# Patient Record
Sex: Male | Born: 1970 | Race: White | Hispanic: No | Marital: Married | Smoking: Current some day smoker
Health system: Southern US, Community
[De-identification: ages and names within clinical notes are randomized; demographics above are authoritative.]

## PROBLEM LIST (undated history)

## (undated) DIAGNOSIS — G8929 Other chronic pain: Secondary | ICD-10-CM

## (undated) DIAGNOSIS — C649 Malignant neoplasm of unspecified kidney, except renal pelvis: Secondary | ICD-10-CM

## (undated) DIAGNOSIS — T8859XA Other complications of anesthesia, initial encounter: Secondary | ICD-10-CM

## (undated) DIAGNOSIS — I442 Atrioventricular block, complete: Secondary | ICD-10-CM

## (undated) DIAGNOSIS — I96 Gangrene, not elsewhere classified: Secondary | ICD-10-CM

## (undated) DIAGNOSIS — E119 Type 2 diabetes mellitus without complications: Secondary | ICD-10-CM

## (undated) DIAGNOSIS — N186 End stage renal disease: Secondary | ICD-10-CM

## (undated) DIAGNOSIS — I110 Hypertensive heart disease with heart failure: Secondary | ICD-10-CM

## (undated) DIAGNOSIS — Z992 Dependence on renal dialysis: Secondary | ICD-10-CM

## (undated) DIAGNOSIS — E785 Hyperlipidemia, unspecified: Secondary | ICD-10-CM

## (undated) DIAGNOSIS — T4145XA Adverse effect of unspecified anesthetic, initial encounter: Secondary | ICD-10-CM

## (undated) DIAGNOSIS — M549 Dorsalgia, unspecified: Secondary | ICD-10-CM

## (undated) DIAGNOSIS — I5032 Chronic diastolic (congestive) heart failure: Secondary | ICD-10-CM

## (undated) DIAGNOSIS — I4892 Unspecified atrial flutter: Secondary | ICD-10-CM

## (undated) DIAGNOSIS — I639 Cerebral infarction, unspecified: Secondary | ICD-10-CM

## (undated) DIAGNOSIS — D649 Anemia, unspecified: Secondary | ICD-10-CM

## (undated) DIAGNOSIS — F319 Bipolar disorder, unspecified: Secondary | ICD-10-CM

## (undated) DIAGNOSIS — I350 Nonrheumatic aortic (valve) stenosis: Secondary | ICD-10-CM

## (undated) DIAGNOSIS — E669 Obesity, unspecified: Secondary | ICD-10-CM

## (undated) DIAGNOSIS — R569 Unspecified convulsions: Secondary | ICD-10-CM

## (undated) HISTORY — PX: NM MYOVIEW LTD: HXRAD82

## (undated) HISTORY — DX: Nonrheumatic aortic (valve) stenosis: I35.0

## (undated) HISTORY — DX: Hypertensive heart disease with heart failure: I11.0

## (undated) HISTORY — DX: Hyperlipidemia, unspecified: E78.5

## (undated) HISTORY — PX: HERNIA REPAIR: SHX51

## (undated) HISTORY — PX: TONSILLECTOMY AND ADENOIDECTOMY: SUR1326

## (undated) HISTORY — PX: TRANSTHORACIC ECHOCARDIOGRAM: SHX275

## (undated) HISTORY — PX: TESTICLE TORSION REDUCTION: SHX795

## (undated) HISTORY — DX: Chronic diastolic (congestive) heart failure: I50.32

## (undated) HISTORY — DX: Anemia, unspecified: D64.9

---
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
## 1999-09-05 ENCOUNTER — Emergency Department (HOSPITAL_COMMUNITY): Admission: EM | Admit: 1999-09-05 | Discharge: 1999-09-05 | Payer: Self-pay | Admitting: *Deleted

## 2000-11-02 ENCOUNTER — Emergency Department (HOSPITAL_COMMUNITY): Admission: EM | Admit: 2000-11-02 | Discharge: 2000-11-02 | Payer: Self-pay | Admitting: Emergency Medicine

## 2003-09-05 ENCOUNTER — Encounter (INDEPENDENT_AMBULATORY_CARE_PROVIDER_SITE_OTHER): Payer: Self-pay | Admitting: *Deleted

## 2003-09-05 ENCOUNTER — Ambulatory Visit (HOSPITAL_COMMUNITY): Admission: RE | Admit: 2003-09-05 | Discharge: 2003-09-05 | Payer: Self-pay

## 2003-09-05 ENCOUNTER — Ambulatory Visit (HOSPITAL_BASED_OUTPATIENT_CLINIC_OR_DEPARTMENT_OTHER): Admission: RE | Admit: 2003-09-05 | Discharge: 2003-09-05 | Payer: Self-pay

## 2003-09-05 ENCOUNTER — Encounter (INDEPENDENT_AMBULATORY_CARE_PROVIDER_SITE_OTHER): Payer: Self-pay

## 2005-12-01 ENCOUNTER — Emergency Department (HOSPITAL_COMMUNITY): Admission: EM | Admit: 2005-12-01 | Discharge: 2005-12-01 | Payer: Self-pay | Admitting: Emergency Medicine

## 2006-01-10 ENCOUNTER — Emergency Department (HOSPITAL_COMMUNITY): Admission: EM | Admit: 2006-01-10 | Discharge: 2006-01-10 | Payer: Self-pay | Admitting: Emergency Medicine

## 2006-07-01 HISTORY — PX: NEPHRECTOMY: SHX65

## 2006-10-13 ENCOUNTER — Ambulatory Visit: Payer: Self-pay | Admitting: Licensed Clinical Social Worker

## 2006-12-21 ENCOUNTER — Emergency Department (HOSPITAL_COMMUNITY): Admission: EM | Admit: 2006-12-21 | Discharge: 2006-12-21 | Payer: Self-pay | Admitting: Emergency Medicine

## 2006-12-22 ENCOUNTER — Inpatient Hospital Stay (HOSPITAL_COMMUNITY): Admission: EM | Admit: 2006-12-22 | Discharge: 2006-12-24 | Payer: Self-pay | Admitting: Emergency Medicine

## 2006-12-22 ENCOUNTER — Encounter: Payer: Self-pay | Admitting: *Deleted

## 2007-03-26 ENCOUNTER — Encounter (INDEPENDENT_AMBULATORY_CARE_PROVIDER_SITE_OTHER): Payer: Self-pay | Admitting: Urology

## 2007-03-26 ENCOUNTER — Inpatient Hospital Stay (HOSPITAL_COMMUNITY): Admission: RE | Admit: 2007-03-26 | Discharge: 2007-03-28 | Payer: Self-pay | Admitting: Urology

## 2008-01-24 ENCOUNTER — Emergency Department (HOSPITAL_COMMUNITY): Admission: EM | Admit: 2008-01-24 | Discharge: 2008-01-25 | Payer: Self-pay | Admitting: Emergency Medicine

## 2008-07-01 DIAGNOSIS — I110 Hypertensive heart disease with heart failure: Secondary | ICD-10-CM

## 2008-07-01 DIAGNOSIS — I5032 Chronic diastolic (congestive) heart failure: Secondary | ICD-10-CM

## 2008-07-01 HISTORY — DX: Chronic diastolic (congestive) heart failure: I50.32

## 2008-07-01 HISTORY — DX: Hypertensive heart disease with heart failure: I11.0

## 2008-12-20 ENCOUNTER — Ambulatory Visit: Payer: Self-pay | Admitting: Cardiovascular Disease

## 2008-12-20 ENCOUNTER — Encounter (INDEPENDENT_AMBULATORY_CARE_PROVIDER_SITE_OTHER): Payer: Self-pay | Admitting: Emergency Medicine

## 2008-12-20 ENCOUNTER — Inpatient Hospital Stay (HOSPITAL_COMMUNITY): Admission: EM | Admit: 2008-12-20 | Discharge: 2008-12-23 | Payer: Self-pay | Admitting: Emergency Medicine

## 2009-01-24 ENCOUNTER — Emergency Department (HOSPITAL_COMMUNITY): Admission: EM | Admit: 2009-01-24 | Discharge: 2009-01-24 | Payer: Self-pay | Admitting: Family Medicine

## 2010-01-29 ENCOUNTER — Emergency Department (HOSPITAL_COMMUNITY): Admission: EM | Admit: 2010-01-29 | Discharge: 2010-01-29 | Payer: Self-pay | Admitting: Family Medicine

## 2010-09-14 LAB — POCT URINALYSIS DIPSTICK
Ketones, ur: NEGATIVE mg/dL
pH: 5.5 (ref 5.0–8.0)

## 2010-10-08 LAB — BASIC METABOLIC PANEL
BUN: 18 mg/dL (ref 6–23)
CO2: 31 mEq/L (ref 19–32)
Calcium: 8.4 mg/dL (ref 8.4–10.5)
Calcium: 9 mg/dL (ref 8.4–10.5)
Chloride: 100 mEq/L (ref 96–112)
Creatinine, Ser: 1.47 mg/dL (ref 0.4–1.5)
GFR calc Af Amer: >60 mL/min (ref >60)
GFR calc Af Amer: >60 mL/min (ref >60)
GFR calc non Af Amer: 53 mL/min — ABNORMAL LOW (ref >60)
GFR calc non Af Amer: 54 mL/min — ABNORMAL LOW (ref >60)
Glucose, Bld: 131 mg/dL — ABNORMAL HIGH (ref 70–99)
Glucose, Bld: 196 mg/dL — ABNORMAL HIGH (ref 70–99)
Potassium: 3.1 mEq/L — ABNORMAL LOW (ref 3.5–5.1)
Potassium: 3.7 mEq/L (ref 3.5–5.1)
Sodium: 136 mEq/L (ref 135–145)
Sodium: 139 mEq/L (ref 135–145)

## 2010-10-08 LAB — GLUCOSE, CAPILLARY
Glucose-Capillary: 126 mg/dL — ABNORMAL HIGH (ref 70–99)
Glucose-Capillary: 129 mg/dL — ABNORMAL HIGH (ref 70–99)
Glucose-Capillary: 132 mg/dL — ABNORMAL HIGH (ref 70–99)
Glucose-Capillary: 132 mg/dL — ABNORMAL HIGH (ref 70–99)
Glucose-Capillary: 134 mg/dL — ABNORMAL HIGH (ref 70–99)
Glucose-Capillary: 134 mg/dL — ABNORMAL HIGH (ref 70–99)
Glucose-Capillary: 137 mg/dL — ABNORMAL HIGH (ref 70–99)
Glucose-Capillary: 139 mg/dL — ABNORMAL HIGH (ref 70–99)
Glucose-Capillary: 139 mg/dL — ABNORMAL HIGH (ref 70–99)
Glucose-Capillary: 140 mg/dL — ABNORMAL HIGH (ref 70–99)
Glucose-Capillary: 145 mg/dL — ABNORMAL HIGH (ref 70–99)
Glucose-Capillary: 146 mg/dL — ABNORMAL HIGH (ref 70–99)
Glucose-Capillary: 149 mg/dL — ABNORMAL HIGH (ref 70–99)
Glucose-Capillary: 156 mg/dL — ABNORMAL HIGH (ref 70–99)
Glucose-Capillary: 157 mg/dL — ABNORMAL HIGH (ref 70–99)
Glucose-Capillary: 161 mg/dL — ABNORMAL HIGH (ref 70–99)
Glucose-Capillary: 172 mg/dL — ABNORMAL HIGH (ref 70–99)
Glucose-Capillary: 173 mg/dL — ABNORMAL HIGH (ref 70–99)
Glucose-Capillary: 184 mg/dL — ABNORMAL HIGH (ref 70–99)
Glucose-Capillary: 184 mg/dL — ABNORMAL HIGH (ref 70–99)
Glucose-Capillary: 205 mg/dL — ABNORMAL HIGH (ref 70–99)
Glucose-Capillary: 207 mg/dL — ABNORMAL HIGH (ref 70–99)
Glucose-Capillary: 244 mg/dL — ABNORMAL HIGH (ref 70–99)
Glucose-Capillary: 245 mg/dL — ABNORMAL HIGH (ref 70–99)
Glucose-Capillary: 264 mg/dL — ABNORMAL HIGH (ref 70–99)
Glucose-Capillary: 265 mg/dL — ABNORMAL HIGH (ref 70–99)
Glucose-Capillary: 275 mg/dL — ABNORMAL HIGH (ref 70–99)
Glucose-Capillary: 300 mg/dL — ABNORMAL HIGH (ref 70–99)
Glucose-Capillary: 301 mg/dL — ABNORMAL HIGH (ref 70–99)
Glucose-Capillary: 307 mg/dL — ABNORMAL HIGH (ref 70–99)
Glucose-Capillary: 308 mg/dL — ABNORMAL HIGH (ref 70–99)
Glucose-Capillary: 320 mg/dL — ABNORMAL HIGH (ref 70–99)
Glucose-Capillary: 340 mg/dL — ABNORMAL HIGH (ref 70–99)
Glucose-Capillary: 372 mg/dL — ABNORMAL HIGH (ref 70–99)
Glucose-Capillary: 398 mg/dL — ABNORMAL HIGH (ref 70–99)
Glucose-Capillary: 416 mg/dL — ABNORMAL HIGH (ref 70–99)
Glucose-Capillary: 462 mg/dL — ABNORMAL HIGH (ref 70–99)
Glucose-Capillary: 477 mg/dL — ABNORMAL HIGH (ref 70–99)

## 2010-10-08 LAB — CBC
HCT: 38.5 % — ABNORMAL LOW (ref 39.0–52.0)
HCT: 39.6 % (ref 39.0–52.0)
Hemoglobin: 13.1 g/dL (ref 13.0–17.0)
Hemoglobin: 13.4 g/dL (ref 13.0–17.0)
Hemoglobin: 13.5 g/dL (ref 13.0–17.0)
Hemoglobin: 14.5 g/dL (ref 13.0–17.0)
MCHC: 33.9 g/dL (ref 30.0–36.0)
MCHC: 33.9 g/dL (ref 30.0–36.0)
MCHC: 34.4 g/dL (ref 30.0–36.0)
MCV: 84.9 fL (ref 78.0–100.0)
MCV: 85 fL (ref 78.0–100.0)
MCV: 85.8 fL (ref 78.0–100.0)
Platelets: 174 10*3/uL (ref 150–400)
Platelets: 183 10*3/uL (ref 150–400)
RBC: 4.53 MIL/uL (ref 4.22–5.81)
RBC: 4.61 MIL/uL (ref 4.22–5.81)
RBC: 4.65 MIL/uL (ref 4.22–5.81)
RDW: 13.6 % (ref 11.5–15.5)
RDW: 14.1 % (ref 11.5–15.5)
RDW: 14.1 % (ref 11.5–15.5)
WBC: 8.2 10*3/uL (ref 4.0–10.5)
WBC: 9.8 10*3/uL (ref 4.0–10.5)

## 2010-10-08 LAB — COMPREHENSIVE METABOLIC PANEL
ALT: 31 U/L (ref 0–53)
ALT: 32 U/L (ref 0–53)
AST: 17 U/L (ref 0–37)
AST: 28 U/L (ref 0–37)
Albumin: 3.2 g/dL — ABNORMAL LOW (ref 3.5–5.2)
Alkaline Phosphatase: 80 U/L (ref 39–117)
Alkaline Phosphatase: 82 U/L (ref 39–117)
BUN: 18 mg/dL (ref 6–23)
CO2: 28 mEq/L (ref 19–32)
CO2: 33 mEq/L — ABNORMAL HIGH (ref 19–32)
Calcium: 8.6 mg/dL (ref 8.4–10.5)
Chloride: 98 mEq/L (ref 96–112)
Creatinine, Ser: 1.49 mg/dL (ref 0.4–1.5)
GFR calc Af Amer: >60 mL/min (ref >60)
GFR calc Af Amer: >60 mL/min (ref >60)
GFR calc non Af Amer: 53 mL/min — ABNORMAL LOW (ref >60)
Glucose, Bld: 242 mg/dL — ABNORMAL HIGH (ref 70–99)
Glucose, Bld: 445 mg/dL — ABNORMAL HIGH (ref 70–99)
Potassium: 3.2 mEq/L — ABNORMAL LOW (ref 3.5–5.1)
Potassium: 4.8 mEq/L (ref 3.5–5.1)
Sodium: 134 mEq/L — ABNORMAL LOW (ref 135–145)
Sodium: 139 mEq/L (ref 135–145)
Total Bilirubin: 1.1 mg/dL (ref 0.3–1.2)
Total Protein: 5.7 g/dL — ABNORMAL LOW (ref 6.0–8.3)
Total Protein: 6.1 g/dL (ref 6.0–8.3)

## 2010-10-08 LAB — POCT CARDIAC MARKERS
CKMB, poc: 2 ng/mL (ref 1.0–8.0)
CKMB, poc: 2.4 ng/mL (ref 1.0–8.0)
Myoglobin, poc: 75.2 ng/mL (ref 12–200)
Myoglobin, poc: 77.5 ng/mL (ref 12–200)
Troponin i, poc: <0.05 ng/mL (ref 0.00–0.09)
Troponin i, poc: <0.05 ng/mL (ref 0.00–0.09)

## 2010-10-08 LAB — CARDIAC PANEL(CRET KIN+CKTOT+MB+TROPI)
CK, MB: 2.2 ng/mL (ref 0.3–4.0)
CK, MB: 2.4 ng/mL (ref 0.3–4.0)
Relative Index: INVALID (ref 0.0–2.5)
Relative Index: INVALID (ref 0.0–2.5)
Total CK: 41 U/L (ref 7–232)
Total CK: 42 U/L (ref 7–232)
Troponin I: 0.08 ng/mL — ABNORMAL HIGH (ref 0.00–0.06)
Troponin I: 0.09 ng/mL — ABNORMAL HIGH (ref 0.00–0.06)

## 2010-10-08 LAB — POCT I-STAT, CHEM 8
BUN: 22 mg/dL (ref 6–23)
Calcium, Ion: 1.06 mmol/L — ABNORMAL LOW (ref 1.12–1.32)
Chloride: 98 mEq/L (ref 96–112)
Creatinine, Ser: 1.4 mg/dL (ref 0.4–1.5)
Glucose, Bld: 427 mg/dL — ABNORMAL HIGH (ref 70–99)
HCT: 42 % (ref 39.0–52.0)
Hemoglobin: 14.3 g/dL (ref 13.0–17.0)
Potassium: 3.4 mEq/L — ABNORMAL LOW (ref 3.5–5.1)
Sodium: 133 mEq/L — ABNORMAL LOW (ref 135–145)
TCO2: 26 mmol/L (ref 0–100)

## 2010-10-08 LAB — LIPID PANEL
Cholesterol: 250 mg/dL — ABNORMAL HIGH (ref 0–200)
HDL: 37 mg/dL — ABNORMAL LOW (ref >39)
LDL Cholesterol: 150 mg/dL — ABNORMAL HIGH (ref 0–99)
Total CHOL/HDL Ratio: 6.8 RATIO
Triglycerides: 315 mg/dL — ABNORMAL HIGH (ref <150)
VLDL: 63 mg/dL — ABNORMAL HIGH (ref 0–40)

## 2010-10-08 LAB — TROPONIN I: Troponin I: 0.11 ng/mL — ABNORMAL HIGH (ref 0.00–0.06)

## 2010-10-08 LAB — APTT: aPTT: 26 seconds (ref 24–37)

## 2010-10-08 LAB — CK TOTAL AND CKMB (NOT AT ARMC)
CK, MB: 3.2 ng/mL (ref 0.3–4.0)
Relative Index: INVALID (ref 0.0–2.5)
Total CK: 58 U/L (ref 7–232)

## 2010-10-08 LAB — DIFFERENTIAL
Basophils Absolute: 0.1 10*3/uL (ref 0.0–0.1)
Basophils Relative: 1 % (ref 0–1)
Lymphocytes Relative: 24 % (ref 12–46)

## 2010-10-08 LAB — PROTIME-INR
INR: 1 (ref 0.00–1.49)
Prothrombin Time: 12.8 seconds (ref 11.6–15.2)

## 2010-10-08 LAB — HEMOGLOBIN A1C: Hgb A1c MFr Bld: 11.5 % — ABNORMAL HIGH (ref 4.6–6.1)

## 2010-10-08 LAB — TSH: TSH: 2.983 u[IU]/mL (ref 0.350–4.500)
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
## 2010-11-13 NOTE — Discharge Summary (Signed)
NAME:  Hayden [name], Hayden [name] NO.:  [id number]   MEDICAL RECORD NO.:  [id number]          PATIENT TYPE:  INP   LOCATION:  4706                         FACILITY:  [facility]   PHYSICIAN:  Altha Harm, MDDATE OF BIRTH:  1970-10-30   DATE OF ADMISSION:  12/20/2008  DATE OF DISCHARGE:  12/22/2008                               DISCHARGE SUMMARY   DISCHARGE DISPOSITION:  Home.   FINAL DISCHARGE DIAGNOSES:  1. Malignant hypertension, improved.  2. Diabetes type 2, uncontrolled.  3. Hypertensive urgency.  4. Hyperlipidemia.  5. Obesity.  6. Anxiety.  7. Chronic systolic and diastolic congestive heart failure.   DISCHARGE MEDICATIONS:  Include the following:  1. Hydrochlorothiazide 25 mg p.o. daily.  2. Pravastatin 20 mg p.o. every night.  3. Lantus 34 units subcu every night.  4. Metoprolol 100 mg p.o. b.i.d.  5. Potassium chloride 20 mEq p.o. daily.  6. Amaryl 2 mg p.o. daily.  7. Lisinopril 10 mg p.o. daily.   PROCEDURES:  None.   DIAGNOSTIC STUDIES:  1. A two-view chest is created on admission which showed mild      cardiomegaly and mild diffuse interstitial pulmonary edema      consistent with CHF.  2. Echocardiogram which shows left ventricle with mild dilation, an      ejection fraction of 40%-45% with inferolateral hypokinesis and      wall thickness increased in a pattern of moderate LDH.   PROCEDURES:  None.   PERTINENT LABORATORY STUDIES:  Hemoglobin A1c elevated 11.5, total  cholesterol 250, LDL 150, VLDL 63, HDL 37.  At the time of discharge,  patient had a sodium of 139, potassium of 3.7, chloride 98, bicarb 31,  BUN 16, creatinine 1.5.   CHIEF COMPLAINT:  Shortness of breath.   HISTORY OF PRESENT ILLNESS:  Please refer to the H and P dictated by Dr.  Oralia Rud for details of the HPI.   HOSPITAL COURSE:  1. Diabetes type 2, uncontrolled.  The patient was admitted and placed      on an insulin drip.  Medications were titrated until the patient    was able to come off the insulin drip with  blood sugars ranging in      the 100s to 160s.  The patient was then started on Lantus and      NovoLog insulin.  Amaryl was also added for pre-meal control.  The      patient had been on metformin; however, due to his impaired renal      function, the metformin was discontinued.  The patient will need      further titration as an outpatient.  He is to see diabetic      education here in the hospital and instructions on how to      administer his insulin.  The patient has also been given a      prescription for Glucometer and test strips.  He has been      instructed to check his blood sugars in the morning.  2. Hypertensive urgency.  The patient came in with malignant  hypertension and hypertensive urgency with headache and chest pain.      He was ruled out with serial enzymes for resting ischemia and had      medications titrated.  The patient was put on labetalol,      hydrochlorothiazide, lisinopril here in the hospital and is being      discharged on the above-stated medications.  3. Chronic systolic and diastolic dysfunction.  A 2-D echocardiogram      showed the patient had an ejection fraction of 40%-47% and features      consistent with left ventricular hypertrophy.  The patient is put      on the above-stated medications and he will follow up with      cardiologist, Dr. Shana Chute, who he has chosen to be both his primary      care physician and his cardiologist.  4. Hyperlipidemia.  The patient was found to be hyperlipidemic and      started on pravastatin.  Baseline LFTs were drawn, which were      normal at this time.  Otherwise, the patient remains stable in the      hospital.   DIETARY RESTRICTIONS:  The patient should be on a diabetic, low-  cholesterol, heart-healthy diet.   PHYSICAL RESTRICTIONS:  Activity as tolerated.   FOLLOWUP:  The patient is to follow up with Dr. Shana Chute in 1 week.  Number has been provided,  [phone], and the patient has spoken with Dr.  Shana Chute this afternoon.      Altha Harm, MD  Electronically Signed     MAM/MEDQ  D:  12/23/2008  T:  12/23/2008  Job:  [id number]   cc:   Osvaldo Shipper. Spruill, M.D.
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
## 2010-11-13 NOTE — H&P (Signed)
NAME:  Hayden [name], PURK NO.:  [id number]   MEDICAL RECORD NO.:  [id number]          PATIENT TYPE:  INP   LOCATION:  1826                         FACILITY:  [facility]   PHYSICIAN:  Darryl D. Prime, MD    DATE OF BIRTH:  [birth year]   DATE OF ADMISSION:  12/20/2008  DATE OF DISCHARGE:                              HISTORY & PHYSICAL   The patient is full code.   PRIMARY CARE PHYSICIAN:  Erskine Speed, M.D.   CHIEF COMPLAINT:  Shortness of breath.   HISTORY OF PRESENT ILLNESS:  Mr. Daughety is a 40 year old male with a  history of long-standing hypertension at an early age.  He has been  extensively worked up by this by Dr. Chilton Si and was told that this  possibly genetic.  He has a history of diabetes for the last 2 years but  has not been checking his blood sugars.  His blood pressure is labile.  He comes in with complaints of persistent orthopnea, worse during the  night.  The symptoms were so bad tonight he had to come in.  He denies  any chest pain, recent fever, cough.  He denies any weight gain, denies  any lower extremity edema but the lower extremity edema is visible.  The  patient has taken nothing for this, he just decided to come in.   In the emergency room, he was found to have blood pressures in the range  of 228/137.  Lasix IV was given as well as nitroglycerin drip.  The  patient's blood sugars were in the range of 477 and Glucommander was  started.   PAST MEDICAL HISTORY:  As above.  He has a history of:  1. Hyperlipidemia.  2. Depression.  3. 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  He is      status post partial nephrectomy in September 2008 for that.  4. History of testicular torsion, status post bilateral orchidopexy at      age 12.  5. History of inguinal hernia repair.  6. History of left supraclavicular lymph node removal, found to be      benign.   ALLERGIES:  DILAUDID AND MORPHINE which cause delirium.   MEDICATIONS:  1. Metformin 500 mg twice a day.  2. Diovan/hydrochlorothiazide, unsure of dose.  3. Cardura, unsure of dose.  4. Xanax 0.25 mg twice a day p.r.n.   SOCIAL HISTORY:  The patient is married, has 2 children, including a son  and daughter.  He works in English as a second language teacher for [employer].  Has smoked at least 2 packs per day for about 9 years. No alcohol use.   FAMILY HISTORY:  Positive for multiple uncles with uncontrolled  hypertension on his mother's side.  One uncle on his mother's side at  age 17 had a myocardial infarction and one at age 57 had a myocardial  infarction.   REVIEW OF SYSTEMS:  A 14-point review of systems is negative unless  stated above.   PHYSICAL EXAMINATION:  VITAL SIGNS:  Patient is afebrile, blood pressure  228/137, pulse 114, respirations 26, saturations  are 94% on room air.  HEENT:  Normocephalic, atraumatic.  Pupils are equal, round and reactive  to light. Extraocular movements intact.  Oropharynx is dry.  NECK:  Supple, no lymphadenopathy or thyromegaly, jugular venous  distention is to the jaw.  LUNGS:  Show bilateral inspiratory crackles.  CARDIOVASCULAR:  Distant, fast heart rate.  ABDOMEN:  Soft, obese, nontender, nondistended with no  hepatosplenomegaly.  EXTREMITIES:  No clubbing, cyanosis.  He has 1+ bilateral lower  extremity edema.  NEUROLOGIC:  He is alert and oriented x4.  Cranial nerves II through XII  grossly intact.  Strength and sensation are grossly intact.  SKIN:  No rashes or ulcers.   LABORATORY DATA:  EKG shows sinus tachycardia at a ventricular rate of  109 beats per minute with heart rate increase compared to September  2008.  He did have LVH on that EKG as well and LVH on this EKG.  Chest x-  ray shows cardiomegaly which is significant with associated pulmonary  edema.  The heart silhouette looks worse as compared to September 2008  and there is new pulmonary edema.   His cardiac markers at 5:54 were unremarkable as well  as at 4:53.  B-  type natriuretic peptide was 327. CBC white count is 8.1 with a  hematocrit of 42.2, hemoglobin 14.5 and platelet count of 169,000, segs  are 68.  I-stat shows a CO2 of 26, glucose of 427, chloride 98, BUN 22,  creatinine 1.4.  Potassium 3.4, sodium 133.   ASSESSMENT AND PLAN:  This is a patient with a history of significant  hypertension who now presents with new onset heart failure. It could be  related to hypertensive heart disease with associated diastolic  dysfunction but he will have to be ruled out for acute coronary syndrome  and may have to see cardiology at some point for possible cardiac  catheterization.  At this time, he will be admitted to the CCU for  diuresis.  He will be on nitrites to decrease afterload and preload.  When he is optimally volemic, he can be started on beta blockade and ACE  inhibitors.  The patient's hyperglycemia will be controlled with  Glucommander.  He will need possible education concerning control and  for his diabetes mellitus as well.  GI and DVT prophylaxis will be  ordered.      Darryl D. Prime, MD  Electronically Signed     DDP/MEDQ  D:  12/20/2008  T:  12/20/2008  Job:  [id number]

## 2010-11-13 NOTE — Op Note (Signed)
NAME:  Hayden [name], Hayden [name] NO.:  [id number]   MEDICAL RECORD NO.:  [id number]          PATIENT TYPE:  INP   LOCATION:  1423                         FACILITY:  [facility]   PHYSICIAN:  Heloise Purpura, MD      DATE OF BIRTH:  Jan 17, 1971   DATE OF PROCEDURE:  03/26/2007  DATE OF DISCHARGE:                               OPERATIVE REPORT   PREOPERATIVE DIAGNOSIS:  1. Right renal mass.  2. Hypertension.   POSTOPERATIVE DIAGNOSES:  1. Right renal mass.  2. Hypertension.   PROCEDURES:  1. Right robotic assisted laparoscopic partial nephrectomy.  2. Laparoscopic adrenal biopsy.   SURGEON:  Heloise Purpura, M.D.   ASSISTANT:  Tarri Glenn, M.D.   ANESTHESIA:  General.   COMPLICATIONS:  None.   ESTIMATED BLOOD LOSS:  100 mL.   WARM ISCHEMIA TIME:  24 minutes.   SPECIMENS:  1. Frozen section analysis of the deep renal margin which was negative      intraoperatively.  2. Right renal mass for permanent analysis.  3. Adrenal biopsy for permanent analysis.   DISPOSITION OF SPECIMENS:  To pathology.   DRAINS:  1. A number 15 Blake perinephric drain.  2. A 16 French Foley catheter.   INDICATION:  Mr. Rockers is a 40 year old gentleman who was found to have  an incidentally detected right renal mass that was enhancing and  concerning for a possible renal malignancy.  He underwent a metastatic  evaluation which was negative and after discussing options, elected to  proceed with a minimally invasive partial nephrectomy.  In addition, the  patient has poorly controlled hypertension and despite undergoing a  negative biochemical evaluation by his primary care physician, Dr. Elmore Guise, it was requested by Dr. Chilton Si that he have adrenal biopsy to  further to help diagnose the etiology for his poorly controlled  hypertension.  The potential risks, complications and alternative  options were discussed with the patient and informed consent was  obtained.   DESCRIPTION OF  PROCEDURE:  Mr. Heckmann was taken to the operating room  and a general anesthetic was administered.  He was given preoperative  antibiotics and placed in the right modified flank position and prepped  and draped in the usual sterile fashion.  Care was taken to pad all  potential pressure points.  A preoperative time out was performed.  A  site was then selected lateral to the umbilicus on the right side for  placement of the camera port.  This was placed using a standard open  Hassan technique.  This allowed entry into the peritoneal cavity under  direct vision without difficulty.  A 12 mm port was then placed and a  pneumoperitoneum was established.  The abdomen was inspected and there  was no evidence of any intra-abdominal injuries or other abnormalities.  8 mm robotic ports were then placed in the right upper quadrant, right  mid lower quadrant, and right lateral lower quadrant.  All ports were  placed under direct vision without difficulty.  An additional 12 mm port  was placed between the camera port and the right upper  quadrant port for  laparoscopic assistance.  The surgical cart was then docked.   With the aid of the cautery scissors, the white line of T[place]t was  incised along the ascending colon allowing the colon to be mobilized  medially.  The duodenum was similarly mobilized medially utilizing a  Kocher maneuver.  This exposed the inferior vena cava.  Along the  inferior aspect of the kidney, the ureter was identified and was lifted  off the psoas muscle.  Dissection proceeded superiorly along the  inferior vena cava until the main renal vein was identified.  The renal  vein was isolated and dissected free.  Using blunt and sharp dissection  as necessary, the main renal artery, which was located just posterior to  the renal vein, with was isolated.  12.5 grams of intravenous mannitol  was administered.  Attention was then turned to the lower pole of the  kidney where a 2.1 cm  mass was noted on the patient's preoperative  imaging.  The perinephric fat overlying the lower pole of the kidney was  cleared off and the renal mass was identified.  There was noted to be  adherent fat surrounding the area of the renal mass with the fat being  adherent to the renal capsule.   Once the mass was exposed and isolated, the renal artery was clamped  with a laparoscopic bulldog clamp.  The mass was then excised with sharp  cold scissor dissection and a deep parenchymal margin was excised and  sent for frozen section analysis and turned out to be negative for  malignancy.  4-0 Vicryl sutures were then used to ligate small  intrarenal vessels.  The Argon beam coagulator was used to coagulate the  renal parenchymal surface on the edges as well as deeply throughout the  tumor resection.  A Surgicel bolster was then placed over FloSeal into  the renal defect and it was secured with compressive 2-0 Vicryl sutures  which were locked in place with lap ties.  The laparoscopic bulldog  clamp was then removed and an additional 12.5 g of IV mannitol was  given.  Hemostasis appeared excellent.  Total warm ischemia time was 24  minutes.  The overlying perinephric fat was reapproximated and attention  then turned to the upper pole of the kidney.   The adrenal gland was identified just above the superior aspect of the  right kidney.  It was isolated and a small portion of the adrenal gland  was excised with scissors.  It was sent for permanent pathologic  analysis.  The raw surface of the adrenal gland was then controlled  hemostatically with FloSeal and Surgicel which did result in excellent  hemostasis.  The pneumoperitoneum was let down and both the renal defect  as well as the adrenal gland continued to demonstrate excellent  hemostasis.   A #15 Blake drain was then brought through the right lower quadrant  lateral port and positioned in the perinephric space.  It was secured to  the  skin with a nylon suture.  The surgical cart was then undocked.  The  renal mass had been placed into an Endopouch retrieval bag and was  removed through the [place] port site.  0 Vicryl sutures were placed  through the rectus fascia at both 12 mm port sites for closure of these  particular port sites.  The 8 mm ports were removed under direct vision.  The pneumoperitoneum was let down and all port sites were injected with  0.25%  Marcaine.  The skin was reapproximated with staples.  Sterile dressings  were applied.  The patient appeared to tolerate the procedure well  without complications.  He was able to be extubated and transferred to  the recovery unit in satisfactory condition.      Heloise Purpura, MD  Electronically Signed     LB/MEDQ  D:  03/26/2007  T:  03/26/2007  Job:  [id number]

## 2010-11-16 NOTE — Discharge Summary (Signed)
NAME:  Hayden [name], Hayden [name] NO.:  [id number]   MEDICAL RECORD NO.:  [id number]          PATIENT TYPE:  INP   LOCATION:  1423                         FACILITY:  [facility]   PHYSICIAN:  Heloise Purpura, MD      DATE OF BIRTH:  [birth year]   DATE OF ADMISSION:  03/26/2007  DATE OF DISCHARGE:  03/28/2007                               DISCHARGE SUMMARY   ADMISSION DIAGNOSES:  1. Right renal mass.  2. Hypertension.   DISCHARGE DIAGNOSES:  1. Renal cell carcinoma.  2. Hypertension.   HISTORY AND PHYSICAL:  For full details, please see admission history  and physical.  Briefly, [name] is a 40 year old gentleman who was  found to have an incidentally-detected 2.1 cm enhancing right renal mass  found on imaging performed after a recent accident.  He underwent a  metastatic evaluation which was negative and after discussing options,  elected to proceed with a minimally-invasive partial nephrectomy.   HOSPITAL COURSE:  On March 26, 2007, the patient was taken to the  operating room and underwent a right robotic-assisted laparoscopic  partial nephrectomy.  He tolerated this procedure well and without  complications.  At the request of his primary care physician, Dr. Elmore Guise, an adrenal biopsy was also performed for reasons related to his  uncontrolled hypertension.  On postoperative day #1, the patient was  able to begin ambulating, which he did without difficulty.  He remained  hemodynamically stable and his hematocrit was checked on postoperative  day #1 and found to be consistently stable at 41.0.  His serum  creatinine was 1.21.  His diet was gradually advanced on postoperative  day #1, when he did have return of bowel function.  His pain control was  initially managed with intravenous pain medication and subsequently  transitioned to oral pain medication.  He maintained very little output  from his perinephric drain and it was able to be removed on  postoperative day  #2.  The patient otherwise met all discharge criteria  and was discharged home on postoperative day #2.   DISPOSITION:  Home.   DISCHARGE MEDICATIONS:  Mr. Pender was instructed to resume his regular  home medications except any aspirin, nonsteroidal anti-inflammatory  drugs, or herbal supplements.  He was given a prescription to take  Percocet as needed for pain and to use Colace as a stool softener.   DISCHARGE INSTRUCTIONS:  The patient was instructed to be ambulatory but  specifically told to refrain from any heavy lifting, strenuous activity,  or driving.  He was told to resume a regular diabetic diet and was  instructed on the signs and symptoms of wound infection and told to call  should he have any problems.   FOLLOW-UP:  Mr. 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  He  understands that he will require cancer surveillance.  He will follow up  in the next 2 weeks for staple removal and to discuss his surveillance  schedule.      Heloise Purpura, MD  Electronically Signed  LB/MEDQ  D:  03/31/2007  T:  03/31/2007  Job:  [id number]

## 2010-11-16 NOTE — Op Note (Signed)
NAME:  Hayden [name], Hayden [name] NO.:  [id number]   MEDICAL RECORD NO.:  [id number]                   PATIENT TYPE:  AMB   LOCATION:  DSC                                  FACILITY:  [facility]   PHYSICIAN:  Lorre Munroe., M.D.            DATE OF BIRTH:  1971-01-29   DATE OF PROCEDURE:  09/05/2003  DATE OF DISCHARGE:                                 OPERATIVE REPORT   PREOPERATIVE DIAGNOSIS:  Left supraclavicular lymphadenopathy.   POSTOPERATIVE DIAGNOSIS:  Left supraclavicular lymphadenopathy.   OPERATION:  Excision of left supraclavicular lymph node.   SURGEON:  Lebron Conners, M.D.   ANESTHESIA:  General.   DESCRIPTION OF PROCEDURE:  After the patient was monitored and anesthetized  and had routine preparation and draping of the left side of the neck, I made  a 3 cm incision paralleling the clavicle right over the palpable lymph node.  I dissected down through the subcutaneous tissues and incised the platysma  and got good hemostasis.  I noted a nerve running through the midportion of  the incision and left it intact.  I dissected posterior to it and directly  down to the palpably enlarged lymph node lying in the very deep space in  that area.  I grasped the node and excised it and got good hemostasis with  the cautery.  I closed the deep tissues with 4-0 Vicryl and closed the skin  with intracuticular 4-0 Vicryl and Steri-Strips.  The patient was stable  through the procedure.                                               Lorre Munroe., M.D.    Jodi Marble  D:  09/05/2003  T:  09/06/2003  Job:  [id number]

## 2011-04-11 LAB — BASIC METABOLIC PANEL
BUN: 11
BUN: 15
Calcium: 8.8
Calcium: 9.3
Creatinine, Ser: 1.12
GFR calc Af Amer: >60
GFR calc non Af Amer: 56 — ABNORMAL LOW
GFR calc non Af Amer: >60
GFR calc non Af Amer: >60
Glucose, Bld: 144 — ABNORMAL HIGH
Potassium: 4
Sodium: 137
Sodium: 138

## 2011-04-11 LAB — CBC
Platelets: 237
RBC: 5.19
WBC: 9.3

## 2011-04-11 LAB — URINALYSIS, ROUTINE W REFLEX MICROSCOPIC
Nitrite: NEGATIVE
Specific Gravity, Urine: 1.018
pH: 6.5

## 2011-04-11 LAB — TYPE AND SCREEN
ABO/RH(D): A NEG
Antibody Screen: NEGATIVE

## 2011-04-11 LAB — CREATININE, URINE, RANDOM: Creatinine, Urine: <10

## 2011-04-11 LAB — ABO/RH: ABO/RH(D): A NEG

## 2011-04-11 LAB — HEMOGLOBIN AND HEMATOCRIT, BLOOD: Hemoglobin: 14.3

## 2011-04-17 LAB — COMPREHENSIVE METABOLIC PANEL
Albumin: 3.6
BUN: 13
Calcium: 9.1
Creatinine, Ser: 1.23
Potassium: 3.8
Total Protein: 6.6

## 2011-04-17 LAB — URINALYSIS, ROUTINE W REFLEX MICROSCOPIC
Bilirubin Urine: NEGATIVE
Specific Gravity, Urine: >1.046 — ABNORMAL HIGH
pH: 5.5

## 2011-04-17 LAB — BASIC METABOLIC PANEL
CO2: 31
Calcium: 8.7
Calcium: 8.7
Creatinine, Ser: 1.21
Creatinine, Ser: 1.21
GFR calc Af Amer: >60
GFR calc Af Amer: >60
GFR calc non Af Amer: >60
GFR calc non Af Amer: >60
Glucose, Bld: 130 — ABNORMAL HIGH
Sodium: 141
Sodium: 142

## 2011-04-17 LAB — CBC
HCT: 44
Hemoglobin: 13.4
Hemoglobin: 14.4
MCHC: 33.4
MCHC: 33.4
MCHC: 33.6
MCV: 83
Platelets: 248
RBC: 4.76
RDW: 13.1
RDW: 14.3 — ABNORMAL HIGH
WBC: 8.5

## 2011-04-17 LAB — SAMPLE TO BLOOD BANK

## 2011-04-17 LAB — HEMOGLOBIN A1C: Hgb A1c MFr Bld: 6.6 — ABNORMAL HIGH

## 2011-04-17 LAB — DIFFERENTIAL
Lymphocytes Relative: 20
Lymphs Abs: 2.2
Monocytes Absolute: 0.9 — ABNORMAL HIGH
Monocytes Relative: 8
Neutro Abs: 7.7

## 2011-04-17 LAB — URINE MICROSCOPIC-ADD ON

## 2012-02-10 ENCOUNTER — Encounter (HOSPITAL_BASED_OUTPATIENT_CLINIC_OR_DEPARTMENT_OTHER): Payer: Self-pay

## 2012-02-10 ENCOUNTER — Emergency Department (HOSPITAL_BASED_OUTPATIENT_CLINIC_OR_DEPARTMENT_OTHER): Payer: 59

## 2012-02-10 ENCOUNTER — Emergency Department (HOSPITAL_BASED_OUTPATIENT_CLINIC_OR_DEPARTMENT_OTHER)
Admission: EM | Admit: 2012-02-10 | Discharge: 2012-02-10 | Disposition: A | Discharge status: Home / Self Care | Payer: 59 | Attending: Emergency Medicine | Admitting: Emergency Medicine

## 2012-02-10 DIAGNOSIS — Z794 Long term (current) use of insulin: Secondary | ICD-10-CM | POA: Insufficient documentation

## 2012-02-10 DIAGNOSIS — Y998 Other external cause status: Secondary | ICD-10-CM | POA: Insufficient documentation

## 2012-02-10 DIAGNOSIS — F17200 Nicotine dependence, unspecified, uncomplicated: Secondary | ICD-10-CM | POA: Insufficient documentation

## 2012-02-10 DIAGNOSIS — IMO0002 Reserved for concepts with insufficient information to code with codable children: Secondary | ICD-10-CM | POA: Insufficient documentation

## 2012-02-10 DIAGNOSIS — I10 Essential (primary) hypertension: Secondary | ICD-10-CM | POA: Insufficient documentation

## 2012-02-10 DIAGNOSIS — I509 Heart failure, unspecified: Secondary | ICD-10-CM | POA: Insufficient documentation

## 2012-02-10 DIAGNOSIS — E119 Type 2 diabetes mellitus without complications: Secondary | ICD-10-CM | POA: Insufficient documentation

## 2012-02-10 DIAGNOSIS — X500XXA Overexertion from strenuous movement or load, initial encounter: Secondary | ICD-10-CM | POA: Insufficient documentation

## 2012-02-10 DIAGNOSIS — Z79899 Other long term (current) drug therapy: Secondary | ICD-10-CM | POA: Insufficient documentation

## 2012-02-10 DIAGNOSIS — Y9389 Activity, other specified: Secondary | ICD-10-CM | POA: Insufficient documentation

## 2012-02-10 DIAGNOSIS — S46911A Strain of unspecified muscle, fascia and tendon at shoulder and upper arm level, right arm, initial encounter: Secondary | ICD-10-CM

## 2012-02-10 MED ORDER — HYDROCODONE-ACETAMINOPHEN 5-325 MG PO TABS: 2.0000 | ORAL_TABLET | ORAL | Status: AC | PRN

## 2012-02-10 NOTE — ED Notes (Signed)
Injured right shoulder 2 days ago lifting 53yr old daughter

## 2012-02-10 NOTE — ED Provider Notes (Signed)
History     CSN: [id number]  Arrival date & time 02/10/12  1134   First MD Initiated Contact with Patient 02/10/12 1207      Chief Complaint  Patient presents with  . Shoulder Injury    (Consider location/radiation/quality/duration/timing/severity/associated sxs/prior treatment) Patient is a 41 y.o. male presenting with shoulder injury. The history is provided by the patient. No language interpreter was used.  Shoulder Injury This is a new problem. The current episode started today. The problem occurs constantly. The problem has been gradually worsening. Associated symptoms include joint swelling and myalgias. Nothing aggravates the symptoms. He has tried nothing for the symptoms. The treatment provided moderate relief.  Pt complains of pain to right shoulder after lifting his 37 year old daughter 2 days ago.  Past Medical History  Diagnosis Date  . History of kidney cancer   . Hypertension   . Diabetes mellitus   . CHF (congestive heart failure)   . Renal disorder     Past Surgical History  Procedure Date  . Nephrectomy   . Testicle torsion reduction     No family history on file.  History  Substance Use Topics  . Smoking status: Current Everyday Smoker  . Smokeless tobacco: Not on file  . Alcohol Use: Yes      Review of Systems  Musculoskeletal: Positive for myalgias and joint swelling.  All other systems reviewed and are negative.    Allergies  Dilaudid and Morphine and related  Home Medications   Current Outpatient Rx  Name Route Sig Dispense Refill  . CLONIDINE HCL PO Oral Take by mouth.    Marland Kitchen LASIX PO Oral Take by mouth.    Marland Kitchen HYDROCHLOROTHIAZIDE PO Oral Take by mouth.    . INSULIN GLARGINE 100 UNIT/ML [place] SOLN Subcutaneous Inject into the skin at bedtime.    Marland Kitchen METFORMIN HCL PO Oral Take by mouth.    Marland Kitchen POTASSIUM CHLORIDE PO Oral Take by mouth.    Marland Kitchen UNKNOWN TO PATIENT  Other BP meds-unsure of name    . VITAMIN D (CHOLECALCIFEROL) PO Oral Take by  mouth.      BP 194/95  Pulse 83  Temp 98.1 F (36.7 C) (Oral)  Resp 16  Ht 5\' 10"  (1.778 m)  Wt 255 lb (115.667 kg)  BMI 36.59 kg/m2  SpO2 95%  Physical Exam  Nursing note and vitals reviewed. Constitutional: He is oriented to person, place, and time. He appears well-developed and well-nourished.  HENT:  Head: Atraumatic.  Neck: Normal range of motion.  Musculoskeletal: He exhibits tenderness.       Tender right shoulder,  Pain with movement,  nv and ns intact  Neurological: He is alert and oriented to person, place, and time. He has normal reflexes.  Skin: Skin is warm.  Psychiatric: He has a normal mood and affect.    ED Course  Procedures (including critical care time)  Labs Reviewed - No data to display No results found.   1. Strain of shoulder, right          MDM  I suspect rotator cuff injury.   Pt advised to see Dr. Despina Hick for recheck this week.  Ice to area of swelling, Hydrocodone        Lonia Skinner [place], [place] 02/10/12 1252  Lonia Skinner [place], [place] 02/10/12 1255

## 2012-02-10 NOTE — Discharge Instructions (Signed)
Rotator Cuff Injury  The rotator cuff is the collective set of muscles and tendons that make up the stabilizing unit of your shoulder. This unit holds in the ball of the humerus (upper arm bone) in the socket of the scapula (shoulder blade). Injuries to this stabilizing unit most commonly come from sports or activities that cause the arm to be moved repeatedly over the head. Examples of this include throwing, weight lifting, swimming, racquet sports, or an injury such as falling on your arm. Chronic (longstanding) irritation of this unit can cause inflammation (soreness), bursitis, and eventual damage to the tendons to the point of rupture (tear). An acute (sudden) injury of the rotator cuff can result in a partial or complete tear. You may need surgery with complete tears. Small or partial rotator cuff tears may be treated conservatively with temporary immobilization, exercises and rest. Physical therapy may be needed.  HOME CARE INSTRUCTIONS    Apply ice to the injury for 15 to 20 minutes 3 to 4 times per day for the first 2 days. Put the ice in a plastic bag and place a towel between the bag of ice and your skin.   If you have a shoulder immobilizer (sling and straps), do not remove it for as long as directed by your caregiver or until you see a caregiver for a follow-up examination. If you need to remove it, move your arm as little as possible.   You may want to sleep on several pillows or in a recliner at night to lessen swelling and pain.   Only take over-the-counter or prescription medicines for pain, discomfort, or fever as directed by your caregiver.   Do simple hand squeezing exercises with a soft rubber ball to decrease hand swelling.  SEEK MEDICAL CARE IF:    Pain in your shoulder increases or new pain or numbness develops in your arm, hand, or fingers.   Your hand or fingers are colder than your other hand.  SEEK IMMEDIATE MEDICAL CARE IF:    Your arm, hand, or fingers are numb or  tingling.   Your arm, hand, or fingers are increasingly swollen and painful, or turn white or blue.  Document Released: 06/14/2000 Document Revised: 06/06/2011 Document Reviewed: 06/07/2008  [employer] Patient Information 2012 [employer].

## 2012-02-13 NOTE — ED Provider Notes (Signed)
Medical screening examination/treatment/procedure(s) were performed by non-physician practitioner and as supervising physician I was immediately available for consultation/collaboration.   Cyndra Numbers, MD 02/13/12 [phone]

## 2012-08-29 DIAGNOSIS — D649 Anemia, unspecified: Secondary | ICD-10-CM

## 2012-08-29 HISTORY — DX: Anemia, unspecified: D64.9

## 2012-09-20 DIAGNOSIS — Z794 Long term (current) use of insulin: Secondary | ICD-10-CM

## 2012-09-20 DIAGNOSIS — I509 Heart failure, unspecified: Secondary | ICD-10-CM | POA: Diagnosis present

## 2012-09-20 DIAGNOSIS — F17200 Nicotine dependence, unspecified, uncomplicated: Secondary | ICD-10-CM | POA: Diagnosis present

## 2012-09-20 DIAGNOSIS — Z885 Allergy status to narcotic agent status: Secondary | ICD-10-CM

## 2012-09-20 DIAGNOSIS — Z79899 Other long term (current) drug therapy: Secondary | ICD-10-CM

## 2012-09-20 DIAGNOSIS — Z905 Acquired absence of kidney: Secondary | ICD-10-CM

## 2012-09-20 DIAGNOSIS — E119 Type 2 diabetes mellitus without complications: Secondary | ICD-10-CM | POA: Diagnosis present

## 2012-09-20 DIAGNOSIS — N179 Acute kidney failure, unspecified: Secondary | ICD-10-CM | POA: Diagnosis present

## 2012-09-20 DIAGNOSIS — Z85528 Personal history of other malignant neoplasm of kidney: Secondary | ICD-10-CM

## 2012-09-20 DIAGNOSIS — I10 Essential (primary) hypertension: Principal | ICD-10-CM | POA: Diagnosis present

## 2012-09-20 NOTE — ED Notes (Signed)
Pt states he feels like he is having really bad indigestion. Pt state that he started having CP 2 hours ago. Pt states that the pain is center chest when it is strong he feels a cramping in his left arm. Pt states that it causes SOB, Nausea. Pt states BP always elevated.

## 2012-09-21 ENCOUNTER — Other Ambulatory Visit: Payer: Self-pay

## 2012-09-21 ENCOUNTER — Encounter (HOSPITAL_COMMUNITY): Payer: Self-pay | Admitting: Emergency Medicine

## 2012-09-21 ENCOUNTER — Emergency Department (HOSPITAL_COMMUNITY)
Admit: 2012-09-21 | Discharge: 2012-09-21 | Disposition: A | Discharge status: Home / Self Care | Payer: 59 | Attending: Emergency Medicine | Admitting: Emergency Medicine

## 2012-09-21 ENCOUNTER — Inpatient Hospital Stay (HOSPITAL_COMMUNITY)
Admission: EM | Admit: 2012-09-21 | Discharge: 2012-09-21 | DRG: 305 | Discharge status: Against Medical Advice | Payer: 59 | Attending: Internal Medicine | Admitting: Internal Medicine

## 2012-09-21 ENCOUNTER — Inpatient Hospital Stay (HOSPITAL_COMMUNITY)
Admission: EM | Admit: 2012-09-21 | Discharge: 2012-09-26 | DRG: 292 | Disposition: A | Discharge status: Home / Self Care | Payer: 59 | Attending: Internal Medicine | Admitting: Internal Medicine

## 2012-09-21 DIAGNOSIS — I161 Hypertensive emergency: Secondary | ICD-10-CM

## 2012-09-21 DIAGNOSIS — F419 Anxiety disorder, unspecified: Secondary | ICD-10-CM

## 2012-09-21 DIAGNOSIS — I130 Hypertensive heart and chronic kidney disease with heart failure and stage 1 through stage 4 chronic kidney disease, or unspecified chronic kidney disease: Principal | ICD-10-CM | POA: Diagnosis present

## 2012-09-21 DIAGNOSIS — I160 Hypertensive urgency: Secondary | ICD-10-CM | POA: Diagnosis present

## 2012-09-21 DIAGNOSIS — G4733 Obstructive sleep apnea (adult) (pediatric): Secondary | ICD-10-CM | POA: Diagnosis present

## 2012-09-21 DIAGNOSIS — N179 Acute kidney failure, unspecified: Secondary | ICD-10-CM

## 2012-09-21 DIAGNOSIS — I249 Acute ischemic heart disease, unspecified: Secondary | ICD-10-CM | POA: Diagnosis present

## 2012-09-21 DIAGNOSIS — Z905 Acquired absence of kidney: Secondary | ICD-10-CM

## 2012-09-21 DIAGNOSIS — Z6841 Body Mass Index (BMI) 40.0 and over, adult: Secondary | ICD-10-CM

## 2012-09-21 DIAGNOSIS — Z85528 Personal history of other malignant neoplasm of kidney: Secondary | ICD-10-CM

## 2012-09-21 DIAGNOSIS — R079 Chest pain, unspecified: Secondary | ICD-10-CM

## 2012-09-21 DIAGNOSIS — F17200 Nicotine dependence, unspecified, uncomplicated: Secondary | ICD-10-CM | POA: Diagnosis present

## 2012-09-21 DIAGNOSIS — R1013 Epigastric pain: Secondary | ICD-10-CM

## 2012-09-21 DIAGNOSIS — E1165 Type 2 diabetes mellitus with hyperglycemia: Secondary | ICD-10-CM | POA: Diagnosis present

## 2012-09-21 DIAGNOSIS — E119 Type 2 diabetes mellitus without complications: Secondary | ICD-10-CM | POA: Diagnosis present

## 2012-09-21 DIAGNOSIS — IMO0002 Reserved for concepts with insufficient information to code with codable children: Secondary | ICD-10-CM | POA: Diagnosis present

## 2012-09-21 DIAGNOSIS — N189 Chronic kidney disease, unspecified: Secondary | ICD-10-CM | POA: Diagnosis present

## 2012-09-21 DIAGNOSIS — E669 Obesity, unspecified: Secondary | ICD-10-CM | POA: Diagnosis present

## 2012-09-21 DIAGNOSIS — I10 Essential (primary) hypertension: Secondary | ICD-10-CM

## 2012-09-21 DIAGNOSIS — I200 Unstable angina: Secondary | ICD-10-CM | POA: Diagnosis present

## 2012-09-21 DIAGNOSIS — R748 Abnormal levels of other serum enzymes: Secondary | ICD-10-CM

## 2012-09-21 DIAGNOSIS — D649 Anemia, unspecified: Secondary | ICD-10-CM | POA: Diagnosis present

## 2012-09-21 DIAGNOSIS — I5022 Chronic systolic (congestive) heart failure: Secondary | ICD-10-CM | POA: Diagnosis present

## 2012-09-21 DIAGNOSIS — N184 Chronic kidney disease, stage 4 (severe): Secondary | ICD-10-CM | POA: Diagnosis present

## 2012-09-21 DIAGNOSIS — I517 Cardiomegaly: Secondary | ICD-10-CM | POA: Diagnosis present

## 2012-09-21 DIAGNOSIS — Z9119 Patient's noncompliance with other medical treatment and regimen: Secondary | ICD-10-CM

## 2012-09-21 DIAGNOSIS — Z91199 Patient's noncompliance with other medical treatment and regimen due to unspecified reason: Secondary | ICD-10-CM

## 2012-09-21 DIAGNOSIS — I509 Heart failure, unspecified: Secondary | ICD-10-CM | POA: Diagnosis present

## 2012-09-21 DIAGNOSIS — Z885 Allergy status to narcotic agent status: Secondary | ICD-10-CM

## 2012-09-21 DIAGNOSIS — G4730 Sleep apnea, unspecified: Secondary | ICD-10-CM | POA: Diagnosis present

## 2012-09-21 DIAGNOSIS — Z794 Long term (current) use of insulin: Secondary | ICD-10-CM

## 2012-09-21 HISTORY — DX: Obesity, unspecified: E66.9

## 2012-09-21 LAB — POCT I-STAT TROPONIN I: Troponin i, poc: 0.07 ng/mL (ref 0.00–0.08)

## 2012-09-21 LAB — CBC
Hemoglobin: 13.5 g/dL (ref 13.0–17.0)
MCH: 28.7 pg (ref 26.0–34.0)
MCHC: 35.4 g/dL (ref 30.0–36.0)
Platelets: 390 10*3/uL (ref 150–400)
RBC: 4.7 MIL/uL (ref 4.22–5.81)

## 2012-09-21 LAB — COMPREHENSIVE METABOLIC PANEL
ALT: 17 U/L (ref 0–53)
BUN: 38 mg/dL — ABNORMAL HIGH (ref 6–23)
CO2: 23 mEq/L (ref 19–32)
Calcium: 9.3 mg/dL (ref 8.4–10.5)
GFR calc Af Amer: 21 mL/min — ABNORMAL LOW (ref >90)
GFR calc non Af Amer: 18 mL/min — ABNORMAL LOW (ref >90)
Glucose, Bld: 366 mg/dL — ABNORMAL HIGH (ref 70–99)
Sodium: 134 mEq/L — ABNORMAL LOW (ref 135–145)
Total Protein: 7.5 g/dL (ref 6.0–8.3)

## 2012-09-21 LAB — BASIC METABOLIC PANEL
Calcium: 9.1 mg/dL (ref 8.4–10.5)
GFR calc non Af Amer: 17 mL/min — ABNORMAL LOW (ref >90)
Glucose, Bld: 191 mg/dL — ABNORMAL HIGH (ref 70–99)
Potassium: 3.4 mEq/L — ABNORMAL LOW (ref 3.5–5.1)
Sodium: 139 mEq/L (ref 135–145)

## 2012-09-21 LAB — CBC WITH DIFFERENTIAL/PLATELET
Eosinophils Absolute: 0.6 10*3/uL (ref 0.0–0.7)
Eosinophils Relative: 4 % (ref 0–5)
HCT: 39 % (ref 39.0–52.0)
Hemoglobin: 13.9 g/dL (ref 13.0–17.0)
Lymphocytes Relative: 24 % (ref 12–46)
Lymphs Abs: 3.8 10*3/uL (ref 0.7–4.0)
MCH: 28.1 pg (ref 26.0–34.0)
MCV: 78.9 fL (ref 78.0–100.0)
Monocytes Absolute: 1 10*3/uL (ref 0.1–1.0)
Monocytes Relative: 6 % (ref 3–12)
RBC: 4.94 MIL/uL (ref 4.22–5.81)
WBC: 16.1 10*3/uL — ABNORMAL HIGH (ref 4.0–10.5)

## 2012-09-21 MED ORDER — NITROGLYCERIN IN D5W 200-5 MCG/ML-% IV SOLN
2.0000 ug/min | Freq: Once | INTRAVENOUS | Status: AC
  Administered 2012-09-21: 5 ug/min via INTRAVENOUS
  Filled 2012-09-21: qty 250

## 2012-09-21 MED ORDER — ACETAMINOPHEN 650 MG RE SUPP: 650.0000 mg | Freq: Four times a day (QID) | RECTAL | Status: DC | PRN

## 2012-09-21 MED ORDER — ONDANSETRON HCL 4 MG/2ML IJ SOLN: 4.0000 mg | Freq: Three times a day (TID) | INTRAMUSCULAR | Status: DC | PRN

## 2012-09-21 MED ORDER — PANTOPRAZOLE SODIUM 40 MG IV SOLR
40.0000 mg | Freq: Once | INTRAVENOUS | Status: AC
  Administered 2012-09-21: 40 mg via INTRAVENOUS
  Filled 2012-09-21: qty 40

## 2012-09-21 MED ORDER — SODIUM CHLORIDE 0.9 % IV SOLN: INTRAVENOUS | Status: DC

## 2012-09-21 MED ORDER — METOPROLOL TARTRATE 100 MG PO TABS
100.0000 mg | ORAL_TABLET | Freq: Two times a day (BID) | ORAL | Status: DC
  Filled 2012-09-21 (×2): qty 4

## 2012-09-21 MED ORDER — CLONIDINE HCL 0.2 MG PO TABS
0.2000 mg | ORAL_TABLET | Freq: Once | ORAL | Status: AC
  Administered 2012-09-21: 0.2 mg via ORAL
  Filled 2012-09-21: qty 1

## 2012-09-21 MED ORDER — AMLODIPINE BESYLATE 10 MG PO TABS
10.0000 mg | ORAL_TABLET | Freq: Every day | ORAL | Status: DC
  Filled 2012-09-21: qty 1

## 2012-09-21 MED ORDER — SODIUM CHLORIDE 0.9 % IV BOLUS (SEPSIS)
1000.0000 mL | Freq: Once | INTRAVENOUS | Status: AC
  Administered 2012-09-21: 1000 mL via INTRAVENOUS

## 2012-09-21 MED ORDER — CLONIDINE HCL 0.2 MG PO TABS
0.2000 mg | ORAL_TABLET | Freq: Two times a day (BID) | ORAL | Status: DC
  Filled 2012-09-21 (×2): qty 1

## 2012-09-21 MED ORDER — INSULIN ASPART 100 UNIT/ML ~~LOC~~ SOLN
10.0000 [IU] | Freq: Once | SUBCUTANEOUS | Status: AC
  Administered 2012-09-21: 10 [IU] via SUBCUTANEOUS
  Filled 2012-09-21: qty 1

## 2012-09-21 MED ORDER — NAPROXEN 375 MG PO TABS: 375.0000 mg | ORAL_TABLET | Freq: Two times a day (BID) | ORAL | Status: DC | PRN

## 2012-09-21 MED ORDER — SIMVASTATIN 10 MG PO TABS
10.0000 mg | ORAL_TABLET | Freq: Every day | ORAL | Status: DC
  Filled 2012-09-21: qty 1

## 2012-09-21 MED ORDER — INSULIN GLARGINE 100 UNIT/ML ~~LOC~~ SOLN
10.0000 [IU] | Freq: Every day | SUBCUTANEOUS | Status: DC
  Filled 2012-09-21: qty 0.1

## 2012-09-21 MED ORDER — SODIUM CHLORIDE 0.9 % IJ SOLN: 3.0000 mL | Freq: Two times a day (BID) | INTRAMUSCULAR | Status: DC

## 2012-09-21 MED ORDER — GI COCKTAIL ~~LOC~~
30.0000 mL | Freq: Once | ORAL | Status: AC
  Administered 2012-09-21: 30 mL via ORAL
  Filled 2012-09-21: qty 30

## 2012-09-21 MED ORDER — INSULIN ASPART 100 UNIT/ML ~~LOC~~ SOLN: 0.0000 [IU] | Freq: Three times a day (TID) | SUBCUTANEOUS | Status: DC

## 2012-09-21 MED ORDER — HYDRALAZINE HCL 20 MG/ML IJ SOLN: 10.0000 mg | Freq: Four times a day (QID) | INTRAMUSCULAR | Status: DC | PRN

## 2012-09-21 MED ORDER — HEPARIN (PORCINE) IN NACL 100-0.45 UNIT/ML-% IJ SOLN
1850.0000 [IU]/h | INTRAMUSCULAR | Status: DC
  Administered 2012-09-21: 1450 [IU]/h via INTRAVENOUS
  Administered 2012-09-22: 1850 [IU]/h via INTRAVENOUS
  Filled 2012-09-21 (×3): qty 250

## 2012-09-21 MED ORDER — ACETAMINOPHEN 325 MG PO TABS: 650.0000 mg | ORAL_TABLET | Freq: Four times a day (QID) | ORAL | Status: DC | PRN

## 2012-09-21 MED ORDER — HEPARIN SODIUM (PORCINE) 5000 UNIT/ML IJ SOLN
5000.0000 [IU] | Freq: Three times a day (TID) | INTRAMUSCULAR | Status: DC
  Filled 2012-09-21 (×3): qty 1

## 2012-09-21 MED ORDER — HEPARIN BOLUS VIA INFUSION
4000.0000 [IU] | Freq: Once | INTRAVENOUS | Status: AC
  Administered 2012-09-21: 4000 [IU] via INTRAVENOUS

## 2012-09-21 MED ORDER — LORAZEPAM 2 MG/ML IJ SOLN
2.0000 mg | Freq: Once | INTRAMUSCULAR | Status: AC
  Administered 2012-09-21: 2 mg via INTRAVENOUS
  Filled 2012-09-21: qty 1

## 2012-09-21 NOTE — Progress Notes (Signed)
Patient received from ED at 0520. Alert, oriented x4, appropriate judgement. RN called to room patient stated he wanted to leave against medical advice. Risk explained. Patient and patient's wife verbalized understanding. Lenny Pastel notified. AMA form signed and witnessed by Asencion Gowda RN. Patient's belongings collected and left unescorted from the room.   Rochele Pages, RN

## 2012-09-21 NOTE — ED Notes (Addendum)
Patient comes in complaining of headache and hypertension. States he was admitted last night for kidney failure and hypertension, and ended up leaving AMA. States he is not feeling any better. States he is also having chest tightness and some shortness of breath.

## 2012-09-21 NOTE — Progress Notes (Signed)
Informed of patients decision to leave AMA, spoke with patient in hallway as he was leaving, pleaded with him to stay and noted that several of his lab findings were VERY concerning especially as relates to his kidneys (creatinine of 3.8), as well as the fact that off the nitro gtt his blood pressure would likely rise in a matter of minutes back to his hypertensive crisis level!  The patient insisted on leaving AMA, and said he would come back to the ED if he felt worse.

## 2012-09-21 NOTE — Consult Note (Signed)
ANTICOAGULATION CONSULT NOTE - Initial Consult  Pharmacy Consult for Heparin Indication: chest pain/ACS  Allergies  Allergen Reactions  . Dilaudid (Hydromorphone Hcl) Other (See Comments)    Abnormal behavior  . Morphine And Related Itching    Patient Measurements: Height: 5\' 11"  (180.3 cm) Weight: 298 lb (135.172 kg) IBW/kg (Calculated) : 75.3 Heparin Dosing Weight: ~106kg  Vital Signs: Temp: 97.6 F (36.4 C) (03/24 2114) Temp src: Oral (03/24 2114) BP: 231/110 mmHg (03/24 2114) Pulse Rate: 96 (03/24 2114)  Labs:  Recent Labs  09/21/12 09/21/12 2120  HGB 13.9 13.5  HCT 39.0 38.1*  PLT 393 390  CREATININE 3.84* 4.03*    Estimated Creatinine Clearance: 34.2 ml/min (by C-G formula based on Cr of 4.03).   Medical History: Past Medical History  Diagnosis Date  . History of kidney cancer   . Hypertension   . Diabetes mellitus   . CHF (congestive heart failure)   . Renal disorder     Medications:  No anticoagulants pta  Assessment: 41yom to begin heparin for CP with positive troponin. Acute renal insufficiency noted. CBC wnl.  Goal of Therapy:  Heparin level 0.3-0.7 units/ml Monitor platelets by anticoagulation protocol: Yes   Plan:  1) Heparin bolus 4000 units x 1 2) Heparin gtt @ 1450 units/hr 3) 6 hour heparin level 4) Daily heparin level and CBC  Fredrik Rigger 09/21/2012,10:41 PM

## 2012-09-21 NOTE — Progress Notes (Signed)
Utilization review completed.  

## 2012-09-21 NOTE — ED Provider Notes (Signed)
History     CSN: [id number]  Arrival date & time 09/20/12  2351   None     Chief Complaint  Patient presents with  . Chest Pain    (Consider location/radiation/quality/duration/timing/severity/associated sxs/prior treatment) HPI IZAYA NETHERTON is a 42 y.o. male the past medical history significant for hypertension, diabetes, and congestive heart failure for which he was admitted to the ICU for presents with chest pain.  He points to his epigastrium, and says it's a burning sensation beneath "my solar plexus", it is worse after eating associated with left arm cramping, currently a 2/10, with no other alleviating or exacerbating factors. He denies any shortness of breath, fevers or chills, recent illnesses, vomiting or diarrhea. Patient says he has severe anxiety as well.  He's never had a cardiac catheterization or stress test, mother has a history of heart problems, these uncertain of what they are.   Past Medical History  Diagnosis Date  . History of kidney cancer   . Hypertension   . Diabetes mellitus   . CHF (congestive heart failure)   . Renal disorder     Past Surgical History  Procedure Laterality Date  . Nephrectomy    . Testicle torsion reduction      No family history on file.  History  Substance Use Topics  . Smoking status: Current Every Day Smoker  . Smokeless tobacco: Not on file  . Alcohol Use: Yes      Review of Systems At least 10pt or greater review of systems completed and are negative except where specified in the HPI.  Allergies  Dilaudid and Morphine and related  Home Medications   Current Outpatient Rx  Name  Route  Sig  Dispense  Refill  . CLONIDINE HCL PO   Oral   Take by mouth.         . Furosemide (LASIX PO)   Oral   Take by mouth.         Marland Kitchen HYDROCHLOROTHIAZIDE PO   Oral   Take by mouth.         . insulin glargine (LANTUS) 100 UNIT/ML injection   Subcutaneous   Inject into the skin at bedtime.         Marland Kitchen  METFORMIN HCL PO   Oral   Take by mouth.         Marland Kitchen POTASSIUM CHLORIDE PO   Oral   Take by mouth.         Marland Kitchen UNKNOWN TO PATIENT      Other BP meds-unsure of name         . VITAMIN D, CHOLECALCIFEROL, PO   Oral   Take by mouth.           BP 206/101  Pulse 102  Temp(Src) 97.9 F (36.6 C) (Oral)  Resp 14  SpO2 96%  Physical Exam  Nursing notes reviewed.  Electronic medical record reviewed. VITAL SIGNS:   Filed Vitals:   09/21/12 0322 09/21/12 0412 09/21/12 0441 09/21/12 0520  BP: 198/121 187/111 173/98 168/84  Pulse: 96 91 90 80  Temp:    97.8 F (36.6 C)  TempSrc:    Oral  Resp: 14 14 14 22   SpO2: 99% 98% 97% 92%   CONSTITUTIONAL: Awake, oriented x4, appears non-toxic, very anxious HENT: Atraumatic, normocephalic, oral mucosa pink and moist, airway patent. Nares patent without drainage. External ears normal. EYES: Conjunctiva clear, EOMI, PERRLA NECK: Trachea midline, non-tender, supple CARDIOVASCULAR: Tachycardia, Normal rhythm, No murmurs, rubs, gallops  PULMONARY/CHEST: Clear to auscultation, no rhonchi, wheezes, or rales. Symmetrical breath sounds. Non-tender. ABDOMINAL: Non-distended, obese, soft, non-tender - no rebound or guarding.  BS normal. NEUROLOGIC: Non-focal, moving all four extremities, no gross sensory or motor deficits. EXTREMITIES: No clubbing, cyanosis, or edema SKIN: Warm, Dry, No erythema, No rash   ED Course  Procedures (including critical care time)  Date: 09/22/2012  Rate: 128  Rhythm: normal sinus rhythm  QRS Axis: Left  Intervals: normal  ST/T Wave abnormalities: normal  Conduction Disutrbances: Left anterior fascicular block  Narrative Interpretation: Left ventricular hypertrophy, question septal infarct. Left anterior fascicular block. Question biatrial abnormalities - large, notched P wave lead II   Labs Reviewed  CBC WITH DIFFERENTIAL - Abnormal; Notable for the following:    WBC 16.1 (*)    Neutro Abs 10.6 (*)     Basophils Absolute 0.2 (*)    All other components within normal limits  COMPREHENSIVE METABOLIC PANEL - Abnormal; Notable for the following:    Sodium 134 (*)    Glucose, Bld 366 (*)    BUN 38 (*)    Creatinine, Ser 3.84 (*)    Albumin 3.4 (*)    Alkaline Phosphatase 123 (*)    GFR calc non Af Amer 18 (*)    GFR calc Af Amer 21 (*)    All other components within normal limits  GLUCOSE, CAPILLARY - Abnormal; Notable for the following:    Glucose-Capillary 329 (*)    All other components within normal limits  GLUCOSE, CAPILLARY - Abnormal; Notable for the following:    Glucose-Capillary 218 (*)    All other components within normal limits  POCT I-STAT TROPONIN I   Dg Chest 2 View  09/21/2012  *RADIOLOGY REPORT*  Clinical Data: Chest pain radiating down the left arm.  CHEST - 2 VIEW  Comparison: 12/20/2008.  Findings:  Cardiopericardial silhouette within normal limits. Mediastinal contours normal. Trachea midline.  No airspace disease or effusion.  IMPRESSION: No active cardiopulmonary disease.   Original Report Authenticated By: Andreas Newport, M.D.      1. Hypertensive emergency   2. Acute kidney failure   3. Epigastric pain   4. Anxiety   5. Acute kidney injury   6. Chest pain   7. DM2 (diabetes mellitus, type 2)   8. Essential hypertension, malignant       MDM  SEDALE JENIFER is a 42 y.o. male presenting with chest pain it initially sounds like acid reflux versus peptic ulcer disease. Patient's blood pressure is noted to be very high, he says he gets whitecoat hypertension and so was treated with Ativan to control his anxiety.  Review of labs, I think is high blood pressure reflects hypertensive emergency as his creatinine has tripled since the last time we had seen him.  My concern is that this epigastric pain is an anginal equivalent even though it has resolved at this point - he is also getting therapy for his high blood pressure in the way of nitroglycerin. Patient's EKG  is abnormal, troponin is within normal limits. Discussed admission with the patient, patient was initially okay with being admitted even know he said "I have things to do." I told him that his labs are very concerning for kidney damage secondary to his hypertension, but we need to get better control of his blood pressure.  Patient's blood pressure under control with IV nitroglycerin. Patient complaining about headache he is given some pain medicine to control that. Discussed with Dr. Julian Reil  for admission.   After being admitted to the hospital, patient was still in the emergency department - and left the hospital AGAINST MEDICAL ADVICE. I did not see the patient, he did talk to Dr. Julian Reil who attempted to stop the patient, and re\re emphasized his potential a serious condition. Patient said he would return for any worsening symptoms.          Jones Skene, MD 09/22/12 1323

## 2012-09-21 NOTE — H&P (Signed)
[name] History and Physical  Hayden [name]:[id number] DOB: [birth year] DOA: 09/21/2012  Referring physician: ED PCP: Pola Corn, MD  Specialists: None  Chief Complaint: Chest and L arm pain  HPI: [name] is a 42 y.o. male who presents with c/o chest pain, located in his center chest and epigastric area, 2/10 in severity, associated with left arm cramping.  Nothing seems to make it worse, associated with very elevated blood pressure in ED 249/137 chest pain resolved after GI cocktail and L arm cramping resolved after his BP was controlled with nitro gtt.  In the ED patient found to essentially be in hypertensive urgency, blood pressure was able to be controlled with nitro gtt.  His chest and arm pain completely resolved although the nitro gtt has given him a headache.  Hospitalist asked to admit.  Review of Systems: 12 systems reviewed and otherwise negative.  Past Medical History  Diagnosis Date  . History of kidney cancer   . Hypertension   . Diabetes mellitus   . CHF (congestive heart failure)   . Renal disorder    Past Surgical History  Procedure Laterality Date  . Nephrectomy    . Testicle torsion reduction     Social History:  reports that he has been smoking.  He does not have any smokeless tobacco history on file. He reports that  drinks alcohol. He reports that he does not use illicit drugs.   Allergies  Allergen Reactions  . Dilaudid (Hydromorphone Hcl) Other (See Comments)    Abnormal behavior  . Morphine And Related Itching    No family history on file.   Prior to Admission medications   Medication Sig Start Date End Date Taking? Authorizing Provider  amLODipine (NORVASC) 10 MG tablet Take 10 mg by mouth daily.   Yes Historical Provider, MD  cloNIDine (CATAPRES) 0.2 MG tablet Take 0.2 mg by mouth 2 (two) times daily.   Yes Historical Provider, MD  furosemide (LASIX) 40 MG tablet Take 40 mg by mouth daily.   Yes Historical Provider,  MD  insulin glargine (LANTUS) 100 UNIT/ML injection Inject 45 Units into the skin daily.    Yes Historical Provider, MD  losartan (COZAAR) 100 MG tablet Take 100 mg by mouth daily.   Yes Historical Provider, MD  metFORMIN (GLUCOPHAGE) 500 MG tablet Take 500 mg by mouth 2 (two) times daily with a meal.   Yes Historical Provider, MD  metoprolol (LOPRESSOR) 100 MG tablet Take 100 mg by mouth 2 (two) times daily.   Yes Historical Provider, MD  pravastatin (PRAVACHOL) 20 MG tablet Take 20 mg by mouth daily.   Yes Historical Provider, MD  VITAMIN D, CHOLECALCIFEROL, PO Take by mouth.   Yes Historical Provider, MD   Physical Exam: Filed Vitals:   09/21/12 0322 09/21/12 0412 09/21/12 0441 09/21/12 0520  BP: 198/121 187/111 173/98 168/84  Pulse: 96 91 90 80  Temp:    97.8 F (36.6 C)  TempSrc:    Oral  Resp: 14 14 14 22   SpO2: 99% 98% 97% 92%    General:  NAD, resting comfortably in bed Eyes: PEERLA EOMI ENT: mucous membranes moist Neck: supple w/o JVD Cardiovascular: RRR w/o MRG Respiratory: CTA B Abdomen: soft, nt, nd, bs+ Skin: no rash nor lesion Musculoskeletal: MAE, full ROM all 4 extremities Psychiatric: normal tone and affect Neurologic: AAOx3, grossly non-focal  Labs on Admission:  Basic Metabolic Panel:  Recent Labs Lab 09/21/12  NA 134*  K 3.6  CL 97  CO2 23  GLUCOSE 366*  BUN 38*  CREATININE 3.84*  CALCIUM 9.3   Liver Function Tests:  Recent Labs Lab 09/21/12  AST 17  ALT 17  ALKPHOS 123*  BILITOT 0.3  PROT 7.5  ALBUMIN 3.4*   No results found for this basename: LIPASE, AMYLASE,  in the last 168 hours No results found for this basename: AMMONIA,  in the last 168 hours CBC:  Recent Labs Lab 09/21/12  WBC 16.1*  NEUTROABS 10.6*  HGB 13.9  HCT 39.0  MCV 78.9  PLT 393   Cardiac Enzymes: No results found for this basename: CKTOTAL, CKMB, CKMBINDEX, TROPONINI,  in the last 168 hours  BNP (last 3 results) No results found for this basename: PROBNP,   in the last 8760 hours CBG:  Recent Labs Lab 09/21/12 0329 09/21/12 0449  GLUCAP 329* 218*    Radiological Exams on Admission: Dg Chest 2 View  09/21/2012  *RADIOLOGY REPORT*  Clinical Data: Chest pain radiating down the left arm.  CHEST - 2 VIEW  Comparison: 12/20/2008.  Findings:  Cardiopericardial silhouette within normal limits. Mediastinal contours normal. Trachea midline.  No airspace disease or effusion.  IMPRESSION: No active cardiopulmonary disease.   Original Report Authenticated By: Andreas Newport, M.D.     EKG: Independently reviewed.  Assessment/Plan Principal Problem:   Hypertensive urgency, malignant Active Problems:   Essential hypertension, malignant   Acute kidney injury   DM2 (diabetes mellitus, type 2)   Chest pain   1. Hypertensive urgency, malignant - treating with nitro gtt, lowering SBP to around 180 for now since that's usually what it runs at home, have already overshot this goal with BP 168/78.  Continue home meds except lisinopril and lasix (see AKI below).  Does likely have OSA as well but states he has refused to get tested for this in the past. 2. AKI - secondary to RAS vs malignant nephrosclerosis vs diabetic / hypertensive nephropathy, ordering B renal ultrasound and B renal artery ultrasound to make sure there is no RAS, for now will hold lisinopril and lasix as nephrotoxic meds, likely needs nephrology consult from this regard, creatinine 3.8 with baseline 1.5.  This could alternatively represent a severe progression of CKD and new baseline, since his last baseline was back in 2010.  Progression of baseline from diabetic / hypertensive nephropathy would be consistent with CKD stage 4 and also need a nephrology consult. 3. DM2 - putting patient on lantus 10 units and med dose SSI (normally takes lantus 45 and metformin at home). 4. Chest pain - secondary to #1, checking troponin's and keeping on tele monitor but CP has completely resolved at this point  in time.  Certainly this patient is at risk for ACS in the future as well.    Code Status: Full Code (must indicate code status--if unknown or must be presumed, indicate so) Family Communication: Spoke with wife at bedside (indicate person spoken with, if applicable, with phone number if by telephone) Disposition Plan: Admit to inpatient stepdown (indicate anticipated LOS)  Time spent: 70 min  GARDNER, JARED M. [name] Pager [phone]  If 7PM-7AM, please contact night-coverage [email or web address] Password [facility] 09/21/2012, 5:37 AM

## 2012-09-21 NOTE — H&P (Signed)
History and Physical  DEAUNTE DENTE ZOX:[id number] DOB: February 16, 1971 DOA: 09/21/2012  Referring physician: ER PCP: Pola Corn, MD   Chief Complaint: Chest and left arm pain  HPI: Hayden [name] is a 42 y.o. male who presented yesterday with c/o chest pain, located in his center chest and epigastric area, 2/10 in severity, associated with left arm cramping. Nothing seems to make it worse, associated with very elevated blood pressure in ED 249/137 chest pain resolved after GI cocktail and L arm cramping resolved after his BP was controlled with nitro gtt. In the ED patient found to essentially be in hypertensive urgency, blood pressure was able to be controlled with nitro gtt. His chest and arm pain completely resolved although the nitro gtt has given him a headache. Patient was admitted by the hospitalist team but left AGAINST MEDICAL ADVICE.  Patient went home and basically came back with exactly similar complaints. Patient told the ER physician that he wanted to get admitted this time. His wife accompanied him who forced him to come back to the ER. Patient's blood pressure was elevated and his troponins have bumped to 0.77. He was started on a heparin drip and a nitro drip in the ER. Hospitalists team was called to admit the patient.  Review of Systems:  14 point review of system is negative except for as noted above  Past Medical History  Diagnosis Date  . History of kidney cancer   . Hypertension   . Diabetes mellitus   . CHF (congestive heart failure)   . Renal disorder     Past Surgical History  Procedure Laterality Date  . Nephrectomy    . Testicle torsion reduction    . Hernia repair      Social History:  reports that he has been smoking.  He does not have any smokeless tobacco history on file. He reports that  drinks alcohol. He reports that he uses illicit drugs (Marijuana).  Allergies  Allergen Reactions  . Dilaudid (Hydromorphone Hcl) Other (See Comments)     Abnormal behavior  . Morphine And Related Itching    No family history on file.   Prior to Admission medications   Medication Sig Start Date End Date Taking? Authorizing Provider  amLODipine (NORVASC) 10 MG tablet Take 10 mg by mouth daily.   Yes Historical Provider, MD  cloNIDine (CATAPRES) 0.2 MG tablet Take 0.2 mg by mouth 2 (two) times daily.   Yes Historical Provider, MD  escitalopram (LEXAPRO) 20 MG tablet Take 20 mg by mouth daily.   Yes Historical Provider, MD  furosemide (LASIX) 40 MG tablet Take 40 mg by mouth daily.   Yes Historical Provider, MD  insulin glargine (LANTUS) 100 UNIT/ML injection Inject 45 Units into the skin daily.    Yes Historical Provider, MD  losartan (COZAAR) 100 MG tablet Take 100 mg by mouth daily.   Yes Historical Provider, MD  metFORMIN (GLUCOPHAGE) 500 MG tablet Take 500 mg by mouth 2 (two) times daily with a meal.   Yes Historical Provider, MD  metoprolol (LOPRESSOR) 100 MG tablet Take 100 mg by mouth 2 (two) times daily.   Yes Historical Provider, MD  pravastatin (PRAVACHOL) 20 MG tablet Take 20 mg by mouth daily.   Yes Historical Provider, MD  VITAMIN D, CHOLECALCIFEROL, PO Take 1 capsule by mouth daily.    Yes Historical Provider, MD   Physical Exam: Filed Vitals:   09/21/12 2330 09/21/12 2345 09/22/12 0000 09/22/12 0015  BP: 145/74 155/76 162/77  147/80  Pulse: 82 82 82 80  Temp:      TempSrc:      Resp: 20 12 15 21   Height:      Weight:      SpO2: 96% 97% 97% 96%   General: NAD, resting comfortably in bed  Eyes: PEERLA EOMI  ENT: mucous membranes moist  Neck: supple w/o JVD  Cardiovascular: RRR w/o MRG  Respiratory: CTA B  Abdomen: soft, nt, nd, bs+  Skin: no rash nor lesion  Musculoskeletal: MAE, full ROM all 4 extremities  Psychiatric: normal tone and affect  Neurologic: AAOx3, grossly non-focal  Wt Readings from Last 3 Encounters:  09/21/12 298 lb (135.172 kg)  02/10/12 255 lb (115.667 kg)    Labs on Admission:  Basic  Metabolic Panel:  Recent Labs Lab 09/21/12 09/21/12 2120  NA 134* 139  K 3.6 3.4*  CL 97 102  CO2 23 25  GLUCOSE 366* 191*  BUN 38* 35*  CREATININE 3.84* 4.03*  CALCIUM 9.3 9.1    Liver Function Tests:  Recent Labs Lab 09/21/12  AST 17  ALT 17  ALKPHOS 123*  BILITOT 0.3  PROT 7.5  ALBUMIN 3.4*   CBC:  Recent Labs Lab 09/21/12 09/21/12 2120  WBC 16.1* 14.4*  NEUTROABS 10.6*  --   HGB 13.9 13.5  HCT 39.0 38.1*  MCV 78.9 81.1  PLT 393 390    Troponin (Point of Care Test)  Recent Labs  09/21/12 2148  TROPIPOC 0.77*    CBG:  Recent Labs Lab 09/21/12 0329 09/21/12 0449  GLUCAP 329* 218*     Radiological Exams on Admission: Dg Chest 2 View  09/21/2012  *RADIOLOGY REPORT*  Clinical Data: Chest pain radiating down the left arm.  CHEST - 2 VIEW  Comparison: 12/20/2008.  Findings:  Cardiopericardial silhouette within normal limits. Mediastinal contours normal. Trachea midline.  No airspace disease or effusion.  IMPRESSION: No active cardiopulmonary disease.   Original Report Authenticated By: Andreas Newport, M.D.     EKG: Independently reviewed. 91 beats per minute, left axis deviation, sinus rhythm, deep Q waves and mild ST elevation noted on V1 and V2 these are unchanged as compared to EKG done yesterday.   Principal Problem:   Acute coronary syndrome Active Problems:   Hypertensive urgency, malignant   Essential hypertension, malignant   Acute kidney injury   DM2 (diabetes mellitus, type 2)   Chest pain   Assessment/Plan  Patient is a 42 year old man with past medical history most significant for hypertension, chronic kidney disease, type 2 diabetes and history of renal cancer who comes in with chief complaints of chest pain and found to be on acute on chronic kidney failure. Patient's blood pressure was extremely high Which makes him hypertensive urgency. Patient is currently also having elevated troponin levels which probably are secondary to his  chronic kidney disease in the setting of hypertensive urgency.  # Acute coronary syndrome: Start the patient on heparin drip and nitro drip. Optimal medical therapy with aspirin, statin, beta blockers. If troponins raise further I will obtain a cardiology consult.  -Will check cardiac enzymes x3 -Lead EKG in the morning  # Hypertensive urgency, malignant - treating with nitro gtt, lowering SBP to around 180 for now since that's usually what it runs at home. Continue home meds except lisinopril and lasix (see AKI below). Does likely have OSA as well but states he has refused to get tested for this in the past.  # AKI - secondary to  RAS vs malignant nephrosclerosis vs diabetic / hypertensive nephropathy, ordering renal ultrasound to rule out obstruction. F`or now will hold lisinopril and lasix as nephrotoxic meds, likely needs nephrology consult from this regard, creatinine 3.8 with baseline 1.5. This could alternatively represent a severe progression of CKD and new baseline, since his last baseline was back in 2010. Progression of baseline from diabetic / hypertensive nephropathy would be consistent with CKD stage 4 and also need a nephrology consult.  #DM2 - putting patient on lantus 10 units and med dose SSI (normally takes lantus 45 and metformin at home). Check HBa1c   Code Status: Full code Family Communication: Updated at bedside Disposition Plan/Anticipated LOS: Guarded  Time spent: 75 minutes  Hayden Mage, MD  [name] Team 5  If 7PM-7AM, please contact night-coverage at [email or web address], password [facility] 09/22/2012, 12:28 AM

## 2012-09-21 NOTE — ED Notes (Signed)
CBG checked 329 

## 2012-09-21 NOTE — ED Notes (Signed)
Notified Triage RN Mariea Stable and Dr. Rubin Payor of elevated i stat troponin results.  Patient will be brought to Room A7

## 2012-09-21 NOTE — ED Provider Notes (Signed)
History     CSN: [id number]  Arrival date & time 09/21/12  2106   First MD Initiated Contact with Patient 09/21/12 2202      Chief Complaint  Patient presents with  . Hypertension    (Consider location/radiation/quality/duration/timing/severity/associated sxs/prior treatment) Patient is a 42 y.o. male presenting with hypertension. The history is provided by the patient.  Hypertension Associated symptoms include chest pain and headaches. Pertinent negatives include no abdominal pain and no shortness of breath.   patient was admitted to hospital yesterday for hypertensive emergency. He left AMA after a dispute with the staff. He now returns because his wife is making him. He states he has had a return of the chest pressure also. No trouble breathing. He describes it as a dull pressure. He also states he has a headache that he gets when his blood pressure goes up.  Past Medical History  Diagnosis Date  . History of kidney cancer   . Hypertension   . Diabetes mellitus   . CHF (congestive heart failure)   . Renal disorder     Past Surgical History  Procedure Laterality Date  . Nephrectomy    . Testicle torsion reduction    . Hernia repair      No family history on file.  History  Substance Use Topics  . Smoking status: Current Every Day Smoker  . Smokeless tobacco: Not on file  . Alcohol Use: Yes     Comment: "6 pack occ."      Review of Systems  Constitutional: Negative for activity change and appetite change.  HENT: Negative for neck stiffness.   Eyes: Negative for pain.  Respiratory: Positive for chest tightness. Negative for shortness of breath.   Cardiovascular: Positive for chest pain. Negative for leg swelling.  Gastrointestinal: Negative for nausea, vomiting, abdominal pain and diarrhea.  Genitourinary: Negative for flank pain.  Musculoskeletal: Negative for back pain.  Skin: Negative for rash.  Neurological: Positive for headaches. Negative for weakness  and numbness.  Psychiatric/Behavioral: Negative for behavioral problems.    Allergies  Dilaudid and Morphine and related  Home Medications   Current Outpatient Rx  Name  Route  Sig  Dispense  Refill  . amLODipine (NORVASC) 10 MG tablet   Oral   Take 10 mg by mouth daily.         . cloNIDine (CATAPRES) 0.2 MG tablet   Oral   Take 0.2 mg by mouth 2 (two) times daily.         Marland Kitchen escitalopram (LEXAPRO) 20 MG tablet   Oral   Take 20 mg by mouth daily.         . furosemide (LASIX) 40 MG tablet   Oral   Take 40 mg by mouth daily.         . insulin glargine (LANTUS) 100 UNIT/ML injection   Subcutaneous   Inject 45 Units into the skin daily.          Marland Kitchen losartan (COZAAR) 100 MG tablet   Oral   Take 100 mg by mouth daily.         . metFORMIN (GLUCOPHAGE) 500 MG tablet   Oral   Take 500 mg by mouth 2 (two) times daily with a meal.         . metoprolol (LOPRESSOR) 100 MG tablet   Oral   Take 100 mg by mouth 2 (two) times daily.         . pravastatin (PRAVACHOL) 20 MG tablet  Oral   Take 20 mg by mouth daily.         Marland Kitchen VITAMIN D, CHOLECALCIFEROL, PO   Oral   Take 1 capsule by mouth daily.            BP 145/74  Pulse 82  Temp(Src) 97.6 F (36.4 C) (Oral)  Resp 20  Ht 5\' 11"  (1.803 m)  Wt 298 lb (135.172 kg)  BMI 41.58 kg/m2  SpO2 96%  Physical Exam  Nursing note and vitals reviewed. Constitutional: He is oriented to person, place, and time. He appears well-developed and well-nourished.  Patient is obese.  HENT:  Head: Normocephalic and atraumatic.  Eyes: EOM are normal. Pupils are equal, round, and reactive to light.  Neck: Normal range of motion. Neck supple.  Cardiovascular: Normal rate, regular rhythm and normal heart sounds.   No murmur heard. Pulmonary/Chest: Effort normal and breath sounds normal.  Abdominal: Soft. Bowel sounds are normal. He exhibits no distension and no mass. There is no tenderness. There is no rebound and no  guarding.  Musculoskeletal: Normal range of motion. He exhibits no edema.  Neurological: He is alert and oriented to person, place, and time. No cranial nerve deficit.  Skin: Skin is warm and dry.  Psychiatric: He has a normal mood and affect.    ED Course  Procedures (including critical care time)  Labs Reviewed  CBC - Abnormal; Notable for the following:    WBC 14.4 (*)    HCT 38.1 (*)    All other components within normal limits  BASIC METABOLIC PANEL - Abnormal; Notable for the following:    Potassium 3.4 (*)    Glucose, Bld 191 (*)    BUN 35 (*)    Creatinine, Ser 4.03 (*)    GFR calc non Af Amer 17 (*)    GFR calc Af Amer 20 (*)    All other components within normal limits  POCT I-STAT TROPONIN I - Abnormal; Notable for the following:    Troponin i, poc 0.77 (*)    All other components within normal limits  PRO B NATRIURETIC PEPTIDE  HEPARIN LEVEL (UNFRACTIONATED)  CBC   Dg Chest 2 View  09/21/2012  *RADIOLOGY REPORT*  Clinical Data: Chest pain radiating down the left arm.  CHEST - 2 VIEW  Comparison: 12/20/2008.  Findings:  Cardiopericardial silhouette within normal limits. Mediastinal contours normal. Trachea midline.  No airspace disease or effusion.  IMPRESSION: No active cardiopulmonary disease.   Original Report Authenticated By: Andreas Newport, M.D.      1. Hypertensive urgency, malignant   2. Acute kidney injury     Date: 09/21/2012  Rate: 91  Rhythm: normal sinus rhythm  QRS Axis: normal  Intervals: normal  ST/T Wave abnormalities: nonspecific ST/T changes  Conduction Disutrbances:LVH  Narrative Interpretation: Nonspecific ST and T wave changes.  Old EKG Reviewed: changes noted  CRITICAL CARE Performed by: Billee Cashing.   Total critical care time: 30  Critical care time was exclusive of separately billable procedures and treating other patients.  Critical care was necessary to treat or prevent imminent or life-threatening  deterioration.  Critical care was time spent personally by me on the following activities: development of treatment plan with patient and/or surrogate as well as nursing, discussions with consultants, evaluation of patient's response to treatment, examination of patient, obtaining history from patient or surrogate, ordering and performing treatments and interventions, ordering and review of laboratory studies, ordering and review of radiographic studies, pulse oximetry and re-evaluation  of patient's condition.   MDM  Patient presents with chest pain and hypertensive her emergency. Recently seen for same, but now with elevated troponin. Patient's been started on nitroglycerin drip and given IV hydralazine. His blood pressures improved. He was seen for the same yesterday and left AMA. Patient was started back on the drips will be admitted to step down unit after discussion with critical care. He'll be admitted to internal medicine.        Juliet Rude. Rubin Payor, MD 09/21/12 2351

## 2012-09-22 ENCOUNTER — Other Ambulatory Visit (HOSPITAL_COMMUNITY): Payer: 59

## 2012-09-22 ENCOUNTER — Encounter (HOSPITAL_COMMUNITY): Payer: Self-pay | Admitting: *Deleted

## 2012-09-22 ENCOUNTER — Inpatient Hospital Stay (HOSPITAL_COMMUNITY): Payer: 59

## 2012-09-22 DIAGNOSIS — I517 Cardiomegaly: Secondary | ICD-10-CM | POA: Diagnosis present

## 2012-09-22 DIAGNOSIS — I249 Acute ischemic heart disease, unspecified: Secondary | ICD-10-CM | POA: Diagnosis present

## 2012-09-22 DIAGNOSIS — Z905 Acquired absence of kidney: Secondary | ICD-10-CM

## 2012-09-22 DIAGNOSIS — I10 Essential (primary) hypertension: Secondary | ICD-10-CM

## 2012-09-22 DIAGNOSIS — I200 Unstable angina: Secondary | ICD-10-CM

## 2012-09-22 DIAGNOSIS — I5022 Chronic systolic (congestive) heart failure: Secondary | ICD-10-CM | POA: Diagnosis present

## 2012-09-22 LAB — BASIC METABOLIC PANEL
Chloride: 102 mEq/L (ref 96–112)
GFR calc Af Amer: 19 mL/min — ABNORMAL LOW (ref >90)
GFR calc non Af Amer: 17 mL/min — ABNORMAL LOW (ref >90)
Potassium: 3.5 mEq/L (ref 3.5–5.1)
Sodium: 138 mEq/L (ref 135–145)

## 2012-09-22 LAB — GLUCOSE, CAPILLARY
Glucose-Capillary: 214 mg/dL — ABNORMAL HIGH (ref 70–99)
Glucose-Capillary: 215 mg/dL — ABNORMAL HIGH (ref 70–99)
Glucose-Capillary: 168 mg/dL — ABNORMAL HIGH (ref 70–99)

## 2012-09-22 LAB — URINE MICROSCOPIC-ADD ON

## 2012-09-22 LAB — URINALYSIS, ROUTINE W REFLEX MICROSCOPIC
Leukocytes, UA: NEGATIVE
Nitrite: NEGATIVE
Protein, ur: >300 mg/dL — AB
Urobilinogen, UA: 0.2 mg/dL (ref 0.0–1.0)

## 2012-09-22 LAB — CBC
Hemoglobin: 11.3 g/dL — ABNORMAL LOW (ref 13.0–17.0)
MCHC: 34.2 g/dL (ref 30.0–36.0)
RDW: 13.8 % (ref 11.5–15.5)
WBC: 10.6 10*3/uL — ABNORMAL HIGH (ref 4.0–10.5)

## 2012-09-22 LAB — HEMOGLOBIN A1C
Hgb A1c MFr Bld: 9.9 % — ABNORMAL HIGH (ref <5.7)
Mean Plasma Glucose: 237 mg/dL — ABNORMAL HIGH (ref <117)

## 2012-09-22 LAB — TROPONIN I: Troponin I: 1.33 ng/mL (ref <0.30)

## 2012-09-22 LAB — PRO B NATRIURETIC PEPTIDE: Pro B Natriuretic peptide (BNP): 7377 pg/mL — ABNORMAL HIGH (ref 0–125)

## 2012-09-22 LAB — RAPID URINE DRUG SCREEN, HOSP PERFORMED: Opiates: POSITIVE — AB

## 2012-09-22 LAB — HEPARIN LEVEL (UNFRACTIONATED): Heparin Unfractionated: 0.28 IU/mL — ABNORMAL LOW (ref 0.30–0.70)

## 2012-09-22 MED ORDER — HYDRALAZINE HCL 50 MG PO TABS
100.0000 mg | ORAL_TABLET | Freq: Three times a day (TID) | ORAL | Status: DC
  Administered 2012-09-22 – 2012-09-23 (×2): 100 mg via ORAL
  Filled 2012-09-22 (×5): qty 2

## 2012-09-22 MED ORDER — SODIUM CHLORIDE 0.9 % IJ SOLN: 3.0000 mL | INTRAMUSCULAR | Status: DC | PRN

## 2012-09-22 MED ORDER — HYDRALAZINE HCL 50 MG PO TABS
50.0000 mg | ORAL_TABLET | Freq: Three times a day (TID) | ORAL | Status: DC
Start: 2012-09-22 — End: 2012-09-22
  Administered 2012-09-22: 50 mg via ORAL
  Filled 2012-09-22 (×3): qty 1

## 2012-09-22 MED ORDER — ASPIRIN EC 81 MG PO TBEC
81.0000 mg | DELAYED_RELEASE_TABLET | Freq: Every day | ORAL | Status: DC
  Administered 2012-09-22 – 2012-09-26 (×5): 81 mg via ORAL
  Filled 2012-09-22 (×5): qty 1

## 2012-09-22 MED ORDER — ONDANSETRON HCL 4 MG/2ML IJ SOLN: 4.0000 mg | Freq: Four times a day (QID) | INTRAMUSCULAR | Status: DC | PRN

## 2012-09-22 MED ORDER — INSULIN GLARGINE 100 UNIT/ML ~~LOC~~ SOLN
10.0000 [IU] | Freq: Every day | SUBCUTANEOUS | Status: DC
  Administered 2012-09-22: 10 [IU] via SUBCUTANEOUS
  Filled 2012-09-22: qty 0.1

## 2012-09-22 MED ORDER — HEPARIN (PORCINE) IN NACL 100-0.45 UNIT/ML-% IJ SOLN
2050.0000 [IU]/h | INTRAMUSCULAR | Status: DC
  Filled 2012-09-22 (×2): qty 250

## 2012-09-22 MED ORDER — INSULIN GLARGINE 100 UNIT/ML ~~LOC~~ SOLN: 20.0000 [IU] | Freq: Every day | SUBCUTANEOUS | Status: DC

## 2012-09-22 MED ORDER — ALPRAZOLAM 0.5 MG PO TABS
0.5000 mg | ORAL_TABLET | Freq: Once | ORAL | Status: DC
  Filled 2012-09-22: qty 1

## 2012-09-22 MED ORDER — AMLODIPINE BESYLATE 10 MG PO TABS
10.0000 mg | ORAL_TABLET | Freq: Every day | ORAL | Status: DC
  Administered 2012-09-22 – 2012-09-26 (×5): 10 mg via ORAL
  Filled 2012-09-22 (×5): qty 1

## 2012-09-22 MED ORDER — BD GETTING STARTED TAKE HOME KIT: 3/10ML X 30G SYRINGES
1.0000 | Freq: Once | Status: DC
  Filled 2012-09-22: qty 1

## 2012-09-22 MED ORDER — FUROSEMIDE 40 MG PO TABS
40.0000 mg | ORAL_TABLET | Freq: Two times a day (BID) | ORAL | Status: DC
  Administered 2012-09-22 – 2012-09-24 (×4): 40 mg via ORAL
  Filled 2012-09-22 (×6): qty 1

## 2012-09-22 MED ORDER — ONDANSETRON HCL 4 MG PO TABS: 4.0000 mg | ORAL_TABLET | Freq: Four times a day (QID) | ORAL | Status: DC | PRN

## 2012-09-22 MED ORDER — INSULIN ASPART 100 UNIT/ML ~~LOC~~ SOLN
0.0000 [IU] | Freq: Three times a day (TID) | SUBCUTANEOUS | Status: DC
  Administered 2012-09-22 (×2): 3 [IU] via SUBCUTANEOUS

## 2012-09-22 MED ORDER — CLONIDINE HCL 0.2 MG PO TABS
0.2000 mg | ORAL_TABLET | Freq: Two times a day (BID) | ORAL | Status: DC
  Administered 2012-09-22: 0.2 mg via ORAL
  Filled 2012-09-22 (×2): qty 1

## 2012-09-22 MED ORDER — SODIUM CHLORIDE 0.9 % IV SOLN: 250.0000 mL | INTRAVENOUS | Status: DC | PRN

## 2012-09-22 MED ORDER — LABETALOL HCL 200 MG PO TABS
200.0000 mg | ORAL_TABLET | Freq: Two times a day (BID) | ORAL | Status: DC
  Administered 2012-09-22 – 2012-09-24 (×6): 200 mg via ORAL
  Filled 2012-09-22 (×8): qty 1

## 2012-09-22 MED ORDER — NITROGLYCERIN IN D5W 200-5 MCG/ML-% IV SOLN
2.0000 ug/min | INTRAVENOUS | Status: DC
  Administered 2012-09-22: 20 ug/min via INTRAVENOUS

## 2012-09-22 MED ORDER — SODIUM CHLORIDE 0.9 % IJ SOLN
3.0000 mL | Freq: Two times a day (BID) | INTRAMUSCULAR | Status: DC
  Administered 2012-09-22 – 2012-09-23 (×2): 3 mL via INTRAVENOUS

## 2012-09-22 MED ORDER — ESCITALOPRAM OXALATE 20 MG PO TABS
20.0000 mg | ORAL_TABLET | Freq: Every day | ORAL | Status: DC
  Administered 2012-09-22 – 2012-09-26 (×5): 20 mg via ORAL
  Filled 2012-09-22 (×5): qty 1

## 2012-09-22 MED ORDER — INSULIN ASPART 100 UNIT/ML ~~LOC~~ SOLN
0.0000 [IU] | Freq: Every day | SUBCUTANEOUS | Status: DC
  Administered 2012-09-22: 2 [IU] via SUBCUTANEOUS

## 2012-09-22 MED ORDER — SODIUM CHLORIDE 0.9 % IJ SOLN: 3.0000 mL | Freq: Two times a day (BID) | INTRAMUSCULAR | Status: DC

## 2012-09-22 MED ORDER — INSULIN ASPART 100 UNIT/ML ~~LOC~~ SOLN
0.0000 [IU] | Freq: Three times a day (TID) | SUBCUTANEOUS | Status: DC
  Administered 2012-09-22: 3 [IU] via SUBCUTANEOUS
  Administered 2012-09-23: 8 [IU] via SUBCUTANEOUS
  Administered 2012-09-23 – 2012-09-24 (×3): 5 [IU] via SUBCUTANEOUS
  Administered 2012-09-24: 1 [IU] via SUBCUTANEOUS
  Administered 2012-09-24: 3 [IU] via SUBCUTANEOUS
  Administered 2012-09-25: 5 [IU] via SUBCUTANEOUS
  Administered 2012-09-25 – 2012-09-26 (×3): 3 [IU] via SUBCUTANEOUS

## 2012-09-22 MED ORDER — HEPARIN BOLUS VIA INFUSION
3000.0000 [IU] | Freq: Once | INTRAVENOUS | Status: AC
  Administered 2012-09-22: 3000 [IU] via INTRAVENOUS
  Filled 2012-09-22: qty 3000

## 2012-09-22 MED ORDER — METOPROLOL TARTRATE 100 MG PO TABS
100.0000 mg | ORAL_TABLET | Freq: Two times a day (BID) | ORAL | Status: DC
  Administered 2012-09-22 (×2): 100 mg via ORAL
  Filled 2012-09-22 (×3): qty 1

## 2012-09-22 MED ORDER — INSULIN GLARGINE 100 UNIT/ML ~~LOC~~ SOLN
30.0000 [IU] | Freq: Every day | SUBCUTANEOUS | Status: DC
  Administered 2012-09-23: 30 [IU] via SUBCUTANEOUS
  Filled 2012-09-22: qty 0.3

## 2012-09-22 MED ORDER — CLONIDINE HCL 0.3 MG PO TABS
0.3000 mg | ORAL_TABLET | Freq: Two times a day (BID) | ORAL | Status: DC
  Administered 2012-09-22 – 2012-09-23 (×2): 0.3 mg via ORAL
  Filled 2012-09-22 (×3): qty 1

## 2012-09-22 MED ORDER — BD GETTING STARTED TAKE HOME KIT: 3/10ML X 30G SYRINGES
1.0000 | Freq: Once | Status: AC
  Administered 2012-09-22: 1
  Filled 2012-09-22: qty 1

## 2012-09-22 MED ORDER — HYDROCODONE-ACETAMINOPHEN 5-325 MG PO TABS
1.0000 | ORAL_TABLET | ORAL | Status: DC | PRN
  Administered 2012-09-22 – 2012-09-23 (×6): 2 via ORAL
  Filled 2012-09-22 (×4): qty 2
  Filled 2012-09-22: qty 1
  Filled 2012-09-22 (×2): qty 2

## 2012-09-22 MED ORDER — HEPARIN SODIUM (PORCINE) 5000 UNIT/ML IJ SOLN
5000.0000 [IU] | Freq: Three times a day (TID) | INTRAMUSCULAR | Status: DC
  Administered 2012-09-22 – 2012-09-26 (×11): 5000 [IU] via SUBCUTANEOUS
  Filled 2012-09-22 (×14): qty 1

## 2012-09-22 MED ORDER — SIMVASTATIN 10 MG PO TABS
10.0000 mg | ORAL_TABLET | Freq: Every day | ORAL | Status: DC
  Administered 2012-09-22 – 2012-09-25 (×4): 10 mg via ORAL
  Filled 2012-09-22 (×5): qty 1

## 2012-09-22 NOTE — Progress Notes (Signed)
[name] Progress Note [place] TEAM 1 - Stepdown/ICU TEAM   Keshon Markovitz Michael YNW:[id number] DOB: 05-05-1971 DOA: 09/21/2012 PCP: Pola Corn, MD  Brief narrative: Hayden [name] is a 42 y.o. male who presented to the ER on 3/24 with c/o chest pain, located in his center chest and epigastric area, 2/10 in severity, associated with left arm cramping  associated with very elevated blood pressure- (249/137) chest pain resolved after GI cocktail and L arm cramping resolved after his BP was controlled with nitro gtt. Patient was admitted by the hospitalist team but left AGAINST MEDICAL ADVICE.  Patient went home and basically came back with exactly similar complaints. Patient told the ER physician that he wanted to get admitted this time. His wife accompanied him who forced him to come back to the ER. Patient's blood pressure was elevated and his troponins have bumped to 0.77. He was started on a heparin drip and a nitro drip in the ER. Hospitalists team was called to admit the patient.   Assessment/Plan: Principal Problem:   Hypertensive urgency, malignant -He has difficult to control blood pressure and is on multiple medications but still has not been able to be weaned off of the nitroglycerin infusion -Will increase his hydralazine and his clonidine in attempts to wean off of nitroglycerin infusion later today  Active Problems:   Acute kidney injury -Baseline creatinine uncertain -Continue to follow - appreciate nephrology consult    DM2 (diabetes mellitus, type 2) -Patient takes 45 units of Lantus at home -As he will likely be on a more stricter diet here than he is at home, I have decreased his dosage to 30 for now and we'll continue to monitor and adjust as    Chest pain/Acute coronary syndrome -Likely secondary to malignant hypertension and no further workup recommended at this time by cardiology -Will DC heparin infusion as troponins are now trending down    History  of partial Right nephrectomy for renal mass (2008)    Left ventricular hypertrophy (moderate)/ Chronic systolic congestive heart failure/EF 40% PER echo 2010 -Patient advised of correlation between uncontrolled blood pressure and heart failure    Code Status: Full  Family Communication: Wife  Disposition Plan: Continue to follow in step down unit while weaning off of nitroglycerin Consultants:  cardiology  Procedures: None  Antibiotics: None  DVT prophylaxis: Currently on heparin infusion We'll be transitioning to subcutaneous heparin  HPI/Subjective: Patient is alert and no longer has any chest pain or arm pain. He does complain of a headache. He is been strongly advised about the necessity of managing his blood pressure and diabetes and the need to lose weight to help further control these issues. He has been educated in regards to his chronic heart failure and chronic kidney disease due to uncontrolled blood pressure and blood sugars.   Objective: Blood pressure 178/112, pulse 73, temperature 97.3 F (36.3 C), temperature source Oral, resp. rate 18, height 5\' 11"  (1.803 m), weight 134.9 kg (297 lb 6.4 oz), SpO2 92.00%.  Intake/Output Summary (Last 24 hours) at 09/22/12 1856 Last data filed at 09/22/12 1700  Gross per 24 hour  Intake 881.06 ml  Output      0 ml  Net 881.06 ml     Exam: General: No acute respiratory distress Lungs: Clear to auscultation bilaterally without wheezes or crackles Cardiovascular: Regular rate and rhythm without murmur gallop or rub normal S1 and S2 Abdomen: Nontender, nondistended, soft, bowel sounds positive, no rebound, no ascites, no appreciable  mass Extremities: No significant cyanosis, clubbing, or edema bilateral lower extremities  Data Reviewed: Basic Metabolic Panel:  Recent Labs Lab 09/21/12 09/21/12 2120 09/22/12 0430  NA 134* 139 138  K 3.6 3.4* 3.5  CL 97 102 102  CO2 23 25 23   GLUCOSE 366* 191* 203*  BUN 38* 35* 37*   CREATININE 3.84* 4.03* 4.16*  CALCIUM 9.3 9.1 8.9   Liver Function Tests:  Recent Labs Lab 09/21/12  AST 17  ALT 17  ALKPHOS 123*  BILITOT 0.3  PROT 7.5  ALBUMIN 3.4*   No results found for this basename: LIPASE, AMYLASE,  in the last 168 hours No results found for this basename: AMMONIA,  in the last 168 hours CBC:  Recent Labs Lab 09/21/12 09/21/12 2120 09/22/12 0430  WBC 16.1* 14.4* 10.6*  NEUTROABS 10.6*  --   --   HGB 13.9 13.5 11.3*  HCT 39.0 38.1* 33.0*  MCV 78.9 81.1 81.3  PLT 393 390 343   Cardiac Enzymes:  Recent Labs Lab 09/22/12 0430 09/22/12 1001 09/22/12 1634  TROPONINI 1.33* 1.49* 0.85*   BNP (last 3 results)  Recent Labs  09/21/12 2120  PROBNP 7377.0*   CBG:  Recent Labs Lab 09/21/12 0329 09/21/12 0449 09/22/12 0706 09/22/12 1222  GLUCAP 329* 218* 214* 215*    Recent Results (from the past 240 hour(s))  MRSA PCR SCREENING     Status: None   Collection Time    09/22/12  3:21 AM      Result Value Range Status   MRSA by PCR NEGATIVE  NEGATIVE Final   Comment:            The GeneXpert MRSA Assay (FDA     approved for NASAL specimens     only), is one component of a     comprehensive MRSA colonization     surveillance program. It is not     intended to diagnose MRSA     infection nor to guide or     monitor treatment for     MRSA infections.     Studies:  Recent x-ray studies have been reviewed in detail by the Attending Physician  Scheduled Meds:  Scheduled Meds: . ALPRAZolam  0.5 mg Oral Once  . amLODipine  10 mg Oral Daily  . aspirin EC  81 mg Oral Daily  . cloNIDine  0.3 mg Oral BID  . escitalopram  20 mg Oral Daily  . furosemide  40 mg Oral BID  . hydrALAZINE  100 mg Oral Q8H  . insulin aspart  0-15 Units Subcutaneous TID WC  . insulin aspart  0-5 Units Subcutaneous QHS  . [START ON 09/23/2012] insulin glargine  20 Units Subcutaneous Daily  . labetalol  200 mg Oral BID  . simvastatin  10 mg Oral q1800  .  sodium chloride  3 mL Intravenous Q12H  . sodium chloride  3 mL Intravenous Q12H   Continuous Infusions: . heparin 2,050 Units/hr (09/22/12 1600)  . nitroGLYCERIN 20 mcg/min (09/22/12 0800)    Time spent on care of this patient: 35 minutes   [facility]  [name] Office  [phone] Pager - Text Page per Loretha Stapler as per below:  On-Call/Text Page:      [email or web address]      password [email or web address]  If 7PM-7AM, please contact night-coverage [email or web address] Password [facility] 09/22/2012, 6:56 PM   LOS: 1 day

## 2012-09-22 NOTE — Progress Notes (Signed)
Inpatient Diabetes Program Recommendations  AACE/ADA: New Consensus Statement on Inpatient Glycemic Control (2013)  Target Ranges:  Prepandial:   less than 140 mg/dL      Peak postprandial:   less than 180 mg/dL (1-2 hours)      Critically ill patients:  140 - 180 mg/dL   Diabetes Coordinator consult "diabetes with poor control, will require insulin".  Patient takes Lantus insulin at home.  Spoke with RN and she states that patient is knowledgeable about self administering insulin.  RN will re-consult if patient has questions/concerns.   Lantus dose has been increased to 20 units to start tomorrow.  Home dose stated in med reconciliation is 45 units. Will follow. Thank you  Piedad Climes BSN, RN,CDE Inpatient Diabetes Coordinator [phone] (team pager)

## 2012-09-22 NOTE — Progress Notes (Signed)
Pt troponin came back at 1.33 this morning NP Schorr paged no new orders at this time. Will continue to monitor.

## 2012-09-22 NOTE — Consult Note (Signed)
CARDIOLOGY CONSULT NOTE   Patient ID: Hayden [name] MRN: [id number] DOB/AGE: [birth year] 40 y.o.  Admit date: 09/21/2012  Primary Physician   Pola Corn, MD Primary Cardiologist   New Reason for Consultation   Chest pain, elevated enzymes  Hayden [name] J Ansley is a 42 y.o. male smoker with no history of CAD but with a history of acute systolic and diastolic CHF in 4401, HTN, DM, renal cancer, and chronic renal disease. He presented to the ED 09/20/12 pm with 10/10 burning chest pain associated with L arm cramping, lightheadedness, and BP of 249/137.  Malignant HTN was managed with nitro gtt and his BP was 168/78 with chest pain down to 3/10. Troponin was slightly elevated at 0.07 and there were nonspecific EKG abnormalities. The patient was admitted and left AMA shortly after admission because of a dispute with the staff. He returned to the ED last night 09/21/12 with HTN, chest pain, and HA. Troponin was increased at 0.77 and he was started back on nitro drip and IV hydralazine. Admitted to internal medicine. Cardiology was asked to consult.  Currently he reports a mild headache and a "terrible mood". He denies chest pain, L arm pain, palpitations, lightheadedness, dizziness, N/V, diaphoresis, or SOB.  He occasionally has exertional dyspnea and lower extremity edema. He has a sedentary lifestyle with 20 pack year history and no intention of stopping his tobacco use. 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   Past Medical History  Diagnosis Date  . History of kidney cancer   . Hypertension   . Diabetes mellitus   . CHF (congestive heart failure) 2010    Acute systolic and diastolic CHF  . Renal disorder   . Obesity     Past Surgical History  Procedure Laterality Date  . Nephrectomy    . Testicle torsion reduction    . Hernia repair     Allergies  Allergen Reactions  . Dilaudid (Hydromorphone Hcl) Other  (See Comments)    Abnormal behavior  . Morphine And Related Itching    I have reviewed the patient's current medications . ALPRAZolam  0.5 mg Oral Once  . amLODipine  10 mg Oral Daily  . aspirin EC  81 mg Oral Daily  . cloNIDine  0.2 mg Oral BID  . escitalopram  20 mg Oral Daily  . insulin aspart  0-9 Units Subcutaneous TID WC  . insulin glargine  10 Units Subcutaneous Daily  . metoprolol  100 mg Oral BID  . simvastatin  10 mg Oral q1800  . sodium chloride  3 mL Intravenous Q12H  . sodium chloride  3 mL Intravenous Q12H   . heparin 1,850 Units/hr (09/22/12 0800)  . nitroGLYCERIN 20 mcg/min (09/22/12 0800)   sodium chloride, HYDROcodone-acetaminophen, ondansetron (ZOFRAN) IV, ondansetron, sodium chloride  Prior to Admission medications   Medication Sig Start Date End Date Taking? Authorizing Provider  amLODipine (NORVASC) 10 MG tablet Take 10 mg by mouth daily.   Yes Historical Provider, MD  cloNIDine (CATAPRES) 0.2 MG tablet Take 0.2 mg by mouth 2 (two) times daily.   Yes Historical Provider, MD  escitalopram (LEXAPRO) 20 MG tablet Take 20 mg by mouth daily.   Yes Historical Provider, MD  furosemide (LASIX) 40 MG tablet Take 40 mg by mouth daily.   Yes Historical Provider, MD  insulin glargine (LANTUS) 100 UNIT/ML injection Inject 45 Units into the skin daily.  Yes Historical Provider, MD  losartan (COZAAR) 100 MG tablet Take 100 mg by mouth daily.   Yes Historical Provider, MD  metFORMIN (GLUCOPHAGE) 500 MG tablet Take 500 mg by mouth 2 (two) times daily with a meal.   Yes Historical Provider, MD  metoprolol (LOPRESSOR) 100 MG tablet Take 100 mg by mouth 2 (two) times daily.   Yes Historical Provider, MD  pravastatin (PRAVACHOL) 20 MG tablet Take 20 mg by mouth daily.   Yes Historical Provider, MD  VITAMIN D, CHOLECALCIFEROL, PO Take 1 capsule by mouth daily.    Yes Historical Provider, MD     History   Social History  . Marital Status: Married    Spouse Name: N/A     Number of Children: N/A  . Years of Education: N/A   Occupational History  . Hayden [name]    Social History Main Topics  . Smoking status: Current Every Day Smoker  . Smokeless tobacco: Not on file  . Alcohol Use: Yes     Comment: "6 pack occ."  . Drug Use: Yes    Special: Marijuana  . Sexually Active: Not on file   Other Topics Concern  . Not on file   Social History Narrative   Positive for multiple uncles with uncontrolled hypertension on his mother's side.  One uncle on his mother's side at age 38 had a myocardial infarction and one at age 62 had a myocardial  infarction.    ROS: No bleeding, no visual changes. Full 14 point review of systems complete and found to be negative unless listed above.  Physical Exam: Blood pressure 178/112, pulse 73, temperature 97.8 F (36.6 C), temperature source Oral, resp. rate 18, height 5\' 11"  (1.803 m), weight 297 lb 6.4 oz (134.9 kg), SpO2 92.00%.  General: Well developed, well nourished, obese male in no acute distress Head: Eyes PERRLA, No xanthomas. Normocephalic and atraumatic, oropharynx without edema or exudate.  Lungs: CTA bilaterally. Heart: HRRR S1 S2, no rub/gallop, no murmur. pulses are 2+ all 4 extrem.   Neck: No carotid bruits. No lymphadenopathy. JVD not elevated. Abdomen: Bowel sounds present, abdomen soft and non-tender without masses or hernias noted. Msk:  No spine or cva tenderness. No weakness, no joint deformities or effusions. Extremities: No clubbing or cyanosis. 1+ bilateral LE edema up to the proximal tibia.  Neuro: Alert and oriented X 3. No focal deficits noted. Psych:  Flat affect, answers questions appropriately. Patient states that he is in a "terrible" mood.  Skin: No rashes or lesions noted.  Labs:   Lab Results  Component Value Date   WBC 10.6* 09/22/2012   HGB 11.3* 09/22/2012   HCT 33.0* 09/22/2012   MCV 81.3 09/22/2012   PLT 343 09/22/2012    Recent Labs Lab 09/21/12  09/22/12 0430  NA 134*  <  > 138  K 3.6  < > 3.5  CL 97  < > 102  CO2 23  < > 23  BUN 38*  < > 37*  CREATININE 3.84*  < > 4.16*  CALCIUM 9.3  < > 8.9  PROT 7.5  --   --   BILITOT 0.3  --   --   ALKPHOS 123*  --   --   ALT 17  --   --   AST 17  --   --   GLUCOSE 366*  < > 203*  < > = values in this interval not displayed. No results found for this basename: mg  Recent Labs  09/22/12 0430  TROPONINI 1.33*    Recent Labs  09/21/12 0004 09/21/12 2148  TROPIPOC 0.07 0.77*   Pro B Natriuretic peptide (BNP)  Date/Time Value Range Status  09/21/2012  9:20 PM 7377.0* 0 - 125 pg/mL Final  12/20/2008  4:32 AM 327.0* 0.0 - 100.0 pg/mL Final   TSH  Date/Time Value Range Status  12/20/2008  6:19 AM 2.983 Test methodology is 3rd generation TSH* 0.350 - 4.500 uIU/mL Final   Echo: 12/20/2008 Conclusions 1. Left ventricle: Left ventrical poorly visualized. Mild dilatation ? EF around 40-45% with ? of inferolateral hypokinesis Evony Rezek thickness was increased in a pattern of moderate LVH. 2. Atrial septum: No defect or patent foramen ovale was identified.  ECG:  22-Sep-2012 06:59:29 [facility] ROUTINE RECORD Normal sinus rhythm Left axis deviation Left ventricular hypertrophy with repolarization abnormality Cannot rule out Septal infarct , age undetermined Vent. rate 67 BPM PR interval 174 ms QRS duration 110 ms QT/QTc 466/492 ms P-R-T axes 37 -43 155  Radiology:   Dg Chest 2 View 09/21/2012  *RADIOLOGY REPORT*  Clinical Data: Chest pain radiating down the left arm.  CHEST - 2 VIEW  Comparison: 12/20/2008.  Findings:  Cardiopericardial silhouette within normal limits. Mediastinal contours normal. Trachea midline.  No airspace disease or effusion.  IMPRESSION: No active cardiopulmonary disease.   Original Report Authenticated By: Andreas Newport, M.D.     ASSESSMENT AND PLAN:   The patient was seen today by Dr Daleen Squibb, the patient evaluated and the data reviewed.  Principal Problem:   Acute  coronary syndrome - He is currently without chest pain. He is on heparin/IV NTG. He is on an ASA, BB and statin. His renal function precludes ACE/ARB. His EF was deceased in 12-07-08 with possible WMA, recheck ordered but will cancel as it will not change treatment. Family is concerned about hemochromatosis (father) so will check ferritin. Further evaluation and treatment per MD. His cardiac enzyme increase (possibly elevated BNP as well) is likely due to a combination of malignant HTN, renal insufficiency and heart strain from those issues.  Otherwise, per primary MD. Active Problems:   Hypertensive urgency, malignant - His BP was decreased about 50 mmHg yesterday, but is still not controlled. Ok to bring down BP further, will add PO hydralazine and increase clonidine.    Essential hypertension, malignant   Acute kidney injury   DM2 (diabetes mellitus, type 2)   Chest pain   History of partial Right nephrectomy for renal mass 12/08/06)   Left ventricular hypertrophy (moderate) per ECHO 12/07/2008   Chronic systolic congestive heart failure/EF 40% PER echo 12-07-2008   Elevation of cardiac enzymes  Signed: Tandy Gaw 09/22/2012 10:37 AM  Theodore Demark, PA-C 09/22/2012, 10:37 AM Co-Sign MD I have taken a history, reviewed medications, allergies, PMH, SH, FH, and reviewed ROS and examined the patient.  I agree with the assessment and plan. Had a Hayden discussion with patient, sister, as well as his wife. A major problem is noncompliance and refusal to take better care of himself. I strongly recommended that he take all of his medications, quit smoking, and keep doctor appointments. His elevated enzymes are secondary to myocardial strain from his hypertensive crisis, acute on chronic combined congestive heart failure, and acute on chronic kidney disease or injury.  I would not obtain an echocardiogram because it will not change our treatment plan. Until he shows some accountability for his health   I would not offer any  further invasive studies. He is getting close to dialysis.I made this clear to the patient and his family. Also stated that he probably has coronary disease and less he takes his meds to stop smoking he is at high risk of having an acute coronary syndrome as well as the possibility of a stroke.  His wife expressed frustration outside the room with a struggle for her and his family to get him to take care of himself. After Hayden discussion, he did have a clear understanding of his clinical situation seemed to be more willing to engage in doing what we asked him to do medically.    Iverna Hammac C. Daleen Squibb, MD, [facility] Pager:  [phone]

## 2012-09-22 NOTE — Consult Note (Signed)
[facility] - CONSULT NOTE Resident Note    Please see below for attending addendum to resident note.   Date: 09/22/2012                  Patient Name:  Hayden [name]  MRN: [id number]  DOB: 1971/01/06  Age / Sex: 42 y.o., male         PCP: Pola Corn                 Referring Physician: Lars Mage, MD                 Reason for Consult: Worsening Cr            History of Present Illness: Patient is a 42 y.o. obese Caucasian male with a PMHx significant for renal cancer and R partial renal nephrectomy in 2008, HTN, DM, and a history of acute systolic and diastolic CHF (in 1478).  He was admitted to [facility] on 09/21/2012 for evaluation of chest pain. The patient presented to the ED on 09/20/2012 with 10/10 burning chest pain and associated L arm cramping. The patient also experiences lightheadedness and had a BP of 249/137. Malignant HTN emergency was managed with nitro gtt and his BP was down to 168/78. At that time, the patient had resolution of chest pain and L arm cramping. His troponin was slightly elevated (0.07) and there were nonspecific EKG abnormalities. The patient was admitted but left AMA shortly after his admission. He returned the following evening (09/21/2012) with similar symptoms of HTN, chest pain and headache/lightheadedness. At this visit, his troponin increased to 0.77 and he was subsequently started on nitro drop and IV hydralazine.   The patient now reports just a mild headache after nitro gtt. He denies chest pain, L arm pain, palpitations, lightheadedness, dizziness, N/V, diaphoresis, or SOB. He also denies flank pain, or any urinary symptoms. He occastionally has exertional dyspnea and lower extremity edema. The patient does not have an active lifestyle and has a 20 pack year smoking history and reports occasional marijuana use.   He reports being followed by Dr. Shana Chute but only sees him every 6 mos mostly due to the patients preference- he had a  creatinine of 1.5 back in 2010 but denies having any lab test done in between.  Upon admit to hosp, crt was 3.8, is now 4.1- no uop has been recorded but pt states that he has had.  He was on losartan PTA but that has been held.  He does not have what I would call uremic symptoms.  Medications: Outpatient medications: Prescriptions prior to admission  Medication Sig Dispense Refill  . amLODipine (NORVASC) 10 MG tablet Take 10 mg by mouth daily.      . cloNIDine (CATAPRES) 0.2 MG tablet Take 0.2 mg by mouth 2 (two) times daily.      Marland Kitchen escitalopram (LEXAPRO) 20 MG tablet Take 20 mg by mouth daily.      . furosemide (LASIX) 40 MG tablet Take 40 mg by mouth daily.      . insulin glargine (LANTUS) 100 UNIT/ML injection Inject 45 Units into the skin daily.       Marland Kitchen losartan (COZAAR) 100 MG tablet Take 100 mg by mouth daily.      . metFORMIN (GLUCOPHAGE) 500 MG tablet Take 500 mg by mouth 2 (two) times daily with a meal.      . metoprolol (LOPRESSOR) 100 MG tablet Take 100 mg by mouth 2 (  two) times daily.      . pravastatin (PRAVACHOL) 20 MG tablet Take 20 mg by mouth daily.      Marland Kitchen VITAMIN D, CHOLECALCIFEROL, PO Take 1 capsule by mouth daily.         Current medications: Current Facility-Administered Medications  Medication Dose Route Frequency Provider Last Rate Last Dose  . 0.9 %  sodium chloride infusion  250 mL Intravenous PRN Lars Mage, MD      . ALPRAZolam Prudy Feeler) tablet 0.5 mg  0.5 mg Oral Once Leanne Chang, NP      . amLODipine (NORVASC) tablet 10 mg  10 mg Oral Daily Lars Mage, MD   10 mg at 09/22/12 1013  . aspirin EC tablet 81 mg  81 mg Oral Daily Lars Mage, MD   81 mg at 09/22/12 0422  . cloNIDine (CATAPRES) tablet 0.3 mg  0.3 mg Oral BID Gaylord Shih, MD      . escitalopram (LEXAPRO) tablet 20 mg  20 mg Oral Daily Lars Mage, MD   20 mg at 09/22/12 1013  . heparin ADULT infusion 100 units/mL (25000 units/250 mL)  1,850 Units/hr Intravenous Continuous Lars Mage, MD 18.5  mL/hr at 09/22/12 1038 1,850 Units/hr at 09/22/12 1038  . hydrALAZINE (APRESOLINE) tablet 50 mg  50 mg Oral Q8H Gaylord Shih, MD      . HYDROcodone-acetaminophen (NORCO/VICODIN) 5-325 MG per tablet 1-2 tablet  1-2 tablet Oral Q4H PRN Lars Mage, MD   2 tablet at 09/22/12 0753  . insulin aspart (novoLOG) injection 0-9 Units  0-9 Units Subcutaneous TID WC Lars Mage, MD   3 Units at 09/22/12 0754  . insulin glargine (LANTUS) injection 10 Units  10 Units Subcutaneous Daily Lars Mage, MD   10 Units at 09/22/12 1013  . metoprolol (LOPRESSOR) tablet 100 mg  100 mg Oral BID Lars Mage, MD   100 mg at 09/22/12 1013  . nitroGLYCERIN 0.2 mg/mL in dextrose 5 % infusion  2-200 mcg/min Intravenous Titrated Lars Mage, MD 6 mL/hr at 09/22/12 0800 20 mcg/min at 09/22/12 0800  . ondansetron (ZOFRAN) tablet 4 mg  4 mg Oral Q6H PRN Lars Mage, MD       Or  . ondansetron (ZOFRAN) injection 4 mg  4 mg Intravenous Q6H PRN Lars Mage, MD      . simvastatin (ZOCOR) tablet 10 mg  10 mg Oral q1800 Lars Mage, MD      . sodium chloride 0.9 % injection 3 mL  3 mL Intravenous Q12H Ankit Garg, MD      . sodium chloride 0.9 % injection 3 mL  3 mL Intravenous Q12H Lars Mage, MD   3 mL at 09/22/12 0314  . sodium chloride 0.9 % injection 3 mL  3 mL Intravenous PRN Lars Mage, MD          Allergies: Allergies  Allergen Reactions  . Dilaudid (Hydromorphone Hcl) Other (See Comments)    Abnormal behavior  . Morphine And Related Itching      Past Medical History: Past Medical History  Diagnosis Date  . History of kidney cancer   . Hypertension   . Diabetes mellitus   . CHF (congestive heart failure) 2010    Acute systolic and diastolic CHF  . Renal disorder   . Obesity      Past Surgical History: Past Surgical History  Procedure Laterality Date  . Nephrectomy    . Testicle torsion reduction    . Hernia repair  Family History: History reviewed. No pertinent family history.   Social History:   reports that he has been smoking.  He does not have any smokeless tobacco history on file. He reports that  drinks alcohol. He reports that he uses illicit drugs (Marijuana).   Review of Systems: As per HPI  Vital Signs: Blood pressure 178/112, pulse 73, temperature 97.8 F (36.6 C), temperature source Oral, resp. rate 18, height 5\' 11"  (1.803 m), weight 134.9 kg (297 lb 6.4 oz), SpO2 92.00%.  Weight trends: Filed Weights   09/21/12 2237 09/22/12 0245  Weight: 135.172 kg (298 lb) 134.9 kg (297 lb 6.4 oz)    Physical Exam: General: Vital signs reviewed and noted. Well-developed, well-nourished, in no acute distress; alert, appropriate and cooperative throughout examination.  Head: Normocephalic, atraumatic.  Eyes: PERRL, EOMI, No signs of anemia or jaundince.  Nose: Mucous membranes moist, not inflammed, nonerythematous.  Throat: Oropharynx nonerythematous, no exudate appreciated.   Neck: No deformities, masses, or tenderness noted.Supple, No carotid Bruits, no JVD.  Lungs:  Normal respiratory effort. Clear to auscultation BL without crackles or wheezes.  Heart: RRR. S1 and S2 normal without gallop, murmur, or rubs.  Abdomen:  BS normoactive. Soft, Nondistended, non-tender.  No masses or organomegaly.  Extremities: 1+ pretibial edema.  Neurologic: A&O X3, CN II - XII are grossly intact. Motor strength is 5/5 in the all 4 extremities, Sensations intact to light touch, Cerebellar signs negative.  Skin: No visible rashes, scars.    Lab results: Basic Metabolic Panel:  Recent Labs Lab 09/21/12 09/21/12 2120 09/22/12 0430  NA 134* 139 138  K 3.6 3.4* 3.5  CL 97 102 102  CO2 23 25 23   GLUCOSE 366* 191* 203*  BUN 38* 35* 37*  CREATININE 3.84* 4.03* 4.16*  CALCIUM 9.3 9.1 8.9    Liver Function Tests:  Recent Labs Lab 09/21/12  AST 17  ALT 17  ALKPHOS 123*  BILITOT 0.3  PROT 7.5  ALBUMIN 3.4*   No results found for this basename: LIPASE, AMYLASE,  in the last 168  hours No results found for this basename: AMMONIA,  in the last 168 hours  CBC:  Recent Labs Lab 09/21/12 09/21/12 2120 09/22/12 0430  WBC 16.1* 14.4* 10.6*  NEUTROABS 10.6*  --   --   HGB 13.9 13.5 11.3*  HCT 39.0 38.1* 33.0*  MCV 78.9 81.1 81.3  PLT 393 390 343    Cardiac Enzymes:  Recent Labs Lab 09/22/12 0430  TROPONINI 1.33*    BNP: No components found with this basename: POCBNP,   CBG:  Recent Labs Lab 09/21/12 0329 09/21/12 0449 09/22/12 0706  GLUCAP 329* 218* 214*    Microbiology: Results for orders placed during the hospital encounter of 09/21/12  MRSA PCR SCREENING     Status: None   Collection Time    09/22/12  3:21 AM      Result Value Range Status   MRSA by PCR NEGATIVE  NEGATIVE Final   Comment:            The GeneXpert MRSA Assay (FDA     approved for NASAL specimens     only), is one component of a     comprehensive MRSA colonization     surveillance program. It is not     intended to diagnose MRSA     infection nor to guide or     monitor treatment for     MRSA infections.    Coagulation Studies: No results  found for this basename: LABPROT, INR,  in the last 72 hours  Urinalysis: No results found for this basename: COLORURINE, APPERANCEUR, LABSPEC, PHURINE, GLUCOSEU, HGBUR, BILIRUBINUR, KETONESUR, PROTEINUR, UROBILINOGEN, NITRITE, LEUKOCYTESUR,  in the last 72 hours    Imaging: Dg Chest 2 View  09/21/2012  *RADIOLOGY REPORT*  Clinical Data: Chest pain radiating down the left arm.  CHEST - 2 VIEW  Comparison: 12/20/2008.  Findings:  Cardiopericardial silhouette within normal limits. Mediastinal contours normal. Trachea midline.  No airspace disease or effusion.  IMPRESSION: No active cardiopulmonary disease.   Original Report Authenticated By: Andreas Newport, M.D.       Assessment & Plan: Pt is a 42 y.o. yo male with a PMHX of renal cancer, HTN, DM, and a history of acute systolic and diastolic CHF (in 1610), was admitted to [facility]  on 09/21/2012 with chest pain, L arm cramping, and headache found to be in hypertensive emergency with a BP of [id number] and started on nitro gtt. [place] came [place] to [address]. Renal service was consulted because Pt has a baseline Cr of 1.5 which was in 2010 and now has a Cr of 4.16.  1. AKI vs. CKD - We do not know what true recent renal function baseline is. 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 Diabetic/hypertensive nephropathy.I agree with  Renal US ordered to r/o obstruction, will check U/A as well.   Lisinopril on hold appropriately. Will continue to follow UOP and renal fxn. I am hopeful that some of the increased creatinine is due to hypertensive urgency.  Also, it is not unusual in the setting of hypertensive urgency that correction of BP can give some rise to creatinine.  We will need to see where this settles out.  Pt is aware that he has advanced renal dysfunction, he is just hoping that if he does the right thing from here on out, he may get some function back.   2. HTN/Vol - Idea is to get BP lower but not too low.  Will restart lasix 40mg  po BID as pt is still very much hypertensive and has some evidence of fluid over load in the lower extremities. Continue other meds as is, likely would not restart ACE for quite some time, if ever  3. Anemia- will check iron stores and treat as needed, no ESA needed right now  4. Bones- will check phos and PTH and treat as needed   DVT PPX - UF Heparin   Patient history and plan of care reviewed with attending, Dr. Kathrene Bongo.   Winnifred Friar, Student-PA  09/22/2012, 11:20 AM  Patient seen and examined, agree with above note with above modifications. 42 year old WM with several years of DM and HTN not under good control.  We do not know recent renal function baseline.  Suspect most of this is CKD due to above.  Agree with trying to get BP lower but not too low and we will follow UOP and kidney function.  I will screen for anemia and bone dz  related to CKD and treat as needed.  He will need follow up with me as OP Annie Sable, MD 09/22/2012

## 2012-09-22 NOTE — Consult Note (Signed)
ANTICOAGULATION CONSULT NOTE Pharmacy Consult for Heparin Indication: chest pain/ACS  Allergies  Allergen Reactions  . Dilaudid (Hydromorphone Hcl) Other (See Comments)    Abnormal behavior  . Morphine And Related Itching    Patient Measurements: Height: 5\' 11"  (180.3 cm) Weight: 297 lb 6.4 oz (134.9 kg) IBW/kg (Calculated) : 75.3 Heparin Dosing Weight: ~106kg  Vital Signs: Temp: 98.3 F (36.8 C) (03/25 0422) Temp src: Oral (03/25 0422) BP: 165/104 mmHg (03/25 0530) Pulse Rate: 72 (03/25 0530)  Labs:  Recent Labs  09/21/12 09/21/12 2120 09/22/12 0430  HGB 13.9 13.5 11.3*  HCT 39.0 38.1* 33.0*  PLT 393 390 343  HEPARINUNFRC  --   --  <0.10*  CREATININE 3.84* 4.03* 4.16*  TROPONINI  --   --  1.33*    Estimated Creatinine Clearance: 33.1 ml/min (by C-G formula based on Cr of 4.16).  Assessment: 42 yo male with chest pain for heparin.  Goal of Therapy:  Heparin level 0.3-0.7 units/ml Monitor platelets by anticoagulation protocol: Yes   Plan:  Heparin 3000 units IV bolus, then increase heparin 1850 units/hr Check heparin level in 6 hours.   Eddie Candle 09/22/2012,6:07 AM

## 2012-09-22 NOTE — Consult Note (Signed)
ANTICOAGULATION CONSULT NOTE Pharmacy Consult for Heparin Indication: chest pain/ACS  Allergies  Allergen Reactions  . Dilaudid (Hydromorphone Hcl) Other (See Comments)    Abnormal behavior  . Morphine And Related Itching    Patient Measurements: Height: 5\' 11"  (180.3 cm) Weight: 297 lb 6.4 oz (134.9 kg) IBW/kg (Calculated) : 75.3 Heparin Dosing Weight: ~106kg  Vital Signs: Temp: 97.3 F (36.3 C) (03/25 1528) Temp src: Oral (03/25 1528) BP: 178/112 mmHg (03/25 0756) Pulse Rate: 73 (03/25 0756)  Labs:  Recent Labs  09/21/12 09/21/12 2120 09/22/12 0430 09/22/12 1001 09/22/12 1316  HGB 13.9 13.5 11.3*  --   --   HCT 39.0 38.1* 33.0*  --   --   PLT 393 390 343  --   --   HEPARINUNFRC  --   --  <0.10*  --  0.28*  CREATININE 3.84* 4.03* 4.16*  --   --   TROPONINI  --   --  1.33* 1.49*  --     Estimated Creatinine Clearance: 33.1 ml/min (by C-G formula based on Cr of 4.16).  Assessment: 42 yo male with chest pain for heparin. Initial HL below goal at 0.28. No bleeding issues noted. CBC slightly down this morning.  Goal of Therapy:  Heparin level 0.3-0.7 units/ml Monitor platelets by anticoagulation protocol: Yes   Plan:  Increase heparin 2050 units/hr Check heparin level in 8 hours.   Sheppard Coil PharmD., BCPS Clinical Pharmacist Pager [phone] 09/22/2012 3:45 PM

## 2012-09-23 DIAGNOSIS — N179 Acute kidney failure, unspecified: Secondary | ICD-10-CM

## 2012-09-23 DIAGNOSIS — E119 Type 2 diabetes mellitus without complications: Secondary | ICD-10-CM

## 2012-09-23 LAB — IRON AND TIBC: Saturation Ratios: 12 % — ABNORMAL LOW (ref 20–55)

## 2012-09-23 LAB — RENAL FUNCTION PANEL
GFR calc Af Amer: 16 mL/min — ABNORMAL LOW (ref >90)
Glucose, Bld: 221 mg/dL — ABNORMAL HIGH (ref 70–99)
Phosphorus: 7.1 mg/dL — ABNORMAL HIGH (ref 2.3–4.6)
Potassium: 4 mEq/L (ref 3.5–5.1)
Sodium: 133 mEq/L — ABNORMAL LOW (ref 135–145)

## 2012-09-23 LAB — GLUCOSE, CAPILLARY
Glucose-Capillary: 231 mg/dL — ABNORMAL HIGH (ref 70–99)
Glucose-Capillary: 193 mg/dL — ABNORMAL HIGH (ref 70–99)

## 2012-09-23 MED ORDER — NA FERRIC GLUC CPLX IN SUCROSE 12.5 MG/ML IV SOLN
125.0000 mg | Freq: Every day | INTRAVENOUS | Status: AC
  Administered 2012-09-23 – 2012-09-24 (×2): 125 mg via INTRAVENOUS
  Filled 2012-09-23 (×3): qty 10

## 2012-09-23 MED ORDER — ALPRAZOLAM 0.5 MG PO TABS
0.5000 mg | ORAL_TABLET | Freq: Three times a day (TID) | ORAL | Status: DC | PRN
  Administered 2012-09-23 – 2012-09-25 (×4): 0.5 mg via ORAL
  Filled 2012-09-23 (×3): qty 1

## 2012-09-23 MED ORDER — ISOSORBIDE DINITRATE 10 MG PO TABS
10.0000 mg | ORAL_TABLET | Freq: Two times a day (BID) | ORAL | Status: DC
  Administered 2012-09-23 – 2012-09-26 (×7): 10 mg via ORAL
  Filled 2012-09-23 (×9): qty 1

## 2012-09-23 MED ORDER — INSULIN GLARGINE 100 UNIT/ML ~~LOC~~ SOLN
45.0000 [IU] | Freq: Every day | SUBCUTANEOUS | Status: DC
  Administered 2012-09-24 – 2012-09-26 (×3): 45 [IU] via SUBCUTANEOUS
  Filled 2012-09-23 (×3): qty 0.45

## 2012-09-23 MED ORDER — OXYCODONE HCL 5 MG PO TABS: 5.0000 mg | ORAL_TABLET | ORAL | Status: DC | PRN

## 2012-09-23 MED ORDER — ISOSORBIDE MONONITRATE ER 60 MG PO TB24
60.0000 mg | ORAL_TABLET | Freq: Every day | ORAL | Status: DC
  Administered 2012-09-23: 60 mg via ORAL
  Filled 2012-09-23: qty 1

## 2012-09-23 MED ORDER — HYDRALAZINE HCL 50 MG PO TABS
100.0000 mg | ORAL_TABLET | Freq: Two times a day (BID) | ORAL | Status: DC
  Administered 2012-09-23 – 2012-09-26 (×6): 100 mg via ORAL
  Filled 2012-09-23 (×7): qty 2

## 2012-09-23 MED ORDER — ACETAMINOPHEN 325 MG PO TABS: 650.0000 mg | ORAL_TABLET | Freq: Four times a day (QID) | ORAL | Status: DC | PRN

## 2012-09-23 MED ORDER — CALCIUM CARBONATE ANTACID 500 MG PO CHEW
400.0000 mg | CHEWABLE_TABLET | Freq: Three times a day (TID) | ORAL | Status: DC
  Administered 2012-09-23 – 2012-09-25 (×8): 400 mg via ORAL
  Filled 2012-09-23 (×14): qty 2

## 2012-09-23 NOTE — Progress Notes (Signed)
SUBJECTIVE: He feels better today. No chest pain. BP is improved but still not controlled. STill on NTG drip at 5 mcg.    Filed Vitals:   09/23/12 0300 09/23/12 0400 09/23/12 0555 09/23/12 0600  BP: 145/78 154/87 171/85 164/71  Pulse: 74 70  78  Temp:      TempSrc:      Resp: 15 18  26   Height:      Weight:      SpO2: 94% 96%  93%    Intake/Output Summary (Last 24 hours) at 09/23/12 0744 Last data filed at 09/23/12 0600  Gross per 24 hour  Intake    367 ml  Output   1275 ml  Net   -908 ml    LABS: Basic Metabolic Panel:  Recent Labs  21/30/86 0430 09/23/12 0415  NA 138 133*  K 3.5 4.0  CL 102 98  CO2 23 26  GLUCOSE 203* 221*  BUN 37* 44*  CREATININE 4.16* 4.85*  CALCIUM 8.9 8.8  PHOS  --  7.1*   Liver Function Tests:  Recent Labs  09/21/12 09/23/12 0415  AST 17  --   ALT 17  --   ALKPHOS 123*  --   BILITOT 0.3  --   PROT 7.5  --   ALBUMIN 3.4* 2.8*   No results found for this basename: LIPASE, AMYLASE,  in the last 72 hours CBC:  Recent Labs  09/21/12 09/21/12 2120 09/22/12 0430  WBC 16.1* 14.4* 10.6*  NEUTROABS 10.6*  --   --   HGB 13.9 13.5 11.3*  HCT 39.0 38.1* 33.0*  MCV 78.9 81.1 81.3  PLT 393 390 343   Cardiac Enzymes:  Recent Labs  09/22/12 0430 09/22/12 1001 09/22/12 1634  TROPONINI 1.33* 1.49* 0.85*   BNP: No components found with this basename: POCBNP,  D-Dimer: No results found for this basename: DDIMER,  in the last 72 hours Hemoglobin A1C:  Recent Labs  09/22/12 0430  HGBA1C 9.9*   Fasting Lipid Panel: No results found for this basename: CHOL, HDL, LDLCALC, TRIG, CHOLHDL, LDLDIRECT,  in the last 72 hours Thyroid Function Tests:  Recent Labs  09/22/12 0430  TSH 3.203   Anemia Panel: No results found for this basename: VITAMINB12, FOLATE, FERRITIN, TIBC, IRON, RETICCTPCT,  in the last 72 hours   PHYSICAL EXAM General: Well developed, well nourished, in no acute distress HEENT:  Normocephalic and  atramatic Neck:  No JVD.  Lungs: Clear bilaterally to auscultation and percussion. Heart: HRRR . Normal S1 and S2 without gallops or murmurs.  Abdomen: Bowel sounds are positive, abdomen soft and non-tender  Msk:  Back normal, normal gait. Normal strength and tone for age. Extremities: No clubbing, cyanosis or edema.   Neuro: Alert and oriented X 3. Psych:  Good affect, responds appropriately  TELEMETRY: Reviewed telemetry pt in NSR.  ASSESSMENT AND PLAN:    1)  Hypertensive urgency, malignant: BP is still not optimal. Refractory hypertension is likely due to advanced CKD and possible underlying sleep apnea.  Recommend increasing the dose of Labetalol gradually. Continue other medications. Consider an outpatient sleep study.  2) Type 2 NSTEMI due to supply demand ischemia: continue medical therapy. Agree with Dr. Vern Claude recommendations.     3) Chronic systolic congestive heart failure/EF 40% PER echo 2010: NO ACE I or ARB due to advanced CKD. Consider adding Imdur to Hydralazine as an alternative.      Lorine Bears, MD, [facility] 09/23/2012 7:44 AM

## 2012-09-23 NOTE — Plan of Care (Signed)
Problem: Food- and Nutrition-Related Knowledge Deficit (NB-1.1) Goal: Nutrition education Formal process to instruct or train a patient/client in a skill or to impart knowledge to help patients/clients voluntarily manage or modify food choices and eating behavior to maintain or improve health. Outcome: Completed/Met Date Met:  09/23/12  RD consulted for nutrition education regarding diabetes and heart failure.  Patient also with renal failure.  Receiving phosphorous binder as well.    Lab Results  Component Value Date    HGBA1C 9.9* 09/22/2012   Phosphorus  Date Value Range Status  09/23/2012 7.1* 2.3 - 4.6 mg/dL Final    RD provided "Carbohydrate Counting for People with Diabetes" handout from the Academy of Nutrition and Dietetics as well as a handout for low sodium. Discussed different food groups and their effects on blood sugar, emphasizing carbohydrate-containing foods. Provided list of carbohydrates and recommended serving sizes of common foods. Reviewed foods that are high in sodium that should be limited or avoided.  Also discussed foods that are high in phosphorous to limit.  Teach back method used.  [place], [place]  Many of the foods that need to be limited are a part of his everyday diet.  Dietary changes may be difficult for patient, but he is willing to try.  Discussed options for meals and snacks. Expect fair compliance.  Body mass index is 41.5 kg/(m^2). Pt meets criteria for morbid obesity based on current BMI.  Current diet order is CHO-modified medium, patient is consuming approximately 100% of meals at this time. Labs and medications reviewed. No further nutrition interventions warranted at this time. RD contact information provided. If additional nutrition issues arise, please re-consult RD.  Joaquin Courts, RD, LDN, CNSC Pager# [phone] After Hours Pager# [phone]

## 2012-09-23 NOTE — Progress Notes (Signed)
Received report from nurse on 3300, patient has arrived to unit and is stable; will continue to monitor patient. Lorretta Harp RN

## 2012-09-23 NOTE — Progress Notes (Signed)
[facility] - PROGRESS NOTE Resident Note   Please see below for attending addendum to resident note.  Subjective:   Mr. Mcglocklin feels much better today. No chest pain, still mild headache. Pt has been walking the halls periodically. BP has improved but still is not controlled. He is still on the NTG drip at . PT is diuresing well.   Objective:    Vital Signs:   Temp:  [97.3 F (36.3 C)-97.8 F (36.6 C)] 97.8 F (36.6 C) (03/26 0700) Pulse Rate:  [65-91] 78 (03/26 0600) Resp:  [15-26] 26 (03/26 0600) BP: (120-171)/(54-102) 164/71 mmHg (03/26 0600) SpO2:  [91 %-97 %] 93 % (03/26 0600) Last BM Date: 09/21/12  24-hour weight change: Weight change:   Weight trends: Filed Weights   09/21/12 2237 09/22/12 0245  Weight: 135.172 kg (298 lb) 134.9 kg (297 lb 6.4 oz)    Intake/Output:  03/25 0701 - 03/26 0700 In: 367 [I.V.:367] Out: 1275 [Urine:1275]  Physical Exam: General: Vital signs reviewed and noted. Well-developed, well-nourished, in no acute distress; alert, appropriate and cooperative throughout examination.  Lungs:  Normal respiratory effort. Clear to auscultation BL without crackles or wheezes.  Heart: RRR. S1 and S2 normal without gallop, murmur, or rubs.  Abdomen:  BS normoactive. Soft, Nondistended, non-tender.  No masses or organomegaly.  Extremities: No pretibial edema.     Labs: Basic Metabolic Panel:  Recent Labs Lab 09/21/12 2120 09/22/12 0430 09/23/12 0415  NA 139 138 133*  K 3.4* 3.5 4.0  CL 102 102 98  CO2 25 23 26   GLUCOSE 191* 203* 221*  BUN 35* 37* 44*  CREATININE 4.03* 4.16* 4.85*  CALCIUM 9.1 8.9 8.8  PHOS  --   --  7.1*    Liver Function Tests:  Recent Labs Lab 09/21/12 09/23/12 0415  AST 17  --   ALT 17  --   ALKPHOS 123*  --   BILITOT 0.3  --   PROT 7.5  --   ALBUMIN 3.4* 2.8*   No results found for this basename: LIPASE, AMYLASE,  in the last 168 hours No results found for this basename: AMMONIA,  in  the last 168 hours  CBC:  Recent Labs Lab 09/21/12 09/21/12 2120 09/22/12 0430  WBC 16.1* 14.4* 10.6*  NEUTROABS 10.6*  --   --   HGB 13.9 13.5 11.3*  HCT 39.0 38.1* 33.0*  MCV 78.9 81.1 81.3  PLT 393 390 343    Cardiac Enzymes:  Recent Labs Lab 09/22/12 0430 09/22/12 1001 09/22/12 1634  TROPONINI 1.33* 1.49* 0.85*    BNP: No components found with this basename: POCBNP,   CBG:  Recent Labs Lab 09/22/12 0706 09/22/12 1222 09/22/12 1731 09/22/12 2239 09/23/12 0724  GLUCAP 214* 215* 168* 247* 231*    Microbiology: Results for orders placed during the hospital encounter of 09/21/12  MRSA PCR SCREENING     Status: None   Collection Time    09/22/12  3:21 AM      Result Value Range Status   MRSA by PCR NEGATIVE  NEGATIVE Final   Comment:            The GeneXpert MRSA Assay (FDA     approved for NASAL specimens     only), is one component of a     comprehensive MRSA colonization     surveillance program. It is not     intended to diagnose MRSA     infection nor to guide or  monitor treatment for     MRSA infections.    Coagulation Studies: No results found for this basename: LABPROT, INR,  in the last 72 hours  Urinalysis:    Component Value Date/Time   COLORURINE YELLOW 09/22/2012 1805   APPEARANCEUR HAZY* 09/22/2012 1805   LABSPEC 1.022 09/22/2012 1805   PHURINE 5.0 09/22/2012 1805   GLUCOSEU 250* 09/22/2012 1805   HGBUR SMALL* 09/22/2012 1805   BILIRUBINUR NEGATIVE 09/22/2012 1805   KETONESUR NEGATIVE 09/22/2012 1805   PROTEINUR >300* 09/22/2012 1805   UROBILINOGEN 0.2 09/22/2012 1805   NITRITE NEGATIVE 09/22/2012 1805   LEUKOCYTESUR NEGATIVE 09/22/2012 1805     Imaging: US Renal  09/22/2012  *RADIOLOGY REPORT*  Clinical Data: Acute renal injury.  RENAL/URINARY TRACT ULTRASOUND COMPLETE  Comparison:  MRI 12/22/2006.  Findings:  Right Kidney:  10.4 cm in length.  Mild renal cortical thinning but normal echogenicity.  A 1.4 x 1.2 x 1.6 cm lower pole  cyst is noted.  No hydronephrosis.  Left Kidney:  11.5 cm in length.  Diffuse increased echogenicity but normal renal cortical thickness.  No hydronephrosis, renal calculus or mass.  Bladder:  Normal.  Additional findings:  Fatty infiltration of the liver is noted.  IMPRESSION:  1.  Mild increased echogenicity of the left kidney.  Mild right renal cortical thinning. 2.  No hydronephrosis.   Original Report Authenticated By: Rudie Meyer, M.D.       Medications:    Infusions: . nitroGLYCERIN 5 mcg/min (09/23/12 0600)    Scheduled Medications: . ALPRAZolam  0.5 mg Oral Once  . amLODipine  10 mg Oral Daily  . aspirin EC  81 mg Oral Daily  . cloNIDine  0.3 mg Oral BID  . escitalopram  20 mg Oral Daily  . furosemide  40 mg Oral BID  . heparin subcutaneous  5,000 Units Subcutaneous Q8H  . hydrALAZINE  100 mg Oral Q8H  . insulin aspart  0-15 Units Subcutaneous TID WC  . insulin aspart  0-5 Units Subcutaneous QHS  . insulin glargine  30 Units Subcutaneous Daily  . labetalol  200 mg Oral BID  . simvastatin  10 mg Oral q1800  . sodium chloride  3 mL Intravenous Q12H    PRN Medications: sodium chloride, HYDROcodone-acetaminophen, ondansetron (ZOFRAN) IV, ondansetron, sodium chloride   Assessment/ Plan:    Pt is a 42 y.o. yo male with a PMHx of renal cancer, poorly controlled HTN, DM, and history of acute systolic and diastolic CHF in 0981. 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 BP came down to 168/78. Renal service is consulted for Cr of 4.16 (from 1.5 in 2010).   1) AKI vs CKD: True recent baseline Cr is unknown to [place].  Renal US showed mild increased echogenicity of L kidney, mild R renal cortical thinning and no evidence of hydronephrosis. Cr today is 4.85 (up from 4.16 yesterday) and BUN is 44 (up from 37 yesterday). UA was unrevealing- just showed protein  which is not surprising and expected with diabetic nephropathy. UOP and renal fxn will continue to be followed.   2) HTN/vol - I actually think that BP is close to ideal at this point, we want it 140-170 so that it is not taken down too much acutely, we can continue to fine tune as OP.  I also want to make regimen as simple  as possible to improve compliance- consider changing hydralazine to BID.  I agree with getting IV ntg off however  3) Anemia - tsat 12, ferr 258, will replete iron since hgb now 11.3, ESA not needed  4) Bones - Phos level 7.1, pt will need phosphate binder, TUMS 1000 with meals. Awaiting PTH level   Length of Stay: 2 days  Patient history and plan of care reviewed with attending, Dr. Annie Sable.   Winnifred Friar, Student-PA  09/23/2012, 8:54 AM  Patient seen and examined, agree with above note with above modifications. Not surprising that creatinine bumped up given hemodynamic changes.  I agree with stopping ntg drip and moving him to floor room, keeping his BP higher than we normally would and await stability of renal function.  Given his high phos will start tums, iron stores slightly low, will replete. Obviously cannot be discharged until creatinine levels out   Annie Sable, MD 09/23/2012

## 2012-09-23 NOTE — Progress Notes (Signed)
Report called pt transferring to [id number] via w/c with belongings and wife. Hayden [name]

## 2012-09-23 NOTE — Progress Notes (Signed)
[name] Progress Note [place] TEAM 1 - Stepdown/ICU TEAM   Hayden [name] LKG:[id number] DOB: [birth year] DOA: 09/21/2012 PCP: Pola Corn, MD  Brief narrative: 42 y.o. male who presented to the ER on 3/24 with c/o chest pain in his center chest and epigastric area, 2/10 in severity, associated with left arm cramping  associated with very elevated blood pressure (249/137).  Chest pain resolved after GI cocktail and L arm cramping resolved after his BP was controlled with nitro gtt. Patient was admitted by the hospitalist team but left AGAINST MEDICAL ADVICE.  Patient went home and came back with exactly similar complaints. Patient told the ER physician that he wanted to get admitted this time. His wife accompanied him who forced him to come back to the ER. Patient's blood pressure was elevated and his troponins bumped to 0.77. He was started on a heparin drip and a nitro drip in the ER. Hospitalists team was called to admit the patient.   Assessment/Plan:  Hypertensive urgency, malignant -appears that difficulty w/ BP control was simply due to noncompliance -clonidine is a very poor choice in a pt w/ intermittent compliance due to a propensity for rebound HTN when doses are missed - will therefore d/c clonidine  -add imdur to hydralazine in setting of inability to use ACE/ARB (due to kidney disease) in this diabetic who likely also has heart disease  Acute kidney injury -Baseline creatinine uncertain -Nephrology following - crt continues to climb  Uncontrolled DM2  -Patient prescribed 45 units of Lantus at home -CBG remains poorly controlled - adjust tx plan and follow   Chest pain/Acute coronary syndrome -Likely secondary to malignant hypertension -no further workup recommended at this time by Cardiology  History of partial Right nephrectomy for renal mass (2008)  Left ventricular hypertrophy (moderate)/ Chronic systolic congestive heart failure/EF 40% PER echo  2010 -Patient advised of correlation between uncontrolled blood pressure and heart failure   Code Status: Full  Family Communication: spoke w/ pt and wife at bedside  Disposition Plan: transfer to tele bed  Consultants: Cardiology Nephrology   Procedures: None  Antibiotics: None  DVT prophylaxis: SQ heparin  HPI/Subjective: The patient is sitting comfortably in a bedside chair.  He denies chest pain fevers chills nausea or vomiting at the present time.  I have had a very frank discussion with him concerning the absolute need for stroke compliance with medications and close medical followup for management of all of his chronic progressive medical conditions.  Objective: Blood pressure 158/84, pulse 71, temperature 98.5 F (36.9 C), temperature source Oral, resp. rate 14, height 5\' 11"  (1.803 m), weight 134.9 kg (297 lb 6.4 oz), SpO2 91.00%.  Intake/Output Summary (Last 24 hours) at 09/23/12 1446 Last data filed at 09/23/12 1214  Gross per 24 hour  Intake   1148 ml  Output   2075 ml  Net   -927 ml    Exam: General: No acute respiratory distress Lungs: Clear to auscultation bilaterally without wheezes or crackles Cardiovascular: Regular rate and rhythm without murmur gallop or rub normal S1 and S2 Abdomen: Obese, nontender, nondistended, soft, bowel sounds positive, no rebound, no ascites, no appreciable mass Extremities: No significant cyanosis/clubbing, 1+ bilateral lower extremity edema to the knees  Data Reviewed: Basic Metabolic Panel:6  Recent Labs Lab 09/21/12 09/21/12 2120 09/22/12 0430 09/23/12 0415  NA 134* 139 138 133*  K 3.6 3.4* 3.5 4.0  CL 97 102 102 98  CO2 23 25 23 26   GLUCOSE 366* 191*  203* 221*  BUN 38* 35* 37* 44*  CREATININE 3.84* 4.03* 4.16* 4.85*  CALCIUM 9.3 9.1 8.9 8.8  PHOS  --   --   --  7.1*   Liver Function Tests:  Recent Labs Lab 09/21/12 09/23/12 0415  AST 17  --   ALT 17  --   ALKPHOS 123*  --   BILITOT 0.3  --   PROT  7.5  --   ALBUMIN 3.4* 2.8*   CBC:  Recent Labs Lab 09/21/12 09/21/12 2120 09/22/12 0430  WBC 16.1* 14.4* 10.6*  NEUTROABS 10.6*  --   --   HGB 13.9 13.5 11.3*  HCT 39.0 38.1* 33.0*  MCV 78.9 81.1 81.3  PLT 393 390 343   Cardiac Enzymes:  Recent Labs Lab 09/22/12 0430 09/22/12 1001 09/22/12 1634  TROPONINI 1.33* 1.49* 0.85*   BNP (last 3 results)  Recent Labs  09/21/12 2120  PROBNP 7377.0*   CBG:  Recent Labs Lab 09/22/12 0706 09/22/12 1222 09/22/12 1731 09/22/12 2239 09/23/12 0724  GLUCAP 214* 215* 168* 247* 231*    Recent Results (from the past 240 hour(s))  MRSA PCR SCREENING     Status: None   Collection Time    09/22/12  3:21 AM      Result Value Range Status   MRSA by PCR NEGATIVE  NEGATIVE Final   Comment:            The GeneXpert MRSA Assay (FDA     approved for NASAL specimens     only), is one component of a     comprehensive MRSA colonization     surveillance program. It is not     intended to diagnose MRSA     infection nor to guide or     monitor treatment for     MRSA infections.     Studies:  Recent x-ray studies have been reviewed in detail by the Attending Physician  Scheduled Meds:  Scheduled Meds: . amLODipine  10 mg Oral Daily  . aspirin EC  81 mg Oral Daily  . calcium carbonate  400 mg of elemental calcium Oral TID WC  . escitalopram  20 mg Oral Daily  . ferric gluconate (FERRLECIT/NULECIT) IV  125 mg Intravenous Daily  . furosemide  40 mg Oral BID  . heparin subcutaneous  5,000 Units Subcutaneous Q8H  . hydrALAZINE  100 mg Oral BID  . insulin aspart  0-15 Units Subcutaneous TID WC  . insulin aspart  0-5 Units Subcutaneous QHS  . [START ON 09/24/2012] insulin glargine  45 Units Subcutaneous Daily  . isosorbide dinitrate  10 mg Oral BID  . labetalol  200 mg Oral BID  . simvastatin  10 mg Oral q1800  . sodium chloride  3 mL Intravenous Q12H   Continuous Infusions:    Time spent on care of this patient: 35  minutes   [facility] T  [name] Office  [phone] Pager - Text Page per Loretha Stapler as per below:  On-Call/Text Page:      [email or web address]      password [email or web address]  If 7PM-7AM, please contact night-coverage [email or web address] Password [facility] 09/23/2012, 2:46 PM   LOS: 2 days

## 2012-09-24 DIAGNOSIS — I5022 Chronic systolic (congestive) heart failure: Secondary | ICD-10-CM

## 2012-09-24 DIAGNOSIS — R079 Chest pain, unspecified: Secondary | ICD-10-CM

## 2012-09-24 DIAGNOSIS — I509 Heart failure, unspecified: Secondary | ICD-10-CM

## 2012-09-24 LAB — GLUCOSE, CAPILLARY: Glucose-Capillary: 246 mg/dL — ABNORMAL HIGH (ref 70–99)

## 2012-09-24 LAB — RENAL FUNCTION PANEL
Calcium: 8.8 mg/dL (ref 8.4–10.5)
GFR calc Af Amer: 17 mL/min — ABNORMAL LOW (ref >90)
GFR calc non Af Amer: 15 mL/min — ABNORMAL LOW (ref >90)
Phosphorus: 5.5 mg/dL — ABNORMAL HIGH (ref 2.3–4.6)
Sodium: 134 mEq/L — ABNORMAL LOW (ref 135–145)

## 2012-09-24 LAB — PARATHYROID HORMONE, INTACT (NO CA): PTH: 204 pg/mL — ABNORMAL HIGH (ref 14.0–72.0)

## 2012-09-24 NOTE — Progress Notes (Signed)
SUBJECTIVE:  No chest pain.  No SOB.   PHYSICAL EXAM Filed Vitals:   09/23/12 2119 09/23/12 2129 09/24/12 0537 09/24/12 1011  BP: 143/60 143/60 168/78 162/83  Pulse:  81 93 90  Temp:  97.8 F (36.6 C) 98.2 F (36.8 C) 98.6 F (37 C)  TempSrc:  Oral Oral Oral  Resp:  18 18 20   Height:      Weight:   301 lb 5.9 oz (136.7 kg)   SpO2:  95% 95% 95%   General:  No distress Lungs:  Clear Heart:  RRR, no murmur Abdomen: Positive bowel sounds, no rebound no guarding Extremities:  Mild leg edema.   LABS: Lab Results  Component Value Date   CKTOTAL 41 12/21/2008   CKMB 2.2 12/21/2008   TROPONINI 0.85* 09/22/2012   Results for orders placed during the hospital encounter of 09/21/12 (from the past 24 hour(s))  GLUCOSE, CAPILLARY     Status: Abnormal   Collection Time    09/23/12 12:07 PM      Result Value Range   Glucose-Capillary 267 (*) 70 - 99 mg/dL   Comment 1 Documented in Chart     Comment 2 Notify RN    GLUCOSE, CAPILLARY     Status: Abnormal   Collection Time    09/23/12  5:09 PM      Result Value Range   Glucose-Capillary 214 (*) 70 - 99 mg/dL   Comment 1 Documented in Chart     Comment 2 Notify RN    GLUCOSE, CAPILLARY     Status: Abnormal   Collection Time    09/23/12  8:53 PM      Result Value Range   Glucose-Capillary 193 (*) 70 - 99 mg/dL   Comment 1 Notify RN    RENAL FUNCTION PANEL     Status: Abnormal   Collection Time    09/24/12  5:02 AM      Result Value Range   Sodium 134 (*) 135 - 145 mEq/L   Potassium 4.3  3.5 - 5.1 mEq/L   Chloride 99  96 - 112 mEq/L   CO2 23  19 - 32 mEq/L   Glucose, Bld 195 (*) 70 - 99 mg/dL   BUN 43 (*) 6 - 23 mg/dL   Creatinine, Ser 4.09 (*) 0.50 - 1.35 mg/dL   Calcium 8.8  8.4 - 81.1 mg/dL   Phosphorus 5.5 (*) 2.3 - 4.6 mg/dL   Albumin 2.9 (*) 3.5 - 5.2 g/dL   GFR calc non Af Amer 15 (*) >90 mL/min   GFR calc Af Amer 17 (*) >90 mL/min  GLUCOSE, CAPILLARY     Status: Abnormal   Collection Time    09/24/12  6:15 AM        Result Value Range   Glucose-Capillary 190 (*) 70 - 99 mg/dL    Intake/Output Summary (Last 24 hours) at 09/24/12 1045 Last data filed at 09/24/12 0802  Gross per 24 hour  Intake   1683 ml  Output   4025 ml  Net  -2342 ml    ASSESSMENT AND PLAN:   Hypertensive urgency:    BP is better controlled on current therapy.  I have discussed this with him.  We discussed salt and fluid restriction.   Chronic systolic congestive heart failure/EF 40% PER echo 2010:  We discussed conservative measures for residual extremity edema.   Elevation of cardiac enzymes:  No further work up.  Demand ischemia.    Rollene Rotunda  09/24/2012 10:45 AM

## 2012-09-24 NOTE — Progress Notes (Signed)
Patient BP elevated 168/78.  Patient resting comfortable in no acute distress. NP Schorr notified. RN will continue to monitor. Louretta Parma, RN

## 2012-09-24 NOTE — Progress Notes (Signed)
[facility] - PROGRESS NOTE Resident Note   Please see below for attending addendum to resident note.  Subjective:   Pt feels "great" today. Denies chest pain, headache. BP controlled in a better range now. Pt has good UOP. Cr is down today at 4.63 (from 4.85) yesterday. Wt is down 1 lb from yesterday. I actually think he is autodiuresing, only on a small dose of lasix with 4 liters UOP  Objective:    Vital Signs:   Temp:  [97.4 F (36.3 C)-98.6 F (37 C)] 98.6 F (37 C) (03/27 1011) Pulse Rate:  [71-93] 90 (03/27 1011) Resp:  [13-22] 20 (03/27 1011) BP: (123-168)/(54-83) 162/83 mmHg (03/27 1011) SpO2:  [91 %-95 %] 95 % (03/27 1011) Weight:  [136.7 kg (301 lb 5.9 oz)-137.2 kg (302 lb 7.5 oz)] 136.7 kg (301 lb 5.9 oz) (03/27 0537) Last BM Date: 09/22/12  24-hour weight change: Weight change:   Weight trends: Filed Weights   09/22/12 0245 09/23/12 1724 09/24/12 0537  Weight: 134.9 kg (297 lb 6.4 oz) 137.2 kg (302 lb 7.5 oz) 136.7 kg (301 lb 5.9 oz)    Intake/Output:  03/26 0701 - 03/27 0700 In: 1717.5 [P.O.:1560; I.V.:47.5; IV Piggyback:110] Out: 4025 [Urine:4025]  Physical Exam: General: Vital signs reviewed and noted. Well-developed, well-nourished, in no acute distress; alert, appropriate and cooperative throughout examination.  Lungs:  Normal respiratory effort. Clear to auscultation BL without crackles or wheezes.  Heart: RRR. S1 and S2 normal without gallop, murmur, or rubs.  Abdomen:  BS normoactive. Soft, Nondistended, non-tender.  No masses or organomegaly.  Extremities: 1+ pretibial edema.     Labs: Basic Metabolic Panel:  Recent Labs Lab 09/22/12 0430 09/23/12 0415 09/24/12 0502  NA 138 133* 134*  K 3.5 4.0 4.3  CL 102 98 99  CO2 23 26 23   GLUCOSE 203* 221* 195*  BUN 37* 44* 43*  CREATININE 4.16* 4.85* 4.63*  CALCIUM 8.9 8.8 8.8  PHOS  --  7.1* 5.5*    Liver Function Tests:  Recent Labs Lab 09/21/12 09/23/12 0415  09/24/12 0502  AST 17  --   --   ALT 17  --   --   ALKPHOS 123*  --   --   BILITOT 0.3  --   --   PROT 7.5  --   --   ALBUMIN 3.4* 2.8* 2.9*   No results found for this basename: LIPASE, AMYLASE,  in the last 168 hours No results found for this basename: AMMONIA,  in the last 168 hours  CBC:  Recent Labs Lab 09/21/12 09/21/12 2120 09/22/12 0430  WBC 16.1* 14.4* 10.6*  NEUTROABS 10.6*  --   --   HGB 13.9 13.5 11.3*  HCT 39.0 38.1* 33.0*  MCV 78.9 81.1 81.3  PLT 393 390 343    Cardiac Enzymes:  Recent Labs Lab 09/22/12 0430 09/22/12 1001 09/22/12 1634  TROPONINI 1.33* 1.49* 0.85*    BNP: No components found with this basename: POCBNP,   CBG:  Recent Labs Lab 09/23/12 0724 09/23/12 1207 09/23/12 1709 09/23/12 2053 09/24/12 0615  GLUCAP 231* 267* 214* 193* 190*    Microbiology: Results for orders placed during the hospital encounter of 09/21/12  MRSA PCR SCREENING     Status: None   Collection Time    09/22/12  3:21 AM      Result Value Range Status   MRSA by PCR NEGATIVE  NEGATIVE Final   Comment:  The GeneXpert MRSA Assay (FDA     approved for NASAL specimens     only), is one component of a     comprehensive MRSA colonization     surveillance program. It is not     intended to diagnose MRSA     infection nor to guide or     monitor treatment for     MRSA infections.    Coagulation Studies: No results found for this basename: LABPROT, INR,  in the last 72 hours  Urinalysis:    Component Value Date/Time   COLORURINE YELLOW 09/22/2012 1805   APPEARANCEUR HAZY* 09/22/2012 1805   LABSPEC 1.022 09/22/2012 1805   PHURINE 5.0 09/22/2012 1805   GLUCOSEU 250* 09/22/2012 1805   HGBUR SMALL* 09/22/2012 1805   BILIRUBINUR NEGATIVE 09/22/2012 1805   KETONESUR NEGATIVE 09/22/2012 1805   PROTEINUR >300* 09/22/2012 1805   UROBILINOGEN 0.2 09/22/2012 1805   NITRITE NEGATIVE 09/22/2012 1805   LEUKOCYTESUR NEGATIVE 09/22/2012 1805     Imaging: US  Renal  09/22/2012  *RADIOLOGY REPORT*  Clinical Data: Acute renal injury.  RENAL/URINARY TRACT ULTRASOUND COMPLETE  Comparison:  MRI 12/22/2006.  Findings:  Right Kidney:  10.4 cm in length.  Mild renal cortical thinning but normal echogenicity.  A 1.4 x 1.2 x 1.6 cm lower pole cyst is noted.  No hydronephrosis.  Left Kidney:  11.5 cm in length.  Diffuse increased echogenicity but normal renal cortical thickness.  No hydronephrosis, renal calculus or mass.  Bladder:  Normal.  Additional findings:  Fatty infiltration of the liver is noted.  IMPRESSION:  1.  Mild increased echogenicity of the left kidney.  Mild right renal cortical thinning. 2.  No hydronephrosis.   Original Report Authenticated By: Rudie Meyer, M.D.       Medications:    Infusions:    Scheduled Medications: . amLODipine  10 mg Oral Daily  . aspirin EC  81 mg Oral Daily  . calcium carbonate  400 mg of elemental calcium Oral TID WC  . escitalopram  20 mg Oral Daily  . ferric gluconate (FERRLECIT/NULECIT) IV  125 mg Intravenous Daily  . heparin subcutaneous  5,000 Units Subcutaneous Q8H  . hydrALAZINE  100 mg Oral BID  . insulin aspart  0-15 Units Subcutaneous TID WC  . insulin aspart  0-5 Units Subcutaneous QHS  . insulin glargine  45 Units Subcutaneous Daily  . isosorbide dinitrate  10 mg Oral BID  . labetalol  200 mg Oral BID  . simvastatin  10 mg Oral q1800    PRN Medications: acetaminophen, ALPRAZolam, ondansetron (ZOFRAN) IV, ondansetron, oxyCODONE   Assessment/ Plan:    Pt is a 42 y.o. 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   1) AKI vs. CKD - Creatinine finally trending down a little bit. Will hold lasix as patient is likely autodiuresing following insult to kidneys from malignant  HTN; plenty of UOP and BUN and Cr are coming back down 4.63. Anticipate hopefully further improvement in the setting of better BP  2) HTN/vol - Pt will need control of HTN meds as OP. As of now, BP is ideal, we want it in the 140-170 range so that it is not taken down too much acutely. I  hope to make his regimen as simple as possible to improve compliance. clonidine d/cd by primary which I agree with and hopefully he will not have a significant rebound HTN. I also have held lasix just because he is autodiuresing and I dont want him to get too dry.   Recommend keeping him here for one more day due to discontinuation of these meds in order to make sure he does not become acutely hypertensive.   3) Anemia - Pt has been given Nulicet for anemia. No need for ESA at this time.   4) Bones - awaiting PTH level- can follow as OP  Dispo- Pt has an appointment to see me on April 21st at 3:00pm as OP to follow his renal function and his BP.  He also was asking for recs on a new PCP, I offered [facility].      Length of Stay: 3 days  Patient history and plan of care reviewed with attending, Dr. Annie Sable.   Winnifred Friar, Student-PA  09/24/2012, 11:12 AM  Patient seen and examined, agree with above note with above modifications. Renal function appears to have peaked from the insult of malignant HTN and he is autodiuresing post injury.  Tryin to keep his OP BP regimen as simple as possible to improve compliance.  I would rec to maybe keep him here until AM to make sure he does not have sigificant rebound HTN.  IF BP and renal function stable tomorrow I am ok with discharge and we will inform patient of his follow up appt with me.   Annie Sable, MD 09/24/2012

## 2012-09-24 NOTE — Progress Notes (Signed)
[name]   Azlan Hanway Dodds ZOX:[id number] DOB: June 05, 1971 DOA: 09/21/2012 PCP: Pola Corn, MD  Brief narrative: 42 y.o. male who presented to the ER on 3/24 with c/o chest pain in his center chest and epigastric area, 2/10 in severity, associated with left arm cramping  associated with very elevated blood pressure (249/137).  Chest pain resolved after GI cocktail and L arm cramping resolved after his BP was controlled with nitro gtt. Patient was admitted by the hospitalist team but left AGAINST MEDICAL ADVICE.  Patient went home and came back with exactly similar complaints. Patient told the ER physician that he wanted to get admitted this time. His wife accompanied him who forced him to come back to the ER. Patient's blood pressure was elevated and his troponins bumped to 0.77. He was started on a heparin drip and a nitro drip in the ER. Hospitalists team was called to admit the patient.   Assessment/Plan:  Hypertensive urgency, malignant -appears that difficulty w/ BP control was simply due to noncompliance -clonidine is a very poor choice in a pt w/ intermittent compliance due to a propensity for rebound HTN when doses are missed - will therefore d/c clonidine  -add imdur to hydralazine in setting of inability to use ACE/ARB (due to kidney disease) in this diabetic who likely also has heart disease  Acute kidney injury -Baseline creatinine uncertain -Nephrology following  -cr starting to climb down.  Uncontrolled DM2  -Patient prescribed 45 units of Lantus at home -CBG remains poorly controlled - adjust tx plan and follow   Chest pain/Acute coronary syndrome -Likely secondary to malignant hypertension -no further workup recommended at this time by Cardiology -presumed demand ischemia from hypertensive urgency.  History of partial Right nephrectomy for renal mass (2008)  Left ventricular hypertrophy (moderate)/ Chronic systolic congestive heart failure/EF 40% PER echo  2010 -Patient advised of correlation between uncontrolled blood pressure and heart failure   Code Status: Full  Family Communication: spoke w/ pt and wife at bedside  Disposition Plan: likely DC home in am if BP and Cr improving. Consultants: Cardiology Nephrology   Procedures: None  Antibiotics: None  DVT prophylaxis: SQ heparin  HPI/Subjective: The patient is sitting comfortably in a bedside chair.  He denies chest pain fevers chills nausea or vomiting at the present time.  I have had a very frank discussion with him concerning the absolute need for stroke compliance with medications and close medical followup for management of all of his chronic progressive medical conditions.  Objective: Blood pressure 171/79, pulse 89, temperature 98.2 F (36.8 C), temperature source Oral, resp. rate 20, height 5\' 11"  (1.803 m), weight 136.7 kg (301 lb 5.9 oz), SpO2 95.00%.  Intake/Output Summary (Last 24 hours) at 09/24/12 1558 Last data filed at 09/24/12 1436  Gross per 24 hour  Intake    960 ml  Output   3875 ml  Net  -2915 ml    Exam: General: No acute respiratory distress Lungs: Clear to auscultation bilaterally without wheezes or crackles Cardiovascular: Regular rate and rhythm without murmur gallop or rub normal S1 and S2 Abdomen: Obese, nontender, nondistended, soft, bowel sounds positive, no rebound, no ascites, no appreciable mass Extremities: No significant cyanosis/clubbing, 1+ bilateral lower extremity edema to the knees  Data Reviewed: Basic Metabolic Panel:6  Recent Labs Lab 09/21/12 09/21/12 2120 09/22/12 0430 09/23/12 0415 09/24/12 0502  NA 134* 139 138 133* 134*  K 3.6 3.4* 3.5 4.0 4.3  CL 97 102 102 98 99  CO2  23 25 23 26 23   GLUCOSE 366* 191* 203* 221* 195*  BUN 38* 35* 37* 44* 43*  CREATININE 3.84* 4.03* 4.16* 4.85* 4.63*  CALCIUM 9.3 9.1 8.9 8.8 8.8  PHOS  --   --   --  7.1* 5.5*   Liver Function Tests:  Recent Labs Lab 09/21/12 09/23/12 0415  09/24/12 0502  AST 17  --   --   ALT 17  --   --   ALKPHOS 123*  --   --   BILITOT 0.3  --   --   PROT 7.5  --   --   ALBUMIN 3.4* 2.8* 2.9*   CBC:  Recent Labs Lab 09/21/12 09/21/12 2120 09/22/12 0430  WBC 16.1* 14.4* 10.6*  NEUTROABS 10.6*  --   --   HGB 13.9 13.5 11.3*  HCT 39.0 38.1* 33.0*  MCV 78.9 81.1 81.3  PLT 393 390 343   Cardiac Enzymes:  Recent Labs Lab 09/22/12 0430 09/22/12 1001 09/22/12 1634  TROPONINI 1.33* 1.49* 0.85*   BNP (last 3 results)  Recent Labs  09/21/12 2120  PROBNP 7377.0*   CBG:  Recent Labs Lab 09/23/12 1207 09/23/12 1709 09/23/12 2053 09/24/12 0615 09/24/12 1100  GLUCAP 267* 214* 193* 190* 246*    Recent Results (from the past 240 hour(s))  MRSA PCR SCREENING     Status: None   Collection Time    09/22/12  3:21 AM      Result Value Range Status   MRSA by PCR NEGATIVE  NEGATIVE Final   Comment:            The GeneXpert MRSA Assay (FDA     approved for NASAL specimens     only), is one component of a     comprehensive MRSA colonization     surveillance program. It is not     intended to diagnose MRSA     infection nor to guide or     monitor treatment for     MRSA infections.      Scheduled Meds:  Scheduled Meds: . amLODipine  10 mg Oral Daily  . aspirin EC  81 mg Oral Daily  . calcium carbonate  400 mg of elemental calcium Oral TID WC  . escitalopram  20 mg Oral Daily  . heparin subcutaneous  5,000 Units Subcutaneous Q8H  . hydrALAZINE  100 mg Oral BID  . insulin aspart  0-15 Units Subcutaneous TID WC  . insulin aspart  0-5 Units Subcutaneous QHS  . insulin glargine  45 Units Subcutaneous Daily  . isosorbide dinitrate  10 mg Oral BID  . labetalol  200 mg Oral BID  . simvastatin  10 mg Oral q1800   Continuous Infusions:    Time spent on care of this patient: 35 minutes   Chaya Jan Pager: [phone] [name] Office  [phone] Pager - Text Page per Loretha Stapler as per  below:  On-Call/Text Page:      [email or web address]      password [email or web address]  If 7PM-7AM, please contact night-coverage [email or web address] Password [facility] 09/24/2012, 3:58 PM   LOS: 3 days

## 2012-09-24 NOTE — Progress Notes (Signed)
Inpatient Diabetes Program Recommendations  AACE/ADA: New Consensus Statement on Inpatient Glycemic Control (2013)  Target Ranges:  Prepandial:   less than 140 mg/dL      Peak postprandial:   less than 180 mg/dL (1-2 hours)      Critically ill patients:  140 - 180 mg/dL   Reason for Visit: Hyperglycemia Results for ORA, MCNATT (MRN [id number]) as of 09/24/2012 16:03  Ref. Range 09/23/2012 17:09 09/23/2012 20:53 09/24/2012 06:15 09/24/2012 11:00  Glucose-Capillary Latest Range: 70-99 mg/dL 409 (H) 811 (H) 914 (H) 246 (H)     Inpatient Diabetes Program Recommendations Insulin - Meal Coverage: Add meal coverage insulin - Novolog 4 units tidwc if pt eats >50% meal  Note: Will continue to follow.  Thank you. Ailene Ards, RD, LDN, CDE Inpatient Diabetes Coordinator [phone]

## 2012-09-25 DIAGNOSIS — R748 Abnormal levels of other serum enzymes: Secondary | ICD-10-CM

## 2012-09-25 LAB — BASIC METABOLIC PANEL
BUN: 38 mg/dL — ABNORMAL HIGH (ref 6–23)
CO2: 26 mEq/L (ref 19–32)
GFR calc non Af Amer: 15 mL/min — ABNORMAL LOW (ref >90)
Glucose, Bld: 178 mg/dL — ABNORMAL HIGH (ref 70–99)
Potassium: 3.8 mEq/L (ref 3.5–5.1)

## 2012-09-25 LAB — GLUCOSE, CAPILLARY
Glucose-Capillary: 205 mg/dL — ABNORMAL HIGH (ref 70–99)
Glucose-Capillary: 163 mg/dL — ABNORMAL HIGH (ref 70–99)
Glucose-Capillary: 176 mg/dL — ABNORMAL HIGH (ref 70–99)

## 2012-09-25 MED ORDER — FUROSEMIDE 40 MG PO TABS
40.0000 mg | ORAL_TABLET | Freq: Two times a day (BID) | ORAL | Status: DC
  Administered 2012-09-25 – 2012-09-26 (×3): 40 mg via ORAL
  Filled 2012-09-25 (×5): qty 1

## 2012-09-25 MED ORDER — LABETALOL HCL 200 MG PO TABS
400.0000 mg | ORAL_TABLET | Freq: Two times a day (BID) | ORAL | Status: DC
  Administered 2012-09-25 – 2012-09-26 (×3): 400 mg via ORAL
  Filled 2012-09-25 (×4): qty 2

## 2012-09-25 MED ORDER — HYDRALAZINE HCL 20 MG/ML IJ SOLN
10.0000 mg | Freq: Once | INTRAMUSCULAR | Status: AC
  Administered 2012-09-25: 10 mg via INTRAVENOUS
  Filled 2012-09-25: qty 1

## 2012-09-25 NOTE — Progress Notes (Signed)
[name]   Ramez Arrona Noteboom AVW:[id number] DOB: 09-13-1970 DOA: 09/21/2012 PCP: Pola Corn, MD  Brief narrative: 42 y.o. male who presented to the ER on 3/24 with c/o chest pain in his center chest and epigastric area, 2/10 in severity, associated with left arm cramping  associated with very elevated blood pressure (249/137).  Chest pain resolved after GI cocktail and L arm cramping resolved after his BP was controlled with nitro gtt. Patient was admitted by the hospitalist team but left AGAINST MEDICAL ADVICE.  Patient went home and came back with exactly similar complaints. Patient told the ER physician that he wanted to get admitted this time. His wife accompanied him who forced him to come back to the ER. Patient's blood pressure was elevated and his troponins bumped to 0.77. He was started on a heparin drip and a nitro drip in the ER. Hospitalists team was called to admit the patient.   Assessment/Plan:  Hypertensive urgency, malignant -appears that difficulty w/ BP control was simply due to noncompliance -clonidine is a very poor choice in a pt w/ intermittent compliance due to a propensity for rebound HTN when doses are missed - will therefore d/c clonidine  -add imdur to hydralazine in setting of inability to use ACE/ARB (due to kidney disease) in this diabetic who likely also has heart disease -Given increasing BP overnight, renal has increased his labetalol and added lasix.  Acute kidney injury -Baseline creatinine uncertain -Nephrology following  -cr starting to climb down. 4.63-->4.46  Uncontrolled DM2  -Patient prescribed 45 units of Lantus at home -Improved control.  Chest pain/Acute coronary syndrome -Likely secondary to malignant hypertension -no further workup recommended at this time by Cardiology -presumed demand ischemia from hypertensive urgency.  History of partial Right nephrectomy for renal mass (2008)  Left ventricular hypertrophy (moderate)/  Chronic systolic congestive heart failure/EF 40% PER echo 2010 -Patient advised of correlation between uncontrolled blood pressure and heart failure   Code Status: Full  Family Communication: spoke w/ pt and wife at bedside  Disposition Plan: likely DC home in am if BP and Cr improving. Consultants: Cardiology Nephrology   Procedures: None  Antibiotics: None  DVT prophylaxis: SQ heparin  HPI/Subjective: The patient is sitting comfortably in a bedside chair.  He denies chest pain fevers chills nausea or vomiting at the present time.  I have had a very frank discussion with him concerning the absolute need for stroke compliance with medications and close medical followup for management of all of his chronic progressive medical conditions.  Objective: Blood pressure 133/57, pulse 87, temperature 98.8 F (37.1 C), temperature source Oral, resp. rate 20, height 5\' 11"  (1.803 m), weight 134.4 kg (296 lb 4.8 oz), SpO2 90.00%.  Intake/Output Summary (Last 24 hours) at 09/25/12 1513 Last data filed at 09/25/12 1400  Gross per 24 hour  Intake    720 ml  Output    425 ml  Net    295 ml    Exam: General: No acute respiratory distress Lungs: Clear to auscultation bilaterally without wheezes or crackles Cardiovascular: Regular rate and rhythm without murmur gallop or rub normal S1 and S2 Abdomen: Obese, nontender, nondistended, soft, bowel sounds positive, no rebound, no ascites, no appreciable mass Extremities: No significant cyanosis/clubbing, 1+ bilateral lower extremity edema to the knees  Data Reviewed: Basic Metabolic Panel:6  Recent Labs Lab 09/21/12 2120 09/22/12 0430 09/23/12 0415 09/24/12 0502 09/25/12 0600  NA 139 138 133* 134* 139  K 3.4* 3.5 4.0 4.3 3.8  CL 102 102 98 99 101  CO2 25 23 26 23 26   GLUCOSE 191* 203* 221* 195* 178*  BUN 35* 37* 44* 43* 38*  CREATININE 4.03* 4.16* 4.85* 4.63* 4.46*  CALCIUM 9.1 8.9 8.8 8.8 9.5  PHOS  --   --  7.1* 5.5*  --     Liver Function Tests:  Recent Labs Lab 09/21/12 09/23/12 0415 09/24/12 0502  AST 17  --   --   ALT 17  --   --   ALKPHOS 123*  --   --   BILITOT 0.3  --   --   PROT 7.5  --   --   ALBUMIN 3.4* 2.8* 2.9*   CBC:  Recent Labs Lab 09/21/12 09/21/12 2120 09/22/12 0430  WBC 16.1* 14.4* 10.6*  NEUTROABS 10.6*  --   --   HGB 13.9 13.5 11.3*  HCT 39.0 38.1* 33.0*  MCV 78.9 81.1 81.3  PLT 393 390 343   Cardiac Enzymes:  Recent Labs Lab 09/22/12 0430 09/22/12 1001 09/22/12 1634  TROPONINI 1.33* 1.49* 0.85*   BNP (last 3 results)  Recent Labs  09/21/12 2120  PROBNP 7377.0*   CBG:  Recent Labs Lab 09/24/12 1100 09/24/12 1608 09/24/12 2100 09/25/12 0643 09/25/12 1110  GLUCAP 246* 122* 161* 173* 205*    Recent Results (from the past 240 hour(s))  MRSA PCR SCREENING     Status: None   Collection Time    09/22/12  3:21 AM      Result Value Range Status   MRSA by PCR NEGATIVE  NEGATIVE Final   Comment:            The GeneXpert MRSA Assay (FDA     approved for NASAL specimens     only), is one component of a     comprehensive MRSA colonization     surveillance program. It is not     intended to diagnose MRSA     infection nor to guide or     monitor treatment for     MRSA infections.      Scheduled Meds:  Scheduled Meds: . amLODipine  10 mg Oral Daily  . aspirin EC  81 mg Oral Daily  . calcium carbonate  400 mg of elemental calcium Oral TID WC  . escitalopram  20 mg Oral Daily  . furosemide  40 mg Oral BID  . heparin subcutaneous  5,000 Units Subcutaneous Q8H  . hydrALAZINE  100 mg Oral BID  . insulin aspart  0-15 Units Subcutaneous TID WC  . insulin aspart  0-5 Units Subcutaneous QHS  . insulin glargine  45 Units Subcutaneous Daily  . isosorbide dinitrate  10 mg Oral BID  . labetalol  400 mg Oral BID  . simvastatin  10 mg Oral q1800   Continuous Infusions:    Time spent on care of this patient: 35 minutes   Chaya Jan Pager: [phone] [name] Office  [phone] Pager - Text Page per Loretha Stapler as per below:  On-Call/Text Page:      [email or web address]      password [email or web address]  If 7PM-7AM, please contact night-coverage [email or web address] Password [email or web address] 09/25/2012, 3:13 PM   LOS: 4 days

## 2012-09-25 NOTE — Progress Notes (Signed)
[facility] - PROGRESS NOTE Resident Note   Please see below for attending addendum to resident note.  Subjective:   Pt feels okay today. Denies chest pain, has a mild headache and lightheadedness. BP spiked last night max 196/100. Could be due rebound HTN from d/c of Clonidine yesterday. Renal function continues to improve Cr 4.46 from 4.63 yesterday and BUN 38 from 43 yesterday, wt down 6 lbs. PT still has good UOP. Still has significant edema BL, R>L.  Objective:    Vital Signs:   Temp:  [98.1 F (36.7 C)-98.6 F (37 C)] 98.1 F (36.7 C) (03/28 0519) Pulse Rate:  [88-99] 91 (03/28 0836) Resp:  [18-20] 20 (03/28 0519) BP: (162-196)/(76-100) 185/91 mmHg (03/28 0836) SpO2:  [95 %-98 %] 96 % (03/28 0836) Weight:  [134.4 kg (296 lb 4.8 oz)] 134.4 kg (296 lb 4.8 oz) (03/28 0519) Last BM Date: 09/24/12  24-hour weight change: Weight change: -2.8 kg (-6 lb 2.8 oz)  Weight trends: Filed Weights   09/23/12 1724 09/24/12 0537 09/25/12 0519  Weight: 137.2 kg (302 lb 7.5 oz) 136.7 kg (301 lb 5.9 oz) 134.4 kg (296 lb 4.8 oz)    Intake/Output:  03/27 0701 - 03/28 0700 In: 720 [P.O.:720] Out: 1875 [Urine:1875]  Physical Exam: General: Vital signs reviewed and noted. Well-developed, well-nourished, in no acute distress; alert, appropriate and cooperative throughout examination.  Lungs:  Normal respiratory effort. Clear to auscultation BL without crackles or wheezes.  Heart: RRR. S1 and S2 normal without gallop, murmur, or rubs.  Abdomen:  BS normoactive. Soft, Nondistended, non-tender.  No masses or organomegaly.  Extremities: 2+  pretibial edema, ceasing at the level of the knee.      Labs: Basic Metabolic Panel:  Recent Labs Lab 09/22/12 0430 09/23/12 0415 09/24/12 0502 09/25/12 0600  NA 138 133* 134* 139  K 3.5 4.0 4.3 3.8  CL 102 98 99 101  CO2 23 26 23 26   GLUCOSE 203* 221* 195* 178*  BUN 37* 44* 43* 38*  CREATININE 4.16* 4.85* 4.63* 4.46*  CALCIUM  8.9 8.8 8.8 9.5  PHOS  --  7.1* 5.5*  --     Liver Function Tests:  Recent Labs Lab 09/21/12 09/23/12 0415 09/24/12 0502  AST 17  --   --   ALT 17  --   --   ALKPHOS 123*  --   --   BILITOT 0.3  --   --   PROT 7.5  --   --   ALBUMIN 3.4* 2.8* 2.9*   No results found for this basename: LIPASE, AMYLASE,  in the last 168 hours No results found for this basename: AMMONIA,  in the last 168 hours  CBC:  Recent Labs Lab 09/21/12 09/21/12 2120 09/22/12 0430  WBC 16.1* 14.4* 10.6*  NEUTROABS 10.6*  --   --   HGB 13.9 13.5 11.3*  HCT 39.0 38.1* 33.0*  MCV 78.9 81.1 81.3  PLT 393 390 343    Cardiac Enzymes:  Recent Labs Lab 09/22/12 0430 09/22/12 1001 09/22/12 1634  TROPONINI 1.33* 1.49* 0.85*    BNP: No components found with this basename: POCBNP,   CBG:  Recent Labs Lab 09/24/12 0615 09/24/12 1100 09/24/12 1608 09/24/12 2100 09/25/12 0643  GLUCAP 190* 246* 122* 161* 173*    Microbiology: Results for orders placed during the hospital encounter of 09/21/12  MRSA PCR SCREENING     Status: None   Collection Time    09/22/12  3:21 AM  Result Value Range Status   MRSA by PCR NEGATIVE  NEGATIVE Final   Comment:            The GeneXpert MRSA Assay (FDA     approved for NASAL specimens     only), is one component of a     comprehensive MRSA colonization     surveillance program. It is not     intended to diagnose MRSA     infection nor to guide or     monitor treatment for     MRSA infections.    Coagulation Studies: No results found for this basename: LABPROT, INR,  in the last 72 hours  Urinalysis:    Component Value Date/Time   COLORURINE YELLOW 09/22/2012 1805   APPEARANCEUR HAZY* 09/22/2012 1805   LABSPEC 1.022 09/22/2012 1805   PHURINE 5.0 09/22/2012 1805   GLUCOSEU 250* 09/22/2012 1805   HGBUR SMALL* 09/22/2012 1805   BILIRUBINUR NEGATIVE 09/22/2012 1805   KETONESUR NEGATIVE 09/22/2012 1805   PROTEINUR >300* 09/22/2012 1805   UROBILINOGEN  0.2 09/22/2012 1805   NITRITE NEGATIVE 09/22/2012 1805   LEUKOCYTESUR NEGATIVE 09/22/2012 1805     Imaging: No results found.    Medications:    Infusions:    Scheduled Medications: . amLODipine  10 mg Oral Daily  . aspirin EC  81 mg Oral Daily  . calcium carbonate  400 mg of elemental calcium Oral TID WC  . escitalopram  20 mg Oral Daily  . heparin subcutaneous  5,000 Units Subcutaneous Q8H  . hydrALAZINE  100 mg Oral BID  . insulin aspart  0-15 Units Subcutaneous TID WC  . insulin aspart  0-5 Units Subcutaneous QHS  . insulin glargine  45 Units Subcutaneous Daily  . isosorbide dinitrate  10 mg Oral BID  . labetalol  200 mg Oral BID  . simvastatin  10 mg Oral q1800    PRN Medications: acetaminophen, ALPRAZolam, ondansetron (ZOFRAN) IV, ondansetron, oxyCODONE   Assessment/ Plan:    Pt is a 42 y.o. yo male with a PMHx of renal cancer, poorly controlled HTN, DM, and history of acute systolic and diastolic CHF in 4098 who was admitted on 09/21/2012 for chest pain found to have malignant HTN with BP of 249/137 Renal service is consulted for Cr of 4.16 (from 1.5 in 2010).   1) AKI vs CKD - Cr continuing to trend down. UOP less than the previous day, may need to start home dose of lasix again to remove excess fluid accumulation. Anticipate further recovery in the setting of BP. Will resume lasix  2) HTN/vol - PT still needs control of HTN meds as OP despite peak in BP overnight. Hopeful that rebound HTN following dc of clonidine will resolve soon. In the interim, was given dose of hydralazine. May need to give home dose of lasix for HTN and edema. May need to keep him one more day due to spike in BP overnight. Resume lasix and increase labetalol slightly   3) Anemia - Nulicet for anemia. No need for ESA at this time.  4) Bones - PTH level 204. Looks good for now, can follow as OP.   Dispo- Pt has an appointment to see me on April 21st at 3:00pm as OP to follow his renal function  and his BP. He also was asking for recs on a new PCP, I offered [facility].   Length of Stay: 4 days  Patient history and plan of care reviewed with attending, Dr. Caryl Asp  Amberlea Spagnuolo.   Winnifred Friar, Student-PA  09/25/2012, 9:25 AM  Patient seen and examined, agree with above note with above modifications.  UOP good and creatinine cont to trend for the better.  Regarding BP, will add back lasix that should be his home reg and increase labetalol, hopefully BP will drift down next 24 hours to allow for discharge from hosp tomorrow.   Annie Sable, MD 09/25/2012

## 2012-09-25 NOTE — Progress Notes (Signed)
   SUBJECTIVE:  No chest pain.  No SOB.  PHYSICAL EXAM Filed Vitals:   09/25/12 0836 09/25/12 1050 09/25/12 1055 09/25/12 1400  BP: 185/91 199/81 191/88 133/57  Pulse: 91 93  87  Temp:    98.8 F (37.1 C)  TempSrc:    Oral  Resp:    20  Height:      Weight:      SpO2: 96% 98%  90%   General:  No distress Lungs:  Clear Heart:  RRR, no murmur Abdomen: Positive bowel sounds, no rebound no guarding Extremities:  Mild leg edema.   LABS:  Results for orders placed during the hospital encounter of 09/21/12 (from the past 24 hour(s))  GLUCOSE, CAPILLARY     Status: Abnormal   Collection Time    09/24/12  4:08 PM      Result Value Range   Glucose-Capillary 122 (*) 70 - 99 mg/dL   Comment 1 Notify RN    GLUCOSE, CAPILLARY     Status: Abnormal   Collection Time    09/24/12  9:00 PM      Result Value Range   Glucose-Capillary 161 (*) 70 - 99 mg/dL  BASIC METABOLIC PANEL     Status: Abnormal   Collection Time    09/25/12  6:00 AM      Result Value Range   Sodium 139  135 - 145 mEq/L   Potassium 3.8  3.5 - 5.1 mEq/L   Chloride 101  96 - 112 mEq/L   CO2 26  19 - 32 mEq/L   Glucose, Bld 178 (*) 70 - 99 mg/dL   BUN 38 (*) 6 - 23 mg/dL   Creatinine, Ser 1.61 (*) 0.50 - 1.35 mg/dL   Calcium 9.5  8.4 - 09.6 mg/dL   GFR calc non Af Amer 15 (*) >90 mL/min   GFR calc Af Amer 18 (*) >90 mL/min  GLUCOSE, CAPILLARY     Status: Abnormal   Collection Time    09/25/12  6:43 AM      Result Value Range   Glucose-Capillary 173 (*) 70 - 99 mg/dL  GLUCOSE, CAPILLARY     Status: Abnormal   Collection Time    09/25/12 11:10 AM      Result Value Range   Glucose-Capillary 205 (*) 70 - 99 mg/dL   Comment 1 Notify RN      Intake/Output Summary (Last 24 hours) at 09/25/12 1535 Last data filed at 09/25/12 1400  Gross per 24 hour  Intake    720 ml  Output    425 ml  Net    295 ml    ASSESSMENT AND PLAN:  Hypertensive urgency:    BP is better controlled on current therapy.  Lasix has  been resumed and beta blocker adjusted.  We have talked about salt and fluid restriction.    Chronic systolic congestive heart failure/EF 40% PER echo 2010:  We discussed conservative measures for residual extremity edema.   Elevation of cardiac enzymes:  No further work up.  Demand ischemia.  We will arrange to see him in follow up in about six weeks.  He needs close follow up with nephrology.      Rollene Rotunda 09/25/2012 3:35 PM

## 2012-09-26 LAB — CBC
MCH: 27.9 pg (ref 26.0–34.0)
MCHC: 34.2 g/dL (ref 30.0–36.0)
Platelets: 387 10*3/uL (ref 150–400)

## 2012-09-26 LAB — RENAL FUNCTION PANEL
Albumin: 2.8 g/dL — ABNORMAL LOW (ref 3.5–5.2)
Calcium: 9.3 mg/dL (ref 8.4–10.5)
Creatinine, Ser: 4.44 mg/dL — ABNORMAL HIGH (ref 0.50–1.35)
GFR calc non Af Amer: 15 mL/min — ABNORMAL LOW (ref >90)
Phosphorus: 5.2 mg/dL — ABNORMAL HIGH (ref 2.3–4.6)

## 2012-09-26 MED ORDER — HYDRALAZINE HCL 100 MG PO TABS: 100.0000 mg | ORAL_TABLET | Freq: Two times a day (BID) | ORAL | Status: DC

## 2012-09-26 MED ORDER — FUROSEMIDE 40 MG PO TABS: 40.0000 mg | ORAL_TABLET | Freq: Two times a day (BID) | ORAL | Status: DC

## 2012-09-26 MED ORDER — ASPIRIN 81 MG PO TBEC: 81.0000 mg | DELAYED_RELEASE_TABLET | Freq: Every day | ORAL | Status: DC

## 2012-09-26 MED ORDER — ISOSORBIDE DINITRATE 10 MG PO TABS: 10.0000 mg | ORAL_TABLET | Freq: Two times a day (BID) | ORAL | Status: DC

## 2012-09-26 MED ORDER — CALCIUM CARBONATE ANTACID 500 MG PO CHEW: 2.0000 | CHEWABLE_TABLET | Freq: Three times a day (TID) | ORAL | Status: DC

## 2012-09-26 MED ORDER — LABETALOL HCL 200 MG PO TABS: 400.0000 mg | ORAL_TABLET | Freq: Two times a day (BID) | ORAL | Status: DC

## 2012-09-26 NOTE — Progress Notes (Signed)
[facility] - PROGRESS NOTE Resident Note   Please see below for attending addendum to resident note.  Subjective:   No new complaints.   BP still variable. Kidney function stable with good UOP Objective:    Vital Signs:   Temp:  [97.5 F (36.4 C)-98.8 F (37.1 C)] 97.5 F (36.4 C) (03/29 0455) Pulse Rate:  [87-93] 90 (03/29 0455) Resp:  [18-20] 18 (03/29 0455) BP: (133-199)/(57-100) 188/100 mmHg (03/29 0942) SpO2:  [90 %-98 %] 94 % (03/29 0455) Weight:  [134.401 kg (296 lb 4.8 oz)] 134.401 kg (296 lb 4.8 oz) (03/29 0455) Last BM Date: 08/28/12  24-hour weight change: Weight change: 0.001 kg (0 oz)  Weight trends: Filed Weights   09/24/12 0537 09/25/12 0519 09/26/12 0455  Weight: 136.7 kg (301 lb 5.9 oz) 134.4 kg (296 lb 4.8 oz) 134.401 kg (296 lb 4.8 oz)    Intake/Output:  03/28 0701 - 03/29 0700 In: 820 [P.O.:820] Out: 1603 [Urine:1603]  Physical Exam: General: Vital signs reviewed and noted. Well-developed, well-nourished, in no acute distress; alert, appropriate and cooperative throughout examination.  Lungs:  Normal respiratory effort. Clear to auscultation BL without crackles or wheezes.  Heart: RRR. S1 and S2 normal without gallop, murmur, or rubs.  Abdomen:  BS normoactive. Soft, Nondistended, non-tender.  No masses or organomegaly.  Extremities: 2+  pretibial edema, ceasing at the level of the knee. improved     Labs: Basic Metabolic Panel:  Recent Labs Lab 09/23/12 0415 09/24/12 0502 09/25/12 0600 09/26/12 0500  NA 133* 134* 139 137  K 4.0 4.3 3.8 3.8  CL 98 99 101 99  CO2 26 23 26 23   GLUCOSE 221* 195* 178* 166*  BUN 44* 43* 38* 38*  CREATININE 4.85* 4.63* 4.46* 4.44*  CALCIUM 8.8 8.8 9.5 9.3  PHOS 7.1* 5.5*  --  5.2*    Liver Function Tests:  Recent Labs Lab 09/21/12 09/23/12 0415 09/24/12 0502 09/26/12 0500  AST 17  --   --   --   ALT 17  --   --   --   ALKPHOS 123*  --   --   --   BILITOT 0.3  --   --   --   PROT  7.5  --   --   --   ALBUMIN 3.4* 2.8* 2.9* 2.8*   No results found for this basename: LIPASE, AMYLASE,  in the last 168 hours No results found for this basename: AMMONIA,  in the last 168 hours  CBC:  Recent Labs Lab 09/21/12 09/21/12 2120 09/22/12 0430 09/26/12 0500  WBC 16.1* 14.4* 10.6* 12.5*  NEUTROABS 10.6*  --   --   --   HGB 13.9 13.5 11.3* 11.0*  HCT 39.0 38.1* 33.0* 32.2*  MCV 78.9 81.1 81.3 81.7  PLT 393 390 343 387    Cardiac Enzymes:  Recent Labs Lab 09/22/12 0430 09/22/12 1001 09/22/12 1634  TROPONINI 1.33* 1.49* 0.85*    BNP: No components found with this basename: POCBNP,   CBG:  Recent Labs Lab 09/25/12 0643 09/25/12 1110 09/25/12 1635 09/25/12 2128 09/26/12 0636  GLUCAP 173* 205* 163* 176* 187*    Microbiology: Results for orders placed during the hospital encounter of 09/21/12  MRSA PCR SCREENING     Status: None   Collection Time    09/22/12  3:21 AM      Result Value Range Status   MRSA by PCR NEGATIVE  NEGATIVE Final   Comment:  The GeneXpert MRSA Assay (FDA     approved for NASAL specimens     only), is one component of a     comprehensive MRSA colonization     surveillance program. It is not     intended to diagnose MRSA     infection nor to guide or     monitor treatment for     MRSA infections.    Coagulation Studies: No results found for this basename: LABPROT, INR,  in the last 72 hours  Urinalysis:    Component Value Date/Time   COLORURINE YELLOW 09/22/2012 1805   APPEARANCEUR HAZY* 09/22/2012 1805   LABSPEC 1.022 09/22/2012 1805   PHURINE 5.0 09/22/2012 1805   GLUCOSEU 250* 09/22/2012 1805   HGBUR SMALL* 09/22/2012 1805   BILIRUBINUR NEGATIVE 09/22/2012 1805   KETONESUR NEGATIVE 09/22/2012 1805   PROTEINUR >300* 09/22/2012 1805   UROBILINOGEN 0.2 09/22/2012 1805   NITRITE NEGATIVE 09/22/2012 1805   LEUKOCYTESUR NEGATIVE 09/22/2012 1805     Imaging: No results found.    Medications:    Infusions:     Scheduled Medications: . amLODipine  10 mg Oral Daily  . aspirin EC  81 mg Oral Daily  . calcium carbonate  400 mg of elemental calcium Oral TID WC  . escitalopram  20 mg Oral Daily  . furosemide  40 mg Oral BID  . heparin subcutaneous  5,000 Units Subcutaneous Q8H  . hydrALAZINE  100 mg Oral BID  . insulin aspart  0-15 Units Subcutaneous TID WC  . insulin aspart  0-5 Units Subcutaneous QHS  . insulin glargine  45 Units Subcutaneous Daily  . isosorbide dinitrate  10 mg Oral BID  . labetalol  400 mg Oral BID  . simvastatin  10 mg Oral q1800    PRN Medications: acetaminophen, ALPRAZolam, ondansetron (ZOFRAN) IV, ondansetron, oxyCODONE   Assessment/ Plan:    Pt is a 42 y.o. yo male with a PMHx of renal cancer, poorly controlled HTN, DM, as well as advanced CKD was admitted on 09/21/2012 for  malignant HTN with BP of 249/137  1) AKI vs CKD - Cr stable,  UOP fine. Dont really know what renal baseline is going to be, need more distance from this acute process.   2) HTN/vol - Variable but I think good enough to transition to OP management.  I have given pt prescription for BP cuff so he can monitor BP at home and report it to [place] next week  I would send out on current BP regimen amlod 10/lasix 40 BID/hydralazine 100 BID/labetalol 400 BID  3) Anemia - s/p  Nulicet for anemia. No need for ESA at this time.  4) Bones - PTH level 204. Looks good for now, can follow as OP.   Dispo- Pt has an appointment to see me on April 21st at 3:00pm as OP to follow his renal function and his BP. He also was asking for recs on a new PCP, I offered [facility].  As above I am OK with discharge today, I will follow up on home BP readings and make adjustments as needed and he will have labs soon as well.    Length of Stay: 5 days     Cecille Aver, MD  09/26/2012, 10:19 AM

## 2012-09-26 NOTE — Discharge Summary (Signed)
Physician Discharge Summary  Patient ID: Hayden [name] MRN: [id number] DOB/AGE: [birth year] 41 y.o.  Admit date: 09/21/2012 Discharge date: 09/26/2012  Primary Care Physician:  Pola Corn, MD   Discharge Diagnoses:    Principal Problem:   Hypertensive urgency, malignant Active Problems:   Essential hypertension, malignant   Acute kidney injury   DM2 (diabetes mellitus, type 2)   Chest pain   Acute coronary syndrome   History of partial Right nephrectomy for renal mass (2008)   Left ventricular hypertrophy (moderate) per ECHO 2010   Chronic systolic congestive heart failure/EF 40% PER echo 2010   Elevation of cardiac enzymes      Medication List    STOP taking these medications       cloNIDine 0.2 MG tablet  Commonly known as:  CATAPRES     losartan 100 MG tablet  Commonly known as:  COZAAR     metFORMIN 500 MG tablet  Commonly known as:  GLUCOPHAGE     metoprolol 100 MG tablet  Commonly known as:  LOPRESSOR      TAKE these medications       amLODipine 10 MG tablet  Commonly known as:  NORVASC  Take 10 mg by mouth daily.     aspirin 81 MG EC tablet  Take 1 tablet (81 mg total) by mouth daily.     calcium carbonate 500 MG chewable tablet  Commonly known as:  TUMS - dosed in mg elemental calcium  Chew 2 tablets (400 mg of elemental calcium total) by mouth 3 (three) times daily with meals.     escitalopram 20 MG tablet  Commonly known as:  LEXAPRO  Take 20 mg by mouth daily.     furosemide 40 MG tablet  Commonly known as:  LASIX  Take 1 tablet (40 mg total) by mouth 2 (two) times daily.     hydrALAZINE 100 MG tablet  Commonly known as:  APRESOLINE  Take 1 tablet (100 mg total) by mouth 2 (two) times daily.     insulin glargine 100 UNIT/ML injection  Commonly known as:  LANTUS  Inject 45 Units into the skin daily.     isosorbide dinitrate 10 MG tablet  Commonly known as:  ISORDIL  Take 1 tablet (10 mg total) by mouth 2 (two) times daily.      labetalol 200 MG tablet  Commonly known as:  NORMODYNE  Take 2 tablets (400 mg total) by mouth 2 (two) times daily.     pravastatin 20 MG tablet  Commonly known as:  PRAVACHOL  Take 20 mg by mouth daily.     VITAMIN D (CHOLECALCIFEROL) PO  Take 1 capsule by mouth daily.         Disposition and Follow-up:  Will be discharged home today in stable and improved condition. Has an appointment with Dr. Kathrene Bongo on 4/21 at 3 pm.  Consults:  Renal, Dr. Kathrene Bongo         Cardiology, Dr. Antoine Poche   Significant Diagnostic Studies:  No results found.  Brief H and P: For complete details please refer to admission H and P, but in brief patient is a 42 y.o. male who presented yesterday with c/o chest pain, located in his center chest and epigastric area, 2/10 in severity, associated with left arm cramping. Nothing seems to make it worse, associated with very elevated blood pressure in ED 249/137 chest pain resolved after GI cocktail and L arm cramping resolved after his BP was controlled with nitro  gtt. In the ED patient found to essentially be in hypertensive urgency, blood pressure was able to be controlled with nitro gtt. His chest and arm pain completely resolved although the nitro gtt has given him a headache. Patient was admitted by the hospitalist team but left AGAINST MEDICAL ADVICE.  Patient went home and basically came back with exactly similar complaints. Patient told the ER physician that he wanted to get admitted this time. His wife accompanied him who forced him to come back to the ER. Patient's blood pressure was elevated and his troponins have bumped to 0.77. He was started on a heparin drip and a nitro drip in the ER. Hospitalists team was called to admit the patient.     Hospital Course:  Principal Problem:   Hypertensive urgency, malignant Active Problems:   Essential hypertension, malignant   Acute kidney injury   DM2 (diabetes mellitus, type 2)   Chest pain    Acute coronary syndrome   History of partial Right nephrectomy for renal mass (2008)   Left ventricular hypertrophy (moderate) per ECHO 2010   Chronic systolic congestive heart failure/EF 40% PER echo 2010   Elevation of cardiac enzymes    Malignant HTN with HTN Urgency -Improved. -Has been taken off clonidine due to concerns for rebound HTN given his prior non-compliance. -Will be discharged on: norvasc 10, lasix 40 BID, hydralazine 100 BID, labetalol 400 BID. -BP in the range of 160s upon DC.  ARF -Presumed related to damage from profound HTN. -Unclear if he will recover any function. -Cr is 4.44 on DC.  CP/ACS -Related to malignant HTN. -No further work up is recommended by cards at this point.   Time spent on Discharge: Greater than 30 minutes.  SignedChaya Jan [name] Pager: [phone] 09/26/2012, 4:07 PM

## 2012-11-16 ENCOUNTER — Encounter: Payer: Self-pay | Admitting: Cardiology

## 2012-12-09 ENCOUNTER — Other Ambulatory Visit (HOSPITAL_COMMUNITY): Payer: Self-pay

## 2012-12-10 ENCOUNTER — Encounter (HOSPITAL_COMMUNITY)
Admission: RE | Admit: 2012-12-10 | Discharge: 2012-12-10 | Disposition: A | Discharge status: Home / Self Care | Payer: 59 | Source: Ambulatory Visit | Attending: Nephrology | Admitting: Nephrology

## 2012-12-10 DIAGNOSIS — D649 Anemia, unspecified: Secondary | ICD-10-CM | POA: Insufficient documentation

## 2012-12-10 MED ORDER — SODIUM CHLORIDE 0.9 % IV SOLN: INTRAVENOUS | Status: DC

## 2012-12-10 MED ORDER — SODIUM CHLORIDE 0.9 % IV SOLN
1020.0000 mg | Freq: Once | INTRAVENOUS | Status: AC
  Administered 2012-12-10: 1020 mg via INTRAVENOUS
  Filled 2012-12-10: qty 34

## 2012-12-23 ENCOUNTER — Other Ambulatory Visit: Payer: Self-pay | Admitting: Urology

## 2012-12-23 DIAGNOSIS — C641 Malignant neoplasm of right kidney, except renal pelvis: Secondary | ICD-10-CM

## 2012-12-30 ENCOUNTER — Ambulatory Visit
Admission: RE | Admit: 2012-12-30 | Discharge: 2012-12-30 | Disposition: A | Discharge status: Home / Self Care | Payer: 59 | Source: Ambulatory Visit | Attending: Urology | Admitting: Urology

## 2012-12-30 DIAGNOSIS — C641 Malignant neoplasm of right kidney, except renal pelvis: Secondary | ICD-10-CM

## 2012-12-31 ENCOUNTER — Encounter: Payer: Self-pay | Admitting: Vascular Surgery

## 2012-12-31 ENCOUNTER — Other Ambulatory Visit: Payer: 59

## 2012-12-31 ENCOUNTER — Other Ambulatory Visit: Payer: Self-pay

## 2012-12-31 DIAGNOSIS — Z01810 Encounter for preprocedural cardiovascular examination: Secondary | ICD-10-CM

## 2012-12-31 DIAGNOSIS — N186 End stage renal disease: Secondary | ICD-10-CM

## 2013-01-20 ENCOUNTER — Encounter: Payer: Self-pay | Admitting: Vascular Surgery

## 2013-01-21 ENCOUNTER — Encounter: Payer: Self-pay | Admitting: Vascular Surgery

## 2013-01-21 ENCOUNTER — Encounter (INDEPENDENT_AMBULATORY_CARE_PROVIDER_SITE_OTHER): Payer: 59 | Admitting: *Deleted

## 2013-01-21 ENCOUNTER — Ambulatory Visit (INDEPENDENT_AMBULATORY_CARE_PROVIDER_SITE_OTHER): Payer: 59 | Admitting: Vascular Surgery

## 2013-01-21 ENCOUNTER — Other Ambulatory Visit: Payer: Self-pay

## 2013-01-21 VITALS — BP 149/84 | HR 78 | Resp 16 | Ht 70.0 in | Wt 277.0 lb

## 2013-01-21 DIAGNOSIS — Z01810 Encounter for preprocedural cardiovascular examination: Secondary | ICD-10-CM

## 2013-01-21 DIAGNOSIS — N186 End stage renal disease: Secondary | ICD-10-CM

## 2013-01-21 NOTE — Progress Notes (Signed)
[facility] [place] HISTORY AND PHYSICAL  Referring physician: Kelley Goldsborough History of Present Illness:  Patient is a 42 y.o. year old male who presents for evaluation for placement of a hemodialysis access.  He is right-handed. He is currently not on dialysis. His renal failure is thought to be multifactorial. Other medical problems include hypertension, diabetes, congestive heart failure, obesity, hyperlipidemia. These are all currently controlled. He denies dyspnea on exertion skin itching but does have some occasional nausea  Past Medical History  Diagnosis Date  . History of kidney cancer   . Hypertension   . Diabetes mellitus   . CHF (congestive heart failure) 2010    Acute systolic and diastolic CHF  . Renal disorder   . Obesity   . Hyperlipidemia   . Anemia March 2014  . Cancer     Kidney    Past Surgical History  Procedure Laterality Date  . Nephrectomy Right 2008    partial  . Testicle torsion reduction    . Hernia repair       Social History History  Substance Use Topics  . Smoking status: Current Every Day Smoker  . Smokeless tobacco: Never Used  . Alcohol Use: Yes     Comment: "6 pack occ."    Family History Family History  Problem Relation Age of Onset  . Heart disease Mother     Heart Disease before age 60  . Deep vein thrombosis Father   . Heart attack Father     Allergies  Allergies  Allergen Reactions  . Dilaudid (Hydromorphone Hcl) Other (See Comments)    Abnormal behavior  . Morphine And Related Itching     Current Outpatient Prescriptions  Medication Sig Dispense Refill  . amLODipine (NORVASC) 10 MG tablet Take 10 mg by mouth daily.      . aspirin EC 81 MG EC tablet Take 1 tablet (81 mg total) by mouth daily.      . buPROPion (WELLBUTRIN SR) 150 MG 12 hr tablet Take 150 mg by mouth 2 (two) times daily.      . calcitRIOL (ROCALTROL) 0.25 MCG capsule Take 0.25 mcg by mouth daily.      . calcium carbonate  (TUMS - DOSED IN MG ELEMENTAL CALCIUM) 500 MG chewable tablet Chew 2 tablets (400 mg of elemental calcium total) by mouth 3 (three) times daily with meals.      . cloNIDine (CATAPRES - DOSED IN MG/24 HR) 0.2 mg/24hr patch Place 1 patch onto the skin once a week.      . escitalopram (LEXAPRO) 20 MG tablet Take 20 mg by mouth daily.      . furosemide (LASIX) 40 MG tablet Take 1 tablet (40 mg total) by mouth 2 (two) times daily.  60 tablet  1  . furosemide (LASIX) 80 MG tablet Take 80 mg by mouth.      . hydrALAZINE (APRESOLINE) 100 MG tablet Take 1 tablet (100 mg total) by mouth 2 (two) times daily.  60 tablet  1  . insulin glargine (LANTUS) 100 UNIT/ML injection Inject 45 Units into the skin daily.       . isosorbide dinitrate (ISORDIL) 10 MG tablet Take 1 tablet (10 mg total) by mouth 2 (two) times daily.  60 tablet  1  . labetalol (NORMODYNE) 200 MG tablet Take 2 tablets (400 mg total) by mouth 2 (two) times daily.  120 tablet  1  . pravastatin (PRAVACHOL) 20 MG tablet Take 20 mg by mouth   daily.      . VITAMIN D, CHOLECALCIFEROL, PO Take 1 capsule by mouth daily.        No current facility-administered medications for this visit.    ROS:   General:  No weight loss, Fever, chills  HEENT: No recent headaches, no nasal bleeding, no visual changes, no sore throat  Neurologic: No dizziness, blackouts, seizures. No recent symptoms of stroke or mini- stroke. No recent episodes of slurred speech, or temporary blindness.  Cardiac: No recent episodes of chest pain/pressure, no shortness of breath at rest.  No shortness of breath with exertion.  Denies history of atrial fibrillation or irregular heartbeat  Vascular: No history of rest pain in feet.  No history of claudication.  No history of non-healing ulcer, No history of DVT   Pulmonary: No home oxygen, no productive cough, no hemoptysis,  No asthma or wheezing  Musculoskeletal:  [ ] Arthritis, [ ] Low back pain,  [ ] Joint  pain  Hematologic:No history of hypercoagulable state.  No history of easy bleeding.  No history of anemia  Gastrointestinal: No hematochezia or melena,  No gastroesophageal reflux, no trouble swallowing  Urinary: [x ] chronic Kidney disease, [ ] on HD - [ ] MWF or [ ] TTHS, [ ] Burning with urination, [ ] Frequent urination, [ ] Difficulty urinating;   Skin: No rashes  Psychological: No history of anxiety,  No history of depression   Physical Examination  Filed Vitals:   01/21/13 1518  BP: 149/84  Pulse: 78  Resp: 16  Height: 5' 10" (1.778 m)  Weight: 277 lb (125.646 kg)  SpO2: 95%    Body mass index is 39.75 kg/(m^2).  General:  Alert and oriented, no acute distress HEENT: Normal Neck: No bruit or JVD Pulmonary: Clear to auscultation bilaterally Cardiac: Regular Rate and Rhythm without murmur Abdomen: Soft, non-tender, non-distended, no mass, obese Skin: No rash Extremity Pulses:  2+ radial, brachial  pulses bilaterally, and placement of a tourniquet in the left upper extremity the cephalic vein is palpable easily from the wrist to the mid forearm Musculoskeletal: No deformity or edema  Neurologic: Upper and lower extremity motor 5/5 and symmetric  DATA: Patient had a vein mapping ultrasound the upper extremity is performed today. Left cephalic vein was 4-6 mm in diameter right was free and a half to 7 mm in diameter basilic vein was 4-6 mm in diameter bilaterally   ASSESSMENT: Needs hemodialysis access   PLAN:  Left radiocephalic AV fistula, by my partner Dr. Dickson on 01/26/2013. Risks benefits possible complications procedure details including non-maturation of fistula ischemic steal bleeding infection were explained the patient today. He understands and agrees to proceed.  Summerlynn Glauser, MD [facility] [place] Office: [phone] Pager: [phone]  

## 2013-01-22 ENCOUNTER — Encounter (HOSPITAL_COMMUNITY): Payer: Self-pay | Admitting: Pharmacy Technician

## 2013-01-22 NOTE — Pre-Procedure Instructions (Signed)
Hayden [name]  01/22/2013   Your procedure is scheduled on: Tuesday, July 29th    Report to [facility] at  5:30 AM.  Call this number if you have problems the morning of surgery: [phone]   Remember:   Do not eat food or drink liquids after midnight Monday.   Take these medicines the morning of surgery with A SIP OF WATER:  Norvasc, Isosorbide, Labetalol   Do not wear jewelry.  Do not wear lotions, powders, or colognes. You may NOT wear deodorant.             Men may shave face and neck.  Do not bring valuables to the hospital.  [facility] is not responsible for any belongings or valuables.  Contacts, dentures or bridgework may not be worn into surgery.   Leave suitcase in the car. After surgery it may be brought to your room.  For patients admitted to the hospital, checkout time is 11:00 AM the day of discharge.   Patients discharged the day of surgery will not be allowed to drive home.   Name and phone number of your driver:    Special Instructions: Shower using CHG 2 nights before surgery and the night before surgery.  If you shower the day of surgery use CHG.  Use special wash - you have one bottle of CHG for all showers.  You should use approximately 1/3 of the bottle for each shower.   Please read over the following fact sheets that you were given: Pain Booklet and Surgical Site Infection Prevention

## 2013-01-25 ENCOUNTER — Encounter (HOSPITAL_COMMUNITY): Payer: Self-pay

## 2013-01-25 ENCOUNTER — Encounter (HOSPITAL_COMMUNITY)
Admission: RE | Admit: 2013-01-25 | Discharge: 2013-01-25 | Disposition: A | Discharge status: Home / Self Care | Payer: 59 | Source: Ambulatory Visit | Attending: Vascular Surgery | Admitting: Vascular Surgery

## 2013-01-25 MED ORDER — DEXTROSE 5 % IV SOLN
1.5000 g | INTRAVENOUS | Status: AC
  Administered 2013-01-26: 1.5 g via INTRAVENOUS
  Filled 2013-01-25 (×2): qty 1.5

## 2013-01-25 NOTE — Progress Notes (Addendum)
This patient has tested for an increase risk for Obstructive Sleep Apnea per the Stop Calvert Tool done in Pre-Admission.  Anything greater the 4 is at increased risk.  Thanks   D. Marylen Ponto  Have sent a requested for any cardiac tests and last office visit note from Dr. Shana Chute. At Pre-Admit testing, i rechecked his blood pressure--(r) 207/104    (l) 203/107   Pt DID NOT take any blood pressure meds this am.......(he takes Norvasc, Labetalol, clonidine patch)

## 2013-01-25 NOTE — Progress Notes (Signed)
Spoke with patient's wife about surgery time change and told her that pt needed to be here at 6:30am instead of 5:30 am.  She voiced understanding,

## 2013-01-26 ENCOUNTER — Ambulatory Visit (HOSPITAL_COMMUNITY): Payer: 59 | Admitting: Anesthesiology

## 2013-01-26 ENCOUNTER — Encounter (HOSPITAL_COMMUNITY): Payer: Self-pay | Admitting: *Deleted

## 2013-01-26 ENCOUNTER — Encounter (HOSPITAL_COMMUNITY): Payer: Self-pay | Admitting: Anesthesiology

## 2013-01-26 ENCOUNTER — Encounter (HOSPITAL_COMMUNITY)
Admission: RE | Disposition: A | Discharge status: Home / Self Care | Payer: Self-pay | Source: Ambulatory Visit | Attending: Vascular Surgery

## 2013-01-26 ENCOUNTER — Ambulatory Visit (HOSPITAL_COMMUNITY)
Admission: RE | Admit: 2013-01-26 | Discharge: 2013-01-26 | Disposition: A | Discharge status: Home / Self Care | Payer: 59 | Source: Ambulatory Visit | Attending: Vascular Surgery | Admitting: Vascular Surgery

## 2013-01-26 DIAGNOSIS — Z885 Allergy status to narcotic agent status: Secondary | ICD-10-CM | POA: Insufficient documentation

## 2013-01-26 DIAGNOSIS — Z8249 Family history of ischemic heart disease and other diseases of the circulatory system: Secondary | ICD-10-CM | POA: Insufficient documentation

## 2013-01-26 DIAGNOSIS — E119 Type 2 diabetes mellitus without complications: Secondary | ICD-10-CM | POA: Insufficient documentation

## 2013-01-26 DIAGNOSIS — N186 End stage renal disease: Secondary | ICD-10-CM | POA: Insufficient documentation

## 2013-01-26 DIAGNOSIS — Z7982 Long term (current) use of aspirin: Secondary | ICD-10-CM | POA: Insufficient documentation

## 2013-01-26 DIAGNOSIS — D649 Anemia, unspecified: Secondary | ICD-10-CM | POA: Insufficient documentation

## 2013-01-26 DIAGNOSIS — Z905 Acquired absence of kidney: Secondary | ICD-10-CM | POA: Insufficient documentation

## 2013-01-26 DIAGNOSIS — Z6839 Body mass index (BMI) 39.0-39.9, adult: Secondary | ICD-10-CM | POA: Insufficient documentation

## 2013-01-26 DIAGNOSIS — I509 Heart failure, unspecified: Secondary | ICD-10-CM | POA: Insufficient documentation

## 2013-01-26 DIAGNOSIS — Z79899 Other long term (current) drug therapy: Secondary | ICD-10-CM | POA: Insufficient documentation

## 2013-01-26 DIAGNOSIS — E785 Hyperlipidemia, unspecified: Secondary | ICD-10-CM | POA: Insufficient documentation

## 2013-01-26 DIAGNOSIS — I120 Hypertensive chronic kidney disease with stage 5 chronic kidney disease or end stage renal disease: Secondary | ICD-10-CM | POA: Insufficient documentation

## 2013-01-26 DIAGNOSIS — Z794 Long term (current) use of insulin: Secondary | ICD-10-CM | POA: Insufficient documentation

## 2013-01-26 DIAGNOSIS — Z85528 Personal history of other malignant neoplasm of kidney: Secondary | ICD-10-CM | POA: Insufficient documentation

## 2013-01-26 HISTORY — PX: AV FISTULA PLACEMENT: SHX1204

## 2013-01-26 LAB — POCT I-STAT 4, (NA,K, GLUC, HGB,HCT)
Glucose, Bld: 157 mg/dL — ABNORMAL HIGH (ref 70–99)
HCT: 40 % (ref 39.0–52.0)
Hemoglobin: 13.6 g/dL (ref 13.0–17.0)
Potassium: 3.5 mEq/L (ref 3.5–5.1)
Sodium: 141 mEq/L (ref 135–145)

## 2013-01-26 SURGERY — ARTERIOVENOUS (AV) FISTULA CREATION
Anesthesia: Monitor Anesthesia Care | Site: Arm Lower | Laterality: Left | Wound class: Clean

## 2013-01-26 MED ORDER — OXYCODONE-ACETAMINOPHEN 5-325 MG PO TABS: 1.0000 | ORAL_TABLET | ORAL | Status: DC | PRN

## 2013-01-26 MED ORDER — OXYCODONE-ACETAMINOPHEN 5-325 MG PO TABS
ORAL_TABLET | ORAL | Status: AC
  Filled 2013-01-26: qty 2

## 2013-01-26 MED ORDER — SODIUM CHLORIDE 0.9 % IR SOLN
Status: DC | PRN
  Administered 2013-01-26: 09:00:00

## 2013-01-26 MED ORDER — HEPARIN SODIUM (PORCINE) 1000 UNIT/ML IJ SOLN
INTRAMUSCULAR | Status: DC | PRN
  Administered 2013-01-26: 8000 [IU] via INTRAVENOUS

## 2013-01-26 MED ORDER — ONDANSETRON HCL 4 MG/2ML IJ SOLN
INTRAMUSCULAR | Status: DC | PRN
  Administered 2013-01-26: 4 mg via INTRAVENOUS

## 2013-01-26 MED ORDER — LIDOCAINE-EPINEPHRINE (PF) 1 %-1:200000 IJ SOLN
INTRAMUSCULAR | Status: AC
  Filled 2013-01-26: qty 10

## 2013-01-26 MED ORDER — PROPOFOL INFUSION 10 MG/ML OPTIME
INTRAVENOUS | Status: DC | PRN
  Administered 2013-01-26: 160 ug/kg/min via INTRAVENOUS

## 2013-01-26 MED ORDER — THROMBIN 20000 UNITS EX SOLR
CUTANEOUS | Status: AC
  Filled 2013-01-26: qty 20000

## 2013-01-26 MED ORDER — FENTANYL CITRATE 0.05 MG/ML IJ SOLN
INTRAMUSCULAR | Status: DC | PRN
  Administered 2013-01-26: 100 ug via INTRAVENOUS

## 2013-01-26 MED ORDER — OXYCODONE-ACETAMINOPHEN 5-325 MG PO TABS
2.0000 | ORAL_TABLET | Freq: Once | ORAL | Status: AC
  Administered 2013-01-26: 2 via ORAL

## 2013-01-26 MED ORDER — SODIUM CHLORIDE 0.9 % IV SOLN
INTRAVENOUS | Status: DC
  Administered 2013-01-26: 07:00:00 via INTRAVENOUS

## 2013-01-26 MED ORDER — 0.9 % SODIUM CHLORIDE (POUR BTL) OPTIME
TOPICAL | Status: DC | PRN
  Administered 2013-01-26: 1000 mL

## 2013-01-26 MED ORDER — PROTAMINE SULFATE 10 MG/ML IV SOLN
INTRAVENOUS | Status: DC | PRN
  Administered 2013-01-26: 20 mg via INTRAVENOUS
  Administered 2013-01-26: 10 mg via INTRAVENOUS
  Administered 2013-01-26: 20 mg via INTRAVENOUS
  Administered 2013-01-26: 10 mg via INTRAVENOUS

## 2013-01-26 MED ORDER — LIDOCAINE-EPINEPHRINE (PF) 1 %-1:200000 IJ SOLN
INTRAMUSCULAR | Status: DC | PRN
  Administered 2013-01-26: 30 mL

## 2013-01-26 MED ORDER — MIDAZOLAM HCL 5 MG/5ML IJ SOLN
INTRAMUSCULAR | Status: DC | PRN
  Administered 2013-01-26: 2 mg via INTRAVENOUS

## 2013-01-26 SURGICAL SUPPLY — 38 items
ADH SKN CLS APL DERMABOND .7 (GAUZE/BANDAGES/DRESSINGS) ×1
ARMBAND PINK RESTRICT EXTREMIT (MISCELLANEOUS) ×4 IMPLANT
CANISTER SUCTION 2500CC (MISCELLANEOUS) ×2 IMPLANT
CLIP TI MEDIUM 6 (CLIP) ×2 IMPLANT
CLIP TI WIDE RED SMALL 6 (CLIP) ×4 IMPLANT
CLOTH BEACON ORANGE TIMEOUT ST (SAFETY) ×2 IMPLANT
COVER PROBE W GEL 5X96 (DRAPES) IMPLANT
COVER SURGICAL LIGHT HANDLE (MISCELLANEOUS) ×2 IMPLANT
DECANTER SPIKE VIAL GLASS SM (MISCELLANEOUS) ×2 IMPLANT
DERMABOND ADVANCED (GAUZE/BANDAGES/DRESSINGS) ×1
DERMABOND ADVANCED .7 DNX12 (GAUZE/BANDAGES/DRESSINGS) ×1 IMPLANT
DRAIN PENROSE 1/2X12 LTX STRL (WOUND CARE) IMPLANT
ELECT REM PT RETURN 9FT ADLT (ELECTROSURGICAL) ×2
ELECTRODE REM PT RTRN 9FT ADLT (ELECTROSURGICAL) ×1 IMPLANT
GLOVE BIO SURGEON STRL SZ7.5 (GLOVE) ×4 IMPLANT
GLOVE BIOGEL PI IND STRL 6.5 (GLOVE) IMPLANT
GLOVE BIOGEL PI IND STRL 8 (GLOVE) ×1 IMPLANT
GLOVE BIOGEL PI INDICATOR 6.5 (GLOVE) ×2
GLOVE BIOGEL PI INDICATOR 8 (GLOVE) ×3
GLOVE ECLIPSE 6.5 STRL STRAW (GLOVE) ×1 IMPLANT
GOWN STRL NON-REIN LRG LVL3 (GOWN DISPOSABLE) ×3 IMPLANT
KIT BASIN OR (CUSTOM PROCEDURE TRAY) ×2 IMPLANT
KIT ROOM TURNOVER OR (KITS) ×2 IMPLANT
NDL HYPO 25GX1X1/2 BEV (NEEDLE) ×1 IMPLANT
NEEDLE HYPO 25GX1X1/2 BEV (NEEDLE) ×2 IMPLANT
NS IRRIG 1000ML POUR BTL (IV SOLUTION) ×2 IMPLANT
PACK CV ACCESS (CUSTOM PROCEDURE TRAY) ×2 IMPLANT
PAD ARMBOARD 7.5X6 YLW CONV (MISCELLANEOUS) ×4 IMPLANT
SPONGE GAUZE 4X4 12PLY (GAUZE/BANDAGES/DRESSINGS) ×2 IMPLANT
SPONGE SURGIFOAM ABS GEL 100 (HEMOSTASIS) IMPLANT
SUT PROLENE 6 0 BV (SUTURE) ×2 IMPLANT
SUT VIC AB 3-0 SH 27 (SUTURE) ×4
SUT VIC AB 3-0 SH 27X BRD (SUTURE) ×1 IMPLANT
SUT VICRYL 4-0 PS2 18IN ABS (SUTURE) ×3 IMPLANT
TOWEL OR 17X24 6PK STRL BLUE (TOWEL DISPOSABLE) ×2 IMPLANT
TOWEL OR 17X26 10 PK STRL BLUE (TOWEL DISPOSABLE) ×2 IMPLANT
UNDERPAD 30X30 INCONTINENT (UNDERPADS AND DIAPERS) ×2 IMPLANT
WATER STERILE IRR 1000ML POUR (IV SOLUTION) ×2 IMPLANT

## 2013-01-26 NOTE — Preoperative (Signed)
Beta Blockers   Reason not to administer Beta Blockers:Not Applicable 

## 2013-01-26 NOTE — Anesthesia Procedure Notes (Signed)
Procedure Name: MAC Date/Time: 01/26/2013 8:50 AM Performed by: Jerilee Hoh Pre-anesthesia Checklist: Patient identified, Emergency Drugs available, Suction available and Patient being monitored Patient Re-evaluated:Patient Re-evaluated prior to inductionOxygen Delivery Method: Simple face mask Intubation Type: IV induction Placement Confirmation: positive ETCO2 and breath sounds checked- equal and bilateral

## 2013-01-26 NOTE — Interval H&P Note (Signed)
History and Physical Interval Note:  01/26/2013 8:16 AM  Hayden [name]  has presented today for surgery, with the diagnosis of End Stage Renal Disease  The various methods of treatment have been discussed with the patient and family. After consideration of risks, benefits and other options for treatment, the patient has consented to  Procedure(s): ARTERIOVENOUS (AV) FISTULA CREATION - LEFT RADIAL CEPHALIC AVF (Left) as a surgical intervention .  The patient's history has been reviewed, patient examined, no change in status, stable for surgery.  I have reviewed the patient's chart and labs.  Questions were answered to the patient's satisfaction.     Izell Labat S

## 2013-01-26 NOTE — H&P (View-Only) (Signed)
[facility] [place] HISTORY AND PHYSICAL  Referring physician: Ballard Russell History of Present Illness:  Patient is a 42 y.o. year old male who presents for evaluation for placement of a hemodialysis access.  He is right-handed. He is currently not on dialysis. His renal failure is thought to be multifactorial. Other medical problems include hypertension, diabetes, congestive heart failure, obesity, hyperlipidemia. These are all currently controlled. He denies dyspnea on exertion skin itching but does have some occasional nausea  Past Medical History  Diagnosis Date  . History of kidney cancer   . Hypertension   . Diabetes mellitus   . CHF (congestive heart failure) 2010    Acute systolic and diastolic CHF  . Renal disorder   . Obesity   . Hyperlipidemia   . Anemia March 2014  . Cancer     Kidney    Past Surgical History  Procedure Laterality Date  . Nephrectomy Right 2008    partial  . Testicle torsion reduction    . Hernia repair       Social History History  Substance Use Topics  . Smoking status: Current Every Day Smoker  . Smokeless tobacco: Never Used  . Alcohol Use: Yes     Comment: "6 pack occ."    Family History Family History  Problem Relation Age of Onset  . Heart disease Mother     Heart Disease before age 72  . Deep vein thrombosis Father   . Heart attack Father     Allergies  Allergies  Allergen Reactions  . Dilaudid (Hydromorphone Hcl) Other (See Comments)    Abnormal behavior  . Morphine And Related Itching     Current Outpatient Prescriptions  Medication Sig Dispense Refill  . amLODipine (NORVASC) 10 MG tablet Take 10 mg by mouth daily.      Marland Kitchen aspirin EC 81 MG EC tablet Take 1 tablet (81 mg total) by mouth daily.      Marland Kitchen buPROPion (WELLBUTRIN SR) 150 MG 12 hr tablet Take 150 mg by mouth 2 (two) times daily.      . calcitRIOL (ROCALTROL) 0.25 MCG capsule Take 0.25 mcg by mouth daily.      . calcium carbonate  (TUMS - DOSED IN MG ELEMENTAL CALCIUM) 500 MG chewable tablet Chew 2 tablets (400 mg of elemental calcium total) by mouth 3 (three) times daily with meals.      . cloNIDine (CATAPRES - DOSED IN MG/24 HR) 0.2 mg/24hr patch Place 1 patch onto the skin once a week.      . escitalopram (LEXAPRO) 20 MG tablet Take 20 mg by mouth daily.      . furosemide (LASIX) 40 MG tablet Take 1 tablet (40 mg total) by mouth 2 (two) times daily.  60 tablet  1  . furosemide (LASIX) 80 MG tablet Take 80 mg by mouth.      . hydrALAZINE (APRESOLINE) 100 MG tablet Take 1 tablet (100 mg total) by mouth 2 (two) times daily.  60 tablet  1  . insulin glargine (LANTUS) 100 UNIT/ML injection Inject 45 Units into the skin daily.       . isosorbide dinitrate (ISORDIL) 10 MG tablet Take 1 tablet (10 mg total) by mouth 2 (two) times daily.  60 tablet  1  . labetalol (NORMODYNE) 200 MG tablet Take 2 tablets (400 mg total) by mouth 2 (two) times daily.  120 tablet  1  . pravastatin (PRAVACHOL) 20 MG tablet Take 20 mg by mouth  daily.      Marland Kitchen VITAMIN D, CHOLECALCIFEROL, PO Take 1 capsule by mouth daily.        No current facility-administered medications for this visit.    ROS:   General:  No weight loss, Fever, chills  HEENT: No recent headaches, no nasal bleeding, no visual changes, no sore throat  Neurologic: No dizziness, blackouts, seizures. No recent symptoms of stroke or mini- stroke. No recent episodes of slurred speech, or temporary blindness.  Cardiac: No recent episodes of chest pain/pressure, no shortness of breath at rest.  No shortness of breath with exertion.  Denies history of atrial fibrillation or irregular heartbeat  Vascular: No history of rest pain in feet.  No history of claudication.  No history of non-healing ulcer, No history of DVT   Pulmonary: No home oxygen, no productive cough, no hemoptysis,  No asthma or wheezing  Musculoskeletal:  [ ]  Arthritis, [ ]  Low back pain,  [ ]  Joint  pain  Hematologic:No history of hypercoagulable state.  No history of easy bleeding.  No history of anemia  Gastrointestinal: No hematochezia or melena,  No gastroesophageal reflux, no trouble swallowing  Urinary: [x ] chronic Kidney disease, [ ]  on HD - [ ]  MWF or [ ]  TTHS, [ ]  Burning with urination, [ ]  Frequent urination, [ ]  Difficulty urinating;   Skin: No rashes  Psychological: No history of anxiety,  No history of depression   Physical Examination  Filed Vitals:   01/21/13 1518  BP: 149/84  Pulse: 78  Resp: 16  Height: 5\' 10"  (1.778 m)  Weight: 277 lb (125.646 kg)  SpO2: 95%    Body mass index is 39.75 kg/(m^2).  General:  Alert and oriented, no acute distress HEENT: Normal Neck: No bruit or JVD Pulmonary: Clear to auscultation bilaterally Cardiac: Regular Rate and Rhythm without murmur Abdomen: Soft, non-tender, non-distended, no mass, obese Skin: No rash Extremity Pulses:  2+ radial, brachial  pulses bilaterally, and placement of a tourniquet in the left upper extremity the cephalic vein is palpable easily from the wrist to the mid forearm Musculoskeletal: No deformity or edema  Neurologic: Upper and lower extremity motor 5/5 and symmetric  DATA: Patient had a vein mapping ultrasound the upper extremity is performed today. Left cephalic vein was 4-6 mm in diameter right was free and a half to 7 mm in diameter basilic vein was 4-6 mm in diameter bilaterally   ASSESSMENT: Needs hemodialysis access   PLAN:  Left radiocephalic AV fistula, by my partner Dr. Edilia Bo on 01/26/2013. Risks benefits possible complications procedure details including non-maturation of fistula ischemic steal bleeding infection were explained the patient today. He understands and agrees to proceed.  Fabienne Bruns, MD [facility] [place] Office: [phone] Pager: [phone]

## 2013-01-26 NOTE — Transfer of Care (Signed)
Immediate Anesthesia Transfer of Care Note  Patient: Hayden [name]  Procedure(s) Performed: Procedure(s): ARTERIOVENOUS (AV) FISTULA CREATION - LEFT RADIAL CEPHALIC AVF (Left)  Patient Location: PACU  Anesthesia Type:MAC  Level of Consciousness: awake, alert , oriented and patient cooperative  Airway & Oxygen Therapy: Patient Spontanous Breathing and Patient connected to face mask oxygen  Post-op Assessment: Report given to PACU RN, Post -op Vital signs reviewed and stable and Patient moving all extremities  Post vital signs: Reviewed and stable  Complications: No apparent anesthesia complications

## 2013-01-26 NOTE — Anesthesia Preprocedure Evaluation (Signed)
Anesthesia Evaluation  Patient identified by MRN, date of birth, ID band Patient awake    History of Anesthesia Complications (+) PONV  Airway Mallampati: II  Neck ROM: Full    Dental   Pulmonary          Cardiovascular hypertension, +CHF Rate:Normal     Neuro/Psych    GI/Hepatic   Endo/Other  diabetesMorbid obesity  Renal/GU ESRF and DialysisRenal disease     Musculoskeletal   Abdominal (+) + obese,   Peds  Hematology   Anesthesia Other Findings   Reproductive/Obstetrics                           Anesthesia Physical Anesthesia Plan  ASA: III  Anesthesia Plan: MAC   Post-op Pain Management:    Induction: Intravenous  Airway Management Planned: Natural Airway  Additional Equipment:   Intra-op Plan:   Post-operative Plan:   Informed Consent: I have reviewed the patients History and Physical, chart, labs and discussed the procedure including the risks, benefits and alternatives for the proposed anesthesia with the patient or authorized representative who has indicated his/her understanding and acceptance.   Dental advisory given  Plan Discussed with: CRNA and Surgeon  Anesthesia Plan Comments:         Anesthesia Quick Evaluation

## 2013-01-26 NOTE — Anesthesia Postprocedure Evaluation (Signed)
  Anesthesia Post-op Note  Patient: Hayden [name]  Procedure(s) Performed: Procedure(s): ARTERIOVENOUS (AV) FISTULA CREATION - LEFT RADIAL CEPHALIC AVF (Left)  Patient Location: PACU  Anesthesia Type:MAC  Level of Consciousness: awake  Airway and Oxygen Therapy: Patient Spontanous Breathing  Post-op Pain: mild  Post-op Assessment: Post-op Vital signs reviewed  Post-op Vital Signs: stable  Complications: No apparent anesthesia complications

## 2013-01-26 NOTE — Op Note (Signed)
NAME: Hayden [name]   MRN: [id number] DOB: Jul 29, 1970    DATE OF OPERATION: 01/26/2013  PREOP DIAGNOSIS: chronic kidney disease  POSTOP DIAGNOSIS: same  PROCEDURE: left radial cephalic AV fistula  SURGEON: Di Kindle. Edilia Bo, MD, FACS  ASSIST: Trudie Buckler RNFA  ANESTHESIA: local with sedation   EBL: minimal  INDICATIONS: [name] is a 42 y.o. male who is not yet on dialysis. He presents for elective AV fistula.  FINDINGS: 2.5 mm artery. 3-1/2 mm vein.  TECHNIQUE: The patient was taken to the operating room and sedated by anesthesia. The left upper extremity was prepped and draped in the usual sterile fashion. After the skin was anesthetized with 1% lidocaine, an incision was made at the left wrist where the cephalic vein was dissected free and ligated distally. Branches were divided between clips and 3-0 silk ties. The vein irrigated out with heparinized saline and was approximately 3.5 mm vein. The radial artery was dissected free beneath the fascia. Somewhat small but had a good pulse. The patient was heparinized. The vein was spatulated. Radial artery was clamped proximally and distally and a longitudinal arteriotomy was made. The vein was sewn end-to-side to the artery using continuous 6-0 Prolene suture. At the completion was an excellent thrill in the fistula. Hemostasis was obtained in the wound and the heparin was partially reversed with protamine. The wounds closed the deep layer 3-0 Vicryl the skin closed with 4-0 Vicryl. Dermabond was applied. Patient tolerated procedure well and was transferred to the recovery room in stable condition. All needle and sponge counts were correct.  Waverly Ferrari, MD, FACS [facility]  DATE OF DICTATION:   01/26/2013

## 2013-01-27 ENCOUNTER — Telehealth: Payer: Self-pay | Admitting: Vascular Surgery

## 2013-01-27 NOTE — Telephone Encounter (Addendum)
Message copied by Fredrich Birks on Wed Jan 27, 2013  9:08 AM ------      Message from: Melene Plan      Created: Tue Jan 26, 2013 12:04 PM      Regarding: FW: charge and f/u                   ----- Message -----         From: Chuck Hint, MD         Sent: 01/26/2013  10:38 AM           To: Reuel Derby, Melene Plan, RN, #      Subject: charge and f/u                                           PROCEDURE: left radial cephalic AV fistula            SURGEON: Di Kindle. Edilia Bo, MD, FACS            ASSIST: Trudie Buckler RNFA            He will need a follow up visit in 6 weeks to check on the maturation of this fistula. Thank you. CD ------   Left message on pts home #, dpm

## 2013-01-29 ENCOUNTER — Encounter (HOSPITAL_COMMUNITY): Payer: Self-pay | Admitting: Vascular Surgery

## 2013-02-15 ENCOUNTER — Encounter: Payer: Self-pay | Admitting: Nephrology

## 2013-02-16 ENCOUNTER — Encounter: Payer: Self-pay | Admitting: Nephrology

## 2013-02-26 ENCOUNTER — Other Ambulatory Visit: Payer: Self-pay | Admitting: *Deleted

## 2013-02-26 DIAGNOSIS — N186 End stage renal disease: Secondary | ICD-10-CM

## 2013-02-26 DIAGNOSIS — Z4931 Encounter for adequacy testing for hemodialysis: Secondary | ICD-10-CM

## 2013-03-09 ENCOUNTER — Encounter: Payer: Self-pay | Admitting: Vascular Surgery

## 2013-03-10 ENCOUNTER — Encounter: Payer: 59 | Admitting: Vascular Surgery

## 2013-05-06 ENCOUNTER — Other Ambulatory Visit: Payer: Self-pay

## 2013-10-12 ENCOUNTER — Encounter: Payer: 59 | Admitting: Nurse Practitioner

## 2013-10-12 ENCOUNTER — Ambulatory Visit (INDEPENDENT_AMBULATORY_CARE_PROVIDER_SITE_OTHER): Payer: 59 | Admitting: Cardiology

## 2013-10-12 ENCOUNTER — Encounter: Payer: Self-pay | Admitting: Cardiology

## 2013-10-12 VITALS — BP 147/78 | HR 89 | Ht 70.0 in | Wt 244.0 lb

## 2013-10-12 DIAGNOSIS — I509 Heart failure, unspecified: Secondary | ICD-10-CM

## 2013-10-12 DIAGNOSIS — I5022 Chronic systolic (congestive) heart failure: Secondary | ICD-10-CM

## 2013-10-12 DIAGNOSIS — Z139 Encounter for screening, unspecified: Secondary | ICD-10-CM

## 2013-10-12 NOTE — Patient Instructions (Signed)
Your physician has requested that you have an echocardiogram. Echocardiography is a painless test that uses sound waves to create images of your heart. It provides your doctor with information about the size and shape of your heart and how well your heart's chambers and valves are working. This procedure takes approximately one hour. There are no restrictions for this procedure.  Your physician recommends that you continue on your current medications as directed. Please refer to the Current Medication list given to you today.  Your follow up will be based on your results.

## 2013-10-12 NOTE — Progress Notes (Signed)
Clinical Summary [name] is a 43 y.o.male seen today as a new patient.   1. Systolic dysfunction - previous admit 08/2012 with HTN emergency and chest pain. Chest pain resolved with GI cocktail. Mild trop of 0.77 thought to be demand ischemia. Echo 2010 reportedly LVEF 40%.  - denies any recent chest pain, no SOB, no DOE.    2. ESRD - compliant with HD MWF - he is currently undergoing evaluation for possible kidney transplant     Past Medical History  Diagnosis Date  . History of kidney cancer   . Hypertension   . Diabetes mellitus   . CHF (congestive heart failure) 1610    Acute systolic and diastolic CHF  . Renal disorder   . Obesity   . Hyperlipidemia   . Anemia March 2014  . Cancer     Kidney  . PONV (postoperative nausea and vomiting)   . Depression     & rage --  was in counseling....great now     Allergies  Allergen Reactions  . Dilaudid [Hydromorphone Hcl] Other (See Comments)    Abnormal behavior  . Morphine And Related Itching     Current Outpatient Prescriptions  Medication Sig Dispense Refill  . amLODipine (NORVASC) 10 MG tablet Take 10 mg by mouth daily.      Marland Kitchen aspirin EC 81 MG tablet Take 81 mg by mouth daily.      Marland Kitchen buPROPion (WELLBUTRIN SR) 150 MG 12 hr tablet Take 150 mg by mouth 2 (two) times daily.      . calcitRIOL (ROCALTROL) 0.25 MCG capsule Take 0.25 mcg by mouth daily.      . calcium carbonate (TUMS - DOSED IN MG ELEMENTAL CALCIUM) 500 MG chewable tablet Chew 2 tablets by mouth 3 (three) times daily.      . cloNIDine (CATAPRES - DOSED IN MG/24 HR) 0.2 mg/24hr patch Place 1 patch onto the skin once a week.      . escitalopram (LEXAPRO) 20 MG tablet Take 20 mg by mouth daily.      . furosemide (LASIX) 80 MG tablet Take 80 mg by mouth 2 (two) times daily.       . hydrALAZINE (APRESOLINE) 100 MG tablet Take 100 mg by mouth 2 (two) times daily.      . insulin glargine (LANTUS) 100 UNIT/ML injection Inject 22 Units into the skin daily.       . isosorbide dinitrate (ISORDIL) 10 MG tablet Take 10 mg by mouth 2 (two) times daily.      Marland Kitchen labetalol (NORMODYNE) 300 MG tablet Take 300 mg by mouth 2 (two) times daily.      Marland Kitchen oxyCODONE-acetaminophen (ROXICET) 5-325 MG per tablet Take 1-2 tablets by mouth every 4 (four) hours as needed for pain.  20 tablet  0  . pravastatin (PRAVACHOL) 20 MG tablet Take 20 mg by mouth daily.       No current facility-administered medications for this visit.     Past Surgical History  Procedure Laterality Date  . Nephrectomy Right 2008    partial  . Testicle torsion reduction    . Hernia repair    . Tonsillectomy and adenoidectomy    . Av fistula placement Left 01/26/2013    Procedure: ARTERIOVENOUS (AV) FISTULA CREATION - LEFT RADIAL CEPHALIC AVF;  Surgeon: Angelia Mould, MD;  Location: [facility];  Service: Vascular;  Laterality: Left;     Allergies  Allergen Reactions  . Dilaudid [Hydromorphone Hcl] Other (  See Comments)    Abnormal behavior  . Morphine And Related Itching      Family History  Problem Relation Age of Onset  . Heart disease Mother     Heart Disease before age 36  . Deep vein thrombosis Father   . Heart attack Father      Social History Mr. Griggs reports that he has been smoking.  He has never used smokeless tobacco. Mr. Edgin reports that he drinks alcohol.   Review of Systems CONSTITUTIONAL: No weight loss, fever, chills, weakness or fatigue.  HEENT: Eyes: No visual loss, blurred vision, double vision or yellow sclerae.No hearing loss, sneezing, congestion, runny nose or sore throat.  SKIN: No rash or itching.  CARDIOVASCULAR: per HPI RESPIRATORY: No shortness of breath, cough or sputum.  GASTROINTESTINAL: No anorexia, nausea, vomiting or diarrhea. No abdominal pain or blood.  GENITOURINARY: No burning on urination, no polyuria NEUROLOGICAL: No headache, dizziness, syncope, paralysis, ataxia, numbness or tingling in the extremities. No change in bowel  or bladder control.  MUSCULOSKELETAL: No muscle, back pain, joint pain or stiffness.  LYMPHATICS: No enlarged nodes. No history of splenectomy.  PSYCHIATRIC: No history of depression or anxiety.  ENDOCRINOLOGIC: No reports of sweating, cold or heat intolerance. No polyuria or polydipsia.  Marland Kitchen   Physical Examination p 89 bp 147/78 Wt 244 lbs BMI 35 Gen: resting comfortably, no acute distress HEENT: no scleral icterus, pupils equal round and reactive, no palptable cervical adenopathy,  CV Resp: Clear to auscultation bilaterally GI: abdomen is soft, non-tender, non-distended, normal bowel sounds, no hepatosplenomegaly MSK: extremities are warm, no edema.  Skin: warm, no rash Neuro:  no focal deficits Psych: appropriate affect    Assessment and Plan  1. Systolic dysfunction - reported LVEF 40% from echo in 2010 per notes, I cannot find the actual report - denies any current symptoms - will repeat echo, if dysfunction still exists will need to alter his medication regimen  2. Preoperative evaluation - patient being considered for possible kidney transplant. He needs evaluation for cardiac ischemia prior. The modality will depend on the results of his echo, if persistent or worsening LV function will likely need cath.    F/u and further testing pending results of echo      Arnoldo Lenis, M.D., F.A.C.C.

## 2013-10-21 ENCOUNTER — Other Ambulatory Visit: Payer: Self-pay

## 2013-10-21 ENCOUNTER — Other Ambulatory Visit (INDEPENDENT_AMBULATORY_CARE_PROVIDER_SITE_OTHER): Payer: 59

## 2013-10-21 DIAGNOSIS — I509 Heart failure, unspecified: Secondary | ICD-10-CM

## 2013-10-21 DIAGNOSIS — I059 Rheumatic mitral valve disease, unspecified: Secondary | ICD-10-CM

## 2013-10-21 DIAGNOSIS — I5022 Chronic systolic (congestive) heart failure: Secondary | ICD-10-CM

## 2013-10-27 ENCOUNTER — Encounter: Payer: Self-pay | Admitting: Cardiology

## 2013-10-27 ENCOUNTER — Telehealth: Payer: Self-pay | Admitting: Cardiology

## 2013-10-27 DIAGNOSIS — R079 Chest pain, unspecified: Secondary | ICD-10-CM

## 2013-10-27 NOTE — Telephone Encounter (Signed)
Message copied by Tiajuana Amass on Wed Oct 27, 2013  4:16 PM ------      Message from: [employer]      Created: Tue Oct 26, 2013  4:54 PM       Let patient know that echo overall changes consistent with his kidney disease, but the pumping function has normalized. He needs a Lexiscan to further evaluate for ischemia. Please order Lexiscan indication is for chest pain.            Carlyle Dolly MD ------

## 2013-10-27 NOTE — Telephone Encounter (Signed)
Pt informed of echo results. Pt agreed to test. Pt will be in office one day this week to pick up instruction sheet.

## 2013-10-28 ENCOUNTER — Encounter: Payer: Self-pay | Admitting: Cardiology

## 2013-10-28 ENCOUNTER — Telehealth: Payer: Self-pay | Admitting: Cardiology

## 2013-10-28 NOTE — Telephone Encounter (Signed)
Lexiscan scheduled for 12-30-19 checking percert

## 2013-10-28 NOTE — Telephone Encounter (Signed)
Approved and in epic °

## 2013-10-29 ENCOUNTER — Telehealth: Payer: Self-pay | Admitting: Cardiology

## 2013-10-29 NOTE — Telephone Encounter (Signed)
Called and lm for pt to return call.

## 2013-10-29 NOTE — Telephone Encounter (Signed)
Pt came and picked up instruction letter for his [place].

## 2013-11-04 ENCOUNTER — Encounter (HOSPITAL_COMMUNITY)
Admission: RE | Admit: 2013-11-04 | Discharge: 2013-11-04 | Disposition: A | Discharge status: Home / Self Care | Payer: 59 | Source: Ambulatory Visit | Attending: Cardiology | Admitting: Cardiology

## 2013-11-04 ENCOUNTER — Encounter (HOSPITAL_COMMUNITY): Payer: Self-pay

## 2013-11-04 DIAGNOSIS — R079 Chest pain, unspecified: Secondary | ICD-10-CM

## 2013-11-04 DIAGNOSIS — I5022 Chronic systolic (congestive) heart failure: Secondary | ICD-10-CM

## 2013-11-04 MED ORDER — SODIUM CHLORIDE 0.9 % IJ SOLN
INTRAMUSCULAR | Status: AC
  Administered 2013-11-04: 10 mL via INTRAVENOUS
  Filled 2013-11-04: qty 10

## 2013-11-04 MED ORDER — TECHNETIUM TC 99M SESTAMIBI - CARDIOLITE
30.0000 | Freq: Once | INTRAVENOUS | Status: AC | PRN
  Administered 2013-11-04: 30 via INTRAVENOUS

## 2013-11-04 MED ORDER — TECHNETIUM TC 99M SESTAMIBI GENERIC - CARDIOLITE
10.0000 | Freq: Once | INTRAVENOUS | Status: AC | PRN
  Administered 2013-11-04: 10 via INTRAVENOUS

## 2013-11-04 MED ORDER — REGADENOSON 0.4 MG/5ML IV SOLN
INTRAVENOUS | Status: AC
  Administered 2013-11-04: 0.4 mg via INTRAVENOUS
  Filled 2013-11-04: qty 5

## 2013-11-04 NOTE — Progress Notes (Signed)
Stress Lab Nurses Notes - Hayden [name] 11/04/2013 Reason for doing test: CHF & Pre-op evaluation Type of test: Wille Glaser Nurse performing test: Gerrit Halls, RN Nuclear Medicine Tech: Redmond Baseman Echo Tech: Not Applicable MD performing test: Myles Gip Family MD: Sp[place] Test explained and consent signed: yes IV started: 22g jelco, Saline lock flushed, No redness or edema and Saline lock started in radiology Symptoms: nausea Treatment/Intervention: None Reason test stopped: protocol completed After recovery IV was: Discontinued via X-ray tech and No redness or edema Patient to return to [place]. Med at : 10:50 Patient discharged: Home Patient's Condition upon discharge was: stable Comments: During test BP 119/85 & HR 108.  Recovery BP 146/71 & HR 88.  Symptoms resolved in recovery.  Hayden [name]

## 2013-11-08 ENCOUNTER — Telehealth: Payer: Self-pay | Admitting: *Deleted

## 2013-11-08 ENCOUNTER — Encounter: Payer: Self-pay | Admitting: *Deleted

## 2013-11-08 NOTE — Telephone Encounter (Signed)
Message copied by Laurine Blazer on Mon Nov 08, 2013  3:57 PM ------      Message from: Hayden [name] F      Created: Fri Nov 05, 2013 12:49 PM       Please let patient know that his stress test findings were somewhat mixed. There is suggestion of a possible old blockage on the bottom of his heart. I'd like him to come see me in 1-2 weeks to discuss results and the possible need for further testing.            Hayden Dolly MD ------

## 2013-11-08 NOTE — Telephone Encounter (Signed)
Notes Recorded by Laurine Blazer, LPN on 8/58/8502 at 7:74 PM Left message to return call with wife.

## 2013-11-10 NOTE — Telephone Encounter (Signed)
Notes Recorded by Laurine Blazer, LPN on 08/11/1550 at 08:02 AM Left message to return call.

## 2013-11-17 ENCOUNTER — Encounter: Payer: 59 | Admitting: Cardiology

## 2013-11-17 NOTE — Progress Notes (Signed)
Encounter opened in error

## 2013-11-17 NOTE — Telephone Encounter (Signed)
Notes Recorded by Laurine Blazer, LPN on 08/12/9415 at 40:81 AM Patient notified and verbalized understanding. OV scheduled for this afternoon.

## 2013-11-19 NOTE — Telephone Encounter (Signed)
Notes Recorded by Laurine Blazer, LPN on 4/62/7035 at 00:93 AM Patient was unable to make it to the appointment on the afternoon of 11/17/2013. Okay per Dr. Harl Bowie to schedule with Jory Sims, NP in [place] office within the next 2 weeks as he will be unavailable. OV scheduled for Tuesday, 11/30/2013 at 2:30 with KL in [place]. Patient aware.

## 2013-11-30 ENCOUNTER — Ambulatory Visit (INDEPENDENT_AMBULATORY_CARE_PROVIDER_SITE_OTHER): Payer: 59 | Admitting: Adult Health

## 2013-11-30 ENCOUNTER — Encounter: Payer: Self-pay | Admitting: Adult Health

## 2013-11-30 VITALS — BP 180/92 | HR 92 | Ht 70.0 in | Wt 238.0 lb

## 2013-11-30 DIAGNOSIS — I517 Cardiomegaly: Secondary | ICD-10-CM

## 2013-11-30 DIAGNOSIS — I200 Unstable angina: Secondary | ICD-10-CM

## 2013-11-30 DIAGNOSIS — I160 Hypertensive urgency: Secondary | ICD-10-CM

## 2013-11-30 DIAGNOSIS — I10 Essential (primary) hypertension: Secondary | ICD-10-CM

## 2013-11-30 DIAGNOSIS — I249 Acute ischemic heart disease, unspecified: Secondary | ICD-10-CM

## 2013-11-30 NOTE — Assessment & Plan Note (Signed)
Stress test revealed IMPRESSION: Intermediate risk Lexiscan Cardiolite. There were no diagnostic ST segment changes. Perfusion imaging suggests the possibility of scar with peri-infarct ischemia in the mid to apical anterior wall of mild degree, also in the basal lateral wall of mild degree. There are no definitive wall motion abnormalities in these regions, mild diffuse hypokinesis with LVEF 46%. LV volumes are increased. This study does not exclude the presence of underlying ischemic heart disease.  He does not want to undergo cardiac cath unless absolutely necessary and wants to wait until seen by [facility] transplant team to determine if this is necessary to proceed with cath or not, Its is likely that he will need cath at some point. He will follow up with [place] in 3 months unless he needs to come sooner.

## 2013-11-30 NOTE — Assessment & Plan Note (Signed)
Review of recent echo demonstrates Normal LV chamber size with severe LVH and LVEF 50-55%, cannot exclude mildinferior hypokiesis in some views. There has been improvement in LVEF since prior study 2010. Consider hypertensive or hypertrophic cardiomyopathy. Other findings as noted above.  He is given a copy of this to take with him to [facility] for their records. He has not taken his antihypertensive medications as directed today. I have counseled him on this and need to take them without skipping doses

## 2013-11-30 NOTE — Progress Notes (Deleted)
Name: Hayden [name]    DOB: 05/10/1971  Age: 43 y.o.  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 Hypertension    VS Filed Vitals:   11/30/13 1431  BP: 180/92  Pulse: 92  Height: 5\' 10"  (1.778 m)  Weight: 238 lb (107.956 kg)  SpO2: 96%    Weights Current Weight  11/30/13 238 lb (107.956 kg)  10/12/13 244 lb (110.678 kg)  01/25/13 271 lb 9.6 oz (123.197 kg)    Blood Pressure  BP Readings from Last 3 Encounters:  11/30/13 180/92  10/12/13 147/78  01/26/13 122/55     Admit date:  (Not on file) Last encounter with RMR:  Visit date not found   Allergy Dilaudid and Morphine and related  Current Outpatient Prescriptions  Medication Sig Dispense Refill  . amLODipine (NORVASC) 10 MG tablet Take 10 mg by mouth daily.      Marland Kitchen aspirin EC 81 MG tablet Take 81 mg by mouth daily.      Marland Kitchen buPROPion (WELLBUTRIN SR) 150 MG 12 hr tablet Take one tab by mouth every morning & two tabs every evening      . calcitRIOL (ROCALTROL) 0.25 MCG capsule Take 0.25 mcg by mouth daily.      . calcium carbonate (TUMS - DOSED IN MG ELEMENTAL CALCIUM) 500 MG chewable tablet Chew 2 tablets by mouth 3 (three) times daily.      . cinacalcet (SENSIPAR) 30 MG tablet Take 30 mg by mouth daily.      . cloNIDine (CATAPRES - DOSED IN MG/24 HR) 0.2 mg/24hr patch Place 1 patch onto the skin once a week.      . escitalopram (LEXAPRO) 20 MG tablet Take 20 mg by mouth daily.      . hydrALAZINE (APRESOLINE) 100 MG tablet Take 100 mg by mouth 2 (two) times daily.      . isosorbide dinitrate (ISORDIL) 10 MG tablet Take 10 mg by mouth daily.       Marland Kitchen labetalol (NORMODYNE) 300 MG tablet Take 300 mg by mouth 2 (two) times daily.      Marland Kitchen oxyCODONE-acetaminophen (ROXICET) 5-325 MG per tablet Take 1-2 tablets by mouth every 4 (four) hours as needed for pain.  20 tablet  0   . pravastatin (PRAVACHOL) 20 MG tablet Take 20 mg by mouth daily.       No current facility-administered medications for this visit.    Discontinued Meds:   There are no discontinued medications.  Patient Active Problem List   Diagnosis Date Noted  . End stage renal disease 01/21/2013  . Acute coronary syndrome 09/22/2012  . History of partial Right nephrectomy for renal mass (2008) 09/22/2012  . Left ventricular hypertrophy (moderate) per ECHO 2010 09/22/2012  . Chronic systolic congestive heart failure/EF 40% PER echo 2010 09/22/2012  . Elevation of cardiac enzymes 09/22/2012  . 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 Essential hypertension, malignant 09/21/2012  . Acute kidney injury 09/21/2012  . DM2 (diabetes mellitus, type 2) 09/21/2012  . Chest pain 09/21/2012    LABS    Component Value Date/Time   NA 141 01/26/2013 0653   NA 137 09/26/2012 0500   NA 139 09/25/2012 0600   K 3.5 01/26/2013 0653   K 3.8 09/26/2012 0500  K 3.8 09/25/2012 0600   CL 99 09/26/2012 0500   CL 101 09/25/2012 0600   CL 99 09/24/2012 0502   CO2 23 09/26/2012 0500   CO2 26 09/25/2012 0600   CO2 23 09/24/2012 0502   GLUCOSE 157* 01/26/2013 0653   GLUCOSE 166* 09/26/2012 0500   GLUCOSE 178* 09/25/2012 0600   BUN 38* 09/26/2012 0500   BUN 38* 09/25/2012 0600   BUN 43* 09/24/2012 0502   CREATININE 4.44* 09/26/2012 0500   CREATININE 4.46* 09/25/2012 0600   CREATININE 4.63* 09/24/2012 0502   CALCIUM 9.3 09/26/2012 0500   CALCIUM 9.5 09/25/2012 0600   CALCIUM 8.8 09/24/2012 0502   GFRNONAA 15* 09/26/2012 0500   GFRNONAA 15* 09/25/2012 0600   GFRNONAA 15* 09/24/2012 0502   GFRAA 18* 09/26/2012 0500   GFRAA 18* 09/25/2012 0600   GFRAA 17* 09/24/2012 0502   CMP     Component Value Date/Time   NA 141 01/26/2013 0653   K 3.5 01/26/2013 0653   CL 99 09/26/2012 0500   CO2 23 09/26/2012 0500   GLUCOSE 157* 01/26/2013 0653   BUN 38* 09/26/2012 0500   CREATININE 4.44* 09/26/2012 0500   CALCIUM 9.3 09/26/2012 0500    PROT 7.5 09/21/2012 0000   ALBUMIN 2.8* 09/26/2012 0500   AST 17 09/21/2012 0000   ALT 17 09/21/2012 0000   ALKPHOS 123* 09/21/2012 0000   BILITOT 0.3 09/21/2012 0000   GFRNONAA 15* 09/26/2012 0500   GFRAA 18* 09/26/2012 0500       Component Value Date/Time   WBC 12.5* 09/26/2012 0500   WBC 10.6* 09/22/2012 0430   WBC 14.4* 09/21/2012 2120   HGB 13.6 01/26/2013 0653   HGB 11.0* 09/26/2012 0500   HGB 11.3* 09/22/2012 0430   HCT 40.0 01/26/2013 0653   HCT 32.2* 09/26/2012 0500   HCT 33.0* 09/22/2012 0430   MCV 81.7 09/26/2012 0500   MCV 81.3 09/22/2012 0430   MCV 81.1 09/21/2012 2120    Lipid Panel     Component Value Date/Time   CHOL  Value: 250        ATP III CLASSIFICATION:  <200     mg/dL   Desirable  200-239  mg/dL   Borderline High  >=240    mg/dL   High       * 12/20/2008 0820   TRIG 315* 12/20/2008 0820   HDL 37* 12/20/2008 0820   CHOLHDL 6.8 12/20/2008 0820   VLDL 63* 12/20/2008 0820   LDLCALC  Value: 150        Total Cholesterol/HDL:CHD Risk Coronary Heart Disease Risk Table                     Men   Women  1/2 Average Risk   3.4   3.3  Average Risk       5.0   4.4  2 X Average Risk   9.6   7.1  3 X Average Risk  23.4   11.0        Use the calculated Patient Ratio above and the CHD Risk Table to determine the patient's CHD Risk.        ATP III CLASSIFICATION (LDL):  <100     mg/dL   Optimal  100-129  mg/dL   Near or Above                    Optimal  130-159  mg/dL   Borderline  160-189  mg/dL  High  >190     mg/dL   Very High* 12/20/2008 0820    ABG    Component Value Date/Time   TCO2 26 12/20/2008 0439     Lab Results  Component Value Date   TSH 3.203 09/22/2012   BNP (last 3 results) No results found for this basename: PROBNP,  in the last 8760 hours Cardiac Panel (last 3 results) No results found for this basename: CKTOTAL, CKMB, TROPONINI, RELINDX,  in the last 72 hours  Iron/TIBC/Ferritin    Component Value Date/Time   IRON 32* 09/23/2012 0415   TIBC 262 09/23/2012 0415    FERRITIN 258 09/23/2012 0415     EKG Orders placed in visit on 10/12/13  . EKG 12-LEAD     Prior Assessment and Plan Problem List as of 11/30/2013     Cardiovascular and Mediastinum   Essential hypertension, malignant   Hypertensive urgency, malignant   Acute coronary syndrome   Left ventricular hypertrophy (moderate) per ECHO 6073   Chronic systolic congestive heart failure/EF 40% PER echo 2010     Endocrine   DM2 (diabetes mellitus, type 2)     Genitourinary   Acute kidney injury   End stage renal disease     Other   Chest pain   History of partial Right nephrectomy for renal mass (2008)   Elevation of cardiac enzymes       Imaging: Nm Myocar Single W/spect W/wall Motion And Ef  11/04/2013   CLINICAL DATA:  42 year old male with end-stage renal disease on hemodialysis, hypertension, type 2 diabetes mellitus, history of cardiomyopathy, now referred for the assessment of ischemia.  EXAM: MYOCARDIAL IMAGING WITH SPECT (REST AND PHARMACOLOGIC-STRESS)  GATED LEFT VENTRICULAR WALL MOTION STUDY  LEFT VENTRICULAR EJECTION FRACTION  TECHNIQUE: Standard myocardial SPECT imaging was performed after resting intravenous injection of 10 mCi Tc-23m sestamibi. Subsequently, intravenous infusion of Lexiscan was performed under the supervision of the Cardiology staff. At peak effect of the drug, 30 mCi Tc-53m sestamibi was injected intravenously and standard myocardial SPECT imaging was performed. Quantitative gated imaging was also performed to evaluate left ventricular wall motion, and estimate left ventricular ejection fraction.  FINDINGS: Baseline tracing shows sinus rhythm with LVH, leftward axis. Lexiscan bolus was given in standard fashion. Heart rate increased from 75 beats per min up to 109 beats per min, and blood pressure increased from 129/73 up to 173/75. Patient experienced nausea and shortness of breath during infusion, no chest pain. There were no diagnostic ST segment abnormalities,  and no arrhythmias were noted.  Analysis of the overall perfusion data finds adequate radiotracer uptake within the myocardium.  Tomographic views were obtained using the short axis, vertical long axis, and horizontal long axis planes. There is a small, moderate intensity, mid to apical anterior defect is partially reversible. There is a second defect that is small, mild intensity, involving the basal lateral wall that is partially reversible. Suggestive of scar with peri-infarct ischemia and both distributions.  Gated imaging reveals an EDV of 196, ESV of 105, TID ratio of 0.95, and LVEF of 46% with mild diffuse hypokinesis.  IMPRESSION: Intermediate risk Lexiscan Cardiolite. There were no diagnostic ST segment changes. Perfusion imaging suggests the possibility of scar with peri-infarct ischemia in the mid to apical anterior wall of mild degree, also in the basal lateral wall of mild degree. There are no definitive wall motion abnormalities in these regions, mild diffuse hypokinesis with LVEF 46%. LV volumes are increased. This study does not exclude  the presence of underlying ischemic heart disease.   Electronically Signed   By: Rozann Lesches M.D.   On: 11/04/2013 12:03

## 2013-11-30 NOTE — Patient Instructions (Signed)
Your physician recommends that you schedule a follow-up appointment in: 3 months with Kathryn Lawrence NP   Your physician recommends that you continue on your current medications as directed. Please refer to the Current Medication list given to you today.     Thank you for choosing [facility] !         

## 2013-11-30 NOTE — Progress Notes (Signed)
HPI:  [name] is a 43 year old patient of Dr. Harl Bowie, normally seen in [place] office, we are following for ongoing assessment and management of hypertension, systolic dysfunction, mixed CHF by history, with other history to include diabetes, end-stage renal disease, and anemia. The patient was last seen by Dr. Harl Bowie on April of 2015. He was due to be seen in May that was apparently rescheduled.   The patient has had a recent stress Myoview in April of 2015 revealing an intermediate risk Lexiscan Cardiolite with no diagnostic ST segmental changes. There were no definitive wall motion abnormalities, LVEF of 46%. Echocardiogram completed 10/21/2013 revealed normal LV chamber size with severe LVH , LVEF of 50-55%, cannot exclude mild inferior hypokinesis in some views.   There has been improvement in LVEF since prior study 2010. Severe hypertensive or hypertrophic cardiomyopathy. Patient is being considered for a kidney transplant with testing completed prior to further evaluation for same.   He comes today having cancelled appt with [facility] in order to get results of stress test and echo. He is to reschedule with them. He has not taken his BP medications yet today.    Allergies  Allergen Reactions  . Dilaudid [Hydromorphone Hcl] Other (See Comments)    Abnormal behavior  . Morphine And Related Itching    Current Outpatient Prescriptions  Medication Sig Dispense Refill  . amLODipine (NORVASC) 10 MG tablet Take 10 mg by mouth daily.      Marland Kitchen aspirin EC 81 MG tablet Take 81 mg by mouth daily.      Marland Kitchen buPROPion (WELLBUTRIN SR) 150 MG 12 hr tablet Take one tab by mouth every morning & two tabs every evening      . calcitRIOL (ROCALTROL) 0.25 MCG capsule Take 0.25 mcg by mouth daily.      . calcium carbonate (TUMS - DOSED IN MG ELEMENTAL CALCIUM) 500 MG chewable tablet Chew 2 tablets by mouth 3 (three) times daily.      . cinacalcet (SENSIPAR) 30 MG tablet Take 30 mg by mouth daily.      . cloNIDine  (CATAPRES - DOSED IN MG/24 HR) 0.2 mg/24hr patch Place 1 patch onto the skin once a week.      . escitalopram (LEXAPRO) 20 MG tablet Take 20 mg by mouth daily.      . hydrALAZINE (APRESOLINE) 100 MG tablet Take 100 mg by mouth 2 (two) times daily.      . isosorbide dinitrate (ISORDIL) 10 MG tablet Take 10 mg by mouth daily.       Marland Kitchen labetalol (NORMODYNE) 300 MG tablet Take 300 mg by mouth 2 (two) times daily.      Marland Kitchen oxyCODONE-acetaminophen (ROXICET) 5-325 MG per tablet Take 1-2 tablets by mouth every 4 (four) hours as needed for pain.  20 tablet  0  . pravastatin (PRAVACHOL) 20 MG tablet Take 20 mg by mouth daily.       No current facility-administered medications for this visit.    Past Medical History  Diagnosis Date  . History of kidney cancer   . Hypertension   . Diabetes mellitus   . CHF (congestive heart failure) 4132    Acute systolic and diastolic CHF  . Renal disorder   . Obesity   . Hyperlipidemia   . Anemia March 2014  . Cancer     Kidney  . PONV (postoperative nausea and vomiting)   . Depression     & rage --  was in counseling....great now  Past Surgical History  Procedure Laterality Date  . Nephrectomy Right 2008    partial  . Testicle torsion reduction    . Hernia repair    . Tonsillectomy and adenoidectomy    . Av fistula placement Left 01/26/2013    Procedure: ARTERIOVENOUS (AV) FISTULA CREATION - LEFT RADIAL CEPHALIC AVF;  Surgeon: Angelia Mould, MD;  Location: [facility] OR;  Service: Vascular;  Laterality: Left;    ROS: Review of systems complete and found to be negative unless listed above  PHYSICAL EXAM BP 180/92  Pulse 92  Ht 5\' 10"  (1.778 m)  Wt 238 lb (107.956 kg)  BMI 34.15 kg/m2  SpO2 96% General: Well developed, well nourished, in no acute distress Head: Eyes PERRLA, No xanthomas.   Normal cephalic and atramatic  Lungs: Clear bilaterally to auscultation and percussion. Heart: HRRR S1 S2, with soft S4. .  Pulses are 2+ & equal.             No carotid bruit. No JVD.  No abdominal bruits. No femoral bruits. Abdomen: Bowel sounds are positive, abdomen soft and non-tender without masses or                  Hernia's noted. Msk:  Back normal, normal gait. Normal strength and tone for age. Extremities: No clubbing, cyanosis or edema.  DP +1 Neuro: Alert and oriented X 3. Psych:  Good affect, responds appropriately   ASSESSMENT AND PLAN

## 2013-11-30 NOTE — Assessment & Plan Note (Signed)
BP is elevated today. Has not taken his BP meds yet. I have advised him to take his medications as soon as he gets home.,

## 2014-05-16 ENCOUNTER — Emergency Department (HOSPITAL_COMMUNITY): Payer: 59

## 2014-05-16 ENCOUNTER — Encounter (HOSPITAL_COMMUNITY): Payer: Self-pay | Admitting: *Deleted

## 2014-05-16 ENCOUNTER — Emergency Department (HOSPITAL_COMMUNITY)
Admission: EM | Admit: 2014-05-16 | Discharge: 2014-05-17 | Disposition: A | Discharge status: Home / Self Care | Payer: 59 | Attending: Emergency Medicine | Admitting: Emergency Medicine

## 2014-05-16 DIAGNOSIS — Z79899 Other long term (current) drug therapy: Secondary | ICD-10-CM | POA: Diagnosis not present

## 2014-05-16 DIAGNOSIS — Z7982 Long term (current) use of aspirin: Secondary | ICD-10-CM | POA: Insufficient documentation

## 2014-05-16 DIAGNOSIS — Z87891 Personal history of nicotine dependence: Secondary | ICD-10-CM | POA: Insufficient documentation

## 2014-05-16 DIAGNOSIS — R0600 Dyspnea, unspecified: Secondary | ICD-10-CM | POA: Insufficient documentation

## 2014-05-16 DIAGNOSIS — E785 Hyperlipidemia, unspecified: Secondary | ICD-10-CM | POA: Diagnosis not present

## 2014-05-16 DIAGNOSIS — Z85528 Personal history of other malignant neoplasm of kidney: Secondary | ICD-10-CM | POA: Diagnosis not present

## 2014-05-16 DIAGNOSIS — F329 Major depressive disorder, single episode, unspecified: Secondary | ICD-10-CM | POA: Diagnosis not present

## 2014-05-16 DIAGNOSIS — I509 Heart failure, unspecified: Secondary | ICD-10-CM | POA: Diagnosis not present

## 2014-05-16 DIAGNOSIS — Z862 Personal history of diseases of the blood and blood-forming organs and certain disorders involving the immune mechanism: Secondary | ICD-10-CM | POA: Insufficient documentation

## 2014-05-16 DIAGNOSIS — N186 End stage renal disease: Secondary | ICD-10-CM | POA: Diagnosis not present

## 2014-05-16 DIAGNOSIS — R0602 Shortness of breath: Secondary | ICD-10-CM | POA: Diagnosis present

## 2014-05-16 DIAGNOSIS — E669 Obesity, unspecified: Secondary | ICD-10-CM | POA: Diagnosis not present

## 2014-05-16 DIAGNOSIS — E119 Type 2 diabetes mellitus without complications: Secondary | ICD-10-CM | POA: Diagnosis not present

## 2014-05-16 DIAGNOSIS — R109 Unspecified abdominal pain: Secondary | ICD-10-CM

## 2014-05-16 DIAGNOSIS — I120 Hypertensive chronic kidney disease with stage 5 chronic kidney disease or end stage renal disease: Secondary | ICD-10-CM | POA: Diagnosis not present

## 2014-05-16 DIAGNOSIS — M545 Low back pain: Secondary | ICD-10-CM | POA: Insufficient documentation

## 2014-05-16 LAB — CBC WITH DIFFERENTIAL/PLATELET
BASOS ABS: 0.1 10*3/uL (ref 0.0–0.1)
Basophils Relative: 0 % (ref 0–1)
EOS PCT: 1 % (ref 0–5)
Eosinophils Absolute: 0.1 10*3/uL (ref 0.0–0.7)
HCT: 33.3 % — ABNORMAL LOW (ref 39.0–52.0)
Hemoglobin: 11.3 g/dL — ABNORMAL LOW (ref 13.0–17.0)
LYMPHS PCT: 7 % — AB (ref 12–46)
Lymphs Abs: 1 10*3/uL (ref 0.7–4.0)
MCH: 28.5 pg (ref 26.0–34.0)
MCHC: 33.9 g/dL (ref 30.0–36.0)
MCV: 83.9 fL (ref 78.0–100.0)
Monocytes Absolute: 1 10*3/uL (ref 0.1–1.0)
Monocytes Relative: 7 % (ref 3–12)
NEUTROS ABS: 12.8 10*3/uL — AB (ref 1.7–7.7)
Neutrophils Relative %: 85 % — ABNORMAL HIGH (ref 43–77)
PLATELETS: 342 10*3/uL (ref 150–400)
RBC: 3.97 MIL/uL — ABNORMAL LOW (ref 4.22–5.81)
RDW: 14.6 % (ref 11.5–15.5)
WBC: 15 10*3/uL — AB (ref 4.0–10.5)

## 2014-05-16 LAB — COMPREHENSIVE METABOLIC PANEL
ALK PHOS: 92 U/L (ref 39–117)
ALT: 14 U/L (ref 0–53)
AST: 13 U/L (ref 0–37)
Albumin: 3.7 g/dL (ref 3.5–5.2)
Anion gap: 16 — ABNORMAL HIGH (ref 5–15)
BUN: 27 mg/dL — ABNORMAL HIGH (ref 6–23)
CALCIUM: 8.9 mg/dL (ref 8.4–10.5)
CHLORIDE: 90 meq/L — AB (ref 96–112)
CO2: 29 meq/L (ref 19–32)
Creatinine, Ser: 4.32 mg/dL — ABNORMAL HIGH (ref 0.50–1.35)
GFR calc Af Amer: 18 mL/min — ABNORMAL LOW (ref >90)
GFR calc non Af Amer: 16 mL/min — ABNORMAL LOW (ref >90)
Glucose, Bld: 235 mg/dL — ABNORMAL HIGH (ref 70–99)
POTASSIUM: 3.6 meq/L — AB (ref 3.7–5.3)
SODIUM: 135 meq/L — AB (ref 137–147)
Total Bilirubin: 1.6 mg/dL — ABNORMAL HIGH (ref 0.3–1.2)
Total Protein: 8 g/dL (ref 6.0–8.3)

## 2014-05-16 LAB — I-STAT CG4 LACTIC ACID, ED: Lactic Acid, Venous: 1.43 mmol/L (ref 0.5–2.2)

## 2014-05-16 LAB — PRO B NATRIURETIC PEPTIDE: PRO B NATRI PEPTIDE: 16925 pg/mL — AB (ref 0–125)

## 2014-05-16 LAB — I-STAT TROPONIN, ED: Troponin i, poc: 0.04 ng/mL (ref 0.00–0.08)

## 2014-05-16 NOTE — ED Provider Notes (Signed)
CSN: [id number]     Arrival date & time 05/16/14  1738 History   First MD Initiated Contact with Patient 05/16/14 1957     Chief Complaint  Patient presents with  . Shortness of Breath     (Consider location/radiation/quality/duration/timing/severity/associated sxs/prior Treatment) HPI Comments: 43 year old male with history of diabetes, hypertension, acute kidney injury, dialysis, LVH, congestive heart failure 40% ejection fraction 2010 presentswith bilateral flank pain and dyspnea. Patient has been significantly over his dry weight recently and had dialysis today removing significant amount of fluids. Patient felt he actually was feeling worse after dialysis and his had shortness of breath for the past 3 days worse at night. Patient was told he had a yellow appearance, denies any significant liver history and no focal right upper quadrant discomfort. Patient denies any kidney stone history, has had a cough recently without fevers or chills. Patient feels contraction/ache bilateral mid back.  Patient is a 43 y.o. male presenting with shortness of breath. The history is provided by the patient.  Shortness of Breath Associated symptoms: cough   Associated symptoms: no abdominal pain, no chest pain, no fever, no headaches, no neck pain, no rash and no vomiting     Past Medical History  Diagnosis Date  . History of kidney cancer   . Hypertension   . Diabetes mellitus   . CHF (congestive heart failure) 8841    Acute systolic and diastolic CHF  . Renal disorder   . Obesity   . Hyperlipidemia   . Anemia March 2014  . Cancer     Kidney  . PONV (postoperative nausea and vomiting)   . Depression     & rage --  was in counseling....great now   Past Surgical History  Procedure Laterality Date  . Nephrectomy Right 2008    partial  . Testicle torsion reduction    . Hernia repair    . Tonsillectomy and adenoidectomy    . Av fistula placement Left 01/26/2013    Procedure: ARTERIOVENOUS  (AV) FISTULA CREATION - LEFT RADIAL CEPHALIC AVF;  Surgeon: Angelia Mould, MD;  Location: [facility] OR;  Service: Vascular;  Laterality: Left;   Family History  Problem Relation Age of Onset  . Heart disease Mother     Heart Disease before age 55  . Deep vein thrombosis Father   . Heart attack Father    History  Substance Use Topics  . Smoking status: Former Smoker -- 1.50 packs/day for 10 years    Quit date: 08/01/2013  . Smokeless tobacco: Never Used  . Alcohol Use: Yes     Comment: "6 pack occ."-- lasts him six months    Review of Systems  Constitutional: Positive for fatigue. Negative for fever and chills.  HENT: Negative for congestion.   Eyes: Negative for visual disturbance.  Respiratory: Positive for cough and shortness of breath.   Cardiovascular: Negative for chest pain.  Gastrointestinal: Negative for vomiting and abdominal pain.  Genitourinary: Positive for flank pain.  Musculoskeletal: Positive for back pain. Negative for neck pain and neck stiffness.  Skin: Negative for rash.  Neurological: Negative for light-headedness and headaches.      Allergies  Dilaudid and Morphine and related  Home Medications   Prior to Admission medications   Medication Sig Start Date End Date Taking? Authorizing Provider  ALPRAZolam Duanne Moron) 0.5 MG tablet Take 0.5 mg by mouth daily. 03/18/14  Yes Historical Provider, MD  amLODipine (NORVASC) 10 MG tablet Take 10 mg by mouth daily.  Yes Historical Provider, MD  aspirin EC 81 MG tablet Take 81 mg by mouth daily.   Yes Historical Provider, MD  buPROPion (WELLBUTRIN SR) 150 MG 12 hr tablet Take one tab by mouth every morning & two tabs every evening   Yes Historical Provider, MD  cinacalcet (SENSIPAR) 30 MG tablet Take 30 mg by mouth daily.   Yes Historical Provider, MD  cloNIDine (CATAPRES - DOSED IN MG/24 HR) 0.2 mg/24hr patch Place 1 patch onto the skin once a week.   Yes Historical Provider, MD  escitalopram (LEXAPRO) 20 MG  tablet Take 20 mg by mouth daily.   Yes Historical Provider, MD  hydrALAZINE (APRESOLINE) 100 MG tablet Take 100 mg by mouth 2 (two) times daily.   Yes Historical Provider, MD  isosorbide dinitrate (ISORDIL) 10 MG tablet Take 10 mg by mouth daily.    Yes Historical Provider, MD  labetalol (NORMODYNE) 100 MG tablet Take 100 mg by mouth 2 (two) times daily.   Yes Historical Provider, MD  omeprazole (PRILOSEC) 20 MG capsule Take 20 mg by mouth daily. 04/18/14  Yes Historical Provider, MD  pravastatin (PRAVACHOL) 20 MG tablet Take 20 mg by mouth daily.   Yes Historical Provider, MD  labetalol (NORMODYNE) 300 MG tablet Take 300 mg by mouth 2 (two) times daily.    Historical Provider, MD  oxyCODONE-acetaminophen (ROXICET) 5-325 MG per tablet Take 1-2 tablets by mouth every 4 (four) hours as needed for pain. Patient not taking: Reported on 05/16/2014 01/26/13   Angelia Mould, MD   BP 144/72 mmHg  Pulse 86  Temp(Src) 97.9 F (36.6 C)  Resp 20  Ht 5\' 10"  (1.778 m)  Wt 240 lb (108.863 kg)  BMI 34.44 kg/m2  SpO2 94% Physical Exam  Constitutional: He is oriented to person, place, and time. He appears well-developed and well-nourished.  HENT:  Head: Normocephalic and atraumatic.  Eyes: Conjunctivae are normal. Right eye exhibits no discharge. Left eye exhibits no discharge.  Neck: Normal range of motion. Neck supple. No tracheal deviation present.  Cardiovascular: Normal rate and regular rhythm.   Pulmonary/Chest: Effort normal and breath sounds normal.  Abdominal: Soft. He exhibits no distension. There is no tenderness. There is no guarding.  Musculoskeletal: He exhibits tenderness. He exhibits no edema.  Patient has paraspinal tenderness upper lumbar lower thoracic no midline tenderness, no step-off, no sign of infection.  Neurological: He is alert and oriented to person, place, and time.  Patient has equal strength 5+ bilateral with flexion extension at major joints and lower extremities.   Skin: Skin is warm. No rash noted.  Psychiatric: He has a normal mood and affect.  Nursing note and vitals reviewed.   ED Course  Procedures (including critical care time)   EMERGENCY DEPARTMENT [place] CARDIAC EXAM "Study: Limited Ultrasound of the heart and pericardium"   INDICATIONS:Dyspnea Multiple views of the heart and pericardium were obtained in real-time with a multi-frequency probe.  PERFORMED QA:STMHDQ  IMAGES ARCHIVED?: Yes  FINDINGS: No pericardial effusion and Tamponade physiology absent  LIMITATIONS:  Body habitus  VIEWS USED: Subcostal 4 chamber, Parasternal long axis, Parasternal short axis and Apical 4 chamber   INTERPRETATION: Cardiac activity present and Cardiac tamponade absent   Labs Review Labs Reviewed  CBC WITH DIFFERENTIAL - Abnormal; Notable for the following:    WBC 15.0 (*)    RBC 3.97 (*)    Hemoglobin 11.3 (*)    HCT 33.3 (*)    Neutrophils Relative % 85 (*)  Neutro Abs 12.8 (*)    Lymphocytes Relative 7 (*)    All other components within normal limits  COMPREHENSIVE METABOLIC PANEL - Abnormal; Notable for the following:    Sodium 135 (*)    Potassium 3.6 (*)    Chloride 90 (*)    Glucose, Bld 235 (*)    BUN 27 (*)    Creatinine, Ser 4.32 (*)    Total Bilirubin 1.6 (*)    GFR calc non Af Amer 16 (*)    GFR calc Af Amer 18 (*)    Anion gap 16 (*)    All other components within normal limits  PRO B NATRIURETIC PEPTIDE - Abnormal; Notable for the following:    Pro B Natriuretic peptide (BNP) 16925.0 (*)    All other components within normal limits  TROPONIN I  URINALYSIS, ROUTINE W REFLEX MICROSCOPIC  I-STAT CG4 LACTIC ACID, ED  Randolm Idol, ED    Imaging Review Dg Chest 2 View  05/16/2014   CLINICAL DATA:  Shortness of breath for 3 days. History of hypertension, diabetes, CHF, anemia, kidney cancer. Previous smoker.  EXAM: CHEST  2 VIEW  COMPARISON:  09/21/2012  FINDINGS: The heart size and mediastinal contours are within  normal limits. Both lungs are clear. The visualized skeletal structures are unremarkable.  IMPRESSION: No active cardiopulmonary disease.   Electronically Signed   By: Lucienne Capers M.D.   On: 05/16/2014 21:26   Ct Renal Stone Study  05/16/2014   CLINICAL DATA:  Bilateral flank pain for several weeks  EXAM: CT ABDOMEN AND PELVIS WITHOUT CONTRAST  TECHNIQUE: Multidetector CT imaging of the abdomen and pelvis was performed following the standard protocol without IV contrast.  COMPARISON:  12/30/2012.  FINDINGS: The lung bases are free of acute infiltrate or sizable effusion. The liver, spleen, right adrenal gland and pancreas are all normal in their CT appearance. The gallbladder is partially distended. No definitive cholelithiasis is seen. The left adrenal gland demonstrates a myelolipoma stable from prior MRI.  Kidneys are well visualized bilaterally without renal calculi or obstructive changes. A lower pole cyst is noted in the right kidney stable from the previous MR. Fecal material is noted throughout the colon. The appendix is within normal limits. No significant diverticular change is noted.  The bladder is partially distended. No pelvic mass lesion or sidewall adenopathy is noted. Mild aortoiliac calcifications are seen. The osseous structures show no acute abnormality.  IMPRESSION: Right renal cyst stable from the previous exam.  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  No acute abnormality is noted.   Electronically Signed   By: Inez Catalina M.D.   On: 05/16/2014 22:09     EKG Interpretation   Date/Time:  Monday May 16 2014 18:56:54 EST Ventricular Rate:  91 PR Interval:  163 QRS Duration: 107 QT Interval:  429 QTC Calculation: 528 R Axis:   -46 Text Interpretation:  Sinus rhythm Left anterior fascicular block Abnormal  R-wave progression, late transition LVH with secondary repolarization  abnormality Prolonged QT interval Baseline wander in lead(s) V1 Confirmed  by  Iaan Oregel  MD, Jaylen Claude ([id number]) on 05/16/2014 8:00:31 PM      MDM   Final diagnoses:  Dyspnea  Bilateral flank pain   Patient was significant renal disease and dialysis history presents with worsening flank pain and shortness of breath after dialysis. Discussed differential including pulmonary edema, pneumonia, atypical CAD, kidney stone, other. Chest x-ray reviewed no acute finding. CT scan ordered to look for  cause of flank pain, clinically mostly musculoskeletal in exam. Pt unable to give UA sample.  Patient improved on reassessment. With renal disease and dialysis and mild shortness of breath bedside ultrasound done no pericardial effusion. Chest x-ray reviewed no acute findings. CT stone study no acute findings. Discussed close outpatient follow. Clinically mild pulmonary congestion  Results and differential diagnosis were discussed with the patient/parent/guardian. Close follow up outpatient was discussed, comfortable with the plan.   Medications - No data to display  Filed Vitals:   05/16/14 2000 05/16/14 2030 05/16/14 2100 05/16/14 2331  BP: 137/66 144/72 133/65 144/72  Pulse: 91 92 90 86  Temp:      Resp: 20 20 22 20   Height:      Weight:      SpO2: 95% 95% 95% 94%    Final diagnoses:  Dyspnea  Bilateral flank pain    .       Mariea Clonts, MD 05/16/14 [phone]

## 2014-05-16 NOTE — Discharge Instructions (Signed)
If you were given medicines take as directed.  If you are on coumadin or contraceptives realize their levels and effectiveness is altered by many different medicines.  If you have any reaction (rash, tongues swelling, other) to the medicines stop taking and see a physician.   Please follow up as directed and return to the ER or see a physician for new or worsening symptoms.  Thank you. Filed Vitals:   05/16/14 2000 05/16/14 2030 05/16/14 2100 05/16/14 2331  BP: 137/66 144/72 133/65 144/72  Pulse: 91 92 90 86  Temp:      Resp: 20 20 22 20   Height:      Weight:      SpO2: 95% 95% 95% 94%

## 2014-05-16 NOTE — ED Notes (Addendum)
Pt reports having dialysis today and being 7.8 lbs over his dry weight and c/o pain in bilateral flanks for 2-3 weeks. Pt reports they told him at dialysis that he was yellow.  Pt c/o worsening back pain with "contractions" around kidneys.  Pt also reports SOB x3 days mostly at night. Pt denies any blood in urine.

## 2014-05-16 NOTE — ED Notes (Addendum)
Pt in c/o lower back pain and bilateral flanks, also increased shortness of breath- pt was 7.6kg over his dry weight this morning before dialysis which is 2-3kg more than normal, he was told he was 1.5kg over today after treatment, also told at dialysis today that he was yellow today, sent here for further evaluation

## 2014-05-16 NOTE — ED Notes (Signed)
Pt is aware urine is needed for testing. Pt stated he had dialysis this morning so he does not have to urinate at this time.

## 2014-05-16 NOTE — ED Notes (Signed)
Spoke to Dr. Reather Converse. Pt ok to drink fluids to create a urine sample.  Pt given fluids.

## 2014-12-26 ENCOUNTER — Other Ambulatory Visit: Payer: Self-pay

## 2015-04-06 ENCOUNTER — Ambulatory Visit
Admission: RE | Admit: 2015-04-06 | Discharge: 2015-04-06 | Disposition: A | Discharge status: Home / Self Care | Payer: BLUE CROSS/BLUE SHIELD | Source: Ambulatory Visit | Attending: Nephrology | Admitting: Nephrology

## 2015-04-06 ENCOUNTER — Ambulatory Visit: Payer: Self-pay

## 2015-04-06 ENCOUNTER — Other Ambulatory Visit: Payer: Self-pay | Admitting: Nephrology

## 2015-04-06 DIAGNOSIS — S50859A Superficial foreign body of unspecified forearm, initial encounter: Secondary | ICD-10-CM

## 2015-06-19 ENCOUNTER — Emergency Department (HOSPITAL_COMMUNITY): Payer: BLUE CROSS/BLUE SHIELD

## 2015-06-19 ENCOUNTER — Inpatient Hospital Stay (HOSPITAL_COMMUNITY)
Admission: EM | Admit: 2015-06-19 | Discharge: 2015-06-21 | DRG: 064 | Disposition: A | Discharge status: Home / Self Care | Payer: BLUE CROSS/BLUE SHIELD | Attending: Internal Medicine | Admitting: Internal Medicine

## 2015-06-19 ENCOUNTER — Encounter (HOSPITAL_COMMUNITY): Payer: Self-pay | Admitting: Cardiology

## 2015-06-19 DIAGNOSIS — Z87891 Personal history of nicotine dependence: Secondary | ICD-10-CM | POA: Diagnosis not present

## 2015-06-19 DIAGNOSIS — I120 Hypertensive chronic kidney disease with stage 5 chronic kidney disease or end stage renal disease: Secondary | ICD-10-CM | POA: Diagnosis present

## 2015-06-19 DIAGNOSIS — F419 Anxiety disorder, unspecified: Secondary | ICD-10-CM | POA: Diagnosis present

## 2015-06-19 DIAGNOSIS — F32A Depression, unspecified: Secondary | ICD-10-CM | POA: Diagnosis present

## 2015-06-19 DIAGNOSIS — Z885 Allergy status to narcotic agent status: Secondary | ICD-10-CM

## 2015-06-19 DIAGNOSIS — N2581 Secondary hyperparathyroidism of renal origin: Secondary | ICD-10-CM | POA: Diagnosis present

## 2015-06-19 DIAGNOSIS — F329 Major depressive disorder, single episode, unspecified: Secondary | ICD-10-CM | POA: Diagnosis present

## 2015-06-19 DIAGNOSIS — I639 Cerebral infarction, unspecified: Secondary | ICD-10-CM | POA: Diagnosis present

## 2015-06-19 DIAGNOSIS — Z6834 Body mass index (BMI) 34.0-34.9, adult: Secondary | ICD-10-CM | POA: Diagnosis not present

## 2015-06-19 DIAGNOSIS — Z905 Acquired absence of kidney: Secondary | ICD-10-CM | POA: Diagnosis not present

## 2015-06-19 DIAGNOSIS — D72829 Elevated white blood cell count, unspecified: Secondary | ICD-10-CM | POA: Diagnosis present

## 2015-06-19 DIAGNOSIS — Z992 Dependence on renal dialysis: Secondary | ICD-10-CM

## 2015-06-19 DIAGNOSIS — E1122 Type 2 diabetes mellitus with diabetic chronic kidney disease: Secondary | ICD-10-CM | POA: Diagnosis present

## 2015-06-19 DIAGNOSIS — I739 Peripheral vascular disease, unspecified: Secondary | ICD-10-CM | POA: Diagnosis present

## 2015-06-19 DIAGNOSIS — I10 Essential (primary) hypertension: Secondary | ICD-10-CM

## 2015-06-19 DIAGNOSIS — E669 Obesity, unspecified: Secondary | ICD-10-CM | POA: Diagnosis present

## 2015-06-19 DIAGNOSIS — M50221 Other cervical disc displacement at C4-C5 level: Secondary | ICD-10-CM | POA: Diagnosis present

## 2015-06-19 DIAGNOSIS — Z7982 Long term (current) use of aspirin: Secondary | ICD-10-CM

## 2015-06-19 DIAGNOSIS — Z79899 Other long term (current) drug therapy: Secondary | ICD-10-CM

## 2015-06-19 DIAGNOSIS — D631 Anemia in chronic kidney disease: Secondary | ICD-10-CM | POA: Diagnosis present

## 2015-06-19 DIAGNOSIS — R202 Paresthesia of skin: Secondary | ICD-10-CM

## 2015-06-19 DIAGNOSIS — K219 Gastro-esophageal reflux disease without esophagitis: Secondary | ICD-10-CM | POA: Diagnosis present

## 2015-06-19 DIAGNOSIS — E118 Type 2 diabetes mellitus with unspecified complications: Secondary | ICD-10-CM

## 2015-06-19 DIAGNOSIS — N186 End stage renal disease: Secondary | ICD-10-CM | POA: Diagnosis present

## 2015-06-19 DIAGNOSIS — E785 Hyperlipidemia, unspecified: Secondary | ICD-10-CM | POA: Diagnosis present

## 2015-06-19 DIAGNOSIS — Z9119 Patient's noncompliance with other medical treatment and regimen: Secondary | ICD-10-CM | POA: Diagnosis not present

## 2015-06-19 DIAGNOSIS — Z85528 Personal history of other malignant neoplasm of kidney: Secondary | ICD-10-CM | POA: Diagnosis not present

## 2015-06-19 DIAGNOSIS — M50223 Other cervical disc displacement at C6-C7 level: Secondary | ICD-10-CM | POA: Diagnosis present

## 2015-06-19 DIAGNOSIS — F418 Other specified anxiety disorders: Secondary | ICD-10-CM

## 2015-06-19 DIAGNOSIS — Z8249 Family history of ischemic heart disease and other diseases of the circulatory system: Secondary | ICD-10-CM

## 2015-06-19 DIAGNOSIS — I6381 Other cerebral infarction due to occlusion or stenosis of small artery: Secondary | ICD-10-CM | POA: Diagnosis present

## 2015-06-19 DIAGNOSIS — E119 Type 2 diabetes mellitus without complications: Secondary | ICD-10-CM

## 2015-06-19 DIAGNOSIS — I6789 Other cerebrovascular disease: Secondary | ICD-10-CM | POA: Diagnosis not present

## 2015-06-19 DIAGNOSIS — R200 Anesthesia of skin: Secondary | ICD-10-CM

## 2015-06-19 LAB — URINE MICROSCOPIC-ADD ON
BACTERIA UA: NONE SEEN
RBC / HPF: NONE SEEN RBC/hpf (ref 0–5)

## 2015-06-19 LAB — CBC
HEMATOCRIT: 31.3 % — AB (ref 39.0–52.0)
Hemoglobin: 9.7 g/dL — ABNORMAL LOW (ref 13.0–17.0)
MCH: 27.3 pg (ref 26.0–34.0)
MCHC: 31 g/dL (ref 30.0–36.0)
MCV: 88.2 fL (ref 78.0–100.0)
Platelets: 368 10*3/uL (ref 150–400)
RBC: 3.55 MIL/uL — AB (ref 4.22–5.81)
RDW: 14.6 % (ref 11.5–15.5)
WBC: 12.7 10*3/uL — ABNORMAL HIGH (ref 4.0–10.5)

## 2015-06-19 LAB — BASIC METABOLIC PANEL
ANION GAP: 13 (ref 5–15)
BUN: 62 mg/dL — AB (ref 6–20)
CO2: 25 mmol/L (ref 22–32)
Calcium: 8.6 mg/dL — ABNORMAL LOW (ref 8.9–10.3)
Chloride: 103 mmol/L (ref 101–111)
Creatinine, Ser: 10.2 mg/dL — ABNORMAL HIGH (ref 0.61–1.24)
GFR calc Af Amer: 6 mL/min — ABNORMAL LOW (ref >60)
GFR, EST NON AFRICAN AMERICAN: 5 mL/min — AB (ref >60)
Glucose, Bld: 148 mg/dL — ABNORMAL HIGH (ref 65–99)
Potassium: 4.2 mmol/L (ref 3.5–5.1)
SODIUM: 141 mmol/L (ref 135–145)

## 2015-06-19 LAB — URINALYSIS, ROUTINE W REFLEX MICROSCOPIC
Bilirubin Urine: NEGATIVE
Glucose, UA: 100 mg/dL — AB
Hgb urine dipstick: NEGATIVE
Ketones, ur: NEGATIVE mg/dL
LEUKOCYTES UA: NEGATIVE
Nitrite: NEGATIVE
PH: 6.5 (ref 5.0–8.0)
Specific Gravity, Urine: 1.012 (ref 1.005–1.030)

## 2015-06-19 LAB — I-STAT TROPONIN, ED: Troponin i, poc: 0.03 ng/mL (ref 0.00–0.08)

## 2015-06-19 LAB — CBG MONITORING, ED: Glucose-Capillary: 143 mg/dL — ABNORMAL HIGH (ref 65–99)

## 2015-06-19 MED ORDER — LORAZEPAM 2 MG/ML IJ SOLN
1.0000 mg | Freq: Once | INTRAMUSCULAR | Status: AC | PRN
  Administered 2015-06-19: 1 mg via INTRAVENOUS
  Filled 2015-06-19: qty 1

## 2015-06-19 MED ORDER — SENNOSIDES-DOCUSATE SODIUM 8.6-50 MG PO TABS: 1.0000 | ORAL_TABLET | Freq: Every evening | ORAL | Status: DC | PRN

## 2015-06-19 MED ORDER — LABETALOL HCL 100 MG PO TABS
50.0000 mg | ORAL_TABLET | Freq: Two times a day (BID) | ORAL | Status: DC
  Administered 2015-06-19 – 2015-06-20 (×2): 50 mg via ORAL
  Filled 2015-06-19 (×2): qty 1

## 2015-06-19 MED ORDER — AMLODIPINE BESYLATE 10 MG PO TABS
10.0000 mg | ORAL_TABLET | Freq: Every day | ORAL | Status: DC
  Administered 2015-06-20: 10 mg via ORAL
  Filled 2015-06-19: qty 1

## 2015-06-19 MED ORDER — ALPRAZOLAM 0.5 MG PO TABS
1.0000 mg | ORAL_TABLET | Freq: Every evening | ORAL | Status: DC | PRN
  Administered 2015-06-19 – 2015-06-20 (×2): 1 mg via ORAL
  Filled 2015-06-19 (×2): qty 2

## 2015-06-19 MED ORDER — BUPROPION HCL ER (SR) 150 MG PO TB12
150.0000 mg | ORAL_TABLET | Freq: Every day | ORAL | Status: DC
  Administered 2015-06-20 – 2015-06-21 (×2): 150 mg via ORAL
  Filled 2015-06-19 (×2): qty 1

## 2015-06-19 MED ORDER — HYDRALAZINE HCL 50 MG PO TABS
100.0000 mg | ORAL_TABLET | Freq: Two times a day (BID) | ORAL | Status: DC
  Administered 2015-06-20: 100 mg via ORAL
  Filled 2015-06-19: qty 2

## 2015-06-19 MED ORDER — ASPIRIN EC 81 MG PO TBEC
81.0000 mg | DELAYED_RELEASE_TABLET | Freq: Every day | ORAL | Status: DC
  Administered 2015-06-20 – 2015-06-21 (×2): 81 mg via ORAL
  Filled 2015-06-19 (×2): qty 1

## 2015-06-19 MED ORDER — STROKE: EARLY STAGES OF RECOVERY BOOK
Freq: Once | Status: AC
  Administered 2015-06-20: 03:00:00
  Filled 2015-06-19: qty 1

## 2015-06-19 MED ORDER — HEPARIN SODIUM (PORCINE) 5000 UNIT/ML IJ SOLN
5000.0000 [IU] | Freq: Three times a day (TID) | INTRAMUSCULAR | Status: DC
  Administered 2015-06-19 – 2015-06-21 (×4): 5000 [IU] via SUBCUTANEOUS
  Filled 2015-06-19 (×6): qty 1

## 2015-06-19 MED ORDER — VITAMIN D 1000 UNITS PO TABS
1000.0000 [IU] | ORAL_TABLET | Freq: Every day | ORAL | Status: DC
  Administered 2015-06-20: 1000 [IU] via ORAL
  Filled 2015-06-19: qty 1

## 2015-06-19 MED ORDER — CINACALCET HCL 30 MG PO TABS
30.0000 mg | ORAL_TABLET | Freq: Every day | ORAL | Status: DC
  Administered 2015-06-20 – 2015-06-21 (×2): 30 mg via ORAL
  Filled 2015-06-19 (×3): qty 1

## 2015-06-19 MED ORDER — BUPROPION HCL ER (SR) 150 MG PO TB12
300.0000 mg | ORAL_TABLET | Freq: Every day | ORAL | Status: DC
  Administered 2015-06-19 – 2015-06-20 (×2): 300 mg via ORAL
  Filled 2015-06-19 (×3): qty 2

## 2015-06-19 MED ORDER — BUPROPION HCL ER (SR) 150 MG PO TB12: 150.0000 mg | ORAL_TABLET | Freq: Two times a day (BID) | ORAL | Status: DC

## 2015-06-19 MED ORDER — ESCITALOPRAM OXALATE 10 MG PO TABS
20.0000 mg | ORAL_TABLET | Freq: Every day | ORAL | Status: DC
  Administered 2015-06-20 – 2015-06-21 (×2): 20 mg via ORAL
  Filled 2015-06-19 (×2): qty 2

## 2015-06-19 MED ORDER — PANTOPRAZOLE SODIUM 40 MG PO TBEC
80.0000 mg | DELAYED_RELEASE_TABLET | Freq: Every day | ORAL | Status: DC
  Administered 2015-06-20 – 2015-06-21 (×2): 80 mg via ORAL
  Filled 2015-06-19 (×2): qty 2

## 2015-06-19 MED ORDER — FUROSEMIDE 80 MG PO TABS
80.0000 mg | ORAL_TABLET | Freq: Two times a day (BID) | ORAL | Status: DC
  Administered 2015-06-20: 80 mg via ORAL
  Filled 2015-06-19: qty 1

## 2015-06-19 MED ORDER — PRAVASTATIN SODIUM 20 MG PO TABS
20.0000 mg | ORAL_TABLET | Freq: Every day | ORAL | Status: DC
  Administered 2015-06-20 – 2015-06-21 (×2): 20 mg via ORAL
  Filled 2015-06-19 (×2): qty 1

## 2015-06-19 MED ORDER — CLONIDINE HCL 0.2 MG/24HR TD PTWK: 0.2000 mg | MEDICATED_PATCH | TRANSDERMAL | Status: DC

## 2015-06-19 NOTE — ED Provider Notes (Signed)
CSN: [id number]     Arrival date & time 06/19/15  1422 History   First MD Initiated Contact with Patient 06/19/15 1451     Chief Complaint  Patient presents with  . Numbness     (Consider location/radiation/quality/duration/timing/severity/associated sxs/prior Treatment) HPI Patient reports that 2 AM on Saturday night he noticed numbness that encompasses the whole right side of his body. He reports that it's a sensation as if things have half weight fallen asleep. He denies he has had weakness. He denies that he's been unable to move his extremities, walk or pick things up. His companion however notes that today when he had a cough in his right hand he did end up dropping it. He denies headache in association with this. He does describe this sensation is also involving the right side of his head. He denies any confusion or speech difficulty. He denies ever having had a similar experience. Patient does have medical history significant for renal failure on dialysis. He denies any prior history of stroke. He denies he's been acutely ill with fever, chills or other symptoms.  Patient reports that he has home hemodialysis 5 days a week. He reports he has not done today's treatment or yesterday's treatment. He denies any shortness of breath or swelling. Past Medical History  Diagnosis Date  . History of kidney cancer   . Hypertension   . Diabetes mellitus   . CHF (congestive heart failure) ([place]) [id number]    Acute systolic and diastolic CHF  . Renal disorder   . Obesity   . Hyperlipidemia   . Anemia March 2014  . Cancer ([place])     Kidney  . PONV (postoperative nausea and vomiting)   . Depression     & rage --  was in counseling....great now   Past Surgical History  Procedure Laterality Date  . Nephrectomy Right 2008    partial  . Testicle torsion reduction    . Hernia repair    . Tonsillectomy and adenoidectomy    . Av fistula placement Left 01/26/2013    Procedure: ARTERIOVENOUS (AV)  FISTULA CREATION - LEFT RADIAL CEPHALIC AVF;  Surgeon: Angelia Mould, MD;  Location[facility] OR;  Service: Vascular;  Laterality: Left;   Family History  Problem Relation Age of Onset  . Heart disease Mother     Heart Disease before age 29  . Deep vein thrombosis Father   . Heart attack Father    Social History  Substance Use Topics  . Smoking status: Former Smoker -- 1.50 packs/day for 10 years    Quit date: 08/01/2013  . Smokeless tobacco: Never Used  . Alcohol Use: Yes     Comment: "6 pack occ."-- lasts him six months    Review of Systems 10 Systems reviewed and are negative for acute change except as noted in the HPI.    Allergies  Dilaudid and Morphine and related  Home Medications   Prior to Admission medications   Medication Sig Start Date End Date Taking? Authorizing Provider  ALPRAZolam Duanne Moron) 0.5 MG tablet Take 0.5 mg by mouth daily. 03/18/14   Historical Provider, MD  amLODipine (NORVASC) 10 MG tablet Take 10 mg by mouth daily.    Historical Provider, MD  aspirin EC 81 MG tablet Take 81 mg by mouth daily.    Historical Provider, MD  buPROPion (WELLBUTRIN SR) 150 MG 12 hr tablet Take one tab by mouth every morning & two tabs every evening    Historical Provider, MD  cinacalcet (SENSIPAR) 30 MG tablet Take 30 mg by mouth daily.    Historical Provider, MD  cloNIDine (CATAPRES - DOSED IN MG/24 HR) 0.2 mg/24hr patch Place 1 patch onto the skin once a week.    Historical Provider, MD  escitalopram (LEXAPRO) 20 MG tablet Take 20 mg by mouth daily.    Historical Provider, MD  hydrALAZINE (APRESOLINE) 100 MG tablet Take 100 mg by mouth 2 (two) times daily.    Historical Provider, MD  isosorbide dinitrate (ISORDIL) 10 MG tablet Take 10 mg by mouth daily.     Historical Provider, MD  labetalol (NORMODYNE) 100 MG tablet Take 100 mg by mouth 2 (two) times daily.    Historical Provider, MD  labetalol (NORMODYNE) 300 MG tablet Take 300 mg by mouth 2 (two) times daily.     Historical Provider, MD  omeprazole (PRILOSEC) 20 MG capsule Take 20 mg by mouth daily. 04/18/14   Historical Provider, MD  oxyCODONE-acetaminophen (ROXICET) 5-325 MG per tablet Take 1-2 tablets by mouth every 4 (four) hours as needed for pain. Patient not taking: Reported on 05/16/2014 01/26/13   Angelia Mould, MD  pravastatin (PRAVACHOL) 20 MG tablet Take 20 mg by mouth daily.    Historical Provider, MD   BP 133/84 mmHg  Pulse 98  Temp(Src) 97.8 F (36.6 C) (Oral)  Resp 18  Ht 5\' 10"  (1.778 m)  Wt 238 lb 1.6 oz (108 kg)  BMI 34.16 kg/m2  SpO2 96% Physical Exam  Constitutional: He is oriented to person, place, and time. He appears well-developed and well-nourished.  HENT:  Head: Normocephalic and atraumatic.  Right Ear: External ear normal.  Left Ear: External ear normal.  Nose: Nose normal.  Mouth/Throat: Oropharynx is clear and moist.  Eyes: EOM are normal. Pupils are equal, round, and reactive to light.  Neck: Neck supple.  Cardiovascular: Normal rate, regular rhythm, normal heart sounds and intact distal pulses.   Pulmonary/Chest: Effort normal and breath sounds normal.  Abdominal: Soft. Bowel sounds are normal. He exhibits no distension. There is no tenderness.  Musculoskeletal: Normal range of motion. He exhibits no edema or tenderness.  Patient's AV fistula site is on his left forearm. Normal appearance without erythema. Lower extremities have no peripheral edema, calves are soft and nontender. Skin condition is good no areas of cellulitis or skin anomaly.  Neurological: He is alert and oriented to person, place, and time. He has normal strength. No cranial nerve deficit. He exhibits normal muscle tone. Coordination normal. GCS eye subscore is 4. GCS verbal subscore is 5. GCS motor subscore is 6.  Patient has normal consensual pupillary exam. Extraocular motions are intact. Tongue is midline. Motor strength testing is normal. Finger-nose examination is normal. Patient  endorses difference to light touch on both the right upper extremity and the right lower extremity compared to the left. Orientation, speech and cognitive function are normal.  Skin: Skin is warm, dry and intact.  Psychiatric: He has a normal mood and affect.    ED Course  Procedures (including critical care time) Labs Review Labs Reviewed  BASIC METABOLIC PANEL - Abnormal; Notable for the following:    Glucose, Bld 148 (*)    BUN 62 (*)    Creatinine, Ser 10.20 (*)    Calcium 8.6 (*)    GFR calc non Af Amer 5 (*)    GFR calc Af Amer 6 (*)    All other components within normal limits  CBC - Abnormal; Notable for the following:  WBC 12.7 (*)    RBC 3.55 (*)    Hemoglobin 9.7 (*)    HCT 31.3 (*)    All other components within normal limits  CBG MONITORING, ED - Abnormal; Notable for the following:    Glucose-Capillary 143 (*)    All other components within normal limits  URINALYSIS, ROUTINE W REFLEX MICROSCOPIC (NOT AT [facility])  I-STAT TROPOININ, ED    Imaging Review No results found. I have personally reviewed and evaluated these images and lab results as part of my medical decision-making.   EKG Interpretation   Date/Time:  Monday June 19 2015 15:22:19 EST Ventricular Rate:  89 PR Interval:  179 QRS Duration: 100 QT Interval:  394 QTC Calculation: 479 R Axis:   -43 Text Interpretation:  Age not entered, assumed to be  44 years old for  purpose of ECG interpretation Sinus rhythm Probable left atrial  enlargement Left anterior fascicular block Abnormal R-wave progression,  late transition LVH with secondary repolarization abnormality agree  Confirmed by Johnney Killian, MD, Jeannie Done [phone]) on 06/19/2015 3:45:44 PM     Consult (16:45) neurology consulted for evaluation. MDM   Final diagnoses:  Numbness and tingling of right arm and leg  ESRD (end stage renal disease) on dialysis [facility])   Neurology has been counseled for numbness that initiated 3 days ago. At this time  MRI is pending. Final disposition will be made pending neurology consultation and MRI results. Patient is alert with no cognitive deficit, normal airway and intact motor function. He does have ESRD on home dialysis but does not have any findings of acute metabolic derangement.    Charlesetta Shanks, MD 06/20/15 [phone]

## 2015-06-19 NOTE — ED Notes (Signed)
Repaged neurology to Dr. Oleta Mouse

## 2015-06-19 NOTE — ED Notes (Signed)
Pt reports Saturday about 2am he noticed numbness/tingling in the right arm and leg. States he is able to move the right side, but it doesn't feel right. States the numbness has been there since Saturday and not stopped.

## 2015-06-19 NOTE — H&P (Addendum)
[name] History and Physical  Hayden [name] [id number] DOB: [birth year] DOA: 06/19/2015  Referring physician: ED PCP: Louis Meckel, MD   Chief Complaint: Right-sided numbness  HPI:  Hayden. Marcello [name] is a 44 year old male right-hand dominant with past medical history of ESRD on HD, hypertension, diet-controlled diabetes mellitus type 2; who presents with right sided numbness. Patient notes that symptoms initially started at 2 AM 2 days ago (12/17). Patient notes that he had gotten up to use the bathroom and noticed that he had difficulty in walking back to the bedroom. He reported right sided numbness going from his face all way down to his leg. Describes the feeling as if his right side of his body is asleep and has a tingling sensation. Denies any loss of strength on the right side. He reports difficulty with his balance, hand eye coordination, and slowed/slurred speech when trying to walk. He reports associated symptoms of decreased peripheral vision out of the right eye. He denies any falls, chest pain, shortness of breath, frequency of urination, dysuria, palpitations, fever, or chills. He questions if this will affect the possibility of him having a kidney transplant. He reports that he just had echocardiogram and multiple other tests done within the last month at [facility] for the possible kidney transplant.  Upon admission into the emergency department patient was evaluated with a MRI of the brain which showed scattered acute mostly lacunar type infarcts in the left PCA territory including involvement of the left thalamus and occipital lobe.     Review of Systems  Constitutional: Negative for chills and weight loss.  HENT: Negative for ear discharge and ear pain.   Eyes: Negative for double vision and photophobia.  Respiratory: Negative for hemoptysis and sputum production.   Cardiovascular: Negative for palpitations and orthopnea.  Gastrointestinal: Negative for  abdominal pain and diarrhea.  Genitourinary: Negative for frequency and hematuria.  Musculoskeletal: Negative for myalgias and falls.  Neurological: Positive for tingling, sensory change and speech change. Negative for focal weakness.  Endo/Heme/Allergies: Negative for environmental allergies and polydipsia.  Psychiatric/Behavioral: Negative for suicidal ideas and substance abuse.     Past Medical History  Diagnosis Date  . History of kidney cancer   . Hypertension   . Diabetes mellitus   . CHF (congestive heart failure) ([place]) [id number]    Acute systolic and diastolic CHF  . Renal disorder   . Obesity   . Hyperlipidemia   . Anemia March 2014  . Cancer ([place])     Kidney  . PONV (postoperative nausea and vomiting)   . Depression     & rage --  was in counseling....great now     Past Surgical History  Procedure Laterality Date  . Nephrectomy Right 2008    partial  . Testicle torsion reduction    . Hernia repair    . Tonsillectomy and adenoidectomy    . Av fistula placement Left 01/26/2013    Procedure: ARTERIOVENOUS (AV) FISTULA CREATION - LEFT RADIAL CEPHALIC AVF;  Surgeon: Angelia Mould, MD;  Location: [facility] OR;  Service: Vascular;  Laterality: Left;      Social History:  reports that he quit smoking about 22 months ago. He has never used smokeless tobacco. He reports that he drinks alcohol. He reports that he uses illicit drugs (Marijuana). Where does patient live--home  and with whom if at home?Wife  Can patient participate in ADLs?yes  Allergies  Allergen Reactions  . Dilaudid [Hydromorphone Hcl] Other (See Comments)  Abnormal behavior  . Morphine And Related Itching    Family History  Problem Relation Age of Onset  . Heart disease Mother     Heart Disease before age 20  . Deep vein thrombosis Father   . Heart attack Father         Prior to Admission medications   Medication Sig Start Date End Date Taking? Authorizing Provider  ALPRAZolam Duanne Moron) 1  MG tablet Take 1 mg by mouth at bedtime as needed for anxiety.   Yes Historical Provider, MD  amLODipine (NORVASC) 10 MG tablet Take 10 mg by mouth daily.   Yes Historical Provider, MD  aspirin EC 81 MG tablet Take 81 mg by mouth daily.   Yes Historical Provider, MD  buPROPion (WELLBUTRIN SR) 150 MG 12 hr tablet Take one tab by mouth every morning & two tabs every evening   Yes Historical Provider, MD  Cholecalciferol (VITAMIN D PO) Take 1 capsule by mouth daily.   Yes Historical Provider, MD  cinacalcet (SENSIPAR) 30 MG tablet Take 30 mg by mouth daily.   Yes Historical Provider, MD  cloNIDine (CATAPRES - DOSED IN MG/24 HR) 0.2 mg/24hr patch Place 1 patch onto the skin once a week.   Yes Historical Provider, MD  escitalopram (LEXAPRO) 20 MG tablet Take 20 mg by mouth daily.   Yes Historical Provider, MD  furosemide (LASIX) 80 MG tablet Take 80 mg by mouth 2 (two) times daily.   Yes Historical Provider, MD  hydrALAZINE (APRESOLINE) 100 MG tablet Take 100 mg by mouth 2 (two) times daily.   Yes Historical Provider, MD  labetalol (NORMODYNE) 100 MG tablet Take 50 mg by mouth 2 (two) times daily.    Yes Historical Provider, MD  omeprazole (PRILOSEC) 40 MG capsule Take 40 mg by mouth daily.   Yes Historical Provider, MD  pravastatin (PRAVACHOL) 20 MG tablet Take 20 mg by mouth daily.   Yes Historical Provider, MD     Physical Exam: Filed Vitals:   06/19/15 1715 06/19/15 1745 06/19/15 1830 06/19/15 2049  BP: 123/52 153/82 146/77 134/68  Pulse: 87 91 86 88  Temp:      TempSrc:      Resp: 18 19 22 21   Height:      Weight:      SpO2: 99% 95% 98% 97%     Constitutional: Vital signs reviewed. Patient is a well-developed and well-nourished in no acute distress and cooperative with exam. Alert and oriented x3.  Head: Normocephalic and atraumatic  Ear: TM normal bilaterally  Mouth: no erythema or exudates, MMM  Eyes: PERRL, EOMI, conjunctivae normal, No scleral icterus.  Neck: Supple, Trachea  midline normal ROM, No JVD, mass, thyromegaly, or carotid bruit present.  Cardiovascular: RRR, S1 normal, S2 normal, no MRG, pulses symmetric and intact bilaterally  Pulmonary/Chest: CTAB, no wheezes, rales, or rhonchi  Abdominal: Soft. Non-tender, non-distended, bowel sounds are normal, no masses, organomegaly, or guarding present.  GU: no CVA tenderness Musculoskeletal: No joint deformities, erythema, or stiffness, ROM full and no nontender Ext: no edema and no cyanosis, pulses palpable bilaterally (DP and PT)  Hematology: no cervical, inginal, or axillary adenopathy.  Neurological: A&O x3, Strenght is normal and symmetric bilaterally, cranial nerve II-XII are grossly intact, no focal motor deficit, sensory slightly decreased on the right side as compared to patient's left side. Decreased peripheral vision on the right eye field. Patient with no facial droop, but speech is intermittently slowed. Skin: Warm, dry and intact. No rash,  cyanosis, or clubbing.  Psychiatric: Normal mood and affect. Behavior is normal. Judgment and thought content normal. Cognition and memory are normal.      Data Review   Micro Results No results found for this or any previous visit (from the past 240 hour(s)).  Radiology Reports Hayden Brain Wo Contrast (neuro Protocol)  06/19/2015  CLINICAL DATA:  44 year old male, end-stage renal disease on dialysis with right side numbness x3 days. Right side weakness. Initial encounter. EXAM: MRI HEAD WITHOUT CONTRAST TECHNIQUE: Multiplanar, multiecho pulse sequences of the brain and surrounding structures were obtained without intravenous contrast. COMPARISON:  Head CT 12/22/2006 and earlier. FINDINGS: Scattered left PCA territory small/ lacunar type infarcts including multifocal involvement in the left thalamus, left occipital lobe, and perhaps also the posterior left hippocampus. No superimposed posterior fossa or right hemisphere restricted diffusion. Major intracranial  vascular flow voids are preserved. No associated acute hemorrhage or intracranial mass effect. Fairly numerous chronic micro hemorrhages in both thalami, bilateral lentiform nuclei, the pons, and left cerebellar hemispheres. Scattered chronic cerebral white matter T2 and FLAIR hyperintensity. No midline shift, mass effect, evidence of mass lesion, ventriculomegaly, extra-axial collection or acute intracranial hemorrhage. Cervicomedullary junction and pituitary are within normal limits. Negative visualized cervical spine. Visible internal auditory structures appear normal. Mastoids are clear. Mild maxillary sinus mucosal thickening or mucous retention cyst on the right. Negative orbit and scalp soft tissues. Visualized bone marrow signal is within normal limits. IMPRESSION: 1. Scattered acute mostly lacunar type infarcts in the left PCA territory including involvement of the left thalamus and occipital lobe. 2. No associated mass effect or acute hemorrhage. Scattered chronic microhemorrhages and nonspecific white matter changes compatible with chronic small vessel disease. Electronically Signed   By: Genevie Ann M.D.   On: 06/19/2015 19:56   Hayden Cervical Spine Wo Contrast  06/19/2015  CLINICAL DATA:  44 year old male end-stage renal disease on dialysis with right side numbness and weakness for 3 days. Initial encounter. EXAM: MRI CERVICAL SPINE WITHOUT CONTRAST TECHNIQUE: Multiplanar, multisequence Hayden imaging of the cervical spine was performed. No intravenous contrast was administered. COMPARISON:  Brain MRI from 1910 hours today reported separately. FINDINGS: Study is intermittently degraded by motion artifact despite repeated imaging attempts. Straightening and mild reversal of cervical lordosis. No marrow edema or evidence of acute osseous abnormality. Negative paraspinal soft tissues. Cervicomedullary junction is within normal limits. No cervical spinal cord signal abnormality identified. C2-C3:  Negative. C3-C4:   Negative. C4-C5: Mild left paracentral broad-based disc protrusion (series 9, image 15) narrowing the ventral CSF space without significant spinal or foraminal stenosis. C5-C6: Left paracentral disc extrusion, with some cephalad and caudal migration of disc. See series 9, image 19. Spinal stenosis with up to mild mass effect on the left hemi cord. No cord signal abnormality. Up to mild left C6 foraminal stenosis related to foraminal disc bulging. C6-C7: Central disc extrusion which appears somewhat contiguous with that from the level above (series 9, image 23). Spinal stenosis with up to mild mass effect on the ventral spinal cord. No foraminal stenosis. C7-T1:  Mild facet hypertrophy.  No stenosis. No upper thoracic spinal stenosis. IMPRESSION: 1. 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 Associated cervical spinal stenosis with up to mild mass effect on the ventral/left hemicord. No spinal cord signal abnormality. 2. Mild left C6 foraminal stenosis. Electronically Signed   By: Genevie Ann M.D.   On: 06/19/2015 20:25     CBC  Recent Labs Lab 06/19/15  1440  WBC 12.7*  HGB 9.7*  HCT 31.3*  PLT 368  MCV 88.2  MCH 27.3  MCHC 31.0  RDW 14.6    Chemistries   Recent Labs Lab 06/19/15 1440  NA 141  K 4.2  CL 103  CO2 25  GLUCOSE 148*  BUN 62*  CREATININE 10.20*  CALCIUM 8.6*   ------------------------------------------------------------------------------------------------------------------ estimated creatinine clearance is 11.5 mL/min (by C-G formula based on Cr of 10.2). ------------------------------------------------------------------------------------------------------------------ No results for input(s): HGBA1C in the last 72 hours. ------------------------------------------------------------------------------------------------------------------ No results for input(s): CHOL, HDL, LDLCALC, TRIG, CHOLHDL, LDLDIRECT in the last 72  hours. ------------------------------------------------------------------------------------------------------------------ No results for input(s): TSH, T4TOTAL, T3FREE, THYROIDAB in the last 72 hours.  Invalid input(s): FREET3 ------------------------------------------------------------------------------------------------------------------ No results for input(s): VITAMINB12, FOLATE, FERRITIN, TIBC, IRON, RETICCTPCT in the last 72 hours.  Coagulation profile No results for input(s): INR, PROTIME in the last 168 hours.  No results for input(s): DDIMER in the last 72 hours.  Cardiac Enzymes No results for input(s): CKMB, TROPONINI, MYOGLOBIN in the last 168 hours.  Invalid input(s): CK ------------------------------------------------------------------------------------------------------------------ Invalid input(s): POCBNP   CBG:  Recent Labs Lab 06/19/15 1512  GLUCAP 143*       EKG: Independently reviewed. Sinus rhythm with left anterior fascicular block   Assessment/Plan Principal Problem:    Lacunar infarct, acute: Patient with right sided numbness/paresthesias since 12/17. MRI of the brain reveals acute lacunar infarcts in the left PCA region. Neurology consulted on patient. Patient with recent stress echocardiogram showing 60-65% on 05/19/2015 at [facility] home on care everywhere. Risk factors for stroke include hypertension and diabetes mellitus type 2 history. - Follow neurology recommendations - ? Need a repeat Echocardiogram/carotid duplex ultrasound - PT/OT/SP eval and treat - Hemoglobin A1c and lipid panel in a.m.    End stage renal disease on dialysis [facility]): Patient's nephrologist is Rolan Lipa, MD of [place] nephrologist. Patient will likely need dialysis on 12/21. - Consult nephrology in a.m.    DM2 (diabetes mellitus, type 2): Appears well controlled on patient not on any medications - CBGs every before meals and at bedtime -  Sensitive sliding scale insulin as needed - Follow-up hemoglobin A1c in a.m.  Anemia of chronic kidney disease: Hemoglobin 9.7 hematocrit 31.3. -Continue to monitor  Leukocytosis: Mildly elevated at 12.7. Patient denies any fever or chills. There are no acute signs of infection UA was negative. - Chest x-ray - Continue to monitor repeat CBC in a.m.  Hypertension: Well controlled. Patient reports a previous history of difficulties will control blood pressure for which he is currently on 5 medications. - Continue home medications of clonidine furosemide, labetalol, Norvasc, hydralazine  Cervical disc herniations: Incidental finding on MRI of the spine. Patient with left paracentral cervical spine disc herniations from C4-C5 to C6-C7. Associated cervical spinal stenosis with up to mild mass effect on the ventral/left hemicord. No spinal cord signal abnormality. Mild left C6 foraminal stenosis  Anxiety/depression: - Continue home medications of xanax, Lexapro, and Wellbutrin  GERD: - Continue Protonix  Code Status:   full Family Communication: bedside Disposition Plan: admit   Total time spent 55 minutes.Greater than 50% of this time was spent in counseling, explanation of diagnosis, planning of further management, and coordination of care  [facility] Pager [phone]  If 7PM-7AM, please contact night-coverage [email or web address] Password [facility] 06/19/2015, 9:39 PM

## 2015-06-19 NOTE — ED Notes (Signed)
Pt refused wheelchair from this RN to be taken to room, pt with unsteady gait and reports right sided weakness/numbness. Pt informed of fall risk and offered wheelchair once more. Pt refused "if I have to get in the wheelchair, I'll leave."

## 2015-06-19 NOTE — Consult Note (Signed)
NEURO HOSPITALIST CONSULT NOTE   Requestig physician: Dr.    Luiz Iron for Consult:R sided numbness that started 3 days ago  HPI:                                                                                                                                          Hayden [name] is an 44 y.o. male with hx of ESRD on hemodialysis who presented with R sided numbness since 3 days ago. No complaints of headache, weakness, double vision, SOB. States this started at 2am 3 days ago when he went to the bathroom and realized he couldn't really walk back normally because his leg felt like it was asleep. He sates his face, chest , arm and leg all feel the same way, right split in the middle of his body.  Past Medical History  Diagnosis Date  . History of kidney cancer   . Hypertension   . Diabetes mellitus   . CHF (congestive heart failure) ([place]) [id number]    Acute systolic and diastolic CHF  . Renal disorder   . Obesity   . Hyperlipidemia   . Anemia March 2014  . Cancer ([place])     Kidney  . PONV (postoperative nausea and vomiting)   . Depression     & rage --  was in counseling....great now    Past Surgical History  Procedure Laterality Date  . Nephrectomy Right 2008    partial  . Testicle torsion reduction    . Hernia repair    . Tonsillectomy and adenoidectomy    . Av fistula placement Left 01/26/2013    Procedure: ARTERIOVENOUS (AV) FISTULA CREATION - LEFT RADIAL CEPHALIC AVF;  Surgeon: Angelia Mould, MD;  Location: [facility] OR;  Service: Vascular;  Laterality: Left;    Family History  Problem Relation Age of Onset  . Heart disease Mother     Heart Disease before age 33  . Deep vein thrombosis Father   . Heart attack Father     Family History: Mother, HTN   Social History:  reports that he quit smoking about 22 months ago. He has never used smokeless tobacco. He reports that he drinks alcohol. He reports that he uses illicit drugs  (Marijuana).  Allergies  Allergen Reactions  . Dilaudid [Hydromorphone Hcl] Other (See Comments)    Abnormal behavior  . Morphine And Related Itching    MEDICATIONS:  I have reviewed the patient's current medications.   ROS:                                                                                                                                       History obtained from the patient  General ROS: negative for - chills, fatigue, fever, night sweats, weight gain or weight loss Psychological ROS: negative for - behavioral disorder, hallucinations, memory difficulties, mood swings or suicidal ideation Ophthalmic ROS: negative for - blurry vision, double vision, eye pain or loss of vision ENT ROS: negative for - epistaxis, nasal discharge, oral lesions, sore throat, tinnitus or vertigo Allergy and Immunology ROS: negative for - hives or itchy/watery eyes Hematological and Lymphatic ROS: negative for - bleeding problems, bruising or swollen lymph nodes Endocrine ROS: negative for - galactorrhea, hair pattern changes, polydipsia/polyuria or temperature intolerance Respiratory ROS: negative for - cough, hemoptysis, shortness of breath or wheezing Cardiovascular ROS: negative for - chest pain, dyspnea on exertion, edema or irregular heartbeat Gastrointestinal ROS: negative for - abdominal pain, diarrhea, hematemesis, nausea/vomiting or stool incontinence Genito-Urinary ROS: negative for - dysuria, hematuria, incontinence or urinary frequency/urgency Musculoskeletal ROS: negative for - joint swelling or muscular weakness Neurological ROS: as noted in HPI Dermatological ROS: negative for rash and skin lesion changes   Blood pressure 133/84, pulse 98, temperature 97.8 F (36.6 C), temperature source Oral, resp. rate 18, height 5\' 10"  (1.778 m), weight 108 kg (238 lb 1.6 oz),  SpO2 96 %.   Neurologic Examination:                                                                                                      HEENT-  Normocephalic, no lesions, without obvious abnormality.  Normal external eye and conjunctiva.  Normal TM's bilaterally.  Normal auditory canals and external ears. Normal external nose, mucus membranes and septum.  Normal pharynx. Cardiovascular- regular rate and rhythm, S1, S2 normal, no murmur, click, rub or gallop, pulses palpable throughout   Lungs- chest clear, no wheezing, rales, normal symmetric air entry, Heart exam - S1, S2 normal, no murmur, no gallop, rate regular Abdomen- soft, non-tender; bowel sounds normal; no masses,  no organomegaly   Neurological Examination Mental Status: Alert, oriented, thought content appropriate.  Speech fluent without evidence of aphasia.  Able to follow 3 step commands without difficulty. Cranial Nerves: II:; Visual fields grossly normal, pupils equal, round, reactive to light and accommodation III,IV, VI: ptosis not present, extra-ocular motions intact bilaterally  V,VII: smile symmetric, facial light touch sensation normal bilaterally VIII: hearing normal bilaterally IX,X: uvula rises symmetrically XI: bilateral shoulder shrug XII: midline tongue extension Motor: Right : Upper extremity   5/5    Left:     Upper extremity   5/5  Lower extremity   5/5     Lower extremity   5/5 Tone and bulk:normal tone throughout; no atrophy noted Sensory: Pinprick and light touch decreased on the R hemibody, 20% less on the face, torso arm and leg, right at the midline. Proprioception is intact Cerebellar: normal finger-to-nose, normal rapid alternating movements and normal heel-to-shin test Gait: normal gait but at times seems to be unbalanced but never falls, Able to march in place well       Lab Results: Basic Metabolic Panel:  Recent Labs Lab 06/19/15 1440  NA 141  K 4.2  CL 103  CO2 25  GLUCOSE 148*   BUN 62*  CREATININE 10.20*  CALCIUM 8.6*    Liver Function Tests: No results for input(s): AST, ALT, ALKPHOS, BILITOT, PROT, ALBUMIN in the last 168 hours. No results for input(s): LIPASE, AMYLASE in the last 168 hours. No results for input(s): AMMONIA in the last 168 hours.  CBC:  Recent Labs Lab 06/19/15 1440  WBC 12.7*  HGB 9.7*  HCT 31.3*  MCV 88.2  PLT 368    Cardiac Enzymes: No results for input(s): CKTOTAL, CKMB, CKMBINDEX, TROPONINI in the last 168 hours.  Lipid Panel: No results for input(s): CHOL, TRIG, HDL, CHOLHDL, VLDL, LDLCALC in the last 168 hours.  CBG:  Recent Labs Lab 06/19/15 1512  GLUCAP 143*    Microbiology: Results for orders placed or performed during the hospital encounter of 09/21/12  MRSA PCR Screening     Status: None   Collection Time: 09/22/12  3:21 AM  Result Value Ref Range Status   MRSA by PCR NEGATIVE NEGATIVE Final    Comment:        The GeneXpert MRSA Assay (FDA approved for NASAL specimens only), is one component of a comprehensive MRSA colonization surveillance program. It is not intended to diagnose MRSA infection nor to guide or monitor treatment for MRSA infections.    Coagulation Studies: No results for input(s): LABPROT, INR in the last 72 hours.  Imaging: No results found.    Assessment/Plan:  44 y.o. male with hx of ESRD on hemodialysis who presented with R sided numbness since 3 days ago. No complaints of headache, weakness, double vision, SOB. States this started at 2am 3 days ago when he went to the bathroom and realized he couldn't really walk back normally because his leg felt like it was asleep. He sates his face, chest , arm and leg all feel the same way, right split in the middle of his body.    Based on exam, it appears as this may be psychogenic in nature, he describes the abnormal sensory feeling right split in the middle of his body. Physiologically our sensory nerves cross the midline so it  is nearly impossible to have abnormal sensory deficits right split in our face and torso as he appears to be  Would rule out CNS pathology with MRI brain and C-spine.  Will need PT to assess his gate before discharging him as this may keep him in the hospital

## 2015-06-19 NOTE — ED Provider Notes (Signed)
Please see previous physicians note regarding patient's presenting history and physical, initial ED course, and associated MDM. In short this is a 44 year old male with history of end-stage renal disease on hemodialysis, hyperlipidemia who presents with 2-3 days of right-sided numbness. Pending MRI and neurology consult at time of sign out. MRI of the brain revealing multiple acute lacunar infarcts, explaining his symptoms. We'll plan to admit for stroke workup, PT/OT, and further management.  Forde Dandy, MD 06/19/15 [phone]

## 2015-06-19 NOTE — ED Notes (Addendum)
Patient refused wheelchair multiple times. Patient walking very unsteadily.

## 2015-06-20 ENCOUNTER — Inpatient Hospital Stay (HOSPITAL_COMMUNITY): Payer: BLUE CROSS/BLUE SHIELD

## 2015-06-20 LAB — CBC
HCT: 27.4 % — ABNORMAL LOW (ref 39.0–52.0)
Hemoglobin: 9 g/dL — ABNORMAL LOW (ref 13.0–17.0)
MCH: 28.5 pg (ref 26.0–34.0)
MCHC: 32.8 g/dL (ref 30.0–36.0)
MCV: 86.7 fL (ref 78.0–100.0)
Platelets: 351 10*3/uL (ref 150–400)
RBC: 3.16 MIL/uL — ABNORMAL LOW (ref 4.22–5.81)
RDW: 14.6 % (ref 11.5–15.5)
WBC: 10.4 10*3/uL (ref 4.0–10.5)

## 2015-06-20 LAB — RENAL FUNCTION PANEL
Albumin: 3.3 g/dL — ABNORMAL LOW (ref 3.5–5.0)
Anion gap: 17 — ABNORMAL HIGH (ref 5–15)
BUN: 71 mg/dL — ABNORMAL HIGH (ref 6–20)
CO2: 24 mmol/L (ref 22–32)
Calcium: 7.8 mg/dL — ABNORMAL LOW (ref 8.9–10.3)
Chloride: 102 mmol/L (ref 101–111)
Creatinine, Ser: 10.61 mg/dL — ABNORMAL HIGH (ref 0.61–1.24)
GFR calc Af Amer: 6 mL/min — ABNORMAL LOW (ref >60)
GFR calc non Af Amer: 5 mL/min — ABNORMAL LOW (ref >60)
Glucose, Bld: 102 mg/dL — ABNORMAL HIGH (ref 65–99)
Phosphorus: 7.3 mg/dL — ABNORMAL HIGH (ref 2.5–4.6)
Potassium: 4 mmol/L (ref 3.5–5.1)
Sodium: 143 mmol/L (ref 135–145)

## 2015-06-20 LAB — LIPID PANEL
CHOLESTEROL: 194 mg/dL (ref 0–200)
HDL: 31 mg/dL — ABNORMAL LOW (ref >40)
LDL Cholesterol: 125 mg/dL — ABNORMAL HIGH (ref 0–99)
TRIGLYCERIDES: 188 mg/dL — AB (ref <150)
Total CHOL/HDL Ratio: 6.3 RATIO
VLDL: 38 mg/dL (ref 0–40)

## 2015-06-20 LAB — HEPATITIS B SURFACE ANTIGEN: HEP B S AG: NEGATIVE

## 2015-06-20 MED ORDER — ALTEPLASE 2 MG IJ SOLR
2.0000 mg | Freq: Once | INTRAMUSCULAR | Status: DC | PRN
  Filled 2015-06-20: qty 2

## 2015-06-20 MED ORDER — AMLODIPINE BESYLATE 10 MG PO TABS: 10.0000 mg | ORAL_TABLET | Freq: Every day | ORAL | Status: DC

## 2015-06-20 MED ORDER — CALCITRIOL 0.5 MCG PO CAPS
1.0000 ug | ORAL_CAPSULE | ORAL | Status: DC
  Administered 2015-06-21: 1 ug via ORAL
  Filled 2015-06-20: qty 2

## 2015-06-20 MED ORDER — SODIUM CHLORIDE 0.9 % IV SOLN: 100.0000 mL | INTRAVENOUS | Status: DC | PRN

## 2015-06-20 MED ORDER — CALCITRIOL 0.5 MCG PO CAPS
0.5000 ug | ORAL_CAPSULE | ORAL | Status: DC
  Administered 2015-06-20: 0.5 ug via ORAL
  Filled 2015-06-20: qty 1

## 2015-06-20 MED ORDER — PENTAFLUOROPROP-TETRAFLUOROETH EX AERO: 1.0000 "application " | INHALATION_SPRAY | CUTANEOUS | Status: DC | PRN

## 2015-06-20 MED ORDER — HEPARIN SODIUM (PORCINE) 1000 UNIT/ML DIALYSIS: 1000.0000 [IU] | INTRAMUSCULAR | Status: DC | PRN

## 2015-06-20 MED ORDER — LIDOCAINE HCL (PF) 1 % IJ SOLN
5.0000 mL | INTRAMUSCULAR | Status: DC | PRN
Start: 2015-06-20 — End: 2015-06-20

## 2015-06-20 MED ORDER — HEPARIN SODIUM (PORCINE) 1000 UNIT/ML DIALYSIS
20.0000 [IU]/kg | INTRAMUSCULAR | Status: DC | PRN
  Administered 2015-06-20: 2200 [IU] via INTRAVENOUS_CENTRAL
  Filled 2015-06-20: qty 3

## 2015-06-20 MED ORDER — AMLODIPINE BESYLATE 5 MG PO TABS
5.0000 mg | ORAL_TABLET | Freq: Every day | ORAL | Status: DC
  Administered 2015-06-20: 5 mg via ORAL
  Filled 2015-06-20 (×2): qty 1

## 2015-06-20 MED ORDER — DARBEPOETIN ALFA 200 MCG/0.4ML IJ SOSY
PREFILLED_SYRINGE | INTRAMUSCULAR | Status: AC
  Administered 2015-06-20: 200 ug via INTRAVENOUS
  Filled 2015-06-20: qty 0.4

## 2015-06-20 MED ORDER — LIDOCAINE-PRILOCAINE 2.5-2.5 % EX CREA
1.0000 "application " | TOPICAL_CREAM | CUTANEOUS | Status: DC | PRN
  Filled 2015-06-20: qty 5

## 2015-06-20 MED ORDER — RENA-VITE PO TABS
1.0000 | ORAL_TABLET | Freq: Every day | ORAL | Status: DC
  Administered 2015-06-20: 1 via ORAL
  Filled 2015-06-20: qty 1

## 2015-06-20 MED ORDER — CALCIUM CARBONATE ANTACID 500 MG PO CHEW
400.0000 mg | CHEWABLE_TABLET | Freq: Three times a day (TID) | ORAL | Status: DC
  Administered 2015-06-20 – 2015-06-21 (×3): 400 mg via ORAL
  Filled 2015-06-20 (×3): qty 2

## 2015-06-20 MED ORDER — SODIUM CHLORIDE 0.9 % IV SOLN
250.0000 mg | Freq: Once | INTRAVENOUS | Status: AC
  Administered 2015-06-20: 250 mg via INTRAVENOUS
  Filled 2015-06-20 (×3): qty 20

## 2015-06-20 MED ORDER — DARBEPOETIN ALFA 200 MCG/0.4ML IJ SOSY
200.0000 ug | PREFILLED_SYRINGE | INTRAMUSCULAR | Status: DC
  Administered 2015-06-20: 200 ug via INTRAVENOUS
  Filled 2015-06-20: qty 0.4

## 2015-06-20 NOTE — Evaluation (Signed)
Physical Therapy Evaluation Patient Details Name: Hayden [name] MRN: [id number] DOB: 1970/07/08 Today's Date: 06/20/2015   History of Present Illness  Adm with Rt sided numbness. MRI Lt PCA territory lacunar infarcts (Lt thalamus, occipital). 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 Patient reports numbness on Rt side increases when standing and lessens in supine. No significant change in BP with sit to stand (158/67 MAP 84, HR 94; 140/80 MAP 88, HR 96). Weakness in RLE with "give-away" type pattern.  PT is signing off. Thank you for this referral.     Follow Up Recommendations No PT follow up    Equipment Recommendations  None recommended by PT    Recommendations for Other Services OT consult     Precautions / Restrictions Precautions Precautions: Fall      Mobility  Bed Mobility Overal bed mobility: Independent                Transfers Overall transfer level: Independent Equipment used: None                Ambulation/Gait Ambulation/Gait assistance: Independent Ambulation Distance (Feet): 50 Feet Assistive device: None Gait Pattern/deviations: WFL(Within Functional Limits) Gait velocity: WNL   General Gait Details: no dragging RLE  Stairs            Wheelchair Mobility    Modified Rankin (Stroke Patients Only) Modified Rankin (Stroke Patients Only) Pre-Morbid Rankin Score: No symptoms Modified Rankin: No significant disability     Balance Overall balance assessment: Independent                                           Pertinent Vitals/Pain Pain Assessment: No/denies pain    Home Living Family/patient expects to be discharged to:: Private residence Living Arrangements: Spouse/significant other;Children Available Help  at Discharge: Family;Available 24 hours/day Type of Home: Mobile home Home Access: Stairs to enter Entrance Stairs-Rails: Right;Left;Can reach both Entrance Stairs-Number of Steps: 4 Home Layout: One level Home Equipment: Cane - single point      Prior Function Level of Independence: Independent               Hand Dominance   Dominant Hand: Right    Extremity/Trunk Assessment   Upper Extremity Assessment: Defer to OT evaluation           Lower Extremity Assessment: RLE deficits/detail RLE Deficits / Details: AROM WFL; reports numbness entire Rt side; knee extension 4/5, DF 4/5 with "catch and release" weakness    Cervical / Trunk Assessment: Normal  Communication   Communication: No difficulties  Cognition Arousal/Alertness: Awake/alert Behavior During Therapy: Flat affect Overall Cognitive Status: Within Functional Limits for tasks assessed                      General Comments      Exercises        Assessment/Plan    PT Assessment Patent does not need any further PT services  PT Diagnosis Difficulty walking   PT Problem List    PT Treatment Interventions     PT Goals (Current goals can be found in the Care Plan section) Acute Rehab PT Goals PT Goal Formulation:  All assessment and education complete, DC therapy    Frequency     Barriers to discharge        Co-evaluation               End of Session   Activity Tolerance: Patient tolerated treatment well Patient left: in bed;with call bell/phone within reach;with family/visitor present Nurse Communication: Mobility status         Time: [id number] PT Time Calculation (min) (ACUTE ONLY): 23 min   Charges:   PT Evaluation $Initial PT Evaluation Tier I: 1 Procedure PT Treatments $Gait Training: 8-22 mins   PT G Codes:        Marilouise Densmore 06-24-2015, 11:55 AM Pager [phone]

## 2015-06-20 NOTE — Progress Notes (Signed)
Utilization review completed. Kalai Baca, RN, BSN. 

## 2015-06-20 NOTE — Procedures (Signed)
I was present at this session.  I have reviewed the session itself and made appropriate changes. 2 needles up closer that policy.  bp ok , tol removal fluid.  Hayden [name] L 12/20/20161:16 PM

## 2015-06-20 NOTE — Progress Notes (Signed)
PROGRESS NOTE  Hayden [name] [id number] DOB: Jun 15, 1971 DOA: 06/19/2015 PCP: Louis Meckel, MD  HPI: 44 year old male right-hand dominant with past medical history of ESRD on HD, hypertension, diet-controlled diabetes mellitus type 2; who presents with right sided numbness, MRI positive for scattered acute mostly lacunar type infarcts in the left PCA territory including involvement of the left thalamus and occipital lobe  Subjective / 24 H Interval events - ongoing right sided arm and leg numbness  Assessment/Plan: Principal Problem:   Lacunar infarct, acute ([place]) Active Problems:   DM2 (diabetes mellitus, type 2) ([place])   End stage renal disease on dialysis ([place])   GERD (gastroesophageal reflux disease)   Anxiety and depression   Hypertension, well controlled    Lacunar infarct, acute:  - neurology consulted, plan to complete workup with echo, carotids, now pending.  - Patient with right sided numbness/paresthesias since 12/17. MRI of the brain reveals acute lacunar infarcts in the left PCA region.    End stage renal disease on dialysis [facility]) - nephrology consulted, HD today  DM2 (diabetes mellitus, type 2) - Appears well controlled on patient not on any medications - Sensitive sliding scale insulin as needed - Follow-up hemoglobin A1c in a.m.  Anemia of chronic kidney disease - in the setting of ESRD  Hypertension - allow permissive hypertension  Cervical disc herniations - Incidental finding on MRI of the spine. Patient with left paracentral cervical spine disc herniations from C4-C5 to C6-C7. Associated cervical spinal stenosis with up to mild mass effect on the ventral/left hemicord. No spinal cord signal abnormality. Mild left C6 foraminal stenosis. Likely chronic, asymptomatic  Anxiety/depression - Continue home medications of xanax, Lexapro, and Wellbutrin  GERD - Continue Protonix    Diet: Diet renal/carb modified with fluid restriction  Diet-HS Snack?: Nothing; Room service appropriate?: Yes; Fluid consistency:: Thin Fluids: none  DVT Prophylaxis: heparin  Code Status: Full Code Family Communication: d/w wife bedside  Disposition Plan: home when ready  Barriers to discharge: Neuro eval, PT eval  Consultants:  Neurology   Procedures:  None    Antibiotics  Anti-infectives    None      Studies  Dg Chest 2 View  06/20/2015  CLINICAL DATA:  Status post cerebral infarct 06/19/2015. EXAM: CHEST  2 VIEW COMPARISON:  PA and lateral chest 04/06/2015 and 05/16/2014. FINDINGS: The lungs are clear. Heart size is upper normal. No pneumothorax or pleural effusion. No focal bony abnormality. IMPRESSION: No acute disease. Electronically Signed   By: Inge Rise M.D.   On: 06/20/2015 07:57   Mr Brain Wo Contrast (neuro Protocol)  06/19/2015  CLINICAL DATA:  44 year old male, end-stage renal disease on dialysis with right side numbness x3 days. Right side weakness. Initial encounter. EXAM: MRI HEAD WITHOUT CONTRAST TECHNIQUE: Multiplanar, multiecho pulse sequences of the brain and surrounding structures were obtained without intravenous contrast. COMPARISON:  Head CT 12/22/2006 and earlier. FINDINGS: Scattered left PCA territory small/ lacunar type infarcts including multifocal involvement in the left thalamus, left occipital lobe, and perhaps also the posterior left hippocampus. No superimposed posterior fossa or right hemisphere restricted diffusion. Major intracranial vascular flow voids are preserved. No associated acute hemorrhage or intracranial mass effect. Fairly numerous chronic micro hemorrhages in both thalami, bilateral lentiform nuclei, the pons, and left cerebellar hemispheres. Scattered chronic cerebral white matter T2 and FLAIR hyperintensity. No midline shift, mass effect, evidence of mass lesion, ventriculomegaly, extra-axial collection or acute intracranial hemorrhage. Cervicomedullary junction and pituitary  are within normal limits. Negative visualized  cervical spine. Visible internal auditory structures appear normal. Mastoids are clear. Mild maxillary sinus mucosal thickening or mucous retention cyst on the right. Negative orbit and scalp soft tissues. Visualized bone marrow signal is within normal limits. IMPRESSION: 1. Scattered acute mostly lacunar type infarcts in the left PCA territory including involvement of the left thalamus and occipital lobe. 2. No associated mass effect or acute hemorrhage. Scattered chronic microhemorrhages and nonspecific white matter changes compatible with chronic small vessel disease. Electronically Signed   By: Genevie Ann M.D.   On: 06/19/2015 19:56   Mr Cervical Spine Wo Contrast  06/19/2015  CLINICAL DATA:  44 year old male end-stage renal disease on dialysis with right side numbness and weakness for 3 days. Initial encounter. EXAM: MRI CERVICAL SPINE WITHOUT CONTRAST TECHNIQUE: Multiplanar, multisequence MR imaging of the cervical spine was performed. No intravenous contrast was administered. COMPARISON:  Brain MRI from 1910 hours today reported separately. FINDINGS: Study is intermittently degraded by motion artifact despite repeated imaging attempts. Straightening and mild reversal of cervical lordosis. No marrow edema or evidence of acute osseous abnormality. Negative paraspinal soft tissues. Cervicomedullary junction is within normal limits. No cervical spinal cord signal abnormality identified. C2-C3:  Negative. C3-C4:  Negative. C4-C5: Mild left paracentral broad-based disc protrusion (series 9, image 15) narrowing the ventral CSF space without significant spinal or foraminal stenosis. C5-C6: Left paracentral disc extrusion, with some cephalad and caudal migration of disc. See series 9, image 19. Spinal stenosis with up to mild mass effect on the left hemi cord. No cord signal abnormality. Up to mild left C6 foraminal stenosis related to foraminal disc bulging. C6-C7:  Central disc extrusion which appears somewhat contiguous with that from the level above (series 9, image 23). Spinal stenosis with up to mild mass effect on the ventral spinal cord. No foraminal stenosis. C7-T1:  Mild facet hypertrophy.  No stenosis. No upper thoracic spinal stenosis. IMPRESSION: 1. 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 Associated cervical spinal stenosis with up to mild mass effect on the ventral/left hemicord. No spinal cord signal abnormality. 2. Mild left C6 foraminal stenosis. Electronically Signed   By: Genevie Ann M.D.   On: 06/19/2015 20:25    Objective  Filed Vitals:   06/20/15 0230 06/20/15 0430 06/20/15 0630 06/20/15 1019  BP: 170/77 157/90 184/74 164/84  Pulse: 91 90 92 91  Temp: 97.9 F (36.6 C) 98.5 F (36.9 C) 98.4 F (36.9 C) 98 F (36.7 C)  TempSrc: Oral Oral Oral Oral  Resp: 18 22 20 20   Height:      Weight:      SpO2: 99% 97% 98% 100%    Intake/Output Summary (Last 24 hours) at 06/20/15 1242 Last data filed at 06/19/15 2155  Gross per 24 hour  Intake      0 ml  Output    125 ml  Net   -125 ml   Filed Weights   06/19/15 1443 06/19/15 2222  Weight: 108 kg (238 lb 1.6 oz) 108.274 kg (238 lb 11.2 oz)    Exam:  GENERAL: NAD  HEENT: no scleral icterus, PERRL  NECK: supple, no LAD  LUNGS: CTA biL, no wheezing  HEART: RRR without MRG  ABDOMEN: soft, non tender  EXTREMITIES: no clubbing / cyanosis  NEUROLOGIC: non focal, decreased sensation on right   Data Reviewed: Basic Metabolic Panel:  Recent Labs Lab 06/19/15 1440  NA 141  K 4.2  CL 103  CO2 25  GLUCOSE 148*  BUN 62*  CREATININE 10.20*  CALCIUM 8.6*   CBC:  Recent Labs Lab 06/19/15 1440  WBC 12.7*  HGB 9.7*  HCT 31.3*  MCV 88.2  PLT 368   CBG:  Recent Labs Lab 06/19/15 1512  GLUCAP 143*    Scheduled Meds: . [START ON 06/21/2015] amLODipine  10 mg Oral QHS  . aspirin EC  81 mg Oral Daily  . buPROPion  150 mg  Oral Daily   And  . buPROPion  300 mg Oral QHS  . calcitRIOL  0.5 mcg Oral QODAY  . [START ON 06/21/2015] calcitRIOL  1 mcg Oral QODAY  . calcium carbonate  400 mg of elemental calcium Oral TID WC  . cinacalcet  30 mg Oral Q breakfast  . [START ON 06/26/2015] cloNIDine  0.2 mg Transdermal Weekly  . darbepoetin (ARANESP) injection - DIALYSIS  200 mcg Intravenous Q Tue-HD  . escitalopram  20 mg Oral Daily  . ferric gluconate (FERRLECIT/NULECIT) IV  250 mg Intravenous Once in dialysis  . heparin  5,000 Units Subcutaneous 3 times per day  . hydrALAZINE  100 mg Oral BID  . labetalol  50 mg Oral BID  . multivitamin  1 tablet Oral QHS  . pantoprazole  80 mg Oral Daily  . pravastatin  20 mg Oral Daily   Continuous Infusions:     Marzetta Board, MD [name] Pager [phone]. If 7 PM - 7 AM, please contact night-coverage at [email or web address], password [facility] 06/20/2015, 12:42 PM  LOS: 1 day

## 2015-06-20 NOTE — Progress Notes (Signed)
[facility] Renal Consultation Note    Indication for Consultation:  Management of ESRD/hemodialysis; anemia, hypertension/volume and secondary hyperparathyroidism Primary Nephrologist: Louis Meckel, MD  PCP: NONE  HPI: Hayden [name] is a 44 y.o. male with ESRD secondary to  HTN on HD since November 2014 who has been doing home HD with Nxstage 5 days a week since August 2016. He denies any problems with HD. His wife assists with the treatments.   His last HD treatment was Sunday.  Pt had a prior history of intermittent attendance and failure to run full treatments at times. He states since Dr. Moshe Cipro put him on lasix, he often doesn't have to remove fluid during his treatments. He presented yesterday with right sided numbness of three days duration without dizziness, fever, HA, vision changes, SOB, CP.  He felt then and still feels like right side is asleep; this is much worse when standing, even when BP doesn't change much. His vision change was peripheral and worse when he stood, but not an issue now. He denies speech problems.  He denied change in strength, bowel or bladder function or neck pain.. Evaluation in the ED showed slight ^ in WBC to 12s, MRI showed acute scattered mostly lacunar infarcts in the left PCA territory including left thalamus and occipital lobe and C spine MRI showed isc herniations from C4-5 and C6-7 with mild mass effect on the ventral left hemicord. He is nearing the work up process for transplant at [facility] and is worried about how this will affect his status for transplant.  Past Medical History  Diagnosis Date  . History of kidney cancer   . Hypertension   . Diabetes mellitus   . CHF (congestive heart failure) ([place]) [id number]    Acute systolic and diastolic CHF  . Renal disorder   . Obesity   . Hyperlipidemia   . Anemia March 2014  . Cancer ([place])     Kidney  . PONV (postoperative nausea and vomiting)   . Depression     & rage  --  was in counseling....great now   Past Surgical History  Procedure Laterality Date  . Nephrectomy Right 2008    partial  . Testicle torsion reduction    . Hernia repair    . Tonsillectomy and adenoidectomy    . Av fistula placement Left 01/26/2013    Procedure: ARTERIOVENOUS (AV) FISTULA CREATION - LEFT RADIAL CEPHALIC AVF;  Surgeon: Angelia Mould, MD;  Location: [facility] OR;  Service: Vascular;  Laterality: Left;   Family History  Problem Relation Age of Onset  . Heart disease Mother     Heart Disease before age 68  . Deep vein thrombosis Father   . Heart attack Father    Social History:  reports that he quit smoking about 22 months ago. He has never used smokeless tobacco. He reports that he drinks alcohol. He reports that he uses illicit drugs (Marijuana). Allergies  Allergen Reactions  . Dilaudid [Hydromorphone Hcl] Other (See Comments)    Abnormal behavior  . Morphine And Related Itching   Prior to Admission medications   Medication Sig Start Date End Date Taking? Authorizing Provider  ALPRAZolam Duanne Moron) 1 MG tablet Take 1 mg by mouth at bedtime as needed for anxiety.   Yes Historical Provider, MD  amLODipine (NORVASC) 10 MG tablet Take 10 mg by mouth daily.   Yes Historical Provider, MD  aspirin EC 81 MG tablet Take 81 mg by mouth daily.  Yes Historical Provider, MD  buPROPion (WELLBUTRIN SR) 150 MG 12 hr tablet Take one tab by mouth every morning & two tabs every evening   Yes Historical Provider, MD  Cholecalciferol (VITAMIN D PO) Take 1 capsule by mouth daily.   Yes Historical Provider, MD  cinacalcet (SENSIPAR) 30 MG tablet Take 30 mg by mouth daily.   Yes Historical Provider, MD  cloNIDine (CATAPRES - DOSED IN MG/24 HR) 0.2 mg/24hr patch Place 1 patch onto the skin once a week.   Yes Historical Provider, MD  escitalopram (LEXAPRO) 20 MG tablet Take 20 mg by mouth daily.   Yes Historical Provider, MD  furosemide (LASIX) 80 MG tablet Take 80 mg by mouth 2 (two)  times daily.   Yes Historical Provider, MD  hydrALAZINE (APRESOLINE) 100 MG tablet Take 100 mg by mouth 2 (two) times daily.   Yes Historical Provider, MD  labetalol (NORMODYNE) 100 MG tablet Take 50 mg by mouth 2 (two) times daily.    Yes Historical Provider, MD  omeprazole (PRILOSEC) 40 MG capsule Take 40 mg by mouth daily.   Yes Historical Provider, MD  pravastatin (PRAVACHOL) 20 MG tablet Take 20 mg by mouth daily.   Yes Historical Provider, MD   Current Facility-Administered Medications  Medication Dose Route Frequency Provider Last Rate Last Dose  . ALPRAZolam Duanne Moron) tablet 1 mg  1 mg Oral QHS PRN Norval Morton, MD   1 mg at 06/19/15 2306  . amLODipine (NORVASC) tablet 10 mg  10 mg Oral Daily Norval Morton, MD   10 mg at 06/20/15 1029  . aspirin EC tablet 81 mg  81 mg Oral Daily Rondell Charmayne Sheer, MD   81 mg at 06/20/15 1028  . buPROPion [facility] SR) 12 hr tablet 150 mg  150 mg Oral Daily Jake Church Masters, RPH   150 mg at 06/20/15 1030   And  . buPROPion [facility] SR) 12 hr tablet 300 mg  300 mg Oral QHS Jake Church Masters, RPH   300 mg at 06/19/15 2305  . cholecalciferol (VITAMIN D) tablet 1,000 Units  1,000 Units Oral Daily Norval Morton, MD   1,000 Units at 06/20/15 1028  . cinacalcet (SENSIPAR) tablet 30 mg  30 mg Oral Q breakfast Norval Morton, MD      . Derrill Memo ON 06/26/2015] cloNIDine (CATAPRES - Dosed in mg/24 hr) patch 0.2 mg  0.2 mg Transdermal Weekly Rondell A Tamala Julian, MD      . escitalopram (LEXAPRO) tablet 20 mg  20 mg Oral Daily Rondell A Tamala Julian, MD   20 mg at 06/20/15 1027  . furosemide (LASIX) tablet 80 mg  80 mg Oral BID Norval Morton, MD   80 mg at 06/20/15 1028  . heparin injection 5,000 Units  5,000 Units Subcutaneous 3 times per day Norval Morton, MD   5,000 Units at 06/20/15 0539  . hydrALAZINE (APRESOLINE) tablet 100 mg  100 mg Oral BID Norval Morton, MD   100 mg at 06/20/15 1029  . labetalol (NORMODYNE) tablet 50 mg  50 mg Oral BID Norval Morton, MD    50 mg at 06/20/15 1031  . pantoprazole (PROTONIX) EC tablet 80 mg  80 mg Oral Daily Norval Morton, MD   80 mg at 06/20/15 1030  . pravastatin (PRAVACHOL) tablet 20 mg  20 mg Oral Daily Norval Morton, MD   20 mg at 06/20/15 1028  . senna-docusate (Senokot-S) tablet 1 tablet  1 tablet Oral  QHS PRN Norval Morton, MD       Labs: Basic Metabolic Panel:  Recent Labs Lab 06/19/15 1440  NA 141  K 4.2  CL 103  CO2 25  GLUCOSE 148*  BUN 62*  CREATININE 10.20*  CALCIUM 8.6*  CBC:  Recent Labs Lab 06/19/15 1440  WBC 12.7*  HGB 9.7*  HCT 31.3*  MCV 88.2  PLT 368  CBG:  Recent Labs Lab 06/19/15 1512  GLUCAP 143*   Studies/Results: Dg Chest 2 View  06/20/2015  CLINICAL DATA:  Status post cerebral infarct 06/19/2015. EXAM: CHEST  2 VIEW COMPARISON:  PA and lateral chest 04/06/2015 and 05/16/2014. FINDINGS: The lungs are clear. Heart size is upper normal. No pneumothorax or pleural effusion. No focal bony abnormality. IMPRESSION: No acute disease. Electronically Signed   By: Inge Rise M.D.   On: 06/20/2015 07:57   Mr Brain Wo Contrast (neuro Protocol)  06/19/2015  CLINICAL DATA:  44 year old male, end-stage renal disease on dialysis with right side numbness x3 days. Right side weakness. Initial encounter. EXAM: MRI HEAD WITHOUT CONTRAST TECHNIQUE: Multiplanar, multiecho pulse sequences of the brain and surrounding structures were obtained without intravenous contrast. COMPARISON:  Head CT 12/22/2006 and earlier. FINDINGS: Scattered left PCA territory small/ lacunar type infarcts including multifocal involvement in the left thalamus, left occipital lobe, and perhaps also the posterior left hippocampus. No superimposed posterior fossa or right hemisphere restricted diffusion. Major intracranial vascular flow voids are preserved. No associated acute hemorrhage or intracranial mass effect. Fairly numerous chronic micro hemorrhages in both thalami, bilateral lentiform nuclei, the  pons, and left cerebellar hemispheres. Scattered chronic cerebral white matter T2 and FLAIR hyperintensity. No midline shift, mass effect, evidence of mass lesion, ventriculomegaly, extra-axial collection or acute intracranial hemorrhage. Cervicomedullary junction and pituitary are within normal limits. Negative visualized cervical spine. Visible internal auditory structures appear normal. Mastoids are clear. Mild maxillary sinus mucosal thickening or mucous retention cyst on the right. Negative orbit and scalp soft tissues. Visualized bone marrow signal is within normal limits. IMPRESSION: 1. Scattered acute mostly lacunar type infarcts in the left PCA territory including involvement of the left thalamus and occipital lobe. 2. No associated mass effect or acute hemorrhage. Scattered chronic microhemorrhages and nonspecific white matter changes compatible with chronic small vessel disease. Electronically Signed   By: Genevie Ann M.D.   On: 06/19/2015 19:56   Mr Cervical Spine Wo Contrast  06/19/2015  CLINICAL DATA:  44 year old male end-stage renal disease on dialysis with right side numbness and weakness for 3 days. Initial encounter. EXAM: MRI CERVICAL SPINE WITHOUT CONTRAST TECHNIQUE: Multiplanar, multisequence MR imaging of the cervical spine was performed. No intravenous contrast was administered. COMPARISON:  Brain MRI from 1910 hours today reported separately. FINDINGS: Study is intermittently degraded by motion artifact despite repeated imaging attempts. Straightening and mild reversal of cervical lordosis. No marrow edema or evidence of acute osseous abnormality. Negative paraspinal soft tissues. Cervicomedullary junction is within normal limits. No cervical spinal cord signal abnormality identified. C2-C3:  Negative. C3-C4:  Negative. C4-C5: Mild left paracentral broad-based disc protrusion (series 9, image 15) narrowing the ventral CSF space without significant spinal or foraminal stenosis. C5-C6: Left  paracentral disc extrusion, with some cephalad and caudal migration of disc. See series 9, image 19. Spinal stenosis with up to mild mass effect on the left hemi cord. No cord signal abnormality. Up to mild left C6 foraminal stenosis related to foraminal disc bulging. C6-C7: Central disc extrusion which appears somewhat contiguous with  that from the level above (series 9, image 23). Spinal stenosis with up to mild mass effect on the ventral spinal cord. No foraminal stenosis. C7-T1:  Mild facet hypertrophy.  No stenosis. No upper thoracic spinal stenosis. IMPRESSION: 1. 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 Associated cervical spinal stenosis with up to mild mass effect on the ventral/left hemicord. No spinal cord signal abnormality. 2. Mild left C6 foraminal stenosis. Electronically Signed   By: Genevie Ann M.D.   On: 06/19/2015 20:25    ROS: As per HPI otherwise negative.  Physical Exam: Filed Vitals:   06/20/15 0230 06/20/15 0430 06/20/15 0630 06/20/15 1019  BP: 170/77 157/90 184/74 164/84  Pulse: 91 90 92 91  Temp: 97.9 F (36.6 C) 98.5 F (36.9 C) 98.4 F (36.9 C) 98 F (36.7 C)  TempSrc: Oral Oral Oral Oral  Resp: 18 22 20 20   Height:      Weight:      SpO2: 99% 97% 98% 100%     General:   NAD - orthostatic BP done  158/67 to 140/80 HR stable 90s - he had worsening right sided numbness and weakness but not dizziness while standing Head: Normocephalic, atraumatic, sclera non-icteric, mucus membranes are moist, fundi with art narrowing and AV nicking.   Neck: Supple. JVD not elevated.PCL Lungs: Clear bilaterally to auscultation without wheezes, rales, or rhonchi. Breathing is unlabored. Heart: RRR with S1 S2. No murmurs, rubs, or gallops appreciated. Abdomen: Soft, non-tender, non-distended with normoactive bowel sounds.  Lower extremities: 1+edema or ischemic changes, no open wounds  Neuro: Alert and oriented X 3. Moves all extremities  spontaneously. Psych:  Responds to questions appropriately with a normal affect. Dialysis Access: left lower AVF + bruit/thrill  Dialysis Orders: Home HD NxStage 5 x a week MTWedTF CAR 170 1 K 40 lactate - volume per tmt 50 L Flow Fraction 77% BFR 500 EDW 109 left lower AVF heparin 2000  Prior incenter HD orders 180 EDW 105.5 510/800 2 K 2 Ca 4 hours Recent labs:  Hgb 10.1 stable 18% sat ferritin 1211 Oct Ca 11.7 K 4.8 CO@22  Ca 8.3 P 7.3 iPTH 151 Last Mircera 200 11/14 and 300 venofer on 11/14- was supposed to get 200 Mircera 12/19 and 200 venofer 200 12.19 Calcitriol 0.75 (he takes 1 0.5 alt with 2 qod)  Assessment/Plan: 1. Acute scattered lacunar infarcts - per MRI; MRI C spine showed central to left paracentral Cspine disc herniations from C4-5 and C6-7 with mild mass effect on the ventral left hemicord.- per Neuro/PT signed off Needs to stop smoking period 2.  ESRD -  Home Hemo - convert to 3 x per week HD while here 4.25 per HD per attending K 4.2 12/19. Last home HD was Sunday. Plan HD today. Need to have hometraining update his med list at discharge. 3.  Hypertension/volume  - avoid BP drop CXR neg on admission; takes lasix 80 bid in addition to multiple BP meds; he has fair amount of urine output and tells me he rarely has to set the machine to pull fluid. BP has been historically an issue for him. Needs lower vol ,lower meds, ?stop Lasix as poor risk/benefit ratio.   4.  Anemia  - Hgb 9.7 - due for redose ESA and venofer yesterday but missed appointment - will give meds today with HD 5.  Metabolic bone disease -  Ca 8.6 - check renal panel pre HD- has calcitriol 0.75 q d -  takes 1 cap alt with 2 as outpt because he can't cut the 0.5 capsules in half; continue sensipar/resume tums 6.  Nutrition - renal diet + vitamin 7.  DM/ hyperlipidemia -on statin, diet controlled DMnot clear has any benefit 8.  Depression - current meds  Myriam Jacobson, PA-C [facility]  [phone] 06/20/2015, 11:10 AM I have seen and examined this patient and agree with the plan of care seen, eval, examined,discussed.  .  Garron Eline L 06/20/2015, 12:58 PM

## 2015-06-20 NOTE — Progress Notes (Addendum)
STROKE TEAM PROGRESS NOTE   HISTORY Hayden [name] is an 44 y.o. male with hx of ESRD on hemodialysis who presented with R sided numbness since 3 days ago. No complaints of headache, weakness, double vision, SOB. States this started at 2am 3 days (06/16/2015) ago when he went to the bathroom and realized he couldn't really walk back normally because his leg felt like it was asleep. He sates his face, chest , arm and leg all feel the same way, right split in the middle of his body. Patient was not administered TPA secondary to delay in arrival. He was admitted for further evaluation and treatment.   SUBJECTIVE (INTERVAL HISTORY)  Patient is accompanied by his wife. He states his sensory symptoms have persisted. He denies any prior history of stroke or TIA.  OBJECTIVE Temp:  [97.2 F (36.2 C)-98.5 F (36.9 C)] 97.2 F (36.2 C) (12/20 1309) Pulse Rate:  [84-98] 87 (12/20 1330) Cardiac Rhythm:  [-] Normal sinus rhythm;Heart block (12/20 0700) Resp:  [18-24] 19 (12/20 1330) BP: (123-184)/(52-90) 162/84 mmHg (12/20 1330) SpO2:  [95 %-100 %] 100 % (12/20 1309) Weight:  [108 kg (238 lb 1.6 oz)-108.3 kg (238 lb 12.1 oz)] 108.3 kg (238 lb 12.1 oz) (12/20 1309)  CBC:  Recent Labs Lab 06/19/15 1440  WBC 12.7*  HGB 9.7*  HCT 31.3*  MCV 88.2  PLT 123XX123    Basic Metabolic Panel:  Recent Labs Lab 06/19/15 1440  NA 141  K 4.2  CL 103  CO2 25  GLUCOSE 148*  BUN 62*  CREATININE 10.20*  CALCIUM 8.6*    Lipid Panel:    Component Value Date/Time   CHOL 194 06/20/2015 0810   TRIG 188* 06/20/2015 0810   HDL 31* 06/20/2015 0810   CHOLHDL 6.3 06/20/2015 0810   VLDL 38 06/20/2015 0810   LDLCALC 125* 06/20/2015 0810   HgbA1c:  Lab Results  Component Value Date   HGBA1C 9.9* 09/22/2012   Urine Drug Screen:    Component Value Date/Time   LABOPIA POSITIVE* 09/22/2012 1805   COCAINSCRNUR NONE DETECTED 09/22/2012 1805   LABBENZ NONE DETECTED 09/22/2012 1805   AMPHETMU NONE DETECTED  09/22/2012 1805   THCU NONE DETECTED 09/22/2012 1805   LABBARB NONE DETECTED 09/22/2012 1805      IMAGING  Dg Chest 2 View 06/20/2015   No acute disease.   Mr Brain Wo Contrast (neuro Protocol) 06/19/2015   1. Scattered acute mostly lacunar type infarcts in the left PCA territory including involvement of the left thalamus and occipital lobe. 2. No associated mass effect or acute hemorrhage. Scattered chronic microhemorrhages and nonspecific white matter changes compatible with chronic small vessel disease.   Mr Cervical Spine Wo Contrast 06/19/2015  1. 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 Associated cervical spinal stenosis with up to mild mass effect on the ventral/left hemicord. No spinal cord signal abnormality. 2. Mild left C6 foraminal stenosis.    PHYSICAL EXAM Obese young male not in distress. Has dialysis AV fistula left forearm. . Afebrile. Head is nontraumatic. Neck is supple without bruit.    Cardiac exam no murmur or gallop. Lungs are clear to auscultation. Distal pulses are well felt. Neurological Exam :  Awake alert oriented 3 with normal speech and language function. No aphasia or apraxia dysarthria. Pupils are equal reactive. Fundi were not visualized. Vision acuity and fields seem adequate. Face is symmetric. Tongue is midline. Motor system exam revealed no upper or lower extremity  drift. Symmetric and equal strength in all 4 extremities. Right hemibody sensory loss with diminished touch pinprick as well as vibration sensation with splitting of the midline as well as forehead. Coordination is accurate. Gait was not tested. ASSESSMENT/PLAN Hayden [name] is a 44 y.o. male with history of ESRD on HD presenting with R sided numbness x 3 days. He did not receive IV t-PA due to delay in arrival.   Stroke:  Scattered L thalamic and occipital lobe lacunar infarcts secondary to small vessel disease source  Resultant  exacerbation  of neuro symptoms with midline spliting  MRI  scattered L thalamic and occipital lobe lacunar infarcts   MRA  Not ordered   TCD ordered  Carotid Doppler  pending   2D Echo  ordered  LDL 125  HgbA1c pending  Heparin 5000 units sq tid for VTE prophylaxis  Diet renal/carb modified with fluid restriction Diet-HS Snack?: Nothing; Room service appropriate?: Yes; Fluid consistency:: Thin  aspirin 81 mg daily prior to admission, now on aspirin 81 mg daily. Continue aspirin 81 mg at discharge (no indication to change antiplatelet)  Patient counseled to be compliant with his antithrombotic medications  Ongoing aggressive stroke risk factor management  Therapy recommendations:  No PT  Disposition:  Return home  Hypertension  Stable  Hyperlipidemia  Home meds:  PRAVACHOL 20, resumed in hospital  LDL 1256, goal < 70  Continue statin at discharge  Diabetes  HgbA1c pending , goal < 7.0  Other Stroke Risk Factors  Cigarette smoker, advised to stop smoking  THC use, UDS positive this admission  ETOH use  Obesity, Body mass index is 34.26 kg/(m^2).   Other Active Problems  ESRD on HD  Hx kidney cancer  Hx CHF, systolic and diastolic  Hospital day # 1 I have personally examined this patient, reviewed notes, independently viewed imaging studies, participated in medical decision making and plan of care. I have made any additions or clarifications directly to the above note. Agree with note above. He presented with 3 day history of right-sided numbness secondary to left thalamic lacunar infarct likely from small vessel disease. His sensory exam does have some nonorganic features. He remains at risk for neurological worsening, recurrent stroke, TIA needs ongoing stroke evaluation and aggressive risk factor modification. Recommend aspirin for stroke prevention MRI cervical spine shows degenerative spine disease with disc bulging and spinal stenosis but clinical exam does not  have myelopathic features. Antony Contras, MD Medical Director [facility] Pager: [phone] 06/20/2015 8:09 PM    To contact Stroke Continuity provider, please refer to [email or web address] After hours, contact General Neurology

## 2015-06-20 NOTE — Progress Notes (Signed)
SLP Cancellation Note  Patient Details Name: Hayden [name] MRN: [id number] DOB: October 13, 1970   Cancelled treatment:        Pt in HD. Will return for assessment                                                                                                 Teddy Pena, Katherene Ponto 06/20/2015, 1:38 PM

## 2015-06-21 ENCOUNTER — Ambulatory Visit (HOSPITAL_COMMUNITY): Payer: BLUE CROSS/BLUE SHIELD

## 2015-06-21 DIAGNOSIS — I639 Cerebral infarction, unspecified: Secondary | ICD-10-CM

## 2015-06-21 DIAGNOSIS — I6789 Other cerebrovascular disease: Secondary | ICD-10-CM

## 2015-06-21 LAB — CBC
HEMATOCRIT: 30 % — AB (ref 39.0–52.0)
Hemoglobin: 9.6 g/dL — ABNORMAL LOW (ref 13.0–17.0)
MCH: 28 pg (ref 26.0–34.0)
MCHC: 32 g/dL (ref 30.0–36.0)
MCV: 87.5 fL (ref 78.0–100.0)
Platelets: 364 10*3/uL (ref 150–400)
RBC: 3.43 MIL/uL — ABNORMAL LOW (ref 4.22–5.81)
RDW: 14.6 % (ref 11.5–15.5)
WBC: 10.2 10*3/uL (ref 4.0–10.5)

## 2015-06-21 LAB — RENAL FUNCTION PANEL
ALBUMIN: 3.6 g/dL (ref 3.5–5.0)
Anion gap: 12 (ref 5–15)
BUN: 43 mg/dL — AB (ref 6–20)
CO2: 29 mmol/L (ref 22–32)
Calcium: 8 mg/dL — ABNORMAL LOW (ref 8.9–10.3)
Chloride: 97 mmol/L — ABNORMAL LOW (ref 101–111)
Creatinine, Ser: 8.36 mg/dL — ABNORMAL HIGH (ref 0.61–1.24)
GFR calc Af Amer: 8 mL/min — ABNORMAL LOW (ref >60)
GFR calc non Af Amer: 7 mL/min — ABNORMAL LOW (ref >60)
GLUCOSE: 82 mg/dL (ref 65–99)
PHOSPHORUS: 7 mg/dL — AB (ref 2.5–4.6)
POTASSIUM: 3.8 mmol/L (ref 3.5–5.1)
Sodium: 138 mmol/L (ref 135–145)

## 2015-06-21 LAB — HEMOGLOBIN A1C
HEMOGLOBIN A1C: 5.4 % (ref 4.8–5.6)
MEAN PLASMA GLUCOSE: 108 mg/dL

## 2015-06-21 NOTE — Progress Notes (Signed)
Pt discharged home with spouse. To perform home dialysis tomorrow. Has enough supplies. IV removed and discharge instructions given. Pt politely refuses staff to escort to car. Strongly encouraged pt to stop smoking cigarettes and taking illicit drugs. Verbalizes understanding. Ambulated off unit at 1745. Hayden [name]

## 2015-06-21 NOTE — Progress Notes (Signed)
STROKE TEAM PROGRESS NOTE   HISTORY Hayden [name] is an 44 y.o. male with hx of ESRD on hemodialysis who presented with R sided numbness since 3 days ago. No complaints of headache, weakness, double vision, SOB. States this started at 2am 3 days (06/16/2015) ago when he went to the bathroom and realized he couldn't really walk back normally because his leg felt like it was asleep. He sates his face, chest , arm and leg all feel the same way, right split in the middle of his body. Patient was not administered TPA secondary to delay in arrival. He was admitted for further evaluation and treatment.   SUBJECTIVE (INTERVAL HISTORY)  Patient is accompanied by his wife. He states his sensory symptoms have improved.    OBJECTIVE Temp:  [97.2 F (36.2 C)-98.6 F (37 C)] 98.3 F (36.8 C) (12/21 1354) Pulse Rate:  [85-92] 88 (12/21 1354) Cardiac Rhythm:  [-] Normal sinus rhythm (12/21 0722) Resp:  [15-26] 20 (12/21 1354) BP: (122-173)/(67-85) 173/81 mmHg (12/21 1354) SpO2:  [95 %-100 %] 95 % (12/21 1354) Weight:  [233 lb 14.5 oz (106.1 kg)] 233 lb 14.5 oz (106.1 kg) (12/20 1600)  CBC:   Recent Labs Lab 06/20/15 1315 06/21/15 0628  WBC 10.4 10.2  HGB 9.0* 9.6*  HCT 27.4* 30.0*  MCV 86.7 87.5  PLT 351 [id number]    Basic Metabolic Panel:   Recent Labs Lab 06/20/15 1315 06/21/15 0628  NA 143 138  K 4.0 3.8  CL 102 97*  CO2 24 29  GLUCOSE 102* 82  BUN 71* 43*  CREATININE 10.61* 8.36*  CALCIUM 7.8* 8.0*  PHOS 7.3* 7.0*    Lipid Panel:     Component Value Date/Time   CHOL 194 06/20/2015 0810   TRIG 188* 06/20/2015 0810   HDL 31* 06/20/2015 0810   CHOLHDL 6.3 06/20/2015 0810   VLDL 38 06/20/2015 0810   LDLCALC 125* 06/20/2015 0810   HgbA1c:  Lab Results  Component Value Date   HGBA1C 5.4 06/20/2015   Urine Drug Screen:     Component Value Date/Time   LABOPIA POSITIVE* 09/22/2012 1805   COCAINSCRNUR NONE DETECTED 09/22/2012 1805   LABBENZ NONE DETECTED 09/22/2012 1805    AMPHETMU NONE DETECTED 09/22/2012 1805   THCU NONE DETECTED 09/22/2012 1805   LABBARB NONE DETECTED 09/22/2012 1805      IMAGING  Dg Chest 2 View 06/20/2015   No acute disease.   Mr Brain Wo Contrast (neuro Protocol) 06/19/2015   1. Scattered acute mostly lacunar type infarcts in the left PCA territory including involvement of the left thalamus and occipital lobe. 2. No associated mass effect or acute hemorrhage. Scattered chronic microhemorrhages and nonspecific white matter changes compatible with chronic small vessel disease.   Mr Cervical Spine Wo Contrast 06/19/2015  1. 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 Associated cervical spinal stenosis with up to mild mass effect on the ventral/left hemicord. No spinal cord signal abnormality. 2. Mild left C6 foraminal stenosis.    PHYSICAL EXAM Obese young male not in distress. Has dialysis AV fistula left forearm. . Afebrile. Head is nontraumatic. Neck is supple without bruit.    Cardiac exam no murmur or gallop. Lungs are clear to auscultation. Distal pulses are well felt. Neurological Exam :  Awake alert oriented 3 with normal speech and language function. No aphasia or apraxia dysarthria. Pupils are equal reactive. Fundi were not visualized. Vision acuity and fields seem adequate. Face is symmetric.  Tongue is midline. Motor system exam revealed no upper or lower extremity drift. Symmetric and equal strength in all 4 extremities. Right hemibody sensory loss with diminished touch pinprick as well as vibration sensation with splitting of the midline as well as forehead. Coordination is accurate. Gait was not tested. ASSESSMENT/PLAN [name] is a 44 y.o. male with history of ESRD on HD presenting with R sided numbness x 3 days. He did not receive IV t-PA due to delay in arrival.   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 Vertebral arteries are patent with antegrade flow.  2D Echo  ordered  LDL 125  HgbA1c 5.4  Heparin 5000 units sq tid for VTE prophylaxis Diet renal/carb modified with fluid restriction Diet-HS Snack?: Nothing; Room service appropriate?: Yes; Fluid consistency:: Thin  aspirin 81 mg daily prior to admission, now on aspirin 81 mg daily. Continue aspirin 81 mg at discharge (no indication to change antiplatelet)  Patient counseled to be compliant with his antithrombotic medications  Ongoing aggressive stroke risk factor management  Therapy recommendations:  No PT  Disposition:  Return home  Hypertension  Stable  Hyperlipidemia  Home meds:  PRAVACHOL 20, resumed in hospital  LDL 1256, goal < 70  Continue statin at discharge  Diabetes  HgbA1c pending , goal < 7.0  Other Stroke Risk Factors  Cigarette smoker, advised to stop smoking  THC use, UDS positive this admission  ETOH use  Obesity, Body mass index is 33.56 kg/(m^2).   Other Active Problems  ESRD on HD  Hx kidney cancer  Hx CHF, systolic and diastolic  Hospital day # 2 I have personally examined this patient, reviewed notes, independently viewed imaging studies, participated in medical decision making and plan of care. I have made any additions or clarifications directly to the above note.  Marland Kitchen He presented with 3 day history of right-sided numbness secondary to left thalamic lacunar infarct likely from small vessel disease. His sensory exam does have some nonorganic features but is improving..   Recommend aspirin for stroke prevention   Antony Contras, MD Medical Director [facility] Pager: [phone] 06/21/2015 3:05 PM    To contact Stroke  Continuity provider, please refer to [email or web address] After hours, contact General Neurology

## 2015-06-21 NOTE — Progress Notes (Signed)
Occupational Therapy Evaluation/Discharge Patient Details Name: Hayden [name] MRN: [id number] DOB: [birth year] Today's Date: 06/21/2015    History of Present Illness Adm with Rt sided numbness. MRI Lt PCA territory lacunar infarcts (Lt thalamus, occipital). MRI cervical spine with C4-7 spinal stenosis due to disc herniations with mild effect on ventral hemicord PMHx- DM, ESRD on HD, HTN   Clinical Impression   Pt evaluated by Occupational Therapy with no further acute OT needs identified. Pt reports numbness on R side has diminished and now only occurs in R leg with it increasing in severity when standing. Pt has difficulty tracking objects to R side and nystagmus noted in both eyes - pt reports no functional deficits resulting from this. Advised pt to follow up with optometrist on d/c. OT signing off. Thank you for this referral.    Follow Up Recommendations  No OT follow up    Equipment Recommendations  None recommended by OT    Recommendations for Other Services       Precautions / Restrictions Restrictions Weight Bearing Restrictions: No      Mobility Bed Mobility Overal bed mobility: Independent                Transfers Overall transfer level: Independent Equipment used: None                  Balance Overall balance assessment: Independent                                          ADL Overall ADL's : Independent                                       General ADL Comments: Completed toilet and shower transfers and dressing tasks independently with no LOB      Vision Vision Assessment?: Yes Eye Alignment: Within Functional Limits Ocular Range of Motion: Within Functional Limits Alignment/Gaze Preference: Within Defined Limits Tracking/Visual Pursuits: Decreased smoothness of horizontal tracking;Decreased smoothness of vertical tracking Saccades: Additional eye shifts occurred during testing;Additional head turns  occurred during testing Convergence: Within functional limits Visual Fields: Other (comment) (Slight R peripheral field loss (~20 degrees) ) Additional Comments: When tracking quick moving object to R side, both eyes jump (R eye lateral, L eye medial) and are unable to track object past midline. Nystagmus noted in both eyes when tracking to R side.    Perception     Praxis      Pertinent Vitals/Pain Pain Assessment: 0-10 Pain Score: 1  Pain Location: R leg Pain Descriptors / Indicators: Numbness;Tingling Pain Intervention(s): Limited activity within patient's tolerance;Monitored during session;Repositioned     Hand Dominance Right   Extremity/Trunk Assessment Upper Extremity Assessment Upper Extremity Assessment: RUE deficits/detail RUE Deficits / Details: slight weakness generally 4+/5, ROM WFL, sensation numbness/tingling has mostly resolved  RUE Sensation: decreased light touch   Lower Extremity Assessment Lower Extremity Assessment: Defer to PT evaluation   Cervical / Trunk Assessment Cervical / Trunk Assessment: Normal   Communication Communication Communication: No difficulties   Cognition Arousal/Alertness: Awake/alert Behavior During Therapy: Flat affect Overall Cognitive Status: Within Functional Limits for tasks assessed                     General Comments       Exercises  Shoulder Instructions      Home Living Family/patient expects to be discharged to:: Private residence Living Arrangements: Spouse/significant other;Children Available Help at Discharge: Family;Available 24 hours/day Type of Home: Mobile home Home Access: Stairs to enter Entrance Stairs-Number of Steps: 4 Entrance Stairs-Rails: Right;Left;Can reach both Home Layout: One level     Bathroom Shower/Tub: Walk-in shower;Door   Bathroom Toilet: Standard Bathroom Accessibility: Yes   Home Equipment: Cane - single point;Grab bars - tub/shower;Hand held shower head           Prior Functioning/Environment Level of Independence: Independent        Comments: Driving not currently working    OT Diagnosis: Paresis (R side)   OT Problem List: Impaired sensation;Decreased strength;Decreased safety awareness   OT Treatment/Interventions:      OT Goals(Current goals can be found in the care plan section) Acute Rehab OT Goals Patient Stated Goal: to get back on kidney transplant list after this stroke OT Goal Formulation: With patient Time For Goal Achievement: 07/05/15 Potential to Achieve Goals: Good  OT Frequency:     Barriers to D/C:            Co-evaluation              End of Session Nurse Communication: Mobility status  Activity Tolerance: Patient tolerated treatment well Patient left: in bed;with call bell/phone within reach;with nursing/sitter in room;with family/visitor present   Time: 1355-1414 OT Time Calculation (min): 19 min Charges:  OT General Charges $OT Visit: 1 Procedure OT Evaluation $Initial OT Evaluation Tier I: 1 Procedure OT Treatments $Self Care/Home Management : 8-22 mins G-Codes:    Redmond Baseman, OTR/L July 11, 2015, 2:29 PM Pager: [phone]

## 2015-06-21 NOTE — Progress Notes (Signed)
Subjective: Interval History: has no complaint .  Objective: Vital signs in last 24 hours: Temp:  [97.2 F (36.2 C)-98.6 F (37 C)] 97.9 F (36.6 C) (12/21 0509) Pulse Rate:  [84-94] 88 (12/21 0509) Resp:  [15-29] 20 (12/21 0509) BP: (122-164)/(67-95) 164/67 mmHg (12/21 0509) SpO2:  [97 %-100 %] 100 % (12/21 0509) Weight:  [106.1 kg (233 lb 14.5 oz)-108.3 kg (238 lb 12.1 oz)] 106.1 kg (233 lb 14.5 oz) (12/20 1600) Weight change: 0.3 kg (10.6 oz)  Intake/Output from previous day: 12/20 0701 - 12/21 0700 In: -  Out: 1205  Intake/Output this shift:    General appearance: alert, cooperative, moderately obese and pale Resp: diminished breath sounds bilaterally Cardio: S1, S2 normal and systolic murmur: holosystolic 2/6, blowing at apex GI: obese, pos bs, liver down 6 cm Extremities: edema 1+ and AVF LFA  Lab Results:  Recent Labs  06/20/15 1315 06/21/15 0628  WBC 10.4 10.2  HGB 9.0* 9.6*  HCT 27.4* 30.0*  PLT 351 364   BMET:  Recent Labs  06/20/15 1315 06/21/15 0628  NA 143 138  K 4.0 3.8  CL 102 97*  CO2 24 29  GLUCOSE 102* 82  BUN 71* 43*  CREATININE 10.61* 8.36*  CALCIUM 7.8* 8.0*   No results for input(s): PTH in the last 72 hours. Iron Studies: No results for input(s): IRON, TIBC, TRANSFERRIN, FERRITIN in the last 72 hours.  Studies/Results: Dg Chest 2 View  06/20/2015  CLINICAL DATA:  Status post cerebral infarct 06/19/2015. EXAM: CHEST  2 VIEW COMPARISON:  PA and lateral chest 04/06/2015 and 05/16/2014. FINDINGS: The lungs are clear. Heart size is upper normal. No pneumothorax or pleural effusion. No focal bony abnormality. IMPRESSION: No acute disease. Electronically Signed   By: Inge Rise M.D.   On: 06/20/2015 07:57   Mr Brain Wo Contrast (neuro Protocol)  06/19/2015  CLINICAL DATA:  44 year old male, end-stage renal disease on dialysis with right side numbness x3 days. Right side weakness. Initial encounter. EXAM: MRI HEAD WITHOUT CONTRAST  TECHNIQUE: Multiplanar, multiecho pulse sequences of the brain and surrounding structures were obtained without intravenous contrast. COMPARISON:  Head CT 12/22/2006 and earlier. FINDINGS: Scattered left PCA territory small/ lacunar type infarcts including multifocal involvement in the left thalamus, left occipital lobe, and perhaps also the posterior left hippocampus. No superimposed posterior fossa or right hemisphere restricted diffusion. Major intracranial vascular flow voids are preserved. No associated acute hemorrhage or intracranial mass effect. Fairly numerous chronic micro hemorrhages in both thalami, bilateral lentiform nuclei, the pons, and left cerebellar hemispheres. Scattered chronic cerebral white matter T2 and FLAIR hyperintensity. No midline shift, mass effect, evidence of mass lesion, ventriculomegaly, extra-axial collection or acute intracranial hemorrhage. Cervicomedullary junction and pituitary are within normal limits. Negative visualized cervical spine. Visible internal auditory structures appear normal. Mastoids are clear. Mild maxillary sinus mucosal thickening or mucous retention cyst on the right. Negative orbit and scalp soft tissues. Visualized bone marrow signal is within normal limits. IMPRESSION: 1. Scattered acute mostly lacunar type infarcts in the left PCA territory including involvement of the left thalamus and occipital lobe. 2. No associated mass effect or acute hemorrhage. Scattered chronic microhemorrhages and nonspecific white matter changes compatible with chronic small vessel disease. Electronically Signed   By: Genevie Ann M.D.   On: 06/19/2015 19:56   Mr Cervical Spine Wo Contrast  06/19/2015  CLINICAL DATA:  44 year old male end-stage renal disease on dialysis with right side numbness and weakness for 3 days. Initial encounter.  EXAM: MRI CERVICAL SPINE WITHOUT CONTRAST TECHNIQUE: Multiplanar, multisequence MR imaging of the cervical spine was performed. No intravenous  contrast was administered. COMPARISON:  Brain MRI from 1910 hours today reported separately. FINDINGS: Study is intermittently degraded by motion artifact despite repeated imaging attempts. Straightening and mild reversal of cervical lordosis. No marrow edema or evidence of acute osseous abnormality. Negative paraspinal soft tissues. Cervicomedullary junction is within normal limits. No cervical spinal cord signal abnormality identified. C2-C3:  Negative. C3-C4:  Negative. C4-C5: Mild left paracentral broad-based disc protrusion (series 9, image 15) narrowing the ventral CSF space without significant spinal or foraminal stenosis. C5-C6: Left paracentral disc extrusion, with some cephalad and caudal migration of disc. See series 9, image 19. Spinal stenosis with up to mild mass effect on the left hemi cord. No cord signal abnormality. Up to mild left C6 foraminal stenosis related to foraminal disc bulging. C6-C7: Central disc extrusion which appears somewhat contiguous with that from the level above (series 9, image 23). Spinal stenosis with up to mild mass effect on the ventral spinal cord. No foraminal stenosis. C7-T1:  Mild facet hypertrophy.  No stenosis. No upper thoracic spinal stenosis. IMPRESSION: 1. 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 Associated cervical spinal stenosis with up to mild mass effect on the ventral/left hemicord. No spinal cord signal abnormality. 2. Mild left C6 foraminal stenosis. Electronically Signed   By: Genevie Ann M.D.   On: 06/19/2015 20:25    I have reviewed the patient's current medications.  Assessment/Plan: 1 ESRD  Vol xs.  Not doing full tx. Contrib to CVAs and HTN 2 Anemia stable 3 BP ^ with xs vol 4 HPTH 5 Obesiity 6 Smoking 7 Noncompliance P HD in am ,discuss with HT.     LOS: 2 days   Shemaiah Round L 06/21/2015,8:38 AM

## 2015-06-21 NOTE — Progress Notes (Signed)
Pt telemetry on standby. Allowed to take shower using chair and wife to assist. Pt verbalizes risk for fall/injury. Declines staff assistance. Wendee Copp

## 2015-06-21 NOTE — Discharge Instructions (Signed)
Follow with Primary MD Louis Meckel, MD   Get CBC, CMP, 2 view Chest X ray checked  by Primary MD next visit.    Activity: As tolerated with Full fall precautions use walker/cane & assistance as needed   Disposition Home    Diet: Heart Healthy, renal modified  , with feeding assistance and aspiration precautions.  For Heart failure patients - Check your Weight same time everyday, if you gain over 2 pounds, or you develop in leg swelling, experience more shortness of breath or chest pain, call your Primary MD immediately. Follow Cardiac Low Salt Diet and 1.5 lit/day fluid restriction.   On your next visit with your primary care physician please Get Medicines reviewed and adjusted.   Please request your Prim.MD to go over all Hospital Tests and Procedure/Radiological results at the follow up, please get all Hospital records sent to your Prim MD by signing hospital release before you go home.   If you experience worsening of your admission symptoms, develop shortness of breath, life threatening emergency, suicidal or homicidal thoughts you must seek medical attention immediately by calling 911 or calling your MD immediately  if symptoms less severe.  You Must read complete instructions/literature along with all the possible adverse reactions/side effects for all the Medicines you take and that have been prescribed to you. Take any new Medicines after you have completely understood and accpet all the possible adverse reactions/side effects.   Do not drive, operating heavy machinery, perform activities at heights, swimming or participation in water activities or provide baby sitting services if your were admitted for syncope or siezures until you have seen by Primary MD or a Neurologist and advised to do so again.  Do not drive when taking Pain medications.    Do not take more than prescribed Pain, Sleep and Anxiety Medications  Special Instructions: If you have smoked or chewed  Tobacco  in the last 2 yrs please stop smoking, stop any regular Alcohol  and or any Recreational drug use.  Wear Seat belts while driving.   Please note  You were cared for by a hospitalist during your hospital stay. If you have any questions about your discharge medications or the care you received while you were in the hospital after you are discharged, you can call the unit and asked to speak with the hospitalist on call if the hospitalist that took care of you is not available. Once you are discharged, your primary care physician will handle any further medical issues. Please note that NO REFILLS for any discharge medications will be authorized once you are discharged, as it is imperative that you return to your primary care physician (or establish a relationship with a primary care physician if you do not have one) for your aftercare needs so that they can reassess your need for medications and monitor your lab values.

## 2015-06-21 NOTE — Progress Notes (Signed)
Echocardiogram 2D Echocardiogram has been performed.  Tresa Res 06/21/2015, 2:14 PM

## 2015-06-21 NOTE — Discharge Summary (Signed)
Hayden [name], is a 44 y.o. male  DOB 05/25/1971  MRN [id number].  Admission date:  06/19/2015  Admitting Physician  Norval Morton, MD  Discharge Date:  06/21/2015   Primary MD  Louis Meckel, MD  Recommendations for primary care physician for things to follow:  - Check CBC, BMP during next visit. - Continue counseling about tobacco use cessation   Admission Diagnosis  ESRD (end stage renal disease) on dialysis ([place]) [N18.6, Z99.2] Lacunar infarct, acute ([place]) [I63.9] Numbness and tingling of right arm and leg [R20.2] Numbness on right side [R20.0]   Discharge Diagnosis  ESRD (end stage renal disease) on dialysis ([place]) [N18.6, Z99.2] Lacunar infarct, acute ([place]) [I63.9] Numbness and tingling of right arm and leg [R20.2] Numbness on right side [R20.0]    Principal Problem:   Lacunar infarct, acute ([place]) Active Problems:   DM2 (diabetes mellitus, type 2) ([place])   End stage renal disease on dialysis ([place])   GERD (gastroesophageal reflux disease)   Anxiety and depression   Hypertension, well controlled      Past Medical History  Diagnosis Date  . History of kidney cancer   . Hypertension   . Diabetes mellitus   . CHF (congestive heart failure) ([place]) [id number]    Acute systolic and diastolic CHF  . Renal disorder   . Obesity   . Hyperlipidemia   . Anemia March 2014  . Cancer ([place])     Kidney  . PONV (postoperative nausea and vomiting)   . Depression     & rage --  was in counseling....great now    Past Surgical History  Procedure Laterality Date  . Nephrectomy Right 2008    partial  . Testicle torsion reduction    . Hernia repair    . Tonsillectomy and adenoidectomy    . Av fistula placement Left 01/26/2013    Procedure: ARTERIOVENOUS (AV) FISTULA CREATION - LEFT RADIAL CEPHALIC AVF;  Surgeon: Angelia Mould, MD;  Location: [facility];  Service: Vascular;  Laterality:  Left;       History of present illness and  Hospital Course:     Kindly see H&P for history of present illness and admission details, please review complete Labs, Consult reports and Test reports for all details in brief  HPI  from the history and physical done on the day of admission 06/19/2015 Hayden [name] is a 44 year old male right-hand dominant with past medical history of ESRD on HD, hypertension, diet-controlled diabetes mellitus type 2; who presents with right sided numbness. Patient notes that symptoms initially started at 2 AM 2 days ago (12/17). Patient notes that he had gotten up to use the bathroom and noticed that he had difficulty in walking back to the bedroom. He reported right sided numbness going from his face all way down to his leg. Describes the feeling as if his right side of his body is asleep and has a tingling sensation. Denies any loss of strength on the right side. He reports difficulty with his balance,  hand eye coordination, and slowed/slurred speech when trying to walk. He reports associated symptoms of decreased peripheral vision out of the right eye. He denies any falls, chest pain, shortness of breath, frequency of urination, dysuria, palpitations, fever, or chills. He questions if this will affect the possibility of him having a kidney transplant. He reports that he just had echocardiogram and multiple other tests done within the last month at [facility] for the possible kidney transplant.  Upon admission into the emergency department patient was evaluated with a MRI of the brain which showed scattered acute mostly lacunar type infarcts in the left PCA territory including involvement of the left thalamus and occipital lobe.   Hospital Course  44 year old male right-hand dominant with past medical history of ESRD on HD, hypertension, diet-controlled diabetes mellitus type 2; who presents with right sided numbness, MRI positive for scattered acute mostly lacunar type  infarcts in the left PCA territory including involvement of the left thalamus and occipital lobe   Lacunar infarct, acute:  - Scattered L thalamic and occipital lobe lacunar infarcts secondary to small vessel disease source - MRI brain with evidence of scattered left thalamic and occipital lobe lacunar infarcts, carotid Doppler with no significant external carotid artery stenosis. - 2-D echo with EF 5560%, no evidence of embolic source, grade 2 diastolic dysfunction. - LDL is 125, patient is on statin - Hemoglobin A1c is 5.4. - Patient to continue with aspirin 81 mg on discharge as per neuro recommendation   End stage renal disease on dialysis [facility]) - nephrology consulted, patient is on home hemodialysis  DM2 (diabetes mellitus, type 2) - Appears well controlled on patient not on any medications - Hemoglobin A1c is 5.4  Anemia of chronic kidney disease - in the setting of ESRD  Hypertension - Resume home medication on discharge  Cervical disc herniations - Incidental finding on MRI of the spine. Patient with left paracentral cervical spine disc herniations from C4-C5 to C6-C7. Associated cervical spinal stenosis with up to mild mass effect on the ventral/left hemicord. No spinal cord signal abnormality. Mild left C6 foraminal stenosis. Likely chronic, asymptomatic  Anxiety/depression - Continue home medications of xanax, Lexapro, and Wellbutrin  GERD - Continue Protonix  Hyperlipidemia - Continue with statin    Discharge Condition: Stable   Follow UP  Follow-up Information    Follow up with Louis Meckel, MD.   Specialty:  Nephrology   Contact information:   [place] [address] [phone]         Discharge Instructions  and  Discharge Medications     Discharge Instructions    Discharge instructions    Complete by:  As directed   ollow with Primary MD Corliss Parish A, MD   Get CBC, CMP, 2 view Chest X ray checked  by Primary  MD next visit.    Activity: As tolerated with Full fall precautions use walker/cane & assistance as needed   Disposition Home    Diet: Heart Healthy, renal modified  , with feeding assistance and aspiration precautions.  For Heart failure patients - Check your Weight same time everyday, if you gain over 2 pounds, or you develop in leg swelling, experience more shortness of breath or chest pain, call your Primary MD immediately. Follow Cardiac Low Salt Diet and 1.5 lit/day fluid restriction.   On your next visit with your primary care physician please Get Medicines reviewed and adjusted.   Please request your Prim.MD to go over all Hospital Tests and  Procedure/Radiological results at the follow up, please get all Hospital records sent to your Prim MD by signing hospital release before you go home.   If you experience worsening of your admission symptoms, develop shortness of breath, life threatening emergency, suicidal or homicidal thoughts you must seek medical attention immediately by calling 911 or calling your MD immediately  if symptoms less severe.  You Must read complete instructions/literature along with all the possible adverse reactions/side effects for all the Medicines you take and that have been prescribed to you. Take any new Medicines after you have completely understood and accpet all the possible adverse reactions/side effects.   Do not drive, operating heavy machinery, perform activities at heights, swimming or participation in water activities or provide baby sitting services if your were admitted for syncope or siezures until you have seen by Primary MD or a Neurologist and advised to do so again.  Do not drive when taking Pain medications.    Do not take more than prescribed Pain, Sleep and Anxiety Medications  Special Instructions: If you have smoked or chewed Tobacco  in the last 2 yrs please stop smoking, stop any regular Alcohol  and or any Recreational drug  use.  Wear Seat belts while driving.   Please note  You were cared for by a hospitalist during your hospital stay. If you have any questions about your discharge medications or the care you received while you were in the hospital after you are discharged, you can call the unit and asked to speak with the hospitalist on call if the hospitalist that took care of you is not available. Once you are discharged, your primary care physician will handle any further medical issues. Please note that NO REFILLS for any discharge medications will be authorized once you are discharged, as it is imperative that you return to your primary care physician (or establish a relationship with a primary care physician if you do not have one) for your aftercare needs so that they can reassess your need for medications and monitor your lab values.     Increase activity slowly    Complete by:  As directed             Medication List    TAKE these medications        ALPRAZolam 1 MG tablet  Commonly known as:  XANAX  Take 1 mg by mouth at bedtime as needed for anxiety.     amLODipine 10 MG tablet  Commonly known as:  NORVASC  Take 10 mg by mouth daily.     aspirin EC 81 MG tablet  Take 81 mg by mouth daily.     buPROPion 150 MG 12 hr tablet  Commonly known as:  WELLBUTRIN SR  Take one tab by mouth every morning & two tabs every evening     cinacalcet 30 MG tablet  Commonly known as:  SENSIPAR  Take 30 mg by mouth daily.     cloNIDine 0.2 mg/24hr patch  Commonly known as:  CATAPRES - Dosed in mg/24 hr  Place 1 patch onto the skin once a week.     escitalopram 20 MG tablet  Commonly known as:  LEXAPRO  Take 20 mg by mouth daily.     furosemide 80 MG tablet  Commonly known as:  LASIX  Take 80 mg by mouth 2 (two) times daily.     hydrALAZINE 100 MG tablet  Commonly known as:  APRESOLINE  Take 100 mg by mouth  2 (two) times daily.     labetalol 100 MG tablet  Commonly known as:  NORMODYNE    Take 50 mg by mouth 2 (two) times daily.     omeprazole 40 MG capsule  Commonly known as:  PRILOSEC  Take 40 mg by mouth daily.     pravastatin 20 MG tablet  Commonly known as:  PRAVACHOL  Take 20 mg by mouth daily.     VITAMIN D PO  Take 1 capsule by mouth daily.          Diet and Activity recommendation: See Discharge Instructions above   Consults obtained  Renal Neurology    Major procedures and Radiology Reports - PLEASE review detailed and final reports for all details, in brief -      Dg Chest 2 View  06/20/2015  CLINICAL DATA:  Status post cerebral infarct 06/19/2015. EXAM: CHEST  2 VIEW COMPARISON:  PA and lateral chest 04/06/2015 and 05/16/2014. FINDINGS: The lungs are clear. Heart size is upper normal. No pneumothorax or pleural effusion. No focal bony abnormality. IMPRESSION: No acute disease. Electronically Signed   By: Inge Rise M.D.   On: 06/20/2015 07:57   Mr Brain Wo Contrast (neuro Protocol)  06/19/2015  CLINICAL DATA:  44 year old male, end-stage renal disease on dialysis with right side numbness x3 days. Right side weakness. Initial encounter. EXAM: MRI HEAD WITHOUT CONTRAST TECHNIQUE: Multiplanar, multiecho pulse sequences of the brain and surrounding structures were obtained without intravenous contrast. COMPARISON:  Head CT 12/22/2006 and earlier. FINDINGS: Scattered left PCA territory small/ lacunar type infarcts including multifocal involvement in the left thalamus, left occipital lobe, and perhaps also the posterior left hippocampus. No superimposed posterior fossa or right hemisphere restricted diffusion. Major intracranial vascular flow voids are preserved. No associated acute hemorrhage or intracranial mass effect. Fairly numerous chronic micro hemorrhages in both thalami, bilateral lentiform nuclei, the pons, and left cerebellar hemispheres. Scattered chronic cerebral white matter T2 and FLAIR hyperintensity. No midline shift, mass effect,  evidence of mass lesion, ventriculomegaly, extra-axial collection or acute intracranial hemorrhage. Cervicomedullary junction and pituitary are within normal limits. Negative visualized cervical spine. Visible internal auditory structures appear normal. Mastoids are clear. Mild maxillary sinus mucosal thickening or mucous retention cyst on the right. Negative orbit and scalp soft tissues. Visualized bone marrow signal is within normal limits. IMPRESSION: 1. Scattered acute mostly lacunar type infarcts in the left PCA territory including involvement of the left thalamus and occipital lobe. 2. No associated mass effect or acute hemorrhage. Scattered chronic microhemorrhages and nonspecific white matter changes compatible with chronic small vessel disease. Electronically Signed   By: Genevie Ann M.D.   On: 06/19/2015 19:56   Mr Cervical Spine Wo Contrast  06/19/2015  CLINICAL DATA:  44 year old male end-stage renal disease on dialysis with right side numbness and weakness for 3 days. Initial encounter. EXAM: MRI CERVICAL SPINE WITHOUT CONTRAST TECHNIQUE: Multiplanar, multisequence MR imaging of the cervical spine was performed. No intravenous contrast was administered. COMPARISON:  Brain MRI from 1910 hours today reported separately. FINDINGS: Study is intermittently degraded by motion artifact despite repeated imaging attempts. Straightening and mild reversal of cervical lordosis. No marrow edema or evidence of acute osseous abnormality. Negative paraspinal soft tissues. Cervicomedullary junction is within normal limits. No cervical spinal cord signal abnormality identified. C2-C3:  Negative. C3-C4:  Negative. C4-C5: Mild left paracentral broad-based disc protrusion (series 9, image 15) narrowing the ventral CSF space without significant spinal or foraminal stenosis. C5-C6: Left paracentral disc  extrusion, with some cephalad and caudal migration of disc. See series 9, image 19. Spinal stenosis with up to mild mass  effect on the left hemi cord. No cord signal abnormality. Up to mild left C6 foraminal stenosis related to foraminal disc bulging. C6-C7: Central disc extrusion which appears somewhat contiguous with that from the level above (series 9, image 23). Spinal stenosis with up to mild mass effect on the ventral spinal cord. No foraminal stenosis. C7-T1:  Mild facet hypertrophy.  No stenosis. No upper thoracic spinal stenosis. IMPRESSION: 1. 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 Associated cervical spinal stenosis with up to mild mass effect on the ventral/left hemicord. No spinal cord signal abnormality. 2. Mild left C6 foraminal stenosis. Electronically Signed   By: Genevie Ann M.D.   On: 06/19/2015 20:25    Micro Results    No results found for this or any previous visit (from the past 240 hour(s)).     Today   Subjective:   Hayden [name] today has no headache,no chest or abdominal pain, feels much better wants to go home today.   Objective:   Blood pressure 173/81, pulse 88, temperature 98.3 F (36.8 C), temperature source Oral, resp. rate 20, height 5\' 10"  (1.778 m), weight 106.1 kg (233 lb 14.5 oz), SpO2 95 %.  No intake or output data in the 24 hours ending 06/21/15 1707  Exam Awake Alert, Oriented x 3, No new F.N deficits, Normal affect [place].AT,PERRAL Supple Neck,No JVD, No cervical lymphadenopathy appriciated.  Symmetrical Chest wall movement, Good air movement bilaterally, CTAB RRR,No Gallops,Rubs or new Murmurs, No Parasternal Heave +ve B.Sounds, Abd Soft, Non tender, No organomegaly appriciated, No rebound -guarding or rigidity. No Cyanosis, Clubbing or edema, No new Rash or bruise  Data Review   CBC w Diff:  Lab Results  Component Value Date   WBC 10.2 06/21/2015   HGB 9.6* 06/21/2015   HCT 30.0* 06/21/2015   PLT 364 06/21/2015   LYMPHOPCT 7* 05/16/2014   MONOPCT 7 05/16/2014   EOSPCT 1 05/16/2014   BASOPCT 0 05/16/2014     CMP:  Lab Results  Component Value Date   NA 138 06/21/2015   K 3.8 06/21/2015   CL 97* 06/21/2015   CO2 29 06/21/2015   BUN 43* 06/21/2015   CREATININE 8.36* 06/21/2015   PROT 8.0 05/16/2014   ALBUMIN 3.6 06/21/2015   BILITOT 1.6* 05/16/2014   ALKPHOS 92 05/16/2014   AST 13 05/16/2014   ALT 14 05/16/2014  .   Total Time in preparing paper work, data evaluation and todays exam - 35 minutes  Lekeshia Kram M.D on 06/21/2015 at 5:07 PM  [name]   Office  [phone]

## 2015-06-21 NOTE — Progress Notes (Signed)
*  PRELIMINARY RESULTS* Vascular Ultrasound Carotid Duplex (Doppler) has been completed.   Findings suggest 1-39% internal carotid artery stenosis bilaterally. Vertebral arteries are patent with antegrade flow.  Transcranial doppler has been completed.  06/21/2015 10:33 AM Maudry Mayhew, RVT, RDCS, RDMS

## 2015-10-20 ENCOUNTER — Ambulatory Visit: Payer: Medicare Other | Admitting: Podiatry

## 2015-11-04 ENCOUNTER — Inpatient Hospital Stay (HOSPITAL_COMMUNITY): Payer: 59

## 2015-11-04 ENCOUNTER — Inpatient Hospital Stay (HOSPITAL_COMMUNITY)
Admission: AD | Admit: 2015-11-04 | Discharge: 2015-11-09 | DRG: 417 | Disposition: A | Discharge status: Home / Self Care | Payer: 59 | Source: Other Acute Inpatient Hospital | Attending: Internal Medicine | Admitting: Internal Medicine

## 2015-11-04 ENCOUNTER — Encounter (HOSPITAL_COMMUNITY): Payer: Self-pay | Admitting: *Deleted

## 2015-11-04 DIAGNOSIS — D62 Acute posthemorrhagic anemia: Secondary | ICD-10-CM | POA: Diagnosis not present

## 2015-11-04 DIAGNOSIS — I5042 Chronic combined systolic (congestive) and diastolic (congestive) heart failure: Secondary | ICD-10-CM | POA: Diagnosis not present

## 2015-11-04 DIAGNOSIS — E785 Hyperlipidemia, unspecified: Secondary | ICD-10-CM | POA: Diagnosis present

## 2015-11-04 DIAGNOSIS — D638 Anemia in other chronic diseases classified elsewhere: Secondary | ICD-10-CM | POA: Diagnosis present

## 2015-11-04 DIAGNOSIS — Z79899 Other long term (current) drug therapy: Secondary | ICD-10-CM | POA: Diagnosis not present

## 2015-11-04 DIAGNOSIS — Z85528 Personal history of other malignant neoplasm of kidney: Secondary | ICD-10-CM | POA: Diagnosis not present

## 2015-11-04 DIAGNOSIS — Z87891 Personal history of nicotine dependence: Secondary | ICD-10-CM | POA: Diagnosis not present

## 2015-11-04 DIAGNOSIS — N186 End stage renal disease: Secondary | ICD-10-CM | POA: Diagnosis present

## 2015-11-04 DIAGNOSIS — F329 Major depressive disorder, single episode, unspecified: Secondary | ICD-10-CM | POA: Diagnosis present

## 2015-11-04 DIAGNOSIS — I6381 Other cerebral infarction due to occlusion or stenosis of small artery: Secondary | ICD-10-CM | POA: Diagnosis present

## 2015-11-04 DIAGNOSIS — K8000 Calculus of gallbladder with acute cholecystitis without obstruction: Secondary | ICD-10-CM | POA: Diagnosis present

## 2015-11-04 DIAGNOSIS — Z8249 Family history of ischemic heart disease and other diseases of the circulatory system: Secondary | ICD-10-CM | POA: Diagnosis not present

## 2015-11-04 DIAGNOSIS — R109 Unspecified abdominal pain: Secondary | ICD-10-CM | POA: Diagnosis present

## 2015-11-04 DIAGNOSIS — K529 Noninfective gastroenteritis and colitis, unspecified: Secondary | ICD-10-CM | POA: Diagnosis present

## 2015-11-04 DIAGNOSIS — Z6833 Body mass index (BMI) 33.0-33.9, adult: Secondary | ICD-10-CM

## 2015-11-04 DIAGNOSIS — Z7982 Long term (current) use of aspirin: Secondary | ICD-10-CM

## 2015-11-04 DIAGNOSIS — N179 Acute kidney failure, unspecified: Secondary | ICD-10-CM | POA: Diagnosis present

## 2015-11-04 DIAGNOSIS — N2581 Secondary hyperparathyroidism of renal origin: Secondary | ICD-10-CM | POA: Diagnosis present

## 2015-11-04 DIAGNOSIS — Z992 Dependence on renal dialysis: Secondary | ICD-10-CM

## 2015-11-04 DIAGNOSIS — K810 Acute cholecystitis: Secondary | ICD-10-CM | POA: Diagnosis not present

## 2015-11-04 DIAGNOSIS — K2980 Duodenitis without bleeding: Secondary | ICD-10-CM | POA: Diagnosis not present

## 2015-11-04 DIAGNOSIS — Z8673 Personal history of transient ischemic attack (TIA), and cerebral infarction without residual deficits: Secondary | ICD-10-CM | POA: Diagnosis not present

## 2015-11-04 DIAGNOSIS — F17210 Nicotine dependence, cigarettes, uncomplicated: Secondary | ICD-10-CM | POA: Diagnosis not present

## 2015-11-04 DIAGNOSIS — E1100 Type 2 diabetes mellitus with hyperosmolarity without nonketotic hyperglycemic-hyperosmolar coma (NKHHC): Secondary | ICD-10-CM | POA: Diagnosis not present

## 2015-11-04 DIAGNOSIS — I10 Essential (primary) hypertension: Secondary | ICD-10-CM | POA: Diagnosis present

## 2015-11-04 DIAGNOSIS — K219 Gastro-esophageal reflux disease without esophagitis: Secondary | ICD-10-CM | POA: Diagnosis present

## 2015-11-04 DIAGNOSIS — Z905 Acquired absence of kidney: Secondary | ICD-10-CM

## 2015-11-04 DIAGNOSIS — J189 Pneumonia, unspecified organism: Secondary | ICD-10-CM | POA: Diagnosis present

## 2015-11-04 DIAGNOSIS — I132 Hypertensive heart and chronic kidney disease with heart failure and with stage 5 chronic kidney disease, or end stage renal disease: Secondary | ICD-10-CM | POA: Diagnosis not present

## 2015-11-04 DIAGNOSIS — E669 Obesity, unspecified: Secondary | ICD-10-CM | POA: Diagnosis present

## 2015-11-04 DIAGNOSIS — R1031 Right lower quadrant pain: Secondary | ICD-10-CM | POA: Diagnosis not present

## 2015-11-04 DIAGNOSIS — E1122 Type 2 diabetes mellitus with diabetic chronic kidney disease: Secondary | ICD-10-CM | POA: Diagnosis present

## 2015-11-04 DIAGNOSIS — E119 Type 2 diabetes mellitus without complications: Secondary | ICD-10-CM

## 2015-11-04 DIAGNOSIS — D649 Anemia, unspecified: Secondary | ICD-10-CM | POA: Diagnosis not present

## 2015-11-04 HISTORY — DX: Cerebral infarction, unspecified: I63.9

## 2015-11-04 LAB — COMPREHENSIVE METABOLIC PANEL
ALK PHOS: 158 U/L — AB (ref 38–126)
ALT: 24 U/L (ref 17–63)
AST: 36 U/L (ref 15–41)
Albumin: 2.5 g/dL — ABNORMAL LOW (ref 3.5–5.0)
Anion gap: 20 — ABNORMAL HIGH (ref 5–15)
BILIRUBIN TOTAL: 1.2 mg/dL (ref 0.3–1.2)
BUN: 66 mg/dL — AB (ref 6–20)
CHLORIDE: 94 mmol/L — AB (ref 101–111)
CO2: 21 mmol/L — ABNORMAL LOW (ref 22–32)
Calcium: 7.7 mg/dL — ABNORMAL LOW (ref 8.9–10.3)
Creatinine, Ser: 9.01 mg/dL — ABNORMAL HIGH (ref 0.61–1.24)
GFR, EST AFRICAN AMERICAN: 7 mL/min — AB (ref >60)
GFR, EST NON AFRICAN AMERICAN: 6 mL/min — AB (ref >60)
Glucose, Bld: 127 mg/dL — ABNORMAL HIGH (ref 65–99)
Potassium: 3.9 mmol/L (ref 3.5–5.1)
Sodium: 135 mmol/L (ref 135–145)
Total Protein: 6.2 g/dL — ABNORMAL LOW (ref 6.5–8.1)

## 2015-11-04 LAB — PROTIME-INR
INR: 1.36 (ref 0.00–1.49)
Prothrombin Time: 16.9 seconds — ABNORMAL HIGH (ref 11.6–15.2)

## 2015-11-04 LAB — HIV ANTIBODY (ROUTINE TESTING W REFLEX): HIV SCREEN 4TH GENERATION: NONREACTIVE

## 2015-11-04 LAB — URINALYSIS, ROUTINE W REFLEX MICROSCOPIC
Bilirubin Urine: NEGATIVE
GLUCOSE, UA: 100 mg/dL — AB
HGB URINE DIPSTICK: NEGATIVE
KETONES UR: NEGATIVE mg/dL
Leukocytes, UA: NEGATIVE
Nitrite: NEGATIVE
Specific Gravity, Urine: 1.019 (ref 1.005–1.030)
pH: 5.5 (ref 5.0–8.0)

## 2015-11-04 LAB — CBC WITH DIFFERENTIAL/PLATELET
BASOS ABS: 0 10*3/uL (ref 0.0–0.1)
BASOS PCT: 0 %
EOS ABS: 0 10*3/uL (ref 0.0–0.7)
EOS PCT: 0 %
HCT: 29.5 % — ABNORMAL LOW (ref 39.0–52.0)
Hemoglobin: 9.5 g/dL — ABNORMAL LOW (ref 13.0–17.0)
Lymphocytes Relative: 3 %
Lymphs Abs: 0.6 10*3/uL — ABNORMAL LOW (ref 0.7–4.0)
MCH: 25.7 pg — ABNORMAL LOW (ref 26.0–34.0)
MCHC: 32.2 g/dL (ref 30.0–36.0)
MCV: 79.7 fL (ref 78.0–100.0)
MONO ABS: 0.7 10*3/uL (ref 0.1–1.0)
Monocytes Relative: 4 %
NEUTROS ABS: 18 10*3/uL — AB (ref 1.7–7.7)
Neutrophils Relative %: 93 %
PLATELETS: 405 10*3/uL — AB (ref 150–400)
RBC: 3.7 MIL/uL — ABNORMAL LOW (ref 4.22–5.81)
RDW: 17.5 % — AB (ref 11.5–15.5)
WBC: 19.4 10*3/uL — ABNORMAL HIGH (ref 4.0–10.5)

## 2015-11-04 LAB — URINE MICROSCOPIC-ADD ON: RBC / HPF: NONE SEEN RBC/hpf (ref 0–5)

## 2015-11-04 LAB — MAGNESIUM: MAGNESIUM: 2.2 mg/dL (ref 1.7–2.4)

## 2015-11-04 LAB — TSH
TSH: 1.304 u[IU]/mL (ref 0.350–4.500)
TSH: 1.162 u[IU]/mL (ref 0.350–4.500)

## 2015-11-04 LAB — BRAIN NATRIURETIC PEPTIDE: B NATRIURETIC PEPTIDE 5: 192 pg/mL — AB (ref 0.0–100.0)

## 2015-11-04 LAB — PHOSPHORUS: Phosphorus: 7.4 mg/dL — ABNORMAL HIGH (ref 2.5–4.6)

## 2015-11-04 LAB — VANCOMYCIN, RANDOM: VANCOMYCIN RM: 23 ug/mL

## 2015-11-04 LAB — APTT: APTT: 37 s (ref 24–37)

## 2015-11-04 MED ORDER — ESCITALOPRAM OXALATE 20 MG PO TABS
20.0000 mg | ORAL_TABLET | Freq: Every day | ORAL | Status: DC
  Administered 2015-11-04 – 2015-11-09 (×5): 20 mg via ORAL
  Filled 2015-11-04 (×6): qty 1

## 2015-11-04 MED ORDER — SODIUM CHLORIDE 0.9% FLUSH
3.0000 mL | Freq: Two times a day (BID) | INTRAVENOUS | Status: DC
  Administered 2015-11-04: 3 mL via INTRAVENOUS

## 2015-11-04 MED ORDER — HYDROCODONE-ACETAMINOPHEN 5-325 MG PO TABS
1.0000 | ORAL_TABLET | Freq: Four times a day (QID) | ORAL | Status: DC | PRN
  Administered 2015-11-04 – 2015-11-06 (×6): 1 via ORAL
  Filled 2015-11-04 (×8): qty 1

## 2015-11-04 MED ORDER — SODIUM CHLORIDE 0.9 % IV SOLN: 250.0000 mL | INTRAVENOUS | Status: DC | PRN

## 2015-11-04 MED ORDER — CALCITRIOL 0.5 MCG PO CAPS
0.5000 ug | ORAL_CAPSULE | Freq: Every day | ORAL | Status: DC
  Administered 2015-11-04 – 2015-11-09 (×5): 0.5 ug via ORAL
  Filled 2015-11-04 (×6): qty 1

## 2015-11-04 MED ORDER — MEPERIDINE HCL 25 MG/ML IJ SOLN: 12.5000 mg | Freq: Once | INTRAMUSCULAR | Status: DC | PRN

## 2015-11-04 MED ORDER — TRAMADOL HCL 50 MG PO TABS
50.0000 mg | ORAL_TABLET | Freq: Four times a day (QID) | ORAL | Status: DC | PRN
Start: 2015-11-04 — End: 2015-11-06
  Administered 2015-11-04 – 2015-11-05 (×3): 50 mg via ORAL
  Filled 2015-11-04 (×3): qty 1

## 2015-11-04 MED ORDER — PRAVASTATIN SODIUM 20 MG PO TABS
20.0000 mg | ORAL_TABLET | Freq: Every day | ORAL | Status: DC
  Administered 2015-11-05 – 2015-11-08 (×3): 20 mg via ORAL
  Filled 2015-11-04 (×3): qty 1

## 2015-11-04 MED ORDER — MEPERIDINE HCL 25 MG/ML IJ SOLN
25.0000 mg | Freq: Once | INTRAMUSCULAR | Status: AC
  Administered 2015-11-04: 25 mg via INTRAVENOUS
  Filled 2015-11-04: qty 1

## 2015-11-04 MED ORDER — DEXTROSE 5 % IV SOLN
500.0000 mg | INTRAVENOUS | Status: DC
  Filled 2015-11-04: qty 500

## 2015-11-04 MED ORDER — SUCRALFATE 1 GM/10ML PO SUSP
1.0000 g | Freq: Three times a day (TID) | ORAL | Status: DC
  Administered 2015-11-04 – 2015-11-05 (×8): 1 g via ORAL
  Filled 2015-11-04 (×8): qty 10

## 2015-11-04 MED ORDER — AMLODIPINE BESYLATE 10 MG PO TABS
10.0000 mg | ORAL_TABLET | Freq: Every day | ORAL | Status: DC
  Administered 2015-11-04 – 2015-11-09 (×5): 10 mg via ORAL
  Filled 2015-11-04: qty 1
  Filled 2015-11-04: qty 2
  Filled 2015-11-04 (×4): qty 1

## 2015-11-04 MED ORDER — FENTANYL CITRATE (PF) 100 MCG/2ML IJ SOLN
50.0000 ug | Freq: Once | INTRAMUSCULAR | Status: AC
  Administered 2015-11-04: 50 ug via INTRAVENOUS
  Filled 2015-11-04: qty 2

## 2015-11-04 MED ORDER — NEPRO/CARBSTEADY PO LIQD
237.0000 mL | Freq: Two times a day (BID) | ORAL | Status: DC
  Administered 2015-11-04 – 2015-11-05 (×3): 237 mL via ORAL

## 2015-11-04 MED ORDER — HEPARIN SODIUM (PORCINE) 5000 UNIT/ML IJ SOLN
5000.0000 [IU] | Freq: Two times a day (BID) | INTRAMUSCULAR | Status: DC
  Administered 2015-11-04 – 2015-11-08 (×8): 5000 [IU] via SUBCUTANEOUS
  Filled 2015-11-04 (×9): qty 1

## 2015-11-04 MED ORDER — ALPRAZOLAM 0.5 MG PO TABS
1.0000 mg | ORAL_TABLET | Freq: Every evening | ORAL | Status: DC | PRN
  Filled 2015-11-04: qty 2

## 2015-11-04 MED ORDER — OXYCODONE HCL 5 MG PO TABS
10.0000 mg | ORAL_TABLET | Freq: Once | ORAL | Status: AC
  Administered 2015-11-04: 10 mg via ORAL
  Filled 2015-11-04: qty 2

## 2015-11-04 MED ORDER — ASPIRIN EC 81 MG PO TBEC
81.0000 mg | DELAYED_RELEASE_TABLET | Freq: Every day | ORAL | Status: DC
  Administered 2015-11-04 – 2015-11-09 (×5): 81 mg via ORAL
  Filled 2015-11-04 (×6): qty 1

## 2015-11-04 MED ORDER — DEXTROSE 5 % IV SOLN
1.0000 g | INTRAVENOUS | Status: DC
  Filled 2015-11-04: qty 10

## 2015-11-04 MED ORDER — LABETALOL HCL 300 MG PO TABS
150.0000 mg | ORAL_TABLET | Freq: Two times a day (BID) | ORAL | Status: DC
  Administered 2015-11-04 – 2015-11-09 (×11): 150 mg via ORAL
  Filled 2015-11-04 (×11): qty 1

## 2015-11-04 MED ORDER — MEPERIDINE HCL 25 MG/ML IJ SOLN: 12.5000 mg | Freq: Four times a day (QID) | INTRAMUSCULAR | Status: DC | PRN

## 2015-11-04 MED ORDER — SODIUM CHLORIDE 0.9% FLUSH: 3.0000 mL | INTRAVENOUS | Status: DC | PRN

## 2015-11-04 MED ORDER — GI COCKTAIL ~~LOC~~: 30.0000 mL | Freq: Once | ORAL | Status: DC

## 2015-11-04 MED ORDER — GI COCKTAIL ~~LOC~~
30.0000 mL | Freq: Three times a day (TID) | ORAL | Status: DC | PRN
  Administered 2015-11-04: 30 mL via ORAL
  Filled 2015-11-04: qty 30

## 2015-11-04 MED ORDER — SODIUM CHLORIDE 0.9% FLUSH
3.0000 mL | Freq: Two times a day (BID) | INTRAVENOUS | Status: DC
  Administered 2015-11-06 – 2015-11-08 (×5): 3 mL via INTRAVENOUS

## 2015-11-04 MED ORDER — PIPERACILLIN-TAZOBACTAM 3.375 G IVPB 30 MIN: 3.3750 g | Freq: Three times a day (TID) | INTRAVENOUS | Status: DC

## 2015-11-04 MED ORDER — PIPERACILLIN-TAZOBACTAM IN DEX 2-0.25 GM/50ML IV SOLN
2.2500 g | Freq: Three times a day (TID) | INTRAVENOUS | Status: DC
  Administered 2015-11-04 – 2015-11-09 (×14): 2.25 g via INTRAVENOUS
  Filled 2015-11-04 (×18): qty 50

## 2015-11-04 MED ORDER — PANTOPRAZOLE SODIUM 40 MG IV SOLR
40.0000 mg | INTRAVENOUS | Status: DC
  Administered 2015-11-04 – 2015-11-05 (×2): 40 mg via INTRAVENOUS
  Filled 2015-11-04 (×2): qty 40

## 2015-11-04 MED ORDER — HYDRALAZINE HCL 50 MG PO TABS
100.0000 mg | ORAL_TABLET | Freq: Two times a day (BID) | ORAL | Status: DC
  Administered 2015-11-04 – 2015-11-09 (×10): 100 mg via ORAL
  Filled 2015-11-04 (×11): qty 2

## 2015-11-04 MED ORDER — BUPROPION HCL ER (SR) 150 MG PO TB12
150.0000 mg | ORAL_TABLET | Freq: Two times a day (BID) | ORAL | Status: DC
  Administered 2015-11-04 – 2015-11-09 (×10): 150 mg via ORAL
  Filled 2015-11-04 (×11): qty 1

## 2015-11-04 MED ORDER — CINACALCET HCL 30 MG PO TABS
60.0000 mg | ORAL_TABLET | Freq: Every day | ORAL | Status: DC
  Administered 2015-11-04 – 2015-11-08 (×4): 60 mg via ORAL
  Filled 2015-11-04 (×5): qty 2

## 2015-11-04 NOTE — Progress Notes (Signed)
Patient will not go to the HD today per Dr. Janne Napoleon, gave all his medications.

## 2015-11-04 NOTE — H&P (Addendum)
[name] History and Physical  DEONDRAE [name] [id number] DOB: [birth year] DOA: 11/04/2015  Referring physician: trx from outside hospital PCP: Louis Meckel, MD   Chief Complaint: pneumonia  HPI: Hayden [name] is a 45 y.o. male with end-stage renal disease on home hemodialysis,  Hypertension, lacunar infarcts in 06/2015, answered spell outside hospital ER [place], [place] [facility] ER) with community-acquired pneumonia. Of note, he stated he had some viral flulike illness, Monday through Wednesday with nonproductive cough, low-grade temperatures 101-102. Started feeling better until Wednesday afternoon started having severe abdominal pain with mild nausea, described as a burning sensation. He had a hard time getting comfortable and finally went to the ER last night around 2:00 am where they had diagnosed him with  Community acquired pneumonia in the right lower lobe. Of note, he said he completed a full session of hemodialysis last night. He currently denies any shortness of breath or chest pains but does have severe abdominal discomfort described as overall intensity.  Outside ER dosed him w/ Rocephin, azithromycin, Zosyn as well as vancomycin prior to transfer.  Outside vs: 132/76  t 97.2  Hr 93  rr 16 92% ra   Review of Systems:  Constitutional: No weight loss, night sweats, + Fevers, chills, fatigue [name].  HEENT: No headaches, Difficulty swallowing,Tooth/dental problems,Sore throat,  No sneezing, itching, ear ache, nasal congestion, post nasal drip,  CV:  No chest pain, Orthopnea, PND, swelling in lower extremities, anasarca, dizziness, palpitations  GI: No  nausea, vomiting, diarrhea, change in bowel habits, loss of appetite . + heartburn, indigestion, diffuse abdominal pain, "raw" Resp: No shortness of breath with exertion or at rest. No excess mucus, no productive cough, No non-productive cough, No coughing up of blood.No change in color of mucus.No wheezing.No chest  wall deformity  Skin: no rash or lesions.  GU: no dysuria, change in color of urine, no urgency or frequency. No flank pain.  Musculoskeletal: No joint pain or swelling. No decreased range of motion. No back pain.   Myalgias M- W, better, but now hard time getting comfortable due to abd discomfort Psych: No change in mood or affect. No depression or anxiety. No memory loss.   Past Medical History  Diagnosis Date  . History of kidney cancer   . Hypertension   . Diabetes mellitus   . CHF (congestive heart failure) ([place]) [id number]    Acute systolic and diastolic CHF  . Renal disorder   . Obesity   . Hyperlipidemia   . Anemia March 2014  . Cancer ([name])     Kidney  . PONV (postoperative nausea and vomiting)   . Depression     & rage --  was in counseling....great now   Past Surgical History  Procedure Laterality Date  . Nephrectomy Right 2008    partial  . Testicle torsion reduction    . Hernia repair    . Tonsillectomy and adenoidectomy    . Av fistula placement Left 01/26/2013    Procedure: ARTERIOVENOUS (AV) FISTULA CREATION - LEFT RADIAL CEPHALIC AVF;  Surgeon: Angelia Mould, MD;  Location: [facility] OR;  Service: Vascular;  Laterality: Left;   Social History:  reports that he quit smoking about 2 years ago. He has never used smokeless tobacco. He reports that he drinks alcohol. He reports that he uses illicit drugs (Marijuana).  Allergies  Allergen Reactions  . Dilaudid [Hydromorphone Hcl] Other (See Comments)    Abnormal behavior  . Morphine And Related Itching    Family  History  Problem Relation Age of Onset  . Heart disease Mother     Heart Disease before age 37  . Deep vein thrombosis Father   . Heart attack Father     Prior to Admission medications   Medication Sig Start Date End Date Taking? Authorizing Provider  amLODipine (NORVASC) 10 MG tablet Take 10 mg by mouth daily.   Yes Historical Provider, MD  aspirin EC 81 MG tablet Take 81 mg by mouth daily.   Yes  Historical Provider, MD  buPROPion (WELLBUTRIN SR) 150 MG 12 hr tablet Take one tab by mouth every morning & two tabs every evening   Yes Historical Provider, MD  calcitRIOL (ROCALTROL) 0.5 MCG capsule Take 0.5 mcg by mouth daily.   Yes Historical Provider, MD  cinacalcet (SENSIPAR) 30 MG tablet Take 60 mg by mouth daily.    Yes Historical Provider, MD  cloNIDine (CATAPRES - DOSED IN MG/24 HR) 0.2 mg/24hr patch Place 1 patch onto the skin once a week.   Yes Historical Provider, MD  escitalopram (LEXAPRO) 20 MG tablet Take 20 mg by mouth daily.   Yes Historical Provider, MD  furosemide (LASIX) 80 MG tablet Take 80 mg by mouth 2 (two) times daily.   Yes Historical Provider, MD  hydrALAZINE (APRESOLINE) 100 MG tablet Take 100 mg by mouth 2 (two) times daily.   Yes Historical Provider, MD  labetalol (NORMODYNE) 100 MG tablet Take 150 mg by mouth 2 (two) times daily.    Yes Historical Provider, MD  omeprazole (PRILOSEC) 40 MG capsule Take 40 mg by mouth daily.   Yes Historical Provider, MD  pravastatin (PRAVACHOL) 20 MG tablet Take 20 mg by mouth daily.   Yes Historical Provider, MD  VANCOMYCIN HCL IV Inject 1,000 mg into the vein. 1gm started  at 0320 and stopped at [place] per Deb (RN) at [facility]   Yes Historical Provider, MD  ALPRAZolam Duanne Moron) 1 MG tablet Take 1 mg by mouth at bedtime as needed for anxiety.    Historical Provider, MD  Cholecalciferol (VITAMIN D PO) Take 1 capsule by mouth daily.    Historical Provider, MD   Physical Exam: There were no vitals filed for this visit.  Wt Readings from Last 3 Encounters:  06/20/15 106.1 kg (233 lb 14.5 oz)  05/16/14 108.863 kg (240 lb)  11/30/13 107.956 kg (238 lb)    General:  Appears calm and comfortable, pleasant, NAD, AAOx3, 2L [place], mild discomfort from abd pain Eyes: PERRL, normal lids, irises & conjunctiva ENT: grossly normal hearing, dry mm Neck: no LAD, masses or thyromegaly Cardiovascular: RRR, no m/r/g. No LE edema. Telemetry:  SR, no arrhythmias  Respiratory: diminished  Diffusely,  no w/r/r. Normal respiratory effort. Abdomen: +hypoactive bs, soft, diffuse ttp, no palpable masses appreciated, no g/r. Skin: no rash or induration seen on limited exam Musculoskeletal: grossly normal tone BUE/BLE Psychiatric: grossly normal mood and affect, speech fluent and appropriate Neurologic: grossly non-focal.          Labs on Admission:  Basic Metabolic Panel: No results for input(s): NA, K, CL, CO2, GLUCOSE, BUN, CREATININE, CALCIUM, MG, PHOS in the last 168 hours. Liver Function Tests: No results for input(s): AST, ALT, ALKPHOS, BILITOT, PROT, ALBUMIN in the last 168 hours. No results for input(s): LIPASE, AMYLASE in the last 168 hours. No results for input(s): AMMONIA in the last 168 hours. CBC: No results for input(s): WBC, NEUTROABS, HGB, HCT, MCV, PLT in the last 168 hours. Cardiac Enzymes: No  results for input(s): CKTOTAL, CKMB, CKMBINDEX, TROPONINI in the last 168 hours.  BNP (last 3 results) No results for input(s): BNP in the last 8760 hours.  ProBNP (last 3 results) No results for input(s): PROBNP in the last 8760 hours.  CBG: No results for input(s): GLUCAP in the last 168 hours.  Radiological Exams on Admission: No results found.  EKG: outside er 139am 11/04/15  nsr 90bpm, nad, lvh.  Outside labs/rads cxr 11/04/15  Posterior rll pna/consolidation  Lactic acid 0.8 Cbc wbc 18.7; h/h 10.9/33.6  plts 407 Chem na 136, k 3.6 , cr 8.26  Echo 06/21/15 LV EF: 55% - 60%  ------------------------------------------------------------------- Indications: CVA 436.  ------------------------------------------------------------------- History: PMH: Congestive heart failure. Risk factors: Hypertension. Diabetes mellitus. Obese.  ------------------------------------------------------------------- Study Conclusions  - Left ventricle: The cavity size was normal. There was moderate   concentric hypertrophy. Systolic function was normal. The  estimated ejection fraction was in the range of 55% to 60%. Wall  motion was normal; there were no regional wall motion  abnormalities. Features are consistent with a pseudonormal left  ventricular filling pattern, with concomitant abnormal relaxation  and increased filling pressure (grade 2 diastolic dysfunction). - Left atrium: The atrium was mildly dilated.  Assessment/Plan Active Problems:   Pneumonia involving right lung   Acute kidney injury ([place])   History of partial Right nephrectomy for renal mass (2008)   End stage renal disease on dialysis [facility])   Lacunar infarct, acute (HCC)   HTN (hypertension)   Gastroenteritis    1. Pna, posterior RLL, concern for HCAP; patient's past medical history concerns for healthcare acquired pneumonia is high. We will admit to telemetry. Patient was pancultured in outside ER. We will check labs now. We'll continue empiric  Zosyn and vancomycin renally dosed while here.  Check CXR 2v.  2. Gastroenteritis, suspect viral: chk kub, We'll start him on Protonix IV as well as Carafate suspension 4 times a day Will also start GI cocktail when necessary and he can tolerate Vicodin when necessary as well. Sensation states he's taking Vicodin before without issues.  3.  Acute kidney injury on end-stage renal disease on hemodialysis Monday through Friday with left upper extremity fistula. We'll check labs now also consulted nephrology on call, Dr. Barry Dienes, and he will assistance, appreciated his assistance.  4.  History of lacunar infarcts and December 2016.   5.  hypertension, controlled at this time. Watch for hypotension. Given his acute medical illnesses, will cautiously start home meds.  Consults: d/w Dr Jonnie Finner, renal,  appreciate assistance, will not see this weekend, will do HD Monday if still here.  Code Status: Full DVT Prophylaxis: hep [place] 5k bid Family Communication: patient and  son at bedside. Disposition Plan: at least 2 days  Time spent: 79mins  Maren Reamer MD., MBA/MHA [name] Pager [phone]   inpt labs reviewed:  Wbc 19.4  H/h 9.5/29  plts 405 Na 135, k 3.9, cr 9.01 lfts unremarkable  CLINICAL DATA: Two day history of abdominal pain  EXAM: ABDOMEN - 1 VIEW  COMPARISON: CT abdomen and pelvis May 16, 2014  FINDINGS: There is no appreciable bowel dilatation or air-fluid level suggesting obstruction. No free air is seen on this supine examination. There are vascular calcifications in the pelvis.  IMPRESSION: No demonstrable bowel obstruction or free air.   Electronically Signed  By: Lowella Grip III M.D.  On: 11/04/2015 10:13   - EXAM: CHEST 2 VIEW  COMPARISON: 06/20/2015  FINDINGS: Focal airspace disease in  the right lower lobe which is new from prior. No cavitation or effusion. Generous heart size without definitive cardiomegaly. Stable aortic and hilar contours.  IMPRESSION: Right lower lobe pneumonia. Followup PA and lateral chest X-ray is recommended in 3-4 weeks following trial of antibiotic therapy to ensure resolution.

## 2015-11-04 NOTE — Progress Notes (Signed)
Admission note:  Arrival Method: Patient arrived via [facility]link on a stretcher accompanied by the staff. Mental Orientation:  Alert and oriented x 4. Telemetry: 6E-20, NSR, CCMD notified. Assessment: See doc flow sheets. Skin: No open areas or any bruises noted but tattoos noted on the back, bilateral arms.  Assessed by two nurses (Zenab). IV: Right AC, clean, dry and intact, saline locked. Pain: 6/10, given Norco. Tubes: N/A Safety Measures: Low bed, call bell and phone within reach.  Fall Prevention Safety Plan: reviewed the plan, understood and acknowledged. Admission Screening: In progress. 6700 Orientation: Patient has been oriented to the unit, staff and to the room.

## 2015-11-04 NOTE — Progress Notes (Signed)
Patient c/o abdominal pain,states vicodin and demerol given earlier did not work. Text paged MD on call.Will continue to monitor. [facility], Wonda Cheng, Therapist, sports

## 2015-11-04 NOTE — Progress Notes (Signed)
[name] History and Physical  Hayden [name] [id number] DOB: 1970-09-06 DOA: 11/04/2015  Referring physician: transfer PCP: Louis Meckel, MD (nephro)  Chief Complaint: pneumonia  HPI: Hayden [name] is a 45 y.o. male with significant past medical history of end-stage renal disease on home hemodialysis Monday through Friday via his left upper extremity fistula, as well as history of prior right nephrectomy for renal cancer, hypertension and TIA about 6 months ago presents to outside ER [facility], [place]) secondary to acute severe abdominal pains.  Patient was normotensive in outside ER and found to have right lobe  pneumonia and transferred to our facility for further evaluation. Of note, he complained initially of flu-like illness on Monday with minimal cough, diffuse malagias, as well as fevers ranging 101-102 on Monday through Wednesday. His cough resolved. She was feeling better.  However, Wednesday night he started having severe abdominal pains which worsened and unable to get comfortable and finally went to the ER last night at 2am.  Patient states he's had decreased by mouth intake and tried to force his emesis yesterday with very minimal output of note, he did complete a full session of dialysis last night before going to the ER.  Pt denies any sick contacts.    Patient received a dose of Rocephin, azithromycin, zosyn, vancomycin prior to transfer to our facility.   Pt's son here w/ him in room.  Outside ER vs: 132/76  t 97.2  Hr 93  rr16  92% RA   Review of Systems:  Constitutional: No weight loss, night sweats,  chills, fatigue.  +Fevers M- W  101-102, resolved HEENT: No headaches, Difficulty swallowing,Tooth/dental problems,Sore throat,  No sneezing, itching, ear ache, nasal congestion, post nasal drip,  CV:  No chest pain, Orthopnea, PND, swelling in lower extremities, anasarca, dizziness, palpitations  GI:  Neg  nausea, vomiting, diarrhea,  change in bowel habits, loss of appetite ; +  indigestion, abdominal pain diffusely, "burning/raw feeling" Resp: No shortness of breath with exertion or at rest. No excess mucus, no productive cough, No non-productive cough, No coughing up of blood.No change in color of mucus.No wheezing.No chest wall deformity  Skin: no rash or lesions.  GU: no dysuria, change in color of urine, no urgency or frequency. No flank pain.  Musculoskeletal: No joint pain or swelling. No decreased range of motion. No back pain.   +diffuse myalgias M- W - better, Now unable to get comfortable due to abd discomfort Psych: No change in mood or affect. No depression or anxiety. No memory loss.   Past Medical History  Diagnosis Date  . History of kidney cancer   . Hypertension   . Diabetes mellitus   . CHF (congestive heart failure) ([place]) [id number]    Acute systolic and diastolic CHF  . Renal disorder   . Obesity   . Hyperlipidemia   . Anemia March 2014  . Cancer ([place])     Kidney  . PONV (postoperative nausea and vomiting)   . Depression     & rage --  was in counseling....great now   Past Surgical History  Procedure Laterality Date  . Nephrectomy Right 2008    partial  . Testicle torsion reduction    . Hernia repair    . Tonsillectomy and adenoidectomy    . Av fistula placement Left 01/26/2013    Procedure: ARTERIOVENOUS (AV) FISTULA CREATION - LEFT RADIAL CEPHALIC AVF;  Surgeon: Angelia Mould, MD;  Location: [place];  Service:  Vascular;  Laterality: Left;   Social History:  reports that he quit smoking about 2 years ago. He has never used smokeless tobacco. He reports that he drinks alcohol. He reports that he uses illicit drugs (Marijuana).  Allergies  Allergen Reactions  . Dilaudid [Hydromorphone Hcl] Other (See Comments)    Abnormal behavior  . Morphine And Related Itching    Family History  Problem Relation Age of Onset  . Heart disease Mother     Heart Disease before age 89  . Deep vein  thrombosis Father   . Heart attack Father     Prior to Admission medications   Medication Sig Start Date End Date Taking? Authorizing Provider  ALPRAZolam Duanne Moron) 1 MG tablet Take 1 mg by mouth at bedtime as needed for anxiety.    Historical Provider, MD  amLODipine (NORVASC) 10 MG tablet Take 10 mg by mouth daily.    Historical Provider, MD  aspirin EC 81 MG tablet Take 81 mg by mouth daily.    Historical Provider, MD  buPROPion (WELLBUTRIN SR) 150 MG 12 hr tablet Take one tab by mouth every morning & two tabs every evening    Historical Provider, MD  Cholecalciferol (VITAMIN D PO) Take 1 capsule by mouth daily.    Historical Provider, MD  cinacalcet (SENSIPAR) 30 MG tablet Take 30 mg by mouth daily.    Historical Provider, MD  cloNIDine (CATAPRES - DOSED IN MG/24 HR) 0.2 mg/24hr patch Place 1 patch onto the skin once a week.    Historical Provider, MD  escitalopram (LEXAPRO) 20 MG tablet Take 20 mg by mouth daily.    Historical Provider, MD  furosemide (LASIX) 80 MG tablet Take 80 mg by mouth 2 (two) times daily.    Historical Provider, MD  hydrALAZINE (APRESOLINE) 100 MG tablet Take 100 mg by mouth 2 (two) times daily.    Historical Provider, MD  labetalol (NORMODYNE) 100 MG tablet Take 50 mg by mouth 2 (two) times daily.     Historical Provider, MD  omeprazole (PRILOSEC) 40 MG capsule Take 40 mg by mouth daily.    Historical Provider, MD  pravastatin (PRAVACHOL) 20 MG tablet Take 20 mg by mouth daily.    Historical Provider, MD   Physical Exam: There were no vitals filed for this visit.  Wt Readings from Last 3 Encounters:  06/20/15 106.1 kg (233 lb 14.5 oz)  05/16/14 108.863 kg (240 lb)  11/30/13 107.956 kg (238 lb)    General:  Appears calm and comfortable, pleasant, NAD, AAOx3, + 2 l [place], +abd discomfort Eyes: PERRL, normal lids, irises & conjunctiva ENT: grossly normal hearing, dry mm. Neck: no LAD, masses or thyromegaly Cardiovascular: RRR, no m/r/g. No LE edema. Telemetry:  SR, no arrhythmias  Respiratory: diminished diffusely, but no no w/r/r. Normal respiratory effort. Abdomen:  +hypoactive BS, diffusely ttp, no g/r, no palpable masses Skin: no rash or induration seen on limited exam Musculoskeletal: grossly normal tone BUE/BLE Psychiatric: grossly normal mood and affect, speech fluent and appropriate Neurologic: grossly non-focal.          Labs on Admission:  Basic Metabolic Panel: No results for input(s): NA, K, CL, CO2, GLUCOSE, BUN, CREATININE, CALCIUM, MG, PHOS in the last 168 hours. Liver Function Tests: No results for input(s): AST, ALT, ALKPHOS, BILITOT, PROT, ALBUMIN in the last 168 hours. No results for input(s): LIPASE, AMYLASE in the last 168 hours. No results for input(s): AMMONIA in the last 168 hours. CBC: No results for input(s):  WBC, NEUTROABS, HGB, HCT, MCV, PLT in the last 168 hours. Cardiac Enzymes: No results for input(s): CKTOTAL, CKMB, CKMBINDEX, TROPONINI in the last 168 hours.  BNP (last 3 results) No results for input(s): BNP in the last 8760 hours.  ProBNP (last 3 results) No results for input(s): PROBNP in the last 8760 hours.  CBG: No results for input(s): GLUCAP in the last 168 hours.  Radiological Exams on Admission: No results found.  EKG: Independently reviewed by me.  Outside labs/rads 11/04/15 cxr 2 v: posterior RLL consolidation/pneumonia  Cbc: wbc 18.7   H/h  10.9/33.6 plt 407  cmp  Na 136, na 3.6 Cr 8.26  Echo 06/21/15 LV EF: 55% - 60%  ------------------------------------------------------------------- Indications: CVA 436.  ------------------------------------------------------------------- History: PMH: Congestive heart failure. Risk factors: Hypertension. Diabetes mellitus. Obese.  ------------------------------------------------------------------- Study Conclusions  - Left ventricle: The cavity size was normal. There was moderate  concentric hypertrophy. Systolic  function was normal. The  estimated ejection fraction was in the range of 55% to 60%. Wall  motion was normal; there were no regional wall motion  abnormalities. Features are consistent with a pseudonormal left  ventricular filling pattern, with concomitant abnormal relaxation  and increased filling pressure (grade 2 diastolic dysfunction). - Left atrium: The atrium was mildly dilated.   Assessment/Plan Active Problems:   Pneumonia involving right lung   Acute kidney injury ([place])   History of partial Right nephrectomy for renal mass (2008)   End stage renal disease (HCC)   End stage renal disease on dialysis (HCC)   HTN (hypertension)   1. CAP, RLL, concern for HCAP: Admitted to inpatient service pancultured on outside ER. We'll follow-up with results. Concern for healthcare associated pneumonia given his past medical history.  Patient received Rocephin, azithromycin, Zosyn is value as well as vancomycin and outside ER. We will continue Zosyn and vancomycin renally dosed while here.  Will check labs now.  Will order cxr 2v. 2. aki on ESRD, Home HD M- F; elevated creatinine. Outside ER. We'll check labs now.  Also consult nephrology for concerns for possible dialysis for this weekend.   3. htn - appears controlled. Will review his home meds. Watch for hypotension. given his an infectious process. 4. abd pain, likely gastroenteritis - started him on a Protonix drip, also Carafate suspension 4 times a day,  also started him on a GI cocktail when necessary.  If tolerates Vicodin when necessary for pain. Patient states he is able to take Vicodin.  Will chk kub. 5. Hx of prior R nephrectomy, partial per Pt.   6.  Pending [facility] at [facility]. 7. 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   Consults Place: Renal, Dr Jonnie Finner - d/w via phone, formal consult, will see.  Appreciate assistance.    Code Status:  FULL DVT Prophylaxis: hep [place] 5k bid Family Communication: son/pt at bedside Disposition Plan: at least 2 days  Time spent: 64mins  Maren Reamer MD., MBA/MHA [name] Pager [phone]

## 2015-11-04 NOTE — Progress Notes (Addendum)
Pharmacy Antibiotic Note  Hayden [name] is a 45 y.o. male admitted on 11/04/2015 with pneumonia.  Pharmacy has been consulted for vancomycin dosing. Pharmacy also consulted to adjust doses of antibiotics. I will adjust Zosyn for his ESRD.  Per [place] [facility] from where pt was transferred from, last vanc dose was this morning 5/6 at 0330. VR checked this AM and was within range at 23. Pt does not need any more vanc right now.  Will redose when we figure out when he is going to HD.  Plan: - No more vanc doses until HD - Change Zosyn to 2.25 gm IV q8h for HD dose - F/u cultures     Temp (24hrs), Avg:98.2 F (36.8 C), Min:98.2 F (36.8 C), Max:98.2 F (36.8 C)   Recent Labs Lab 11/04/15 0837 11/04/15 0856  WBC 19.4*  --   CREATININE 9.01*  --   VANCORANDOM  --  23    CrCl cannot be calculated (Unknown ideal weight.).    Allergies  Allergen Reactions  . Dilaudid [Hydromorphone Hcl] Other (See Comments)    Abnormal behavior  . Morphine And Related Itching    Antimicrobials this admission: Zosyn 5/6 >> Vanc 5/6 >>  Dose adjustments this admission: 5/6 VR 23  Microbiology results: 5/6 BCx: sent 5/6 MRSA PCR: negative  Thank you for allowing pharmacy to be a part of this patient's care.  Wanna Gully L. Nicole Kindred, PharmD PGY2 Infectious Diseases Pharmacy Resident Pager: [phone] 11/04/2015 12:21 PM

## 2015-11-05 ENCOUNTER — Inpatient Hospital Stay (HOSPITAL_COMMUNITY): Payer: 59

## 2015-11-05 DIAGNOSIS — I10 Essential (primary) hypertension: Secondary | ICD-10-CM

## 2015-11-05 DIAGNOSIS — R1031 Right lower quadrant pain: Secondary | ICD-10-CM | POA: Insufficient documentation

## 2015-11-05 LAB — BASIC METABOLIC PANEL
ANION GAP: 23 — AB (ref 5–15)
BUN: 86 mg/dL — ABNORMAL HIGH (ref 6–20)
CALCIUM: 7.4 mg/dL — AB (ref 8.9–10.3)
CO2: 21 mmol/L — ABNORMAL LOW (ref 22–32)
CREATININE: 10.95 mg/dL — AB (ref 0.61–1.24)
Chloride: 90 mmol/L — ABNORMAL LOW (ref 101–111)
GFR, EST AFRICAN AMERICAN: 6 mL/min — AB (ref >60)
GFR, EST NON AFRICAN AMERICAN: 5 mL/min — AB (ref >60)
GLUCOSE: 113 mg/dL — AB (ref 65–99)
Potassium: 4.2 mmol/L (ref 3.5–5.1)
Sodium: 134 mmol/L — ABNORMAL LOW (ref 135–145)

## 2015-11-05 LAB — CBC
HCT: 29 % — ABNORMAL LOW (ref 39.0–52.0)
HEMOGLOBIN: 9.6 g/dL — AB (ref 13.0–17.0)
MCH: 26.4 pg (ref 26.0–34.0)
MCHC: 33.1 g/dL (ref 30.0–36.0)
MCV: 79.9 fL (ref 78.0–100.0)
PLATELETS: 468 10*3/uL — AB (ref 150–400)
RBC: 3.63 MIL/uL — ABNORMAL LOW (ref 4.22–5.81)
RDW: 17.6 % — ABNORMAL HIGH (ref 11.5–15.5)
WBC: 20.1 10*3/uL — ABNORMAL HIGH (ref 4.0–10.5)

## 2015-11-05 LAB — STREP PNEUMONIAE URINARY ANTIGEN: Strep Pneumo Urinary Antigen: NEGATIVE

## 2015-11-05 MED ORDER — DEXTROSE 5 % IV SOLN
500.0000 mg | INTRAVENOUS | Status: DC
  Administered 2015-11-05: 500 mg via INTRAVENOUS
  Filled 2015-11-05 (×3): qty 500

## 2015-11-05 MED ORDER — PANTOPRAZOLE SODIUM 40 MG PO TBEC
40.0000 mg | DELAYED_RELEASE_TABLET | Freq: Every day | ORAL | Status: DC
  Administered 2015-11-07 – 2015-11-09 (×3): 40 mg via ORAL
  Filled 2015-11-05 (×4): qty 1

## 2015-11-05 MED ORDER — BARIUM SULFATE 2.1 % PO SUSP
ORAL | Status: AC
  Filled 2015-11-05: qty 25

## 2015-11-05 NOTE — Evaluation (Signed)
Physical Therapy Evaluation Patient Details Name: Hayden [name] MRN: [id number] DOB: March 27, 1971 Today's Date: 11/05/2015   History of Present Illness  Patient is a 45 yo male admitted 11/04/15 with abdominal pain and recent viral illness.  Patient with RLL pna.     PMH:  ESRD on HD M, W, F, hypertension, CVA (lacunar infarct) in 2016  Clinical Impression  Patient presents with problems listed below.  Will benefit from acute PT to maximize functional independence prior to discharge home with family.  Do not anticipate any f/u PT needs at d/c.  Encouraged patient to be OOB and upright as much as possible.    Follow Up Recommendations No PT follow up;Supervision for mobility/OOB    Equipment Recommendations  None recommended by PT    Recommendations for Other Services       Precautions / Restrictions Precautions Precautions: None Restrictions Weight Bearing Restrictions: No      Mobility  Bed Mobility Overal bed mobility: Needs Assistance Bed Mobility: Supine to Sit;Sit to Supine     Supine to sit: Min assist Sit to supine: Min guard   General bed mobility comments: Assist to raise trunk to sitting position.  Transfers Overall transfer level: Needs assistance Equipment used: None Transfers: Sit to/from Stand Sit to Stand: Supervision         General transfer comment: Supervision for safety.  Slightly unsteady in stance.  Ambulation/Gait Ambulation/Gait assistance: Min guard Ambulation Distance (Feet): 90 Feet Assistive device: None Gait Pattern/deviations: Step-through pattern;Decreased step length - right;Decreased step length - left;Decreased stride length Gait velocity: decreased Gait velocity interpretation: Below normal speed for age/gender General Gait Details: Patient with slow, guarded gait.  Slightly unsteady.  Assist for safety.  Stairs            Wheelchair Mobility    Modified Rankin (Stroke Patients Only)       Balance Overall balance  assessment: Needs assistance         Standing balance support: No upper extremity supported Standing balance-Leahy Scale: Good                               Pertinent Vitals/Pain Pain Assessment: 0-10 Pain Score: 8  Pain Location: Lower abdomen Pain Descriptors / Indicators: Aching;Sore Pain Intervention(s): Monitored during session;Repositioned;Patient requesting pain meds-RN notified    Home Living Family/patient expects to be discharged to:: Private residence Living Arrangements: Spouse/significant other;Children Available Help at Discharge: Family;Available 24 hours/day Type of Home: Mobile home Home Access: Stairs to enter Entrance Stairs-Rails: Right;Left;Can reach both Entrance Stairs-Number of Steps: 4 Home Layout: One level Home Equipment: Cane - single point;Grab bars - tub/shower;Hand held shower head      Prior Function Level of Independence: Independent         Comments: Driving not currently working.  Wife assists patient with home HD.     Hand Dominance   Dominant Hand: Right    Extremity/Trunk Assessment   Upper Extremity Assessment: Generalized weakness (shakiness noted with movement)           Lower Extremity Assessment: Generalized weakness (shakiness noted with movement)         Communication   Communication: No difficulties  Cognition Arousal/Alertness: Awake/alert Behavior During Therapy: WFL for tasks assessed/performed Overall Cognitive Status: Within Functional Limits for tasks assessed                      General Comments  Exercises        Assessment/Plan    PT Assessment Patient needs continued PT services  PT Diagnosis Difficulty walking;Generalized weakness;Acute pain   PT Problem List Decreased strength;Decreased activity tolerance;Decreased balance;Decreased mobility;Cardiopulmonary status limiting activity;Obesity;Pain  PT Treatment Interventions DME instruction;Gait  training;Functional mobility training;Therapeutic activities;Therapeutic exercise;Balance training;Patient/family education   PT Goals (Current goals can be found in the Care Plan section) Acute Rehab PT Goals Patient Stated Goal: To get stronger PT Goal Formulation: With patient Time For Goal Achievement: 11/12/15 Potential to Achieve Goals: Good    Frequency Min 3X/week   Barriers to discharge        Co-evaluation               End of Session Equipment Utilized During Treatment: Gait belt;Oxygen Activity Tolerance: Patient limited by fatigue;Patient limited by pain Patient left: in bed;with call bell/phone within reach;with family/visitor present Nurse Communication: Mobility status;Patient requests pain meds         Time: 1220-1236 PT Time Calculation (min) (ACUTE ONLY): 16 min   Charges:   PT Evaluation $PT Eval Moderate Complexity: 1 Procedure     PT G Codes:        Despina Pole 2015/12/05, 1:18 PM Carita Pian. Sanjuana Kava, [place] Pager [phone]

## 2015-11-05 NOTE — Progress Notes (Signed)
Patient ID: Hayden [name], male   DOB: May 26, 1971, 45 y.o.   MRN: [id number]                                                                PROGRESS NOTE                                                                                                                                                                                                             Patient Demographics:    Hayden [name], is a 45 y.o. male, DOB - [birth year], [id number]  Admit date - 11/04/2015   Admitting Physician Maren Reamer, MD  Outpatient Primary MD for the patient is Louis Meckel, MD  LOS - 1  Outpatient Specialists:nephrology [place]  No chief complaint on file.      Brief Narrative  45 yo male with ESRD on HD M, W, F, hypertension, CVA (lacunar infarct) in 2016, apparently had some viral type illness and Monday -Wdnesday, and then developed cough and fever and then developed some lower abdominal discomfort and mild nausea and presented to ED for evaluation.  Pt was diagnosed with Pneumonia of the RLL.     Subjective:    Hayden [name]  Continues to complain  Bilateral lower abd pain.  Denies fever, chills, cp, palp, sob, n/v, diarrhea, brbpr, black stool.  No cough this am.  Pt feeling better other than this nagging abdominal pain.   CAP:  Pt is currently on vanco, zosyn.  No cough this am. Afebrile RLQ pain: see above      Assessment  & Plan :    Principal Problem:   Pneumonia involving right lung Active Problems:   Acute kidney injury ([place])   History of partial Right nephrectomy for renal mass (2008)   End stage renal disease on dialysis ([place])   Lacunar infarct, acute ([facility])   HTN (hypertension)   Gastroenteritis   1. Abdominal pain Check CT scan r/o appendicitis , diverticulitis  2. Pneumonia Cont vanco, zosy, add zithromax to cover atypicals  3.  ESRD on HD, M, W, F Last HD Friday,  Dr. Melvia Heaps consulted but will not see this weekend,  Will do HD on Monday  4.  Hypertension Cont current medications.      Code Status : FULL CODE Disposition  Plan  : home  Consults  :  Nephrology  Procedures  : CT scan pending  DVT Prophylaxis  :  Heparin [place] [facility]  Lab Results  Component Value Date   PLT 468* 11/05/2015    Antibiotics  :  On vanco, zosyn  Anti-infectives    Start     Dose/Rate Route Frequency Ordered Stop   11/04/15 1000  piperacillin-tazobactam (ZOSYN) IVPB 2.25 g     2.25 g 100 mL/hr over 30 Minutes Intravenous Every 8 hours 11/04/15 0902     11/04/15 0900  cefTRIAXone (ROCEPHIN) 1 g in dextrose 5 % 50 mL IVPB  Status:  Discontinued    Comments:  From time of last dose outside hospital   1 g 100 mL/hr over 30 Minutes Intravenous Every 24 hours 11/04/15 0811 11/04/15 0844   11/04/15 0900  azithromycin (ZITHROMAX) 500 mg in dextrose 5 % 250 mL IVPB  Status:  Discontinued    Comments:  From time of last dose outside hospital   500 mg 250 mL/hr over 60 Minutes Intravenous Every 24 hours 11/04/15 0811 11/04/15 0844   11/04/15 0845  piperacillin-tazobactam (ZOSYN) IVPB 3.375 g  Status:  Discontinued    Comments:  From time received ouside er/ 11/04/15  At 315am   3.375 g 100 mL/hr over 30 Minutes Intravenous Every 8 hours 11/04/15 0844 11/04/15 0902        Objective:   Filed Vitals:   11/05/15 0443 11/05/15 0445 11/05/15 0556 11/05/15 0903  BP: 149/68  151/71 135/68  Pulse: 89  89 86  Temp: 97.6 F (36.4 C)  98.1 F (36.7 C) 97.8 F (36.6 C)  TempSrc: Oral  Oral Oral  Resp: 20  20   SpO2: 90% 94% 95% 93%    Wt Readings from Last 3 Encounters:  06/20/15 106.1 kg (233 lb 14.5 oz)  05/16/14 108.863 kg (240 lb)  11/30/13 107.956 kg (238 lb)     Intake/Output Summary (Last 24 hours) at 11/05/15 1129 Last data filed at 11/05/15 0842  Gross per 24 hour  Intake    370 ml  Output    100 ml  Net    270 ml     Physical Exam  Awake Alert, Oriented X 3, No new F.N deficits, Normal affect Heent: anicteric Supple Neck,No JVD,  No cervical lymphadenopathy appriciated.  Symmetrical Chest wall movement, Good air movement bilaterally, slight crackle right lower lung Heart: RRR,No Gallops,Rubs or new Murmurs, No Parasternal Heave Abd Soft, slight tenderness, left and right lower abdomen, no guarding  No organomegaly appriciated, No rebound - guarding or rigidity. No Cyanosis, Clubbing or edema, negative cullens sign, left avf.     Data Review:    CBC  Recent Labs Lab 11/04/15 0837 11/05/15 0457  WBC 19.4* 20.1*  HGB 9.5* 9.6*  HCT 29.5* 29.0*  PLT 405* 468*  MCV 79.7 79.9  MCH 25.7* 26.4  MCHC 32.2 33.1  RDW 17.5* 17.6*  LYMPHSABS 0.6*  --   MONOABS 0.7  --   EOSABS 0.0  --   BASOSABS 0.0  --     Chemistries   Recent Labs Lab 11/04/15 0837 11/05/15 0457  NA 135 134*  K 3.9 4.2  CL 94* 90*  CO2 21* 21*  GLUCOSE 127* 113*  BUN 66* 86*  CREATININE 9.01* 10.95*  CALCIUM 7.7* 7.4*  MG 2.2  --   AST 36  --   ALT 24  --   ALKPHOS 158*  --  BILITOT 1.2  --    ------------------------------------------------------------------------------------------------------------------ No results for input(s): CHOL, HDL, LDLCALC, TRIG, CHOLHDL, LDLDIRECT in the last 72 hours.  Lab Results  Component Value Date   HGBA1C 5.4 06/20/2015   ------------------------------------------------------------------------------------------------------------------  Recent Labs  11/04/15 0837  TSH 1.304  1.162   ------------------------------------------------------------------------------------------------------------------ No results for input(s): VITAMINB12, FOLATE, FERRITIN, TIBC, IRON, RETICCTPCT in the last 72 hours.  Coagulation profile  Recent Labs Lab 11/04/15 0837  INR 1.36    No results for input(s): DDIMER in the last 72 hours.  Cardiac Enzymes No results for input(s): CKMB, TROPONINI, MYOGLOBIN in the last 168 hours.  Invalid input(s):  CK ------------------------------------------------------------------------------------------------------------------    Component Value Date/Time   BNP 192.0* 11/04/2015 0837    Inpatient Medications  Scheduled Meds: . amLODipine  10 mg Oral Daily  . aspirin EC  81 mg Oral Daily  . buPROPion  150 mg Oral BID  . calcitRIOL  0.5 mcg Oral Daily  . cinacalcet  60 mg Oral Q breakfast  . escitalopram  20 mg Oral Daily  . feeding supplement (NEPRO CARB STEADY)  237 mL Oral BID BM  . heparin  5,000 Units Subcutaneous BID  . hydrALAZINE  100 mg Oral BID  . labetalol  150 mg Oral BID  . pantoprazole (PROTONIX) IV  40 mg Intravenous Q24H  . piperacillin-tazobactam (ZOSYN)  IV  2.25 g Intravenous Q8H  . pravastatin  20 mg Oral q1800  . sodium chloride flush  3 mL Intravenous Q12H  . sodium chloride flush  3 mL Intravenous Q12H  . sucralfate  1 g Oral TID WC & HS   Continuous Infusions:  PRN Meds:.sodium chloride, ALPRAZolam, gi cocktail, HYDROcodone-acetaminophen, meperidine (DEMEROL) injection, sodium chloride flush, traMADol  Micro Results No results found for this or any previous visit (from the past 240 hour(s)).  Radiology Reports Dg Chest 2 View  11/04/2015  CLINICAL DATA:  Pneumonia. EXAM: CHEST  2 VIEW COMPARISON:  06/20/2015 FINDINGS: Focal airspace disease in the right lower lobe which is new from prior. No cavitation or effusion. Generous heart size without definitive cardiomegaly. Stable aortic and hilar contours. IMPRESSION: Right lower lobe pneumonia. Followup PA and lateral chest X-ray is recommended in 3-4 weeks following trial of antibiotic therapy to ensure resolution. Electronically Signed   By: Monte Fantasia M.D.   On: 11/04/2015 10:14   Dg Abd 1 View  11/04/2015  CLINICAL DATA:  Two day history of abdominal pain EXAM: ABDOMEN - 1 VIEW COMPARISON:  CT abdomen and pelvis May 16, 2014 FINDINGS: There is no appreciable bowel dilatation or air-fluid level suggesting  obstruction. No free air is seen on this supine examination. There are vascular calcifications in the pelvis. IMPRESSION: No demonstrable bowel obstruction or free air. Electronically Signed   By: Lowella Grip III M.D.   On: 11/04/2015 10:13    Time Spent in minutes  30   Jani Gravel M.D on 11/05/2015 at 11:29 AM  Between 7am to 7pm - Pager - [phone] After 7pm go to [email or web address] - password [facility]  [name] -  Office  [phone]

## 2015-11-06 ENCOUNTER — Inpatient Hospital Stay (HOSPITAL_COMMUNITY): Payer: 59

## 2015-11-06 ENCOUNTER — Inpatient Hospital Stay (HOSPITAL_COMMUNITY): Payer: 59 | Admitting: Anesthesiology

## 2015-11-06 ENCOUNTER — Encounter (HOSPITAL_COMMUNITY)
Admission: AD | Disposition: A | Discharge status: Home / Self Care | Payer: Self-pay | Source: Other Acute Inpatient Hospital | Attending: Internal Medicine

## 2015-11-06 ENCOUNTER — Encounter (HOSPITAL_COMMUNITY): Payer: Self-pay | Admitting: General Surgery

## 2015-11-06 DIAGNOSIS — E1100 Type 2 diabetes mellitus with hyperosmolarity without nonketotic hyperglycemic-hyperosmolar coma (NKHHC): Secondary | ICD-10-CM

## 2015-11-06 DIAGNOSIS — Z905 Acquired absence of kidney: Secondary | ICD-10-CM

## 2015-11-06 DIAGNOSIS — N179 Acute kidney failure, unspecified: Secondary | ICD-10-CM

## 2015-11-06 HISTORY — PX: CHOLECYSTECTOMY: SHX55

## 2015-11-06 LAB — GLUCOSE, CAPILLARY
GLUCOSE-CAPILLARY: 116 mg/dL — AB (ref 65–99)
Glucose-Capillary: 91 mg/dL (ref 65–99)

## 2015-11-06 SURGERY — LAPAROSCOPIC CHOLECYSTECTOMY
Anesthesia: General | Site: Abdomen

## 2015-11-06 MED ORDER — LIDOCAINE HCL (CARDIAC) 20 MG/ML IV SOLN
INTRAVENOUS | Status: DC | PRN
  Administered 2015-11-06: 60 mg via INTRAVENOUS

## 2015-11-06 MED ORDER — MIDAZOLAM HCL 5 MG/5ML IJ SOLN
INTRAMUSCULAR | Status: DC | PRN
  Administered 2015-11-06: 1 mg via INTRAVENOUS

## 2015-11-06 MED ORDER — SUGAMMADEX SODIUM 500 MG/5ML IV SOLN
INTRAVENOUS | Status: DC | PRN
  Administered 2015-11-06: 400 mg via INTRAVENOUS

## 2015-11-06 MED ORDER — ONDANSETRON HCL 4 MG/2ML IJ SOLN
INTRAMUSCULAR | Status: AC
  Filled 2015-11-06: qty 2

## 2015-11-06 MED ORDER — PROPOFOL 10 MG/ML IV BOLUS
INTRAVENOUS | Status: AC
Start: 2015-11-06 — End: 2015-11-06
  Filled 2015-11-06: qty 20

## 2015-11-06 MED ORDER — FENTANYL CITRATE (PF) 100 MCG/2ML IJ SOLN
25.0000 ug | INTRAMUSCULAR | Status: DC | PRN
  Administered 2015-11-06: 50 ug via INTRAVENOUS

## 2015-11-06 MED ORDER — MIDAZOLAM HCL 2 MG/2ML IJ SOLN
INTRAMUSCULAR | Status: AC
Start: 2015-11-06 — End: 2015-11-06
  Filled 2015-11-06: qty 2

## 2015-11-06 MED ORDER — ONDANSETRON HCL 4 MG/2ML IJ SOLN
INTRAMUSCULAR | Status: DC | PRN
  Administered 2015-11-06: 4 mg via INTRAVENOUS

## 2015-11-06 MED ORDER — ALBUMIN HUMAN 5 % IV SOLN
INTRAVENOUS | Status: DC | PRN
  Administered 2015-11-06: 16:00:00 via INTRAVENOUS

## 2015-11-06 MED ORDER — BUPIVACAINE HCL (PF) 0.25 % IJ SOLN
INTRAMUSCULAR | Status: AC
  Filled 2015-11-06: qty 30

## 2015-11-06 MED ORDER — FENTANYL CITRATE (PF) 100 MCG/2ML IJ SOLN
INTRAMUSCULAR | Status: AC
  Filled 2015-11-06: qty 2

## 2015-11-06 MED ORDER — DARBEPOETIN ALFA 200 MCG/0.4ML IJ SOSY
200.0000 ug | PREFILLED_SYRINGE | INTRAMUSCULAR | Status: DC
  Filled 2015-11-06: qty 0.4

## 2015-11-06 MED ORDER — LIDOCAINE 2% (20 MG/ML) 5 ML SYRINGE
INTRAMUSCULAR | Status: AC
  Filled 2015-11-06: qty 10

## 2015-11-06 MED ORDER — FENTANYL CITRATE (PF) 100 MCG/2ML IJ SOLN
INTRAMUSCULAR | Status: DC | PRN
  Administered 2015-11-06: 50 ug via INTRAVENOUS
  Administered 2015-11-06 (×2): 100 ug via INTRAVENOUS

## 2015-11-06 MED ORDER — FENTANYL CITRATE (PF) 250 MCG/5ML IJ SOLN
INTRAMUSCULAR | Status: AC
  Filled 2015-11-06: qty 5

## 2015-11-06 MED ORDER — HEMOSTATIC AGENTS (NO CHARGE) OPTIME
TOPICAL | Status: DC | PRN
  Administered 2015-11-06: 1 via TOPICAL

## 2015-11-06 MED ORDER — ROCURONIUM BROMIDE 100 MG/10ML IV SOLN
INTRAVENOUS | Status: DC | PRN
  Administered 2015-11-06: 30 mg via INTRAVENOUS

## 2015-11-06 MED ORDER — GLYCOPYRROLATE 0.2 MG/ML IV SOSY
PREFILLED_SYRINGE | INTRAVENOUS | Status: AC
  Filled 2015-11-06: qty 3

## 2015-11-06 MED ORDER — VANCOMYCIN HCL IN DEXTROSE 1-5 GM/200ML-% IV SOLN
1000.0000 mg | INTRAVENOUS | Status: DC
  Filled 2015-11-06: qty 200

## 2015-11-06 MED ORDER — HYDRALAZINE HCL 20 MG/ML IJ SOLN: 10.0000 mg | Freq: Four times a day (QID) | INTRAMUSCULAR | Status: DC | PRN

## 2015-11-06 MED ORDER — SUCCINYLCHOLINE CHLORIDE 20 MG/ML IJ SOLN
INTRAMUSCULAR | Status: DC | PRN
  Administered 2015-11-06: 100 mg via INTRAVENOUS

## 2015-11-06 MED ORDER — NEOSTIGMINE METHYLSULFATE 5 MG/5ML IV SOSY
PREFILLED_SYRINGE | INTRAVENOUS | Status: AC
  Filled 2015-11-06: qty 5

## 2015-11-06 MED ORDER — METOPROLOL TARTRATE 5 MG/5ML IV SOLN: 5.0000 mg | INTRAVENOUS | Status: DC | PRN

## 2015-11-06 MED ORDER — 0.9 % SODIUM CHLORIDE (POUR BTL) OPTIME
TOPICAL | Status: DC | PRN
  Administered 2015-11-06: 1000 mL

## 2015-11-06 MED ORDER — FENTANYL CITRATE (PF) 100 MCG/2ML IJ SOLN
25.0000 ug | INTRAMUSCULAR | Status: DC | PRN
  Administered 2015-11-06 – 2015-11-08 (×6): 25 ug via INTRAVENOUS
  Filled 2015-11-06 (×6): qty 2

## 2015-11-06 MED ORDER — OXYCODONE HCL 5 MG PO TABS
5.0000 mg | ORAL_TABLET | ORAL | Status: DC | PRN
  Administered 2015-11-08: 10 mg via ORAL
  Filled 2015-11-06: qty 2

## 2015-11-06 MED ORDER — ROCURONIUM BROMIDE 50 MG/5ML IV SOLN
INTRAVENOUS | Status: AC
  Filled 2015-11-06: qty 1

## 2015-11-06 MED ORDER — PROPOFOL 10 MG/ML IV BOLUS
INTRAVENOUS | Status: DC | PRN
  Administered 2015-11-06: 150 mg via INTRAVENOUS

## 2015-11-06 MED ORDER — FENTANYL CITRATE (PF) 100 MCG/2ML IJ SOLN
25.0000 ug | INTRAMUSCULAR | Status: DC | PRN
  Administered 2015-11-06: 50 ug via INTRAVENOUS
  Filled 2015-11-06: qty 2

## 2015-11-06 MED ORDER — SUGAMMADEX SODIUM 500 MG/5ML IV SOLN
INTRAVENOUS | Status: AC
  Filled 2015-11-06: qty 5

## 2015-11-06 MED ORDER — ONDANSETRON HCL 4 MG/2ML IJ SOLN: 4.0000 mg | Freq: Four times a day (QID) | INTRAMUSCULAR | Status: DC | PRN

## 2015-11-06 MED ORDER — SODIUM CHLORIDE 0.9 % IV SOLN
125.0000 mg | INTRAVENOUS | Status: DC
  Administered 2015-11-07: 125 mg via INTRAVENOUS
  Filled 2015-11-06 (×3): qty 10

## 2015-11-06 MED ORDER — ONDANSETRON 4 MG PO TBDP: 4.0000 mg | ORAL_TABLET | Freq: Four times a day (QID) | ORAL | Status: DC | PRN

## 2015-11-06 MED ORDER — BUPIVACAINE HCL 0.25 % IJ SOLN
INTRAMUSCULAR | Status: DC | PRN
  Administered 2015-11-06: 8 mL

## 2015-11-06 MED ORDER — SODIUM CHLORIDE 0.9 % IV SOLN
INTRAVENOUS | Status: DC
  Administered 2015-11-06 (×2): via INTRAVENOUS

## 2015-11-06 MED ORDER — PHENYLEPHRINE HCL 10 MG/ML IJ SOLN
10.0000 mg | INTRAVENOUS | Status: DC | PRN
  Administered 2015-11-06: 25 ug/min via INTRAVENOUS

## 2015-11-06 SURGICAL SUPPLY — 56 items
APL SKNCLS STERI-STRIP NONHPOA (GAUZE/BANDAGES/DRESSINGS) ×2
APPLIER CLIP 5 13 M/L LIGAMAX5 (MISCELLANEOUS)
APR CLP MED LRG 5 ANG JAW (MISCELLANEOUS)
BAG SPEC RTRVL LRG 6X4 10 (ENDOMECHANICALS)
BENZOIN TINCTURE PRP APPL 2/3 (GAUZE/BANDAGES/DRESSINGS) ×4 IMPLANT
CANISTER SUCTION 2500CC (MISCELLANEOUS) ×4 IMPLANT
CHLORAPREP W/TINT 26ML (MISCELLANEOUS) ×4 IMPLANT
CLIP APPLIE 5 13 M/L LIGAMAX5 (MISCELLANEOUS) IMPLANT
CLIP LIGATING HEMO O LOK GREEN (MISCELLANEOUS) ×4 IMPLANT
CLOSURE STERI-STRIP 1/2X4 (GAUZE/BANDAGES/DRESSINGS) ×1
CLOSURE WOUND 1/2 X4 (GAUZE/BANDAGES/DRESSINGS) ×1
CLSR STERI-STRIP ANTIMIC 1/2X4 (GAUZE/BANDAGES/DRESSINGS) ×2 IMPLANT
COVER MAYO STAND STRL (DRAPES) ×4 IMPLANT
COVER SURGICAL LIGHT HANDLE (MISCELLANEOUS) ×4 IMPLANT
COVER TRANSDUCER ULTRASND (DRAPES) ×3 IMPLANT
DEVICE TROCAR PUNCTURE CLOSURE (ENDOMECHANICALS) ×4 IMPLANT
DRAIN CHANNEL 19F RND (DRAIN) ×3 IMPLANT
DRAPE C-ARM 42X72 X-RAY (DRAPES) ×4 IMPLANT
ELECT REM PT RETURN 9FT ADLT (ELECTROSURGICAL) ×4
ELECTRODE REM PT RTRN 9FT ADLT (ELECTROSURGICAL) ×2 IMPLANT
EVACUATOR SILICONE 100CC (DRAIN) ×3 IMPLANT
GAUZE SPONGE 2X2 8PLY STRL LF (GAUZE/BANDAGES/DRESSINGS) ×2 IMPLANT
GLOVE BIO SURGEON STRL SZ 6.5 (GLOVE) ×2 IMPLANT
GLOVE BIO SURGEON STRL SZ7.5 (GLOVE) ×7 IMPLANT
GLOVE BIO SURGEONS STRL SZ 6.5 (GLOVE) ×1
GLOVE BIOGEL PI IND STRL 7.0 (GLOVE) ×1 IMPLANT
GLOVE BIOGEL PI IND STRL 7.5 (GLOVE) ×1 IMPLANT
GLOVE BIOGEL PI INDICATOR 7.0 (GLOVE) ×2
GLOVE BIOGEL PI INDICATOR 7.5 (GLOVE) ×2
GOWN STRL REUS W/ TWL LRG LVL3 (GOWN DISPOSABLE) ×4 IMPLANT
GOWN STRL REUS W/ TWL XL LVL3 (GOWN DISPOSABLE) ×2 IMPLANT
GOWN STRL REUS W/TWL LRG LVL3 (GOWN DISPOSABLE) ×8
GOWN STRL REUS W/TWL XL LVL3 (GOWN DISPOSABLE) ×4
IV CATH 14GX2 1/4 (CATHETERS) ×4 IMPLANT
KIT BASIN OR (CUSTOM PROCEDURE TRAY) ×4 IMPLANT
KIT ROOM TURNOVER OR (KITS) ×4 IMPLANT
NDL INSUFFLATION 14GA 120MM (NEEDLE) ×1 IMPLANT
NEEDLE INSUFFLATION 14GA 120MM (NEEDLE) ×4 IMPLANT
NS IRRIG 1000ML POUR BTL (IV SOLUTION) ×4 IMPLANT
PAD ARMBOARD 7.5X6 YLW CONV (MISCELLANEOUS) ×8 IMPLANT
POUCH SPECIMEN RETRIEVAL 10MM (ENDOMECHANICALS) IMPLANT
SCISSORS LAP 5X35 DISP (ENDOMECHANICALS) ×4 IMPLANT
SET CHOLANGIOGRAPHY FRANKLIN (SET/KITS/TRAYS/PACK) ×4 IMPLANT
SET IRRIG TUBING LAPAROSCOPIC (IRRIGATION / IRRIGATOR) ×7 IMPLANT
SLEEVE ENDOPATH XCEL 5M (ENDOMECHANICALS) ×4 IMPLANT
SPECIMEN JAR SMALL (MISCELLANEOUS) ×4 IMPLANT
SPONGE GAUZE 2X2 STER 10/PKG (GAUZE/BANDAGES/DRESSINGS) ×2
STRIP CLOSURE SKIN 1/2X4 (GAUZE/BANDAGES/DRESSINGS) ×3 IMPLANT
SUT ETHILON 3 0 FSL (SUTURE) ×3 IMPLANT
SUT MNCRL AB 3-0 PS2 18 (SUTURE) ×7 IMPLANT
TOWEL OR 17X24 6PK STRL BLUE (TOWEL DISPOSABLE) ×4 IMPLANT
TOWEL OR 17X26 10 PK STRL BLUE (TOWEL DISPOSABLE) ×4 IMPLANT
TRAY LAPAROSCOPIC MC (CUSTOM PROCEDURE TRAY) ×4 IMPLANT
TROCAR XCEL NON-BLD 11X100MML (ENDOMECHANICALS) ×4 IMPLANT
TROCAR XCEL NON-BLD 5MMX100MML (ENDOMECHANICALS) ×4 IMPLANT
TUBING INSUFFLATION (TUBING) ×4 IMPLANT

## 2015-11-06 NOTE — Op Note (Signed)
11/06/2015  5:02 PM  PATIENT:  Hayden [name]  45 y.o. male  PRE-OPERATIVE DIAGNOSIS:  Acute cholecystitis  POST-OPERATIVE DIAGNOSIS:  Acute gangrenous cholecystitis  PROCEDURE:  Procedure(s): LAPAROSCOPIC CHOLECYSTECTOMY  SURGEON:  Surgeon(s) and Role:    * Ralene Ok, MD - Primary  ASSISTANTS: Dyann Kief, MD   ANESTHESIA:   local and general  EBL:   5cc  BLOOD ADMINISTERED:none  DRAINS: (19Fr) Jackson-Pratt drain(s) with closed bulb suction in the gallbladder fossa   LOCAL MEDICATIONS USED:  BUPIVICAINE   SPECIMEN:  Source of Specimen:  gallbladder  DISPOSITION OF SPECIMEN:  PATHOLOGY  COUNTS:  YES  TOURNIQUET:  * No tourniquets in log *  DICTATION: .Dragon Dictation The patient was taken to the operating and placed in the supine position with bilateral SCDs in place. The patient was prepped and draped in the usual sterile fashion. A time out was called and all facts were verified. A pneumoperitoneum was obtained via A Veress needle technique to a pressure of 13mm of mercury.  A 67mm trochar was then placed in the right upper quadrant under visualization, and there were no injuries to any abdominal organs. There was a large amount of bile within the abdomen.  A 11 mm port was then placed in the umbilical region after infiltrating with local anesthesia under direct visualization. A second and third epigastric port and right lower quadrant port placement under direct visualization, respectively. The gallbladder appeared necrotic and perforation of the gallbladder could be seen.   The gallbladder was identified and retracted, the peritoneum was then bluntly dissected from the gallbladder and this dissection was carried down to Calot's triangle. The gallbladder was identified and stripped away circumferentially and seen going into the gallbladder 360, the critical angle was obtained.  2 clips were placed proximally one distally and the cystic duct transected. The cystic  artery was identified and 2 clips placed proximally and one distally and transected. We then proceeded to remove the gallbladder off the hepatic fossa with Bovie cautery. A retrieval bag was then placed in the abdomen and gallbladder placed in the bag. The hepatic fossa was then reexamined and hemostasis was achieved with Bovie cautery.  Surgicel snow was placed in the gallbladder fossa.  A 57fr drain was placed in the gallbladder fossa and brought out via the right subcostal port site. The subhepatic fossa and perihepatic fossa was then irrigated until the effluent was clear.  The gallbladder and bag were removed from the abdominal cavity. The 11 mm trocar fascia was reapproximated with the Endo Close #1 Vicryl x1. The pneumoperitoneum was evacuated and all trochars removed under direct visulalization. The skin was then closed with 4-0 Monocryl and the skin dressed with Steri-Strips, gauze, and tape. The patient was awaken from general anesthesia and taken to the recovery room in stable condition.   PLAN OF CARE: Admit to inpatient   PATIENT DISPOSITION:  PACU - hemodynamically stable.   Delay start of Pharmacological VTE agent (>24hrs) due to surgical blood loss or risk of bleeding: yes

## 2015-11-06 NOTE — Consult Note (Signed)
[facility] Renal Consultation Note  Indication for Consultation:  Management of ESRD/hemodialysis; anemia, hypertension/volume and secondary hyperparathyroidism  HPI: Hayden [name] is a 45 y.o. male with ESRD on Home hd ( mon>fri, Dr. Lupita Dawn) admitted with Pna Post RLL and Abdominal discomfort/ now Abdominal pain progressing ,reports pain after eating , fevers to 102 and CT Abdomen /pelvis showing distended gallbaldder,thickening of duodenum, RLL infiltrate. Abdominal US shows gallbladder thickening, distention and edema. Surgery  has evaluated and plans to take to OR for lap chole today for Acute calculous cholecystitis. No acute HD needs now , k and volume ok.      Past Medical History  Diagnosis Date  . History of kidney cancer   . Hypertension   . CHF (congestive heart failure) ([place]) 1610    Acute systolic and diastolic CHF  . Renal disorder   . Obesity   . Hyperlipidemia   . Anemia March 2014  . Cancer ([place])     Kidney  . PONV (postoperative nausea and vomiting)   . Depression     & rage --  was in counseling....great now  . Diabetes mellitus     NO DM SINCE LOST 130LBS    Past Surgical History  Procedure Laterality Date  . Nephrectomy Right 2008    partial  . Testicle torsion reduction    . Hernia repair    . Tonsillectomy and adenoidectomy    . Av fistula placement Left 01/26/2013    Procedure: ARTERIOVENOUS (AV) FISTULA CREATION - LEFT RADIAL CEPHALIC AVF;  Surgeon: Angelia Mould, MD;  Location: [facility] OR;  Service: Vascular;  Laterality: Left;      Family History  Problem Relation Age of Onset  . Heart disease Mother     Heart Disease before age 1  . Deep vein thrombosis Father   . Heart attack Father   . Other Other       reports that he has been smoking Cigarettes.  He has a 2.5 pack-year smoking history. He has never used smokeless tobacco. He reports that he uses illicit drugs (Marijuana). He reports that he does not  drink alcohol.   Allergies  Allergen Reactions  . Dilaudid [Hydromorphone Hcl] Other (See Comments)    Abnormal behavior  . Morphine And Related Itching    Prior to Admission medications   Medication Sig Start Date End Date Taking? Authorizing Provider  amLODipine (NORVASC) 10 MG tablet Take 10 mg by mouth daily.   Yes Historical Provider, MD  aspirin EC 81 MG tablet Take 81 mg by mouth daily.   Yes Historical Provider, MD  buPROPion (WELLBUTRIN SR) 150 MG 12 hr tablet Take one tab by mouth every morning & two tabs every evening   Yes Historical Provider, MD  calcitRIOL (ROCALTROL) 0.5 MCG capsule Take 0.5 mcg by mouth daily.   Yes Historical Provider, MD  cinacalcet (SENSIPAR) 30 MG tablet Take 60 mg by mouth daily.    Yes Historical Provider, MD  cloNIDine (CATAPRES - DOSED IN MG/24 HR) 0.2 mg/24hr patch Place 1 patch onto the skin once a week.   Yes Historical Provider, MD  escitalopram (LEXAPRO) 20 MG tablet Take 20 mg by mouth daily.   Yes Historical Provider, MD  furosemide (LASIX) 80 MG tablet Take 80 mg by mouth 2 (two) times daily.   Yes Historical Provider, MD  hydrALAZINE (APRESOLINE) 100 MG tablet Take 100 mg by mouth 2 (two) times daily.   Yes Historical Provider, MD  labetalol (NORMODYNE) 100 MG tablet Take 150 mg by mouth 2 (two) times daily.    Yes Historical Provider, MD  omeprazole (PRILOSEC) 40 MG capsule Take 40 mg by mouth daily.   Yes Historical Provider, MD  pravastatin (PRAVACHOL) 20 MG tablet Take 20 mg by mouth daily.   Yes Historical Provider, MD  VANCOMYCIN HCL IV Inject 1,000 mg into the vein. 1gm started  at 0320 and stopped at [place] per Deb (RN) at [facility]   Yes Historical Provider, MD  ALPRAZolam Duanne Moron) 1 MG tablet Take 1 mg by mouth at bedtime as needed for anxiety.    Historical Provider, MD  Cholecalciferol (VITAMIN D PO) Take 1 capsule by mouth daily.    Historical Provider, MD     Anti-infectives    Start     Dose/Rate Route Frequency Ordered  Stop   11/06/15 1800  vancomycin (VANCOCIN) IVPB 1000 mg/200 mL premix     1,000 mg 200 mL/hr over 60 Minutes Intravenous Every [name]-F (Hemodialysis) 11/06/15 0825     11/05/15 1400  azithromycin (ZITHROMAX) 500 mg in dextrose 5 % 250 mL IVPB     500 mg 250 mL/hr over 60 Minutes Intravenous Every 24 hours 11/05/15 1153     11/04/15 1000  piperacillin-tazobactam (ZOSYN) IVPB 2.25 g     2.25 g 100 mL/hr over 30 Minutes Intravenous Every 8 hours 11/04/15 0902     11/04/15 0900  cefTRIAXone (ROCEPHIN) 1 g in dextrose 5 % 50 mL IVPB  Status:  Discontinued    Comments:  From time of last dose outside hospital   1 g 100 mL/hr over 30 Minutes Intravenous Every 24 hours 11/04/15 0811 11/04/15 0844   11/04/15 0900  azithromycin (ZITHROMAX) 500 mg in dextrose 5 % 250 mL IVPB  Status:  Discontinued    Comments:  From time of last dose outside hospital   500 mg 250 mL/hr over 60 Minutes Intravenous Every 24 hours 11/04/15 0811 11/04/15 0844   11/04/15 0845  piperacillin-tazobactam (ZOSYN) IVPB 3.375 g  Status:  Discontinued    Comments:  From time received ouside er/ 11/04/15  At 315am   3.375 g 100 mL/hr over 30 Minutes Intravenous Every 8 hours 11/04/15 0844 11/04/15 0902      Results for orders placed or performed during the hospital encounter of 11/04/15 (from the past 48 hour(s))  Vancomycin, random     Status: None   Collection Time: 11/04/15  8:56 AM  Result Value Ref Range   Vancomycin Rm 23 ug/mL    Comment:        Random Vancomycin therapeutic range is dependent on dosage and time of specimen collection. A peak range is 20.0-40.0 ug/mL A trough range is 5.0-15.0 ug/mL          Culture, blood (routine x 2) Call MD if unable to obtain prior to antibiotics being given     Status: None (Preliminary result)   Collection Time: 11/04/15  9:00 AM  Result Value Ref Range   Specimen Description BLOOD RIGHT HAND    Special Requests IN PEDIATRIC BOTTLE 1CC    Culture NO GROWTH 1 DAY     Report Status PENDING   Urinalysis, Routine w reflex microscopic (not at [facility])     Status: Abnormal   Collection Time: 11/04/15  7:30 PM  Result Value Ref Range   Color, Urine AMBER (A) YELLOW    Comment: BIOCHEMICALS MAY BE AFFECTED BY COLOR   APPearance TURBID (A) CLEAR  Specific Gravity, Urine 1.019 1.005 - 1.030   pH 5.5 5.0 - 8.0   Glucose, UA 100 (A) NEGATIVE mg/dL   Hgb urine dipstick NEGATIVE NEGATIVE   Bilirubin Urine NEGATIVE NEGATIVE   Ketones, ur NEGATIVE NEGATIVE mg/dL   Protein, ur >300 (A) NEGATIVE mg/dL   Nitrite NEGATIVE NEGATIVE   Leukocytes, UA NEGATIVE NEGATIVE  Urine microscopic-add on     Status: Abnormal   Collection Time: 11/04/15  7:30 PM  Result Value Ref Range   Squamous Epithelial / LPF 6-30 (A) NONE SEEN   WBC, UA 0-5 0 - 5 WBC/hpf   RBC / HPF NONE SEEN 0 - 5 RBC/hpf   Bacteria, UA MANY (A) NONE SEEN   Casts GRANULAR CAST (A) NEGATIVE  Basic metabolic panel     Status: Abnormal   Collection Time: 11/05/15  4:57 AM  Result Value Ref Range   Sodium 134 (L) 135 - 145 mmol/L   Potassium 4.2 3.5 - 5.1 mmol/L   Chloride 90 (L) 101 - 111 mmol/L   CO2 21 (L) 22 - 32 mmol/L   Glucose, Bld 113 (H) 65 - 99 mg/dL   BUN 86 (H) 6 - 20 mg/dL   Creatinine, Ser 10.95 (H) 0.61 - 1.24 mg/dL   Calcium 7.4 (L) 8.9 - 10.3 mg/dL   GFR calc non Af Amer 5 (L) >60 mL/min   GFR calc Af Amer 6 (L) >60 mL/min    Comment: (NOTE) The eGFR has been calculated using the CKD EPI equation. This calculation has not been validated in all clinical situations. eGFR's persistently <60 mL/min signify possible Chronic Kidney Disease.    Anion gap 23 (H) 5 - 15    Comment: RESULT CHECKED  CBC     Status: Abnormal   Collection Time: 11/05/15  4:57 AM  Result Value Ref Range   WBC 20.1 (H) 4.0 - 10.5 K/uL   RBC 3.63 (L) 4.22 - 5.81 MIL/uL   Hemoglobin 9.6 (L) 13.0 - 17.0 g/dL   HCT 29.0 (L) 39.0 - 52.0 %   MCV 79.9 78.0 - 100.0 fL   MCH 26.4 26.0 - 34.0 pg   MCHC 33.1 30.0 -  36.0 g/dL   RDW 17.6 (H) 11.5 - 15.5 %   Platelets 468 (H) 150 - 400 K/uL    ROS:  See hpi and denies shortness of breath / chest pains/ Dizzines or problems using his AVF . Physical Exam: Filed Vitals:   11/05/15 2256 11/06/15 0442  BP: 132/64 131/61  Pulse: 81 84  Temp: 97.6 F (36.4 C) 97.5 F (36.4 C)  Resp: 18 16     General: alert Adult WM NAD  But Uncomfortable co abdominal pain  HEENT: Halfway MMM, nopnicteric Neck: supple , no jvd Heart: RRR no mur, rub or gal. Lungs: CTA  Bilat/ nonlabored breathing Abdomen: obese, BS =nl , tender all quads but More Right quad.  Extremities: no pedal edema Skin: warm dry /no pedal ulcers Neuro: alert Ox3, moves all extrem.  Dialysis Access: pos bruit L FA AVF  Dialysis Orders: Center: HOME HD Mon>Fris  Followed by Dr. Moshe Cipro 3 hrs  1.0 k  edw 108 KG    .     Heparin 1000. Access LFA AVF       meds= Mircera 200 next  Due 11/15/15    Venofer  336m on 11/15/15  Other op labs= TFS 9% =501/17,  Ferritin 712/ phos =6.9  Ca 6.7  pth 117  Assessment/Plan  1. ESRD -   Normal Home HD  Is Mon>fri 3hrs  Will plan for Mon, wed , fri Hd  4 hrs while in [place] , K 4.2 and vol ok now , for OR today , hold HD until am  Use no heparin . Fu labs 2. Acute calculous cholecystitis- per [facility] =to OR for lap chole today. 3. PNA  Post. RLL= on Iv antib = zosyn and Vanco per admit 4. Hypertension/volume  - bp stable on BP meds / no excess volume ,below edw   5. Anemia  -  Of ESRD  - ESA in hosp / Iron load with Low Transfer sat below 10% 6. Metabolic bone disease -    Uses po vit d / sensipar/ no binder listed / fu phos / ca  7. 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

## 2015-11-06 NOTE — Transfer of Care (Signed)
Immediate Anesthesia Transfer of Care Note  Patient: Hayden [name]  Procedure(s) Performed: Procedure(s): LAPAROSCOPIC CHOLECYSTECTOMY  Patient Location: PACU  Anesthesia Type:General  Level of Consciousness: awake and alert   Airway & Oxygen Therapy: Patient Spontanous Breathing and Patient connected to face mask oxygen  Post-op Assessment: Report given to RN, Post -op Vital signs reviewed and stable and Patient moving all extremities X 4  Post vital signs: Reviewed and stable  Last Vitals:  Filed Vitals:   11/06/15 0855 11/06/15 1400  BP: 146/61 161/73  Pulse: 84 88  Temp: 36.4 C 36.7 C  Resp: 18 16    Last Pain:  Filed Vitals:   11/06/15 1728  PainSc: Asleep      Patients Stated Pain Goal: 2 ([id number])  Complications: No apparent anesthesia complications

## 2015-11-06 NOTE — Anesthesia Preprocedure Evaluation (Addendum)
Anesthesia Evaluation  Patient identified by MRN, date of birth, ID band  Reviewed: Allergy & Precautions, H&P , NPO status , Patient's Chart, lab work & pertinent test results  History of Anesthesia Complications (+) PONV  Airway Mallampati: II  TM Distance: >3 FB Neck ROM: Full    Dental no notable dental hx. (+) Teeth Intact, Dental Advisory Given   Pulmonary Current Smoker,    Pulmonary exam normal breath sounds clear to auscultation       Cardiovascular hypertension, Pt. on medications +CHF   Rhythm:Regular Rate:Normal     Neuro/Psych Depression CVA    GI/Hepatic Neg liver ROS, GERD  Medicated and Controlled,  Endo/Other  diabetes  Renal/GU ESRF and DialysisRenal disease  negative genitourinary   Musculoskeletal   Abdominal   Peds  Hematology negative hematology ROS (+) anemia ,   Anesthesia Other Findings   Reproductive/Obstetrics negative OB ROS                            Anesthesia Physical Anesthesia Plan  ASA: III  Anesthesia Plan: General   Post-op Pain Management:    Induction: Intravenous  Airway Management Planned: Oral ETT  Additional Equipment:   Intra-op Plan:   Post-operative Plan: Extubation in OR  Informed Consent: I have reviewed the patients History and Physical, chart, labs and discussed the procedure including the risks, benefits and alternatives for the proposed anesthesia with the patient or authorized representative who has indicated his/her understanding and acceptance.   Dental advisory given  Plan Discussed with: CRNA  Anesthesia Plan Comments:         Anesthesia Quick Evaluation

## 2015-11-06 NOTE — Progress Notes (Signed)
PROGRESS NOTE                                                                                                                                                                                                             Patient Demographics:    Hayden [name], is a 45 y.o. male, DOB - July 25, 1970, TV:[id number]  Admit date - 11/04/2015   Admitting Physician Maren Reamer, MD  Outpatient Primary MD for the patient is Louis Meckel, MD  LOS - 2  Outpatient Specialists:  [facility] [facility]  No chief complaint on file.      Brief Narrative   Hayden [name] is a 45 y.o. male with end-stage renal disease on home hemodialysis, Hypertension, lacunar infarcts in 06/2015, answered spell outside hospital ER [place], [place] [facility] ER) with community-acquired pneumonia. Of note, he stated he had some viral flulike illness, Monday through Wednesday with nonproductive cough, low-grade temperatures 101-102. Started feeling better until Wednesday afternoon started having severe abdominal pain with mild nausea, described as a burning sensation.   He was admitted with running diagnosis of pneumonia further workup showed acute cholecystitis, Gen. surgery was consulted along with renal for dialysis.   Subjective:    Hayden [name] today has, No headache, No chest pain, RLQ abdominal pain - No Nausea, No new weakness tingling or numbness, No Cough - SOB.     Assessment  & Plan :      1.Right upper quadrant abdominal Pain due to acute cholecystitis. Nothing by mouth, IV antibiotics, general surgery consulted due for surgery later today.  2. ESRD. On Monday, Wednesday and Friday schedule. Renal informed.  3. Possibility of pneumonia. He really does not have any cough or shortness of breath. Continue on empiric so sent. Stop other antibiotics and monitor.  4. Essential hypertension. Home medications continued would as needed IV  Lopressor and hydralazine.  5. HX of stroke in the past. No acute issue. Supportive care.    Code Status : Full  Family Communication  : None  Disposition Plan  : Home 2-3 days  Barriers For Discharge : Surgery for cholecystitis  Consults  :  Surgery  Procedures  :   CT Scan abdomen and pelvis and right upper quadrant ultrasound suggestive of acute cholecystitis.  Laparoscopic cholecystectomy was only on 11/06/2015  DVT Prophylaxis  :  Heparin   Lab Results  Component Value Date   PLT 468* 11/05/2015    Antibiotics  :     Anti-infectives    Start     Dose/Rate Route Frequency Ordered Stop   11/06/15 1800  vancomycin (VANCOCIN) IVPB 1000 mg/200 mL premix     1,000 mg 200 mL/hr over 60 Minutes Intravenous Every [name]-F (Hemodialysis) 11/06/15 0825     11/05/15 1400  azithromycin (ZITHROMAX) 500 mg in dextrose 5 % 250 mL IVPB     500 mg 250 mL/hr over 60 Minutes Intravenous Every 24 hours 11/05/15 1153     11/04/15 1000  piperacillin-tazobactam (ZOSYN) IVPB 2.25 g     2.25 g 100 mL/hr over 30 Minutes Intravenous Every 8 hours 11/04/15 0902     11/04/15 0900  cefTRIAXone (ROCEPHIN) 1 g in dextrose 5 % 50 mL IVPB  Status:  Discontinued    Comments:  From time of last dose outside hospital   1 g 100 mL/hr over 30 Minutes Intravenous Every 24 hours 11/04/15 0811 11/04/15 0844   11/04/15 0900  azithromycin (ZITHROMAX) 500 mg in dextrose 5 % 250 mL IVPB  Status:  Discontinued    Comments:  From time of last dose outside hospital   500 mg 250 mL/hr over 60 Minutes Intravenous Every 24 hours 11/04/15 0811 11/04/15 0844   11/04/15 0845  piperacillin-tazobactam (ZOSYN) IVPB 3.375 g  Status:  Discontinued    Comments:  From time received ouside er/ 11/04/15  At 315am   3.375 g 100 mL/hr over 30 Minutes Intravenous Every 8 hours 11/04/15 0844 11/04/15 0902        Objective:   Filed Vitals:   11/05/15 1743 11/05/15 2256 11/06/15 0442 11/06/15 0855  BP: 102/55 132/64 131/61  146/61  Pulse: 78 81 84 84  Temp: 97.4 F (36.3 C) 97.6 F (36.4 C) 97.5 F (36.4 C) 97.5 F (36.4 C)  TempSrc: Oral Oral Oral Oral  Resp:  18 16 18   Weight:   106.8 kg (235 lb 7.2 oz)   SpO2: 89% 93% 91% 93%    Wt Readings from Last 3 Encounters:  11/06/15 106.8 kg (235 lb 7.2 oz)  06/20/15 106.1 kg (233 lb 14.5 oz)  05/16/14 108.863 kg (240 lb)     Intake/Output Summary (Last 24 hours) at 11/06/15 1214 Last data filed at 11/06/15 [id number]  Gross per 24 hour  Intake    150 ml  Output      0 ml  Net    150 ml     Physical Exam  Awake Alert, Oriented X 3, No new F.N deficits, Normal affect [place].AT,PERRAL Supple Neck,No JVD, No cervical lymphadenopathy appriciated.  Symmetrical Chest wall movement, Good air movement bilaterally, CTAB RRR,No Gallops,Rubs or new Murmurs, No Parasternal Heave +ve B.Sounds, Abd Soft but +ve RUQ tendernes, No organomegaly appriciated, No rebound - guarding or rigidity. No Cyanosis, Clubbing or edema, No new Rash or bruise       Data Review:    CBC  Recent Labs Lab 11/04/15 0837 11/05/15 0457  WBC 19.4* 20.1*  HGB 9.5* 9.6*  HCT 29.5* 29.0*  PLT 405* 468*  MCV 79.7 79.9  MCH 25.7* 26.4  MCHC 32.2 33.1  RDW 17.5* 17.6*  LYMPHSABS 0.6*  --   MONOABS 0.7  --   EOSABS 0.0  --   BASOSABS 0.0  --     Chemistries   Recent Labs Lab 11/04/15 0837 11/05/15 0457  NA 135 134*  K 3.9 4.2  CL 94* 90*  CO2 21* 21*  GLUCOSE 127* 113*  BUN 66* 86*  CREATININE 9.01* 10.95*  CALCIUM 7.7* 7.4*  MG 2.2  --   AST 36  --   ALT 24  --   ALKPHOS 158*  --   BILITOT 1.2  --    ------------------------------------------------------------------------------------------------------------------ No results for input(s): CHOL, HDL, LDLCALC, TRIG, CHOLHDL, LDLDIRECT in the last 72 hours.  Lab Results  Component Value Date   HGBA1C 5.4 06/20/2015    ------------------------------------------------------------------------------------------------------------------  Recent Labs  11/04/15 0837  TSH 1.304  1.162   ------------------------------------------------------------------------------------------------------------------ No results for input(s): VITAMINB12, FOLATE, FERRITIN, TIBC, IRON, RETICCTPCT in the last 72 hours.  Coagulation profile  Recent Labs Lab 11/04/15 0837  INR 1.36    No results for input(s): DDIMER in the last 72 hours.  Cardiac Enzymes No results for input(s): CKMB, TROPONINI, MYOGLOBIN in the last 168 hours.  Invalid input(s): CK ------------------------------------------------------------------------------------------------------------------    Component Value Date/Time   BNP 192.0* 11/04/2015 0837    Inpatient Medications  Scheduled Meds: . amLODipine  10 mg Oral Daily  . aspirin EC  81 mg Oral Daily  . azithromycin  500 mg Intravenous Q24H  . buPROPion  150 mg Oral BID  . calcitRIOL  0.5 mcg Oral Daily  . cinacalcet  60 mg Oral Q breakfast  . [START ON 11/08/2015] darbepoetin (ARANESP) injection - DIALYSIS  200 mcg Intravenous Q Wed-HD  . escitalopram  20 mg Oral Daily  . feeding supplement (NEPRO CARB STEADY)  237 mL Oral BID BM  . ferric gluconate (FERRLECIT/NULECIT) IV  125 mg Intravenous Q M,W,F-HD  . heparin  5,000 Units Subcutaneous BID  . hydrALAZINE  100 mg Oral BID  . labetalol  150 mg Oral BID  . pantoprazole  40 mg Oral Daily  . piperacillin-tazobactam (ZOSYN)  IV  2.25 g Intravenous Q8H  . pravastatin  20 mg Oral q1800  . sodium chloride flush  3 mL Intravenous Q12H  . vancomycin  1,000 mg Intravenous Q M,W,F-HD   Continuous Infusions:  PRN Meds:.ALPRAZolam, fentaNYL (SUBLIMAZE) injection, gi cocktail, HYDROcodone-acetaminophen, traMADol  Micro Results Recent Results (from the past 240 hour(s))  Culture, blood (routine x 2) Call MD if unable to obtain prior to  antibiotics being given     Status: None (Preliminary result)   Collection Time: 11/04/15  8:50 AM  Result Value Ref Range Status   Specimen Description BLOOD RIGHT HAND  Final   Special Requests IN PEDIATRIC BOTTLE 1CC  Final   Culture NO GROWTH 1 DAY  Final   Report Status PENDING  Incomplete  Culture, blood (routine x 2) Call MD if unable to obtain prior to antibiotics being given     Status: None (Preliminary result)   Collection Time: 11/04/15  9:00 AM  Result Value Ref Range Status   Specimen Description BLOOD RIGHT HAND  Final   Special Requests IN PEDIATRIC BOTTLE [place]  Final   Culture NO GROWTH 1 DAY  Final   Report Status PENDING  Incomplete    Radiology Reports Ct Abdomen Pelvis Wo Contrast  11/06/2015  CLINICAL DATA:  Right lower quadrant pain.  Fever EXAM: CT ABDOMEN AND PELVIS WITHOUT CONTRAST TECHNIQUE: Multidetector CT imaging of the abdomen and pelvis was performed following the standard protocol without IV contrast. COMPARISON:  CT 05/16/2014 FINDINGS: Lower chest: Right lower lobe infiltrate with air bronchograms. Probable pneumonia. Negative for pleural effusion. Left lung base clear. Hepatobiliary: Unenhanced images  of the liver are negative. Markedly distended gallbladder with increased density in the gallbladder lumen suggesting sludge or hemorrhage or cholecystitis. There is mild edema surrounding the gallbladder neck consistent with acute inflammation. Bile ducts nondilated. Pancreas: Negative Spleen: Negative Adrenals/Urinary Tract: Negative for renal obstruction. 3 mm nonobstructing stone right lower pole. Fatty lesion left adrenal gland is unchanged may represent lipoma or myelo lipoma. Urinary bladder normal. Stomach/Bowel: Oral contrast is present in the stomach and small bowel extending into the colon and rectum. Small bowel mildly distended with air-fluid levels. This may represent ileus or low-grade obstruction. Normal appendix. There is prominent thickening of the  duodenum in the second and third portions. There is stranding in the adjacent fat around the duodenum. This could represent inflammatory changes from peptic ulcer disease or duodenitis. Carcinoma cannot be excluded. Duodenal thickening is near the distended gallbladder however these may be two separate processes. Vascular/Lymphatic: Atherosclerotic calcification in the aorta and iliac arteries. No aneurysm. No lymphadenopathy. Reproductive: Negative prostate.  No bladder wall thickening. Other: No free-fluid Musculoskeletal: Disc degeneration and spurring L4-5 and L5-S1. No fracture or bony mass lesion. IMPRESSION: Markedly distended gallbladder with diffusely increased density in the gallbladder. This could represent acute cholecystitis versus poor functioning of the gallbladder filled with sludge. Thickening of the second and third portions of the duodenum with surrounding edema in the adjacent fat. This may represent peptic ulcer disease or duodenitis. Carcinoma not excluded. Small bowel is mildly distended with air-fluid levels suggesting ileus or partial small obstruction. Oral contrast extends into the rectum Right lower lobe infiltrate, probable pneumonia. Normal appendix Electronically Signed   By: Franchot Gallo M.D.   On: 11/06/2015 08:12   Dg Chest 2 View  11/04/2015  CLINICAL DATA:  Pneumonia. EXAM: CHEST  2 VIEW COMPARISON:  06/20/2015 FINDINGS: Focal airspace disease in the right lower lobe which is new from prior. No cavitation or effusion. Generous heart size without definitive cardiomegaly. Stable aortic and hilar contours. IMPRESSION: Right lower lobe pneumonia. Followup PA and lateral chest X-ray is recommended in 3-4 weeks following trial of antibiotic therapy to ensure resolution. Electronically Signed   By: Monte Fantasia M.D.   On: 11/04/2015 10:14   Dg Abd 1 View  11/04/2015  CLINICAL DATA:  Two day history of abdominal pain EXAM: ABDOMEN - 1 VIEW COMPARISON:  CT abdomen and pelvis  May 16, 2014 FINDINGS: There is no appreciable bowel dilatation or air-fluid level suggesting obstruction. No free air is seen on this supine examination. There are vascular calcifications in the pelvis. IMPRESSION: No demonstrable bowel obstruction or free air. Electronically Signed   By: Lowella Grip III M.D.   On: 11/04/2015 10:13   US Abdomen Limited Ruq  11/06/2015  CLINICAL DATA:  Right upper quadrant pain for 4 days EXAM: US ABDOMEN LIMITED - RIGHT UPPER QUADRANT COMPARISON:  CT abdomen and pelvis Nov 05, 2015 FINDINGS: Gallbladder: Gallbladder appears distended with wall thickening and wall edema. There is sludge in the gallbladder with small intermingled gallstones. The patient is focally tender over the gallbladder. There is equivocal pericholecystic fluid. Common bile duct: Diameter: 4 mm. There is no intrahepatic or extrahepatic biliary duct dilatation. Liver: No focal lesion identified. Within normal limits in parenchymal echogenicity. IMPRESSION: Findings as described above consistent with a degree of acute cholecystitis. These results will be called to the ordering clinician or representative by the Radiologist Assistant, and communication documented in the PACS or zVision Dashboard. Electronically Signed   By: Lowella Grip  III M.D.   On: 11/06/2015 10:10    Time Spent in minutes  30   SINGH,PRASHANT K M.D on 11/06/2015 at 12:14 PM  Between 7am to 7pm - Pager - [phone]  After 7pm go to [email or web address] - password [facility]  [name] -  Office  [phone]

## 2015-11-06 NOTE — Consult Note (Signed)
Reason for Consult: cholecystitis  Referring Physician: Dr. Lala Lund   HPI: Hayden [name] is a 45 year old male with a history of ESRD on HD, HTN, CHF, stroke, admitted with pneumonia and abdominal pain.  The patient reports developing muscle aches and malaise on Monday which resolved by Wednesday, but had persistent abdominal pain to his lower abdomen.  Associated with nausea, 1 episode of emesis.  He endorses to epigastric abdominal pain after meals which would resolve in the past. Endorses to fevers of 102. No sob, chest pains or palpitations, or a productive cough.  CT of abdomen and pelvis showed a distended gallbladder, thickening of duodenum, RLL infiltrate. Abdominal US shows gallbladder thickening, distention and edema.  We have therefore been asked to evaluate.   Past Medical History  Diagnosis Date  . History of kidney cancer   . Hypertension   . CHF (congestive heart failure) ([place]) 4944    Acute systolic and diastolic CHF  . Renal disorder   . Obesity   . Hyperlipidemia   . Anemia March 2014  . Cancer ([place])     Kidney  . PONV (postoperative nausea and vomiting)   . Depression     & rage --  was in counseling....great now  . Diabetes mellitus     NO DM SINCE LOST 130LBS  . Stroke [facility])     Past Surgical History  Procedure Laterality Date  . Nephrectomy Right 2008    partial  . Testicle torsion reduction    . Hernia repair    . Tonsillectomy and adenoidectomy    . Av fistula placement Left 01/26/2013    Procedure: ARTERIOVENOUS (AV) FISTULA CREATION - LEFT RADIAL CEPHALIC AVF;  Surgeon: Angelia Mould, MD;  Location: [facility] OR;  Service: Vascular;  Laterality: Left;    Family History  Problem Relation Age of Onset  . Heart disease Mother     Heart Disease before age 39  . Deep vein thrombosis Father   . Heart attack Father   . Other Other     Social History:  reports that he has been smoking Cigarettes.  He has a 2.5 pack-year smoking history. He has  never used smokeless tobacco. He reports that he uses illicit drugs (Marijuana). He reports that he does not drink alcohol.  Allergies:  Allergies  Allergen Reactions  . Dilaudid [Hydromorphone Hcl] Other (See Comments)    Abnormal behavior  . Morphine And Related Itching    Medications:  Scheduled Meds: . amLODipine  10 mg Oral Daily  . aspirin EC  81 mg Oral Daily  . azithromycin  500 mg Intravenous Q24H  . buPROPion  150 mg Oral BID  . calcitRIOL  0.5 mcg Oral Daily  . cinacalcet  60 mg Oral Q breakfast  . escitalopram  20 mg Oral Daily  . feeding supplement (NEPRO CARB STEADY)  237 mL Oral BID BM  . heparin  5,000 Units Subcutaneous BID  . hydrALAZINE  100 mg Oral BID  . labetalol  150 mg Oral BID  . pantoprazole  40 mg Oral Daily  . piperacillin-tazobactam (ZOSYN)  IV  2.25 g Intravenous Q8H  . pravastatin  20 mg Oral q1800  . sodium chloride flush  3 mL Intravenous Q12H  . vancomycin  1,000 mg Intravenous Q M,W,F-HD   Continuous Infusions:  PRN Meds:.ALPRAZolam, fentaNYL (SUBLIMAZE) injection, gi cocktail, HYDROcodone-acetaminophen, traMADol   Results for orders placed or performed during the hospital encounter of 11/04/15 (from the past 48  hour(s))  Urinalysis, Routine w reflex microscopic (not at [facility])     Status: Abnormal   Collection Time: 11/04/15  7:30 PM  Result Value Ref Range   Color, Urine AMBER (A) YELLOW    Comment: BIOCHEMICALS MAY BE AFFECTED BY COLOR   APPearance TURBID (A) CLEAR   Specific Gravity, Urine 1.019 1.005 - 1.030   pH 5.5 5.0 - 8.0   Glucose, UA 100 (A) NEGATIVE mg/dL   Hgb urine dipstick NEGATIVE NEGATIVE   Bilirubin Urine NEGATIVE NEGATIVE   Ketones, ur NEGATIVE NEGATIVE mg/dL   Protein, ur >300 (A) NEGATIVE mg/dL   Nitrite NEGATIVE NEGATIVE   Leukocytes, UA NEGATIVE NEGATIVE  Urine microscopic-add on     Status: Abnormal   Collection Time: 11/04/15  7:30 PM  Result Value Ref Range   Squamous Epithelial / LPF 6-30 (A) NONE SEEN    WBC, UA 0-5 0 - 5 WBC/hpf   RBC / HPF NONE SEEN 0 - 5 RBC/hpf   Bacteria, UA MANY (A) NONE SEEN   Casts GRANULAR CAST (A) NEGATIVE  Basic metabolic panel     Status: Abnormal   Collection Time: 11/05/15  4:57 AM  Result Value Ref Range   Sodium 134 (L) 135 - 145 mmol/L   Potassium 4.2 3.5 - 5.1 mmol/L   Chloride 90 (L) 101 - 111 mmol/L   CO2 21 (L) 22 - 32 mmol/L   Glucose, Bld 113 (H) 65 - 99 mg/dL   BUN 86 (H) 6 - 20 mg/dL   Creatinine, Ser 10.95 (H) 0.61 - 1.24 mg/dL   Calcium 7.4 (L) 8.9 - 10.3 mg/dL   GFR calc non Af Amer 5 (L) >60 mL/min   GFR calc Af Amer 6 (L) >60 mL/min    Comment: (NOTE) The eGFR has been calculated using the CKD EPI equation. This calculation has not been validated in all clinical situations. eGFR's persistently <60 mL/min signify possible Chronic Kidney Disease.    Anion gap 23 (H) 5 - 15    Comment: RESULT CHECKED  CBC     Status: Abnormal   Collection Time: 11/05/15  4:57 AM  Result Value Ref Range   WBC 20.1 (H) 4.0 - 10.5 K/uL   RBC 3.63 (L) 4.22 - 5.81 MIL/uL   Hemoglobin 9.6 (L) 13.0 - 17.0 g/dL   HCT 29.0 (L) 39.0 - 52.0 %   MCV 79.9 78.0 - 100.0 fL   MCH 26.4 26.0 - 34.0 pg   MCHC 33.1 30.0 - 36.0 g/dL   RDW 17.6 (H) 11.5 - 15.5 %   Platelets 468 (H) 150 - 400 K/uL    Ct Abdomen Pelvis Wo Contrast  11/06/2015  CLINICAL DATA:  Right lower quadrant pain.  Fever EXAM: CT ABDOMEN AND PELVIS WITHOUT CONTRAST TECHNIQUE: Multidetector CT imaging of the abdomen and pelvis was performed following the standard protocol without IV contrast. COMPARISON:  CT 05/16/2014 FINDINGS: Lower chest: Right lower lobe infiltrate with air bronchograms. Probable pneumonia. Negative for pleural effusion. Left lung base clear. Hepatobiliary: Unenhanced images of the liver are negative. Markedly distended gallbladder with increased density in the gallbladder lumen suggesting sludge or hemorrhage or cholecystitis. There is mild edema surrounding the gallbladder neck  consistent with acute inflammation. Bile ducts nondilated. Pancreas: Negative Spleen: Negative Adrenals/Urinary Tract: Negative for renal obstruction. 3 mm nonobstructing stone right lower pole. Fatty lesion left adrenal gland is unchanged may represent lipoma or myelo lipoma. Urinary bladder normal. Stomach/Bowel: Oral contrast is present in the stomach  and small bowel extending into the colon and rectum. Small bowel mildly distended with air-fluid levels. This may represent ileus or low-grade obstruction. Normal appendix. There is prominent thickening of the duodenum in the second and third portions. There is stranding in the adjacent fat around the duodenum. This could represent inflammatory changes from peptic ulcer disease or duodenitis. Carcinoma cannot be excluded. Duodenal thickening is near the distended gallbladder however these may be two separate processes. Vascular/Lymphatic: Atherosclerotic calcification in the aorta and iliac arteries. No aneurysm. No lymphadenopathy. Reproductive: Negative prostate.  No bladder wall thickening. Other: No free-fluid Musculoskeletal: Disc degeneration and spurring L4-5 and L5-S1. No fracture or bony mass lesion. IMPRESSION: Markedly distended gallbladder with diffusely increased density in the gallbladder. This could represent acute cholecystitis versus poor functioning of the gallbladder filled with sludge. Thickening of the second and third portions of the duodenum with surrounding edema in the adjacent fat. This may represent peptic ulcer disease or duodenitis. Carcinoma not excluded. Small bowel is mildly distended with air-fluid levels suggesting ileus or partial small obstruction. Oral contrast extends into the rectum Right lower lobe infiltrate, probable pneumonia. Normal appendix Electronically Signed   By: Franchot Gallo M.D.   On: 11/06/2015 08:12   US Abdomen Limited Ruq  11/06/2015  CLINICAL DATA:  Right upper quadrant pain for 4 days EXAM: US ABDOMEN  LIMITED - RIGHT UPPER QUADRANT COMPARISON:  CT abdomen and pelvis Nov 05, 2015 FINDINGS: Gallbladder: Gallbladder appears distended with wall thickening and wall edema. There is sludge in the gallbladder with small intermingled gallstones. The patient is focally tender over the gallbladder. There is equivocal pericholecystic fluid. Common bile duct: Diameter: 4 mm. There is no intrahepatic or extrahepatic biliary duct dilatation. Liver: No focal lesion identified. Within normal limits in parenchymal echogenicity. IMPRESSION: Findings as described above consistent with a degree of acute cholecystitis. These results will be called to the ordering clinician or representative by the Radiologist Assistant, and communication documented in the PACS or zVision Dashboard. Electronically Signed   By: Lowella Grip III M.D.   On: 11/06/2015 10:10    Review of Systems  Constitutional: Positive for fever, chills, malaise/fatigue and diaphoresis.  Respiratory: Negative for cough, hemoptysis, sputum production and shortness of breath.   Cardiovascular: Negative for chest pain, palpitations, orthopnea, claudication, leg swelling and PND.  Gastrointestinal: Positive for nausea and abdominal pain. Negative for heartburn, vomiting, diarrhea, constipation, blood in stool and melena.  Genitourinary: Negative for dysuria.  Neurological: Negative for dizziness, tingling, tremors, sensory change, speech change, focal weakness, seizures and loss of consciousness.  Endo/Heme/Allergies: Negative for environmental allergies and polydipsia. Does not bruise/bleed easily.  Psychiatric/Behavioral: Negative for depression, suicidal ideas, hallucinations, memory loss and substance abuse. The patient is not nervous/anxious and does not have insomnia.    Blood pressure 146/61, pulse 84, temperature 97.5 F (36.4 C), temperature source Oral, resp. rate 18, weight 106.8 kg (235 lb 7.2 oz), SpO2 93 %. Physical Exam  Constitutional: He  is oriented to person, place, and time. He appears well-developed and well-nourished. No distress.  Cardiovascular: Normal rate, regular rhythm, normal heart sounds and intact distal pulses.  Exam reveals no gallop and no friction rub.   No murmur heard. Respiratory: Effort normal and breath sounds normal. No respiratory distress. He has no wheezes. He has no rales. He exhibits no tenderness.  GI: Soft. Bowel sounds are normal. He exhibits no mass. There is no rebound and no guarding.  Mild ttp ruq, moderate ttp rlq.  Musculoskeletal: Normal range of motion. He exhibits no edema or tenderness.  Neurological: He is alert and oriented to person, place, and time.  Skin: Skin is warm and dry. No rash noted. He is not diaphoretic. No erythema. No pallor.  Left arm fistula  Psychiatric: He has a normal mood and affect. His behavior is normal. Judgment and thought content normal.    Assessment/Plan: Acute calculous cholecystitis-to OR for lap chole today.   Duodenitis-not sure if this is secondary to gallbladder, will monitor. ID-Vanc and zosyn for PNA, adequate coverage for gb FEN-NPO, add IV pain meds MMP-per primary team  [place], [place] ANP-BC  11/06/2015, 10:37 AM

## 2015-11-06 NOTE — Evaluation (Signed)
Occupational Therapy Evaluation Patient Details Name: Hayden [name] MRN: [id number] DOB: [birth year] Today's Date: 11/06/2015    History of Present Illness Patient is a 45 yo male admitted 11/04/15 with abdominal pain and recent viral illness.  Patient with RLL pna.     PMH:  ESRD on HD M, W, F, hypertension, CVA (lacunar infarct) in 2016   Clinical Impression   Pt admitted with the above diagnosis and overall is at baseline with adls. Pt was in great pain this am and not wanting to get up and move.  Pt stated he goes to bathroom himself and has no problems bathing and dressing himself. Pt is not interested in further OT at this time.  Will talk to PT when they mobilize to confirm.  Will sign off at this time.     Follow Up Recommendations  No OT follow up;Supervision - Intermittent    Equipment Recommendations  None recommended by OT    Recommendations for Other Services       Precautions / Restrictions Precautions Precautions: None Restrictions Weight Bearing Restrictions: No      Mobility Bed Mobility                  Transfers   Equipment used: None                  Balance                                            ADL Overall ADL's : At baseline                                       General ADL Comments: Pt reports his adls are at baseline.  Pt has been walking to and from the bathroom [place] and dressing and bathing with set up.  Pt in a lot of pain this am and not able to get up and move due to the pain. Pt states he is fine with his self care and is not in need of OT services.     Vision     Perception     Praxis      Pertinent Vitals/Pain Pain Assessment: 0-10 Pain Score: 8  Pain Location: lower abdomen Pain Descriptors / Indicators: Aching Pain Intervention(s): Premedicated before session;Limited activity within patient's tolerance;Repositioned     Hand Dominance Right   Extremity/Trunk  Assessment Upper Extremity Assessment Upper Extremity Assessment: Overall WFL for tasks assessed   Lower Extremity Assessment Lower Extremity Assessment: Defer to PT evaluation   Cervical / Trunk Assessment Cervical / Trunk Assessment: Normal   Communication Communication Communication: No difficulties   Cognition Arousal/Alertness: Awake/alert Behavior During Therapy: WFL for tasks assessed/performed Overall Cognitive Status: Within Functional Limits for tasks assessed                     General Comments       Exercises       Shoulder Instructions      Home Living Family/patient expects to be discharged to:: Private residence Living Arrangements: Spouse/significant other;Children Available Help at Discharge: Family;Available 24 hours/day Type of Home: Mobile home Home Access: Stairs to enter Entrance Stairs-Number of Steps: 4 Entrance Stairs-Rails: Right;Left;Can reach both Home Layout: One level  Bathroom Shower/Tub: Gaffer;Door   [employer] Toilet: Standard Bathroom Accessibility: Yes   Home Equipment: Cane - single point;Grab bars - tub/shower;Hand held shower head   Additional Comments: sits in bottom of tub      Prior Functioning/Environment Level of Independence: Independent        Comments: Driving not currently working.  Wife assists patient with home HD.    OT Diagnosis:     OT Problem List:     OT Treatment/Interventions:      OT Goals(Current goals can be found in the care plan section) Acute Rehab OT Goals Patient Stated Goal: To get stronger OT Goal Formulation: All assessment and education complete, DC therapy  OT Frequency:     Barriers to D/C:            Co-evaluation              End of Session Nurse Communication: Mobility status  Activity Tolerance: Patient limited by pain Patient left: in bed;with call bell/phone within reach   Time: 1042-1050 OT Time Calculation (min): 8 min Charges:  OT  General Charges $OT Visit: 1 Procedure OT Evaluation $OT Eval Low Complexity: 1 Procedure G-Codes:    Glenford Peers 2015-11-29, 10:56 AM  [phone]

## 2015-11-06 NOTE — Anesthesia Procedure Notes (Signed)
Procedure Name: Intubation Date/Time: 11/06/2015 3:55 PM Performed by: Rebekah Chesterfield L Pre-anesthesia Checklist: Patient identified, Emergency Drugs available, Suction available and Patient being monitored Patient Re-evaluated:Patient Re-evaluated prior to inductionOxygen Delivery Method: Circle System Utilized Preoxygenation: Pre-oxygenation with 100% oxygen Intubation Type: IV induction Ventilation: Mask ventilation with difficulty and Oral airway inserted - appropriate to patient size Laryngoscope Size: Mac and 4 Tube type: Oral Tube size: 8.0 mm Number of attempts: 1 Airway Equipment and Method: Stylet Placement Confirmation: ETT inserted through vocal cords under direct vision,  positive ETCO2 and breath sounds checked- equal and bilateral Secured at: 21 cm Tube secured with: Tape Dental Injury: Teeth and Oropharynx as per pre-operative assessment

## 2015-11-06 NOTE — Progress Notes (Signed)
Initial Nutrition Assessment  DOCUMENTATION CODES:   Obesity unspecified  INTERVENTION:  -Nepro BID. Each supplement provides 425 kcals and 19 grams of protein. -Continue to monitor for nutritional needs   NUTRITION DIAGNOSIS:   Increased nutrient needs related to chronic illness as evidenced by estimated needs.  GOAL:   Patient will meet greater than or equal to 90% of their needs  MONITOR:   PO intake, Supplement acceptance, Labs, Weight trends, Skin, I & O's  REASON FOR ASSESSMENT:   Malnutrition Screening Tool    ASSESSMENT:   45 y.o. male with end-stage renal disease on home hemodialysis, Hypertension, lacunar infarcts in 06/2015, answered spell outside hospital ER [place], [place] [facility] ER) with community-acquired pneumonia. Of note, he stated he had some viral flulike illness, Monday through Wednesday with nonproductive cough, low-grade temperatures 101-102. Started feeling better until Wednesday afternoon started having severe abdominal pain with mild nausea, described as a burning sensation.   Pt not in room at time of visit. Per chart, pt has not experienced any weight loss in the past 6 months. Pt NPO at this time, no intake recorded. Unable to speak with pt, will attempt to obtain more information at f/u.   Unable to perform NFPE at this time. Will attempt at f/u.   Labs reviewed; Na 134, Cl 90, BUN 86, creat 10.95, Ca 7.4, GFR 5, CBGs 91.  Meds reviewed.  Diet Order:  Diet NPO time specified  Skin:  Reviewed, no issues  Last BM:  5/6  Height:   Ht Readings from Last 1 Encounters:  06/19/15 5\' 10"  (1.778 m)    Weight:   Wt Readings from Last 1 Encounters:  11/06/15 235 lb 7.2 oz (106.8 kg)    Ideal Body Weight:  75.5 kg  BMI:  Body mass index is 33.78 kg/(m^2).  Estimated Nutritional Needs:   Kcal:  2400-2500 (33-35 kcals/kg IBW)  Protein:  121 (1.2 g/ABW)  Fluid:  2L  EDUCATION NEEDS:   No education needs identified at this  time  Geoffery Lyons, [place] Dietetic Intern Pager [phone]

## 2015-11-06 NOTE — Progress Notes (Signed)
PT Cancellation Note  Patient Details Name: Hayden [name] MRN: [id number] DOB: 1970-12-09   Cancelled Treatment:    Reason Eval/Treat Not Completed: Pain limiting ability to participate   Politely declining PT at this time;   Will follow up later today as time allows, noted for a procedure later,  Otherwise, will follow up for PT tomorrow;   Thank you,  Roney Marion, PT  Acute Rehabilitation Services Pager [phone] Office [phone]    Roney Marion [facility] 11/06/2015, 12:03 PM

## 2015-11-07 ENCOUNTER — Encounter (HOSPITAL_COMMUNITY): Payer: Self-pay | Admitting: General Surgery

## 2015-11-07 LAB — CBC
HCT: 24.7 % — ABNORMAL LOW (ref 39.0–52.0)
Hemoglobin: 8 g/dL — ABNORMAL LOW (ref 13.0–17.0)
MCH: 24.7 pg — ABNORMAL LOW (ref 26.0–34.0)
MCHC: 32.4 g/dL (ref 30.0–36.0)
MCV: 76.2 fL — AB (ref 78.0–100.0)
PLATELETS: 474 10*3/uL — AB (ref 150–400)
RBC: 3.24 MIL/uL — AB (ref 4.22–5.81)
RDW: 17.7 % — AB (ref 11.5–15.5)
WBC: 16.8 10*3/uL — AB (ref 4.0–10.5)

## 2015-11-07 LAB — COMPREHENSIVE METABOLIC PANEL
ALBUMIN: 1.9 g/dL — AB (ref 3.5–5.0)
ALT: 36 U/L (ref 17–63)
AST: 52 U/L — AB (ref 15–41)
Alkaline Phosphatase: 235 U/L — ABNORMAL HIGH (ref 38–126)
Anion gap: 28 — ABNORMAL HIGH (ref 5–15)
BUN: 155 mg/dL — AB (ref 6–20)
CHLORIDE: 90 mmol/L — AB (ref 101–111)
CO2: 16 mmol/L — AB (ref 22–32)
CREATININE: 15.32 mg/dL — AB (ref 0.61–1.24)
Calcium: 6.5 mg/dL — ABNORMAL LOW (ref 8.9–10.3)
GFR calc Af Amer: 4 mL/min — ABNORMAL LOW (ref >60)
GFR, EST NON AFRICAN AMERICAN: 3 mL/min — AB (ref >60)
Glucose, Bld: 88 mg/dL (ref 65–99)
POTASSIUM: 4.1 mmol/L (ref 3.5–5.1)
SODIUM: 134 mmol/L — AB (ref 135–145)
Total Bilirubin: 1.1 mg/dL (ref 0.3–1.2)
Total Protein: 6.1 g/dL — ABNORMAL LOW (ref 6.5–8.1)

## 2015-11-07 MED ORDER — VANCOMYCIN HCL IN DEXTROSE 1-5 GM/200ML-% IV SOLN
1000.0000 mg | Freq: Once | INTRAVENOUS | Status: AC
  Administered 2015-11-07: 1000 mg via INTRAVENOUS

## 2015-11-07 MED ORDER — VANCOMYCIN HCL IN DEXTROSE 1-5 GM/200ML-% IV SOLN
INTRAVENOUS | Status: AC
  Filled 2015-11-07: qty 200

## 2015-11-07 MED ORDER — VANCOMYCIN HCL IN DEXTROSE 1-5 GM/200ML-% IV SOLN
1000.0000 mg | INTRAVENOUS | Status: DC
Start: 2015-11-08 — End: 2015-11-07

## 2015-11-07 NOTE — Progress Notes (Signed)
Nutrition Follow-up  DOCUMENTATION CODES:   Obesity unspecified  INTERVENTION:  -D/c Nepro.  NUTRITION DIAGNOSIS:   Increased nutrient needs related to chronic illness as evidenced by estimated needs.  ongoing  GOAL:   Patient will meet greater than or equal to 90% of their needs  met  MONITOR:   PO intake, Supplement acceptance, Labs, Weight trends, Skin, I & O's  ASSESSMENT:   45 y.o. male with end-stage renal disease on home hemodialysis, Hypertension, lacunar infarcts in 06/2015, answered spell outside hospital ER [place], [place] [facility] ER) with community-acquired pneumonia. Of note, he stated he had some viral flulike illness, Monday through Wednesday with nonproductive cough, low-grade temperatures 101-102. Started feeling better until Wednesday afternoon started having severe abdominal pain with mild nausea, described as a burning sensation.   Pt seen for f/u. S/p cholecystectomy.  Pt reports UBW of 215-220 lbs for over a year. Pt diet recall reveals eating 2-3 times per day. Pt experienced N/V 2 days PT PTA. Pt reports poor appetite for a couple months but no weight loss. Pt reports drinking several Nepro but requests to discontinue it. Pt is eating food brought to him from outside the hospital. At bedside, there were 2 takeout trays with some french fries remaining. Pt estimates he is eating 70-80% of meals.   NFPE: no muscle or fat depletion, no edema.  Labs reviewed; Na 134, Cl 90, BUN 155, creat 15.32, Ca 6.5, GFR 3. Meds reviewed.  Diet Order:  Diet renal with fluid restriction Fluid restriction:: 2000 mL Fluid; Room service appropriate?: Yes; Fluid consistency:: Thin  Skin:  Reviewed, no issues  Last BM:  5/6  Height:   Ht Readings from Last 1 Encounters:  06/19/15 5' 10"  (1.778 m)    Weight:   Wt Readings from Last 1 Encounters:  11/07/15 224 lb 13.9 oz (102 kg)    Ideal Body Weight:  75.5 kg  BMI:  Body mass index is 32.27  kg/(m^2).  Estimated Nutritional Needs:   Kcal:  2400-2500 (33-35 kcals/kg IBW)  Protein:  121 (1.2 g/ABW)  Fluid:  2L  EDUCATION NEEDS:   No education needs identified at this time  Hayden [name], [place] Dietetic Intern Pager [phone]

## 2015-11-07 NOTE — Progress Notes (Signed)
Pharmacy Antibiotic Note  Hayden [name] is a 45 y.o. male admitted on 11/04/2015 with pneumonia.  Pharmacy has been consulted for vancomycin dosing. Pharmacy also consulted to adjust doses of antibiotics. Zosyn adjusted for his ESRD/HD.  Per [place] hospital from where pt was transferred from, last vanc dose was 5/6 at 0330. VR checked 5/6 and was within range at 23. OR for lap chole 5/8 -held HD until this morning. In HD now and will get dose now. Will schedule vancomycin to be given post-HD MWF starting tomorrow  Plan: - Giving vancomycin 1 gram IV  X 1 in HD today - Start vancomycin 1 gram IV post-HD on MWF - Continue Zosyn to 2.25 gm IV q8h for HD dose - Monitor clinical progress, c/s, renal function, abx plan/LOT, Vanc levels as indicated  Weight: 224 lb 13.9 oz (102 kg)  Temp (24hrs), Avg:97.8 F (36.6 C), Min:97.4 F (36.3 C), Max:98.1 F (36.7 C)   Recent Labs Lab 11/04/15 0837 11/04/15 0856 11/05/15 0457 11/07/15 0843  WBC 19.4*  --  20.1* 16.8*  CREATININE 9.01*  --  10.95* 15.32*  VANCORANDOM  --  23  --   --     Estimated Creatinine Clearance: 7.4 mL/min (by C-G formula based on Cr of 15.32).    Allergies  Allergen Reactions  . Dilaudid [Hydromorphone Hcl] Other (See Comments)    Abnormal behavior, "verbal and physically abusive", "psychosis"  . Morphine And Related Itching    Syncopal episode    Antimicrobials this admission: Zosyn 5/6 >> Vanc 5/6 >>  Dose adjustments this admission: 5/6 VR 23  Microbiology results: 5/6 BCx: ng x 2 d 5/6 MRSA PCR: negative   Thank you for allowing [place] to participate in this patients care. Jens Som, PharmD Pager: [phone]  11/07/2015 11:12 AM

## 2015-11-07 NOTE — Progress Notes (Signed)
PROGRESS NOTE                                                                                                                                                                                                             Patient Demographics:    Hayden [name], is a 45 y.o. male, DOB - [birth year], [id number]  Admit date - 11/04/2015   Admitting Physician Maren Reamer, MD  Outpatient Primary MD for the patient is Louis Meckel, MD  LOS - 3  Outpatient Specialists:  [facility]  No chief complaint on file.      Brief Narrative   Hayden [name] is a 45 y.o. male with end-stage renal disease on home hemodialysis, Hypertension, lacunar infarcts in 06/2015, answered spell outside hospital [facility] [place], [facility] ER) with community-acquired pneumonia. Of note, he stated he had some viral flulike illness, Monday through Wednesday with nonproductive cough, low-grade temperatures 101-102. Started feeling better until Wednesday afternoon started having severe abdominal pain with mild nausea, described as a burning sensation.   He was admitted with running diagnosis of pneumonia further workup On 11/06/2015 showed acute cholecystitis, I assumed his care on day 2 on 11/06/2015, Gen. surgery was consulted along with renal for dialysis. He underwent laparoscopic cholecystectomy on 11/06/2015.   Subjective:    [name] today has, No headache, No chest pain, Improved RUQ abdominal pain - No Nausea, No new weakness tingling or numbness, No Cough - SOB.     Assessment  & Plan :     1.Right upper quadrant abdominal Pain due to acute cholecystitis. Status post laparoscopic cholecystectomy by general surgery Dr. Lynden Ang is on 11/06/2015, his JP drain, diet per general surgery, continue IV Zosyn, stop or other antibiotics I do not think he has pneumonia which was a suspicion upon admission. Supportive care, discharge in  1-2 days once cleared by surgery.  2. ESRD. On Monday, Wednesday and Friday schedule. Renal on board.  3. Possibility of pneumonia. He really does not have any cough or shortness of breath. Continue Zosyn for #1 above. All other antibiotics.  4. Essential hypertension. Home medications continued would as needed IV Lopressor and hydralazine.  5. HX of stroke in the past. No acute issue. Supportive care. Continue aspirin and statin for secondary prevention.  6. Dyslipidemia. On statin  8. Anemia of chronic  disease along with perioperative blood loss treated anemia. Hemoglobin around 8 and stable, supportive care and monitor.   Code Status : Full  Family Communication  : None  Disposition Plan  : Home 2-3 days  Barriers For Discharge : Surgery for cholecystitis  Consults  :  Surgery  Procedures  :   CT Scan abdomen and pelvis and right upper quadrant ultrasound suggestive of acute cholecystitis.  Laparoscopic cholecystectomy on 11/06/2015  DVT Prophylaxis  :   Heparin   Lab Results  Component Value Date   PLT 474* 11/07/2015    Antibiotics  :     Anti-infectives    Start     Dose/Rate Route Frequency Ordered Stop   11/08/15 1200  vancomycin (VANCOCIN) IVPB 1000 mg/200 mL premix  Status:  Discontinued     1,000 mg 200 mL/hr over 60 Minutes Intravenous Every [name]-F (Hemodialysis) 11/07/15 1120 11/07/15 1126   11/07/15 1115  vancomycin (VANCOCIN) IVPB 1000 mg/200 mL premix     1,000 mg 200 mL/hr over 60 Minutes Intravenous  Once 11/07/15 1100     11/07/15 1105  vancomycin (VANCOCIN) 1-5 GM/200ML-% IVPB    Comments:  Cherylann Banas   : cabinet override      11/07/15 1105 11/07/15 2314   11/06/15 1800  vancomycin (VANCOCIN) IVPB 1000 mg/200 mL premix  Status:  Discontinued     1,000 mg 200 mL/hr over 60 Minutes Intravenous Every [name]-F (Hemodialysis) 11/06/15 0825 11/06/15 1238   11/05/15 1400  azithromycin (ZITHROMAX) 500 mg in dextrose 5 % 250 mL IVPB  Status:   Discontinued     500 mg 250 mL/hr over 60 Minutes Intravenous Every 24 hours 11/05/15 1153 11/07/15 1126   11/04/15 1000  piperacillin-tazobactam (ZOSYN) IVPB 2.25 g     2.25 g 100 mL/hr over 30 Minutes Intravenous Every 8 hours 11/04/15 0902     11/04/15 0900  cefTRIAXone (ROCEPHIN) 1 g in dextrose 5 % 50 mL IVPB  Status:  Discontinued    Comments:  From time of last dose outside hospital   1 g 100 mL/hr over 30 Minutes Intravenous Every 24 hours 11/04/15 0811 11/04/15 0844   11/04/15 0900  azithromycin (ZITHROMAX) 500 mg in dextrose 5 % 250 mL IVPB  Status:  Discontinued    Comments:  From time of last dose outside hospital   500 mg 250 mL/hr over 60 Minutes Intravenous Every 24 hours 11/04/15 0811 11/04/15 0844   11/04/15 0845  piperacillin-tazobactam (ZOSYN) IVPB 3.375 g  Status:  Discontinued    Comments:  From time received ouside er/ 11/04/15  At 315am   3.375 g 100 mL/hr over 30 Minutes Intravenous Every 8 hours 11/04/15 0844 11/04/15 0902        Objective:   Filed Vitals:   11/07/15 0900 11/07/15 0930 11/07/15 1000 11/07/15 1100  BP: 134/70 130/70 130/74 150/83  Pulse: 84 85 85 88  Temp:      TempSrc:      Resp:      Weight:      SpO2:        Wt Readings from Last 3 Encounters:  11/07/15 102 kg (224 lb 13.9 oz)  06/20/15 106.1 kg (233 lb 14.5 oz)  05/16/14 108.863 kg (240 lb)     Intake/Output Summary (Last 24 hours) at 11/07/15 1127 Last data filed at 11/07/15 1034  Gross per 24 hour  Intake    770 ml  Output    100 ml  Net  670 ml     Physical Exam  Awake Alert, Oriented X 3, No new F.N deficits, Normal affect [place].AT,PERRAL Supple Neck,No JVD, No cervical lymphadenopathy appriciated.  Symmetrical Chest wall movement, Good air movement bilaterally, CTAB RRR,No Gallops,Rubs or new Murmurs, No Parasternal Heave +ve B.Sounds, Abd Soft but +ve Mild RUQ tendernes, Right upper quadrant JP drain in place, No organomegaly appriciated, No rebound - guarding or  rigidity. No Cyanosis, Clubbing or edema, No new Rash or bruise       Data Review:    CBC  Recent Labs Lab 11/04/15 0837 11/05/15 0457 11/07/15 0843  WBC 19.4* 20.1* 16.8*  HGB 9.5* 9.6* 8.0*  HCT 29.5* 29.0* 24.7*  PLT 405* 468* 474*  MCV 79.7 79.9 76.2*  MCH 25.7* 26.4 24.7*  MCHC 32.2 33.1 32.4  RDW 17.5* 17.6* 17.7*  LYMPHSABS 0.6*  --   --   MONOABS 0.7  --   --   EOSABS 0.0  --   --   BASOSABS 0.0  --   --     Chemistries   Recent Labs Lab 11/04/15 0837 11/05/15 0457 11/07/15 0843  NA 135 134* 134*  K 3.9 4.2 4.1  CL 94* 90* 90*  CO2 21* 21* 16*  GLUCOSE 127* 113* 88  BUN 66* 86* 155*  CREATININE 9.01* 10.95* 15.32*  CALCIUM 7.7* 7.4* 6.5*  MG 2.2  --   --   AST 36  --  52*  ALT 24  --  36  ALKPHOS 158*  --  235*  BILITOT 1.2  --  1.1   ------------------------------------------------------------------------------------------------------------------ No results for input(s): CHOL, HDL, LDLCALC, TRIG, CHOLHDL, LDLDIRECT in the last 72 hours.  Lab Results  Component Value Date   HGBA1C 5.4 06/20/2015   ------------------------------------------------------------------------------------------------------------------ No results for input(s): TSH, T4TOTAL, T3FREE, THYROIDAB in the last 72 hours.  Invalid input(s): FREET3 ------------------------------------------------------------------------------------------------------------------ No results for input(s): VITAMINB12, FOLATE, FERRITIN, TIBC, IRON, RETICCTPCT in the last 72 hours.  Coagulation profile  Recent Labs Lab 11/04/15 0837  INR 1.36    No results for input(s): DDIMER in the last 72 hours.  Cardiac Enzymes No results for input(s): CKMB, TROPONINI, MYOGLOBIN in the last 168 hours.  Invalid input(s): CK ------------------------------------------------------------------------------------------------------------------    Component Value Date/Time   BNP 192.0* 11/04/2015 0837     Inpatient Medications  Scheduled Meds: . amLODipine  10 mg Oral Daily  . aspirin EC  81 mg Oral Daily  . buPROPion  150 mg Oral BID  . calcitRIOL  0.5 mcg Oral Daily  . cinacalcet  60 mg Oral Q breakfast  . [START ON 11/08/2015] darbepoetin (ARANESP) injection - DIALYSIS  200 mcg Intravenous Q Wed-HD  . escitalopram  20 mg Oral Daily  . feeding supplement (NEPRO CARB STEADY)  237 mL Oral BID BM  . ferric gluconate (FERRLECIT/NULECIT) IV  125 mg Intravenous Q M,W,F-HD  . heparin  5,000 Units Subcutaneous BID  . hydrALAZINE  100 mg Oral BID  . labetalol  150 mg Oral BID  . pantoprazole  40 mg Oral Daily  . piperacillin-tazobactam (ZOSYN)  IV  2.25 g Intravenous Q8H  . pravastatin  20 mg Oral q1800  . sodium chloride flush  3 mL Intravenous Q12H  . vancomycin      . vancomycin  1,000 mg Intravenous Once   Continuous Infusions: . sodium chloride 10 mL/hr at 11/06/15 1405   PRN Meds:.ALPRAZolam, fentaNYL (SUBLIMAZE) injection, gi cocktail, hydrALAZINE, metoprolol, ondansetron **OR** ondansetron (ZOFRAN) IV, oxyCODONE  Micro Results Recent Results (from the past 240 hour(s))  Culture, blood (routine x 2) Call MD if unable to obtain prior to antibiotics being given     Status: None (Preliminary result)   Collection Time: 11/04/15  8:50 AM  Result Value Ref Range Status   Specimen Description BLOOD RIGHT HAND  Final   Special Requests IN PEDIATRIC BOTTLE [place]  Final   Culture NO GROWTH 2 DAYS  Final   Report Status PENDING  Incomplete  Culture, blood (routine x 2) Call MD if unable to obtain prior to antibiotics being given     Status: None (Preliminary result)   Collection Time: 11/04/15  9:00 AM  Result Value Ref Range Status   Specimen Description BLOOD RIGHT HAND  Final   Special Requests IN PEDIATRIC BOTTLE [place]  Final   Culture NO GROWTH 2 DAYS  Final   Report Status PENDING  Incomplete    Radiology Reports Ct Abdomen Pelvis Wo Contrast  11/06/2015  CLINICAL DATA:   Right lower quadrant pain.  Fever EXAM: CT ABDOMEN AND PELVIS WITHOUT CONTRAST TECHNIQUE: Multidetector CT imaging of the abdomen and pelvis was performed following the standard protocol without IV contrast. COMPARISON:  CT 05/16/2014 FINDINGS: Lower chest: Right lower lobe infiltrate with air bronchograms. Probable pneumonia. Negative for pleural effusion. Left lung base clear. Hepatobiliary: Unenhanced images of the liver are negative. Markedly distended gallbladder with increased density in the gallbladder lumen suggesting sludge or hemorrhage or cholecystitis. There is mild edema surrounding the gallbladder neck consistent with acute inflammation. Bile ducts nondilated. Pancreas: Negative Spleen: Negative Adrenals/Urinary Tract: Negative for renal obstruction. 3 mm nonobstructing stone right lower pole. Fatty lesion left adrenal gland is unchanged may represent lipoma or myelo lipoma. Urinary bladder normal. Stomach/Bowel: Oral contrast is present in the stomach and small bowel extending into the colon and rectum. Small bowel mildly distended with air-fluid levels. This may represent ileus or low-grade obstruction. Normal appendix. There is prominent thickening of the duodenum in the second and third portions. There is stranding in the adjacent fat around the duodenum. This could represent inflammatory changes from peptic ulcer disease or duodenitis. Carcinoma cannot be excluded. Duodenal thickening is near the distended gallbladder however these may be two separate processes. Vascular/Lymphatic: Atherosclerotic calcification in the aorta and iliac arteries. No aneurysm. No lymphadenopathy. Reproductive: Negative prostate.  No bladder wall thickening. Other: No free-fluid Musculoskeletal: Disc degeneration and spurring L4-5 and L5-S1. No fracture or bony mass lesion. IMPRESSION: Markedly distended gallbladder with diffusely increased density in the gallbladder. This could represent acute cholecystitis versus  poor functioning of the gallbladder filled with sludge. Thickening of the second and third portions of the duodenum with surrounding edema in the adjacent fat. This may represent peptic ulcer disease or duodenitis. Carcinoma not excluded. Small bowel is mildly distended with air-fluid levels suggesting ileus or partial small obstruction. Oral contrast extends into the rectum Right lower lobe infiltrate, probable pneumonia. Normal appendix Electronically Signed   By: Franchot Gallo M.D.   On: 11/06/2015 08:12   Dg Chest 2 View  11/04/2015  CLINICAL DATA:  Pneumonia. EXAM: CHEST  2 VIEW COMPARISON:  06/20/2015 FINDINGS: Focal airspace disease in the right lower lobe which is new from prior. No cavitation or effusion. Generous heart size without definitive cardiomegaly. Stable aortic and hilar contours. IMPRESSION: Right lower lobe pneumonia. Followup PA and lateral chest X-ray is recommended in 3-4 weeks following trial of antibiotic therapy to ensure resolution. Electronically Signed   By: Angelica Chessman  Watts M.D.   On: 11/04/2015 10:14   Dg Abd 1 View  11/04/2015  CLINICAL DATA:  Two day history of abdominal pain EXAM: ABDOMEN - 1 VIEW COMPARISON:  CT abdomen and pelvis May 16, 2014 FINDINGS: There is no appreciable bowel dilatation or air-fluid level suggesting obstruction. No free air is seen on this supine examination. There are vascular calcifications in the pelvis. IMPRESSION: No demonstrable bowel obstruction or free air. Electronically Signed   By: Lowella Grip III M.D.   On: 11/04/2015 10:13   US Abdomen Limited Ruq  11/06/2015  CLINICAL DATA:  Right upper quadrant pain for 4 days EXAM: US ABDOMEN LIMITED - RIGHT UPPER QUADRANT COMPARISON:  CT abdomen and pelvis Nov 05, 2015 FINDINGS: Gallbladder: Gallbladder appears distended with wall thickening and wall edema. There is sludge in the gallbladder with small intermingled gallstones. The patient is focally tender over the gallbladder. There is  equivocal pericholecystic fluid. Common bile duct: Diameter: 4 mm. There is no intrahepatic or extrahepatic biliary duct dilatation. Liver: No focal lesion identified. Within normal limits in parenchymal echogenicity. IMPRESSION: Findings as described above consistent with a degree of acute cholecystitis. These results will be called to the ordering clinician or representative by the Radiologist Assistant, and communication documented in the PACS or zVision Dashboard. Electronically Signed   By: Lowella Grip III M.D.   On: 11/06/2015 10:10    Time Spent in minutes  30   Brandelyn Henne K M.D on 11/07/2015 at 11:27 AM  Between 7am to 7pm - Pager - [phone]  After 7pm go to [email or web address] - password [facility]  [name] -  Office  [phone]

## 2015-11-07 NOTE — Progress Notes (Signed)
Patient has Clonidine patch applied to right arm from home. On call NP notified. NP advised for RN to remove Clonidine patch. Care order implemented.  Ermalinda Memos, RN

## 2015-11-07 NOTE — Progress Notes (Signed)
Patient ID: Hayden [name], male   DOB: 1971/03/04, 45 y.o.   MRN: [id number]     [facility]      [address] [place], [place] [address]    Phone: [phone] FAX: [phone]     Subjective: No n/v. Feels great. No pain. Drain 48m serosanguinous output.   Objective:  Vital signs:  Filed Vitals:   11/07/15 0830 11/07/15 0900 11/07/15 0930 11/07/15 1000  BP: 138/72 134/70 130/70 130/74  Pulse: 83 84 85 85  Temp:      TempSrc:      Resp:      Weight:      SpO2:        Last BM Date: 11/04/15  Intake/Output   Yesterday:  05/08 0701 - 05/09 0700 In: 770 [P.O.:120; I.V.:350; IV Piggyback:300] Out: 100 [Drains:50; Blood:50] This shift: I/O last 3 completed shifts: In: 9196[P.O.:120; I.V.:350; IV Piggyback:450] Out: 100 [Drains:50; Blood:50]   Physical Exam: General: Pt awake/alert/oriented x4 in no acute distress  Abdomen: Soft.  Nondistended.   Mildly tender at incisions only.  Incisions are c/d/i.  jp drain with serosanguinous outupt. No evidence of peritonitis.  No incarcerated hernias.    Problem List:   Principal Problem:   Pneumonia involving right lung Active Problems:   [facility]   History of partial Right nephrectomy for renal mass (2008)   End stage renal disease on dialysis ([facility]   L[place]ar infarct, [place] ([facility]   HTN (hypertension)   Gastroenteritis   Right lower quadrant pain    Results:   Labs: Results for orders placed or performed during the hospital encounter of 11/04/15 (from the past 48 hour(s))  Glucose, capillary     Status: None   Collection Time: 11/06/15  2:25 PM  Result Value Ref Range   Glucose-Capillary 91 65 - 99 mg/dL  Glucose, capillary     Status: Abnormal   Collection Time: 11/06/15  5:25 PM  Result Value Ref Range   Glucose-Capillary 116 (H) 65 - 99 mg/dL  CBC     Status: Abnormal   Collection Time: 11/07/15  8:43 AM  Result Value Ref Range   WBC 16.8  (H) 4.0 - 10.5 K/uL   RBC 3.24 (L) 4.22 - 5.81 MIL/uL   Hemoglobin 8.0 (L) 13.0 - 17.0 g/dL   HCT 24.7 (L) 39.0 - 52.0 %   MCV 76.2 (L) 78.0 - 100.0 fL   MCH 24.7 (L) 26.0 - 34.0 pg   MCHC 32.4 30.0 - 36.0 g/dL   RDW 17.7 (H) 11.5 - 15.5 %   Platelets 474 (H) 150 - 400 K/uL  Comprehensive metabolic panel     Status: Abnormal   Collection Time: 11/07/15  8:43 AM  Result Value Ref Range   Sodium 134 (L) 135 - 145 mmol/L   Potassium 4.1 3.5 - 5.1 mmol/L   Chloride 90 (L) 101 - 111 mmol/L   CO2 16 (L) 22 - 32 mmol/L   Glucose, Bld 88 65 - 99 mg/dL   BUN 155 (H) 6 - 20 mg/dL   Creatinine, Ser 15.32 (H) 0.61 - 1.24 mg/dL   Calcium 6.5 (L) 8.9 - 10.3 mg/dL   Total Protein 6.1 (L) 6.5 - 8.1 g/dL   Albumin 1.9 (L) 3.5 - 5.0 g/dL   AST 52 (H) 15 - 41 U/L   ALT 36 17 - 63 U/L   Alkaline Phosphatase 235 (H) 38 - 126 U/L  Total Bilirubin 1.1 0.3 - 1.2 mg/dL   GFR calc non Af Amer 3 (L) >60 mL/min   GFR calc Af Amer 4 (L) >60 mL/min    Comment: (NOTE) The eGFR has been calculated using the CKD EPI equation. This calculation has not been validated in all clinical situations. eGFR's persistently <60 mL/min signify possible Chronic Kidney Disease.    Anion gap 28 (H) 5 - 15    Imaging / Studies: Ct Abdomen Pelvis Wo Contrast  11/06/2015  CLINICAL DATA:  Right lower quadrant pain.  Fever EXAM: CT ABDOMEN AND PELVIS WITHOUT CONTRAST TECHNIQUE: Multidetector CT imaging of the abdomen and pelvis was performed following the standard protocol without IV contrast. COMPARISON:  CT 05/16/2014 FINDINGS: Lower chest: Right lower lobe infiltrate with air bronchograms. Probable pneumonia. Negative for pleural effusion. Left lung base clear. Hepatobiliary: Unenhanced images of the liver are negative. Markedly distended gallbladder with increased density in the gallbladder lumen suggesting sludge or hemorrhage or cholecystitis. There is mild edema surrounding the gallbladder neck consistent with acute  inflammation. Bile ducts nondilated. Pancreas: Negative Spleen: Negative Adrenals/Urinary Tract: Negative for renal obstruction. 3 mm nonobstructing stone right lower pole. Fatty lesion left adrenal gland is unchanged may represent lipoma or myelo lipoma. Urinary bladder normal. Stomach/Bowel: Oral contrast is present in the stomach and small bowel extending into the colon and rectum. Small bowel mildly distended with air-fluid levels. This may represent ileus or low-grade obstruction. Normal appendix. There is prominent thickening of the duodenum in the second and third portions. There is stranding in the adjacent fat around the duodenum. This could represent inflammatory changes from peptic ulcer disease or duodenitis. Carcinoma cannot be excluded. Duodenal thickening is near the distended gallbladder however these may be two separate processes. Vascular/Lymphatic: Atherosclerotic calcification in the aorta and iliac arteries. No aneurysm. No lymphadenopathy. Reproductive: Negative prostate.  No bladder wall thickening. Other: No free-fluid Musculoskeletal: Disc degeneration and spurring L4-5 and L5-S1. No fracture or bony mass lesion. IMPRESSION: Markedly distended gallbladder with diffusely increased density in the gallbladder. This could represent acute cholecystitis versus poor functioning of the gallbladder filled with sludge. Thickening of the second and third portions of the duodenum with surrounding edema in the adjacent fat. This may represent peptic ulcer disease or duodenitis. Carcinoma not excluded. Small bowel is mildly distended with air-fluid levels suggesting ileus or partial small obstruction. Oral contrast extends into the rectum Right lower lobe infiltrate, probable pneumonia. Normal appendix Electronically Signed   By: Franchot Gallo M.D.   On: 11/06/2015 08:12   US Abdomen Limited Ruq  11/06/2015  CLINICAL DATA:  Right upper quadrant pain for 4 days EXAM: US ABDOMEN LIMITED - RIGHT UPPER  QUADRANT COMPARISON:  CT abdomen and pelvis Nov 05, 2015 FINDINGS: Gallbladder: Gallbladder appears distended with wall thickening and wall edema. There is sludge in the gallbladder with small intermingled gallstones. The patient is focally tender over the gallbladder. There is equivocal pericholecystic fluid. Common bile duct: Diameter: 4 mm. There is no intrahepatic or extrahepatic biliary duct dilatation. Liver: No focal lesion identified. Within normal limits in parenchymal echogenicity. IMPRESSION: Findings as described above consistent with a degree of acute cholecystitis. These results will be called to the ordering clinician or representative by the Radiologist Assistant, and communication documented in the PACS or zVision Dashboard. Electronically Signed   By: Lowella Grip III M.D.   On: 11/06/2015 10:10    Medications / Allergies:  Scheduled Meds: . amLODipine  10 mg Oral Daily  .  aspirin EC  81 mg Oral Daily  . azithromycin  500 mg Intravenous Q24H  . buPROPion  150 mg Oral BID  . calcitRIOL  0.5 mcg Oral Daily  . cinacalcet  60 mg Oral Q breakfast  . [START ON 11/08/2015] darbepoetin (ARANESP) injection - DIALYSIS  200 mcg Intravenous Q Wed-HD  . escitalopram  20 mg Oral Daily  . feeding supplement (NEPRO CARB STEADY)  237 mL Oral BID BM  . ferric gluconate (FERRLECIT/NULECIT) IV  125 mg Intravenous Q M,W,F-HD  . heparin  5,000 Units Subcutaneous BID  . hydrALAZINE  100 mg Oral BID  . labetalol  150 mg Oral BID  . pantoprazole  40 mg Oral Daily  . piperacillin-tazobactam (ZOSYN)  IV  2.25 g Intravenous Q8H  . pravastatin  20 mg Oral q1800  . sodium chloride flush  3 mL Intravenous Q12H   Continuous Infusions: . sodium chloride 10 mL/hr at 11/06/15 1405   PRN Meds:.ALPRAZolam, fentaNYL (SUBLIMAZE) injection, gi cocktail, hydrALAZINE, metoprolol, ondansetron **OR** ondansetron (ZOFRAN) IV, oxyCODONE  Antibiotics: Anti-infectives    Start     Dose/Rate Route Frequency  Ordered Stop   11/06/15 1800  vancomycin (VANCOCIN) IVPB 1000 mg/200 mL premix  Status:  Discontinued     1,000 mg 200 mL/hr over 60 Minutes Intravenous Every [name]-F (Hemodialysis) 11/06/15 0825 11/06/15 1238   11/05/15 1400  azithromycin (ZITHROMAX) 500 mg in dextrose 5 % 250 mL IVPB     500 mg 250 mL/hr over 60 Minutes Intravenous Every 24 hours 11/05/15 1153     11/04/15 1000  piperacillin-tazobactam (ZOSYN) IVPB 2.25 g     2.25 g 100 mL/hr over 30 Minutes Intravenous Every 8 hours 11/04/15 0902     11/04/15 0900  cefTRIAXone (ROCEPHIN) 1 g in dextrose 5 % 50 mL IVPB  Status:  Discontinued    Comments:  From time of last dose outside hospital   1 g 100 mL/hr over 30 Minutes Intravenous Every 24 hours 11/04/15 0811 11/04/15 0844   11/04/15 0900  azithromycin (ZITHROMAX) 500 mg in dextrose 5 % 250 mL IVPB  Status:  Discontinued    Comments:  From time of last dose outside hospital   500 mg 250 mL/hr over 60 Minutes Intravenous Every 24 hours 11/04/15 0811 11/04/15 0844   11/04/15 0845  piperacillin-tazobactam (ZOSYN) IVPB 3.375 g  Status:  Discontinued    Comments:  From time received ouside er/ 11/04/15  At 315am   3.375 g 100 mL/hr over 30 Minutes Intravenous Every 8 hours 11/04/15 0844 11/04/15 0902        Assessment/Plan Acute gangrenous cholecystitis POD#1 laparoscopic cholecystectomy--Dr. Rosendo Gros  -advance diet, mobilize, continue drain(serosanguinous) and monitor for a bile leak [email or web address] for cholecystitis, PNA tx per primary team Dispo-suspect he'll be ready for DC surgical 24-48h  Erby Pian, [facility] Pager [phone](7A-4:30P) For consults and floor pages call [phone](7A-4:30P)  11/07/2015 10:25 AM

## 2015-11-07 NOTE — Progress Notes (Signed)
PT Cancellation Note  Patient Details Name: Hayden [name] MRN: [id number] DOB: Nov 20, 1970   Cancelled Treatment:    Reason Eval/Treat Not Completed: Patient at procedure or test/unavailable   Currently in HD;  Will follow up later today as time allows;  Otherwise, will follow up for PT tomorrow;   Thank you,  Roney Marion, [place] Pager [phone] Office [phone]     Roney Marion [facility] 11/07/2015, 9:19 AM

## 2015-11-07 NOTE — Procedures (Signed)
I have seen and examined this patient and agree with the plan of care. Seen on dialysis with acute cholecystitis and cholecystectomy performed 5/8 .    Ca 7.4  Phos 7.4  Alb 2.5  Hb 8  Give Aranesp  166mcg and  IV iron  Wednesday  IV zosyn and IV azithromycin     Kayah Hecker W 11/07/2015, 9:14 AM

## 2015-11-07 NOTE — Anesthesia Postprocedure Evaluation (Signed)
Anesthesia Post Note  Patient: Hayden [name]  Procedure(s) Performed: Procedure(s): LAPAROSCOPIC CHOLECYSTECTOMY  Patient location during evaluation: PACU Anesthesia Type: General Level of consciousness: awake and alert Pain management: pain level controlled Vital Signs Assessment: post-procedure vital signs reviewed and stable Respiratory status: spontaneous breathing, nonlabored ventilation and respiratory function stable Cardiovascular status: blood pressure returned to baseline and stable Postop Assessment: no signs of nausea or vomiting Anesthetic complications: no    Last Vitals:  Filed Vitals:   11/06/15 2052 11/07/15 0551  BP: 134/57 125/59  Pulse: 87 86  Temp: 36.5 C 36.7 C  Resp: 17 18    Last Pain:  Filed Vitals:   11/07/15 0552  PainSc: Asleep                 Tereso Unangst,W. EDMOND

## 2015-11-07 NOTE — Progress Notes (Signed)
Physical Therapy Treatment Patient Details Name: Hayden [name] MRN: [id number] DOB: 12-26-1970 Today's Date: 11/07/2015    History of Present Illness Patient is a 45 yo male admitted 11/04/15 with abdominal pain and recent viral illness.  Patient with RLL pna.   Now s/p lap cholecystectomy on 11/06/15  PMH:  ESRD on HD M, W, F, hypertension, CVA (lacunar infarct) in 2016    PT Comments    Reports much less abdominal discomfort today, and very much wants to walk and do more;   activity tolerance today, however, quite limited by dizziness in standing; it lessened somewhat in sitting, but was still present; RN notified;   Serial BPs were as follows:     11/07/15 1610 11/07/15 1615 11/07/15 1618  Vital Signs  Pulse Rate --  93 88  BP (!) 124/59 mmHg (Pt states this is low for him) (!) 104/50 mmHg (Very dizzy standing, so had to sit while BP was being taken) (!) 151/60 mmHg  Patient Position (if appropriate) Sitting Standing Lying     Follow Up Recommendations  No PT follow up;Supervision for mobility/OOB     Equipment Recommendations  None recommended by PT    Recommendations for Other Services       Precautions / Restrictions Precautions Precautions: None;Other (comment) Precaution Comments: Orthostatic on 5/9    Mobility  Bed Mobility Overal bed mobility: Needs Assistance Bed Mobility: Sit to Supine       Sit to supine: Min guard   General bed mobility comments: able to lift LEs onto bed without assist  Transfers Overall transfer level: Needs assistance Equipment used: None Transfers: Sit to/from Stand Sit to Stand: Supervision         General transfer comment: Supervision for safety.  Slightly unsteady in stance.  Ambulation/Gait Ambulation/Gait assistance: Min guard Ambulation Distance (Feet): 10 Feet Assistive device: None Gait Pattern/deviations: Step-through pattern     General Gait Details: Walked a bit inthe room, and Hayden [name] became less  steady, and reproted dizziness; Sat down to bed and took BP   Stairs            Wheelchair Mobility    Modified Rankin (Stroke Patients Only)       Balance                                    Cognition Arousal/Alertness: Awake/alert Behavior During Therapy: WFL for tasks assessed/performed Overall Cognitive Status: Within Functional Limits for tasks assessed                      Exercises      General Comments        Pertinent Vitals/Pain Pain Assessment: No/denies pain    Home Living                      Prior Function            PT Goals (current goals can now be found in the care plan section) Acute Rehab PT Goals Patient Stated Goal: To get stronger PT Goal Formulation: With patient Time For Goal Achievement: 11/12/15 Potential to Achieve Goals: Good Progress towards PT goals: Not progressing toward goals - comment (limited by orhtostatic hypotension)    Frequency  Min 3X/week    PT Plan Current plan remains appropriate    Co-evaluation  End of Session   Activity Tolerance: Other (comment) (limited by orthostatic hypotension) Patient left: in bed;with call bell/phone within reach;with family/visitor present     Time: [id number] PT Time Calculation (min) (ACUTE ONLY): 20 min  Charges:  $Therapeutic Activity: 8-22 mins                    G Codes:      Quin Hoop 11/07/2015, 4:33 PM  Roney Marion, [place] Pager [phone] Office [phone]

## 2015-11-08 DIAGNOSIS — Z992 Dependence on renal dialysis: Secondary | ICD-10-CM

## 2015-11-08 DIAGNOSIS — D649 Anemia, unspecified: Secondary | ICD-10-CM

## 2015-11-08 DIAGNOSIS — N186 End stage renal disease: Secondary | ICD-10-CM

## 2015-11-08 LAB — COMPREHENSIVE METABOLIC PANEL
ALT: 42 U/L (ref 17–63)
ANION GAP: 19 — AB (ref 5–15)
AST: 55 U/L — ABNORMAL HIGH (ref 15–41)
Albumin: 1.9 g/dL — ABNORMAL LOW (ref 3.5–5.0)
Alkaline Phosphatase: 254 U/L — ABNORMAL HIGH (ref 38–126)
BUN: 72 mg/dL — ABNORMAL HIGH (ref 6–20)
CALCIUM: 6.8 mg/dL — AB (ref 8.9–10.3)
CO2: 24 mmol/L (ref 22–32)
Chloride: 94 mmol/L — ABNORMAL LOW (ref 101–111)
Creatinine, Ser: 8.72 mg/dL — ABNORMAL HIGH (ref 0.61–1.24)
GFR calc non Af Amer: 7 mL/min — ABNORMAL LOW (ref >60)
GFR, EST AFRICAN AMERICAN: 8 mL/min — AB (ref >60)
Glucose, Bld: 96 mg/dL (ref 65–99)
Potassium: 3.6 mmol/L (ref 3.5–5.1)
SODIUM: 137 mmol/L (ref 135–145)
Total Bilirubin: 1.1 mg/dL (ref 0.3–1.2)
Total Protein: 5.4 g/dL — ABNORMAL LOW (ref 6.5–8.1)

## 2015-11-08 LAB — CBC
HCT: 22.9 % — ABNORMAL LOW (ref 39.0–52.0)
HEMOGLOBIN: 7.5 g/dL — AB (ref 13.0–17.0)
MCH: 25.6 pg — AB (ref 26.0–34.0)
MCHC: 32.8 g/dL (ref 30.0–36.0)
MCV: 78.2 fL (ref 78.0–100.0)
PLATELETS: 444 10*3/uL — AB (ref 150–400)
RBC: 2.93 MIL/uL — AB (ref 4.22–5.81)
RDW: 17.8 % — AB (ref 11.5–15.5)
WBC: 12.7 10*3/uL — AB (ref 4.0–10.5)

## 2015-11-08 LAB — PREPARE RBC (CROSSMATCH)

## 2015-11-08 LAB — ABO/RH: ABO/RH(D): A NEG

## 2015-11-08 MED ORDER — DARBEPOETIN ALFA 200 MCG/0.4ML IJ SOSY
200.0000 ug | PREFILLED_SYRINGE | INTRAMUSCULAR | Status: DC
  Administered 2015-11-08: 200 ug via SUBCUTANEOUS
  Filled 2015-11-08: qty 0.4

## 2015-11-08 MED ORDER — SODIUM CHLORIDE 0.9 % IV SOLN
Freq: Once | INTRAVENOUS | Status: AC
  Administered 2015-11-08: 14:00:00 via INTRAVENOUS

## 2015-11-08 NOTE — Progress Notes (Signed)
Subjective:  Feels stronger , tolerated HD yest ,had mild infiltration of AVF, tolerating solid brk sp lap chole  Objective Vital signs in last 24 hours: Filed Vitals:   11/07/15 2032 11/08/15 0427 11/08/15 0801 11/08/15 1016  BP: 134/63 119/64 148/65 120/88  Pulse: 87 91 86 88  Temp: 98.9 F (37.2 C) 98.9 F (37.2 C) 98.4 F (36.9 C) 99.4 F (37.4 C)  TempSrc:  Oral Oral Oral  Resp: 20 18 18 18   Weight: 101.8 kg (224 lb 6.9 oz)     SpO2: 90% 94% 94% 96%   Weight change:   Physical Exam: General: alert Adult WM NAD  abdominal pain  Heart: RRR no mur, rub or gal. Lungs: CTA Bilat/ nonlabored breathing Abdomen: obese, BS =nl .Nondistend. / JP Drain serosanguinous fluid/SP lap chole incisions sites c/d/i  Extremities: no pedal edema  Dialysis Access: pos bruit L FA AVF  Dialysis Orders: Center: HOME HD Mon>Fris Followed by Dr. Moshe Cipro 3 hrs 1.0 k edw 108 KG .  Heparin 1000. Access LFA AVF  meds= Mircera 200 next Due 11/15/15 Venofer 300mg  on 11/15/15  Other op labs= TFS 9% =501/17, Ferritin 712/ phos =6.9 Ca 6.7 pth 117  Problem/Plan: 1. ESRD - Normal Home HD Is Mon>fri 3hrs/ HD yest. K 3.6 this am/  Next HD tomorrow at home with noted plans for dc  2. Acute calculous cholecystitis- Status post laparoscopic cholecystectomy  FU with CCS per their instruc . Ok for dc 3. Hypertension/volume - bp stable on BP meds / wt 101.8 yest post hd 7 kg below edw sec npo and decr appetite/no excess volume on exam   4. Anemia - Of ESRDand post o  Hg 7.7 toda for 1 unit PRBCS then dc  - ESA in hosp give today  / Iron load with Low Transfer sat below 10%/ only received once yesterday on HD /to go to home training as op and receive FE. 5. Metabolic bone disease - Uses po vit d / sensipar/ no binder listed / fu phos 7.4 on admit / ca corec today 8.5  Continue binders as op / can dc sensipar for now pth 117 and ca lowish, dw pt . 6. HO Lacunar Infarcts Dec  2016- stable    Hayden Haber, PA-C [facility] [phone] 11/08/2015,11:37 AM  LOS: 4 days   Labs: Basic Metabolic Panel:  Recent Labs Lab 11/04/15 0837 11/05/15 0457 11/07/15 0843 11/08/15 0443  NA 135 134* 134* 137  K 3.9 4.2 4.1 3.6  CL 94* 90* 90* 94*  CO2 21* 21* 16* 24  GLUCOSE 127* 113* 88 96  BUN 66* 86* 155* 72*  CREATININE 9.01* 10.95* 15.32* 8.72*  CALCIUM 7.7* 7.4* 6.5* 6.8*  PHOS 7.4*  --   --   --    Liver Function Tests:  Recent Labs Lab 11/04/15 0837 11/07/15 0843 11/08/15 0443  AST 36 52* 55*  ALT 24 36 42  ALKPHOS 158* 235* 254*  BILITOT 1.2 1.1 1.1  PROT 6.2* 6.1* 5.4*  ALBUMIN 2.5* 1.9* 1.9*   No results for input(s): LIPASE, AMYLASE in the last 168 hours. No results for input(s): AMMONIA in the last 168 hours. CBC:  Recent Labs Lab 11/04/15 0837 11/05/15 0457 11/07/15 0843 11/08/15 0443  WBC 19.4* 20.1* 16.8* 12.7*  NEUTROABS 18.0*  --   --   --   HGB 9.5* 9.6* 8.0* 7.5*  HCT 29.5* 29.0* 24.7* 22.9*  MCV 79.7 79.9 76.2* 78.2  PLT 405*  468* 474* 444*   Cardiac Enzymes: No results for input(s): CKTOTAL, CKMB, CKMBINDEX, TROPONINI in the last 168 hours. CBG:  Recent Labs Lab 11/06/15 1425 11/06/15 1725  GLUCAP 91 116*    Studies/Results: No results found. Medications: . sodium chloride 10 mL/hr at 11/06/15 1405   . sodium chloride   Intravenous Once  . amLODipine  10 mg Oral Daily  . aspirin EC  81 mg Oral Daily  . buPROPion  150 mg Oral BID  . calcitRIOL  0.5 mcg Oral Daily  . cinacalcet  60 mg Oral Q breakfast  . darbepoetin (ARANESP) injection - DIALYSIS  200 mcg Intravenous Q Wed-HD  . escitalopram  20 mg Oral Daily  . ferric gluconate (FERRLECIT/NULECIT) IV  125 mg Intravenous Q M,W,F-HD  . heparin  5,000 Units Subcutaneous BID  . hydrALAZINE  100 mg Oral BID  . labetalol  150 mg Oral BID  . pantoprazole  40 mg Oral Daily  . piperacillin-tazobactam (ZOSYN)  IV  2.25 g Intravenous Q8H  .  pravastatin  20 mg Oral q1800  . sodium chloride flush  3 mL Intravenous Q12H

## 2015-11-08 NOTE — Progress Notes (Signed)
PROGRESS NOTE                                                                                                                                                                                                             Patient Demographics:    Hayden [name], is a 45 y.o. male, DOB - August 24, 1970, [id number]  Admit date - 11/04/2015   Admitting Physician Maren Reamer, MD  Outpatient Primary MD for the patient is Louis Meckel, MD  LOS - 4  Outpatient Specialists:  [place] [facility]  No chief complaint on file.      Brief Narrative   Hayden [name] is a 45 y.o. male with end-stage renal disease on home hemodialysis, Hypertension, lacunar infarcts in 06/2015, answered spell outside hospital ER [place], [place] [facility] ER) with community-acquired pneumonia. Of note, he stated he had some viral flulike illness, Monday through Wednesday with nonproductive cough, low-grade temperatures 101-102. Started feeling better until Wednesday afternoon started having severe abdominal pain with mild nausea, described as a burning sensation.   He was admitted with running diagnosis of pneumonia further workup On 11/06/2015 showed acute cholecystitis, I assumed his care on day 2 on 11/06/2015, Gen. surgery was consulted along with renal for dialysis. He underwent laparoscopic cholecystectomy on 11/06/2015.   Subjective:    Hayden [name] today has, No headache, No chest pain, Improved RUQ abdominal pain - No Nausea, No new weakness tingling or numbness, No Cough - SOB.     Assessment  & Plan :     1.Right upper quadrant abdominal Pain due to acute cholecystitis. Status post laparoscopic cholecystectomy by general surgery Dr. Lynden Ang is on 11/06/2015, his JP drain, diet per general surgery, continue IV Zosyn, stop or other antibiotics I do not think he has pneumonia which was a suspicion upon admission. Supportive care, Since her  discharge in a.m.  2. ESRD. On Monday, Wednesday and Friday schedule during hospital stay, he is usually on home hemodialysis. Renal on board.  3. Possibility of pneumonia. He really does not have any cough or shortness of breath. Continue Zosyn for #1 above. All other antibiotics.  4. Essential hypertension. Home medications continued would as needed IV Lopressor and hydralazine.  5. HX of stroke in the past. No acute issue. Supportive care. Continue aspirin and statin for secondary prevention.  6. Dyslipidemia. On  statin  8. Anemia of chronic disease along with perioperative blood loss treated anemia. Hemoglobin 7.5, will transfuse 1 unit PRBC today  Code Status : Full  Family Communication  : None  Disposition Plan  : Home in 24 hours, if hemoglobin remained stable in a.m. after transfusion  Consults  :  Surgery  Procedures  :   CT Scan abdomen and pelvis and right upper quadrant ultrasound suggestive of acute cholecystitis.  Laparoscopic cholecystectomy on 11/06/2015  DVT Prophylaxis  :   Heparin   Lab Results  Component Value Date   PLT 444* 11/08/2015    Antibiotics  :     Anti-infectives    Start     Dose/Rate Route Frequency Ordered Stop   11/08/15 1200  vancomycin (VANCOCIN) IVPB 1000 mg/200 mL premix  Status:  Discontinued     1,000 mg 200 mL/hr over 60 Minutes Intravenous Every [name]-F (Hemodialysis) 11/07/15 1120 11/07/15 1126   11/07/15 1115  vancomycin (VANCOCIN) IVPB 1000 mg/200 mL premix     1,000 mg 200 mL/hr over 60 Minutes Intravenous  Once 11/07/15 1100 11/07/15 1206   11/07/15 1105  vancomycin (VANCOCIN) 1-5 GM/200ML-% IVPB    Comments:  Cherylann Banas   : cabinet override      11/07/15 1105 11/07/15 2314   11/06/15 1800  vancomycin (VANCOCIN) IVPB 1000 mg/200 mL premix  Status:  Discontinued     1,000 mg 200 mL/hr over 60 Minutes Intravenous Every [name]-F (Hemodialysis) 11/06/15 0825 11/06/15 1238   11/05/15 1400  azithromycin (ZITHROMAX) 500 mg in  dextrose 5 % 250 mL IVPB  Status:  Discontinued     500 mg 250 mL/hr over 60 Minutes Intravenous Every 24 hours 11/05/15 1153 11/07/15 1126   11/04/15 1000  piperacillin-tazobactam (ZOSYN) IVPB 2.25 g     2.25 g 100 mL/hr over 30 Minutes Intravenous Every 8 hours 11/04/15 0902     11/04/15 0900  cefTRIAXone (ROCEPHIN) 1 g in dextrose 5 % 50 mL IVPB  Status:  Discontinued    Comments:  From time of last dose outside hospital   1 g 100 mL/hr over 30 Minutes Intravenous Every 24 hours 11/04/15 0811 11/04/15 0844   11/04/15 0900  azithromycin (ZITHROMAX) 500 mg in dextrose 5 % 250 mL IVPB  Status:  Discontinued    Comments:  From time of last dose outside hospital   500 mg 250 mL/hr over 60 Minutes Intravenous Every 24 hours 11/04/15 0811 11/04/15 0844   11/04/15 0845  piperacillin-tazobactam (ZOSYN) IVPB 3.375 g  Status:  Discontinued    Comments:  From time received ouside er/ 11/04/15  At 315am   3.375 g 100 mL/hr over 30 Minutes Intravenous Every 8 hours 11/04/15 0844 11/04/15 0902        Objective:   Filed Vitals:   11/08/15 0801 11/08/15 1016 11/08/15 1418 11/08/15 1446  BP: 148/65 120/88 138/62 133/58  Pulse: 86 88 85 84  Temp: 98.4 F (36.9 C) 99.4 F (37.4 C) 98.1 F (36.7 C) 98.4 F (36.9 C)  TempSrc: Oral Oral Oral Oral  Resp: 18 18 18 19   Weight:      SpO2: 94% 96% 93% 92%    Wt Readings from Last 3 Encounters:  11/07/15 101.8 kg (224 lb 6.9 oz)  06/20/15 106.1 kg (233 lb 14.5 oz)  05/16/14 108.863 kg (240 lb)     Intake/Output Summary (Last 24 hours) at 11/08/15 1452 Last data filed at 11/08/15 1320  Gross per 24 hour  Intake    530 ml  Output      0 ml  Net    530 ml     Physical Exam  Awake Alert, Oriented X 3, No new F.N deficits, Normal affect [place].AT,PERRAL Supple Neck,No JVD, No cervical lymphadenopathy appriciated.  Symmetrical Chest wall movement, Good air movement bilaterally, CTAB RRR,No Gallops,Rubs or new Murmurs, No Parasternal Heave +ve  B.Sounds, Abd Soft but +ve Mild RUQ tendernes, Right upper quadrant JP drain in place, No organomegaly appriciated, No rebound - guarding or rigidity. No Cyanosis, Clubbing or edema, No new Rash or bruise       Data Review:    CBC  Recent Labs Lab 11/04/15 0837 11/05/15 0457 11/07/15 0843 11/08/15 0443  WBC 19.4* 20.1* 16.8* 12.7*  HGB 9.5* 9.6* 8.0* 7.5*  HCT 29.5* 29.0* 24.7* 22.9*  PLT 405* 468* 474* 444*  MCV 79.7 79.9 76.2* 78.2  MCH 25.7* 26.4 24.7* 25.6*  MCHC 32.2 33.1 32.4 32.8  RDW 17.5* 17.6* 17.7* 17.8*  LYMPHSABS 0.6*  --   --   --   MONOABS 0.7  --   --   --   EOSABS 0.0  --   --   --   BASOSABS 0.0  --   --   --     Chemistries   Recent Labs Lab 11/04/15 0837 11/05/15 0457 11/07/15 0843 11/08/15 0443  NA 135 134* 134* 137  K 3.9 4.2 4.1 3.6  CL 94* 90* 90* 94*  CO2 21* 21* 16* 24  GLUCOSE 127* 113* 88 96  BUN 66* 86* 155* 72*  CREATININE 9.01* 10.95* 15.32* 8.72*  CALCIUM 7.7* 7.4* 6.5* 6.8*  MG 2.2  --   --   --   AST 36  --  52* 55*  ALT 24  --  36 42  ALKPHOS 158*  --  235* 254*  BILITOT 1.2  --  1.1 1.1   ------------------------------------------------------------------------------------------------------------------ No results for input(s): CHOL, HDL, LDLCALC, TRIG, CHOLHDL, LDLDIRECT in the last 72 hours.  Lab Results  Component Value Date   HGBA1C 5.4 06/20/2015   ------------------------------------------------------------------------------------------------------------------ No results for input(s): TSH, T4TOTAL, T3FREE, THYROIDAB in the last 72 hours.  Invalid input(s): FREET3 ------------------------------------------------------------------------------------------------------------------ No results for input(s): VITAMINB12, FOLATE, FERRITIN, TIBC, IRON, RETICCTPCT in the last 72 hours.  Coagulation profile  Recent Labs Lab 11/04/15 0837  INR 1.36    No results for input(s): DDIMER in the last 72 hours.  Cardiac  Enzymes No results for input(s): CKMB, TROPONINI, MYOGLOBIN in the last 168 hours.  Invalid input(s): CK ------------------------------------------------------------------------------------------------------------------    Component Value Date/Time   BNP 192.0* 11/04/2015 0837    Inpatient Medications  Scheduled Meds: . amLODipine  10 mg Oral Daily  . aspirin EC  81 mg Oral Daily  . buPROPion  150 mg Oral BID  . calcitRIOL  0.5 mcg Oral Daily  . darbepoetin (ARANESP) injection - DIALYSIS  200 mcg Subcutaneous Q Wed-HD  . escitalopram  20 mg Oral Daily  . ferric gluconate (FERRLECIT/NULECIT) IV  125 mg Intravenous Q M,W,F-HD  . heparin  5,000 Units Subcutaneous BID  . hydrALAZINE  100 mg Oral BID  . labetalol  150 mg Oral BID  . pantoprazole  40 mg Oral Daily  . piperacillin-tazobactam (ZOSYN)  IV  2.25 g Intravenous Q8H  . pravastatin  20 mg Oral q1800  . sodium chloride flush  3 mL Intravenous Q12H   Continuous Infusions: . sodium chloride 10 mL/hr at 11/06/15  1405   PRN Meds:.ALPRAZolam, fentaNYL (SUBLIMAZE) injection, gi cocktail, hydrALAZINE, metoprolol, ondansetron **OR** ondansetron (ZOFRAN) IV, oxyCODONE  Micro Results Recent Results (from the past 240 hour(s))  Culture, blood (routine x 2) Call MD if unable to obtain prior to antibiotics being given     Status: None (Preliminary result)   Collection Time: 11/04/15  8:50 AM  Result Value Ref Range Status   Specimen Description BLOOD RIGHT HAND  Final   Special Requests IN PEDIATRIC BOTTLE 1CC  Final   Culture NO GROWTH 4 DAYS  Final   Report Status PENDING  Incomplete  Culture, blood (routine x 2) Call MD if unable to obtain prior to antibiotics being given     Status: None (Preliminary result)   Collection Time: 11/04/15  9:00 AM  Result Value Ref Range Status   Specimen Description BLOOD RIGHT HAND  Final   Special Requests IN PEDIATRIC BOTTLE 1CC  Final   Culture NO GROWTH 4 DAYS  Final   Report Status  PENDING  Incomplete    Radiology Reports Ct Abdomen Pelvis Wo Contrast  11/06/2015  CLINICAL DATA:  Right lower quadrant pain.  Fever EXAM: CT ABDOMEN AND PELVIS WITHOUT CONTRAST TECHNIQUE: Multidetector CT imaging of the abdomen and pelvis was performed following the standard protocol without IV contrast. COMPARISON:  CT 05/16/2014 FINDINGS: Lower chest: Right lower lobe infiltrate with air bronchograms. Probable pneumonia. Negative for pleural effusion. Left lung base clear. Hepatobiliary: Unenhanced images of the liver are negative. Markedly distended gallbladder with increased density in the gallbladder lumen suggesting sludge or hemorrhage or cholecystitis. There is mild edema surrounding the gallbladder neck consistent with acute inflammation. Bile ducts nondilated. Pancreas: Negative Spleen: Negative Adrenals/Urinary Tract: Negative for renal obstruction. 3 mm nonobstructing stone right lower pole. Fatty lesion left adrenal gland is unchanged may represent lipoma or myelo lipoma. Urinary bladder normal. Stomach/Bowel: Oral contrast is present in the stomach and small bowel extending into the colon and rectum. Small bowel mildly distended with air-fluid levels. This may represent ileus or low-grade obstruction. Normal appendix. There is prominent thickening of the duodenum in the second and third portions. There is stranding in the adjacent fat around the duodenum. This could represent inflammatory changes from peptic ulcer disease or duodenitis. Carcinoma cannot be excluded. Duodenal thickening is near the distended gallbladder however these may be two separate processes. Vascular/Lymphatic: Atherosclerotic calcification in the aorta and iliac arteries. No aneurysm. No lymphadenopathy. Reproductive: Negative prostate.  No bladder wall thickening. Other: No free-fluid Musculoskeletal: Disc degeneration and spurring L4-5 and L5-S1. No fracture or bony mass lesion. IMPRESSION: Markedly distended gallbladder  with diffusely increased density in the gallbladder. This could represent acute cholecystitis versus poor functioning of the gallbladder filled with sludge. Thickening of the second and third portions of the duodenum with surrounding edema in the adjacent fat. This may represent peptic ulcer disease or duodenitis. Carcinoma not excluded. Small bowel is mildly distended with air-fluid levels suggesting ileus or partial small obstruction. Oral contrast extends into the rectum Right lower lobe infiltrate, probable pneumonia. Normal appendix Electronically Signed   By: Franchot Gallo M.D.   On: 11/06/2015 08:12   Dg Chest 2 View  11/04/2015  CLINICAL DATA:  Pneumonia. EXAM: CHEST  2 VIEW COMPARISON:  06/20/2015 FINDINGS: Focal airspace disease in the right lower lobe which is new from prior. No cavitation or effusion. Generous heart size without definitive cardiomegaly. Stable aortic and hilar contours. IMPRESSION: Right lower lobe pneumonia. Followup PA and lateral chest X-ray  is recommended in 3-4 weeks following trial of antibiotic therapy to ensure resolution. Electronically Signed   By: Monte Fantasia M.D.   On: 11/04/2015 10:14   Dg Abd 1 View  11/04/2015  CLINICAL DATA:  Two day history of abdominal pain EXAM: ABDOMEN - 1 VIEW COMPARISON:  CT abdomen and pelvis May 16, 2014 FINDINGS: There is no appreciable bowel dilatation or air-fluid level suggesting obstruction. No free air is seen on this supine examination. There are vascular calcifications in the pelvis. IMPRESSION: No demonstrable bowel obstruction or free air. Electronically Signed   By: Lowella Grip III M.D.   On: 11/04/2015 10:13   US Abdomen Limited Ruq  11/06/2015  CLINICAL DATA:  Right upper quadrant pain for 4 days EXAM: US ABDOMEN LIMITED - RIGHT UPPER QUADRANT COMPARISON:  CT abdomen and pelvis Nov 05, 2015 FINDINGS: Gallbladder: Gallbladder appears distended with wall thickening and wall edema. There is sludge in the gallbladder  with small intermingled gallstones. The patient is focally tender over the gallbladder. There is equivocal pericholecystic fluid. Common bile duct: Diameter: 4 mm. There is no intrahepatic or extrahepatic biliary duct dilatation. Liver: No focal lesion identified. Within normal limits in parenchymal echogenicity. IMPRESSION: Findings as described above consistent with a degree of acute cholecystitis. These results will be called to the ordering clinician or representative by the Radiologist Assistant, and communication documented in the PACS or zVision Dashboard. Electronically Signed   By: Lowella Grip III M.D.   On: 11/06/2015 10:10     Erving Sassano M.D on 11/08/2015 at 2:52 PM  Between 7am to 7pm - Pager - [phone]  After 7pm go to [email or web address] - password [facility]  [name] -  Office  [phone]

## 2015-11-08 NOTE — Progress Notes (Signed)
Patient ID: Hayden [name], male   DOB: [birth year], 45 y.o.   MRN: [id number]     [facility]      [address] [place], [place] [address]    Phone: [phone] FAX: [phone]     Subjective: Tolerating POs. Passing flatus. Ambulating. hgb down some. 8m serosang output from drain.  Objective:  Vital signs:  Filed Vitals:   11/07/15 2032 11/08/15 0427 11/08/15 0801 11/08/15 1016  BP: 134/63 119/64 148/65 120/88  Pulse: 87 91 86 88  Temp: 98.9 F (37.2 C) 98.9 F (37.2 C) 98.4 F (36.9 C) 99.4 F (37.4 C)  TempSrc:  Oral Oral Oral  Resp: 20 18 18 18   Weight: 101.8 kg (224 lb 6.9 oz)     SpO2: 90% 94% 94% 96%    Last BM Date: 11/08/15  Intake/Output   Yesterday:  05/09 0701 - 05/10 0700 In: 480 [P.O.:480] Out: 50 [Drains:50] This shift:  Total I/O In: 240 [P.O.:240] Out: -   Physical Exam: General: Pt awake/alert/oriented x4 in no acute distress  Abdomen: Soft. Nondistended. Mildly tender at incisions only. Incisions are c/d/i. jp drain with serosanguinous outupt. No evidence of peritonitis. No incarcerated hernias.   Problem List:   Principal Problem:   Pneumonia involving right lung Active Problems:   [place]   History of partial Right nephrectomy for renal mass (2008)   End stage renal disease on dialysis ([facility]   Lacunar infarct, acute ([facility]   HTN (hypertension)   Gastroenteritis   Right lower quadrant pain    Results:   Labs: Results for orders placed or performed during the hospital encounter of 11/04/15 (from the past 48 hour(s))  Glucose, capillary     Status: None   Collection Time: 11/06/15  2:25 PM  Result Value Ref Range   Glucose-Capillary 91 65 - 99 mg/dL  Glucose, capillary     Status: Abnormal   Collection Time: 11/06/15  5:25 PM  Result Value Ref Range   Glucose-Capillary 116 (H) 65 - 99 mg/dL  CBC     Status: Abnormal   Collection Time: 11/07/15   8:43 AM  Result Value Ref Range   WBC 16.8 (H) 4.0 - 10.5 K/uL   RBC 3.24 (L) 4.22 - 5.81 MIL/uL   Hemoglobin 8.0 (L) 13.0 - 17.0 g/dL   HCT 24.7 (L) 39.0 - 52.0 %   MCV 76.2 (L) 78.0 - 100.0 fL   MCH 24.7 (L) 26.0 - 34.0 pg   MCHC 32.4 30.0 - 36.0 g/dL   RDW 17.7 (H) 11.5 - 15.5 %   Platelets 474 (H) 150 - 400 K/uL  Comprehensive metabolic panel     Status: Abnormal   Collection Time: 11/07/15  8:43 AM  Result Value Ref Range   Sodium 134 (L) 135 - 145 mmol/L   Potassium 4.1 3.5 - 5.1 mmol/L   Chloride 90 (L) 101 - 111 mmol/L   CO2 16 (L) 22 - 32 mmol/L   Glucose, Bld 88 65 - 99 mg/dL   BUN 155 (H) 6 - 20 mg/dL   Creatinine, Ser 15.32 (H) 0.61 - 1.24 mg/dL   Calcium 6.5 (L) 8.9 - 10.3 mg/dL   Total Protein 6.1 (L) 6.5 - 8.1 g/dL   Albumin 1.9 (L) 3.5 - 5.0 g/dL   AST 52 (H) 15 - 41 U/L   ALT 36 17 - 63 U/L   Alkaline Phosphatase 235 (H) 38 - 126  U/L   Total Bilirubin 1.1 0.3 - 1.2 mg/dL   GFR calc non Af Amer 3 (L) >60 mL/min   GFR calc Af Amer 4 (L) >60 mL/min    Comment: (NOTE) The eGFR has been calculated using the CKD EPI equation. This calculation has not been validated in all clinical situations. eGFR's persistently <60 mL/min signify possible Chronic Kidney Disease.    Anion gap 28 (H) 5 - 15  Comprehensive metabolic panel     Status: Abnormal   Collection Time: 11/08/15  4:43 AM  Result Value Ref Range   Sodium 137 135 - 145 mmol/L   Potassium 3.6 3.5 - 5.1 mmol/L   Chloride 94 (L) 101 - 111 mmol/L   CO2 24 22 - 32 mmol/L   Glucose, Bld 96 65 - 99 mg/dL   BUN 72 (H) 6 - 20 mg/dL   Creatinine, Ser 8.72 (H) 0.61 - 1.24 mg/dL    Comment: DELTA CHECK NOTED   Calcium 6.8 (L) 8.9 - 10.3 mg/dL   Total Protein 5.4 (L) 6.5 - 8.1 g/dL   Albumin 1.9 (L) 3.5 - 5.0 g/dL   AST 55 (H) 15 - 41 U/L   ALT 42 17 - 63 U/L   Alkaline Phosphatase 254 (H) 38 - 126 U/L   Total Bilirubin 1.1 0.3 - 1.2 mg/dL   GFR calc non Af Amer 7 (L) >60 mL/min   GFR calc Af Amer 8 (L) >60  mL/min    Comment: (NOTE) The eGFR has been calculated using the CKD EPI equation. This calculation has not been validated in all clinical situations. eGFR's persistently <60 mL/min signify possible Chronic Kidney Disease.    Anion gap 19 (H) 5 - 15  CBC     Status: Abnormal   Collection Time: 11/08/15  4:43 AM  Result Value Ref Range   WBC 12.7 (H) 4.0 - 10.5 K/uL   RBC 2.93 (L) 4.22 - 5.81 MIL/uL   Hemoglobin 7.5 (L) 13.0 - 17.0 g/dL   HCT 22.9 (L) 39.0 - 52.0 %   MCV 78.2 78.0 - 100.0 fL   MCH 25.6 (L) 26.0 - 34.0 pg   MCHC 32.8 30.0 - 36.0 g/dL   RDW 17.8 (H) 11.5 - 15.5 %   Platelets 444 (H) 150 - 400 K/uL    Imaging / Studies: No results found.  Medications / Allergies:  Scheduled Meds: . sodium chloride   Intravenous Once  . amLODipine  10 mg Oral Daily  . aspirin EC  81 mg Oral Daily  . buPROPion  150 mg Oral BID  . calcitRIOL  0.5 mcg Oral Daily  . cinacalcet  60 mg Oral Q breakfast  . darbepoetin (ARANESP) injection - DIALYSIS  200 mcg Intravenous Q Wed-HD  . escitalopram  20 mg Oral Daily  . ferric gluconate (FERRLECIT/NULECIT) IV  125 mg Intravenous Q M,W,F-HD  . heparin  5,000 Units Subcutaneous BID  . hydrALAZINE  100 mg Oral BID  . labetalol  150 mg Oral BID  . pantoprazole  40 mg Oral Daily  . piperacillin-tazobactam (ZOSYN)  IV  2.25 g Intravenous Q8H  . pravastatin  20 mg Oral q1800  . sodium chloride flush  3 mL Intravenous Q12H   Continuous Infusions: . sodium chloride 10 mL/hr at 11/06/15 1405   PRN Meds:.ALPRAZolam, fentaNYL (SUBLIMAZE) injection, gi cocktail, hydrALAZINE, metoprolol, ondansetron **OR** ondansetron (ZOFRAN) IV, oxyCODONE  Antibiotics: Anti-infectives    Start     Dose/Rate Route Frequency Ordered Stop  11/08/15 1200  vancomycin (VANCOCIN) IVPB 1000 mg/200 mL premix  Status:  Discontinued     1,000 mg 200 mL/hr over 60 Minutes Intravenous Every [name]-F (Hemodialysis) 11/07/15 1120 11/07/15 1126   11/07/15 1115  vancomycin  (VANCOCIN) IVPB 1000 mg/200 mL premix     1,000 mg 200 mL/hr over 60 Minutes Intravenous  Once 11/07/15 1100 11/07/15 1206   11/07/15 1105  vancomycin (VANCOCIN) 1-5 GM/200ML-% IVPB    Comments:  Cherylann Banas   : cabinet override      11/07/15 1105 11/07/15 2314   11/06/15 1800  vancomycin (VANCOCIN) IVPB 1000 mg/200 mL premix  Status:  Discontinued     1,000 mg 200 mL/hr over 60 Minutes Intravenous Every [name]-F (Hemodialysis) 11/06/15 0825 11/06/15 1238   11/05/15 1400  azithromycin (ZITHROMAX) 500 mg in dextrose 5 % 250 mL IVPB  Status:  Discontinued     500 mg 250 mL/hr over 60 Minutes Intravenous Every 24 hours 11/05/15 1153 11/07/15 1126   11/04/15 1000  piperacillin-tazobactam (ZOSYN) IVPB 2.25 g     2.25 g 100 mL/hr over 30 Minutes Intravenous Every 8 hours 11/04/15 0902     11/04/15 0900  cefTRIAXone (ROCEPHIN) 1 g in dextrose 5 % 50 mL IVPB  Status:  Discontinued    Comments:  From time of last dose outside hospital   1 g 100 mL/hr over 30 Minutes Intravenous Every 24 hours 11/04/15 0811 11/04/15 0844   11/04/15 0900  azithromycin (ZITHROMAX) 500 mg in dextrose 5 % 250 mL IVPB  Status:  Discontinued    Comments:  From time of last dose outside hospital   500 mg 250 mL/hr over 60 Minutes Intravenous Every 24 hours 11/04/15 0811 11/04/15 0844   11/04/15 0845  piperacillin-tazobactam (ZOSYN) IVPB 3.375 g  Status:  Discontinued    Comments:  From time received ouside er/ 11/04/15  At 315am   3.375 g 100 mL/hr over 30 Minutes Intravenous Every 8 hours 11/04/15 0844 11/04/15 0902       Assessment/Plan Acute gangrenous cholecystitis POD#2 laparoscopic cholecystectomy--Dr. Rosendo Gros  -continue teach, teach patient how to empty. Will arrange follow up in 1 week.  ID-total of 7 days of antibiotics, augmentin okay.  Dispo-stable for DC from a surgical standpoint.   Erby Pian, [facility] Pager [phone]) For consults and floor pages call  [phone])  11/08/2015  10:58 AM

## 2015-11-08 NOTE — Progress Notes (Signed)
PT Cancellation Note  Patient Details Name: Hayden [name] MRN: [id number] DOB: May 27, 1971   Cancelled Treatment:    Reason Eval/Treat Not Completed: Other (comment)   Pt politely declined PT earlier when I attempted session, states he is feeling lousy;   Receiving blood transfusion;   Will follow up later today as time allows;  Otherwise, will follow up for PT tomorrow;   Thank you,  Roney Marion, [place] Pager [phone] Office [phone]     Roney Marion [facility] 11/08/2015, 3:58 PM

## 2015-11-08 NOTE — Discharge Instructions (Signed)

## 2015-11-08 NOTE — Care Management Important Message (Signed)
Important Message  Patient Details  Name: KEMONI RUDELL MRN: [id number] Date of Birth: 1970-10-19   Medicare Important Message Given:  Yes    Lenae Wherley P Karsyn Rochin 11/08/2015, 1:12 PM

## 2015-11-09 DIAGNOSIS — K810 Acute cholecystitis: Secondary | ICD-10-CM

## 2015-11-09 LAB — CBC
HCT: 23.2 % — ABNORMAL LOW (ref 39.0–52.0)
HEMOGLOBIN: 7.5 g/dL — AB (ref 13.0–17.0)
MCH: 25.4 pg — AB (ref 26.0–34.0)
MCHC: 32.3 g/dL (ref 30.0–36.0)
MCV: 78.6 fL (ref 78.0–100.0)
Platelets: 466 10*3/uL — ABNORMAL HIGH (ref 150–400)
RBC: 2.95 MIL/uL — ABNORMAL LOW (ref 4.22–5.81)
RDW: 17.7 % — AB (ref 11.5–15.5)
WBC: 11.7 10*3/uL — ABNORMAL HIGH (ref 4.0–10.5)

## 2015-11-09 LAB — BASIC METABOLIC PANEL
ANION GAP: 23 — AB (ref 5–15)
BUN: 94 mg/dL — AB (ref 6–20)
CHLORIDE: 94 mmol/L — AB (ref 101–111)
CO2: 21 mmol/L — AB (ref 22–32)
Calcium: 6.6 mg/dL — ABNORMAL LOW (ref 8.9–10.3)
Creatinine, Ser: 10.86 mg/dL — ABNORMAL HIGH (ref 0.61–1.24)
GFR calc Af Amer: 6 mL/min — ABNORMAL LOW (ref >60)
GFR, EST NON AFRICAN AMERICAN: 5 mL/min — AB (ref >60)
GLUCOSE: 87 mg/dL (ref 65–99)
Potassium: 3.4 mmol/L — ABNORMAL LOW (ref 3.5–5.1)
Sodium: 138 mmol/L (ref 135–145)

## 2015-11-09 LAB — CULTURE, BLOOD (ROUTINE X 2)
CULTURE: NO GROWTH
Culture: NO GROWTH

## 2015-11-09 LAB — PREPARE RBC (CROSSMATCH)

## 2015-11-09 MED ORDER — AMOXICILLIN-POT CLAVULANATE 500-125 MG PO TABS: 1.0000 | ORAL_TABLET | Freq: Every day | ORAL | Status: DC

## 2015-11-09 MED ORDER — SODIUM CHLORIDE 0.9 % IV SOLN: Freq: Once | INTRAVENOUS | Status: DC

## 2015-11-09 MED ORDER — LOPERAMIDE HCL 2 MG PO CAPS
2.0000 mg | ORAL_CAPSULE | Freq: Once | ORAL | Status: AC
Start: 2015-11-09 — End: 2015-11-09
  Administered 2015-11-09: 2 mg via ORAL
  Filled 2015-11-09: qty 1

## 2015-11-09 MED ORDER — SACCHAROMYCES BOULARDII 250 MG PO CAPS: 250.0000 mg | ORAL_CAPSULE | Freq: Two times a day (BID) | ORAL | Status: DC

## 2015-11-09 MED ORDER — OXYCODONE HCL 5 MG PO TABS: 5.0000 mg | ORAL_TABLET | Freq: Four times a day (QID) | ORAL | Status: DC | PRN

## 2015-11-09 NOTE — Progress Notes (Signed)
PT Cancellation Note  Pt not available for tx, he is at hemodialysis. Will follow.   Blondell Reveal Kistler PT 11/09/2015  [phone]

## 2015-11-09 NOTE — Progress Notes (Signed)
Pt has been in HD since 0700. Pt remains in HD. Unable to assess patient at this time.

## 2015-11-09 NOTE — Discharge Summary (Signed)
Hayden [name], is a 45 y.o. male  DOB May 14, 1971  MRN [id number].  Admission date:  11/04/2015  Admitting Physician  Maren Reamer, MD  Discharge Date:  11/09/2015   Primary MD  Louis Meckel, MD  Recommendations for primary care physician for things to follow:  - Please check CBC, BMP during next visit - Patient to follow with surgery as an outpatient   Admission Diagnosis  DIALYSIS RENAL Gallstones   Discharge Diagnosis  DIALYSIS RENAL Gallstones   Principal Problem:   Pneumonia involving right lung Active Problems:   Acute kidney injury ([place])   History of partial Right nephrectomy for renal mass (2008)   End stage renal disease on dialysis ([place])   Lacunar infarct, acute (H[facility])   HTN (hypertension)   Gastroenteritis   Right lower quadrant pain      Past Medical History  Diagnosis Date  . History of kidney cancer   . Hypertension   . CHF (congestive heart failure) ([place]) [id number]    Acute systolic and diastolic CHF  . Renal disorder   . Obesity   . Hyperlipidemia   . Anemia March 2014  . Cancer ([place])     Kidney  . PONV (postoperative nausea and vomiting)   . Depression     & rage --  was in counseling....great now  . Diabetes mellitus     NO DM SINCE LOST 130LBS  . Stroke [facility])     Past Surgical History  Procedure Laterality Date  . Nephrectomy Right 2008    partial  . Testicle torsion reduction    . Hernia repair    . Tonsillectomy and adenoidectomy    . Av fistula placement Left 01/26/2013    Procedure: ARTERIOVENOUS (AV) FISTULA CREATION - LEFT RADIAL CEPHALIC AVF;  Surgeon: Angelia Mould, MD;  Location: [place];  Service: Vascular;  Laterality: Left;  . Cholecystectomy  11/06/2015    Procedure: LAPAROSCOPIC CHOLECYSTECTOMY;  Surgeon: Ralene Ok, MD;  Location: [place];  Service: General;;       History of present illness and  Hospital Course:      Kindly see H&P for history of present illness and admission details, please review complete Labs, Consult reports and Test reports for all details in brief  HPI  from the history and physical done on the day of admission 11/04/2015  HPI: Hayden [name] is a 45 y.o. male with end-stage renal disease on home hemodialysis, Hypertension, lacunar infarcts in 06/2015, answered spell outside hospital ER [place], [facility] ER) with community-acquired pneumonia. Of note, he stated he had some viral flulike illness, Monday through Wednesday with nonproductive cough, low-grade temperatures 101-102. Started feeling better until Wednesday afternoon started having severe abdominal pain with mild nausea, described as a burning sensation. He had a hard time getting comfortable and finally went to the ER last night around 2:00 am where they had diagnosed him with Community acquired pneumonia in the right lower lobe. Of note, he said he completed a full session of  hemodialysis last night. He currently denies any shortness of breath or chest pains but does have severe abdominal discomfort described as overall intensity.  Outside ER dosed him w/ Rocephin, azithromycin, Zosyn as well as vancomycin prior to transfer.  Outside vs: 132/76 t 97.2 Hr 93 rr 16 92% ra   Hospital Course   Brief Narrative Hayden [name] is a 45 y.o. male with end-stage renal disease on home hemodialysis, Hypertension, lacunar infarcts in 06/2015, answered spell outside hospital ER [place], [facility] ER) with community-acquired pneumonia. Of note, he stated he had some viral flulike illness, Monday through Wednesday with nonproductive cough, low-grade temperatures 101-102. Started feeling better until Wednesday afternoon started having severe abdominal pain with mild nausea, described as a burning sensation.   He was admitted with running diagnosis of pneumonia further workup On 11/06/2015 showed acute cholecystitis, I assumed  his care on day 2 on 11/06/2015, Gen. surgery was consulted along with renal for dialysis. He underwent laparoscopic cholecystectomy on 11/06/2015.   1.Right upper quadrant abdominal Pain due to acute cholecystitis -  Status post laparoscopic cholecystectomy by general surgery Dr. Dyann Kief  on 11/06/2015, treated with total of 5 days of IV vancomycin during hospital stay, to finish another 3 days as an outpatient with Augmentin , tolerating soft diet , pain is significantly controlled, will be discharged with JP drain, with outpatient follow-up with surgery .  2. ESRD. On Monday, Wednesday and Friday schedule during hospital stay, he is usually on home hemodialysis.  3. Possibility of pneumonia. He really does not have any cough or shortness of breath. Treated with IV Zosyn   4. Essential hypertension. Home medications continued on discharge  5. HX of stroke in the past. No acute issue. Supportive care. Continue aspirin and statin for secondary prevention.  6. Dyslipidemia. On statin  8. Anemia of chronic disease along with perioperative blood loss treated anemia. Patient transfused 1 unit PRBC on 5/10, and 1 unit on 5/11.  Discharge Condition:  Stable   Follow UP  Follow-up Information    Follow up with Reyes Ivan, MD On 11/14/2015.   Specialty:  General Surgery   Why:  arrive by 3:15PM for a 3:40PM post op check and drain check   Contact information:   [address] [place] [address] [phone]       Follow up with GOLDSBOROUGH,KELLIE A, MD. Schedule an appointment as soon as possible for a visit in 1 week.   Specialty:  Nephrology   Contact information:   [place] [address] [phone]         Discharge Instructions  and  Discharge Medications    Discharge Instructions    Discharge instructions    Complete by:  As directed   Follow with Primary MD Corliss Parish A, MD in 7 days   Get CBC, CMP,  checked  by Primary MD  next visit.    Activity: As tolerated with Full fall precautions use walker/cane & assistance as needed   Disposition Home    Diet: Renal, heart healthy , with feeding assistance and aspiration precautions.  For Heart failure patients - Check your Weight same time everyday, if you gain over 2 pounds, or you develop in leg swelling, experience more shortness of breath or chest pain, call your Primary MD immediately. Follow Cardiac Low Salt Diet and 1.5 lit/day fluid restriction.   On your next visit with your primary care physician please Get Medicines reviewed and adjusted.  Please request your Prim.MD to go over all Hospital Tests and Procedure/Radiological results at the follow up, please get all Hospital records sent to your Prim MD by signing hospital release before you go home.   If you experience worsening of your admission symptoms, develop shortness of breath, life threatening emergency, suicidal or homicidal thoughts you must seek medical attention immediately by calling 911 or calling your MD immediately  if symptoms less severe.  You Must read complete instructions/literature along with all the possible adverse reactions/side effects for all the Medicines you take and that have been prescribed to you. Take any new Medicines after you have completely understood and accpet all the possible adverse reactions/side effects.   Do not drive, operating heavy machinery, perform activities at heights, swimming or participation in water activities or provide baby sitting services if your were admitted for syncope or siezures until you have seen by Primary MD or a Neurologist and advised to do so again.  Do not drive when taking Pain medications.    Do not take more than prescribed Pain, Sleep and Anxiety Medications  Special Instructions: If you have smoked or chewed Tobacco  in the last 2 yrs please stop smoking, stop any regular Alcohol  and or any Recreational drug use.  Wear  Seat belts while driving.   Please note  You were cared for by a hospitalist during your hospital stay. If you have any questions about your discharge medications or the care you received while you were in the hospital after you are discharged, you can call the unit and asked to speak with the hospitalist on call if the hospitalist that took care of you is not available. Once you are discharged, your primary care physician will handle any further medical issues. Please note that NO REFILLS for any discharge medications will be authorized once you are discharged, as it is imperative that you return to your primary care physician (or establish a relationship with a primary care physician if you do not have one) for your aftercare needs so that they can reassess your need for medications and monitor your lab values.            Medication List    STOP taking these medications        VANCOMYCIN HCL IV      TAKE these medications        ALPRAZolam 1 MG tablet  Commonly known as:  XANAX  Take 1 mg by mouth at bedtime as needed for anxiety.     amLODipine 10 MG tablet  Commonly known as:  NORVASC  Take 10 mg by mouth daily.     amoxicillin-clavulanate 500-125 MG tablet  Commonly known as:  AUGMENTIN  Take 1 tablet (500 mg total) by mouth daily.     aspirin EC 81 MG tablet  Take 81 mg by mouth daily.     buPROPion 150 MG 12 hr tablet  Commonly known as:  WELLBUTRIN SR  Take one tab by mouth every morning & two tabs every evening     calcitRIOL 0.5 MCG capsule  Commonly known as:  ROCALTROL  Take 0.5 mcg by mouth daily.     cinacalcet 30 MG tablet  Commonly known as:  SENSIPAR  Take 60 mg by mouth daily.     cloNIDine 0.2 mg/24hr patch  Commonly known as:  CATAPRES - Dosed in mg/24 hr  Place 1 patch onto the skin once a week.     escitalopram 20  MG tablet  Commonly known as:  LEXAPRO  Take 20 mg by mouth daily.     furosemide 80 MG tablet  Commonly known as:  LASIX    Take 80 mg by mouth 2 (two) times daily.     hydrALAZINE 100 MG tablet  Commonly known as:  APRESOLINE  Take 100 mg by mouth 2 (two) times daily.     labetalol 100 MG tablet  Commonly known as:  NORMODYNE  Take 150 mg by mouth 2 (two) times daily.     omeprazole 40 MG capsule  Commonly known as:  PRILOSEC  Take 40 mg by mouth daily.     oxyCODONE 5 MG immediate release tablet  Commonly known as:  Oxy IR/ROXICODONE  Take 1 tablet (5 mg total) by mouth every 6 (six) hours as needed for severe pain.     pravastatin 20 MG tablet  Commonly known as:  PRAVACHOL  Take 20 mg by mouth daily.     saccharomyces boulardii 250 MG capsule  Commonly known as:  FLORASTOR  Take 1 capsule (250 mg total) by mouth 2 (two) times daily.     VITAMIN D PO  Take 1 capsule by mouth daily.          Diet and Activity recommendation: See Discharge Instructions above   Consults obtained -  Renal Surgery  Major procedures and Radiology Reports - PLEASE review detailed and final reports for all details, in brief -   Laparoscopic cholecystectomy on 11/06/2015   Ct Abdomen Pelvis Wo Contrast  11/06/2015  CLINICAL DATA:  Right lower quadrant pain.  Fever EXAM: CT ABDOMEN AND PELVIS WITHOUT CONTRAST TECHNIQUE: Multidetector CT imaging of the abdomen and pelvis was performed following the standard protocol without IV contrast. COMPARISON:  CT 05/16/2014 FINDINGS: Lower chest: Right lower lobe infiltrate with air bronchograms. Probable pneumonia. Negative for pleural effusion. Left lung base clear. Hepatobiliary: Unenhanced images of the liver are negative. Markedly distended gallbladder with increased density in the gallbladder lumen suggesting sludge or hemorrhage or cholecystitis. There is mild edema surrounding the gallbladder neck consistent with acute inflammation. Bile ducts nondilated. Pancreas: Negative Spleen: Negative Adrenals/Urinary Tract: Negative for renal obstruction. 3 mm nonobstructing  stone right lower pole. Fatty lesion left adrenal gland is unchanged may represent lipoma or myelo lipoma. Urinary bladder normal. Stomach/Bowel: Oral contrast is present in the stomach and small bowel extending into the colon and rectum. Small bowel mildly distended with air-fluid levels. This may represent ileus or low-grade obstruction. Normal appendix. There is prominent thickening of the duodenum in the second and third portions. There is stranding in the adjacent fat around the duodenum. This could represent inflammatory changes from peptic ulcer disease or duodenitis. Carcinoma cannot be excluded. Duodenal thickening is near the distended gallbladder however these may be two separate processes. Vascular/Lymphatic: Atherosclerotic calcification in the aorta and iliac arteries. No aneurysm. No lymphadenopathy. Reproductive: Negative prostate.  No bladder wall thickening. Other: No free-fluid Musculoskeletal: Disc degeneration and spurring L4-5 and L5-S1. No fracture or bony mass lesion. IMPRESSION: Markedly distended gallbladder with diffusely increased density in the gallbladder. This could represent acute cholecystitis versus poor functioning of the gallbladder filled with sludge. Thickening of the second and third portions of the duodenum with surrounding edema in the adjacent fat. This may represent peptic ulcer disease or duodenitis. Carcinoma not excluded. Small bowel is mildly distended with air-fluid levels suggesting ileus or partial small obstruction. Oral contrast extends into the rectum Right lower lobe infiltrate, probable  pneumonia. Normal appendix Electronically Signed   By: Franchot Gallo M.D.   On: 11/06/2015 08:12   Dg Chest 2 View  11/04/2015  CLINICAL DATA:  Pneumonia. EXAM: CHEST  2 VIEW COMPARISON:  06/20/2015 FINDINGS: Focal airspace disease in the right lower lobe which is new from prior. No cavitation or effusion. Generous heart size without definitive cardiomegaly. Stable aortic and  hilar contours. IMPRESSION: Right lower lobe pneumonia. Followup PA and lateral chest X-ray is recommended in 3-4 weeks following trial of antibiotic therapy to ensure resolution. Electronically Signed   By: Monte Fantasia M.D.   On: 11/04/2015 10:14   Dg Abd 1 View  11/04/2015  CLINICAL DATA:  Two day history of abdominal pain EXAM: ABDOMEN - 1 VIEW COMPARISON:  CT abdomen and pelvis May 16, 2014 FINDINGS: There is no appreciable bowel dilatation or air-fluid level suggesting obstruction. No free air is seen on this supine examination. There are vascular calcifications in the pelvis. IMPRESSION: No demonstrable bowel obstruction or free air. Electronically Signed   By: Lowella Grip III M.D.   On: 11/04/2015 10:13   US Abdomen Limited Ruq  11/06/2015  CLINICAL DATA:  Right upper quadrant pain for 4 days EXAM: US ABDOMEN LIMITED - RIGHT UPPER QUADRANT COMPARISON:  CT abdomen and pelvis Nov 05, 2015 FINDINGS: Gallbladder: Gallbladder appears distended with wall thickening and wall edema. There is sludge in the gallbladder with small intermingled gallstones. The patient is focally tender over the gallbladder. There is equivocal pericholecystic fluid. Common bile duct: Diameter: 4 mm. There is no intrahepatic or extrahepatic biliary duct dilatation. Liver: No focal lesion identified. Within normal limits in parenchymal echogenicity. IMPRESSION: Findings as described above consistent with a degree of acute cholecystitis. These results will be called to the ordering clinician or representative by the Radiologist Assistant, and communication documented in the PACS or zVision Dashboard. Electronically Signed   By: Lowella Grip III M.D.   On: 11/06/2015 10:10    Micro Results   Recent Results (from the past 240 hour(s))  Culture, blood (routine x 2) Call MD if unable to obtain prior to antibiotics being given     Status: None (Preliminary result)   Collection Time: 11/04/15  8:50 AM  Result Value  Ref Range Status   Specimen Description BLOOD RIGHT HAND  Final   Special Requests IN PEDIATRIC BOTTLE 1CC  Final   Culture NO GROWTH 4 DAYS  Final   Report Status PENDING  Incomplete  Culture, blood (routine x 2) Call MD if unable to obtain prior to antibiotics being given     Status: None (Preliminary result)   Collection Time: 11/04/15  9:00 AM  Result Value Ref Range Status   Specimen Description BLOOD RIGHT HAND  Final   Special Requests IN PEDIATRIC BOTTLE 1CC  Final   Culture NO GROWTH 4 DAYS  Final   Report Status PENDING  Incomplete       Today   Subjective:   Hayden [name] today has no headache,no chest or abdominal pain,no new weakness tingling or numbness, feels much better wants to go home today.   Objective:   Blood pressure 184/81, pulse 90, temperature 98.3 F (36.8 C), temperature source Oral, resp. rate 20, weight 104.3 kg (229 lb 15 oz), SpO2 95 %.   Intake/Output Summary (Last 24 hours) at 11/09/15 1143 Last data filed at 11/09/15 1000  Gross per 24 hour  Intake    965 ml  Output    820 ml  Net    145 ml    Exam Awake Alert, Oriented X 3, No new F.N deficits, Normal affect [place].AT,PERRAL Supple Neck,No JVD, No cervical lymphadenopathy appriciated.  Symmetrical Chest wall movement, Good air movement bilaterally, CTAB RRR,No Gallops,Rubs or new Murmurs, No Parasternal Heave +ve B.Sounds, Abd Soft , Right upper quadrant JP drain in place, No organomegaly appriciated, No rebound - guarding or rigidity. No Cyanosis, Clubbing or edema, No new Rash or bruise  Data Review   CBC w Diff: Lab Results  Component Value Date   WBC 11.7* 11/09/2015   HGB 7.5* 11/09/2015   HCT 23.2* 11/09/2015   PLT 466* 11/09/2015   LYMPHOPCT 3 11/04/2015   MONOPCT 4 11/04/2015   EOSPCT 0 11/04/2015   BASOPCT 0 11/04/2015    CMP: Lab Results  Component Value Date   NA 138 11/09/2015   K 3.4* 11/09/2015   CL 94* 11/09/2015   CO2 21* 11/09/2015   BUN 94*  11/09/2015   CREATININE 10.86* 11/09/2015   PROT 5.4* 11/08/2015   ALBUMIN 1.9* 11/08/2015   BILITOT 1.1 11/08/2015   ALKPHOS 254* 11/08/2015   AST 55* 11/08/2015   ALT 42 11/08/2015  .   Total Time in preparing paper work, data evaluation and todays exam - 35 minutes  Hayden [name] M.D on 11/09/2015 at 11:43 AM  [name]   Office  [phone]

## 2015-11-09 NOTE — Procedures (Signed)
I have seen and examined this patient and agree with the plan of care     Seen on dialysis  Hb 7.5   Ca 6.6 Alb 1.9  Phos 7.4    Hayden [name] W 11/09/2015, 8:28 AM

## 2015-11-09 NOTE — Progress Notes (Signed)
Order for Imodium 2mg  has been placed. RN will implement and continue to monitor patient.   Ermalinda Memos, RN

## 2015-11-09 NOTE — Progress Notes (Addendum)
Pt and Wife educated on care of JP drain at home and instructed on how to empty, care for, measure output and when to notify surgeon.

## 2015-11-09 NOTE — Progress Notes (Signed)
Patient had 3 loose stools in the past 24 hours. On call NP notified, RN awaiting orders. RN will continue to monitor patient.   Ermalinda Memos, RN

## 2015-11-10 LAB — TYPE AND SCREEN
ABO/RH(D): A NEG
Antibody Screen: NEGATIVE
UNIT DIVISION: 0
UNIT DIVISION: 0

## 2015-11-15 ENCOUNTER — Emergency Department (HOSPITAL_COMMUNITY): Payer: 59

## 2015-11-15 ENCOUNTER — Inpatient Hospital Stay (HOSPITAL_COMMUNITY)
Admission: EM | Admit: 2015-11-15 | Discharge: 2015-11-20 | DRG: 919 | Disposition: A | Discharge status: Home / Self Care | Payer: 59 | Attending: Internal Medicine | Admitting: Internal Medicine

## 2015-11-15 ENCOUNTER — Encounter (HOSPITAL_COMMUNITY): Payer: Self-pay | Admitting: *Deleted

## 2015-11-15 DIAGNOSIS — S3690XD Unspecified injury of unspecified intra-abdominal organ, subsequent encounter: Secondary | ICD-10-CM | POA: Diagnosis not present

## 2015-11-15 DIAGNOSIS — Z8249 Family history of ischemic heart disease and other diseases of the circulatory system: Secondary | ICD-10-CM

## 2015-11-15 DIAGNOSIS — K219 Gastro-esophageal reflux disease without esophagitis: Secondary | ICD-10-CM | POA: Diagnosis present

## 2015-11-15 DIAGNOSIS — K91870 Postprocedural hematoma of a digestive system organ or structure following a digestive system procedure: Secondary | ICD-10-CM | POA: Diagnosis present

## 2015-11-15 DIAGNOSIS — Z6831 Body mass index (BMI) 31.0-31.9, adult: Secondary | ICD-10-CM | POA: Diagnosis not present

## 2015-11-15 DIAGNOSIS — N186 End stage renal disease: Secondary | ICD-10-CM | POA: Diagnosis present

## 2015-11-15 DIAGNOSIS — Z7982 Long term (current) use of aspirin: Secondary | ICD-10-CM | POA: Diagnosis not present

## 2015-11-15 DIAGNOSIS — Z85528 Personal history of other malignant neoplasm of kidney: Secondary | ICD-10-CM | POA: Diagnosis not present

## 2015-11-15 DIAGNOSIS — Z992 Dependence on renal dialysis: Secondary | ICD-10-CM | POA: Diagnosis not present

## 2015-11-15 DIAGNOSIS — R531 Weakness: Secondary | ICD-10-CM | POA: Diagnosis present

## 2015-11-15 DIAGNOSIS — I10 Essential (primary) hypertension: Secondary | ICD-10-CM | POA: Diagnosis present

## 2015-11-15 DIAGNOSIS — S301XXA Contusion of abdominal wall, initial encounter: Secondary | ICD-10-CM

## 2015-11-15 DIAGNOSIS — I248 Other forms of acute ischemic heart disease: Secondary | ICD-10-CM | POA: Diagnosis present

## 2015-11-15 DIAGNOSIS — Y95 Nosocomial condition: Secondary | ICD-10-CM | POA: Diagnosis present

## 2015-11-15 DIAGNOSIS — E785 Hyperlipidemia, unspecified: Secondary | ICD-10-CM | POA: Diagnosis present

## 2015-11-15 DIAGNOSIS — D7589 Other specified diseases of blood and blood-forming organs: Secondary | ICD-10-CM | POA: Diagnosis present

## 2015-11-15 DIAGNOSIS — F17210 Nicotine dependence, cigarettes, uncomplicated: Secondary | ICD-10-CM | POA: Diagnosis present

## 2015-11-15 DIAGNOSIS — I1311 Hypertensive heart and chronic kidney disease without heart failure, with stage 5 chronic kidney disease, or end stage renal disease: Secondary | ICD-10-CM | POA: Diagnosis present

## 2015-11-15 DIAGNOSIS — I5022 Chronic systolic (congestive) heart failure: Secondary | ICD-10-CM | POA: Diagnosis present

## 2015-11-15 DIAGNOSIS — J189 Pneumonia, unspecified organism: Secondary | ICD-10-CM | POA: Diagnosis present

## 2015-11-15 DIAGNOSIS — S3690XA Unspecified injury of unspecified intra-abdominal organ, initial encounter: Secondary | ICD-10-CM

## 2015-11-15 DIAGNOSIS — Z8673 Personal history of transient ischemic attack (TIA), and cerebral infarction without residual deficits: Secondary | ICD-10-CM | POA: Diagnosis not present

## 2015-11-15 DIAGNOSIS — R911 Solitary pulmonary nodule: Secondary | ICD-10-CM

## 2015-11-15 DIAGNOSIS — D62 Acute posthemorrhagic anemia: Secondary | ICD-10-CM | POA: Diagnosis present

## 2015-11-15 DIAGNOSIS — D649 Anemia, unspecified: Secondary | ICD-10-CM | POA: Diagnosis not present

## 2015-11-15 DIAGNOSIS — Y838 Other surgical procedures as the cause of abnormal reaction of the patient, or of later complication, without mention of misadventure at the time of the procedure: Secondary | ICD-10-CM | POA: Diagnosis present

## 2015-11-15 DIAGNOSIS — Z905 Acquired absence of kidney: Secondary | ICD-10-CM | POA: Diagnosis not present

## 2015-11-15 DIAGNOSIS — Z79899 Other long term (current) drug therapy: Secondary | ICD-10-CM

## 2015-11-15 DIAGNOSIS — I639 Cerebral infarction, unspecified: Secondary | ICD-10-CM | POA: Diagnosis present

## 2015-11-15 DIAGNOSIS — E669 Obesity, unspecified: Secondary | ICD-10-CM | POA: Diagnosis present

## 2015-11-15 LAB — CBC WITH DIFFERENTIAL/PLATELET
Basophils Absolute: 0 10*3/uL (ref 0.0–0.1)
Basophils Relative: 0 %
EOS ABS: 0.5 10*3/uL (ref 0.0–0.7)
EOS PCT: 2 %
HCT: 20.6 % — ABNORMAL LOW (ref 39.0–52.0)
Hemoglobin: 6.9 g/dL — CL (ref 13.0–17.0)
Lymphocytes Relative: 3 %
Lymphs Abs: 0.8 10*3/uL (ref 0.7–4.0)
MCH: 25.8 pg — ABNORMAL LOW (ref 26.0–34.0)
MCHC: 32 g/dL (ref 30.0–36.0)
MCV: 80.5 fL (ref 78.0–100.0)
MONO ABS: 1.6 10*3/uL — AB (ref 0.1–1.0)
Monocytes Relative: 6 %
NEUTROS PCT: 89 %
Neutro Abs: 24.3 10*3/uL — ABNORMAL HIGH (ref 1.7–7.7)
PLATELETS: 575 10*3/uL — AB (ref 150–400)
RBC: 2.56 MIL/uL — AB (ref 4.22–5.81)
RDW: 17.8 % — AB (ref 11.5–15.5)
WBC: 27.2 10*3/uL — AB (ref 4.0–10.5)

## 2015-11-15 LAB — MRSA PCR SCREENING: MRSA BY PCR: NEGATIVE

## 2015-11-15 LAB — COMPREHENSIVE METABOLIC PANEL
ALT: 38 U/L (ref 17–63)
AST: 34 U/L (ref 15–41)
Albumin: 2.1 g/dL — ABNORMAL LOW (ref 3.5–5.0)
Alkaline Phosphatase: 220 U/L — ABNORMAL HIGH (ref 38–126)
Anion gap: 18 — ABNORMAL HIGH (ref 5–15)
BUN: 55 mg/dL — ABNORMAL HIGH (ref 6–20)
CO2: 24 mmol/L (ref 22–32)
Calcium: 7.8 mg/dL — ABNORMAL LOW (ref 8.9–10.3)
Chloride: 96 mmol/L — ABNORMAL LOW (ref 101–111)
Creatinine, Ser: 8.79 mg/dL — ABNORMAL HIGH (ref 0.61–1.24)
GFR, EST AFRICAN AMERICAN: 8 mL/min — AB (ref >60)
GFR, EST NON AFRICAN AMERICAN: 7 mL/min — AB (ref >60)
Glucose, Bld: 96 mg/dL (ref 65–99)
POTASSIUM: 3.2 mmol/L — AB (ref 3.5–5.1)
SODIUM: 138 mmol/L (ref 135–145)
Total Bilirubin: 0.8 mg/dL (ref 0.3–1.2)
Total Protein: 5.3 g/dL — ABNORMAL LOW (ref 6.5–8.1)

## 2015-11-15 LAB — IRON AND TIBC
IRON: 7 ug/dL — AB (ref 45–182)
Saturation Ratios: 4 % — ABNORMAL LOW (ref 17.9–39.5)
TIBC: 164 ug/dL — AB (ref 250–450)
UIBC: 157 ug/dL

## 2015-11-15 LAB — PREPARE RBC (CROSSMATCH)

## 2015-11-15 LAB — TROPONIN I
TROPONIN I: 0.05 ng/mL — AB (ref <0.031)
TROPONIN I: 0.06 ng/mL — AB (ref <0.031)

## 2015-11-15 LAB — LIPASE, BLOOD: LIPASE: 85 U/L — AB (ref 11–51)

## 2015-11-15 LAB — RETICULOCYTES
RBC.: 2.63 MIL/uL — ABNORMAL LOW (ref 4.22–5.81)
RETIC COUNT ABSOLUTE: 63.1 10*3/uL (ref 19.0–186.0)
RETIC CT PCT: 2.4 % (ref 0.4–3.1)

## 2015-11-15 LAB — FOLATE: Folate: 3.4 ng/mL — ABNORMAL LOW (ref >5.9)

## 2015-11-15 LAB — VITAMIN B12: VITAMIN B 12: 712 pg/mL (ref 180–914)

## 2015-11-15 LAB — MAGNESIUM: MAGNESIUM: 2.2 mg/dL (ref 1.7–2.4)

## 2015-11-15 LAB — FERRITIN: FERRITIN: 1444 ng/mL — AB (ref 24–336)

## 2015-11-15 MED ORDER — PRAVASTATIN SODIUM 20 MG PO TABS
20.0000 mg | ORAL_TABLET | Freq: Every day | ORAL | Status: DC
  Administered 2015-11-16 – 2015-11-19 (×4): 20 mg via ORAL
  Filled 2015-11-15 (×4): qty 1

## 2015-11-15 MED ORDER — FUROSEMIDE 10 MG/ML IJ SOLN
60.0000 mg | Freq: Once | INTRAMUSCULAR | Status: AC
  Administered 2015-11-15: 60 mg via INTRAVENOUS
  Filled 2015-11-15: qty 6

## 2015-11-15 MED ORDER — ALPRAZOLAM 0.5 MG PO TABS
1.0000 mg | ORAL_TABLET | Freq: Every evening | ORAL | Status: DC | PRN
  Administered 2015-11-15: 1 mg via ORAL
  Filled 2015-11-15: qty 2

## 2015-11-15 MED ORDER — ACETAMINOPHEN 325 MG PO TABS: 650.0000 mg | ORAL_TABLET | Freq: Four times a day (QID) | ORAL | Status: DC | PRN

## 2015-11-15 MED ORDER — VANCOMYCIN HCL IN DEXTROSE 1-5 GM/200ML-% IV SOLN: 1000.0000 mg | Freq: Once | INTRAVENOUS | Status: DC

## 2015-11-15 MED ORDER — FUROSEMIDE 10 MG/ML IJ SOLN: 40.0000 mg | Freq: Once | INTRAMUSCULAR | Status: DC

## 2015-11-15 MED ORDER — PIPERACILLIN-TAZOBACTAM 3.375 G IVPB 30 MIN
3.3750 g | Freq: Once | INTRAVENOUS | Status: AC
  Administered 2015-11-15: 3.375 g via INTRAVENOUS
  Filled 2015-11-15: qty 50

## 2015-11-15 MED ORDER — VANCOMYCIN HCL 10 G IV SOLR
2000.0000 mg | Freq: Once | INTRAVENOUS | Status: AC
  Administered 2015-11-15: 2000 mg via INTRAVENOUS
  Filled 2015-11-15: qty 2000

## 2015-11-15 MED ORDER — SODIUM CHLORIDE 0.9% FLUSH
3.0000 mL | Freq: Two times a day (BID) | INTRAVENOUS | Status: DC
  Administered 2015-11-16 – 2015-11-20 (×9): 3 mL via INTRAVENOUS

## 2015-11-15 MED ORDER — SODIUM CHLORIDE 0.9 % IV SOLN: INTRAVENOUS | Status: DC

## 2015-11-15 MED ORDER — SACCHAROMYCES BOULARDII 250 MG PO CAPS
250.0000 mg | ORAL_CAPSULE | Freq: Two times a day (BID) | ORAL | Status: DC
  Administered 2015-11-15 – 2015-11-20 (×11): 250 mg via ORAL
  Filled 2015-11-15 (×10): qty 1

## 2015-11-15 MED ORDER — PANTOPRAZOLE SODIUM 40 MG PO TBEC
40.0000 mg | DELAYED_RELEASE_TABLET | Freq: Every day | ORAL | Status: DC
  Administered 2015-11-16 – 2015-11-20 (×5): 40 mg via ORAL
  Filled 2015-11-15 (×5): qty 1

## 2015-11-15 MED ORDER — ONDANSETRON HCL 4 MG PO TABS: 4.0000 mg | ORAL_TABLET | Freq: Four times a day (QID) | ORAL | Status: DC | PRN

## 2015-11-15 MED ORDER — ONDANSETRON HCL 4 MG/2ML IJ SOLN: 4.0000 mg | Freq: Four times a day (QID) | INTRAMUSCULAR | Status: DC | PRN

## 2015-11-15 MED ORDER — PIPERACILLIN-TAZOBACTAM IN DEX 2-0.25 GM/50ML IV SOLN
2.2500 g | Freq: Three times a day (TID) | INTRAVENOUS | Status: DC
  Administered 2015-11-16 – 2015-11-18 (×9): 2.25 g via INTRAVENOUS
  Filled 2015-11-15 (×13): qty 50

## 2015-11-15 MED ORDER — ACETAMINOPHEN 650 MG RE SUPP: 650.0000 mg | Freq: Four times a day (QID) | RECTAL | Status: DC | PRN

## 2015-11-15 MED ORDER — SODIUM CHLORIDE 0.9 % IV SOLN
Freq: Once | INTRAVENOUS | Status: AC
  Administered 2015-11-15: 14:00:00 via INTRAVENOUS

## 2015-11-15 MED ORDER — SODIUM CHLORIDE 0.9 % IV SOLN: Freq: Once | INTRAVENOUS | Status: DC

## 2015-11-15 MED ORDER — DIATRIZOATE MEGLUMINE & SODIUM 66-10 % PO SOLN
ORAL | Status: AC
  Filled 2015-11-15: qty 30

## 2015-11-15 MED ORDER — DIATRIZOATE MEGLUMINE & SODIUM 66-10 % PO SOLN
30.0000 mL | Freq: Once | ORAL | Status: DC
  Filled 2015-11-15: qty 30

## 2015-11-15 MED ORDER — LEVALBUTEROL HCL 0.63 MG/3ML IN NEBU: 0.6300 mg | INHALATION_SOLUTION | Freq: Four times a day (QID) | RESPIRATORY_TRACT | Status: DC | PRN

## 2015-11-15 NOTE — ED Provider Notes (Signed)
Report called to dr Allyson Sabal for admission Pt admitted with worsening anemia and also HCAP   Ripley Fraise, MD 11/15/15 1800

## 2015-11-15 NOTE — ED Notes (Signed)
Dr. Venora Maples notified of critical hgb.

## 2015-11-15 NOTE — ED Notes (Signed)
Patient transported to X-ray 

## 2015-11-15 NOTE — Progress Notes (Signed)
Pharmacy Antibiotic Note  Hayden [name] is a 45 y.o. male admitted on 11/15/2015 with pneumonia.  Pharmacy has been consulted for vancomycin and zosyn dosing. Pt does at home dialysis Monday through Friday with his last session this morning for 3h and 3min.  Pt received vancomycin 2g and zosyn 3.375g IV once in the ED.  Plan: Vancomycin 1g IV qHD Zosyn 2.25g IV q8h Monitor culture data, HD plans and clinical course Vancomycin preHD target level 20-25 mcg/mL VT at SS prn     Temp (24hrs), Avg:98.4 F (36.9 C), Min:98.3 F (36.8 C), Max:98.5 F (36.9 C)   Recent Labs Lab 11/09/15 0348 11/15/15 1202  WBC 11.7* 27.2*  CREATININE 10.86* 8.79*    Estimated Creatinine Clearance: 13.1 mL/min (by C-G formula based on Cr of 8.79).    Allergies  Allergen Reactions  . Dilaudid [Hydromorphone Hcl] Other (See Comments)    Abnormal behavior, "verbal and physically abusive", "psychosis"  . Morphine And Related Itching    Syncopal episode    Antimicrobials this admission: Vanc 5/17 >>  Zosyn 5/17 >>   Dose adjustments this admission: n/a  Microbiology results:  BCx:   UCx:    Sputum:    MRSA PCR:   Andrey Cota. Diona Foley, PharmD, [place] Clinical Pharmacist Pager [phone] 11/15/2015 6:12 PM

## 2015-11-15 NOTE — ED Notes (Signed)
Pt states he is experiencing visual hallucinations and having conversations with people that aren't there. Dr. Venora Maples informed.

## 2015-11-15 NOTE — ED Notes (Signed)
Returned from CT.

## 2015-11-15 NOTE — ED Notes (Signed)
Pt arrives with c/o weakness since his surgery on 11/07/15. Pt states Dr. Rosendo Gros sent them to the ED after follow up labs "came back all messed up". Pt is unaware what was abnormal about lab work. Pt states he "just hasn't been right" since his surgery and is barely eating, but drinking fluids well. Pt seems fatigued upon assessment.

## 2015-11-15 NOTE — ED Notes (Signed)
First unit of blood Unit [id number]  Ended at 17:00

## 2015-11-15 NOTE — H&P (Addendum)
[name] History and Physical  COE SERIGHT [id number] DOB: 12-Nov-1970 DOA: 11/15/2015    PCP: Louis Meckel, MD   Chief Complaint: flank pain  HPI:  Hayden [name] is a 45 y.o. male with end-stage renal disease on home hemodialysis, Hypertension, lacunar infarcts in 06/2015,  admitted between 5/6 and 5/11 for community-acquired pneumonia as well as acute cholecystitis, status post laparoscopic cholecystectomy by Dr. Rosendo Gros on 11/06/15, discharged home with a JP drain presents to the ER today because of flank pain increased weakness, acute blood loss anemia, saw Dr. Rosendo Gros in the office yesterday who wanted him to continue the JP drain as it was still putting out frank serosanguineous discharge, CT abdomen pelvis was obtained due to significantly elevated white count of 27.2 and the patient was found have 12 cm hematoma in the gallbladder fossa, right lower lobe pneumonia consistent with healthcare associated pneumonia..   General surgery consultation was obtained, patient was ordered 2 units of packed red blood cells for hemoglobin of 6.9, patient also on home hemodialysis Monday through Friday and nephrology consulted.     Review of Systems: negative for the following  Constitutional: Denies fever, chills, diaphoresis, appetite change and and positive for fatigue.  HEENT: Denies photophobia, eye pain, redness, hearing loss, ear pain, congestion, sore throat, rhinorrhea, sneezing, mouth sores, trouble swallowing, neck pain, neck stiffness and tinnitus.  Respiratory: Denies SOB, DOE, cough, chest tightness, and wheezing.  Cardiovascular: Denies chest pain, palpitations and leg swelling.  Gastrointestinal: Positive for nausea, vomiting, abdominal pain, diarrhea, constipation, blood in stool and abdominal distention.  Genitourinary: Denies dysuria, urgency, frequency, hematuria, flank pain and difficulty urinating.  Musculoskeletal: Denies myalgias, back pain, joint  swelling, arthralgias and gait problem.  Skin: Denies pallor, rash and wound.  Neurological: Positive for dizziness, seizures, syncope, weakness, light-headedness, numbness and headaches.  Hematological: Denies adenopathy. Easy bruising, personal or family bleeding history  Psychiatric/Behavioral: Denies suicidal ideation, mood changes, confusion, nervousness, sleep disturbance and agitation on positive for muscle      Past Medical History  Diagnosis Date  . History of kidney cancer   . Hypertension   . CHF (congestive heart failure) ([place]) [id number]    Acute systolic and diastolic CHF  . Renal disorder   . Obesity   . Hyperlipidemia   . Anemia March 2014  . Cancer ([place])     Kidney  . PONV (postoperative nausea and vomiting)   . Depression     & rage --  was in counseling....great now  . Diabetes mellitus     NO DM SINCE LOST 130LBS  . Stroke [facility])      Past Surgical History  Procedure Laterality Date  . Nephrectomy Right 2008    partial  . Testicle torsion reduction    . Hernia repair    . Tonsillectomy and adenoidectomy    . Av fistula placement Left 01/26/2013    Procedure: ARTERIOVENOUS (AV) FISTULA CREATION - LEFT RADIAL CEPHALIC AVF;  Surgeon: Angelia Mould, MD;  Location: [place];  Service: Vascular;  Laterality: Left;  . Cholecystectomy  11/06/2015    Procedure: LAPAROSCOPIC CHOLECYSTECTOMY;  Surgeon: Ralene Ok, MD;  Location: [place];  Service: General;;      Social History:  reports that he has been smoking Cigarettes.  He has a 2.5 pack-year smoking history. He has never used smokeless tobacco. He reports that he uses illicit drugs (Marijuana). He reports that he does not drink alcohol.    Allergies  Allergen Reactions  .  Dilaudid [Hydromorphone Hcl] Other (See Comments)    Abnormal behavior, "verbal and physically abusive", "psychosis"  . Morphine And Related Itching    Syncopal episode    Family History  Problem Relation Age of Onset  . Heart  disease Mother     Heart Disease before age 81  . Deep vein thrombosis Father   . Heart attack Father   . Other Other         Prior to Admission medications   Medication Sig Start Date End Date Taking? Authorizing Provider  ALPRAZolam Duanne Moron) 1 MG tablet Take 1 mg by mouth at bedtime as needed for anxiety.   Yes Historical Provider, MD  amLODipine (NORVASC) 10 MG tablet Take 10 mg by mouth daily.   Yes Historical Provider, MD  aspirin EC 81 MG tablet Take 81 mg by mouth daily.   Yes Historical Provider, MD  buPROPion (WELLBUTRIN SR) 150 MG 12 hr tablet Take one tab by mouth every morning & two tabs every evening   Yes Historical Provider, MD  calcitRIOL (ROCALTROL) 0.5 MCG capsule Take 0.5 mcg by mouth daily.   Yes Historical Provider, MD  Cholecalciferol (VITAMIN D PO) Take 1 capsule by mouth daily.   Yes Historical Provider, MD  cinacalcet (SENSIPAR) 30 MG tablet Take 60 mg by mouth daily.    Yes Historical Provider, MD  cloNIDine (CATAPRES - DOSED IN MG/24 HR) 0.2 mg/24hr patch Place 1 patch onto the skin once a week.   Yes Historical Provider, MD  escitalopram (LEXAPRO) 20 MG tablet Take 20 mg by mouth daily.   Yes Historical Provider, MD  furosemide (LASIX) 80 MG tablet Take 80 mg by mouth 2 (two) times daily.   Yes Historical Provider, MD  hydrALAZINE (APRESOLINE) 100 MG tablet Take 100 mg by mouth 2 (two) times daily.   Yes Historical Provider, MD  labetalol (NORMODYNE) 100 MG tablet Take 150 mg by mouth 2 (two) times daily.    Yes Historical Provider, MD  omeprazole (PRILOSEC) 40 MG capsule Take 40 mg by mouth daily.   Yes Historical Provider, MD  oxyCODONE (OXY IR/ROXICODONE) 5 MG immediate release tablet Take 1 tablet (5 mg total) by mouth every 6 (six) hours as needed for severe pain. 11/09/15  Yes Silver Huguenin Elgergawy, MD  pravastatin (PRAVACHOL) 20 MG tablet Take 20 mg by mouth daily.   Yes Historical Provider, MD  saccharomyces boulardii (FLORASTOR) 250 MG capsule Take 1 capsule  (250 mg total) by mouth 2 (two) times daily. 11/09/15  Yes Albertine Patricia, MD  amoxicillin-clavulanate (AUGMENTIN) 500-125 MG tablet Take 1 tablet (500 mg total) by mouth daily. Patient not taking: Reported on 11/15/2015 11/09/15   Albertine Patricia, MD     Physical Exam: Filed Vitals:   11/15/15 1545 11/15/15 1600 11/15/15 1615 11/15/15 1630  BP: 144/68 139/70 148/69 140/69  Pulse: 86 87 89 87  Temp:      TempSrc:      Resp: 21 24 23 19   SpO2: 93% 93% 92% 92%      Constitutional: NAD, calm, comfortable Filed Vitals:   11/15/15 1545 11/15/15 1600 11/15/15 1615 11/15/15 1630  BP: 144/68 139/70 148/69 140/69  Pulse: 86 87 89 87  Temp:      TempSrc:      Resp: 21 24 23 19   SpO2: 93% 93% 92% 92%   Eyes: PERRL, lids and conjunctivae normal ENMT: Mucous membranes are moist. Posterior pharynx clear of any exudate or lesions.Normal dentition.  Neck:  normal, supple, no masses, no thyromegaly Respiratory: clear to auscultation bilaterally, no wheezing, no crackles. Normal respiratory effort. No accessory muscle use.  Cardiovascular: Regular rate and rhythm, no murmurs / rubs / gallops. No extremity edema. 2+ pedal pulses. No carotid bruits.  Abdomen: no tenderness, no masses palpated. No hepatosplenomegaly. Bowel sounds positive.  Musculoskeletal: no clubbing / cyanosis. No joint deformity upper and lower extremities. Good ROM, no contractures. Normal muscle tone.  Skin: no rashes, lesions, ulcers. No induration Neurologic: CN 2-12 grossly intact. Sensation intact, DTR normal. Strength 5/5 in all 4.  Psychiatric: Normal judgment and insight. Alert and oriented x 3. Normal mood.     Labs on Admission: I have personally reviewed following labs and imaging studies  CBC:  Recent Labs Lab 11/09/15 0348 11/15/15 1202  WBC 11.7* 27.2*  NEUTROABS  --  24.3*  HGB 7.5* 6.9*  HCT 23.2* 20.6*  MCV 78.6 80.5  PLT 466* 575*    Basic Metabolic Panel:  Recent Labs Lab  11/09/15 0348 11/15/15 1202  NA 138 138  K 3.4* 3.2*  CL 94* 96*  CO2 21* 24  GLUCOSE 87 96  BUN 94* 55*  CREATININE 10.86* 8.79*  CALCIUM 6.6* 7.8*    GFR: Estimated Creatinine Clearance: 13.1 mL/min (by C-G formula based on Cr of 8.79).  Liver Function Tests:  Recent Labs Lab 11/15/15 1202  AST 34  ALT 38  ALKPHOS 220*  BILITOT 0.8  PROT 5.3*  ALBUMIN 2.1*    Recent Labs Lab 11/15/15 1202  LIPASE 85*   No results for input(s): AMMONIA in the last 168 hours.  Coagulation Profile: No results for input(s): INR, PROTIME in the last 168 hours. No results for input(s): DDIMER in the last 72 hours.  Cardiac Enzymes:  Recent Labs Lab 11/15/15 1202  TROPONINI 0.05*    BNP (last 3 results) No results for input(s): PROBNP in the last 8760 hours.  HbA1C: No results for input(s): HGBA1C in the last 72 hours. Lab Results  Component Value Date   HGBA1C 5.4 06/20/2015   HGBA1C 9.9* 09/22/2012   HGBA1C * 12/22/2008    11.5 (NOTE) The ADA recommends the following therapeutic goal for glycemic control related to Hgb A1c measurement: Goal of therapy: <6.5 Hgb A1c  Reference: American Diabetes Association: Clinical Practice Recommendations 2010, Diabetes Care, 2010, 33: (Suppl  1).     CBG: No results for input(s): GLUCAP in the last 168 hours.  Lipid Profile: No results for input(s): CHOL, HDL, LDLCALC, TRIG, CHOLHDL, LDLDIRECT in the last 72 hours.  Thyroid Function Tests: No results for input(s): TSH, T4TOTAL, FREET4, T3FREE, THYROIDAB in the last 72 hours.  Anemia Panel:  Recent Labs  11/15/15 1342  VITAMINB12 712  FOLATE 3.4*  FERRITIN 1444*  TIBC 164*  IRON 7*  RETICCTPCT 2.4    Urine analysis:    Component Value Date/Time   COLORURINE AMBER* 11/04/2015 1930   APPEARANCEUR TURBID* 11/04/2015 1930   LABSPEC 1.019 11/04/2015 1930   PHURINE 5.5 11/04/2015 1930   GLUCOSEU 100* 11/04/2015 1930   HGBUR NEGATIVE 11/04/2015 1930   BILIRUBINUR  NEGATIVE 11/04/2015 1930   KETONESUR NEGATIVE 11/04/2015 1930   PROTEINUR >300* 11/04/2015 1930   UROBILINOGEN 0.2 09/22/2012 1805   NITRITE NEGATIVE 11/04/2015 1930   LEUKOCYTESUR NEGATIVE 11/04/2015 1930    Sepsis Labs:   )No results found for this or any previous visit (from the past 240 hour(s)).       Radiological Exams on Admission: Ct Abdomen  Pelvis Wo Contrast  11/15/2015  CLINICAL DATA:  Anemia and elevated white blood cell count following cholecystectomy on 11/06/2015. Generalized weakness. History of end-stage renal disease and right nephrectomy for cancer. EXAM: CT CHEST, ABDOMEN AND PELVIS WITHOUT CONTRAST TECHNIQUE: Multidetector CT imaging of the chest, abdomen and pelvis was performed following the standard protocol without IV contrast. COMPARISON:  CT abdomen and pelvis 11/05/2015. Chest radiographs 11/15/2015 and 11/04/2015. FINDINGS: CT CHEST No enlarged axillary, mediastinal, or hilar lymph nodes are identified. There is advanced aortic and coronary artery atherosclerosis. The heart is enlarged. There may be a trace right pleural effusion. Moderate volume right lower lobe consolidation is present with associated air bronchograms. Minimal dependent atelectasis is noted in the left lower lobe. Major airways are patent. No suspicious lytic or blastic osseous lesion is identified. Thoracic spondylosis is noted. CT ABDOMEN AND PELVIS Sequelae of interval cholecystectomy are identified. There is a hematoma extending from the region of the gallbladder fossa inferiorly along the right hepatic lobe and into the pericolic gutter. This hematoma measures approximately 12 x 6 x 8 cm. A surgical drain courses through the hematoma. There is mild mass effect on the inferior right hepatic lobe. Small volume blood products are also noted more anteriorly and laterally in the right lower quadrant as well as in the left upper quadrant. The spleen, right adrenal gland, and pancreas are unremarkable.  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 There is a nonobstructing calculus in the interpolar right kidney. No left hydronephrosis. Wall thickening involving the duodenum appears improved from the prior CT. Oral contrast is present in multiple loops of proximal small bowel, and there is a similar appearance of mild proximal small bowel dilatation up to 3.5 cm in diameter. Gas is present in more distal small bowel and colon. No evidence of appendicitis. Bladder and prostate are unremarkable. Diffuse atherosclerotic calcification is noted involving the abdominal aorta and its major branch vessels. No enlarged lymph nodes are identified. Disc bulging with annular calcification at L4-5 and L5-S1. IMPRESSION: 1. Interval cholecystectomy with 12 cm hematoma in the gallbladder fossa and about the inferior right hepatic lobe. The surgical drain courses through this hematoma. 2. Persistent mild small bowel dilatation, query ileus. 3. Right lower lobe consolidation consistent with pneumonia. These results will be called to the ordering clinician or representative by the Radiologist Assistant, and communication documented in the PACS or zVision Dashboard. Electronically Signed   By: Logan Bores M.D.   On: 11/15/2015 17:22   Dg Chest 2 View  11/15/2015  CLINICAL DATA:  Followup right lower lobe pneumonia EXAM: CHEST  2 VIEW COMPARISON:  11/04/2015 FINDINGS: Cardiac shadow is stable. The lungs are well aerated bilaterally. The previously seen right lower lobe pneumonia has nearly completely resolved. There is however a an underlying nodular appearing density which measures approximately 2.5 cm in greatest dimension. Some deformity of the minor fissure is noted in this is suspicious for an underlying mass lesion. CT of the chest is recommended for further evaluation. No acute bony abnormality is noted. No other focal abnormality is seen. IMPRESSION: Air complete resolution of previously seen right lower lobe  infiltrate. There is a a residual somewhat nodular appearing density identified. This may simply represent residual infiltrate although the possibility of an underlying lesion could not be totally excluded. CT of the chest with contrast is recommended for further evaluation. Electronically Signed   By: Inez Catalina M.D.   On: 11/15/2015 12:42   Ct Chest Wo Contrast  11/15/2015  CLINICAL DATA:  Anemia and elevated white blood cell count following cholecystectomy on 11/06/2015. Generalized weakness. History of end-stage renal disease and right nephrectomy for cancer. EXAM: CT CHEST, ABDOMEN AND PELVIS WITHOUT CONTRAST TECHNIQUE: Multidetector CT imaging of the chest, abdomen and pelvis was performed following the standard protocol without IV contrast. COMPARISON:  CT abdomen and pelvis 11/05/2015. Chest radiographs 11/15/2015 and 11/04/2015. FINDINGS: CT CHEST No enlarged axillary, mediastinal, or hilar lymph nodes are identified. There is advanced aortic and coronary artery atherosclerosis. The heart is enlarged. There may be a trace right pleural effusion. Moderate volume right lower lobe consolidation is present with associated air bronchograms. Minimal dependent atelectasis is noted in the left lower lobe. Major airways are patent. No suspicious lytic or blastic osseous lesion is identified. Thoracic spondylosis is noted. CT ABDOMEN AND PELVIS Sequelae of interval cholecystectomy are identified. There is a hematoma extending from the region of the gallbladder fossa inferiorly along the right hepatic lobe and into the pericolic gutter. This hematoma measures approximately 12 x 6 x 8 cm. A surgical drain courses through the hematoma. There is mild mass effect on the inferior right hepatic lobe. Small volume blood products are also noted more anteriorly and laterally in the right lower quadrant as well as in the left upper quadrant. The spleen, right adrenal gland, and pancreas are unremarkable. A 2.5 cm left  adrenal myelolipoma is unchanged. There is a nonobstructing calculus in the interpolar right kidney. No left hydronephrosis. Wall thickening involving the duodenum appears improved from the prior CT. Oral contrast is present in multiple loops of proximal small bowel, and there is a similar appearance of mild proximal small bowel dilatation up to 3.5 cm in diameter. Gas is present in more distal small bowel and colon. No evidence of appendicitis. Bladder and prostate are unremarkable. Diffuse atherosclerotic calcification is noted involving the abdominal aorta and its major branch vessels. No enlarged lymph nodes are identified. Disc bulging with annular calcification at L4-5 and L5-S1. IMPRESSION: 1. Interval cholecystectomy with 12 cm hematoma in the gallbladder fossa and about the inferior right hepatic lobe. The surgical drain courses through this hematoma. 2. Persistent mild small bowel dilatation, query ileus. 3. Right lower lobe consolidation consistent with pneumonia. These results will be called to the ordering clinician or representative by the Radiologist Assistant, and communication documented in the PACS or zVision Dashboard. Electronically Signed   By: Logan Bores M.D.   On: 11/15/2015 17:22   Ct Abdomen Pelvis Wo Contrast  11/15/2015  CLINICAL DATA:  Anemia and elevated white blood cell count following cholecystectomy on 11/06/2015. Generalized weakness. History of end-stage renal disease and right nephrectomy for cancer. EXAM: CT CHEST, ABDOMEN AND PELVIS WITHOUT CONTRAST TECHNIQUE: Multidetector CT imaging of the chest, abdomen and pelvis was performed following the standard protocol without IV contrast. COMPARISON:  CT abdomen and pelvis 11/05/2015. Chest radiographs 11/15/2015 and 11/04/2015. FINDINGS: CT CHEST No enlarged axillary, mediastinal, or hilar lymph nodes are identified. There is advanced aortic and coronary artery atherosclerosis. The heart is enlarged. There may be a trace right  pleural effusion. Moderate volume right lower lobe consolidation is present with associated air bronchograms. Minimal dependent atelectasis is noted in the left lower lobe. Major airways are patent. No suspicious lytic or blastic osseous lesion is identified. Thoracic spondylosis is noted. CT ABDOMEN AND PELVIS Sequelae of interval cholecystectomy are identified. There is a hematoma extending from the region of the gallbladder fossa inferiorly along the right hepatic lobe and  into the pericolic gutter. This hematoma measures approximately 12 x 6 x 8 cm. A surgical drain courses through the hematoma. There is mild mass effect on the inferior right hepatic lobe. Small volume blood products are also noted more anteriorly and laterally in the right lower quadrant as well as in the left upper quadrant. The spleen, right adrenal gland, and pancreas are unremarkable. 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 There is a nonobstructing calculus in the interpolar right kidney. No left hydronephrosis. Wall thickening involving the duodenum appears improved from the prior CT. Oral contrast is present in multiple loops of proximal small bowel, and there is a similar appearance of mild proximal small bowel dilatation up to 3.5 cm in diameter. Gas is present in more distal small bowel and colon. No evidence of appendicitis. Bladder and prostate are unremarkable. Diffuse atherosclerotic calcification is noted involving the abdominal aorta and its major branch vessels. No enlarged lymph nodes are identified. Disc bulging with annular calcification at L4-5 and L5-S1. IMPRESSION: 1. Interval cholecystectomy with 12 cm hematoma in the gallbladder fossa and about the inferior right hepatic lobe. The surgical drain courses through this hematoma. 2. Persistent mild small bowel dilatation, query ileus. 3. Right lower lobe consolidation consistent with pneumonia. These results will be called to the ordering clinician or representative  by the Radiologist Assistant, and communication documented in the PACS or zVision Dashboard. Electronically Signed   By: Logan Bores M.D.   On: 11/15/2015 17:22   Ct Abdomen Pelvis Wo Contrast  11/06/2015  CLINICAL DATA:  Right lower quadrant pain.  Fever EXAM: CT ABDOMEN AND PELVIS WITHOUT CONTRAST TECHNIQUE: Multidetector CT imaging of the abdomen and pelvis was performed following the standard protocol without IV contrast. COMPARISON:  CT 05/16/2014 FINDINGS: Lower chest: Right lower lobe infiltrate with air bronchograms. Probable pneumonia. Negative for pleural effusion. Left lung base clear. Hepatobiliary: Unenhanced images of the liver are negative. Markedly distended gallbladder with increased density in the gallbladder lumen suggesting sludge or hemorrhage or cholecystitis. There is mild edema surrounding the gallbladder neck consistent with acute inflammation. Bile ducts nondilated. Pancreas: Negative Spleen: Negative Adrenals/Urinary Tract: Negative for renal obstruction. 3 mm nonobstructing stone right lower pole. Fatty lesion left adrenal gland is unchanged may represent lipoma or myelo lipoma. Urinary bladder normal. Stomach/Bowel: Oral contrast is present in the stomach and small bowel extending into the colon and rectum. Small bowel mildly distended with air-fluid levels. This may represent ileus or low-grade obstruction. Normal appendix. There is prominent thickening of the duodenum in the second and third portions. There is stranding in the adjacent fat around the duodenum. This could represent inflammatory changes from peptic ulcer disease or duodenitis. Carcinoma cannot be excluded. Duodenal thickening is near the distended gallbladder however these may be two separate processes. Vascular/Lymphatic: Atherosclerotic calcification in the aorta and iliac arteries. No aneurysm. No lymphadenopathy. Reproductive: Negative prostate.  No bladder wall thickening. Other: No free-fluid Musculoskeletal:  Disc degeneration and spurring L4-5 and L5-S1. No fracture or bony mass lesion. IMPRESSION: Markedly distended gallbladder with diffusely increased density in the gallbladder. This could represent acute cholecystitis versus poor functioning of the gallbladder filled with sludge. Thickening of the second and third portions of the duodenum with surrounding edema in the adjacent fat. This may represent peptic ulcer disease or duodenitis. Carcinoma not excluded. Small bowel is mildly distended with air-fluid levels suggesting ileus or partial small obstruction. Oral contrast extends into the rectum Right lower lobe infiltrate, probable pneumonia. Normal appendix  Electronically Signed   By: Franchot Gallo M.D.   On: 11/06/2015 08:12   Dg Chest 2 View  11/15/2015  CLINICAL DATA:  Followup right lower lobe pneumonia EXAM: CHEST  2 VIEW COMPARISON:  11/04/2015 FINDINGS: Cardiac shadow is stable. The lungs are well aerated bilaterally. The previously seen right lower lobe pneumonia has nearly completely resolved. There is however a an underlying nodular appearing density which measures approximately 2.5 cm in greatest dimension. Some deformity of the minor fissure is noted in this is suspicious for an underlying mass lesion. CT of the chest is recommended for further evaluation. No acute bony abnormality is noted. No other focal abnormality is seen. IMPRESSION: Air complete resolution of previously seen right lower lobe infiltrate. There is a a residual somewhat nodular appearing density identified. This may simply represent residual infiltrate although the possibility of an underlying lesion could not be totally excluded. CT of the chest with contrast is recommended for further evaluation. Electronically Signed   By: Inez Catalina M.D.   On: 11/15/2015 12:42   Dg Chest 2 View  11/04/2015  CLINICAL DATA:  Pneumonia. EXAM: CHEST  2 VIEW COMPARISON:  06/20/2015 FINDINGS: Focal airspace disease in the right lower lobe which  is new from prior. No cavitation or effusion. Generous heart size without definitive cardiomegaly. Stable aortic and hilar contours. IMPRESSION: Right lower lobe pneumonia. Followup PA and lateral chest X-ray is recommended in 3-4 weeks following trial of antibiotic therapy to ensure resolution. Electronically Signed   By: Monte Fantasia M.D.   On: 11/04/2015 10:14   Dg Abd 1 View  11/04/2015  CLINICAL DATA:  Two day history of abdominal pain EXAM: ABDOMEN - 1 VIEW COMPARISON:  CT abdomen and pelvis May 16, 2014 FINDINGS: There is no appreciable bowel dilatation or air-fluid level suggesting obstruction. No free air is seen on this supine examination. There are vascular calcifications in the pelvis. IMPRESSION: No demonstrable bowel obstruction or free air. Electronically Signed   By: Lowella Grip III M.D.   On: 11/04/2015 10:13   Ct Chest Wo Contrast  11/15/2015  CLINICAL DATA:  Anemia and elevated white blood cell count following cholecystectomy on 11/06/2015. Generalized weakness. History of end-stage renal disease and right nephrectomy for cancer. EXAM: CT CHEST, ABDOMEN AND PELVIS WITHOUT CONTRAST TECHNIQUE: Multidetector CT imaging of the chest, abdomen and pelvis was performed following the standard protocol without IV contrast. COMPARISON:  CT abdomen and pelvis 11/05/2015. Chest radiographs 11/15/2015 and 11/04/2015. FINDINGS: CT CHEST No enlarged axillary, mediastinal, or hilar lymph nodes are identified. There is advanced aortic and coronary artery atherosclerosis. The heart is enlarged. There may be a trace right pleural effusion. Moderate volume right lower lobe consolidation is present with associated air bronchograms. Minimal dependent atelectasis is noted in the left lower lobe. Major airways are patent. No suspicious lytic or blastic osseous lesion is identified. Thoracic spondylosis is noted. CT ABDOMEN AND PELVIS Sequelae of interval cholecystectomy are identified. There is a  hematoma extending from the region of the gallbladder fossa inferiorly along the right hepatic lobe and into the pericolic gutter. This hematoma measures approximately 12 x 6 x 8 cm. A surgical drain courses through the hematoma. There is mild mass effect on the inferior right hepatic lobe. Small volume blood products are also noted more anteriorly and laterally in the right lower quadrant as well as in the left upper quadrant. The spleen, right adrenal gland, and pancreas are unremarkable. 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 There is a nonobstructing calculus in the interpolar right kidney. No left hydronephrosis. Wall thickening involving the duodenum appears improved from the prior CT. Oral contrast is present in multiple loops of proximal small bowel, and there is a similar appearance of mild proximal small bowel dilatation up to 3.5 cm in diameter. Gas is present in more distal small bowel and colon. No evidence of appendicitis. Bladder and prostate are unremarkable. Diffuse atherosclerotic calcification is noted involving the abdominal aorta and its major branch vessels. No enlarged lymph nodes are identified. Disc bulging with annular calcification at L4-5 and L5-S1. IMPRESSION: 1. Interval cholecystectomy with 12 cm hematoma in the gallbladder fossa and about the inferior right hepatic lobe. The surgical drain courses through this hematoma. 2. Persistent mild small bowel dilatation, query ileus. 3. Right lower lobe consolidation consistent with pneumonia. These results will be called to the ordering clinician or representative by the Radiologist Assistant, and communication documented in the PACS or zVision Dashboard. Electronically Signed   By: Logan Bores M.D.   On: 11/15/2015 17:22   US Abdomen Limited Ruq  11/06/2015  CLINICAL DATA:  Right upper quadrant pain for 4 days EXAM: US ABDOMEN LIMITED - RIGHT UPPER QUADRANT COMPARISON:  CT abdomen and pelvis Nov 05, 2015 FINDINGS: Gallbladder:  Gallbladder appears distended with wall thickening and wall edema. There is sludge in the gallbladder with small intermingled gallstones. The patient is focally tender over the gallbladder. There is equivocal pericholecystic fluid. Common bile duct: Diameter: 4 mm. There is no intrahepatic or extrahepatic biliary duct dilatation. Liver: No focal lesion identified. Within normal limits in parenchymal echogenicity. IMPRESSION: Findings as described above consistent with a degree of acute cholecystitis. These results will be called to the ordering clinician or representative by the Radiologist Assistant, and communication documented in the PACS or zVision Dashboard. Electronically Signed   By: Lowella Grip III M.D.   On: 11/06/2015 10:10      EKG: Independently reviewed.    Assessment/Plan Principal Problem:   Abdominal hematoma vs right upper quadrant abscess  Status post laparoscopic cholecystectomy by general surgery Dr. Dyann Kief on 11/06/2015, treated with total of 5 days of IV vancomycin during hospital stay,discharged home on  3  more days as an outpatient with Augmentin Now also has right lower lobe pneumonia Patient has a JP drain in place General surgery has been consulted Patient may need interventional radiology for further evaluation of his right upper quadrant hematoma to rule out abscesses in the setting of a white count of 27.2      Chronic systolic congestive heart failure/EF 40% PER echo 2010Stable    End stage renal disease (HCC)-Nephrology notified for initiating hemodialysis orders, discussed with Dr Regis Bill     CVA (cerebral infarction)-Currently on aspirin and statin    GERD (gastroesophageal reflux disease)-Continue PPI    Hypertension, well controlled     HCAP (healthcare-associated pneumonia)-Started on vancomycin and Zosyn    Acute blood loss anemia-Patient will be transfused 1 unit tonight and 1 unit tomorrow morning with hemodialysis,   Refused HD  tonight , says he still makes urine, doen not want to dialyse , made renal aware that patient on his second unit, will give lasix 40 mg iv tonight        DVT prophylaxis: scd's  CODE STATUS-full code     Family Communication: discussed with patient's .  By the bedside   Disposition Plan:  Anticipate discharge in 2-3 days pending further workup and clinical progress  Consults called: Nephrology, general surgery  Admission status: Inpatient  Total time spent 55 minutes.Greater than 50% of this time was spent in counseling, explanation of diagnosis, planning of further management, and coordination of care  [facility] MD [name] Pager [phone]  If 7PM-7AM, please contact night-coverage [email or web address] Password [facility]  11/15/2015, 6:07 PM

## 2015-11-15 NOTE — ED Provider Notes (Addendum)
CSN: [id number]     Arrival date & time 11/15/15  1123 History   First MD Initiated Contact with Patient 11/15/15 1134     Chief Complaint  Patient presents with  . Abnormal Lab  . Post-op Problem      The history is provided by the patient and medical records.   Patient is 12 days postoperative from a cholecystectomy after the patient was noted to have gangrenous cholecystitis.  He presents with a Jackson-Pratt drain in his right upper quadrant.  He presents to the emergency department today because of increasing generalized weakness.  He states he has no energy and was seen yesterday in the general surgery office was told that his labs were abnormal and it was recommended that he come to the ER for evaluation.  He reports since the surgery has abdominal pain has been improving.  He still has mild discomfort at this time.  He states his pain has not worsened.  He continues to have a small amount of output from the Jackson-Pratt drain.  There is a small amount of blood in there.  He is not on anticoagulants.  He denies fevers and chills.  Reports poor appetite but has been keeping fluids down.  Denies nausea vomiting.  He reports he has some ongoing diarrhea but reports easy to control at this time.  Denies blood in the stool.  Denies lower abdominal pain.  No chest pain or shortness breath.  No productive cough.  Symptoms are moderate in severity.  Denies orthopnea.  No active chest pain   Past Medical History  Diagnosis Date  . History of kidney cancer   . Hypertension   . CHF (congestive heart failure) ([place]) [id number]    Acute systolic and diastolic CHF  . Renal disorder   . Obesity   . Hyperlipidemia   . Anemia March 2014  . Cancer ([place])     Kidney  . PONV (postoperative nausea and vomiting)   . Depression     & rage --  was in counseling....great now  . Diabetes mellitus     NO DM SINCE LOST 130LBS  . Stroke [facility])    Past Surgical History  Procedure Laterality Date  . Nephrectomy  Right 2008    partial  . Testicle torsion reduction    . Hernia repair    . Tonsillectomy and adenoidectomy    . Av fistula placement Left 01/26/2013    Procedure: ARTERIOVENOUS (AV) FISTULA CREATION - LEFT RADIAL CEPHALIC AVF;  Surgeon: Angelia Mould, MD;  Location: [place];  Service: Vascular;  Laterality: Left;  . Cholecystectomy  11/06/2015    Procedure: LAPAROSCOPIC CHOLECYSTECTOMY;  Surgeon: Ralene Ok, MD;  Location: [facility] OR;  Service: General;;   Family History  Problem Relation Age of Onset  . Heart disease Mother     Heart Disease before age 2  . Deep vein thrombosis Father   . Heart attack Father   . Other Other    Social History  Substance Use Topics  . Smoking status: Current Every Day Smoker -- 0.25 packs/day for 10 years    Types: Cigarettes  . Smokeless tobacco: Never Used  . Alcohol Use: No     Comment: "6 pack occ."-- lasts him six months-STOPPED 2 YRS AGO    Review of Systems  All other systems reviewed and are negative.     Allergies  Dilaudid and Morphine and related  Home Medications   Prior to Admission medications   Medication  Sig Start Date End Date Taking? Authorizing Provider  ALPRAZolam Duanne Moron) 1 MG tablet Take 1 mg by mouth at bedtime as needed for anxiety.    Historical Provider, MD  amLODipine (NORVASC) 10 MG tablet Take 10 mg by mouth daily.    Historical Provider, MD  amoxicillin-clavulanate (AUGMENTIN) 500-125 MG tablet Take 1 tablet (500 mg total) by mouth daily. 11/09/15   Albertine Patricia, MD  aspirin EC 81 MG tablet Take 81 mg by mouth daily.    Historical Provider, MD  buPROPion (WELLBUTRIN SR) 150 MG 12 hr tablet Take one tab by mouth every morning & two tabs every evening    Historical Provider, MD  calcitRIOL (ROCALTROL) 0.5 MCG capsule Take 0.5 mcg by mouth daily.    Historical Provider, MD  Cholecalciferol (VITAMIN D PO) Take 1 capsule by mouth daily.    Historical Provider, MD  cinacalcet (SENSIPAR) 30 MG tablet Take  60 mg by mouth daily.     Historical Provider, MD  cloNIDine (CATAPRES - DOSED IN MG/24 HR) 0.2 mg/24hr patch Place 1 patch onto the skin once a week.    Historical Provider, MD  escitalopram (LEXAPRO) 20 MG tablet Take 20 mg by mouth daily.    Historical Provider, MD  furosemide (LASIX) 80 MG tablet Take 80 mg by mouth 2 (two) times daily.    Historical Provider, MD  hydrALAZINE (APRESOLINE) 100 MG tablet Take 100 mg by mouth 2 (two) times daily.    Historical Provider, MD  labetalol (NORMODYNE) 100 MG tablet Take 150 mg by mouth 2 (two) times daily.     Historical Provider, MD  omeprazole (PRILOSEC) 40 MG capsule Take 40 mg by mouth daily.    Historical Provider, MD  oxyCODONE (OXY IR/ROXICODONE) 5 MG immediate release tablet Take 1 tablet (5 mg total) by mouth every 6 (six) hours as needed for severe pain. 11/09/15   Silver Huguenin Elgergawy, MD  pravastatin (PRAVACHOL) 20 MG tablet Take 20 mg by mouth daily.    Historical Provider, MD  saccharomyces boulardii (FLORASTOR) 250 MG capsule Take 1 capsule (250 mg total) by mouth 2 (two) times daily. 11/09/15   Silver Huguenin Elgergawy, MD   BP 119/67 mmHg  Pulse 90  Temp(Src) 98.3 F (36.8 C) (Oral)  Resp 20  SpO2 95% Physical Exam  Constitutional: He is oriented to person, place, and time. He appears well-developed and well-nourished.  HENT:  Head: Normocephalic and atraumatic.  Eyes: EOM are normal.  Neck: Normal range of motion.  Cardiovascular: Normal rate, regular rhythm, normal heart sounds and intact distal pulses.   Pulmonary/Chest: Effort normal and breath sounds normal. No respiratory distress.  Abdominal: Soft. He exhibits no distension.  Jackson-Pratt drain in right upper quadrant without surrounding signs of infection.  Mild upper abdominal tenderness without guarding or rebound.  Laparoscopic surgical incisions without secondary signs of infection.  Small amount of blood noted in the Jackson-Pratt drain  Musculoskeletal: Normal range of  motion.  Neurological: He is alert and oriented to person, place, and time.  Skin: Skin is warm and dry.  Psychiatric: He has a normal mood and affect. Judgment normal.  Nursing note and vitals reviewed.   ED Course  Procedures (including critical care time)   ++++++++++++++++++++++++++++++++++++++++++++++++++++++++++++++++++++   CRITICAL CARE Performed by: Hoy Morn Total critical care time: 32 minutes Critical care time was exclusive of separately billable procedures and treating other patients. Critical care was necessary to treat or prevent imminent or life-threatening deterioration. Critical care was  time spent personally by me on the following activities: development of treatment plan with patient and/or surrogate as well as nursing, discussions with consultants, evaluation of patient's response to treatment, examination of patient, obtaining history from patient or surrogate, ordering and performing treatments and interventions, ordering and review of laboratory studies, ordering and review of radiographic studies, pulse oximetry and re-evaluation of patient's condition.   +++++++++++++++++++++++++++++++++++++++++++++++++++++++++++++++++++    Labs Review Labs Reviewed  CBC WITH DIFFERENTIAL/PLATELET - Abnormal; Notable for the following:    WBC 27.2 (*)    RBC 2.56 (*)    Hemoglobin 6.9 (*)    HCT 20.6 (*)    MCH 25.8 (*)    RDW 17.8 (*)    Platelets 575 (*)    Neutro Abs 24.3 (*)    Monocytes Absolute 1.6 (*)    All other components within normal limits  COMPREHENSIVE METABOLIC PANEL - Abnormal; Notable for the following:    Potassium 3.2 (*)    Chloride 96 (*)    BUN 55 (*)    Creatinine, Ser 8.79 (*)    Calcium 7.8 (*)    Total Protein 5.3 (*)    Albumin 2.1 (*)    Alkaline Phosphatase 220 (*)    GFR calc non Af Amer 7 (*)    GFR calc Af Amer 8 (*)    Anion gap 18 (*)    All other components within normal limits  LIPASE, BLOOD - Abnormal; Notable for  the following:    Lipase 85 (*)    All other components within normal limits  TROPONIN I - Abnormal; Notable for the following:    Troponin I 0.05 (*)    All other components within normal limits  FOLATE - Abnormal; Notable for the following:    Folate 3.4 (*)    All other components within normal limits  IRON AND TIBC - Abnormal; Notable for the following:    Iron 7 (*)    TIBC 164 (*)    Saturation Ratios 4 (*)    All other components within normal limits  FERRITIN - Abnormal; Notable for the following:    Ferritin 1444 (*)    All other components within normal limits  RETICULOCYTES - Abnormal; Notable for the following:    RBC. 2.63 (*)    All other components within normal limits  VITAMIN B12  TYPE AND SCREEN  PREPARE RBC (CROSSMATCH)      HEMOGLOBIN  Date Value Ref Range Status  11/15/2015 6.9* 13.0 - 17.0 g/dL Final    Comment:    REPEATED TO VERIFY CRITICAL RESULT CALLED TO, READ BACK BY AND VERIFIED WITH: NICOLE STEPHENS,RN AT 1226 11/15/15 BY K BARR   11/09/2015 7.5* 13.0 - 17.0 g/dL Final  11/08/2015 7.5* 13.0 - 17.0 g/dL Final  11/07/2015 8.0* 13.0 - 17.0 g/dL Final       Imaging Review Ct Abdomen Pelvis Wo Contrast  11/15/2015  CLINICAL DATA:  Anemia and elevated white blood cell count following cholecystectomy on 11/06/2015. Generalized weakness. History of end-stage renal disease and right nephrectomy for cancer. EXAM: CT CHEST, ABDOMEN AND PELVIS WITHOUT CONTRAST TECHNIQUE: Multidetector CT imaging of the chest, abdomen and pelvis was performed following the standard protocol without IV contrast. COMPARISON:  CT abdomen and pelvis 11/05/2015. Chest radiographs 11/15/2015 and 11/04/2015. FINDINGS: CT CHEST No enlarged axillary, mediastinal, or hilar lymph nodes are identified. There is advanced aortic and coronary artery atherosclerosis. The heart is enlarged. There may be a trace right pleural effusion.  Moderate volume right lower lobe consolidation is  present with associated air bronchograms. Minimal dependent atelectasis is noted in the left lower lobe. Major airways are patent. No suspicious lytic or blastic osseous lesion is identified. Thoracic spondylosis is noted. CT ABDOMEN AND PELVIS Sequelae of interval cholecystectomy are identified. There is a hematoma extending from the region of the gallbladder fossa inferiorly along the right hepatic lobe and into the pericolic gutter. This hematoma measures approximately 12 x 6 x 8 cm. A surgical drain courses through the hematoma. There is mild mass effect on the inferior right hepatic lobe. Small volume blood products are also noted more anteriorly and laterally in the right lower quadrant as well as in the left upper quadrant. The spleen, right adrenal gland, and pancreas are unremarkable. 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 There is a nonobstructing calculus in the interpolar right kidney. No left hydronephrosis. Wall thickening involving the duodenum appears improved from the prior CT. Oral contrast is present in multiple loops of proximal small bowel, and there is a similar appearance of mild proximal small bowel dilatation up to 3.5 cm in diameter. Gas is present in more distal small bowel and colon. No evidence of appendicitis. Bladder and prostate are unremarkable. Diffuse atherosclerotic calcification is noted involving the abdominal aorta and its major branch vessels. No enlarged lymph nodes are identified. Disc bulging with annular calcification at L4-5 and L5-S1. IMPRESSION: 1. Interval cholecystectomy with 12 cm hematoma in the gallbladder fossa and about the inferior right hepatic lobe. The surgical drain courses through this hematoma. 2. Persistent mild small bowel dilatation, query ileus. 3. Right lower lobe consolidation consistent with pneumonia. These results will be called to the ordering clinician or representative by the Radiologist Assistant, and communication documented in the  PACS or zVision Dashboard. Electronically Signed   By: Logan Bores M.D.   On: 11/15/2015 17:22   Dg Chest 2 View  11/15/2015  CLINICAL DATA:  Followup right lower lobe pneumonia EXAM: CHEST  2 VIEW COMPARISON:  11/04/2015 FINDINGS: Cardiac shadow is stable. The lungs are well aerated bilaterally. The previously seen right lower lobe pneumonia has nearly completely resolved. There is however a an underlying nodular appearing density which measures approximately 2.5 cm in greatest dimension. Some deformity of the minor fissure is noted in this is suspicious for an underlying mass lesion. CT of the chest is recommended for further evaluation. No acute bony abnormality is noted. No other focal abnormality is seen. IMPRESSION: Air complete resolution of previously seen right lower lobe infiltrate. There is a a residual somewhat nodular appearing density identified. This may simply represent residual infiltrate although the possibility of an underlying lesion could not be totally excluded. CT of the chest with contrast is recommended for further evaluation. Electronically Signed   By: Inez Catalina M.D.   On: 11/15/2015 12:42   Ct Chest Wo Contrast  11/15/2015  CLINICAL DATA:  Anemia and elevated white blood cell count following cholecystectomy on 11/06/2015. Generalized weakness. History of end-stage renal disease and right nephrectomy for cancer. EXAM: CT CHEST, ABDOMEN AND PELVIS WITHOUT CONTRAST TECHNIQUE: Multidetector CT imaging of the chest, abdomen and pelvis was performed following the standard protocol without IV contrast. COMPARISON:  CT abdomen and pelvis 11/05/2015. Chest radiographs 11/15/2015 and 11/04/2015. FINDINGS: CT CHEST No enlarged axillary, mediastinal, or hilar lymph nodes are identified. There is advanced aortic and coronary artery atherosclerosis. The heart is enlarged. There may be a trace right pleural effusion. Moderate volume right  lower lobe consolidation is present with associated air  bronchograms. Minimal dependent atelectasis is noted in the left lower lobe. Major airways are patent. No suspicious lytic or blastic osseous lesion is identified. Thoracic spondylosis is noted. CT ABDOMEN AND PELVIS Sequelae of interval cholecystectomy are identified. There is a hematoma extending from the region of the gallbladder fossa inferiorly along the right hepatic lobe and into the pericolic gutter. This hematoma measures approximately 12 x 6 x 8 cm. A surgical drain courses through the hematoma. There is mild mass effect on the inferior right hepatic lobe. Small volume blood products are also noted more anteriorly and laterally in the right lower quadrant as well as in the left upper quadrant. The spleen, right adrenal gland, and pancreas are unremarkable. 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 There is a nonobstructing calculus in the interpolar right kidney. No left hydronephrosis. Wall thickening involving the duodenum appears improved from the prior CT. Oral contrast is present in multiple loops of proximal small bowel, and there is a similar appearance of mild proximal small bowel dilatation up to 3.5 cm in diameter. Gas is present in more distal small bowel and colon. No evidence of appendicitis. Bladder and prostate are unremarkable. Diffuse atherosclerotic calcification is noted involving the abdominal aorta and its major branch vessels. No enlarged lymph nodes are identified. Disc bulging with annular calcification at L4-5 and L5-S1. IMPRESSION: 1. Interval cholecystectomy with 12 cm hematoma in the gallbladder fossa and about the inferior right hepatic lobe. The surgical drain courses through this hematoma. 2. Persistent mild small bowel dilatation, query ileus. 3. Right lower lobe consolidation consistent with pneumonia. These results will be called to the ordering clinician or representative by the Radiologist Assistant, and communication documented in the PACS or zVision Dashboard.  Electronically Signed   By: Logan Bores M.D.   On: 11/15/2015 17:22   I have personally reviewed and evaluated these images and lab results as part of my medical decision-making.   EKG Interpretation   Date/Time:  Wednesday Nov 15 2015 12:45:35 EDT Ventricular Rate:  88 PR Interval:  172 QRS Duration: 112 QT Interval:  406 QTC Calculation: 491 R Axis:   -29 Text Interpretation:  Sinus rhythm Abnormal R-wave progression, late  transition LVH with IVCD and secondary repol abnrm Anterior ST elevation,  probably due to LVH Borderline prolonged QT interval Baseline wander in  lead(s) V2 No significant change was found Confirmed by Tobi [name]  MD, Irena Gaydos  ([id number]) on 11/15/2015 12:56:48 PM      MDM   Final diagnoses:  Weakness  Anemia, unspecified anemia type    Patient is symptomatically anemia.  Blood transfusion now.  General surgery consultation given recent operative repair.  His hemoglobin is slowly drifted down over the past 5-6 days which I believe accounts for his symptoms.  General surgery is seen and evaluated the patient and recommends hospitalist admission given his multiple comorbidities and anemia.  They agree with blood transfusion at this time.  Given his elevated white blood cell count they do want to obtain a CT scan through the abdomen and pelvis  5:33 PM Pt with RLL PNA. vanc and zosyn to be added for HCAP coverage. Hospitalist admission      Jola Schmidt, MD 11/15/15 [place], MD 11/20/15 [phone]

## 2015-11-15 NOTE — Consult Note (Signed)
[facility] Consult Note     Subjective: 45 y/o white male with CKD requiring home HD, HTN, h/o CHF, HLD, h/o kidney cancer s/p right partial nephrectomy presents to [facility] s/p lap chole for gangrenous cholecystitis on 11/06/2015 with fatigue, weakness, and acute blood loss on chronic anemia.  He saw Dr. Rosendo Gros in the office yesterday and he wanted him to continue his JP drain which is still putting out sanguinous drainage.  He c/o some flank and left sided abdominal pain.  No N/V, tolerating some food well, but has anorexia.  He says he still makes urine and that has been normal.  Having BM's and flatus.  Objective: Vital signs in last 24 hours: Temp:  [98.3 F (36.8 C)] 98.3 F (36.8 C) (05/17 1139) Pulse Rate:  [85-90] 86 (05/17 1400) Resp:  [20-25] 20 (05/17 1400) BP: (119-143)/(54-67) 130/62 mmHg (05/17 1400) SpO2:  [92 %-96 %] 93 % (05/17 1400)    Intake/Output from previous day:   Intake/Output this shift:    PE: Gen:  Alert, NAD, pleasant Card:  RRR, no M/G/R heard Pulm:  CTA, no W/R/R, good effort Abd: Soft, distended, tender on right flank/right lateral abdomen, ecchymosis starting to form on right flank, right lateral abdomen with a bulge (likely a hematoma), +BS, no HSM, incisions C/D/I, drain with minimal dark red sanguinous drainage, bandage was completely saturated with dried blood Ext:  No erythema, edema, or tenderness, no calf tenderness  Lab Results:   Recent Labs  11/15/15 1202  WBC 27.2*  HGB 6.9*  HCT 20.6*  PLT 575*   BMET  Recent Labs  11/15/15 1202  NA 138  K 3.2*  CL 96*  CO2 24  GLUCOSE 96  BUN 55*  CREATININE 8.79*  CALCIUM 7.8*   PT/INR No results for input(s): LABPROT, INR in the last 72 hours. CMP     Component Value Date/Time   NA 138 11/15/2015 1202   K 3.2* 11/15/2015 1202   CL 96* 11/15/2015 1202   CO2 24 11/15/2015 1202   GLUCOSE 96 11/15/2015 1202   BUN 55* 11/15/2015 1202   CREATININE 8.79* 11/15/2015  1202   CALCIUM 7.8* 11/15/2015 1202   PROT 5.3* 11/15/2015 1202   ALBUMIN 2.1* 11/15/2015 1202   AST 34 11/15/2015 1202   ALT 38 11/15/2015 1202   ALKPHOS 220* 11/15/2015 1202   BILITOT 0.8 11/15/2015 1202   GFRNONAA 7* 11/15/2015 1202   GFRAA 8* 11/15/2015 1202   Lipase     Component Value Date/Time   LIPASE 85* 11/15/2015 1202       Studies/Results: Dg Chest 2 View  11/15/2015  CLINICAL DATA:  Followup right lower lobe pneumonia EXAM: CHEST  2 VIEW COMPARISON:  11/04/2015 FINDINGS: Cardiac shadow is stable. The lungs are well aerated bilaterally. The previously seen right lower lobe pneumonia has nearly completely resolved. There is however a an underlying nodular appearing density which measures approximately 2.5 cm in greatest dimension. Some deformity of the minor fissure is noted in this is suspicious for an underlying mass lesion. CT of the chest is recommended for further evaluation. No acute bony abnormality is noted. No other focal abnormality is seen. IMPRESSION: Air complete resolution of previously seen right lower lobe infiltrate. There is a a residual somewhat nodular appearing density identified. This may simply represent residual infiltrate although the possibility of an underlying lesion could not be totally excluded. CT of the chest with contrast is recommended for further evaluation. Electronically Signed  By: Inez Catalina M.D.   On: 11/15/2015 12:42    Anti-infectives: Anti-infectives    None       Assessment/Plan Fatigue/weakness/anorexia Symptomatic ABL Anemia on chronic disease anemia - Hgb 6.9 flank hematoma? Pending 2 units pRBC's Leukocytosis - 27.2, don't think this is due to abdominal etiology, but will get CT to rule it out S/p lap chole for gangrenous cholecystitis 11/06/15 -Was just seen by Dr. Rosendo Gros yesterday in the office for a post-op check -Admit to medicine, does not appear to need a re-operation or surgical intervention -CT abdomen to rule  out intra-abdominal hematoma, but more likely soft tissue flank hematoma from laparoscopic port sites -Abdomen is pretty benign, only tenderness at right flank, afebrile, LFT's improving.  If only a hematoma this will resolve on its own -Abdominal binder and ice packs to right abd/flank Pneumonia last admission - 2.5cm nodule which is suspicious for residual infiltrate vs mass, CT is reasonable since he's going down for CT abdomen ESRD on home HD - Cr. 8.79 FEN - resume renal diet after CT scan  Disp - unknown      Nat Christen 11/15/2015, 2:06 PM Pager: [phone]  (7am - 4:30pm M-F; 7am - 11:30am Sa/Su)

## 2015-11-15 NOTE — ED Notes (Signed)
Pt to CT at this time.

## 2015-11-16 LAB — COMPREHENSIVE METABOLIC PANEL
ALBUMIN: 2 g/dL — AB (ref 3.5–5.0)
ALK PHOS: 236 U/L — AB (ref 38–126)
ALT: 43 U/L (ref 17–63)
ANION GAP: 18 — AB (ref 5–15)
AST: 36 U/L (ref 15–41)
BUN: 61 mg/dL — ABNORMAL HIGH (ref 6–20)
CALCIUM: 7.3 mg/dL — AB (ref 8.9–10.3)
CO2: 23 mmol/L (ref 22–32)
Chloride: 98 mmol/L — ABNORMAL LOW (ref 101–111)
Creatinine, Ser: 9.47 mg/dL — ABNORMAL HIGH (ref 0.61–1.24)
GFR calc non Af Amer: 6 mL/min — ABNORMAL LOW (ref >60)
GFR, EST AFRICAN AMERICAN: 7 mL/min — AB (ref >60)
GLUCOSE: 79 mg/dL (ref 65–99)
POTASSIUM: 3.3 mmol/L — AB (ref 3.5–5.1)
SODIUM: 139 mmol/L (ref 135–145)
TOTAL PROTEIN: 5.6 g/dL — AB (ref 6.5–8.1)
Total Bilirubin: 0.9 mg/dL (ref 0.3–1.2)

## 2015-11-16 LAB — TROPONIN I: TROPONIN I: 0.1 ng/mL — AB (ref <0.031)

## 2015-11-16 LAB — CBC
HEMATOCRIT: 25.6 % — AB (ref 39.0–52.0)
HEMATOCRIT: 25.1 % — AB (ref 39.0–52.0)
HEMOGLOBIN: 8.3 g/dL — AB (ref 13.0–17.0)
HEMOGLOBIN: 8.2 g/dL — AB (ref 13.0–17.0)
MCH: 26.1 pg (ref 26.0–34.0)
MCH: 26.7 pg (ref 26.0–34.0)
MCHC: 32.4 g/dL (ref 30.0–36.0)
MCHC: 32.7 g/dL (ref 30.0–36.0)
MCV: 80.5 fL (ref 78.0–100.0)
MCV: 81.8 fL (ref 78.0–100.0)
Platelets: 637 10*3/uL — ABNORMAL HIGH (ref 150–400)
Platelets: 603 10*3/uL — ABNORMAL HIGH (ref 150–400)
RBC: 3.18 MIL/uL — AB (ref 4.22–5.81)
RBC: 3.07 MIL/uL — AB (ref 4.22–5.81)
RDW: 16.8 % — ABNORMAL HIGH (ref 11.5–15.5)
RDW: 17 % — ABNORMAL HIGH (ref 11.5–15.5)
WBC: 17.5 10*3/uL — ABNORMAL HIGH (ref 4.0–10.5)
WBC: 16.4 10*3/uL — ABNORMAL HIGH (ref 4.0–10.5)

## 2015-11-16 LAB — RENAL FUNCTION PANEL
ALBUMIN: 2.1 g/dL — AB (ref 3.5–5.0)
ANION GAP: 19 — AB (ref 5–15)
BUN: 65 mg/dL — ABNORMAL HIGH (ref 6–20)
CO2: 23 mmol/L (ref 22–32)
Calcium: 7.4 mg/dL — ABNORMAL LOW (ref 8.9–10.3)
Chloride: 98 mmol/L — ABNORMAL LOW (ref 101–111)
Creatinine, Ser: 9.65 mg/dL — ABNORMAL HIGH (ref 0.61–1.24)
GFR calc non Af Amer: 6 mL/min — ABNORMAL LOW (ref >60)
GFR, EST AFRICAN AMERICAN: 7 mL/min — AB (ref >60)
GLUCOSE: 90 mg/dL (ref 65–99)
PHOSPHORUS: 10 mg/dL — AB (ref 2.5–4.6)
POTASSIUM: 3.6 mmol/L (ref 3.5–5.1)
Sodium: 140 mmol/L (ref 135–145)

## 2015-11-16 MED ORDER — ALPRAZOLAM 0.5 MG PO TABS
1.0000 mg | ORAL_TABLET | Freq: Two times a day (BID) | ORAL | Status: DC | PRN
  Administered 2015-11-16 – 2015-11-20 (×5): 1 mg via ORAL
  Filled 2015-11-16 (×5): qty 2

## 2015-11-16 MED ORDER — FUROSEMIDE 10 MG/ML IJ SOLN
40.0000 mg | Freq: Two times a day (BID) | INTRAMUSCULAR | Status: DC
  Administered 2015-11-16 – 2015-11-18 (×4): 40 mg via INTRAVENOUS
  Filled 2015-11-16 (×4): qty 4

## 2015-11-16 MED ORDER — ALTEPLASE 2 MG IJ SOLR: 2.0000 mg | Freq: Once | INTRAMUSCULAR | Status: DC | PRN

## 2015-11-16 MED ORDER — SODIUM CHLORIDE 0.9 % IV SOLN: 100.0000 mL | INTRAVENOUS | Status: DC | PRN

## 2015-11-16 MED ORDER — HYDRALAZINE HCL 20 MG/ML IJ SOLN
10.0000 mg | Freq: Four times a day (QID) | INTRAMUSCULAR | Status: DC | PRN
  Administered 2015-11-16 – 2015-11-17 (×3): 10 mg via INTRAVENOUS
  Filled 2015-11-16 (×3): qty 1

## 2015-11-16 MED ORDER — LIDOCAINE-PRILOCAINE 2.5-2.5 % EX CREA: 1.0000 "application " | TOPICAL_CREAM | CUTANEOUS | Status: DC | PRN

## 2015-11-16 MED ORDER — HEPARIN SODIUM (PORCINE) 1000 UNIT/ML DIALYSIS: 1000.0000 [IU] | INTRAMUSCULAR | Status: DC | PRN

## 2015-11-16 MED ORDER — PENTAFLUOROPROP-TETRAFLUOROETH EX AERO: 1.0000 "application " | INHALATION_SPRAY | CUTANEOUS | Status: DC | PRN

## 2015-11-16 MED ORDER — LIDOCAINE HCL (PF) 1 % IJ SOLN: 5.0000 mL | INTRAMUSCULAR | Status: DC | PRN

## 2015-11-16 NOTE — Progress Notes (Signed)
Subjective: 45 yo M s/p lap chole readmitted with post operative intra-abdominal hematoma.   CT yesterday in ER noted to have RLL pneumonia and post op hematoma. NAE overnight. Received 2u PRBCs yesterday.  States he isn't scheduled for HD until Friday.   Denies any abdominal pain or shortness of breath.   Objective: Vital signs in last 24 hours: Temp:  [98.2 F (36.8 C)-98.8 F (37.1 C)] 98.3 F (36.8 C) (05/18 0400) Pulse Rate:  [85-102] 98 (05/18 0400) Resp:  [17-30] 30 (05/18 0400) BP: (119-176)/(54-84) 176/78 mmHg (05/18 0400) SpO2:  [78 %-96 %] 94 % (05/18 0400) Weight:  [101.7 kg (224 lb 3.3 oz)] 101.7 kg (224 lb 3.3 oz) (05/17 2038)    Intake/Output from previous day: 05/17 0701 - 05/18 0700 In: [id number] [I.V.:1000; Blood:335; IV Piggyback:50] Out: 550 [Urine:550] Intake/Output this shift:    General appearance: alert, cooperative, appears stated age, no distress and mildly obese GI: Soft, nondistended.  Non tender.  Incisions healing well with steri strips in place.  no evidence of infection.  JP in RUQ with old blood and clot in tubing and bulb.  Lab Results:   Recent Labs  11/15/15 1202 11/16/15 0239  WBC 27.2* 17.5*  HGB 6.9* 8.3*  HCT 20.6* 25.6*  PLT 575* 637*   BMET  Recent Labs  11/15/15 1202 11/16/15 0239  NA 138 139  K 3.2* 3.3*  CL 96* 98*  CO2 24 23  GLUCOSE 96 79  BUN 55* 61*  CREATININE 8.79* 9.47*  CALCIUM 7.8* 7.3*   PT/INR No results for input(s): LABPROT, INR in the last 72 hours. ABG No results for input(s): PHART, HCO3 in the last 72 hours.  Invalid input(s): PCO2, PO2  Studies/Results: Ct Abdomen Pelvis Wo Contrast  11/15/2015  CLINICAL DATA:  Anemia and elevated white blood cell count following cholecystectomy on 11/06/2015. Generalized weakness. History of end-stage renal disease and right nephrectomy for cancer. EXAM: CT CHEST, ABDOMEN AND PELVIS WITHOUT CONTRAST TECHNIQUE: Multidetector CT imaging of the chest, abdomen  and pelvis was performed following the standard protocol without IV contrast. COMPARISON:  CT abdomen and pelvis 11/05/2015. Chest radiographs 11/15/2015 and 11/04/2015. FINDINGS: CT CHEST No enlarged axillary, mediastinal, or hilar lymph nodes are identified. There is advanced aortic and coronary artery atherosclerosis. The heart is enlarged. There may be a trace right pleural effusion. Moderate volume right lower lobe consolidation is present with associated air bronchograms. Minimal dependent atelectasis is noted in the left lower lobe. Major airways are patent. No suspicious lytic or blastic osseous lesion is identified. Thoracic spondylosis is noted. CT ABDOMEN AND PELVIS Sequelae of interval cholecystectomy are identified. There is a hematoma extending from the region of the gallbladder fossa inferiorly along the right hepatic lobe and into the pericolic gutter. This hematoma measures approximately 12 x 6 x 8 cm. A surgical drain courses through the hematoma. There is mild mass effect on the inferior right hepatic lobe. Small volume blood products are also noted more anteriorly and laterally in the right lower quadrant as well as in the left upper quadrant. The spleen, right adrenal gland, and pancreas are unremarkable. 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 There is a nonobstructing calculus in the interpolar right kidney. No left hydronephrosis. Wall thickening involving the duodenum appears improved from the prior CT. Oral contrast is present in multiple loops of proximal small bowel, and there is a similar appearance of mild proximal small bowel dilatation up to 3.5 cm in diameter. Gas is  present in more distal small bowel and colon. No evidence of appendicitis. Bladder and prostate are unremarkable. Diffuse atherosclerotic calcification is noted involving the abdominal aorta and its major branch vessels. No enlarged lymph nodes are identified. Disc bulging with annular calcification at L4-5 and  L5-S1. IMPRESSION: 1. Interval cholecystectomy with 12 cm hematoma in the gallbladder fossa and about the inferior right hepatic lobe. The surgical drain courses through this hematoma. 2. Persistent mild small bowel dilatation, query ileus. 3. Right lower lobe consolidation consistent with pneumonia. These results will be called to the ordering clinician or representative by the Radiologist Assistant, and communication documented in the PACS or zVision Dashboard. Electronically Signed   By: Logan Bores M.D.   On: 11/15/2015 17:22   Dg Chest 2 View  11/15/2015  CLINICAL DATA:  Followup right lower lobe pneumonia EXAM: CHEST  2 VIEW COMPARISON:  11/04/2015 FINDINGS: Cardiac shadow is stable. The lungs are well aerated bilaterally. The previously seen right lower lobe pneumonia has nearly completely resolved. There is however a an underlying nodular appearing density which measures approximately 2.5 cm in greatest dimension. Some deformity of the minor fissure is noted in this is suspicious for an underlying mass lesion. CT of the chest is recommended for further evaluation. No acute bony abnormality is noted. No other focal abnormality is seen. IMPRESSION: Air complete resolution of previously seen right lower lobe infiltrate. There is a a residual somewhat nodular appearing density identified. This may simply represent residual infiltrate although the possibility of an underlying lesion could not be totally excluded. CT of the chest with contrast is recommended for further evaluation. Electronically Signed   By: Inez Catalina M.D.   On: 11/15/2015 12:42   Ct Chest Wo Contrast  11/15/2015  CLINICAL DATA:  Anemia and elevated white blood cell count following cholecystectomy on 11/06/2015. Generalized weakness. History of end-stage renal disease and right nephrectomy for cancer. EXAM: CT CHEST, ABDOMEN AND PELVIS WITHOUT CONTRAST TECHNIQUE: Multidetector CT imaging of the chest, abdomen and pelvis was performed  following the standard protocol without IV contrast. COMPARISON:  CT abdomen and pelvis 11/05/2015. Chest radiographs 11/15/2015 and 11/04/2015. FINDINGS: CT CHEST No enlarged axillary, mediastinal, or hilar lymph nodes are identified. There is advanced aortic and coronary artery atherosclerosis. The heart is enlarged. There may be a trace right pleural effusion. Moderate volume right lower lobe consolidation is present with associated air bronchograms. Minimal dependent atelectasis is noted in the left lower lobe. Major airways are patent. No suspicious lytic or blastic osseous lesion is identified. Thoracic spondylosis is noted. CT ABDOMEN AND PELVIS Sequelae of interval cholecystectomy are identified. There is a hematoma extending from the region of the gallbladder fossa inferiorly along the right hepatic lobe and into the pericolic gutter. This hematoma measures approximately 12 x 6 x 8 cm. A surgical drain courses through the hematoma. There is mild mass effect on the inferior right hepatic lobe. Small volume blood products are also noted more anteriorly and laterally in the right lower quadrant as well as in the left upper quadrant. The spleen, right adrenal gland, and pancreas are unremarkable. 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 There is a nonobstructing calculus in the interpolar right kidney. No left hydronephrosis. Wall thickening involving the duodenum appears improved from the prior CT. Oral contrast is present in multiple loops of proximal small bowel, and there is a similar appearance of mild proximal small bowel dilatation up to 3.5 cm in diameter. Gas is present in more  distal small bowel and colon. No evidence of appendicitis. Bladder and prostate are unremarkable. Diffuse atherosclerotic calcification is noted involving the abdominal aorta and its major branch vessels. No enlarged lymph nodes are identified. Disc bulging with annular calcification at L4-5 and L5-S1. IMPRESSION: 1.  Interval cholecystectomy with 12 cm hematoma in the gallbladder fossa and about the inferior right hepatic lobe. The surgical drain courses through this hematoma. 2. Persistent mild small bowel dilatation, query ileus. 3. Right lower lobe consolidation consistent with pneumonia. These results will be called to the ordering clinician or representative by the Radiologist Assistant, and communication documented in the PACS or zVision Dashboard. Electronically Signed   By: Logan Bores M.D.   On: 11/15/2015 17:22    Anti-infectives: Anti-infectives    Start     Dose/Rate Route Frequency Ordered Stop   11/16/15 0300  piperacillin-tazobactam (ZOSYN) IVPB 2.25 g     2.25 g 100 mL/hr over 30 Minutes Intravenous Every 8 hours 11/15/15 1943     11/15/15 1830  vancomycin (VANCOCIN) 2,000 mg in sodium chloride 0.9 % 500 mL IVPB     2,000 mg 250 mL/hr over 120 Minutes Intravenous  Once 11/15/15 1817 11/15/15 2120   11/15/15 1745  vancomycin (VANCOCIN) IVPB 1000 mg/200 mL premix  Status:  Discontinued     1,000 mg 200 mL/hr over 60 Minutes Intravenous  Once 11/15/15 1731 11/15/15 1817   11/15/15 1745  piperacillin-tazobactam (ZOSYN) IVPB 3.375 g     3.375 g 100 mL/hr over 30 Minutes Intravenous  Once 11/15/15 1731 11/15/15 1949      Assessment/Plan: s/p * No surgery found * 45 yo M with post op intra-abdominal hematoma following lap chole  -Received 2 u transfusion yesterday with appropriate response.  Would continue to monitor with plan for CBC in morning. However, I suspect that blood loss has stopped.  No indication at this time for operative intervention as drain appears on imaging to be in hematoma and WBC improving. -Continue with Renal diet. -Continue with iv Abx for pna. -Nephrology to determine HD schedule.  LOS: 1 day    Reino Kent 11/16/2015

## 2015-11-16 NOTE — Progress Notes (Signed)
Elevated BP = 189/83. Dr. Allyson Sabal notified. Order received.

## 2015-11-16 NOTE — Progress Notes (Signed)
[facility] PROGRESS NOTE  Hayden [name] [id number] DOB: 04-05-1971 DOA: 11/15/2015   PCP: Louis Meckel, MD     Assessment/Plan: Principal Problem:   Abdominal hematoma Active Problems:   Chronic systolic congestive heart failure/EF 40% PER echo 2010   End stage renal disease ([place])   CVA (cerebral infarction)   GERD (gastroesophageal reflux disease)   Hypertension, well controlled   HTN (hypertension)   HCAP (healthcare-associated pneumonia)   Acute blood loss anemia   Hayden [name] is a 45 y.o. male with end-stage renal disease on home hemodialysis, Hypertension, lacunar infarcts in 06/2015, admitted between 5/6 and 5/11 for community-acquired pneumonia as well as acute cholecystitis, status post laparoscopic cholecystectomy by Dr. Rosendo Gros on 11/06/15, discharged home with a JP drain presents to the ER today because of flank pain increased weakness, acute blood loss anemia, saw Dr. Rosendo Gros in the office yesterday who wanted him to continue the JP drain as it was still putting out frank serosanguineous discharge, CT abdomen pelvis was obtained due to significantly elevated white count of 27.2 and the patient was found have 12 cm hematoma in the gallbladder fossa, right lower lobe pneumonia consistent with healthcare associated pneumonia..  General surgery consultation was obtained, patient was ordered 2 units of packed red blood cells for hemoglobin of 6.9, patient also on home hemodialysis Monday through Friday and nephrology consulted   Assessment and plan Abdominal hematoma vs right upper quadrant abscess  Status post laparoscopic cholecystectomy by general surgery Dr. Dyann Kief on 11/06/2015, treated with total of 5 days of IV vancomycin during hospital stay,discharged home on 3 more days as an outpatient with Augmentin Now also has right lower lobe pneumonia, white count has improved significantly from 27.2> 16.4 Patient has a JP drain in  place General surgery following Patient may need interventional radiology for further evaluation of his right upper quadrant hematoma to rule out abscesses  , defer to general surgery    Chronic systolic congestive heart failure without exacerbation /EF 40% PER echo 2010 Stable, Lasix is being given as the patient is refusing hemodialysis   End stage renal disease (HCC)-Nephrology notified for initiating hemodialysis orders, discussed with Dr Regis Bill , patient does not want to be dialyzed  in the absence of his regular nephrologist, however the patient  was seen by other nephrologists during his most recent admission   CVA (cerebral infarction)-Currently on aspirin and statin   GERD (gastroesophageal reflux disease)-Continue PPI   Hypertension, well controlled   HCAP (healthcare-associated pneumonia)-Started on vancomycin and Zosyn   Acute blood loss anemia-patient transfused with 2 units of packed red blood cells yesterday, hemoglobin improved to 8.2     DVT prophylaxsis SCDs  Code Status:  Full code     Family Communication: Discussed in detail with the patient, all imaging results, lab results explained to the patient   Disposition Plan:  Continue stepdown      Consultants:  Surgery  Nephrology    Procedures:  None  Antibiotics: Anti-infectives    Start     Dose/Rate Route Frequency Ordered Stop   11/16/15 0300  piperacillin-tazobactam (ZOSYN) IVPB 2.25 g     2.25 g 100 mL/hr over 30 Minutes Intravenous Every 8 hours 11/15/15 1943     11/15/15 1830  vancomycin (VANCOCIN) 2,000 mg in sodium chloride 0.9 % 500 mL IVPB     2,000 mg 250 mL/hr over 120 Minutes Intravenous  Once 11/15/15 1817 11/15/15 2120   11/15/15 1745  vancomycin (VANCOCIN) IVPB 1000 mg/200 mL premix  Status:  Discontinued     1,000 mg 200 mL/hr over 60 Minutes Intravenous  Once 11/15/15 1731 11/15/15 1817   11/15/15 1745  piperacillin-tazobactam (ZOSYN) IVPB 3.375 g     3.375  g 100 mL/hr over 30 Minutes Intravenous  Once 11/15/15 1731 11/15/15 1949         HPI/Subjective: Significantly better than yesterday, denies any shortness of breath or chest pain, denies any right upper quadrant pain  Objective: Filed Vitals:   11/16/15 0200 11/16/15 0300 11/16/15 0400 11/16/15 0741  BP:   176/78 164/75  Pulse: 100 99 98 99  Temp:   98.3 F (36.8 C) 98.5 F (36.9 C)  TempSrc:   Oral Oral  Resp: 22 22 30 21   Height:      Weight:      SpO2: 91% 93% 94% 98%    Intake/Output Summary (Last 24 hours) at 11/16/15 0845 Last data filed at 11/16/15 0700  Gross per 24 hour  Intake   1385 ml  Output    550 ml  Net    835 ml    Exam:  Examination:  General exam: Appears calm and comfortable  Respiratory system: Clear to auscultation. Respiratory effort normal. Cardiovascular system: S1 & S2 heard, RRR. No JVD, murmurs, rubs, gallops or clicks. No pedal edema. Gastrointestinal system: Abdomen is nondistended, soft and nontender. No organomegaly or masses felt. Normal bowel sounds heard. Central nervous system: Alert and oriented. No focal neurological deficits. Extremities: Symmetric 5 x 5 power. Skin: No rashes, lesions or ulcers Psychiatry: Judgement and insight appear normal. Mood & affect appropriate.     Data Reviewed: I have personally reviewed following labs and imaging studies  Micro Results Recent Results (from the past 240 hour(s))  MRSA PCR Screening     Status: None   Collection Time: 11/15/15  8:27 PM  Result Value Ref Range Status   MRSA by PCR NEGATIVE NEGATIVE Final    Comment:        The GeneXpert MRSA Assay (FDA approved for NASAL specimens only), is one component of a comprehensive MRSA colonization surveillance program. It is not intended to diagnose MRSA infection nor to guide or monitor treatment for MRSA infections.     Radiology Reports Ct Abdomen Pelvis Wo Contrast  11/15/2015  CLINICAL DATA:  Anemia and elevated  white blood cell count following cholecystectomy on 11/06/2015. Generalized weakness. History of end-stage renal disease and right nephrectomy for cancer. EXAM: CT CHEST, ABDOMEN AND PELVIS WITHOUT CONTRAST TECHNIQUE: Multidetector CT imaging of the chest, abdomen and pelvis was performed following the standard protocol without IV contrast. COMPARISON:  CT abdomen and pelvis 11/05/2015. Chest radiographs 11/15/2015 and 11/04/2015. FINDINGS: CT CHEST No enlarged axillary, mediastinal, or hilar lymph nodes are identified. There is advanced aortic and coronary artery atherosclerosis. The heart is enlarged. There may be a trace right pleural effusion. Moderate volume right lower lobe consolidation is present with associated air bronchograms. Minimal dependent atelectasis is noted in the left lower lobe. Major airways are patent. No suspicious lytic or blastic osseous lesion is identified. Thoracic spondylosis is noted. CT ABDOMEN AND PELVIS Sequelae of interval cholecystectomy are identified. There is a hematoma extending from the region of the gallbladder fossa inferiorly along the right hepatic lobe and into the pericolic gutter. This hematoma measures approximately 12 x 6 x 8 cm. A surgical drain courses through the hematoma. There is mild mass effect on the inferior right hepatic  lobe. Small volume blood products are also noted more anteriorly and laterally in the right lower quadrant as well as in the left upper quadrant. The spleen, right adrenal gland, and pancreas are unremarkable. 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 There is a nonobstructing calculus in the interpolar right kidney. No left hydronephrosis. Wall thickening involving the duodenum appears improved from the prior CT. Oral contrast is present in multiple loops of proximal small bowel, and there is a similar appearance of mild proximal small bowel dilatation up to 3.5 cm in diameter. Gas is present in more distal small bowel and colon. No  evidence of appendicitis. Bladder and prostate are unremarkable. Diffuse atherosclerotic calcification is noted involving the abdominal aorta and its major branch vessels. No enlarged lymph nodes are identified. Disc bulging with annular calcification at L4-5 and L5-S1. IMPRESSION: 1. Interval cholecystectomy with 12 cm hematoma in the gallbladder fossa and about the inferior right hepatic lobe. The surgical drain courses through this hematoma. 2. Persistent mild small bowel dilatation, query ileus. 3. Right lower lobe consolidation consistent with pneumonia. These results will be called to the ordering clinician or representative by the Radiologist Assistant, and communication documented in the PACS or zVision Dashboard. Electronically Signed   By: Logan Bores M.D.   On: 11/15/2015 17:22   Ct Abdomen Pelvis Wo Contrast  11/06/2015  CLINICAL DATA:  Right lower quadrant pain.  Fever EXAM: CT ABDOMEN AND PELVIS WITHOUT CONTRAST TECHNIQUE: Multidetector CT imaging of the abdomen and pelvis was performed following the standard protocol without IV contrast. COMPARISON:  CT 05/16/2014 FINDINGS: Lower chest: Right lower lobe infiltrate with air bronchograms. Probable pneumonia. Negative for pleural effusion. Left lung base clear. Hepatobiliary: Unenhanced images of the liver are negative. Markedly distended gallbladder with increased density in the gallbladder lumen suggesting sludge or hemorrhage or cholecystitis. There is mild edema surrounding the gallbladder neck consistent with acute inflammation. Bile ducts nondilated. Pancreas: Negative Spleen: Negative Adrenals/Urinary Tract: Negative for renal obstruction. 3 mm nonobstructing stone right lower pole. Fatty lesion left adrenal gland is unchanged may represent lipoma or myelo lipoma. Urinary bladder normal. Stomach/Bowel: Oral contrast is present in the stomach and small bowel extending into the colon and rectum. Small bowel mildly distended with air-fluid levels.  This may represent ileus or low-grade obstruction. Normal appendix. There is prominent thickening of the duodenum in the second and third portions. There is stranding in the adjacent fat around the duodenum. This could represent inflammatory changes from peptic ulcer disease or duodenitis. Carcinoma cannot be excluded. Duodenal thickening is near the distended gallbladder however these may be two separate processes. Vascular/Lymphatic: Atherosclerotic calcification in the aorta and iliac arteries. No aneurysm. No lymphadenopathy. Reproductive: Negative prostate.  No bladder wall thickening. Other: No free-fluid Musculoskeletal: Disc degeneration and spurring L4-5 and L5-S1. No fracture or bony mass lesion. IMPRESSION: Markedly distended gallbladder with diffusely increased density in the gallbladder. This could represent acute cholecystitis versus poor functioning of the gallbladder filled with sludge. Thickening of the second and third portions of the duodenum with surrounding edema in the adjacent fat. This may represent peptic ulcer disease or duodenitis. Carcinoma not excluded. Small bowel is mildly distended with air-fluid levels suggesting ileus or partial small obstruction. Oral contrast extends into the rectum Right lower lobe infiltrate, probable pneumonia. Normal appendix Electronically Signed   By: Franchot Gallo M.D.   On: 11/06/2015 08:12   Dg Chest 2 View  11/15/2015  CLINICAL DATA:  Followup right lower lobe pneumonia  EXAM: CHEST  2 VIEW COMPARISON:  11/04/2015 FINDINGS: Cardiac shadow is stable. The lungs are well aerated bilaterally. The previously seen right lower lobe pneumonia has nearly completely resolved. There is however a an underlying nodular appearing density which measures approximately 2.5 cm in greatest dimension. Some deformity of the minor fissure is noted in this is suspicious for an underlying mass lesion. CT of the chest is recommended for further evaluation. No acute bony  abnormality is noted. No other focal abnormality is seen. IMPRESSION: Air complete resolution of previously seen right lower lobe infiltrate. There is a a residual somewhat nodular appearing density identified. This may simply represent residual infiltrate although the possibility of an underlying lesion could not be totally excluded. CT of the chest with contrast is recommended for further evaluation. Electronically Signed   By: Inez Catalina M.D.   On: 11/15/2015 12:42   Dg Chest 2 View  11/04/2015  CLINICAL DATA:  Pneumonia. EXAM: CHEST  2 VIEW COMPARISON:  06/20/2015 FINDINGS: Focal airspace disease in the right lower lobe which is new from prior. No cavitation or effusion. Generous heart size without definitive cardiomegaly. Stable aortic and hilar contours. IMPRESSION: Right lower lobe pneumonia. Followup PA and lateral chest X-ray is recommended in 3-4 weeks following trial of antibiotic therapy to ensure resolution. Electronically Signed   By: Monte Fantasia M.D.   On: 11/04/2015 10:14   Dg Abd 1 View  11/04/2015  CLINICAL DATA:  Two day history of abdominal pain EXAM: ABDOMEN - 1 VIEW COMPARISON:  CT abdomen and pelvis May 16, 2014 FINDINGS: There is no appreciable bowel dilatation or air-fluid level suggesting obstruction. No free air is seen on this supine examination. There are vascular calcifications in the pelvis. IMPRESSION: No demonstrable bowel obstruction or free air. Electronically Signed   By: Lowella Grip III M.D.   On: 11/04/2015 10:13   Ct Chest Wo Contrast  11/15/2015  CLINICAL DATA:  Anemia and elevated white blood cell count following cholecystectomy on 11/06/2015. Generalized weakness. History of end-stage renal disease and right nephrectomy for cancer. EXAM: CT CHEST, ABDOMEN AND PELVIS WITHOUT CONTRAST TECHNIQUE: Multidetector CT imaging of the chest, abdomen and pelvis was performed following the standard protocol without IV contrast. COMPARISON:  CT abdomen and pelvis  11/05/2015. Chest radiographs 11/15/2015 and 11/04/2015. FINDINGS: CT CHEST No enlarged axillary, mediastinal, or hilar lymph nodes are identified. There is advanced aortic and coronary artery atherosclerosis. The heart is enlarged. There may be a trace right pleural effusion. Moderate volume right lower lobe consolidation is present with associated air bronchograms. Minimal dependent atelectasis is noted in the left lower lobe. Major airways are patent. No suspicious lytic or blastic osseous lesion is identified. Thoracic spondylosis is noted. CT ABDOMEN AND PELVIS Sequelae of interval cholecystectomy are identified. There is a hematoma extending from the region of the gallbladder fossa inferiorly along the right hepatic lobe and into the pericolic gutter. This hematoma measures approximately 12 x 6 x 8 cm. A surgical drain courses through the hematoma. There is mild mass effect on the inferior right hepatic lobe. Small volume blood products are also noted more anteriorly and laterally in the right lower quadrant as well as in the left upper quadrant. The spleen, right adrenal gland, and pancreas are unremarkable. 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 There is a nonobstructing calculus in the interpolar right kidney. No left hydronephrosis. Wall thickening involving the duodenum appears improved from the prior CT. Oral contrast is present in multiple  loops of proximal small bowel, and there is a similar appearance of mild proximal small bowel dilatation up to 3.5 cm in diameter. Gas is present in more distal small bowel and colon. No evidence of appendicitis. Bladder and prostate are unremarkable. Diffuse atherosclerotic calcification is noted involving the abdominal aorta and its major branch vessels. No enlarged lymph nodes are identified. Disc bulging with annular calcification at L4-5 and L5-S1. IMPRESSION: 1. Interval cholecystectomy with 12 cm hematoma in the gallbladder fossa and about the inferior  right hepatic lobe. The surgical drain courses through this hematoma. 2. Persistent mild small bowel dilatation, query ileus. 3. Right lower lobe consolidation consistent with pneumonia. These results will be called to the ordering clinician or representative by the Radiologist Assistant, and communication documented in the PACS or zVision Dashboard. Electronically Signed   By: Logan Bores M.D.   On: 11/15/2015 17:22   US Abdomen Limited Ruq  11/06/2015  CLINICAL DATA:  Right upper quadrant pain for 4 days EXAM: US ABDOMEN LIMITED - RIGHT UPPER QUADRANT COMPARISON:  CT abdomen and pelvis Nov 05, 2015 FINDINGS: Gallbladder: Gallbladder appears distended with wall thickening and wall edema. There is sludge in the gallbladder with small intermingled gallstones. The patient is focally tender over the gallbladder. There is equivocal pericholecystic fluid. Common bile duct: Diameter: 4 mm. There is no intrahepatic or extrahepatic biliary duct dilatation. Liver: No focal lesion identified. Within normal limits in parenchymal echogenicity. IMPRESSION: Findings as described above consistent with a degree of acute cholecystitis. These results will be called to the ordering clinician or representative by the Radiologist Assistant, and communication documented in the PACS or zVision Dashboard. Electronically Signed   By: Lowella Grip III M.D.   On: 11/06/2015 10:10     CBC  Recent Labs Lab 11/15/15 1202 11/16/15 0239 11/16/15 0801  WBC 27.2* 17.5* 16.4*  HGB 6.9* 8.3* 8.2*  HCT 20.6* 25.6* 25.1*  PLT 575* 637* 603*  MCV 80.5 80.5 81.8  MCH 25.8* 26.1 26.7  MCHC 32.0 32.4 32.7  RDW 17.8* 16.8* 17.0*  LYMPHSABS 0.8  --   --   MONOABS 1.6*  --   --   EOSABS 0.5  --   --   BASOSABS 0.0  --   --     Chemistries   Recent Labs Lab 11/15/15 1202 11/15/15 1820 11/16/15 0239 11/16/15 0800  NA 138  --  139 140  K 3.2*  --  3.3* 3.6  CL 96*  --  98* 98*  CO2 24  --  23 23  GLUCOSE 96  --  79 90   BUN 55*  --  61* 65*  CREATININE 8.79*  --  9.47* 9.65*  CALCIUM 7.8*  --  7.3* 7.4*  MG  --  2.2  --   --   AST 34  --  36  --   ALT 38  --  43  --   ALKPHOS 220*  --  236*  --   BILITOT 0.8  --  0.9  --    ------------------------------------------------------------------------------------------------------------------ estimated creatinine clearance is 11.8 mL/min (by C-G formula based on Cr of 9.65). ------------------------------------------------------------------------------------------------------------------ No results for input(s): HGBA1C in the last 72 hours. ------------------------------------------------------------------------------------------------------------------ No results for input(s): CHOL, HDL, LDLCALC, TRIG, CHOLHDL, LDLDIRECT in the last 72 hours. ------------------------------------------------------------------------------------------------------------------ No results for input(s): TSH, T4TOTAL, T3FREE, THYROIDAB in the last 72 hours.  Invalid input(s): FREET3 ------------------------------------------------------------------------------------------------------------------  Recent Labs  11/15/15 1342  VITAMINB12 712  FOLATE 3.4*  FERRITIN 1444*  TIBC 164*  IRON 7*  RETICCTPCT 2.4    Coagulation profile No results for input(s): INR, PROTIME in the last 168 hours.  No results for input(s): DDIMER in the last 72 hours.  Cardiac Enzymes  Recent Labs Lab 11/15/15 1202 11/15/15 1820 11/16/15 0239  TROPONINI 0.05* 0.06* 0.10*   ------------------------------------------------------------------------------------------------------------------ Invalid input(s): POCBNP   CBG: No results for input(s): GLUCAP in the last 168 hours.     Studies: Ct Abdomen Pelvis Wo Contrast  11/15/2015  CLINICAL DATA:  Anemia and elevated white blood cell count following cholecystectomy on 11/06/2015. Generalized weakness. History of end-stage renal disease  and right nephrectomy for cancer. EXAM: CT CHEST, ABDOMEN AND PELVIS WITHOUT CONTRAST TECHNIQUE: Multidetector CT imaging of the chest, abdomen and pelvis was performed following the standard protocol without IV contrast. COMPARISON:  CT abdomen and pelvis 11/05/2015. Chest radiographs 11/15/2015 and 11/04/2015. FINDINGS: CT CHEST No enlarged axillary, mediastinal, or hilar lymph nodes are identified. There is advanced aortic and coronary artery atherosclerosis. The heart is enlarged. There may be a trace right pleural effusion. Moderate volume right lower lobe consolidation is present with associated air bronchograms. Minimal dependent atelectasis is noted in the left lower lobe. Major airways are patent. No suspicious lytic or blastic osseous lesion is identified. Thoracic spondylosis is noted. CT ABDOMEN AND PELVIS Sequelae of interval cholecystectomy are identified. There is a hematoma extending from the region of the gallbladder fossa inferiorly along the right hepatic lobe and into the pericolic gutter. This hematoma measures approximately 12 x 6 x 8 cm. A surgical drain courses through the hematoma. There is mild mass effect on the inferior right hepatic lobe. Small volume blood products are also noted more anteriorly and laterally in the right lower quadrant as well as in the left upper quadrant. The spleen, right adrenal gland, and pancreas are unremarkable. 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 There is a nonobstructing calculus in the interpolar right kidney. No left hydronephrosis. Wall thickening involving the duodenum appears improved from the prior CT. Oral contrast is present in multiple loops of proximal small bowel, and there is a similar appearance of mild proximal small bowel dilatation up to 3.5 cm in diameter. Gas is present in more distal small bowel and colon. No evidence of appendicitis. Bladder and prostate are unremarkable. Diffuse atherosclerotic calcification is noted involving  the abdominal aorta and its major branch vessels. No enlarged lymph nodes are identified. Disc bulging with annular calcification at L4-5 and L5-S1. IMPRESSION: 1. Interval cholecystectomy with 12 cm hematoma in the gallbladder fossa and about the inferior right hepatic lobe. The surgical drain courses through this hematoma. 2. Persistent mild small bowel dilatation, query ileus. 3. Right lower lobe consolidation consistent with pneumonia. These results will be called to the ordering clinician or representative by the Radiologist Assistant, and communication documented in the PACS or zVision Dashboard. Electronically Signed   By: Logan Bores M.D.   On: 11/15/2015 17:22   Dg Chest 2 View  11/15/2015  CLINICAL DATA:  Followup right lower lobe pneumonia EXAM: CHEST  2 VIEW COMPARISON:  11/04/2015 FINDINGS: Cardiac shadow is stable. The lungs are well aerated bilaterally. The previously seen right lower lobe pneumonia has nearly completely resolved. There is however a an underlying nodular appearing density which measures approximately 2.5 cm in greatest dimension. Some deformity of the minor fissure is noted in this is suspicious for an underlying mass lesion. CT of the chest is recommended for further evaluation.  No acute bony abnormality is noted. No other focal abnormality is seen. IMPRESSION: Air complete resolution of previously seen right lower lobe infiltrate. There is a a residual somewhat nodular appearing density identified. This may simply represent residual infiltrate although the possibility of an underlying lesion could not be totally excluded. CT of the chest with contrast is recommended for further evaluation. Electronically Signed   By: Inez Catalina M.D.   On: 11/15/2015 12:42   Ct Chest Wo Contrast  11/15/2015  CLINICAL DATA:  Anemia and elevated white blood cell count following cholecystectomy on 11/06/2015. Generalized weakness. History of end-stage renal disease and right nephrectomy for  cancer. EXAM: CT CHEST, ABDOMEN AND PELVIS WITHOUT CONTRAST TECHNIQUE: Multidetector CT imaging of the chest, abdomen and pelvis was performed following the standard protocol without IV contrast. COMPARISON:  CT abdomen and pelvis 11/05/2015. Chest radiographs 11/15/2015 and 11/04/2015. FINDINGS: CT CHEST No enlarged axillary, mediastinal, or hilar lymph nodes are identified. There is advanced aortic and coronary artery atherosclerosis. The heart is enlarged. There may be a trace right pleural effusion. Moderate volume right lower lobe consolidation is present with associated air bronchograms. Minimal dependent atelectasis is noted in the left lower lobe. Major airways are patent. No suspicious lytic or blastic osseous lesion is identified. Thoracic spondylosis is noted. CT ABDOMEN AND PELVIS Sequelae of interval cholecystectomy are identified. There is a hematoma extending from the region of the gallbladder fossa inferiorly along the right hepatic lobe and into the pericolic gutter. This hematoma measures approximately 12 x 6 x 8 cm. A surgical drain courses through the hematoma. There is mild mass effect on the inferior right hepatic lobe. Small volume blood products are also noted more anteriorly and laterally in the right lower quadrant as well as in the left upper quadrant. The spleen, right adrenal gland, and pancreas are unremarkable. 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 There is a nonobstructing calculus in the interpolar right kidney. No left hydronephrosis. Wall thickening involving the duodenum appears improved from the prior CT. Oral contrast is present in multiple loops of proximal small bowel, and there is a similar appearance of mild proximal small bowel dilatation up to 3.5 cm in diameter. Gas is present in more distal small bowel and colon. No evidence of appendicitis. Bladder and prostate are unremarkable. Diffuse atherosclerotic calcification is noted involving the abdominal aorta and  its major branch vessels. No enlarged lymph nodes are identified. Disc bulging with annular calcification at L4-5 and L5-S1. IMPRESSION: 1. Interval cholecystectomy with 12 cm hematoma in the gallbladder fossa and about the inferior right hepatic lobe. The surgical drain courses through this hematoma. 2. Persistent mild small bowel dilatation, query ileus. 3. Right lower lobe consolidation consistent with pneumonia. These results will be called to the ordering clinician or representative by the Radiologist Assistant, and communication documented in the PACS or zVision Dashboard. Electronically Signed   By: Logan Bores M.D.   On: 11/15/2015 17:22      Lab Results  Component Value Date   HGBA1C 5.4 06/20/2015   HGBA1C 9.9* 09/22/2012   HGBA1C * 12/22/2008    11.5 (NOTE) The ADA recommends the following therapeutic goal for glycemic control related to Hgb A1c measurement: Goal of therapy: <6.5 Hgb A1c  Reference: American Diabetes Association: Clinical Practice Recommendations 2010, Diabetes Care, 2010, 33: (Suppl  1).   Lab Results  Component Value Date   LDLCALC 125* 06/20/2015   CREATININE 9.65* 11/16/2015       Scheduled  Meds: . sodium chloride   Intravenous Once  . diatrizoate meglumine-sodium  30 mL Oral Once  . pantoprazole  40 mg Oral Daily  . piperacillin-tazobactam (ZOSYN)  IV  2.25 g Intravenous Q8H  . pravastatin  20 mg Oral q1800  . saccharomyces boulardii  250 mg Oral BID  . sodium chloride flush  3 mL Intravenous Q12H   Continuous Infusions:    LOS: 1 day    Time spent: >30 MINS    [facility]  [name] Pager [phone]. If 7PM-7AM, please contact night-coverage at [email or web address], password [facility] 11/16/2015, 8:45 AM  LOS: 1 day

## 2015-11-17 LAB — COMPREHENSIVE METABOLIC PANEL
ALBUMIN: 2.1 g/dL — AB (ref 3.5–5.0)
ALK PHOS: 291 U/L — AB (ref 38–126)
ALT: 46 U/L (ref 17–63)
AST: 34 U/L (ref 15–41)
Anion gap: 19 — ABNORMAL HIGH (ref 5–15)
BILIRUBIN TOTAL: 1.2 mg/dL (ref 0.3–1.2)
BUN: 75 mg/dL — AB (ref 6–20)
CALCIUM: 7.6 mg/dL — AB (ref 8.9–10.3)
CO2: 24 mmol/L (ref 22–32)
CREATININE: 11.12 mg/dL — AB (ref 0.61–1.24)
Chloride: 98 mmol/L — ABNORMAL LOW (ref 101–111)
GFR calc Af Amer: 6 mL/min — ABNORMAL LOW (ref >60)
GFR calc non Af Amer: 5 mL/min — ABNORMAL LOW (ref >60)
GLUCOSE: 88 mg/dL (ref 65–99)
POTASSIUM: 3.4 mmol/L — AB (ref 3.5–5.1)
Sodium: 141 mmol/L (ref 135–145)
TOTAL PROTEIN: 6.2 g/dL — AB (ref 6.5–8.1)

## 2015-11-17 LAB — URINE CULTURE

## 2015-11-17 LAB — CBC
HEMATOCRIT: 26.7 % — AB (ref 39.0–52.0)
Hemoglobin: 8.4 g/dL — ABNORMAL LOW (ref 13.0–17.0)
MCH: 25.1 pg — ABNORMAL LOW (ref 26.0–34.0)
MCHC: 31.5 g/dL (ref 30.0–36.0)
MCV: 79.7 fL (ref 78.0–100.0)
Platelets: 743 10*3/uL — ABNORMAL HIGH (ref 150–400)
RBC: 3.35 MIL/uL — ABNORMAL LOW (ref 4.22–5.81)
RDW: 17 % — ABNORMAL HIGH (ref 11.5–15.5)
WBC: 17.3 10*3/uL — AB (ref 4.0–10.5)

## 2015-11-17 MED ORDER — LABETALOL HCL 300 MG PO TABS
150.0000 mg | ORAL_TABLET | Freq: Two times a day (BID) | ORAL | Status: DC
  Administered 2015-11-17 – 2015-11-20 (×7): 150 mg via ORAL
  Filled 2015-11-17 (×2): qty 1
  Filled 2015-11-17: qty 0.5
  Filled 2015-11-17: qty 1
  Filled 2015-11-17: qty 0.5
  Filled 2015-11-17 (×3): qty 1

## 2015-11-17 MED ORDER — CLONIDINE HCL 0.2 MG/24HR TD PTWK
0.2000 mg | MEDICATED_PATCH | TRANSDERMAL | Status: DC
  Administered 2015-11-17: 0.2 mg via TRANSDERMAL
  Filled 2015-11-17: qty 1

## 2015-11-17 MED ORDER — AMLODIPINE BESYLATE 10 MG PO TABS
10.0000 mg | ORAL_TABLET | Freq: Every day | ORAL | Status: DC
  Administered 2015-11-17 – 2015-11-19 (×3): 10 mg via ORAL
  Filled 2015-11-17 (×3): qty 1

## 2015-11-17 MED ORDER — VANCOMYCIN HCL IN DEXTROSE 1-5 GM/200ML-% IV SOLN
INTRAVENOUS | Status: AC
  Administered 2015-11-17: 1000 mg via INTRAVENOUS
  Filled 2015-11-17: qty 200

## 2015-11-17 MED ORDER — DARBEPOETIN ALFA 200 MCG/0.4ML IJ SOSY
200.0000 ug | PREFILLED_SYRINGE | INTRAMUSCULAR | Status: DC
  Filled 2015-11-17: qty 0.4

## 2015-11-17 MED ORDER — CALCIUM ACETATE (PHOS BINDER) 667 MG PO CAPS
1334.0000 mg | ORAL_CAPSULE | Freq: Three times a day (TID) | ORAL | Status: DC
  Administered 2015-11-17 – 2015-11-20 (×9): 1334 mg via ORAL
  Filled 2015-11-17 (×9): qty 2

## 2015-11-17 MED ORDER — VANCOMYCIN HCL IN DEXTROSE 1-5 GM/200ML-% IV SOLN
1000.0000 mg | INTRAVENOUS | Status: DC
  Administered 2015-11-17: 1000 mg via INTRAVENOUS

## 2015-11-17 MED ORDER — HYDRALAZINE HCL 20 MG/ML IJ SOLN
INTRAMUSCULAR | Status: AC
  Filled 2015-11-17: qty 1

## 2015-11-17 NOTE — Consult Note (Deleted)
Renal Service Consult Note [facility]  Hayden [name] 11/17/2015 [place] D Requesting Physician:  Dr. Allyson Sabal  Reason for Consult:  ESRD pt with fatigue, anemia HPI: The patient is a 45 y.o. 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  Feeling fatigued.  Recently underwent lap chole surgery.  Asked to see for ESRD.   Patient on HD about 3 yrs, home HD 1 year.  Lives in [place], [place] with his wife.  Worked as a Engineer, building services.  Wife works for [employer] making pepto-bismol.  No tob/ etoh.  2 children who live along with them, ages 44 and 66.  HTN > 20 yrs, used to be diabetic but lost 125 lbs and is off medication.  Does home HD 5 days per week for 2.5 - 3 hr.  Carves walking canes as a hobby.   ROS  denies CP  no joint pain   no HA  no blurry vision  no rash  no diarrhea  no nausea/ vomiting  no dysuria  no difficulty voiding  no change in urine color    Past Medical History  Past Medical History  Diagnosis Date  . History of kidney cancer   . Hypertension   . CHF (congestive heart failure) ([place]) [id number]    Acute systolic and diastolic CHF  . Renal disorder   . Obesity   . Hyperlipidemia   . Anemia March 2014  . Cancer ([place])     Kidney  . PONV (postoperative nausea and vomiting)   . Depression     & rage --  was in counseling....great now  . Diabetes mellitus     NO DM SINCE LOST 130LBS  . Stroke [facility])    Past Surgical History  Past Surgical History  Procedure Laterality Date  . Nephrectomy Right 2008    partial  . Testicle torsion reduction    . Hernia repair    . Tonsillectomy and adenoidectomy    . Av fistula placement Left 01/26/2013    Procedure: ARTERIOVENOUS (AV) FISTULA CREATION - LEFT RADIAL CEPHALIC AVF;  Surgeon: Angelia Mould, MD;  Location: [place];  Service: Vascular;  Laterality: Left;  . Cholecystectomy  11/06/2015    Procedure: LAPAROSCOPIC CHOLECYSTECTOMY;   Surgeon: Ralene Ok, MD;  Location: [facility];  Service: General;;   Family History  Family History  Problem Relation Age of Onset  . Heart disease Mother     Heart Disease before age 57  . Deep vein thrombosis Father   . Heart attack Father   . Other Other    Social History  reports that he has been smoking Cigarettes.  He has a 2.5 pack-year smoking history. He has never used smokeless tobacco. He reports that he uses illicit drugs (Marijuana). He reports that he does not drink alcohol. Allergies  Allergies  Allergen Reactions  . Dilaudid [Hydromorphone Hcl] Other (See Comments)    Abnormal behavior, "verbal and physically abusive", "psychosis"  . Morphine And Related Itching    Syncopal episode   Home medications Prior to Admission medications   Medication Sig Start Date End Date Taking? Authorizing Provider  ALPRAZolam Duanne Moron) 1 MG tablet Take 1 mg by mouth at bedtime as needed for anxiety.   Yes Historical Provider, MD  amLODipine (NORVASC) 10 MG tablet Take 10 mg by mouth daily.   Yes Historical Provider, MD  aspirin EC 81 MG tablet Take  81 mg by mouth daily.   Yes Historical Provider, MD  buPROPion (WELLBUTRIN SR) 150 MG 12 hr tablet Take one tab by mouth every morning & two tabs every evening   Yes Historical Provider, MD  calcitRIOL (ROCALTROL) 0.5 MCG capsule Take 0.5 mcg by mouth daily.   Yes Historical Provider, MD  Cholecalciferol (VITAMIN D PO) Take 1 capsule by mouth daily.   Yes Historical Provider, MD  cinacalcet (SENSIPAR) 30 MG tablet Take 60 mg by mouth daily.    Yes Historical Provider, MD  cloNIDine (CATAPRES - DOSED IN MG/24 HR) 0.2 mg/24hr patch Place 1 patch onto the skin once a week.   Yes Historical Provider, MD  escitalopram (LEXAPRO) 20 MG tablet Take 20 mg by mouth daily.   Yes Historical Provider, MD  furosemide (LASIX) 80 MG tablet Take 80 mg by mouth 2 (two) times daily.   Yes Historical Provider, MD  hydrALAZINE (APRESOLINE) 100 MG tablet Take 100  mg by mouth 2 (two) times daily.   Yes Historical Provider, MD  labetalol (NORMODYNE) 100 MG tablet Take 150 mg by mouth 2 (two) times daily.    Yes Historical Provider, MD  omeprazole (PRILOSEC) 40 MG capsule Take 40 mg by mouth daily.   Yes Historical Provider, MD  oxyCODONE (OXY IR/ROXICODONE) 5 MG immediate release tablet Take 1 tablet (5 mg total) by mouth every 6 (six) hours as needed for severe pain. 11/09/15  Yes Silver Huguenin Elgergawy, MD  pravastatin (PRAVACHOL) 20 MG tablet Take 20 mg by mouth daily.   Yes Historical Provider, MD  saccharomyces boulardii (FLORASTOR) 250 MG capsule Take 1 capsule (250 mg total) by mouth 2 (two) times daily. 11/09/15  Yes Albertine Patricia, MD  amoxicillin-clavulanate (AUGMENTIN) 500-125 MG tablet Take 1 tablet (500 mg total) by mouth daily. Patient not taking: Reported on 11/15/2015 11/09/15   Albertine Patricia, MD   Liver Function Tests  Recent Labs Lab 11/15/15 1202 11/16/15 0239 11/16/15 0800 11/17/15 0323  AST 34 36  --  34  ALT 38 43  --  46  ALKPHOS 220* 236*  --  291*  BILITOT 0.8 0.9  --  1.2  PROT 5.3* 5.6*  --  6.2*  ALBUMIN 2.1* 2.0* 2.1* 2.1*    Recent Labs Lab 11/15/15 1202  LIPASE 85*   CBC  Recent Labs Lab 11/15/15 1202 11/16/15 0239 11/16/15 0801 11/17/15 0323  WBC 27.2* 17.5* 16.4* 17.3*  NEUTROABS 24.3*  --   --   --   HGB 6.9* 8.3* 8.2* 8.4*  HCT 20.6* 25.6* 25.1* 26.7*  MCV 80.5 80.5 81.8 79.7  PLT 575* 637* 603* Q000111Q*   Basic Metabolic Panel  Recent Labs Lab 11/15/15 1202 11/16/15 0239 11/16/15 0800 11/17/15 0323  NA 138 139 140 141  K 3.2* 3.3* 3.6 3.4*  CL 96* 98* 98* 98*  CO2 24 23 23 24   GLUCOSE 96 79 90 88  BUN 55* 61* 65* 75*  CREATININE 8.79* 9.47* 9.65* 11.12*  CALCIUM 7.8* 7.3* 7.4* 7.6*  PHOS  --   --  10.0*  --     Filed Vitals:   11/17/15 0649 11/17/15 0700 11/17/15 0730 11/17/15 0800  BP: 165/85 169/86 182/92 173/89  Pulse: 100 103 101 99  Temp:      TempSrc:      Resp:       Height:      Weight:      SpO2:       Exam Alert  no distress No rash, cyanosis or gangrene Sclera anicteric, throat clear  No jvd or bruits Chest clear bilat RRR no MRG Abd soft ntnd no mass or ascites +bs GU normal male MS no joint effusions or deformity Ext no LE edema / no wounds or ulcers Neuro is alert, Ox 3 , nf  Home HD: 2.5-3 hrs 5 days per week, get orders   Assessment: 1. Anemia , sp recent lap chole, hematoma in GB fossa by CT 2. RLL PNA - by CT on IV abx 3. ESRD HD tomorrow 4. HTN norvasc/ labet/ hydral 5. Hx CVA   Plan - HD tomorrow  Kelly Splinter MD [facility] pager [phone]    cell [phone] 11/17/2015, 8:38 AM

## 2015-11-17 NOTE — Progress Notes (Signed)
Patient ID: Hayden [name], male   DOB: 31-Jan-1971, 45 y.o.   MRN: [id number]   LOS: 2 days   Subjective: Just finished dialysis, feeling about the same. Is not eating great because he gets a little nauseated after he eats for a while.   Objective: Vital signs in last 24 hours: Temp:  [98.2 F (36.8 C)-98.9 F (37.2 C)] 98.9 F (37.2 C) (05/19 1022) Pulse Rate:  [93-110] 96 (05/19 1022) Resp:  [20-25] 20 (05/19 1022) BP: (148-189)/(74-98) 172/80 mmHg (05/19 1022) SpO2:  [91 %-100 %] 97 % (05/19 1022) Weight:  [99 kg (218 lb 4.1 oz)-100.6 kg (221 lb 12.5 oz)] 99 kg (218 lb 4.1 oz) (05/19 1022)    JP: 62ml/24h, dark bloody fluid   Laboratory  CBC  Recent Labs  11/16/15 0801 11/17/15 0323  WBC 16.4* 17.3*  HGB 8.2* 8.4*  HCT 25.1* 26.7*  PLT 603* 743*   BMET  Recent Labs  11/16/15 0800 11/17/15 0323  NA 140 141  K 3.6 3.4*  CL 98* 98*  CO2 23 24  GLUCOSE 90 88  BUN 65* 75*  CREATININE 9.65* 11.12*  CALCIUM 7.4* 7.6*    Physical Exam General appearance: alert and no distress Resp: clear to auscultation bilaterally Cardio: regular rate and rhythm GI: Soft, +BS   Assessment/Plan: 45 yo M with post op intra-abdominal hematoma following lap chole  HGB remains stable, continue drain. Unsure if nausea is secondary to some gastric or duodenal irritation from the hematoma or a mild ileus. It is likely self-limiting and should get better with time. Ok to discharge from surgical standpoint with drain in place. -Continue with Renal diet. -Continue with iv Abx for pna. -Nephrology to determine HD schedule.   Lisette Abu, PA-C Pager: [phone] 11/17/2015

## 2015-11-17 NOTE — Consult Note (Signed)
Renal Service Consult Note [facility]  Hayden [name] 11/17/2015 [place] D Requesting Physician:  Dr. Allyson Sabal  Reason for Consult:  ESRD pt with fatigue, anemia HPI: The patient is a 45 y.o. 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  Feeling fatigued.  Recently underwent lap chole surgery.  Asked to see for ESRD.   Patient on HD about 3 yrs, home HD 1 year.  Lives in [place], [place] with his wife.  Worked as a Engineer, building services.  Wife works for [employer] making pepto-bismol.  No tob/ etoh.  2 children who live along with them, ages 105 and 51.  HTN > 20 yrs, used to be diabetic but lost 125 lbs and is off medication.  Does home HD 5 days per week for 2.5 - 3 hr.  Carves walking canes as a hobby.   ROS  denies CP  no joint pain   no HA  no blurry vision  no rash  no diarrhea  no nausea/ vomiting  no dysuria  no difficulty voiding  no change in urine color    Past Medical History  Past Medical History  Diagnosis Date  . History of kidney cancer   . Hypertension   . CHF (congestive heart failure) ([place]) [id number]    Acute systolic and diastolic CHF  . Renal disorder   . Obesity   . Hyperlipidemia   . Anemia March 2014  . Cancer ([place])     Kidney  . PONV (postoperative nausea and vomiting)   . Depression     & rage --  was in counseling....great now  . Diabetes mellitus     NO DM SINCE LOST 130LBS  . Stroke [facility])    Past Surgical History  Past Surgical History  Procedure Laterality Date  . Nephrectomy Right 2008    partial  . Testicle torsion reduction    . Hernia repair    . Tonsillectomy and adenoidectomy    . Av fistula placement Left 01/26/2013    Procedure: ARTERIOVENOUS (AV) FISTULA CREATION - LEFT RADIAL CEPHALIC AVF;  Surgeon: Angelia Mould, MD;  Location: [place];  Service: Vascular;  Laterality: Left;  . Cholecystectomy  11/06/2015    Procedure: LAPAROSCOPIC CHOLECYSTECTOMY;   Surgeon: Ralene Ok, MD;  Location: [facility] OR;  Service: General;;   Family History  Family History  Problem Relation Age of Onset  . Heart disease Mother     Heart Disease before age 27  . Deep vein thrombosis Father   . Heart attack Father   . Other Other    Social History  reports that he has been smoking Cigarettes.  He has a 2.5 pack-year smoking history. He has never used smokeless tobacco. He reports that he uses illicit drugs (Marijuana). He reports that he does not drink alcohol. Allergies  Allergies  Allergen Reactions  . Dilaudid [Hydromorphone Hcl] Other (See Comments)    Abnormal behavior, "verbal and physically abusive", "psychosis"  . Morphine And Related Itching    Syncopal episode   Home medications Prior to Admission medications   Medication Sig Start Date End Date Taking? Authorizing Provider  ALPRAZolam Duanne Moron) 1 MG tablet Take 1 mg by mouth at bedtime as needed for anxiety.   Yes Historical Provider, MD  amLODipine (NORVASC) 10 MG tablet Take 10 mg by mouth daily.   Yes Historical Provider, MD  aspirin EC 81 MG tablet Take  81 mg by mouth daily.   Yes Historical Provider, MD  buPROPion (WELLBUTRIN SR) 150 MG 12 hr tablet Take one tab by mouth every morning & two tabs every evening   Yes Historical Provider, MD  calcitRIOL (ROCALTROL) 0.5 MCG capsule Take 0.5 mcg by mouth daily.   Yes Historical Provider, MD  Cholecalciferol (VITAMIN D PO) Take 1 capsule by mouth daily.   Yes Historical Provider, MD  cinacalcet (SENSIPAR) 30 MG tablet Take 60 mg by mouth daily.    Yes Historical Provider, MD  cloNIDine (CATAPRES - DOSED IN MG/24 HR) 0.2 mg/24hr patch Place 1 patch onto the skin once a week.   Yes Historical Provider, MD  escitalopram (LEXAPRO) 20 MG tablet Take 20 mg by mouth daily.   Yes Historical Provider, MD  furosemide (LASIX) 80 MG tablet Take 80 mg by mouth 2 (two) times daily.   Yes Historical Provider, MD  hydrALAZINE (APRESOLINE) 100 MG tablet Take 100  mg by mouth 2 (two) times daily.   Yes Historical Provider, MD  labetalol (NORMODYNE) 100 MG tablet Take 150 mg by mouth 2 (two) times daily.    Yes Historical Provider, MD  omeprazole (PRILOSEC) 40 MG capsule Take 40 mg by mouth daily.   Yes Historical Provider, MD  oxyCODONE (OXY IR/ROXICODONE) 5 MG immediate release tablet Take 1 tablet (5 mg total) by mouth every 6 (six) hours as needed for severe pain. 11/09/15  Yes Silver Huguenin Elgergawy, MD  pravastatin (PRAVACHOL) 20 MG tablet Take 20 mg by mouth daily.   Yes Historical Provider, MD  saccharomyces boulardii (FLORASTOR) 250 MG capsule Take 1 capsule (250 mg total) by mouth 2 (two) times daily. 11/09/15  Yes Albertine Patricia, MD  amoxicillin-clavulanate (AUGMENTIN) 500-125 MG tablet Take 1 tablet (500 mg total) by mouth daily. Patient not taking: Reported on 11/15/2015 11/09/15   Albertine Patricia, MD   Liver Function Tests  Recent Labs Lab 11/15/15 1202 11/16/15 0239 11/16/15 0800 11/17/15 0323  AST 34 36  --  34  ALT 38 43  --  46  ALKPHOS 220* 236*  --  291*  BILITOT 0.8 0.9  --  1.2  PROT 5.3* 5.6*  --  6.2*  ALBUMIN 2.1* 2.0* 2.1* 2.1*    Recent Labs Lab 11/15/15 1202  LIPASE 85*   CBC  Recent Labs Lab 11/15/15 1202 11/16/15 0239 11/16/15 0801 11/17/15 0323  WBC 27.2* 17.5* 16.4* 17.3*  NEUTROABS 24.3*  --   --   --   HGB 6.9* 8.3* 8.2* 8.4*  HCT 20.6* 25.6* 25.1* 26.7*  MCV 80.5 80.5 81.8 79.7  PLT 575* 637* 603* Q000111Q*   Basic Metabolic Panel  Recent Labs Lab 11/15/15 1202 11/16/15 0239 11/16/15 0800 11/17/15 0323  NA 138 139 140 141  K 3.2* 3.3* 3.6 3.4*  CL 96* 98* 98* 98*  CO2 24 23 23 24   GLUCOSE 96 79 90 88  BUN 55* 61* 65* 75*  CREATININE 8.79* 9.47* 9.65* 11.12*  CALCIUM 7.8* 7.3* 7.4* 7.6*  PHOS  --   --  10.0*  --     Filed Vitals:   11/17/15 0730 11/17/15 0800 11/17/15 0830 11/17/15 0839  BP: 182/92 173/89 148/91 163/81  Pulse: 101 99 99 100  Temp:      TempSrc:      Resp:       Height:      Weight:      SpO2:       Exam Alert  no distress No rash, cyanosis or gangrene Sclera anicteric, throat clear  No jvd or bruits Chest clear bilat RRR no MRG Abd soft ntnd no mass or ascites +bs GU normal male MS no joint effusions or deformity Ext no LE edema / no wounds or ulcers Neuro is alert, Ox 3 , nf  Home HD: 2.5-3 hrs 5 days per week, get orders   Assessment: 1. Anemia , sp recent lap chole, hematoma in GB fossa by CT 2. RLL PNA - by CT on IV abx 3. ESRD HD tomorrow 4. HTN norvasc/ labet/ hydral 5. Hx CVA   Plan - HD tomorrow  Kelly Splinter MD [facility] pager [phone]    cell [phone] 11/16/2015, 2:35 PM

## 2015-11-17 NOTE — Progress Notes (Signed)
[facility] Progress Note  Interval History: [name] is a 45 Y/O patient with ESRD on home hemodialysis followed by Dr. Moshe Cipro. He was admitted 11/04/2015 with community-acquired PNA and acute cholecystitis.  He had laparoscopic choleycystectomy per Dr. Rosendo Gros 11/06/15 and [place] home on 11/09/2015 with JP drain. He returned to [facility] ED 11/15/2015 with increased flank pain and weakness. WBC was 27.2 HGB 6.9. CT of abdomen revealed 12 cm hematoma in gallbladder fossa, RLL pneumonia. Surgery was consulted, patient has been started on Vanc & zosyn per primary. Patient received two units PRBCs for acute blood loss anemia.    Subjective: "I just don't have an appetite, my stomach feels tighter and I'm below my dry wt. Nothing is coming out of that drain either....". Seen on hemodialysis. Patient denies SOB/fevers/chills, C/O nausea-no vomiting.   Objective Filed Vitals:   11/17/15 0730 11/17/15 0800 11/17/15 0830 11/17/15 0839  BP: 182/92 173/89 148/91 163/81  Pulse: 101 99 99 100  Temp:      TempSrc:      Resp:      Height:      Weight:      SpO2:       Physical Exam General: Well nourished, cooperative Heart: S1,S2, RRR No M/R/G Lungs: Bilateral breath sounds slightly decreased RLL no WOB.  Abdomen: distended with active BS. JP drain RUQ with minimal serous drainage.  Extremities: No LE edema Dialysis Access: LFA AVF cannulated at present    Additional Objective Labs: Basic Metabolic Panel:  Recent Labs Lab 11/16/15 0239 11/16/15 0800 11/17/15 0323  NA 139 140 141  K 3.3* 3.6 3.4*  CL 98* 98* 98*  CO2 23 23 24   GLUCOSE 79 90 88  BUN 61* 65* 75*  CREATININE 9.47* 9.65* 11.12*  CALCIUM 7.3* 7.4* 7.6*  PHOS  --  10.0*  --    Liver Function Tests:  Recent Labs Lab 11/15/15 1202 11/16/15 0239 11/16/15 0800 11/17/15 0323  AST 34 36  --  34  ALT 38 43  --  46  ALKPHOS 220* 236*  --  291*  BILITOT 0.8 0.9  --  1.2  PROT 5.3* 5.6*  --  6.2*   ALBUMIN 2.1* 2.0* 2.1* 2.1*    Recent Labs Lab 11/15/15 1202  LIPASE 85*   CBC:  Recent Labs Lab 11/15/15 1202 11/16/15 0239 11/16/15 0801 11/17/15 0323  WBC 27.2* 17.5* 16.4* 17.3*  NEUTROABS 24.3*  --   --   --   HGB 6.9* 8.3* 8.2* 8.4*  HCT 20.6* 25.6* 25.1* 26.7*  MCV 80.5 80.5 81.8 79.7  PLT 575* 637* 603* 743*   Blood Culture    Component Value Date/Time   SDES URINE, CLEAN CATCH 11/15/2015 2155   SPECREQUEST NONE 11/15/2015 2155   CULT NO GROWTH < 24 HOURS 11/15/2015 2155   REPTSTATUS PENDING 11/15/2015 2155    Cardiac Enzymes:  Recent Labs Lab 11/15/15 1202 11/15/15 1820 11/16/15 0239  TROPONINI 0.05* 0.06* 0.10*   CBG: No results for input(s): GLUCAP in the last 168 hours. Iron Studies:  Recent Labs  11/15/15 1342  IRON 7*  TIBC 164*  FERRITIN 1444*   @lablastinr3 @ Studies/Results: Ct Abdomen Pelvis Wo Contrast  11/15/2015  CLINICAL DATA:  Anemia and elevated white blood cell count following cholecystectomy on 11/06/2015. Generalized weakness. History of end-stage renal disease and right nephrectomy for cancer. EXAM: CT CHEST, ABDOMEN AND PELVIS WITHOUT CONTRAST TECHNIQUE: Multidetector CT imaging of the chest, abdomen and pelvis was performed following the  standard protocol without IV contrast. COMPARISON:  CT abdomen and pelvis 11/05/2015. Chest radiographs 11/15/2015 and 11/04/2015. FINDINGS: CT CHEST No enlarged axillary, mediastinal, or hilar lymph nodes are identified. There is advanced aortic and coronary artery atherosclerosis. The heart is enlarged. There may be a trace right pleural effusion. Moderate volume right lower lobe consolidation is present with associated air bronchograms. Minimal dependent atelectasis is noted in the left lower lobe. Major airways are patent. No suspicious lytic or blastic osseous lesion is identified. Thoracic spondylosis is noted. CT ABDOMEN AND PELVIS Sequelae of interval cholecystectomy are identified. There is  a hematoma extending from the region of the gallbladder fossa inferiorly along the right hepatic lobe and into the pericolic gutter. This hematoma measures approximately 12 x 6 x 8 cm. A surgical drain courses through the hematoma. There is mild mass effect on the inferior right hepatic lobe. Small volume blood products are also noted more anteriorly and laterally in the right lower quadrant as well as in the left upper quadrant. The spleen, right adrenal gland, and pancreas are unremarkable. 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 There is a nonobstructing calculus in the interpolar right kidney. No left hydronephrosis. Wall thickening involving the duodenum appears improved from the prior CT. Oral contrast is present in multiple loops of proximal small bowel, and there is a similar appearance of mild proximal small bowel dilatation up to 3.5 cm in diameter. Gas is present in more distal small bowel and colon. No evidence of appendicitis. Bladder and prostate are unremarkable. Diffuse atherosclerotic calcification is noted involving the abdominal aorta and its major branch vessels. No enlarged lymph nodes are identified. Disc bulging with annular calcification at L4-5 and L5-S1. IMPRESSION: 1. Interval cholecystectomy with 12 cm hematoma in the gallbladder fossa and about the inferior right hepatic lobe. The surgical drain courses through this hematoma. 2. Persistent mild small bowel dilatation, query ileus. 3. Right lower lobe consolidation consistent with pneumonia. These results will be called to the ordering clinician or representative by the Radiologist Assistant, and communication documented in the PACS or zVision Dashboard. Electronically Signed   By: Logan Bores M.D.   On: 11/15/2015 17:22   Dg Chest 2 View  11/15/2015  CLINICAL DATA:  Followup right lower lobe pneumonia EXAM: CHEST  2 VIEW COMPARISON:  11/04/2015 FINDINGS: Cardiac shadow is stable. The lungs are well aerated bilaterally. The  previously seen right lower lobe pneumonia has nearly completely resolved. There is however a an underlying nodular appearing density which measures approximately 2.5 cm in greatest dimension. Some deformity of the minor fissure is noted in this is suspicious for an underlying mass lesion. CT of the chest is recommended for further evaluation. No acute bony abnormality is noted. No other focal abnormality is seen. IMPRESSION: Air complete resolution of previously seen right lower lobe infiltrate. There is a a residual somewhat nodular appearing density identified. This may simply represent residual infiltrate although the possibility of an underlying lesion could not be totally excluded. CT of the chest with contrast is recommended for further evaluation. Electronically Signed   By: Inez Catalina M.D.   On: 11/15/2015 12:42   Ct Chest Wo Contrast  11/15/2015  CLINICAL DATA:  Anemia and elevated white blood cell count following cholecystectomy on 11/06/2015. Generalized weakness. History of end-stage renal disease and right nephrectomy for cancer. EXAM: CT CHEST, ABDOMEN AND PELVIS WITHOUT CONTRAST TECHNIQUE: Multidetector CT imaging of the chest, abdomen and pelvis was performed following the standard protocol without  IV contrast. COMPARISON:  CT abdomen and pelvis 11/05/2015. Chest radiographs 11/15/2015 and 11/04/2015. FINDINGS: CT CHEST No enlarged axillary, mediastinal, or hilar lymph nodes are identified. There is advanced aortic and coronary artery atherosclerosis. The heart is enlarged. There may be a trace right pleural effusion. Moderate volume right lower lobe consolidation is present with associated air bronchograms. Minimal dependent atelectasis is noted in the left lower lobe. Major airways are patent. No suspicious lytic or blastic osseous lesion is identified. Thoracic spondylosis is noted. CT ABDOMEN AND PELVIS Sequelae of interval cholecystectomy are identified. There is a hematoma extending from  the region of the gallbladder fossa inferiorly along the right hepatic lobe and into the pericolic gutter. This hematoma measures approximately 12 x 6 x 8 cm. A surgical drain courses through the hematoma. There is mild mass effect on the inferior right hepatic lobe. Small volume blood products are also noted more anteriorly and laterally in the right lower quadrant as well as in the left upper quadrant. The spleen, right adrenal gland, and pancreas are unremarkable. 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 There is a nonobstructing calculus in the interpolar right kidney. No left hydronephrosis. Wall thickening involving the duodenum appears improved from the prior CT. Oral contrast is present in multiple loops of proximal small bowel, and there is a similar appearance of mild proximal small bowel dilatation up to 3.5 cm in diameter. Gas is present in more distal small bowel and colon. No evidence of appendicitis. Bladder and prostate are unremarkable. Diffuse atherosclerotic calcification is noted involving the abdominal aorta and its major branch vessels. No enlarged lymph nodes are identified. Disc bulging with annular calcification at L4-5 and L5-S1. IMPRESSION: 1. Interval cholecystectomy with 12 cm hematoma in the gallbladder fossa and about the inferior right hepatic lobe. The surgical drain courses through this hematoma. 2. Persistent mild small bowel dilatation, query ileus. 3. Right lower lobe consolidation consistent with pneumonia. These results will be called to the ordering clinician or representative by the Radiologist Assistant, and communication documented in the PACS or zVision Dashboard. Electronically Signed   By: Logan Bores M.D.   On: 11/15/2015 17:22   Medications:   . sodium chloride   Intravenous Once  . diatrizoate meglumine-sodium  30 mL Oral Once  . furosemide  40 mg Intravenous BID  . pantoprazole  40 mg Oral Daily  . piperacillin-tazobactam (ZOSYN)  IV  2.25 g  Intravenous Q8H  . pravastatin  20 mg Oral q1800  . saccharomyces boulardii  250 mg Oral BID  . sodium chloride flush  3 mL Intravenous Q12H   HD orders: Home Hemodialysis NxStage with PureFlow CAR 170, Therapy Fluid 1.0 K 40 Lactate, Volume per Tx 50 (liters), Flow Fraction 77 %, BFR 500, EDW 108 Heparin: 2000 units per treatment    Assessment/Plan: 1. Anemia: sp prbc's, esa 2. Fevers/ ^^WBC - improving. +RLL HCAP, less likely infected hematoma: per primary. Vanc/zosyn. 2. ESRD - home HD.  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 HTN - resume labet/ cat patch/ norvasc. BP's up 6. Nutrition - Albumin 2.1 Renal diet. Add prostat/renal vits 7. S/P CVA: per primary-cont ASA/Plavix. 8. Vol under dry wt , no vol excess  Rita H. Brown NP-C 11/17/2015, 8:45 AM  [facility] [phone]  Pt seen, examined and agree w A/P as above.  Kelly Splinter MD [employer] pager [phone]    cell [phone] 11/17/2015, 10:31 AM

## 2015-11-17 NOTE — Progress Notes (Addendum)
[facility] PROGRESS NOTE  Hayden [name] [id number] DOB: 18-Mar-1971 DOA: 11/15/2015   PCP: Louis Meckel, MD     Assessment/Plan: Principal Problem:   Abdominal hematoma Active Problems:   Chronic systolic congestive heart failure/EF 40% PER echo 2010   End stage renal disease ([place])   CVA (cerebral infarction)   GERD (gastroesophageal reflux disease)   Hypertension, well controlled   HTN (hypertension)   HCAP (healthcare-associated pneumonia)   Acute blood loss anemia   Hayden [name] is a 45 y.o. male with end-stage renal disease on home hemodialysis, Hypertension, lacunar infarcts in 06/2015, admitted between 5/6 and 5/11 for community-acquired pneumonia as well as acute cholecystitis, status post laparoscopic cholecystectomy by Dr. Rosendo Gros on 11/06/15, discharged home with a JP drain presents to the ER today because of flank pain increased weakness, acute blood loss anemia, saw Dr. Rosendo Gros in the office yesterday who wanted him to continue the JP drain as it was still putting out frank serosanguineous discharge, CT abdomen pelvis was obtained due to significantly elevated white count of 27.2 and the patient was found have 12 cm hematoma in the gallbladder fossa, right lower lobe pneumonia consistent with healthcare associated pneumonia..  General surgery consultation was obtained, patient was ordered 2 units of packed red blood cells for hemoglobin of 6.9, patient also on home hemodialysis Monday through Friday and nephrology consulted   Assessment and plan  right upper quadrant hematoma  Vs  abscess  Status post laparoscopic cholecystectomy by general surgery Dr. Dyann Kief on 11/06/2015, treated with total of 5 days of IV vancomycin during hospital stay,discharged home on 3 more days of Augmentin Presented with  right lower lobe pneumonia, white count has improved significantly from 27.2> 17.3, on vancomycin and Zosyn, day #3, blood culture no growth  so far, due to recent intervention and surgery would continue vancomycin for now Patient has a JP drain in place which has stopped draining  General surgery following Patient may need interventional radiology/repeat imaging  for further evaluation of his right upper quadrant hematoma to rule out abscesses  , defer to general surgery    Chronic systolic congestive heart failure without exacerbation /EF 40% PER echo 2010 Stable,     End stage renal disease (HCC)-Nephrology notified for initiating hemodialysis orders, discussed with Dr Regis Bill , patient does not want to be dialyzed  in the absence of his regular nephrologist, however the patient  was seen by other nephrologists during his most recent admission. Now getting dialyzed Monday Wednesday Friday   CVA (cerebral infarction)-Currently on aspirin and statin   GERD (gastroesophageal reflux disease)-Continue PPI   Hypertension, well controlled   HCAP (healthcare-associated pneumonia)-Started on vancomycin and Zosyn   Acute blood loss anemia-patient transfused with 2 units of packed red blood cells yesterday, hemoglobin improved to 8.4 follow CBC     DVT prophylaxsis SCDs  Code Status:  Full code     Family Communication: Discussed in detail with the patient, all imaging results, lab results explained to the patient   Disposition Plan: Transfer to telemetry, further disposition per surgery      Consultants:  Surgery  Nephrology    Procedures:  None  Antibiotics: Zosyn 5/17 Vancomycin 5/17        HPI/Subjective: Still has some right upper quadrant discomfort, denies any shortness of breath, chills, does have some nausea     Objective: Filed Vitals:   11/17/15 0900 11/17/15 0930 11/17/15 1000 11/17/15 1022  BP: 164/90  171/98 170/89 172/80  Pulse: 94 93 95 96  Temp:    98.9 F (37.2 C)  TempSrc:    Oral  Resp:    20  Height:      Weight:    99 kg (218 lb 4.1 oz)  SpO2:    97%     Intake/Output Summary (Last 24 hours) at 11/17/15 1100 Last data filed at 11/17/15 1022  Gross per 24 hour  Intake    340 ml  Output   1991 ml  Net  -1651 ml    Exam:  Examination:  General exam: Appears calm and comfortable  Respiratory system: Clear to auscultation. Respiratory effort normal. Cardiovascular system: S1 & S2 heard, RRR. No JVD, murmurs, rubs, gallops or clicks. No pedal edema. Gastrointestinal system: Abdomen is nondistended, soft and nontender. No organomegaly or masses felt. Normal bowel sounds heard. Central nervous system: Alert and oriented. No focal neurological deficits. Extremities: Symmetric 5 x 5 power. Skin: No rashes, lesions or ulcers Psychiatry: Judgement and insight appear normal. Mood & affect appropriate.     Data Reviewed: I have personally reviewed following labs and imaging studies  Micro Results Recent Results (from the past 240 hour(s))  Culture, blood (Routine X 2) w Reflex to ID Panel     Status: None (Preliminary result)   Collection Time: 11/15/15  6:10 PM  Result Value Ref Range Status   Specimen Description BLOOD RIGHT ANTECUBITAL  Final   Special Requests BOTTLES DRAWN AEROBIC AND ANAEROBIC 10CC  Final   Culture NO GROWTH < 24 HOURS  Final   Report Status PENDING  Incomplete  Culture, blood (Routine X 2) w Reflex to ID Panel     Status: None (Preliminary result)   Collection Time: 11/15/15  6:20 PM  Result Value Ref Range Status   Specimen Description BLOOD RIGHT ARM  Final   Special Requests BOTTLES DRAWN AEROBIC AND ANAEROBIC 10CC  Final   Culture NO GROWTH < 24 HOURS  Final   Report Status PENDING  Incomplete  MRSA PCR Screening     Status: None   Collection Time: 11/15/15  8:27 PM  Result Value Ref Range Status   MRSA by PCR NEGATIVE NEGATIVE Final    Comment:        The GeneXpert MRSA Assay (FDA approved for NASAL specimens only), is one component of a comprehensive MRSA colonization surveillance program. It is  not intended to diagnose MRSA infection nor to guide or monitor treatment for MRSA infections.   Urine culture     Status: Abnormal   Collection Time: 11/15/15  9:55 PM  Result Value Ref Range Status   Specimen Description URINE, CLEAN CATCH  Final   Special Requests NONE  Final   Culture 3,000 COLONIES/mL INSIGNIFICANT GROWTH (A)  Final   Report Status 11/17/2015 FINAL  Final    Radiology Reports Ct Abdomen Pelvis Wo Contrast  11/15/2015  CLINICAL DATA:  Anemia and elevated white blood cell count following cholecystectomy on 11/06/2015. Generalized weakness. History of end-stage renal disease and right nephrectomy for cancer. EXAM: CT CHEST, ABDOMEN AND PELVIS WITHOUT CONTRAST TECHNIQUE: Multidetector CT imaging of the chest, abdomen and pelvis was performed following the standard protocol without IV contrast. COMPARISON:  CT abdomen and pelvis 11/05/2015. Chest radiographs 11/15/2015 and 11/04/2015. FINDINGS: CT CHEST No enlarged axillary, mediastinal, or hilar lymph nodes are identified. There is advanced aortic and coronary artery atherosclerosis. The heart is enlarged. There may be a trace right pleural effusion. Moderate  volume right lower lobe consolidation is present with associated air bronchograms. Minimal dependent atelectasis is noted in the left lower lobe. Major airways are patent. No suspicious lytic or blastic osseous lesion is identified. Thoracic spondylosis is noted. CT ABDOMEN AND PELVIS Sequelae of interval cholecystectomy are identified. There is a hematoma extending from the region of the gallbladder fossa inferiorly along the right hepatic lobe and into the pericolic gutter. This hematoma measures approximately 12 x 6 x 8 cm. A surgical drain courses through the hematoma. There is mild mass effect on the inferior right hepatic lobe. Small volume blood products are also noted more anteriorly and laterally in the right lower quadrant as well as in the left upper quadrant. The  spleen, right adrenal gland, and pancreas are unremarkable. 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 There is a nonobstructing calculus in the interpolar right kidney. No left hydronephrosis. Wall thickening involving the duodenum appears improved from the prior CT. Oral contrast is present in multiple loops of proximal small bowel, and there is a similar appearance of mild proximal small bowel dilatation up to 3.5 cm in diameter. Gas is present in more distal small bowel and colon. No evidence of appendicitis. Bladder and prostate are unremarkable. Diffuse atherosclerotic calcification is noted involving the abdominal aorta and its major branch vessels. No enlarged lymph nodes are identified. Disc bulging with annular calcification at L4-5 and L5-S1. IMPRESSION: 1. Interval cholecystectomy with 12 cm hematoma in the gallbladder fossa and about the inferior right hepatic lobe. The surgical drain courses through this hematoma. 2. Persistent mild small bowel dilatation, query ileus. 3. Right lower lobe consolidation consistent with pneumonia. These results will be called to the ordering clinician or representative by the Radiologist Assistant, and communication documented in the PACS or zVision Dashboard. Electronically Signed   By: Logan Bores M.D.   On: 11/15/2015 17:22   Ct Abdomen Pelvis Wo Contrast  11/06/2015  CLINICAL DATA:  Right lower quadrant pain.  Fever EXAM: CT ABDOMEN AND PELVIS WITHOUT CONTRAST TECHNIQUE: Multidetector CT imaging of the abdomen and pelvis was performed following the standard protocol without IV contrast. COMPARISON:  CT 05/16/2014 FINDINGS: Lower chest: Right lower lobe infiltrate with air bronchograms. Probable pneumonia. Negative for pleural effusion. Left lung base clear. Hepatobiliary: Unenhanced images of the liver are negative. Markedly distended gallbladder with increased density in the gallbladder lumen suggesting sludge or hemorrhage or cholecystitis. There is mild  edema surrounding the gallbladder neck consistent with acute inflammation. Bile ducts nondilated. Pancreas: Negative Spleen: Negative Adrenals/Urinary Tract: Negative for renal obstruction. 3 mm nonobstructing stone right lower pole. Fatty lesion left adrenal gland is unchanged may represent lipoma or myelo lipoma. Urinary bladder normal. Stomach/Bowel: Oral contrast is present in the stomach and small bowel extending into the colon and rectum. Small bowel mildly distended with air-fluid levels. This may represent ileus or low-grade obstruction. Normal appendix. There is prominent thickening of the duodenum in the second and third portions. There is stranding in the adjacent fat around the duodenum. This could represent inflammatory changes from peptic ulcer disease or duodenitis. Carcinoma cannot be excluded. Duodenal thickening is near the distended gallbladder however these may be two separate processes. Vascular/Lymphatic: Atherosclerotic calcification in the aorta and iliac arteries. No aneurysm. No lymphadenopathy. Reproductive: Negative prostate.  No bladder wall thickening. Other: No free-fluid Musculoskeletal: Disc degeneration and spurring L4-5 and L5-S1. No fracture or bony mass lesion. IMPRESSION: Markedly distended gallbladder with diffusely increased density in the gallbladder. This could represent  acute cholecystitis versus poor functioning of the gallbladder filled with sludge. Thickening of the second and third portions of the duodenum with surrounding edema in the adjacent fat. This may represent peptic ulcer disease or duodenitis. Carcinoma not excluded. Small bowel is mildly distended with air-fluid levels suggesting ileus or partial small obstruction. Oral contrast extends into the rectum Right lower lobe infiltrate, probable pneumonia. Normal appendix Electronically Signed   By: Franchot Gallo M.D.   On: 11/06/2015 08:12   Dg Chest 2 View  11/15/2015  CLINICAL DATA:  Followup right lower  lobe pneumonia EXAM: CHEST  2 VIEW COMPARISON:  11/04/2015 FINDINGS: Cardiac shadow is stable. The lungs are well aerated bilaterally. The previously seen right lower lobe pneumonia has nearly completely resolved. There is however a an underlying nodular appearing density which measures approximately 2.5 cm in greatest dimension. Some deformity of the minor fissure is noted in this is suspicious for an underlying mass lesion. CT of the chest is recommended for further evaluation. No acute bony abnormality is noted. No other focal abnormality is seen. IMPRESSION: Air complete resolution of previously seen right lower lobe infiltrate. There is a a residual somewhat nodular appearing density identified. This may simply represent residual infiltrate although the possibility of an underlying lesion could not be totally excluded. CT of the chest with contrast is recommended for further evaluation. Electronically Signed   By: Inez Catalina M.D.   On: 11/15/2015 12:42   Dg Chest 2 View  11/04/2015  CLINICAL DATA:  Pneumonia. EXAM: CHEST  2 VIEW COMPARISON:  06/20/2015 FINDINGS: Focal airspace disease in the right lower lobe which is new from prior. No cavitation or effusion. Generous heart size without definitive cardiomegaly. Stable aortic and hilar contours. IMPRESSION: Right lower lobe pneumonia. Followup PA and lateral chest X-ray is recommended in 3-4 weeks following trial of antibiotic therapy to ensure resolution. Electronically Signed   By: Monte Fantasia M.D.   On: 11/04/2015 10:14   Dg Abd 1 View  11/04/2015  CLINICAL DATA:  Two day history of abdominal pain EXAM: ABDOMEN - 1 VIEW COMPARISON:  CT abdomen and pelvis May 16, 2014 FINDINGS: There is no appreciable bowel dilatation or air-fluid level suggesting obstruction. No free air is seen on this supine examination. There are vascular calcifications in the pelvis. IMPRESSION: No demonstrable bowel obstruction or free air. Electronically Signed   By:  Lowella Grip III M.D.   On: 11/04/2015 10:13   Ct Chest Wo Contrast  11/15/2015  CLINICAL DATA:  Anemia and elevated white blood cell count following cholecystectomy on 11/06/2015. Generalized weakness. History of end-stage renal disease and right nephrectomy for cancer. EXAM: CT CHEST, ABDOMEN AND PELVIS WITHOUT CONTRAST TECHNIQUE: Multidetector CT imaging of the chest, abdomen and pelvis was performed following the standard protocol without IV contrast. COMPARISON:  CT abdomen and pelvis 11/05/2015. Chest radiographs 11/15/2015 and 11/04/2015. FINDINGS: CT CHEST No enlarged axillary, mediastinal, or hilar lymph nodes are identified. There is advanced aortic and coronary artery atherosclerosis. The heart is enlarged. There may be a trace right pleural effusion. Moderate volume right lower lobe consolidation is present with associated air bronchograms. Minimal dependent atelectasis is noted in the left lower lobe. Major airways are patent. No suspicious lytic or blastic osseous lesion is identified. Thoracic spondylosis is noted. CT ABDOMEN AND PELVIS Sequelae of interval cholecystectomy are identified. There is a hematoma extending from the region of the gallbladder fossa inferiorly along the right hepatic lobe and into the pericolic gutter. This  hematoma measures approximately 12 x 6 x 8 cm. A surgical drain courses through the hematoma. There is mild mass effect on the inferior right hepatic lobe. Small volume blood products are also noted more anteriorly and laterally in the right lower quadrant as well as in the left upper quadrant. The spleen, right adrenal gland, and pancreas are unremarkable. 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 There is a nonobstructing calculus in the interpolar right kidney. No left hydronephrosis. Wall thickening involving the duodenum appears improved from the prior CT. Oral contrast is present in multiple loops of proximal small bowel, and there is a similar  appearance of mild proximal small bowel dilatation up to 3.5 cm in diameter. Gas is present in more distal small bowel and colon. No evidence of appendicitis. Bladder and prostate are unremarkable. Diffuse atherosclerotic calcification is noted involving the abdominal aorta and its major branch vessels. No enlarged lymph nodes are identified. Disc bulging with annular calcification at L4-5 and L5-S1. IMPRESSION: 1. Interval cholecystectomy with 12 cm hematoma in the gallbladder fossa and about the inferior right hepatic lobe. The surgical drain courses through this hematoma. 2. Persistent mild small bowel dilatation, query ileus. 3. Right lower lobe consolidation consistent with pneumonia. These results will be called to the ordering clinician or representative by the Radiologist Assistant, and communication documented in the PACS or zVision Dashboard. Electronically Signed   By: Logan Bores M.D.   On: 11/15/2015 17:22   US Abdomen Limited Ruq  11/06/2015  CLINICAL DATA:  Right upper quadrant pain for 4 days EXAM: US ABDOMEN LIMITED - RIGHT UPPER QUADRANT COMPARISON:  CT abdomen and pelvis Nov 05, 2015 FINDINGS: Gallbladder: Gallbladder appears distended with wall thickening and wall edema. There is sludge in the gallbladder with small intermingled gallstones. The patient is focally tender over the gallbladder. There is equivocal pericholecystic fluid. Common bile duct: Diameter: 4 mm. There is no intrahepatic or extrahepatic biliary duct dilatation. Liver: No focal lesion identified. Within normal limits in parenchymal echogenicity. IMPRESSION: Findings as described above consistent with a degree of acute cholecystitis. These results will be called to the ordering clinician or representative by the Radiologist Assistant, and communication documented in the PACS or zVision Dashboard. Electronically Signed   By: Lowella Grip III M.D.   On: 11/06/2015 10:10     CBC  Recent Labs Lab 11/15/15 1202  11/16/15 0239 11/16/15 0801 11/17/15 0323  WBC 27.2* 17.5* 16.4* 17.3*  HGB 6.9* 8.3* 8.2* 8.4*  HCT 20.6* 25.6* 25.1* 26.7*  PLT 575* 637* 603* 743*  MCV 80.5 80.5 81.8 79.7  MCH 25.8* 26.1 26.7 25.1*  MCHC 32.0 32.4 32.7 31.5  RDW 17.8* 16.8* 17.0* 17.0*  LYMPHSABS 0.8  --   --   --   MONOABS 1.6*  --   --   --   EOSABS 0.5  --   --   --   BASOSABS 0.0  --   --   --     Chemistries   Recent Labs Lab 11/15/15 1202 11/15/15 1820 11/16/15 0239 11/16/15 0800 11/17/15 0323  NA 138  --  139 140 141  K 3.2*  --  3.3* 3.6 3.4*  CL 96*  --  98* 98* 98*  CO2 24  --  23 23 24   GLUCOSE 96  --  79 90 88  BUN 55*  --  61* 65* 75*  CREATININE 8.79*  --  9.47* 9.65* 11.12*  CALCIUM 7.8*  --  7.3*  7.4* 7.6*  MG  --  2.2  --   --   --   AST 34  --  36  --  34  ALT 38  --  43  --  46  ALKPHOS 220*  --  236*  --  291*  BILITOT 0.8  --  0.9  --  1.2   ------------------------------------------------------------------------------------------------------------------ estimated creatinine clearance is 10.1 mL/min (by C-G formula based on Cr of 11.12). ------------------------------------------------------------------------------------------------------------------ No results for input(s): HGBA1C in the last 72 hours. ------------------------------------------------------------------------------------------------------------------ No results for input(s): CHOL, HDL, LDLCALC, TRIG, CHOLHDL, LDLDIRECT in the last 72 hours. ------------------------------------------------------------------------------------------------------------------ No results for input(s): TSH, T4TOTAL, T3FREE, THYROIDAB in the last 72 hours.  Invalid input(s): FREET3 ------------------------------------------------------------------------------------------------------------------  Recent Labs  11/15/15 1342  VITAMINB12 712  FOLATE 3.4*  FERRITIN 1444*  TIBC 164*  IRON 7*  RETICCTPCT 2.4    Coagulation  profile No results for input(s): INR, PROTIME in the last 168 hours.  No results for input(s): DDIMER in the last 72 hours.  Cardiac Enzymes  Recent Labs Lab 11/15/15 1202 11/15/15 1820 11/16/15 0239  TROPONINI 0.05* 0.06* 0.10*   ------------------------------------------------------------------------------------------------------------------ Invalid input(s): POCBNP   CBG: No results for input(s): GLUCAP in the last 168 hours.     Studies: Ct Abdomen Pelvis Wo Contrast  11/15/2015  CLINICAL DATA:  Anemia and elevated white blood cell count following cholecystectomy on 11/06/2015. Generalized weakness. History of end-stage renal disease and right nephrectomy for cancer. EXAM: CT CHEST, ABDOMEN AND PELVIS WITHOUT CONTRAST TECHNIQUE: Multidetector CT imaging of the chest, abdomen and pelvis was performed following the standard protocol without IV contrast. COMPARISON:  CT abdomen and pelvis 11/05/2015. Chest radiographs 11/15/2015 and 11/04/2015. FINDINGS: CT CHEST No enlarged axillary, mediastinal, or hilar lymph nodes are identified. There is advanced aortic and coronary artery atherosclerosis. The heart is enlarged. There may be a trace right pleural effusion. Moderate volume right lower lobe consolidation is present with associated air bronchograms. Minimal dependent atelectasis is noted in the left lower lobe. Major airways are patent. No suspicious lytic or blastic osseous lesion is identified. Thoracic spondylosis is noted. CT ABDOMEN AND PELVIS Sequelae of interval cholecystectomy are identified. There is a hematoma extending from the region of the gallbladder fossa inferiorly along the right hepatic lobe and into the pericolic gutter. This hematoma measures approximately 12 x 6 x 8 cm. A surgical drain courses through the hematoma. There is mild mass effect on the inferior right hepatic lobe. Small volume blood products are also noted more anteriorly and laterally in the right lower  quadrant as well as in the left upper quadrant. The spleen, right adrenal gland, and pancreas are unremarkable. 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 There is a nonobstructing calculus in the interpolar right kidney. No left hydronephrosis. Wall thickening involving the duodenum appears improved from the prior CT. Oral contrast is present in multiple loops of proximal small bowel, and there is a similar appearance of mild proximal small bowel dilatation up to 3.5 cm in diameter. Gas is present in more distal small bowel and colon. No evidence of appendicitis. Bladder and prostate are unremarkable. Diffuse atherosclerotic calcification is noted involving the abdominal aorta and its major branch vessels. No enlarged lymph nodes are identified. Disc bulging with annular calcification at L4-5 and L5-S1. IMPRESSION: 1. Interval cholecystectomy with 12 cm hematoma in the gallbladder fossa and about the inferior right hepatic lobe. The surgical drain courses through this hematoma. 2. Persistent mild small bowel dilatation, query  ileus. 3. Right lower lobe consolidation consistent with pneumonia. These results will be called to the ordering clinician or representative by the Radiologist Assistant, and communication documented in the PACS or zVision Dashboard. Electronically Signed   By: Sebastian Ache M.D.   On: 11/15/2015 17:22   Dg Chest 2 View  11/15/2015  CLINICAL DATA:  Followup right lower lobe pneumonia EXAM: CHEST  2 VIEW COMPARISON:  11/04/2015 FINDINGS: Cardiac shadow is stable. The lungs are well aerated bilaterally. The previously seen right lower lobe pneumonia has nearly completely resolved. There is however a an underlying nodular appearing density which measures approximately 2.5 cm in greatest dimension. Some deformity of the minor fissure is noted in this is suspicious for an underlying mass lesion. CT of the chest is recommended for further evaluation. No acute bony abnormality is noted. No  other focal abnormality is seen. IMPRESSION: Air complete resolution of previously seen right lower lobe infiltrate. There is a a residual somewhat nodular appearing density identified. This may simply represent residual infiltrate although the possibility of an underlying lesion could not be totally excluded. CT of the chest with contrast is recommended for further evaluation. Electronically Signed   By: Alcide Clever M.D.   On: 11/15/2015 12:42   Ct Chest Wo Contrast  11/15/2015  CLINICAL DATA:  Anemia and elevated white blood cell count following cholecystectomy on 11/06/2015. Generalized weakness. History of end-stage renal disease and right nephrectomy for cancer. EXAM: CT CHEST, ABDOMEN AND PELVIS WITHOUT CONTRAST TECHNIQUE: Multidetector CT imaging of the chest, abdomen and pelvis was performed following the standard protocol without IV contrast. COMPARISON:  CT abdomen and pelvis 11/05/2015. Chest radiographs 11/15/2015 and 11/04/2015. FINDINGS: CT CHEST No enlarged axillary, mediastinal, or hilar lymph nodes are identified. There is advanced aortic and coronary artery atherosclerosis. The heart is enlarged. There may be a trace right pleural effusion. Moderate volume right lower lobe consolidation is present with associated air bronchograms. Minimal dependent atelectasis is noted in the left lower lobe. Major airways are patent. No suspicious lytic or blastic osseous lesion is identified. Thoracic spondylosis is noted. CT ABDOMEN AND PELVIS Sequelae of interval cholecystectomy are identified. There is a hematoma extending from the region of the gallbladder fossa inferiorly along the right hepatic lobe and into the pericolic gutter. This hematoma measures approximately 12 x 6 x 8 cm. A surgical drain courses through the hematoma. There is mild mass effect on the inferior right hepatic lobe. Small volume blood products are also noted more anteriorly and laterally in the right lower quadrant as well as in  the left upper quadrant. The spleen, right adrenal gland, and pancreas are unremarkable. 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 There is a nonobstructing calculus in the interpolar right kidney. No left hydronephrosis. Wall thickening involving the duodenum appears improved from the prior CT. Oral contrast is present in multiple loops of proximal small bowel, and there is a similar appearance of mild proximal small bowel dilatation up to 3.5 cm in diameter. Gas is present in more distal small bowel and colon. No evidence of appendicitis. Bladder and prostate are unremarkable. Diffuse atherosclerotic calcification is noted involving the abdominal aorta and its major branch vessels. No enlarged lymph nodes are identified. Disc bulging with annular calcification at L4-5 and L5-S1. IMPRESSION: 1. Interval cholecystectomy with 12 cm hematoma in the gallbladder fossa and about the inferior right hepatic lobe. The surgical drain courses through this hematoma. 2. Persistent mild small bowel dilatation, query ileus. 3. Right  lower lobe consolidation consistent with pneumonia. These results will be called to the ordering clinician or representative by the Radiologist Assistant, and communication documented in the PACS or zVision Dashboard. Electronically Signed   By: Logan Bores M.D.   On: 11/15/2015 17:22      Lab Results  Component Value Date   HGBA1C 5.4 06/20/2015   HGBA1C 9.9* 09/22/2012   HGBA1C * 12/22/2008    11.5 (NOTE) The ADA recommends the following therapeutic goal for glycemic control related to Hgb A1c measurement: Goal of therapy: <6.5 Hgb A1c  Reference: American Diabetes Association: Clinical Practice Recommendations 2010, Diabetes Care, 2010, 33: (Suppl  1).   Lab Results  Component Value Date   LDLCALC 125* 06/20/2015   CREATININE 11.12* 11/17/2015       Scheduled Meds: . sodium chloride   Intravenous Once  . amLODipine  10 mg Oral QHS  . calcium acetate  1,334 mg Oral  TID WC  . cloNIDine  0.2 mg Transdermal Weekly  . [START ON 11/20/2015] darbepoetin (ARANESP) injection - DIALYSIS  200 mcg Intravenous Q Mon-HD  . diatrizoate meglumine-sodium  30 mL Oral Once  . furosemide  40 mg Intravenous BID  . labetalol  150 mg Oral BID  . pantoprazole  40 mg Oral Daily  . piperacillin-tazobactam (ZOSYN)  IV  2.25 g Intravenous Q8H  . pravastatin  20 mg Oral q1800  . saccharomyces boulardii  250 mg Oral BID  . sodium chloride flush  3 mL Intravenous Q12H  . vancomycin  1,000 mg Intravenous Q M,W,F-HD   Continuous Infusions:    LOS: 2 days    Time spent: >30 MINS    [facility]  [name] Pager [phone]. If 7PM-7AM, please contact night-coverage at [email or web address], password [facility] 11/17/2015, 11:00 AM  LOS: 2 days

## 2015-11-17 NOTE — Progress Notes (Signed)
  Pt admitted to the unit. Pt is stable, alert and oriented per baseline. Oriented to room, staff, and call bell. Educated to call for any assistance. Bed in lowest position, call bell within reach- will continue to monitor. 

## 2015-11-17 NOTE — Progress Notes (Signed)
Pharmacy Antibiotic Note  Hayden [name] is a 45 y.o. male admitted on 11/15/2015 with pneumonia.  Pharmacy has been consulted for vancomycin and zosyn dosing. Pt does at home dialysis Monday through Friday with his last session this morning for 3h and 87min. WBC improved but still elevated, afebrile. CXR w/ RLL PNA.   Plan: Vancomycin 1g IV qHD Zosyn 2.25g IV q8h Monitor culture data, HD plans and clinical course Vancomycin preHD target level 20-25 mcg/mL VT at SS prn  Height: 5\' 10"  (177.8 cm) Weight: 218 lb 4.1 oz (99 kg) IBW/kg (Calculated) : 73  Temp (24hrs), Avg:98.5 F (36.9 C), Min:98.2 F (36.8 C), Max:98.9 F (37.2 C)   Recent Labs Lab 11/15/15 1202 11/16/15 0239 11/16/15 0800 11/16/15 0801 11/17/15 0323  WBC 27.2* 17.5*  --  16.4* 17.3*  CREATININE 8.79* 9.47* 9.65*  --  11.12*    Estimated Creatinine Clearance: 10.1 mL/min (by C-G formula based on Cr of 11.12).    Allergies  Allergen Reactions  . Dilaudid [Hydromorphone Hcl] Other (See Comments)    Abnormal behavior, "verbal and physically abusive", "psychosis"  . Morphine And Related Itching    Syncopal episode    Antimicrobials this admission: Vanc 5/17 >>  Zosyn 5/17 >>   Dose adjustments this admission: n/a  Microbiology results:  BCx: NGTD  UCx:  Insignificant growth  MRSA PCR: negative  Joya San, PharmD Clinical Pharmacy Resident Pager # [phone] 11/17/2015 11:09 AM

## 2015-11-18 DIAGNOSIS — K219 Gastro-esophageal reflux disease without esophagitis: Secondary | ICD-10-CM

## 2015-11-18 DIAGNOSIS — S3690XD Unspecified injury of unspecified intra-abdominal organ, subsequent encounter: Secondary | ICD-10-CM

## 2015-11-18 DIAGNOSIS — I10 Essential (primary) hypertension: Secondary | ICD-10-CM

## 2015-11-18 DIAGNOSIS — J189 Pneumonia, unspecified organism: Secondary | ICD-10-CM

## 2015-11-18 LAB — COMPREHENSIVE METABOLIC PANEL
ALT: 48 U/L (ref 17–63)
AST: 37 U/L (ref 15–41)
Albumin: 1.9 g/dL — ABNORMAL LOW (ref 3.5–5.0)
Alkaline Phosphatase: 252 U/L — ABNORMAL HIGH (ref 38–126)
Anion gap: 18 — ABNORMAL HIGH (ref 5–15)
BUN: 46 mg/dL — AB (ref 6–20)
CHLORIDE: 98 mmol/L — AB (ref 101–111)
CO2: 26 mmol/L (ref 22–32)
Calcium: 8 mg/dL — ABNORMAL LOW (ref 8.9–10.3)
Creatinine, Ser: 7.25 mg/dL — ABNORMAL HIGH (ref 0.61–1.24)
GFR calc Af Amer: 10 mL/min — ABNORMAL LOW (ref >60)
GFR calc non Af Amer: 8 mL/min — ABNORMAL LOW (ref >60)
GLUCOSE: 109 mg/dL — AB (ref 65–99)
POTASSIUM: 3.8 mmol/L (ref 3.5–5.1)
SODIUM: 142 mmol/L (ref 135–145)
Total Bilirubin: 0.6 mg/dL (ref 0.3–1.2)
Total Protein: 5.8 g/dL — ABNORMAL LOW (ref 6.5–8.1)

## 2015-11-18 LAB — CBC
HCT: 25.3 % — ABNORMAL LOW (ref 39.0–52.0)
Hemoglobin: 7.9 g/dL — ABNORMAL LOW (ref 13.0–17.0)
MCH: 25.6 pg — ABNORMAL LOW (ref 26.0–34.0)
MCHC: 31.2 g/dL (ref 30.0–36.0)
MCV: 81.9 fL (ref 78.0–100.0)
Platelets: 733 K/uL — ABNORMAL HIGH (ref 150–400)
RBC: 3.09 MIL/uL — ABNORMAL LOW (ref 4.22–5.81)
RDW: 17.1 % — ABNORMAL HIGH (ref 11.5–15.5)
WBC: 10.3 K/uL (ref 4.0–10.5)

## 2015-11-18 MED ORDER — FUROSEMIDE 80 MG PO TABS
80.0000 mg | ORAL_TABLET | Freq: Two times a day (BID) | ORAL | Status: DC
  Administered 2015-11-18 – 2015-11-20 (×4): 80 mg via ORAL
  Filled 2015-11-18 (×4): qty 1

## 2015-11-18 MED ORDER — BUPROPION HCL ER (SR) 150 MG PO TB12
300.0000 mg | ORAL_TABLET | Freq: Every day | ORAL | Status: DC
  Administered 2015-11-18 – 2015-11-19 (×2): 300 mg via ORAL
  Filled 2015-11-18 (×3): qty 2

## 2015-11-18 MED ORDER — BUPROPION HCL ER (SR) 150 MG PO TB12
150.0000 mg | ORAL_TABLET | Freq: Every day | ORAL | Status: DC
  Administered 2015-11-19 – 2015-11-20 (×2): 150 mg via ORAL
  Filled 2015-11-18 (×2): qty 1

## 2015-11-18 MED ORDER — CINACALCET HCL 30 MG PO TABS
60.0000 mg | ORAL_TABLET | Freq: Every day | ORAL | Status: DC
  Administered 2015-11-18 – 2015-11-20 (×3): 60 mg via ORAL
  Filled 2015-11-18 (×3): qty 2

## 2015-11-18 MED ORDER — ESCITALOPRAM OXALATE 20 MG PO TABS
20.0000 mg | ORAL_TABLET | Freq: Every day | ORAL | Status: DC
  Administered 2015-11-18 – 2015-11-20 (×3): 20 mg via ORAL
  Filled 2015-11-18 (×3): qty 1

## 2015-11-18 MED ORDER — CALCITRIOL 0.5 MCG PO CAPS
0.5000 ug | ORAL_CAPSULE | Freq: Every day | ORAL | Status: DC
  Administered 2015-11-18 – 2015-11-20 (×3): 0.5 ug via ORAL
  Filled 2015-11-18 (×3): qty 1

## 2015-11-18 NOTE — Progress Notes (Signed)
[name] Progress Note   Subjective: Chills and fevers gradually getting better.  No output from drain again today.  Says he feels "much better".    Objective Filed Vitals:   11/17/15 1724 11/17/15 2028 11/18/15 0437 11/18/15 0948  BP: 132/78 154/70 151/65 163/66  Pulse: 93 90 91 87  Temp: 98.7 F (37.1 C) 99.1 F (37.3 C) 98.2 F (36.8 C) 97.7 F (36.5 C)  TempSrc: Oral Oral Oral Oral  Resp: 18 17 16 16   Height:      Weight:  99.4 kg (219 lb 2.2 oz)    SpO2: 98% 96% 95% 98%   Physical Exam General: Well nourished, cooperative Heart: S1,S2, RRR No M/R/G LUngs: dec'd BS R base, o/w clear Abdomen: distended with active BS. JP drain RUQ with minimal serous drainage.  Extremities: No LE edema Dialysis Access: LFA AVF cannulated at present  HD orders: Home Hemodialysis NxStage with PureFlow CAR 170, Therapy Fluid 1.0 K 40 Lactate, Volume per Tx 50 (liters), Flow Fraction 77 %, BFR 500, EDW 108 Heparin: 2000 units per treatment    Assessment: 1. Anemia: sp prbc's, esa 2. HCAP - vanc/ zosyn, improving daily, wbc down 2. ESRD - home HD.  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 HTN - resume labet/ cat patch/ norvasc. BP's up 6. Nutrition - Albumin 2.1 Renal diet. Add prostat/renal vits 7. S/P CVA: per primary-cont ASA/Plavix. 8. Vol under dry wt , no vol excess - keep even w HD  P - HD Monday  Kelly Splinter MD [facility] pager [phone]    cell [phone] 11/18/2015, 11:25 AM   Additional Objective Labs: Basic Metabolic Panel:  Recent Labs Lab 11/16/15 0800 11/17/15 0323 11/18/15 0540  NA 140 141 142  K 3.6 3.4* 3.8  CL 98* 98* 98*  CO2 23 24 26   GLUCOSE 90 88 109*  BUN 65* 75* 46*  CREATININE 9.65* 11.12* 7.25*  CALCIUM 7.4* 7.6* 8.0*  PHOS 10.0*  --   --    Liver Function Tests:  Recent Labs Lab 11/16/15 0239 11/16/15 0800 11/17/15 0323 11/18/15 0540  AST 36  --  34 37  ALT 43  --  46 48  ALKPHOS 236*  --  291*  252*  BILITOT 0.9  --  1.2 0.6  PROT 5.6*  --  6.2* 5.8*  ALBUMIN 2.0* 2.1* 2.1* 1.9*    Recent Labs Lab 11/15/15 1202  LIPASE 85*   CBC:  Recent Labs Lab 11/15/15 1202 11/16/15 0239 11/16/15 0801 11/17/15 0323 11/18/15 0540  WBC 27.2* 17.5* 16.4* 17.3* 10.3  NEUTROABS 24.3*  --   --   --   --   HGB 6.9* 8.3* 8.2* 8.4* 7.9*  HCT 20.6* 25.6* 25.1* 26.7* 25.3*  MCV 80.5 80.5 81.8 79.7 81.9  PLT 575* 637* 603* 743* 733*   Blood Culture    Component Value Date/Time   SDES URINE, CLEAN CATCH 11/15/2015 2155   SPECREQUEST NONE 11/15/2015 2155   CULT 3,000 COLONIES/mL INSIGNIFICANT GROWTH* 11/15/2015 2155   REPTSTATUS 11/17/2015 FINAL 11/15/2015 2155    Cardiac Enzymes:  Recent Labs Lab 11/15/15 1202 11/15/15 1820 11/16/15 0239  TROPONINI 0.05* 0.06* 0.10*   CBG: No results for input(s): GLUCAP in the last 168 hours. Iron Studies:   Recent Labs  11/15/15 1342  IRON 7*  TIBC 164*  FERRITIN 1444*   @[email or web address] @ Studies/Results: No results found. Medications:   . sodium chloride   Intravenous Once  . amLODipine  10 mg Oral QHS  . calcium acetate  1,334 mg Oral TID WC  . cloNIDine  0.2 mg Transdermal Weekly  . [START ON 11/20/2015] darbepoetin (ARANESP) injection - DIALYSIS  200 mcg Intravenous Q Mon-HD  . diatrizoate meglumine-sodium  30 mL Oral Once  . furosemide  40 mg Intravenous BID  . labetalol  150 mg Oral BID  . pantoprazole  40 mg Oral Daily  . piperacillin-tazobactam (ZOSYN)  IV  2.25 g Intravenous Q8H  . pravastatin  20 mg Oral q1800  . saccharomyces boulardii  250 mg Oral BID  . sodium chloride flush  3 mL Intravenous Q12H  . vancomycin  1,000 mg Intravenous Q M,W,F-HD

## 2015-11-18 NOTE — Progress Notes (Signed)
PROGRESS NOTE  Hayden [name]  [id number] DOB: [birth year]  DOA: 11/15/2015 PCP: Louis Meckel, MD   Brief Narrative:  45 year old male with a PMH of ESRD on HD, HTN, CVA, HLD, anemia, recent treatment for CAP hospitalized 11/04/15-11/09/15 for acute cholecystitis and underwent laparoscopic cholecystectomy by Dr. Rosendo Gros on 11/06/15, completed 5 days of inpatient IV Zosyn & vancomycin and was to finish 3 days of outpatient Augmentin, discharged with JP drain and outpatient surgical follow-up, now presented to the ED because of flank pain. CT abdomen showed pulse centimeter hematoma in the gallbladder fossa and right lower lobe pneumonia consistent with HCAP. Transfused 2 units PRBC for hemoglobin 6.9. General surgery and nephrology consulting. Improving.   Assessment & Plan:   Principal Problem:   Abdominal hematoma Active Problems:   Chronic systolic congestive heart failure/EF 40% PER echo 2010   End stage renal disease (HCC)   CVA (cerebral infarction)   GERD (gastroesophageal reflux disease)   Hypertension, well controlled   HTN (hypertension)   HCAP (healthcare-associated pneumonia)   Acute blood loss anemia   RUQ hematoma, S/P upper scope cholecystectomy 11/06/15. - hospitalized 11/04/15-11/09/15 for acute cholecystitis and underwent laparoscopic cholecystectomy by Dr. Rosendo Gros on 11/05/68, completed 5 days of inpatient IV Zosyn & vancomycin and was to finish 3 days of outpatient Augmentin, discharged with JP drain and outpatient surgical follow-up, now presented to the ED because of flank pain.  - CT abdomen showed pulse centimeter hematoma in the gallbladder fossa and right lower lobe pneumonia consistent with HCAP. - Remains on IV vancomycin and Zosyn. Afebrile. Leukocytosis has resolved. Blood cultures 2: Negative to date. Urine culture: Insignificant growth. - JP drain is not draining. - As per surgical follow-up: Continued pain, may go home with drain and follow-up with Dr.  Rosendo Gros next week and stable for DC from surgical standpoint. - Hemoglobin stable.  Healthcare associated pneumonia, right lower lobe - Patient has been on some form of antibiotics since 5/6 (IV Zosyn 5/6 > 5/10, azithromycin 1 on 5/7, vancomycin 1 on 5/9, PO Augmentin 3 days at discharge, IV Zosyn 5/17 >and IV vancomycin 5/17 >) - Consider discontinuing all antibiotics versus transitioning to antibiotics for additional 2-3 days and stopping. - Recommend repeating chest x-ray in 3-4 weeks to ensure resolution of pneumonia findings.  Acute blood loss anemia - Status post transfusion 2 units PRBC. - Hemoglobin relatively stable. Follow CBCs.  ESRD on home HD - Nephrology following and on MWF HD here. Discussed with nephrology. Also on IV Lasix-discussed with nephrology regarding stopping.  Essential hypertension - Mildly uncontrolled. Continue labetalol, Catapres and amlodipine.  Status post CVA - Patient was on aspirin PTA-currently held. Discussed with surgical team regarding resumption.  Chronic systolic CHF - LVEF [id number] per echo 2010. Volume management across dialysis. Compensated.  GERD - PPI.   DVT prophylaxis: SCDs Code Status: Full Family Communication: Discussed with patient's son and parents-in-laws at bedside on 5/20 Disposition Plan: DC home possibly in the next 1-2 days.   Consultants:   General surgery  Nephrology  Procedures:   RUQ JP drain-present prior to admission  HD  Antimicrobials:   IV vancomycin  IV Zosyn    Subjective: Feels much better. Denies pain, dyspnea or cough. Had BM on 5/19. Tolerating diet well.  Objective:  Filed Vitals:   11/17/15 1724 11/17/15 2028 11/18/15 0437 11/18/15 0948  BP: 132/78 154/70 151/65 163/66  Pulse: 93 90 91 87  Temp: 98.7 F (37.1 C) 99.1 F (37.3 C) 98.2 F (  36.8 C) 97.7 F (36.5 C)  TempSrc: Oral Oral Oral Oral  Resp: 18 17 16 16   Height:      Weight:  99.4 kg (219 lb 2.2 oz)    SpO2: 98%  96% 95% 98%    Intake/Output Summary (Last 24 hours) at 11/18/15 1626 Last data filed at 11/18/15 1300  Gross per 24 hour  Intake    720 ml  Output      0 ml  Net    720 ml   Filed Weights   11/17/15 0640 11/17/15 1022 11/17/15 2028  Weight: 100.6 kg (221 lb 12.5 oz) 99 kg (218 lb 4.1 oz) 99.4 kg (219 lb 2.2 oz)    Examination:  General exam: Pleasant young male lying comfortably propped up in bed. Respiratory system: Clear to auscultation. Respiratory effort normal. Cardiovascular system: S1 & S2 heard, RRR. No JVD, murmurs, rubs, gallops or clicks. No pedal edema. Telemetry: SR Gastrointestinal system: Abdomen is nondistended, soft and nontender. No organomegaly or masses felt. Normal bowel sounds heard. JP drain + (no drainage) Central nervous system: Alert and oriented. No focal neurological deficits. Extremities: Symmetric 5 x 5 power. Skin: No rashes, lesions or ulcers Psychiatry: Judgement and insight appear normal. Mood & affect appropriate.     Data Reviewed: I have personally reviewed following labs and imaging studies  CBC:  Recent Labs Lab 11/15/15 1202 11/16/15 0239 11/16/15 0801 11/17/15 0323 11/18/15 0540  WBC 27.2* 17.5* 16.4* 17.3* 10.3  NEUTROABS 24.3*  --   --   --   --   HGB 6.9* 8.3* 8.2* 8.4* 7.9*  HCT 20.6* 25.6* 25.1* 26.7* 25.3*  MCV 80.5 80.5 81.8 79.7 81.9  PLT 575* 637* 603* 743* [id number]   Basic Metabolic Panel:  Recent Labs Lab 11/15/15 1202 11/15/15 1820 11/16/15 0239 11/16/15 0800 11/17/15 0323 11/18/15 0540  NA 138  --  139 140 141 142  K 3.2*  --  3.3* 3.6 3.4* 3.8  CL 96*  --  98* 98* 98* 98*  CO2 24  --  23 23 24 26   GLUCOSE 96  --  79 90 88 109*  BUN 55*  --  61* 65* 75* 46*  CREATININE 8.79*  --  9.47* 9.65* 11.12* 7.25*  CALCIUM 7.8*  --  7.3* 7.4* 7.6* 8.0*  MG  --  2.2  --   --   --   --   PHOS  --   --   --  10.0*  --   --    GFR: Estimated Creatinine Clearance: 15.5 mL/min (by C-G formula based on Cr of  7.25). Liver Function Tests:  Recent Labs Lab 11/15/15 1202 11/16/15 0239 11/16/15 0800 11/17/15 0323 11/18/15 0540  AST 34 36  --  34 37  ALT 38 43  --  46 48  ALKPHOS 220* 236*  --  291* 252*  BILITOT 0.8 0.9  --  1.2 0.6  PROT 5.3* 5.6*  --  6.2* 5.8*  ALBUMIN 2.1* 2.0* 2.1* 2.1* 1.9*    Recent Labs Lab 11/15/15 1202  LIPASE 85*   No results for input(s): AMMONIA in the last 168 hours. Coagulation Profile: No results for input(s): INR, PROTIME in the last 168 hours. Cardiac Enzymes:  Recent Labs Lab 11/15/15 1202 11/15/15 1820 11/16/15 0239  TROPONINI 0.05* 0.06* 0.10*   BNP (last 3 results) No results for input(s): PROBNP in the last 8760 hours. HbA1C: No results for input(s): HGBA1C in the last 72 hours.  CBG: No results for input(s): GLUCAP in the last 168 hours. Lipid Profile: No results for input(s): CHOL, HDL, LDLCALC, TRIG, CHOLHDL, LDLDIRECT in the last 72 hours. Thyroid Function Tests: No results for input(s): TSH, T4TOTAL, FREET4, T3FREE, THYROIDAB in the last 72 hours. Anemia Panel: No results for input(s): VITAMINB12, FOLATE, FERRITIN, TIBC, IRON, RETICCTPCT in the last 72 hours.  Sepsis Labs: No results for input(s): PROCALCITON, LATICACIDVEN in the last 168 hours.  Recent Results (from the past 240 hour(s))  Culture, blood (Routine X 2) w Reflex to ID Panel     Status: None (Preliminary result)   Collection Time: 11/15/15  6:10 PM  Result Value Ref Range Status   Specimen Description BLOOD RIGHT ANTECUBITAL  Final   Special Requests BOTTLES DRAWN AEROBIC AND ANAEROBIC 10CC  Final   Culture NO GROWTH 3 DAYS  Final   Report Status PENDING  Incomplete  Culture, blood (Routine X 2) w Reflex to ID Panel     Status: None (Preliminary result)   Collection Time: 11/15/15  6:20 PM  Result Value Ref Range Status   Specimen Description BLOOD RIGHT ARM  Final   Special Requests BOTTLES DRAWN AEROBIC AND ANAEROBIC 10CC  Final   Culture NO GROWTH 3  DAYS  Final   Report Status PENDING  Incomplete  MRSA PCR Screening     Status: None   Collection Time: 11/15/15  8:27 PM  Result Value Ref Range Status   MRSA by PCR NEGATIVE NEGATIVE Final    Comment:        The GeneXpert MRSA Assay (FDA approved for NASAL specimens only), is one component of a comprehensive MRSA colonization surveillance program. It is not intended to diagnose MRSA infection nor to guide or monitor treatment for MRSA infections.   Urine culture     Status: Abnormal   Collection Time: 11/15/15  9:55 PM  Result Value Ref Range Status   Specimen Description URINE, CLEAN CATCH  Final   Special Requests NONE  Final   Culture 3,000 COLONIES/mL INSIGNIFICANT GROWTH (A)  Final   Report Status 11/17/2015 FINAL  Final         Radiology Studies: No results found.      Scheduled Meds: . sodium chloride   Intravenous Once  . amLODipine  10 mg Oral QHS  . calcium acetate  1,334 mg Oral TID WC  . cloNIDine  0.2 mg Transdermal Weekly  . [START ON 11/20/2015] darbepoetin (ARANESP) injection - DIALYSIS  200 mcg Intravenous Q Mon-HD  . diatrizoate meglumine-sodium  30 mL Oral Once  . furosemide  40 mg Intravenous BID  . labetalol  150 mg Oral BID  . pantoprazole  40 mg Oral Daily  . piperacillin-tazobactam (ZOSYN)  IV  2.25 g Intravenous Q8H  . pravastatin  20 mg Oral q1800  . saccharomyces boulardii  250 mg Oral BID  . sodium chloride flush  3 mL Intravenous Q12H  . vancomycin  1,000 mg Intravenous Q M,W,F-HD   Continuous Infusions:    LOS: 3 days    Time spent: 40 minutes.    [facility], MD [name] Pager [phone]  If 7PM-7AM, please contact night-coverage [email or web address] Password [facility] 11/18/2015, 4:26 PM

## 2015-11-18 NOTE — Progress Notes (Signed)
  Subjective: No complaints. Hg stable  Objective: Vital signs in last 24 hours: Temp:  [98.2 F (36.8 C)-99.1 F (37.3 C)] 98.2 F (36.8 C) (05/20 0437) Pulse Rate:  [90-100] 91 (05/20 0437) Resp:  [16-20] 16 (05/20 0437) BP: (132-173)/(65-98) 151/65 mmHg (05/20 0437) SpO2:  [93 %-98 %] 95 % (05/20 0437) Weight:  [99 kg (218 lb 4.1 oz)-99.4 kg (219 lb 2.2 oz)] 99.4 kg (219 lb 2.2 oz) (05/19 2028) Last BM Date: 11/17/15  Intake/Output from previous day: 05/19 0701 - 05/20 0700 In: 710 [P.O.:360; I.V.:100; IV Piggyback:250] Out: 1501  Intake/Output this shift:    Resp: clear to auscultation bilaterally Cardio: regular rate and rhythm GI: soft, minimal tenderness.   Lab Results:   Recent Labs  11/17/15 0323 11/18/15 0540  WBC 17.3* 10.3  HGB 8.4* 7.9*  HCT 26.7* 25.3*  PLT 743* 733*   BMET  Recent Labs  11/17/15 0323 11/18/15 0540  NA 141 142  K 3.4* 3.8  CL 98* 98*  CO2 24 26  GLUCOSE 88 109*  BUN 75* 46*  CREATININE 11.12* 7.25*  CALCIUM 7.6* 8.0*   PT/INR No results for input(s): LABPROT, INR in the last 72 hours. ABG No results for input(s): PHART, HCO3 in the last 72 hours.  Invalid input(s): PCO2, PO2  Studies/Results: No results found.  Anti-infectives: Anti-infectives    Start     Dose/Rate Route Frequency Ordered Stop   11/17/15 1200  vancomycin (VANCOCIN) IVPB 1000 mg/200 mL premix     1,000 mg 200 mL/hr over 60 Minutes Intravenous Every [name]-F (Hemodialysis) 11/17/15 0945     11/16/15 0300  piperacillin-tazobactam (ZOSYN) IVPB 2.25 g     2.25 g 100 mL/hr over 30 Minutes Intravenous Every 8 hours 11/15/15 1943     11/15/15 1830  vancomycin (VANCOCIN) 2,000 mg in sodium chloride 0.9 % 500 mL IVPB     2,000 mg 250 mL/hr over 120 Minutes Intravenous  Once 11/15/15 1817 11/15/15 2120   11/15/15 1745  vancomycin (VANCOCIN) IVPB 1000 mg/200 mL premix  Status:  Discontinued     1,000 mg 200 mL/hr over 60 Minutes Intravenous  Once 11/15/15  1731 11/15/15 1817   11/15/15 1745  piperacillin-tazobactam (ZOSYN) IVPB 3.375 g     3.375 g 100 mL/hr over 30 Minutes Intravenous  Once 11/15/15 1731 11/15/15 1949      Assessment/Plan: s/p * No surgery found * Advance diet  Continue drain. Pt may go home with drain and follow up with Dr. Rosendo Gros next week Hg stable Ok for d/c from surgical standpoint  LOS: 3 days    TOTH III,PAUL S 11/18/2015

## 2015-11-19 LAB — TYPE AND SCREEN
ABO/RH(D): A NEG
ANTIBODY SCREEN: NEGATIVE
UNIT DIVISION: 0
UNIT DIVISION: 0
UNIT DIVISION: 0

## 2015-11-19 LAB — CBC
HEMATOCRIT: 25.3 % — AB (ref 39.0–52.0)
Hemoglobin: 7.8 g/dL — ABNORMAL LOW (ref 13.0–17.0)
MCH: 25.4 pg — AB (ref 26.0–34.0)
MCHC: 30.8 g/dL (ref 30.0–36.0)
MCV: 82.4 fL (ref 78.0–100.0)
PLATELETS: 815 10*3/uL — AB (ref 150–400)
RBC: 3.07 MIL/uL — ABNORMAL LOW (ref 4.22–5.81)
RDW: 17.3 % — AB (ref 11.5–15.5)
WBC: 11.2 10*3/uL — AB (ref 4.0–10.5)

## 2015-11-19 MED ORDER — ASPIRIN EC 81 MG PO TBEC
81.0000 mg | DELAYED_RELEASE_TABLET | Freq: Every day | ORAL | Status: DC
  Administered 2015-11-19 – 2015-11-20 (×2): 81 mg via ORAL
  Filled 2015-11-19 (×2): qty 1

## 2015-11-19 NOTE — Progress Notes (Addendum)
PROGRESS NOTE  Hayden [name]  [id number] DOB: 07-16-1970  DOA: 11/15/2015 PCP: Louis Meckel, MD   Brief Narrative:  45 year old male with a PMH of ESRD on HD, HTN, CVA, HLD, anemia, recent treatment for CAP hospitalized 11/04/15-11/09/15 for acute cholecystitis and underwent laparoscopic cholecystectomy by Dr. Rosendo Gros on 11/06/15, completed 5 days of inpatient IV Zosyn & vancomycin and was to finish 3 days of outpatient Augmentin, discharged with JP drain and outpatient surgical follow-up, now presented to the ED because of flank pain. CT abdomen showed pulse centimeter hematoma in the gallbladder fossa and right lower lobe pneumonia consistent with HCAP. Transfused 2 units PRBC for hemoglobin 6.9. General surgery and nephrology consulting. Improving.   Assessment & Plan:   Principal Problem:   Abdominal hematoma Active Problems:   Chronic systolic congestive heart failure/EF 40% PER echo 2010   End stage renal disease (HCC)   CVA (cerebral infarction)   GERD (gastroesophageal reflux disease)   Hypertension, well controlled   HTN (hypertension)   HCAP (healthcare-associated pneumonia)   Acute blood loss anemia   RUQ hematoma, S/P laparoscopic cholecystectomy 11/06/15. - hospitalized 11/04/15-11/09/15 for acute cholecystitis and underwent laparoscopic cholecystectomy by Dr. Rosendo Gros on 11/05/68, completed 5 days of inpatient IV Zosyn & vancomycin and was to finish 3 days of outpatient Augmentin, discharged with JP drain and outpatient surgical follow-up, now presented to the ED because of flank pain.  - CT abdomen showed pulse centimeter hematoma in the gallbladder fossa and right lower lobe pneumonia consistent with HCAP. - Afebrile. Leukocytosis has resolved. Blood cultures 2: Negative to date. Urine culture: Insignificant growth. - JP drain is not draining. - As per surgical follow-up: Continued pain, may go home with drain and follow-up with Dr. Rosendo Gros next week and stable for DC  from surgical standpoint. - Hemoglobin stable. - Discussed with [facility] MD on call 5/21: No indication for antibiotics. May resume aspirin 81 MG daily.  Healthcare associated pneumonia, right lower lobe - Patient has been on some form of antibiotics since 5/6 (IV Zosyn 5/6 > 5/10, azithromycin 1 on 5/7, vancomycin 1 on 5/9, PO Augmentin 3 days at discharge, IV Zosyn 5/17 >and IV vancomycin 5/17 >) - Blood cultures 2: Negative. MRSA PCR: Negative. Urine culture: Insignificant growth. - Discussed in detail with infectious M.D. on call on 5/21: Seems to have completed adequate course of antibiotics. Afebrile. Leukocytosis almost resolved and mostly asymptomatic. Recommended discontinuing all antibiotics. - Recommend repeating chest x-ray in 3-4 weeks to ensure resolution of pneumonia findings.  Acute blood loss anemia - Status post transfusion 2 units PRBC. - Hemoglobin relatively stable. Follow CBCs.  Thrombocytosis -Possibly reactive. Periodically follow CBCs as outpatient.   ESRD on home HD - Nephrology following and on MWF HD here. Management per nephrology.  Essential hypertension - Mildly uncontrolled. Continue labetalol, Catapres and amlodipine.  Status post CVA - Patient was on aspirin PTA-was initially held. Discussed with surgical team and resumed on [id number]  Chronic systolic CHF - LVEF [id number] per echo 2010. Volume management across dialysis. Compensated. Patient on Lasix  GERD - PPI.   DVT prophylaxis: SCDs Code Status: Full Family Communication: Discussed with patient's spouse at bedside 5/21 Disposition Plan: DC home possibly 5/22 post dialysis.   Consultants:   General surgery  Nephrology  Procedures:   RUQ JP drain-present prior to admission  HD  Antimicrobials:   IV vancomycin-discontinued 5/21  IV Zosyn -discontinued 5/21   Subjective: Feels "sick"-achy all over doesn't feel as good as yesterday. No  fever, nausea, vomiting, cough, dyspnea, diarrhea.  Mild RUQ discomfort at site of drain.  Objective:  Filed Vitals:   11/18/15 1708 11/18/15 2005 11/19/15 0423 11/19/15 1003  BP: 158/75 171/75 152/73 159/72  Pulse: 95 83 85 84  Temp: 98 F (36.7 C) 98 F (36.7 C) 97.7 F (36.5 C) 97.9 F (36.6 C)  TempSrc: Oral   Oral  Resp: 18 20 22 20   Height:      Weight:  101.1 kg (222 lb 14.2 oz)    SpO2: 94% 95% 94% 95%    Intake/Output Summary (Last 24 hours) at 11/19/15 1445 Last data filed at 11/19/15 0900  Gross per 24 hour  Intake    560 ml  Output      0 ml  Net    560 ml   Filed Weights   11/17/15 1022 11/17/15 2028 11/18/15 2005  Weight: 99 kg (218 lb 4.1 oz) 99.4 kg (219 lb 2.2 oz) 101.1 kg (222 lb 14.2 oz)    Examination:  General exam: Pleasant young male lying comfortably propped up in bed.Does not look septic or toxic. Respiratory system: Clear to auscultation. Respiratory effort normal. Cardiovascular system: S1 & S2 heard, RRR. No JVD, murmurs, rubs, gallops or clicks. No pedal edema. Gastrointestinal system: Abdomen is nondistended, soft and nontender. No organomegaly or masses felt. Normal bowel sounds heard. JP drain + (no drainage) Central nervous system: Alert and oriented. No focal neurological deficits. Extremities: Symmetric 5 x 5 power. Skin: No rashes, lesions or ulcers Psychiatry: Judgement and insight appear normal. Mood & affect appropriate.     Data Reviewed: I have personally reviewed following labs and imaging studies  CBC:  Recent Labs Lab 11/15/15 1202 11/16/15 0239 11/16/15 0801 11/17/15 0323 11/18/15 0540 11/19/15 0540  WBC 27.2* 17.5* 16.4* 17.3* 10.3 11.2*  NEUTROABS 24.3*  --   --   --   --   --   HGB 6.9* 8.3* 8.2* 8.4* 7.9* 7.8*  HCT 20.6* 25.6* 25.1* 26.7* 25.3* 25.3*  MCV 80.5 80.5 81.8 79.7 81.9 82.4  PLT 575* 637* 603* 743* 733* [id number]   Basic Metabolic Panel:  Recent Labs Lab 11/15/15 1202 11/15/15 1820 11/16/15 0239 11/16/15 0800 11/17/15 0323 11/18/15 0540  NA  138  --  139 140 141 142  K 3.2*  --  3.3* 3.6 3.4* 3.8  CL 96*  --  98* 98* 98* 98*  CO2 24  --  23 23 24 26   GLUCOSE 96  --  79 90 88 109*  BUN 55*  --  61* 65* 75* 46*  CREATININE 8.79*  --  9.47* 9.65* 11.12* 7.25*  CALCIUM 7.8*  --  7.3* 7.4* 7.6* 8.0*  MG  --  2.2  --   --   --   --   PHOS  --   --   --  10.0*  --   --    GFR: Estimated Creatinine Clearance: 15.6 mL/min (by C-G formula based on Cr of 7.25). Liver Function Tests:  Recent Labs Lab 11/15/15 1202 11/16/15 0239 11/16/15 0800 11/17/15 0323 11/18/15 0540  AST 34 36  --  34 37  ALT 38 43  --  46 48  ALKPHOS 220* 236*  --  291* 252*  BILITOT 0.8 0.9  --  1.2 0.6  PROT 5.3* 5.6*  --  6.2* 5.8*  ALBUMIN 2.1* 2.0* 2.1* 2.1* 1.9*    Recent Labs Lab 11/15/15 1202  LIPASE 85*   No results  for input(s): AMMONIA in the last 168 hours. Coagulation Profile: No results for input(s): INR, PROTIME in the last 168 hours. Cardiac Enzymes:  Recent Labs Lab 11/15/15 1202 11/15/15 1820 11/16/15 0239  TROPONINI 0.05* 0.06* 0.10*   BNP (last 3 results) No results for input(s): PROBNP in the last 8760 hours. HbA1C: No results for input(s): HGBA1C in the last 72 hours. CBG: No results for input(s): GLUCAP in the last 168 hours. Lipid Profile: No results for input(s): CHOL, HDL, LDLCALC, TRIG, CHOLHDL, LDLDIRECT in the last 72 hours. Thyroid Function Tests: No results for input(s): TSH, T4TOTAL, FREET4, T3FREE, THYROIDAB in the last 72 hours. Anemia Panel: No results for input(s): VITAMINB12, FOLATE, FERRITIN, TIBC, IRON, RETICCTPCT in the last 72 hours.  Sepsis Labs: No results for input(s): PROCALCITON, LATICACIDVEN in the last 168 hours.  Recent Results (from the past 240 hour(s))  Culture, blood (Routine X 2) w Reflex to ID Panel     Status: None (Preliminary result)   Collection Time: 11/15/15  6:10 PM  Result Value Ref Range Status   Specimen Description BLOOD RIGHT ANTECUBITAL  Final   Special  Requests BOTTLES DRAWN AEROBIC AND ANAEROBIC 10CC  Final   Culture NO GROWTH 4 DAYS  Final   Report Status PENDING  Incomplete  Culture, blood (Routine X 2) w Reflex to ID Panel     Status: None (Preliminary result)   Collection Time: 11/15/15  6:20 PM  Result Value Ref Range Status   Specimen Description BLOOD RIGHT ARM  Final   Special Requests BOTTLES DRAWN AEROBIC AND ANAEROBIC 10CC  Final   Culture NO GROWTH 4 DAYS  Final   Report Status PENDING  Incomplete  MRSA PCR Screening     Status: None   Collection Time: 11/15/15  8:27 PM  Result Value Ref Range Status   MRSA by PCR NEGATIVE NEGATIVE Final    Comment:        The GeneXpert MRSA Assay (FDA approved for NASAL specimens only), is one component of a comprehensive MRSA colonization surveillance program. It is not intended to diagnose MRSA infection nor to guide or monitor treatment for MRSA infections.   Urine culture     Status: Abnormal   Collection Time: 11/15/15  9:55 PM  Result Value Ref Range Status   Specimen Description URINE, CLEAN CATCH  Final   Special Requests NONE  Final   Culture 3,000 COLONIES/mL INSIGNIFICANT GROWTH (A)  Final   Report Status 11/17/2015 FINAL  Final         Radiology Studies: No results found.      Scheduled Meds: . amLODipine  10 mg Oral QHS  . aspirin EC  81 mg Oral Daily  . buPROPion  150 mg Oral Daily  . buPROPion  300 mg Oral QHS  . calcitRIOL  0.5 mcg Oral Daily  . calcium acetate  1,334 mg Oral TID WC  . cinacalcet  60 mg Oral Q breakfast  . cloNIDine  0.2 mg Transdermal Weekly  . [START ON 11/20/2015] darbepoetin (ARANESP) injection - DIALYSIS  200 mcg Intravenous Q Mon-HD  . diatrizoate meglumine-sodium  30 mL Oral Once  . escitalopram  20 mg Oral Daily  . furosemide  80 mg Oral BID  . labetalol  150 mg Oral BID  . pantoprazole  40 mg Oral Daily  . pravastatin  20 mg Oral q1800  . saccharomyces boulardii  250 mg Oral BID  . sodium chloride flush  3 mL  Intravenous Q12H  Continuous Infusions:    LOS: 4 days    Time spent: 20 minutes.    [facility], MD [name] Pager [phone]  If 7PM-7AM, please contact night-coverage [email or web address] Password [email or web address] 11/19/2015, 2:45 PM

## 2015-11-19 NOTE — Progress Notes (Signed)
[facility] Progress Note   Subjective: no c/o's  Objective Filed Vitals:   11/18/15 1708 11/18/15 2005 11/19/15 0423 11/19/15 1003  BP: 158/75 171/75 152/73 159/72  Pulse: 95 83 85 84  Temp: 98 F (36.7 C) 98 F (36.7 C) 97.7 F (36.5 C) 97.9 F (36.6 C)  TempSrc: Oral   Oral  Resp: 18 20 22 20   Height:      Weight:  101.1 kg (222 lb 14.2 oz)    SpO2: 94% 95% 94% 95%   Physical Exam General: Well nourished, cooperative, obese Heart: S1,S2, RRR No M/R/G LUngs: dec'd BS R base, o/w clear Abdomen: ntnd, +BS, drain RUQ minimal output Extremities: No LE edema Dialysis Access: LFA AVF +bruit  Home HD: NxStage EDW 108 , dialyzes 5 x/ week, 2.5-3 hr each, heparin 2000    Assessment: 1. Anemia: sp prbc's, esa 2. RLL HCAP - IV abx, improving, wbc down 3. ESRD - home HD.  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 HTN - resume labet/ cat patch/ norvasc. BP's up 6. Nutrition - Albumin 2.1 Renal diet. Add prostat/renal vits 7. S/P CVA: per primary-cont ASA/Plavix. 8. Vol under dry wt , no vol excess - keep even w HD 9. RUQ hematoma - by CT, postop after lap chole 5/8, drain in place 10. Dispo - prob dc Monday after HD  P - HD Monday first shift  [place] pager [phone]    cell [phone] 11/18/2015, 11:25 AM   Additional Objective Labs: Basic Metabolic Panel:  Recent Labs Lab 11/16/15 0800 11/17/15 0323 11/18/15 0540  NA 140 141 142  K 3.6 3.4* 3.8  CL 98* 98* 98*  CO2 23 24 26   GLUCOSE 90 88 109*  BUN 65* 75* 46*  CREATININE 9.65* 11.12* 7.25*  CALCIUM 7.4* 7.6* 8.0*  PHOS 10.0*  --   --    Liver Function Tests:  Recent Labs Lab 11/16/15 0239 11/16/15 0800 11/17/15 0323 11/18/15 0540  AST 36  --  34 37  ALT 43  --  46 48  ALKPHOS 236*  --  291* 252*  BILITOT 0.9  --  1.2 0.6  PROT 5.6*  --  6.2* 5.8*  ALBUMIN 2.0* 2.1* 2.1* 1.9*    Recent Labs Lab 11/15/15 1202  LIPASE 85*   CBC:  Recent  Labs Lab 11/15/15 1202 11/16/15 0239 11/16/15 0801 11/17/15 0323 11/18/15 0540 11/19/15 0540  WBC 27.2* 17.5* 16.4* 17.3* 10.3 11.2*  NEUTROABS 24.3*  --   --   --   --   --   HGB 6.9* 8.3* 8.2* 8.4* 7.9* 7.8*  HCT 20.6* 25.6* 25.1* 26.7* 25.3* 25.3*  MCV 80.5 80.5 81.8 79.7 81.9 82.4  PLT 575* 637* 603* 743* 733* 815*   Blood Culture    Component Value Date/Time   SDES URINE, CLEAN CATCH 11/15/2015 2155   SPECREQUEST NONE 11/15/2015 2155   CULT 3,000 COLONIES/mL INSIGNIFICANT GROWTH* 11/15/2015 2155   REPTSTATUS 11/17/2015 FINAL 11/15/2015 2155    Cardiac Enzymes:  Recent Labs Lab 11/15/15 1202 11/15/15 1820 11/16/15 0239  TROPONINI 0.05* 0.06* 0.10*   CBG: No results for input(s): GLUCAP in the last 168 hours. Iron Studies:  No results for input(s): IRON, TIBC, TRANSFERRIN, FERRITIN in the last 72 hours. @lablastinr3 @ Studies/Results: No results found. Medications:   . amLODipine  10 mg Oral QHS  . buPROPion  150 mg Oral Daily  . buPROPion  300 mg Oral QHS  .  calcitRIOL  0.5 mcg Oral Daily  . calcium acetate  1,334 mg Oral TID WC  . cinacalcet  60 mg Oral Q breakfast  . cloNIDine  0.2 mg Transdermal Weekly  . [START ON 11/20/2015] darbepoetin (ARANESP) injection - DIALYSIS  200 mcg Intravenous Q Mon-HD  . diatrizoate meglumine-sodium  30 mL Oral Once  . escitalopram  20 mg Oral Daily  . furosemide  80 mg Oral BID  . labetalol  150 mg Oral BID  . pantoprazole  40 mg Oral Daily  . piperacillin-tazobactam (ZOSYN)  IV  2.25 g Intravenous Q8H  . pravastatin  20 mg Oral q1800  . saccharomyces boulardii  250 mg Oral BID  . sodium chloride flush  3 mL Intravenous Q12H  . vancomycin  1,000 mg Intravenous Q M,W,F-HD

## 2015-11-20 DIAGNOSIS — N186 End stage renal disease: Secondary | ICD-10-CM

## 2015-11-20 LAB — CBC
HEMATOCRIT: 25.3 % — AB (ref 39.0–52.0)
Hemoglobin: 7.9 g/dL — ABNORMAL LOW (ref 13.0–17.0)
MCH: 26.2 pg (ref 26.0–34.0)
MCHC: 31.2 g/dL (ref 30.0–36.0)
MCV: 84.1 fL (ref 78.0–100.0)
PLATELETS: 754 10*3/uL — AB (ref 150–400)
RBC: 3.01 MIL/uL — AB (ref 4.22–5.81)
RDW: 17 % — AB (ref 11.5–15.5)
WBC: 11.5 10*3/uL — AB (ref 4.0–10.5)

## 2015-11-20 LAB — CULTURE, BLOOD (ROUTINE X 2)
CULTURE: NO GROWTH
Culture: NO GROWTH

## 2015-11-20 MED ORDER — CALCIUM ACETATE (PHOS BINDER) 667 MG PO CAPS: 1334.0000 mg | ORAL_CAPSULE | Freq: Three times a day (TID) | ORAL | Status: DC

## 2015-11-20 NOTE — Progress Notes (Signed)
  Subjective: Feels much better No abdominal pain other than discomfort at drain site  Objective: Vital signs in last 24 hours: Temp:  [97.9 F (36.6 C)-98.2 F (36.8 C)] 98.2 F (36.8 C) (05/22 0504) Pulse Rate:  [84-90] 90 (05/22 0504) Resp:  [20-23] 23 (05/22 0504) BP: (159-172)/(72-78) 172/78 mmHg (05/22 0504) SpO2:  [93 %-95 %] 93 % (05/22 0504) Last BM Date: 11/18/15  Intake/Output from previous day: 05/21 0701 - 05/22 0700 In: 243 [P.O.:240; I.V.:3] Out: 500 [Urine:500] Intake/Output this shift:    Abdomen soft, clot at drain exit site  Lab Results:   Recent Labs  11/19/15 0540 11/20/15 0357  WBC 11.2* 11.5*  HGB 7.8* 7.9*  HCT 25.3* 25.3*  PLT 815* 754*   BMET  Recent Labs  11/18/15 0540  NA 142  K 3.8  CL 98*  CO2 26  GLUCOSE 109*  BUN 46*  CREATININE 7.25*  CALCIUM 8.0*   PT/INR No results for input(s): LABPROT, INR in the last 72 hours. ABG No results for input(s): PHART, HCO3 in the last 72 hours.  Invalid input(s): PCO2, PO2  Studies/Results: No results found.  Anti-infectives: Anti-infectives    Start     Dose/Rate Route Frequency Ordered Stop   11/17/15 1200  vancomycin (VANCOCIN) IVPB 1000 mg/200 mL premix  Status:  Discontinued     1,000 mg 200 mL/hr over 60 Minutes Intravenous Every [name]-F (Hemodialysis) 11/17/15 0945 11/19/15 1445   11/16/15 0300  piperacillin-tazobactam (ZOSYN) IVPB 2.25 g  Status:  Discontinued     2.25 g 100 mL/hr over 30 Minutes Intravenous Every 8 hours 11/15/15 1943 11/19/15 1445   11/15/15 1830  vancomycin (VANCOCIN) 2,000 mg in sodium chloride 0.9 % 500 mL IVPB     2,000 mg 250 mL/hr over 120 Minutes Intravenous  Once 11/15/15 1817 11/15/15 2120   11/15/15 1745  vancomycin (VANCOCIN) IVPB 1000 mg/200 mL premix  Status:  Discontinued     1,000 mg 200 mL/hr over 60 Minutes Intravenous  Once 11/15/15 1731 11/15/15 1817   11/15/15 1745  piperacillin-tazobactam (ZOSYN) IVPB 3.375 g     3.375 g 100  mL/hr over 30 Minutes Intravenous  Once 11/15/15 1731 11/15/15 1949      Assessment/Plan:  RUQ hematoma s/p lap chole  From a surgical standpoint, he can go home with the drain.  Will follow up with Dr. Rosendo Gros as outpt to remove the drain  LOS: 5 days    Hayden [name] A 11/20/2015

## 2015-11-20 NOTE — Progress Notes (Signed)
[facility] Progress Note  Assessment/Plan: 1. Anemia:  hgb 7.9 stable s/p 1 unit PRBC- will have outpt HD unit monitor closely after d/c 2. RLL HCAP - off abx, improving, wbc down 3. 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 HTN - resume labet/ cat patch/ norvasc. BP's up; UF 1.5 Friday- below prior EDW continue to challenge at home- yes agree 6. Nutrition - Albumin 2.1 Renal diet. Add prostat/renal vits 7. S/P CVA: per primary-cont ASA/Plavix. 8. Vol under dry wt ,- likely has lost weight with recent events and edw - we discussed - need to challenge volume a little due to ^ BP 9. RUQ hematoma - by CT, postop after lap chole 5/8, drain in place- ok to go home with drain per surgery 10. Dispo - plan dc today  Myriam Jacobson, PA-C [place] 11/20/2015,9:49 AM  LOS: 5 days    Patient seen and examined, agree with above note with above modifications. Pt seen before discharge- feels much better surgical wise- is going to do his treatment at home tonight- I am going to see him on June 5th as OP Corliss Parish, MD 11/20/2015     Subjective:   Ready to go home  Objective Filed Vitals:   11/19/15 1003 11/19/15 2003 11/20/15 0504 11/20/15 0900  BP: 159/72 172/77 172/78 148/68  Pulse: 84 87 90 91  Temp: 97.9 F (36.6 C) 98.2 F (36.8 C) 98.2 F (36.8 C) 97.9 F (36.6 C)  TempSrc: Oral   Oral  Resp: 20 23 23 20   Height:      Weight:      SpO2: 95% 94% 93% 96%   Physical Exam General:NAD Heart: RRR Lungs: crackles right base Abdomen: soft - dark blood in drain Extremities: no LE edema Dialysis Access: left lower AVF + bruit  Dialysis Orders: NxStage EDW 108 , dialyzes 5 x/ week, 2.5-3 hr each, heparin 2000  Additional Objective Labs: Basic Metabolic Panel:  Recent Labs Lab 11/16/15 0800 11/17/15 0323 11/18/15 0540  NA 140  141 142  K 3.6 3.4* 3.8  CL 98* 98* 98*  CO2 23 24 26   GLUCOSE 90 88 109*  BUN 65* 75* 46*  CREATININE 9.65* 11.12* 7.25*  CALCIUM 7.4* 7.6* 8.0*  PHOS 10.0*  --   --    Liver Function Tests:  Recent Labs Lab 11/16/15 0239 11/16/15 0800 11/17/15 0323 11/18/15 0540  AST 36  --  34 37  ALT 43  --  46 48  ALKPHOS 236*  --  291* 252*  BILITOT 0.9  --  1.2 0.6  PROT 5.6*  --  6.2* 5.8*  ALBUMIN 2.0* 2.1* 2.1* 1.9*    Recent Labs Lab 11/15/15 1202  LIPASE 85*   CBC:  Recent Labs Lab 11/15/15 1202  11/16/15 0801 11/17/15 0323 11/18/15 0540 11/19/15 0540 11/20/15 0357  WBC 27.2*  < > 16.4* 17.3* 10.3 11.2* 11.5*  NEUTROABS 24.3*  --   --   --   --   --   --   HGB 6.9*  < > 8.2* 8.4* 7.9* 7.8* 7.9*  HCT 20.6*  < > 25.1* 26.7* 25.3* 25.3* 25.3*  MCV 80.5  < > 81.8 79.7 81.9 82.4 84.1  PLT 575*  < > 603* 743* 733* 815* 754*  < > = values in this interval not displayed. Blood Culture  Component Value Date/Time   SDES URINE, CLEAN CATCH 11/15/2015 2155   SPECREQUEST NONE 11/15/2015 2155   CULT 3,000 COLONIES/mL INSIGNIFICANT GROWTH* 11/15/2015 2155   REPTSTATUS 11/17/2015 FINAL 11/15/2015 2155    Cardiac Enzymes:  Recent Labs Lab 11/15/15 1202 11/15/15 1820 11/16/15 0239  TROPONINI 0.05* 0.06* 0.10*  Medications:   . amLODipine  10 mg Oral QHS  . aspirin EC  81 mg Oral Daily  . buPROPion  150 mg Oral Daily  . buPROPion  300 mg Oral QHS  . calcitRIOL  0.5 mcg Oral Daily  . calcium acetate  1,334 mg Oral TID WC  . cinacalcet  60 mg Oral Q breakfast  . cloNIDine  0.2 mg Transdermal Weekly  . darbepoetin (ARANESP) injection - DIALYSIS  200 mcg Intravenous Q Mon-HD  . diatrizoate meglumine-sodium  30 mL Oral Once  . escitalopram  20 mg Oral Daily  . furosemide  80 mg Oral BID  . labetalol  150 mg Oral BID  . pantoprazole  40 mg Oral Daily  . pravastatin  20 mg Oral q1800  . saccharomyces boulardii  250 mg Oral BID  . sodium chloride flush  3 mL  Intravenous Q12H

## 2015-11-20 NOTE — Discharge Summary (Signed)
Physician Discharge Summary  Hayden [name]  [id number]  DOB: 1971/02/15  DOA: 11/15/2015  PCP: Louis Meckel, MD  Admit date: 11/15/2015 Discharge date: 11/20/2015  Time spent: Greater than 30 minutes  Recommendations for Outpatient Follow-up:  1. Dr. Ralene Ok, General Surgery in 5 days (this week for possible drain removal). 2. Dr. Corliss Parish, PCP/Nephrology in week. Nephrology service will arrange follow up with Labs. Please follow final blood culture results that were sent from the hospital. 3. Recommend follow-up chest x-ray in 3-4 weeks to ensure resolution of pneumonia findings.  Discharge Diagnoses:  Principal Problem:   Abdominal hematoma Active Problems:   Chronic systolic congestive heart failure/EF 40% PER echo 2010   End stage renal disease (HCC)   CVA (cerebral infarction)   GERD (gastroesophageal reflux disease)   Hypertension, well controlled   HTN (hypertension)   HCAP (healthcare-associated pneumonia)   Acute blood loss anemia   Discharge Condition: Improved & Stable  Diet recommendation: Heart Healthy diet.  Filed Weights   11/17/15 1022 11/17/15 2028 11/18/15 2005  Weight: 99 kg (218 lb 4.1 oz) 99.4 kg (219 lb 2.2 oz) 101.1 kg (222 lb 14.2 oz)    History of present illness:  45 year old male with a PMH of ESRD on HD, HTN, CVA, HLD, anemia, recent treatment for CAP hospitalized 11/04/15-11/09/15 for acute cholecystitis and underwent laparoscopic cholecystectomy by Dr. Rosendo Gros on 11/06/15, completed 5 days of inpatient IV Zosyn & vancomycin and was to finish 3 days of outpatient Augmentin, discharged with JP drain and outpatient surgical follow-up, now presented to the ED because of flank pain. CT abdomen showed pulse centimeter hematoma in the gallbladder fossa and right lower lobe pneumonia consistent with HCAP. Transfused 2 units PRBC for hemoglobin 6.9. General surgery and nephrology Consulted. Improved.  Hospital Course:   RUQ  hematoma, S/P laparoscopic cholecystectomy 11/06/15. - hospitalized 11/04/15-11/09/15 for acute cholecystitis and underwent laparoscopic cholecystectomy by Dr. Rosendo Gros on 11/05/68, completed 5 days of inpatient IV Zosyn & vancomycin and completed outpatient 3 days of outpatient Augmentin, discharged with JP drain and outpatient surgical follow-up, now presented to the ED because of flank pain.  - CT abdomen showed 12 centimeter hematoma in the gallbladder fossa and right lower lobe pneumonia consistent with HCAP. - Afebrile. Leukocytosis has almost resolved. Blood cultures 2: Negative to date. Urine culture: Insignificant growth. - JP drain is not draining. - Hemoglobin stable. - Discussed with [facility] MD on call 5/21: No indication for antibiotics. May resume aspirin 81 MG daily. - Discussed with Dr. Ninfa Linden, general surgery-cleared for discharge home and may follow-up with Dr. Rosendo Gros later this week for JP drain removal.  Healthcare associated pneumonia, right lower lobe - Patient has been on some form of antibiotics since 5/6 (IV Zosyn 5/6 > 5/10, azithromycin 1 on 5/7, vancomycin 1 on 5/9, PO Augmentin 3 days at discharge, IV Zosyn 5/17 >and IV vancomycin 5/17 >) - Blood cultures 2: Negative. MRSA PCR: Negative. Urine culture: Insignificant growth. - Discussed in detail with infectious M.D. on call on 5/21: Seems to have completed adequate course of antibiotics. Afebrile. Leukocytosis almost resolved and asymptomatic. Recommended discontinuing all antibiotics. - Recommend repeating chest x-ray in 3-4 weeks to ensure resolution of pneumonia findings.  Acute blood loss anemia - Status post transfusion 2 units PRBC. - Hemoglobin stable in the 7.8-7.9 range for the last 3 days. Nephrology will arrange outpatient follow-up with repeat labs.  Thrombocytosis -Possibly reactive. Periodically follow CBCs as outpatient.   ESRD on home HD -  Nephrology following and on MWF HD here. Nephrology follow-up  appreciated. Discussed with them and they have cleared him for discharge home and recommend continuing PhosLo at discharge. Patient will resume his HD today. - Mildly elevated troponin. No reported chest pain or acute EKG changes. Probably from ESRD and demand ischemia related to acute illness.  Essential hypertension - Mildly uncontrolled. Continue labetalol, Catapres and amlodipine. As discussed with nephrology, resume oral hydralazine that had been on hold in the hospital.  Status post CVA - Patient was on aspirin PTA-was initially held. Discussed with surgical team and resumed on 5/21. Patient confirms that he was not on Plavix prior to admission.  Chronic systolic CHF - LVEF [id number] per echo 2010. Volume management across dialysis. Compensated. Continue Lasix. As per nephrology, still makes urine.  GERD - PPI.   Consultants:   General surgery  Nephrology  Procedures:   RUQ JP drain-present prior to admission  HD   Discharge Exam:  Complaints: Feels much better today. Mild discomfort at JP drain site but otherwise denies complaints. No cough, chest pain, dyspnea, fever or chills, nausea, vomiting or diarrhea. Tolerating diet. Ambulating comfortably in the halls. Anxious to go home. Patient was inadvertently not put on schedule for HD today-he and spouse wish to go home and resume home HD.  Filed Vitals:   11/19/15 1003 11/19/15 2003 11/20/15 0504 11/20/15 0900  BP: 159/72 172/77 172/78 148/68  Pulse: 84 87 90 91  Temp: 97.9 F (36.6 C) 98.2 F (36.8 C) 98.2 F (36.8 C) 97.9 F (36.6 C)  TempSrc: Oral   Oral  Resp: 20 23 23 20   Height:      Weight:      SpO2: 95% 94% 93% 96%    General exam: Pleasant young male sitting up comfortably in chair this morning. Respiratory system: Clear to auscultation. Respiratory effort normal. Cardiovascular system: S1 & S2 heard, RRR. No JVD, murmurs, rubs, gallops or clicks. No pedal edema. Gastrointestinal system: Abdomen is  nondistended, soft and nontender. No organomegaly or masses felt. Normal bowel sounds heard. JP drain + (no drainage) Central nervous system: Alert and oriented. No focal neurological deficits. Extremities: Symmetric 5 x 5 power. Skin: No rashes, lesions or ulcers Psychiatry: Judgement and insight appear normal. Mood & affect appropriate.   Discharge Instructions      Discharge Instructions    Call MD for:  difficulty breathing, headache or visual disturbances    Complete by:  As directed      Call MD for:  extreme fatigue    Complete by:  As directed      Call MD for:  persistant dizziness or light-headedness    Complete by:  As directed      Call MD for:  persistant nausea and vomiting    Complete by:  As directed      Call MD for:  redness, tenderness, or signs of infection (pain, swelling, redness, odor or green/yellow discharge around incision site)    Complete by:  As directed      Call MD for:  severe uncontrolled pain    Complete by:  As directed      Call MD for:  temperature >100.4    Complete by:  As directed      Diet - low sodium heart healthy    Complete by:  As directed      Increase activity slowly    Complete by:  As directed  Medication List    TAKE these medications        ALPRAZolam 1 MG tablet  Commonly known as:  XANAX  Take 1 mg by mouth at bedtime as needed for anxiety.     amLODipine 10 MG tablet  Commonly known as:  NORVASC  Take 10 mg by mouth daily.     aspirin EC 81 MG tablet  Take 81 mg by mouth daily.     buPROPion 150 MG 12 hr tablet  Commonly known as:  WELLBUTRIN SR  Take one tab by mouth every morning & two tabs every evening     calcitRIOL 0.5 MCG capsule  Commonly known as:  ROCALTROL  Take 0.5 mcg by mouth daily.     calcium acetate 667 MG capsule  Commonly known as:  PHOSLO  Take 2 capsules (1,334 mg total) by mouth 3 (three) times daily with meals.     cinacalcet 30 MG tablet  Commonly known as:  SENSIPAR   Take 60 mg by mouth daily.     cloNIDine 0.2 mg/24hr patch  Commonly known as:  CATAPRES - Dosed in mg/24 hr  Place 1 patch onto the skin once a week.     escitalopram 20 MG tablet  Commonly known as:  LEXAPRO  Take 20 mg by mouth daily.     furosemide 80 MG tablet  Commonly known as:  LASIX  Take 80 mg by mouth 2 (two) times daily.     hydrALAZINE 100 MG tablet  Commonly known as:  APRESOLINE  Take 100 mg by mouth 2 (two) times daily.     labetalol 100 MG tablet  Commonly known as:  NORMODYNE  Take 150 mg by mouth 2 (two) times daily.     omeprazole 40 MG capsule  Commonly known as:  PRILOSEC  Take 40 mg by mouth daily.     oxyCODONE 5 MG immediate release tablet  Commonly known as:  Oxy IR/ROXICODONE  Take 1 tablet (5 mg total) by mouth every 6 (six) hours as needed for severe pain.     pravastatin 20 MG tablet  Commonly known as:  PRAVACHOL  Take 20 mg by mouth daily.     saccharomyces boulardii 250 MG capsule  Commonly known as:  FLORASTOR  Take 1 capsule (250 mg total) by mouth 2 (two) times daily.     VITAMIN D PO  Take 1 capsule by mouth daily.       Follow-up Information    Follow up with Reyes Ivan, MD. Schedule an appointment as soon as possible for a visit in 5 days.   Specialty:  General Surgery   Why:  this week for possible drain removal.   Contact information:   [place] [address] [phone]       Follow up with GOLDSBOROUGH,KELLIE A, MD. Schedule an appointment as soon as possible for a visit in 1 week.   Specialty:  Nephrology   Why:  Continue prior home hemodialysis beginning today.   Contact information:   [place] [address] [phone]       Get Medicines reviewed and adjusted: Please take all your medications with you for your next visit with your Primary MD  Please request your Primary MD to go over all hospital tests and procedure/radiological results at the follow up.  Please ask your Primary MD to get all Hospital records sent to his/her office.  If you experience worsening of  your admission symptoms, develop shortness of breath, life threatening emergency, suicidal or homicidal thoughts you must seek medical attention immediately by calling 911 or calling your MD immediately if symptoms less severe.  You must read complete instructions/literature along with all the possible adverse reactions/side effects for all the Medicines you take and that have been prescribed to you. Take any new Medicines after you have completely understood and accept all the possible adverse reactions/side effects.   Do not drive when taking pain medications.   Do not take more than prescribed Pain, Sleep and Anxiety Medications  Special Instructions: If you have smoked or chewed Tobacco in the last 2 yrs please stop smoking, stop any regular Alcohol and or any Recreational drug use.  Wear Seat belts while driving.  Please note  You were cared for by a hospitalist during your hospital stay. Once you are discharged, your primary care physician will handle any further medical issues. Please note that NO REFILLS for any discharge medications will be authorized once you are discharged, as it is imperative that you return to your primary care physician (or establish a relationship with a primary care physician if you do not have one) for your aftercare needs so that they can reassess your need for medications and monitor your lab values.    The results of significant diagnostics from this hospitalization (including imaging, microbiology, ancillary and laboratory) are listed below for reference.    Significant Diagnostic Studies: Ct Abdomen Pelvis Wo Contrast  11/15/2015  CLINICAL DATA:  Anemia and elevated white blood cell count following cholecystectomy on 11/06/2015. Generalized weakness. History of end-stage renal disease and right nephrectomy for cancer. EXAM: CT CHEST, ABDOMEN  AND PELVIS WITHOUT CONTRAST TECHNIQUE: Multidetector CT imaging of the chest, abdomen and pelvis was performed following the standard protocol without IV contrast. COMPARISON:  CT abdomen and pelvis 11/05/2015. Chest radiographs 11/15/2015 and 11/04/2015. FINDINGS: CT CHEST No enlarged axillary, mediastinal, or hilar lymph nodes are identified. There is advanced aortic and coronary artery atherosclerosis. The heart is enlarged. There may be a trace right pleural effusion. Moderate volume right lower lobe consolidation is present with associated air bronchograms. Minimal dependent atelectasis is noted in the left lower lobe. Major airways are patent. No suspicious lytic or blastic osseous lesion is identified. Thoracic spondylosis is noted. CT ABDOMEN AND PELVIS Sequelae of interval cholecystectomy are identified. There is a hematoma extending from the region of the gallbladder fossa inferiorly along the right hepatic lobe and into the pericolic gutter. This hematoma measures approximately 12 x 6 x 8 cm. A surgical drain courses through the hematoma. There is mild mass effect on the inferior right hepatic lobe. Small volume blood products are also noted more anteriorly and laterally in the right lower quadrant as well as in the left upper quadrant. The spleen, right adrenal gland, and pancreas are unremarkable. 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 There is a nonobstructing calculus in the interpolar right kidney. No left hydronephrosis. Wall thickening involving the duodenum appears improved from the prior CT. Oral contrast is present in multiple loops of proximal small bowel, and there is a similar appearance of mild proximal small bowel dilatation up to 3.5 cm in diameter. Gas is present in more distal small bowel and colon. No evidence of appendicitis. Bladder and prostate are unremarkable. Diffuse atherosclerotic calcification is noted involving the abdominal aorta and its major branch vessels. No  enlarged lymph nodes are identified. Disc bulging with annular calcification at L4-5 and L5-S1.  IMPRESSION: 1. Interval cholecystectomy with 12 cm hematoma in the gallbladder fossa and about the inferior right hepatic lobe. The surgical drain courses through this hematoma. 2. Persistent mild small bowel dilatation, query ileus. 3. Right lower lobe consolidation consistent with pneumonia. These results will be called to the ordering clinician or representative by the Radiologist Assistant, and communication documented in the PACS or zVision Dashboard. Electronically Signed   By: Logan Bores M.D.   On: 11/15/2015 17:22    Dg Chest 2 View  11/15/2015  CLINICAL DATA:  Followup right lower lobe pneumonia EXAM: CHEST  2 VIEW COMPARISON:  11/04/2015 FINDINGS: Cardiac shadow is stable. The lungs are well aerated bilaterally. The previously seen right lower lobe pneumonia has nearly completely resolved. There is however a an underlying nodular appearing density which measures approximately 2.5 cm in greatest dimension. Some deformity of the minor fissure is noted in this is suspicious for an underlying mass lesion. CT of the chest is recommended for further evaluation. No acute bony abnormality is noted. No other focal abnormality is seen. IMPRESSION: Air complete resolution of previously seen right lower lobe infiltrate. There is a a residual somewhat nodular appearing density identified. This may simply represent residual infiltrate although the possibility of an underlying lesion could not be totally excluded. CT of the chest with contrast is recommended for further evaluation. Electronically Signed   By: Inez Catalina M.D.   On: 11/15/2015 12:42   Ct Chest Wo Contrast  11/15/2015  CLINICAL DATA:  Anemia and elevated white blood cell count following cholecystectomy on 11/06/2015. Generalized weakness. History of end-stage renal disease and right nephrectomy for cancer. EXAM: CT CHEST, ABDOMEN AND PELVIS WITHOUT  CONTRAST TECHNIQUE: Multidetector CT imaging of the chest, abdomen and pelvis was performed following the standard protocol without IV contrast. COMPARISON:  CT abdomen and pelvis 11/05/2015. Chest radiographs 11/15/2015 and 11/04/2015. FINDINGS: CT CHEST No enlarged axillary, mediastinal, or hilar lymph nodes are identified. There is advanced aortic and coronary artery atherosclerosis. The heart is enlarged. There may be a trace right pleural effusion. Moderate volume right lower lobe consolidation is present with associated air bronchograms. Minimal dependent atelectasis is noted in the left lower lobe. Major airways are patent. No suspicious lytic or blastic osseous lesion is identified. Thoracic spondylosis is noted. CT ABDOMEN AND PELVIS Sequelae of interval cholecystectomy are identified. There is a hematoma extending from the region of the gallbladder fossa inferiorly along the right hepatic lobe and into the pericolic gutter. This hematoma measures approximately 12 x 6 x 8 cm. A surgical drain courses through the hematoma. There is mild mass effect on the inferior right hepatic lobe. Small volume blood products are also noted more anteriorly and laterally in the right lower quadrant as well as in the left upper quadrant. The spleen, right adrenal gland, and pancreas are unremarkable. 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 There is a nonobstructing calculus in the interpolar right kidney. No left hydronephrosis. Wall thickening involving the duodenum appears improved from the prior CT. Oral contrast is present in multiple loops of proximal small bowel, and there is a similar appearance of mild proximal small bowel dilatation up to 3.5 cm in diameter. Gas is present in more distal small bowel and colon. No evidence of appendicitis. Bladder and prostate are unremarkable. Diffuse atherosclerotic calcification is noted involving the abdominal aorta and its major branch vessels. No enlarged lymph nodes  are identified. Disc bulging with annular calcification at L4-5 and L5-S1. IMPRESSION: 1.  Interval cholecystectomy with 12 cm hematoma in the gallbladder fossa and about the inferior right hepatic lobe. The surgical drain courses through this hematoma. 2. Persistent mild small bowel dilatation, query ileus. 3. Right lower lobe consolidation consistent with pneumonia. These results will be called to the ordering clinician or representative by the Radiologist Assistant, and communication documented in the PACS or zVision Dashboard. Electronically Signed   By: Logan Bores M.D.   On: 11/15/2015 17:22    Microbiology: Recent Results (from the past 240 hour(s))  Culture, blood (Routine X 2) w Reflex to ID Panel     Status: None (Preliminary result)   Collection Time: 11/15/15  6:10 PM  Result Value Ref Range Status   Specimen Description BLOOD RIGHT ANTECUBITAL  Final   Special Requests BOTTLES DRAWN AEROBIC AND ANAEROBIC 10CC  Final   Culture NO GROWTH 4 DAYS  Final   Report Status PENDING  Incomplete  Culture, blood (Routine X 2) w Reflex to ID Panel     Status: None (Preliminary result)   Collection Time: 11/15/15  6:20 PM  Result Value Ref Range Status   Specimen Description BLOOD RIGHT ARM  Final   Special Requests BOTTLES DRAWN AEROBIC AND ANAEROBIC 10CC  Final   Culture NO GROWTH 4 DAYS  Final   Report Status PENDING  Incomplete  MRSA PCR Screening     Status: None   Collection Time: 11/15/15  8:27 PM  Result Value Ref Range Status   MRSA by PCR NEGATIVE NEGATIVE Final    Comment:        The GeneXpert MRSA Assay (FDA approved for NASAL specimens only), is one component of a comprehensive MRSA colonization surveillance program. It is not intended to diagnose MRSA infection nor to guide or monitor treatment for MRSA infections.   Urine culture     Status: Abnormal   Collection Time: 11/15/15  9:55 PM  Result Value Ref Range Status   Specimen Description URINE, CLEAN CATCH  Final    Special Requests NONE  Final   Culture 3,000 COLONIES/mL INSIGNIFICANT GROWTH (A)  Final   Report Status 11/17/2015 FINAL  Final     Labs: Basic Metabolic Panel:  Recent Labs Lab 11/15/15 1202 11/15/15 1820 11/16/15 0239 11/16/15 0800 11/17/15 0323 11/18/15 0540  NA 138  --  139 140 141 142  K 3.2*  --  3.3* 3.6 3.4* 3.8  CL 96*  --  98* 98* 98* 98*  CO2 24  --  23 23 24 26   GLUCOSE 96  --  79 90 88 109*  BUN 55*  --  61* 65* 75* 46*  CREATININE 8.79*  --  9.47* 9.65* 11.12* 7.25*  CALCIUM 7.8*  --  7.3* 7.4* 7.6* 8.0*  MG  --  2.2  --   --   --   --   PHOS  --   --   --  10.0*  --   --    Liver Function Tests:  Recent Labs Lab 11/15/15 1202 11/16/15 0239 11/16/15 0800 11/17/15 0323 11/18/15 0540  AST 34 36  --  34 37  ALT 38 43  --  46 48  ALKPHOS 220* 236*  --  291* 252*  BILITOT 0.8 0.9  --  1.2 0.6  PROT 5.3* 5.6*  --  6.2* 5.8*  ALBUMIN 2.1* 2.0* 2.1* 2.1* 1.9*    Recent Labs Lab 11/15/15 1202  LIPASE 85*   No results for input(s): AMMONIA in the last  168 hours. CBC:  Recent Labs Lab 11/15/15 1202  11/16/15 0801 11/17/15 0323 11/18/15 0540 11/19/15 0540 11/20/15 0357  WBC 27.2*  < > 16.4* 17.3* 10.3 11.2* 11.5*  NEUTROABS 24.3*  --   --   --   --   --   --   HGB 6.9*  < > 8.2* 8.4* 7.9* 7.8* 7.9*  HCT 20.6*  < > 25.1* 26.7* 25.3* 25.3* 25.3*  MCV 80.5  < > 81.8 79.7 81.9 82.4 84.1  PLT 575*  < > 603* 743* 733* 815* 754*  < > = values in this interval not displayed. Cardiac Enzymes:  Recent Labs Lab 11/15/15 1202 11/15/15 1820 11/16/15 0239  TROPONINI 0.05* 0.06* 0.10*   BNP: BNP (last 3 results)  Recent Labs  11/04/15 0837  BNP 192.0*    ProBNP (last 3 results) No results for input(s): PROBNP in the last 8760 hours.  CBG: No results for input(s): GLUCAP in the last 168 hours.     Signed:  Vernell Leep, MD, FACP, FHM. [name] Pager [phone]  If 7PM-7AM, please contact  night-coverage [email or web address] Password [facility] 11/20/2015, 11:17 AM

## 2015-11-20 NOTE — Progress Notes (Signed)
Patient discharged to home with family. PIV removed per protocol. Discharge instructions reviewed. Follow up appts already scheduled by patient's spouse. Patient's spouse to provide transportation home.  Joellen Jersey, RN.

## 2015-11-20 NOTE — Discharge Instructions (Signed)
Ok to shower  Hematoma A hematoma is a collection of blood under the skin, in an organ, in a body space, in a joint space, or in other tissue. The blood can clot to form a lump that you can see and feel. The lump is often firm and may sometimes become sore and tender. Most hematomas get better in a few days to weeks. However, some hematomas may be serious and require medical care. Hematomas can range in size from very small to very large. CAUSES  A hematoma can be caused by a blunt or penetrating injury. It can also be caused by spontaneous leakage from a blood vessel under the skin. Spontaneous leakage from a blood vessel is more likely to occur in older people, especially those taking blood thinners. Sometimes, a hematoma can develop after certain medical procedures. SIGNS AND SYMPTOMS   A firm lump on the body.  Possible pain and tenderness in the area.  Bruising.Blue, dark blue, purple-red, or yellowish skin may appear at the site of the hematoma if the hematoma is close to the surface of the skin. For hematomas in deeper tissues or body spaces, the signs and symptoms may be subtle. For example, an intra-abdominal hematoma may cause abdominal pain, weakness, fainting, and shortness of breath. An intracranial hematoma may cause a headache or symptoms such as weakness, trouble speaking, or a change in consciousness. DIAGNOSIS  A hematoma can usually be diagnosed based on your medical history and a physical exam. Imaging tests may be needed if your health care provider suspects a hematoma in deeper tissues or body spaces, such as the abdomen, head, or chest. These tests may include ultrasonography or a CT scan.  TREATMENT  Hematomas usually go away on their own over time. Rarely does the blood need to be drained out of the body. Large hematomas or those that may affect vital organs will sometimes need surgical drainage or monitoring. HOME CARE INSTRUCTIONS   Apply ice to the injured area:    Put ice in a plastic bag.   Place a towel between your skin and the bag.   Leave the ice on for 20 minutes, 2-3 times a day for the first 1 to 2 days.   After the first 2 days, switch to using warm compresses on the hematoma.   Elevate the injured area to help decrease pain and swelling. Wrapping the area with an elastic bandage may also be helpful. Compression helps to reduce swelling and promotes shrinking of the hematoma. Make sure the bandage is not wrapped too tight.   If your hematoma is on a lower extremity and is painful, crutches may be helpful for a couple days.   Only take over-the-counter or prescription medicines as directed by your health care provider. SEEK IMMEDIATE MEDICAL CARE IF:   You have increasing pain, or your pain is not controlled with medicine.   You have a fever.   You have worsening swelling or discoloration.   Your skin over the hematoma breaks or starts bleeding.   Your hematoma is in your chest or abdomen and you have weakness, shortness of breath, or a change in consciousness.  Your hematoma is on your scalp (caused by a fall or injury) and you have a worsening headache or a change in alertness or consciousness. MAKE SURE YOU:   Understand these instructions.  Will watch your condition.  Will get help right away if you are not doing well or get worse.   This information is  not intended to replace advice given to you by your health care provider. Make sure you discuss any questions you have with your health care provider.   Document Released: 01/30/2004 Document Revised: 02/17/2013 Document Reviewed: 11/25/2012 Elsevier Interactive Patient Education 2016 [place]  Community-Acquired Pneumonia, Adult Pneumonia is an infection of the lungs. There are different types of pneumonia. One type can develop while a person is in a hospital. A different type, called community-acquired pneumonia, develops in people who are not, or have  not recently been, in the hospital or other health care facility.  CAUSES Pneumonia may be caused by bacteria, viruses, or funguses. Community-acquired pneumonia is often caused by Streptococcus pneumonia bacteria. These bacteria are often passed from one person to another by breathing in droplets from the cough or sneeze of an infected person. RISK FACTORS The condition is more likely to develop in:  People who havechronic diseases, such as chronic obstructive pulmonary disease (COPD), asthma, congestive heart failure, cystic fibrosis, diabetes, or kidney disease.  People who haveearly-stage or late-stage HIV.  People who havesickle cell disease.  People who havehad their spleen removed (splenectomy).  People who havepoor Human resources officer.  People who havemedical conditions that increase the risk of breathing in (aspirating) secretions their own mouth and nose.   People who havea weakened immune system (immunocompromised).  People who smoke.  People whotravel to areas where pneumonia-causing germs commonly exist.  People whoare around animal habitats or animals that have pneumonia-causing germs, including birds, bats, rabbits, cats, and farm animals. SYMPTOMS Symptoms of this condition include:  Adry cough.  A wet (productive) cough.  Fever.  Sweating.  Chest pain, especially when breathing deeply or coughing.  Rapid breathing or difficulty breathing.  Shortness of breath.  Shaking chills.  Fatigue.  Muscle aches. DIAGNOSIS Your health care provider will take a medical history and perform a physical exam. You may also have other tests, including:  Imaging studies of your chest, including X-rays.  Tests to check your blood oxygen level and other blood gases.  Other tests on blood, mucus (sputum), fluid around your lungs (pleural fluid), and urine. If your pneumonia is severe, other tests may be done to identify the specific cause of your  illness. TREATMENT The type of treatment that you receive depends on many factors, such as the cause of your pneumonia, the medicines you take, and other medical conditions that you have. For most adults, treatment and recovery from pneumonia may occur at home. In some cases, treatment must happen in a hospital. Treatment may include:  Antibiotic medicines, if the pneumonia was caused by bacteria.  Antiviral medicines, if the pneumonia was caused by a virus.  Medicines that are given by mouth or through an IV tube.  Oxygen.  Respiratory therapy. Although rare, treating severe pneumonia may include:  Mechanical ventilation. This is done if you are not breathing well on your own and you cannot maintain a safe blood oxygen level.  Thoracentesis. This procedureremoves fluid around one lung or both lungs to help you breathe better. HOME CARE INSTRUCTIONS  Take over-the-counter and prescription medicines only as told by your health care provider.  Only takecough medicine if you are losing sleep. Understand that cough medicine can prevent your body's natural ability to remove mucus from your lungs.  If you were prescribed an antibiotic medicine, take it as told by your health care provider. Do not stop taking the antibiotic even if you start to feel better.  Sleep in a semi-upright position  at night. Try sleeping in a reclining chair, or place a few pillows under your head.  Do not use tobacco products, including cigarettes, chewing tobacco, and e-cigarettes. If you need help quitting, ask your health care provider.  Drink enough water to keep your urine clear or pale yellow. This will help to thin out mucus secretions in your lungs. PREVENTION There are ways that you can decrease your risk of developing community-acquired pneumonia. Consider getting a pneumococcal vaccine if:  You are older than 44 years of age.  You are older than 44 years of age and are undergoing cancer treatment,  have chronic lung disease, or have other medical conditions that affect your immune system. Ask your health care provider if this applies to you. There are different types and schedules of pneumococcal vaccines. Ask your health care provider which vaccination option is best for you. You may also prevent community-acquired pneumonia if you take these actions:  Get an influenza vaccine every year. Ask your health care provider which type of influenza vaccine is best for you.  Go to the dentist on a regular basis.  Wash your hands often. Use hand sanitizer if soap and water are not available. SEEK MEDICAL CARE IF:  You have a fever.  You are losing sleep because you cannot control your cough with cough medicine. SEEK IMMEDIATE MEDICAL CARE IF:  You have worsening shortness of breath.  You have increased chest pain.  Your sickness becomes worse, especially if you are an older adult or have a weakened immune system.  You cough up blood.   This information is not intended to replace advice given to you by your health care provider. Make sure you discuss any questions you have with your health care provider.   Document Released: 06/17/2005 Document Revised: 03/08/2015 Document Reviewed: 10/12/2014 Elsevier Interactive Patient Education [employer].

## 2015-12-29 ENCOUNTER — Ambulatory Visit
Admission: RE | Admit: 2015-12-29 | Discharge: 2015-12-29 | Disposition: A | Discharge status: Home / Self Care | Payer: Medicare Other | Source: Ambulatory Visit | Attending: Nephrology | Admitting: Nephrology

## 2015-12-29 ENCOUNTER — Other Ambulatory Visit: Payer: Self-pay | Admitting: Nephrology

## 2015-12-29 DIAGNOSIS — Z09 Encounter for follow-up examination after completed treatment for conditions other than malignant neoplasm: Secondary | ICD-10-CM

## 2016-02-21 ENCOUNTER — Other Ambulatory Visit: Payer: Self-pay | Admitting: Nephrology

## 2016-02-21 DIAGNOSIS — N200 Calculus of kidney: Secondary | ICD-10-CM

## 2016-02-26 ENCOUNTER — Other Ambulatory Visit: Payer: Medicare Other

## 2016-07-12 ENCOUNTER — Ambulatory Visit (INDEPENDENT_AMBULATORY_CARE_PROVIDER_SITE_OTHER): Payer: Medicare Other | Admitting: Podiatry

## 2016-07-12 ENCOUNTER — Ambulatory Visit (INDEPENDENT_AMBULATORY_CARE_PROVIDER_SITE_OTHER): Payer: Medicare Other

## 2016-07-12 ENCOUNTER — Encounter: Payer: Self-pay | Admitting: Podiatry

## 2016-07-12 VITALS — BP 218/89 | HR 91 | Resp 16 | Ht 70.0 in | Wt 220.0 lb

## 2016-07-12 DIAGNOSIS — M79671 Pain in right foot: Secondary | ICD-10-CM | POA: Diagnosis not present

## 2016-07-12 DIAGNOSIS — I999 Unspecified disorder of circulatory system: Secondary | ICD-10-CM

## 2016-07-12 DIAGNOSIS — M21621 Bunionette of right foot: Secondary | ICD-10-CM | POA: Diagnosis not present

## 2016-07-12 NOTE — Progress Notes (Signed)
   Subjective:    Patient ID: Hayden [name], male    DOB: 1970-07-18, 46 y.o.   MRN: [id number]  HPI Chief Complaint  Patient presents with  . Foot Pain    Right foot; toes & plantar forefoot; pt stated, "Has numbenss in toes and ball of foot; numbness radiates down to bottom of foot"      Review of Systems  Constitutional: Positive for activity change and unexpected weight change.  Cardiovascular: Positive for leg swelling.  Gastrointestinal: Positive for abdominal distention, diarrhea and nausea.  Endocrine: Positive for cold intolerance.  Musculoskeletal: Positive for back pain.  All other systems reviewed and are negative.      Objective:   Physical Exam        Assessment & Plan:

## 2016-07-15 NOTE — Progress Notes (Signed)
Subjective:     Patient ID: Hayden [name], male   DOB: [birth year], 46 y.o.   MRN: [id number]  HPI patient states I'm getting pain in the outside of my right foot with redness that makes it hard for me to wear shoe gear comfortably. Patient states that it started recently and she's not noted drainage   Review of Systems  All other systems reviewed and are negative.      Objective:   Physical Exam  Constitutional: He is oriented to person, place, and time.  Cardiovascular: Intact distal pulses.   Musculoskeletal: Normal range of motion.  Neurological: He is oriented to person, place, and time.  Skin: Skin is warm.  Nursing note and vitals reviewed.  neurovascular status intact muscle strength adequate range of motion within normal limits with patient noted to have inflammation and irritation on the outside of the fifth metatarsal right with moderate enlargement of the bone structure. Patient's found have good digital perfusion and is well oriented 3     Assessment:     Small Taylor's bunion deformity right with inflammation fluid with possibility for low-grade vascular issues    Plan:     H&P condition reviewed and discussed if any redness drainage or proximal inflammation should occur to let us know immediately. I've recommended soaks padding around the area and dispensed surgical shoe to keep pressure off the side of the foot and hopefully this will resolve. Discussed is very high blood pressure and he is seen a physician for it on a regular basis  X-ray report indicates that there is structural enlargement around the fifth metatarsal head right with no indications of soft tissue

## 2016-10-23 ENCOUNTER — Inpatient Hospital Stay (HOSPITAL_COMMUNITY)
Admission: EM | Admit: 2016-10-23 | Discharge: 2016-11-20 | DRG: 239 | Disposition: A | Discharge status: Rehabilitation Facility | Payer: Medicare Other | Attending: Internal Medicine | Admitting: Internal Medicine

## 2016-10-23 ENCOUNTER — Emergency Department (HOSPITAL_COMMUNITY): Payer: Medicare Other

## 2016-10-23 ENCOUNTER — Encounter (HOSPITAL_COMMUNITY): Payer: Self-pay

## 2016-10-23 DIAGNOSIS — K219 Gastro-esophageal reflux disease without esophagitis: Secondary | ICD-10-CM | POA: Diagnosis present

## 2016-10-23 DIAGNOSIS — D72829 Elevated white blood cell count, unspecified: Secondary | ICD-10-CM | POA: Diagnosis not present

## 2016-10-23 DIAGNOSIS — Z720 Tobacco use: Secondary | ICD-10-CM

## 2016-10-23 DIAGNOSIS — I160 Hypertensive urgency: Secondary | ICD-10-CM | POA: Diagnosis present

## 2016-10-23 DIAGNOSIS — R410 Disorientation, unspecified: Secondary | ICD-10-CM

## 2016-10-23 DIAGNOSIS — Z85528 Personal history of other malignant neoplasm of kidney: Secondary | ICD-10-CM

## 2016-10-23 DIAGNOSIS — E119 Type 2 diabetes mellitus without complications: Secondary | ICD-10-CM

## 2016-10-23 DIAGNOSIS — N186 End stage renal disease: Secondary | ICD-10-CM

## 2016-10-23 DIAGNOSIS — L8990 Pressure ulcer of unspecified site, unspecified stage: Secondary | ICD-10-CM | POA: Insufficient documentation

## 2016-10-23 DIAGNOSIS — Z79899 Other long term (current) drug therapy: Secondary | ICD-10-CM

## 2016-10-23 DIAGNOSIS — G9341 Metabolic encephalopathy: Secondary | ICD-10-CM | POA: Diagnosis present

## 2016-10-23 DIAGNOSIS — R9431 Abnormal electrocardiogram [ECG] [EKG]: Secondary | ICD-10-CM

## 2016-10-23 DIAGNOSIS — F32A Depression, unspecified: Secondary | ICD-10-CM | POA: Diagnosis present

## 2016-10-23 DIAGNOSIS — E46 Unspecified protein-calorie malnutrition: Secondary | ICD-10-CM | POA: Diagnosis present

## 2016-10-23 DIAGNOSIS — Z992 Dependence on renal dialysis: Secondary | ICD-10-CM | POA: Diagnosis not present

## 2016-10-23 DIAGNOSIS — E1122 Type 2 diabetes mellitus with diabetic chronic kidney disease: Secondary | ICD-10-CM | POA: Diagnosis present

## 2016-10-23 DIAGNOSIS — L03031 Cellulitis of right toe: Secondary | ICD-10-CM

## 2016-10-23 DIAGNOSIS — E785 Hyperlipidemia, unspecified: Secondary | ICD-10-CM | POA: Diagnosis present

## 2016-10-23 DIAGNOSIS — E1152 Type 2 diabetes mellitus with diabetic peripheral angiopathy with gangrene: Principal | ICD-10-CM | POA: Diagnosis present

## 2016-10-23 DIAGNOSIS — I96 Gangrene, not elsewhere classified: Secondary | ICD-10-CM

## 2016-10-23 DIAGNOSIS — Z781 Physical restraint status: Secondary | ICD-10-CM

## 2016-10-23 DIAGNOSIS — L03115 Cellulitis of right lower limb: Secondary | ICD-10-CM | POA: Diagnosis not present

## 2016-10-23 DIAGNOSIS — D631 Anemia in chronic kidney disease: Secondary | ICD-10-CM | POA: Diagnosis present

## 2016-10-23 DIAGNOSIS — E875 Hyperkalemia: Secondary | ICD-10-CM | POA: Diagnosis present

## 2016-10-23 DIAGNOSIS — I272 Pulmonary hypertension, unspecified: Secondary | ICD-10-CM | POA: Diagnosis present

## 2016-10-23 DIAGNOSIS — K5900 Constipation, unspecified: Secondary | ICD-10-CM | POA: Diagnosis not present

## 2016-10-23 DIAGNOSIS — Z8673 Personal history of transient ischemic attack (TIA), and cerebral infarction without residual deficits: Secondary | ICD-10-CM

## 2016-10-23 DIAGNOSIS — F329 Major depressive disorder, single episode, unspecified: Secondary | ICD-10-CM | POA: Diagnosis present

## 2016-10-23 DIAGNOSIS — G9340 Encephalopathy, unspecified: Secondary | ICD-10-CM | POA: Diagnosis not present

## 2016-10-23 DIAGNOSIS — E669 Obesity, unspecified: Secondary | ICD-10-CM | POA: Diagnosis present

## 2016-10-23 DIAGNOSIS — G8918 Other acute postprocedural pain: Secondary | ICD-10-CM

## 2016-10-23 DIAGNOSIS — R451 Restlessness and agitation: Secondary | ICD-10-CM | POA: Diagnosis not present

## 2016-10-23 DIAGNOSIS — R079 Chest pain, unspecified: Secondary | ICD-10-CM

## 2016-10-23 DIAGNOSIS — I10 Essential (primary) hypertension: Secondary | ICD-10-CM

## 2016-10-23 DIAGNOSIS — F419 Anxiety disorder, unspecified: Secondary | ICD-10-CM | POA: Diagnosis not present

## 2016-10-23 DIAGNOSIS — Z95828 Presence of other vascular implants and grafts: Secondary | ICD-10-CM

## 2016-10-23 DIAGNOSIS — N2581 Secondary hyperparathyroidism of renal origin: Secondary | ICD-10-CM | POA: Diagnosis present

## 2016-10-23 DIAGNOSIS — I7092 Chronic total occlusion of artery of the extremities: Secondary | ICD-10-CM | POA: Diagnosis present

## 2016-10-23 DIAGNOSIS — D638 Anemia in other chronic diseases classified elsewhere: Secondary | ICD-10-CM

## 2016-10-23 DIAGNOSIS — I132 Hypertensive heart and chronic kidney disease with heart failure and with stage 5 chronic kidney disease, or end stage renal disease: Secondary | ICD-10-CM | POA: Diagnosis present

## 2016-10-23 DIAGNOSIS — E1165 Type 2 diabetes mellitus with hyperglycemia: Secondary | ICD-10-CM | POA: Diagnosis present

## 2016-10-23 DIAGNOSIS — Z4659 Encounter for fitting and adjustment of other gastrointestinal appliance and device: Secondary | ICD-10-CM

## 2016-10-23 DIAGNOSIS — G253 Myoclonus: Secondary | ICD-10-CM | POA: Diagnosis present

## 2016-10-23 DIAGNOSIS — J9601 Acute respiratory failure with hypoxia: Secondary | ICD-10-CM

## 2016-10-23 DIAGNOSIS — F17210 Nicotine dependence, cigarettes, uncomplicated: Secondary | ICD-10-CM | POA: Diagnosis present

## 2016-10-23 DIAGNOSIS — L97519 Non-pressure chronic ulcer of other part of right foot with unspecified severity: Secondary | ICD-10-CM | POA: Diagnosis present

## 2016-10-23 DIAGNOSIS — Z7982 Long term (current) use of aspirin: Secondary | ICD-10-CM

## 2016-10-23 DIAGNOSIS — R569 Unspecified convulsions: Secondary | ICD-10-CM | POA: Diagnosis not present

## 2016-10-23 DIAGNOSIS — E876 Hypokalemia: Secondary | ICD-10-CM | POA: Diagnosis not present

## 2016-10-23 DIAGNOSIS — I43 Cardiomyopathy in diseases classified elsewhere: Secondary | ICD-10-CM | POA: Diagnosis present

## 2016-10-23 DIAGNOSIS — Z6827 Body mass index (BMI) 27.0-27.9, adult: Secondary | ICD-10-CM

## 2016-10-23 DIAGNOSIS — F23 Brief psychotic disorder: Secondary | ICD-10-CM | POA: Diagnosis not present

## 2016-10-23 DIAGNOSIS — R748 Abnormal levels of other serum enzymes: Secondary | ICD-10-CM | POA: Diagnosis present

## 2016-10-23 DIAGNOSIS — E11621 Type 2 diabetes mellitus with foot ulcer: Secondary | ICD-10-CM | POA: Diagnosis present

## 2016-10-23 DIAGNOSIS — I5042 Chronic combined systolic (congestive) and diastolic (congestive) heart failure: Secondary | ICD-10-CM | POA: Diagnosis present

## 2016-10-23 LAB — COMPREHENSIVE METABOLIC PANEL
ALBUMIN: 2.6 g/dL — AB (ref 3.5–5.0)
ALK PHOS: 264 U/L — AB (ref 38–126)
ALT: 49 U/L (ref 17–63)
AST: 28 U/L (ref 15–41)
Anion gap: 18 — ABNORMAL HIGH (ref 5–15)
BILIRUBIN TOTAL: 1.2 mg/dL (ref 0.3–1.2)
BUN: 102 mg/dL — AB (ref 6–20)
CO2: 21 mmol/L — ABNORMAL LOW (ref 22–32)
CREATININE: 13.79 mg/dL — AB (ref 0.61–1.24)
Calcium: 7.7 mg/dL — ABNORMAL LOW (ref 8.9–10.3)
Chloride: 98 mmol/L — ABNORMAL LOW (ref 101–111)
GFR calc Af Amer: 4 mL/min — ABNORMAL LOW (ref >60)
GFR, EST NON AFRICAN AMERICAN: 4 mL/min — AB (ref >60)
Glucose, Bld: 106 mg/dL — ABNORMAL HIGH (ref 65–99)
Potassium: 5.4 mmol/L — ABNORMAL HIGH (ref 3.5–5.1)
Sodium: 137 mmol/L (ref 135–145)
TOTAL PROTEIN: 6.4 g/dL — AB (ref 6.5–8.1)

## 2016-10-23 LAB — CBC WITH DIFFERENTIAL/PLATELET
BASOS ABS: 0.2 10*3/uL — AB (ref 0.0–0.1)
BASOS PCT: 1 %
Eosinophils Absolute: 0.5 10*3/uL (ref 0.0–0.7)
Eosinophils Relative: 4 %
HEMATOCRIT: 35.3 % — AB (ref 39.0–52.0)
HEMOGLOBIN: 11.3 g/dL — AB (ref 13.0–17.0)
LYMPHS PCT: 5 %
Lymphs Abs: 0.8 10*3/uL (ref 0.7–4.0)
MCH: 24.8 pg — ABNORMAL LOW (ref 26.0–34.0)
MCHC: 32 g/dL (ref 30.0–36.0)
MCV: 77.4 fL — AB (ref 78.0–100.0)
MONO ABS: 0.9 10*3/uL (ref 0.1–1.0)
Monocytes Relative: 6 %
NEUTROS ABS: 12.3 10*3/uL — AB (ref 1.7–7.7)
NEUTROS PCT: 84 %
Platelets: 427 10*3/uL — ABNORMAL HIGH (ref 150–400)
RBC: 4.56 MIL/uL (ref 4.22–5.81)
RDW: 18.7 % — ABNORMAL HIGH (ref 11.5–15.5)
WBC: 14.6 10*3/uL — AB (ref 4.0–10.5)

## 2016-10-23 LAB — I-STAT CG4 LACTIC ACID, ED
LACTIC ACID, VENOUS: 0.73 mmol/L (ref 0.5–1.9)
LACTIC ACID, VENOUS: 0.45 mmol/L — AB (ref 0.5–1.9)

## 2016-10-23 MED ORDER — HYDRALAZINE HCL 20 MG/ML IJ SOLN
10.0000 mg | INTRAMUSCULAR | Status: DC | PRN
  Administered 2016-10-23 – 2016-11-15 (×4): 10 mg via INTRAVENOUS
  Filled 2016-10-23 (×4): qty 1

## 2016-10-23 MED ORDER — HEPARIN 1000 UNIT/ML FOR PERITONEAL DIALYSIS
500.0000 [IU] | INTRAMUSCULAR | Status: DC | PRN
Start: 2016-10-23 — End: 2016-10-28
  Filled 2016-10-23: qty 0.5

## 2016-10-23 MED ORDER — CLONIDINE HCL 0.2 MG/24HR TD PTWK
0.2000 mg | MEDICATED_PATCH | TRANSDERMAL | Status: DC
  Administered 2016-10-24 – 2016-10-30 (×2): 0.2 mg via TRANSDERMAL
  Filled 2016-10-23 (×2): qty 1

## 2016-10-23 MED ORDER — HYDRALAZINE HCL 25 MG PO TABS
100.0000 mg | ORAL_TABLET | Freq: Two times a day (BID) | ORAL | Status: DC
  Administered 2016-10-23 – 2016-11-04 (×25): 100 mg via ORAL
  Filled 2016-10-23 (×24): qty 4

## 2016-10-23 MED ORDER — ALBUTEROL SULFATE (2.5 MG/3ML) 0.083% IN NEBU: 2.5000 mg | INHALATION_SOLUTION | RESPIRATORY_TRACT | Status: DC | PRN

## 2016-10-23 MED ORDER — ONDANSETRON HCL 4 MG PO TABS: 4.0000 mg | ORAL_TABLET | Freq: Four times a day (QID) | ORAL | Status: DC | PRN

## 2016-10-23 MED ORDER — CALCIUM ACETATE (PHOS BINDER) 667 MG PO CAPS
1334.0000 mg | ORAL_CAPSULE | Freq: Three times a day (TID) | ORAL | Status: DC
  Administered 2016-10-24 – 2016-11-04 (×25): 1334 mg via ORAL
  Filled 2016-10-23 (×30): qty 2

## 2016-10-23 MED ORDER — ONDANSETRON HCL 4 MG/2ML IJ SOLN
4.0000 mg | Freq: Four times a day (QID) | INTRAMUSCULAR | Status: DC | PRN
  Administered 2016-10-30: 4 mg via INTRAVENOUS
  Filled 2016-10-23: qty 2

## 2016-10-23 MED ORDER — ACETAMINOPHEN 650 MG RE SUPP: 650.0000 mg | Freq: Four times a day (QID) | RECTAL | Status: DC | PRN

## 2016-10-23 MED ORDER — SACCHAROMYCES BOULARDII 250 MG PO CAPS
250.0000 mg | ORAL_CAPSULE | Freq: Two times a day (BID) | ORAL | Status: DC
  Administered 2016-10-24 – 2016-11-04 (×23): 250 mg via ORAL
  Filled 2016-10-23 (×24): qty 1

## 2016-10-23 MED ORDER — HEPARIN SODIUM (PORCINE) 5000 UNIT/ML IJ SOLN
5000.0000 [IU] | Freq: Three times a day (TID) | INTRAMUSCULAR | Status: DC
  Administered 2016-10-23 – 2016-10-26 (×7): 5000 [IU] via SUBCUTANEOUS
  Filled 2016-10-23 (×11): qty 1

## 2016-10-23 MED ORDER — LABETALOL HCL 300 MG PO TABS
150.0000 mg | ORAL_TABLET | Freq: Two times a day (BID) | ORAL | Status: DC
  Administered 2016-10-24 – 2016-10-25 (×4): 150 mg via ORAL
  Filled 2016-10-23 (×4): qty 1

## 2016-10-23 MED ORDER — ASPIRIN EC 81 MG PO TBEC
81.0000 mg | DELAYED_RELEASE_TABLET | Freq: Every day | ORAL | Status: DC
  Administered 2016-10-24 – 2016-11-04 (×12): 81 mg via ORAL
  Filled 2016-10-23 (×12): qty 1

## 2016-10-23 MED ORDER — OXYCODONE HCL 5 MG PO TABS
5.0000 mg | ORAL_TABLET | ORAL | Status: DC | PRN
  Administered 2016-10-23: 5 mg via ORAL
  Filled 2016-10-23: qty 1

## 2016-10-23 MED ORDER — FENTANYL CITRATE (PF) 100 MCG/2ML IJ SOLN
50.0000 ug | Freq: Once | INTRAMUSCULAR | Status: AC
  Administered 2016-10-23: 50 ug via INTRAVENOUS
  Filled 2016-10-23: qty 2

## 2016-10-23 MED ORDER — DELFLEX-LC/2.5% DEXTROSE 394 MOSM/L IP SOLN
Freq: Every day | INTRAPERITONEAL | Status: DC
  Administered 2016-10-24: 15000 L via INTRAPERITONEAL

## 2016-10-23 MED ORDER — SODIUM CHLORIDE 0.9 % IV SOLN
2000.0000 mg | Freq: Once | INTRAVENOUS | Status: AC
  Administered 2016-10-23: 2000 mg via INTRAVENOUS
  Filled 2016-10-23: qty 2000

## 2016-10-23 MED ORDER — CALCITRIOL 0.25 MCG PO CAPS
0.5000 ug | ORAL_CAPSULE | Freq: Every day | ORAL | Status: DC
  Administered 2016-10-24 – 2016-11-04 (×12): 0.5 ug via ORAL
  Filled 2016-10-23 (×12): qty 1

## 2016-10-23 MED ORDER — SACCHAROMYCES BOULARDII 250 MG PO CAPS
250.0000 mg | ORAL_CAPSULE | Freq: Two times a day (BID) | ORAL | Status: DC
  Filled 2016-10-23: qty 1

## 2016-10-23 MED ORDER — GENTAMICIN SULFATE 0.1 % EX CREA
1.0000 "application " | TOPICAL_CREAM | Freq: Every day | CUTANEOUS | Status: DC
  Administered 2016-10-27: 1 via TOPICAL
  Filled 2016-10-23 (×2): qty 15

## 2016-10-23 MED ORDER — ALPRAZOLAM 0.5 MG PO TABS
1.0000 mg | ORAL_TABLET | Freq: Every evening | ORAL | Status: DC | PRN
Start: 2016-10-23 — End: 2016-10-25
  Administered 2016-10-23: 1 mg via ORAL
  Filled 2016-10-23: qty 2

## 2016-10-23 MED ORDER — PIPERACILLIN-TAZOBACTAM 3.375 G IVPB
3.3750 g | Freq: Two times a day (BID) | INTRAVENOUS | Status: DC
  Administered 2016-10-24 – 2016-10-30 (×15): 3.375 g via INTRAVENOUS
  Filled 2016-10-23 (×15): qty 50

## 2016-10-23 MED ORDER — CINACALCET HCL 30 MG PO TABS
60.0000 mg | ORAL_TABLET | Freq: Every day | ORAL | Status: DC
  Administered 2016-10-24 – 2016-11-02 (×9): 60 mg via ORAL
  Filled 2016-10-23 (×12): qty 2

## 2016-10-23 MED ORDER — HYDRALAZINE HCL 25 MG PO TABS
100.0000 mg | ORAL_TABLET | Freq: Once | ORAL | Status: AC
Start: 2016-10-23 — End: 2016-10-23
  Administered 2016-10-23: 100 mg via ORAL
  Filled 2016-10-23: qty 4

## 2016-10-23 MED ORDER — PIPERACILLIN-TAZOBACTAM 3.375 G IVPB 30 MIN
3.3750 g | Freq: Once | INTRAVENOUS | Status: AC
  Administered 2016-10-23: 3.375 g via INTRAVENOUS
  Filled 2016-10-23: qty 50

## 2016-10-23 MED ORDER — ACETAMINOPHEN 325 MG PO TABS
650.0000 mg | ORAL_TABLET | Freq: Four times a day (QID) | ORAL | Status: DC | PRN
  Administered 2016-10-30: 650 mg via ORAL
  Filled 2016-10-23 (×2): qty 2

## 2016-10-23 MED ORDER — LABETALOL HCL 300 MG PO TABS
150.0000 mg | ORAL_TABLET | Freq: Once | ORAL | Status: AC
  Administered 2016-10-23: 150 mg via ORAL
  Filled 2016-10-23: qty 0.5

## 2016-10-23 NOTE — Consult Note (Signed)
Reason for Consult: ESRD Referring Physician:  Dr. Fuller Plan  Chief Complaint: Foot pain  Assessment/Plan: 1 Cellulitis - w/ dry gangrene vs non healing ulceration on the 1st digit in the right foot. Good DP and PT; may need debridement as well. May be worthwhile to obtain lower extremity duplex studies to confirm adequate arterial flow. Pt being treated with Vancomycin 2gm load and redose when random levels are under 20 by Pharmacy. 2 ESRD: Home therapies with PD started ~3 months ago w/ Dr. Moshe Cipro. 3L fill 5 exchanges at night with 5th fill manually drained 1-1/2 hours later. No evidence of peritonitis and we will have the dialysis nurse start CCPD tonight. 2.5% (occasionally usally 4.25%). Still has a beautiful left Cimino with a great thrill. 3 Hypertension: May restart home anti-hypertensives and should improve with UF tonight. 4. Anemia of ESRD: Stable 5. Metabolic Bone Disease: Will check a phos with therapy to follow. 69. H/O CVA x2   Dialyzes at Home for 3 mths PD Primary Nephrologist Dr. Moshe Cipro Access PD + lt Cimino  HPI: Hayden [name] is a 46 y.o. male with a h/o CVA x2 when he presented with left sided numbness and weakness now with only changes to personality as residual effects. He's a ESRD pt followed by Dr. Moshe Cipro switched to PD 3 mths ago; he is CCPD with 5 exchanges w/ usually 2.5% 3L fills and the 5th fill he manually drains 1-1/2 hours later. He is presenting with a nonhealing wound on his right foot GT with erythema and black discolorations as well. Initially it was healing but has since stagnated with increase in pain as well which is why he came to the ED. He failed outpt Dalvance and Clindamycin. He denies any fever, chills, nausea, diarrhea, abdominal pain. He also denies any dizziness, syncopal episodes, CP or dysnpea.    Past Medical History:  Diagnosis Date  . Anemia March 2014  . Cancer ([place])    Kidney  . CHF (congestive heart failure)  ([place]) 9449   Acute systolic and diastolic CHF  . Depression    & rage --  was in counseling....great now  . Diabetes mellitus    NO DM SINCE LOST 130LBS  . History of kidney cancer   . Hyperlipidemia   . Hypertension   . Obesity   . PONV (postoperative nausea and vomiting)   . Renal disorder   . Stroke [facility])     Medications: I have reviewed the patient's current medications.   Allergies:  Allergies  Allergen Reactions  . Dilaudid [Hydromorphone Hcl] Other (See Comments)    Abnormal behavior, "verbal and physically abusive", "psychosis"  . Morphine And Related Itching    Syncopal episode    Past Surgical History:  Procedure Laterality Date  . AV FISTULA PLACEMENT Left 01/26/2013   Procedure: ARTERIOVENOUS (AV) FISTULA CREATION - LEFT RADIAL CEPHALIC AVF;  Surgeon: Angelia Mould, MD;  Location: [facility];  Service: Vascular;  Laterality: Left;  . CHOLECYSTECTOMY  11/06/2015   Procedure: LAPAROSCOPIC CHOLECYSTECTOMY;  Surgeon: Ralene Ok, MD;  Location: [facility];  Service: General;;  . HERNIA REPAIR    . NEPHRECTOMY Right 2008   partial  . TESTICLE TORSION REDUCTION    . TONSILLECTOMY AND ADENOIDECTOMY      Family History  Problem Relation Age of Onset  . Heart disease Mother     Heart Disease before age 45  . Deep vein thrombosis Father   . Heart attack Father   . Other  Other     Social History:  reports that he has been smoking Cigarettes.  He has a 2.50 pack-year smoking history. He has never used smokeless tobacco. He reports that he uses drugs, including Marijuana. He reports that he does not drink alcohol.  ROS Pertinent items are noted in HPI.  Blood pressure (!) 208/109, pulse 98, temperature 98.9 F (37.2 C), temperature source Oral, resp. rate 18, height 5' 10"  (1.778 m), weight 97 kg (213 lb 13.5 oz), SpO2 91 %. General appearance: alert, cooperative and appears stated age Head: Normocephalic, without obvious abnormality, atraumatic Eyes:  negative Throat: lips, mucosa, and tongue normal; teeth and gums normal Neck: no adenopathy, no carotid bruit, no JVD, supple, symmetrical, trachea midline and thyroid not enlarged, symmetric, no tenderness/mass/nodules Back: symmetric, no curvature. ROM normal. No CVA tenderness. Resp: clear to auscultation bilaterally Chest wall: no tenderness Cardio: HSB LSB GI: soft, non-tender; bowel sounds normal; no masses,  no organomegaly Extremities: edema trace, right GT black discoloration + erythema at the base and medial aspect Pulses: Right Pulses: FEM: present 2+, POP: present 2+, DP: present 2+, PT: present 2+ Skin: See ext for the right GT on foot Lymph nodes: Cervical, supraclavicular, and axillary nodes normal. Neurologic: Grossly normal Incision/Wound:  Results for orders placed or performed during the hospital encounter of 10/23/16 (from the past 48 hour(s))  CBC with Differential     Status: Abnormal   Collection Time: 10/23/16  5:20 PM  Result Value Ref Range   WBC 14.6 (H) 4.0 - 10.5 K/uL   RBC 4.56 4.22 - 5.81 MIL/uL   Hemoglobin 11.3 (L) 13.0 - 17.0 g/dL   HCT 35.3 (L) 39.0 - 52.0 %   MCV 77.4 (L) 78.0 - 100.0 fL   MCH 24.8 (L) 26.0 - 34.0 pg   MCHC 32.0 30.0 - 36.0 g/dL   RDW 18.7 (H) 11.5 - 15.5 %   Platelets 427 (H) 150 - 400 K/uL   Neutrophils Relative % 84 %   Neutro Abs 12.3 (H) 1.7 - 7.7 K/uL   Lymphocytes Relative 5 %   Lymphs Abs 0.8 0.7 - 4.0 K/uL   Monocytes Relative 6 %   Monocytes Absolute 0.9 0.1 - 1.0 K/uL   Eosinophils Relative 4 %   Eosinophils Absolute 0.5 0.0 - 0.7 K/uL   Basophils Relative 1 %   Basophils Absolute 0.2 (H) 0.0 - 0.1 K/uL  Comprehensive metabolic panel     Status: Abnormal   Collection Time: 10/23/16  5:20 PM  Result Value Ref Range   Sodium 137 135 - 145 mmol/L   Potassium 5.4 (H) 3.5 - 5.1 mmol/L   Chloride 98 (L) 101 - 111 mmol/L   CO2 21 (L) 22 - 32 mmol/L   Glucose, Bld 106 (H) 65 - 99 mg/dL   BUN 102 (H) 6 - 20 mg/dL    Creatinine, Ser 13.79 (H) 0.61 - 1.24 mg/dL   Calcium 7.7 (L) 8.9 - 10.3 mg/dL   Total Protein 6.4 (L) 6.5 - 8.1 g/dL   Albumin 2.6 (L) 3.5 - 5.0 g/dL   AST 28 15 - 41 U/L   ALT 49 17 - 63 U/L   Alkaline Phosphatase 264 (H) 38 - 126 U/L   Total Bilirubin 1.2 0.3 - 1.2 mg/dL   GFR calc non Af Amer 4 (L) >60 mL/min   GFR calc Af Amer 4 (L) >60 mL/min    Comment: (NOTE) The eGFR has been calculated using the CKD EPI equation.  This calculation has not been validated in all clinical situations. eGFR's persistently <60 mL/min signify possible Chronic Kidney Disease.    Anion gap 18 (H) 5 - 15  I-Stat CG4 Lactic Acid, ED     Status: None   Collection Time: 10/23/16  5:26 PM  Result Value Ref Range   Lactic Acid, Venous 0.73 0.5 - 1.9 mmol/L    Dg Foot Complete Right  Result Date: 10/23/2016 CLINICAL DATA:  Recent foot injury 2 weeks ago with persistent pain and discoloration EXAM: RIGHT FOOT COMPLETE - 3+ VIEW COMPARISON:  None. FINDINGS: No acute fracture or dislocation is noted. Diffuse vascular calcifications are seen. IMPRESSION: No acute abnormality noted. Electronically Signed   By: Inez Catalina M.D.   On: 10/23/2016 17:50     Dwana Melena, MD 10/23/2016, 8:22 PM

## 2016-10-23 NOTE — ED Notes (Signed)
PA at bedside.

## 2016-10-23 NOTE — ED Notes (Signed)
Admitting Provider at bedside. 

## 2016-10-23 NOTE — ED Notes (Signed)
Call placed to main pharmacy for medication verification of clonidine due to pt BP

## 2016-10-23 NOTE — ED Triage Notes (Signed)
PT has wound to right great toe, top of foot, and bottom of foot. Wound bed on right great toe appears black and shiny. PT states the would had been there for about 2 weeks after injuring it on a car seat. Pt denies drainage from wound or fevers at home

## 2016-10-23 NOTE — ED Notes (Signed)
Spoke with PA about pain meds for patient

## 2016-10-23 NOTE — ED Provider Notes (Signed)
[place] DEPT Provider Note   CSN: [id number] Arrival date & time: 10/23/16  1658     History   Chief Complaint Chief Complaint  Patient presents with  . Foot Injury    HPI Hayden [name] is a 46 y.o. male with history of CKD on peritoneal dialysis who presents with injury to right toe that occurred 2 weeks ago. Patient states he got his toe caught under a car seat. There is minimal bleeding at first. He states the wound looked like a blood blister the second day. Patient was evaluated [facility] department on 4/11, who recommended admission at the time, but patient left AGAINST MEDICAL ADVICE and followed up with his PCP (nephrologist). Patient was treated with Dalvance, a dose of vancomycin (in the [facility] ED), as well as clindamycin outpatient. There has been improvement of redness, which was initially moving of the patient's leg, however the patient continues to have redness and pain at rest and worsening on ambulation. Patient states he has had trouble walking on the foot over the past few days. Patient denies any fevers, chest pain, shortness of breath, abdominal pain, nausea, vomiting. Patient has appointment set up with the wound clinic for next week.  HPI  Past Medical History:  Diagnosis Date  . Anemia March 2014  . Cancer ([place])    Kidney  . CHF (congestive heart failure) ([place]) 0938   Acute systolic and diastolic CHF  . Depression    & rage --  was in counseling....great now  . Diabetes mellitus    NO DM SINCE LOST 130LBS  . History of kidney cancer   . Hyperlipidemia   . Hypertension   . Obesity   . PONV (postoperative nausea and vomiting)   . Renal disorder   . Stroke [facility])     Patient Active Problem List   Diagnosis Date Noted  . Abdominal hematoma 11/15/2015  . HCAP (healthcare-associated pneumonia) 11/15/2015  . Acute blood loss anemia 11/15/2015  . Absolute anemia   . Right lower quadrant pain   . Pneumonia involving right  lung 11/04/2015  . HTN (hypertension) 11/04/2015  . Noninfectious gastroenteritis and colitis 11/04/2015  . Gastroenteritis 11/04/2015  . CVA (cerebral infarction) 06/19/2015  . End stage renal disease on dialysis ([place]) 06/19/2015  . Lacunar infarct, acute ([place]) 06/19/2015  . GERD (gastroesophageal reflux disease) 06/19/2015  . Anxiety and depression 06/19/2015  . Hypertension, well controlled 06/19/2015  . End stage renal disease ([place]) 01/21/2013  . Acute coronary syndrome ([place]) 09/22/2012  . History of partial Right nephrectomy for renal mass (2008) 09/22/2012  . Left ventricular hypertrophy (moderate) per ECHO 2010 09/22/2012  . Chronic systolic congestive heart failure/EF 40% PER echo 2010 09/22/2012  . 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 Acute kidney injury ([place]) 09/21/2012  . Chest pain 09/21/2012    Past Surgical History:  Procedure Laterality Date  . AV FISTULA PLACEMENT Left 01/26/2013   Procedure: ARTERIOVENOUS (AV) FISTULA CREATION - LEFT RADIAL CEPHALIC AVF;  Surgeon: Angelia Mould, MD;  Location: [place];  Service: Vascular;  Laterality: Left;  . CHOLECYSTECTOMY  11/06/2015   Procedure: LAPAROSCOPIC CHOLECYSTECTOMY;  Surgeon: Ralene Ok, MD;  Location: [place];  Service: General;;  . HERNIA REPAIR    . NEPHRECTOMY Right 2008   partial  . TESTICLE TORSION REDUCTION    . TONSILLECTOMY AND ADENOIDECTOMY         Home Medications    Prior to Admission medications   Medication Sig  Start Date End Date Taking? Authorizing Provider  ALPRAZolam Duanne Moron) 1 MG tablet Take 1 mg by mouth at bedtime as needed for anxiety.    Historical Provider, MD  amLODipine (NORVASC) 10 MG tablet Take 10 mg by mouth daily.    Historical Provider, MD  aspirin EC 81 MG tablet Take 81 mg by mouth daily.    Historical Provider, MD  buPROPion (WELLBUTRIN SR) 150 MG 12 hr tablet Take one tab by mouth every morning & two tabs every evening    Historical Provider, MD  calcitRIOL  (ROCALTROL) 0.5 MCG capsule Take 0.5 mcg by mouth daily.    Historical Provider, MD  calcium acetate (PHOSLO) 667 MG capsule Take 2 capsules (1,334 mg total) by mouth 3 (three) times daily with meals. 11/20/15   Modena Jansky, MD  Cholecalciferol (VITAMIN D PO) Take 1 capsule by mouth daily.    Historical Provider, MD  cinacalcet (SENSIPAR) 30 MG tablet Take 60 mg by mouth daily.     Historical Provider, MD  cloNIDine (CATAPRES - DOSED IN MG/24 HR) 0.2 mg/24hr patch Place 1 patch onto the skin once a week.    Historical Provider, MD  escitalopram (LEXAPRO) 20 MG tablet Take 20 mg by mouth daily.    Historical Provider, MD  furosemide (LASIX) 80 MG tablet Take 80 mg by mouth 2 (two) times daily.    Historical Provider, MD  hydrALAZINE (APRESOLINE) 100 MG tablet Take 100 mg by mouth 2 (two) times daily.    Historical Provider, MD  labetalol (NORMODYNE) 100 MG tablet Take 150 mg by mouth 2 (two) times daily.     Historical Provider, MD  omeprazole (PRILOSEC) 40 MG capsule Take 40 mg by mouth daily.    Historical Provider, MD  PARoxetine (PAXIL) 20 MG tablet Take 20 mg by mouth.    Historical Provider, MD  pravastatin (PRAVACHOL) 20 MG tablet Take 20 mg by mouth daily.    Historical Provider, MD  saccharomyces boulardii (FLORASTOR) 250 MG capsule Take 1 capsule (250 mg total) by mouth 2 (two) times daily. 11/09/15   Albertine Patricia, MD    Family History Family History  Problem Relation Age of Onset  . Heart disease Mother     Heart Disease before age 46  . Deep vein thrombosis Father   . Heart attack Father   . Other Other     Social History Social History  Substance Use Topics  . Smoking status: Current Every Day Smoker    Packs/day: 0.25    Years: 10.00    Types: Cigarettes  . Smokeless tobacco: Never Used  . Alcohol use No     Comment: "6 pack occ."-- lasts him six months-STOPPED 2 YRS AGO     Allergies   Dilaudid [hydromorphone hcl] and Morphine and related   Review of  Systems Review of Systems  Constitutional: Negative for chills and fever.  HENT: Negative for facial swelling and sore throat.   Respiratory: Negative for shortness of breath.   Cardiovascular: Negative for chest pain.  Gastrointestinal: Negative for abdominal pain, nausea and vomiting.  Genitourinary: Negative for dysuria.  Musculoskeletal: Negative for back pain.  Skin: Positive for color change and wound. Negative for rash.  Neurological: Negative for headaches.  Psychiatric/Behavioral: The patient is not nervous/anxious.      Physical Exam Updated Vital Signs BP (!) 232/116   Pulse 94   Temp 98.9 F (37.2 C) (Oral)   Resp 18   Ht 5\' 10"  (1.778  m)   Wt 97 kg   SpO2 91%   BMI 30.68 kg/m   Physical Exam  Constitutional: He appears well-developed and well-nourished. No distress.  HENT:  Head: Normocephalic and atraumatic.  Mouth/Throat: Oropharynx is clear and moist. No oropharyngeal exudate.  Eyes: Conjunctivae are normal. Pupils are equal, round, and reactive to light. Right eye exhibits no discharge. Left eye exhibits no discharge. No scleral icterus.  Neck: Normal range of motion. Neck supple. No thyromegaly present.  Cardiovascular: Normal rate, regular rhythm, normal heart sounds and intact distal pulses.  Exam reveals no gallop and no friction rub.   No murmur heard. Pulmonary/Chest: Effort normal and breath sounds normal. No stridor. No respiratory distress. He has no wheezes. He has no rales.  Abdominal: Soft. Bowel sounds are normal. He exhibits no distension. There is no tenderness. There is no rebound and no guarding.  Musculoskeletal: He exhibits no edema.  Tenderness to R great toe and plantar aspect of R foot  Lymphadenopathy:    He has no cervical adenopathy.  Neurological: He is alert. Coordination normal.  Skin: Skin is warm and dry. No rash noted. He is not diaphoretic. No pallor.  R foot: Wound with surrounding erythema, warmth, and tenderness to great  toe and plantar aspect (see image)  Psychiatric: He has a normal mood and affect.  Nursing note and vitals reviewed.        ED Treatments / Results  Labs (all labs ordered are listed, but only abnormal results are displayed) Labs Reviewed  CBC WITH DIFFERENTIAL/PLATELET - Abnormal; Notable for the following:       Result Value   WBC 14.6 (*)    Hemoglobin 11.3 (*)    HCT 35.3 (*)    MCV 77.4 (*)    MCH 24.8 (*)    RDW 18.7 (*)    Platelets 427 (*)    Neutro Abs 12.3 (*)    Basophils Absolute 0.2 (*)    All other components within normal limits  COMPREHENSIVE METABOLIC PANEL - Abnormal; Notable for the following:    Potassium 5.4 (*)    Chloride 98 (*)    CO2 21 (*)    Glucose, Bld 106 (*)    BUN 102 (*)    Creatinine, Ser 13.79 (*)    Calcium 7.7 (*)    Total Protein 6.4 (*)    Albumin 2.6 (*)    Alkaline Phosphatase 264 (*)    GFR calc non Af Amer 4 (*)    GFR calc Af Amer 4 (*)    Anion gap 18 (*)    All other components within normal limits  CULTURE, BLOOD (ROUTINE X 2)  CULTURE, BLOOD (ROUTINE X 2)  I-STAT CG4 LACTIC ACID, ED  I-STAT CG4 LACTIC ACID, ED    EKG  EKG Interpretation None       Radiology Dg Foot Complete Right  Result Date: 10/23/2016 CLINICAL DATA:  Recent foot injury 2 weeks ago with persistent pain and discoloration EXAM: RIGHT FOOT COMPLETE - 3+ VIEW COMPARISON:  None. FINDINGS: No acute fracture or dislocation is noted. Diffuse vascular calcifications are seen. IMPRESSION: No acute abnormality noted. Electronically Signed   By: Inez Catalina M.D.   On: 10/23/2016 17:50    Procedures Procedures (including critical care time)  Medications Ordered in ED Medications  labetalol (NORMODYNE) tablet 150 mg (not administered)  vancomycin (VANCOCIN) 2,000 mg in sodium chloride 0.9 % 500 mL IVPB (2,000 mg Intravenous New Bag/Given 10/23/16 1931)  piperacillin-tazobactam (ZOSYN) IVPB 3.375 g (not administered)  hydrALAZINE (APRESOLINE)  injection 10 mg (10 mg Intravenous Given 10/23/16 1936)  piperacillin-tazobactam (ZOSYN) IVPB 3.375 g (0 g Intravenous Stopped 10/23/16 1928)  hydrALAZINE (APRESOLINE) tablet 100 mg (100 mg Oral Given 10/23/16 1857)  fentaNYL (SUBLIMAZE) injection 50 mcg (50 mcg Intravenous Given 10/23/16 1931)     Initial Impression / Assessment and Plan / ED Course  I have reviewed the triage vital signs and the nursing notes.  Pertinent labs & imaging results that were available during my care of the patient were reviewed by me and considered in my medical decision making (see chart for details).     Patient with cellulitis to right foot. CBC shows WBC 14.6, hemoglobin 11.3. CMP is within normal limits for the patient. Patient has not had a PD yet today. X-ray of right foot is normal. Initial lactate 0.73. Blood cultures sent. Antibiotics initiated in the ED (vancomycin and Zosyn). Patient to be admitted for cellulitis with failed outpatient management. I consulted [facility], Dr. Tamala Julian, who will admit the patient for further evaluation and treatment. I consulted Dr. Augustin Coupe, nephrologist, who will arrange peritoneal dialysis. I discussed patient case with Dr. Laneta Simmers who guided the patient's management and agrees with plan.   Final Clinical Impressions(s) / ED Diagnoses   Final diagnoses:  Cellulitis of right lower extremity    New Prescriptions New Prescriptions   No medications on file     Caryl Ada 10/23/16 1958    Leo Grosser, MD 10/24/16 1249

## 2016-10-23 NOTE — Progress Notes (Signed)
Pharmacy Antibiotic Note Hayden [name] is a 46 y.o. male admitted on 10/23/2016 with right foot wound/cellulitis that failed to respond to out dose of Dalvance 1500mg  IV on 10/10/16 and PO clindamycin. History of ESRD on PD at home. Pharmacy consulted to dose Zosyn and vancomycin.    Plan: 1. Zosyn 3.375 grams IV every 12 hours (infused over 4 hours) 2. Vancomycin 2000 mg x 1; will obtain level in 3-4 days and redose when vancomycin level < 20   Height: 5\' 10"  (177.8 cm) Weight: 213 lb 13.5 oz (97 kg) IBW/kg (Calculated) : 73  Temp (24hrs), Avg:98.9 F (37.2 C), Min:98.9 F (37.2 C), Max:98.9 F (37.2 C)   Recent Labs Lab 10/23/16 1720 10/23/16 1726  WBC 14.6*  --   CREATININE 13.79*  --   LATICACIDVEN  --  0.73    Estimated Creatinine Clearance: 8 mL/min (A) (by C-G formula based on SCr of 13.79 mg/dL (H)).    Allergies  Allergen Reactions  . Dilaudid [Hydromorphone Hcl] Other (See Comments)    Abnormal behavior, "verbal and physically abusive", "psychosis"  . Morphine And Related Itching    Syncopal episode    Antimicrobials this admission: 4/25 Zosyn >>  4/25 vancomycin >>    Microbiology results: 4825 BCx: px  Thank you for allowing pharmacy to be a part of this patient's care.  Vincenza Hews, PharmD, BCPS 10/23/2016, 7:29 PM

## 2016-10-23 NOTE — H&P (Addendum)
History and Physical    Hayden [name] GDJ:[id number] DOB: 18-Dec-1970 DOA: 10/23/2016  Referring MD/NP/PA: Mal Amabile, PA-C PCP: Louis Meckel, MD  Patient coming from: home  Chief Complaint: Right foot redness and pain  HPI: Hayden [name] is a 46 y.o. 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 The initial injury happened over one month ago when his foot caught in the track of the back seat of his sisters mini [name]. He initially tried to keep the area clean and washed. However, develop redness, swelling and pain. Approximately 2 weeks ago he was seen at the emergency department and was given 1 dose of vancomycin, but ended up leaving AMA. Patient also appears to have gotten 1 dose of Dalvavancin during dialysis. The following day he was seen by his PCP who prescribed the patient clindamycin. He has been putting triple antibiotic ointment on the foot and taking clindamycin as prescribed for the last 12-13 days. Despite these efforts patient reports continued pain that he describes feels like a constant toothache. Rates pain a 6-7/10 at baseline and worsens with any weightbearing on the affected extremity. Denies any significant drainage from the wound. He has not had any fever, chills nausea, vomiting, diarrhea, constipation, chest pain, shortness of breath cough, or worsening swelling. Symptoms have been keeping him up at night. Patient has been being set up to be seen by wound care, but that appointment is not until sometime next week. He reports that he did not take any of his blood pressure medications today and normally does peritoneal dialysis at night.  ED Course: Admission into the emergency department patient was seen to be afebrile, blood pressures up to 240/110. Labs revealed WBC 14.6, but reassuring lactic acid at 0.73. X-rays of the foot show no acute abnormalities.  Patient was started on empiric antibiotics of vancomycin and Zosyn while in the ED. Nephrology was called as patient needs peritoneal dialysis and his missed  home blood pressure medications were given.  Review of Systems: As per HPI otherwise 10 point review of systems negative.   Past Medical History:  Diagnosis Date  . Anemia March 2014  . Cancer ([place])    Kidney  . CHF (congestive heart failure) ([place]) 6222   Acute systolic and diastolic CHF  . Depression    & rage --  was in counseling....great now  . Diabetes mellitus    NO DM SINCE LOST 130LBS  . History of kidney cancer   . Hyperlipidemia   . Hypertension   . Obesity   . PONV (postoperative nausea and vomiting)   . Renal disorder   . Stroke [facility])     Past Surgical History:  Procedure Laterality Date  . AV FISTULA PLACEMENT Left 01/26/2013   Procedure: ARTERIOVENOUS (AV) FISTULA CREATION - LEFT RADIAL CEPHALIC AVF;  Surgeon: Angelia Mould, MD;  Location: [place];  Service: Vascular;  Laterality: Left;  . CHOLECYSTECTOMY  11/06/2015   Procedure: LAPAROSCOPIC CHOLECYSTECTOMY;  Surgeon: Ralene Ok, MD;  Location: [place];  Service: General;;  . HERNIA REPAIR    . NEPHRECTOMY Right 2008   partial  . TESTICLE TORSION REDUCTION    . TONSILLECTOMY AND ADENOIDECTOMY       reports that he has been smoking Cigarettes.  He has a 2.50 pack-year smoking history. He has never used smokeless tobacco. He reports that he uses drugs, including Marijuana. He reports that he  does not drink alcohol.  Allergies  Allergen Reactions  . Dilaudid [Hydromorphone Hcl] Other (See Comments)    Abnormal behavior, "verbal and physically abusive", "psychosis"  . Morphine And Related Itching    Syncopal episode    Family History  Problem Relation Age of Onset  . Heart disease Mother     Heart Disease before age 39  . Deep vein thrombosis Father   . Heart attack Father   . Other Other     Prior to Admission medications   Medication  Sig Start Date End Date Taking? Authorizing Provider  ALPRAZolam Duanne Moron) 1 MG tablet Take 1 mg by mouth at bedtime as needed for anxiety.    Historical Provider, MD  amLODipine (NORVASC) 10 MG tablet Take 10 mg by mouth daily.    Historical Provider, MD  aspirin EC 81 MG tablet Take 81 mg by mouth daily.    Historical Provider, MD  buPROPion (WELLBUTRIN SR) 150 MG 12 hr tablet Take one tab by mouth every morning & two tabs every evening    Historical Provider, MD  calcitRIOL (ROCALTROL) 0.5 MCG capsule Take 0.5 mcg by mouth daily.    Historical Provider, MD  calcium acetate (PHOSLO) 667 MG capsule Take 2 capsules (1,334 mg total) by mouth 3 (three) times daily with meals. 11/20/15   Modena Jansky, MD  Cholecalciferol (VITAMIN D PO) Take 1 capsule by mouth daily.    Historical Provider, MD  cinacalcet (SENSIPAR) 30 MG tablet Take 60 mg by mouth daily.     Historical Provider, MD  cloNIDine (CATAPRES - DOSED IN MG/24 HR) 0.2 mg/24hr patch Place 1 patch onto the skin once a week.    Historical Provider, MD  escitalopram (LEXAPRO) 20 MG tablet Take 20 mg by mouth daily.    Historical Provider, MD  furosemide (LASIX) 80 MG tablet Take 80 mg by mouth 2 (two) times daily.    Historical Provider, MD  hydrALAZINE (APRESOLINE) 100 MG tablet Take 100 mg by mouth 2 (two) times daily.    Historical Provider, MD  labetalol (NORMODYNE) 100 MG tablet Take 150 mg by mouth 2 (two) times daily.     Historical Provider, MD  omeprazole (PRILOSEC) 40 MG capsule Take 40 mg by mouth daily.    Historical Provider, MD  PARoxetine (PAXIL) 20 MG tablet Take 20 mg by mouth.    Historical Provider, MD  pravastatin (PRAVACHOL) 20 MG tablet Take 20 mg by mouth daily.    Historical Provider, MD  saccharomyces boulardii (FLORASTOR) 250 MG capsule Take 1 capsule (250 mg total) by mouth 2 (two) times daily. 11/09/15   Albertine Patricia, MD    Physical Exam:  Constitutional: NAD, calm, comfortable Vitals:   10/23/16 1711  10/23/16 1714 10/23/16 1800 10/23/16 1830  BP: (!) 240/110  (!) 223/118 (!) 229/122  Pulse: 98  95 94  Resp: 18     Temp: 98.9 F (37.2 C)     TempSrc: Oral     SpO2: 98%  94% 91%  Weight:  97 kg (213 lb 13.5 oz)    Height:  5\' 10"  (1.778 m)     Eyes: PERRL, lids and conjunctivae normal ENMT: Mucous membranes are moist. Posterior pharynx clear of any exudate or lesions.Normal dentition.  Neck: normal, supple, no masses, no thyromegaly Respiratory: clear to auscultation bilaterally, no wheezing, no crackles. Normal respiratory effort. No accessory muscle use.  Cardiovascular: Regular rate and rhythm, no murmurs / rubs / gallops. No extremity edema.  2+ pedal pulses. No carotid bruits. Abdomen: no tenderness, no masses palpated. No hepatosplenomegaly. Bowel sounds positive.  Musculoskeletal: no clubbing / cyanosis. No joint deformity upper and lower extremities. Good ROM, no contractures. Normal muscle tone.  Skin: Wound (as seen below)with ssurrounding erythema of the right big toe. No drainage appreciated.    Neurologic: CN 2-12 grossly intact. Sensation intact, DTR normal. Strength 5/5 in all 4.  Psychiatric: Normal judgment and insight. Alert and oriented x 3. Normal mood.     Labs on Admission: I have personally reviewed following labs and imaging studies  CBC:  Recent Labs Lab 10/23/16 1720  WBC 14.6*  NEUTROABS 12.3*  HGB 11.3*  HCT 35.3*  MCV 77.4*  PLT 573*   Basic Metabolic Panel:  Recent Labs Lab 10/23/16 1720  NA 137  K 5.4*  CL 98*  CO2 21*  GLUCOSE 106*  BUN 102*  CREATININE 13.79*  CALCIUM 7.7*   GFR: Estimated Creatinine Clearance: 8 mL/min (A) (by C-G formula based on SCr of 13.79 mg/dL (H)). Liver Function Tests:  Recent Labs Lab 10/23/16 1720  AST 28  ALT 49  ALKPHOS 264*  BILITOT 1.2  PROT 6.4*  ALBUMIN 2.6*   No results for input(s): LIPASE, AMYLASE in the last 168 hours. No results for input(s): AMMONIA in the last 168  hours. Coagulation Profile: No results for input(s): INR, PROTIME in the last 168 hours. Cardiac Enzymes: No results for input(s): CKTOTAL, CKMB, CKMBINDEX, TROPONINI in the last 168 hours. BNP (last 3 results) No results for input(s): PROBNP in the last 8760 hours. HbA1C: No results for input(s): HGBA1C in the last 72 hours. CBG: No results for input(s): GLUCAP in the last 168 hours. Lipid Profile: No results for input(s): CHOL, HDL, LDLCALC, TRIG, CHOLHDL, LDLDIRECT in the last 72 hours. Thyroid Function Tests: No results for input(s): TSH, T4TOTAL, FREET4, T3FREE, THYROIDAB in the last 72 hours. Anemia Panel: No results for input(s): VITAMINB12, FOLATE, FERRITIN, TIBC, IRON, RETICCTPCT in the last 72 hours. Urine analysis:    Component Value Date/Time   COLORURINE AMBER (A) 11/04/2015 1930   APPEARANCEUR TURBID (A) 11/04/2015 1930   LABSPEC 1.019 11/04/2015 1930   PHURINE 5.5 11/04/2015 1930   GLUCOSEU 100 (A) 11/04/2015 1930   HGBUR NEGATIVE 11/04/2015 1930   BILIRUBINUR NEGATIVE 11/04/2015 1930   KETONESUR NEGATIVE 11/04/2015 1930   PROTEINUR >300 (A) 11/04/2015 1930   UROBILINOGEN 0.2 09/22/2012 1805   NITRITE NEGATIVE 11/04/2015 1930   LEUKOCYTESUR NEGATIVE 11/04/2015 1930   Sepsis Labs: No results found for this or any previous visit (from the past 240 hour(s)).   Radiological Exams on Admission: Dg Foot Complete Right  Result Date: 10/23/2016 CLINICAL DATA:  Recent foot injury 2 weeks ago with persistent pain and discoloration EXAM: RIGHT FOOT COMPLETE - 3+ VIEW COMPARISON:  None. FINDINGS: No acute fracture or dislocation is noted. Diffuse vascular calcifications are seen. IMPRESSION: No acute abnormality noted. Electronically Signed   By: Inez Catalina M.D.   On: 10/23/2016 17:50    UKG:[id number]  Assessment/Plan Cellulitis of the right foot: Acute on chronic. Patient has been dealing with symptoms for at least one month.  Suspected failed treatment with  outpatient antibiotics of clindamycin.- Admit to a telemetry bed - Cellulitis order set initiated - Follow up blood cultures(note previous blood cultures obtained at [facility] 2 weeks ago) - Continue empiric antibiotics of vancomycin and [place] - Wound care consult  Hypertensive urgency: Blood pressures noted to be as  high as 240/110 on admission. Patient Notes not recently taking home blood pressure medications and therefore these were given while in the ED - Continue clonidine patch, hydralazine, labetalol  - Hydralazine IV prn sBP > 180   Leukocytosis: WBC elevated at 14.6. Suspect secondary to above. - Repeat CBC in a.m.  ESRD on peritoneal dialysis: Patient receives nightly peritoneal pulses dialysis. Initial BUN 102, creatinine 13.79. - Nephrology consulted by PA-C for peritoneal dialysis - Continue Sensipar, calcitriol, and PhosLo  Hyperkalemia: Acute. Initial potassium 5.4 on admission. - Check EKG - Check repeat BMP in a.m.  Elevated anion gap likely secondary to uremia. Suspect will improve following dialysis.  Elevated alkaline phosphatase - Follow-up repeat CMP   Anxiety and depression - Continue  Xanax q HS prn anxiety   DVT prophylaxis: heparin  Code Status: Full Family Communication: Discussed plan of care with the patient and family present at bedside  Disposition Plan: Home in 1-2 days  Consults called: Nephrology  Admission status: Inpatient  Norval Morton MD [name] Pager [phone]  If 7PM-7AM, please contact night-coverage [email or web address] Password [facility]  10/23/2016, 7:28 PM

## 2016-10-23 NOTE — ED Notes (Signed)
Patient transported to X-ray 

## 2016-10-24 DIAGNOSIS — Z8249 Family history of ischemic heart disease and other diseases of the circulatory system: Secondary | ICD-10-CM | POA: Diagnosis not present

## 2016-10-24 DIAGNOSIS — F329 Major depressive disorder, single episode, unspecified: Secondary | ICD-10-CM | POA: Diagnosis present

## 2016-10-24 DIAGNOSIS — Z01810 Encounter for preprocedural cardiovascular examination: Secondary | ICD-10-CM | POA: Diagnosis not present

## 2016-10-24 DIAGNOSIS — E875 Hyperkalemia: Secondary | ICD-10-CM | POA: Diagnosis present

## 2016-10-24 DIAGNOSIS — I160 Hypertensive urgency: Secondary | ICD-10-CM | POA: Diagnosis not present

## 2016-10-24 DIAGNOSIS — G9340 Encephalopathy, unspecified: Secondary | ICD-10-CM | POA: Diagnosis present

## 2016-10-24 DIAGNOSIS — I70269 Atherosclerosis of native arteries of extremities with gangrene, unspecified extremity: Secondary | ICD-10-CM | POA: Diagnosis not present

## 2016-10-24 DIAGNOSIS — R451 Restlessness and agitation: Secondary | ICD-10-CM | POA: Diagnosis not present

## 2016-10-24 DIAGNOSIS — D649 Anemia, unspecified: Secondary | ICD-10-CM | POA: Diagnosis present

## 2016-10-24 DIAGNOSIS — R9431 Abnormal electrocardiogram [ECG] [EKG]: Secondary | ICD-10-CM | POA: Diagnosis not present

## 2016-10-24 DIAGNOSIS — L03115 Cellulitis of right lower limb: Secondary | ICD-10-CM | POA: Diagnosis present

## 2016-10-24 DIAGNOSIS — I96 Gangrene, not elsewhere classified: Secondary | ICD-10-CM | POA: Diagnosis not present

## 2016-10-24 DIAGNOSIS — R5381 Other malaise: Secondary | ICD-10-CM | POA: Diagnosis not present

## 2016-10-24 DIAGNOSIS — G8918 Other acute postprocedural pain: Secondary | ICD-10-CM | POA: Diagnosis not present

## 2016-10-24 DIAGNOSIS — E1122 Type 2 diabetes mellitus with diabetic chronic kidney disease: Secondary | ICD-10-CM | POA: Diagnosis present

## 2016-10-24 DIAGNOSIS — J9601 Acute respiratory failure with hypoxia: Secondary | ICD-10-CM | POA: Diagnosis not present

## 2016-10-24 DIAGNOSIS — F17210 Nicotine dependence, cigarettes, uncomplicated: Secondary | ICD-10-CM | POA: Diagnosis present

## 2016-10-24 DIAGNOSIS — N2581 Secondary hyperparathyroidism of renal origin: Secondary | ICD-10-CM | POA: Diagnosis present

## 2016-10-24 DIAGNOSIS — Z992 Dependence on renal dialysis: Secondary | ICD-10-CM | POA: Diagnosis not present

## 2016-10-24 DIAGNOSIS — I272 Pulmonary hypertension, unspecified: Secondary | ICD-10-CM | POA: Diagnosis present

## 2016-10-24 DIAGNOSIS — I169 Hypertensive crisis, unspecified: Secondary | ICD-10-CM | POA: Diagnosis present

## 2016-10-24 DIAGNOSIS — I739 Peripheral vascular disease, unspecified: Secondary | ICD-10-CM | POA: Diagnosis not present

## 2016-10-24 DIAGNOSIS — I5042 Chronic combined systolic (congestive) and diastolic (congestive) heart failure: Secondary | ICD-10-CM | POA: Diagnosis present

## 2016-10-24 DIAGNOSIS — I150 Renovascular hypertension: Secondary | ICD-10-CM | POA: Diagnosis not present

## 2016-10-24 DIAGNOSIS — Z905 Acquired absence of kidney: Secondary | ICD-10-CM | POA: Diagnosis not present

## 2016-10-24 DIAGNOSIS — K5900 Constipation, unspecified: Secondary | ICD-10-CM | POA: Diagnosis not present

## 2016-10-24 DIAGNOSIS — Z720 Tobacco use: Secondary | ICD-10-CM | POA: Diagnosis not present

## 2016-10-24 DIAGNOSIS — Z9889 Other specified postprocedural states: Secondary | ICD-10-CM | POA: Diagnosis not present

## 2016-10-24 DIAGNOSIS — Z4781 Encounter for orthopedic aftercare following surgical amputation: Secondary | ICD-10-CM | POA: Diagnosis present

## 2016-10-24 DIAGNOSIS — D72829 Elevated white blood cell count, unspecified: Secondary | ICD-10-CM | POA: Diagnosis present

## 2016-10-24 DIAGNOSIS — E1152 Type 2 diabetes mellitus with diabetic peripheral angiopathy with gangrene: Secondary | ICD-10-CM | POA: Diagnosis present

## 2016-10-24 DIAGNOSIS — I360 Nonrheumatic tricuspid (valve) stenosis: Secondary | ICD-10-CM | POA: Diagnosis not present

## 2016-10-24 DIAGNOSIS — F419 Anxiety disorder, unspecified: Secondary | ICD-10-CM | POA: Diagnosis present

## 2016-10-24 DIAGNOSIS — G9349 Other encephalopathy: Secondary | ICD-10-CM | POA: Diagnosis not present

## 2016-10-24 DIAGNOSIS — M79671 Pain in right foot: Secondary | ICD-10-CM | POA: Diagnosis present

## 2016-10-24 DIAGNOSIS — Z85528 Personal history of other malignant neoplasm of kidney: Secondary | ICD-10-CM | POA: Diagnosis not present

## 2016-10-24 DIAGNOSIS — N185 Chronic kidney disease, stage 5: Secondary | ICD-10-CM

## 2016-10-24 DIAGNOSIS — R079 Chest pain, unspecified: Secondary | ICD-10-CM | POA: Diagnosis not present

## 2016-10-24 DIAGNOSIS — R0989 Other specified symptoms and signs involving the circulatory and respiratory systems: Secondary | ICD-10-CM | POA: Diagnosis not present

## 2016-10-24 DIAGNOSIS — Z8673 Personal history of transient ischemic attack (TIA), and cerebral infarction without residual deficits: Secondary | ICD-10-CM | POA: Diagnosis not present

## 2016-10-24 DIAGNOSIS — F1290 Cannabis use, unspecified, uncomplicated: Secondary | ICD-10-CM | POA: Diagnosis not present

## 2016-10-24 DIAGNOSIS — E46 Unspecified protein-calorie malnutrition: Secondary | ICD-10-CM | POA: Diagnosis present

## 2016-10-24 DIAGNOSIS — D631 Anemia in chronic kidney disease: Secondary | ICD-10-CM | POA: Diagnosis present

## 2016-10-24 DIAGNOSIS — Z95828 Presence of other vascular implants and grafts: Secondary | ICD-10-CM | POA: Diagnosis not present

## 2016-10-24 DIAGNOSIS — D638 Anemia in other chronic diseases classified elsewhere: Secondary | ICD-10-CM | POA: Diagnosis not present

## 2016-10-24 DIAGNOSIS — F29 Unspecified psychosis not due to a substance or known physiological condition: Secondary | ICD-10-CM | POA: Diagnosis present

## 2016-10-24 DIAGNOSIS — E876 Hypokalemia: Secondary | ICD-10-CM | POA: Diagnosis present

## 2016-10-24 DIAGNOSIS — Z885 Allergy status to narcotic agent status: Secondary | ICD-10-CM | POA: Diagnosis not present

## 2016-10-24 DIAGNOSIS — N186 End stage renal disease: Secondary | ICD-10-CM | POA: Diagnosis present

## 2016-10-24 DIAGNOSIS — I10 Essential (primary) hypertension: Secondary | ICD-10-CM | POA: Diagnosis not present

## 2016-10-24 DIAGNOSIS — M792 Neuralgia and neuritis, unspecified: Secondary | ICD-10-CM | POA: Diagnosis not present

## 2016-10-24 DIAGNOSIS — L03031 Cellulitis of right toe: Secondary | ICD-10-CM | POA: Diagnosis present

## 2016-10-24 DIAGNOSIS — Z89411 Acquired absence of right great toe: Secondary | ICD-10-CM | POA: Diagnosis not present

## 2016-10-24 DIAGNOSIS — I43 Cardiomyopathy in diseases classified elsewhere: Secondary | ICD-10-CM | POA: Diagnosis present

## 2016-10-24 DIAGNOSIS — R410 Disorientation, unspecified: Secondary | ICD-10-CM | POA: Diagnosis not present

## 2016-10-24 DIAGNOSIS — D62 Acute posthemorrhagic anemia: Secondary | ICD-10-CM | POA: Diagnosis not present

## 2016-10-24 DIAGNOSIS — I132 Hypertensive heart and chronic kidney disease with heart failure and with stage 5 chronic kidney disease, or end stage renal disease: Secondary | ICD-10-CM | POA: Diagnosis present

## 2016-10-24 DIAGNOSIS — K219 Gastro-esophageal reflux disease without esophagitis: Secondary | ICD-10-CM | POA: Diagnosis present

## 2016-10-24 DIAGNOSIS — Z9049 Acquired absence of other specified parts of digestive tract: Secondary | ICD-10-CM | POA: Diagnosis not present

## 2016-10-24 DIAGNOSIS — E785 Hyperlipidemia, unspecified: Secondary | ICD-10-CM | POA: Diagnosis present

## 2016-10-24 DIAGNOSIS — Z6826 Body mass index (BMI) 26.0-26.9, adult: Secondary | ICD-10-CM | POA: Diagnosis not present

## 2016-10-24 DIAGNOSIS — R4189 Other symptoms and signs involving cognitive functions and awareness: Secondary | ICD-10-CM | POA: Diagnosis present

## 2016-10-24 DIAGNOSIS — I7092 Chronic total occlusion of artery of the extremities: Secondary | ICD-10-CM | POA: Diagnosis present

## 2016-10-24 DIAGNOSIS — G9341 Metabolic encephalopathy: Secondary | ICD-10-CM | POA: Diagnosis present

## 2016-10-24 DIAGNOSIS — L8990 Pressure ulcer of unspecified site, unspecified stage: Secondary | ICD-10-CM | POA: Insufficient documentation

## 2016-10-24 DIAGNOSIS — F23 Brief psychotic disorder: Secondary | ICD-10-CM | POA: Diagnosis not present

## 2016-10-24 LAB — COMPREHENSIVE METABOLIC PANEL
ALK PHOS: 243 U/L — AB (ref 38–126)
ALT: 43 U/L (ref 17–63)
AST: 22 U/L (ref 15–41)
Albumin: 2.5 g/dL — ABNORMAL LOW (ref 3.5–5.0)
Anion gap: 19 — ABNORMAL HIGH (ref 5–15)
BUN: 96 mg/dL — AB (ref 6–20)
CALCIUM: 7.9 mg/dL — AB (ref 8.9–10.3)
CO2: 21 mmol/L — AB (ref 22–32)
CREATININE: 13.22 mg/dL — AB (ref 0.61–1.24)
Chloride: 98 mmol/L — ABNORMAL LOW (ref 101–111)
GFR, EST AFRICAN AMERICAN: 5 mL/min — AB (ref >60)
GFR, EST NON AFRICAN AMERICAN: 4 mL/min — AB (ref >60)
Glucose, Bld: 70 mg/dL (ref 65–99)
Potassium: 4.6 mmol/L (ref 3.5–5.1)
SODIUM: 138 mmol/L (ref 135–145)
Total Bilirubin: 1.2 mg/dL (ref 0.3–1.2)
Total Protein: 6 g/dL — ABNORMAL LOW (ref 6.5–8.1)

## 2016-10-24 LAB — CBC
HCT: 32.5 % — ABNORMAL LOW (ref 39.0–52.0)
HEMOGLOBIN: 10.4 g/dL — AB (ref 13.0–17.0)
MCH: 25 pg — AB (ref 26.0–34.0)
MCHC: 32 g/dL (ref 30.0–36.0)
MCV: 78.1 fL (ref 78.0–100.0)
Platelets: 435 10*3/uL — ABNORMAL HIGH (ref 150–400)
RBC: 4.16 MIL/uL — AB (ref 4.22–5.81)
RDW: 19.2 % — ABNORMAL HIGH (ref 11.5–15.5)
WBC: 9.9 10*3/uL (ref 4.0–10.5)

## 2016-10-24 LAB — MRSA PCR SCREENING: MRSA BY PCR: NEGATIVE

## 2016-10-24 LAB — GLUCOSE, CAPILLARY: Glucose-Capillary: 102 mg/dL — ABNORMAL HIGH (ref 65–99)

## 2016-10-24 MED ORDER — LORAZEPAM 0.5 MG PO TABS
0.5000 mg | ORAL_TABLET | Freq: Once | ORAL | Status: AC
  Administered 2016-10-24: 0.5 mg via ORAL
  Filled 2016-10-24: qty 1

## 2016-10-24 MED ORDER — PANTOPRAZOLE SODIUM 40 MG PO TBEC
40.0000 mg | DELAYED_RELEASE_TABLET | Freq: Every day | ORAL | Status: DC
  Administered 2016-10-24 – 2016-11-20 (×25): 40 mg via ORAL
  Filled 2016-10-24 (×25): qty 1

## 2016-10-24 MED ORDER — FENTANYL CITRATE (PF) 100 MCG/2ML IJ SOLN
25.0000 ug | INTRAMUSCULAR | Status: DC | PRN
  Administered 2016-10-24 – 2016-10-29 (×11): 25 ug via INTRAVENOUS
  Filled 2016-10-24 (×12): qty 2

## 2016-10-24 MED ORDER — OXYCODONE HCL 5 MG PO TABS
10.0000 mg | ORAL_TABLET | Freq: Once | ORAL | Status: AC
  Administered 2016-10-24: 10 mg via ORAL
  Filled 2016-10-24: qty 2

## 2016-10-24 MED ORDER — BUPROPION HCL ER (XL) 150 MG PO TB24
150.0000 mg | ORAL_TABLET | Freq: Every day | ORAL | Status: DC
  Administered 2016-10-24 – 2016-11-11 (×18): 150 mg via ORAL
  Filled 2016-10-24 (×19): qty 1

## 2016-10-24 MED ORDER — OXYCODONE HCL 5 MG PO TABS
5.0000 mg | ORAL_TABLET | ORAL | Status: DC | PRN
  Administered 2016-10-24 – 2016-10-25 (×5): 10 mg via ORAL
  Filled 2016-10-24 (×6): qty 2

## 2016-10-24 MED ORDER — ESCITALOPRAM OXALATE 20 MG PO TABS: 20.0000 mg | ORAL_TABLET | Freq: Every day | ORAL | Status: DC

## 2016-10-24 MED ORDER — BUPROPION HCL ER (XL) 150 MG PO TB24: 150.0000 mg | ORAL_TABLET | Freq: Every day | ORAL | Status: DC

## 2016-10-24 MED ORDER — BUPROPION HCL ER (XL) 300 MG PO TB24
300.0000 mg | ORAL_TABLET | Freq: Every day | ORAL | Status: DC
  Administered 2016-10-24 – 2016-11-11 (×18): 300 mg via ORAL
  Filled 2016-10-24 (×9): qty 2
  Filled 2016-10-24: qty 1
  Filled 2016-10-24 (×8): qty 2
  Filled 2016-10-24: qty 1

## 2016-10-24 MED ORDER — PAROXETINE HCL 20 MG PO TABS
40.0000 mg | ORAL_TABLET | Freq: Every day | ORAL | Status: DC
  Administered 2016-10-24 – 2016-10-31 (×8): 40 mg via ORAL
  Filled 2016-10-24 (×8): qty 2

## 2016-10-24 MED ORDER — VITAMIN D (ERGOCALCIFEROL) 1.25 MG (50000 UNIT) PO CAPS
50000.0000 [IU] | ORAL_CAPSULE | ORAL | Status: DC
  Administered 2016-10-24 – 2016-10-31 (×2): 50000 [IU] via ORAL
  Filled 2016-10-24 (×2): qty 1

## 2016-10-24 MED ORDER — OXYCODONE HCL 5 MG PO TABS: 10.0000 mg | ORAL_TABLET | Freq: Once | ORAL | Status: DC

## 2016-10-24 MED ORDER — PRAVASTATIN SODIUM 20 MG PO TABS
20.0000 mg | ORAL_TABLET | Freq: Every day | ORAL | Status: DC
  Administered 2016-10-24 – 2016-11-04 (×12): 20 mg via ORAL
  Filled 2016-10-24 (×12): qty 1

## 2016-10-24 MED ORDER — ISOSORBIDE DINITRATE 10 MG PO TABS
10.0000 mg | ORAL_TABLET | Freq: Every day | ORAL | Status: DC
Start: 2016-10-24 — End: 2016-10-26
  Administered 2016-10-24 – 2016-10-25 (×2): 10 mg via ORAL
  Filled 2016-10-24 (×2): qty 1

## 2016-10-24 MED ORDER — AMLODIPINE BESYLATE 10 MG PO TABS
10.0000 mg | ORAL_TABLET | Freq: Every day | ORAL | Status: DC
Start: 2016-10-24 — End: 2016-11-05
  Administered 2016-10-24 – 2016-11-03 (×11): 10 mg via ORAL
  Filled 2016-10-24 (×13): qty 1
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
## 2016-10-24 NOTE — Progress Notes (Signed)
Patient takes anti-anxiety medication at home and he is currently having anxiety. MD notified. Orders placed. Will continue to monitor.

## 2016-10-24 NOTE — Progress Notes (Addendum)
Patient ID: Hayden [name], male   DOB: 04/09/1971, 46 y.o.   MRN: [id number]  [facility] Progress Note   Assessment/ Plan:   1. Right foot cellulitis with poor wound healing and appearance of PAD: Failed outpatient oral antibiotic therapy and admitted with nonhealing wound and started on broad-spectrum antimicrobial coverage with vancomycin and Zosyn. Will consult vascular surgery for evaluation of PAD. 2. ESRD: Continue peritoneal dialysis on current prescription, he appears to be euvolemic and without any critical appearing electrolytes. He has a left radiocephalic fistula for backup access. 3. Anemia: Hemoglobin levels within acceptable limit, monitor for ESA 4. CKD-MBD: Continue Sensipar and calcitriol for PTH suppression, on calcium acetate for phosphorus binding 5. Nutrition: Continue renal diet with lean protein supplementation 6. Hypertension: Elevated blood pressures noted, on multiple agents including amlodipine, clonidine, labetalol and hydralazine. Will use 4% dianeal solution to promote ultrafiltration/improved blood pressures. He denies significant pain.  Subjective:   Reports intermittent pain of right leg-worsens upon weightbearing. Denies any chest pain or shortness of breath. Denies any focal neurological symptoms.    Objective:   BP (!) 197/105 (BP Location: Right Arm)   Pulse 91   Temp 98.2 F (36.8 C) (Oral)   Resp 18   Ht 5\' 10"  (1.778 m)   Wt 93.1 kg (205 lb 3.2 oz)   SpO2 94%   BMI 29.44 kg/m   Physical Exam: [id number] to be comfortable resting in bed-somnolent (wife states that he has not slept well) CVS: Pulse regular rhythm, normal rate, ejection systolic murmur over apex Resp: Clear to auscultation, no rales/rhonchi Abd: Soft, obese, nontender Ext: Right leg with necrotic appearing eschar over hallux/second digit. Area of tenderness and dusky appearance over plantar surface adjacent to heel.  Labs: BMET  Recent Labs Lab  10/23/16 1720 10/24/16 0458  NA 137 138  K 5.4* 4.6  CL 98* 98*  CO2 21* 21*  GLUCOSE 106* 70  BUN 102* 96*  CREATININE 13.79* 13.22*  CALCIUM 7.7* 7.9*   CBC  Recent Labs Lab 10/23/16 1720 10/24/16 0458  WBC 14.6* 9.9  NEUTROABS 12.3*  --   HGB 11.3* 10.4*  HCT 35.3* 32.5*  MCV 77.4* 78.1  PLT 427* 435*   Medications:    . amLODipine  10 mg Oral Daily  . aspirin EC  81 mg Oral Daily  . calcitRIOL  0.5 mcg Oral Daily  . calcium acetate  1,334 mg Oral TID WC  . cinacalcet  60 mg Oral Q breakfast  . cloNIDine  0.2 mg Transdermal Weekly  . gentamicin cream  1 application Topical Daily  . heparin  5,000 Units Subcutaneous Q8H  . hydrALAZINE  100 mg Oral BID  . labetalol  150 mg Oral BID  . saccharomyces boulardii  250 mg Oral BID   Elmarie Shiley, MD 10/24/2016, 9:20 AM

## 2016-10-24 NOTE — Progress Notes (Signed)
PHARMACIST - PHYSICIAN COMMUNICATION DR:   [place] CONCERNING:  Patient taking both paroxetine and escitalopram at home   RECOMMENDATION: Please ensure escitalopram is not on medication list at discharge.  DESCRIPTION: Spoke with Dr. Moshe Cipro via phone and explained he has been taking two SSRI's. QTc is prolonged at 507 currently. She instructed me to keep patient on paroxetine and discontinue escitalopram. I have taken escitalopram off of med hx in attempt to prevent an error. I have also counseled patient to stop this medication.   Renold Genta, PharmD, BCPS Clinical Pharmacist Phone for today - [place] - [phone] 10/24/2016 4:07 PM

## 2016-10-24 NOTE — Progress Notes (Signed)
[facility]                                                                              Patient Demographics  Hayden [name], is a 46 y.o. male, DOB - [birth year], ELF:[id number]  Admit date - 10/23/2016   Admitting Physician Norval Morton, MD  Outpatient Primary MD for the patient is Louis Meckel, MD  Outpatient specialists:   LOS - 0  days    Chief Complaint  Patient presents with  . Foot Injury       Brief summary  46 y.o. 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 The initial injury happened over one month ago when his foot caught in the track of the back seat of his sisters mini [name]. He initially tried to keep the area clean and washed. However, develop redness, swelling and pain. Approximately 2 weeks ago he was seen at the emergency department and was given 1 dose of vancomycin, but ended up leaving AMA. Patient saw his PCP who prescribed clindamycin, patient took for last 12-13 days however states that the pain continued to get worse and with the drainage from the wound.    Assessment & Plan    Principal Problem: Cellulitis of the right foot/Right great toe:  - failed treatment with outpatient antibiotics of clindamycin - Continue empiric antibiotics of vancomycin and Zosyn - Vascular surgery consulted, ABIs - Vascular surgery recommended Dr. Sharol Given to evaluate the right foot as the great toe may not be salvageable however I spoke with Dr. Sharol Given and he is not currently available and out of town, earliest he will be able to see the patient on Monday if he is in the hospital still.  Active problems Hypertensive urgency: Blood pressures noted to be as high as 240/110 on admission. Patient notes not recently taking home blood pressure medications and therefore these were given while in the ED - Continue clonidine patch, hydralazine, labetalol    - Hydralazine IV prn sBP > 180   Leukocytosis: WBC elevated at 14.6. Suspect secondary to above. - Resolved, continue IV antibiotics  ESRD on peritoneal dialysis: Patient receives nightly peritoneal pulses dialysis. Initial BUN 102, creatinine 13.79. - Nephrology consulted for peritoneal dialysis - Continue Sensipar, calcitriol, and PhosLo  Hyperkalemia: Acute. Initial potassium 5.4 on admission. - Resolved  Elevated anion gap likely secondary to uremia. Suspect will improve following dialysis.  Elevated alkaline phosphatase -Improving  Anxiety and depression - Continue Xanax q HS prn anxiety  Prolonged QTC - Avoid QTC prolonging medications, discontinued Lexapro   Code Status: Full CODE STATUS DVT Prophylaxis:  Heparin subcutaneous Family Communication: Discussed in detail with the patient, all imaging results, lab results explained to the patient and wife   Disposition Plan:   Time Spent in minutes   25 minutes  Procedures:  Hemodialysis  Consultants:   Nephrology Vascular surgery  Antimicrobials:   Vancomycin with HD IV 4/25>  Zosyn IV 4/25>   Medications  Scheduled Meds: . amLODipine  10 mg Oral Daily  . aspirin EC  81 mg  Oral Daily  . buPROPion  150 mg Oral Daily   And  . buPROPion  300 mg Oral QHS  . calcitRIOL  0.5 mcg Oral Daily  . calcium acetate  1,334 mg Oral TID WC  . cinacalcet  60 mg Oral Q breakfast  . cloNIDine  0.2 mg Transdermal Weekly  . gentamicin cream  1 application Topical Daily  . heparin  5,000 Units Subcutaneous Q8H  . hydrALAZINE  100 mg Oral BID  . isosorbide dinitrate  10 mg Oral Daily  . labetalol  150 mg Oral BID  . pantoprazole  40 mg Oral Daily  . PARoxetine  40 mg Oral Daily  . pravastatin  20 mg Oral Daily  . saccharomyces boulardii  250 mg Oral BID  . Vitamin D (Ergocalciferol)  50,000 Units Oral Q7 days   Continuous Infusions: . dialysis solution 2.5% low-MG/low-CA    . piperacillin-tazobactam  (ZOSYN)  IV Stopped (10/24/16 1041)   PRN Meds:.acetaminophen **OR** acetaminophen, albuterol, ALPRAZolam, fentaNYL (SUBLIMAZE) injection, heparin, hydrALAZINE, ondansetron **OR** ondansetron (ZOFRAN) IV, oxyCODONE   Antibiotics   Anti-infectives    Start     Dose/Rate Route Frequency Ordered Stop   10/24/16 0600  piperacillin-tazobactam (ZOSYN) IVPB 3.375 g     3.375 g 12.5 mL/hr over 240 Minutes Intravenous Every 12 hours 10/23/16 1854     10/23/16 1845  piperacillin-tazobactam (ZOSYN) IVPB 3.375 g     3.375 g 100 mL/hr over 30 Minutes Intravenous  Once 10/23/16 1837 10/23/16 1928   10/23/16 1845  vancomycin (VANCOCIN) 2,000 mg in sodium chloride 0.9 % 500 mL IVPB     2,000 mg 250 mL/hr over 120 Minutes Intravenous  Once 10/23/16 1842 10/23/16 2131        Subjective:   Hayden [name] was seen and examined today. Complaining of pain in the right foot, 7 out of 10, the pain medications not lasting long enough. Patient denies dizziness, chest pain, shortness of breath, abdominal pain, N/V/D/C, new weakness, numbess, tingling. No acute events overnight.    Objective:   Vitals:   10/24/16 0810 10/24/16 0947 10/24/16 1149 10/24/16 1446  BP: (!) 197/105  (!) 157/77 (!) 172/96  Pulse: 91 91 84 87  Resp: 18  (!) 22   Temp: 98.2 F (36.8 C)  98 F (36.7 C) 98.7 F (37.1 C)  TempSrc: Oral   Oral  SpO2: 94%   95%  Weight:   91.6 kg (201 lb 15.1 oz)   Height:    5\' 10"  (1.778 m)    Intake/Output Summary (Last 24 hours) at 10/24/16 1557 Last data filed at 10/24/16 1300  Gross per 24 hour  Intake            15747 ml  Output            16503 ml  Net             -756 ml     Wt Readings from Last 3 Encounters:  10/24/16 91.6 kg (201 lb 15.1 oz)  07/12/16 99.8 kg (220 lb)  11/18/15 101.1 kg (222 lb 14.2 oz)     Exam  General: Alert and oriented x 3, NAD  HEENT:    Neck: Supple, no JVD, no masses  Cardiovascular: S1 S2 auscultated, no rubs, murmurs or gallops.  Regular rate and rhythm.  Respiratory: Clear to auscultation bilaterally, no wheezing, rales or rhonchi  Gastrointestinal: Soft, nontender, nondistended, + bowel sounds, PD cath in place  Ext: no cyanosis clubbing  or edema  Neuro: AAOx3, Cr N's II- XII. Strength 5/5 upper and lower extremities bilaterally  Skin: Dry gangrenous appearing changes on the right great toe with multiple areas of eschar   Psych: Normal affect and demeanor, alert and oriented x3    Data Reviewed:  I have personally reviewed following labs and imaging studies  Micro Results Recent Results (from the past 240 hour(s))  Blood culture (routine x 2)     Status: None (Preliminary result)   Collection Time: 10/23/16  5:20 PM  Result Value Ref Range Status   Specimen Description BLOOD RIGHT ANTECUBITAL  Final   Special Requests   Final    BOTTLES DRAWN AEROBIC AND ANAEROBIC Blood Culture adequate volume   Culture NO GROWTH < 24 HOURS  Final   Report Status PENDING  Incomplete  Blood culture (routine x 2)     Status: None (Preliminary result)   Collection Time: 10/23/16  6:26 PM  Result Value Ref Range Status   Specimen Description BLOOD RIGHT ANTECUBITAL  Final   Special Requests   Final    BOTTLES DRAWN AEROBIC AND ANAEROBIC Blood Culture adequate volume   Culture NO GROWTH < 24 HOURS  Final   Report Status PENDING  Incomplete  MRSA PCR Screening     Status: None   Collection Time: 10/24/16  5:03 AM  Result Value Ref Range Status   MRSA by PCR NEGATIVE NEGATIVE Final    Comment:        The GeneXpert MRSA Assay (FDA approved for NASAL specimens only), is one component of a comprehensive MRSA colonization surveillance program. It is not intended to diagnose MRSA infection nor to guide or monitor treatment for MRSA infections.     Radiology Reports Dg Foot Complete Right  Result Date: 10/23/2016 CLINICAL DATA:  Recent foot injury 2 weeks ago with persistent pain and discoloration EXAM: RIGHT FOOT  COMPLETE - 3+ VIEW COMPARISON:  None. FINDINGS: No acute fracture or dislocation is noted. Diffuse vascular calcifications are seen. IMPRESSION: No acute abnormality noted. Electronically Signed   By: Inez Catalina M.D.   On: 10/23/2016 17:50    Lab Data:  CBC:  Recent Labs Lab 10/23/16 1720 10/24/16 0458  WBC 14.6* 9.9  NEUTROABS 12.3*  --   HGB 11.3* 10.4*  HCT 35.3* 32.5*  MCV 77.4* 78.1  PLT 427* 295*   Basic Metabolic Panel:  Recent Labs Lab 10/23/16 1720 10/24/16 0458  NA 137 138  K 5.4* 4.6  CL 98* 98*  CO2 21* 21*  GLUCOSE 106* 70  BUN 102* 96*  CREATININE 13.79* 13.22*  CALCIUM 7.7* 7.9*   GFR: Estimated Creatinine Clearance: 8.1 mL/min (A) (by C-G formula based on SCr of 13.22 mg/dL (H)). Liver Function Tests:  Recent Labs Lab 10/23/16 1720 10/24/16 0458  AST 28 22  ALT 49 43  ALKPHOS 264* 243*  BILITOT 1.2 1.2  PROT 6.4* 6.0*  ALBUMIN 2.6* 2.5*   No results for input(s): LIPASE, AMYLASE in the last 168 hours. No results for input(s): AMMONIA in the last 168 hours. Coagulation Profile: No results for input(s): INR, PROTIME in the last 168 hours. Cardiac Enzymes: No results for input(s): CKTOTAL, CKMB, CKMBINDEX, TROPONINI in the last 168 hours. BNP (last 3 results) No results for input(s): PROBNP in the last 8760 hours. HbA1C: No results for input(s): HGBA1C in the last 72 hours. CBG: No results for input(s): GLUCAP in the last 168 hours. Lipid Profile: No results for input(s): CHOL, HDL,  LDLCALC, TRIG, CHOLHDL, LDLDIRECT in the last 72 hours. Thyroid Function Tests: No results for input(s): TSH, T4TOTAL, FREET4, T3FREE, THYROIDAB in the last 72 hours. Anemia Panel: No results for input(s): VITAMINB12, FOLATE, FERRITIN, TIBC, IRON, RETICCTPCT in the last 72 hours. Urine analysis:    Component Value Date/Time   COLORURINE AMBER (A) 11/04/2015 1930   APPEARANCEUR TURBID (A) 11/04/2015 1930   LABSPEC 1.019 11/04/2015 1930   PHURINE 5.5  11/04/2015 1930   GLUCOSEU 100 (A) 11/04/2015 1930   HGBUR NEGATIVE 11/04/2015 1930   BILIRUBINUR NEGATIVE 11/04/2015 1930   KETONESUR NEGATIVE 11/04/2015 1930   PROTEINUR >300 (A) 11/04/2015 1930   UROBILINOGEN 0.2 09/22/2012 1805   NITRITE NEGATIVE 11/04/2015 1930   LEUKOCYTESUR NEGATIVE 11/04/2015 1930     Ripudeep Rai M.D. [facility] 10/24/2016, 3:57 PM  Pager: [phone] Between 7am to 7pm - call Pager - [phone]  After 7pm go to [email or web address] - password [email or web address]  Call night coverage person covering after 7pm

## 2016-10-25 ENCOUNTER — Ambulatory Visit (HOSPITAL_COMMUNITY): Payer: Medicare Other

## 2016-10-25 ENCOUNTER — Inpatient Hospital Stay (HOSPITAL_COMMUNITY): Payer: Medicare Other

## 2016-10-25 DIAGNOSIS — I70269 Atherosclerosis of native arteries of extremities with gangrene, unspecified extremity: Secondary | ICD-10-CM

## 2016-10-25 LAB — VAS US LOWER EXTREMITY ARTERIAL DUPLEX
LEFT PERO DIST SYS: 67 cm/s
LPOPDPSV: 741 cm/s
LPOPPPSV: 40 cm/s
LSFDPSV: -140 cm/s
Left ant tibial distal sys: 80 cm/s
Left ant tibial sys PSV: -691 cm/s
Left super femoral mid sys PSV: -107 cm/s
Left super femoral prox sys PSV: -116 cm/s
RIGHT POST TIB DIST SYS: 26 cm/s
RIGHT POST TIB MID SYS: 27 cm/s
RPOPDPSV: 20 cm/s
RSFPPSV: -24 cm/s
Right peroneal sys PSV: 36 cm/s
Right super femoral dist sys PSV: 0 cm/s
Right super femoral mid sys PSV: -33 cm/s

## 2016-10-25 MED ORDER — OXYCODONE HCL 5 MG PO TABS
10.0000 mg | ORAL_TABLET | ORAL | Status: DC | PRN
  Administered 2016-10-25 – 2016-11-03 (×31): 10 mg via ORAL
  Filled 2016-10-25 (×31): qty 2

## 2016-10-25 MED ORDER — ALPRAZOLAM 0.5 MG PO TABS
1.0000 mg | ORAL_TABLET | Freq: Two times a day (BID) | ORAL | Status: DC
Start: 2016-10-25 — End: 2016-11-03
  Administered 2016-10-25 – 2016-11-02 (×15): 1 mg via ORAL
  Filled 2016-10-25 (×19): qty 2

## 2016-10-25 MED ORDER — ZOLPIDEM TARTRATE 5 MG PO TABS
5.0000 mg | ORAL_TABLET | Freq: Every day | ORAL | Status: DC
  Administered 2016-10-25 – 2016-11-02 (×6): 5 mg via ORAL
  Filled 2016-10-25 (×8): qty 1

## 2016-10-25 NOTE — Progress Notes (Signed)
Patient ID: Hayden [name], male   DOB: [birth year], 46 y.o.   MRN: [id number]  [facility] Progress Note   Assessment/ Plan:   1. Right foot cellulitis with poor wound healing and appearance of PAD: Currently on Zosyn after initial treatment with vancomycin/Zosyn following failure of outpatient antibiotic therapy. Awaiting ABI and will determine decision for arteriogram on Monday by vascular surgery. 2. ESRD: Continue CCPD on current prescription, he is slightly hypervolemic and without any concerning electrolytes. He has a left radiocephalic fistula for backup access. Will try Ambien for insomnia. 3. Anemia: Hemoglobin levels within acceptable limit, monitor for ESA needs 4. CKD-MBD: Continue Sensipar and calcitriol for PTH suppression, on calcium acetate for phosphorus binding 5. Nutrition: Continue renal diet with lean protein supplementation 6. Hypertension:  with some improvement of blood pressures noted with using 4% PD solution to try and improve ultrafiltration/volume unloading-reassess oral antihypertensive agents tomorrow.  Subjective:   Reports to be feeling fair with tolerable leg pain. Still having difficulty sleeping.    Objective:   BP (!) 177/90 (BP Location: Right Arm)   Pulse 87   Temp 98.5 F (36.9 C) (Oral)   Resp 18   Ht 5\' 10"  (1.778 m)   Wt 92.1 kg (203 lb 1.6 oz)   SpO2 92%   BMI 29.14 kg/m   Physical Exam: Gen: Resting comfortably in bed, intermittently nodding off. Wife at bedside CVS: Pulse regular rhythm, normal rate, S1 and S2 normal Resp: Clear to auscultation, no rales/rhonchi Abd: Soft, obese, nontender Ext: Right leg hallux with necrotic eschar adjacent to the toenail. Multiple other areas of eschar also noted over the foot.  Labs: BMET  Recent Labs Lab 10/23/16 1720 10/24/16 0458  NA 137 138  K 5.4* 4.6  CL 98* 98*  CO2 21* 21*  GLUCOSE 106* 70  BUN 102* 96*  CREATININE 13.79* 13.22*  CALCIUM 7.7* 7.9*   CBC  Recent  Labs Lab 10/23/16 1720 10/24/16 0458  WBC 14.6* 9.9  NEUTROABS 12.3*  --   HGB 11.3* 10.4*  HCT 35.3* 32.5*  MCV 77.4* 78.1  PLT 427* 435*   Medications:    . ALPRAZolam  1 mg Oral BID  . amLODipine  10 mg Oral Daily  . aspirin EC  81 mg Oral Daily  . buPROPion  150 mg Oral Daily   And  . buPROPion  300 mg Oral QHS  . calcitRIOL  0.5 mcg Oral Daily  . calcium acetate  1,334 mg Oral TID WC  . cinacalcet  60 mg Oral Q breakfast  . cloNIDine  0.2 mg Transdermal Weekly  . gentamicin cream  1 application Topical Daily  . heparin  5,000 Units Subcutaneous Q8H  . hydrALAZINE  100 mg Oral BID  . isosorbide dinitrate  10 mg Oral Daily  . labetalol  150 mg Oral BID  . pantoprazole  40 mg Oral Daily  . PARoxetine  40 mg Oral Daily  . pravastatin  20 mg Oral Daily  . saccharomyces boulardii  250 mg Oral BID  . Vitamin D (Ergocalciferol)  50,000 Units Oral Q7 days   Elmarie Shiley, MD 10/25/2016, 9:29 AM

## 2016-10-25 NOTE — Progress Notes (Signed)
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 The posterior tibial and peroneal arteries appeared patent with monophasic flow. The anterior tibial artery appeared occluded. Left lower extremity arterial system revealed a high grade stenosis at the distal tibial/peroneal trunk into the origin of anterior tibial artery with elevated velocities reaching greater than 741 cm/sec. The posterior tibial artery appeared occluded. The peroneal artery is patent. Oda Cogan, BS, RDMS, RVT

## 2016-10-25 NOTE — Progress Notes (Signed)
   Daily Progress Note  Awaiting BLE ABI.  If abnormal, will schedule for angiogram on Monday.  Dr. Scot Dock will be covering for me this weekend.   Adele Barthel, MD, FACS [facility] Office: [phone] Pager: [phone]  10/25/2016, 7:08 AM

## 2016-10-25 NOTE — Progress Notes (Signed)
Preliminary results by tech - Unsuccessful attempt to perform test due to patient's inability to cooperate - frequent movement. Oda Cogan, BS, RDMS, RVT

## 2016-10-25 NOTE — Progress Notes (Signed)
Called to patient room secondary for pain in right foot.Patient verbalized he got up to go to bathroom and felt like something cracked on bottom of foot.Upon looking at foot noticed dry and bruised no visible open area's seen.Patient complaining of pain 9/10 dose of fentanyl given.Will continue to monitor.Patient wife at bedside.

## 2016-10-25 NOTE — Progress Notes (Signed)
VASCULAR LAB PRELIMINARY  ARTERIAL  ABI completed: Unsuccessful attempt to perform ABI on the right side due to patient not able to cooperate. - rest pain movement.  Left sided waveforms were abnormal - monophasic with non compressible vessels.     RIGHT    LEFT    PRESSURE WAVEFORM  PRESSURE WAVEFORM  BRACHIAL 210 Triphasic BRACHIAL N/A AVF   DP 0  DP Non comp Mono  AT   AT    PT 0  PT  Mono  GREAT TOE  NA GREAT TOE  NA    RIGHT LEFT  ABI       Hayden [name] D, RVT 10/25/2016, 12:00 PM

## 2016-10-25 NOTE — Progress Notes (Signed)
[facility]                                                                              Patient Demographics  Hayden [name], is a 46 y.o. male, DOB - 1970-10-14, DGL:[id number]  Admit date - 10/23/2016   Admitting Physician Norval Morton, MD  Outpatient Primary MD for the patient is Louis Meckel, MD  Outpatient specialists:   LOS - 1  days    Chief Complaint  Patient presents with  . Foot Injury       Brief summary  46 y.o. 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 The initial injury happened over one month ago when his foot caught in the track of the back seat of his sisters mini [name]. He initially tried to keep the area clean and washed. However, develop redness, swelling and pain. Approximately 2 weeks ago he was seen at the emergency department and was given 1 dose of vancomycin, but ended up leaving AMA. Patient saw his PCP who prescribed clindamycin, patient took for last 12-13 days however states that the pain continued to get worse and with the drainage from the wound.    Assessment & Plan    Principal Problem: Cellulitis of the right foot/Right great toe:  - failed treatment with outpatient antibiotics of clindamycin - Continue empiric antibiotics of vancomycin and Zosyn - Vascular surgery consulted, Recommended ABIs, Angiogram on Monday - I had discussed with Dr. Sharol Given, will be available on Monday to see the patient - Complaining of intolerable pain, continue fentanyl IV for breakthrough pain, increased oxycodone to 10 mg every 3 hours as needed  Active problems Hypertensive urgency: Blood pressures noted to be as high as 240/110 on admission. Patient notes not recently taking home blood pressure medications and therefore these were given while in the ED - Continue clonidine patch, hydralazine, labetalol  - BP still uncontrolled.  Hydralazine IV prn sBP > 180   Leukocytosis: WBC elevated at 14.6. Suspect secondary to above. - Resolved, continue IV antibiotics  ESRD on peritoneal dialysis: Patient receives nightly peritoneal pulses dialysis. Initial BUN 102, creatinine 13.79. - Nephrology consulted for peritoneal dialysis - Continue Sensipar, calcitriol, and PhosLo  Hyperkalemia: Acute. Initial potassium 5.4 on admission. - Resolved  Elevated anion gap likely secondary to uremia. Suspect will improve following dialysis.  Elevated alkaline phosphatase -Improving  Anxiety and depression - Very anxious, increased Xanax to twice a day  Prolonged QTC - Avoid QTC prolonging medications, discontinued Lexapro   Code Status: Full CODE STATUS DVT Prophylaxis:  Heparin subcutaneous Family Communication: Discussed in detail with the patient, all imaging results, lab results explained to the patient and wife   Disposition Plan:   Time Spent in minutes   25 minutes  Procedures:  Hemodialysis  Consultants:   Nephrology Vascular surgery  Antimicrobials:   Vancomycin with HD IV 4/25>  Zosyn IV 4/25>   Medications  Scheduled Meds: . ALPRAZolam  1 mg Oral BID  . amLODipine  10 mg Oral Daily  . aspirin EC  81 mg  Oral Daily  . buPROPion  150 mg Oral Daily   And  . buPROPion  300 mg Oral QHS  . calcitRIOL  0.5 mcg Oral Daily  . calcium acetate  1,334 mg Oral TID WC  . cinacalcet  60 mg Oral Q breakfast  . cloNIDine  0.2 mg Transdermal Weekly  . gentamicin cream  1 application Topical Daily  . heparin  5,000 Units Subcutaneous Q8H  . hydrALAZINE  100 mg Oral BID  . isosorbide dinitrate  10 mg Oral Daily  . labetalol  150 mg Oral BID  . pantoprazole  40 mg Oral Daily  . PARoxetine  40 mg Oral Daily  . pravastatin  20 mg Oral Daily  . saccharomyces boulardii  250 mg Oral BID  . Vitamin D (Ergocalciferol)  50,000 Units Oral Q7 days  . zolpidem  5 mg Oral QHS   Continuous Infusions: .  dialysis solution 2.5% low-MG/low-CA    . piperacillin-tazobactam (ZOSYN)  IV 3.375 g (10/25/16 1030)   PRN Meds:.acetaminophen **OR** acetaminophen, albuterol, fentaNYL (SUBLIMAZE) injection, heparin, hydrALAZINE, ondansetron **OR** ondansetron (ZOFRAN) IV, oxyCODONE   Antibiotics   Anti-infectives    Start     Dose/Rate Route Frequency Ordered Stop   10/24/16 0600  piperacillin-tazobactam (ZOSYN) IVPB 3.375 g     3.375 g 12.5 mL/hr over 240 Minutes Intravenous Every 12 hours 10/23/16 1854     10/23/16 1845  piperacillin-tazobactam (ZOSYN) IVPB 3.375 g     3.375 g 100 mL/hr over 30 Minutes Intravenous  Once 10/23/16 1837 10/23/16 1928   10/23/16 1845  vancomycin (VANCOCIN) 2,000 mg in sodium chloride 0.9 % 500 mL IVPB     2,000 mg 250 mL/hr over 120 Minutes Intravenous  Once 10/23/16 1842 10/23/16 2131        Subjective:   Hayden [name] was seen and examined today. Complaining of pain in the right Lower extremity and foot, 10/10, the pain medications not lasting long enough. Very anxious. Patient denies dizziness, chest pain, shortness of breath, abdominal pain, N/V/D/C, new weakness, numbess, tingling. Not sleeping well.  Objective:   Vitals:   10/24/16 2120 10/24/16 2130 10/25/16 0527 10/25/16 0959  BP: (!) 170/94 (!) 182/78 (!) 177/90 (!) 191/94  Pulse: 88 92 87 90  Resp: 20 18 18 20   Temp: 98.9 F (37.2 C) 98.2 F (36.8 C) 98.5 F (36.9 C) 98.4 F (36.9 C)  TempSrc: Oral Oral Oral Oral  SpO2: 94%  92% 97%  Weight: 92.1 kg (203 lb 1.6 oz)     Height: 5\' 10"  (1.778 m)       Intake/Output Summary (Last 24 hours) at 10/25/16 1333 Last data filed at 10/25/16 1030  Gross per 24 hour  Intake              960 ml  Output                0 ml  Net              960 ml     Wt Readings from Last 3 Encounters:  10/24/16 92.1 kg (203 lb 1.6 oz)  07/12/16 99.8 kg (220 lb)  11/18/15 101.1 kg (222 lb 14.2 oz)     Exam  General: Alert and oriented x 3, NAD  HEENT:     Neck: Supple, no JVD, no masses  Cardiovascular: S1 S2 Clear, RRR  Respiratory: Clear to auscultation bilaterally, no wheezing, rales or rhonchi  Gastrointestinal: Soft, nontender, nondistended, + bowel sounds,  PD cath in place  Ext: no cyanosis clubbing or edema  Neuro: no new deficits  Skin: Dry gangrenous appearing changes on the right great toe with multiple areas of eschar   Psych: Normal affect and demeanor, alert and oriented x3    Data Reviewed:  I have personally reviewed following labs and imaging studies  Micro Results Recent Results (from the past 240 hour(s))  Blood culture (routine x 2)     Status: None (Preliminary result)   Collection Time: 10/23/16  5:20 PM  Result Value Ref Range Status   Specimen Description BLOOD RIGHT ANTECUBITAL  Final   Special Requests   Final    BOTTLES DRAWN AEROBIC AND ANAEROBIC Blood Culture adequate volume   Culture NO GROWTH 2 DAYS  Final   Report Status PENDING  Incomplete  Blood culture (routine x 2)     Status: None (Preliminary result)   Collection Time: 10/23/16  6:26 PM  Result Value Ref Range Status   Specimen Description BLOOD RIGHT ANTECUBITAL  Final   Special Requests   Final    BOTTLES DRAWN AEROBIC AND ANAEROBIC Blood Culture adequate volume   Culture NO GROWTH 2 DAYS  Final   Report Status PENDING  Incomplete  MRSA PCR Screening     Status: None   Collection Time: 10/24/16  5:03 AM  Result Value Ref Range Status   MRSA by PCR NEGATIVE NEGATIVE Final    Comment:        The GeneXpert MRSA Assay (FDA approved for NASAL specimens only), is one component of a comprehensive MRSA colonization surveillance program. It is not intended to diagnose MRSA infection nor to guide or monitor treatment for MRSA infections.     Radiology Reports Dg Foot Complete Right  Result Date: 10/23/2016 CLINICAL DATA:  Recent foot injury 2 weeks ago with persistent pain and discoloration EXAM: RIGHT FOOT COMPLETE - 3+ VIEW  COMPARISON:  None. FINDINGS: No acute fracture or dislocation is noted. Diffuse vascular calcifications are seen. IMPRESSION: No acute abnormality noted. Electronically Signed   By: Inez Catalina M.D.   On: 10/23/2016 17:50    Lab Data:  CBC:  Recent Labs Lab 10/23/16 1720 10/24/16 0458  WBC 14.6* 9.9  NEUTROABS 12.3*  --   HGB 11.3* 10.4*  HCT 35.3* 32.5*  MCV 77.4* 78.1  PLT 427* 458*   Basic Metabolic Panel:  Recent Labs Lab 10/23/16 1720 10/24/16 0458  NA 137 138  K 5.4* 4.6  CL 98* 98*  CO2 21* 21*  GLUCOSE 106* 70  BUN 102* 96*  CREATININE 13.79* 13.22*  CALCIUM 7.7* 7.9*   GFR: Estimated Creatinine Clearance: 8.1 mL/min (A) (by C-G formula based on SCr of 13.22 mg/dL (H)). Liver Function Tests:  Recent Labs Lab 10/23/16 1720 10/24/16 0458  AST 28 22  ALT 49 43  ALKPHOS 264* 243*  BILITOT 1.2 1.2  PROT 6.4* 6.0*  ALBUMIN 2.6* 2.5*   No results for input(s): LIPASE, AMYLASE in the last 168 hours. No results for input(s): AMMONIA in the last 168 hours. Coagulation Profile: No results for input(s): INR, PROTIME in the last 168 hours. Cardiac Enzymes: No results for input(s): CKTOTAL, CKMB, CKMBINDEX, TROPONINI in the last 168 hours. BNP (last 3 results) No results for input(s): PROBNP in the last 8760 hours. HbA1C: No results for input(s): HGBA1C in the last 72 hours. CBG:  Recent Labs Lab 10/24/16 2126  GLUCAP 102*   Lipid Profile: No results for input(s): CHOL, HDL, LDLCALC,  TRIG, CHOLHDL, LDLDIRECT in the last 72 hours. Thyroid Function Tests: No results for input(s): TSH, T4TOTAL, FREET4, T3FREE, THYROIDAB in the last 72 hours. Anemia Panel: No results for input(s): VITAMINB12, FOLATE, FERRITIN, TIBC, IRON, RETICCTPCT in the last 72 hours. Urine analysis:    Component Value Date/Time   COLORURINE AMBER (A) 11/04/2015 1930   APPEARANCEUR TURBID (A) 11/04/2015 1930   LABSPEC 1.019 11/04/2015 1930   PHURINE 5.5 11/04/2015 1930    GLUCOSEU 100 (A) 11/04/2015 1930   HGBUR NEGATIVE 11/04/2015 1930   BILIRUBINUR NEGATIVE 11/04/2015 1930   KETONESUR NEGATIVE 11/04/2015 1930   PROTEINUR >300 (A) 11/04/2015 1930   UROBILINOGEN 0.2 09/22/2012 1805   NITRITE NEGATIVE 11/04/2015 1930   LEUKOCYTESUR NEGATIVE 11/04/2015 1930     Ripudeep Rai M.D. [facility] 10/25/2016, 1:33 PM  Pager: [phone] Between 7am to 7pm - call Pager - [phone]  After 7pm go to [email or web address] - password [email or web address]  Call night coverage person covering after 7pm

## 2016-10-26 LAB — BASIC METABOLIC PANEL
Anion gap: 19 — ABNORMAL HIGH (ref 5–15)
BUN: 82 mg/dL — AB (ref 6–20)
CHLORIDE: 98 mmol/L — AB (ref 101–111)
CO2: 21 mmol/L — ABNORMAL LOW (ref 22–32)
Calcium: 7.3 mg/dL — ABNORMAL LOW (ref 8.9–10.3)
Creatinine, Ser: 12.15 mg/dL — ABNORMAL HIGH (ref 0.61–1.24)
GFR calc Af Amer: 5 mL/min — ABNORMAL LOW (ref >60)
GFR, EST NON AFRICAN AMERICAN: 4 mL/min — AB (ref >60)
GLUCOSE: 98 mg/dL (ref 65–99)
POTASSIUM: 4.2 mmol/L (ref 3.5–5.1)
Sodium: 138 mmol/L (ref 135–145)

## 2016-10-26 LAB — CBC
HEMATOCRIT: 33.3 % — AB (ref 39.0–52.0)
Hemoglobin: 10.3 g/dL — ABNORMAL LOW (ref 13.0–17.0)
MCH: 24.4 pg — ABNORMAL LOW (ref 26.0–34.0)
MCHC: 30.9 g/dL (ref 30.0–36.0)
MCV: 78.9 fL (ref 78.0–100.0)
Platelets: 469 10*3/uL — ABNORMAL HIGH (ref 150–400)
RBC: 4.22 MIL/uL (ref 4.22–5.81)
RDW: 19 % — ABNORMAL HIGH (ref 11.5–15.5)
WBC: 14.5 10*3/uL — ABNORMAL HIGH (ref 4.0–10.5)

## 2016-10-26 LAB — VANCOMYCIN, RANDOM: Vancomycin Rm: 28

## 2016-10-26 MED ORDER — NEPRO/CARBSTEADY PO LIQD
237.0000 mL | Freq: Two times a day (BID) | ORAL | Status: DC
  Administered 2016-10-29 – 2016-11-07 (×2): 237 mL via ORAL
  Filled 2016-10-26 (×8): qty 237

## 2016-10-26 MED ORDER — ISOSORBIDE DINITRATE 10 MG PO TABS
5.0000 mg | ORAL_TABLET | Freq: Two times a day (BID) | ORAL | Status: DC
Start: 2016-10-26 — End: 2016-11-05
  Administered 2016-10-26 – 2016-10-30 (×10): 5 mg via ORAL
  Administered 2016-10-31: 0.5 mg via ORAL
  Administered 2016-10-31 – 2016-11-04 (×9): 5 mg via ORAL
  Filled 2016-10-26 (×21): qty 1

## 2016-10-26 MED ORDER — LABETALOL HCL 200 MG PO TABS
200.0000 mg | ORAL_TABLET | Freq: Two times a day (BID) | ORAL | Status: DC
  Administered 2016-10-26 – 2016-11-04 (×20): 200 mg via ORAL
  Filled 2016-10-26 (×20): qty 1

## 2016-10-26 MED ORDER — PRO-STAT SUGAR FREE PO LIQD
30.0000 mL | Freq: Two times a day (BID) | ORAL | Status: DC
  Administered 2016-10-29: 30 mL via ORAL
  Filled 2016-10-26 (×12): qty 30

## 2016-10-26 NOTE — Progress Notes (Signed)
[facility] Progress Note   Subjective:  seen in room. No CP or dyspnea. No issues with CCPD. + Severe R toe pain, undergoing vascular evaluation, likely for RLE angiogram on 4/30.  Objective Vitals:   10/25/16 1845 10/25/16 2057 10/25/16 2058 10/26/16 0631  BP: (!) 143/83 (!) 158/74 (!) 158/74 (!) 173/70  Pulse: 84  85 89  Resp:   18 18  Temp: 98.4 F (36.9 C)  98.7 F (37.1 C) 98.4 F (36.9 C)  TempSrc: Oral  Oral Oral  SpO2: 97%  93% 96%  Weight:   95.3 kg (210 lb)   Height:   5\' 10"  (1.778 m)    Physical Exam General: Well appearing male, appears in pain and rubbing R foot. Heart: RRR; no murmur. Lungs: CTAB Abdomen: soft, PD cath in place without erythema. Extremities: No LE edema, R great toe with necrotic wound present Dialysis Access: PD cath. L forearm AVF + thrill.  Additional Objective Labs: Basic Metabolic Panel:  Recent Labs Lab 10/23/16 1720 10/24/16 0458 10/26/16 0324  NA 137 138 138  K 5.4* 4.6 4.2  CL 98* 98* 98*  CO2 21* 21* 21*  GLUCOSE 106* 70 98  BUN 102* 96* 82*  CREATININE 13.79* 13.22* 12.15*  CALCIUM 7.7* 7.9* 7.3*   Liver Function Tests:  Recent Labs Lab 10/23/16 1720 10/24/16 0458  AST 28 22  ALT 49 43  ALKPHOS 264* 243*  BILITOT 1.2 1.2  PROT 6.4* 6.0*  ALBUMIN 2.6* 2.5*   CBC:  Recent Labs Lab 10/23/16 1720 10/24/16 0458 10/26/16 0324  WBC 14.6* 9.9 14.5*  NEUTROABS 12.3*  --   --   HGB 11.3* 10.4* 10.3*  HCT 35.3* 32.5* 33.3*  MCV 77.4* 78.1 78.9  PLT 427* 435* 469*   Recent Labs Lab 10/24/16 2126  GLUCAP 102*   Medications: . dialysis solution 2.5% low-MG/low-CA    . piperacillin-tazobactam (ZOSYN)  IV Stopped (10/26/16 0057)   . ALPRAZolam  1 mg Oral BID  . amLODipine  10 mg Oral Daily  . aspirin EC  81 mg Oral Daily  . buPROPion  150 mg Oral Daily   And  . buPROPion  300 mg Oral QHS  . calcitRIOL  0.5 mcg Oral Daily  . calcium acetate  1,334 mg Oral TID WC  . cinacalcet  60 mg  Oral Q breakfast  . cloNIDine  0.2 mg Transdermal Weekly  . gentamicin cream  1 application Topical Daily  . heparin  5,000 Units Subcutaneous Q8H  . hydrALAZINE  100 mg Oral BID  . isosorbide dinitrate  5 mg Oral BID  . labetalol  150 mg Oral BID  . pantoprazole  40 mg Oral Daily  . PARoxetine  40 mg Oral Daily  . pravastatin  20 mg Oral Daily  . saccharomyces boulardii  250 mg Oral BID  . Vitamin D (Ergocalciferol)  50,000 Units Oral Q7 days  . zolpidem  5 mg Oral QHS    Dialysis Orders: 3L fill 5 exchanges at night with 5th fill manually drained 1-1/2 hours later  Assessment/Plan: 1. Right foot cellulitis with poor wound healing and appearance of PAD: Currently on Zosyn after initial treatment with vancomycin/Zosyn following failure of outpatient antibiotic therapy. Awaiting ABI and will determine decision for arteriogram on Monday by vascular surgery. 2. ESRD:Continue CCPD on current prescription. 3. 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 Nutrition:Alb 2.5, continue renal  diet, added supplements 6. Hypertension:BP still high, volume improved alternating 2.5% and 4.25% dextrose PD sol'n. Pain may be contributing. Increase labetalol to 200mg  BID.  Veneta Penton, PA-C 10/26/2016, 9:00 AM  [facility] Pager: [phone]

## 2016-10-26 NOTE — Progress Notes (Signed)
[facility]                                                                              Patient Demographics  Hayden [name], is a 46 y.o. male, DOB - 1970-11-07, FAO:[id number]  Admit date - 10/23/2016   Admitting Physician Norval Morton, MD  Outpatient Primary MD for the patient is Louis Meckel, MD  Outpatient specialists:   LOS - 2  days    Chief Complaint  Patient presents with  . Foot Injury       Brief summary  46 y.o. 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 The initial injury happened over one month ago when his foot caught in the track of the back seat of his sisters mini [name]. He initially tried to keep the area clean and washed. However, develop redness, swelling and pain. Approximately 2 weeks ago he was seen at the emergency department and was given 1 dose of vancomycin, but ended up leaving AMA. Patient saw his PCP who prescribed clindamycin, patient took for last 12-13 days however states that the pain continued to get worse and with the drainage from the wound.    Assessment & Plan    Principal Problem: Cellulitis of the right foot/Right great toe:  - failed treatment with outpatient antibiotics of clindamycin - Continue empiric antibiotics of vancomycin and Zosyn - Vascular surgery consulted, Recommended ABIs, Angiogram on Monday - I had discussed with Dr. Sharol Given, will be available on Monday to see the patient - Pain better controlled with the current regimen,continue fentanyl IV for breakthrough pain, increased oxycodone to 10 mg every 3 hours as needed  Active problems Hypertensive urgency: Blood pressures noted to be as high as 240/110 on admission. Patient notes not recently taking home blood pressure medications and therefore these were given while in the ED - Continue clonidine patch, hydralazine, labetalol  - BP better  controlled now   Leukocytosis: WBC elevated at 14.6. Suspect secondary to above. - Resolved, continue IV antibiotics  ESRD on peritoneal dialysis: Patient receives nightly peritoneal pulses dialysis. Initial BUN 102, creatinine 13.79. - Nephrology consulted for peritoneal dialysis - Continue Sensipar, calcitriol, and PhosLo  Hyperkalemia: Acute. Initial potassium 5.4 on admission. - Resolved  Elevated anion gap likely secondary to uremia. Suspect will improve following dialysis.  Elevated alkaline phosphatase -Improving  Anxiety and depression - Anxiety improving, continue Xanax  increased to twice a day  Prolonged QTC - Avoid QTC prolonging medications, discontinued Lexapro   Code Status: Full CODE STATUS DVT Prophylaxis:  Heparin subcutaneous Family Communication: Discussed in detail with the patient, all imaging results, lab results explained to the patient and wife   Disposition Plan:   Time Spent in minutes   15 minutes  Procedures:  Hemodialysis  Consultants:   Nephrology Vascular surgery  Antimicrobials:   Vancomycin with HD IV 4/25>  Zosyn IV 4/25>   Medications  Scheduled Meds: . ALPRAZolam  1 mg Oral BID  . amLODipine  10 mg Oral Daily  . aspirin EC  81 mg Oral  Daily  . buPROPion  150 mg Oral Daily   And  . buPROPion  300 mg Oral QHS  . calcitRIOL  0.5 mcg Oral Daily  . calcium acetate  1,334 mg Oral TID WC  . cinacalcet  60 mg Oral Q breakfast  . cloNIDine  0.2 mg Transdermal Weekly  . feeding supplement (NEPRO CARB STEADY)  237 mL Oral BID BM  . feeding supplement (PRO-STAT SUGAR FREE 64)  30 mL Oral BID  . gentamicin cream  1 application Topical Daily  . heparin  5,000 Units Subcutaneous Q8H  . hydrALAZINE  100 mg Oral BID  . isosorbide dinitrate  5 mg Oral BID  . labetalol  200 mg Oral BID  . pantoprazole  40 mg Oral Daily  . PARoxetine  40 mg Oral Daily  . pravastatin  20 mg Oral Daily  . saccharomyces boulardii  250 mg Oral  BID  . Vitamin D (Ergocalciferol)  50,000 Units Oral Q7 days  . zolpidem  5 mg Oral QHS   Continuous Infusions: . dialysis solution 2.5% low-MG/low-CA    . piperacillin-tazobactam (ZOSYN)  IV 3.375 g (10/26/16 1000)   PRN Meds:.acetaminophen **OR** acetaminophen, albuterol, fentaNYL (SUBLIMAZE) injection, heparin, hydrALAZINE, ondansetron **OR** ondansetron (ZOFRAN) IV, oxyCODONE   Antibiotics   Anti-infectives    Start     Dose/Rate Route Frequency Ordered Stop   10/24/16 0600  piperacillin-tazobactam (ZOSYN) IVPB 3.375 g     3.375 g 12.5 mL/hr over 240 Minutes Intravenous Every 12 hours 10/23/16 1854     10/23/16 1845  piperacillin-tazobactam (ZOSYN) IVPB 3.375 g     3.375 g 100 mL/hr over 30 Minutes Intravenous  Once 10/23/16 1837 10/23/16 1928   10/23/16 1845  vancomycin (VANCOCIN) 2,000 mg in sodium chloride 0.9 % 500 mL IVPB     2,000 mg 250 mL/hr over 120 Minutes Intravenous  Once 10/23/16 1842 10/23/16 2131        Subjective:   Hayden [name] was seen and examined today. No complaints, pain is better controlled today. Patient denies dizziness, chest pain, shortness of breath, abdominal pain, N/V/D/C, new weakness, numbess, tingling.   Objective:   Vitals:   10/25/16 2057 10/25/16 2058 10/26/16 0631 10/26/16 0900  BP: (!) 158/74 (!) 158/74 (!) 173/70   Pulse:  85 89   Resp:  18 18   Temp:  98.7 F (37.1 C) 98.4 F (36.9 C)   TempSrc:  Oral Oral   SpO2:  93% 96%   Weight:  95.3 kg (210 lb)  90.7 kg (199 lb 15.3 oz)  Height:  5\' 10"  (1.778 m)      Intake/Output Summary (Last 24 hours) at 10/26/16 1307 Last data filed at 10/26/16 0900  Gross per 24 hour  Intake            15514 ml  Output            18527 ml  Net            -3013 ml     Wt Readings from Last 3 Encounters:  10/26/16 90.7 kg (199 lb 15.3 oz)  07/12/16 99.8 kg (220 lb)  11/18/15 101.1 kg (222 lb 14.2 oz)     Exam  General: Alert and oriented x 3, NAD  HEENT:    Neck: Supple, no  JVD, no masses  Cardiovascular: S1 S2 Clear, RRR  Respiratory: Clear to auscultation bilaterally, no wheezing, rales or rhonchi  Gastrointestinal: Soft, nontender, nondistended, + bowel sounds, PD cath  in place  Ext: no cyanosis clubbing or edema  Neuro: no new deficits  Skin: Dry gangrenous appearing changes on the right great toe with multiple areas of eschar   Psych: Normal affect and demeanor, alert and oriented x3    Data Reviewed:  I have personally reviewed following labs and imaging studies  Micro Results Recent Results (from the past 240 hour(s))  Blood culture (routine x 2)     Status: None (Preliminary result)   Collection Time: 10/23/16  5:20 PM  Result Value Ref Range Status   Specimen Description BLOOD RIGHT ANTECUBITAL  Final   Special Requests   Final    BOTTLES DRAWN AEROBIC AND ANAEROBIC Blood Culture adequate volume   Culture NO GROWTH 2 DAYS  Final   Report Status PENDING  Incomplete  Blood culture (routine x 2)     Status: None (Preliminary result)   Collection Time: 10/23/16  6:26 PM  Result Value Ref Range Status   Specimen Description BLOOD RIGHT ANTECUBITAL  Final   Special Requests   Final    BOTTLES DRAWN AEROBIC AND ANAEROBIC Blood Culture adequate volume   Culture NO GROWTH 2 DAYS  Final   Report Status PENDING  Incomplete  MRSA PCR Screening     Status: None   Collection Time: 10/24/16  5:03 AM  Result Value Ref Range Status   MRSA by PCR NEGATIVE NEGATIVE Final    Comment:        The GeneXpert MRSA Assay (FDA approved for NASAL specimens only), is one component of a comprehensive MRSA colonization surveillance program. It is not intended to diagnose MRSA infection nor to guide or monitor treatment for MRSA infections.     Radiology Reports Dg Foot Complete Right  Result Date: 10/23/2016 CLINICAL DATA:  Recent foot injury 2 weeks ago with persistent pain and discoloration EXAM: RIGHT FOOT COMPLETE - 3+ VIEW COMPARISON:  None.  FINDINGS: No acute fracture or dislocation is noted. Diffuse vascular calcifications are seen. IMPRESSION: No acute abnormality noted. Electronically Signed   By: Inez Catalina M.D.   On: 10/23/2016 17:50    Lab Data:  CBC:  Recent Labs Lab 10/23/16 1720 10/24/16 0458 10/26/16 0324  WBC 14.6* 9.9 14.5*  NEUTROABS 12.3*  --   --   HGB 11.3* 10.4* 10.3*  HCT 35.3* 32.5* 33.3*  MCV 77.4* 78.1 78.9  PLT 427* 435* 379*   Basic Metabolic Panel:  Recent Labs Lab 10/23/16 1720 10/24/16 0458 10/26/16 0324  NA 137 138 138  K 5.4* 4.6 4.2  CL 98* 98* 98*  CO2 21* 21* 21*  GLUCOSE 106* 70 98  BUN 102* 96* 82*  CREATININE 13.79* 13.22* 12.15*  CALCIUM 7.7* 7.9* 7.3*   GFR: Estimated Creatinine Clearance: 8.8 mL/min (A) (by C-G formula based on SCr of 12.15 mg/dL (H)). Liver Function Tests:  Recent Labs Lab 10/23/16 1720 10/24/16 0458  AST 28 22  ALT 49 43  ALKPHOS 264* 243*  BILITOT 1.2 1.2  PROT 6.4* 6.0*  ALBUMIN 2.6* 2.5*   No results for input(s): LIPASE, AMYLASE in the last 168 hours. No results for input(s): AMMONIA in the last 168 hours. Coagulation Profile: No results for input(s): INR, PROTIME in the last 168 hours. Cardiac Enzymes: No results for input(s): CKTOTAL, CKMB, CKMBINDEX, TROPONINI in the last 168 hours. BNP (last 3 results) No results for input(s): PROBNP in the last 8760 hours. HbA1C: No results for input(s): HGBA1C in the last 72 hours. CBG:  Recent  Labs Lab 10/24/16 2126  GLUCAP 102*   Lipid Profile: No results for input(s): CHOL, HDL, LDLCALC, TRIG, CHOLHDL, LDLDIRECT in the last 72 hours. Thyroid Function Tests: No results for input(s): TSH, T4TOTAL, FREET4, T3FREE, THYROIDAB in the last 72 hours. Anemia Panel: No results for input(s): VITAMINB12, FOLATE, FERRITIN, TIBC, IRON, RETICCTPCT in the last 72 hours. Urine analysis:    Component Value Date/Time   COLORURINE AMBER (A) 11/04/2015 1930   APPEARANCEUR TURBID (A) 11/04/2015  1930   LABSPEC 1.019 11/04/2015 1930   PHURINE 5.5 11/04/2015 1930   GLUCOSEU 100 (A) 11/04/2015 1930   HGBUR NEGATIVE 11/04/2015 1930   BILIRUBINUR NEGATIVE 11/04/2015 1930   KETONESUR NEGATIVE 11/04/2015 1930   PROTEINUR >300 (A) 11/04/2015 1930   UROBILINOGEN 0.2 09/22/2012 1805   NITRITE NEGATIVE 11/04/2015 1930   LEUKOCYTESUR NEGATIVE 11/04/2015 1930     Kehinde Bowdish M.D. [facility] 10/26/2016, 1:07 PM  Pager: [phone] Between 7am to 7pm - call Pager - [phone]  After 7pm go to [email or web address] - password [email or web address]  Call night coverage person covering after 7pm

## 2016-10-26 NOTE — Progress Notes (Signed)
   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 The posterior tibial and peroneal arteries are patent. The anterior tibial artery on the right is occluded.  Based on these duplex findings, I have recommended we proceed with arteriography on Monday.  I have reviewed with the patient the indications for arteriography. In addition, I have reviewed the potential complications of arteriography including but not limited to: Bleeding, arterial injury, arterial thrombosis, dye action, renal insufficiency, or other unpredictable medical problems. I have explained to the patient that if we find disease amenable to angioplasty we could potentially address this at the same time. I have discussed the potential complications of angioplasty and stenting, including but not limited to: Bleeding, arterial thrombosis, arterial injury, dissection, or the need for surgical intervention.   SUBJECTIVE:   No complaints.   PHYSICAL EXAM:   Vitals:   10/25/16 1845 10/25/16 2057 10/25/16 2058 10/26/16 0631  BP: (!) 143/83 (!) 158/74 (!) 158/74 (!) 173/70  Pulse: 84  85 89  Resp:   18 18  Temp: 98.4 F (36.9 C)  98.7 F (37.1 C) 98.4 F (36.9 C)  TempSrc: Oral  Oral Oral  SpO2: 97%  93% 96%  Weight:   210 lb (95.3 kg)   Height:   5\' 10"  (1.778 m)    Wounds on the dorsum of the right foot are unchanged.   LABS:   Lab Results  Component Value Date   WBC 14.5 (H) 10/26/2016   HGB 10.3 (L) 10/26/2016   HCT 33.3 (L) 10/26/2016   MCV 78.9 10/26/2016   PLT 469 (H) 10/26/2016   Lab Results  Component Value Date   CREATININE 12.15 (H) 10/26/2016   Lab Results  Component Value Date   INR 1.36 11/04/2015   CBG (last 3)   Recent Labs  10/24/16 2126  GLUCAP 102*     PROBLEM LIST:    Principal Problem:   Cellulitis of great toe of right foot Active Problems:   Hypertensive urgency, malignant   End stage renal disease on dialysis (HCC)   Anxiety and depression   Leukocytosis   Hyperkalemia   Pressure injury of skin   CURRENT MEDS:   . ALPRAZolam  1 mg Oral BID  . amLODipine  10 mg Oral Daily  . aspirin EC  81 mg Oral Daily  . buPROPion  150 mg Oral Daily   And  . buPROPion  300 mg Oral QHS  . calcitRIOL  0.5 mcg Oral Daily  . calcium acetate  1,334 mg Oral TID WC  . cinacalcet  60 mg Oral Q breakfast  . cloNIDine  0.2 mg Transdermal Weekly  . gentamicin cream  1 application Topical Daily  . heparin  5,000 Units Subcutaneous Q8H  . hydrALAZINE  100 mg Oral BID  . isosorbide dinitrate  5 mg Oral BID  . labetalol  150 mg Oral BID  . pantoprazole  40 mg Oral Daily  . PARoxetine  40 mg Oral Daily  . pravastatin  20 mg Oral Daily  . saccharomyces boulardii  250 mg Oral BID  . Vitamin D (Ergocalciferol)  50,000 Units Oral Q7 days  . zolpidem  5 mg Oral QHS    Gae Gallop Beeper: [phone] Office: [phone] 10/26/2016

## 2016-10-26 NOTE — Progress Notes (Signed)
Pt very unsteady when ambulating. Pt refuses to call staff for help and refuses to have the bed alarm on. Pt kept taking tele monitor off throughout the night.   Eleanora Neighbor, RN

## 2016-10-26 NOTE — Progress Notes (Signed)
Pharmacy Antibiotic Note Hayden [name] is a 46 y.o. male admitted on 10/23/2016 with right foot wound/cellulitis that failed to respond to out dose of Dalvance 1500mg  IV on 10/10/16 and PO clindamycin. History of ESRD on PD at home. Pharmacy consulted to dose Zosyn and vancomycin.    Random vancomycin level is above goal at 28 this morning following a 2000 mg load on 4/25.  Plan: 1. Continue Zosyn 3.375 grams IV every 12 hours (infused over 4 hours) 2. Repeat random vancomycin level in am and re-dose when level < 20 3. Monitor clinical progress and culture data   Height: 5\' 10"  (177.8 cm) Weight: 199 lb 15.3 oz (90.7 kg) IBW/kg (Calculated) : 73  Temp (24hrs), Avg:98.5 F (36.9 C), Min:98.4 F (36.9 C), Max:98.7 F (37.1 C)   Recent Labs Lab 10/23/16 1720 10/23/16 1726 10/23/16 2108 10/24/16 0458 10/26/16 0324  WBC 14.6*  --   --  9.9 14.5*  CREATININE 13.79*  --   --  13.22* 12.15*  LATICACIDVEN  --  0.73 0.45*  --   --   VANCORANDOM  --   --   --   --  28    Estimated Creatinine Clearance: 8.8 mL/min (A) (by C-G formula based on SCr of 12.15 mg/dL (H)).    Allergies  Allergen Reactions  . Dilaudid [Hydromorphone Hcl] Other (See Comments)    Abnormal behavior, "verbal and physically abusive", "psychosis"  . Morphine And Related Itching    Syncopal episode    Antimicrobials this admission: Zosyn 4/25 >> vancomycin 4/25 >>   Dalvance 1500mg  IV once 10/10/16 at [place] [facility]   Microbiology results: 4/25 BCx: px 4/26 MRSA PCR neg   Thank you for allowing pharmacy to be a part of this patient's care.  Renold Genta, PharmD, BCPS Clinical Pharmacist Phone for today - [place] - [phone] 10/26/2016 12:35 PM

## 2016-10-27 LAB — CBC
HEMATOCRIT: 31.5 % — AB (ref 39.0–52.0)
Hemoglobin: 9.8 g/dL — ABNORMAL LOW (ref 13.0–17.0)
MCH: 24.5 pg — AB (ref 26.0–34.0)
MCHC: 31.1 g/dL (ref 30.0–36.0)
MCV: 78.8 fL (ref 78.0–100.0)
Platelets: 501 10*3/uL — ABNORMAL HIGH (ref 150–400)
RBC: 4 MIL/uL — ABNORMAL LOW (ref 4.22–5.81)
RDW: 18.8 % — AB (ref 11.5–15.5)
WBC: 14.4 10*3/uL — ABNORMAL HIGH (ref 4.0–10.5)

## 2016-10-27 LAB — BASIC METABOLIC PANEL
Anion gap: 19 — ABNORMAL HIGH (ref 5–15)
BUN: 71 mg/dL — AB (ref 6–20)
CO2: 25 mmol/L (ref 22–32)
Calcium: 7.3 mg/dL — ABNORMAL LOW (ref 8.9–10.3)
Chloride: 96 mmol/L — ABNORMAL LOW (ref 101–111)
Creatinine, Ser: 11.88 mg/dL — ABNORMAL HIGH (ref 0.61–1.24)
GFR calc Af Amer: 5 mL/min — ABNORMAL LOW (ref >60)
GFR, EST NON AFRICAN AMERICAN: 5 mL/min — AB (ref >60)
GLUCOSE: 126 mg/dL — AB (ref 65–99)
POTASSIUM: 3.8 mmol/L (ref 3.5–5.1)
Sodium: 140 mmol/L (ref 135–145)

## 2016-10-27 LAB — VANCOMYCIN, RANDOM: Vancomycin Rm: 26

## 2016-10-27 MED ORDER — DARBEPOETIN ALFA 60 MCG/0.3ML IJ SOSY
60.0000 ug | PREFILLED_SYRINGE | INTRAMUSCULAR | Status: DC
  Administered 2016-10-27: 60 ug via SUBCUTANEOUS
  Filled 2016-10-27: qty 0.3

## 2016-10-27 NOTE — Progress Notes (Signed)
   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 The posterior tibial and peroneal arteries are patent. The anterior tibial artery on the right is occluded.Therefore, I have recommended we proceed with arteriography on Monday. He is agreeable. The procedure and risks were discussed with the patient.   SUBJECTIVE:   Getting peritoneal dialysis  PHYSICAL EXAM:   Vitals:   10/26/16 1613 10/26/16 2010 10/26/16 2120 10/27/16 0500  BP: 130/71 (!) 158/74 (!) 153/64 (!) 122/55  Pulse: 85 79 83 77  Resp: 18 16 16 18   Temp: 98.2 F (36.8 C) 98.9 F (37.2 C) 98.5 F (36.9 C) 98.4 F (36.9 C)  TempSrc: Oral  Oral Oral  SpO2: 93%  95% 94%  Weight:  198 lb 13.7 oz (90.2 kg) 198 lb 13.7 oz (90.2 kg)   Height:       No change in exam.  LABS:   Lab Results  Component Value Date   WBC 14.4 (H) 10/27/2016   HGB 9.8 (L) 10/27/2016   HCT 31.5 (L) 10/27/2016   MCV 78.8 10/27/2016   PLT 501 (H) 10/27/2016   Lab Results  Component Value Date   CREATININE 11.88 (H) 10/27/2016   Lab Results  Component Value Date   INR 1.36 11/04/2015   CBG (last 3)   Recent Labs  10/24/16 2126  GLUCAP 102*    PROBLEM LIST:    Principal Problem:   Cellulitis of great toe of right foot Active Problems:   Hypertensive urgency, malignant   End stage renal disease on dialysis (HCC)   Anxiety and depression   Leukocytosis   Hyperkalemia   Pressure injury of skin   CURRENT MEDS:   . ALPRAZolam  1 mg Oral BID  . amLODipine  10 mg Oral Daily  . aspirin EC  81 mg Oral Daily  . buPROPion  150 mg Oral Daily   And  . buPROPion  300 mg Oral QHS  . calcitRIOL  0.5 mcg Oral Daily  . calcium acetate  1,334 mg Oral TID WC  . cinacalcet  60 mg Oral Q breakfast  .  cloNIDine  0.2 mg Transdermal Weekly  . feeding supplement (NEPRO CARB STEADY)  237 mL Oral BID BM  . feeding supplement (PRO-STAT SUGAR FREE 64)  30 mL Oral BID  . gentamicin cream  1 application Topical Daily  . heparin  5,000 Units Subcutaneous Q8H  . hydrALAZINE  100 mg Oral BID  . isosorbide dinitrate  5 mg Oral BID  . labetalol  200 mg Oral BID  . pantoprazole  40 mg Oral Daily  . PARoxetine  40 mg Oral Daily  . pravastatin  20 mg Oral Daily  . saccharomyces boulardii  250 mg Oral BID  . Vitamin D (Ergocalciferol)  50,000 Units Oral Q7 days  . zolpidem  5 mg Oral QHS    Gae Gallop Beeper: [phone] Office: [phone] 10/27/2016

## 2016-10-27 NOTE — Progress Notes (Signed)
[facility]                                                                              Patient Demographics  Hayden [name], is a 46 y.o. male, DOB - July 11, 1970, ZJI:[id number]  Admit date - 10/23/2016   Admitting Physician Norval Morton, MD  Outpatient Primary MD for the patient is Louis Meckel, MD  Outpatient specialists:   LOS - 3  days    Chief Complaint  Patient presents with  . Foot Injury       Brief summary  46 y.o. 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 The initial injury happened over one month ago when his foot caught in the track of the back seat of his sisters mini [name]. He initially tried to keep the area clean and washed. However, develop redness, swelling and pain. Approximately 2 weeks ago he was seen at the emergency department and was given 1 dose of vancomycin, but ended up leaving AMA. Patient saw his PCP who prescribed clindamycin, patient took for last 12-13 days however states that the pain continued to get worse and with the drainage from the wound.    Assessment & Plan    Principal Problem: Cellulitis of the right foot/Right great toe:  - failed treatment with outpatient antibiotics of clindamycin - Continue empiric antibiotics of vancomycin and Zosyn - Vascular surgery consulted, Recommended ABIs, Angiogram on Monday - I had discussed with Dr. Sharol Given, will be available on Monday to see the patient - Pain better controlled with the current regimen,continue fentanyl IV for breakthrough pain, increased oxycodone to 10 mg every 3 hours as needed  Active problems Hypertensive urgency: Blood pressures noted to be as high as 240/110 on admission. Patient notes not recently taking home blood pressure medications and therefore these were given while in the ED - Continue clonidine patch, hydralazine, labetalol  - BP better  controlled now   Leukocytosis: WBC elevated at 14.6. Suspect secondary to above. - continue IV antibiotics  ESRD on peritoneal dialysis: Patient receives nightly peritoneal pulses dialysis. Initial BUN 102, creatinine 13.79. - Nephrology consulted for peritoneal dialysis - Continue Sensipar, calcitriol, and PhosLo  Hyperkalemia: Acute. Initial potassium 5.4 on admission. - Resolved  Elevated anion gap likely secondary to uremia. Suspect will improve following dialysis.  Elevated alkaline phosphatase - recheck LFTs in a.m.  Anxiety and depression - Anxiety improving, continue Xanax  increased to twice a day  Prolonged QTC - Avoid QTC prolonging medications, discontinued Lexapro   Code Status: Full CODE STATUS DVT Prophylaxis:  Heparin subcutaneous Family Communication: Discussed in detail with the patient, all imaging results, lab results explained to the patient and wife at the bedside   Disposition Plan: No changes today, awaiting angiogram in a.m.  Time Spent in minutes   15 minutes  Procedures:  Hemodialysis  Consultants:   Nephrology Vascular surgery  Antimicrobials:   Vancomycin with HD IV 4/25>  Zosyn IV 4/25>   Medications  Scheduled Meds: . ALPRAZolam  1 mg Oral BID  . amLODipine  10 mg Oral Daily  . aspirin EC  81 mg Oral Daily  . buPROPion  150 mg Oral Daily   And  . buPROPion  300 mg Oral QHS  . calcitRIOL  0.5 mcg Oral Daily  . calcium acetate  1,334 mg Oral TID WC  . cinacalcet  60 mg Oral Q breakfast  . cloNIDine  0.2 mg Transdermal Weekly  . darbepoetin (ARANESP) injection - NON-DIALYSIS  60 mcg Subcutaneous Q Sun-1800  . feeding supplement (NEPRO CARB STEADY)  237 mL Oral BID BM  . feeding supplement (PRO-STAT SUGAR FREE 64)  30 mL Oral BID  . gentamicin cream  1 application Topical Daily  . heparin  5,000 Units Subcutaneous Q8H  . hydrALAZINE  100 mg Oral BID  . isosorbide dinitrate  5 mg Oral BID  . labetalol  200 mg Oral BID   . pantoprazole  40 mg Oral Daily  . PARoxetine  40 mg Oral Daily  . pravastatin  20 mg Oral Daily  . saccharomyces boulardii  250 mg Oral BID  . Vitamin D (Ergocalciferol)  50,000 Units Oral Q7 days  . zolpidem  5 mg Oral QHS   Continuous Infusions: . dialysis solution 2.5% low-MG/low-CA    . piperacillin-tazobactam (ZOSYN)  IV 3.375 g (10/27/16 1102)   PRN Meds:.acetaminophen **OR** acetaminophen, albuterol, fentaNYL (SUBLIMAZE) injection, heparin, hydrALAZINE, ondansetron **OR** ondansetron (ZOFRAN) IV, oxyCODONE   Antibiotics   Anti-infectives    Start     Dose/Rate Route Frequency Ordered Stop   10/24/16 0600  piperacillin-tazobactam (ZOSYN) IVPB 3.375 g     3.375 g 12.5 mL/hr over 240 Minutes Intravenous Every 12 hours 10/23/16 1854     10/23/16 1845  piperacillin-tazobactam (ZOSYN) IVPB 3.375 g     3.375 g 100 mL/hr over 30 Minutes Intravenous  Once 10/23/16 1837 10/23/16 1928   10/23/16 1845  vancomycin (VANCOCIN) 2,000 mg in sodium chloride 0.9 % 500 mL IVPB     2,000 mg 250 mL/hr over 120 Minutes Intravenous  Once 10/23/16 1842 10/23/16 2131        Subjective:   Hayden [name] was seen and examined today. No complaints. Patient denies dizziness, chest pain, shortness of breath, abdominal pain, N/V/D/C, new weakness, numbess, tingling.   Objective:   Vitals:   10/26/16 1613 10/26/16 2010 10/26/16 2120 10/27/16 0500  BP: 130/71 (!) 158/74 (!) 153/64 (!) 122/55  Pulse: 85 79 83 77  Resp: 18 16 16 18   Temp: 98.2 F (36.8 C) 98.9 F (37.2 C) 98.5 F (36.9 C) 98.4 F (36.9 C)  TempSrc: Oral  Oral Oral  SpO2: 93%  95% 94%  Weight:  90.2 kg (198 lb 13.7 oz) 90.2 kg (198 lb 13.7 oz)   Height:        Intake/Output Summary (Last 24 hours) at 10/27/16 1223 Last data filed at 10/27/16 1155  Gross per 24 hour  Intake              770 ml  Output                0 ml  Net              770 ml     Wt Readings from Last 3 Encounters:  10/26/16 90.2 kg (198 lb  13.7 oz)  07/12/16 99.8 kg (220 lb)  11/18/15 101.1 kg (222 lb 14.2 oz)     Exam  General: Alert and oriented x 3, NAD  HEENT:  Neck: Supple, no JVD  Cardiovascular: S1 S2 Clear, RRR  Respiratory: Clear to auscultation bilaterally, no wheezing, rales or rhonchi  Gastrointestinal: Soft, nontender, nondistended, + bowel sounds, PD cath in place  Ext: no cyanosis clubbing or edema  Neuro: no new deficits  Skin: Dry gangrenous appearing changes on the right great toe with multiple areas of eschar   Psych: Normal affect and demeanor, alert and oriented x3    Data Reviewed:  I have personally reviewed following labs and imaging studies  Micro Results Recent Results (from the past 240 hour(s))  Blood culture (routine x 2)     Status: None (Preliminary result)   Collection Time: 10/23/16  5:20 PM  Result Value Ref Range Status   Specimen Description BLOOD RIGHT ANTECUBITAL  Final   Special Requests   Final    BOTTLES DRAWN AEROBIC AND ANAEROBIC Blood Culture adequate volume   Culture NO GROWTH 3 DAYS  Final   Report Status PENDING  Incomplete  Blood culture (routine x 2)     Status: None (Preliminary result)   Collection Time: 10/23/16  6:26 PM  Result Value Ref Range Status   Specimen Description BLOOD RIGHT ANTECUBITAL  Final   Special Requests   Final    BOTTLES DRAWN AEROBIC AND ANAEROBIC Blood Culture adequate volume   Culture NO GROWTH 3 DAYS  Final   Report Status PENDING  Incomplete  MRSA PCR Screening     Status: None   Collection Time: 10/24/16  5:03 AM  Result Value Ref Range Status   MRSA by PCR NEGATIVE NEGATIVE Final    Comment:        The GeneXpert MRSA Assay (FDA approved for NASAL specimens only), is one component of a comprehensive MRSA colonization surveillance program. It is not intended to diagnose MRSA infection nor to guide or monitor treatment for MRSA infections.     Radiology Reports Dg Foot Complete Right  Result Date:  10/23/2016 CLINICAL DATA:  Recent foot injury 2 weeks ago with persistent pain and discoloration EXAM: RIGHT FOOT COMPLETE - 3+ VIEW COMPARISON:  None. FINDINGS: No acute fracture or dislocation is noted. Diffuse vascular calcifications are seen. IMPRESSION: No acute abnormality noted. Electronically Signed   By: Inez Catalina M.D.   On: 10/23/2016 17:50    Lab Data:  CBC:  Recent Labs Lab 10/23/16 1720 10/24/16 0458 10/26/16 0324 10/27/16 0454  WBC 14.6* 9.9 14.5* 14.4*  NEUTROABS 12.3*  --   --   --   HGB 11.3* 10.4* 10.3* 9.8*  HCT 35.3* 32.5* 33.3* 31.5*  MCV 77.4* 78.1 78.9 78.8  PLT 427* 435* 469* 703*   Basic Metabolic Panel:  Recent Labs Lab 10/23/16 1720 10/24/16 0458 10/26/16 0324 10/27/16 0454  NA 137 138 138 140  K 5.4* 4.6 4.2 3.8  CL 98* 98* 98* 96*  CO2 21* 21* 21* 25  GLUCOSE 106* 70 98 126*  BUN 102* 96* 82* 71*  CREATININE 13.79* 13.22* 12.15* 11.88*  CALCIUM 7.7* 7.9* 7.3* 7.3*   GFR: Estimated Creatinine Clearance: 9 mL/min (A) (by C-G formula based on SCr of 11.88 mg/dL (H)). Liver Function Tests:  Recent Labs Lab 10/23/16 1720 10/24/16 0458  AST 28 22  ALT 49 43  ALKPHOS 264* 243*  BILITOT 1.2 1.2  PROT 6.4* 6.0*  ALBUMIN 2.6* 2.5*   No results for input(s): LIPASE, AMYLASE in the last 168 hours. No results for input(s): AMMONIA in the last 168 hours. Coagulation Profile: No results for  input(s): INR, PROTIME in the last 168 hours. Cardiac Enzymes: No results for input(s): CKTOTAL, CKMB, CKMBINDEX, TROPONINI in the last 168 hours. BNP (last 3 results) No results for input(s): PROBNP in the last 8760 hours. HbA1C: No results for input(s): HGBA1C in the last 72 hours. CBG:  Recent Labs Lab 10/24/16 2126  GLUCAP 102*   Lipid Profile: No results for input(s): CHOL, HDL, LDLCALC, TRIG, CHOLHDL, LDLDIRECT in the last 72 hours. Thyroid Function Tests: No results for input(s): TSH, T4TOTAL, FREET4, T3FREE, THYROIDAB in the last 72  hours. Anemia Panel: No results for input(s): VITAMINB12, FOLATE, FERRITIN, TIBC, IRON, RETICCTPCT in the last 72 hours. Urine analysis:    Component Value Date/Time   COLORURINE AMBER (A) 11/04/2015 1930   APPEARANCEUR TURBID (A) 11/04/2015 1930   LABSPEC 1.019 11/04/2015 1930   PHURINE 5.5 11/04/2015 1930   GLUCOSEU 100 (A) 11/04/2015 1930   HGBUR NEGATIVE 11/04/2015 1930   BILIRUBINUR NEGATIVE 11/04/2015 1930   KETONESUR NEGATIVE 11/04/2015 1930   PROTEINUR >300 (A) 11/04/2015 1930   UROBILINOGEN 0.2 09/22/2012 1805   NITRITE NEGATIVE 11/04/2015 1930   LEUKOCYTESUR NEGATIVE 11/04/2015 1930     Ripudeep Rai M.D. [facility] 10/27/2016, 12:23 PM  Pager: [phone] Between 7am to 7pm - call Pager - [phone]  After 7pm go to [email or web address] - password [email or web address]  Call night coverage person covering after 7pm

## 2016-10-27 NOTE — Progress Notes (Signed)
Pharmacy Antibiotic Note LENG MONTESDEOCA is a 46 y.o. male admitted on 10/23/2016 with right foot wound/cellulitis that failed to respond to out dose of Dalvance 1500mg  IV on 10/10/16 and PO clindamycin. History of ESRD on PD at home. Pharmacy consulted to dose Zosyn and vancomycin.    Random vancomycin level remains above goal at 26 this morning following a 2000 mg load on 4/25.  Plan: 1. Continue Zosyn 3.375 grams IV every 12 hours (infused over 4 hours) 2. Repeat random vancomycin level in 48 hrs and re-dose when level < 20 3. Monitor clinical progress and culture data   Height: 5\' 10"  (177.8 cm) Weight: 198 lb 13.7 oz (90.2 kg) IBW/kg (Calculated) : 73  Temp (24hrs), Avg:98.5 F (36.9 C), Min:98.2 F (36.8 C), Max:98.9 F (37.2 C)   Recent Labs Lab 10/23/16 1720 10/23/16 1726 10/23/16 2108 10/24/16 0458 10/26/16 0324 10/27/16 0454  WBC 14.6*  --   --  9.9 14.5* 14.4*  CREATININE 13.79*  --   --  13.22* 12.15* 11.88*  LATICACIDVEN  --  0.73 0.45*  --   --   --   VANCORANDOM  --   --   --   --  28 26    Estimated Creatinine Clearance: 9 mL/min (A) (by C-G formula based on SCr of 11.88 mg/dL (H)).    Allergies  Allergen Reactions  . Dilaudid [Hydromorphone Hcl] Other (See Comments)    Abnormal behavior, "verbal and physically abusive", "psychosis"  . Morphine And Related Itching    Syncopal episode    Antimicrobials this admission: Zosyn 4/25 >> vancomycin 4/25 >>   Dalvance 1500mg  IV once 10/10/16 at [facility]   Microbiology results: 4/25 BCx: ngtd 4/26 MRSA PCR neg   Thank you for allowing pharmacy to be a part of this patient's care.  Renold Genta, PharmD, BCPS Clinical Pharmacist Phone for today - [place] - [phone] 10/27/2016 12:19 PM

## 2016-10-27 NOTE — Progress Notes (Signed)
[facility] Progress Note   Subjective:  Seen in room, just finished CCPD without issues. No CP, dyspnea, abdominal pain. Toe pain is better controlled think morning, but a little out of it compared to yesterday (likely med effect). Plan is for RLE angiogram tomorrow at 7am.  Objective Vitals:   10/26/16 1613 10/26/16 2010 10/26/16 2120 10/27/16 0500  BP: 130/71 (!) 158/74 (!) 153/64 (!) 122/55  Pulse: 85 79 83 77  Resp: 18 16 16 18   Temp: 98.2 F (36.8 C) 98.9 F (37.2 C) 98.5 F (36.9 C) 98.4 F (36.9 C)  TempSrc: Oral  Oral Oral  SpO2: 93%  95% 94%  Weight:  90.2 kg (198 lb 13.7 oz) 90.2 kg (198 lb 13.7 oz)   Height:       Physical Exam General: Loopy appearing male, NAD. Heart: RRR; 2/6 systolic murmur. Lungs: CTAB Abdomen: soft, PD cath in place without erythema. Extremities: No LE edema, R great toe with necrotic wound present Dialysis Access: PD cath. L forearm AVF + thrill.  Additional Objective Labs: Basic Metabolic Panel:  Recent Labs Lab 10/24/16 0458 10/26/16 0324 10/27/16 0454  NA 138 138 140  K 4.6 4.2 3.8  CL 98* 98* 96*  CO2 21* 21* 25  GLUCOSE 70 98 126*  BUN 96* 82* 71*  CREATININE 13.22* 12.15* 11.88*  CALCIUM 7.9* 7.3* 7.3*   Liver Function Tests:  Recent Labs Lab 10/23/16 1720 10/24/16 0458  AST 28 22  ALT 49 43  ALKPHOS 264* 243*  BILITOT 1.2 1.2  PROT 6.4* 6.0*  ALBUMIN 2.6* 2.5*   CBC:  Recent Labs Lab 10/23/16 1720 10/24/16 0458 10/26/16 0324 10/27/16 0454  WBC 14.6* 9.9 14.5* 14.4*  NEUTROABS 12.3*  --   --   --   HGB 11.3* 10.4* 10.3* 9.8*  HCT 35.3* 32.5* 33.3* 31.5*  MCV 77.4* 78.1 78.9 78.8  PLT 427* 435* 469* 501*   CBG:  Recent Labs Lab 10/24/16 2126  GLUCAP 102*   Medications: . dialysis solution 2.5% low-MG/low-CA    . piperacillin-tazobactam (ZOSYN)  IV Stopped (10/27/16 8891)   . ALPRAZolam  1 mg Oral BID  . amLODipine  10 mg Oral Daily  . aspirin EC  81 mg Oral Daily  .  buPROPion  150 mg Oral Daily   And  . buPROPion  300 mg Oral QHS  . calcitRIOL  0.5 mcg Oral Daily  . calcium acetate  1,334 mg Oral TID WC  . cinacalcet  60 mg Oral Q breakfast  . cloNIDine  0.2 mg Transdermal Weekly  . feeding supplement (NEPRO CARB STEADY)  237 mL Oral BID BM  . feeding supplement (PRO-STAT SUGAR FREE 64)  30 mL Oral BID  . gentamicin cream  1 application Topical Daily  . heparin  5,000 Units Subcutaneous Q8H  . hydrALAZINE  100 mg Oral BID  . isosorbide dinitrate  5 mg Oral BID  . labetalol  200 mg Oral BID  . pantoprazole  40 mg Oral Daily  . PARoxetine  40 mg Oral Daily  . pravastatin  20 mg Oral Daily  . saccharomyces boulardii  250 mg Oral BID  . Vitamin D (Ergocalciferol)  50,000 Units Oral Q7 days  . zolpidem  5 mg Oral QHS    Dialysis Orders: CCPD: 3L fill 5 exchanges at night with 5th fill manually drained 1-1/2 hours later  Assessment/Plan: 1. Right foot cellulitis/ significant PAD: Currently on Vanc/Zosyn following failure of outpatient antibiotic therapy. ABI/ultrasound  consistent with significant bilateral LE PAD, for RLE angiogram/plasty on 10/28/16. 2. ESRD:Continue CCPDon current prescription. Will try to start his PD a little earlier tonight so he will be ready for procedure tomorrow. 3. Anemia:Hgb 9.8, will start Aranesp 71mcg weekly. 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 Nutrition:Alb 2.5, continue renal diet/supplements 6. Hypertension:Labetalol increased to 200mg  BID on 4/28. BP much improved, volume stable alternating 2.5% and 4.25% dextrose PD sol'n. Will continue same regimen, may need to cut back to ALL 2.5% solution soon.  Veneta Penton, PA-C 10/27/2016, 9:25 AM  [facility] Pager: [phone]

## 2016-10-28 ENCOUNTER — Encounter (HOSPITAL_COMMUNITY)
Admission: EM | Disposition: A | Discharge status: Rehabilitation Facility | Payer: Self-pay | Source: Home / Self Care | Attending: Internal Medicine

## 2016-10-28 ENCOUNTER — Inpatient Hospital Stay (HOSPITAL_COMMUNITY): Payer: Medicare Other

## 2016-10-28 ENCOUNTER — Encounter (HOSPITAL_COMMUNITY): Payer: Self-pay | Admitting: Vascular Surgery

## 2016-10-28 DIAGNOSIS — L03031 Cellulitis of right toe: Secondary | ICD-10-CM

## 2016-10-28 DIAGNOSIS — I96 Gangrene, not elsewhere classified: Secondary | ICD-10-CM

## 2016-10-28 DIAGNOSIS — Z01810 Encounter for preprocedural cardiovascular examination: Secondary | ICD-10-CM

## 2016-10-28 HISTORY — PX: ABDOMINAL AORTOGRAM W/LOWER EXTREMITY: CATH118223

## 2016-10-28 LAB — CULTURE, BLOOD (ROUTINE X 2)
CULTURE: NO GROWTH
Culture: NO GROWTH
SPECIAL REQUESTS: ADEQUATE
SPECIAL REQUESTS: ADEQUATE

## 2016-10-28 LAB — HEPATIC FUNCTION PANEL
ALK PHOS: 208 U/L — AB (ref 38–126)
ALT: 30 U/L (ref 17–63)
AST: 22 U/L (ref 15–41)
Albumin: 2 g/dL — ABNORMAL LOW (ref 3.5–5.0)
BILIRUBIN TOTAL: 0.7 mg/dL (ref 0.3–1.2)
Total Protein: 5.8 g/dL — ABNORMAL LOW (ref 6.5–8.1)

## 2016-10-28 LAB — CBC
HEMATOCRIT: 31.5 % — AB (ref 39.0–52.0)
HEMOGLOBIN: 10.1 g/dL — AB (ref 13.0–17.0)
MCH: 25.5 pg — AB (ref 26.0–34.0)
MCHC: 32.1 g/dL (ref 30.0–36.0)
MCV: 79.5 fL (ref 78.0–100.0)
Platelets: 515 10*3/uL — ABNORMAL HIGH (ref 150–400)
RBC: 3.96 MIL/uL — ABNORMAL LOW (ref 4.22–5.81)
RDW: 19.3 % — ABNORMAL HIGH (ref 11.5–15.5)
WBC: 14.2 10*3/uL — ABNORMAL HIGH (ref 4.0–10.5)

## 2016-10-28 LAB — BASIC METABOLIC PANEL
ANION GAP: 15 (ref 5–15)
BUN: 62 mg/dL — ABNORMAL HIGH (ref 6–20)
CO2: 27 mmol/L (ref 22–32)
Calcium: 7 mg/dL — ABNORMAL LOW (ref 8.9–10.3)
Chloride: 98 mmol/L — ABNORMAL LOW (ref 101–111)
Creatinine, Ser: 11.88 mg/dL — ABNORMAL HIGH (ref 0.61–1.24)
GFR calc Af Amer: 5 mL/min — ABNORMAL LOW (ref >60)
GFR calc non Af Amer: 5 mL/min — ABNORMAL LOW (ref >60)
GLUCOSE: 102 mg/dL — AB (ref 65–99)
POTASSIUM: 3.7 mmol/L (ref 3.5–5.1)
Sodium: 140 mmol/L (ref 135–145)

## 2016-10-28 SURGERY — ABDOMINAL AORTOGRAM W/LOWER EXTREMITY
Anesthesia: LOCAL

## 2016-10-28 MED ORDER — LIDOCAINE HCL (PF) 1 % IJ SOLN
INTRAMUSCULAR | Status: DC | PRN
  Administered 2016-10-28: 15 mL

## 2016-10-28 MED ORDER — MIDAZOLAM HCL 2 MG/2ML IJ SOLN
INTRAMUSCULAR | Status: AC
  Filled 2016-10-28: qty 2

## 2016-10-28 MED ORDER — CINACALCET HCL 30 MG PO TABS: 60.0000 mg | ORAL_TABLET | Freq: Every day | ORAL | Status: DC

## 2016-10-28 MED ORDER — MIDAZOLAM HCL 2 MG/2ML IJ SOLN
INTRAMUSCULAR | Status: DC | PRN
  Administered 2016-10-28: 1 mg via INTRAVENOUS

## 2016-10-28 MED ORDER — HEPARIN 1000 UNIT/ML FOR PERITONEAL DIALYSIS
INTRAPERITONEAL | Status: DC | PRN
  Filled 2016-10-28: qty 5000

## 2016-10-28 MED ORDER — HEPARIN (PORCINE) IN NACL 2-0.9 UNIT/ML-% IJ SOLN
INTRAMUSCULAR | Status: DC | PRN
  Administered 2016-10-28: 1000 mL

## 2016-10-28 MED ORDER — HEPARIN (PORCINE) IN NACL 2-0.9 UNIT/ML-% IJ SOLN
INTRAMUSCULAR | Status: AC
  Filled 2016-10-28: qty 1000

## 2016-10-28 MED ORDER — FENTANYL CITRATE (PF) 100 MCG/2ML IJ SOLN
INTRAMUSCULAR | Status: DC | PRN
  Administered 2016-10-28: 50 ug via INTRAVENOUS

## 2016-10-28 MED ORDER — FENTANYL CITRATE (PF) 100 MCG/2ML IJ SOLN
INTRAMUSCULAR | Status: AC
  Filled 2016-10-28: qty 2

## 2016-10-28 MED ORDER — IODIXANOL 320 MG/ML IV SOLN
INTRAVENOUS | Status: DC | PRN
  Administered 2016-10-28: 175 mL via INTRA_ARTERIAL

## 2016-10-28 MED ORDER — LIDOCAINE HCL 1 % IJ SOLN
INTRAMUSCULAR | Status: AC
  Filled 2016-10-28: qty 20

## 2016-10-28 SURGICAL SUPPLY — 11 items
CATH ANGIO 5F PIGTAIL 65CM (CATHETERS) ×1 IMPLANT
CATH CROSS OVER TEMPO 5F (CATHETERS) ×1 IMPLANT
CATH STRAIGHT 5FR 65CM (CATHETERS) ×1 IMPLANT
GUIDEWIRE ANGLED .035X150CM (WIRE) ×1 IMPLANT
KIT MICROINTRODUCER STIFF 5F (SHEATH) ×1 IMPLANT
KIT PV (KITS) ×2 IMPLANT
SHEATH PINNACLE 5F 10CM (SHEATH) ×1 IMPLANT
SYR MEDRAD MARK V 150ML (SYRINGE) ×2 IMPLANT
TRANSDUCER W/STOPCOCK (MISCELLANEOUS) ×2 IMPLANT
TRAY PV CATH (CUSTOM PROCEDURE TRAY) ×2 IMPLANT
WIRE HITORQ VERSACORE ST 145CM (WIRE) ×1 IMPLANT

## 2016-10-28 NOTE — Progress Notes (Signed)
Lower Extremity Vein Map    Right Great Saphenous Vein   Segment Diameter Comment  1. Origin 6.28mm Branch  2. High Thigh 4.28mm   3. Mid Thigh 4.67mm   4. Low Thigh 3.57mm   5. At Knee 1.45mm Branch  6. High Calf 3.22mm   7. Low Calf 1.2mm Branch  8. Ankle 1.81mm Branch                Right Small Saphenous Vein Unable to visualize   Left Great Saphenous Vein   Segment Diameter Comment  1. Origin 6.43mm   2. High Thigh 6.57mm Branch  3. Mid Thigh 6.32mm Branch  4. Low Thigh 4.70mm   5. At Knee 4.94mm   6. High Calf 2.58mm   7. Low Calf 1.34mm   8. Ankle 1.21mm Multiple branches                Left Small Saphenous Vein Unable to visualize  10/28/2016 3:08 PM Maudry Mayhew, BS, RVT, RDCS, RDMS

## 2016-10-28 NOTE — Interval H&P Note (Signed)
History and Physical Interval Note:  10/28/2016 7:23 AM  Hayden [name]  has presented today for surgery, with the diagnosis of PVD  The various methods of treatment have been discussed with the patient and family. After consideration of risks, benefits and other options for treatment, the patient has consented to  Procedure(s): Abdominal Aortogram w/Lower Extremity (N/A) as a surgical intervention .  The patient's history has been reviewed, patient examined, no change in status, stable for surgery.  I have reviewed the patient's chart and labs.  Questions were answered to the patient's satisfaction.     Deitra Mayo

## 2016-10-28 NOTE — Progress Notes (Signed)
5Fr. Sheath removed from left femoral artery; manual pressure held for 20 minutes; vascular site level 0, no bleeding and no hematoma present.  Bedrest started at 0920 for 4 hours. Post sheath pull education given to patient.

## 2016-10-28 NOTE — H&P (View-Only) (Signed)
   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 The posterior tibial and peroneal arteries are patent. The anterior tibial artery on the right is occluded.Therefore, I have recommended we proceed with arteriography on Monday. He is agreeable. The procedure and risks were discussed with the patient.   SUBJECTIVE:   Getting peritoneal dialysis  PHYSICAL EXAM:   Vitals:   10/26/16 1613 10/26/16 2010 10/26/16 2120 10/27/16 0500  BP: 130/71 (!) 158/74 (!) 153/64 (!) 122/55  Pulse: 85 79 83 77  Resp: 18 16 16 18   Temp: 98.2 F (36.8 C) 98.9 F (37.2 C) 98.5 F (36.9 C) 98.4 F (36.9 C)  TempSrc: Oral  Oral Oral  SpO2: 93%  95% 94%  Weight:  198 lb 13.7 oz (90.2 kg) 198 lb 13.7 oz (90.2 kg)   Height:       No change in exam.  LABS:   Lab Results  Component Value Date   WBC 14.4 (H) 10/27/2016   HGB 9.8 (L) 10/27/2016   HCT 31.5 (L) 10/27/2016   MCV 78.8 10/27/2016   PLT 501 (H) 10/27/2016   Lab Results  Component Value Date   CREATININE 11.88 (H) 10/27/2016   Lab Results  Component Value Date   INR 1.36 11/04/2015   CBG (last 3)   Recent Labs  10/24/16 2126  GLUCAP 102*    PROBLEM LIST:    Principal Problem:   Cellulitis of great toe of right foot Active Problems:   Hypertensive urgency, malignant   End stage renal disease on dialysis (HCC)   Anxiety and depression   Leukocytosis   Hyperkalemia   Pressure injury of skin   CURRENT MEDS:   . ALPRAZolam  1 mg Oral BID  . amLODipine  10 mg Oral Daily  . aspirin EC  81 mg Oral Daily  . buPROPion  150 mg Oral Daily   And  . buPROPion  300 mg Oral QHS  . calcitRIOL  0.5 mcg Oral Daily  . calcium acetate  1,334 mg Oral TID WC  . cinacalcet  60 mg Oral Q breakfast  .  cloNIDine  0.2 mg Transdermal Weekly  . feeding supplement (NEPRO CARB STEADY)  237 mL Oral BID BM  . feeding supplement (PRO-STAT SUGAR FREE 64)  30 mL Oral BID  . gentamicin cream  1 application Topical Daily  . heparin  5,000 Units Subcutaneous Q8H  . hydrALAZINE  100 mg Oral BID  . isosorbide dinitrate  5 mg Oral BID  . labetalol  200 mg Oral BID  . pantoprazole  40 mg Oral Daily  . PARoxetine  40 mg Oral Daily  . pravastatin  20 mg Oral Daily  . saccharomyces boulardii  250 mg Oral BID  . Vitamin D (Ergocalciferol)  50,000 Units Oral Q7 days  . zolpidem  5 mg Oral QHS    Gae Gallop Beeper: [phone] Office: [phone] 10/27/2016

## 2016-10-28 NOTE — Progress Notes (Signed)
Patient is requesting an advancement in his diet. MD notified. Orders placed. Will continue to monitor.

## 2016-10-28 NOTE — Consult Note (Signed)
ORTHOPAEDIC CONSULTATION  REQUESTING PHYSICIAN: Ripudeep Krystal Eaton, MD  Chief Complaint: Painful ischemic ulcers right foot  HPI: Hayden [name] is a 46 y.o. male who presents with a painful ischemic right foot plantar ulcer and painful gangrene of the right great toe  Past Medical History:  Diagnosis Date  . Anemia March 2014  . Cancer ([place])    Kidney  . CHF (congestive heart failure) ([place]) 7591   Acute systolic and diastolic CHF  . Depression    & rage --  was in counseling....great now  . Diabetes mellitus    NO DM SINCE LOST 130LBS  . History of kidney cancer   . Hyperlipidemia   . Hypertension   . Obesity   . PONV (postoperative nausea and vomiting)   . Renal disorder   . Stroke [facility])    Past Surgical History:  Procedure Laterality Date  . AV FISTULA PLACEMENT Left 01/26/2013   Procedure: ARTERIOVENOUS (AV) FISTULA CREATION - LEFT RADIAL CEPHALIC AVF;  Surgeon: Angelia Mould, MD;  Location: [facility];  Service: Vascular;  Laterality: Left;  . CHOLECYSTECTOMY  11/06/2015   Procedure: LAPAROSCOPIC CHOLECYSTECTOMY;  Surgeon: Ralene Ok, MD;  Location: [facility];  Service: General;;  . HERNIA REPAIR    . NEPHRECTOMY Right 2008   partial  . TESTICLE TORSION REDUCTION    . TONSILLECTOMY AND ADENOIDECTOMY     Social History   Social History  . Marital status: Married    Spouse name: N/A  . Number of children: N/A  . Years of education: N/A   Occupational History  . [employer]    Social History Main Topics  . Smoking status: Current Every Day Smoker    Packs/day: 0.25    Years: 10.00    Types: Cigarettes  . Smokeless tobacco: Never Used  . Alcohol use No     Comment: "6 pack occ."-- lasts him six months-STOPPED 2 YRS AGO  . Drug use: Yes    Types: Marijuana     Comment: just in high school   . Sexual activity: Yes    Birth control/ protection: None   Other Topics Concern  . None   Social History Narrative   Positive for multiple uncles with  uncontrolled hypertension on his mother's side.  One uncle on his mother's side at age 72 had a myocardial infarction and one at age 13 had a myocardial  infarction.   Family History  Problem Relation Age of Onset  . Heart disease Mother     Heart Disease before age 56  . Deep vein thrombosis Father   . Heart attack Father   . Other Other    - negative except otherwise stated in the family history section Allergies  Allergen Reactions  . Dilaudid [Hydromorphone Hcl] Other (See Comments)    Abnormal behavior, "verbal and physically abusive", "psychosis"  . Morphine And Related Itching    Syncopal episode   Prior to Admission medications   Medication Sig Start Date End Date Taking? Authorizing Provider  ALPRAZolam Duanne Moron) 0.5 MG tablet Take 0.5-1 mg by mouth 2 (two) times daily as needed for anxiety.  03/18/14  Yes Historical Provider, MD  ALPRAZolam Duanne Moron) 1 MG tablet Take 1 mg by mouth at bedtime as needed for anxiety.   Yes Historical Provider, MD  amLODipine (NORVASC) 10 MG tablet Take 10 mg by mouth daily.   Yes Historical Provider, MD  aspirin EC 81 MG tablet Take 81 mg by mouth daily.  Yes Historical Provider, MD  buPROPion (WELLBUTRIN XL) 150 MG 24 hr tablet Take 150-300 mg by mouth daily. 150 mg in the morning, 300 mg at bedtime   Yes Historical Provider, MD  cinacalcet (SENSIPAR) 60 MG tablet Take 60 mg by mouth daily.   Yes Historical Provider, MD  clindamycin (CLEOCIN) 300 MG capsule Take 300 mg by mouth 4 (four) times daily.   Yes Historical Provider, MD  cloNIDine (CATAPRES - DOSED IN MG/24 HR) 0.2 mg/24hr patch Place 1 patch onto the skin once a week.   Yes Historical Provider, MD  hydrALAZINE (APRESOLINE) 100 MG tablet Take 100 mg by mouth 2 (two) times daily.   Yes Historical Provider, MD  isosorbide dinitrate (ISORDIL) 10 MG tablet Take 10 mg by mouth daily.   Yes Historical Provider, MD  labetalol (NORMODYNE) 100 MG tablet Take 150 mg by mouth 2 (two) times daily.     Yes Historical Provider, MD  omeprazole (PRILOSEC) 40 MG capsule Take 40 mg by mouth daily.   Yes Historical Provider, MD  PARoxetine (PAXIL) 20 MG tablet Take 40 mg by mouth daily.    Yes Historical Provider, MD  pravastatin (PRAVACHOL) 20 MG tablet Take 20 mg by mouth daily.   Yes Historical Provider, MD  Vitamin D, Ergocalciferol, (DRISDOL) 50000 units CAPS capsule Take 50,000 Units by mouth every 7 (seven) days. Every 7 days; on Thursday   Yes Historical Provider, MD  buPROPion (WELLBUTRIN SR) 150 MG 12 hr tablet Take 150-300 mg by mouth See admin instructions. 150 mg in the morning and 300 mg at bedtime    Historical Provider, MD  calcium acetate (PHOSLO) 667 MG capsule Take 2 capsules (1,334 mg total) by mouth 3 (three) times daily with meals. Patient not taking: Reported on 10/24/2016 11/20/15   Modena Jansky, MD  furosemide (LASIX) 80 MG tablet Take 80 mg by mouth 2 (two) times daily.    Historical Provider, MD  saccharomyces boulardii (FLORASTOR) 250 MG capsule Take 1 capsule (250 mg total) by mouth 2 (two) times daily. Patient not taking: Reported on 10/24/2016 11/09/15   Albertine Patricia, MD   No results found. - pertinent xrays, CT, MRI studies were reviewed and independently interpreted  Positive ROS: All other systems have been reviewed and were otherwise negative with the exception of those mentioned in the HPI and as above.  Physical Exam: General: Alert, no acute distress Psychiatric: Patient is competent for consent with normal mood and affect Lymphatic: No axillary or cervical lymphadenopathy Cardiovascular: No pedal edema Respiratory: No cyanosis, no use of accessory musculature GI: No organomegaly, abdomen is soft and non-tender  Skin: Patient has a ischemic ulcer on the plantar aspect of the right foot which is approximately 15 x 30 mm. Patient has thin atrophic skin to the right foot. He has thin black eschar over the great toe with superficial gangrenous  changes.   Neurologic: Patient does not have protective sensation bilateral lower extremities.   MUSCULOSKELETAL:  Examination patient does not have a palpable pulse in the foot is thin and atrophic there is no clinical signs of abscess. He has tenderness to palpation secondary to the gangrenous changes. Radiographs shows severe calcification of the vessels to the foot and ankle.  Assessment: Assessment: Severe peripheral vascular disease with gangrene involving the right foot.  Plan: Plan: Patient to have arteriogram study today with vascular surgery I will be involved as needed.  Thank you for the consult and the opportunity to see Mr.  [place], MD [facility] [phone] 7:16 AM

## 2016-10-28 NOTE — Progress Notes (Signed)
   Daily Progress Note  I have reviewed this patient's angiogram.  He has severe calcification of his entire arterial system with a R SFA occlusion.  Unfortunately, patient is unable to hold still for intervention, so will need general anesthesia to facilitate attempt at [facility] endovascular intervention.  If unable to cross occlusion, may explore peroneal artery in calf for a bypass attempt.  If too calcified to intervene, pt will need to consider R BKA vs AKA.  - will come by to talk with patient tomorrow  - Hybrid OR is booked for tomorrow, so earliest any procedure can be scheduled in Wednesday  Adele Barthel, MD, [facility] [place] Office: [phone] Pager: [phone]  10/28/2016, 9:21 AM

## 2016-10-28 NOTE — Progress Notes (Signed)
[facility]                                                                              Patient Demographics  Hayden [name], is a 46 y.o. male, DOB - 1971/04/24, HFW:[id number]  Admit date - 10/23/2016   Admitting Physician Norval Morton, MD  Outpatient Primary MD for the patient is Louis Meckel, MD  Outpatient specialists:   LOS - 4  days    Chief Complaint  Patient presents with  . Foot Injury       Brief summary  46 y.o. 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 The initial injury happened over one month ago when his foot caught in the track of the back seat of his sisters mini [name]. He initially tried to keep the area clean and washed. However, develop redness, swelling and pain. Approximately 2 weeks ago he was seen at the emergency department and was given 1 dose of vancomycin, but ended up leaving AMA. Patient saw his PCP who prescribed clindamycin, patient took for last 12-13 days however states that the pain continued to get worse and with the drainage from the wound.    Assessment & Plan    Principal Problem: Cellulitis of the right foot/Right great toe:  - failed treatment with outpatient antibiotics of clindamycin - Continue empiric antibiotics of vancomycin and Zosyn - Vascular surgery consulted, Arteriogram today, has R SFA occlusion, unfortunately was not able to remain still for intervention. Per vascular surgery, may need to consider right BKA versus AKA if endovascular procedure to right SFA or possible bypass grafting not successful  -Appreciate Dr. Sharol Given evaluating the patient as well today. -Continue current pain control management   Active problems Hypertensive urgency: Blood pressures noted to be as high as 240/110 on admission. Patient noted not recently taking home blood pressure medications  - Continue clonidine patch,  hydralazine, labetalol   Leukocytosis: WBC elevated at 14.6. Suspect secondary to above. - continue IV antibiotics per pharmacy  ESRD on peritoneal dialysis: Patient receives nightly peritoneal pulses dialysis. Initial BUN 102, creatinine 13.79. - Nephrology consulted for peritoneal dialysis - Continue Sensipar, calcitriol, and PhosLo  Hyperkalemia: Acute. Initial potassium 5.4 on admission. - Resolved  Elevated anion gap likely secondary to uremia.   Elevated alkaline phosphatase - Improving  Anxiety and depression - Anxiety improving, continue Xanax  increased to twice a day  Prolonged QTC - Avoid QTC prolonging medications, discontinued Lexapro   Code Status: Full CODE STATUS DVT Prophylaxis:  Heparin subcutaneous Family Communication: Discussed in detail with the patient, all imaging results, lab results explained to the patient    Disposition Plan:   Time Spent in minutes  25 minutes  Procedures:  Hemodialysis Arteriogram 4/30  Consultants:   Nephrology Vascular surgery Orthopedics, Dr. Sharol Given  Antimicrobials:   Vancomycin with HD IV 4/25>  Zosyn IV 4/25>   Medications  Scheduled Meds: . ALPRAZolam  1 mg Oral BID  . amLODipine  10 mg Oral Daily  . aspirin EC  81 mg Oral Daily  .  buPROPion  150 mg Oral Daily   And  . buPROPion  300 mg Oral QHS  . calcitRIOL  0.5 mcg Oral Daily  . calcium acetate  1,334 mg Oral TID WC  . cinacalcet  60 mg Oral Q breakfast  . cloNIDine  0.2 mg Transdermal Weekly  . darbepoetin (ARANESP) injection - NON-DIALYSIS  60 mcg Subcutaneous Q Sun-1800  . feeding supplement (NEPRO CARB STEADY)  237 mL Oral BID BM  . feeding supplement (PRO-STAT SUGAR FREE 64)  30 mL Oral BID  . gentamicin cream  1 application Topical Daily  . heparin  5,000 Units Subcutaneous Q8H  . hydrALAZINE  100 mg Oral BID  . isosorbide dinitrate  5 mg Oral BID  . labetalol  200 mg Oral BID  . pantoprazole  40 mg Oral Daily  . PARoxetine  40 mg  Oral Daily  . pravastatin  20 mg Oral Daily  . saccharomyces boulardii  250 mg Oral BID  . Vitamin D (Ergocalciferol)  50,000 Units Oral Q7 days  . zolpidem  5 mg Oral QHS   Continuous Infusions: . dialysis solution 2.5% low-MG/low-CA    . piperacillin-tazobactam (ZOSYN)  IV 3.375 g (10/28/16 1041)   PRN Meds:.acetaminophen **OR** acetaminophen, albuterol, fentaNYL (SUBLIMAZE) injection, dianeal solution for CAPD/CCPD with heparin, hydrALAZINE, ondansetron **OR** ondansetron (ZOFRAN) IV, oxyCODONE   Antibiotics   Anti-infectives    Start     Dose/Rate Route Frequency Ordered Stop   10/24/16 0600  piperacillin-tazobactam (ZOSYN) IVPB 3.375 g     3.375 g 12.5 mL/hr over 240 Minutes Intravenous Every 12 hours 10/23/16 1854     10/23/16 1845  piperacillin-tazobactam (ZOSYN) IVPB 3.375 g     3.375 g 100 mL/hr over 30 Minutes Intravenous  Once 10/23/16 1837 10/23/16 1928   10/23/16 1845  vancomycin (VANCOCIN) 2,000 mg in sodium chloride 0.9 % 500 mL IVPB     2,000 mg 250 mL/hr over 120 Minutes Intravenous  Once 10/23/16 1842 10/23/16 2131        Subjective:   Hayden [name] was seen and examined today. Seen after the arteriogram, somewhat frustrated and upset that the procedure was not completed.  Patient denies dizziness, chest pain, shortness of breath, abdominal pain, N/V/D/C, new weakness, numbess, tingling.   Objective:   Vitals:   10/28/16 0910 10/28/16 0915 10/28/16 0920 10/28/16 0925  BP: (!) 177/75 (!) 174/76 (!) 180/73   Pulse: 78 77 77 77  Resp: (!) 27 20 (!) 24 19  Temp:      TempSrc:      SpO2: 93% 92% 91% 95%  Weight:      Height:        Intake/Output Summary (Last 24 hours) at 10/28/16 1237 Last data filed at 10/28/16 0541  Gross per 24 hour  Intake              410 ml  Output                0 ml  Net              410 ml     Wt Readings from Last 3 Encounters:  10/28/16 89.9 kg (198 lb 3.1 oz)  07/12/16 99.8 kg (220 lb)  11/18/15 101.1 kg (222 lb  14.2 oz)     Exam  General: Alert and oriented x 3, NAD  HEENT:    Neck: Supple, no JVD  Cardiovascular: S1 S2 Clear, RRR  Respiratory: CTAB  Gastrointestinal: Soft, nontender,  nondistended, + bowel sounds, PD cath in place  Ext: no cyanosis clubbing or edema  Neuro: no new deficits  Skin: Dry gangrenous appearing changes on the right great toe with multiple areas of eschar   Psych: Normal affect and demeanor, alert and oriented x3    Data Reviewed:  I have personally reviewed following labs and imaging studies  Micro Results Recent Results (from the past 240 hour(s))  Blood culture (routine x 2)     Status: None (Preliminary result)   Collection Time: 10/23/16  5:20 PM  Result Value Ref Range Status   Specimen Description BLOOD RIGHT ANTECUBITAL  Final   Special Requests   Final    BOTTLES DRAWN AEROBIC AND ANAEROBIC Blood Culture adequate volume   Culture NO GROWTH 4 DAYS  Final   Report Status PENDING  Incomplete  Blood culture (routine x 2)     Status: None (Preliminary result)   Collection Time: 10/23/16  6:26 PM  Result Value Ref Range Status   Specimen Description BLOOD RIGHT ANTECUBITAL  Final   Special Requests   Final    BOTTLES DRAWN AEROBIC AND ANAEROBIC Blood Culture adequate volume   Culture NO GROWTH 4 DAYS  Final   Report Status PENDING  Incomplete  MRSA PCR Screening     Status: None   Collection Time: 10/24/16  5:03 AM  Result Value Ref Range Status   MRSA by PCR NEGATIVE NEGATIVE Final    Comment:        The GeneXpert MRSA Assay (FDA approved for NASAL specimens only), is one component of a comprehensive MRSA colonization surveillance program. It is not intended to diagnose MRSA infection nor to guide or monitor treatment for MRSA infections.     Radiology Reports Dg Foot Complete Right  Result Date: 10/23/2016 CLINICAL DATA:  Recent foot injury 2 weeks ago with persistent pain and discoloration EXAM: RIGHT FOOT COMPLETE - 3+ VIEW  COMPARISON:  None. FINDINGS: No acute fracture or dislocation is noted. Diffuse vascular calcifications are seen. IMPRESSION: No acute abnormality noted. Electronically Signed   By: Inez Catalina M.D.   On: 10/23/2016 17:50    Lab Data:  CBC:  Recent Labs Lab 10/23/16 1720 10/24/16 0458 10/26/16 0324 10/27/16 0454 10/28/16 0444  WBC 14.6* 9.9 14.5* 14.4* 14.2*  NEUTROABS 12.3*  --   --   --   --   HGB 11.3* 10.4* 10.3* 9.8* 10.1*  HCT 35.3* 32.5* 33.3* 31.5* 31.5*  MCV 77.4* 78.1 78.9 78.8 79.5  PLT 427* 435* 469* 501* 259*   Basic Metabolic Panel:  Recent Labs Lab 10/23/16 1720 10/24/16 0458 10/26/16 0324 10/27/16 0454 10/28/16 0444  NA 137 138 138 140 140  K 5.4* 4.6 4.2 3.8 3.7  CL 98* 98* 98* 96* 98*  CO2 21* 21* 21* 25 27  GLUCOSE 106* 70 98 126* 102*  BUN 102* 96* 82* 71* 62*  CREATININE 13.79* 13.22* 12.15* 11.88* 11.88*  CALCIUM 7.7* 7.9* 7.3* 7.3* 7.0*   GFR: Estimated Creatinine Clearance: 9 mL/min (A) (by C-G formula based on SCr of 11.88 mg/dL (H)). Liver Function Tests:  Recent Labs Lab 10/23/16 1720 10/24/16 0458 10/28/16 0444  AST 28 22 22   ALT 49 43 30  ALKPHOS 264* 243* 208*  BILITOT 1.2 1.2 0.7  PROT 6.4* 6.0* 5.8*  ALBUMIN 2.6* 2.5* 2.0*   No results for input(s): LIPASE, AMYLASE in the last 168 hours. No results for input(s): AMMONIA in the last 168 hours. Coagulation Profile:  No results for input(s): INR, PROTIME in the last 168 hours. Cardiac Enzymes: No results for input(s): CKTOTAL, CKMB, CKMBINDEX, TROPONINI in the last 168 hours. BNP (last 3 results) No results for input(s): PROBNP in the last 8760 hours. HbA1C: No results for input(s): HGBA1C in the last 72 hours. CBG:  Recent Labs Lab 10/24/16 2126  GLUCAP 102*   Lipid Profile: No results for input(s): CHOL, HDL, LDLCALC, TRIG, CHOLHDL, LDLDIRECT in the last 72 hours. Thyroid Function Tests: No results for input(s): TSH, T4TOTAL, FREET4, T3FREE, THYROIDAB in the  last 72 hours. Anemia Panel: No results for input(s): VITAMINB12, FOLATE, FERRITIN, TIBC, IRON, RETICCTPCT in the last 72 hours. Urine analysis:    Component Value Date/Time   COLORURINE AMBER (A) 11/04/2015 1930   APPEARANCEUR TURBID (A) 11/04/2015 1930   LABSPEC 1.019 11/04/2015 1930   PHURINE 5.5 11/04/2015 1930   GLUCOSEU 100 (A) 11/04/2015 1930   HGBUR NEGATIVE 11/04/2015 1930   BILIRUBINUR NEGATIVE 11/04/2015 1930   KETONESUR NEGATIVE 11/04/2015 1930   PROTEINUR >300 (A) 11/04/2015 1930   UROBILINOGEN 0.2 09/22/2012 1805   NITRITE NEGATIVE 11/04/2015 1930   LEUKOCYTESUR NEGATIVE 11/04/2015 1930     Cashlynn Yearwood M.D. [facility] 10/28/2016, 12:37 PM  Pager: [phone] Between 7am to 7pm - call Pager - [phone]  After 7pm go to [email or web address] - password [email or web address]  Call night coverage person covering after 7pm

## 2016-10-28 NOTE — Progress Notes (Signed)
[facility] Progress Note   Subjective: "I'm doing OK." Just returned from arteriogram. Upset because he could not have procedure done today.  "I have these movements-I can't help it". No C/Os pain.   Objective Vitals:   10/28/16 0910 10/28/16 0915 10/28/16 0920 10/28/16 0925  BP: (!) 177/75 (!) 174/76 (!) 180/73   Pulse: 78 77 77 77  Resp: (!) 27 20 (!) 24 19  Temp:      TempSrc:      SpO2: 93% 92% 91% 95%  Weight:      Height:       Physical Exam General: WNWD, pleasant and cooperative Heart: N0,I3 3/6 systolic M. No JVD. Lungs: CTAB A/P Abdomen: soft, non-tender Extremities: No LE Edema. Ulcers noted R great toe-open to air.  Dialysis Access: LFA AVF + bruit LUA PD cath, drsg CDI.    Additional Objective Labs: Basic Metabolic Panel:  Recent Labs Lab 10/26/16 0324 10/27/16 0454 10/28/16 0444  NA 138 140 140  K 4.2 3.8 3.7  CL 98* 96* 98*  CO2 21* 25 27  GLUCOSE 98 126* 102*  BUN 82* 71* 62*  CREATININE 12.15* 11.88* 11.88*  CALCIUM 7.3* 7.3* 7.0*   Liver Function Tests:  Recent Labs Lab 10/23/16 1720 10/24/16 0458 10/28/16 0444  AST 28 22 22   ALT 49 43 30  ALKPHOS 264* 243* 208*  BILITOT 1.2 1.2 0.7  PROT 6.4* 6.0* 5.8*  ALBUMIN 2.6* 2.5* 2.0*   No results for input(s): LIPASE, AMYLASE in the last 168 hours. CBC:  Recent Labs Lab 10/23/16 1720 10/24/16 0458 10/26/16 0324 10/27/16 0454 10/28/16 0444  WBC 14.6* 9.9 14.5* 14.4* 14.2*  NEUTROABS 12.3*  --   --   --   --   HGB 11.3* 10.4* 10.3* 9.8* 10.1*  HCT 35.3* 32.5* 33.3* 31.5* 31.5*  MCV 77.4* 78.1 78.9 78.8 79.5  PLT 427* 435* 469* 501* 515*   Blood Culture    Component Value Date/Time   SDES BLOOD RIGHT ANTECUBITAL 10/23/2016 1826   SPECREQUEST  10/23/2016 1826    BOTTLES DRAWN AEROBIC AND ANAEROBIC Blood Culture adequate volume   CULT NO GROWTH 4 DAYS 10/23/2016 1826   REPTSTATUS PENDING 10/23/2016 1826    Cardiac Enzymes: No results for input(s): CKTOTAL,  CKMB, CKMBINDEX, TROPONINI in the last 168 hours. CBG:  Recent Labs Lab 10/24/16 2126  GLUCAP 102*   Iron Studies: No results for input(s): IRON, TIBC, TRANSFERRIN, FERRITIN in the last 72 hours. @lablastinr3 @ Studies/Results: No results found. Medications: . dialysis solution 2.5% low-MG/low-CA    . piperacillin-tazobactam (ZOSYN)  IV 3.375 g (10/28/16 1041)   . ALPRAZolam  1 mg Oral BID  . amLODipine  10 mg Oral Daily  . aspirin EC  81 mg Oral Daily  . buPROPion  150 mg Oral Daily   And  . buPROPion  300 mg Oral QHS  . calcitRIOL  0.5 mcg Oral Daily  . calcium acetate  1,334 mg Oral TID WC  . cinacalcet  60 mg Oral Q breakfast  . cloNIDine  0.2 mg Transdermal Weekly  . darbepoetin (ARANESP) injection - NON-DIALYSIS  60 mcg Subcutaneous Q Sun-1800  . feeding supplement (NEPRO CARB STEADY)  237 mL Oral BID BM  . feeding supplement (PRO-STAT SUGAR FREE 64)  30 mL Oral BID  . gentamicin cream  1 application Topical Daily  . heparin  5,000 Units Subcutaneous Q8H  . hydrALAZINE  100 mg Oral BID  . isosorbide dinitrate  5 mg  Oral BID  . labetalol  200 mg Oral BID  . pantoprazole  40 mg Oral Daily  . PARoxetine  40 mg Oral Daily  . pravastatin  20 mg Oral Daily  . saccharomyces boulardii  250 mg Oral BID  . Vitamin D (Ergocalciferol)  50,000 Units Oral Q7 days  . zolpidem  5 mg Oral QHS   Dialysis Orders: [place] [facility].  CCPD: 3L fill 5 exchanges at night with 5th fill manually drained 1-1/2 hours later  Assessment/Plan: 1. Ischmic ulcers R foot/PAD: Had arteriogram this AM per Dr. Scot Dock. Has R SFA occlusion, unfortunately unable to remain still for intervention. To OR possibly Wednedsay for endovascular procedure to R SFA/possible bypass grafting. May need to consider R BKA vs R AKA. Continue Zosyn/Vanc per pharmacy.  2. ESRD -Continue CCPD. K+ 3.7.  3. Anemia -HGB 10.1. rec'd Aranesp 60 mcg IV 10/27/16. Follow HGB.  4. Secondary hyperparathyroidism - Continue VDRA,  calcimimetic, binders.  5. HTN/volume -  Wt stable 89.7-89.9 kg. BP elevated but has not had AM antihypertensive meds yet. Continues alternating 2.5-4.25% solution. 6. Nutrition - Albumin 2.0 Clear liquid diet at present. Carb Mod diet, prostat when able to eat. 7. DM: per primary  Rita H. Brown NP-C 10/28/2016, 10:55 AM  [facility] [phone]  Pt seen, examined and agree w A/P as above.  Kelly Splinter MD [employer] pager [phone]   10/28/2016, 5:02 PM

## 2016-10-28 NOTE — Op Note (Signed)
PATIENT: Hayden [name]      MRN: [id number] DOB: 03-30-1971    DATE OF PROCEDURE: 10/28/2016  INDICATIONS:    Hayden [name] is a 46 y.o. male with wounds on his right foot and evidence of infrainguinal arterial occlusive disease on duplex and by exam. He presents for arteriography.  PROCEDURE:    1. Ultrasound-guided access to the left common femoral artery 2. Aortogram with bilateral iliac arteriogram 3. Selective catheterization of the right external iliac artery with right lower extremity runoff 4. Selective catheterization of the right superficial femoral artery 5. Retrograde left femoral arteriogram  SURGEON: Judeth Cornfield. Scot Dock, MD, FACS  ANESTHESIA: Local with sedation   EBL: Minimal  TECHNIQUE: The patient was taken to the peripheral vascular lab and was sedated. The period of conscious sedation was 41 minutes.  During that time period, I was present face-to-face 100% of the time.  The patient was administered 1 mg of Versed and 50 g of fentanyl. The patient's heart rate, blood pressure, and oxygen saturation were monitored by the nurse continuously during the procedure.  Both groins were prepped and draped in the usual sterile fashion. Under ultrasound guidance, after the skin was anesthetized, the left common femoral artery was cannulated and a micropuncture needle introduced. A guidewire was introduced and then the micropuncture sheath introduced over the wire. This was exchanged for a 5 [place] sheath over a [employer] wire. A pigtail catheter was positioned at the L1 vertebral body and flush aortogram obtained. The catheter was in position above the aortic bifurcation and oblique iliac projection was obtained. I then exchanged the pigtail catheter for a crossover catheter which was positioned into the right common iliac artery. I directed an angled Glidewire into the right external iliac artery and exchanged the crossover catheter for a straight catheter. Selective right  external iliac arteriogram was obtained with right lower extremity runoff. The patient was moving significantly with involuntary jerking motions making visualization difficult. Therefore we shot individual films down the right leg. In order to get better visualization of the distal tibial vessels, I advanced the angled Glidewire into the superficial femoral artery and then advanced the straight catheter into the superficial femoral artery. An additional spot film was obtained the right leg. The straight catheter was then removed and a retrograde left femoral arteriogram obtained with left lower extremity runoff. At the completion of the procedure, the patient was transferred to the holding area for removal of the sheath. No immediate complications were noted.  FINDINGS:    There are single renal arteries bilaterally with no significant renal artery stenosis identified.   There is no significant inflow disease. The infrarenal aorta, bilateral common iliac arteries, bilateral external iliac arteries, and bilateral hypogastric arteries are patent.   On the right side, which is the site of concern, the common femoral and deep femoral arteries are patent. The proximal superficial femoral artery is patent. There is diffuse calcific disease throughout the infrainguinal arteries. There is an occlusion of the distal superficial femoral artery and above-knee popliteal artery over a length of approximately 15 cm. There is reconstitution of the above-knee popliteal artery Which has moderate disease. The below-knee popliteal artery is patent. The anterior tibial artery on the right is occluded. The dominant runoff is the peroneal artery which is diseased distally. The posterior tibial artery has severe diffuse disease.   On the left side, the common femoral, deep femoral, and superficial femoral arteries are patent. The popliteal artery and tibial peroneal trunk  are patent. The peroneal artery is patent. There is  moderate diffuse disease throughout the anterior tibial and posterior tibial arteries.  Deitra Mayo, MD, FACS [facility]  DATE OF DICTATION:   10/28/2016

## 2016-10-29 ENCOUNTER — Encounter (HOSPITAL_BASED_OUTPATIENT_CLINIC_OR_DEPARTMENT_OTHER): Payer: 59

## 2016-10-29 ENCOUNTER — Encounter (HOSPITAL_COMMUNITY): Payer: Self-pay | Admitting: Nephrology

## 2016-10-29 DIAGNOSIS — I96 Gangrene, not elsewhere classified: Secondary | ICD-10-CM

## 2016-10-29 DIAGNOSIS — L03115 Cellulitis of right lower limb: Secondary | ICD-10-CM

## 2016-10-29 DIAGNOSIS — N186 End stage renal disease: Secondary | ICD-10-CM

## 2016-10-29 DIAGNOSIS — I350 Nonrheumatic aortic (valve) stenosis: Secondary | ICD-10-CM

## 2016-10-29 DIAGNOSIS — Z992 Dependence on renal dialysis: Secondary | ICD-10-CM

## 2016-10-29 HISTORY — DX: Nonrheumatic aortic (valve) stenosis: I35.0

## 2016-10-29 LAB — RENAL FUNCTION PANEL
ALBUMIN: 2 g/dL — AB (ref 3.5–5.0)
ANION GAP: 16 — AB (ref 5–15)
BUN: 55 mg/dL — ABNORMAL HIGH (ref 6–20)
CO2: 25 mmol/L (ref 22–32)
Calcium: 7.2 mg/dL — ABNORMAL LOW (ref 8.9–10.3)
Chloride: 97 mmol/L — ABNORMAL LOW (ref 101–111)
Creatinine, Ser: 11.46 mg/dL — ABNORMAL HIGH (ref 0.61–1.24)
GFR calc non Af Amer: 5 mL/min — ABNORMAL LOW (ref >60)
GFR, EST AFRICAN AMERICAN: 5 mL/min — AB (ref >60)
Glucose, Bld: 86 mg/dL (ref 65–99)
PHOSPHORUS: 9.1 mg/dL — AB (ref 2.5–4.6)
Potassium: 3.7 mmol/L (ref 3.5–5.1)
SODIUM: 138 mmol/L (ref 135–145)

## 2016-10-29 LAB — VANCOMYCIN, RANDOM: Vancomycin Rm: 22

## 2016-10-29 MED ORDER — HEPARIN 1000 UNIT/ML FOR PERITONEAL DIALYSIS: 2500.0000 [IU] | INTRAMUSCULAR | Status: DC | PRN

## 2016-10-29 MED ORDER — FENTANYL CITRATE (PF) 100 MCG/2ML IJ SOLN
25.0000 ug | INTRAMUSCULAR | Status: DC | PRN
  Administered 2016-10-30: 50 ug via INTRAVENOUS
  Administered 2016-10-30: 150 ug via INTRAVENOUS
  Administered 2016-10-30 – 2016-11-18 (×34): 50 ug via INTRAVENOUS
  Filled 2016-10-29 (×35): qty 2

## 2016-10-29 MED ORDER — GENTAMICIN SULFATE 0.1 % EX CREA
1.0000 "application " | TOPICAL_CREAM | Freq: Every day | CUTANEOUS | Status: DC
  Administered 2016-10-30 – 2016-11-09 (×8): 1 via TOPICAL
  Filled 2016-10-29 (×2): qty 15

## 2016-10-29 MED ORDER — DELFLEX-LC/1.5% DEXTROSE 344 MOSM/L IP SOLN: INTRAPERITONEAL | Status: DC

## 2016-10-29 MED ORDER — VANCOMYCIN HCL 500 MG IV SOLR
500.0000 mg | INTRAVENOUS | Status: DC
  Filled 2016-10-29: qty 500

## 2016-10-29 NOTE — Progress Notes (Signed)
Pharmacy Antibiotic Note Hayden [name] is a 46 y.o. male admitted on 10/23/2016 with right foot wound/cellulitis that failed to respond to out dose of Dalvance 1500mg  IV on 10/10/16 and PO clindamycin. History of ESRD on PD at home. Pharmacy consulted to dose Zosyn and vancomycin.    Random vancomycin level remains above goal at 22 this morning, but trending down from previous levels. Patient has not missed a day of CCPD- has been getting 5 exchanges at night with last exchange drained manually 60-51minutes later. All 2.5% fluids.  Plan: -Continue Zosyn 3.375g IV q12h (infused over 4 hours) -Based on kinetics, patient's maintenance dose of vancomycin is 638mg  q7days, but due to ease of administration, will change to Vancomycin 500mg  IV q6days starting on 5/2 as anticipate vancomycin level to be <87mcg/mL at that time -Follow vascular and ortho plans along with changes to PD prescription and length of therapy for antibiotics  Height: 5\' 10"  (177.8 cm) Weight: 195 lb 8.8 oz (88.7 kg) IBW/kg (Calculated) : 73  Temp (24hrs), Avg:97.9 F (36.6 C), Min:97.8 F (36.6 C), Max:98 F (36.7 C)   Recent Labs Lab 10/23/16 1720 10/23/16 1726 10/23/16 2108 10/24/16 0458  10/26/16 0324 10/27/16 0454 10/28/16 0444 10/29/16 0524  WBC 14.6*  --   --  9.9  --  14.5* 14.4* 14.2*  --   CREATININE 13.79*  --   --  13.22*  --  12.15* 11.88* 11.88* 11.46*  LATICACIDVEN  --  0.73 0.45*  --   --   --   --   --   --   VANCORANDOM  --   --   --   --   < > 28 26  --  22  < > = values in this interval not displayed.  Estimated Creatinine Clearance: 9.2 mL/min (A) (by C-G formula based on SCr of 11.46 mg/dL (H)).    Allergies  Allergen Reactions  . Dilaudid [Hydromorphone Hcl] Other (See Comments)    Abnormal behavior, "verbal and physically abusive", "psychosis"  . Morphine And Related Itching    Syncopal episode    Antimicrobials this admission: Dalvance 1500mg  IV once 10/10/16 at [place]  infusion center  Zosyn 4/25 >> vancomycin 4/25 >>    4/28 VR = 28 4/29 VR = 26 5/1 VR = 22  Microbiology results: 4/25 BCx: neg 4/26 MRSA PCR neg   Thank you for allowing pharmacy to be a part of this patient's care.  Taimane Stimmel D. Taliya Mcclard, PharmD, BCPS Clinical Pharmacist Pager: [phone] 10/29/2016 11:11 AM

## 2016-10-29 NOTE — Progress Notes (Signed)
Paged surgery and gave patient's wife phone [phone] ,wanted to be called for the scheduled procedure tomorrow.

## 2016-10-29 NOTE — Progress Notes (Signed)
[facility] Progress Note   Subjective: scared about losing his toes possibly.  No sob or cough.   Vitals:   10/28/16 1800 10/28/16 2029 10/29/16 0534 10/29/16 0650  BP: (!) 143/79 (!) 152/83 139/76 (!) 151/75  Pulse: 83 78 72 75  Resp: 18 18 18 18   Temp: 98 F (36.7 C) 98 F (36.7 C) 97.8 F (36.6 C) 97.9 F (36.6 C)  TempSrc: Oral Oral Oral Tympanic  SpO2:  96% 95% 94%  Weight: 88.7 kg (195 lb 8.8 oz)     Height:        Inpatient medications: . ALPRAZolam  1 mg Oral BID  . amLODipine  10 mg Oral Daily  . aspirin EC  81 mg Oral Daily  . buPROPion  150 mg Oral Daily   And  . buPROPion  300 mg Oral QHS  . calcitRIOL  0.5 mcg Oral Daily  . calcium acetate  1,334 mg Oral TID WC  . cinacalcet  60 mg Oral Q breakfast  . cloNIDine  0.2 mg Transdermal Weekly  . darbepoetin (ARANESP) injection - NON-DIALYSIS  60 mcg Subcutaneous Q Sun-1800  . feeding supplement (NEPRO CARB STEADY)  237 mL Oral BID BM  . feeding supplement (PRO-STAT SUGAR FREE 64)  30 mL Oral BID  . gentamicin cream  1 application Topical Daily  . heparin  5,000 Units Subcutaneous Q8H  . hydrALAZINE  100 mg Oral BID  . isosorbide dinitrate  5 mg Oral BID  . labetalol  200 mg Oral BID  . pantoprazole  40 mg Oral Daily  . PARoxetine  40 mg Oral Daily  . pravastatin  20 mg Oral Daily  . saccharomyces boulardii  250 mg Oral BID  . Vitamin D (Ergocalciferol)  50,000 Units Oral Q7 days  . zolpidem  5 mg Oral QHS   . dialysis solution 2.5% low-MG/low-CA    . piperacillin-tazobactam (ZOSYN)  IV Stopped (10/29/16 0244)   acetaminophen **OR** acetaminophen, albuterol, fentaNYL (SUBLIMAZE) injection, dianeal solution for CAPD/CCPD with heparin, hydrALAZINE, ondansetron **OR** ondansetron (ZOFRAN) IV, oxyCODONE  Exam: Alert, chron ill appearing, lethargic from pain meds probably No jvd Chest clear bilat RRR no mrg Abd soft ntnd Ext no LE edema, R foot with dry gangrenous changes 1st toe mostly ,  somewhat on the lesser toes NF, ox 3 PD cath mid abd, left arm AVF +bruit  Dialysis: [facility].  CCPD: 3L fill 5 exchanges at night with 5th fill manually drained 1-1/2 hours later. Uses mostly 1's and 2's at home.       Assessment: 1. Ischmic ulcers R foot/PAD: sp a-gram, per [facility] planning surgery/ procedure soon 2. ESRD -Continue PD 3. Anemia -HGB 10.1. rec'd Aranesp 60 mcg IV 10/27/16. Follow HGB.  4. Secondary hyperparathyroidism - Continue VDRA, calcimimetic, binders.  5. HTN - continues on 4 BP meds 6. Volume - no vol excess on exam , wt's stable and down 5-7kg from admission 6. Nutrition - Albumin 2.0 Clear liquid diet at present. Carb Mod diet, prostat when able to eat. 7. DM: per primary  Plan - cont CCPD, all 1.5 %   Kelly Splinter MD [facility] pager [phone]   10/29/2016, 11:09 AM    Recent Labs Lab 10/27/16 0454 10/28/16 0444 10/29/16 0524  NA 140 140 138  K 3.8 3.7 3.7  CL 96* 98* 97*  CO2 25 27 25   GLUCOSE 126* 102* 86  BUN 71* 62* 55*  CREATININE 11.88* 11.88* 11.46*  CALCIUM 7.3* 7.0* 7.2*  PHOS  --   --  9.1*    Recent Labs Lab 10/23/16 1720 10/24/16 0458 10/28/16 0444 10/29/16 0524  AST 28 22 22   --   ALT 49 43 30  --   ALKPHOS 264* 243* 208*  --   BILITOT 1.2 1.2 0.7  --   PROT 6.4* 6.0* 5.8*  --   ALBUMIN 2.6* 2.5* 2.0* 2.0*    Recent Labs Lab 10/23/16 1720  10/26/16 0324 10/27/16 0454 10/28/16 0444  WBC 14.6*  < > 14.5* 14.4* 14.2*  NEUTROABS 12.3*  --   --   --   --   HGB 11.3*  < > 10.3* 9.8* 10.1*  HCT 35.3*  < > 33.3* 31.5* 31.5*  MCV 77.4*  < > 78.9 78.8 79.5  PLT 427*  < > 469* 501* 515*  < > = values in this interval not displayed. Iron/TIBC/Ferritin/ %Sat    Component Value Date/Time   IRON 7 (L) 11/15/2015 1342   TIBC 164 (L) 11/15/2015 1342   FERRITIN 1,444 (H) 11/15/2015 1342   IRONPCTSAT 4 (L) 11/15/2015 1342

## 2016-10-29 NOTE — Progress Notes (Signed)
[facility]                                                                              Patient Demographics  Hayden [name], is a 46 y.o. male, DOB - 18-Jan-1971, CHY:[id number]  Admit date - 10/23/2016   Admitting Physician Norval Morton, MD  Outpatient Primary MD for the patient is Louis Meckel, MD  Outpatient specialists:   LOS - 5  days    Chief Complaint  Patient presents with  . Foot Injury       Brief summary  46 y.o. 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 The initial injury happened over one month ago when his foot caught in the track of the back seat of his sisters mini [name]. He initially tried to keep the area clean and washed. However, develop redness, swelling and pain. Approximately 2 weeks ago he was seen at the emergency department and was given 1 dose of vancomycin, but ended up leaving AMA. Patient saw his PCP who prescribed clindamycin, patient took for last 12-13 days however states that the pain continued to get worse and with the drainage from the wound.    Assessment & Plan    Principal Problem: Cellulitis of the right foot/Right great toe:  - failed treatment with outpatient antibiotics of clindamycin - Continue empiric antibiotics of vancomycin and Zosyn - Vascular surgery consulted, Arteriogram showed R SFA occlusion, unfortunately was not able to remain still for intervention. Per vascular surgery, may need to consider right BKA versus AKA if endovascular procedure to right SFA or possible bypass grafting not successful  -Appreciate Dr. Jess Barters evaluation -Continue current pain control management   Active problems Hypertensive urgency: Blood pressures noted to be as high as 240/110 on admission. Patient noted not recently taking home blood pressure medications  - Continue clonidine patch, hydralazine, labetalol  -  BP currently improving  Leukocytosis: WBC elevated at 14.6. Suspect secondary to above. - continue IV antibiotics per pharmacy  ESRD on peritoneal dialysis: Patient receives nightly peritoneal pulses dialysis. Initial BUN 102, creatinine 13.79. - Nephrology consulted for peritoneal dialysis - Continue Sensipar, calcitriol, and PhosLo  Hyperkalemia: Acute. Initial potassium 5.4 on admission. - Resolved  Elevated anion gap likely secondary to uremia.   Elevated alkaline phosphatase - Improving  Anxiety and depression - Anxiety improving, continue Xanax  increased to twice a day  Prolonged QTC - Avoid QTC prolonging medications, discontinued Lexapro   Code Status: Full CODE STATUS DVT Prophylaxis:  Heparin subcutaneous Family Communication: Discussed in detail with the patient, all imaging results, lab results explained to the patient    Disposition Plan:   Time Spent in minutes  25 minutes  Procedures:  Hemodialysis Arteriogram 4/30  Consultants:   Nephrology Vascular surgery Orthopedics, Dr. Sharol Given  Antimicrobials:   Vancomycin with HD IV 4/25>  Zosyn IV 4/25>   Medications  Scheduled Meds: . ALPRAZolam  1 mg Oral BID  . amLODipine  10 mg Oral Daily  . aspirin EC  81 mg Oral Daily  . buPROPion  150 mg Oral Daily   And  . buPROPion  300 mg Oral QHS  . calcitRIOL  0.5 mcg Oral Daily  . calcium acetate  1,334 mg Oral TID WC  . cinacalcet  60 mg Oral Q breakfast  . cloNIDine  0.2 mg Transdermal Weekly  . darbepoetin (ARANESP) injection - NON-DIALYSIS  60 mcg Subcutaneous Q Sun-1800  . feeding supplement (NEPRO CARB STEADY)  237 mL Oral BID BM  . feeding supplement (PRO-STAT SUGAR FREE 64)  30 mL Oral BID  . gentamicin cream  1 application Topical Daily  . heparin  5,000 Units Subcutaneous Q8H  . hydrALAZINE  100 mg Oral BID  . isosorbide dinitrate  5 mg Oral BID  . labetalol  200 mg Oral BID  . pantoprazole  40 mg Oral Daily  . PARoxetine  40 mg  Oral Daily  . pravastatin  20 mg Oral Daily  . saccharomyces boulardii  250 mg Oral BID  . Vitamin D (Ergocalciferol)  50,000 Units Oral Q7 days  . zolpidem  5 mg Oral QHS   Continuous Infusions: . dialysis solution 2.5% low-MG/low-CA    . piperacillin-tazobactam (ZOSYN)  IV Stopped (10/29/16 0244)   PRN Meds:.acetaminophen **OR** acetaminophen, albuterol, fentaNYL (SUBLIMAZE) injection, dianeal solution for CAPD/CCPD with heparin, hydrALAZINE, ondansetron **OR** ondansetron (ZOFRAN) IV, oxyCODONE   Antibiotics   Anti-infectives    Start     Dose/Rate Route Frequency Ordered Stop   10/24/16 0600  piperacillin-tazobactam (ZOSYN) IVPB 3.375 g     3.375 g 12.5 mL/hr over 240 Minutes Intravenous Every 12 hours 10/23/16 1854     10/23/16 1845  piperacillin-tazobactam (ZOSYN) IVPB 3.375 g     3.375 g 100 mL/hr over 30 Minutes Intravenous  Once 10/23/16 1837 10/23/16 1928   10/23/16 1845  vancomycin (VANCOCIN) 2,000 mg in sodium chloride 0.9 % 500 mL IVPB     2,000 mg 250 mL/hr over 120 Minutes Intravenous  Once 10/23/16 1842 10/23/16 2131        Subjective:   Hayden [name] was seen and examined today. Somewhat disappointed about his right foot. Hoping that procedure will improve the blood flow and his foot will not need amputation. States pain is not well controlled today and pain in the foot is 8 out of 10 sharp and throbbing. Patient denies dizziness, chest pain, shortness of breath, abdominal pain, N/V/D/C, new weakness, numbess, tingling.   Objective:   Vitals:   10/28/16 1800 10/28/16 2029 10/29/16 0534 10/29/16 0650  BP: (!) 143/79 (!) 152/83 139/76 (!) 151/75  Pulse: 83 78 72 75  Resp: 18 18 18 18   Temp: 98 F (36.7 C) 98 F (36.7 C) 97.8 F (36.6 C) 97.9 F (36.6 C)  TempSrc: Oral Oral Oral Tympanic  SpO2:  96% 95% 94%  Weight: 88.7 kg (195 lb 8.8 oz)     Height:        Intake/Output Summary (Last 24 hours) at 10/29/16 1033 Last data filed at 10/29/16 0244   Gross per 24 hour  Intake               50 ml  Output                0 ml  Net               50 ml     Wt Readings from Last 3 Encounters:  10/28/16 88.7 kg (195 lb 8.8 oz)  07/12/16 99.8 kg (220 lb)  11/18/15 101.1  kg (222 lb 14.2 oz)     Exam  General: Alert and oriented x 3, NAD  HEENT:    Neck: Supple, no JVD  Cardiovascular: S1 S2 Clear, RRR  Respiratory: CTAB  Gastrointestinal: Soft, NT, NBS, PD cath in place  Ext: no cyanosis clubbing or edema  Neuro: no new deficits  Skin: Dry gangrenous appearing changes on the right great toe with multiple areas of eschar   Psych: Normal affect and demeanor, alert and oriented x3    Data Reviewed:  I have personally reviewed following labs and imaging studies  Micro Results Recent Results (from the past 240 hour(s))  Blood culture (routine x 2)     Status: None   Collection Time: 10/23/16  5:20 PM  Result Value Ref Range Status   Specimen Description BLOOD RIGHT ANTECUBITAL  Final   Special Requests   Final    BOTTLES DRAWN AEROBIC AND ANAEROBIC Blood Culture adequate volume   Culture NO GROWTH 5 DAYS  Final   Report Status 10/28/2016 FINAL  Final  Blood culture (routine x 2)     Status: None   Collection Time: 10/23/16  6:26 PM  Result Value Ref Range Status   Specimen Description BLOOD RIGHT ANTECUBITAL  Final   Special Requests   Final    BOTTLES DRAWN AEROBIC AND ANAEROBIC Blood Culture adequate volume   Culture NO GROWTH 5 DAYS  Final   Report Status 10/28/2016 FINAL  Final  MRSA PCR Screening     Status: None   Collection Time: 10/24/16  5:03 AM  Result Value Ref Range Status   MRSA by PCR NEGATIVE NEGATIVE Final    Comment:        The GeneXpert MRSA Assay (FDA approved for NASAL specimens only), is one component of a comprehensive MRSA colonization surveillance program. It is not intended to diagnose MRSA infection nor to guide or monitor treatment for MRSA infections.     Radiology  Reports Dg Foot Complete Right  Result Date: 10/23/2016 CLINICAL DATA:  Recent foot injury 2 weeks ago with persistent pain and discoloration EXAM: RIGHT FOOT COMPLETE - 3+ VIEW COMPARISON:  None. FINDINGS: No acute fracture or dislocation is noted. Diffuse vascular calcifications are seen. IMPRESSION: No acute abnormality noted. Electronically Signed   By: Inez Catalina M.D.   On: 10/23/2016 17:50    Lab Data:  CBC:  Recent Labs Lab 10/23/16 1720 10/24/16 0458 10/26/16 0324 10/27/16 0454 10/28/16 0444  WBC 14.6* 9.9 14.5* 14.4* 14.2*  NEUTROABS 12.3*  --   --   --   --   HGB 11.3* 10.4* 10.3* 9.8* 10.1*  HCT 35.3* 32.5* 33.3* 31.5* 31.5*  MCV 77.4* 78.1 78.9 78.8 79.5  PLT 427* 435* 469* 501* 825*   Basic Metabolic Panel:  Recent Labs Lab 10/24/16 0458 10/26/16 0324 10/27/16 0454 10/28/16 0444 10/29/16 0524  NA 138 138 140 140 138  K 4.6 4.2 3.8 3.7 3.7  CL 98* 98* 96* 98* 97*  CO2 21* 21* 25 27 25   GLUCOSE 70 98 126* 102* 86  BUN 96* 82* 71* 62* 55*  CREATININE 13.22* 12.15* 11.88* 11.88* 11.46*  CALCIUM 7.9* 7.3* 7.3* 7.0* 7.2*  PHOS  --   --   --   --  9.1*   GFR: Estimated Creatinine Clearance: 9.2 mL/min (A) (by C-G formula based on SCr of 11.46 mg/dL (H)). Liver Function Tests:  Recent Labs Lab 10/23/16 1720 10/24/16 0458 10/28/16 0444 10/29/16 0524  AST 28  22 22  --   ALT 49 43 30  --   ALKPHOS 264* 243* 208*  --   BILITOT 1.2 1.2 0.7  --   PROT 6.4* 6.0* 5.8*  --   ALBUMIN 2.6* 2.5* 2.0* 2.0*   No results for input(s): LIPASE, AMYLASE in the last 168 hours. No results for input(s): AMMONIA in the last 168 hours. Coagulation Profile: No results for input(s): INR, PROTIME in the last 168 hours. Cardiac Enzymes: No results for input(s): CKTOTAL, CKMB, CKMBINDEX, TROPONINI in the last 168 hours. BNP (last 3 results) No results for input(s): PROBNP in the last 8760 hours. HbA1C: No results for input(s): HGBA1C in the last 72  hours. CBG:  Recent Labs Lab 10/24/16 2126  GLUCAP 102*   Lipid Profile: No results for input(s): CHOL, HDL, LDLCALC, TRIG, CHOLHDL, LDLDIRECT in the last 72 hours. Thyroid Function Tests: No results for input(s): TSH, T4TOTAL, FREET4, T3FREE, THYROIDAB in the last 72 hours. Anemia Panel: No results for input(s): VITAMINB12, FOLATE, FERRITIN, TIBC, IRON, RETICCTPCT in the last 72 hours. Urine analysis:    Component Value Date/Time   COLORURINE AMBER (A) 11/04/2015 1930   APPEARANCEUR TURBID (A) 11/04/2015 1930   LABSPEC 1.019 11/04/2015 1930   PHURINE 5.5 11/04/2015 1930   GLUCOSEU 100 (A) 11/04/2015 1930   HGBUR NEGATIVE 11/04/2015 1930   BILIRUBINUR NEGATIVE 11/04/2015 1930   KETONESUR NEGATIVE 11/04/2015 1930   PROTEINUR >300 (A) 11/04/2015 1930   UROBILINOGEN 0.2 09/22/2012 1805   NITRITE NEGATIVE 11/04/2015 1930   LEUKOCYTESUR NEGATIVE 11/04/2015 1930     Ripudeep Rai M.D. [facility] 10/29/2016, 10:33 AM  Pager: [phone] Between 7am to 7pm - call Pager - [phone]  After 7pm go to [email or web address] - password [email or web address]  Call night coverage person covering after 7pm

## 2016-10-29 NOTE — Care Management Important Message (Signed)
Important Message  Patient Details  Name: Hayden [name] MRN: [id number] Date of Birth: 04-15-1971   Medicare Important Message Given:  Yes    [name] 10/29/2016, 1:43 PM

## 2016-10-29 NOTE — Progress Notes (Signed)
   Daily Progress Note   Assessment/Planning:   R foot gangrene, ESRD-PD   Angio demonstrates: 1. Extensive calcification throughout: tibial arteries are completely calcified down to level of ankle 2. 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 Occlusion of 2/3 R tibial arteries 4. R Peroneal artery is only runoff: not connected due to R distal SFA occlusion 5. No obvious digital flow to forefoot of R foot  I had a frank discussion with the patient.  He is aware that he is at significant risk for limb loss.  I offered him: right antegrade cannulation, right leg runoff, and attempt at recannulation of R SFA I discussed with the patient the nature of angiographic procedures, especially the limited patencies of any endovascular intervention.   The patient is aware of that the risks of an angiographic procedure include but are not limited to: bleeding, infection, access site complications, renal failure, embolization, rupture of vessel, dissection, arteriovenous fistula, possible need for emergent surgical intervention, possible need for surgical procedures to treat the patient's pathology, anaphylactic reaction to contrast, and stroke and death.   The patient is aware of the risks and agrees to proceed. The patient is aware even with intervention, he likely will need a right great toe amputation.   Subjective  - 1 Day Post-Op   c/o pain in R foot   Objective   Vitals:   10/28/16 1800 10/28/16 2029 10/29/16 0534 10/29/16 0650  BP: (!) 143/79 (!) 152/83 139/76 (!) 151/75  Pulse: 83 78 72 75  Resp: 18 18 18 18   Temp: 98 F (36.7 C) 98 F (36.7 C) 97.8 F (36.6 C) 97.9 F (36.6 C)  TempSrc: Oral Oral Oral Tympanic  SpO2:  96% 95%   Weight: 195 lb 8.8 oz (88.7 kg)     Height:         Intake/Output Summary (Last 24 hours) at 10/29/16 0729 Last data filed at 10/29/16 0244  Gross per 24 hour  Intake              290 ml  Output                0 ml  Net               290 ml    VASC Cannulation site without hematoma or echymosis, R great toe with dry gangrene, black eschar scattered in R foot    Laboratory CBC CBC Latest Ref Rng & Units 10/28/2016 10/27/2016 10/26/2016  WBC 4.0 - 10.5 K/uL 14.2(H) 14.4(H) 14.5(H)  Hemoglobin 13.0 - 17.0 g/dL 10.1(L) 9.8(L) 10.3(L)  Hematocrit 39.0 - 52.0 % 31.5(L) 31.5(L) 33.3(L)  Platelets 150 - 400 K/uL 515(H) 501(H) 469(H)     BMET    Component Value Date/Time   NA 138 10/29/2016 0524   K 3.7 10/29/2016 0524   CL 97 (L) 10/29/2016 0524   CO2 25 10/29/2016 0524   GLUCOSE 86 10/29/2016 0524   BUN 55 (H) 10/29/2016 0524   CREATININE 11.46 (H) 10/29/2016 0524   CALCIUM 7.2 (L) 10/29/2016 0524   GFRNONAA 5 (L) 10/29/2016 0524   GFRAA 5 (L) 10/29/2016 0524      Adele Barthel, MD, FACS [facility] Office: [phone] Pager: [phone]  10/29/2016, 7:29 AM

## 2016-10-30 ENCOUNTER — Inpatient Hospital Stay (HOSPITAL_COMMUNITY): Payer: Medicare Other | Admitting: Certified Registered Nurse Anesthetist

## 2016-10-30 ENCOUNTER — Encounter (HOSPITAL_COMMUNITY): Payer: Self-pay | Admitting: Certified Registered Nurse Anesthetist

## 2016-10-30 ENCOUNTER — Encounter (HOSPITAL_COMMUNITY)
Admission: EM | Disposition: A | Discharge status: Rehabilitation Facility | Payer: Self-pay | Source: Home / Self Care | Attending: Internal Medicine

## 2016-10-30 DIAGNOSIS — Z01810 Encounter for preprocedural cardiovascular examination: Secondary | ICD-10-CM

## 2016-10-30 DIAGNOSIS — E875 Hyperkalemia: Secondary | ICD-10-CM

## 2016-10-30 DIAGNOSIS — I160 Hypertensive urgency: Secondary | ICD-10-CM

## 2016-10-30 DIAGNOSIS — R079 Chest pain, unspecified: Secondary | ICD-10-CM

## 2016-10-30 HISTORY — PX: LOWER EXTREMITY ANGIOGRAM: SHX5508

## 2016-10-30 LAB — CBC
HEMATOCRIT: 33 % — AB (ref 39.0–52.0)
HEMOGLOBIN: 10.4 g/dL — AB (ref 13.0–17.0)
MCH: 24.9 pg — ABNORMAL LOW (ref 26.0–34.0)
MCHC: 31.5 g/dL (ref 30.0–36.0)
MCV: 79.1 fL (ref 78.0–100.0)
Platelets: 588 10*3/uL — ABNORMAL HIGH (ref 150–400)
RBC: 4.17 MIL/uL — ABNORMAL LOW (ref 4.22–5.81)
RDW: 19.3 % — ABNORMAL HIGH (ref 11.5–15.5)
WBC: 13.2 10*3/uL — AB (ref 4.0–10.5)

## 2016-10-30 LAB — BASIC METABOLIC PANEL
ANION GAP: 17 — AB (ref 5–15)
BUN: 54 mg/dL — AB (ref 6–20)
CO2: 26 mmol/L (ref 22–32)
Calcium: 7.4 mg/dL — ABNORMAL LOW (ref 8.9–10.3)
Chloride: 94 mmol/L — ABNORMAL LOW (ref 101–111)
Creatinine, Ser: 11.77 mg/dL — ABNORMAL HIGH (ref 0.61–1.24)
GFR, EST AFRICAN AMERICAN: 5 mL/min — AB (ref >60)
GFR, EST NON AFRICAN AMERICAN: 5 mL/min — AB (ref >60)
Glucose, Bld: 93 mg/dL (ref 65–99)
POTASSIUM: 3.6 mmol/L (ref 3.5–5.1)
SODIUM: 137 mmol/L (ref 135–145)

## 2016-10-30 LAB — GLUCOSE, CAPILLARY: GLUCOSE-CAPILLARY: 78 mg/dL (ref 65–99)

## 2016-10-30 LAB — SURGICAL PCR SCREEN
MRSA, PCR: NEGATIVE
STAPHYLOCOCCUS AUREUS: NEGATIVE

## 2016-10-30 LAB — PROTIME-INR
INR: 1.24
Prothrombin Time: 15.7 seconds — ABNORMAL HIGH (ref 11.4–15.2)

## 2016-10-30 SURGERY — ANGIOGRAM, LOWER EXTREMITY
Anesthesia: General | Site: Groin | Laterality: Right

## 2016-10-30 MED ORDER — PROTAMINE SULFATE 10 MG/ML IV SOLN
INTRAVENOUS | Status: DC | PRN
  Administered 2016-10-30: 50 mg via INTRAVENOUS

## 2016-10-30 MED ORDER — OXYCODONE HCL 5 MG PO TABS
ORAL_TABLET | ORAL | Status: AC
  Administered 2016-10-30: 10 mg via ORAL
  Filled 2016-10-30: qty 2

## 2016-10-30 MED ORDER — 0.9 % SODIUM CHLORIDE (POUR BTL) OPTIME
TOPICAL | Status: DC | PRN
  Administered 2016-10-30: 1000 mL

## 2016-10-30 MED ORDER — NEOSTIGMINE METHYLSULFATE 10 MG/10ML IV SOLN
INTRAVENOUS | Status: DC | PRN
  Administered 2016-10-30: 2 mg via INTRAVENOUS

## 2016-10-30 MED ORDER — PROTAMINE SULFATE 10 MG/ML IV SOLN
INTRAVENOUS | Status: AC
  Filled 2016-10-30: qty 5

## 2016-10-30 MED ORDER — VANCOMYCIN HCL 500 MG IV SOLR
500.0000 mg | INTRAVENOUS | Status: DC
  Administered 2016-10-30: 500 mg via INTRAVENOUS
  Filled 2016-10-30: qty 500

## 2016-10-30 MED ORDER — HEPARIN 1000 UNIT/ML FOR PERITONEAL DIALYSIS
INTRAMUSCULAR | Status: DC | PRN
  Filled 2016-10-30: qty 5000

## 2016-10-30 MED ORDER — HEPARIN SODIUM (PORCINE) 1000 UNIT/ML IJ SOLN
INTRAMUSCULAR | Status: AC
  Filled 2016-10-30: qty 1

## 2016-10-30 MED ORDER — ROCURONIUM BROMIDE 10 MG/ML (PF) SYRINGE
PREFILLED_SYRINGE | INTRAVENOUS | Status: AC
  Filled 2016-10-30: qty 5

## 2016-10-30 MED ORDER — PROPOFOL 10 MG/ML IV BOLUS
INTRAVENOUS | Status: AC
  Filled 2016-10-30: qty 20

## 2016-10-30 MED ORDER — HEPARIN SODIUM (PORCINE) 5000 UNIT/ML IJ SOLN
5000.0000 [IU] | Freq: Three times a day (TID) | INTRAMUSCULAR | Status: DC
  Administered 2016-10-31 – 2016-11-04 (×3): 5000 [IU] via SUBCUTANEOUS
  Filled 2016-10-30 (×6): qty 1

## 2016-10-30 MED ORDER — FENTANYL CITRATE (PF) 100 MCG/2ML IJ SOLN
25.0000 ug | INTRAMUSCULAR | Status: DC | PRN
  Administered 2016-10-30 (×2): 50 ug via INTRAVENOUS

## 2016-10-30 MED ORDER — NEOSTIGMINE METHYLSULFATE 5 MG/5ML IV SOSY
PREFILLED_SYRINGE | INTRAVENOUS | Status: AC
  Filled 2016-10-30: qty 5

## 2016-10-30 MED ORDER — SODIUM CHLORIDE 0.9% FLUSH
3.0000 mL | Freq: Two times a day (BID) | INTRAVENOUS | Status: DC
Start: 2016-10-30 — End: 2016-11-14
  Administered 2016-10-30 – 2016-11-14 (×23): 3 mL via INTRAVENOUS

## 2016-10-30 MED ORDER — SODIUM CHLORIDE 0.9% FLUSH
3.0000 mL | INTRAVENOUS | Status: DC | PRN
  Administered 2016-10-31: 3 mL via INTRAVENOUS
  Filled 2016-10-30: qty 3

## 2016-10-30 MED ORDER — FENTANYL CITRATE (PF) 250 MCG/5ML IJ SOLN
INTRAMUSCULAR | Status: AC
  Filled 2016-10-30: qty 5

## 2016-10-30 MED ORDER — LIDOCAINE 2% (20 MG/ML) 5 ML SYRINGE
INTRAMUSCULAR | Status: DC | PRN
  Administered 2016-10-30: 60 mg via INTRAVENOUS

## 2016-10-30 MED ORDER — SODIUM CHLORIDE 0.9 % IV SOLN
INTRAVENOUS | Status: DC | PRN
  Administered 2016-10-30 (×2): via INTRAVENOUS

## 2016-10-30 MED ORDER — IODIXANOL 320 MG/ML IV SOLN
INTRAVENOUS | Status: DC | PRN
  Administered 2016-10-30: 80 mL via INTRAVENOUS

## 2016-10-30 MED ORDER — LIDOCAINE 2% (20 MG/ML) 5 ML SYRINGE
INTRAMUSCULAR | Status: AC
  Filled 2016-10-30: qty 5

## 2016-10-30 MED ORDER — PHENYLEPHRINE 40 MCG/ML (10ML) SYRINGE FOR IV PUSH (FOR BLOOD PRESSURE SUPPORT)
PREFILLED_SYRINGE | INTRAVENOUS | Status: AC
  Filled 2016-10-30: qty 10

## 2016-10-30 MED ORDER — GLYCOPYRROLATE 0.2 MG/ML IJ SOLN
INTRAMUSCULAR | Status: DC | PRN
  Administered 2016-10-30: 0.2 mg via INTRAVENOUS

## 2016-10-30 MED ORDER — SUCCINYLCHOLINE CHLORIDE 200 MG/10ML IV SOSY
PREFILLED_SYRINGE | INTRAVENOUS | Status: AC
  Filled 2016-10-30: qty 10

## 2016-10-30 MED ORDER — FENTANYL CITRATE (PF) 100 MCG/2ML IJ SOLN
INTRAMUSCULAR | Status: AC
  Administered 2016-10-30: 50 ug via INTRAVENOUS
  Filled 2016-10-30: qty 2

## 2016-10-30 MED ORDER — EPHEDRINE 5 MG/ML INJ
INTRAVENOUS | Status: AC
  Filled 2016-10-30: qty 10

## 2016-10-30 MED ORDER — SODIUM CHLORIDE 0.9 % IV SOLN
INTRAVENOUS | Status: DC | PRN
  Administered 2016-10-30: 08:00:00 500 mL

## 2016-10-30 MED ORDER — HEPARIN SODIUM (PORCINE) 1000 UNIT/ML IJ SOLN
INTRAMUSCULAR | Status: DC | PRN
  Administered 2016-10-30: 8500 [IU] via INTRAVENOUS

## 2016-10-30 MED ORDER — PROPOFOL 10 MG/ML IV BOLUS
INTRAVENOUS | Status: DC | PRN
  Administered 2016-10-30: 70 mg via INTRAVENOUS
  Administered 2016-10-30 (×2): 20 mg via INTRAVENOUS

## 2016-10-30 MED ORDER — SODIUM CHLORIDE 0.9 % IV SOLN
250.0000 mL | INTRAVENOUS | Status: DC | PRN
  Administered 2016-11-05: 07:00:00 via INTRAVENOUS

## 2016-10-30 MED ORDER — EPHEDRINE SULFATE-NACL 50-0.9 MG/10ML-% IV SOSY
PREFILLED_SYRINGE | INTRAVENOUS | Status: DC | PRN
  Administered 2016-10-30: 5 mg via INTRAVENOUS
  Administered 2016-10-30: 10 mg via INTRAVENOUS
  Administered 2016-10-30 (×2): 5 mg via INTRAVENOUS

## 2016-10-30 MED ORDER — MIDAZOLAM HCL 2 MG/2ML IJ SOLN
INTRAMUSCULAR | Status: AC
  Filled 2016-10-30: qty 2

## 2016-10-30 MED ORDER — CISATRACURIUM BESYLATE (PF) 10 MG/5ML IV SOLN
INTRAVENOUS | Status: DC | PRN
  Administered 2016-10-30: 10 mg via INTRAVENOUS

## 2016-10-30 SURGICAL SUPPLY — 51 items
ADH SKN CLS LQ APL DERMABOND (GAUZE/BANDAGES/DRESSINGS)
BAG BANDED W/RUBBER/TAPE 36X54 (MISCELLANEOUS) ×3 IMPLANT
BAG EQP BAND 135X91 W/RBR TAPE (MISCELLANEOUS) ×1
BAG SNAP BAND KOVER 36X36 (MISCELLANEOUS) IMPLANT
BALLN MUSTANG 4X60X75 (BALLOONS) ×3
BALLOON MUSTANG 4X60X75 (BALLOONS) IMPLANT
CANISTER SUCT 3000ML PPV (MISCELLANEOUS) ×3 IMPLANT
CATH ANGIO 5F BER2 65CM (CATHETERS) ×2 IMPLANT
CATH CXI SUPP ANG 2.6FR 150CM (MICROCATHETER) ×2 IMPLANT
CATH OMNI FLUSH .035X70CM (CATHETERS) ×2 IMPLANT
CATH QUICKCROSS SUPP .035X90CM (MICROCATHETER) ×2 IMPLANT
COVER BACK TABLE 60X90IN (DRAPES) ×2 IMPLANT
COVER DOME SNAP 22 D (MISCELLANEOUS) ×5 IMPLANT
COVER PROBE W GEL 5X96 (DRAPES) ×5 IMPLANT
DERMABOND ADHESIVE PROPEN (GAUZE/BANDAGES/DRESSINGS)
DERMABOND ADVANCED .7 DNX6 (GAUZE/BANDAGES/DRESSINGS) ×1 IMPLANT
DEVICE TORQUE KENDALL .025-038 (MISCELLANEOUS) ×2 IMPLANT
DRAPE FEMORAL ANGIO 80X135IN (DRAPES) ×1 IMPLANT
DRSG TEGADERM 2-3/8X2-3/4 SM (GAUZE/BANDAGES/DRESSINGS) ×2 IMPLANT
GAUZE SPONGE 2X2 8PLY STRL LF (GAUZE/BANDAGES/DRESSINGS) IMPLANT
GAUZE SPONGE 4X4 16PLY XRAY LF (GAUZE/BANDAGES/DRESSINGS) ×1 IMPLANT
GLOVE BIO SURGEON STRL SZ7 (GLOVE) ×3 IMPLANT
GLOVE BIOGEL PI IND STRL 7.5 (GLOVE) ×1 IMPLANT
GLOVE BIOGEL PI INDICATOR 7.5 (GLOVE) ×2
GOWN STRL REUS W/ TWL LRG LVL3 (GOWN DISPOSABLE) ×3 IMPLANT
GOWN STRL REUS W/TWL LRG LVL3 (GOWN DISPOSABLE) ×9
GUIDEWIRE ANGLED .035X150CM (WIRE) ×2 IMPLANT
GUIDEWIRE ANGLED .035X260CM (WIRE) ×2 IMPLANT
KIT BASIN OR (CUSTOM PROCEDURE TRAY) ×3 IMPLANT
KIT ENCORE 26 ADVANTAGE (KITS) ×2 IMPLANT
KIT ROOM TURNOVER OR (KITS) ×3 IMPLANT
NDL PERC 18GX7CM (NEEDLE) ×1 IMPLANT
NEEDLE PERC 18GX7CM (NEEDLE) IMPLANT
NS IRRIG 1000ML POUR BTL (IV SOLUTION) ×3 IMPLANT
PACK PERIPHERAL VASCULAR (CUSTOM PROCEDURE TRAY) ×2 IMPLANT
PACK SURGICAL SETUP 50X90 (CUSTOM PROCEDURE TRAY) ×1 IMPLANT
PAD ARMBOARD 7.5X6 YLW CONV (MISCELLANEOUS) ×6 IMPLANT
PROTECTION STATION PRESSURIZED (MISCELLANEOUS) ×3
SHEATH AVANTI 11CM 5FR (MISCELLANEOUS) ×3 IMPLANT
SHEATH BRITE TIP 6FR 35CM (SHEATH) ×2 IMPLANT
SPONGE GAUZE 2X2 STER 10/PKG (GAUZE/BANDAGES/DRESSINGS) ×2
STATION PROTECTION PRESSURIZED (MISCELLANEOUS) ×1 IMPLANT
STOPCOCK MORSE 400PSI 3WAY (MISCELLANEOUS) IMPLANT
SYR 10ML LL (SYRINGE) ×7 IMPLANT
SYR 20CC LL (SYRINGE) ×1 IMPLANT
TAPE VIPERTRACK RADIOPAQ 30X (MISCELLANEOUS) IMPLANT
TAPE VIPERTRACK RADIOPAQUE (MISCELLANEOUS) ×3
WIRE BENTSON .035X145CM (WIRE) ×3 IMPLANT
WIRE MINI STICK MAX (SHEATH) ×3 IMPLANT
WIRE SPARTACORE .014X190CM (WIRE) ×2 IMPLANT
WIRE SPARTACORE .014X300CM (WIRE) ×4 IMPLANT

## 2016-10-30 NOTE — H&P (View-Only) (Signed)
Patient name: Hayden [name] MRN: [id number] DOB: [birth year] Sex: male  REASON FOR CONSULT: wound right foot, consult is from Dr. Posey Pronto.   HPI: Hayden [name] is a 46 y.o. male ESRD who presents with two week history of worsening right foot wound. This started after he got his foot stuck in the track of the back seat of his sister's minivan. He denies any issues with non healing wounds in the past. He is known to Hayden [name] from having undergone prior left radiocephalic arteriovenous fistula creation on 01/26/17 by Dr. Bridgett Larsson. He is currently on peritoneal dialysis. He reports having decreased sensation to the right foot compared to the left.   His PMH includes ESRD on PD, hypertension, hyperlipidemia and CVA. He is not diabetic. He is a current every day smoker.   Past Medical History:  Diagnosis Date  . Anemia March 2014  . Cancer ([place])    Kidney  . CHF (congestive heart failure) ([place]) 9163   Acute systolic and diastolic CHF  . Depression    & rage --  was in counseling....great now  . Diabetes mellitus    NO DM SINCE LOST 130LBS  . History of kidney cancer   . Hyperlipidemia   . Hypertension   . Obesity   . PONV (postoperative nausea and vomiting)   . Renal disorder   . Stroke [facility]     Family History  Problem Relation Age of Onset  . Heart disease Mother     Heart Disease before age 38  . Deep vein thrombosis Father   . Heart attack Father   . Other Other     SOCIAL HISTORY: Social History   Social History  . Marital status: Married    Spouse name: N/A  . Number of children: N/A  . Years of education: N/A   Occupational History  . [employer]    Social History Main Topics  . Smoking status: Current Every Day Smoker    Packs/day: 0.25    Years: 10.00    Types: Cigarettes  . Smokeless tobacco: Never Used  . Alcohol use No     Comment: "6 pack occ."-- lasts him six months-STOPPED 2 YRS AGO  . Drug use: Yes    Types: Marijuana     Comment: just in high  school   . Sexual activity: Yes    Birth control/ protection: None   Other Topics Concern  . Not on file   Social History Narrative   Positive for multiple uncles with uncontrolled hypertension on his mother's side.  One uncle on his mother's side at age 59 had a myocardial infarction and one at age 45 had a myocardial  infarction.    Allergies  Allergen Reactions  . Dilaudid [Hydromorphone Hcl] Other (See Comments)    Abnormal behavior, "verbal and physically abusive", "psychosis"  . Morphine And Related Itching    Syncopal episode    Current Facility-Administered Medications  Medication Dose Route Frequency Provider Last Rate Last Dose  . acetaminophen (TYLENOL) tablet 650 mg  650 mg Oral Q6H PRN Norval Morton, MD       Or  . acetaminophen (TYLENOL) suppository 650 mg  650 mg Rectal Q6H PRN Rondell A Tamala Julian, MD      . albuterol (PROVENTIL) (2.5 MG/3ML) 0.083% nebulizer solution 2.5 mg  2.5 mg Nebulization Q2H PRN Norval Morton, MD      . ALPRAZolam Duanne Moron) tablet 1 mg  1 mg Oral QHS PRN Rondell  Charmayne Sheer, MD   1 mg at 10/23/16 2336  . amLODipine (NORVASC) tablet 10 mg  10 mg Oral Daily Ripudeep Krystal Eaton, MD   10 mg at 10/24/16 0815  . aspirin EC tablet 81 mg  81 mg Oral Daily Rondell A Tamala Julian, MD      . calcitRIOL (ROCALTROL) capsule 0.5 mcg  0.5 mcg Oral Daily Rondell A Tamala Julian, MD      . calcium acetate (PHOSLO) capsule 1,334 mg  1,334 mg Oral TID WC Norval Morton, MD   1,334 mg at 10/24/16 0815  . cinacalcet (SENSIPAR) tablet 60 mg  60 mg Oral Q breakfast Norval Morton, MD   60 mg at 10/24/16 0815  . cloNIDine (CATAPRES - Dosed in mg/24 hr) patch 0.2 mg  0.2 mg Transdermal Weekly Norval Morton, MD   0.2 mg at 10/24/16 0001  . dialysis solution 2.5% low-MG/low-CA dianeal solution   Intraperitoneal 5 X Daily Dwana Melena, MD      . fentaNYL (SUBLIMAZE) injection 25 mcg  25 mcg Intravenous Q3H PRN Ripudeep K Rai, MD      . gentamicin cream (GARAMYCIN) 0.1 % 1 application  1  application Topical Daily Dwana Melena, MD      . heparin 1000 unit/ml injection 500 Units  500 Units Intraperitoneal PRN Dwana Melena, MD      . heparin injection 5,000 Units  5,000 Units Subcutaneous Q8H Norval Morton, MD   5,000 Units at 10/24/16 [phone]  . hydrALAZINE (APRESOLINE) injection 10 mg  10 mg Intravenous Q4H PRN Norval Morton, MD   10 mg at 10/24/16 0644  . hydrALAZINE (APRESOLINE) tablet 100 mg  100 mg Oral BID Norval Morton, MD   100 mg at 10/23/16 2335  . labetalol (NORMODYNE) tablet 150 mg  150 mg Oral BID Rondell A Tamala Julian, MD      . ondansetron (ZOFRAN) tablet 4 mg  4 mg Oral Q6H PRN Norval Morton, MD       Or  . ondansetron (ZOFRAN) injection 4 mg  4 mg Intravenous Q6H PRN Rondell A Tamala Julian, MD      . oxyCODONE (Oxy IR/ROXICODONE) immediate release tablet 5-10 mg  5-10 mg Oral Q4H PRN Rhetta Mura Schorr, NP   10 mg at 10/24/16 0815  . piperacillin-tazobactam (ZOSYN) IVPB 3.375 g  3.375 g Intravenous Q12H Leo Grosser, MD 12.5 mL/hr at 10/24/16 0641 3.375 g at 10/24/16 0641  . saccharomyces boulardii (FLORASTOR) capsule 250 mg  250 mg Oral BID Rondell A Tamala Julian, MD        REVIEW OF SYSTEMS:  [X]  denotes positive finding, [ ]  denotes negative finding Cardiac  Comments:  Chest pain or chest pressure:    Shortness of breath upon exertion:    Short of breath when lying flat:    Irregular heart rhythm:        Vascular    Pain in calf, thigh, or hip brought on by ambulation:    Pain in feet at night that wakes you up from your sleep:     Blood clot in your veins:    Leg swelling:         Pulmonary    Oxygen at home:    Productive cough:     Wheezing:         Neurologic    Sudden weakness in arms or legs:     Sudden numbness in arms or legs:     Sudden  onset of difficulty speaking or slurred speech:    Temporary loss of vision in one eye:     Problems with dizziness:         Gastrointestinal    Blood in stool:     Vomited blood:         Genitourinary    Burning  when urinating:     Blood in urine:        Psychiatric    Major depression:         Hematologic    Bleeding problems:    Problems with blood clotting too easily:        Skin    Rashes or ulcers:        Constitutional    Fever or chills:      PHYSICAL EXAM: Vitals:   10/23/16 2144 10/23/16 2306 10/24/16 0450 10/24/16 0810  BP: (!) 184/93 (!) 185/99 (!) 196/98 (!) 197/105  Pulse: 88  87 91  Resp:   19 18  Temp: 98.4 F (36.9 C)  98.6 F (37 C) 98.2 F (36.8 C)  TempSrc: Oral  Oral Oral  SpO2:   92% 94%  Weight:      Height:        GENERAL: The patient is a well-nourished male, in no acute distress. The vital signs are documented above. CARDIAC: There is a regular rate and rhythm. Holosystolic murmur thoughout all listening areas VASCULAR: Unable to discern presence of pedal pulse, due to patient twitching. Feet are warm bilaterally. Dry gangrenous changes appear superficial and located around medial edge of right great toe nail. Multiple areas of eschar to dorsum of foot. Some mild erythema of right great toe.  PULMONARY: There is good air exchange bilaterally without wheezing or rales. ABDOMEN: Soft and non-tender. PD cath in place.  MUSCULOSKELETAL: There are no major deformities or cyanosis. NEUROLOGIC: CN grossly intact, decreased sensation in R feet, Motor grossly intact throughout SKIN: There are no ulcers or rashes noted. HEAD: [place]/AT EAR/NOSE/THROAT: Hearing grossly intact, nares without erythema or drainage, oropharynx with Erythema w/o Exudate, Mallampati score: 3 NECK: Supple, no nuchal rigidity, palpable LAD PSYCHIATRIC: Judgment intact, Mood & affect appropriate for pt's clinical situation LYMPH:  No Cervical, Axillary, or Inguinal lymphadenopathy   DATA:  ABIs pending  Radiology   Dg Foot Complete Right  Result Date: 10/23/2016 CLINICAL DATA:  Recent foot injury 2 weeks ago with persistent pain and discoloration EXAM: RIGHT FOOT COMPLETE - 3+ VIEW  COMPARISON:  None. FINDINGS: No acute fracture or dislocation is noted. Diffuse vascular calcifications are seen. IMPRESSION: No acute abnormality noted. Electronically Signed   By: Inez Catalina M.D.   On: 10/23/2016 17:50   Laboratory: CBC:    Component Value Date/Time   WBC 9.9 10/24/2016 0458   RBC 4.16 (L) 10/24/2016 0458   HGB 10.4 (L) 10/24/2016 0458   HCT 32.5 (L) 10/24/2016 0458   PLT 435 (H) 10/24/2016 0458   MCV 78.1 10/24/2016 0458   MCH 25.0 (L) 10/24/2016 0458   MCHC 32.0 10/24/2016 0458   RDW 19.2 (H) 10/24/2016 0458   LYMPHSABS 0.8 10/23/2016 1720   MONOABS 0.9 10/23/2016 1720   EOSABS 0.5 10/23/2016 1720   BASOSABS 0.2 (H) 10/23/2016 1720    BMP:    Component Value Date/Time   NA 138 10/24/2016 0458   K 4.6 10/24/2016 0458   CL 98 (L) 10/24/2016 0458   CO2 21 (L) 10/24/2016 0458   GLUCOSE 70 10/24/2016 0458  BUN 96 (H) 10/24/2016 0458   CREATININE 13.22 (H) 10/24/2016 0458   CALCIUM 7.9 (L) 10/24/2016 0458   GFRNONAA 4 (L) 10/24/2016 0458   GFRAA 5 (L) 10/24/2016 0458    Coagulation: Lab Results  Component Value Date   INR 1.36 11/04/2015   INR 1.0 12/20/2008   No results found for: PTT  Lipids:    Component Value Date/Time   CHOL 194 06/20/2015 0810   TRIG 188 (H) 06/20/2015 0810   HDL 31 (L) 06/20/2015 0810   CHOLHDL 6.3 06/20/2015 0810   VLDL 38 06/20/2015 0810   LDLCALC 125 (H) 06/20/2015 0810    MEDICAL ISSUES: 1. Slowly healing wound right foot 2. ESRD 3. Possible aortic stenosis 4. CHF  Will obtain ABIs to evaluate baseline circulation. Based on these results, will see if he requires any vascular revascularization. Recommend consult to Dr. Sharol Given.    Virgina Jock, PA-C [facility] [phone]   Addendum  I have independently interviewed and examined the patient, and I agree with the physician assistant's findings.  The patient has warm feet without strong pedal pulses. R great toe looks  somewhat ischemic with some superficial black eschar vs hematoma.  Would obtain BLE ABI to get an evaluation of the blood flow in both feet.  I doubt he would tolerate a toe pressure on the right side.    - would get Dr. Sharol Given to evaluate the right foot as the great toe may not be salvageable - pt may need a right leg angiogam eventually but would start with non-invasive studies - will continue to follow with you  Adele Barthel, MD, FACS [facility] [place]: [phone] Pager: [phone]  10/24/2016, 10:57 AM

## 2016-10-30 NOTE — Anesthesia Postprocedure Evaluation (Addendum)
Anesthesia Post Note  Patient: Hayden [name]  Procedure(s) Performed: Procedure(s) (LRB): Right  LOWER EXTREMITY ANGIOGRAM WITH RIGHT SUPERFICIAL FEMORAL ARTERY balloon angioplasty (Right)  Patient location during evaluation: PACU Anesthesia Type: General Level of consciousness: awake and alert Pain management: pain level controlled Vital Signs Assessment: post-procedure vital signs reviewed and stable Respiratory status: spontaneous breathing, nonlabored ventilation, respiratory function stable and patient connected to nasal cannula oxygen Cardiovascular status: blood pressure returned to baseline and stable Postop Assessment: no signs of nausea or vomiting Anesthetic complications: no       Last Vitals:  Vitals:   10/30/16 1115 10/30/16 1130  BP: 138/80   Pulse: 77 75  Resp: 13 13  Temp:      Last Pain:  Vitals:   10/30/16 0630  TempSrc: Oral  PainSc:                  Shernita Rabinovich

## 2016-10-30 NOTE — Progress Notes (Signed)
   Daily Progress Note  Attempted endovascular revascularization of R leg.  Unfortunately, chronic calcification of the popliteal artery made this impossible, as I could not get re-entry into the popliteal artery despite crossing the chronic total occlusion.  The heavy calcification in this above-the-knee popliteal artery segment makes this a poor bypass target.  I would consider below-the-knee vs peroneal artery as a distal target.  - Preop Cardiology risk stratification and optimization: would not be surprised if stress testing and cardiac catheterization becomes necessary given his co-morbidities and family history of premature cardiac disease  - Will aim for surgical revascularization next week: Tues   Adele Barthel, MD, FACS [facility]: [phone] Pager: [phone]  10/30/2016, 10:19 AM
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
## 2016-10-30 NOTE — Op Note (Signed)
OPERATIVE NOTE   PROCEDURE: 1.  Antegrade right common femoral artery cannulation under ultrasound guidance 2.  Right leg runoff 3.  Angioplasty right superficial femoral artery (4 mm x 60 mm)  PRE-OPERATIVE DIAGNOSIS: right foot gangrene  POST-OPERATIVE DIAGNOSIS: same as above   SURGEON: Adele Barthel, MD  ANESTHESIA: general  ESTIMATED BLOOD LOSS: 30 cc  CONTRAST: 80 cc  FINDING(S): 1.  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  Unable to re-enter popliteal artery 3.  Persistent collateral filling of diseased above the knee popliteal artery: unsuitable distal target 4.  Unchanged distal tibial artery disease: peroneal artery only runoff  SPECIMEN(S):  none  INDICATIONS:   Hayden [name] is a 46 y.o. male who presents with right foot gangrene.  He recently underwent a diagnostic angiogram, which demonstrated right superficial femoral artery occlusion that was heavily calcified.  Due to an involuntary leg movement disorder, he was unable to stay still.  I felt attempt at recannulation of the right superficial femoral artery under general anesthesia was recommended.  The patient presents for: right leg runoff, possible superficial femoral artery intervention.  I discussed with the patient the nature of angiographic procedures, especially the limited patencies of any endovascular intervention.  The patient is aware of that the risks of an angiographic procedure include but are not limited to: bleeding, infection, access site complications, renal failure, embolization, rupture of vessel, dissection, possible need for emergent surgical intervention, possible need for surgical procedures to treat the patient's pathology, and stroke and death.  The patient is aware of the risks and agrees to proceed.  DESCRIPTION: After full informed consent was obtained from the patient, the patient was brought back to the operating room.  After  obtaining adequate general anesthesia, he was prepped for an angiographic procedure.  Under ultrasound guidance, the subcutaneous tissue surrounding the right common femoral artery was anesthesized with 1% lidocaine with epinephrine.  The distal common femoral artery was then cannulated with a micropuncture needle.  The microwire was advanced into the profunda femoral artery.  Despite manipulations, I could not get the wire to select the superficial femoral artery.  The needle was exchanged for a microsheath, which was loaded into the profunda femoral artery over the wire.  The microwire was exchanged for a Bentson wire which was advanced into the profunda femoral artery.  The microsheath was then exchanged for a 5-Fr sheath which was loaded into the profunda femoral artery.  I had a hand injection to image the femoral bifurcation.  I pulled the sheath back into the common femoral artery.  I then used a Glidewire with a BER-2 catheter to select the superficial femoral artery.  I advanced the wire into the mid-superficial femoral artery.  The catheter was removed and then the sheath exchanged for a long 6-Fr sheath which was advanced into the mid-to-distal superficial femoral artery.  The patient was given 8500 units of Heparin intravenously, which was a therapeutic bolus.   At this point, I did a hand injection to image the superficial femoral artery chronic total occlusion.  I placed a Quickcross catheter over a long Glidewire.  Using this combination, I was able to dissect in an subintimal plane around the chronic total occlusion half way through the length of the occlusion.  I placed a 4 mm x 60 mm balloon over the wire and inflated it to profile at 10 atm for 1 minute.  This did not appear to appreciable change the  dissection plane.  It did however allow me to dissect more distally.  I was able to get past the chronic total occlusion.  Unfortunately, on hand injection, there was a developing spiral dissection  in the above-the-knee popliteal artery.  I spent another 15 minutes trying to re-enter but the heavy calcification in the artery made this impossible.  The staining from the hand injections also demonstrates some extravascular leak.  I removed all catheters and wires and did a hand injection via the sheath.  There was no active bleeding.  The above-the-knee popliteal artery appeared to continue to reconstitute via profunda femoral artery collaterals.  The below-the-knee popliteal artery and tibials were completely unchanged.  I gave the patient 50 mg of Protamine to reverse anticoagulation.  After waiting a few minutes, an ACT was obtained.  Once therapeutic reversal was demonstrated, the sheath was removed and pressure was held to the right groin for 20 minutes.  A sterile bandage was applied to the right groin puncture site.  The plan is pre-operative cardiac risk stratification and optimization, followed by likely femoropopliteal via femoroperoneal bypass next week.   COMPLICATIONS: none  CONDITION: stable   Adele Barthel, MD, [facility] [place] Office: [phone] Pager: [phone]  10/30/2016, 10:02 AM

## 2016-10-30 NOTE — Anesthesia Procedure Notes (Signed)
Procedure Name: Intubation Date/Time: 10/30/2016 8:49 AM Performed by: Julieta Bellini Pre-anesthesia Checklist: Patient identified, Emergency Drugs available, Suction available and Patient being monitored Patient Re-evaluated:Patient Re-evaluated prior to inductionOxygen Delivery Method: Circle system utilized Preoxygenation: Pre-oxygenation with 100% oxygen Intubation Type: IV induction Ventilation: Mask ventilation without difficulty and Oral airway inserted - appropriate to patient size Laryngoscope Size: Mac and 4 Grade View: Grade I Tube type: Oral Tube size: 7.5 mm Number of attempts: 1 Airway Equipment and Method: Stylet Placement Confirmation: ETT inserted through vocal cords under direct vision,  positive ETCO2 and breath sounds checked- equal and bilateral Secured at: 24 cm Tube secured with: Tape Dental Injury: Teeth and Oropharynx as per pre-operative assessment

## 2016-10-30 NOTE — Anesthesia Preprocedure Evaluation (Addendum)
Anesthesia Evaluation  Patient identified by MRN, date of birth, ID band Patient awake    Reviewed: Allergy & Precautions, NPO status , Patient's Chart, lab work & pertinent test results  History of Anesthesia Complications (+) PONV and history of anesthetic complications  Airway Mallampati: II  TM Distance: >3 FB Neck ROM: Full    Dental  (+) Teeth Intact, Dental Advisory Given   Pulmonary neg shortness of breath, neg sleep apnea, neg COPD, neg recent URI, Current Smoker,    breath sounds clear to auscultation       Cardiovascular hypertension, Pt. on medications and Pt. on home beta blockers +CHF   Rhythm:Regular + Systolic murmurs    Neuro/Psych PSYCHIATRIC DISORDERS Depression CVA    GI/Hepatic GERD  Medicated,  Endo/Other  diabetes  Renal/GU ESRF and DialysisRenal disease     Musculoskeletal   Abdominal   Peds  Hematology  (+) anemia ,   Anesthesia Other Findings   Reproductive/Obstetrics                            Anesthesia Physical Anesthesia Plan  ASA: III  Anesthesia Plan: General   Post-op Pain Management:    Induction: Intravenous  Airway Management Planned: Oral ETT  Additional Equipment: None  Intra-op Plan:   Post-operative Plan: Extubation in OR and Possible Post-op intubation/ventilation  Informed Consent: I have reviewed the patients History and Physical, chart, labs and discussed the procedure including the risks, benefits and alternatives for the proposed anesthesia with the patient or authorized representative who has indicated his/her understanding and acceptance.   Dental advisory given  Plan Discussed with: CRNA, Surgeon and Anesthesiologist  Anesthesia Plan Comments:        Anesthesia Quick Evaluation

## 2016-10-30 NOTE — Progress Notes (Signed)
Patient refusing Peritoneal Dialysis treatment this PM, stating "I just found out that I only have 8 months left to live, I need time to think about things."  This RN did not find any recent physician notes indicating patients life expectancy.  Bedside RN notified, Nephrology notified, AMA form signed and in patient chart.

## 2016-10-30 NOTE — Transfer of Care (Signed)
Immediate Anesthesia Transfer of Care Note  Patient: Hayden [name]  Procedure(s) Performed: Procedure(s): Right  LOWER EXTREMITY ANGIOGRAM WITH RIGHT SUPERFICIAL FEMORAL ARTERY balloon angioplasty (Right)  Patient Location: PACU  Anesthesia Type:General  Level of Consciousness: awake, alert  and patient cooperative  Airway & Oxygen Therapy: Patient Spontanous Breathing and Patient connected to nasal cannula oxygen  Post-op Assessment: Report given to RN, Post -op Vital signs reviewed and stable and Patient moving all extremities X 4  Post vital signs: Reviewed and stable  Last Vitals:  Vitals:   10/30/16 0546 10/30/16 0630  BP: (!) 160/83 (!) 156/63  Pulse: 74 82  Resp: 18 18  Temp: 36.6 C 36.4 C    Last Pain:  Vitals:   10/30/16 0630  TempSrc: Oral  PainSc:       Patients Stated Pain Goal: 0 (84/72/07 2182)  Complications: No apparent anesthesia complications

## 2016-10-30 NOTE — Interval H&P Note (Signed)
   History and Physical Update  The patient was interviewed and re-examined.  The patient's previous History and Physical has been reviewed and is unchanged from my consult except for: interval angiogram which demonstrated single vessel runoff and R SFA occlusion.  Due involuntary twitching, pt was not an intervention candidate in the PV lab.  The plan today is general anesthesia and R antegrade femoral cannulation, R leg runoff, and possible recannulation of R SFA.   I discussed with the patient the nature of angiographic procedures, especially the limited patencies of any endovascular intervention.    The patient is aware of that the risks of an angiographic procedure include but are not limited to: bleeding, infection, access site complications, renal failure, embolization, rupture of vessel, dissection, arteriovenous fistula, possible need for emergent surgical intervention, possible need for surgical procedures to treat the patient's pathology, anaphylactic reaction to contrast, and stroke and death.    The patient is aware of the risks and agrees to proceed.  We discussed that no surgical intervention is planned today, as his preop cardiac optimization has not been completed.   Adele Barthel, MD, FACS [facility] Office: [phone] Pager: [phone]  10/30/2016, 7:30 AM

## 2016-10-30 NOTE — Progress Notes (Signed)
[facility] Progress Note   Subjective: "I just don't understand why they can't get this done..." Discussed procedure with wife-pt still lethargic. No C/Os at present.    Objective Vitals:   10/30/16 1130 10/30/16 1145 10/30/16 1158 10/30/16 1222  BP:   131/63 131/63  Pulse: 75 74 73 73  Resp: 13 14 16    Temp:   98.5 F (36.9 C)   TempSrc:   Oral   SpO2: 95% 95% 95%   Weight:      Height:       Physical Exam General: Chronically ill appearing male in NAD Heart: Z0,Y1, 3/6 systolic M-? Hearing bruit from AVF? Lungs: BBS CTAB Abdomen: soft, non-tender, active BS Extremities: No LE edema. Ulcers R great toe with dry gangrenous changes. open to air.  Dialysis Access:  LFA AVF   Additional Objective Labs: Basic Metabolic Panel:  Recent Labs Lab 10/28/16 0444 10/29/16 0524 10/30/16 0614  NA 140 138 137  K 3.7 3.7 3.6  CL 98* 97* 94*  CO2 27 25 26   GLUCOSE 102* 86 93  BUN 62* 55* 54*  CREATININE 11.88* 11.46* 11.77*  CALCIUM 7.0* 7.2* 7.4*  PHOS  --  9.1*  --    Liver Function Tests:  Recent Labs Lab 10/23/16 1720 10/24/16 0458 10/28/16 0444 10/29/16 0524  AST 28 22 22   --   ALT 49 43 30  --   ALKPHOS 264* 243* 208*  --   BILITOT 1.2 1.2 0.7  --   PROT 6.4* 6.0* 5.8*  --   ALBUMIN 2.6* 2.5* 2.0* 2.0*  CBC:  Recent Labs Lab 10/23/16 1720 10/24/16 0458 10/26/16 0324 10/27/16 0454 10/28/16 0444 10/30/16 0614  WBC 14.6* 9.9 14.5* 14.4* 14.2* 13.2*  NEUTROABS 12.3*  --   --   --   --   --   HGB 11.3* 10.4* 10.3* 9.8* 10.1* 10.4*  HCT 35.3* 32.5* 33.3* 31.5* 31.5* 33.0*  MCV 77.4* 78.1 78.9 78.8 79.5 79.1  PLT 427* 435* 469* 501* 515* 588*   Blood Culture    Component Value Date/Time   SDES BLOOD RIGHT ANTECUBITAL 10/23/2016 1826   SPECREQUEST  10/23/2016 1826    BOTTLES DRAWN AEROBIC AND ANAEROBIC Blood Culture adequate volume   CULT NO GROWTH 5 DAYS 10/23/2016 1826   REPTSTATUS 10/28/2016 FINAL 10/23/2016 1826     CBG:  Recent Labs Lab 10/24/16 2126 10/30/16 1050  GLUCAP 102* 78   Studies/Results: No results found. Medications: . sodium chloride    . dialysis solution 1.5% low-MG/low-CA    . piperacillin-tazobactam (ZOSYN)  IV 3.375 g (10/30/16 1233)  . vancomycin     . ALPRAZolam  1 mg Oral BID  . amLODipine  10 mg Oral Daily  . aspirin EC  81 mg Oral Daily  . buPROPion  150 mg Oral Daily   And  . buPROPion  300 mg Oral QHS  . calcitRIOL  0.5 mcg Oral Daily  . calcium acetate  1,334 mg Oral TID WC  . cinacalcet  60 mg Oral Q breakfast  . cloNIDine  0.2 mg Transdermal Weekly  . darbepoetin (ARANESP) injection - NON-DIALYSIS  60 mcg Subcutaneous Q Sun-1800  . feeding supplement (NEPRO CARB STEADY)  237 mL Oral BID BM  . feeding supplement (PRO-STAT SUGAR FREE 64)  30 mL Oral BID  . gentamicin cream  1 application Topical Daily  . heparin  5,000 Units Subcutaneous Q8H  . hydrALAZINE  100 mg Oral BID  . isosorbide  dinitrate  5 mg Oral BID  . labetalol  200 mg Oral BID  . pantoprazole  40 mg Oral Daily  . PARoxetine  40 mg Oral Daily  . pravastatin  20 mg Oral Daily  . saccharomyces boulardii  250 mg Oral BID  . sodium chloride flush  3 mL Intravenous Q12H  . Vitamin D (Ergocalciferol)  50,000 Units Oral Q7 days  . zolpidem  5 mg Oral QHS     Dialysis: [facility].  CCPD: 3L fill 5 exchanges at night with 5th fill manually drained 1-1/2 hours later. Uses mostly 1's and 2's at home.       Assessment: 1. Ischmic ulcers R foot/PAD: sp a-gram, Went for R RFA PTCA today however unsuccessful D/T heavy calcification. Plan for surgical intervention possibly next week.  2. ESRD -Continue PD. K+ 3.6 3. Anemia -HGB 10.4. rec'd Aranesp 60 mcg IV 10/27/16. Follow HGB.  4. Secondary hyperparathyroidism - Continue VDRA, calcimimetic, binders.  5. HTN - continues on 4 BP meds. Reasonable control of BP at present.  6. Volume - no vol excess on exam , wt's stable and down 5-7kg  from admission 6. Nutrition - Albumin 2.0 Clear liquid diet at present. Carb Mod diet, prostat when able to eat. 7. DM: per primary  Plan - cont CCPD, all 1.5 %\  Rita H. Brown NP-C 10/30/2016, 3:20 PM  [facility] [phone]  Pt seen, examined and agree w A/P as above.  Kelly Splinter MD [employer] pager [phone]   10/30/2016, 5:02 PM

## 2016-10-30 NOTE — Progress Notes (Signed)
PROGRESS NOTE    Hayden [name]  MVE:[id number] DOB: August 12, 1970 DOA: 10/23/2016 PCP: Louis Meckel, MD    Brief Narrative:  46 y.o.malewith medical history significant of ESRD on home peritoneal dialysis, HTN, CVA; who presents with complaints of right foot redness and swelling. The initial injury happened over one month ago when his foot caught in the track of the back seat of his sisters mini [name]. He initially triedto keep the area clean and washed. However, develop redness, swelling and pain. Approximately 2 weeks ago he was seen at the emergency department and was given 1 dose of vancomycin,but ended up leaving AMA. Patient saw his PCP who prescribed clindamycin, patient took for last 12-13 days however states that the pain continued to get worse and with the drainage from the wound.    Assessment & Plan:   Principal Problem:   Cellulitis of great toe of right foot Active Problems:   Hypertensive urgency, malignant   End stage renal disease on dialysis (HCC)   Anxiety and depression   Leukocytosis   Hyperkalemia   Pressure injury of skin   Gangrene of right foot (HCC)  Cellulitis of the right foot/Right great toe:  - failed treatment with outpatient antibiotics of clindamycin - Continue empiric antibiotics of vancomycin and Zosyn - Vascular surgery consulted, Arteriogram showed R SFA occlusion, unfortunately was not able to remain still for intervention. Per vascular surgery, may need to consider right BKA versus AKA if endovascular procedure to right SFA or possible bypass grafting not successful  - Patient status post anterograde right, femoral artery cannulation under ultrasound guidance, right leg runoff, angioplasty right superficial femoral artery per Dr. Bridgett Larsson 10/30/2016. -Appreciate Dr. Jess Barters evaluation -Continue current pain control management   Active problems Hypertensive urgency: Blood pressures noted to be as high as 240/110 on admission. Patient  noted not recently taking home blood pressure medications  - Blood pressure improved on current regimen of Norvasc, clonidine patch, hydralazine, labetalol   Leukocytosis: WBC elevated at 14.6. Suspect secondary to above. - continue IV antibiotics per pharmacy  ESRD on peritoneal dialysis: Patient receives nightly peritoneal pulses dialysis. Initial BUN 102, creatinine 13.79. - Nephrology consulted for peritoneal dialysis - Continue Sensipar, calcitriol, andPhosLo - Per nephrology.  Hyperkalemia:Acute. Initial potassium 5.4 on admission. - Resolved  Elevated anion gap likely secondary to uremia.   Elevatedalkaline phosphatase - Improving  Anxiety and depression - Anxiety improving, continue increased Xanax  dose.   Prolonged QTC - Avoid QTC prolonging medications, discontinued Lexapro. Repeat EKG tomorrow.   DVT prophylaxis: Heparin Code Status: Full Family Communication: Updated patient and family at bedside. Disposition Plan: Per vascular surgery.   Consultants:   Cardiology pending  Orthopedics: Dr. Sharol Given 10/28/2016  Vascular surgery: Dr. Bridgett Larsson 10/24/2016  Nephrology: Dr. Jeani Hawking 10/23/2016    Procedures:   Antegrade right common femoral artery cannulation under ultrasound guidance, right leg runoff, angioplasty right superficial femoral artery per Dr. Bridgett Larsson 10/30/2016  Plain xray right foot 10/23/2016  Lower extremity vein mapping 10/28/2016  Lower extremity arterial duplex 10/25/2016  ABIs 10/25/2016--attempted  Abdominal aortogram with lower extremity 10/28/2016 Dr. Doren Custard  Antimicrobials:   Gentamicin cream 10/23/2016  IV Zosyn 10/23/2016  IV vancomycin 10/23/2016   Subjective: Patient post anterograde right, femoral artery cannulation, right leg runoff, angioplasty right superficial femoral artery per Dr. Bridgett Larsson. Patient denies any shortness of breath. Patient denies any chest pain. Patient states he's feeling different after anesthesia.  Patient complaining of severe pain in the right leg.  Objective:  Vitals:   10/30/16 1130 10/30/16 1145 10/30/16 1158 10/30/16 1222  BP:   131/63 131/63  Pulse: 75 74 73 73  Resp: 13 14 16    Temp:   98.5 F (36.9 C)   TempSrc:   Oral   SpO2: 95% 95% 95%   Weight:      Height:        Intake/Output Summary (Last 24 hours) at 10/30/16 1338 Last data filed at 10/30/16 1231  Gross per 24 hour  Intake            12778 ml  Output            14731 ml  Net            -1953 ml   Filed Weights   10/29/16 2000 10/29/16 2134 10/30/16 0630  Weight: 88.1 kg (194 lb 3.6 oz) 90.2 kg (198 lb 14.4 oz) 84.1 kg (185 lb 6.5 oz)    Examination:  General exam: Appears calm and comfortable  Respiratory system: Clear to auscultation. Respiratory effort normal. Cardiovascular system: S1 & S2 heard, RRR. No JVD, murmurs, rubs, gallops or clicks. No pedal edema. Gastrointestinal system: Abdomen is nondistended, soft and nontender. No organomegaly or masses felt. Normal bowel sounds heard. Central nervous system: Alert and oriented. No focal neurological deficits. Extremities: Right great toe with dry gangrenous ulcer. Skin: No rashes, lesions or ulcers Psychiatry: Judgement and insight appear fair -normal. Mood & affect appropriate.     Data Reviewed: I have personally reviewed following labs and imaging studies  CBC:  Recent Labs Lab 10/23/16 1720 10/24/16 0458 10/26/16 0324 10/27/16 0454 10/28/16 0444 10/30/16 0614  WBC 14.6* 9.9 14.5* 14.4* 14.2* 13.2*  NEUTROABS 12.3*  --   --   --   --   --   HGB 11.3* 10.4* 10.3* 9.8* 10.1* 10.4*  HCT 35.3* 32.5* 33.3* 31.5* 31.5* 33.0*  MCV 77.4* 78.1 78.9 78.8 79.5 79.1  PLT 427* 435* 469* 501* 515* 193*   Basic Metabolic Panel:  Recent Labs Lab 10/26/16 0324 10/27/16 0454 10/28/16 0444 10/29/16 0524 10/30/16 0614  NA 138 140 140 138 137  K 4.2 3.8 3.7 3.7 3.6  CL 98* 96* 98* 97* 94*  CO2 21* 25 27 25 26   GLUCOSE 98 126* 102* 86 93    BUN 82* 71* 62* 55* 54*  CREATININE 12.15* 11.88* 11.88* 11.46* 11.77*  CALCIUM 7.3* 7.3* 7.0* 7.2* 7.4*  PHOS  --   --   --  9.1*  --    GFR: Estimated Creatinine Clearance: 8.3 mL/min (A) (by C-G formula based on SCr of 11.77 mg/dL (H)). Liver Function Tests:  Recent Labs Lab 10/23/16 1720 10/24/16 0458 10/28/16 0444 10/29/16 0524  AST 28 22 22   --   ALT 49 43 30  --   ALKPHOS 264* 243* 208*  --   BILITOT 1.2 1.2 0.7  --   PROT 6.4* 6.0* 5.8*  --   ALBUMIN 2.6* 2.5* 2.0* 2.0*   No results for input(s): LIPASE, AMYLASE in the last 168 hours. No results for input(s): AMMONIA in the last 168 hours. Coagulation Profile:  Recent Labs Lab 10/30/16 0614  INR 1.24   Cardiac Enzymes: No results for input(s): CKTOTAL, CKMB, CKMBINDEX, TROPONINI in the last 168 hours. BNP (last 3 results) No results for input(s): PROBNP in the last 8760 hours. HbA1C: No results for input(s): HGBA1C in the last 72 hours. CBG:  Recent Labs Lab 10/24/16 2126 10/30/16 1050  GLUCAP  102* 78   Lipid Profile: No results for input(s): CHOL, HDL, LDLCALC, TRIG, CHOLHDL, LDLDIRECT in the last 72 hours. Thyroid Function Tests: No results for input(s): TSH, T4TOTAL, FREET4, T3FREE, THYROIDAB in the last 72 hours. Anemia Panel: No results for input(s): VITAMINB12, FOLATE, FERRITIN, TIBC, IRON, RETICCTPCT in the last 72 hours. Sepsis Labs:  Recent Labs Lab 10/23/16 1726 10/23/16 2108  LATICACIDVEN 0.73 0.45*    Recent Results (from the past 240 hour(s))  Blood culture (routine x 2)     Status: None   Collection Time: 10/23/16  5:20 PM  Result Value Ref Range Status   Specimen Description BLOOD RIGHT ANTECUBITAL  Final   Special Requests   Final    BOTTLES DRAWN AEROBIC AND ANAEROBIC Blood Culture adequate volume   Culture NO GROWTH 5 DAYS  Final   Report Status 10/28/2016 FINAL  Final  Blood culture (routine x 2)     Status: None   Collection Time: 10/23/16  6:26 PM  Result Value Ref  Range Status   Specimen Description BLOOD RIGHT ANTECUBITAL  Final   Special Requests   Final    BOTTLES DRAWN AEROBIC AND ANAEROBIC Blood Culture adequate volume   Culture NO GROWTH 5 DAYS  Final   Report Status 10/28/2016 FINAL  Final  MRSA PCR Screening     Status: None   Collection Time: 10/24/16  5:03 AM  Result Value Ref Range Status   MRSA by PCR NEGATIVE NEGATIVE Final    Comment:        The GeneXpert MRSA Assay (FDA approved for NASAL specimens only), is one component of a comprehensive MRSA colonization surveillance program. It is not intended to diagnose MRSA infection nor to guide or monitor treatment for MRSA infections.   Surgical pcr screen     Status: None   Collection Time: 10/30/16  6:21 AM  Result Value Ref Range Status   MRSA, PCR NEGATIVE NEGATIVE Final   Staphylococcus aureus NEGATIVE NEGATIVE Final    Comment:        The Xpert SA Assay (FDA approved for NASAL specimens in patients over 45 years of age), is one component of a comprehensive surveillance program.  Test performance has been validated by [facility] for patients greater than or equal to 41 year old. It is not intended to diagnose infection nor to guide or monitor treatment.          Radiology Studies: No results found.      Scheduled Meds: . ALPRAZolam  1 mg Oral BID  . amLODipine  10 mg Oral Daily  . aspirin EC  81 mg Oral Daily  . buPROPion  150 mg Oral Daily   And  . buPROPion  300 mg Oral QHS  . calcitRIOL  0.5 mcg Oral Daily  . calcium acetate  1,334 mg Oral TID WC  . cinacalcet  60 mg Oral Q breakfast  . cloNIDine  0.2 mg Transdermal Weekly  . darbepoetin (ARANESP) injection - NON-DIALYSIS  60 mcg Subcutaneous Q Sun-1800  . feeding supplement (NEPRO CARB STEADY)  237 mL Oral BID BM  . feeding supplement (PRO-STAT SUGAR FREE 64)  30 mL Oral BID  . gentamicin cream  1 application Topical Daily  . heparin  5,000 Units Subcutaneous Q8H  . hydrALAZINE  100 mg Oral  BID  . isosorbide dinitrate  5 mg Oral BID  . labetalol  200 mg Oral BID  . pantoprazole  40 mg Oral Daily  . PARoxetine  40 mg Oral Daily  . pravastatin  20 mg Oral Daily  . saccharomyces boulardii  250 mg Oral BID  . sodium chloride flush  3 mL Intravenous Q12H  . Vitamin D (Ergocalciferol)  50,000 Units Oral Q7 days  . zolpidem  5 mg Oral QHS   Continuous Infusions: . sodium chloride    . dialysis solution 1.5% low-MG/low-CA    . piperacillin-tazobactam (ZOSYN)  IV 3.375 g (10/30/16 1233)  . vancomycin       LOS: 6 days    Time spent: 55 minutes    Bj Morlock, MD [name] Pager [phone]  If 7PM-7AM, please contact night-coverage [email or web address] Password [email or web address] 10/30/2016, 1:38 PM

## 2016-10-31 ENCOUNTER — Encounter (HOSPITAL_COMMUNITY): Payer: Self-pay | Admitting: Vascular Surgery

## 2016-10-31 ENCOUNTER — Inpatient Hospital Stay (HOSPITAL_COMMUNITY): Payer: 59

## 2016-10-31 ENCOUNTER — Inpatient Hospital Stay (HOSPITAL_COMMUNITY): Payer: Medicare Other

## 2016-10-31 DIAGNOSIS — R079 Chest pain, unspecified: Secondary | ICD-10-CM

## 2016-10-31 DIAGNOSIS — R9431 Abnormal electrocardiogram [ECG] [EKG]: Secondary | ICD-10-CM

## 2016-10-31 LAB — NM MYOCAR MULTI W/SPECT W/WALL MOTION / EF
CHL CUP NUCLEAR SDS: 5
CHL CUP NUCLEAR SRS: 4
CHL CUP NUCLEAR SSS: 9
CSEPPHR: 78 {beats}/min
LHR: 0.39
LV dias vol: 212 mL (ref 62–150)
LVSYSVOL: 121 mL
Rest HR: 73 {beats}/min
TID: 1.2

## 2016-10-31 LAB — CBC WITH DIFFERENTIAL/PLATELET
BASOS ABS: 0.2 10*3/uL — AB (ref 0.0–0.1)
Basophils Relative: 1 %
Eosinophils Absolute: 1.6 10*3/uL — ABNORMAL HIGH (ref 0.0–0.7)
Eosinophils Relative: 15 %
HEMATOCRIT: 29.2 % — AB (ref 39.0–52.0)
Hemoglobin: 8.9 g/dL — ABNORMAL LOW (ref 13.0–17.0)
LYMPHS ABS: 1.4 10*3/uL (ref 0.7–4.0)
LYMPHS PCT: 12 %
MCH: 24.2 pg — AB (ref 26.0–34.0)
MCHC: 30.5 g/dL (ref 30.0–36.0)
MCV: 79.3 fL (ref 78.0–100.0)
MONO ABS: 1.4 10*3/uL — AB (ref 0.1–1.0)
Monocytes Relative: 13 %
NEUTROS ABS: 6.5 10*3/uL (ref 1.7–7.7)
Neutrophils Relative %: 59 %
Platelets: 495 10*3/uL — ABNORMAL HIGH (ref 150–400)
RBC: 3.68 MIL/uL — AB (ref 4.22–5.81)
RDW: 18.9 % — AB (ref 11.5–15.5)
WBC: 11.1 10*3/uL — ABNORMAL HIGH (ref 4.0–10.5)

## 2016-10-31 LAB — RENAL FUNCTION PANEL
ALBUMIN: 1.7 g/dL — AB (ref 3.5–5.0)
ANION GAP: 17 — AB (ref 5–15)
BUN: 61 mg/dL — AB (ref 6–20)
CHLORIDE: 95 mmol/L — AB (ref 101–111)
CO2: 24 mmol/L (ref 22–32)
Calcium: 6.7 mg/dL — ABNORMAL LOW (ref 8.9–10.3)
Creatinine, Ser: 12.89 mg/dL — ABNORMAL HIGH (ref 0.61–1.24)
GFR calc Af Amer: 5 mL/min — ABNORMAL LOW (ref >60)
GFR calc non Af Amer: 4 mL/min — ABNORMAL LOW (ref >60)
GLUCOSE: 69 mg/dL (ref 65–99)
PHOSPHORUS: 9.4 mg/dL — AB (ref 2.5–4.6)
POTASSIUM: 3.9 mmol/L (ref 3.5–5.1)
Sodium: 136 mmol/L (ref 135–145)

## 2016-10-31 LAB — MAGNESIUM: MAGNESIUM: 2.1 mg/dL (ref 1.7–2.4)

## 2016-10-31 LAB — POCT ACTIVATED CLOTTING TIME: Activated Clotting Time: 169 seconds

## 2016-10-31 MED ORDER — REGADENOSON 0.4 MG/5ML IV SOLN
INTRAVENOUS | Status: AC
  Administered 2016-10-31: 0.4 mg via INTRAVENOUS
  Filled 2016-10-31: qty 5

## 2016-10-31 MED ORDER — HYDROXYZINE HCL 25 MG PO TABS
25.0000 mg | ORAL_TABLET | Freq: Three times a day (TID) | ORAL | Status: DC | PRN
  Administered 2016-10-31 – 2016-11-04 (×3): 25 mg via ORAL
  Filled 2016-10-31 (×3): qty 1

## 2016-10-31 MED ORDER — PIPERACILLIN-TAZOBACTAM 3.375 G IVPB
3.3750 g | Freq: Two times a day (BID) | INTRAVENOUS | Status: DC
  Administered 2016-10-31 – 2016-11-04 (×8): 3.375 g via INTRAVENOUS
  Filled 2016-10-31 (×9): qty 50

## 2016-10-31 MED ORDER — TECHNETIUM TC 99M TETROFOSMIN IV KIT
10.0000 | PACK | Freq: Once | INTRAVENOUS | Status: AC | PRN
  Administered 2016-10-31: 10 via INTRAVENOUS

## 2016-10-31 MED ORDER — TECHNETIUM TC 99M TETROFOSMIN IV KIT: 30.0000 | PACK | Freq: Once | INTRAVENOUS | Status: DC | PRN

## 2016-10-31 MED ORDER — PROCHLORPERAZINE EDISYLATE 5 MG/ML IJ SOLN
10.0000 mg | Freq: Four times a day (QID) | INTRAMUSCULAR | Status: DC | PRN
  Filled 2016-10-31: qty 2

## 2016-10-31 MED ORDER — REGADENOSON 0.4 MG/5ML IV SOLN
0.4000 mg | Freq: Once | INTRAVENOUS | Status: AC
  Administered 2016-10-31: 0.4 mg via INTRAVENOUS
  Filled 2016-10-31: qty 5

## 2016-10-31 NOTE — Progress Notes (Addendum)
Nuc result reviewed with Dr. Sallyanne Kuster who reviewed scan and disagrees with assessment. He would have called it a lower risk study. He recommends f/u echo before making final decision. Will call echo first thing in AM to expedite. Notified IM. Also spoke with patient's nurse to make him aware that we are pending echo before final assessment of nuc result. Rounding team to discuss more in depth in AM. Of note Dr. Grandville Silos noted prolonged QT on EKG today. He is stopping pt's Paxil, Zofran. He reports patient also now agreeable to HD whereas he deferred this yesterday. Prior baseline QT was 491 so has been prolonged in the past as well. F/u EKG in AM.  Melina Copa PA-C

## 2016-10-31 NOTE — Progress Notes (Signed)
PROGRESS NOTE    Hayden [name]  HDQ:[id number] DOB: March 28, 1971 DOA: 10/23/2016 PCP: Louis Meckel, MD    Brief Narrative:  46 y.o.malewith medical history significant of ESRD on home peritoneal dialysis, HTN, CVA; who presents with complaints of right foot redness and swelling. The initial injury happened over one month ago when his foot caught in the track of the back seat of his sisters mini [name]. He initially triedto keep the area clean and washed. However, develop redness, swelling and pain. Approximately 2 weeks ago he was seen at the emergency department and was given 1 dose of vancomycin,but ended up leaving AMA. Patient saw his PCP who prescribed clindamycin, patient took for last 12-13 days however states that the pain continued to get worse and with the drainage from the wound.    Assessment & Plan:   Principal Problem:   Cellulitis of great toe of right foot Active Problems:   Hypertensive urgency, malignant   End stage renal disease on dialysis (HCC)   Anxiety and depression   Leukocytosis   Hyperkalemia   Pressure injury of skin   Gangrene of right foot ([place])  Cellulitis/dry gangrene of the right foot/Right great toe:  - failed treatment with outpatient antibiotics of clindamycin - Continue empiric antibiotics of vancomycin and Zosyn - Vascular surgery consulted, Arteriogram showed R SFA occlusion, unfortunately was not able to remain still for intervention. Per vascular surgery, may need to consider right BKA versus AKA if endovascular procedure to right SFA or possible bypass grafting not successful  - Patient status post anterograde right, femoral artery cannulation under ultrasound guidance, right leg runoff, angioplasty right superficial femoral artery per Dr. Bridgett Larsson 10/30/2016. -Appreciate Dr. Jess Barters evaluation -Continue current pain control management  - Patient undergoing preop cardiac evaluation with Myoview stress test done 10/31/2016. Cardiology  following. See below. - Vascular surgery following and will reassess the patient on Monday to discuss surgical options depending on cardiac risk stratification.  Active problems Hypertensive urgency: Blood pressures noted to be as high as 240/110 on admission. Patient noted not recently taking home blood pressure medications  - Blood pressure improved on current regimen of Norvasc, clonidine patch, hydralazine, labetalol, Isordil.  Leukocytosis: WBC elevated at 14.6. WBC trending down. Suspect secondary to above. - continue IV antibiotics per pharmacy  ESRD on peritoneal dialysis: Patient receives nightly peritoneal pulses dialysis. Initial BUN 102, creatinine 13.79. Patient noted to refuse peritoneal dialysis last night. - Nephrology consulted for peritoneal dialysis - Continue Sensipar, calcitriol, andPhosLo - Per nephrology.  Hyperkalemia:Acute. Initial potassium 5.4 on admission. - Resolved  Elevated anion gap likely secondary to uremia.   Elevatedalkaline phosphatase - Improving  Anxiety and depression - Anxiety improving, continue increased Xanax  dose.   Prolonged QTC - Avoid QTC prolonging medications, discontinued Lexapro. Repeat EKG still with QTC prolongation. Check a magnesium level.will discontin PPI, albuterol nebs, Zofran, Paxil. Repeat EKG in 1-2 days.  -Preoperative cardiac evaluation Cardiology was consulted for preop evaluation. 2-D echo pending. Patient with Myoview stress test(10/31/2016) that shows intermediate risk with a EF of 43%, inferior hypokinesis, reversible medium-sized, mild basilar to mid inferior and basilar inferolateral perfusion defect concerning for possible ischemia. Cardiology following and will determine as to whether patient needs a cardiac catheterization for further risk stratification.   DVT prophylaxis: Heparin Code Status: Full Family Communication: Updated patient and family at bedside. Disposition Plan: Per vascular  surgery.   Consultants:   Cardiology : Dr.Croitoru 10/30/2016  Orthopedics: Dr. Sharol Given 10/28/2016  Vascular surgery:  Dr. Bridgett Larsson 10/24/2016  Nephrology: Dr. Jeani Hawking 10/23/2016    Procedures:   Antegrade right common femoral artery cannulation under ultrasound guidance, right leg runoff, angioplasty right superficial femoral artery per Dr. Bridgett Larsson 10/30/2016  Plain xray right foot 10/23/2016  Lower extremity vein mapping 10/28/2016  Lower extremity arterial duplex 10/25/2016  ABIs 10/25/2016--attempted  Abdominal aortogram with lower extremity 10/28/2016 Dr. Doren Custard  Myoview stress test 10/31/2016  2-D echo pending  Antimicrobials:   Gentamicin cream 10/23/2016  IV Zosyn 10/23/2016  IV vancomycin 10/23/2016   Subjective: Patient sitting up in bed. Denies chest pain. Denies shortness of breath. Patient still with complaints of right foot pain. Patient states as long as pain is moderate when he takes the pain medication it improves his pain. Patient states however if pain is severe pain medication only decreases down to a 7 from a 10.  Patient states he spoke with cardiology and concerns for life expectancy of 8 months was clarified for him. Patient noted to refuse peritoneal dialysis last night.  Objective: Vitals:   10/31/16 1226 10/31/16 1236 10/31/16 1238 10/31/16 1240  BP: 139/72 (!) 123/56 (!) 133/59 132/61  Pulse:      Resp: 18     Temp:      TempSrc:      SpO2:      Weight:      Height:        Intake/Output Summary (Last 24 hours) at 10/31/16 1524 Last data filed at 10/31/16 1033  Gross per 24 hour  Intake              516 ml  Output                0 ml  Net              516 ml   Filed Weights   10/29/16 2134 10/30/16 0630 10/30/16 2149  Weight: 90.2 kg (198 lb 14.4 oz) 84.1 kg (185 lb 6.5 oz) 88.9 kg (196 lb)    Examination:  General exam: Appears calm and comfortable  Respiratory system: Clear to auscultation. Respiratory effort  normal. Cardiovascular system: S1 & S2 heard, RRR. 2-3/6 SEM. No JVD,rubs, gallops or clicks. No pedal edema. Gastrointestinal system: Abdomen is nondistended, soft and nontender. No organomegaly or masses felt. Normal bowel sounds heard. Central nervous system: Alert and oriented. No focal neurological deficits. Extremities: Right great toe with dry gangrenous ulcer. Skin: No rashes, lesions or ulcers Psychiatry: Judgement and insight appear fair -normal. Mood & affect appropriate.     Data Reviewed: I have personally reviewed following labs and imaging studies  CBC:  Recent Labs Lab 10/26/16 0324 10/27/16 0454 10/28/16 0444 10/30/16 0614 10/31/16 0540  WBC 14.5* 14.4* 14.2* 13.2* 11.1*  NEUTROABS  --   --   --   --  6.5  HGB 10.3* 9.8* 10.1* 10.4* 8.9*  HCT 33.3* 31.5* 31.5* 33.0* 29.2*  MCV 78.9 78.8 79.5 79.1 79.3  PLT 469* 501* 515* 588* 606*   Basic Metabolic Panel:  Recent Labs Lab 10/27/16 0454 10/28/16 0444 10/29/16 0524 10/30/16 0614 10/31/16 0540  NA 140 140 138 137 136  K 3.8 3.7 3.7 3.6 3.9  CL 96* 98* 97* 94* 95*  CO2 25 27 25 26 24   GLUCOSE 126* 102* 86 93 69  BUN 71* 62* 55* 54* 61*  CREATININE 11.88* 11.88* 11.46* 11.77* 12.89*  CALCIUM 7.3* 7.0* 7.2* 7.4* 6.7*  PHOS  --   --  9.1*  --  9.4*   GFR: Estimated Creatinine Clearance: 8.2 mL/min (A) (by C-G formula based on SCr of 12.89 mg/dL (H)). Liver Function Tests:  Recent Labs Lab 10/28/16 0444 10/29/16 0524 10/31/16 0540  AST 22  --   --   ALT 30  --   --   ALKPHOS 208*  --   --   BILITOT 0.7  --   --   PROT 5.8*  --   --   ALBUMIN 2.0* 2.0* 1.7*   No results for input(s): LIPASE, AMYLASE in the last 168 hours. No results for input(s): AMMONIA in the last 168 hours. Coagulation Profile:  Recent Labs Lab 10/30/16 0614  INR 1.24   Cardiac Enzymes: No results for input(s): CKTOTAL, CKMB, CKMBINDEX, TROPONINI in the last 168 hours. BNP (last 3 results) No results for input(s):  PROBNP in the last 8760 hours. HbA1C: No results for input(s): HGBA1C in the last 72 hours. CBG:  Recent Labs Lab 10/24/16 2126 10/30/16 1050  GLUCAP 102* 78   Lipid Profile: No results for input(s): CHOL, HDL, LDLCALC, TRIG, CHOLHDL, LDLDIRECT in the last 72 hours. Thyroid Function Tests: No results for input(s): TSH, T4TOTAL, FREET4, T3FREE, THYROIDAB in the last 72 hours. Anemia Panel: No results for input(s): VITAMINB12, FOLATE, FERRITIN, TIBC, IRON, RETICCTPCT in the last 72 hours. Sepsis Labs: No results for input(s): PROCALCITON, LATICACIDVEN in the last 168 hours.  Recent Results (from the past 240 hour(s))  Blood culture (routine x 2)     Status: None   Collection Time: 10/23/16  5:20 PM  Result Value Ref Range Status   Specimen Description BLOOD RIGHT ANTECUBITAL  Final   Special Requests   Final    BOTTLES DRAWN AEROBIC AND ANAEROBIC Blood Culture adequate volume   Culture NO GROWTH 5 DAYS  Final   Report Status 10/28/2016 FINAL  Final  Blood culture (routine x 2)     Status: None   Collection Time: 10/23/16  6:26 PM  Result Value Ref Range Status   Specimen Description BLOOD RIGHT ANTECUBITAL  Final   Special Requests   Final    BOTTLES DRAWN AEROBIC AND ANAEROBIC Blood Culture adequate volume   Culture NO GROWTH 5 DAYS  Final   Report Status 10/28/2016 FINAL  Final  MRSA PCR Screening     Status: None   Collection Time: 10/24/16  5:03 AM  Result Value Ref Range Status   MRSA by PCR NEGATIVE NEGATIVE Final    Comment:        The GeneXpert MRSA Assay (FDA approved for NASAL specimens only), is one component of a comprehensive MRSA colonization surveillance program. It is not intended to diagnose MRSA infection nor to guide or monitor treatment for MRSA infections.   Surgical pcr screen     Status: None   Collection Time: 10/30/16  6:21 AM  Result Value Ref Range Status   MRSA, PCR NEGATIVE NEGATIVE Final   Staphylococcus aureus NEGATIVE NEGATIVE  Final    Comment:        The Xpert SA Assay (FDA approved for NASAL specimens in patients over 52 years of age), is one component of a comprehensive surveillance program.  Test performance has been validated by [facility] for patients greater than or equal to 30 year old. It is not intended to diagnose infection nor to guide or monitor treatment.          Radiology Studies: No results found.      Scheduled Meds: . ALPRAZolam  1 mg Oral BID  . amLODipine  10 mg Oral Daily  . aspirin EC  81 mg Oral Daily  . buPROPion  150 mg Oral Daily   And  . buPROPion  300 mg Oral QHS  . calcitRIOL  0.5 mcg Oral Daily  . calcium acetate  1,334 mg Oral TID WC  . cinacalcet  60 mg Oral Q breakfast  . cloNIDine  0.2 mg Transdermal Weekly  . darbepoetin (ARANESP) injection - NON-DIALYSIS  60 mcg Subcutaneous Q Sun-1800  . feeding supplement (NEPRO CARB STEADY)  237 mL Oral BID BM  . feeding supplement (PRO-STAT SUGAR FREE 64)  30 mL Oral BID  . gentamicin cream  1 application Topical Daily  . heparin  5,000 Units Subcutaneous Q8H  . hydrALAZINE  100 mg Oral BID  . isosorbide dinitrate  5 mg Oral BID  . labetalol  200 mg Oral BID  . pantoprazole  40 mg Oral Daily  . PARoxetine  40 mg Oral Daily  . pravastatin  20 mg Oral Daily  . saccharomyces boulardii  250 mg Oral BID  . sodium chloride flush  3 mL Intravenous Q12H  . Vitamin D (Ergocalciferol)  50,000 Units Oral Q7 days  . zolpidem  5 mg Oral QHS   Continuous Infusions: . sodium chloride    . dialysis solution 1.5% low-MG/low-CA    . piperacillin-tazobactam (ZOSYN)  IV    . vancomycin Stopped (10/30/16 1801)     LOS: 7 days    Time spent: 21 minutes    Manuelita Moxon, MD [name] Pager [phone]  If 7PM-7AM, please contact night-coverage [email or web address] Password [email or web address] 10/31/2016, 3:24 PM

## 2016-10-31 NOTE — Progress Notes (Signed)
[facility] Progress Note   Subjective: alert, no distress.  Refused P D last night, will do it tonight, just was "having some issues".  Asked for clarification of disease processes.  Refused PD last night but he said he just had " a lot to do". Does not want to stop dialysis at this time.   Vitals:   10/31/16 1226 10/31/16 1236 10/31/16 1238 10/31/16 1240  BP: 139/72 (!) 123/56 (!) 133/59 132/61  Pulse:      Resp: 18     Temp:      TempSrc:      SpO2:      Weight:      Height:        Inpatient medications: . ALPRAZolam  1 mg Oral BID  . amLODipine  10 mg Oral Daily  . aspirin EC  81 mg Oral Daily  . buPROPion  150 mg Oral Daily   And  . buPROPion  300 mg Oral QHS  . calcitRIOL  0.5 mcg Oral Daily  . calcium acetate  1,334 mg Oral TID WC  . cinacalcet  60 mg Oral Q breakfast  . cloNIDine  0.2 mg Transdermal Weekly  . darbepoetin (ARANESP) injection - NON-DIALYSIS  60 mcg Subcutaneous Q Sun-1800  . feeding supplement (NEPRO CARB STEADY)  237 mL Oral BID BM  . feeding supplement (PRO-STAT SUGAR FREE 64)  30 mL Oral BID  . gentamicin cream  1 application Topical Daily  . heparin  5,000 Units Subcutaneous Q8H  . hydrALAZINE  100 mg Oral BID  . isosorbide dinitrate  5 mg Oral BID  . labetalol  200 mg Oral BID  . pantoprazole  40 mg Oral Daily  . PARoxetine  40 mg Oral Daily  . pravastatin  20 mg Oral Daily  . saccharomyces boulardii  250 mg Oral BID  . sodium chloride flush  3 mL Intravenous Q12H  . Vitamin D (Ergocalciferol)  50,000 Units Oral Q7 days  . zolpidem  5 mg Oral QHS   . sodium chloride    . dialysis solution 1.5% low-MG/low-CA    . piperacillin-tazobactam (ZOSYN)  IV    . vancomycin Stopped (10/30/16 1801)   sodium chloride, acetaminophen **OR** acetaminophen, albuterol, fentaNYL (SUBLIMAZE) injection, dianeal solution for CAPD/CCPD with heparin, hydrALAZINE, ondansetron **OR** ondansetron (ZOFRAN) IV, oxyCODONE, sodium chloride  flush  Exam:    Objective Vitals:   10/31/16 1226 10/31/16 1236 10/31/16 1238 10/31/16 1240  BP: 139/72 (!) 123/56 (!) 133/59 132/61  Pulse:      Resp: 18     Temp:      TempSrc:      SpO2:      Weight:      Height:       Physical Exam General: Chronically ill appearing male in NAD Heart: S1,S2, transmitted bruit from AVF Lungs: BBS CTAB Abdomen: soft, non-tender, active BS Extremities: No LE edema. Ulcers R great toe with dry gangrenous changes. open to air.  Dialysis Access:  LFA AVF NF, Ox 3   Dialysis: [facility].  CCPD: 3L fill 5 exchanges at night with 5th fill manually drained 1-1/2 hours later. Uses mostly 1's and 2's at home.       Assessment: 1. Ischmic ulcers/ gangrene R foot/severe PAD: sp a-gram, he has lots of vasc disease in RLE.  VVS tried to open R fem artery occlusion percutaneously but that was unsuccessful.  Now he is scheduled for surgical revasc next week. May need leg  amp, pt is aware.   2. ESRD -Continue PD. K+ 3.6 3. Anemia -HGB 10.4. rec'd Aranesp 60 mcg IV 10/27/16. Follow HGB.  4. Secondary hyperparathyroidism - Continue VDRA, calcimimetic, binders.  5. HTN - continues on 4 BP meds. Reasonable control of BP at present.  6. Volume - no vol excess on exam , wt's stable and down 5-7kg from admission 6. Nutrition - Albumin 2.0 Clear liquid diet at present. Carb Mod diet, prostat when able to eat. 7. DM: per primary  Plan - cont CCPD, all 1.5%  [facility] pager [phone]   10/31/2016, 12:54 PM   Recent Labs Lab 10/29/16 0524 10/30/16 0614 10/31/16 0540  NA 138 137 136  K 3.7 3.6 3.9  CL 97* 94* 95*  CO2 25 26 24   GLUCOSE 86 93 69  BUN 55* 54* 61*  CREATININE 11.46* 11.77* 12.89*  CALCIUM 7.2* 7.4* 6.7*  PHOS 9.1*  --  9.4*    Recent Labs Lab 10/28/16 0444 10/29/16 0524 10/31/16 0540  AST 22  --   --   ALT 30  --   --   ALKPHOS 208*  --   --   BILITOT 0.7  --   --   PROT 5.8*  --    --   ALBUMIN 2.0* 2.0* 1.7*    Recent Labs Lab 10/28/16 0444 10/30/16 0614 10/31/16 0540  WBC 14.2* 13.2* 11.1*  NEUTROABS  --   --  6.5  HGB 10.1* 10.4* 8.9*  HCT 31.5* 33.0* 29.2*  MCV 79.5 79.1 79.3  PLT 515* 588* 495*   Iron/TIBC/Ferritin/ %Sat    Component Value Date/Time   IRON 7 (L) 11/15/2015 1342   TIBC 164 (L) 11/15/2015 1342   FERRITIN 1,444 (H) 11/15/2015 1342   IRONPCTSAT 4 (L) 11/15/2015 1342

## 2016-10-31 NOTE — Progress Notes (Signed)
   Daily Progress Note  Pt not on floor currently.  Preop cardiac w/u in progress.  Will check in on patient on Monday to discuss surgical options depending on cardiac risk stratification.   Adele Barthel, MD, FACS [facility] Office: [phone] Pager: [phone]  10/31/2016, 12:50 PM

## 2016-10-31 NOTE — Progress Notes (Signed)
Progress Note  Patient Name: Hayden [name] Date of Encounter: 10/31/2016  Primary Cardiologist: Dr. Sallyanne Kuster  Subjective   No chest pain. No complaints. Seen down in nuc med.  Inpatient Medications    Scheduled Meds: . ALPRAZolam  1 mg Oral BID  . amLODipine  10 mg Oral Daily  . aspirin EC  81 mg Oral Daily  . buPROPion  150 mg Oral Daily   And  . buPROPion  300 mg Oral QHS  . calcitRIOL  0.5 mcg Oral Daily  . calcium acetate  1,334 mg Oral TID WC  . cinacalcet  60 mg Oral Q breakfast  . cloNIDine  0.2 mg Transdermal Weekly  . darbepoetin (ARANESP) injection - NON-DIALYSIS  60 mcg Subcutaneous Q Sun-1800  . feeding supplement (NEPRO CARB STEADY)  237 mL Oral BID BM  . feeding supplement (PRO-STAT SUGAR FREE 64)  30 mL Oral BID  . gentamicin cream  1 application Topical Daily  . heparin  5,000 Units Subcutaneous Q8H  . hydrALAZINE  100 mg Oral BID  . isosorbide dinitrate  5 mg Oral BID  . labetalol  200 mg Oral BID  . pantoprazole  40 mg Oral Daily  . PARoxetine  40 mg Oral Daily  . pravastatin  20 mg Oral Daily  . saccharomyces boulardii  250 mg Oral BID  . sodium chloride flush  3 mL Intravenous Q12H  . Vitamin D (Ergocalciferol)  50,000 Units Oral Q7 days  . zolpidem  5 mg Oral QHS   Continuous Infusions: . sodium chloride    . dialysis solution 1.5% low-MG/low-CA    . piperacillin-tazobactam (ZOSYN)  IV    . vancomycin Stopped (10/30/16 1801)   PRN Meds: sodium chloride, acetaminophen **OR** acetaminophen, albuterol, fentaNYL (SUBLIMAZE) injection, dianeal solution for CAPD/CCPD with heparin, hydrALAZINE, ondansetron **OR** ondansetron (ZOFRAN) IV, oxyCODONE, sodium chloride flush   Vital Signs    Vitals:   10/30/16 2149 10/31/16 0513 10/31/16 0800 10/31/16 1029  BP: (!) 123/59 138/65 103/70 103/70  Pulse: 73 71 74 74  Resp: 18 18 16    Temp: 99 F (37.2 C) 97.7 F (36.5 C) 98.4 F (36.9 C)   TempSrc: Oral Oral Oral   SpO2: 93% 95% 96%   Weight:  196 lb (88.9 kg)     Height:        Intake/Output Summary (Last 24 hours) at 10/31/16 1128 Last data filed at 10/31/16 1033  Gross per 24 hour  Intake              519 ml  Output                0 ml  Net              519 ml   Filed Weights   10/29/16 2134 10/30/16 0630 10/30/16 2149  Weight: 198 lb 14.4 oz (90.2 kg) 185 lb 6.5 oz (84.1 kg) 196 lb (88.9 kg)    Telemetry    N/A  Physical Exam   GEN: No acute distress, chronically ill appearing.  HEENT: Normocephalic, atraumatic, sclera non-icteric. Neck: No JVD or bruits. Cardiac: RRR, 2/6 SEM, no rub or gallops.  Radials/DP/PT 1+ and equal bilaterally.  Respiratory: Clear to auscultation bilaterally. Breathing is unlabored. GI: Soft, nontender, non-distended, BS +x 4. MS: no deformity. Extremities: No clubbing or cyanosis. No edema. Right foot wound.  Neuro:  AAOx3. Follows commands. Psych:  Responds to questions appropriately with a normal affect.  Labs  Chemistry Recent Labs Lab 10/28/16 0444 10/29/16 0524 10/30/16 0614 10/31/16 0540  NA 140 138 137 136  K 3.7 3.7 3.6 3.9  CL 98* 97* 94* 95*  CO2 27 25 26 24   GLUCOSE 102* 86 93 69  BUN 62* 55* 54* 61*  CREATININE 11.88* 11.46* 11.77* 12.89*  CALCIUM 7.0* 7.2* 7.4* 6.7*  PROT 5.8*  --   --   --   ALBUMIN 2.0* 2.0*  --  1.7*  AST 22  --   --   --   ALT 30  --   --   --   ALKPHOS 208*  --   --   --   BILITOT 0.7  --   --   --   GFRNONAA 5* 5* 5* 4*  GFRAA 5* 5* 5* 5*  ANIONGAP 15 16* 17* 17*     Hematology Recent Labs Lab 10/28/16 0444 10/30/16 0614 10/31/16 0540  WBC 14.2* 13.2* 11.1*  RBC 3.96* 4.17* 3.68*  HGB 10.1* 10.4* 8.9*  HCT 31.5* 33.0* 29.2*  MCV 79.5 79.1 79.3  MCH 25.5* 24.9* 24.2*  MCHC 32.1 31.5 30.5  RDW 19.3* 19.3* 18.9*  PLT 515* 588* 495*    Radiology    No results found.  Cardiac Studies   Pending  Patient Profile     46 y.o. male with ESRD on peritoneal dialysis, hypertension, CVA, obesity, hyperlipidemia,  diabetes, chronic diastolic CHF, kidney cancer, anemia, stroke whom cardiology is following for pre-op evaluation. Injured right great toe several weeks ago but did not finish OP course of abx -> admitted for early gangrene. Dr. Bridgett Larsson attempted endovascular revascularization of R Leg but he was unsuccessful. Due to an involuntarily movement disorder Mr. Shelvin was unable to lay still the the attempt at recannulation of the right superficial femoral artery failed. and generally anesthesia will be required to do fa femoropopliteal via femoroperoneal bypass sometime next week. If this is unsuccessful the patient may require amputation.   Assessment & Plan    1. Pre-operative risk assessment - pending nuc result.  2. Prominent systolic murmur - ? r/t HD. Pending echo.  3. ESRD on PD - patient refused dialysis last night citing someone told him he only had 8 months to live. Not clear where this came from. Further per primary teams.  4.  Chronic anemia - per IM.  5. Chronic diastolic CHF - pending echo. Volume managed with HD.  Signed, Charlie Pitter, PA-C  10/31/2016, 11:28 AM    I have seen and examined the patient along with Charlie Pitter, PA-C .  I have reviewed the chart, notes and new data.  I agree with PA/NP's note.  Key new complaints: no CV complaints Key new findings / data: I reviewed the nuclear images. The reported inferolateral reversible defect is very mild and very questionable in my opinion.  PLAN: Would like to confirm the low EF with echo. If EF is indeed low, would recommend cardiac cath/angio. Otherwise, I do not think coronary angiography will be indicated as part of preop workup and he can proceed to vascular surgery procedure with moderate risk (due to comorbid conditions)   Sanda Klein, MD, [place] [phone] 10/31/2016, 5:50 PM

## 2016-11-01 ENCOUNTER — Inpatient Hospital Stay (HOSPITAL_COMMUNITY): Payer: Medicare Other

## 2016-11-01 DIAGNOSIS — I360 Nonrheumatic tricuspid (valve) stenosis: Secondary | ICD-10-CM

## 2016-11-01 LAB — CBC WITH DIFFERENTIAL/PLATELET
BASOS ABS: 0.1 10*3/uL (ref 0.0–0.1)
BASOS PCT: 1 %
EOS ABS: 1.7 10*3/uL — AB (ref 0.0–0.7)
Eosinophils Relative: 13 %
HCT: 30.3 % — ABNORMAL LOW (ref 39.0–52.0)
Hemoglobin: 9.5 g/dL — ABNORMAL LOW (ref 13.0–17.0)
Lymphocytes Relative: 7 %
Lymphs Abs: 0.9 10*3/uL (ref 0.7–4.0)
MCH: 24.9 pg — AB (ref 26.0–34.0)
MCHC: 31.4 g/dL (ref 30.0–36.0)
MCV: 79.5 fL (ref 78.0–100.0)
MONO ABS: 1.5 10*3/uL — AB (ref 0.1–1.0)
Monocytes Relative: 11 %
NEUTROS ABS: 9 10*3/uL — AB (ref 1.7–7.7)
NEUTROS PCT: 68 %
Platelets: 520 10*3/uL — ABNORMAL HIGH (ref 150–400)
RBC: 3.81 MIL/uL — ABNORMAL LOW (ref 4.22–5.81)
RDW: 19 % — ABNORMAL HIGH (ref 11.5–15.5)
WBC: 13.2 10*3/uL — ABNORMAL HIGH (ref 4.0–10.5)

## 2016-11-01 LAB — RENAL FUNCTION PANEL
ANION GAP: 22 — AB (ref 5–15)
Albumin: 2 g/dL — ABNORMAL LOW (ref 3.5–5.0)
BUN: 60 mg/dL — ABNORMAL HIGH (ref 6–20)
CHLORIDE: 93 mmol/L — AB (ref 101–111)
CO2: 20 mmol/L — AB (ref 22–32)
Calcium: 7.1 mg/dL — ABNORMAL LOW (ref 8.9–10.3)
Creatinine, Ser: 12.8 mg/dL — ABNORMAL HIGH (ref 0.61–1.24)
GFR calc non Af Amer: 4 mL/min — ABNORMAL LOW (ref >60)
GFR, EST AFRICAN AMERICAN: 5 mL/min — AB (ref >60)
Glucose, Bld: 110 mg/dL — ABNORMAL HIGH (ref 65–99)
Phosphorus: 8.7 mg/dL — ABNORMAL HIGH (ref 2.5–4.6)
Potassium: 3.7 mmol/L (ref 3.5–5.1)
Sodium: 135 mmol/L (ref 135–145)

## 2016-11-01 LAB — MAGNESIUM: MAGNESIUM: 2.1 mg/dL (ref 1.7–2.4)

## 2016-11-01 LAB — ECHOCARDIOGRAM COMPLETE
HEIGHTINCHES: 70 in
Weight: 3118.19 oz

## 2016-11-01 MED ORDER — OXYCODONE HCL ER 15 MG PO T12A
15.0000 mg | EXTENDED_RELEASE_TABLET | Freq: Two times a day (BID) | ORAL | Status: DC
  Administered 2016-11-01 – 2016-11-04 (×6): 15 mg via ORAL
  Filled 2016-11-01 (×6): qty 1

## 2016-11-01 NOTE — Progress Notes (Signed)
[facility] Progress Note   Subjective:  Still with foot pain. No CP or dyspnea. Cardiology developing plan. No issues with PD last night.  Objective Vitals:   10/31/16 2010 10/31/16 2306 11/01/16 0440 11/01/16 1055  BP: (!) 141/63 130/68 (!) 120/57 129/64  Pulse: 72 72 71 74  Resp: 17 16 16 16   Temp: 98.3 F (36.8 C) 97.8 F (36.6 C) 98.2 F (36.8 C) 98.4 F (36.9 C)  TempSrc: Oral Oral Oral Oral  SpO2: 96% 92% 97%   Weight: 88.4 kg (194 lb 14.2 oz)   84.8 kg (187 lb)  Height:       Physical Exam General: NAD, alert. Heart: RRR; no murmur. Lungs: CTAB. Abdomen: soft, PD cath in upper L abdomen without tenderness Extremities: No LE edema. Necrotic R toe wound. Dialysis Access: PD cath and L forearm AVF  Additional Objective Labs: Basic Metabolic Panel:  Recent Labs Lab 10/29/16 0524 10/30/16 0614 10/31/16 0540 11/01/16 0425  NA 138 137 136 135  K 3.7 3.6 3.9 3.7  CL 97* 94* 95* 93*  CO2 25 26 24  20*  GLUCOSE 86 93 69 110*  BUN 55* 54* 61* 60*  CREATININE 11.46* 11.77* 12.89* 12.80*  CALCIUM 7.2* 7.4* 6.7* 7.1*  PHOS 9.1*  --  9.4* 8.7*   Liver Function Tests:  Recent Labs Lab 10/28/16 0444 10/29/16 0524 10/31/16 0540 11/01/16 0425  AST 22  --   --   --   ALT 30  --   --   --   ALKPHOS 208*  --   --   --   BILITOT 0.7  --   --   --   PROT 5.8*  --   --   --   ALBUMIN 2.0* 2.0* 1.7* 2.0*   CBC:  Recent Labs Lab 10/27/16 0454 10/28/16 0444 10/30/16 0614 10/31/16 0540 11/01/16 0425  WBC 14.4* 14.2* 13.2* 11.1* 13.2*  NEUTROABS  --   --   --  6.5 9.0*  HGB 9.8* 10.1* 10.4* 8.9* 9.5*  HCT 31.5* 31.5* 33.0* 29.2* 30.3*  MCV 78.8 79.5 79.1 79.3 79.5  PLT 501* 515* 588* 495* 520*   CBG:  Recent Labs Lab 10/30/16 1050  GLUCAP 78   Studies/Results: Nm Myocar Multi W/spect W/wall Motion / Ef  Result Date: 10/31/2016  There was no ST segment deviation noted during stress.  Findings consistent with ischemia.  This is an  intermediate risk study.  The left ventricular ejection fraction is mildly decreased (45-54%).  1. EF 43%, inferior hypokinesis. 2. Reversible, medium sized, mild basal to mid inferior and basal inferolateral perfusion defect.  This is concerning for possible ischemia. 3. Intermediate risk study.   Medications: . sodium chloride    . dialysis solution 1.5% low-MG/low-CA    . piperacillin-tazobactam (ZOSYN)  IV Stopped (11/01/16 1253)  . vancomycin Stopped (10/30/16 1801)   . ALPRAZolam  1 mg Oral BID  . amLODipine  10 mg Oral Daily  . aspirin EC  81 mg Oral Daily  . buPROPion  150 mg Oral Daily   And  . buPROPion  300 mg Oral QHS  . calcitRIOL  0.5 mcg Oral Daily  . calcium acetate  1,334 mg Oral TID WC  . cinacalcet  60 mg Oral Q breakfast  . cloNIDine  0.2 mg Transdermal Weekly  . darbepoetin (ARANESP) injection - NON-DIALYSIS  60 mcg Subcutaneous Q Sun-1800  . feeding supplement (NEPRO CARB STEADY)  237 mL Oral BID BM  .  feeding supplement (PRO-STAT SUGAR FREE 64)  30 mL Oral BID  . gentamicin cream  1 application Topical Daily  . heparin  5,000 Units Subcutaneous Q8H  . hydrALAZINE  100 mg Oral BID  . isosorbide dinitrate  5 mg Oral BID  . labetalol  200 mg Oral BID  . pantoprazole  40 mg Oral Daily  . pravastatin  20 mg Oral Daily  . saccharomyces boulardii  250 mg Oral BID  . sodium chloride flush  3 mL Intravenous Q12H  . Vitamin D (Ergocalciferol)  50,000 Units Oral Q7 days  . zolpidem  5 mg Oral QHS    Dialysis Orders: [facility].  CCPD: 3L fill 5 exchanges at night with 5th fill manually drained 1-1/2 hours later. Uses mostly 1's and 2's at home.   Assessment/Plan: 1. Ischemic ulcers/ gangrene R foot/severe PAD: Severe PAD in RLE.  VVS tried to open R fem artery occlusion percutaneously but that was unsuccessful. Scheduled for surgical revasc next week. May need leg amp, pt is aware.   2. ESRD: Continue CCPD, using all 1.5% at this time.  3. Anemia: Hgb  9.5, continue Aranesp weekly (increase dose 60 ->100). 4. Secondary hyperparathyroidism: Continue VDRA, calcimimetic, binders.  5. HTN/volume: BP controlled, euvolemic on exam. Down ~10kg from admission. 6. Nutrition: Albumin 2.0. Continue prot supps. 7. DM: per primary  Veneta Penton, PA-C 11/01/2016, 12:33 PM  [facility] Pager: [phone]  Pt seen, examined and agree w A/P as above.  Kelly Splinter MD [employer] pager [phone]   11/01/2016, 3:22 PM

## 2016-11-01 NOTE — Progress Notes (Signed)
  Echocardiogram 2D Echocardiogram has been performed.  Hayden [name] T Hayden [name] 11/01/2016, 10:48 AM

## 2016-11-01 NOTE — Progress Notes (Signed)
  Echocardiogram 2D Echocardiogram has been performed.  Hayden [name] 11/01/2016, 10:47 AM

## 2016-11-01 NOTE — Progress Notes (Signed)
PROGRESS NOTE    Hayden [name]  DUK:[id number] DOB: February 26, 1971 DOA: 10/23/2016 PCP: Louis Meckel, MD    Brief Narrative:  46 y.o.malewith medical history significant of ESRD on home peritoneal dialysis, HTN, CVA; who presents with complaints of right foot redness and swelling. The initial injury happened over one month ago when his foot caught in the track of the back seat of his sisters mini [name]. He initially triedto keep the area clean and washed. However, develop redness, swelling and pain. Approximately 2 weeks ago he was seen at the emergency department and was given 1 dose of vancomycin,but ended up leaving AMA. Patient saw his PCP who prescribed clindamycin, patient took for last 12-13 days however states that the pain continued to get worse and with the drainage from the wound.    Assessment & Plan:   Principal Problem:   Cellulitis of great toe of right foot Active Problems:   Hypertensive urgency, malignant   End stage renal disease on dialysis (HCC)   Anxiety and depression   Leukocytosis   Hyperkalemia   Pressure injury of skin   Gangrene of right foot (HCC)   QT prolongation  Cellulitis/dry gangrene of the right foot/Right great toe:  - failed treatment with outpatient antibiotics of clindamycin - Continue empiric antibiotics of vancomycin and Zosyn - Vascular surgery consulted, Arteriogram showed R SFA occlusion, unfortunately was not able to remain still for intervention. Per vascular surgery, may need to consider right BKA versus AKA if endovascular procedure to right SFA or possible bypass grafting not successful  - Patient status post anterograde right, femoral artery cannulation under ultrasound guidance, right leg runoff, angioplasty right superficial femoral artery per Dr. Bridgett Larsson 10/30/2016. -Appreciate Dr. Jess Barters evaluation -Continue current pain control management. Will place on OxyContin 15 mg twice daily in addition to oxycodone for breakthrough  pain. - Patient undergoing preop cardiac evaluation with Myoview stress test done 10/31/2016. Cardiology following. See below. - Vascular surgery following and will reassess the patient on Monday to discuss surgical options depending on cardiac risk stratification.  Active problems Hypertensive urgency: Blood pressures noted to be as high as 240/110 on admission. Patient noted not recently taking home blood pressure medications  - Blood pressure improved on current regimen of Norvasc, clonidine patch, hydralazine, labetalol, Isordil.  Leukocytosis: WBC elevated at 14.6. WBC fluctuating. Suspect secondary to above. - continue IV antibiotics per pharmacy  ESRD on peritoneal dialysis: Patient receives nightly peritoneal pulses dialysis. Initial BUN 102, creatinine 13.79. Patient noted to refuse peritoneal dialysis the night of 10/30/2016.  - Nephrology consulted for peritoneal dialysis - Continue Sensipar, calcitriol, andPhosLo - Per nephrology.  Hyperkalemia:Acute. Initial potassium 5.4 on admission. - Resolved with peritoneal dialysis area  Elevated anion gap likely secondary to uremia.   Elevatedalkaline phosphatase - Improving  Anxiety and depression - Anxiety improving, continue increased Xanax  dose. Patient's Paxil and Lexapro have been discontinued secondary to QTc prolongation. Psychiatric consultation pending for further recommendations and management of patient's depression and anxiety.  Prolonged QTC - Avoid QTC prolonging medications, discontinued Lexapro. Repeat EKG still with QTC prolongation. Magnesium level at 2.1. Discontinued patient's albuterol nebs, Zofran, Paxil. Repeat EKG still with QTC prolongation. Monitor. Cardiology following.  -Preoperative cardiac evaluation Cardiology was consulted for preop evaluation. 2-D echo pending. Patient with Myoview stress test(10/31/2016) that shows intermediate risk with a EF of 43%, inferior hypokinesis, reversible  medium-sized, mild basilar to mid inferior and basilar inferolateral perfusion defect concerning for possible ischemia. Cardiology following and will  determine as to whether patient needs a cardiac catheterization for further risk stratification.   DVT prophylaxis: Heparin Code Status: Full Family Communication: Updated patient and family at bedside. Disposition Plan: Per vascular surgery.   Consultants:   Cardiology : Dr.Croitoru 10/30/2016  Orthopedics: Dr. Sharol Given 10/28/2016  Vascular surgery: Dr. Bridgett Larsson 10/24/2016  Nephrology: Dr. Jeani Hawking 10/23/2016  Psychiatry pending  Procedures:   Antegrade right common femoral artery cannulation under ultrasound guidance, right leg runoff, angioplasty right superficial femoral artery per Dr. Bridgett Larsson 10/30/2016  Plain xray right foot 10/23/2016  Lower extremity vein mapping 10/28/2016  Lower extremity arterial duplex 10/25/2016  ABIs 10/25/2016--attempted  Abdominal aortogram with lower extremity 10/28/2016 Dr. Doren Custard  Myoview stress test 10/31/2016  2-D echo pending 11/01/2016  Antimicrobials:   Gentamicin cream 10/23/2016  IV Zosyn 10/23/2016  IV vancomycin 10/23/2016   Subjective: Patient sitting up in bed. Denies chest pain. Denies shortness of breath. Patient still with complaints of right foot pain.  Objective: Vitals:   10/31/16 1500 10/31/16 2010 10/31/16 2306 11/01/16 0440  BP: 105/61 (!) 141/63 130/68 (!) 120/57  Pulse:  72 72 71  Resp: 16 17 16 16   Temp: 97.6 F (36.4 C) 98.3 F (36.8 C) 97.8 F (36.6 C) 98.2 F (36.8 C)  TempSrc: Oral Oral Oral Oral  SpO2: 96% 96% 92% 97%  Weight:  88.4 kg (194 lb 14.2 oz)    Height:        Intake/Output Summary (Last 24 hours) at 11/01/16 1105 Last data filed at 11/01/16 0440  Gross per 24 hour  Intake                0 ml  Output                0 ml  Net                0 ml   Filed Weights   10/30/16 0630 10/30/16 2149 10/31/16 2010  Weight: 84.1 kg (185 lb 6.5 oz)  88.9 kg (196 lb) 88.4 kg (194 lb 14.2 oz)    Examination:  General exam: Appears calm and comfortable  Respiratory system: Clear to auscultation. Respiratory effort normal. Cardiovascular system: S1 & S2 heard, RRR. 2-3/6 SEM. No JVD,rubs, gallops or clicks. No pedal edema. Gastrointestinal system: Abdomen is nondistended, soft and nontender. No organomegaly or masses felt. Normal bowel sounds heard. Central nervous system: Alert and oriented. No focal neurological deficits. Extremities: Right great toe with dry gangrenous ulcer. Skin: No rashes, lesions or ulcers Psychiatry: Judgement and insight appear fair -normal. Mood & affect appropriate.     Data Reviewed: I have personally reviewed following labs and imaging studies  CBC:  Recent Labs Lab 10/27/16 0454 10/28/16 0444 10/30/16 0614 10/31/16 0540 11/01/16 0425  WBC 14.4* 14.2* 13.2* 11.1* 13.2*  NEUTROABS  --   --   --  6.5 9.0*  HGB 9.8* 10.1* 10.4* 8.9* 9.5*  HCT 31.5* 31.5* 33.0* 29.2* 30.3*  MCV 78.8 79.5 79.1 79.3 79.5  PLT 501* 515* 588* 495* 979*   Basic Metabolic Panel:  Recent Labs Lab 10/28/16 0444 10/29/16 0524 10/30/16 0614 10/31/16 0540 11/01/16 0425  NA 140 138 137 136 135  K 3.7 3.7 3.6 3.9 3.7  CL 98* 97* 94* 95* 93*  CO2 27 25 26 24  20*  GLUCOSE 102* 86 93 69 110*  BUN 62* 55* 54* 61* 60*  CREATININE 11.88* 11.46* 11.77* 12.89* 12.80*  CALCIUM 7.0* 7.2* 7.4* 6.7* 7.1*  MG  --   --   --  2.1 2.1  PHOS  --  9.1*  --  9.4* 8.7*   GFR: Estimated Creatinine Clearance: 8.3 mL/min (A) (by C-G formula based on SCr of 12.8 mg/dL (H)). Liver Function Tests:  Recent Labs Lab 10/28/16 0444 10/29/16 0524 10/31/16 0540 11/01/16 0425  AST 22  --   --   --   ALT 30  --   --   --   ALKPHOS 208*  --   --   --   BILITOT 0.7  --   --   --   PROT 5.8*  --   --   --   ALBUMIN 2.0* 2.0* 1.7* 2.0*   No results for input(s): LIPASE, AMYLASE in the last 168 hours. No results for input(s): AMMONIA in  the last 168 hours. Coagulation Profile:  Recent Labs Lab 10/30/16 0614  INR 1.24   Cardiac Enzymes: No results for input(s): CKTOTAL, CKMB, CKMBINDEX, TROPONINI in the last 168 hours. BNP (last 3 results) No results for input(s): PROBNP in the last 8760 hours. HbA1C: No results for input(s): HGBA1C in the last 72 hours. CBG:  Recent Labs Lab 10/30/16 1050  GLUCAP 78   Lipid Profile: No results for input(s): CHOL, HDL, LDLCALC, TRIG, CHOLHDL, LDLDIRECT in the last 72 hours. Thyroid Function Tests: No results for input(s): TSH, T4TOTAL, FREET4, T3FREE, THYROIDAB in the last 72 hours. Anemia Panel: No results for input(s): VITAMINB12, FOLATE, FERRITIN, TIBC, IRON, RETICCTPCT in the last 72 hours. Sepsis Labs: No results for input(s): PROCALCITON, LATICACIDVEN in the last 168 hours.  Recent Results (from the past 240 hour(s))  Blood culture (routine x 2)     Status: None   Collection Time: 10/23/16  5:20 PM  Result Value Ref Range Status   Specimen Description BLOOD RIGHT ANTECUBITAL  Final   Special Requests   Final    BOTTLES DRAWN AEROBIC AND ANAEROBIC Blood Culture adequate volume   Culture NO GROWTH 5 DAYS  Final   Report Status 10/28/2016 FINAL  Final  Blood culture (routine x 2)     Status: None   Collection Time: 10/23/16  6:26 PM  Result Value Ref Range Status   Specimen Description BLOOD RIGHT ANTECUBITAL  Final   Special Requests   Final    BOTTLES DRAWN AEROBIC AND ANAEROBIC Blood Culture adequate volume   Culture NO GROWTH 5 DAYS  Final   Report Status 10/28/2016 FINAL  Final  MRSA PCR Screening     Status: None   Collection Time: 10/24/16  5:03 AM  Result Value Ref Range Status   MRSA by PCR NEGATIVE NEGATIVE Final    Comment:        The GeneXpert MRSA Assay (FDA approved for NASAL specimens only), is one component of a comprehensive MRSA colonization surveillance program. It is not intended to diagnose MRSA infection nor to guide or monitor  treatment for MRSA infections.   Surgical pcr screen     Status: None   Collection Time: 10/30/16  6:21 AM  Result Value Ref Range Status   MRSA, PCR NEGATIVE NEGATIVE Final   Staphylococcus aureus NEGATIVE NEGATIVE Final    Comment:        The Xpert SA Assay (FDA approved for NASAL specimens in patients over 53 years of age), is one component of a comprehensive surveillance program.  Test performance has been validated by [facility] for patients greater than or equal to 26 year old. It is not intended to diagnose infection nor to guide  or monitor treatment.          Radiology Studies: Nm Myocar Multi W/spect W/wall Motion / Ef  Result Date: 10/31/2016  There was no ST segment deviation noted during stress.  Findings consistent with ischemia.  This is an intermediate risk study.  The left ventricular ejection fraction is mildly decreased (45-54%).  1. EF 43%, inferior hypokinesis. 2. Reversible, medium sized, mild basal to mid inferior and basal inferolateral perfusion defect.  This is concerning for possible ischemia. 3. Intermediate risk study.        Scheduled Meds: . ALPRAZolam  1 mg Oral BID  . amLODipine  10 mg Oral Daily  . aspirin EC  81 mg Oral Daily  . buPROPion  150 mg Oral Daily   And  . buPROPion  300 mg Oral QHS  . calcitRIOL  0.5 mcg Oral Daily  . calcium acetate  1,334 mg Oral TID WC  . cinacalcet  60 mg Oral Q breakfast  . cloNIDine  0.2 mg Transdermal Weekly  . darbepoetin (ARANESP) injection - NON-DIALYSIS  60 mcg Subcutaneous Q Sun-1800  . feeding supplement (NEPRO CARB STEADY)  237 mL Oral BID BM  . feeding supplement (PRO-STAT SUGAR FREE 64)  30 mL Oral BID  . gentamicin cream  1 application Topical Daily  . heparin  5,000 Units Subcutaneous Q8H  . hydrALAZINE  100 mg Oral BID  . isosorbide dinitrate  5 mg Oral BID  . labetalol  200 mg Oral BID  . pantoprazole  40 mg Oral Daily  . pravastatin  20 mg Oral Daily  . saccharomyces  boulardii  250 mg Oral BID  . sodium chloride flush  3 mL Intravenous Q12H  . Vitamin D (Ergocalciferol)  50,000 Units Oral Q7 days  . zolpidem  5 mg Oral QHS   Continuous Infusions: . sodium chloride    . dialysis solution 1.5% low-MG/low-CA    . piperacillin-tazobactam (ZOSYN)  IV 3.375 g (11/01/16 0853)  . vancomycin Stopped (10/30/16 1801)     LOS: 8 days    Time spent: 69 minutes    Jeffie Spivack, MD [name] Pager [phone]  If 7PM-7AM, please contact night-coverage [email or web address] Password [email or web address] 11/01/2016, 11:05 AM

## 2016-11-01 NOTE — Progress Notes (Signed)
Pharmacy Antibiotic Note  Hayden [name] is a 46 y.o. male admitted on 10/23/2016 with right foot wound/cellulitis that failed to respond to out dose of Dalvance 1500mg  IV on 10/10/16 and PO clindamycin. History of ESRD on PD at home. Pharmacy consulted to dose Zosyn and vancomycin.   Received vancomycin 2g IV x1 on 4/25 > vancomycin levels were monitored and were <57mcg/mL on 5/2.  Based on kinetics, patient's maintenance dose of vancomycin is 638mg  q7days, but due to ease of administration, will change to Vancomycin 500mg  IV q6days starting on 5/2.  Plan: -Continue Zosyn 3.375g IV q12h (infused over 4 hours) -Vancomycin 500mg  IV q6days (last given 5/2) -Patient refused PD treatment on 5/2 PM. Will follow for any other developments- may need to check level or consider moving next vancomycin dose to 5/9 (currently ordered for 5/8) -Follow vascular and ortho plans along with changes to PD prescription and length of therapy for antibiotics  Height: 5\' 10"  (177.8 cm) Weight: 187 lb (84.8 kg) IBW/kg (Calculated) : 73  Temp (24hrs), Avg:98.1 F (36.7 C), Min:97.6 F (36.4 C), Max:98.4 F (36.9 C)   Recent Labs Lab 10/27/16 0454 10/28/16 0444 10/29/16 0524 10/30/16 0614 10/31/16 0540 11/01/16 0425  WBC 14.4* 14.2*  --  13.2* 11.1* 13.2*  CREATININE 11.88* 11.88* 11.46* 11.77* 12.89* 12.80*  VANCORANDOM 26  --  22  --   --   --     Estimated Creatinine Clearance: 7.6 mL/min (A) (by C-G formula based on SCr of 12.8 mg/dL (H)).    Allergies  Allergen Reactions  . Dilaudid [Hydromorphone Hcl] Other (See Comments)    ABNORMAL BEHAVIORS "VERBALLY AND PHYSICALLY ABUSIVE" PSYCHOSIS  . Morphine And Related Itching and Other (See Comments)    SYNCOPE    Antimicrobials this admission: Dalvance 1500mg  IV once 10/10/16 at [facility]  Zosyn 4/25 >> vancomycin 4/25 >>    4/28 VR = 28 4/29 VR = 26 5/1 VR = 22  Microbiology results: 4/25 BCx: neg 4/26 MRSA PCR  neg   Thank you for allowing pharmacy to be a part of this patient's care.  Raylynne Cubbage D. Tanaya Dunigan, PharmD, BCPS Clinical Pharmacist Pager: [phone] 11/01/2016 11:56 AM

## 2016-11-01 NOTE — Progress Notes (Signed)
Progress Note  Patient Name: Hayden [name] Date of Encounter: 11/01/2016  Primary Cardiologist: new  Subjective   No angina or dyspnea. Right foot is very painful.  Inpatient Medications    Scheduled Meds: . ALPRAZolam  1 mg Oral BID  . amLODipine  10 mg Oral Daily  . aspirin EC  81 mg Oral Daily  . buPROPion  150 mg Oral Daily   And  . buPROPion  300 mg Oral QHS  . calcitRIOL  0.5 mcg Oral Daily  . calcium acetate  1,334 mg Oral TID WC  . cinacalcet  60 mg Oral Q breakfast  . cloNIDine  0.2 mg Transdermal Weekly  . darbepoetin (ARANESP) injection - NON-DIALYSIS  60 mcg Subcutaneous Q Sun-1800  . feeding supplement (NEPRO CARB STEADY)  237 mL Oral BID BM  . feeding supplement (PRO-STAT SUGAR FREE 64)  30 mL Oral BID  . gentamicin cream  1 application Topical Daily  . heparin  5,000 Units Subcutaneous Q8H  . hydrALAZINE  100 mg Oral BID  . isosorbide dinitrate  5 mg Oral BID  . labetalol  200 mg Oral BID  . pantoprazole  40 mg Oral Daily  . pravastatin  20 mg Oral Daily  . saccharomyces boulardii  250 mg Oral BID  . sodium chloride flush  3 mL Intravenous Q12H  . Vitamin D (Ergocalciferol)  50,000 Units Oral Q7 days  . zolpidem  5 mg Oral QHS   Continuous Infusions: . sodium chloride    . dialysis solution 1.5% low-MG/low-CA    . piperacillin-tazobactam (ZOSYN)  IV Stopped (11/01/16 1253)  . vancomycin Stopped (10/30/16 1801)   PRN Meds: sodium chloride, acetaminophen **OR** acetaminophen, fentaNYL (SUBLIMAZE) injection, dianeal solution for CAPD/CCPD with heparin, hydrALAZINE, hydrOXYzine, oxyCODONE, prochlorperazine, sodium chloride flush, technetium tetrofosmin   Vital Signs    Vitals:   10/31/16 2010 10/31/16 2306 11/01/16 0440 11/01/16 1055  BP: (!) 141/63 130/68 (!) 120/57 129/64  Pulse: 72 72 71 74  Resp: _0 Temp: 98.3 F (36.8 C) 97.8 F (36.6 C) 98.2 F (36.8 C) 98.4 F (36.9 C)  TempSrc: Oral Oral Oral Oral  SpO2: 96% 92% 97%     Weight: 88.4 kg (194 lb 14.2 oz)   84.8 kg (187 lb)  Height:        Intake/Output Summary (Last 24 hours) at 11/01/16 1352 Last data filed at 11/01/16 1100  Gross per 24 hour  Intake            14980 ml  Output            18266 ml  Net            -3286 ml   Filed Weights   10/30/16 2149 10/31/16 2010 11/01/16 1055  Weight: 88.9 kg (196 lb) 88.4 kg (194 lb 14.2 oz) 84.8 kg (187 lb)    Telemetry    NSR, PVCs - Personally Reviewed  Physical Exam  Constantly rubbing right foot GEN: No acute distress.   Neck: No JVD Cardiac: RRR, no murmurs, rubs, or gallops.  Respiratory: Clear to auscultation bilaterally. GI: Soft, nontender, non-distended  MS: g Neuro:  Nonfocal  Psych: Normal affect   Labs    Chemistry Recent Labs Lab 10/28/16 0444 10/29/16 0524 10/30/16 0614 10/31/16 0540 11/01/16 0425  NA 140 138 137 136 135  K 3.7 3.7 3.6 3.9 3.7  CL 98* 97* 94* 95* 93*  CO2 _1 20*  GLUCOSE 102* 86 93 69 110*  BUN 62* 55* 54* 61* 60*  CREATININE 11.88* 11.46* 11.77* 12.89* 12.80*  CALCIUM 7.0* 7.2* 7.4* 6.7* 7.1*  PROT 5.8*  --   --   --   --   ALBUMIN 2.0* 2.0*  --  1.7* 2.0*  AST 22  --   --   --   --   ALT 30  --   --   --   --   ALKPHOS 208*  --   --   --   --   BILITOT 0.7  --   --   --   --   GFRNONAA 5* 5* 5* 4* 4*  GFRAA 5* 5* 5* 5* 5*  ANIONGAP 15 16* 17* 17* 22*     Hematology Recent Labs Lab 10/30/16 0614 10/31/16 0540 11/01/16 0425  WBC 13.2* 11.1* 13.2*  RBC 4.17* 3.68* 3.81*  HGB 10.4* 8.9* 9.5*  HCT 33.0* 29.2* 30.3*  MCV 79.1 79.3 79.5  MCH 24.9* 24.2* 24.9*  MCHC 31.5 30.5 31.4  RDW 19.3* 18.9* 19.0*  PLT 588* 495* 520*    Radiology    Nm Myocar Multi W/spect W/wall Motion / Ef  Result Date: 10/31/2016  There was no ST segment deviation noted during stress.  Findings consistent with ischemia.  This is an intermediate risk study.  The left ventricular ejection fraction is mildly decreased (45-54%).  1. EF 43%, inferior  hypokinesis. 2. Reversible, medium sized, mild basal to mid inferior and basal inferolateral perfusion defect.  This is concerning for possible ischemia. 3. Intermediate risk study.    Cardiac Studies   11/01/2016 ECHO - Left ventricle: The cavity size was normal. There was severe   concentric hypertrophy. Systolic function was normal. The   estimated ejection fraction was in the range of 60% to 65%. Wall   motion was normal; there were no regional wall motion   abnormalities. Doppler parameters are consistent with   pseudonormal left ventricular relaxation (grade 2 diastolic   dysfunction). The E/e&' ratio is >30 with medial e&' velocity <6,   suggesting elevated LV filling pressure. - Aortic valve: Heavily calcified trileaflet aortic valve. Moderate   stenosis. Mean gradient (S): 16 mm Hg. Peak gradient (S): 29 mm   Hg. Valve area (VTI): 1.41 cm^2. Valve area (Vmax): 1.16 cm^2. - Mitral valve: Moderate posterior MAC. Mildly thickened leaflets.   Mild stenosis. Mean gradient (D): 8 mm Hg. Valve area by pressure   half-time: 1.9 cm^2. - Left atrium: The atrium was moderately dilated. - Right ventricle: The cavity size was normal. The moderator band   was prominent. Systolic function was normal. - Right atrium: The atrium was mildly dilated. - Tricuspid valve: There was mild regurgitation. - Pulmonary arteries: PA peak pressure: 35 mm Hg (S). - Inferior vena cava: The vessel was normal in size. The   respirophasic diameter changes were in the normal range (>= 50%),   consistent with normal central venous pressure.  Patient Profile     46 y.o. 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 Cardiology consult for preop eval before attempted limb salvage with R fempop bypass versus amputation.  Assessment & Plan    1. Preop CV risk is intermediate due to comorbid conditions, but noninvasive testing is reassuring. On my review  the inferolateral defect is questionable - even if present, the study is clearly low risk in my opinion. I believe  the EF on nuclear study is inaccurate. The echo is a good quality study with normal wall motion and EF. I do not think preop coronary angiography is indicated. It is very likely that he does have CAD, but does not have high risk anatomy. Any revascularization would unnecessarily delay the resolution of his vascular problem. Keep on ASA, bta blocker. 2. CHF: he has severe LVH and diastolic heart failure due to hypertensive cardiomyopathy. Peritoneal dialysis has made it easy to control hypervolemia. 3. HTN: good control on current regimen 4. HLP: target LDL<70. Not checked on this admission. On a weak statin. Needs to be reevaluated. 5. PAD: anatomy is unfortunately very unfavorable for limb salvage. 6. AS/MS: due to advanced degenerative changes due to renal failure, but not hemodynamically important yet. Will need yearly follow up. At risk for rapid progression. 8. ESRD on PD  Signed, Sanda Klein, MD  11/01/2016, 1:52 PM

## 2016-11-02 DIAGNOSIS — E876 Hypokalemia: Secondary | ICD-10-CM | POA: Clinically undetermined

## 2016-11-02 LAB — RENAL FUNCTION PANEL
ANION GAP: 19 — AB (ref 5–15)
Albumin: 2 g/dL — ABNORMAL LOW (ref 3.5–5.0)
BUN: 48 mg/dL — ABNORMAL HIGH (ref 6–20)
CALCIUM: 7.5 mg/dL — AB (ref 8.9–10.3)
CO2: 27 mmol/L (ref 22–32)
Chloride: 92 mmol/L — ABNORMAL LOW (ref 101–111)
Creatinine, Ser: 11.52 mg/dL — ABNORMAL HIGH (ref 0.61–1.24)
GFR calc non Af Amer: 5 mL/min — ABNORMAL LOW (ref >60)
GFR, EST AFRICAN AMERICAN: 5 mL/min — AB (ref >60)
GLUCOSE: 120 mg/dL — AB (ref 65–99)
PHOSPHORUS: 8 mg/dL — AB (ref 2.5–4.6)
POTASSIUM: 3.1 mmol/L — AB (ref 3.5–5.1)
Sodium: 138 mmol/L (ref 135–145)

## 2016-11-02 LAB — LIPID PANEL
CHOL/HDL RATIO: 2.1 ratio
CHOLESTEROL: 150 mg/dL (ref 0–200)
HDL: 72 mg/dL (ref >40)
LDL Cholesterol: 63 mg/dL (ref 0–99)
Triglycerides: 74 mg/dL (ref <150)
VLDL: 15 mg/dL (ref 0–40)

## 2016-11-02 MED ORDER — POTASSIUM CHLORIDE CRYS ER 20 MEQ PO TBCR: 40.0000 meq | EXTENDED_RELEASE_TABLET | Freq: Once | ORAL | Status: DC

## 2016-11-02 MED ORDER — POTASSIUM CHLORIDE CRYS ER 20 MEQ PO TBCR
30.0000 meq | EXTENDED_RELEASE_TABLET | Freq: Every day | ORAL | Status: DC
  Administered 2016-11-02 – 2016-11-04 (×3): 30 meq via ORAL
  Filled 2016-11-02 (×3): qty 1

## 2016-11-02 MED ORDER — DARBEPOETIN ALFA 100 MCG/0.5ML IJ SOSY
100.0000 ug | PREFILLED_SYRINGE | INTRAMUSCULAR | Status: DC
  Administered 2016-11-03: 100 ug via SUBCUTANEOUS
  Filled 2016-11-02: qty 0.5

## 2016-11-02 MED ORDER — POTASSIUM CHLORIDE CRYS ER 20 MEQ PO TBCR
40.0000 meq | EXTENDED_RELEASE_TABLET | Freq: Once | ORAL | Status: AC
  Administered 2016-11-02: 40 meq via ORAL
  Filled 2016-11-02: qty 2

## 2016-11-02 NOTE — Progress Notes (Signed)
Preoperative assessment per Dr Victorino December rounding note from yesterday. No additional cardiology recs at this time, may call over the weekend with questions.   Carlyle Dolly MD

## 2016-11-02 NOTE — Progress Notes (Signed)
PROGRESS NOTE    Hayden [name]  FUX:[id number] DOB: Sep 17, 1970 DOA: 10/23/2016 PCP: Corliss Parish, MD    Brief Narrative:  46 y.o.malewith medical history significant of ESRD on home peritoneal dialysis, HTN, CVA; who presents with complaints of right foot redness and swelling. The initial injury happened over one month ago when his foot caught in the track of the back seat of his sisters mini [name]. He initially triedto keep the area clean and washed. However, develop redness, swelling and pain. Approximately 2 weeks ago he was seen at the emergency department and was given 1 dose of vancomycin,but ended up leaving AMA. Patient saw his PCP who prescribed clindamycin, patient took for last 12-13 days however states that the pain continued to get worse and with the drainage from the wound.    Assessment & Plan:   Principal Problem:   Cellulitis of great toe of right foot Active Problems:   Hypertensive urgency, malignant   End stage renal disease on dialysis (HCC)   Anxiety and depression   Leukocytosis   Hyperkalemia   Pressure injury of skin   Gangrene of right foot (HCC)   QT prolongation   Hypokalemia  Cellulitis/dry gangrene of the right foot/Right great toe:  - failed treatment with outpatient antibiotics of clindamycin - Continue empiric antibiotics of vancomycin and Zosyn - Vascular surgery consulted, Arteriogram showed R SFA occlusion, unfortunately was not able to remain still for intervention. Per vascular surgery, may need to consider right BKA versus AKA if endovascular procedure to right SFA or possible bypass grafting not successful  - Patient status post anterograde right, femoral artery cannulation under ultrasound guidance, right leg runoff, angioplasty right superficial femoral artery per Dr. Bridgett Larsson 10/30/2016. -Appreciate Dr. Jess Barters evaluation -Continue current pain control management. Will place on OxyContin 15 mg twice daily in addition to oxycodone for  breakthrough pain. - Patient undergoing preop cardiac evaluation with Myoview stress test done 10/31/2016. Cardiology following. See below. - Vascular surgery following and will reassess the patient on Monday to discuss surgical options depending on cardiac risk stratification.  Active problems Hypertensive urgency: Blood pressures noted to be as high as 240/110 on admission. Patient noted not recently taking home blood pressure medications  - Blood pressure improved on current regimen of Norvasc, clonidine patch, hydralazine, labetalol, Isordil.  Leukocytosis: WBC elevated at 14.6. WBC fluctuating. Suspect secondary to above. - continue IV antibiotics per pharmacy  ESRD on peritoneal dialysis: Patient receives nightly peritoneal pulses dialysis. Initial BUN 102, creatinine 13.79. Patient noted to refuse peritoneal dialysis the night of 10/30/2016.  - Nephrology consulted for peritoneal dialysis - Continue Sensipar, calcitriol, andPhosLo - Per nephrology.  Hyperkalemia:Acute. Initial potassium 5.4 on admission. - Resolved with peritoneal dialysis.  Elevated anion gap likely secondary to uremia.   Elevatedalkaline phosphatase - Improving  Anxiety and depression - Anxiety improving, continue increased Xanax  dose. Patient's Paxil and Lexapro have been discontinued secondary to QTc prolongation. Psychiatric consultation pending for further recommendations and management of patient's depression and anxiety.  Prolonged QTC - Avoid QTC prolonging medications, discontinued Lexapro. Repeat EKG still with QTC prolongation. Magnesium level at 2.1. Discontinued patient's albuterol nebs, Zofran, Paxil. Repeat EKG still with QTC prolongation. Monitor. Cardiology following. Repeat EKG in the morning.  -Preoperative cardiac evaluation Cardiology was consulted for preop evaluation. 2-D echo with EF of 60-65% with no wall motion abnormalities. Patient with Myoview stress test(10/31/2016) that  shows intermediate risk with a EF of 43%, inferior hypokinesis, reversible medium-sized, mild basilar to  mid inferior and basilar inferolateral perfusion defect concerning for possible ischemia. Cardiology following and feels EF on nuclear studies and accurate and that echo done was a good quality study with normal wall motion abnormalities. A cardiology no preoperative coronary angiography is indicated at this time. Per cardiology.   DVT prophylaxis: Heparin Code Status: Full Family Communication: Updated patient and family at bedside. Disposition Plan: Per vascular surgery.   Consultants:   Cardiology : Dr.Croitoru 10/30/2016  Orthopedics: Dr. Sharol Given 10/28/2016  Vascular surgery: Dr. Bridgett Larsson 10/24/2016  Nephrology: Dr. Jeani Hawking 10/23/2016  Psychiatry pending  Procedures:   Antegrade right common femoral artery cannulation under ultrasound guidance, right leg runoff, angioplasty right superficial femoral artery per Dr. Bridgett Larsson 10/30/2016  Plain xray right foot 10/23/2016  Lower extremity vein mapping 10/28/2016  Lower extremity arterial duplex 10/25/2016  ABIs 10/25/2016--attempted  Abdominal aortogram with lower extremity 10/28/2016 Dr. Doren Custard  Myoview stress test 10/31/2016  2-D echo 11/01/2016  Antimicrobials:   Gentamicin cream 10/23/2016  IV Zosyn 10/23/2016  IV vancomycin 10/23/2016   Subjective: Patient sitting up in bed. Denies chest pain. Denies shortness of breath. Patient states some improvement with his pain with medication change.   Objective: Vitals:   11/02/16 0451 11/02/16 0800 11/02/16 1000 11/02/16 1700  BP: 128/84 120/80 125/71 122/68  Pulse: 74 72 72 71  Resp: 17  18 18   Temp: 97.8 F (36.6 C) 98 F (36.7 C) 97.8 F (36.6 C) 98 F (36.7 C)  TempSrc: Oral Oral Oral Oral  SpO2: 97%  97% 97%  Weight:  83.8 kg (184 lb 11.9 oz)    Height:        Intake/Output Summary (Last 24 hours) at 11/02/16 1921 Last data filed at 11/02/16 1349  Gross per 24  hour  Intake            15756 ml  Output            18152 ml  Net            -2396 ml   Filed Weights   11/01/16 1055 11/01/16 2204 11/02/16 0800  Weight: 84.8 kg (187 lb) 86.8 kg (191 lb 6.4 oz) 83.8 kg (184 lb 11.9 oz)    Examination:  General exam: Appears calm and comfortable  Respiratory system: Clear to auscultation. Respiratory effort normal. Cardiovascular system: S1 & S2 heard, RRR. 2-3/6 SEM. No JVD,rubs, gallops or clicks. No pedal edema. Gastrointestinal system: Abdomen is nondistended, soft and nontender. No organomegaly or masses felt. Normal bowel sounds heard. Central nervous system: Alert and oriented. No focal neurological deficits. Extremities: Right great toe with dry gangrenous ulcer. Skin: No rashes, lesions or ulcers Psychiatry: Judgement and insight appear fair -normal. Mood & affect appropriate.     Data Reviewed: I have personally reviewed following labs and imaging studies  CBC:  Recent Labs Lab 10/27/16 0454 10/28/16 0444 10/30/16 0614 10/31/16 0540 11/01/16 0425  WBC 14.4* 14.2* 13.2* 11.1* 13.2*  NEUTROABS  --   --   --  6.5 9.0*  HGB 9.8* 10.1* 10.4* 8.9* 9.5*  HCT 31.5* 31.5* 33.0* 29.2* 30.3*  MCV 78.8 79.5 79.1 79.3 79.5  PLT 501* 515* 588* 495* 734*   Basic Metabolic Panel:  Recent Labs Lab 10/29/16 0524 10/30/16 0614 10/31/16 0540 11/01/16 0425 11/02/16 0357  NA 138 137 136 135 138  K 3.7 3.6 3.9 3.7 3.1*  CL 97* 94* 95* 93* 92*  CO2 25 26 24  20* 27  GLUCOSE 86 93 69 110* 120*  BUN 55* 54* 61* 60* 48*  CREATININE 11.46* 11.77* 12.89* 12.80* 11.52*  CALCIUM 7.2* 7.4* 6.7* 7.1* 7.5*  MG  --   --  2.1 2.1  --   PHOS 9.1*  --  9.4* 8.7* 8.0*   GFR: Estimated Creatinine Clearance: 8.4 mL/min (A) (by C-G formula based on SCr of 11.52 mg/dL (H)). Liver Function Tests:  Recent Labs Lab 10/28/16 0444 10/29/16 0524 10/31/16 0540 11/01/16 0425 11/02/16 0357  AST 22  --   --   --   --   ALT 30  --   --   --   --     ALKPHOS 208*  --   --   --   --   BILITOT 0.7  --   --   --   --   PROT 5.8*  --   --   --   --   ALBUMIN 2.0* 2.0* 1.7* 2.0* 2.0*   No results for input(s): LIPASE, AMYLASE in the last 168 hours. No results for input(s): AMMONIA in the last 168 hours. Coagulation Profile:  Recent Labs Lab 10/30/16 0614  INR 1.24   Cardiac Enzymes: No results for input(s): CKTOTAL, CKMB, CKMBINDEX, TROPONINI in the last 168 hours. BNP (last 3 results) No results for input(s): PROBNP in the last 8760 hours. HbA1C: No results for input(s): HGBA1C in the last 72 hours. CBG:  Recent Labs Lab 10/30/16 1050  GLUCAP 78   Lipid Profile:  Recent Labs  11/02/16 0357  CHOL 150  HDL 72  LDLCALC 63  TRIG 74  CHOLHDL 2.1   Thyroid Function Tests: No results for input(s): TSH, T4TOTAL, FREET4, T3FREE, THYROIDAB in the last 72 hours. Anemia Panel: No results for input(s): VITAMINB12, FOLATE, FERRITIN, TIBC, IRON, RETICCTPCT in the last 72 hours. Sepsis Labs: No results for input(s): PROCALCITON, LATICACIDVEN in the last 168 hours.  Recent Results (from the past 240 hour(s))  MRSA PCR Screening     Status: None   Collection Time: 10/24/16  5:03 AM  Result Value Ref Range Status   MRSA by PCR NEGATIVE NEGATIVE Final    Comment:        The GeneXpert MRSA Assay (FDA approved for NASAL specimens only), is one component of a comprehensive MRSA colonization surveillance program. It is not intended to diagnose MRSA infection nor to guide or monitor treatment for MRSA infections.   Surgical pcr screen     Status: None   Collection Time: 10/30/16  6:21 AM  Result Value Ref Range Status   MRSA, PCR NEGATIVE NEGATIVE Final   Staphylococcus aureus NEGATIVE NEGATIVE Final    Comment:        The Xpert SA Assay (FDA approved for NASAL specimens in patients over 81 years of age), is one component of a comprehensive surveillance program.  Test performance has been validated by [facility]  for patients greater than or equal to 65 year old. It is not intended to diagnose infection nor to guide or monitor treatment.          Radiology Studies: No results found.      Scheduled Meds: . ALPRAZolam  1 mg Oral BID  . amLODipine  10 mg Oral Daily  . aspirin EC  81 mg Oral Daily  . buPROPion  150 mg Oral Daily   And  . buPROPion  300 mg Oral QHS  . calcitRIOL  0.5 mcg Oral Daily  . calcium acetate  1,334 mg Oral TID WC  .  cinacalcet  60 mg Oral Q breakfast  . cloNIDine  0.2 mg Transdermal Weekly  . [START ON 11/03/2016] darbepoetin (ARANESP) injection - NON-DIALYSIS  100 mcg Subcutaneous Q Sun-1800  . feeding supplement (NEPRO CARB STEADY)  237 mL Oral BID BM  . feeding supplement (PRO-STAT SUGAR FREE 64)  30 mL Oral BID  . gentamicin cream  1 application Topical Daily  . heparin  5,000 Units Subcutaneous Q8H  . hydrALAZINE  100 mg Oral BID  . isosorbide dinitrate  5 mg Oral BID  . labetalol  200 mg Oral BID  . oxyCODONE  15 mg Oral Q12H  . pantoprazole  40 mg Oral Daily  . potassium chloride  30 mEq Oral Daily  . pravastatin  20 mg Oral Daily  . saccharomyces boulardii  250 mg Oral BID  . sodium chloride flush  3 mL Intravenous Q12H  . Vitamin D (Ergocalciferol)  50,000 Units Oral Q7 days  . zolpidem  5 mg Oral QHS   Continuous Infusions: . sodium chloride    . dialysis solution 1.5% low-MG/low-CA    . piperacillin-tazobactam (ZOSYN)  IV Stopped (11/02/16 1316)  . vancomycin Stopped (10/30/16 1801)     LOS: 9 days    Time spent: 35 minutes    Horton Ellithorpe, MD [name] Pager [phone]  If 7PM-7AM, please contact night-coverage [email or web address] Password [email or web address] 11/02/2016, 7:21 PM

## 2016-11-02 NOTE — Progress Notes (Signed)
[facility] Progress Note   Subjective:  "I'm am doing OK today". Still apologizing for refusing PD earlier in week. Ongoing issues with foot pain but appears comfortable at present.   Objective Vitals:   11/01/16 2204 11/02/16 0451 11/02/16 0800 11/02/16 1000  BP: 140/70 128/84 120/80 125/71  Pulse: 78 74 72 (!) 18  Resp: 16 17  18   Temp: 97.9 F (36.6 C) 97.8 F (36.6 C) 98 F (36.7 C) 97.8 F (36.6 C)  TempSrc: Oral Oral Oral Oral  SpO2: 93% 97%  97%  Weight: 86.8 kg (191 lb 6.4 oz)  83.8 kg (184 lb 11.9 oz)   Height:       Physical Exam General: Alert, pleasant. NAD Heart: S1,S2, RRR  Lungs: CTAB A/P Abdomen: Soft non-tender. LUQ PD cath drsg intact.  Extremities: No LE edema. Wounds R great toe, dorsal inferior aspect of R foot.   Dialysis Access: LFA AVF + bruit. PD cath.    Additional Objective Labs: Basic Metabolic Panel:  Recent Labs Lab 10/31/16 0540 11/01/16 0425 11/02/16 0357  NA 136 135 138  K 3.9 3.7 3.1*  CL 95* 93* 92*  CO2 24 20* 27  GLUCOSE 69 110* 120*  BUN 61* 60* 48*  CREATININE 12.89* 12.80* 11.52*  CALCIUM 6.7* 7.1* 7.5*  PHOS 9.4* 8.7* 8.0*   Liver Function Tests:  Recent Labs Lab 10/28/16 0444  10/31/16 0540 11/01/16 0425 11/02/16 0357  AST 22  --   --   --   --   ALT 30  --   --   --   --   ALKPHOS 208*  --   --   --   --   BILITOT 0.7  --   --   --   --   PROT 5.8*  --   --   --   --   ALBUMIN 2.0*  < > 1.7* 2.0* 2.0*  < > = values in this interval not displayed. No results for input(s): LIPASE, AMYLASE in the last 168 hours. CBC:  Recent Labs Lab 10/27/16 0454 10/28/16 0444 10/30/16 0614 10/31/16 0540 11/01/16 0425  WBC 14.4* 14.2* 13.2* 11.1* 13.2*  NEUTROABS  --   --   --  6.5 9.0*  HGB 9.8* 10.1* 10.4* 8.9* 9.5*  HCT 31.5* 31.5* 33.0* 29.2* 30.3*  MCV 78.8 79.5 79.1 79.3 79.5  PLT 501* 515* 588* 495* 520*   Blood Culture    Component Value Date/Time   SDES BLOOD RIGHT ANTECUBITAL  10/23/2016 1826   SPECREQUEST  10/23/2016 1826    BOTTLES DRAWN AEROBIC AND ANAEROBIC Blood Culture adequate volume   CULT NO GROWTH 5 DAYS 10/23/2016 1826   REPTSTATUS 10/28/2016 FINAL 10/23/2016 1826     CBG:  Recent Labs Lab 10/30/16 1050  GLUCAP 78   Studies/Results: Nm Myocar Multi W/spect W/wall Motion / Ef  Result Date: 10/31/2016  There was no ST segment deviation noted during stress.  Findings consistent with ischemia.  This is an intermediate risk study.  The left ventricular ejection fraction is mildly decreased (45-54%).  1. EF 43%, inferior hypokinesis. 2. Reversible, medium sized, mild basal to mid inferior and basal inferolateral perfusion defect.  This is concerning for possible ischemia. 3. Intermediate risk study.   Medications: . sodium chloride    . dialysis solution 1.5% low-MG/low-CA    . piperacillin-tazobactam (ZOSYN)  IV 3.375 g (11/02/16 0916)  . vancomycin Stopped (10/30/16 1801)   . ALPRAZolam  1 mg Oral  BID  . amLODipine  10 mg Oral Daily  . aspirin EC  81 mg Oral Daily  . buPROPion  150 mg Oral Daily   And  . buPROPion  300 mg Oral QHS  . calcitRIOL  0.5 mcg Oral Daily  . calcium acetate  1,334 mg Oral TID WC  . cinacalcet  60 mg Oral Q breakfast  . cloNIDine  0.2 mg Transdermal Weekly  . darbepoetin (ARANESP) injection - NON-DIALYSIS  60 mcg Subcutaneous Q Sun-1800  . feeding supplement (NEPRO CARB STEADY)  237 mL Oral BID BM  . feeding supplement (PRO-STAT SUGAR FREE 64)  30 mL Oral BID  . gentamicin cream  1 application Topical Daily  . heparin  5,000 Units Subcutaneous Q8H  . hydrALAZINE  100 mg Oral BID  . isosorbide dinitrate  5 mg Oral BID  . labetalol  200 mg Oral BID  . oxyCODONE  15 mg Oral Q12H  . pantoprazole  40 mg Oral Daily  . potassium chloride  30 mEq Oral Daily  . pravastatin  20 mg Oral Daily  . saccharomyces boulardii  250 mg Oral BID  . sodium chloride flush  3 mL Intravenous Q12H  . Vitamin D (Ergocalciferol)   50,000 Units Oral Q7 days  . zolpidem  5 mg Oral QHS   Dialysis Orders: [place] [facility].  CCPD: 3L fill 5 exchanges at night with 5th fill manually drained 1-1/2 hours later. Uses mostly 1's and 2's at home.   Assessment/Plan: 1. Ischemic ulcers/gangrene R Foot/Severe PAD. To OR next week.  2. ESRD - Continue CPPD. K+ 3.1. Has been getting daily K+ replacement, replete with extra dose 40 MEQ KDUR X 1 dose this afternoon.  3. Anemia - HGB 9.5. Last ESA Dose 10/27/16. Dose due tomorrow.  4. Secondary hyperparathyroidism -Continue binders/VDRA/sensipar. Phos 8.0 Ca 7.5. Needs to take binders.  5. HTN/volume - BP controlled. Wt continues to fall. Use all 1.5% solutions for exchanges.  6. Nutrition -Albumin 2.0 renal diet w/fluid restrictions. Low K+ would iberalize diet but has high Phos.   Rita H. Brown NP-C 11/02/2016, 10:37 AM  [facility] [phone]  Pt seen, examined and agree w A/P as above.  Kelly Splinter MD [employer] pager [phone]   11/02/2016, 2:01 PM

## 2016-11-03 LAB — RENAL FUNCTION PANEL
Albumin: 2 g/dL — ABNORMAL LOW (ref 3.5–5.0)
Anion gap: 18 — ABNORMAL HIGH (ref 5–15)
BUN: 42 mg/dL — ABNORMAL HIGH (ref 6–20)
CO2: 26 mmol/L (ref 22–32)
Calcium: 7.8 mg/dL — ABNORMAL LOW (ref 8.9–10.3)
Chloride: 94 mmol/L — ABNORMAL LOW (ref 101–111)
Creatinine, Ser: 11.41 mg/dL — ABNORMAL HIGH (ref 0.61–1.24)
GFR calc Af Amer: 5 mL/min — ABNORMAL LOW
GFR calc non Af Amer: 5 mL/min — ABNORMAL LOW
Glucose, Bld: 120 mg/dL — ABNORMAL HIGH (ref 65–99)
Phosphorus: 6.9 mg/dL — ABNORMAL HIGH (ref 2.5–4.6)
Potassium: 3.4 mmol/L — ABNORMAL LOW (ref 3.5–5.1)
Sodium: 138 mmol/L (ref 135–145)

## 2016-11-03 LAB — CBC
HEMATOCRIT: 32.7 % — AB (ref 39.0–52.0)
Hemoglobin: 10.2 g/dL — ABNORMAL LOW (ref 13.0–17.0)
MCH: 24.8 pg — AB (ref 26.0–34.0)
MCHC: 31.2 g/dL (ref 30.0–36.0)
MCV: 79.4 fL (ref 78.0–100.0)
Platelets: 543 10*3/uL — ABNORMAL HIGH (ref 150–400)
RBC: 4.12 MIL/uL — ABNORMAL LOW (ref 4.22–5.81)
RDW: 19.1 % — AB (ref 11.5–15.5)
WBC: 14 10*3/uL — ABNORMAL HIGH (ref 4.0–10.5)

## 2016-11-03 LAB — GLUCOSE, CAPILLARY: Glucose-Capillary: 84 mg/dL (ref 65–99)

## 2016-11-03 MED ORDER — LORAZEPAM 2 MG/ML IJ SOLN
1.0000 mg | Freq: Once | INTRAMUSCULAR | Status: AC
  Administered 2016-11-03: 1 mg via INTRAVENOUS
  Filled 2016-11-03: qty 1

## 2016-11-03 MED ORDER — LORAZEPAM 2 MG/ML IJ SOLN
1.0000 mg | Freq: Four times a day (QID) | INTRAMUSCULAR | Status: DC | PRN
  Administered 2016-11-04: 1 mg via INTRAVENOUS
  Filled 2016-11-03 (×5): qty 1

## 2016-11-03 NOTE — Progress Notes (Signed)
Previous shift RN made this RN aware that patient pulled IV out. IV team consult placed. IV RN attempted to place IV and patient became restless, combative and agitated. On call NP, Schorr, notified. RN awaiting orders.   Ermalinda Memos, RN

## 2016-11-03 NOTE — Progress Notes (Signed)
I arrived in room and found that patient pulled his toenail off. Patient states that it fell off, but wife states that he pulled it off. No bleeding noted. Toe wrapped with gauze. Will continue to monitor.

## 2016-11-03 NOTE — Progress Notes (Signed)
[facility] Progress Note   Subjective:   "I'm doing OK." no issues reported during night  Objective Vitals:   11/02/16 1000 11/02/16 1700 11/02/16 2133 11/03/16 0621  BP: 125/71 122/68 (!) 155/73 (!) 152/78  Pulse: 72 71 82 81  Resp: 18 18 18 18   Temp: 97.8 F (36.6 C) 98 F (36.7 C) 97.8 F (36.6 C) 98.5 F (36.9 C)  TempSrc: Oral Oral Oral Oral  SpO2: 97% 97% 92% 96%  Weight:   89.1 kg (196 lb 6.9 oz)   Height:       Physical Exam General: alert, pleasant, NAD Heart: S1,S2, RRR Lungs: CTAB A/P Abdomen: Active Bs LUQ PD cath drsg changed per protocol. Site unremarkabl.e Extremities: No LE edema. Wound R toe and dorsal aspect of R foot. R foot cool/painful to touch.  Dialysis Access: LFA AVF + bruit   Additional Objective Labs: Basic Metabolic Panel:  Recent Labs Lab 11/01/16 0425 11/02/16 0357 11/03/16 0412  NA 135 138 138  K 3.7 3.1* 3.4*  CL 93* 92* 94*  CO2 20* 27 26  GLUCOSE 110* 120* 120*  BUN 60* 48* 42*  CREATININE 12.80* 11.52* 11.41*  CALCIUM 7.1* 7.5* 7.8*  PHOS 8.7* 8.0* 6.9*   Liver Function Tests:  Recent Labs Lab 10/28/16 0444  11/01/16 0425 11/02/16 0357 11/03/16 0412  AST 22  --   --   --   --   ALT 30  --   --   --   --   ALKPHOS 208*  --   --   --   --   BILITOT 0.7  --   --   --   --   PROT 5.8*  --   --   --   --   ALBUMIN 2.0*  < > 2.0* 2.0* 2.0*  < > = values in this interval not displayed. No results for input(s): LIPASE, AMYLASE in the last 168 hours. CBC:  Recent Labs Lab 10/28/16 0444 10/30/16 0614 10/31/16 0540 11/01/16 0425 11/03/16 0412  WBC 14.2* 13.2* 11.1* 13.2* 14.0*  NEUTROABS  --   --  6.5 9.0*  --   HGB 10.1* 10.4* 8.9* 9.5* 10.2*  HCT 31.5* 33.0* 29.2* 30.3* 32.7*  MCV 79.5 79.1 79.3 79.5 79.4  PLT 515* 588* 495* 520* 543*   Blood Culture    Component Value Date/Time   SDES BLOOD RIGHT ANTECUBITAL 10/23/2016 1826   SPECREQUEST  10/23/2016 1826    BOTTLES DRAWN AEROBIC AND  ANAEROBIC Blood Culture adequate volume   CULT NO GROWTH 5 DAYS 10/23/2016 1826   REPTSTATUS 10/28/2016 FINAL 10/23/2016 1826    Cardiac Enzymes: No results for input(s): CKTOTAL, CKMB, CKMBINDEX, TROPONINI in the last 168 hours. CBG:  Recent Labs Lab 10/30/16 1050  GLUCAP 78   Iron Studies: No results for input(s): IRON, TIBC, TRANSFERRIN, FERRITIN in the last 72 hours. @lablastinr3 @ Studies/Results: No results found. Medications: . sodium chloride    . dialysis solution 1.5% low-MG/low-CA    . piperacillin-tazobactam (ZOSYN)  IV Stopped (11/03/16 0154)  . vancomycin Stopped (10/30/16 1801)   . ALPRAZolam  1 mg Oral BID  . amLODipine  10 mg Oral Daily  . aspirin EC  81 mg Oral Daily  . buPROPion  150 mg Oral Daily   And  . buPROPion  300 mg Oral QHS  . calcitRIOL  0.5 mcg Oral Daily  . calcium acetate  1,334 mg Oral TID WC  . cinacalcet  60 mg Oral  Q breakfast  . cloNIDine  0.2 mg Transdermal Weekly  . darbepoetin (ARANESP) injection - NON-DIALYSIS  100 mcg Subcutaneous Q Sun-1800  . feeding supplement (NEPRO CARB STEADY)  237 mL Oral BID BM  . feeding supplement (PRO-STAT SUGAR FREE 64)  30 mL Oral BID  . gentamicin cream  1 application Topical Daily  . heparin  5,000 Units Subcutaneous Q8H  . hydrALAZINE  100 mg Oral BID  . isosorbide dinitrate  5 mg Oral BID  . labetalol  200 mg Oral BID  . oxyCODONE  15 mg Oral Q12H  . pantoprazole  40 mg Oral Daily  . potassium chloride  30 mEq Oral Daily  . pravastatin  20 mg Oral Daily  . saccharomyces boulardii  250 mg Oral BID  . sodium chloride flush  3 mL Intravenous Q12H  . Vitamin D (Ergocalciferol)  50,000 Units Oral Q7 days  . zolpidem  5 mg Oral QHS     Dialysis Orders: [facility].  CCPD: 3L fill 5 exchanges at night with 5th fill manually drained 1-1/2 hours later. Uses mostly 1's and 2's at home.   Assessment/Plan: 1. Ischemic ulcers/gangrene R Foot/Severe PAD. To OR next week. No changes  overnight. Continue empiric van/Zosyn. Cardiology consulted for risk stratification pre op.  2. ESRD - Continue CPPD. K+ 3.4. Continue daily K+ replacement, Follow labs 3. Anemia - HGB 10.2. Last ESA Dose 10/27/16. Dose due today  4. Secondary hyperparathyroidism -Continue binders/VDRA/sensipar. Ca 7.8 C Ca 9.4 Phos improved to 6.9. Needs to take binders.  5. HTN/volume - BP controlled. Wt fell to 83.8 now back up 89.1 kg. Get standing wt this AM- actually 83.4 kg. Use all 1.5% solutions for exchanges. Standing wts only.  6. Nutrition -Albumin 2.0 renal diet w/fluid restrictions. Low K+ would iberalize diet but has high Phos.  7. Abnormal myoview stress test: EF 43%. Reversible, medium sized, mild basal to mid inferior and basal inferolateral perfusion defect concerning for ischemia. Per cards nuc medicine study is inaccurate, no plans for further investigation.     Rita H. Brown NP-C 11/03/2016, 8:40 AM  [facility] [phone]  Pt seen, examined and agree w A/P as above.  Kelly Splinter MD [employer] pager [phone]   11/03/2016, 3:18 PM

## 2016-11-03 NOTE — Significant Event (Signed)
Rapid Response Event Note  Overview: Called by bedside RN d/t pt confusion, combativeness. Time Called: 2018 Arrival Time: 2021 Event Type: Neurologic  Initial Focused Assessment: Pt sitting up in bed, very confused and very irritable.  Alert to self only.  NIH-2.  Pt didn't know age or month. CBG-84. Pt thrashing arms and legs at times and blurting out curse words.  Pt c/o pain in R foot. VSS: T-98.7, HR-90, BP-154/75, RR-16, SpO2-97% on RA.   Interventions: Schorr, NP notified PTA RRT and ordered Ativan 1mg  IV.  Pt had pulled out IV prior but agreed, after discussion, to let IV team start another one. IVT started IV and Ativan given.  Plan of Care (if not transferred): Monitor pt.  Ativan 1mg  ordered q6h IV prn.  Call MD if sitter needed.  Call RRT if further assistance needed.  Event Summary: Name of Physician Notified: Schorr, NP at  (PTA RRT)    at    Outcome: Stayed in room and stabalized     [place], Sharyn [employer]

## 2016-11-03 NOTE — Progress Notes (Signed)
PROGRESS NOTE    Hayden [name]  ZOX:[id number] DOB: 1970/11/08 DOA: 10/23/2016 PCP: Corliss Parish, MD    Brief Narrative:  46 y.o.malewith medical history significant of ESRD on home peritoneal dialysis, HTN, CVA; who presents with complaints of right foot redness and swelling. The initial injury happened over one month ago when his foot caught in the track of the back seat of his sisters mini [name]. He initially triedto keep the area clean and washed. However, develop redness, swelling and pain. Approximately 2 weeks ago he was seen at the emergency department and was given 1 dose of vancomycin,but ended up leaving AMA. Patient saw his PCP who prescribed clindamycin, patient took for last 12-13 days however states that the pain continued to get worse and with the drainage from the wound.    Assessment & Plan:   Principal Problem:   Cellulitis of great toe of right foot Active Problems:   Hypertensive urgency, malignant   End stage renal disease on dialysis (HCC)   Anxiety and depression   Leukocytosis   Hyperkalemia   Pressure injury of skin   Gangrene of right foot (HCC)   QT prolongation   Hypokalemia  Cellulitis/dry gangrene of the right foot/Right great toe:  - failed treatment with outpatient antibiotics of clindamycin - Continue empiric antibiotics of vancomycin and Zosyn - Vascular surgery consulted, Arteriogram showed R SFA occlusion, unfortunately was not able to remain still for intervention. Per vascular surgery, may need to consider right BKA versus AKA if endovascular procedure to right SFA or possible bypass grafting not successful  - Patient status post anterograde right, femoral artery cannulation under ultrasound guidance, right leg runoff, angioplasty right superficial femoral artery per Dr. Bridgett Larsson 10/30/2016. -Appreciate Dr. Jess Barters evaluation -Continue current pain control management. Will place on OxyContin 15 mg twice daily in addition to oxycodone for  breakthrough pain. - s/p evaluation with Myoview stress test done 10/31/2016. Cardiology saw and recommends no further intervention prior to surgery. - Vascular surgery following and will reassess the patient on Monday to discuss surgical options.   Hypertensive urgency: Blood pressures noted to be as high as 240/110 on admission. Patient noted not recently taking home blood pressure medications  - Blood pressure improved on current regimen of Norvasc, clonidine patch, hydralazine, labetalol, Isordil.  Leukocytosis: WBC elevated at 14.6. WBC fluctuating. Suspect secondary to above. - continue IV antibiotics per pharmacy  ESRD on peritoneal dialysis: Patient receives nightly peritoneal pulses dialysis. Initial BUN 102, creatinine 13.79. Patient noted to refuse peritoneal dialysis the night of 10/30/2016.  - Nephrology consulted for peritoneal dialysis - Continue Sensipar, calcitriol, andPhosLo - Per nephrology.  Hyperkalemia:Acute. Initial potassium 5.4 on admission. - Resolved with peritoneal dialysis.  Elevated anion gap likely secondary to uremia.   Elevatedalkaline phosphatase - Improving  Anxiety and depression - Anxiety improving, continue increased Xanax  dose. Patient's Paxil and Lexapro have been discontinued secondary to QTc prolongation.  Prolonged QTC - Avoid QTC prolonging medications, discontinued Lexapro. Repeat EKG still with QTC prolongation. Magnesium level at 2.1. Discontinued patient's albuterol nebs, Zofran, Paxil. Repeat EKG still with QTC prolongation. Monitor. Cardiology following. Repeat EKG in the morning.  -Preoperative cardiac evaluation Cardiology was consulted for preop evaluation. 2-D echo with EF of 60-65% with no wall motion abnormalities. Patient with Myoview stress test(10/31/2016) that shows intermediate risk with a EF of 43%, inferior hypokinesis, reversible medium-sized, mild basilar to mid inferior and basilar inferolateral perfusion  defect concerning for possible ischemia. Cardiology following and feels EF  on nuclear studies and accurate and that echo done was a good quality study with normal wall motion abnormalities.Per cardiology no preoperative coronary angiography is indicated at this time.    DVT prophylaxis: Heparin Code Status: Full Family Communication: Updated patient and family at bedside. Disposition Plan: Per vascular surgery.   Consultants:   Cardiology : Dr.Croitoru 10/30/2016  Orthopedics: Dr. Sharol Given 10/28/2016  Vascular surgery: Dr. Bridgett Larsson 10/24/2016  Nephrology: Dr. Jeani Hawking 10/23/2016  Psychiatry pending  Procedures:   Antegrade right common femoral artery cannulation under ultrasound guidance, right leg runoff, angioplasty right superficial femoral artery per Dr. Bridgett Larsson 10/30/2016  Plain xray right foot 10/23/2016  Lower extremity vein mapping 10/28/2016  Lower extremity arterial duplex 10/25/2016  ABIs 10/25/2016--attempted  Abdominal aortogram with lower extremity 10/28/2016 Dr. Doren Custard  Myoview stress test 10/31/2016  2-D echo 11/01/2016  Antimicrobials:   Gentamicin cream 10/23/2016  IV Zosyn 10/23/2016  IV vancomycin 10/23/2016   Subjective: Patient asking multiple questions about what surgery he will have and when   Objective: Vitals:   11/02/16 1000 11/02/16 1700 11/02/16 2133 11/03/16 0621  BP: 125/71 122/68 (!) 155/73 (!) 152/78  Pulse: 72 71 82 81  Resp: 18 18 18 18   Temp: 97.8 F (36.6 C) 98 F (36.7 C) 97.8 F (36.6 C) 98.5 F (36.9 C)  TempSrc: Oral Oral Oral Oral  SpO2: 97% 97% 92% 96%  Weight:   89.1 kg (196 lb 6.9 oz)   Height:        Intake/Output Summary (Last 24 hours) at 11/03/16 0856 Last data filed at 11/03/16 7591  Gross per 24 hour  Intake              483 ml  Output                0 ml  Net              483 ml   Filed Weights   11/01/16 2204 11/02/16 0800 11/02/16 2133  Weight: 86.8 kg (191 lb 6.4 oz) 83.8 kg (184 lb 11.9 oz) 89.1 kg (196  lb 6.9 oz)    Examination:  General exam: Anxious appearing Respiratory system: No increased work of breathing, clear, no wheezing Cardiovascular system: rrr, no edema in LE Gastrointestinal system: Abdomen is nondistended, soft and nontender. No organomegaly or masses felt. Normal bowel sounds heard. Central nervous system: Alert and oriented. No focal neurological deficits. Extremities: right big toe with erythema surrounding dry gangrene-- foul odor not noticed Skin: No rashes, lesions or ulcers Psychiatry: anxious    Data Reviewed: I have personally reviewed following labs and imaging studies  CBC:  Recent Labs Lab 10/28/16 0444 10/30/16 0614 10/31/16 0540 11/01/16 0425 11/03/16 0412  WBC 14.2* 13.2* 11.1* 13.2* 14.0*  NEUTROABS  --   --  6.5 9.0*  --   HGB 10.1* 10.4* 8.9* 9.5* 10.2*  HCT 31.5* 33.0* 29.2* 30.3* 32.7*  MCV 79.5 79.1 79.3 79.5 79.4  PLT 515* 588* 495* 520* 638*   Basic Metabolic Panel:  Recent Labs Lab 10/29/16 0524 10/30/16 0614 10/31/16 0540 11/01/16 0425 11/02/16 0357 11/03/16 0412  NA 138 137 136 135 138 138  K 3.7 3.6 3.9 3.7 3.1* 3.4*  CL 97* 94* 95* 93* 92* 94*  CO2 25 26 24  20* 27 26  GLUCOSE 86 93 69 110* 120* 120*  BUN 55* 54* 61* 60* 48* 42*  CREATININE 11.46* 11.77* 12.89* 12.80* 11.52* 11.41*  CALCIUM 7.2* 7.4* 6.7* 7.1* 7.5* 7.8*  MG  --   --  2.1 2.1  --   --   PHOS 9.1*  --  9.4* 8.7* 8.0* 6.9*   GFR: Estimated Creatinine Clearance: 9.3 mL/min (A) (by C-G formula based on SCr of 11.41 mg/dL (H)). Liver Function Tests:  Recent Labs Lab 10/28/16 0444 10/29/16 0524 10/31/16 0540 11/01/16 0425 11/02/16 0357 11/03/16 0412  AST 22  --   --   --   --   --   ALT 30  --   --   --   --   --   ALKPHOS 208*  --   --   --   --   --   BILITOT 0.7  --   --   --   --   --   PROT 5.8*  --   --   --   --   --   ALBUMIN 2.0* 2.0* 1.7* 2.0* 2.0* 2.0*   No results for input(s): LIPASE, AMYLASE in the last 168 hours. No results  for input(s): AMMONIA in the last 168 hours. Coagulation Profile:  Recent Labs Lab 10/30/16 0614  INR 1.24   Cardiac Enzymes: No results for input(s): CKTOTAL, CKMB, CKMBINDEX, TROPONINI in the last 168 hours. BNP (last 3 results) No results for input(s): PROBNP in the last 8760 hours. HbA1C: No results for input(s): HGBA1C in the last 72 hours. CBG:  Recent Labs Lab 10/30/16 1050  GLUCAP 78   Lipid Profile:  Recent Labs  11/02/16 0357  CHOL 150  HDL 72  LDLCALC 63  TRIG 74  CHOLHDL 2.1   Thyroid Function Tests: No results for input(s): TSH, T4TOTAL, FREET4, T3FREE, THYROIDAB in the last 72 hours. Anemia Panel: No results for input(s): VITAMINB12, FOLATE, FERRITIN, TIBC, IRON, RETICCTPCT in the last 72 hours. Sepsis Labs: No results for input(s): PROCALCITON, LATICACIDVEN in the last 168 hours.  Recent Results (from the past 240 hour(s))  Surgical pcr screen     Status: None   Collection Time: 10/30/16  6:21 AM  Result Value Ref Range Status   MRSA, PCR NEGATIVE NEGATIVE Final   Staphylococcus aureus NEGATIVE NEGATIVE Final    Comment:        The Xpert SA Assay (FDA approved for NASAL specimens in patients over 1 years of age), is one component of a comprehensive surveillance program.  Test performance has been validated by [facility] for patients greater than or equal to 25 year old. It is not intended to diagnose infection nor to guide or monitor treatment.          Radiology Studies: No results found.      Scheduled Meds: . ALPRAZolam  1 mg Oral BID  . amLODipine  10 mg Oral Daily  . aspirin EC  81 mg Oral Daily  . buPROPion  150 mg Oral Daily   And  . buPROPion  300 mg Oral QHS  . calcitRIOL  0.5 mcg Oral Daily  . calcium acetate  1,334 mg Oral TID WC  . cinacalcet  60 mg Oral Q breakfast  . cloNIDine  0.2 mg Transdermal Weekly  . darbepoetin (ARANESP) injection - NON-DIALYSIS  100 mcg Subcutaneous Q Sun-1800  . feeding  supplement (NEPRO CARB STEADY)  237 mL Oral BID BM  . feeding supplement (PRO-STAT SUGAR FREE 64)  30 mL Oral BID  . gentamicin cream  1 application Topical Daily  . heparin  5,000 Units Subcutaneous Q8H  . hydrALAZINE  100 mg Oral BID  .  isosorbide dinitrate  5 mg Oral BID  . labetalol  200 mg Oral BID  . oxyCODONE  15 mg Oral Q12H  . pantoprazole  40 mg Oral Daily  . potassium chloride  30 mEq Oral Daily  . pravastatin  20 mg Oral Daily  . saccharomyces boulardii  250 mg Oral BID  . sodium chloride flush  3 mL Intravenous Q12H  . Vitamin D (Ergocalciferol)  50,000 Units Oral Q7 days  . zolpidem  5 mg Oral QHS   Continuous Infusions: . sodium chloride    . dialysis solution 1.5% low-MG/low-CA    . piperacillin-tazobactam (ZOSYN)  IV Stopped (11/03/16 0154)  . vancomycin Stopped (10/30/16 1801)     LOS: 10 days    Time spent: 25 minutes    [place], DO [name] Pager [phone]  If 7PM-7AM, please contact night-coverage [email or web address] Password [email or web address] 11/03/2016, 8:56 AM

## 2016-11-03 NOTE — Progress Notes (Signed)
Patient states that he needs to see a psychiatrist. Also, patient is hallucinating and states that there is a man with a magnet that is pulling his toe. Patient seems very anxious. MD notified. Will continue to monitor.

## 2016-11-04 DIAGNOSIS — G9349 Other encephalopathy: Secondary | ICD-10-CM

## 2016-11-04 DIAGNOSIS — R451 Restlessness and agitation: Secondary | ICD-10-CM | POA: Diagnosis not present

## 2016-11-04 DIAGNOSIS — G9340 Encephalopathy, unspecified: Secondary | ICD-10-CM | POA: Diagnosis not present

## 2016-11-04 DIAGNOSIS — F17210 Nicotine dependence, cigarettes, uncomplicated: Secondary | ICD-10-CM

## 2016-11-04 DIAGNOSIS — L03115 Cellulitis of right lower limb: Secondary | ICD-10-CM

## 2016-11-04 DIAGNOSIS — F1290 Cannabis use, unspecified, uncomplicated: Secondary | ICD-10-CM

## 2016-11-04 LAB — HEPATIC FUNCTION PANEL
ALBUMIN: 2.2 g/dL — AB (ref 3.5–5.0)
ALK PHOS: 219 U/L — AB (ref 38–126)
ALT: 24 U/L (ref 17–63)
AST: 22 U/L (ref 15–41)
Bilirubin, Direct: <0.1 mg/dL — ABNORMAL LOW (ref 0.1–0.5)
TOTAL PROTEIN: 6 g/dL — AB (ref 6.5–8.1)
Total Bilirubin: 0.6 mg/dL (ref 0.3–1.2)

## 2016-11-04 LAB — RENAL FUNCTION PANEL
Albumin: 2.1 g/dL — ABNORMAL LOW (ref 3.5–5.0)
Anion gap: 17 — ABNORMAL HIGH (ref 5–15)
BUN: 45 mg/dL — ABNORMAL HIGH (ref 6–20)
CO2: 26 mmol/L (ref 22–32)
Calcium: 7.9 mg/dL — ABNORMAL LOW (ref 8.9–10.3)
Chloride: 98 mmol/L — ABNORMAL LOW (ref 101–111)
Creatinine, Ser: 12.54 mg/dL — ABNORMAL HIGH (ref 0.61–1.24)
GFR calc Af Amer: 5 mL/min — ABNORMAL LOW (ref >60)
GFR calc non Af Amer: 4 mL/min — ABNORMAL LOW (ref >60)
Glucose, Bld: 89 mg/dL (ref 65–99)
Phosphorus: 6.7 mg/dL — ABNORMAL HIGH (ref 2.5–4.6)
Potassium: 3.7 mmol/L (ref 3.5–5.1)
Sodium: 141 mmol/L (ref 135–145)

## 2016-11-04 LAB — VANCOMYCIN, RANDOM: VANCOMYCIN RM: 17

## 2016-11-04 LAB — VITAMIN B12: Vitamin B-12: 1080 pg/mL — ABNORMAL HIGH (ref 180–914)

## 2016-11-04 LAB — AMMONIA: AMMONIA: 21 umol/L (ref 9–35)

## 2016-11-04 LAB — TSH: TSH: 8.48 u[IU]/mL — AB (ref 0.350–4.500)

## 2016-11-04 MED ORDER — OXYCODONE HCL 5 MG PO TABS
ORAL_TABLET | ORAL | Status: AC
  Filled 2016-11-04: qty 2

## 2016-11-04 MED ORDER — LORAZEPAM 2 MG/ML IJ SOLN
1.0000 mg | Freq: Four times a day (QID) | INTRAMUSCULAR | Status: DC
  Administered 2016-11-04 – 2016-11-05 (×2): 1 mg via INTRAVENOUS
  Filled 2016-11-04 (×2): qty 1

## 2016-11-04 MED ORDER — LORAZEPAM 2 MG/ML IJ SOLN
2.0000 mg | Freq: Four times a day (QID) | INTRAMUSCULAR | Status: DC | PRN
  Administered 2016-11-04 – 2016-11-06 (×5): 2 mg via INTRAVENOUS
  Filled 2016-11-04 (×5): qty 1

## 2016-11-04 MED ORDER — VANCOMYCIN HCL 500 MG IV SOLR
500.0000 mg | Freq: Once | INTRAVENOUS | Status: AC
  Administered 2016-11-04: 500 mg via INTRAVENOUS
  Filled 2016-11-04: qty 500

## 2016-11-04 MED ORDER — LORAZEPAM 2 MG/ML IJ SOLN: 1.0000 mg | Freq: Four times a day (QID) | INTRAMUSCULAR | Status: DC

## 2016-11-04 MED ORDER — LORAZEPAM 2 MG/ML IJ SOLN
1.0000 mg | Freq: Once | INTRAMUSCULAR | Status: AC
  Administered 2016-11-04: 1 mg via INTRAVENOUS

## 2016-11-04 MED ORDER — OXYCODONE HCL 5 MG PO TABS: 5.0000 mg | ORAL_TABLET | ORAL | Status: DC | PRN

## 2016-11-04 MED ORDER — PIPERACILLIN-TAZOBACTAM IN DEX 2-0.25 GM/50ML IV SOLN
2.2500 g | Freq: Two times a day (BID) | INTRAVENOUS | Status: DC
  Administered 2016-11-04 – 2016-11-07 (×6): 2.25 g via INTRAVENOUS
  Filled 2016-11-04 (×8): qty 50

## 2016-11-04 MED ORDER — VALPROATE SODIUM 250 MG/5ML PO SOLN
500.0000 mg | Freq: Two times a day (BID) | ORAL | Status: DC
  Administered 2016-11-04 (×2): 500 mg via ORAL
  Filled 2016-11-04 (×4): qty 10

## 2016-11-04 MED ORDER — HYDROXYZINE HCL 50 MG/ML IM SOLN
25.0000 mg | Freq: Four times a day (QID) | INTRAMUSCULAR | Status: DC | PRN
  Filled 2016-11-04: qty 0.5

## 2016-11-04 MED ORDER — SODIUM CHLORIDE 0.9 % IV SOLN
500.0000 mg | INTRAVENOUS | Status: DC
  Administered 2016-11-09: 500 mg via INTRAVENOUS
  Filled 2016-11-04: qty 500

## 2016-11-04 NOTE — Consult Note (Signed)
NEURO HOSPITALIST CONSULT NOTE   Requestig physician: Dr. Grandville Silos   Reason for Consult:AMS with question seiuzre     HPI:                                                                                                                                          Hayden [name] is an 46 y.o. 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 Patient has been in the hospital and been being treated with IV antibiotics. I will last several days patient's electrolytes have been off. He is a renal patient, thus his creatinine is been elevated. Patient's potassium has been as low as 3.1, BUN/creatinine have been around 60/12, phosphorus is elevated as 8.7 with most recent being 6.7, WBC of 14.0.  At one point patient was hypertensive with a blood pressure as high as 244/110. Most recent blood pressures was 163/81.  Apparently last night patient was extremely agitated and trying to get out of bed. He was placed and wrist restraints along with a soft belt restrained and given Ativan. Apparently there was some blood noted on his tongue and as there is concern for possible seizure activity.  Currently patient is in 3 point restraints extremely agitated and restless. Patient is too agitated to follow commands or give good answers. There is some possibility that patient was on Xanax prior to admission however this is not confirmed at this point in time. It was asked to patient if he drank alcohol however no clear answer was obtained at this time. Patient does look slightly as though he is in withdraws along with as noted encephalopathy. Currently he is getting his peritoneal dialysis.  Past Medical History:  Diagnosis Date  . Anemia March 2014  . Cancer ([place])    Kidney  . CHF (congestive heart failure) ([place]) 3329   Acute systolic and diastolic CHF  . Depression    & rage --  was in counseling....great now  . Diabetes mellitus    NO DM  SINCE LOST 130LBS  . ESRD on peritoneal dialysis ([place]) 2018   Started in-center HD approx 2015 for 2 years, then did about 1 year of home HD and then started peritoneal dialysis in early 2018.    Marland Kitchen History of kidney cancer   . Hyperlipidemia   . Hypertension   . Obesity   . PONV (postoperative nausea and vomiting)   . Stroke [facility])     Past Surgical History:  Procedure Laterality Date  . ABDOMINAL AORTOGRAM W/LOWER EXTREMITY N/A 10/28/2016   Procedure: Abdominal Aortogram w/Lower Extremity;  Surgeon: Angelia Mould, MD;  Location: [place] [facility];  Service: Cardiovascular;  Laterality: N/A;  . AV FISTULA PLACEMENT Left 01/26/2013   Procedure: ARTERIOVENOUS (AV) FISTULA  CREATION - LEFT RADIAL CEPHALIC AVF;  Surgeon: Angelia Mould, MD;  Location: [place];  Service: Vascular;  Laterality: Left;  . CHOLECYSTECTOMY  11/06/2015   Procedure: LAPAROSCOPIC CHOLECYSTECTOMY;  Surgeon: Ralene Ok, MD;  Location: [place];  Service: General;;  . HERNIA REPAIR    . LOWER EXTREMITY ANGIOGRAM Right 10/30/2016   Procedure: Right  LOWER EXTREMITY ANGIOGRAM WITH RIGHT SUPERFICIAL FEMORAL ARTERY balloon angioplasty;  Surgeon: Conrad Brownwood, MD;  Location: [place];  Service: Vascular;  Laterality: Right;  . NEPHRECTOMY Right 2008   partial  . TESTICLE TORSION REDUCTION    . TONSILLECTOMY AND ADENOIDECTOMY      Family History  Problem Relation Age of Onset  . Heart disease Mother     Heart Disease before age 78  . Deep vein thrombosis Father   . Heart attack Father   . Other Other       Social History:  reports that he has been smoking Cigarettes.  He has a 2.50 pack-year smoking history. He has never used smokeless tobacco. He reports that he uses drugs, including Marijuana. He reports that he does not drink alcohol.  Allergies  Allergen Reactions  . Dilaudid [Hydromorphone Hcl] Other (See Comments)    ABNORMAL BEHAVIORS "VERBALLY AND PHYSICALLY ABUSIVE" PSYCHOSIS  . Morphine And  Related Itching and Other (See Comments)    SYNCOPE    MEDICATIONS:                                                                                                                     Prior to Admission:  Prescriptions Prior to Admission  Medication Sig Dispense Refill Last Dose  . ALPRAZolam (XANAX) 0.5 MG tablet Take 0.5-1 mg by mouth 2 (two) times daily as needed for anxiety.    10/23/2016 at Unknown time  . ALPRAZolam (XANAX) 1 MG tablet Take 1 mg by mouth at bedtime as needed for anxiety.   10/23/2016 at Unknown time  . amLODipine (NORVASC) 10 MG tablet Take 10 mg by mouth daily.   10/22/2016 at Unknown time  . aspirin EC 81 MG tablet Take 81 mg by mouth daily.   10/23/2016 at Unknown time  . buPROPion (WELLBUTRIN XL) 150 MG 24 hr tablet Take 150-300 mg by mouth daily. 150 mg in the morning, 300 mg at bedtime   10/23/2016 at Unknown time  . cinacalcet (SENSIPAR) 60 MG tablet Take 60 mg by mouth daily.   10/23/2016 at Unknown time  . clindamycin (CLEOCIN) 300 MG capsule Take 300 mg by mouth 4 (four) times daily.   10/23/2016 at 1630  . cloNIDine (CATAPRES - DOSED IN MG/24 HR) 0.2 mg/24hr patch Place 1 patch onto the skin once a week.   10/23/2016 at Unknown time  . hydrALAZINE (APRESOLINE) 100 MG tablet Take 100 mg by mouth 2 (two) times daily.   10/22/2016 at pm  . isosorbide dinitrate (ISORDIL) 10 MG tablet Take 10 mg by mouth daily.   10/23/2016 at Unknown time  .  labetalol (NORMODYNE) 100 MG tablet Take 150 mg by mouth 2 (two) times daily.    10/23/2016 at 8a  . omeprazole (PRILOSEC) 40 MG capsule Take 40 mg by mouth daily.   10/23/2016 at Unknown time  . PARoxetine (PAXIL) 20 MG tablet Take 40 mg by mouth daily.    10/23/2016 at Unknown time  . pravastatin (PRAVACHOL) 20 MG tablet Take 20 mg by mouth daily.   10/23/2016 at Unknown time  . Vitamin D, Ergocalciferol, (DRISDOL) 50000 units CAPS capsule Take 50,000 Units by mouth every 7 (seven) days. Every 7 days; on Thursday   Past Week at Unknown  time  . buPROPion (WELLBUTRIN SR) 150 MG 12 hr tablet Take 150-300 mg by mouth See admin instructions. 150 mg in the morning and 300 mg at bedtime   Not Taking at Unknown time  . calcium acetate (PHOSLO) 667 MG capsule Take 2 capsules (1,334 mg total) by mouth 3 (three) times daily with meals. (Patient not taking: Reported on 10/24/2016) 120 capsule 0 Not Taking at Unknown time  . furosemide (LASIX) 80 MG tablet Take 80 mg by mouth 2 (two) times daily.   Not Taking at Unknown time  . saccharomyces boulardii (FLORASTOR) 250 MG capsule Take 1 capsule (250 mg total) by mouth 2 (two) times daily. (Patient not taking: Reported on 10/24/2016) 30 capsule 0 Not Taking at Unknown time   Scheduled: . amLODipine  10 mg Oral Daily  . aspirin EC  81 mg Oral Daily  . buPROPion  150 mg Oral Daily   And  . buPROPion  300 mg Oral QHS  . calcitRIOL  0.5 mcg Oral Daily  . calcium acetate  1,334 mg Oral TID WC  . cinacalcet  60 mg Oral Q breakfast  . cloNIDine  0.2 mg Transdermal Weekly  . darbepoetin (ARANESP) injection - NON-DIALYSIS  100 mcg Subcutaneous Q Sun-1800  . feeding supplement (NEPRO CARB STEADY)  237 mL Oral BID BM  . feeding supplement (PRO-STAT SUGAR FREE 64)  30 mL Oral BID  . gentamicin cream  1 application Topical Daily  . heparin  5,000 Units Subcutaneous Q8H  . hydrALAZINE  100 mg Oral BID  . isosorbide dinitrate  5 mg Oral BID  . labetalol  200 mg Oral BID  . oxyCODONE  15 mg Oral Q12H  . pantoprazole  40 mg Oral Daily  . potassium chloride  30 mEq Oral Daily  . pravastatin  20 mg Oral Daily  . saccharomyces boulardii  250 mg Oral BID  . sodium chloride flush  3 mL Intravenous Q12H  . Vitamin D (Ergocalciferol)  50,000 Units Oral Q7 days     ROS:                                                                                                                                       History obtained from unobtainable from patient  due to mental status     Blood pressure (!) 163/81,  pulse 91, temperature 98.4 F (36.9 C), temperature source Oral, resp. rate 18, height 5\' 10"  (1.778 m), weight 84.4 kg (186 lb 1.1 oz), SpO2 97 %.   Neurologic Examination:                                                                                                      HEENT-  Normocephalic, no lesions, without obvious abnormality.  Normal external eye and conjunctiva.  Normal TM's bilaterally.  Normal auditory canals and external ears. Normal external nose, mucus membranes and septum.  Normal pharynx. Cardiovascular- S1, S2 normal, pulses palpable throughout   Lungs- chest clear, no wheezing, rales, normal symmetric air entry, Heart exam - S1, S2 normal, no murmur, no gallop, rate regular Abdomen- soft, non-tender; bowel sounds normal; no masses,  no organomegaly Extremities- Cellulitis and gangrene of right foot and great toe. Lymph-no adenopathy palpable Musculoskeletal-as noted under extremities Skin-As noted under extremities  Neurological Examination Mental Status: Patient is an 3. restraints, agitated, attempting to get out of the bed and follows no commands. He is very dysarthric and unable to understand. Cranial Nerves: II: Visual fields grossly normal, pupils equal, round, reactive to light and accommodation III,IV, VI: ptosis not present, extra-ocular motions intact bilaterally V,VII: Face symmetric, facial light touch sensation normal bilaterally VIII: hearing normal bilaterally IX,X: uvula rises symmetrically XI: bilateral shoulder shrug XII: midline tongue extension Motor: Right : Upper extremity   5/5    Left:     Upper extremity   5/5  Lower extremity   5/5     Lower extremity   5/5 Although he is in 3-point restraints he has very good strength in all 4 extremities. Sensory: Pinprick and light touch intact throughout, bilaterally Deep Tendon Reflexes: 2+ and symmetric throughout Plantars: Right:  Due to gangrene not tested  Left: downgoing Cerebellar: Unable to  test due to agitation Gait: Not tested      Lab Results: Basic Metabolic Panel:  Recent Labs Lab 10/31/16 0540 11/01/16 0425 11/02/16 0357 11/03/16 0412 11/04/16 0508  NA 136 135 138 138 141  K 3.9 3.7 3.1* 3.4* 3.7  CL 95* 93* 92* 94* 98*  CO2 24 20* 27 26 26   GLUCOSE 69 110* 120* 120* 89  BUN 61* 60* 48* 42* 45*  CREATININE 12.89* 12.80* 11.52* 11.41* 12.54*  CALCIUM 6.7* 7.1* 7.5* 7.8* 7.9*  MG 2.1 2.1  --   --   --   PHOS 9.4* 8.7* 8.0* 6.9* 6.7*    Liver Function Tests:  Recent Labs Lab 10/31/16 0540 11/01/16 0425 11/02/16 0357 11/03/16 0412 11/04/16 0508  ALBUMIN 1.7* 2.0* 2.0* 2.0* 2.1*   No results for input(s): LIPASE, AMYLASE in the last 168 hours. No results for input(s): AMMONIA in the last 168 hours.  CBC:  Recent Labs Lab 10/30/16 0614 10/31/16 0540 11/01/16 0425 11/03/16 0412  WBC 13.2* 11.1* 13.2* 14.0*  NEUTROABS  --  6.5 9.0*  --   HGB 10.4* 8.9* 9.5* 10.2*  HCT 33.0*  29.2* 30.3* 32.7*  MCV 79.1 79.3 79.5 79.4  PLT 588* 495* 520* 543*    Cardiac Enzymes: No results for input(s): CKTOTAL, CKMB, CKMBINDEX, TROPONINI in the last 168 hours.  Lipid Panel:  Recent Labs Lab 11/02/16 0357  CHOL 150  TRIG 74  HDL 72  CHOLHDL 2.1  VLDL 15  LDLCALC 63    CBG:  Recent Labs Lab 10/30/16 1050 11/03/16 2023  GLUCAP 78 84    Microbiology: Results for orders placed or performed during the hospital encounter of 10/23/16  Blood culture (routine x 2)     Status: None   Collection Time: 10/23/16  5:20 PM  Result Value Ref Range Status   Specimen Description BLOOD RIGHT ANTECUBITAL  Final   Special Requests   Final    BOTTLES DRAWN AEROBIC AND ANAEROBIC Blood Culture adequate volume   Culture NO GROWTH 5 DAYS  Final   Report Status 10/28/2016 FINAL  Final  Blood culture (routine x 2)     Status: None   Collection Time: 10/23/16  6:26 PM  Result Value Ref Range Status   Specimen Description BLOOD RIGHT ANTECUBITAL  Final    Special Requests   Final    BOTTLES DRAWN AEROBIC AND ANAEROBIC Blood Culture adequate volume   Culture NO GROWTH 5 DAYS  Final   Report Status 10/28/2016 FINAL  Final  MRSA PCR Screening     Status: None   Collection Time: 10/24/16  5:03 AM  Result Value Ref Range Status   MRSA by PCR NEGATIVE NEGATIVE Final    Comment:        The GeneXpert MRSA Assay (FDA approved for NASAL specimens only), is one component of a comprehensive MRSA colonization surveillance program. It is not intended to diagnose MRSA infection nor to guide or monitor treatment for MRSA infections.   Surgical pcr screen     Status: None   Collection Time: 10/30/16  6:21 AM  Result Value Ref Range Status   MRSA, PCR NEGATIVE NEGATIVE Final   Staphylococcus aureus NEGATIVE NEGATIVE Final    Comment:        The Xpert SA Assay (FDA approved for NASAL specimens in patients over 69 years of age), is one component of a comprehensive surveillance program.  Test performance has been validated by [facility] for patients greater than or equal to 58 year old. It is not intended to diagnose infection nor to guide or monitor treatment.     Coagulation Studies: No results for input(s): LABPROT, INR in the last 72 hours.  Imaging: No results found.     Assessment and plan per attending neurologist  Etta Quill PA-C [facility] [phone]  11/04/2016, 11:35 AM   Assessment/Plan: This is a 46 year old male with multiple medical issues. These include cellulitis and gangrene of right toe, hypertensive urgency, agitation, infection, cellulitis, end-stage renal disease currently on peritoneal dialysis to which he missed a few doses. On exam patient is agitated and encephalopathic. It is unclear if patient had seizure last night however does appear he has bitten his time. For this reason we will obtain an EEG however I fear there may be a lot of artifact as patient does not stay still. Would also like  to obtain MRI brain without contrast when patient is able to remain still. As always would recommend treating patient's underlying infectious and metabolic etiologies that can cause encephalopathy.  I agree with a psychiatry/psychologist consult as patient is on Wellbutrin, Wellbutrin is known to lower  the seizure threshold and if possible DC Wellbutrin or lower the dose however he is on such a high dose and do not want him to go into withdrawal. I will leave this up to psych.    Dr. Mitchel Honour to attest note
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
## 2016-11-04 NOTE — Progress Notes (Signed)
   Daily Progress Note   Cardiology w/u complete: intermediate risk with mild Pulm HTN,  Mild AS and mild MS.  Suspected CAD  After reviewing his angiographic studies, I would offer him: right calf exploration: right common femoral artery to popliteal artery vs peroneal artery bypass.   The risk, benefits, and alternative for bypass operations were discussed with the patient.    The patient is aware the risks include but are not limited to: bleeding, infection, myocardial infarction, stroke, limb loss, nerve damage, need for additional procedures in the future, wound complications, and inability to complete the bypass.   I have repeatedly discussed with the patient and wife the real possibility of of inability to complete the bypass due to significant tibial calcification.  The patient is aware of these risks and agreed to proceed.    Adele Barthel, MD, FACS [facility] Office: [phone] Pager: [phone]  11/04/2016, 7:16 AM

## 2016-11-04 NOTE — Progress Notes (Signed)
Pt confused; agitated and combative. PD tx completed without complications; UF [id number].

## 2016-11-04 NOTE — Progress Notes (Addendum)
Pharmacy Antibiotic Note  Hayden [name] is a 46 y.o. male admitted on 10/23/2016 with right foot wound/cellulitis that failed to respond to out dose of Dalvance 1500mg  IV on 10/10/16 and PO clindamycin. History of ESRD on PD at home. Pharmacy consulted to dose Zosyn and vancomycin.   The patient's Vancomycin random level this morning was 17 mcg/ml and therapeutic. Will plan to re-dose Vancomycin since the level is <20 mcg/ml. It is estimated that the patient will likely need another dose on 5/12 AM however will plan to check a Vancomycin random level prior to the dose to ensure levels are adequate for re-dosing.   Vancomycin Doses and Levels: * Doses: 2g IV x1 on 4/25, 500mg  on 5/2, 500 mg ordered on 5/7 * Levels: VR 28 mcg/ml (4/28 @ 0330), 26 mcg/ml (4/29 @ 0500), 22 mcg/ml (5/1 @ 0530), 17 mcg/ml (5/7 @ 0900)  Plan: 1. Vancomycin 500 mg IV x 1 dose today 2. Vancomycin 500 mg IV every 5 days starting on 5/12 3. Adjust Zosyn to 2.25g IV every 12 hours 4. Will continue to follow HD schedule/duration, culture results, LOT, and antibiotic de-escalation plans   Height: 5\' 10"  (177.8 cm) Weight: 186 lb 1.1 oz (84.4 kg) IBW/kg (Calculated) : 73  Temp (24hrs), Avg:98.5 F (36.9 C), Min:98.3 F (36.8 C), Max:98.7 F (37.1 C)   Recent Labs Lab 10/29/16 0524 10/30/16 0614 10/31/16 0540 11/01/16 0425 11/02/16 0357 11/03/16 0412 11/04/16 0508 11/04/16 0906  WBC  --  13.2* 11.1* 13.2*  --  14.0*  --   --   CREATININE 11.46* 11.77* 12.89* 12.80* 11.52* 11.41* 12.54*  --   VANCORANDOM 22  --   --   --   --   --   --  17    Estimated Creatinine Clearance: 7.8 mL/min (A) (by C-G formula based on SCr of 12.54 mg/dL (H)).    Allergies  Allergen Reactions  . Dilaudid [Hydromorphone Hcl] Other (See Comments)    ABNORMAL BEHAVIORS "VERBALLY AND PHYSICALLY ABUSIVE" PSYCHOSIS  . Morphine And Related Itching and Other (See Comments)    SYNCOPE    Dalvance 1500mg  IV once 10/10/16 at [facility]  outpt infusion center Zosyn 4/25 >> Vancomycin 4/25 >>  * Doses: 2g IV x1 on 4/25, 500mg  on 5/2, 500 mg ordered on 5/7 * Levels: VR 28 mcg/ml (4/28 @ 0330), 26 mcg/ml (4/29 @ 0500), 22 mcg/ml (5/1 @ 0530), 17 mcg/ml (5/7 @ 0900)  4/25 BCx: neg 4/26 MRSA PCR neg  Thank you for allowing pharmacy to be a part of this patient's care.  Alycia Rossetti, PharmD, BCPS Clinical Pharmacist Pager: [phone] Clinical phone for 11/04/2016 from 7a-3:30p: [phone] If after 3:30p, please call main pharmacy at: [phone] 11/04/2016 1:31 PM

## 2016-11-04 NOTE — Progress Notes (Signed)
[facility] Progress Note   Subjective:  Confused/combative last night; PD unable to be started until 2am. Currently resting in room, restless/myoclonic jerking present. Woken easily. Denies CP or dyspnea. Looks like extensive dried blood in his mouth (?did he bite his tongue), RN informed.  Objective Vitals:   11/03/16 1811 11/03/16 2047 11/03/16 2149 11/04/16 0200  BP: (!) 152/85 (!) 154/75 (!) 157/96 (!) 163/81  Pulse: 87 90 92 91  Resp: 17   18  Temp: 98.3 F (36.8 C) 98.7 F (37.1 C)  98.4 F (36.9 C)  TempSrc: Oral Oral  Oral  SpO2: 94% 97%    Weight:    84.4 kg (186 lb 1.1 oz)  Height:       Physical Exam General: Lethargic, confused. NAD. Myoclonic jerking present. Heart: RRR; no murmur Lungs: CTAB Abdomen: soft, non-tender. Extremities: No LE edema. Necrotic R toe wound. Dialysis Access: PD cath and L forearm AVF + thrill.  Additional Objective Labs: Basic Metabolic Panel:  Recent Labs Lab 11/02/16 0357 11/03/16 0412 11/04/16 0508  NA 138 138 141  K 3.1* 3.4* 3.7  CL 92* 94* 98*  CO2 27 26 26   GLUCOSE 120* 120* 89  BUN 48* 42* 45*  CREATININE 11.52* 11.41* 12.54*  CALCIUM 7.5* 7.8* 7.9*  PHOS 8.0* 6.9* 6.7*   Liver Function Tests:  Recent Labs Lab 11/02/16 0357 11/03/16 0412 11/04/16 0508  ALBUMIN 2.0* 2.0* 2.1*   CBC:  Recent Labs Lab 10/30/16 0614 10/31/16 0540 11/01/16 0425 11/03/16 0412  WBC 13.2* 11.1* 13.2* 14.0*  NEUTROABS  --  6.5 9.0*  --   HGB 10.4* 8.9* 9.5* 10.2*  HCT 33.0* 29.2* 30.3* 32.7*  MCV 79.1 79.3 79.5 79.4  PLT 588* 495* 520* 543*   CBG:  Recent Labs Lab 10/30/16 1050 11/03/16 2023  GLUCAP 78 84   Medications: . sodium chloride    . dialysis solution 1.5% low-MG/low-CA    . piperacillin-tazobactam (ZOSYN)  IV 3.375 g (11/04/16 0836)  . vancomycin Stopped (10/30/16 1801)   . amLODipine  10 mg Oral Daily  . aspirin EC  81 mg Oral Daily  . buPROPion  150 mg Oral Daily   And  .  buPROPion  300 mg Oral QHS  . calcitRIOL  0.5 mcg Oral Daily  . calcium acetate  1,334 mg Oral TID WC  . cinacalcet  60 mg Oral Q breakfast  . cloNIDine  0.2 mg Transdermal Weekly  . darbepoetin (ARANESP) injection - NON-DIALYSIS  100 mcg Subcutaneous Q Sun-1800  . feeding supplement (NEPRO CARB STEADY)  237 mL Oral BID BM  . feeding supplement (PRO-STAT SUGAR FREE 64)  30 mL Oral BID  . gentamicin cream  1 application Topical Daily  . heparin  5,000 Units Subcutaneous Q8H  . hydrALAZINE  100 mg Oral BID  . isosorbide dinitrate  5 mg Oral BID  . labetalol  200 mg Oral BID  . oxyCODONE  15 mg Oral Q12H  . pantoprazole  40 mg Oral Daily  . potassium chloride  30 mEq Oral Daily  . pravastatin  20 mg Oral Daily  . saccharomyces boulardii  250 mg Oral BID  . sodium chloride flush  3 mL Intravenous Q12H  . Vitamin D (Ergocalciferol)  50,000 Units Oral Q7 days    Dialysis Orders: [place] [facility].  CCPD: 3L fill 5 exchanges at night with 5th fill manually drained 1-1/2 hours later. Uses mostly 1's and 2's at home.   Assessment/Plan: 1.  Ischemic/gangrenous R great toe/Severe PAD: ToOR tomorrow, hopefully for bypass but amputation is possibility. WBC rising. Continue Vanc/Zosyn (next Vanc level ordered for 5/8).  2. ESRD: Continue CPPD (started late last night). Labs ok. If refuses PD again, may need to consider short-term HD. 3. Anemia: Hgb 10.2, continue Aranesp weekly. 4. Secondary hyperparathyroidism: Phos high, but coming down. Continue binders/VDRA/sensipar. 5. HTN/volume: BP high, does not seem to have volume excess. Continue PD with all 1.5%. Follow BP. 6. Nutrition: Alb 2.1. Continue protein supps. 7. Abnormal myoview stress test: EF 43%. Reversible, medium sized, mild basal to mid inferior and basal inferolateral perfusion defect concerning for ischemia. Per cards, nuc medicine study is inaccurate, no plans for further investigation.  8. Confusion: With myoclonic jerking. Related  to ?infection, pain meds, BZs/SNRI, versus uremia. Big change from yesterday. Would recommend evaluation with head CT and possible neuro consult.  Veneta Penton, PA-C 11/04/2016, 10:00 AM  [facility] Pager: [phone]

## 2016-11-04 NOTE — Progress Notes (Signed)
Patient on HD and agitated, EEG will be attempted 11/05/16 as schedule permits

## 2016-11-04 NOTE — Progress Notes (Addendum)
   Daily Progress Note   Assessment/Planning:   R great toe gangrene, BLE PAD, AMS   Hold all narcotics  Agree Neuro c/s and Head CT would be part of the AMS work-up  Plan: right calf exploration: right common femoral artery to popliteal artery vs peroneal artery bypass, R great toe amputation TOMORROW.  The risk, benefits, and alternative for bypass operations will be discussed with the patient's wife in detail.     I have previously discussed the plan with her and the patient.    I have repeatedly discussed with the patient and wife the real possibility of inability to complete the bypass due to significant popliteal and tibial artery calcification.  Pt is tentatively on the schedule for tomorrow depending on resolution of his AMS and obtain consent.  I have called the listed cellular number twice without successfully contacting the patient's wife.   Subjective  - 5 Days Post-Op   Confused, drowsy   Objective   Vitals:   11/03/16 1811 11/03/16 2047 11/03/16 2149 11/04/16 0200  BP: (!) 152/85 (!) 154/75 (!) 157/96 (!) 163/81  Pulse: 87 90 92 91  Resp: 17   18  Temp: 98.3 F (36.8 C) 98.7 F (37.1 C)  98.4 F (36.9 C)  TempSrc: Oral Oral  Oral  SpO2: 94% 97%    Weight:    186 lb 1.1 oz (84.4 kg)  Height:         Intake/Output Summary (Last 24 hours) at 11/04/16 1145 Last data filed at 11/04/16 0600  Gross per 24 hour  Intake              380 ml  Output                0 ml  Net              380 ml    VASC R great toe dry gangrene, no palpable pulse in R foot, B groin without hematoma  NEURO Confused, drowsy    Laboratory CBC CBC Latest Ref Rng & Units 11/03/2016 11/01/2016 10/31/2016  WBC 4.0 - 10.5 K/uL 14.0(H) 13.2(H) 11.1(H)  Hemoglobin 13.0 - 17.0 g/dL 10.2(L) 9.5(L) 8.9(L)  Hematocrit 39.0 - 52.0 % 32.7(L) 30.3(L) 29.2(L)  Platelets 150 - 400 K/uL 543(H) 520(H) 495(H)     BMET    Component Value Date/Time   NA 141 11/04/2016 0508   K 3.7  11/04/2016 0508   CL 98 (L) 11/04/2016 0508   CO2 26 11/04/2016 0508   GLUCOSE 89 11/04/2016 0508   BUN 45 (H) 11/04/2016 0508   CREATININE 12.54 (H) 11/04/2016 0508   CALCIUM 7.9 (L) 11/04/2016 0508   GFRNONAA 4 (L) 11/04/2016 0508   GFRAA 5 (L) 11/04/2016 0508    Adele Barthel, MD, FACS [facility] Office: [phone] Pager: [phone]  11/04/2016, 11:45 AM   Addendum  Attempted to contact pt's wife again.   Adele Barthel, MD, FACS [facility] [place] Office: [phone] Pager: [phone]  11/04/2016, 2:54 PM

## 2016-11-04 NOTE — Progress Notes (Signed)
Patient consistently trying to get out of bed. RN administered PRN dose of Ativan with no improvement. HD RN to perform PD on patient but patient becomes agitated and restless not allowing PD to get started. RN notified on call NP, Schorr.  Orders for an additional dose of Ativan to be administered, as well as wrists and soft belt restraint. RN will implement and continue to monitor patient.  Ermalinda Memos, RN

## 2016-11-04 NOTE — Procedures (Signed)
Patient seen and examined on PD 1.5% Dianeal. 5 exchanges 1.5 hr dwell time 3L fill vol. Very agitated.  In restraints.   Treatment adjusted as needed.  Madelon Lips MD [facility] 2:04 PM

## 2016-11-04 NOTE — Progress Notes (Addendum)
PROGRESS NOTE    Hayden [name]  LZJ:[id number] DOB: 1970-07-25 DOA: 10/23/2016 PCP: Corliss Parish, MD    Brief Narrative:  46 y.o.malewith medical history significant of ESRD on home peritoneal dialysis, HTN, CVA; who presents with complaints of right foot redness and swelling. The initial injury happened over one month ago when his foot caught in the track of the back seat of his sisters mini [name]. He initially triedto keep the area clean and washed. However, develop redness, swelling and pain. Approximately 2 weeks ago he was seen at the emergency department and was given 1 dose of vancomycin,but ended up leaving AMA. Patient saw his PCP who prescribed clindamycin, patient took for last 12-13 days however states that the pain continued to get worse and with the drainage from the wound.    Assessment & Plan:   Principal Problem:   Cellulitis of great toe of right foot Active Problems:   Hypertensive urgency, malignant   End stage renal disease on dialysis (HCC)   Anxiety and depression   Leukocytosis   Hyperkalemia   Pressure injury of skin   Gangrene of right foot (HCC)   QT prolongation   Hypokalemia   Agitation  Cellulitis/dry gangrene of the right foot/Right great toe:  - failed treatment with outpatient antibiotics of clindamycin - Continue empiric antibiotics of vancomycin and Zosyn - Vascular surgery consulted, Arteriogram showed R SFA occlusion, unfortunately was not able to remain still for intervention. Per vascular surgery, may need to consider right BKA versus AKA if endovascular procedure to right SFA or possible bypass grafting not successful  - Patient status post anterograde right, femoral artery cannulation under ultrasound guidance, right leg runoff, angioplasty right superficial femoral artery per Dr. Bridgett Larsson 10/30/2016. -Appreciate Dr. Jess Barters evaluation -Continue current pain control management. Will place on OxyContin 15 mg twice daily in addition to  oxycodone for breakthrough pain. - s/p evaluation with Myoview stress test done 10/31/2016. Cardiology saw and recommends no further intervention prior to surgery. - Vascular surgery following and will reassess the patient on Monday(11/04/2016) to discuss surgical options.  Acute encephalopathy/agitation/combativeness Patient is notedto be agitated with combativeness overnight and currently in restraints. Patient also somewhat confused. Patient also noted to have blood around lips however refusing to open mouth to be examined. Increase Ativan to 2 mg IV every 6 hours when necessary agitation. Continue soft restraints. Patient's antidepressant medications have been discontinued secondary to QTc prolongation. Unable to use Haldol secondary to QTc prolongation. Check CT head, EEG. Consult with neurology and psychiatry for further evaluation and management.   Hypertensive urgency: Blood pressures noted to be as high as 240/110 on admission. Patient noted not recently taking home blood pressure medications  - Blood pressure improved on current regimen of Norvasc, clonidine patch, hydralazine, labetalol, Isordil.  Leukocytosis: WBC elevated at 14.6. WBC fluctuating. Suspect secondary to above. - continue IV antibiotics per pharmacy  ESRD on peritoneal dialysis: Patient receives nightly peritoneal pulses dialysis. Initial BUN 102, creatinine 13.79. Patient noted to refuse peritoneal dialysis the night of 10/30/2016 and 11/04/2015 due to agitation and combativeness. - Nephrology consulted for peritoneal dialysis - Continue Sensipar, calcitriol, andPhosLo - Per nephrology.  Hyperkalemia:Acute. Initial potassium 5.4 on admission. - Resolved with peritoneal dialysis.  Elevated anion gap likely secondary to uremia.   Elevatedalkaline phosphatase - Improving  Anxiety and depression - Anxiety improving, continue increased Xanax  dose. Patient's Paxil and Lexapro have been discontinued secondary  to QTc prolongation.  Prolonged QTC - Avoid QTC prolonging  medications, discontinued Lexapro. Repeat EKG still with QTC prolongation. Magnesium level at 2.1. Discontinued patient's albuterol nebs, Zofran, Paxil. Repeat EKG still with QTC prolongation. Monitor. Cardiology following.  -Preoperative cardiac evaluation Cardiology was consulted for preop evaluation. 2-D echo with EF of 60-65% with no wall motion abnormalities. Patient with Myoview stress test(10/31/2016) that shows intermediate risk with a EF of 43%, inferior hypokinesis, reversible medium-sized, mild basilar to mid inferior and basilar inferolateral perfusion defect concerning for possible ischemia. Cardiology following and feels EF on nuclear studies and accurate and that echo done was a good quality study with normal wall motion abnormalities.Per cardiology no preoperative coronary angiography is indicated at this time.    DVT prophylaxis: Heparin Code Status: Full Family Communication: Updated patient. No family at bedside. Disposition Plan: Per vascular surgery.   Consultants:   Cardiology : Dr.Croitoru 10/30/2016  Orthopedics: Dr. Sharol Given 10/28/2016  Vascular surgery: Dr. Bridgett Larsson 10/24/2016  Nephrology: Dr. Jeani Hawking 10/23/2016  Psychiatry pending  Procedures:   Antegrade right common femoral artery cannulation under ultrasound guidance, right leg runoff, angioplasty right superficial femoral artery per Dr. Bridgett Larsson 10/30/2016  Plain xray right foot 10/23/2016  Lower extremity vein mapping 10/28/2016  Lower extremity arterial duplex 10/25/2016  ABIs 10/25/2016--attempted  Abdominal aortogram with lower extremity 10/28/2016 Dr. Doren Custard  Myoview stress test 10/31/2016  2-D echo 11/01/2016  Antimicrobials:   Gentamicin cream 10/23/2016  IV Zosyn 10/23/2016  IV vancomycin 10/23/2016   Subjective: Patient patient noted to be agitated combative overnight and currently in restraints. Dried blood noted on lips and mouth.  Patient refused peritoneal dialysis overnight.  Objective: Vitals:   11/03/16 1811 11/03/16 2047 11/03/16 2149 11/04/16 0200  BP: (!) 152/85 (!) 154/75 (!) 157/96 (!) 163/81  Pulse: 87 90 92 91  Resp: 17   18  Temp: 98.3 F (36.8 C) 98.7 F (37.1 C)  98.4 F (36.9 C)  TempSrc: Oral Oral  Oral  SpO2: 94% 97%    Weight:    84.4 kg (186 lb 1.1 oz)  Height:        Intake/Output Summary (Last 24 hours) at 11/04/16 1101 Last data filed at 11/04/16 0600  Gross per 24 hour  Intake              380 ml  Output                0 ml  Net              380 ml   Filed Weights   11/02/16 0800 11/02/16 2133 11/04/16 0200  Weight: 83.8 kg (184 lb 11.9 oz) 89.1 kg (196 lb 6.9 oz) 84.4 kg (186 lb 1.1 oz)    Examination:  General exam: AGITATED, COMBATIVE, CONFUSED, IN 4 POINT RESTRAINTS.dried blood noted around lips and mouth. Respiratory system: No increased work of breathing, clear, no wheezing Cardiovascular system: rrr, no edema in LE Gastrointestinal system: Abdomen is nondistended, soft and nontender. No organomegaly or masses felt. Normal bowel sounds heard. Central nervous system: Alert and oriented. No focal neurological deficits. Extremities: right big toe with erythema surrounding dry gangrene-- foul odor not noticed Skin: No rashes, lesions or ulcers Psychiatry: anxious    Data Reviewed: I have personally reviewed following labs and imaging studies  CBC:  Recent Labs Lab 10/30/16 0614 10/31/16 0540 11/01/16 0425 11/03/16 0412  WBC 13.2* 11.1* 13.2* 14.0*  NEUTROABS  --  6.5 9.0*  --   HGB 10.4* 8.9* 9.5* 10.2*  HCT 33.0* 29.2* 30.3* 32.7*  MCV 79.1 79.3 79.5 79.4  PLT 588* 495* 520* 160*   Basic Metabolic Panel:  Recent Labs Lab 10/31/16 0540 11/01/16 0425 11/02/16 0357 11/03/16 0412 11/04/16 0508  NA 136 135 138 138 141  K 3.9 3.7 3.1* 3.4* 3.7  CL 95* 93* 92* 94* 98*  CO2 24 20* 27 26 26   GLUCOSE 69 110* 120* 120* 89  BUN 61* 60* 48* 42* 45*    CREATININE 12.89* 12.80* 11.52* 11.41* 12.54*  CALCIUM 6.7* 7.1* 7.5* 7.8* 7.9*  MG 2.1 2.1  --   --   --   PHOS 9.4* 8.7* 8.0* 6.9* 6.7*   GFR: Estimated Creatinine Clearance: 7.8 mL/min (A) (by C-G formula based on SCr of 12.54 mg/dL (H)). Liver Function Tests:  Recent Labs Lab 10/31/16 0540 11/01/16 0425 11/02/16 0357 11/03/16 0412 11/04/16 0508  ALBUMIN 1.7* 2.0* 2.0* 2.0* 2.1*   No results for input(s): LIPASE, AMYLASE in the last 168 hours. No results for input(s): AMMONIA in the last 168 hours. Coagulation Profile:  Recent Labs Lab 10/30/16 0614  INR 1.24   Cardiac Enzymes: No results for input(s): CKTOTAL, CKMB, CKMBINDEX, TROPONINI in the last 168 hours. BNP (last 3 results) No results for input(s): PROBNP in the last 8760 hours. HbA1C: No results for input(s): HGBA1C in the last 72 hours. CBG:  Recent Labs Lab 10/30/16 1050 11/03/16 2023  GLUCAP 78 84   Lipid Profile:  Recent Labs  11/02/16 0357  CHOL 150  HDL 72  LDLCALC 63  TRIG 74  CHOLHDL 2.1   Thyroid Function Tests: No results for input(s): TSH, T4TOTAL, FREET4, T3FREE, THYROIDAB in the last 72 hours. Anemia Panel: No results for input(s): VITAMINB12, FOLATE, FERRITIN, TIBC, IRON, RETICCTPCT in the last 72 hours. Sepsis Labs: No results for input(s): PROCALCITON, LATICACIDVEN in the last 168 hours.  Recent Results (from the past 240 hour(s))  Surgical pcr screen     Status: None   Collection Time: 10/30/16  6:21 AM  Result Value Ref Range Status   MRSA, PCR NEGATIVE NEGATIVE Final   Staphylococcus aureus NEGATIVE NEGATIVE Final    Comment:        The Xpert SA Assay (FDA approved for NASAL specimens in patients over 74 years of age), is one component of a comprehensive surveillance program.  Test performance has been validated by [facility] for patients greater than or equal to 36 year old. It is not intended to diagnose infection nor to guide or monitor treatment.           Radiology Studies: No results found.      Scheduled Meds: . amLODipine  10 mg Oral Daily  . aspirin EC  81 mg Oral Daily  . buPROPion  150 mg Oral Daily   And  . buPROPion  300 mg Oral QHS  . calcitRIOL  0.5 mcg Oral Daily  . calcium acetate  1,334 mg Oral TID WC  . cinacalcet  60 mg Oral Q breakfast  . cloNIDine  0.2 mg Transdermal Weekly  . darbepoetin (ARANESP) injection - NON-DIALYSIS  100 mcg Subcutaneous Q Sun-1800  . feeding supplement (NEPRO CARB STEADY)  237 mL Oral BID BM  . feeding supplement (PRO-STAT SUGAR FREE 64)  30 mL Oral BID  . gentamicin cream  1 application Topical Daily  . heparin  5,000 Units Subcutaneous Q8H  . hydrALAZINE  100 mg Oral BID  . isosorbide dinitrate  5 mg Oral BID  . labetalol  200 mg Oral BID  .  oxyCODONE  15 mg Oral Q12H  . pantoprazole  40 mg Oral Daily  . potassium chloride  30 mEq Oral Daily  . pravastatin  20 mg Oral Daily  . saccharomyces boulardii  250 mg Oral BID  . sodium chloride flush  3 mL Intravenous Q12H  . Vitamin D (Ergocalciferol)  50,000 Units Oral Q7 days   Continuous Infusions: . sodium chloride    . dialysis solution 1.5% low-MG/low-CA    . piperacillin-tazobactam (ZOSYN)  IV 3.375 g (11/04/16 0836)  . vancomycin Stopped (10/30/16 1801)     LOS: 11 days    Time spent: 46 minutes    Jules Vidovich, MD [name] Pager [phone]  If 7PM-7AM, please contact night-coverage [email or web address] Password [email or web address] 11/04/2016, 11:01 AM

## 2016-11-04 NOTE — Progress Notes (Signed)
Attempted to contact spouse for consent (x2). Left voicemail. Waiting on return call for consent. Patient is disoriented and unable to sign at this time

## 2016-11-05 ENCOUNTER — Inpatient Hospital Stay (HOSPITAL_COMMUNITY): Payer: Medicare Other | Admitting: Anesthesiology

## 2016-11-05 ENCOUNTER — Inpatient Hospital Stay (HOSPITAL_COMMUNITY): Payer: Medicare Other

## 2016-11-05 ENCOUNTER — Encounter (HOSPITAL_COMMUNITY): Payer: Self-pay | Admitting: Anesthesiology

## 2016-11-05 ENCOUNTER — Encounter (HOSPITAL_COMMUNITY)
Admission: EM | Disposition: A | Discharge status: Rehabilitation Facility | Payer: Self-pay | Source: Home / Self Care | Attending: Internal Medicine

## 2016-11-05 DIAGNOSIS — R9431 Abnormal electrocardiogram [ECG] [EKG]: Secondary | ICD-10-CM

## 2016-11-05 DIAGNOSIS — E876 Hypokalemia: Secondary | ICD-10-CM

## 2016-11-05 DIAGNOSIS — G9340 Encephalopathy, unspecified: Secondary | ICD-10-CM

## 2016-11-05 HISTORY — PX: FEMORAL-POPLITEAL BYPASS GRAFT: SHX937

## 2016-11-05 HISTORY — PX: AMPUTATION: SHX166

## 2016-11-05 HISTORY — PX: VEIN HARVEST: SHX6363

## 2016-11-05 LAB — RENAL FUNCTION PANEL
Albumin: 2 g/dL — ABNORMAL LOW (ref 3.5–5.0)
Anion gap: 17 — ABNORMAL HIGH (ref 5–15)
BUN: 34 mg/dL — ABNORMAL HIGH (ref 6–20)
CO2: 25 mmol/L (ref 22–32)
Calcium: 7.9 mg/dL — ABNORMAL LOW (ref 8.9–10.3)
Chloride: 98 mmol/L — ABNORMAL LOW (ref 101–111)
Creatinine, Ser: 11.36 mg/dL — ABNORMAL HIGH (ref 0.61–1.24)
GFR calc Af Amer: 6 mL/min — ABNORMAL LOW (ref >60)
GFR calc non Af Amer: 5 mL/min — ABNORMAL LOW (ref >60)
Glucose, Bld: 107 mg/dL — ABNORMAL HIGH (ref 65–99)
Phosphorus: 6.6 mg/dL — ABNORMAL HIGH (ref 2.5–4.6)
Potassium: 3.3 mmol/L — ABNORMAL LOW (ref 3.5–5.1)
Sodium: 140 mmol/L (ref 135–145)

## 2016-11-05 LAB — BLOOD GAS, ARTERIAL
Acid-base deficit: 0.7 mmol/L (ref 0.0–2.0)
BICARBONATE: 22.7 mmol/L (ref 20.0–28.0)
Drawn by: 505221
FIO2: 40
MECHVT: 580 mL
O2 SAT: 98.7 %
PCO2 ART: 32.6 mmHg (ref 32.0–48.0)
PEEP/CPAP: 5 cmH2O
PH ART: 7.457 — AB (ref 7.350–7.450)
Patient temperature: 98.6
RATE: 16 resp/min
pO2, Arterial: 143 mmHg — ABNORMAL HIGH (ref 83.0–108.0)

## 2016-11-05 LAB — GLUCOSE, CAPILLARY
GLUCOSE-CAPILLARY: 84 mg/dL (ref 65–99)
GLUCOSE-CAPILLARY: 148 mg/dL — AB (ref 65–99)
Glucose-Capillary: 240 mg/dL — ABNORMAL HIGH (ref 65–99)

## 2016-11-05 LAB — CBC
HEMATOCRIT: 34.1 % — AB (ref 39.0–52.0)
HEMOGLOBIN: 10.7 g/dL — AB (ref 13.0–17.0)
MCH: 25.2 pg — ABNORMAL LOW (ref 26.0–34.0)
MCHC: 31.4 g/dL (ref 30.0–36.0)
MCV: 80.2 fL (ref 78.0–100.0)
Platelets: 535 10*3/uL — ABNORMAL HIGH (ref 150–400)
RBC: 4.25 MIL/uL (ref 4.22–5.81)
RDW: 19.7 % — ABNORMAL HIGH (ref 11.5–15.5)
WBC: 15.5 10*3/uL — AB (ref 4.0–10.5)

## 2016-11-05 LAB — MAGNESIUM
MAGNESIUM: 1.9 mg/dL (ref 1.7–2.4)
Magnesium: 2 mg/dL (ref 1.7–2.4)

## 2016-11-05 LAB — TROPONIN I: TROPONIN I: 0.08 ng/mL — AB (ref <0.03)

## 2016-11-05 LAB — PHOSPHORUS
Phosphorus: 7.6 mg/dL — ABNORMAL HIGH (ref 2.5–4.6)
Phosphorus: 7 mg/dL — ABNORMAL HIGH (ref 2.5–4.6)

## 2016-11-05 LAB — T4, FREE: Free T4: 0.9 ng/dL (ref 0.61–1.12)

## 2016-11-05 SURGERY — BYPASS GRAFT FEMORAL-POPLITEAL ARTERY
Anesthesia: General | Site: Foot | Laterality: Right

## 2016-11-05 MED ORDER — SODIUM CHLORIDE 0.9 % IV SOLN: 500.0000 mL | Freq: Once | INTRAVENOUS | Status: DC | PRN

## 2016-11-05 MED ORDER — PROPOFOL 10 MG/ML IV BOLUS
INTRAVENOUS | Status: AC
  Filled 2016-11-05: qty 20

## 2016-11-05 MED ORDER — FLORANEX PO PACK
1.0000 g | PACK | Freq: Three times a day (TID) | ORAL | Status: DC
  Administered 2016-11-05 – 2016-11-06 (×2): 1 g
  Filled 2016-11-05 (×6): qty 1

## 2016-11-05 MED ORDER — LORAZEPAM 2 MG/ML IJ SOLN
4.0000 mg | Freq: Once | INTRAMUSCULAR | Status: AC
  Administered 2016-11-05: 4 mg via INTRAVENOUS
  Filled 2016-11-05: qty 2

## 2016-11-05 MED ORDER — PRAVASTATIN SODIUM 20 MG PO TABS
20.0000 mg | ORAL_TABLET | Freq: Every day | ORAL | Status: DC
  Administered 2016-11-06 – 2016-11-09 (×4): 20 mg
  Filled 2016-11-05 (×4): qty 1

## 2016-11-05 MED ORDER — ORAL CARE MOUTH RINSE
15.0000 mL | Freq: Four times a day (QID) | OROMUCOSAL | Status: DC
  Administered 2016-11-05 – 2016-11-09 (×9): 15 mL via OROMUCOSAL

## 2016-11-05 MED ORDER — ISOSORBIDE DINITRATE 10 MG PO TABS
5.0000 mg | ORAL_TABLET | Freq: Two times a day (BID) | ORAL | Status: DC
  Administered 2016-11-05 – 2016-11-07 (×4): 5 mg
  Filled 2016-11-05 (×4): qty 1

## 2016-11-05 MED ORDER — LIDOCAINE HCL (CARDIAC) 20 MG/ML IV SOLN
INTRAVENOUS | Status: DC | PRN
  Administered 2016-11-05: 100 mg via INTRAVENOUS

## 2016-11-05 MED ORDER — LIDOCAINE 2% (20 MG/ML) 5 ML SYRINGE
INTRAMUSCULAR | Status: AC
  Filled 2016-11-05: qty 5

## 2016-11-05 MED ORDER — ASPIRIN 81 MG PO CHEW
81.0000 mg | CHEWABLE_TABLET | Freq: Every day | ORAL | Status: DC
  Administered 2016-11-05 – 2016-11-09 (×5): 81 mg
  Filled 2016-11-05 (×5): qty 1

## 2016-11-05 MED ORDER — HEPARIN SODIUM (PORCINE) 1000 UNIT/ML IJ SOLN
INTRAMUSCULAR | Status: DC | PRN
  Administered 2016-11-05: 8000 [IU] via INTRAVENOUS

## 2016-11-05 MED ORDER — CLONIDINE HCL 0.2 MG/24HR TD PTWK
0.2000 mg | MEDICATED_PATCH | TRANSDERMAL | Status: DC
  Filled 2016-11-05: qty 1

## 2016-11-05 MED ORDER — DEXMEDETOMIDINE HCL IN NACL 400 MCG/100ML IV SOLN
0.4000 ug/kg/h | INTRAVENOUS | Status: DC
  Filled 2016-11-05: qty 100

## 2016-11-05 MED ORDER — ARTIFICIAL TEARS OPHTHALMIC OINT
TOPICAL_OINTMENT | OPHTHALMIC | Status: AC
  Filled 2016-11-05: qty 3.5

## 2016-11-05 MED ORDER — PRO-STAT SUGAR FREE PO LIQD
30.0000 mL | Freq: Two times a day (BID) | ORAL | Status: DC
  Administered 2016-11-05 – 2016-11-06 (×4): 30 mL
  Filled 2016-11-05 (×4): qty 30

## 2016-11-05 MED ORDER — CLONAZEPAM 1 MG PO TABS: 1.0000 mg | ORAL_TABLET | Freq: Two times a day (BID) | ORAL | Status: DC

## 2016-11-05 MED ORDER — MIDAZOLAM HCL 2 MG/2ML IJ SOLN
INTRAMUSCULAR | Status: DC | PRN
Start: 2016-11-05 — End: 2016-11-05
  Administered 2016-11-05: 2 mg via INTRAVENOUS

## 2016-11-05 MED ORDER — VECURONIUM BROMIDE 10 MG IV SOLR
INTRAVENOUS | Status: DC | PRN
  Administered 2016-11-05 (×2): 5 mg via INTRAVENOUS

## 2016-11-05 MED ORDER — CALCITRIOL 0.25 MCG PO CAPS: 0.5000 ug | ORAL_CAPSULE | Freq: Every day | ORAL | Status: DC

## 2016-11-05 MED ORDER — FENTANYL CITRATE (PF) 250 MCG/5ML IJ SOLN
INTRAMUSCULAR | Status: AC
  Filled 2016-11-05: qty 5

## 2016-11-05 MED ORDER — AMLODIPINE 1 MG/ML ORAL SUSPENSION: 5.0000 mg | Freq: Every day | ORAL | Status: DC

## 2016-11-05 MED ORDER — ROCURONIUM BROMIDE 100 MG/10ML IV SOLN
INTRAVENOUS | Status: DC | PRN
  Administered 2016-11-05: 20 mg via INTRAVENOUS
  Administered 2016-11-05: 30 mg via INTRAVENOUS
  Administered 2016-11-05: 50 mg via INTRAVENOUS

## 2016-11-05 MED ORDER — VECURONIUM BROMIDE 10 MG IV SOLR
INTRAVENOUS | Status: AC
  Filled 2016-11-05: qty 10

## 2016-11-05 MED ORDER — ARTIFICIAL TEARS OPHTHALMIC OINT
TOPICAL_OINTMENT | OPHTHALMIC | Status: DC | PRN
  Administered 2016-11-05: 1 via OPHTHALMIC

## 2016-11-05 MED ORDER — HEPARIN SODIUM (PORCINE) 5000 UNIT/ML IJ SOLN
5000.0000 [IU] | Freq: Three times a day (TID) | INTRAMUSCULAR | Status: DC
  Administered 2016-11-06 – 2016-11-18 (×34): 5000 [IU] via SUBCUTANEOUS
  Filled 2016-11-05 (×38): qty 1

## 2016-11-05 MED ORDER — DEXMEDETOMIDINE HCL IN NACL 400 MCG/100ML IV SOLN
INTRAVENOUS | Status: DC | PRN
  Administered 2016-11-05: .5 ug/kg/h via INTRAVENOUS

## 2016-11-05 MED ORDER — SODIUM CHLORIDE 0.9 % IV SOLN
0.0000 ug/kg/h | INTRAVENOUS | Status: DC
  Administered 2016-11-05: 0.7 ug/kg/h via INTRAVENOUS
  Administered 2016-11-05: 0.697 ug/kg/h via INTRAVENOUS
  Filled 2016-11-05: qty 2

## 2016-11-05 MED ORDER — SACCHAROMYCES BOULARDII 250 MG PO CAPS: 250.0000 mg | ORAL_CAPSULE | Freq: Two times a day (BID) | ORAL | Status: DC

## 2016-11-05 MED ORDER — PROPOFOL 10 MG/ML IV BOLUS
INTRAVENOUS | Status: DC | PRN
  Administered 2016-11-05: 150 mg via INTRAVENOUS

## 2016-11-05 MED ORDER — DOCUSATE SODIUM 50 MG/5ML PO LIQD
100.0000 mg | Freq: Every day | ORAL | Status: DC
  Administered 2016-11-05 – 2016-11-06 (×2): 100 mg
  Filled 2016-11-05 (×2): qty 10

## 2016-11-05 MED ORDER — PIPERACILLIN-TAZOBACTAM IN DEX 2-0.25 GM/50ML IV SOLN
2.2500 g | INTRAVENOUS | Status: DC
  Filled 2016-11-05: qty 50

## 2016-11-05 MED ORDER — VALPROATE SODIUM 250 MG/5ML PO SOLN
500.0000 mg | Freq: Two times a day (BID) | ORAL | Status: DC
  Administered 2016-11-05 – 2016-11-06 (×3): 500 mg
  Filled 2016-11-05 (×4): qty 10

## 2016-11-05 MED ORDER — PROTAMINE SULFATE 10 MG/ML IV SOLN
INTRAVENOUS | Status: AC
  Filled 2016-11-05: qty 5

## 2016-11-05 MED ORDER — AMLODIPINE BESYLATE 5 MG PO TABS
5.0000 mg | ORAL_TABLET | Freq: Every day | ORAL | Status: DC
  Administered 2016-11-06 – 2016-11-09 (×4): 5 mg
  Filled 2016-11-05 (×4): qty 1

## 2016-11-05 MED ORDER — VITAL HIGH PROTEIN PO LIQD
1000.0000 mL | ORAL | Status: DC
  Administered 2016-11-05 (×2)
  Administered 2016-11-05: 1000 mL
  Administered 2016-11-05 – 2016-11-06 (×12)

## 2016-11-05 MED ORDER — HEMOSTATIC AGENTS (NO CHARGE) OPTIME
TOPICAL | Status: DC | PRN
  Administered 2016-11-05 (×2): 1 via TOPICAL

## 2016-11-05 MED ORDER — FENTANYL CITRATE (PF) 100 MCG/2ML IJ SOLN
25.0000 ug | INTRAMUSCULAR | Status: DC | PRN
Start: 2016-11-05 — End: 2016-11-05

## 2016-11-05 MED ORDER — 0.9 % SODIUM CHLORIDE (POUR BTL) OPTIME
TOPICAL | Status: DC | PRN
  Administered 2016-11-05: 3000 mL

## 2016-11-05 MED ORDER — MIDAZOLAM HCL 2 MG/2ML IJ SOLN
INTRAMUSCULAR | Status: AC
  Filled 2016-11-05: qty 2

## 2016-11-05 MED ORDER — LABETALOL HCL 200 MG PO TABS
200.0000 mg | ORAL_TABLET | Freq: Two times a day (BID) | ORAL | Status: DC
  Administered 2016-11-05 – 2016-11-07 (×4): 200 mg
  Filled 2016-11-05 (×4): qty 1

## 2016-11-05 MED ORDER — AMLODIPINE BESYLATE 10 MG PO TABS: 10.0000 mg | ORAL_TABLET | Freq: Every day | ORAL | Status: DC

## 2016-11-05 MED ORDER — ROCURONIUM BROMIDE 10 MG/ML (PF) SYRINGE
PREFILLED_SYRINGE | INTRAVENOUS | Status: AC
  Filled 2016-11-05: qty 5

## 2016-11-05 MED ORDER — HEPARIN SODIUM (PORCINE) 1000 UNIT/ML IJ SOLN
INTRAMUSCULAR | Status: AC
  Filled 2016-11-05: qty 3

## 2016-11-05 MED ORDER — CLONAZEPAM 1 MG PO TABS
1.0000 mg | ORAL_TABLET | Freq: Two times a day (BID) | ORAL | Status: DC
  Administered 2016-11-05 – 2016-11-06 (×2): 1 mg
  Filled 2016-11-05 (×2): qty 1

## 2016-11-05 MED ORDER — SODIUM CHLORIDE 0.9 % IV SOLN
INTRAVENOUS | Status: DC | PRN
  Administered 2016-11-05: 09:00:00

## 2016-11-05 MED ORDER — DOCUSATE SODIUM 100 MG PO CAPS: 100.0000 mg | ORAL_CAPSULE | Freq: Every day | ORAL | Status: DC

## 2016-11-05 MED ORDER — PROTAMINE SULFATE 10 MG/ML IV SOLN
INTRAVENOUS | Status: DC | PRN
  Administered 2016-11-05: 40 mg via INTRAVENOUS

## 2016-11-05 MED ORDER — CHLORHEXIDINE GLUCONATE 0.12% ORAL RINSE (MEDLINE KIT)
15.0000 mL | Freq: Two times a day (BID) | OROMUCOSAL | Status: DC
  Administered 2016-11-05 – 2016-11-09 (×5): 15 mL via OROMUCOSAL

## 2016-11-05 SURGICAL SUPPLY — 72 items
ADH SKN CLS APL DERMABOND .7 (GAUZE/BANDAGES/DRESSINGS) ×4
AGENT HMST SPONGE THK3/8 (HEMOSTASIS) ×4
BAG ISL DRAPE 18X18 STRL (DRAPES) ×2
BAG ISOLATION DRAPE 18X18 (DRAPES) IMPLANT
BANDAGE ELASTIC 4 VELCRO ST LF (GAUZE/BANDAGES/DRESSINGS) ×2 IMPLANT
BLADE SURG 10 STRL SS (BLADE) ×2 IMPLANT
BNDG GAUZE ELAST 4 BULKY (GAUZE/BANDAGES/DRESSINGS) ×2 IMPLANT
CANISTER SUCT 3000ML PPV (MISCELLANEOUS) ×4 IMPLANT
CANNULA VESSEL 3MM 2 BLNT TIP (CANNULA) ×2 IMPLANT
CATH EMB 3FR 40CM (CATHETERS) ×2 IMPLANT
CATH EMB 4FR 40CM (CATHETERS) ×2 IMPLANT
CLIP TI MEDIUM 24 (CLIP) ×4 IMPLANT
CLIP TI WIDE RED SMALL 24 (CLIP) ×4 IMPLANT
COVER PROBE W GEL 5X96 (DRAPES) ×4 IMPLANT
DERMABOND ADVANCED (GAUZE/BANDAGES/DRESSINGS) ×4
DERMABOND ADVANCED .7 DNX12 (GAUZE/BANDAGES/DRESSINGS) IMPLANT
DRAPE ISOLATION BAG 18X18 (DRAPES) ×2
DRSG COVADERM 4X10 (GAUZE/BANDAGES/DRESSINGS) ×2 IMPLANT
DRSG COVADERM 4X8 (GAUZE/BANDAGES/DRESSINGS) ×2 IMPLANT
DRSG EMULSION OIL 3X3 NADH (GAUZE/BANDAGES/DRESSINGS) ×2 IMPLANT
ELECT REM PT RETURN 9FT ADLT (ELECTROSURGICAL) ×8
ELECTRODE REM PT RTRN 9FT ADLT (ELECTROSURGICAL) ×2 IMPLANT
GAUZE SPONGE 4X4 12PLY STRL LF (GAUZE/BANDAGES/DRESSINGS) ×2 IMPLANT
GLOVE BIO SURGEON STRL SZ 6.5 (GLOVE) ×3 IMPLANT
GLOVE BIO SURGEON STRL SZ7 (GLOVE) ×6 IMPLANT
GLOVE BIO SURGEON STRL SZ8.5 (GLOVE) ×2 IMPLANT
GLOVE BIO SURGEONS STRL SZ 6.5 (GLOVE) ×3
GLOVE BIOGEL PI IND STRL 6.5 (GLOVE) IMPLANT
GLOVE BIOGEL PI IND STRL 7.0 (GLOVE) IMPLANT
GLOVE BIOGEL PI IND STRL 7.5 (GLOVE) ×2 IMPLANT
GLOVE BIOGEL PI INDICATOR 6.5 (GLOVE) ×4
GLOVE BIOGEL PI INDICATOR 7.0 (GLOVE) ×6
GLOVE BIOGEL PI INDICATOR 7.5 (GLOVE) ×4
GLOVE ECLIPSE 6.5 STRL STRAW (GLOVE) ×2 IMPLANT
GLOVE SURG SS PI 6.5 STRL IVOR (GLOVE) ×4 IMPLANT
GOWN STRL REUS W/ TWL LRG LVL3 (GOWN DISPOSABLE) ×6 IMPLANT
GOWN STRL REUS W/ TWL XL LVL3 (GOWN DISPOSABLE) IMPLANT
GOWN STRL REUS W/TWL LRG LVL3 (GOWN DISPOSABLE) ×24
GOWN STRL REUS W/TWL XL LVL3 (GOWN DISPOSABLE) ×4
HEMOSTAT SPONGE AVITENE ULTRA (HEMOSTASIS) ×4 IMPLANT
INSERT FOGARTY SM (MISCELLANEOUS) ×2 IMPLANT
KIT BASIN OR (CUSTOM PROCEDURE TRAY) ×4 IMPLANT
KIT ROOM TURNOVER OR (KITS) ×4 IMPLANT
LOOP VESSEL MAXI BLUE (MISCELLANEOUS) ×2 IMPLANT
MARKER SKIN DUAL TIP RULER LAB (MISCELLANEOUS) ×2 IMPLANT
NS IRRIG 1000ML POUR BTL (IV SOLUTION) ×8 IMPLANT
PACK PERIPHERAL VASCULAR (CUSTOM PROCEDURE TRAY) ×4 IMPLANT
PAD ARMBOARD 7.5X6 YLW CONV (MISCELLANEOUS) ×8 IMPLANT
SPONGE LAP 18X18 X RAY DECT (DISPOSABLE) ×2 IMPLANT
SPONGE LAP 4X18 X RAY DECT (DISPOSABLE) ×6 IMPLANT
STAPLER VISISTAT 35W (STAPLE) ×4 IMPLANT
STOPCOCK 4 WAY LG BORE MALE ST (IV SETS) ×4 IMPLANT
SUT ETHILON 3 0 PS 1 (SUTURE) ×2 IMPLANT
SUT MNCRL AB 4-0 PS2 18 (SUTURE) ×12 IMPLANT
SUT PROLENE 5 0 C 1 24 (SUTURE) ×14 IMPLANT
SUT PROLENE 6 0 BV (SUTURE) ×12 IMPLANT
SUT PROLENE 7 0 BV 1 (SUTURE) ×2 IMPLANT
SUT SILK 2 0 (SUTURE) ×4
SUT SILK 2 0 FS (SUTURE) ×2 IMPLANT
SUT SILK 2-0 18XBRD TIE 12 (SUTURE) IMPLANT
SUT SILK 3 0 (SUTURE) ×8
SUT SILK 3-0 18XBRD TIE 12 (SUTURE) IMPLANT
SUT SILK 4 0 (SUTURE) ×4
SUT SILK 4-0 18XBRD TIE 12 (SUTURE) IMPLANT
SUT VIC AB 2-0 CT1 27 (SUTURE) ×8
SUT VIC AB 2-0 CT1 TAPERPNT 27 (SUTURE) ×4 IMPLANT
SUT VIC AB 3-0 SH 27 (SUTURE) ×24
SUT VIC AB 3-0 SH 27X BRD (SUTURE) ×6 IMPLANT
SYRINGE 3CC LL L/F (MISCELLANEOUS) ×4 IMPLANT
TOWEL GREEN STERILE FF (TOWEL DISPOSABLE) ×2 IMPLANT
UNDERPAD 30X30 (UNDERPADS AND DIAPERS) ×4 IMPLANT
WATER STERILE IRR 1000ML POUR (IV SOLUTION) ×4 IMPLANT

## 2016-11-05 NOTE — Procedures (Signed)
History: 46 yo M with AMS  Sedation: Precedex, lorazepam 6mg  during the  given night prior.  Technique: This is a 21 channel routine scalp EEG performed at the bedside with bipolar and monopolar montages arranged in accordance to the international 10/20 system of electrode placement. One channel was dedicated to EKG recording.    Background: There is an excess of frontocentrally predominant beta activity as well as irregular delta and theta activities. Following stimulation, there is a PDR of 9 Hz that attenuates with eye opening. There are sleep spindles and k-complexes seen.   Photic stimulation: Physiologic driving is not performed  EEG Abnormalities: 1) Generalized irregular slow activity.   Clinical Interpretation: This EEG is consistent with a generalized non-specific cerebral dysfunction(encephalopathy). There was no seizure or seizure predisposition recorded on this study. Please note that a normal EEG does not preclude the possibility of epilepsy.   Roland Rack, MD [facility] [phone]  If 7pm- 7am, please page neurology on call as listed in [place].

## 2016-11-05 NOTE — Progress Notes (Addendum)
[facility] Progress Note   Subjective:  s/p OR today- R fem-pop bypass with R great toe amputation  Objective Vitals:   11/04/16 1500 11/04/16 2150 11/05/16 0326 11/05/16 0645  BP: (!) 173/114 (!) 179/92 (!) 153/85 (!) 169/78  Pulse: 87 86 78 79  Resp: 20 20 20 20   Temp: 98.5 F (36.9 C) 98.5 F (36.9 C) 98.1 F (36.7 C) 97.5 F (36.4 C)  TempSrc: Oral Oral Oral Oral  SpO2:  96% 93% 95%  Weight: 82.8 kg (182 lb 8.7 oz)   80.3 kg (177 lb 0.5 oz)  Height:       Physical Exam General: intubated, sedated Heart: RRR; no murmur Lungs: coarse mechanical sounds bilaterally but no crackles Abdomen: soft, non-tender. Extremities: No LE edema. R foot with sterile dressing Dialysis Access: PD cath and L forearm AVF + thrill- softer than yesterday.  Additional Objective Labs: Basic Metabolic Panel:  Recent Labs Lab 11/03/16 0412 11/04/16 0508 11/05/16 0504  NA 138 141 140  K 3.4* 3.7 3.3*  CL 94* 98* 98*  CO2 26 26 25   GLUCOSE 120* 89 107*  BUN 42* 45* 34*  CREATININE 11.41* 12.54* 11.36*  CALCIUM 7.8* 7.9* 7.9*  PHOS 6.9* 6.7* 6.6*   Liver Function Tests:  Recent Labs Lab 11/04/16 0508 11/04/16 1445 11/05/16 0504  AST  --  22  --   ALT  --  24  --   ALKPHOS  --  219*  --   BILITOT  --  0.6  --   PROT  --  6.0*  --   ALBUMIN 2.1* 2.2* 2.0*   CBC:  Recent Labs Lab 10/30/16 0614 10/31/16 0540 11/01/16 0425 11/03/16 0412 11/05/16 0504  WBC 13.2* 11.1* 13.2* 14.0* 15.5*  NEUTROABS  --  6.5 9.0*  --   --   HGB 10.4* 8.9* 9.5* 10.2* 10.7*  HCT 33.0* 29.2* 30.3* 32.7* 34.1*  MCV 79.1 79.3 79.5 79.4 80.2  PLT 588* 495* 520* 543* 535*   CBG:  Recent Labs Lab 10/30/16 1050 11/03/16 2023  GLUCAP 78 84   Medications: . sodium chloride Stopped (11/05/16 1412)  . sodium chloride    . dialysis solution 1.5% low-MG/low-CA    . piperacillin-tazobactam (ZOSYN)  IV 0 g (11/05/16 0047)  . [START ON 11/09/2016] vancomycin     . [START ON  11/06/2016] amLODipine  10 mg Per Tube Daily  . aspirin  81 mg Per Tube Daily  . buPROPion  150 mg Oral Daily   And  . buPROPion  300 mg Oral QHS  . calcitRIOL  0.5 mcg Oral Daily  . calcium acetate  1,334 mg Oral TID WC  . cinacalcet  60 mg Oral Q breakfast  . cloNIDine  0.2 mg Transdermal Weekly  . darbepoetin (ARANESP) injection - NON-DIALYSIS  100 mcg Subcutaneous Q Sun-1800  . [START ON 11/06/2016] docusate sodium  100 mg Oral Daily  . feeding supplement (NEPRO CARB STEADY)  237 mL Oral BID BM  . feeding supplement (PRO-STAT SUGAR FREE 64)  30 mL Per Tube BID  . feeding supplement (VITAL HIGH PROTEIN)  1,000 mL Per Tube Q24H  . gentamicin cream  1 application Topical Daily  . [START ON 11/06/2016] heparin  5,000 Units Subcutaneous Q8H  . isosorbide dinitrate  5 mg Per Tube BID  . labetalol  200 mg Per Tube BID  . LORazepam  1 mg Intravenous Q6H  . pantoprazole  40 mg Oral Daily  . potassium chloride  30 mEq Oral Daily  . [START ON 11/06/2016] pravastatin  20 mg Per Tube Daily  . saccharomyces boulardii  250 mg Oral BID  . sodium chloride flush  3 mL Intravenous Q12H  . Valproate Sodium  500 mg Oral BID  . Vitamin D (Ergocalciferol)  50,000 Units Oral Q7 days    Dialysis Orders: [place] home therapies.  CCPD: 3L fill 5 exchanges at night with 5th fill manually drained 1-1/2 hours later. Uses mostly 1's and 2's at home.   Assessment/Plan: 1. Ischemic/gangrenous R great toe/Severe PAD: s/p OR with VVS Continue Vanc/Zosyn (next Vanc level ordered for 5/8).  2. ESRD: Continue CPPD; 1.5% solution.   3. Anemia: Hgb 10.2, continue Aranesp weekly, expect to need upward adjustment in the setting of surgery. 4. Secondary hyperparathyroidism: Phos high, but coming down. Continue binders/VDRA/sensipar. 5. HTN/volume: BP high, does not seem to have volume excess. Continue PD with all 1.5%. Follow BP. 6. Nutrition: Alb 2.1. Continue protein supps. 7. Abnormal myoview stress test: EF 43%. Reversible,  medium sized, mild basal to mid inferior and basal inferolateral perfusion defect concerning for ischemia. Per cards, nuc medicine study is inaccurate, no plans for further investigation.   8. Acute encephalopathy: infection vs drugs vs intracranial process? Psych and neuro following.  Currently on precedex gtt, will extubate in ICU  Madelon Lips MD 11/05/2016, 3:11 PM  [facility] Pager: [phone]

## 2016-11-05 NOTE — Op Note (Addendum)
OPERATIVE NOTE   PROCEDURE: 1.  Right common femoral artery to below-the-knee popliteal artery bypass with non-reversed ipsilateral greater saphenous vein  2.  Right first ray amputation 3.  Right dorsal ulcer debridement  PRE-OPERATIVE DIAGNOSIS: right great toe gangrene  POST-OPERATIVE DIAGNOSIS: same as above   SURGEON: Adele Barthel, MD  ASSISTANT(S): Leontine Locket, PAC   ANESTHESIA: general  ESTIMATED BLOOD LOSS: 300 cc  FINDING(S): 1.  Calcified common femoral artery with soft segment distally 2.  Calcified popliteal artery but compressible 3.  Tibioperoneal trunk and peroneal artery heavily calcified and non-compressible   4.  Heavy calcification of collateral arteries int he calf 5.  Excellent greater saphenous vein: 4-5 mm in diameter 6.  Palpable pulse in popliteal artery at end of case 7.  Doppler peroneal and posterior tibial artery signals at end of case  SPECIMEN(S):  none  INDICATIONS:   Hayden [name] is a 46 y.o. male who presents with right great toe gangrene.  This patient work-up included two angiograms and attempt at endovascular intervention. His calcification in his popliteal artery made it impossible to recannulate the right distal superficial femoral artery occlusion.  I offered him right  femoropopliteal vs femorotibial bypass and right great toe amputation.  The risk, benefits, and alternative for bypass operations were discussed with the patient and wife.  The patient and wife are aware the risks include but are not limited to: bleeding, infection, myocardial infarction, stroke, limb loss, nerve damage, need for additional procedures in the future, wound complications, and inability to complete the bypass.  The patient and wife are aware of these risks and agreed to proceed.   DESCRIPTION: After full informed written consent was obtained, the patient was brought back to the operating room and placed supine upon the operating table.  Prior to  induction, the patient was given intravenous antibiotics.  After obtaining adequate anesthesia, the patient was prepped and draped in the standard fashion for a femoral to popliteal bypass operation.    At this point, attention was turned to the calf.  An longitudinal incision was made one finger-width posterior to the tibia.  Using blunt dissection and electrocautery, a plane was developed through the subcutaneous tissue and fascia down to the popliteal space.  The popliteal vein was dissected out and retracted medially and posteriorly.  The below-the-knee popliteal artery was dissected away from lateral popliteal vein.  This popliteal artery was found on exam to be calcified but compressible.  I then took down the soleus muscle with electrocautery and dissected out the tibioperoneal trunk down the peroneal artery.  Both were severely calcified and non-compressible.  Subsequently, I elected to use the below-the-knee popliteal artery as a distal target.  I packed this incision with a wet ray-tec.  Attention was turned to the right groin.  A longitudinal incision was made over the right common femoral artery.  Using blunt dissection and electrocautery, the artery was dissected out from the inguinal ligament down to the femoral bifurcation.  The superficial femoral artery, profunda femoral artery, and external iliac artery were dissected out and vessel loops applied.  Circumflex branches were also dissected and controlled with silk ties.  This common femoral artery was found on exam to be calcified but compressible proximally and distally with a segment of heavy calcification in the mid-segment.  I felt this was adequate for inflow as I still could feel a good pulse in the right common femoral artery.    At this point, the  patient's right greater saphenous vein was identified under Sonosite guidance.  Skip incisions was made over the greater saphenous vein from the saphenofemoral junction down to mid-calf.  The  vein conduit was found to be adequate with size: 4-5 mm.  Side branches of greater saphenous vein were tied off with 4-0 silk or clipped with small titanium clips.  The saphenofemoral junction was clamped and the greater saphenous vein was transected.  The saphenofemoral junction was oversewn in a double layer with a running stitch of 5-0 prolene.  The distal extent of this conduit was tied off at the level of mid-calf and the conduit transected proximally.  The harvested vein conduit was soaked in a heparinized saline solution.  A vessel cannula was tied to the distal end of the vein and the vein was tested by hydrodistension.  Leaks in the conduit were repaired with 4-0 silk ties and 7-0 Prolene stitches.  At the end of this process, I felt the conduit to be: good quality, 4-5 mm throughout.  At this point, I bluntly dissected a space between the femoral condyles adjacent to the below-the-knee popliteal vessels.  I bluntly passed the long Gore metal tunneler between the femoral condyles in a subsartorial fashion to the groin incision.  At this point, the patient was given 8000 units of Heparin intravenously, which was a therapeutic bolus.  After waiting three minutes, the external iliac artery, superficial femoral artery and profunda femoral artery were clamped.  An incision was made in the common femoral artery and extended proximally and distally with a Potts scissor.  The proximal vein conduit was spatulated to the dimensions of the arteriotomy.  The conduit was sewn to the common femoral artery with a running stitch of 5-0 prolene.  Prior to completing this anastomosis, all vessels were backbled.  No thrombus was noted from any vessels and backbleeding was: excellent from the external iliac artery and vigorous from the superficial femoral artery and profunda femoral artery.  The anastomosis was completed in the usual fashion.  At this point, I passed the [place] valvutome through the vein conduit twice, lysing  bluntly any valves.  There was an excellent pulse in this vein graft.  I allowed it to bleed and there was good pulsatile bleeding.  I clamped the distal extent and marked the anterior orientation of the graft.  I clamped the vein graft just distal to the anastomosis.  The bullet on the previously placed tunneler was removed and then the distal vein conduit was sewn to the inner cannula with a 2-0 Silk.  I passed the conduit through the metal tunnel, taking care to maintain the orientation of the conduit.  Attention was then turned to the popliteal exposure.   I reset the exposure of the popliteal space.  I verified the popliteal artery was appropriately marked.  I determined the target segment for the anastomosis.  I clamped the below-the-knee popliteal artery proximally and distally with vessel loops.  I made an incision with a 11-blade in the artery and extended it proximally and distally with a Potts scissor.    I pulled the vein conduit to appropriate tension and length, taking into account straightening out the leg.  I adjusted the length of the conduit sharply.  I spatulated this conduit to meet the dimensions of the arteriotomy.  The conduit was sewn to the common femoral artery with a running stitch of 5-0 prolene due to calcification in the artery.  Prior to completing this anastomosis, all vessels  were backbled.  No thrombus was noted from any vessels and backbleeding was: limited from both ends of the popliteal artery.  The bypass conduit was allowed to bleed in an antegrade fashion.  The bleeding was: excellent, pulsatile.  The anastomosis was completed in the usual fashion.  At this point, all incisions were washed out and Avitene was placed into both incisions.  The continuous doppler exam demonstrated distally: dopplerable peroneal and posterior tibial artery signals which disappeared with conduit compression.  The below-the-knee popliteal artery was palpable.  At this point, I sequentially  inspected the groin and then the calf incisions.  Bleeding points in the suture lines were controlled with 5-0 Prolene stitches.  This was necessary due to the calcific arterial walls.  The groin and calf were washed out and repacked with Avitene.  After repeating this process twice, there was no further active bleeding.  The calf was repaired with interrupted deep sutures to reapproximate the deep muscles and a running stitch of 3-0 Vicryl in the subcutaneous tissue.  The skin was reapproximated with a running subcuticular of 4-0 Vicryl.  After cleaning and drying the skin, the skin was reinforced with Dermabond.  Attention was turned to the groin.  The groin was repaired with a double layer of 2-0 Vicryl immediately superficial to the bypass conduit.  The superficial subcutaneous tissue was reapproximated with a double layer of 3-0 Vicryl.  The skin was reapproximated with a running subcuticular of 4-0 Vicryl.  The skin was cleaned, dried, and reinforced with Dermabond.  The vein harvest incisions were closed with a layer of 3-0 Vicryl in the subcutaneous tissue.  The skin was then reapproximated with staples.  The skin was cleaned, dried, and sterile bandages applied.  At this point, I sterilely covered the rest of the leg and took off the leg bag on the right foot.  I made a racquet incision around the right great toe.  There was adequate bleeding.  I dissected down to the proximal phalange.  I took off the toe at the [facility] joint.  The patient's metatarsal head was significantly larger than usual.  I dissected this metatarsal head out and debrided the metatarsal head with a rongeur.  The sesamoid bone deep to the metatarsal appeared to be interfering with the skin closure over this amputation, so I had dissect out the bone with electrocautery and remove it also.  At this point, I felt there was adequate laxity in the skin to close the incision.  I reapproximated the fascia with interrupted 3-0  Vicryl stitches.  I then reapproximated the skin with interrupted 3-0 Nylon stitches.    In the process of washing up the right foot, I re-examined a eschar on the dorsum of this right foot.  I manually debrided the eschar and the underlying subcutaneous tissue.  This tissue appeared to be viable.  I also inspected the plantar surface wound.  This was found to be a drained blister wall.  The right foot was washed off and the amputation site dressed with Mepitel, fluffs and sterile dressings.  The dorsal ulcer was dressed with a sterile bandage.    COMPLICATIONS: none  CONDITION: stable   Adele Barthel, MD, [facility] [place] Office: [phone] Pager: [phone]  11/05/2016, 2:08 PM

## 2016-11-05 NOTE — Progress Notes (Signed)
Bedside EEG completed, results pending. 

## 2016-11-05 NOTE — Anesthesia Postprocedure Evaluation (Signed)
Anesthesia Post Note  Patient: Hayden [name]  Procedure(s) Performed: Procedure(s) (LRB): BYPASS GRAFT FEMORAL-POPLITEAL ARTERY USING NON-REVERSED RIGHT GREATER SAPPHENOUS VEIN (Right) RIGHT GREATER SAPPHENOUS VEIN HARVEST (Right) AMPUTATION RIGHT FIRST RAY (Right)  Patient location during evaluation: SICU Anesthesia Type: General Level of consciousness: sedated Pain management: pain level controlled Vital Signs Assessment: post-procedure vital signs reviewed and stable Respiratory status: patient remains intubated per anesthesia plan Cardiovascular status: stable Anesthetic complications: no       Last Vitals:  Vitals:   11/05/16 1600 11/05/16 1615  BP: 108/69   Pulse: 65   Resp: 16   Temp:  (!) 35.9 C    Last Pain:  Vitals:   11/05/16 1615  TempSrc: Axillary  PainSc:                  Sharlon Pfohl,W. EDMOND

## 2016-11-05 NOTE — Interval H&P Note (Signed)
   History and Physical Update  The patient was interviewed and re-examined.  The patient's previous History and Physical has been reviewed and is unchanged from my consult except for: interval angiograms and attempts at R SFA recannulation.  Unfortunately, re-entry into the popliteal artery could not be obtain so I have recommended: R femoropopliteal vs femorotibial bypass, R great toe amputation.   The risk, benefits, and alternative for bypass operations were discussed with the patient.    The patient is aware the risks include but are not limited to: bleeding, infection, myocardial infarction, stroke, limb loss, nerve damage, need for additional procedures in the future, wound complications, and inability to complete the bypass.   The patient is aware of these risks and agreed to proceed.   Adele Barthel, MD, FACS [facility] [place] Office: [phone] Pager: [phone]  11/05/2016, 7:00 AM

## 2016-11-05 NOTE — Transfer of Care (Signed)
Immediate Anesthesia Transfer of Care Note  Patient: Hayden [name]  Procedure(s) Performed: Procedure(s): BYPASS GRAFT FEMORAL-POPLITEAL ARTERY USING NON-REVERSED RIGHT GREATER SAPPHENOUS VEIN (Right) RIGHT GREATER SAPPHENOUS VEIN HARVEST (Right) AMPUTATION RIGHT FIRST RAY (Right)  Patient Location: SICU  Anesthesia Type:General  Level of Consciousness: sedated and Patient remains intubated per anesthesia plan  Airway & Oxygen Therapy: Patient remains intubated per anesthesia plan and Patient placed on Ventilator (see vital sign flow sheet for setting)  Post-op Assessment: Report given to RN and Post -op Vital signs reviewed and stable  Post vital signs: Reviewed and stable  Last Vitals:  Vitals:   11/05/16 0326 11/05/16 0645  BP: (!) 153/85 (!) 169/78  Pulse: 78 79  Resp: 20 20  Temp: 36.7 C 36.4 C    Last Pain:  Vitals:   11/05/16 0645  TempSrc: Oral  PainSc:       Patients Stated Pain Goal: 0 (28/20/60 1561)  Complications: No apparent anesthesia complications

## 2016-11-05 NOTE — Progress Notes (Signed)
[place] Progress Note Patient Name: Hayden [name] DOB: [birth year] MRN: [id number]   Date of Service  11/05/2016  HPI/Events of Note  Trop barely up, in setting crt 11 ecg neg Dc trop   enceph per notes likley related to acute infection  Post op on vent Hold CT for now Get EEG as at bedside  eICU Interventions       Intervention Category Intermediate Interventions: Diagnostic test evaluation  Raylene Miyamoto. 11/05/2016, 4:14 PM

## 2016-11-05 NOTE — Anesthesia Procedure Notes (Signed)
Procedure Name: Intubation Date/Time: 11/05/2016 7:35 AM Performed by: Jacquiline Doe A Pre-anesthesia Checklist: Patient identified, Emergency Drugs available, Suction available and Patient being monitored Patient Re-evaluated:Patient Re-evaluated prior to inductionOxygen Delivery Method: Circle System Utilized and Circle system utilized Preoxygenation: Pre-oxygenation with 100% oxygen Intubation Type: IV induction and Cricoid Pressure applied Ventilation: Mask ventilation without difficulty Laryngoscope Size: Mac and 4 Grade View: Grade II Tube type: Subglottic suction tube Tube size: 7.5 mm Number of attempts: 1 Airway Equipment and Method: Stylet Placement Confirmation: ETT inserted through vocal cords under direct vision,  positive ETCO2 and breath sounds checked- equal and bilateral Secured at: 23 cm Tube secured with: Tape Dental Injury: Teeth and Oropharynx as per pre-operative assessment

## 2016-11-05 NOTE — Progress Notes (Signed)
[place] Progress Note Patient Name: Hayden [name] DOB: 11/29/1970 MRN: [id number]   Date of Service  11/05/2016  HPI/Events of Note  On precedex  Need order  eICU Interventions       Intervention Category Intermediate Interventions: Communication with other healthcare providers and/or family  Raylene Miyamoto. 11/05/2016, 3:26 PM

## 2016-11-05 NOTE — Progress Notes (Signed)
   Daily Progress Note   Given AMS, Anesthesia and I elected to keep patient intubated and sedated on Dex gtt and extubate the patient in a controlled fashion in the ICU.  - PCCM contact and plan discussed with them - They will be managing the sedation and vent   Adele Barthel, MD, FACS [facility] [place]: [phone] Pager: [phone]  11/05/2016, 2:37 PM

## 2016-11-05 NOTE — OR Nursing (Signed)
1st Call to 2900 @1355 . OUT OF ROOM CALL @1415 .

## 2016-11-05 NOTE — Progress Notes (Signed)
Unable to get EEG today. Pt unavailable (surgery)

## 2016-11-05 NOTE — Consult Note (Signed)
PULMONARY / CRITICAL CARE MEDICINE   Name: Hayden [name] MRN: [id number] DOB: [birth year]    ADMISSION DATE:  10/23/2016 CONSULTATION DATE:  11/05/16  REFERRING MD:  Bridgett Larsson  CHIEF COMPLAINT:  Vent Management Post Op  HISTORY OF PRESENT ILLNESS:  Pt is encephelopathic; therefore, this HPI is obtained from chart review. Hayden [name] is a 46 y.o. M with PMH as outlined below including ESRD on peritoneal dialysis. He was admitted 4/25 for dry gangrene and cellulitis on the right foot./Right great toe.  He had been seen in the ED roughly 1 week prior to this was treated on vancomycin but ended up leaving AMA.  He was treated with empiric antibiotics but unfortunately failed conservative management.  Vascular surgery was consulted and arteriogram showed severe calcification of his entire arterial system with R SFA occlusion.  He was taken to the OR 5/8 for right CFA to below knee popliteal artery bypass, right first toe amputation, right dorsal ulcer debridement. Following the procedure, he returned to the ICU and remained on the ventilator. PCCM was called for management while in ICU and for possible extubation.  Of note, 5/7 patient was extremely agitated and combative and had altered mental status. Neurology was consulted and felt that etiology was likely metabolic versus questionable withdrawal. CT head and EEG were ordered, but as of 5/8, these have not been performed.   PAST MEDICAL HISTORY :  He  has a past medical history of Anemia (March 2014); Cancer [facility]); CHF (congestive heart failure) ([place]) (2010); Depression; Diabetes mellitus; ESRD on peritoneal dialysis ([place]) (2018); History of kidney cancer; Hyperlipidemia; Hypertension; Obesity; PONV (postoperative nausea and vomiting); and Stroke ([place]).  PAST SURGICAL HISTORY: He  has a past surgical history that includes Nephrectomy (Right, 2008); Testicle torsion reduction; Hernia repair; Tonsillectomy and adenoidectomy; AV fistula placement  (Left, 01/26/2013); Cholecystectomy (11/06/2015); Abdominal Aortogram w/Lower Extremity (N/A, 10/28/2016); and lower extremity angiogram (Right, 10/30/2016).  Allergies  Allergen Reactions  . Dilaudid [Hydromorphone Hcl] Other (See Comments)    ABNORMAL BEHAVIORS "VERBALLY AND PHYSICALLY ABUSIVE" PSYCHOSIS  . Morphine And Related Itching and Other (See Comments)    SYNCOPE    No current facility-administered medications on file prior to encounter.    Current Outpatient Prescriptions on File Prior to Encounter  Medication Sig  . ALPRAZolam (XANAX) 1 MG tablet Take 1 mg by mouth at bedtime as needed for anxiety.  Marland Kitchen amLODipine (NORVASC) 10 MG tablet Take 10 mg by mouth daily.  Marland Kitchen aspirin EC 81 MG tablet Take 81 mg by mouth daily.  . cloNIDine (CATAPRES - DOSED IN MG/24 HR) 0.2 mg/24hr patch Place 1 patch onto the skin once a week.  . hydrALAZINE (APRESOLINE) 100 MG tablet Take 100 mg by mouth 2 (two) times daily.  Marland Kitchen labetalol (NORMODYNE) 100 MG tablet Take 150 mg by mouth 2 (two) times daily.   Marland Kitchen omeprazole (PRILOSEC) 40 MG capsule Take 40 mg by mouth daily.  Marland Kitchen PARoxetine (PAXIL) 20 MG tablet Take 40 mg by mouth daily.   . pravastatin (PRAVACHOL) 20 MG tablet Take 20 mg by mouth daily.  Marland Kitchen buPROPion (WELLBUTRIN SR) 150 MG 12 hr tablet Take 150-300 mg by mouth See admin instructions. 150 mg in the morning and 300 mg at bedtime  . calcium acetate (PHOSLO) 667 MG capsule Take 2 capsules (1,334 mg total) by mouth 3 (three) times daily with meals. (Patient not taking: Reported on 10/24/2016)  . furosemide (LASIX) 80 MG tablet Take 80 mg by mouth 2 (  two) times daily.  Marland Kitchen saccharomyces boulardii (FLORASTOR) 250 MG capsule Take 1 capsule (250 mg total) by mouth 2 (two) times daily. (Patient not taking: Reported on 10/24/2016)    FAMILY HISTORY:  His indicated that his mother is alive. He indicated that his father is deceased. He indicated that his other is alive.    SOCIAL HISTORY: He  reports that he  has been smoking Cigarettes.  He has a 2.50 pack-year smoking history. He has never used smokeless tobacco. He reports that he uses drugs, including Marijuana. He reports that he does not drink alcohol.  REVIEW OF SYSTEMS:   Unable to obtain as pt is encephalopathic.  SUBJECTIVE:  On vent, sedated and unresponsive.  VITAL SIGNS: BP (!) 169/78 (BP Location: Right Arm)   Pulse 79   Temp 97.5 F (36.4 C) (Oral)   Resp 20   Ht 5\' 10"  (1.778 m)   Wt 80.3 kg (177 lb 0.5 oz)   SpO2 95%   BMI 25.40 kg/m   HEMODYNAMICS:    VENTILATOR SETTINGS:    INTAKE / OUTPUT: I/O last 3 completed shifts: In: 28458 [P.O.:780; ASTMH:96222; IV Piggyback:200] Out: 26118 [Other:26118]  PHYSICAL EXAMINATION: General:  Middle aged male, chronically ill appearing. Neuro:  Sedated and non-responsive. HEENT:  [place]/AT. MMM. Cardiovascular:  RRR, no M/R/G. Lungs:  Clear bilaterally.  ETT in place. Abdomen:  BS x 4, S/NT/ND. Musculoskeletal:  Right great toe amputation with dressings C/D/I.  Incisions to RLE noted with dressings C/D/I. No edema. Skin:  Warm, dry.  LABS:  BMET  Recent Labs Lab 11/03/16 0412 11/04/16 0508 11/05/16 0504  NA 138 141 140  K 3.4* 3.7 3.3*  CL 94* 98* 98*  CO2 26 26 25   BUN 42* 45* 34*  CREATININE 11.41* 12.54* 11.36*  GLUCOSE 120* 89 107*    Electrolytes  Recent Labs Lab 10/31/16 0540 11/01/16 0425  11/03/16 0412 11/04/16 0508 11/05/16 0504  CALCIUM 6.7* 7.1*  < > 7.8* 7.9* 7.9*  MG 2.1 2.1  --   --   --   --   PHOS 9.4* 8.7*  < > 6.9* 6.7* 6.6*  < > = values in this interval not displayed.  CBC  Recent Labs Lab 11/01/16 0425 11/03/16 0412 11/05/16 0504  WBC 13.2* 14.0* 15.5*  HGB 9.5* 10.2* 10.7*  HCT 30.3* 32.7* 34.1*  PLT 520* 543* 535*    Coag's  Recent Labs Lab 10/30/16 0614  INR 1.24    Sepsis Markers No results for input(s): LATICACIDVEN, PROCALCITON, O2SATVEN in the last 168 hours.  ABG No results for input(s): PHART,  PCO2ART, PO2ART in the last 168 hours.  Liver Enzymes  Recent Labs Lab 11/04/16 0508 11/04/16 1445 11/05/16 0504  AST  --  22  --   ALT  --  24  --   ALKPHOS  --  219*  --   BILITOT  --  0.6  --   ALBUMIN 2.1* 2.2* 2.0*    Cardiac Enzymes No results for input(s): TROPONINI, PROBNP in the last 168 hours.  Glucose  Recent Labs Lab 10/30/16 1050 11/03/16 2023  GLUCAP 78 84    Imaging No results found.   STUDIES:  Abdominal Aortogram 4/30 > no significant renal artery stenosis identified, no significant inflow disease, diffuse calcific disease throughout the infrainguinal arteries, occlusion of the distal superficial femoral artery and above-knee popliteal artery over a length of approximately 15 cm, anterior tibial artery on the right is occluded, the dominant runoff is  the peroneal artery which is diseased distally, posterior tibial artery has severe diffuse disease.  NM Stress Test 5/3 > EF 43%, inferior hypokinesis, reversible, medium sized, mild basal to mid inferior and basal inferolateral perfusion defect, concerning for possible ischemia, intermediate risk study.   ECHO 5/4 > LVEF 60-65%, compared to prior study in 2016, there is now severe LVH, severe concentric hypertrophy, no regional WMA, grade II diastolic dysfunction, moderate LAE, mild mitral stenosis and mild pulmonary hypertension with RSVP of 35 CT Head 5/7 >  EEG 5/7 >      CULTURES: BCx2 4/25 > negative   ANTIBIOTICS: Zosyn 4/25 >  Vancomycin 4/25 x1, 5/2 >  SIGNIFICANT EVENTS: 4/25  Admit  5/07  Seen by Neurology & Psychiatry  5/08  To OR for right femoropopliteal vs femorotibial bypass & right great toe amputation   LINES/TUBES: ETT 5/8 >>   DISCUSSION: 46 y/o M with PMH of former DM (resolved with weight loss), ESRD on PD, renal cancer s/p nephrectomy, HTN/CHF and PVD admitted on 4/25 with right foot redness and swelling after an injury approximately one month ago.  He was found to have  RLE gangrene and BLE PAD.  Hospital course complicated by acute encephalopathy. Taken to OR 5/8 for right CFA to below knee popliteal artery bypass, right first toe amputation, right dorsal ulcer debridement.  ASSESSMENT / PLAN:  PULMONARY A: Acute Respiratory Insufficiency - post operative amputation, RLE bypass  P:   PRVC 8 cc/kg  Wean PEEP / FiO2 for sats > 92% Assess ABG one hour post arrival to ICU  Assess CXR on arrival to ICU and in am Daily SBT / WUA   CARDIOVASCULAR A:  PVD s/p RLE bypass and R great toe amputation  Hx HTN, HLD P:  Continue norvasc, ASA, clonidine patch, isordil, labetalol  Hold scheduled Hydralazine post-op with soft pressures > anticipate he will rebound post anesthesia  Monitor hemodynamics in ICU  PRN hydralazine for SBP >180 Assess troponin, EKG   RENAL A:   CKD on Peritoneal Dialysis  AVF Placement - completed 12/2012 Hypokalemia  P:   Trend BMP  Replace electrolytes as indicated   GASTROINTESTINAL A:   Protein Calorie Malnutrition  Hx Obesity  P:   NPO / OGT  Begin TF   HEMATOLOGIC A:   Anemia - suspect of chronic disease & acute illness  P:  Trend CBC  Heparin SQ for DVT prophylaxis   INFECTIOUS A:   RLE Gangrene s/p R Great Toe Amputation - blood cultures negative on admit  P:   Monitor fever curve / WBC Consider d/c abx in AM if remains stable  ENDOCRINE A:   Prior DM - reportedly none since weight loss, 130lb weight loss P:   Monitor glucose on BMP   NEUROLOGIC A:   Acute Encephalopathy - suspect multifactorial in the setting of possible infection / gangrene with RLE, benzodiazepines / narcotics and poor renal clearance.  Would like to eliminate sedation as source before stopping his home agents.  Hallucination / Anxiety / Depression  ? Seizure - noted 5/6-5/7 but not witnessed, concern for post ictal state with AMS Hx CVA P:   RASS goal: 0 to -1 Precedex gtt  Add klonopin 1mg  BID in place of IV  ativan  PRN fentanyl for pain  Neurology following, appreciate input  Monitor on Wellbutrin, may need to consider stopping as this may lower seizure threshold.  However, his is on large doses and concern for abrupt cessation.  Continue Lorazepam 1mg  Q6 for agitation (per PSY) Continue Depakene 500 mg BID for combative behaviors (per PSY) EEG pending    FAMILY  - Updates: No family at bedside.    - Inter-disciplinary family meet or Palliative Care meeting due by: 5/15  CC time: 35 min.   Montey Hora, [facility] Pager: [phone]  or [phone] 11/05/2016, 3:23 PM

## 2016-11-05 NOTE — Anesthesia Preprocedure Evaluation (Addendum)
Anesthesia Evaluation  Patient identified by MRN, date of birth, ID band Patient unresponsive    Reviewed: Allergy & Precautions, NPO status , Patient's Chart, lab work & pertinent test results, reviewed documented beta blocker date and time , Unable to perform ROS - Chart review only  History of Anesthesia Complications (+) PONV and history of anesthetic complications  Airway Mallampati: II  TM Distance: >3 FB Neck ROM: Full    Dental  (+) Teeth Intact, Dental Advisory Given   Pulmonary Current Smoker,    Pulmonary exam normal breath sounds clear to auscultation       Cardiovascular hypertension, Pt. on home beta blockers and Pt. on medications +CHF   Rhythm:Regular Rate:Normal + Systolic murmurs Echo 0/8/14: Study Conclusions  - Left ventricle: The cavity size was normal. There was severe concentric hypertrophy. Systolic function was normal. The estimated ejection fraction was in the range of 60% to 65%. Wall motion was normal; there were no regional wall motion abnormalities. Doppler parameters are consistent with pseudonormal left ventricular relaxation (grade 2 diastolic dysfunction). The E/e&' ratio is >30 with medial e&' velocity <6, suggesting elevated LV filling pressure. - Aortic valve: Heavily calcified trileaflet aortic valve. Moderate stenosis. Mean gradient (S): 16 mm Hg. Peak gradient (S): 29 mm Hg. Valve area (VTI): 1.41 cm^2. Valve area (Vmax): 1.16 cm^2. - Mitral valve: Moderate posterior MAC. Mildly thickened leaflets. Mild stenosis. Mean gradient (D): 8 mm Hg. Valve area by pressure half-time: 1.9 cm^2. - Left atrium: The atrium was moderately dilated. - Right ventricle: The cavity size was normal. The moderator band was prominent. Systolic function was normal. - Right atrium: The atrium was mildly dilated. - Tricuspid valve: There was mild regurgitation. - Pulmonary arteries: PA peak pressure: 35 mm Hg (S). -  Inferior vena cava: The vessel was normal in size. The   respirophasic diameter changes were in the normal range (>= 50%), consistent with normal central venous pressure.  Impressions:  - Compared to a prior study in 2016, there is now severe LVH, EF remains normal at 60-65%, grade 2 DD with high LV filling pressure. There is now moderate LAE, mild mitral stenosis and mild pulmonary hypertension with RVSP of 35 mmHg.   Neuro/Psych PSYCHIATRIC DISORDERS Depression CVA    GI/Hepatic Neg liver ROS, GERD  ,  Endo/Other  diabetes  Renal/GU ESRF and DialysisRenal diseaseh/o kidney cancer s/p nephrectomy Peritoneal dialysis--MWF     Musculoskeletal negative musculoskeletal ROS (+)   Abdominal   Peds  Hematology  (+) Blood dyscrasia, anemia ,   Anesthesia Other Findings Day of surgery medications reviewed with the patient.  Reproductive/Obstetrics                            Anesthesia Physical Anesthesia Plan  ASA: III  Anesthesia Plan: General   Post-op Pain Management:    Induction: Intravenous  Airway Management Planned: Oral ETT  Additional Equipment:   Intra-op Plan:   Post-operative Plan: Post-operative intubation/ventilation  Informed Consent: I have reviewed the patients History and Physical, chart, labs and discussed the procedure including the risks, benefits and alternatives for the proposed anesthesia with the patient or authorized representative who has indicated his/her understanding and acceptance.   Dental advisory given  Plan Discussed with: CRNA  Anesthesia Plan Comments: (Patient with AMS for past few days. Discussed with Dr Bridgett Larsson who feels AMS may be due to gangrenous toe.  Feels surgery should proceed.  Discussed plan to  keep patient intubated post-operatively and transfer to ICU to recover from surgery.)       Anesthesia Quick Evaluation

## 2016-11-05 NOTE — Progress Notes (Signed)
     Patient intubated and sedated Doppler signals strong right PT/AT Incisions clean and dry  S/P PROCEDURE: 1.  Right common femoral artery to below-the-knee popliteal artery bypass with non-reversed ipsilateral greater saphenous vein  2.  Right first ray amputation 3.  Right dorsal ulcer debridement  COLLINS, EMMA MAUREEN PA-C

## 2016-11-06 ENCOUNTER — Inpatient Hospital Stay (HOSPITAL_COMMUNITY): Payer: Medicare Other

## 2016-11-06 ENCOUNTER — Encounter (HOSPITAL_COMMUNITY): Payer: 59

## 2016-11-06 ENCOUNTER — Encounter (HOSPITAL_COMMUNITY): Payer: Self-pay | Admitting: Vascular Surgery

## 2016-11-06 DIAGNOSIS — F419 Anxiety disorder, unspecified: Secondary | ICD-10-CM

## 2016-11-06 DIAGNOSIS — J9601 Acute respiratory failure with hypoxia: Secondary | ICD-10-CM

## 2016-11-06 DIAGNOSIS — F329 Major depressive disorder, single episode, unspecified: Secondary | ICD-10-CM

## 2016-11-06 DIAGNOSIS — R451 Restlessness and agitation: Secondary | ICD-10-CM

## 2016-11-06 DIAGNOSIS — Z9889 Other specified postprocedural states: Secondary | ICD-10-CM

## 2016-11-06 LAB — CBC
HEMATOCRIT: 35.6 % — AB (ref 39.0–52.0)
HEMOGLOBIN: 11 g/dL — AB (ref 13.0–17.0)
MCH: 24.7 pg — ABNORMAL LOW (ref 26.0–34.0)
MCHC: 30.9 g/dL (ref 30.0–36.0)
MCV: 80 fL (ref 78.0–100.0)
Platelets: 465 10*3/uL — ABNORMAL HIGH (ref 150–400)
RBC: 4.45 MIL/uL (ref 4.22–5.81)
RDW: 20 % — AB (ref 11.5–15.5)
WBC: 17.2 10*3/uL — AB (ref 4.0–10.5)

## 2016-11-06 LAB — GLUCOSE, CAPILLARY
GLUCOSE-CAPILLARY: 98 mg/dL (ref 65–99)
GLUCOSE-CAPILLARY: 126 mg/dL — AB (ref 65–99)
Glucose-Capillary: 210 mg/dL — ABNORMAL HIGH (ref 65–99)
Glucose-Capillary: 102 mg/dL — ABNORMAL HIGH (ref 65–99)
Glucose-Capillary: 96 mg/dL (ref 65–99)

## 2016-11-06 LAB — T3: T3, Total: 68 ng/dL — ABNORMAL LOW (ref 71–180)

## 2016-11-06 LAB — T3, FREE: T3 FREE: 1.4 pg/mL — AB (ref 2.0–4.4)

## 2016-11-06 LAB — BASIC METABOLIC PANEL
ANION GAP: 20 — AB (ref 5–15)
BUN: 42 mg/dL — ABNORMAL HIGH (ref 6–20)
CO2: 23 mmol/L (ref 22–32)
Calcium: 7.2 mg/dL — ABNORMAL LOW (ref 8.9–10.3)
Chloride: 96 mmol/L — ABNORMAL LOW (ref 101–111)
Creatinine, Ser: 11.27 mg/dL — ABNORMAL HIGH (ref 0.61–1.24)
GFR calc non Af Amer: 5 mL/min — ABNORMAL LOW (ref >60)
GFR, EST AFRICAN AMERICAN: 6 mL/min — AB (ref >60)
Glucose, Bld: 171 mg/dL — ABNORMAL HIGH (ref 65–99)
Potassium: 3.5 mmol/L (ref 3.5–5.1)
SODIUM: 139 mmol/L (ref 135–145)

## 2016-11-06 LAB — PHOSPHORUS: PHOSPHORUS: 7.6 mg/dL — AB (ref 2.5–4.6)

## 2016-11-06 LAB — MAGNESIUM: MAGNESIUM: 2 mg/dL (ref 1.7–2.4)

## 2016-11-06 LAB — VITAMIN B1: VITAMIN B1 (THIAMINE): 119.8 nmol/L (ref 66.5–200.0)

## 2016-11-06 MED ORDER — LORAZEPAM 2 MG/ML IJ SOLN
1.0000 mg | INTRAMUSCULAR | Status: DC | PRN
  Administered 2016-11-06 (×2): 1 mg via INTRAVENOUS
  Filled 2016-11-06 (×2): qty 1

## 2016-11-06 MED ORDER — BUDESONIDE 0.5 MG/2ML IN SUSP
0.5000 mg | Freq: Two times a day (BID) | RESPIRATORY_TRACT | Status: DC
  Administered 2016-11-06 – 2016-11-20 (×23): 0.5 mg via RESPIRATORY_TRACT
  Filled 2016-11-06 (×30): qty 2

## 2016-11-06 MED ORDER — DEXMEDETOMIDINE HCL IN NACL 400 MCG/100ML IV SOLN
0.4000 ug/kg/h | INTRAVENOUS | Status: DC
  Administered 2016-11-06 (×2): 1 ug/kg/h via INTRAVENOUS
  Administered 2016-11-06: 0.7 ug/kg/h via INTRAVENOUS
  Administered 2016-11-07: 1 ug/kg/h via INTRAVENOUS
  Administered 2016-11-07 – 2016-11-08 (×5): 1.2 ug/kg/h via INTRAVENOUS
  Filled 2016-11-06 (×15): qty 100

## 2016-11-06 MED ORDER — INSULIN ASPART 100 UNIT/ML ~~LOC~~ SOLN
0.0000 [IU] | SUBCUTANEOUS | Status: DC
  Administered 2016-11-06: 1 [IU] via SUBCUTANEOUS
  Administered 2016-11-06 (×2): 3 [IU] via SUBCUTANEOUS
  Administered 2016-11-07: 1 [IU] via SUBCUTANEOUS
  Administered 2016-11-07: 2 [IU] via SUBCUTANEOUS
  Administered 2016-11-07 (×2): 1 [IU] via SUBCUTANEOUS
  Administered 2016-11-07: 2 [IU] via SUBCUTANEOUS
  Administered 2016-11-08 (×3): 1 [IU] via SUBCUTANEOUS
  Administered 2016-11-09: 2 [IU] via SUBCUTANEOUS
  Administered 2016-11-11 – 2016-11-15 (×2): 3 [IU] via SUBCUTANEOUS

## 2016-11-06 NOTE — Progress Notes (Signed)
VASCULAR LAB PRELIMINARY  ARTERIAL  ABI completed: Technically limited study due to patient movement, and cooperation. Right ABI of 1.13 is suggestive of arterial flow within normal limits at rest however, monophasic waveforms were noted in the posterior tibial and dorsalis pedis arteries. Unable to accurately obtain left ABI due to non-compressible arteries likely due to medial calcification.   RIGHT    LEFT    PRESSURE WAVEFORM  PRESSURE WAVEFORM  BRACHIAL 142 Triphasic BRACHIAL HD Access   DP  Monophasic DP >254 Monophasic  PT 161 Monophasic PT >254 Monophasic    RIGHT LEFT  ABI 1.13      Legrand Como, RVT 11/06/2016, 12:18 PM

## 2016-11-06 NOTE — Progress Notes (Signed)
Patient placed on CPAP/PSV 5/5 at 0837.  Currently tolerating well.  Will continue to monitor.

## 2016-11-06 NOTE — Progress Notes (Signed)
[facility] Progress Note Patient Name: Hayden [name] DOB: [birth year] MRN: [id number]   Date of Service  11/06/2016  HPI/Events of Note  Agitation  On precedex 1.2 Sign out recs from primary pccm Change klon to ativan Done  May need to increase precedex further  eICU Interventions       Intervention Category Major Interventions: Delirium, psychosis, severe agitation - evaluation and management  [name] J. 11/06/2016, 3:58 PM

## 2016-11-06 NOTE — Procedures (Signed)
Extubation Procedure Note  Patient Details:   Name: Hayden [name] DOB: [birth year] MRN: [id number]   Airway Documentation:     Evaluation  O2 sats: stable throughout Complications: No apparent complications Patient did tolerate procedure well. Bilateral Breath Sounds: Clear   Yes   Patient was extubated to a 4L [place]. Cuff leak was heard. No stridor was noted. RN at bedside with RT during extubation. Patient was able to speak afterwards. RT attempted IS with patient and he was able to perform a couple times but still too sleepy at this time.   Tiburcio Bash 11/06/2016, 9:54 AM

## 2016-11-06 NOTE — Progress Notes (Signed)
Inpatient Rehabilitation  PT has evaluated pt. and is recommending IP Rehab.  Patient was screened by Gerlean Ren for appropriateness for an Inpatient Acute Rehab consult.  At this time, we are recommending Inpatient Rehab consult.  Please order consult if you are agreeable.  [facility] Admissions Coordinator Cell [phone] Office [phone]

## 2016-11-06 NOTE — Progress Notes (Signed)
[facility] Progress Note   Subjective:  precedex gtt off, remains intubated.  Hopefully will extubate today.  Objective Vitals:   11/06/16 0643 11/06/16 0644 11/06/16 0824 11/06/16 0839  BP: 105/64   112/64  Pulse:  60    Resp:  18  15  Temp:   (!) 96.9 F (36.1 C)   TempSrc:   Axillary   SpO2:  100%    Weight:      Height:       Physical Exam General: intubated, can respond to some questions Heart: RRR; no murmur Lungs: coarse mechanical sounds bilaterally but no crackles Abdomen: soft, non-tender. Extremities: No LE edema. R foot with sterile dressing Dialysis Access: PD cath and L forearm AVF + thrill  Additional Objective Labs: Basic Metabolic Panel:  Recent Labs Lab 11/04/16 0508 11/05/16 0504 11/05/16 1510 11/05/16 1704 11/06/16 0529  NA 141 140  --   --  139  K 3.7 3.3*  --   --  3.5  CL 98* 98*  --   --  96*  CO2 26 25  --   --  23  GLUCOSE 89 107*  --   --  171*  BUN 45* 34*  --   --  42*  CREATININE 12.54* 11.36*  --   --  11.27*  CALCIUM 7.9* 7.9*  --   --  7.2*  PHOS 6.7* 6.6* 7.6* 7.0* 7.6*   Liver Function Tests:  Recent Labs Lab 11/04/16 0508 11/04/16 1445 11/05/16 0504  AST  --  22  --   ALT  --  24  --   ALKPHOS  --  219*  --   BILITOT  --  0.6  --   PROT  --  6.0*  --   ALBUMIN 2.1* 2.2* 2.0*   CBC:  Recent Labs Lab 10/31/16 0540 11/01/16 0425 11/03/16 0412 11/05/16 0504 11/06/16 0529  WBC 11.1* 13.2* 14.0* 15.5* 17.2*  NEUTROABS 6.5 9.0*  --   --   --   HGB 8.9* 9.5* 10.2* 10.7* 11.0*  HCT 29.2* 30.3* 32.7* 34.1* 35.6*  MCV 79.3 79.5 79.4 80.2 80.0  PLT 495* 520* 543* 535* 465*   CBG:  Recent Labs Lab 11/05/16 1620 11/05/16 1931 11/05/16 2335 11/06/16 0320 11/06/16 0822  GLUCAP 84 148* 240* 210* 98   Medications: . sodium chloride Stopped (11/05/16 1412)  . sodium chloride    . dexmedetomidine (PRECEDEX) IV infusion 0.5 mcg/kg/hr (11/06/16 0800)  . dialysis solution 1.5% low-MG/low-CA     . piperacillin-tazobactam (ZOSYN)  IV Stopped (11/05/16 2329)  . [START ON 11/09/2016] vancomycin     . amLODipine  5 mg Per Tube Daily  . aspirin  81 mg Per Tube Daily  . budesonide (PULMICORT) nebulizer solution  0.5 mg Nebulization BID  . buPROPion  150 mg Oral Daily   And  . buPROPion  300 mg Oral QHS  . chlorhexidine gluconate (MEDLINE KIT)  15 mL Mouth Rinse BID  . clonazePAM  1 mg Per Tube BID  . darbepoetin (ARANESP) injection - NON-DIALYSIS  100 mcg Subcutaneous Q Sun-1800  . docusate  100 mg Per Tube Daily  . feeding supplement (NEPRO CARB STEADY)  237 mL Oral BID BM  . feeding supplement (PRO-STAT SUGAR FREE 64)  30 mL Per Tube BID  . feeding supplement (VITAL HIGH PROTEIN)  1,000 mL Per Tube Q24H  . gentamicin cream  1 application Topical Daily  . heparin  5,000 Units Subcutaneous Q8H  .  insulin aspart  0-9 Units Subcutaneous Q4H  . isosorbide dinitrate  5 mg Per Tube BID  . labetalol  200 mg Per Tube BID  . lactobacillus  1 g Per Tube TID WC  . mouth rinse  15 mL Mouth Rinse QID  . pantoprazole  40 mg Oral Daily  . pravastatin  20 mg Per Tube Daily  . sodium chloride flush  3 mL Intravenous Q12H  . Valproate Sodium  500 mg Per Tube BID    Dialysis Orders: [facility].  CCPD: 3L fill 5 exchanges at night with 5th fill manually drained 1-1/2 hours later. Uses mostly 1's and 2's at home.   Assessment/Plan: 1. Ischemic/gangrenous R great toe/Severe PAD: s/p OR with VVS Continue Vanc/Zosyn (next Vanc level ordered for 5/8).  2. ESRD: Continue CPPD; 1.5% solution- weights are inaccurate 3. Anemia: Hgb 10.2, continue Aranesp weekly, expect to need upward adjustment in the setting of surgery. 4. Secondary hyperparathyroidism: Phos high, but coming down. Continue binders/VDRA/sensipar. 5. HTN/volume: BP high, does not seem to have volume excess. Continue PD with all 1.5%. Follow BP. 6. Nutrition: Alb 2.1. Continue protein supps. 7. Abnormal myoview stress test: EF  43%. Reversible, medium sized, mild basal to mid inferior and basal inferolateral perfusion defect concerning for ischemia. Per cards, nuc medicine study is inaccurate, no plans for further investigation.   8. Acute encephalopathy: infection vs drugs vs intracranial process? Psych and neuro following.  Plan for extubation today, most mild-altering meds d/c'd  Madelon Lips MD 11/06/2016, 9:43 AM  [facility] Pager: [phone]

## 2016-11-06 NOTE — Evaluation (Signed)
Physical Therapy Evaluation Patient Details Name: Hayden [name] MRN: [id number] DOB: 01/29/1971 Today's Date: 11/06/2016   History of Present Illness  Hayden [name] is a 46 y.o. M with PMH as outlined below including ESRD on peritoneal dialysis. He was admitted 4/25 for dry gangrene and cellulitis on the right foot./Right great toe.  He underwent arteriorgram that showed severe calcification of R SFA system and is now s/p  right CFA to below knee popliteal artery bypass, right first toe amputation, right dorsal ulcer debridement performed on 5/8.  He was extubated morning of 5/9.  Clinical Impression  Patient presents with decreased independence and safety with mobility due to pain, AMS, poor activity tolerance, decreased balance and general weakness.  He will benefit from skilled PT in the acute setting to allow return home with family support following CIR level rehab stay.  No family available today to confirm help at home, but will follow up as would need 24 hour assist at d/c.     Follow Up Recommendations CIR    Equipment Recommendations  Rolling walker with 5" wheels    Recommendations for Other Services       Precautions / Restrictions Precautions Precautions: Fall      Mobility  Bed Mobility Overal bed mobility: Needs Assistance Bed Mobility: Supine to Sit;Sit to Supine;Rolling Rolling: Min assist   Supine to sit: Max assist;+2 for physical assistance;HOB elevated Sit to supine: Mod assist   General bed mobility comments: pt guided for participation in getting up to EOB, assist for legs off bed after he assisted with rolling with rail; participated in to supine with assist for legs  Transfers Overall transfer level: Needs assistance Equipment used: Rolling walker (2 wheeled) Transfers: Sit to/from Stand Sit to Stand: Max assist;+2 physical assistance         General transfer comment: pt leaning posterioron EOB and max lifting and lowering help for safety  needed from EOB, performed x 3 due to pt only aware of pain in R leg when standing and sits quickly back down  Ambulation/Gait             General Gait Details: unable  Stairs            Wheelchair Mobility    Modified Rankin (Stroke Patients Only)       Balance Overall balance assessment: Needs assistance   Sitting balance-Leahy Scale: Poor Sitting balance - Comments: leaning posteriorly and cues for keeping eyes open throughout   Standing balance support: Bilateral upper extremity supported Standing balance-Leahy Scale: Poor Standing balance comment: +2 A for standing briefly                             Pertinent Vitals/Pain Pain Assessment: Faces Faces Pain Scale: Hurts whole lot Pain Location: R LE with movement and attempts at standing Pain Descriptors / Indicators: Discomfort;Grimacing;Guarding Pain Intervention(s): Monitored during session;Repositioned    Home Living Family/patient expects to be discharged to:: Private residence Living Arrangements: Spouse/significant other Available Help at Discharge: Family;Available 24 hours/day Type of Home: Mobile home Home Access: Stairs to enter Entrance Stairs-Rails: Psychiatric nurse of Steps: 4 Home Layout: One level Home Equipment: Cane - single point;Grab bars - tub/shower;Hand held shower head Additional Comments: patient unable to relate prior level/home info, above taken from admission last year.     Prior Function  Hand Dominance        Extremity/Trunk Assessment   Upper Extremity Assessment Upper Extremity Assessment: Generalized weakness    Lower Extremity Assessment Lower Extremity Assessment: RLE deficits/detail RLE Deficits / Details: incision in groin, medial knee and foot wrapped with bandage.  moves well in bed, but pain with standing. moving RLE: Unable to fully assess due to pain    Cervical / Trunk Assessment Cervical / Trunk  Assessment: Kyphotic  Communication   Communication: Other (comment) (mumbling and at times incoherent)  Cognition Arousal/Alertness: Lethargic Behavior During Therapy: Restless Overall Cognitive Status: Impaired/Different from baseline Area of Impairment: Orientation;Following commands;Safety/judgement;Attention                 Orientation Level: Place;Time;Situation Current Attention Level: Sustained   Following Commands: Follows one step commands with increased time Safety/Judgement: Decreased awareness of safety;Decreased awareness of deficits     General Comments: Patient asleep initially yelling of help stating the puppy is drowning; then when re-oriented, no longer anxious, but still mumbling       General Comments      Exercises     Assessment/Plan    PT Assessment Patient needs continued PT services  PT Problem List Decreased strength;Decreased activity tolerance;Decreased balance;Decreased mobility;Decreased safety awareness;Decreased cognition;Pain;Decreased knowledge of use of DME       PT Treatment Interventions DME instruction;Therapeutic activities;Cognitive remediation;Gait training;Therapeutic exercise;Patient/family education;Balance training;Functional mobility training;Stair training    PT Goals (Current goals can be found in the Care Plan section)  Acute Rehab PT Goals Patient Stated Goal: none stated PT Goal Formulation: Patient unable to participate in goal setting Time For Goal Achievement: 11/13/16 Potential to Achieve Goals: Fair    Frequency Min 3X/week   Barriers to discharge        Co-evaluation               AM-PAC PT "6 Clicks" Daily Activity  Outcome Measure Difficulty turning over in bed (including adjusting bedclothes, sheets and blankets)?: Total Difficulty moving from lying on back to sitting on the side of the bed? : Total Difficulty sitting down on and standing up from a chair with arms (e.g., wheelchair, bedside  commode, etc,.)?: Total Help needed moving to and from a bed to chair (including a wheelchair)?: A Lot Help needed walking in hospital room?: Total Help needed climbing 3-5 steps with a railing? : Total 6 Click Score: 7    End of Session Equipment Utilized During Treatment: Gait belt Activity Tolerance: Patient limited by lethargy;Patient limited by pain Patient left: in bed;with call bell/phone within reach Nurse Communication: Other (comment) (requested bed pan and given, but unable to urinate or deficate) PT Visit Diagnosis: Other abnormalities of gait and mobility (R26.89);Muscle weakness (generalized) (M62.81);Unsteadiness on feet (R26.81);Pain Pain - Right/Left: Right Pain - part of body: Leg    Time: 0981-1914 PT Time Calculation (min) (ACUTE ONLY): 20 min   Charges:   PT Evaluation $PT Eval High Complexity: 1 Procedure     PT G CodesMagda Kiel, PT [phone] 11/06/2016   Reginia Naas 11/06/2016, 2:46 PM

## 2016-11-06 NOTE — Progress Notes (Signed)
Initial Nutrition Assessment  INTERVENTION:   Plan to supplement diet once advanced as appropriate.   NUTRITION DIAGNOSIS:   Increased nutrient needs related to wound healing as evidenced by estimated needs.  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   Pt extubated 5/9 RN at bedside. Pt reports having lost 130 lb in > 1 year by trying to lose weight. Pt agitated and demanding to have his bed moved to another room.  Unable to complete Nutrition-Focused physical exam at this time due to patient agitation.  Has PD overnight.   Medications reviewed and include: lactinex Labs reviewed: PO4 7.6 CBG's: 151-139   Diet Order:  Diet clear liquid Room service appropriate? Yes; Fluid consistency: Thin  Skin:  Wound (see comment) (Deep tissue injury R foot, unstageable R foot, incisions)  Last BM:  5/8  Height:   Ht Readings from Last 1 Encounters:  10/25/16 5\' 10"  (1.778 m)    Weight:   Wt Readings from Last 1 Encounters:  11/07/16 186 lb 1.1 oz (84.4 kg)    Ideal Body Weight:  75.4 kg  BMI:  Body mass index is 26.7 kg/m.  Estimated Nutritional Needs:   Kcal:  2200-2400  Protein:  100-120 grams  Fluid:  1.2 L/day  EDUCATION NEEDS:   No education needs identified at this time  [place], [place], [place] Pager [phone] After Hours Pager

## 2016-11-06 NOTE — Progress Notes (Signed)
OT Cancellation Note  Patient Details Name: Hayden [name] MRN: [id number] DOB: Oct 18, 1970   Cancelled Treatment:    Reason Eval/Treat Not Completed: Patient not medically ready. Pt is intubated and sedated. Will follow.  Malka So 11/06/2016, 8:36 AM  [phone]

## 2016-11-06 NOTE — Progress Notes (Signed)
Subjective: Still sedated but much improved. Now able to answer questions and follow commands.   Exam: Vitals:   11/06/16 0824 11/06/16 0839  BP:  112/64  Pulse:    Resp:  15  Temp: (!) 96.9 F (36.1 C)     HEENT-  Normocephalic, no lesions, without obvious abnormality.  Normal external eye and conjunctiva.  Normal TM's bilaterally.  Normal auditory canals and external ears. Normal external nose, mucus membranes and septum.  Normal pharynx.    Neuro:  CN: Pupils are equal and round. They are symmetrically reactive from 3-->2 mm. EOMI without nystagmus. Facial sensation is intact to light touch. Face is symmetric at rest with normal strength and mobility. Hearing is intact to conversational voice. Palate elevates symmetrically and uvula is midline. Voice is normal in tone, pitch and quality. Bilateral SCM and trapezii are 5/5. Tongue is midline with normal bulk and mobility.  Motor: Normal bulk, tone, and strength. 5/5 throughout UE and 4/5 in LE  Sensation: Intact to light touch.      Pertinent Labs/Diagnostics: EEG  Sedation: Precedex, lorazepam 6mg  during the  given night prior.  Technique: This is a 21 channel routine scalp EEG performed at the bedside with bipolar and monopolar montages arranged in accordance to the international 10/20 system of electrode placement. One channel was dedicated to EKG recording.    Background: There is an excess of frontocentrally predominant beta activity as well as irregular delta and theta activities. Following stimulation, there is a PDR of 9 Hz that attenuates with eye opening. There are sleep spindles and k-complexes seen.   Photic stimulation: Physiologic driving is not performed  EEG Abnormalities: 1) Generalized irregular slow activity.   Clinical Interpretation: This EEG is consistent with a generalized non-specific cerebral dysfunction(encephalopathy). There was no seizure or seizure predisposition recorded on this study.  Please note that a normal EEG does not preclude the possibility of epilepsy.     Impression: likely toxic metabolic encephalopathy. Much improved. EEG shows no epileptiform activity. At this time may hold off on MRI. Neurology will S/O    Etta Quill PA-C [facility] [phone]   11/06/2016, 10:05 AM

## 2016-11-06 NOTE — Progress Notes (Signed)
[facility] Progress Note Patient Name: Hayden [name] DOB: 1971/05/08 MRN: [id number]   Date of Service  11/06/2016  HPI/Events of Note  Hyperglycemia - Blood glucose = 240.  eICU Interventions  Will order: 1. Q 4 hour sensitive Novolog SSI.     Intervention Category Major Interventions: Hyperglycemia - active titration of insulin therapy  Sommer,Steven Eugene 11/06/2016, 12:30 AM

## 2016-11-06 NOTE — Care Management Note (Signed)
Case Management Note Marvetta Gibbons RN, BSN Unit 2W-Case Manager [phone]  Patient Details  Name: NORMA IGNASIAK MRN: [id number] Date of Birth: [birth year]  Subjective/Objective:   Pt admitted with necrotic toe, hx ESRD on PD at home. s/p Rightcommon femoral artery to below-the-knee popliteal artery bypass with non-reversed ipsilateral greater saphenous vein  2. Right first ray amputation 3. Right dorsal ulcer debridement -- on 11/05/16--- post op remains on Vent as of 5/9                 Action/Plan: PTA pt lived at home with wife- CM will follow for d/c needs- PT/OT evals pending-   Expected Discharge Date:                  Expected Discharge Plan:  [place]  In-House Referral:     Discharge planning Services  CM Consult  Post Acute Care Choice:    Choice offered to:     DME Arranged:    DME Agency:     HH Arranged:    [place] Agency:     Status of Service:  In process, will continue to follow  If discussed at Long Length of Stay Meetings, dates discussed:    Discharge Disposition:   Additional Comments:  Dawayne Patricia, RN 11/06/2016, 11:46 AM

## 2016-11-06 NOTE — Progress Notes (Signed)
PT Cancellation Note  Patient Details Name: Hayden [name] MRN: [id number] DOB: February 05, 1971   Cancelled Treatment:    Reason Eval/Treat Not Completed: Patient not medically ready; patient remains on vent and sedated.  Will attempt another day.   Reginia Naas 11/06/2016, 9:04 AM  [name], [name] 11/06/2016

## 2016-11-06 NOTE — Consult Note (Signed)
PULMONARY / CRITICAL CARE MEDICINE   Name: Hayden [name] MRN: [id number] DOB: [birth year]    ADMISSION DATE:  10/23/2016 CONSULTATION DATE:  11/05/16  REFERRING MD:  Bridgett Larsson  CHIEF COMPLAINT:  Vent Management Post Op  HISTORY OF PRESENT ILLNESS:  Pt is encephelopathic; therefore, this HPI is obtained from chart review. Hayden [name] is a 46 y.o. M with PMH as outlined below including ESRD on peritoneal dialysis. He was admitted 4/25 for dry gangrene and cellulitis on the right foot./Right great toe.  He had been seen in the ED roughly 1 week prior to this was treated on vancomycin but ended up leaving AMA.  He was treated with empiric antibiotics but unfortunately failed conservative management.  Vascular surgery was consulted and arteriogram showed severe calcification of his entire arterial system with R SFA occlusion.  He was taken to the OR 5/8 for right CFA to below knee popliteal artery bypass, right first toe amputation, right dorsal ulcer debridement. Following the procedure, he returned to the ICU and remained on the ventilator. PCCM was called for management while in ICU and for possible extubation.  Of note, 5/7 patient was extremely agitated and combative and had altered mental status. Neurology was consulted and felt that etiology was likely metabolic versus questionable withdrawal. CT head and EEG were ordered, but as of 5/8, these have not been performed.   SUBJECTIVE:   No issues overnight. Had PD overnight.  Did well on PST.  Comfortable. Followed commands.  Extubated this am and doing well.    VITAL SIGNS: BP 129/63   Pulse 62   Temp 97.8 F (36.6 C) (Axillary)   Resp 16   Ht 5\' 10"  (1.778 m)   Wt 84.4 kg (186 lb 1.1 oz)   SpO2 100%   BMI 26.70 kg/m   HEMODYNAMICS:    VENTILATOR SETTINGS: Vent Mode: PSV;CPAP FiO2 (%):  [30 %-40 %] 30 % Set Rate:  [16 bmp] 16 bmp Vt Set:  [580 mL] 580 mL PEEP:  [5 cmH20] 5 cmH20 Pressure Support:  [5 cmH20] 5  cmH20 Plateau Pressure:  [17 cmH20-19 cmH20] 19 cmH20  INTAKE / OUTPUT: I/O last 3 completed shifts: In: 31438.3 [I.V.:841.9; VQQVZ:56387; NG/GT:583.3; IV Piggyback:50] Out: 56433 [IRJJO:84166; Blood:300]  PHYSICAL EXAMINATION: General:  Middle aged male, chronically ill appearing. Neuro:  CN grossly intact. (-) lateralizing signs. Follows commands.  HEENT:  [place]/AT. MMM. Cardiovascular:  RRR, no M/R/G. Lungs:  Clear bilaterally.  ETT in place. Abdomen:  BS x 4, S/NT/ND. Musculoskeletal:  Right great toe amputation with dressings C/D/I.  Incisions to RLE noted with dressings C/D/I. No edema. Skin:  Warm, dry.  LABS:  BMET  Recent Labs Lab 11/04/16 0508 11/05/16 0504 11/06/16 0529  NA 141 140 139  K 3.7 3.3* 3.5  CL 98* 98* 96*  CO2 26 25 23   BUN 45* 34* 42*  CREATININE 12.54* 11.36* 11.27*  GLUCOSE 89 107* 171*    Electrolytes  Recent Labs Lab 11/04/16 0508 11/05/16 0504 11/05/16 1510 11/05/16 1704 11/06/16 0529  CALCIUM 7.9* 7.9*  --   --  7.2*  MG  --   --  1.9 2.0 2.0  PHOS 6.7* 6.6* 7.6* 7.0* 7.6*    CBC  Recent Labs Lab 11/03/16 0412 11/05/16 0504 11/06/16 0529  WBC 14.0* 15.5* 17.2*  HGB 10.2* 10.7* 11.0*  HCT 32.7* 34.1* 35.6*  PLT 543* 535* 465*    Coag's No results for input(s): APTT, INR in the last 168 hours.  Sepsis Markers No results for input(s): LATICACIDVEN, PROCALCITON, O2SATVEN in the last 168 hours.  ABG  Recent Labs Lab 11/05/16 1505  PHART 7.457*  PCO2ART 32.6  PO2ART 143*    Liver Enzymes  Recent Labs Lab 11/04/16 0508 11/04/16 1445 11/05/16 0504  AST  --  22  --   ALT  --  24  --   ALKPHOS  --  219*  --   BILITOT  --  0.6  --   ALBUMIN 2.1* 2.2* 2.0*    Cardiac Enzymes  Recent Labs Lab 11/05/16 1510  TROPONINI 0.08*    Glucose  Recent Labs Lab 11/05/16 1620 11/05/16 1931 11/05/16 2335 11/06/16 0320 11/06/16 0822 11/06/16 1132  GLUCAP 84 148* 240* 210* 98 102*    Imaging Dg Chest  Port 1 View  Result Date: 11/06/2016 CLINICAL DATA:  Respiratory failure.  Endotracheal tube present. EXAM: PORTABLE CHEST 1 VIEW COMPARISON:  11/05/2016 FINDINGS: Endotracheal tube is 5.2 cm above the carina. Nasogastric tube extends into the abdomen. Low lung volumes without focal airspace disease. Negative for a pneumothorax. Heart size is within normal limits. Atherosclerotic calcifications at the aortic arch. IMPRESSION: Low lung volumes without focal lung disease. Support apparatuses as described. Electronically Signed   By: Markus Daft M.D.   On: 11/06/2016 07:42   Dg Chest Port 1 View  Result Date: 11/05/2016 CLINICAL DATA:  Hypoxia EXAM: PORTABLE CHEST 1 VIEW COMPARISON:  December 29, 2015 FINDINGS: Endotracheal tube tip is 2.1 cm above the carina. No pneumothorax. There is no edema or consolidation. Heart is upper normal in size with pulmonary vascularity within normal limits. No adenopathy. There is aortic atherosclerosis. No bone lesions. IMPRESSION: Endotracheal tube as described without evident pneumothorax. No edema or consolidation. There is aortic atherosclerosis. Electronically Signed   By: Lowella Grip III M.D.   On: 11/05/2016 15:44   Dg Abd Portable 1v  Result Date: 11/05/2016 CLINICAL DATA:  Encounter for orogastric tube placement. EXAM: PORTABLE ABDOMEN - 1 VIEW COMPARISON:  11/15/2015 FINDINGS: There is gas within the stomach. Orogastric tube is within the stomach body region. Nonobstructive bowel gas pattern. There is a peritoneal dialysis catheter which is partially visualized. IMPRESSION: Orogastric tube in the stomach. Electronically Signed   By: Markus Daft M.D.   On: 11/05/2016 16:02     STUDIES:  Abdominal Aortogram 4/30 > no significant renal artery stenosis identified, no significant inflow disease, diffuse calcific disease throughout the infrainguinal arteries, occlusion of the distal superficial femoral artery and above-knee popliteal artery over a length of  approximately 15 cm, anterior tibial artery on the right is occluded, the dominant runoff is the peroneal artery which is diseased distally, posterior tibial artery has severe diffuse disease.  NM Stress Test 5/3 > EF 43%, inferior hypokinesis, reversible, medium sized, mild basal to mid inferior and basal inferolateral perfusion defect, concerning for possible ischemia, intermediate risk study.   ECHO 5/4 > LVEF 60-65%, compared to prior study in 2016, there is now severe LVH, severe concentric hypertrophy, no regional WMA, grade II diastolic dysfunction, moderate LAE, mild mitral stenosis and mild pulmonary hypertension with RSVP of 35 CT Head 5/7 >  EEG 5/7 >      CULTURES: BCx2 4/25 > negative   ANTIBIOTICS: Zosyn 4/25 >  Vancomycin 4/25 x1, 5/2 >  SIGNIFICANT EVENTS: 4/25  Admit  5/07  Seen by Neurology & Psychiatry  5/08  To OR for right femoropopliteal vs femorotibial bypass & right great toe amputation  LINES/TUBES: ETT 5/8 >>   DISCUSSION: 46 y/o M with PMH of former DM (resolved with weight loss), ESRD on PD, renal cancer s/p nephrectomy, HTN/CHF and PVD admitted on 4/25 with right foot redness and swelling after an injury approximately one month ago.  He was found to have RLE gangrene and BLE PAD.  Hospital course complicated by acute encephalopathy. Taken to OR 5/8 for right CFA to below knee popliteal artery bypass, right first toe amputation, right dorsal ulcer debridement.  ASSESSMENT / PLAN:  PULMONARY A: Acute Respiratory Insufficiency - post operative amputation, RLE bypass.  P:   PRVC 8 cc/kg >> did well on PST on 5/9 and extubated.  Incentive spirometry   CARDIOVASCULAR A:  PVD s/p RLE bypass and R great toe amputation  Hx HTN, HLD P:  Continue norvasc, ASA, clonidine patch, isordil, labetalol  Off hydralazine as BP was soft with intubation Monitor hemodynamics in ICU  PRN hydralazine for SBP >180   RENAL A:   CKD on Peritoneal Dialysis   AVF Placement - completed 12/2012 Hypokalemia  P:   Trend BMP  Replace electrolytes as indicated  Nightly PD   GASTROINTESTINAL A:   Protein Calorie Malnutrition  Hx Obesity  P:   NPO / OGT.  If he continues to do well post extubation, advance diet as tolerated   HEMATOLOGIC A:   Anemia - suspect of chronic disease & acute illness  P:  Trend CBC  Heparin SQ for DVT prophylaxis   INFECTIOUS A:   RLE Gangrene s/p R Great Toe Amputation - blood cultures negative on admit  P:   Monitor fever curve / WBC Cont Vanc and Zosyn for now.  Will deescalate in am.    ENDOCRINE A:   Prior DM - reportedly none since weight loss, 130lb weight loss P:   Monitor glucose on BMP    NEUROLOGIC A:   Acute Encephalopathy - suspect multifactorial in the setting of possible infection / gangrene with RLE, benzodiazepines / narcotics and poor renal clearance.  Hallucination / Anxiety / Depression  ? Seizure - noted 5/6-5/7 but not witnessed, concern for post ictal state with AMS Hx CVA P:   RASS goal: 0 to -1 Cont Clonazepam and Wellbutrin and depakene.  Ativan prn on hold for now as he is on clonazepam Wean off precedex PRN fentanyl for pain  Neurology following, appreciate input  EEG pending  If with worse agitation overnight, consider re starting ativan prn and placing on CIWA for possible ETOH withdrawal   FAMILY  - Updates: No family at bedside.    - Inter-disciplinary family meet or Palliative Care meeting due by: 5/15  CC time: 35 min.  Monica Becton, MD 11/06/2016, 12:19 PM [facility] Pager [phone] After 3 pm or if no answer, call [phone]

## 2016-11-06 NOTE — Progress Notes (Signed)
PULMONARY / CRITICAL CARE MEDICINE   Name: Hayden [name] MRN: [id number] DOB: [birth year]    ADMISSION DATE:  10/23/2016 CONSULTATION DATE:  11/05/16  REFERRING MD:  Bridgett Larsson  CHIEF COMPLAINT:  Vent Management Post Op  HISTORY OF PRESENT ILLNESS:  Pt is encephelopathic; therefore, this HPI is obtained from chart review. Hayden [name] is a 46 y.o. M with PMH as outlined below including ESRD on peritoneal dialysis. He was admitted 4/25 for dry gangrene and cellulitis on the right foot./Right great toe.  He had been seen in the ED roughly 1 week prior to this was treated on vancomycin but ended up leaving AMA.  He was treated with empiric antibiotics but unfortunately failed conservative management.  Vascular surgery was consulted and arteriogram showed severe calcification of his entire arterial system with R SFA occlusion.  He was taken to the OR 5/8 for right CFA to below knee popliteal artery bypass, right first toe amputation, right dorsal ulcer debridement. Following the procedure, he returned to the ICU and remained on the ventilator. PCCM was called for management while in ICU and for possible extubation.  Of note, 5/7 patient was extremely agitated and combative and had altered mental status. Neurology was consulted and felt that etiology was likely metabolic versus questionable withdrawal. CT head and EEG were ordered, but as of 5/8, these have not been performed.   SUBJECTIVE:   No issues overnight. Had PD overnight.  Did well on PST.  Comfortable. Followed commands.  Extubated this am and doing well.    VITAL SIGNS: BP 129/63   Pulse 62   Temp 97.8 F (36.6 C) (Axillary)   Resp 16   Ht 5\' 10"  (1.778 m)   Wt 84.4 kg (186 lb 1.1 oz)   SpO2 100%   BMI 26.70 kg/m   HEMODYNAMICS:    VENTILATOR SETTINGS: Vent Mode: PSV;CPAP FiO2 (%):  [30 %-40 %] 30 % Set Rate:  [16 bmp] 16 bmp Vt Set:  [580 mL] 580 mL PEEP:  [5 cmH20] 5 cmH20 Pressure Support:  [5 cmH20] 5  cmH20 Plateau Pressure:  [17 cmH20-19 cmH20] 19 cmH20  INTAKE / OUTPUT: I/O last 3 completed shifts: In: 31438.3 [I.V.:841.9; OJJKK:93818; NG/GT:583.3; IV Piggyback:50] Out: 29937 [JIRCV:89381; Blood:300]  PHYSICAL EXAMINATION: General:  Middle aged male, chronically ill appearing. Neuro:  CN grossly intact. (-) lateralizing signs. Follows commands.  HEENT:  [place]/AT. MMM. Cardiovascular:  RRR, no M/R/G. Lungs:  Clear bilaterally.  ETT in place. Abdomen:  BS x 4, S/NT/ND. Musculoskeletal:  Right great toe amputation with dressings C/D/I.  Incisions to RLE noted with dressings C/D/I. No edema. Skin:  Warm, dry.  LABS:  BMET  Recent Labs Lab 11/04/16 0508 11/05/16 0504 11/06/16 0529  NA 141 140 139  K 3.7 3.3* 3.5  CL 98* 98* 96*  CO2 26 25 23   BUN 45* 34* 42*  CREATININE 12.54* 11.36* 11.27*  GLUCOSE 89 107* 171*    Electrolytes  Recent Labs Lab 11/04/16 0508 11/05/16 0504 11/05/16 1510 11/05/16 1704 11/06/16 0529  CALCIUM 7.9* 7.9*  --   --  7.2*  MG  --   --  1.9 2.0 2.0  PHOS 6.7* 6.6* 7.6* 7.0* 7.6*    CBC  Recent Labs Lab 11/03/16 0412 11/05/16 0504 11/06/16 0529  WBC 14.0* 15.5* 17.2*  HGB 10.2* 10.7* 11.0*  HCT 32.7* 34.1* 35.6*  PLT 543* 535* 465*    Coag's No results for input(s): APTT, INR in the last 168 hours.  Sepsis Markers No results for input(s): LATICACIDVEN, PROCALCITON, O2SATVEN in the last 168 hours.  ABG  Recent Labs Lab 11/05/16 1505  PHART 7.457*  PCO2ART 32.6  PO2ART 143*    Liver Enzymes  Recent Labs Lab 11/04/16 0508 11/04/16 1445 11/05/16 0504  AST  --  22  --   ALT  --  24  --   ALKPHOS  --  219*  --   BILITOT  --  0.6  --   ALBUMIN 2.1* 2.2* 2.0*    Cardiac Enzymes  Recent Labs Lab 11/05/16 1510  TROPONINI 0.08*    Glucose  Recent Labs Lab 11/05/16 1620 11/05/16 1931 11/05/16 2335 11/06/16 0320 11/06/16 0822 11/06/16 1132  GLUCAP 84 148* 240* 210* 98 102*    Imaging Dg Chest  Port 1 View  Result Date: 11/06/2016 CLINICAL DATA:  Respiratory failure.  Endotracheal tube present. EXAM: PORTABLE CHEST 1 VIEW COMPARISON:  11/05/2016 FINDINGS: Endotracheal tube is 5.2 cm above the carina. Nasogastric tube extends into the abdomen. Low lung volumes without focal airspace disease. Negative for a pneumothorax. Heart size is within normal limits. Atherosclerotic calcifications at the aortic arch. IMPRESSION: Low lung volumes without focal lung disease. Support apparatuses as described. Electronically Signed   By: Markus Daft M.D.   On: 11/06/2016 07:42   Dg Chest Port 1 View  Result Date: 11/05/2016 CLINICAL DATA:  Hypoxia EXAM: PORTABLE CHEST 1 VIEW COMPARISON:  December 29, 2015 FINDINGS: Endotracheal tube tip is 2.1 cm above the carina. No pneumothorax. There is no edema or consolidation. Heart is upper normal in size with pulmonary vascularity within normal limits. No adenopathy. There is aortic atherosclerosis. No bone lesions. IMPRESSION: Endotracheal tube as described without evident pneumothorax. No edema or consolidation. There is aortic atherosclerosis. Electronically Signed   By: Lowella Grip III M.D.   On: 11/05/2016 15:44   Dg Abd Portable 1v  Result Date: 11/05/2016 CLINICAL DATA:  Encounter for orogastric tube placement. EXAM: PORTABLE ABDOMEN - 1 VIEW COMPARISON:  11/15/2015 FINDINGS: There is gas within the stomach. Orogastric tube is within the stomach body region. Nonobstructive bowel gas pattern. There is a peritoneal dialysis catheter which is partially visualized. IMPRESSION: Orogastric tube in the stomach. Electronically Signed   By: Markus Daft M.D.   On: 11/05/2016 16:02     STUDIES:  Abdominal Aortogram 4/30 > no significant renal artery stenosis identified, no significant inflow disease, diffuse calcific disease throughout the infrainguinal arteries, occlusion of the distal superficial femoral artery and above-knee popliteal artery over a length of  approximately 15 cm, anterior tibial artery on the right is occluded, the dominant runoff is the peroneal artery which is diseased distally, posterior tibial artery has severe diffuse disease.  NM Stress Test 5/3 > EF 43%, inferior hypokinesis, reversible, medium sized, mild basal to mid inferior and basal inferolateral perfusion defect, concerning for possible ischemia, intermediate risk study.   ECHO 5/4 > LVEF 60-65%, compared to prior study in 2016, there is now severe LVH, severe concentric hypertrophy, no regional WMA, grade II diastolic dysfunction, moderate LAE, mild mitral stenosis and mild pulmonary hypertension with RSVP of 35 CT Head 5/7 >  EEG 5/7 >      CULTURES: BCx2 4/25 > negative   ANTIBIOTICS: Zosyn 4/25 >  Vancomycin 4/25 x1, 5/2 >  SIGNIFICANT EVENTS: 4/25  Admit  5/07  Seen by Neurology & Psychiatry  5/08  To OR for right femoropopliteal vs femorotibial bypass & right great toe amputation  LINES/TUBES: ETT 5/8 >>   DISCUSSION: 46 y/o M with PMH of former DM (resolved with weight loss), ESRD on PD, renal cancer s/p nephrectomy, HTN/CHF and PVD admitted on 4/25 with right foot redness and swelling after an injury approximately one month ago.  He was found to have RLE gangrene and BLE PAD.  Hospital course complicated by acute encephalopathy. Taken to OR 5/8 for right CFA to below knee popliteal artery bypass, right first toe amputation, right dorsal ulcer debridement.  ASSESSMENT / PLAN:  PULMONARY A: Acute Respiratory Insufficiency - post operative amputation, RLE bypass.  P:   PRVC 8 cc/kg >> did well on PST on 5/9 and extubated.  Incentive spirometry   CARDIOVASCULAR A:  PVD s/p RLE bypass and R great toe amputation  Hx HTN, HLD P:  Continue norvasc, ASA, clonidine patch, isordil, labetalol  Off hydralazine as BP was soft with intubation Monitor hemodynamics in ICU  PRN hydralazine for SBP >180   RENAL A:   CKD on Peritoneal Dialysis   AVF Placement - completed 12/2012 Hypokalemia  P:   Trend BMP  Replace electrolytes as indicated  Nightly PD   GASTROINTESTINAL A:   Protein Calorie Malnutrition  Hx Obesity  P:   NPO / OGT.  If he continues to do well post extubation, advance diet as tolerated   HEMATOLOGIC A:   Anemia - suspect of chronic disease & acute illness  P:  Trend CBC  Heparin SQ for DVT prophylaxis   INFECTIOUS A:   RLE Gangrene s/p R Great Toe Amputation - blood cultures negative on admit  P:   Monitor fever curve / WBC Cont Vanc and Zosyn for now.  Will deescalate in am.    ENDOCRINE A:   Prior DM - reportedly none since weight loss, 130lb weight loss P:   Monitor glucose on BMP    NEUROLOGIC A:   Acute Encephalopathy - suspect multifactorial in the setting of possible infection / gangrene with RLE, benzodiazepines / narcotics and poor renal clearance.  Hallucination / Anxiety / Depression  ? Seizure - noted 5/6-5/7 but not witnessed, concern for post ictal state with AMS Hx CVA P:   RASS goal: 0 to -1 Cont Clonazepam and Wellbutrin and depakene.  Ativan prn on hold for now as he is on clonazepam Wean off precedex PRN fentanyl for pain  Neurology following, appreciate input  EEG pending  If with worse agitation overnight, consider re starting ativan prn and placing on CIWA for possible ETOH withdrawal   FAMILY  - Updates: No family at bedside.    - Inter-disciplinary family meet or Palliative Care meeting due by: 5/15  CC time: 35 min.  Monica Becton, MD 11/06/2016, 12:20 PM [facility] Pager [phone] After 3 pm or if no answer, call [phone]

## 2016-11-06 NOTE — Progress Notes (Addendum)
Progress Note    11/06/2016 7:31 AM 1 Day Post-Op  Subjective:  Intubated and sedated.  Just off PD.  Afebrile HR  60's 72'Z-366'Y systolic 403% .47QQV9  gtts: Dexmedetomidine   Abx: Vancomycin  Zosyn  Vitals:   11/06/16 0643 11/06/16 0644  BP: 105/64   Pulse:  60  Resp:  18  Temp:      Physical Exam: Cardiac:  regular Lungs:  Non labored Incisions:  Bloody drainage from above knee saphenous vein harvest-could not express any bloody drainage from the wound.  Otherwise, incisions are clean and dry.   Extremities:  Brisk AT/PT right. Toe amp site is clean and dry with sutures in tact.  Blister plantar surface of the foot   CBC    Component Value Date/Time   WBC 17.2 (H) 11/06/2016 0529   RBC 4.45 11/06/2016 0529   HGB 11.0 (L) 11/06/2016 0529   HCT 35.6 (L) 11/06/2016 0529   PLT 465 (H) 11/06/2016 0529   MCV 80.0 11/06/2016 0529   MCH 24.7 (L) 11/06/2016 0529   MCHC 30.9 11/06/2016 0529   RDW 20.0 (H) 11/06/2016 0529   LYMPHSABS 0.9 11/01/2016 0425   MONOABS 1.5 (H) 11/01/2016 0425   EOSABS 1.7 (H) 11/01/2016 0425   BASOSABS 0.1 11/01/2016 0425    BMET    Component Value Date/Time   NA 139 11/06/2016 0529   K 3.5 11/06/2016 0529   CL 96 (L) 11/06/2016 0529   CO2 23 11/06/2016 0529   GLUCOSE 171 (H) 11/06/2016 0529   BUN 42 (H) 11/06/2016 0529   CREATININE 11.27 (H) 11/06/2016 0529   CALCIUM 7.2 (L) 11/06/2016 0529   GFRNONAA 5 (L) 11/06/2016 0529   GFRAA 6 (L) 11/06/2016 0529    INR    Component Value Date/Time   INR 1.24 10/30/2016 0614     Intake/Output Summary (Last 24 hours) at 11/06/16 0731 Last data filed at 11/06/16 0600  Gross per 24 hour  Intake          1435.26 ml  Output              300 ml  Net          1135.26 ml     Assessment:  46 y.o. male is s/p:  PROCEDURE: 1.  Right common femoral artery to below-the-knee popliteal artery bypass with non-reversed ipsilateral greater saphenous vein  2.  Right first ray  amputation 3.  Right dorsal ulcer debridement  1 Day Post-Op  Plan: -pt with patent bypass graft with brisk doppler signals right PT/AT -some bloody drainage on saphenous vein harvest site bandage-removed and could not express any blood from the incision.  Toe amp site looks good.  There is a plantar tissue wound/blister.  Continue dressing to foot daily.  Prevalon boots to relieve pressure to help prevent further wounds as he has disease in the left leg as well. -afebrile; leukocytosis slightly worsened this am-could certainly be related to peri-op, but continue to monitor.  Wounds look good.  -wean vent per CCM-appreciate assistance. -DVT prophylaxis:  SQ heparin to be restarted this morning   Leontine Locket, PA-C [facility] [phone] 11/06/2016 7:31 AM  Addendum  I have independently interviewed and examined the patient, and I agree with the physician assistant's findings.  Bypass incisions look ok.  Warm right foot with good doppler signals this AM.  Sedation/vent per PCCM.    - postop ABI while still intubated   Adele Barthel, MD, [facility] [place] Office: [phone] Pager: [phone]  11/06/2016, 8:37 AM

## 2016-11-07 LAB — GLUCOSE, CAPILLARY
GLUCOSE-CAPILLARY: 126 mg/dL — AB (ref 65–99)
GLUCOSE-CAPILLARY: 152 mg/dL — AB (ref 65–99)
Glucose-Capillary: 121 mg/dL — ABNORMAL HIGH (ref 65–99)
Glucose-Capillary: 131 mg/dL — ABNORMAL HIGH (ref 65–99)
Glucose-Capillary: 139 mg/dL — ABNORMAL HIGH (ref 65–99)
Glucose-Capillary: 151 mg/dL — ABNORMAL HIGH (ref 65–99)
Glucose-Capillary: 77 mg/dL (ref 65–99)

## 2016-11-07 LAB — BASIC METABOLIC PANEL
ANION GAP: 16 — AB (ref 5–15)
BUN: 49 mg/dL — AB (ref 6–20)
CHLORIDE: 95 mmol/L — AB (ref 101–111)
CO2: 25 mmol/L (ref 22–32)
Calcium: 7.1 mg/dL — ABNORMAL LOW (ref 8.9–10.3)
Creatinine, Ser: 11.35 mg/dL — ABNORMAL HIGH (ref 0.61–1.24)
GFR calc Af Amer: 6 mL/min — ABNORMAL LOW (ref >60)
GFR, EST NON AFRICAN AMERICAN: 5 mL/min — AB (ref >60)
GLUCOSE: 131 mg/dL — AB (ref 65–99)
POTASSIUM: 3.3 mmol/L — AB (ref 3.5–5.1)
Sodium: 136 mmol/L (ref 135–145)

## 2016-11-07 LAB — CBC
HEMATOCRIT: 30.4 % — AB (ref 39.0–52.0)
HEMOGLOBIN: 9.4 g/dL — AB (ref 13.0–17.0)
MCH: 24.6 pg — AB (ref 26.0–34.0)
MCHC: 30.9 g/dL (ref 30.0–36.0)
MCV: 79.6 fL (ref 78.0–100.0)
PLATELETS: 457 10*3/uL — AB (ref 150–400)
RBC: 3.82 MIL/uL — AB (ref 4.22–5.81)
RDW: 20.3 % — ABNORMAL HIGH (ref 11.5–15.5)
WBC: 18.3 10*3/uL — AB (ref 4.0–10.5)

## 2016-11-07 MED ORDER — LABETALOL HCL 200 MG PO TABS
200.0000 mg | ORAL_TABLET | Freq: Two times a day (BID) | ORAL | Status: DC
  Administered 2016-11-07 – 2016-11-14 (×12): 200 mg via ORAL
  Filled 2016-11-07 (×13): qty 1

## 2016-11-07 MED ORDER — HYDROCODONE-ACETAMINOPHEN 5-325 MG PO TABS
1.0000 | ORAL_TABLET | Freq: Four times a day (QID) | ORAL | Status: DC
  Administered 2016-11-07 – 2016-11-08 (×3): 1 via ORAL
  Filled 2016-11-07 (×3): qty 1

## 2016-11-07 MED ORDER — DIVALPROEX SODIUM 500 MG PO DR TAB
500.0000 mg | DELAYED_RELEASE_TABLET | Freq: Two times a day (BID) | ORAL | Status: DC
  Administered 2016-11-07 – 2016-11-12 (×11): 500 mg via ORAL
  Filled 2016-11-07 (×12): qty 1

## 2016-11-07 MED ORDER — CLONIDINE HCL 0.2 MG/24HR TD PTWK
0.2000 mg | MEDICATED_PATCH | TRANSDERMAL | Status: DC
  Administered 2016-11-07 – 2016-11-14 (×2): 0.2 mg via TRANSDERMAL
  Filled 2016-11-07 (×3): qty 1

## 2016-11-07 MED ORDER — ALPRAZOLAM 0.5 MG PO TABS
1.0000 mg | ORAL_TABLET | Freq: Two times a day (BID) | ORAL | Status: DC
  Administered 2016-11-07 – 2016-11-12 (×11): 1 mg via ORAL
  Filled 2016-11-07 (×12): qty 2

## 2016-11-07 MED ORDER — OXYCODONE-ACETAMINOPHEN 5-325 MG PO TABS
1.0000 | ORAL_TABLET | ORAL | Status: AC
  Administered 2016-11-07: 1 via ORAL
  Filled 2016-11-07: qty 1

## 2016-11-07 MED ORDER — FLORANEX PO PACK
1.0000 g | PACK | Freq: Three times a day (TID) | ORAL | Status: DC
  Filled 2016-11-07: qty 1

## 2016-11-07 MED ORDER — SACCHAROMYCES BOULARDII 250 MG PO CAPS
250.0000 mg | ORAL_CAPSULE | Freq: Two times a day (BID) | ORAL | Status: DC
  Administered 2016-11-08 – 2016-11-20 (×22): 250 mg via ORAL
  Filled 2016-11-07 (×24): qty 1

## 2016-11-07 MED ORDER — ISOSORBIDE DINITRATE 10 MG PO TABS
5.0000 mg | ORAL_TABLET | Freq: Two times a day (BID) | ORAL | Status: DC
  Administered 2016-11-07 – 2016-11-20 (×23): 5 mg via ORAL
  Filled 2016-11-07 (×25): qty 1

## 2016-11-07 NOTE — Progress Notes (Signed)
PT Cancellation Note  Patient Details Name: Hayden [name] MRN: [id number] DOB: 11-08-1970   Cancelled Treatment:    Reason Eval/Treat Not Completed: Medical issues which prohibited therapy (Pt agitated)   Shary Decamp [facility] 11/07/2016, 9:32 AM Suanne Marker PT [phone]

## 2016-11-07 NOTE — Progress Notes (Signed)
Pharmacy Antibiotic Note  Hayden [name] is a 46 y.o. male admitted on 10/23/2016 with right foot wound/cellulitis that failed to respond to outpatient dose of Dalvance 1500mg  IV on 10/10/16 and PO clindamycin. History of ESRD on PD at home. Pharmacy consulted to dose vancomycin.   The patient's last vancomycin random level on 5/7 AM was 17 mcg/ml and therapeutic. Patient was re-dosed with vancomycin at that time since the level is <20 mcg/ml. It is estimated that the patient will likely need another dose on 5/12 AM. However will plan to check a vancomycin random level prior to the dose to ensure levels are adequate for re-dosing.   Vancomycin Doses and Levels: * Doses: 2g IV x1 on 4/25, 500mg  on 5/2, 500 mg ordered on 5/7 * Levels: VR 28 mcg/ml (4/28 @ 0330), 26 mcg/ml (4/29 @ 0500), 22 mcg/ml (5/1 @ 0530), 17 mcg/ml (5/7 @ 0900)  Plan: -Vancomycin 500 mg IV every 5 days starting on 5/12 -Stop zosyn -Will continue to follow HD schedule/duration, culture results, LOT, and antibiotic de-escalation plans   Height: 5\' 10"  (177.8 cm) Weight: 186 lb 1.1 oz (84.4 kg) IBW/kg (Calculated) : 73  Temp (24hrs), Avg:98.1 F (36.7 C), Min:97 F (36.1 C), Max:99.1 F (37.3 C)   Recent Labs Lab 11/01/16 0425  11/03/16 0412 11/04/16 0508 11/04/16 0906 11/05/16 0504 11/06/16 0529 11/07/16 0728  WBC 13.2*  --  14.0*  --   --  15.5* 17.2* 18.3*  CREATININE 12.80*  < > 11.41* 12.54*  --  11.36* 11.27* 11.35*  VANCORANDOM  --   --   --   --  17  --   --   --   < > = values in this interval not displayed.  Estimated Creatinine Clearance: 8.6 mL/min (A) (by C-G formula based on SCr of 11.35 mg/dL (H)).    Allergies  Allergen Reactions  . Dilaudid [Hydromorphone Hcl] Other (See Comments)    ABNORMAL BEHAVIORS "VERBALLY AND PHYSICALLY ABUSIVE" PSYCHOSIS  . Morphine And Related Itching and Other (See Comments)    SYNCOPE    Dalvance 1500mg  IV once 10/10/16 at [facility] Zosyn 4/25 >>5/10 Vancomycin 4/25 >>   4/25 BCx: neg 4/26 MRSA PCR neg  Thank you for allowing pharmacy to be a part of this patient's care.  Myer Peer Grayland Ormond), PharmD  PGY1 Pharmacy Resident Pager: [phone] 11/07/2016 12:51 PM

## 2016-11-07 NOTE — Progress Notes (Signed)
Spoke with [facility], they do not admit patients with Peritoneal Dialysis.

## 2016-11-07 NOTE — Progress Notes (Signed)
[facility] Progress Note   Subjective:  Extubated yesterday, having to keep on precedex gtt due to agitation.  EEG nonspecific.  Reports pain, in mitts, agitated Objective Vitals:   11/07/16 0600 11/07/16 0700 11/07/16 0723 11/07/16 0904  BP: (!) 158/77 (!) 164/72    Pulse: 68 70    Resp: 16 18    Temp:   98.5 F (36.9 C)   TempSrc:   Oral   SpO2: 97% 97%  97%  Weight:      Height:       Physical Exam General: extubated, still agitated, speech is intelligible today Heart: RRR; no murmur Lungs: no crackles Abdomen: soft, non-tender. Extremities: No LE edema. R foot with sterile dressing Dialysis Access: PD cath and L forearm AVF + thrill  Additional Objective Labs: Basic Metabolic Panel:  Recent Labs Lab 11/05/16 0504 11/05/16 1510 11/05/16 1704 11/06/16 0529 11/07/16 0728  NA 140  --   --  139 136  K 3.3*  --   --  3.5 3.3*  CL 98*  --   --  96* 95*  CO2 25  --   --  23 25  GLUCOSE 107*  --   --  171* 131*  BUN 34*  --   --  42* 49*  CREATININE 11.36*  --   --  11.27* 11.35*  CALCIUM 7.9*  --   --  7.2* 7.1*  PHOS 6.6* 7.6* 7.0* 7.6*  --    Liver Function Tests:  Recent Labs Lab 11/04/16 0508 11/04/16 1445 11/05/16 0504  AST  --  22  --   ALT  --  24  --   ALKPHOS  --  219*  --   BILITOT  --  0.6  --   PROT  --  6.0*  --   ALBUMIN 2.1* 2.2* 2.0*   CBC:  Recent Labs Lab 11/01/16 0425 11/03/16 0412 11/05/16 0504 11/06/16 0529 11/07/16 0728  WBC 13.2* 14.0* 15.5* 17.2* 18.3*  NEUTROABS 9.0*  --   --   --   --   HGB 9.5* 10.2* 10.7* 11.0* 9.4*  HCT 30.3* 32.7* 34.1* 35.6* 30.4*  MCV 79.5 79.4 80.2 80.0 79.6  PLT 520* 543* 535* 465* 457*   CBG:  Recent Labs Lab 11/06/16 1540 11/06/16 1925 11/06/16 2353 11/07/16 0357 11/07/16 0722  GLUCAP 96 126* 152* 151* 139*   Medications: . sodium chloride Stopped (11/05/16 1412)  . sodium chloride    . dexmedetomidine (PRECEDEX) IV infusion 1 mcg/kg/hr (11/07/16 0800)  .  dialysis solution 1.5% low-MG/low-CA    . piperacillin-tazobactam (ZOSYN)  IV Stopped (11/06/16 2355)  . [START ON 11/09/2016] vancomycin     . amLODipine  5 mg Per Tube Daily  . aspirin  81 mg Per Tube Daily  . budesonide (PULMICORT) nebulizer solution  0.5 mg Nebulization BID  . buPROPion  150 mg Oral Daily   And  . buPROPion  300 mg Oral QHS  . chlorhexidine gluconate (MEDLINE KIT)  15 mL Mouth Rinse BID  . darbepoetin (ARANESP) injection - NON-DIALYSIS  100 mcg Subcutaneous Q Sun-1800  . docusate  100 mg Per Tube Daily  . feeding supplement (NEPRO CARB STEADY)  237 mL Oral BID BM  . gentamicin cream  1 application Topical Daily  . heparin  5,000 Units Subcutaneous Q8H  . insulin aspart  0-9 Units Subcutaneous Q4H  . isosorbide dinitrate  5 mg Per Tube BID  . labetalol  200 mg Per Tube  BID  . lactobacillus  1 g Per Tube TID WC  . mouth rinse  15 mL Mouth Rinse QID  . pantoprazole  40 mg Oral Daily  . pravastatin  20 mg Per Tube Daily  . sodium chloride flush  3 mL Intravenous Q12H  . Valproate Sodium  500 mg Per Tube BID    Dialysis Orders: [facility].  CCPD: 3L fill 5 exchanges at night with 5th fill manually drained 1-1/2 hours later. Uses mostly 1's and 2's at home.   Assessment/Plan: 1. Ischemic/gangrenous R great toe/Severe PAD: s/p OR with VVS Continue Vanc/Zosyn  2. ESRD: Continue CPPD; 1.5% solution- weights are inaccurate 3. Anemia: Hgb 10.2, continue Aranesp weekly, expect to need upward adjustment in the setting of surgery. 4. Secondary hyperparathyroidism: Phos high, but coming down. Continue binders/VDRA/sensipar--> hasn't been able to take the pills in a couple of days due to agitation. 5. HTN/volume: BP high, does not seem to have volume excess. Continue PD with all 1.5%. Follow BP. 6. Nutrition: Alb 2.1. Continue protein supps. 7. Abnormal myoview stress test: EF 43%. Reversible, medium sized, mild basal to mid inferior and basal inferolateral perfusion  defect concerning for ischemia. Per cards, nuc medicine study is inaccurate, no plans for further investigation.   8. Acute encephalopathy: infection vs drugs vs intracranial process? Psych and neuro following.  He is still agitated but appears to be improving somewhat?  EEG nonspecific, CT/ MRI not done yet  Madelon Lips MD 11/07/2016, 9:25 AM  [facility] Pager: [phone]

## 2016-11-07 NOTE — Progress Notes (Signed)
OT Cancellation Note  Patient Details Name: Hayden [name] MRN: [id number] DOB: [birth year]   Cancelled Treatment:    Reason Eval/Treat Not Completed: Medical issues which prohibited therapy (Pt with agitation this morning, will follow.)  Malka So 11/07/2016, 11:59 AM

## 2016-11-07 NOTE — Progress Notes (Signed)
PULMONARY / CRITICAL CARE MEDICINE   Name: Hayden [name] MRN: [id number] DOB: 1970/07/15    ADMISSION DATE:  10/23/2016 CONSULTATION DATE:  11/05/16  REFERRING MD:  Bridgett Larsson  CHIEF COMPLAINT:  Vent Management Post Op  HISTORY OF PRESENT ILLNESS:  Pt is encephelopathic; therefore, this HPI is obtained from chart review. Hayden [name] is a 46 y.o. M with PMH as outlined below including ESRD on peritoneal dialysis. He was admitted 4/25 for dry gangrene and cellulitis on the right foot./Right great toe.  He had been seen in the ED roughly 1 week prior to this was treated on vancomycin but ended up leaving AMA.  He was treated with empiric antibiotics but unfortunately failed conservative management.  Vascular surgery was consulted and arteriogram showed severe calcification of his entire arterial system with R SFA occlusion.  He was taken to the OR 5/8 for right CFA to below knee popliteal artery bypass, right first toe amputation, right dorsal ulcer debridement. Following the procedure, he returned to the ICU and remained on the ventilator. PCCM was called for management while in ICU and for possible extubation.  Of note, 5/7 patient was extremely agitated and combative and had altered mental status. Neurology was consulted and felt that etiology was likely metabolic versus questionable withdrawal. CT head and EEG were ordered, but as of 5/8, these have not been performed.   SUBJECTIVE:   Agitation overnight 2/2 neuropathy and post op pain.  Extubated on 5/9 and doing well.  Tolerating PO.   VITAL SIGNS: BP (!) 187/84   Pulse 76   Temp 98.5 F (36.9 C) (Oral)   Resp 20   Ht 5\' 10"  (1.778 m)   Wt 84.4 kg (186 lb 1.1 oz)   SpO2 97%   BMI 26.70 kg/m   HEMODYNAMICS:    VENTILATOR SETTINGS:    INTAKE / OUTPUT: I/O last 3 completed shifts: In: 16453.2 [P.O.:120; I.V.:643.8; QQIWL:79892; NG/GT:559.3; IV Piggyback:150] Out: 11941 [Other:14486]  PHYSICAL EXAMINATION: General:   Middle aged male, chronically ill appearing. Screaming in pain (feet/legs) Neuro:  CN grossly intact. (-) lateralizing signs. Follows commands.  HEENT:  [place]/AT. MMM. Cardiovascular:  RRR, no M/R/G. Lungs:  Clear bilaterally.  ETT in place. Abdomen:  BS x 4, S/NT/ND. Musculoskeletal:  Right great toe amputation with dressings C/D/I.  Incisions to RLE noted with dressings C/D/I. No edema. Skin:  Warm, dry.  LABS:  BMET  Recent Labs Lab 11/05/16 0504 11/06/16 0529 11/07/16 0728  NA 140 139 136  K 3.3* 3.5 3.3*  CL 98* 96* 95*  CO2 25 23 25   BUN 34* 42* 49*  CREATININE 11.36* 11.27* 11.35*  GLUCOSE 107* 171* 131*    Electrolytes  Recent Labs Lab 11/05/16 0504 11/05/16 1510 11/05/16 1704 11/06/16 0529 11/07/16 0728  CALCIUM 7.9*  --   --  7.2* 7.1*  MG  --  1.9 2.0 2.0  --   PHOS 6.6* 7.6* 7.0* 7.6*  --     CBC  Recent Labs Lab 11/05/16 0504 11/06/16 0529 11/07/16 0728  WBC 15.5* 17.2* 18.3*  HGB 10.7* 11.0* 9.4*  HCT 34.1* 35.6* 30.4*  PLT 535* 465* 457*    Coag's No results for input(s): APTT, INR in the last 168 hours.  Sepsis Markers No results for input(s): LATICACIDVEN, PROCALCITON, O2SATVEN in the last 168 hours.  ABG  Recent Labs Lab 11/05/16 1505  PHART 7.457*  PCO2ART 32.6  PO2ART 143*    Liver Enzymes  Recent Labs Lab 11/04/16 0508  11/04/16 1445 11/05/16 0504  AST  --  22  --   ALT  --  24  --   ALKPHOS  --  219*  --   BILITOT  --  0.6  --   ALBUMIN 2.1* 2.2* 2.0*    Cardiac Enzymes  Recent Labs Lab 11/05/16 1510  TROPONINI 0.08*    Glucose  Recent Labs Lab 11/06/16 1132 11/06/16 1540 11/06/16 1925 11/06/16 2353 11/07/16 0357 11/07/16 0722  GLUCAP 102* 96 126* 152* 151* 139*    Imaging No results found.   STUDIES:  Abdominal Aortogram 4/30 > no significant renal artery stenosis identified, no significant inflow disease, diffuse calcific disease throughout the infrainguinal arteries, occlusion of the  distal superficial femoral artery and above-knee popliteal artery over a length of approximately 15 cm, anterior tibial artery on the right is occluded, the dominant runoff is the peroneal artery which is diseased distally, posterior tibial artery has severe diffuse disease.  NM Stress Test 5/3 > EF 43%, inferior hypokinesis, reversible, medium sized, mild basal to mid inferior and basal inferolateral perfusion defect, concerning for possible ischemia, intermediate risk study.   ECHO 5/4 > LVEF 60-65%, compared to prior study in 2016, there is now severe LVH, severe concentric hypertrophy, no regional WMA, grade II diastolic dysfunction, moderate LAE, mild mitral stenosis and mild pulmonary hypertension with RSVP of 35 CT Head 5/7 >  EEG 5/7 >      CULTURES: BCx2 4/25 > negative   ANTIBIOTICS: Zosyn 4/25 > 5/10 Vancomycin 4/25 x1, 5/2 >  SIGNIFICANT EVENTS: 4/25  Admit  5/07  Seen by Neurology & Psychiatry  5/08  To OR for right femoropopliteal vs femorotibial bypass & right great toe amputation  5/09  extubated  LINES/TUBES: ETT 5/8 >>   DISCUSSION: 46 y/o M with PMH of former DM (resolved with weight loss), ESRD on PD, renal cancer s/p nephrectomy, HTN/CHF and PVD admitted on 4/25 with right foot redness and swelling after an injury approximately one month ago.  He was found to have RLE gangrene and BLE PAD.  Hospital course complicated by acute encephalopathy. Taken to OR 5/8 for right CFA to below knee popliteal artery bypass, right first toe amputation, right dorsal ulcer debridement.  ASSESSMENT / PLAN:  PULMONARY A: Acute Respiratory Insufficiency - post operative amputation, RLE bypass.  P:   Extubated and doing well Keep o2 sats > 90% Incentive spirometry   CARDIOVASCULAR A:  PVD s/p RLE bypass and R great toe amputation  Hx HTN, HLD P:  Continue norvasc, ASA, isordil, labetalol  Start clonidine patch Monitor hemodynamics in ICU  PRN hydralazine for SBP  >180   RENAL A:   CKD on Peritoneal Dialysis  AVF Placement - completed 12/2012 Hypokalemia  P:   Trend BMP  Replace electrolytes as indicated  Nightly PD   GASTROINTESTINAL A:   Protein Calorie Malnutrition  Hx Obesity  P:   Renal diet   HEMATOLOGIC A:   Anemia - suspect of chronic disease & acute illness  P:  Trend CBC  Heparin SQ for DVT prophylaxis    INFECTIOUS A:   RLE Gangrene s/p R Great Toe Amputation - blood cultures negative on admit  P:   Monitor fever curve / WBC Cont Vanc for now.  Will d/c zosyn   ENDOCRINE A:   Prior DM - reportedly none since weight loss, 130lb weight loss P:   Monitor glucose on BMP    NEUROLOGIC A:   Main issue  now is agitation 2/2 severe pain/neuropathy.  Acute Encephalopathy - suspect multifactorial in the setting of possible infection / gangrene with RLE, benzodiazepines / narcotics and poor renal clearance.  Hallucination / Anxiety / Depression  Seizure - noted 5/6-5/7 but not witnessed, concern for post ictal state with AMS Hx CVA P:   Pt states he takes oxycodone and oxycontin and xanax at home. Will verify opiates. Start oxycodone scheduled. May need to start oxycontin as well. Keep fentanyl IV pushes prn.  Wean off precedex Start xanax 1 mg BID. D/c ativan. I think he has more pain issues than withdrawal.  Cont depakene, wellbutrin. Holding off on paxil 2/2 prolonged qtc.    FAMILY  - Updates: No family at bedside.    - Inter-disciplinary family meet or Palliative Care meeting due by: 5/15  CC time: 35 min.  Hayden Becton, MD 11/07/2016, 9:58 AM [facility] Pager [phone] After 3 pm or if no answer, call [phone]

## 2016-11-07 NOTE — Progress Notes (Addendum)
  Progress Note    11/07/2016 7:12 AM 2 Days Post-Op  Subjective:  Sleeping;  Awoke when bandages being removed-agitated.   Afebrile HR 60-70's NSR 366'Y-403'K systolic 74% RA  Vitals:   11/07/16 0500 11/07/16 0600  BP: 140/80 (!) 158/77  Pulse: 65 68  Resp: 17 16  Temp:      Physical Exam: Cardiac:  regular Lungs:  Non labored Incisions:  All incisions are clean and dry Extremities:  Brisk doppler signals right PT/AT   CBC    Component Value Date/Time   WBC 17.2 (H) 11/06/2016 0529   RBC 4.45 11/06/2016 0529   HGB 11.0 (L) 11/06/2016 0529   HCT 35.6 (L) 11/06/2016 0529   PLT 465 (H) 11/06/2016 0529   MCV 80.0 11/06/2016 0529   MCH 24.7 (L) 11/06/2016 0529   MCHC 30.9 11/06/2016 0529   RDW 20.0 (H) 11/06/2016 0529   LYMPHSABS 0.9 11/01/2016 0425   MONOABS 1.5 (H) 11/01/2016 0425   EOSABS 1.7 (H) 11/01/2016 0425   BASOSABS 0.1 11/01/2016 0425    BMET    Component Value Date/Time   NA 139 11/06/2016 0529   K 3.5 11/06/2016 0529   CL 96 (L) 11/06/2016 0529   CO2 23 11/06/2016 0529   GLUCOSE 171 (H) 11/06/2016 0529   BUN 42 (H) 11/06/2016 0529   CREATININE 11.27 (H) 11/06/2016 0529   CALCIUM 7.2 (L) 11/06/2016 0529   GFRNONAA 5 (L) 11/06/2016 0529   GFRAA 6 (L) 11/06/2016 0529    INR    Component Value Date/Time   INR 1.24 10/30/2016 0614     Intake/Output Summary (Last 24 hours) at 11/07/16 2595 Last data filed at 11/07/16 0600  Gross per 24 hour  Intake           793.73 ml  Output                0 ml  Net           793.73 ml     Assessment:  46 y.o. male is s/p:  1. Rightcommon femoral artery to below-the-knee popliteal artery bypass with non-reversed ipsilateral greater saphenous vein  2. Right first ray amputation 3. Right dorsal ulcer debridement  2 Days Post-Op  Plan: -pt continues to have brisk right PT/AT doppler signals -bandages removed and incisions are clean and dry  -DVT prophylaxis:  SQ heparin -pt extubated  yesterday-remains on Precedex for confusion -Prevalon boots to relieve pressure on feet -labs to be drawn  -dry gauze to right groin -pt on Vanc/Zosyn-okay to dc from vascular standpoint, but source of confusion could be from elsewhere given increasing leukocytosis.   Leontine Locket, PA-C [facility] [phone] 11/07/2016 7:12 AM  Addendum  I have independently interviewed and examined the patient, and I agree with the physician assistant's findings.  Pt mental status still not at baseline.  ABI demonstrate improved blood flow.  Continued increasing leukocytosis after L 1st ray amputation suggest another etiology might be the actually etiology.  Would broaden DDx and consider blood cultures and  peritoneal fluid cx.    Adele Barthel, MD, FACS [facility] [place] Office: [phone] Pager: [phone]  11/07/2016, 11:13 AM

## 2016-11-07 NOTE — Progress Notes (Signed)
   [facility] PCCM  > wife updated via phone.   Apparently, pt has been agitated/combative since admission on 4/25.  Psychiatry as well as neurology were consulted.  Delirium/encephalopathy likely 2/2 Withdrawals + Pain from PVD + sepsis.  He was intubated for R foot surgery and on the following day post op, he was very well  behaved and calm so he was extubated.  He is slowly back to where he was on admission.   > we started his Norco 4x/day today and Xanax BID.  He takes these daily. He might be opiate withdrawing.  > cont precedex drip > cont fentanyl pushes IV > If he gets wilder and agitated, he may end up being intubated if he ends up not protecting his airway.  > he was supposed to get a cranial CT scan but he is too agitated to lay flat on the scanner.  > cont Associate Professor. Fall risk.    Monica Becton, MD 11/07/2016, 4:11 PM [facility] Pager [phone] After 3 pm or if no answer, call [phone]

## 2016-11-08 LAB — GLUCOSE, CAPILLARY
GLUCOSE-CAPILLARY: 129 mg/dL — AB (ref 65–99)
GLUCOSE-CAPILLARY: 131 mg/dL — AB (ref 65–99)
GLUCOSE-CAPILLARY: 91 mg/dL (ref 65–99)
Glucose-Capillary: 145 mg/dL — ABNORMAL HIGH (ref 65–99)
Glucose-Capillary: 90 mg/dL (ref 65–99)
Glucose-Capillary: 85 mg/dL (ref 65–99)

## 2016-11-08 LAB — RENAL FUNCTION PANEL
ANION GAP: 14 (ref 5–15)
Albumin: 1.6 g/dL — ABNORMAL LOW (ref 3.5–5.0)
BUN: 51 mg/dL — ABNORMAL HIGH (ref 6–20)
CHLORIDE: 97 mmol/L — AB (ref 101–111)
CO2: 23 mmol/L (ref 22–32)
Calcium: 7 mg/dL — ABNORMAL LOW (ref 8.9–10.3)
Creatinine, Ser: 10.99 mg/dL — ABNORMAL HIGH (ref 0.61–1.24)
GFR calc non Af Amer: 5 mL/min — ABNORMAL LOW (ref >60)
GFR, EST AFRICAN AMERICAN: 6 mL/min — AB (ref >60)
Glucose, Bld: 135 mg/dL — ABNORMAL HIGH (ref 65–99)
Phosphorus: 7 mg/dL — ABNORMAL HIGH (ref 2.5–4.6)
Potassium: 2.9 mmol/L — ABNORMAL LOW (ref 3.5–5.1)
Sodium: 134 mmol/L — ABNORMAL LOW (ref 135–145)

## 2016-11-08 LAB — CBC
HCT: 29.6 % — ABNORMAL LOW (ref 39.0–52.0)
HEMOGLOBIN: 9.1 g/dL — AB (ref 13.0–17.0)
MCH: 24.7 pg — AB (ref 26.0–34.0)
MCHC: 30.7 g/dL (ref 30.0–36.0)
MCV: 80.4 fL (ref 78.0–100.0)
PLATELETS: 439 10*3/uL — AB (ref 150–400)
RBC: 3.68 MIL/uL — AB (ref 4.22–5.81)
RDW: 20.2 % — ABNORMAL HIGH (ref 11.5–15.5)
WBC: 17.3 10*3/uL — ABNORMAL HIGH (ref 4.0–10.5)

## 2016-11-08 MED ORDER — CALCITRIOL 0.5 MCG PO CAPS
0.5000 ug | ORAL_CAPSULE | Freq: Every day | ORAL | Status: DC
  Administered 2016-11-08 – 2016-11-19 (×11): 0.5 ug via ORAL
  Filled 2016-11-08: qty 2
  Filled 2016-11-08: qty 1
  Filled 2016-11-08 (×9): qty 2

## 2016-11-08 MED ORDER — HYDROCODONE-ACETAMINOPHEN 5-325 MG PO TABS
1.0000 | ORAL_TABLET | Freq: Four times a day (QID) | ORAL | Status: DC | PRN
  Administered 2016-11-08 – 2016-11-09 (×2): 1 via ORAL
  Filled 2016-11-08 (×2): qty 1

## 2016-11-08 MED ORDER — SUCROFERRIC OXYHYDROXIDE 500 MG PO CHEW
500.0000 mg | CHEWABLE_TABLET | Freq: Three times a day (TID) | ORAL | Status: DC
  Administered 2016-11-08 – 2016-11-09 (×5): 500 mg via ORAL
  Filled 2016-11-08 (×7): qty 1

## 2016-11-08 MED ORDER — DELFLEX-LC/2.5% DEXTROSE 394 MOSM/L IP SOLN
INTRAPERITONEAL | Status: DC
  Administered 2016-11-10: 5000 mL via INTRAPERITONEAL

## 2016-11-08 MED ORDER — POTASSIUM CHLORIDE CRYS ER 10 MEQ PO TBCR
40.0000 meq | EXTENDED_RELEASE_TABLET | Freq: Once | ORAL | Status: AC
  Administered 2016-11-08: 40 meq via ORAL
  Filled 2016-11-08: qty 4

## 2016-11-08 MED ORDER — DARBEPOETIN ALFA 200 MCG/0.4ML IJ SOSY
200.0000 ug | PREFILLED_SYRINGE | INTRAMUSCULAR | Status: DC
  Administered 2016-11-10 – 2016-11-17 (×2): 200 ug via SUBCUTANEOUS
  Filled 2016-11-08 (×2): qty 0.4

## 2016-11-08 MED ORDER — HYDROCODONE-ACETAMINOPHEN 5-325 MG PO TABS
1.0000 | ORAL_TABLET | Freq: Two times a day (BID) | ORAL | Status: DC
  Administered 2016-11-08: 1 via ORAL
  Filled 2016-11-08: qty 1

## 2016-11-08 MED ORDER — DELFLEX-LC/1.5% DEXTROSE 344 MOSM/L IP SOLN
INTRAPERITONEAL | Status: DC
  Administered 2016-11-10: 5000 mL via INTRAPERITONEAL

## 2016-11-08 MED ORDER — CINACALCET HCL 30 MG PO TABS
60.0000 mg | ORAL_TABLET | Freq: Every day | ORAL | Status: DC
  Administered 2016-11-08 – 2016-11-19 (×11): 60 mg via ORAL
  Filled 2016-11-08 (×11): qty 2

## 2016-11-08 MED ORDER — PRO-STAT SUGAR FREE PO LIQD
30.0000 mL | Freq: Two times a day (BID) | ORAL | Status: DC
  Administered 2016-11-08 – 2016-11-19 (×17): 30 mL via ORAL
  Filled 2016-11-08 (×18): qty 30

## 2016-11-08 NOTE — Progress Notes (Signed)
Hypoglycemic Event  CBG: 60  Treatment: 4oz. Orange juice  Symptoms: None  Follow-up CBG: Time:1753 CBG Result:90  Possible Reasons for Event:patient did not eat much of his lunch.   Comments/MD notified:Will continue to monitor closely    Vonna Drafts

## 2016-11-08 NOTE — Progress Notes (Signed)
Inpatient Rehabilitation  OT has evaluated pt. and is recommending IP Rehab.  PT continues to recommend the same.  Please order IP rehab consult if you are agreeable.  Please call if questions.  [facility] Admissions Coordinator Cell [phone]

## 2016-11-08 NOTE — Progress Notes (Signed)
   Daily Progress Note   Assessment/Planning:   POD #3 s/p R CFA to BK pop BPG w/ NR ips GSV, R 1st ray amp, R dorsal ulcer debridement   Delirium: much worst this AM than yesterday, defer to primary service  R femoropop bypass: all incisions intact, dry dressing to R groin daily  R 1st ray amp: continue dressing changes daily.  Usually take 1-2 weeks for an amputation to declare itself if there is going to be problems healing.   Subjective  - 3 Days Post-Op   Mumbling incoherently   Objective   Vitals:   11/08/16 0400 11/08/16 0500 11/08/16 0600 11/08/16 0700  BP: (!) 164/87 (!) 175/81 (!) 172/86 (!) 171/76  Pulse: 69 66 66 66  Resp: 17 16 15 16   Temp: 99.1 F (37.3 C)     TempSrc: Oral     SpO2: 97% 97% 95% 97%  Weight: 193 lb 5.5 oz (87.7 kg)     Height:         Intake/Output Summary (Last 24 hours) at 11/08/16 0738 Last data filed at 11/08/16 0700  Gross per 24 hour  Intake          16668.4 ml  Output            14697 ml  Net           1971.4 ml     PULM  BLL rales  CV  RRR  VASC R groin: inc c/d/I, Vein harvest inc x 3: c/d/I, Calf inc c/d/I, R foot bandaged, pink toes  NEURO Unable to coop with exam    Laboratory CBC CBC Latest Ref Rng & Units 11/08/2016 11/07/2016 11/06/2016  WBC 4.0 - 10.5 K/uL 17.3(H) 18.3(H) 17.2(H)  Hemoglobin 13.0 - 17.0 g/dL 9.1(L) 9.4(L) 11.0(L)  Hematocrit 39.0 - 52.0 % 29.6(L) 30.4(L) 35.6(L)  Platelets 150 - 400 K/uL 439(H) 457(H) 465(H)     BMET    Component Value Date/Time   NA 134 (L) 11/08/2016 0238   K 2.9 (L) 11/08/2016 0238   CL 97 (L) 11/08/2016 0238   CO2 23 11/08/2016 0238   GLUCOSE 135 (H) 11/08/2016 0238   BUN 51 (H) 11/08/2016 0238   CREATININE 10.99 (H) 11/08/2016 0238   CALCIUM 7.0 (L) 11/08/2016 0238   GFRNONAA 5 (L) 11/08/2016 0238   GFRAA 6 (L) 11/08/2016 0238      Adele Barthel, MD, FACS [facility] [place] Office: [phone] Pager: [phone]  11/08/2016, 7:38  AM

## 2016-11-08 NOTE — Progress Notes (Signed)
Pt called out requesting to speak to "a preacher or any person with true knowledge of God", Offered to call the chaplain for pt and he accepted. Chaplain is en route.

## 2016-11-08 NOTE — Progress Notes (Signed)
PULMONARY / CRITICAL CARE MEDICINE   Name: [name] MRN: [id number] DOB: 1970/11/02    ADMISSION DATE:  10/23/2016 CONSULTATION DATE:  11/05/16  REFERRING MD:  Bridgett Larsson  CHIEF COMPLAINT:  Vent Management Post Op  HISTORY OF PRESENT ILLNESS:  Pt is encephelopathic; therefore, this HPI is obtained from chart review. Hayden [name] Cahalan is a 46 y.o. M with PMH as outlined below including ESRD on peritoneal dialysis. He was admitted 4/25 for dry gangrene and cellulitis on the right foot./Right great toe.  He had been seen in the ED roughly 1 week prior to this was treated on vancomycin but ended up leaving AMA.  He was treated with empiric antibiotics but unfortunately failed conservative management.  Vascular surgery was consulted and arteriogram showed severe calcification of his entire arterial system with R SFA occlusion.  He was taken to the OR 5/8 for right CFA to below knee popliteal artery bypass, right first toe amputation, right dorsal ulcer debridement. Following the procedure, he returned to the ICU and remained on the ventilator. PCCM was called for management while in ICU and for possible extubation.  Of note, 5/7 patient was extremely agitated and combative and had altered mental status. Neurology was consulted and felt that etiology was likely metabolic versus questionable withdrawal. CT head and EEG were ordered, but as of 5/8, these have not been performed.   SUBJECTIVE:   Remains agitated and delirious and was verbally assaulting staff last night.  Still on precedex drip Tolerated PD this am.  Sleeping and comfortable this am.    VITAL SIGNS: BP (!) 180/78 (BP Location: Right Arm)   Pulse 67   Temp 97.6 F (36.4 C) (Oral)   Resp 14   Ht 5\' 10"  (1.778 m)   Wt 87.7 kg (193 lb 5.5 oz)   SpO2 100%   BMI 27.74 kg/m   HEMODYNAMICS:    VENTILATOR SETTINGS:    INTAKE / OUTPUT: I/O last 3 completed shifts: In: 31969.7 [P.O.:890; I.V.:1065.7; CNOBS:96283; IV  Piggyback:50] Out: 66294 [Other:29080; Stool:1]  PHYSICAL EXAMINATION: General:  Middle aged male, chronically ill appearing. Sleeping and comfortable and NAD. Denies pain.  Neuro:  CN grossly intact. (-) lateralizing signs. Follows commands.  HEENT:  [place]/AT. MMM. Cardiovascular:  RRR, no M/R/G. Lungs:  Clear bilaterally.  ETT in place. Abdomen:  BS x 4, S/NT/ND. Musculoskeletal:  Right great toe amputation with dressings C/D/I.  Incisions to RLE noted with dressings C/D/I. No edema. Skin:  Warm, dry.  LABS:  BMET  Recent Labs Lab 11/06/16 0529 11/07/16 0728 11/08/16 0238  NA 139 136 134*  K 3.5 3.3* 2.9*  CL 96* 95* 97*  CO2 23 25 23   BUN 42* 49* 51*  CREATININE 11.27* 11.35* 10.99*  GLUCOSE 171* 131* 135*    Electrolytes  Recent Labs Lab 11/05/16 1510 11/05/16 1704 11/06/16 0529 11/07/16 0728 11/08/16 0238  CALCIUM  --   --  7.2* 7.1* 7.0*  MG 1.9 2.0 2.0  --   --   PHOS 7.6* 7.0* 7.6*  --  7.0*    CBC  Recent Labs Lab 11/06/16 0529 11/07/16 0728 11/08/16 0238  WBC 17.2* 18.3* 17.3*  HGB 11.0* 9.4* 9.1*  HCT 35.6* 30.4* 29.6*  PLT 465* 457* 439*    Coag's No results for input(s): APTT, INR in the last 168 hours.  Sepsis Markers No results for input(s): LATICACIDVEN, PROCALCITON, O2SATVEN in the last 168 hours.  ABG  Recent Labs Lab 11/05/16 1505  PHART 7.457*  PCO2ART 32.6  PO2ART 143*    Liver Enzymes  Recent Labs Lab 11/04/16 1445 11/05/16 0504 11/08/16 0238  AST 22  --   --   ALT 24  --   --   ALKPHOS 219*  --   --   BILITOT 0.6  --   --   ALBUMIN 2.2* 2.0* 1.6*    Cardiac Enzymes  Recent Labs Lab 11/05/16 1510  TROPONINI 0.08*    Glucose  Recent Labs Lab 11/07/16 1128 11/07/16 1534 11/07/16 1942 11/07/16 2337 11/08/16 0401 11/08/16 0738  GLUCAP 77 131* 126* 121* 129* 131*    Imaging No results found.   STUDIES:  Abdominal Aortogram 4/30 > no significant renal artery stenosis identified, no  significant inflow disease, diffuse calcific disease throughout the infrainguinal arteries, occlusion of the distal superficial femoral artery and above-knee popliteal artery over a length of approximately 15 cm, anterior tibial artery on the right is occluded, the dominant runoff is the peroneal artery which is diseased distally, posterior tibial artery has severe diffuse disease.  NM Stress Test 5/3 > EF 43%, inferior hypokinesis, reversible, medium sized, mild basal to mid inferior and basal inferolateral perfusion defect, concerning for possible ischemia, intermediate risk study.   ECHO 5/4 > LVEF 60-65%, compared to prior study in 2016, there is now severe LVH, severe concentric hypertrophy, no regional WMA, grade II diastolic dysfunction, moderate LAE, mild mitral stenosis and mild pulmonary hypertension with RSVP of 35 CT Head 5/7 >  EEG 5/7 >      CULTURES: BCx2 4/25 > negative  Peritoneal fluid 5/11 >  ANTIBIOTICS: Zosyn 4/25 > 5/10 Vancomycin 4/25 x1, 5/2 >  SIGNIFICANT EVENTS: 4/25  Admit  5/07  Seen by Neurology & Psychiatry  5/08  To OR for right femoropopliteal vs femorotibial bypass & right great toe amputation  5/09  extubated  LINES/TUBES: ETT 5/8 >>   DISCUSSION: 46 y/o M with PMH of former DM (resolved with weight loss), ESRD on PD, renal cancer s/p nephrectomy, HTN/CHF and PVD admitted on 4/25 with right foot redness and swelling after an injury approximately one month ago.  He was found to have RLE gangrene and BLE PAD.  Hospital course complicated by acute encephalopathy/delirium Taken to OR 5/8 for right CFA to below knee popliteal artery bypass, right first toe amputation, right dorsal ulcer debridement.  He is still in ICU 2/2 being on precedex drip 2/2 agitation/delirium   ASSESSMENT / PLAN:  PULMONARY A: S/P Acute Respiratory Failure - post operative amputation, RLE bypass.  P:   S/P extubation on 5/9. Protecting airway.  Keep o2 sats >  90% Incentive spirometry   CARDIOVASCULAR A:  PVD s/p RLE bypass and R great toe amputation  Hx HTN, HLD P:  Continue norvasc, ASA, isordil, labetalol, clonidine patch Monitor hemodynamics in ICU  PRN hydralazine for SBP >180   RENAL A:   CKD on Peritoneal Dialysis  AVF Placement - completed 12/2012 Hypokalemia  P:   Trend BMP  Replace electrolytes as indicated  Nightly PD   GASTROINTESTINAL A:   Protein Calorie Malnutrition  Hx Obesity  P:   Renal diet   HEMATOLOGIC A:   Anemia - suspect of chronic disease & acute illness  P:  Trend CBC  Heparin SQ for DVT prophylaxis    INFECTIOUS A:   RLE Gangrene s/p R Great Toe Amputation - blood cultures negative on admit  Concern for peritonitis P:   Monitor fever curve / WBC Cont Vanc  for now.   PD fulid to be sent 5/11   ENDOCRINE A:   Prior DM - reportedly none since weight loss, 130lb weight loss P:   Monitor glucose on BMP  Keep on SSI   NEUROLOGIC A:   Main issue now is agitation 2/2 severe pain/neuropathy + Delirium + Withdrawal Acute Encephalopathy - suspect multifactorial in the setting of possible infection / gangrene with RLE, benzodiazepines / narcotics and poor renal clearance.  Hallucination / Anxiety / Depression  Seizure - noted 5/6-5/7 but not witnessed, concern for post ictal state with AMS Hx CVA P:   Pt states he takes oxycodone and xanax at home. We started Norco QID on 5/10 and kept prn fentanyl.  He seems better over all but is sleepy this am.  Will decrease dose of Norco to BID and assess his delirium next 24 hrs.  If he gets wild again, may increase Norco to TID or QID. Keep prn fentanyl.  Wean off precedex drip.  I doubt precedex is helping.  Once off precedex drip, he can transfer out of ICU.  Cont Xanax BID.  Cont depakene, wellbutrin. Holding off on paxil 2/2 prolonged qtc.    FAMILY  - Updates: No family at bedside.  I spoke with wife on 5/10.   -  Inter-disciplinary family meet or Palliative Care meeting due by: 5/15    J. Shirl Harris, MD 11/08/2016, 9:44 AM [facility] Pager [phone] After 3 pm or if no answer, call [phone]

## 2016-11-08 NOTE — Progress Notes (Signed)
[facility] Progress Note   Subjective:  Still remains agitated.  ? Weights, UF volume  Objective Vitals:   11/08/16 0820 11/08/16 0900 11/08/16 1000 11/08/16 1100  BP:  (!) 159/85 (!) 179/85 (!) 162/83  Pulse:  67 67 65  Resp:  (!) _0 Temp:      TempSrc:      SpO2: 100% 98% 95% 99%  Weight:      Height:       Physical Exam General: sleeping, talking in sleep Heart: RRR; no murmur Lungs: bilateral coarse breath sounds Abdomen: soft, non-tender. Extremities: No LE edema. R foot with sterile dressing Dialysis Access: PD cath and L forearm AVF + thrill  Additional Objective Labs: Basic Metabolic Panel:  Recent Labs Lab 11/05/16 1704 11/06/16 0529 11/07/16 0728 11/08/16 0238  NA  --  139 136 134*  K  --  3.5 3.3* 2.9*  CL  --  96* 95* 97*  CO2  --  _1 GLUCOSE  --  171* 131* 135*  BUN  --  42* 49* 51*  CREATININE  --  11.27* 11.35* 10.99*  CALCIUM  --  7.2* 7.1* 7.0*  PHOS 7.0* 7.6*  --  7.0*   Liver Function Tests:  Recent Labs Lab 11/04/16 1445 11/05/16 0504 11/08/16 0238  AST 22  --   --   ALT 24  --   --   ALKPHOS 219*  --   --   BILITOT 0.6  --   --   PROT 6.0*  --   --   ALBUMIN 2.2* 2.0* 1.6*   CBC:  Recent Labs Lab 11/03/16 0412 11/05/16 0504 11/06/16 0529 11/07/16 0728 11/08/16 0238  WBC 14.0* 15.5* 17.2* 18.3* 17.3*  HGB 10.2* 10.7* 11.0* 9.4* 9.1*  HCT 32.7* 34.1* 35.6* 30.4* 29.6*  MCV 79.4 80.2 80.0 79.6 80.4  PLT 543* 535* 465* 457* 439*   CBG:  Recent Labs Lab 11/07/16 1534 11/07/16 1942 11/07/16 2337 11/08/16 0401 11/08/16 0738  GLUCAP 131* 126* 121* 129* 131*   Medications: . sodium chloride Stopped (11/05/16 1412)  . sodium chloride 500 mL (11/08/16 0400)  . dexmedetomidine (PRECEDEX) IV infusion Stopped (11/08/16 1054)  . dialysis solution 1.5% low-MG/low-CA    . [START ON 11/09/2016] vancomycin     . ALPRAZolam  1 mg Oral BID  . amLODipine  5 mg Per Tube Daily  . aspirin  81 mg  Per Tube Daily  . budesonide (PULMICORT) nebulizer solution  0.5 mg Nebulization BID  . buPROPion  150 mg Oral Daily   And  . buPROPion  300 mg Oral QHS  . calcitRIOL  0.5 mcg Oral Daily  . chlorhexidine gluconate (MEDLINE KIT)  15 mL Mouth Rinse BID  . cinacalcet  60 mg Oral Q supper  . cloNIDine  0.2 mg Transdermal Weekly  . darbepoetin (ARANESP) injection - NON-DIALYSIS  100 mcg Subcutaneous Q Sun-1800  . divalproex  500 mg Oral Q12H  . docusate  100 mg Per Tube Daily  . feeding supplement (PRO-STAT SUGAR FREE 64)  30 mL Oral BID  . gentamicin cream  1 application Topical Daily  . heparin  5,000 Units Subcutaneous Q8H  . HYDROcodone-acetaminophen  1 tablet Oral Q12H  . insulin aspart  0-9 Units Subcutaneous Q4H  . isosorbide dinitrate  5 mg Oral BID  . labetalol  200 mg Oral BID  . mouth rinse  15 mL Mouth Rinse QID  . pantoprazole  40  mg Oral Daily  . pravastatin  20 mg Per Tube Daily  . saccharomyces boulardii  250 mg Oral BID  . sodium chloride flush  3 mL Intravenous Q12H  . sucroferric oxyhydroxide  500 mg Oral TID WC    Dialysis Orders: [facility].  CCPD: 3L fill 5 exchanges at night with 5th fill manually drained 1-1/2 hours later. Uses mostly 1's and 2's at home.   Assessment/Plan: 1. Ischemic/gangrenous R great toe/Severe PAD: s/p OR with VVS.  Off Zosyn, continuing vanc 2. ESRD: Continue CCPD, will use 1.5% and 2.5% tonight 3. Anemia: Hgb 10.2--> 9.1, continue Aranesp weekly (100 mcg last given 5/6), will increase to 200 mcg to be given Sunday 5/13. 4. Secondary hyperparathyroidism: Phos high, but coming down. Continue binders/VDRA/sensipar--> have put these back on MAR today; have changed to non-calcium based binder given severe PAD, will try velphoro 5. HTN/volume: BP high, think agitation mediated but will use 1.5% and 2.5%. Follow BP. 6. Nutrition: Alb 2.1. Continue protein supps. 7. Abnormal myoview stress test: EF 43%. Reversible, medium sized, mild  basal to mid inferior and basal inferolateral perfusion defect concerning for ischemia. Per cards, nuc medicine study is inaccurate, no plans for further investigation.   8. Acute encephalopathy: infection vs drugs vs intracranial process? Psych and neuro following.  He is still agitated but appears to be improving somewhat?  EEG nonspecific, CT/ MRI not able to be performed.  Possible benzo/ opiate withdrawal.  Doing a PD fluid cell ct and culture--> he has been on IV antibiotics for several days and his abd is not tender but still need to rule this out.  Madelon Lips MD 11/08/2016, 11:21 AM  [facility] Pager: [phone]

## 2016-11-08 NOTE — Progress Notes (Signed)
Pt increasingly restless and agittated over the last thirty minutes. Chaplain at bedside with pt, eLink MD updated with pt situation regarding change in mental status, was able to visualize pt behavior via camera. Currently VS stable and seems to benefit from chaplains reassurance.No new orders at this time.

## 2016-11-08 NOTE — Evaluation (Signed)
Occupational Therapy Evaluation Patient Details Name: Hayden [name] MRN: [id number] DOB: 06-Dec-1970 Today's Date: 11/08/2016    History of Present Illness Pt is a 46 y.o. M  admitted 4/25 for dry gangrene and cellulitis on the right foot./Right great toe,  and is now s/p  right CFA to below knee popliteal artery bypass, right first toe amputation, right dorsal ulcer debridement performed on 5/8.  He was extubated morning of 5/9. Pt with post-op encephalopathy and agitation. PMH - ESRD on peritoneal dialysis, HTN, CVA, kidney CA.   Clinical Impression   Pt reports he was independent with ADL PTA. Currently pt requires mod assist +2 for stand pivot transfers and min-max assist for ADL. Pt presenting with impaired cognition, pain, poor balance in sitting and standing, dizziness with positional changes, and generalized weakness impacting his independence and safety with ADL and functional mobility. Recommending CIR level therapies to maximize independence and safety with ADL and functional mobility prior to return home. Pt would benefit from continued skilled OT to address established goals.    Follow Up Recommendations  CIR;Supervision/Assistance - 24 hour    Equipment Recommendations  Other (comment) (TBD)    Recommendations for Other Services       Precautions / Restrictions Precautions Precautions: Fall Restrictions Weight Bearing Restrictions: No (Pt choosing to not put weight on rt foot due to pain)      Mobility Bed Mobility Overal bed mobility: Needs Assistance Bed Mobility: Supine to Sit;Sit to Supine;Rolling     Supine to sit: +2 for physical assistance;HOB elevated;Mod assist     General bed mobility comments: Assist to bring legs off bed, elevate trunk into sitting, and bring hips to EOB.  Transfers Overall transfer level: Needs assistance Equipment used: Rolling walker (2 wheeled) Transfers: Sit to/from [facility] Sit to Stand: +2 physical  assistance;Min assist Stand pivot transfers: +2 physical assistance;Mod assist       General transfer comment: Assist to bring hips up and for balance. Pt keeping RLE NWB due to pain. Assist for balance and support for pivotal steps from bed to recliner.    Balance Overall balance assessment: Needs assistance Sitting-balance support: Bilateral upper extremity supported Sitting balance-Leahy Scale: Poor Sitting balance - Comments: UE support. Sat EOB x 15 minutes with min A to supervision   Standing balance support: Bilateral upper extremity supported Standing balance-Leahy Scale: Poor Standing balance comment: walker and +2 min A for static standing                           ADL either performed or assessed with clinical judgement   ADL Overall ADL's : Needs assistance/impaired Eating/Feeding: Set up;Sitting   Grooming: Minimal assistance;Sitting   Upper Body Bathing: Moderate assistance;Sitting   Lower Body Bathing: Maximal assistance;Sit to/from stand   Upper Body Dressing : Moderate assistance;Sitting   Lower Body Dressing: Maximal assistance;Sit to/from stand   Toilet Transfer: Moderate assistance;+2 for physical assistance;Stand-pivot;RW Toilet Transfer Details (indicate cue type and reason): Simulated by stand pivot from EOB to chair         Functional mobility during ADLs: Moderate assistance;Rolling walker;+2 for physical assistance       Vision         Perception     Praxis      Pertinent Vitals/Pain Pain Assessment: Faces Faces Pain Scale: Hurts whole lot Pain Location: R LE with mobilizing Pain Descriptors / Indicators: Grimacing;Guarding Pain Intervention(s): Limited activity within patient's tolerance;Monitored  during session;Repositioned     Hand Dominance     Extremity/Trunk Assessment Upper Extremity Assessment Upper Extremity Assessment: Generalized weakness   Lower Extremity Assessment Lower Extremity Assessment: Defer to  PT evaluation   Cervical / Trunk Assessment Cervical / Trunk Assessment: Kyphotic   Communication Communication Communication: Other (comment) (mumbling at times)   Cognition Arousal/Alertness: Lethargic;Awake/alert (Initially lethargic but once aroused able to maintain ) Behavior During Therapy: [facility] for tasks assessed/performed Overall Cognitive Status: Impaired/Different from baseline Area of Impairment: Orientation;Following commands;Safety/judgement;Attention;Problem solving                 Orientation Level: Place;Time;Situation Current Attention Level: Sustained   Following Commands: Follows one step commands with increased time Safety/Judgement: Decreased awareness of safety;Decreased awareness of deficits   Problem Solving: Slow processing;Requires verbal cues;Requires tactile cues General Comments: Pt reporting some hallucinations. Periods of good awareness and periods of more confusion.   General Comments       Exercises     Shoulder Instructions      Home Living Family/patient expects to be discharged to:: Private residence Living Arrangements: Spouse/significant other Available Help at Discharge: Family;Available PRN/intermittently (wife works swing shift) Type of Home: Mobile home Home Access: Stairs to enter [employer] of Steps: 4 Entrance Stairs-Rails: Right;Left Home Layout: One level     Bathroom Shower/Tub: Walk-in shower;Door   [employer] Toilet: Standard     Home Equipment: Cane - single point;Grab bars - tub/shower;Hand held shower head          Prior Functioning/Environment Level of Independence: Independent                 OT Problem List: Decreased strength;Decreased activity tolerance;Impaired balance (sitting and/or standing);Decreased cognition;Decreased knowledge of use of DME or AE;Decreased knowledge of precautions;Pain      OT Treatment/Interventions: Self-care/ADL training;Therapeutic exercise;DME and/or  AE instruction;Energy conservation;Therapeutic activities;Patient/family education;Balance training    OT Goals(Current goals can be found in the care plan section) Acute Rehab OT Goals Patient Stated Goal: get back home OT Goal Formulation: With patient Time For Goal Achievement: 11/22/16 Potential to Achieve Goals: Good ADL Goals Pt Will Perform Grooming: with supervision;sitting (x3 tasks) Pt Will Perform Upper Body Bathing: with supervision;sitting Pt Will Perform Lower Body Bathing: with min guard assist;sit to/from stand Pt Will Transfer to Toilet: with min assist;ambulating;bedside commode (over toilet)  OT Frequency: Min 3X/week   Barriers to D/C:            Co-evaluation PT/OT/SLP Co-Evaluation/Treatment: Yes Reason for Co-Treatment: For patient/therapist safety   OT goals addressed during session: ADL's and self-care      AM-PAC PT "6 Clicks" Daily Activity     Outcome Measure Help from another person eating meals?: A Little Help from another person taking care of personal grooming?: A Little Help from another person toileting, which includes using toliet, bedpan, or urinal?: A Lot Help from another person bathing (including washing, rinsing, drying)?: A Lot Help from another person to put on and taking off regular upper body clothing?: A Little Help from another person to put on and taking off regular lower body clothing?: A Lot 6 Click Score: 15   End of Session Equipment Utilized During Treatment: Gait belt;Rolling walker Nurse Communication: Mobility status  Activity Tolerance: Patient tolerated treatment well Patient left: in chair;with call bell/phone within reach;with chair alarm set;with nursing/sitter in room;with restraints reapplied  OT Visit Diagnosis: Other abnormalities of gait and mobility (R26.89);Unsteadiness on feet (R26.81);Muscle weakness (generalized) (M62.81)  Time: 9169-4503 OT Time Calculation (min): 28 min Charges:  OT  General Charges $OT Visit: 1 Procedure OT Evaluation $OT Eval Moderate Complexity: 1 Procedure G-Codes:     Zainah Steven A. Ulice Brilliant, M.S., OTR/L Pager: [place] 11/08/2016, 1:44 PM

## 2016-11-08 NOTE — Progress Notes (Signed)
Physical Therapy Treatment Patient Details Name: Hayden [name] MRN: [id number] DOB: 01-26-1971 Today's Date: 11/08/2016    History of Present Illness Pt is a 46 y.o. M  admitted 4/25 for dry gangrene and cellulitis on the right foot./Right great toe,  and is now s/p  right CFA to below knee popliteal artery bypass, right first toe amputation, right dorsal ulcer debridement performed on 5/8.  He was extubated morning of 5/9. Pt with post-op encephalopathy and agitation. PMH - ESRD on peritoneal dialysis, HTN, CVA, kidney CA.    PT Comments    Pt with improving mental status and mobility. Pt with some orthostasis with initial sitting but BP trending back up with incr time sitting. Continue to recommend rehab.   Follow Up Recommendations  CIR     Equipment Recommendations  Rolling walker with 5" wheels    Recommendations for Other Services       Precautions / Restrictions Precautions Precautions: Fall Restrictions Weight Bearing Restrictions: No (Pt choosing to not put weight on rt foot due to pain)    Mobility  Bed Mobility Overal bed mobility: Needs Assistance Bed Mobility: Supine to Sit;Sit to Supine;Rolling     Supine to sit: +2 for physical assistance;HOB elevated;Mod assist     General bed mobility comments: Assist to bring legs off bed, elevate trunk into sitting, and bring hips to EOB.  Transfers Overall transfer level: Needs assistance Equipment used: Rolling walker (2 wheeled) Transfers: Sit to/from [facility] Sit to Stand: +2 physical assistance;Min assist Stand pivot transfers: +2 physical assistance;Mod assist       General transfer comment: Assist to bring hips up and for balance. Pt keeping RLE NWB due to pain. Assist for balance and support for pivotal steps from bed to recliner.  Ambulation/Gait Ambulation/Gait assistance: +2 physical assistance;Mod assist Ambulation Distance (Feet): 2 Feet Assistive device: Rolling walker (2  wheeled) Gait Pattern/deviations: Step-to pattern;Trunk flexed (hop to) Gait velocity: decr Gait velocity interpretation: Below normal speed for age/gender General Gait Details: Assist for pivotal steps from bed to recliner   Stairs            Wheelchair Mobility    Modified Rankin (Stroke Patients Only)       Balance Overall balance assessment: Needs assistance Sitting-balance support: Bilateral upper extremity supported Sitting balance-Leahy Scale: Poor Sitting balance - Comments: UE support. Sat EOB x 15 minutes with min A to supervision   Standing balance support: Bilateral upper extremity supported Standing balance-Leahy Scale: Poor Standing balance comment: walker and +2 min A for static standing                            Cognition Arousal/Alertness: Lethargic;Awake/alert (Initially lethargic but once aroused able to maintain ) Behavior During Therapy: [facility] for tasks assessed/performed Overall Cognitive Status: Impaired/Different from baseline Area of Impairment: Orientation;Following commands;Safety/judgement;Attention;Problem solving                 Orientation Level: Place;Time;Situation Current Attention Level: Sustained   Following Commands: Follows one step commands with increased time Safety/Judgement: Decreased awareness of safety;Decreased awareness of deficits   Problem Solving: Slow processing;Requires verbal cues;Requires tactile cues General Comments: Pt reporting some hallucinations. Periods of good awareness and periods of more confusion.      Exercises      General Comments        Pertinent Vitals/Pain Pain Assessment: Faces Faces Pain Scale: Hurts whole lot Pain Location: R LE  with mobilizing Pain Descriptors / Indicators: Grimacing;Guarding Pain Intervention(s): Limited activity within patient's tolerance;Monitored during session;Patient requesting pain meds-RN notified    Home Living                       Prior Function            PT Goals (current goals can now be found in the care plan section) Progress towards PT goals: Progressing toward goals    Frequency    Min 3X/week      PT Plan Current plan remains appropriate    Co-evaluation PT/OT/SLP Co-Evaluation/Treatment: Yes Reason for Co-Treatment: For patient/therapist safety          AM-PAC PT "6 Clicks" Daily Activity  Outcome Measure  Difficulty turning over in bed (including adjusting bedclothes, sheets and blankets)?: Total Difficulty moving from lying on back to sitting on the side of the bed? : Total Difficulty sitting down on and standing up from a chair with arms (e.g., wheelchair, bedside commode, etc,.)?: Total Help needed moving to and from a bed to chair (including a wheelchair)?: A Lot Help needed walking in hospital room?: Total Help needed climbing 3-5 steps with a railing? : Total 6 Click Score: 7    End of Session Equipment Utilized During Treatment: Gait belt Activity Tolerance: Patient limited by pain Patient left: with call bell/phone within reach;in chair;with chair alarm set Nurse Communication: Mobility status;Patient requests pain meds PT Visit Diagnosis: Other abnormalities of gait and mobility (R26.89);Muscle weakness (generalized) (M62.81);Unsteadiness on feet (R26.81);Pain Pain - Right/Left: Right Pain - part of body: Leg     Time: 1205-1233 PT Time Calculation (min) (ACUTE ONLY): 28 min  Charges:  $Therapeutic Activity: 8-22 mins                    G Codes:       [facility] PT [place] 11/08/2016, 1:10 PM

## 2016-11-09 LAB — RENAL FUNCTION PANEL
ALBUMIN: 1.5 g/dL — AB (ref 3.5–5.0)
ANION GAP: 14 (ref 5–15)
BUN: 51 mg/dL — AB (ref 6–20)
CHLORIDE: 96 mmol/L — AB (ref 101–111)
CO2: 25 mmol/L (ref 22–32)
Calcium: 7.3 mg/dL — ABNORMAL LOW (ref 8.9–10.3)
Creatinine, Ser: 10.76 mg/dL — ABNORMAL HIGH (ref 0.61–1.24)
GFR calc Af Amer: 6 mL/min — ABNORMAL LOW (ref >60)
GFR, EST NON AFRICAN AMERICAN: 5 mL/min — AB (ref >60)
GLUCOSE: 100 mg/dL — AB (ref 65–99)
PHOSPHORUS: 7 mg/dL — AB (ref 2.5–4.6)
POTASSIUM: 2.9 mmol/L — AB (ref 3.5–5.1)
Sodium: 135 mmol/L (ref 135–145)

## 2016-11-09 LAB — GLUCOSE, CAPILLARY
GLUCOSE-CAPILLARY: 123 mg/dL — AB (ref 65–99)
GLUCOSE-CAPILLARY: 107 mg/dL — AB (ref 65–99)
Glucose-Capillary: 97 mg/dL (ref 65–99)
Glucose-Capillary: 100 mg/dL — ABNORMAL HIGH (ref 65–99)
Glucose-Capillary: 170 mg/dL — ABNORMAL HIGH (ref 65–99)

## 2016-11-09 LAB — GRAM STAIN: Gram Stain: NONE SEEN

## 2016-11-09 LAB — BODY FLUID CELL COUNT WITH DIFFERENTIAL: WBC FLUID: 5 uL (ref 0–1000)

## 2016-11-09 LAB — VANCOMYCIN, RANDOM: Vancomycin Rm: 14

## 2016-11-09 LAB — CBC
HCT: 26.8 % — ABNORMAL LOW (ref 39.0–52.0)
HEMOGLOBIN: 8.2 g/dL — AB (ref 13.0–17.0)
MCH: 24.4 pg — ABNORMAL LOW (ref 26.0–34.0)
MCHC: 30.6 g/dL (ref 30.0–36.0)
MCV: 79.8 fL (ref 78.0–100.0)
Platelets: 396 10*3/uL (ref 150–400)
RBC: 3.36 MIL/uL — ABNORMAL LOW (ref 4.22–5.81)
RDW: 20.2 % — ABNORMAL HIGH (ref 11.5–15.5)
WBC: 13.8 10*3/uL — AB (ref 4.0–10.5)

## 2016-11-09 MED ORDER — AMLODIPINE BESYLATE 5 MG PO TABS
5.0000 mg | ORAL_TABLET | Freq: Every day | ORAL | Status: DC
  Administered 2016-11-10 – 2016-11-14 (×4): 5 mg via ORAL
  Filled 2016-11-09 (×4): qty 1

## 2016-11-09 MED ORDER — PRAVASTATIN SODIUM 20 MG PO TABS
20.0000 mg | ORAL_TABLET | Freq: Every day | ORAL | Status: DC
  Administered 2016-11-10 – 2016-11-20 (×10): 20 mg via ORAL
  Filled 2016-11-09 (×11): qty 1

## 2016-11-09 MED ORDER — ALPRAZOLAM 0.5 MG PO TABS: 1.0000 mg | ORAL_TABLET | Freq: Every day | ORAL | Status: DC

## 2016-11-09 MED ORDER — DOCUSATE SODIUM 50 MG PO CAPS
50.0000 mg | ORAL_CAPSULE | Freq: Every day | ORAL | Status: DC
  Administered 2016-11-10 – 2016-11-11 (×2): 50 mg via ORAL
  Filled 2016-11-09 (×3): qty 1

## 2016-11-09 MED ORDER — ALPRAZOLAM 0.5 MG PO TABS
0.5000 mg | ORAL_TABLET | Freq: Every evening | ORAL | Status: DC | PRN
  Administered 2016-11-17: 0.5 mg via ORAL
  Filled 2016-11-09: qty 1

## 2016-11-09 MED ORDER — LORAZEPAM 2 MG/ML IJ SOLN
2.0000 mg | Freq: Once | INTRAMUSCULAR | Status: AC
  Administered 2016-11-09: 2 mg via INTRAVENOUS
  Filled 2016-11-09: qty 1

## 2016-11-09 MED ORDER — HYDROCODONE-ACETAMINOPHEN 5-325 MG PO TABS
1.0000 | ORAL_TABLET | Freq: Four times a day (QID) | ORAL | Status: DC | PRN
  Administered 2016-11-09 – 2016-11-14 (×6): 2 via ORAL
  Administered 2016-11-17: 1 via ORAL
  Administered 2016-11-18 – 2016-11-20 (×5): 2 via ORAL
  Filled 2016-11-09 (×11): qty 2
  Filled 2016-11-09: qty 1

## 2016-11-09 MED ORDER — POTASSIUM CHLORIDE CRYS ER 20 MEQ PO TBCR
40.0000 meq | EXTENDED_RELEASE_TABLET | Freq: Once | ORAL | Status: AC
  Administered 2016-11-09: 40 meq via ORAL
  Filled 2016-11-09: qty 2

## 2016-11-09 MED ORDER — ASPIRIN 81 MG PO CHEW
81.0000 mg | CHEWABLE_TABLET | Freq: Every day | ORAL | Status: DC
  Administered 2016-11-10 – 2016-11-20 (×10): 81 mg via ORAL
  Filled 2016-11-09 (×10): qty 1

## 2016-11-09 MED ORDER — ORAL CARE MOUTH RINSE
15.0000 mL | Freq: Two times a day (BID) | OROMUCOSAL | Status: DC
  Administered 2016-11-10 – 2016-11-18 (×13): 15 mL via OROMUCOSAL

## 2016-11-09 NOTE — Progress Notes (Signed)
   11/09/16 0800  Clinical Encounter Type  Visited With Patient;Patient and family together  Visit Type Psychological support;Spiritual support;Social support  Referral From Nurse  Consult/Referral To Chaplain;Faith community  Spiritual Encounters  Spiritual Needs Prayer;Emotional  Stress Factors  Patient Stress Factors Exhausted;Major life changes  Family Stress Factors Exhausted   Chaplain was called to comfort Pt due to his restlessness. Chaplain build relationship of care and support. Chaplain assisted Pt with finding purpose. [facility] also provided a calm presence to lessen the Pt's anxiety. Wife was at bedside. CH affirmed the Pt's faith.

## 2016-11-09 NOTE — Progress Notes (Signed)
[place] Progress Note Patient Name: Hayden [name] DOB: Sep 02, 1970 MRN: [id number]   Date of Service  11/09/2016  HPI/Events of Note  Repeated calls about anxiety in patient on scheduled xanax.  HD stable with sats of 94% on RA.    eICU Interventions  One time dose of 2 mg of ativan IV for anxiety/agitation     Intervention Category Intermediate Interventions: Other:  DETERDING,ELIZABETH 11/09/2016, 12:32 AM

## 2016-11-09 NOTE — Progress Notes (Signed)
PULMONARY / CRITICAL CARE MEDICINE   Name: Hayden [name] MRN: [id number] DOB: [birth year]    ADMISSION DATE:  10/23/2016 CONSULTATION DATE:  11/05/16  REFERRING MD:  Bridgett Larsson  CHIEF COMPLAINT:  Vent Management Post Op  BRIEF SUMMARY: Hayden [name] is a 46 y.o. M with PMH including ESRD on peritoneal dialysis. He was admitted 4/25 for dry gangrene and cellulitis on the right foot / right great toe.  He had been seen in the ED roughly 1 week prior to this was treated on vancomycin but ended up leaving AMA.  He was treated with empiric antibiotics but unfortunately failed conservative management.  Vascular surgery was consulted and arteriogram showed severe calcification of his entire arterial system with R SFA occlusion.  He was taken to the OR 5/8 for right CFA to below knee popliteal artery bypass, right first toe amputation, right dorsal ulcer debridement. Following the procedure, he returned to the ICU and remained on the ventilator. PCCM was called for management while in ICU and for possible extubation.  Of note, 5/7 patient was extremely agitated and combative and had altered mental status. Neurology was consulted and felt that etiology was likely metabolic versus questionable withdrawal.   SUBJECTIVE:  RN reports pt off precedex x24 hours.  Remains delirious but less agitated.  Wife at bedside.    VITAL SIGNS: BP (!) 158/78   Pulse 77   Temp 98 F (36.7 C) (Oral)   Resp (!) 25   Ht 5\' 10"  (1.778 m)   Wt 191 lb 5.8 oz (86.8 kg)   SpO2 99%   BMI 27.46 kg/m   HEMODYNAMICS:    VENTILATOR SETTINGS:    INTAKE / OUTPUT: I/O last 3 completed shifts: In: 16015.3 [P.O.:600; I.V.:433.3; EPPIR:51884] Out: 16606 [TKZSW:10932; Stool:1]  PHYSICAL EXAMINATION: General: Chronically ill-appearing adult male lying in bed, wife at bedside HEENT: MM pink/moist, no JVD, poor dentition PSY: Calm Neuro: Alert but not oriented, follows commands, MAE, speech clear CV: s1s2 rrr, no  m/r/g PULM: even/non-labored, lungs bilaterally clear TF:TDDU, non-tender, bsx4 active  Extremities: warm/dry, no edema.  Right great toe amputation with dressing, clean/dry/intact. Incisions to right lower extremity well approximated and without drainage. Mild erythema to right foot.  Skin: no rashes or lesions   LABS:  BMET  Recent Labs Lab 11/07/16 0728 11/08/16 0238 11/09/16 0525  NA 136 134* 135  K 3.3* 2.9* 2.9*  CL 95* 97* 96*  CO2 25 23 25   BUN 49* 51* 51*  CREATININE 11.35* 10.99* 10.76*  GLUCOSE 131* 135* 100*    Electrolytes  Recent Labs Lab 11/05/16 1510 11/05/16 1704 11/06/16 0529 11/07/16 0728 11/08/16 0238 11/09/16 0525  CALCIUM  --   --  7.2* 7.1* 7.0* 7.3*  MG 1.9 2.0 2.0  --   --   --   PHOS 7.6* 7.0* 7.6*  --  7.0* 7.0*    CBC  Recent Labs Lab 11/07/16 0728 11/08/16 0238 11/09/16 0525  WBC 18.3* 17.3* 13.8*  HGB 9.4* 9.1* 8.2*  HCT 30.4* 29.6* 26.8*  PLT 457* 439* 396    Coag's No results for input(s): APTT, INR in the last 168 hours.  Sepsis Markers No results for input(s): LATICACIDVEN, PROCALCITON, O2SATVEN in the last 168 hours.  ABG  Recent Labs Lab 11/05/16 1505 11/09/16 0335  PHART 7.457* 7.443  PCO2ART 32.6 33.8  PO2ART 143* 88.3    Liver Enzymes  Recent Labs Lab 11/04/16 1445 11/05/16 0504 11/08/16 0238 11/09/16 0525  AST 22  --   --   --  ALT 24  --   --   --   ALKPHOS 219*  --   --   --   BILITOT 0.6  --   --   --   ALBUMIN 2.2* 2.0* 1.6* 1.5*    Cardiac Enzymes  Recent Labs Lab 11/05/16 1510  TROPONINI 0.08*    Glucose  Recent Labs Lab 11/08/16 1129 11/08/16 1753 11/08/16 1933 11/08/16 2337 11/09/16 0306 11/09/16 0736  GLUCAP 145* 90 85 91 123* 97    Imaging No results found.   STUDIES:  Abdominal Aortogram 4/30 > no significant renal artery stenosis identified, no significant inflow disease, diffuse calcific disease throughout the infrainguinal arteries, occlusion of the  distal superficial femoral artery and above-knee popliteal artery over a length of approximately 15 cm, anterior tibial artery on the right is occluded, the dominant runoff is the peroneal artery which is diseased distally, posterior tibial artery has severe diffuse disease.  NM Stress Test 5/3 > EF 43%, inferior hypokinesis, reversible, medium sized, mild basal to mid inferior and basal inferolateral perfusion defect, concerning for possible ischemia, intermediate risk study.   ECHO 5/4 > LVEF 60-65%, compared to prior study in 2016, there is now severe LVH, severe concentric hypertrophy, no regional WMA, grade II diastolic dysfunction, moderate LAE, mild mitral stenosis and mild pulmonary hypertension with RSVP of 35 CT ABD / Pelvis >> markedly distended gallbladder with diffusely increased density in the gallbladder (acute cholecystitis versus poor functioning of the gallbladder filled with sludge), thickening of the second and third portions of the duodenum with surrounding edema in the adjacent fat (peptic ulcer disease versus duodenitis versus carcinoma), small bowel mildly distended with air fluid levels suggesting ileus or SBO, right lower lobe infiltrate, normal appendix EEG 5/7 >> unable to complete due to agitation    CULTURES: BCx2 4/25 > negative  Peritoneal fluid 5/11 >>  ANTIBIOTICS: Zosyn 4/25 > 5/10 Vancomycin 4/25 x1, 5/2 >>  SIGNIFICANT EVENTS: 4/25  Admit  5/07  Seen by Neurology & Psychiatry  5/08  To OR for right femoropopliteal vs femorotibial bypass & right great toe amputation  5/09  Extubated 5/11  Off precedex    LINES/TUBES: ETT 5/8 >>   DISCUSSION: 46 y/o M with PMH of former DM (resolved with weight loss), ESRD on PD, renal cancer s/p nephrectomy, HTN/CHF and PVD admitted on 4/25 with right foot redness and swelling after an injury approximately one month ago.  He was found to have RLE gangrene and BLE PAD.  Hospital course complicated by acute  encephalopathy/delirium.  Taken to OR 5/8 for right CFA to below knee popliteal artery bypass, right first toe amputation, right dorsal ulcer debridement. Course complicated by agitated delirium requiring precedex.    ASSESSMENT / PLAN:  PULMONARY A: S/P Acute Respiratory Failure - post operative amputation, RLE bypass.  P:   Pulmonary hygiene - IS, mobilize Wean O2 for sats > 90% Intermittent CXR   CARDIOVASCULAR A:  PVD s/p RLE bypass and R great toe amputation  Hx HTN, HLD P:  Continue Norvasc, ASA, clonidine patch, labetalol, Isordil Continue Pravachol  Monitor hemodynamics in ICU When necessary hydralazine for SBP >180  RENAL A:   CKD on Peritoneal Dialysis  AVF Placement - completed 12/2012 Hypokalemia  P:   Trend BMP Replace electrolytes as indicated, 40 KCL 5/12 Nightly PD  GASTROINTESTINAL A:   Protein Calorie Malnutrition  Hx Obesity  P:   Renal diet as tolerated   HEMATOLOGIC A:   Anemia -  suspect of chronic disease & acute illness  P:  Trend CBC Heparin for DVT prophylaxis Transfuse for hemoglobin less than 7%  INFECTIOUS A:   RLE Gangrene s/p R Great Toe Amputation - blood cultures negative on admit  Concern for peritonitis P:   Monitor fever curve/WBC Follow-up PD fluid culture Continue vancomycin, D11/x  ENDOCRINE A:   Prior DM - reportedly none since weight loss, 130lb weight loss P:   Sliding scale insulin   NEUROLOGIC A:   Main issue now is agitation 2/2 severe pain/neuropathy + Delirium + Withdrawal Acute Encephalopathy - suspect multifactorial in the setting of possible infection / gangrene with RLE, benzodiazepines / narcotics and poor renal clearance.  Hallucination / Anxiety / Depression  Seizure - noted 5/6-5/7 but not witnessed, concern for post ictal state with AMS Hx CVA P:   Xanax 1mg  BID Add back home HS xanax at reduced dose  Depakote 500 mg BID  PRN norco for pain  Continue Wellbutrin  Hold home Paxil  with prolonged QTc Consider transfer to floor vs SDU given delirium  FAMILY  - Updates:  Wife updated at bedside am 5/12.    - Inter-disciplinary family meet or Palliative Care meeting due by: 5/15   Hayden Gens, NP-C [facility] Pgr: [phone] or if no answer [phone] 11/09/2016, 9:46 AM

## 2016-11-09 NOTE — Progress Notes (Signed)
[facility] Progress Note   Subjective:  Getting better--> much less agitated today.  Wife at bedside  Objective Vitals:   11/09/16 0800 11/09/16 0802 11/09/16 0830 11/09/16 0900  BP: (!) 172/84 (!) 172/84 (!) 169/70 (!) 158/78  Pulse: 78 78 77 77  Resp: 20 (!) 24 19 (!) 25  Temp:      TempSrc:      SpO2: 95% 96% 98% 99%  Weight:      Height:       Physical Exam General: awake and alert, making sense this AM, able to carry on a conversation.  One hallucination about bees, but was able to be quickly redirected Heart: RRR; no murmur Lungs: bilateral coarse breath sounds, improving Abdomen: soft, non-tender. Extremities: NO LLE edema, RLE 1+ edema, surgical incisions well-approximated Dialysis Access: PD cath and L forearm AVF + thrill  Additional Objective Labs: Basic Metabolic Panel:  Recent Labs Lab 11/06/16 0529 11/07/16 0728 11/08/16 0238 11/09/16 0525  NA 139 136 134* 135  K 3.5 3.3* 2.9* 2.9*  CL 96* 95* 97* 96*  CO2 23 25 23 25   GLUCOSE 171* 131* 135* 100*  BUN 42* 49* 51* 51*  CREATININE 11.27* 11.35* 10.99* 10.76*  CALCIUM 7.2* 7.1* 7.0* 7.3*  PHOS 7.6*  --  7.0* 7.0*   Liver Function Tests:  Recent Labs Lab 11/04/16 1445 11/05/16 0504 11/08/16 0238 11/09/16 0525  AST 22  --   --   --   ALT 24  --   --   --   ALKPHOS 219*  --   --   --   BILITOT 0.6  --   --   --   PROT 6.0*  --   --   --   ALBUMIN 2.2* 2.0* 1.6* 1.5*   CBC:  Recent Labs Lab 11/05/16 0504 11/06/16 0529 11/07/16 0728 11/08/16 0238 11/09/16 0525  WBC 15.5* 17.2* 18.3* 17.3* 13.8*  HGB 10.7* 11.0* 9.4* 9.1* 8.2*  HCT 34.1* 35.6* 30.4* 29.6* 26.8*  MCV 80.2 80.0 79.6 80.4 79.8  PLT 535* 465* 457* 439* 396   CBG:  Recent Labs Lab 11/08/16 1753 11/08/16 1933 11/08/16 2337 11/09/16 0306 11/09/16 0736  GLUCAP 90 85 91 123* 97   Medications: . sodium chloride Stopped (11/05/16 1412)  . sodium chloride 500 mL (11/08/16 0400)  . dexmedetomidine  (PRECEDEX) IV infusion Stopped (11/08/16 1054)  . dialysis solution 1.5% low-MG/low-CA    . dialysis solution 2.5% low-MG/low-CA    . vancomycin 500 mg (11/09/16 1000)   . ALPRAZolam  1 mg Oral BID  . [START ON 11/10/2016] amLODipine  5 mg Oral Daily  . [START ON 11/10/2016] aspirin  81 mg Oral Daily  . budesonide (PULMICORT) nebulizer solution  0.5 mg Nebulization BID  . buPROPion  150 mg Oral Daily   And  . buPROPion  300 mg Oral QHS  . calcitRIOL  0.5 mcg Oral Daily  . chlorhexidine gluconate (MEDLINE KIT)  15 mL Mouth Rinse BID  . cinacalcet  60 mg Oral Q supper  . cloNIDine  0.2 mg Transdermal Weekly  . [START ON 11/10/2016] darbepoetin (ARANESP) injection - NON-DIALYSIS  200 mcg Subcutaneous Q Sun-1800  . divalproex  500 mg Oral Q12H  . [START ON 11/10/2016] docusate sodium  50 mg Oral Daily  . feeding supplement (PRO-STAT SUGAR FREE 64)  30 mL Oral BID  . gentamicin cream  1 application Topical Daily  . heparin  5,000 Units Subcutaneous Q8H  . insulin  aspart  0-9 Units Subcutaneous Q4H  . isosorbide dinitrate  5 mg Oral BID  . labetalol  200 mg Oral BID  . mouth rinse  15 mL Mouth Rinse QID  . pantoprazole  40 mg Oral Daily  . [START ON 11/10/2016] pravastatin  20 mg Oral Daily  . saccharomyces boulardii  250 mg Oral BID  . sodium chloride flush  3 mL Intravenous Q12H  . sucroferric oxyhydroxide  500 mg Oral TID WC    Dialysis Orders: [facility].  CCPD: 3L fill 5 exchanges at night with 5th fill manually drained 1-1/2 hours later. Uses mostly 1's and 2's at home.   Assessment/Plan: 1. Ischemic/gangrenous R great toe/Severe PAD: s/p OR with VVS.  Off Zosyn, continuing vanc 2. ESRD: Continue CCPD, will use 1.5% and 2.5% tonight again 3. Anemia: Hgb 10.2--> 9.1, continue Aranesp weekly (100 mcg last given 5/6), will increase to 200 mcg to be given Sunday 5/13. 4. Secondary hyperparathyroidism: Phos high, but coming down. Continue binders/VDRA/sensipar--> have put these  back on MAR today; have changed to non-calcium based binder given severe PAD, uses auryxia at home 5. HTN/volume: BP high, think agitation mediated but will use 1.5% and 2.5%. Follow BP. 6. Nutrition: Alb 2.1. Continue protein supps. 7. Abnormal myoview stress test: EF 43%. Reversible, medium sized, mild basal to mid inferior and basal inferolateral perfusion defect concerning for ischemia. Per cards, nuc medicine study is inaccurate, no plans for further investigation.   8. Acute encephalopathy: infection vs drugs vs intracranial process? Psych and neuro following.  He is still agitated but appears to be improving somewhat?  EEG nonspecific, CT/ MRI not able to be performed.  Possible benzo/ opiate withdrawal.  Doing a PD fluid cell ct and culture--> he has been on IV antibiotics for several days and his abd is not tender but still need to rule this out, this is pending.  Madelon Lips MD 11/09/2016, 10:35 AM  [facility] Pager: [phone]

## 2016-11-09 NOTE — Progress Notes (Addendum)
  Progress Note    11/09/2016 10:17 AM 4 Days Post-Op  Subjective:  Pt more alert this am and eating breakfast; complaining of pain  Tm 99 now afebrile HR 60's-70's NSR 275'T-700'F systolic 749% RA  Abx:  Vancomycin  Vitals:   11/09/16 0830 11/09/16 0900  BP: (!) 169/70 (!) 158/78  Pulse: 77 77  Resp: 19 (!) 25  Temp:      Physical Exam: Cardiac:  regular Lungs:  Non labored Incisions:  All are healing nicely; toe amp site is clean with sutures in tact. Extremities:  + right peroneal doppler signal; mild erythema on the dorsum of the foot; distal to the toe amp is an area of ischemia vs ecchymosis.   Neuro:  In tact and alert to person, place, past and present presidents.  CBC    Component Value Date/Time   WBC 13.8 (H) 11/09/2016 0525   RBC 3.36 (L) 11/09/2016 0525   HGB 8.2 (L) 11/09/2016 0525   HCT 26.8 (L) 11/09/2016 0525   PLT 396 11/09/2016 0525   MCV 79.8 11/09/2016 0525   MCH 24.4 (L) 11/09/2016 0525   MCHC 30.6 11/09/2016 0525   RDW 20.2 (H) 11/09/2016 0525   LYMPHSABS 0.9 11/01/2016 0425   MONOABS 1.5 (H) 11/01/2016 0425   EOSABS 1.7 (H) 11/01/2016 0425   BASOSABS 0.1 11/01/2016 0425    BMET    Component Value Date/Time   NA 135 11/09/2016 0525   K 2.9 (L) 11/09/2016 0525   CL 96 (L) 11/09/2016 0525   CO2 25 11/09/2016 0525   GLUCOSE 100 (H) 11/09/2016 0525   BUN 51 (H) 11/09/2016 0525   CREATININE 10.76 (H) 11/09/2016 0525   CALCIUM 7.3 (L) 11/09/2016 0525   GFRNONAA 5 (L) 11/09/2016 0525   GFRAA 6 (L) 11/09/2016 0525    INR    Component Value Date/Time   INR 1.24 10/30/2016 0614     Intake/Output Summary (Last 24 hours) at 11/09/16 1017 Last data filed at 11/09/16 0745  Gross per 24 hour  Intake            15342 ml  Output            14197 ml  Net             1145 ml     Assessment:  46 y.o. male is s/p:  1. Rightcommon femoral artery to below-the-knee popliteal artery bypass with non-reversed ipsilateral greater saphenous  vein  2. Right first ray amputation 3. Right dorsal ulcer debridement   4 Days Post-Op  Plan: -pt much more alert this morning -area on dorsum of foot is concerning-ischemia vs ecchymosis.  Will continue to monitor.  Leukocytosis has improved.  Tm of 99 but afebrile this am.  May be beneficial to continue vanc given mild erythema on dorsum of foot. -DVT prophylaxis:  SQ heparin    Leontine Locket, PA-C [facility] [phone] 11/09/2016 10:17 AM   I have interviewed patient with PA and agree with assessment and plan above.   Leinaala Catanese C. Donzetta Matters, MD [facility] [place] Office: [phone] Pager: [phone]

## 2016-11-09 NOTE — Progress Notes (Signed)
[facility] Progress Note Patient Name: Hayden [name] DOB: 1971-04-26 MRN: [id number]   Date of Service  11/09/2016  HPI/Events of Note  hypokalemia  eICU Interventions  Potassium replaced     Intervention Category Intermediate Interventions: Electrolyte abnormality - evaluation and management  DETERDING,ELIZABETH 11/09/2016, 6:13 AM

## 2016-11-09 NOTE — Progress Notes (Signed)
Pharmacy Antibiotic Note  Hayden [name] is a 46 y.o. male admitted on 10/23/2016 with right foot wound/cellulitis that failed to respond to outpatient dose of Dalvance 1500mg  IV on 10/10/16 and PO clindamycin. History of ESRD on PD at home. Pharmacy consulted to dose vancomycin.   The patient's last vancomycin random level on 5/7 AM was 17 mcg/ml and therapeutic. Patient was re-dosed with vancomycin at that time since the level is <20 mcg/ml. Random level this am is 14 so will redose again today and plan on rechecking level if needed later next week.  Vancomycin Doses and Levels: * Doses: 2g IV x1 on 4/25, 500mg  on 5/2, 500 mg ordered on 5/7 * Levels: VR 28 mcg/ml (4/28 @ 0330), 26 mcg/ml (4/29 @ 0500), 22 mcg/ml (5/1 @ 0530), 17 mcg/ml (5/7 @ 0900) 68mcg/ml (5/12@525 ) Plan: -Vancomycin 500 mg IV every 5 days starting on 5/12 -Will continue to follow HD schedule/duration, culture results, LOT, and antibiotic de-escalation plans   Height: 5\' 10"  (177.8 cm) Weight: 191 lb 5.8 oz (86.8 kg) IBW/kg (Calculated) : 73  Temp (24hrs), Avg:98.5 F (36.9 C), Min:97.6 F (36.4 C), Max:99.1 F (37.3 C)   Recent Labs Lab 11/04/16 0906 11/05/16 0504 11/06/16 0529 11/07/16 0728 11/08/16 0238 11/09/16 0525  WBC  --  15.5* 17.2* 18.3* 17.3* 13.8*  CREATININE  --  11.36* 11.27* 11.35* 10.99* 10.76*  VANCORANDOM 17  --   --   --   --  14    Estimated Creatinine Clearance: 9 mL/min (A) (by C-G formula based on SCr of 10.76 mg/dL (H)).    Allergies  Allergen Reactions  . Dilaudid [Hydromorphone Hcl] Other (See Comments)    ABNORMAL BEHAVIORS "VERBALLY AND PHYSICALLY ABUSIVE" PSYCHOSIS  . Morphine And Related Itching and Other (See Comments)    SYNCOPE    Dalvance 1500mg  IV once 10/10/16 at [facility] Zosyn 4/25 >>5/10 Vancomycin 4/25 >>   4/25 BCx: neg 4/26 MRSA PCR neg  Thank you for allowing pharmacy to be a part of this patient's care.  Erin Hearing PharmD.,  BCPS Clinical Pharmacist Pager [phone] 11/09/2016 10:03 AM

## 2016-11-10 LAB — BASIC METABOLIC PANEL WITH GFR
Anion gap: 14 (ref 5–15)
BUN: 51 mg/dL — ABNORMAL HIGH (ref 6–20)
CO2: 25 mmol/L (ref 22–32)
Calcium: 7.3 mg/dL — ABNORMAL LOW (ref 8.9–10.3)
Chloride: 95 mmol/L — ABNORMAL LOW (ref 101–111)
Creatinine, Ser: 11.02 mg/dL — ABNORMAL HIGH (ref 0.61–1.24)
GFR calc Af Amer: 6 mL/min — ABNORMAL LOW (ref >60)
GFR calc non Af Amer: 5 mL/min — ABNORMAL LOW (ref >60)
Glucose, Bld: 106 mg/dL — ABNORMAL HIGH (ref 65–99)
Potassium: 3 mmol/L — ABNORMAL LOW (ref 3.5–5.1)
Sodium: 134 mmol/L — ABNORMAL LOW (ref 135–145)

## 2016-11-10 LAB — CBC
HCT: 25.6 % — ABNORMAL LOW (ref 39.0–52.0)
Hemoglobin: 8.2 g/dL — ABNORMAL LOW (ref 13.0–17.0)
MCH: 25.6 pg — AB (ref 26.0–34.0)
MCHC: 32 g/dL (ref 30.0–36.0)
MCV: 80 fL (ref 78.0–100.0)
PLATELETS: 428 10*3/uL — AB (ref 150–400)
RBC: 3.2 MIL/uL — AB (ref 4.22–5.81)
RDW: 20.6 % — AB (ref 11.5–15.5)
WBC: 14.3 10*3/uL — ABNORMAL HIGH (ref 4.0–10.5)

## 2016-11-10 LAB — GLUCOSE, CAPILLARY
GLUCOSE-CAPILLARY: 102 mg/dL — AB (ref 65–99)
GLUCOSE-CAPILLARY: 92 mg/dL (ref 65–99)
Glucose-Capillary: 112 mg/dL — ABNORMAL HIGH (ref 65–99)
Glucose-Capillary: 94 mg/dL (ref 65–99)
Glucose-Capillary: 89 mg/dL (ref 65–99)
Glucose-Capillary: 117 mg/dL — ABNORMAL HIGH (ref 65–99)

## 2016-11-10 LAB — MAGNESIUM: Magnesium: 1.9 mg/dL (ref 1.7–2.4)

## 2016-11-10 LAB — PHOSPHORUS: Phosphorus: 6.3 mg/dL — ABNORMAL HIGH (ref 2.5–4.6)

## 2016-11-10 MED ORDER — FERRIC CITRATE 1 GM 210 MG(FE) PO TABS
420.0000 mg | ORAL_TABLET | Freq: Three times a day (TID) | ORAL | Status: DC
  Administered 2016-11-10 – 2016-11-20 (×25): 420 mg via ORAL
  Filled 2016-11-10 (×34): qty 2

## 2016-11-10 MED ORDER — POTASSIUM CHLORIDE CRYS ER 20 MEQ PO TBCR
40.0000 meq | EXTENDED_RELEASE_TABLET | Freq: Once | ORAL | Status: AC
  Administered 2016-11-10: 40 meq via ORAL
  Filled 2016-11-10: qty 2

## 2016-11-10 NOTE — Progress Notes (Signed)
MEDICATION RELATED NOTE   Pharmacy Re:  Ferric Citrate  Indication: Secondary Hyperparathyroidism  Assessment: FERRIC CITRATE 1 GM 210 MG(FE) PO TABS 420 mg 420 mg 2 tablet 200 Each Bottle      Order instructions: Inpatient use is restricted to continuation from therapy as prior to admission, or to patients with documented contraindication or intolerance to calcium-based binders, sevelamer, and lanthanum   Dr. Hollie Salk states in her note that this med is currently prescribed for patient prior to admit.  Plan:  Will obtain medication as ordered for this patient - as restrictions per P&T met.  Rober Minion, PharmD., MS Clinical Pharmacist Pager:  [phone] Thank you for allowing pharmacy to be part of this patients care team. 11/10/2016,8:00 AM

## 2016-11-10 NOTE — Progress Notes (Signed)
[facility] Progress Note Patient Name: Hayden [name] DOB: 09-May-1971 MRN: [id number]   Date of Service  11/10/2016  HPI/Events of Note  hypokalemia  eICU Interventions  Potassium replaced     Intervention Category Intermediate Interventions: Electrolyte abnormality - evaluation and management  Yamil Dougher 11/10/2016, 4:50 AM

## 2016-11-10 NOTE — Progress Notes (Signed)
PROGRESS NOTE    Hayden [name]  GQQ:[id number] DOB: 08-25-1970 DOA: 10/23/2016 PCP: Corliss Parish, MD    Brief Narrative:  46 y/o male with ESRD on PD, admitted on 4/25 with cellulitis and gangrene of right great toe. He was also noted to be encephalopathic. He was seen by vascular surgery and underwent right great toe amputation and right common femoral artery to below the knee popliteal artery bypass. He is continued on vancomycin. He is undergoing PD per nephrology. Hospital course has been complicated by encephalopathy and worsening delirium, needing precedex infusion. Since being started on precedex, his mental status has started to improve. He has been weaned off precedex and has been started on xanax. Will need physical therapy and possible placement.    Assessment & Plan:   Principal Problem:   Cellulitis of great toe of right foot Active Problems:   Hypertensive urgency, malignant   End stage renal disease on dialysis (HCC)   Anxiety and depression   Leukocytosis   Hyperkalemia   Pressure injury of skin   Gangrene of right foot (HCC)   QT prolongation   Hypokalemia   Agitation   Acute encephalopathy   Cellulitis of right lower extremity   Acute respiratory failure with hypoxia ([place])   1. Right foot cellulitis and gangrene of right great toe. s/p amputation of right great toe. Currently on intravenous antibiotics. Cultures have shown no growth. Would defer antibiotic course to vascular surgery 2. Peripheral vascular disease. s/p Rightcommon femoral artery to below-the-knee popliteal artery bypass with non-reversed ipsilateral greater saphenous vein. Vascular surgery is following. 3. Acute encephalopathy with history of depression and anxiety. Patient was noted to be encephalopathic on admission. He was seen by both neurology and psychiatry. It was felt that his encephalopathy may be related to his acute medical issues as well as possible withdrawal from  medications/alcohol. Antipsychotics were not recommended due to prolonged QT, so he was started on lorazepam and depakote. Mental status further deteriorated after surgery and has placed on a precedex infusion. Since that time he has been started on xanax. Mental status is improving. He is still having some visual hallucinations, but overall is better and is able to participate in conversation. Continue current treatments. 4. ESRD. Patient is currently on peritoneal dialysis. Nephrology following. 5. Anemia. No signs of bleeding. Likely chronic disease. Continue to monitor. 6. Sepsis. Culture data has been negative. Hemodynamics are stablizing. Continue current treatments.   DVT prophylaxis: heparin Code Status: full code Family Communication: discussed with wife at the bedside Disposition Plan: may need placement, will get physical therapy evaluation   Consultants:   PCCM  Vascular surgery  Nephrology for PD  Neurology  Psychiatry  Cardiology for pre-op clearance, stress test is low risk study  Procedures:  4/25 Admit  5/07 Seen by Neurology &Psychiatry  5/08 To OR for right femoropopliteal vs femorotibial bypass &right great toe amputation  5/09  Extubated 5/11  Off precedex  5/13 [facility] assumed care  Antimicrobials:  Zosyn 4/25 >5/10  Vancomycin 4/25 x1, 5/2 >>    Subjective: Feels like he is having visual hallucinations. Still has pain in right foot  Objective: Vitals:   11/10/16 1200 11/10/16 1300 11/10/16 1400 11/10/16 1500  BP: (!) 152/84 (!) 156/79 (!) 143/76 136/73  Pulse: 71 73 71 71  Resp: 15 12 14 16   Temp:      TempSrc:      SpO2: 95% 100% 98% 97%  Weight:      Height:  Intake/Output Summary (Last 24 hours) at 11/10/16 1552 Last data filed at 11/10/16 1300  Gross per 24 hour  Intake            15528 ml  Output            16562 ml  Net            -1034 ml   Filed Weights   11/09/16 0745 11/10/16 0300 11/10/16 0900  Weight: 86.8 kg  (191 lb 5.8 oz) 90.6 kg (199 lb 11.8 oz) 87.3 kg (192 lb 7.4 oz)    Examination:  General exam: Appears calm and comfortable  Respiratory system: Clear to auscultation. Respiratory effort normal. Cardiovascular system: S1 & S2 heard, RRR. No JVD, murmurs, rubs, gallops or clicks. No pedal edema. Gastrointestinal system: Abdomen is nondistended, soft and nontender. No organomegaly or masses felt. Normal bowel sounds heard. Central nervous system: Alert and oriented. No focal neurological deficits. Extremities: Symmetric 5 x 5 power. Skin: right foot is tender to touch, dressing in place. Erythema over dorsum of foot Psychiatry: feels like he is having visual hallucinations    Data Reviewed: I have personally reviewed following labs and imaging studies  CBC:  Recent Labs Lab 11/06/16 0529 11/07/16 0728 11/08/16 0238 11/09/16 0525 11/10/16 0239  WBC 17.2* 18.3* 17.3* 13.8* 14.3*  HGB 11.0* 9.4* 9.1* 8.2* 8.2*  HCT 35.6* 30.4* 29.6* 26.8* 25.6*  MCV 80.0 79.6 80.4 79.8 80.0  PLT 465* 457* 439* 396 163*   Basic Metabolic Panel:  Recent Labs Lab 11/05/16 1510 11/05/16 1704 11/06/16 0529 11/07/16 0728 11/08/16 0238 11/09/16 0525 11/10/16 0239  NA  --   --  139 136 134* 135 134*  K  --   --  3.5 3.3* 2.9* 2.9* 3.0*  CL  --   --  96* 95* 97* 96* 95*  CO2  --   --  23 25 23 25 25   GLUCOSE  --   --  171* 131* 135* 100* 106*  BUN  --   --  42* 49* 51* 51* 51*  CREATININE  --   --  11.27* 11.35* 10.99* 10.76* 11.02*  CALCIUM  --   --  7.2* 7.1* 7.0* 7.3* 7.3*  MG 1.9 2.0 2.0  --   --   --  1.9  PHOS 7.6* 7.0* 7.6*  --  7.0* 7.0* 6.3*   GFR: Estimated Creatinine Clearance: 8.8 mL/min (A) (by C-G formula based on SCr of 11.02 mg/dL (H)). Liver Function Tests:  Recent Labs Lab 11/04/16 0508 11/04/16 1445 11/05/16 0504 11/08/16 0238 11/09/16 0525  AST  --  22  --   --   --   ALT  --  24  --   --   --   ALKPHOS  --  219*  --   --   --   BILITOT  --  0.6  --   --    --   PROT  --  6.0*  --   --   --   ALBUMIN 2.1* 2.2* 2.0* 1.6* 1.5*   No results for input(s): LIPASE, AMYLASE in the last 168 hours.  Recent Labs Lab 11/04/16 1223  AMMONIA 21   Coagulation Profile: No results for input(s): INR, PROTIME in the last 168 hours. Cardiac Enzymes:  Recent Labs Lab 11/05/16 1510  TROPONINI 0.08*   BNP (last 3 results) No results for input(s): PROBNP in the last 8760 hours. HbA1C: No results for input(s): HGBA1C in the last  72 hours. CBG:  Recent Labs Lab 11/09/16 2016 11/09/16 2335 11/10/16 0336 11/10/16 0825 11/10/16 1234  GLUCAP 170* 102* 112* 94 89   Lipid Profile: No results for input(s): CHOL, HDL, LDLCALC, TRIG, CHOLHDL, LDLDIRECT in the last 72 hours. Thyroid Function Tests: No results for input(s): TSH, T4TOTAL, FREET4, T3FREE, THYROIDAB in the last 72 hours. Anemia Panel: No results for input(s): VITAMINB12, FOLATE, FERRITIN, TIBC, IRON, RETICCTPCT in the last 72 hours. Sepsis Labs: No results for input(s): PROCALCITON, LATICACIDVEN in the last 168 hours.  Recent Results (from the past 240 hour(s))  Culture, body fluid-bottle     Status: None (Preliminary result)   Collection Time: 11/09/16  8:36 AM  Result Value Ref Range Status   Specimen Description FLUID PERITONEAL DIALYSIS  Final   Special Requests NONE  Final   Culture NO GROWTH 1 DAY  Final   Report Status PENDING  Incomplete  Gram stain     Status: None   Collection Time: 11/09/16  8:36 AM  Result Value Ref Range Status   Specimen Description FLUID PERITONEAL DIALYSIS  Final   Special Requests NONE  Final   Gram Stain NO WBC SEEN NO ORGANISMS SEEN   Final   Report Status 11/09/2016 FINAL  Final         Radiology Studies: No results found.      Scheduled Meds: . ALPRAZolam  1 mg Oral BID  . amLODipine  5 mg Oral Daily  . aspirin  81 mg Oral Daily  . budesonide (PULMICORT) nebulizer solution  0.5 mg Nebulization BID  . buPROPion  150 mg Oral  Daily   And  . buPROPion  300 mg Oral QHS  . calcitRIOL  0.5 mcg Oral Daily  . cinacalcet  60 mg Oral Q supper  . cloNIDine  0.2 mg Transdermal Weekly  . darbepoetin (ARANESP) injection - NON-DIALYSIS  200 mcg Subcutaneous Q Sun-1800  . divalproex  500 mg Oral Q12H  . docusate sodium  50 mg Oral Daily  . feeding supplement (PRO-STAT SUGAR FREE 64)  30 mL Oral BID  . ferric citrate  420 mg Oral TID WC  . gentamicin cream  1 application Topical Daily  . heparin  5,000 Units Subcutaneous Q8H  . insulin aspart  0-9 Units Subcutaneous Q4H  . isosorbide dinitrate  5 mg Oral BID  . labetalol  200 mg Oral BID  . mouth rinse  15 mL Mouth Rinse BID  . pantoprazole  40 mg Oral Daily  . pravastatin  20 mg Oral Daily  . saccharomyces boulardii  250 mg Oral BID  . sodium chloride flush  3 mL Intravenous Q12H   Continuous Infusions: . sodium chloride Stopped (11/05/16 1412)  . dialysis solution 1.5% low-MG/low-CA    . dialysis solution 2.5% low-MG/low-CA    . vancomycin Stopped (11/09/16 1100)     LOS: 17 days    Time spent: 33mins    MEMON,JEHANZEB, MD [name] Pager [phone]  If 7PM-7AM, please contact night-coverage [email or web address] Password [email or web address] 11/10/2016, 3:52 PM

## 2016-11-10 NOTE — Progress Notes (Signed)
[facility] Progress Note   Subjective:  Alert today, knows why he's in the hospital.  Objective Vitals:   11/10/16 0700 11/10/16 0759 11/10/16 0800 11/10/16 0900  BP: (!) 177/79  (!) 158/78 (!) 158/79  Pulse: 75  73 72  Resp: 17  14 13   Temp:   98.6 F (37 C)   TempSrc:   Axillary   SpO2: 100% 98% 97% 99%  Weight:      Height:       Physical Exam General: awake and alert, able to carry on full conversation Heart: RRR; no murmur Lungs: bilateral coarse breath sounds, improving Abdomen: soft, non-tender. Extremities: NO LLE edema, RLE 1+ edema, surgical incisions well-approximated Dialysis Access: PD cath and L forearm AVF + thrill  Additional Objective Labs: Basic Metabolic Panel:  Recent Labs Lab 11/08/16 0238 11/09/16 0525 11/10/16 0239  NA 134* 135 134*  K 2.9* 2.9* 3.0*  CL 97* 96* 95*  CO2 23 25 25   GLUCOSE 135* 100* 106*  BUN 51* 51* 51*  CREATININE 10.99* 10.76* 11.02*  CALCIUM 7.0* 7.3* 7.3*  PHOS 7.0* 7.0* 6.3*   Liver Function Tests:  Recent Labs Lab 11/04/16 1445 11/05/16 0504 11/08/16 0238 11/09/16 0525  AST 22  --   --   --   ALT 24  --   --   --   ALKPHOS 219*  --   --   --   BILITOT 0.6  --   --   --   PROT 6.0*  --   --   --   ALBUMIN 2.2* 2.0* 1.6* 1.5*   CBC:  Recent Labs Lab 11/06/16 0529 11/07/16 0728 11/08/16 0238 11/09/16 0525 11/10/16 0239  WBC 17.2* 18.3* 17.3* 13.8* 14.3*  HGB 11.0* 9.4* 9.1* 8.2* 8.2*  HCT 35.6* 30.4* 29.6* 26.8* 25.6*  MCV 80.0 79.6 80.4 79.8 80.0  PLT 465* 457* 439* 396 428*   CBG:  Recent Labs Lab 11/09/16 1523 11/09/16 2016 11/09/16 2335 11/10/16 0336 11/10/16 0825  GLUCAP 100* 170* 102* 112* 94   Medications: . sodium chloride Stopped (11/05/16 1412)  . dialysis solution 1.5% low-MG/low-CA    . dialysis solution 2.5% low-MG/low-CA    . vancomycin Stopped (11/09/16 1100)   . ALPRAZolam  1 mg Oral BID  . amLODipine  5 mg Oral Daily  . aspirin  81 mg Oral Daily   . budesonide (PULMICORT) nebulizer solution  0.5 mg Nebulization BID  . buPROPion  150 mg Oral Daily   And  . buPROPion  300 mg Oral QHS  . calcitRIOL  0.5 mcg Oral Daily  . cinacalcet  60 mg Oral Q supper  . cloNIDine  0.2 mg Transdermal Weekly  . darbepoetin (ARANESP) injection - NON-DIALYSIS  200 mcg Subcutaneous Q Sun-1800  . divalproex  500 mg Oral Q12H  . docusate sodium  50 mg Oral Daily  . feeding supplement (PRO-STAT SUGAR FREE 64)  30 mL Oral BID  . ferric citrate  420 mg Oral TID WC  . gentamicin cream  1 application Topical Daily  . heparin  5,000 Units Subcutaneous Q8H  . insulin aspart  0-9 Units Subcutaneous Q4H  . isosorbide dinitrate  5 mg Oral BID  . labetalol  200 mg Oral BID  . mouth rinse  15 mL Mouth Rinse BID  . pantoprazole  40 mg Oral Daily  . pravastatin  20 mg Oral Daily  . saccharomyces boulardii  250 mg Oral BID  . sodium chloride flush  3 mL Intravenous Q12H    Dialysis Orders: [facility].  CCPD: 3L fill 5 exchanges at night with 5th fill manually drained 1-1/2 hours later. Uses mostly 1's and 2's at home.   Assessment/Plan: 1. Ischemic/gangrenous R great toe/Severe PAD: s/p OR with VVS 5/8, s/p fem/pop bypass and R great toe amp.  Off Zosyn, continuing vanc 2. ESRD: Continue CCPD, will use 1.5% and 2.5% based on BPs 3. Anemia: Hgb 10.2--> 9.1, continue Aranesp weekly (100 mcg last given 5/6), will increase to 200 mcg to be given Sunday 5/13. 4. Secondary hyperparathyroidism: Phos high, but coming down. Continue binders/VDRA/sensipar--> have put these back on MAR, have changed to non-calcium based binder given severe PAD, uses auryxia at home 5. HTN/volume: BP high, Back on home BP meds, will follow 6. Nutrition: Alb 2.1. Continue protein supps. 7. Abnormal myoview stress test: EF 43%. Reversible, medium sized, mild basal to mid inferior and basal inferolateral perfusion defect concerning for ischemia. Per cards, nuc medicine study is  inaccurate, no plans for further investigation.   8. Acute encephalopathy: improving.  infection vs drugs vs intracranial process? Psych and neuro following.  He is still agitated but appears to be improving somewhat?  EEG nonspecific, CT/ MRI not able to be performed.  Possible benzo/ opiate withdrawal. PD cell ct and culture not reflective of peritonitis  Madelon Lips MD 11/10/2016, 9:33 AM  [facility] Pager: [phone]

## 2016-11-10 NOTE — Progress Notes (Signed)
  Progress Note    11/10/2016 11:04 AM 5 Days Post-Op  Subjective:  Complains of pain, feeling mentally clearer  Vitals:   11/10/16 0900 11/10/16 1000  BP: (!) 158/79 (!) 144/78  Pulse: 72 72  Resp: 13 15  Temp:      Physical Exam: Oriented x3  right dp signal Incisions cdi Right foot amp site with area of dusky change  CBC    Component Value Date/Time   WBC 14.3 (H) 11/10/2016 0239   RBC 3.20 (L) 11/10/2016 0239   HGB 8.2 (L) 11/10/2016 0239   HCT 25.6 (L) 11/10/2016 0239   PLT 428 (H) 11/10/2016 0239   MCV 80.0 11/10/2016 0239   MCH 25.6 (L) 11/10/2016 0239   MCHC 32.0 11/10/2016 0239   RDW 20.6 (H) 11/10/2016 0239   LYMPHSABS 0.9 11/01/2016 0425   MONOABS 1.5 (H) 11/01/2016 0425   EOSABS 1.7 (H) 11/01/2016 0425   BASOSABS 0.1 11/01/2016 0425    BMET    Component Value Date/Time   NA 134 (L) 11/10/2016 0239   K 3.0 (L) 11/10/2016 0239   CL 95 (L) 11/10/2016 0239   CO2 25 11/10/2016 0239   GLUCOSE 106 (H) 11/10/2016 0239   BUN 51 (H) 11/10/2016 0239   CREATININE 11.02 (H) 11/10/2016 0239   CALCIUM 7.3 (L) 11/10/2016 0239   GFRNONAA 5 (L) 11/10/2016 0239   GFRAA 6 (L) 11/10/2016 0239    INR    Component Value Date/Time   INR 1.24 10/30/2016 0614     Intake/Output Summary (Last 24 hours) at 11/10/16 1104 Last data filed at 11/10/16 1000  Gross per 24 hour  Intake           984.89 ml  Output                0 ml  Net           984.89 ml     Assessment:  46 y.o. male is s/p:  1. Rightcommon femoral artery to below-the-knee popliteal artery bypass with non-reversed ipsilateral greater saphenous vein  2. Right first ray amputation 3. Right dorsal ulcer debridement  5 Days Post-Op  Plan: Improved from neuro standpoint Persistent leukocytosis, amputation site declaring DVT prophylaxis:  SQ heparin     Glady Ouderkirk C. Donzetta Matters, MD [facility] Office: [phone] Pager: [phone]  11/10/2016 11:04 AM

## 2016-11-11 LAB — PATHOLOGIST SMEAR REVIEW

## 2016-11-11 LAB — CBC
HEMATOCRIT: 26.1 % — AB (ref 39.0–52.0)
HEMOGLOBIN: 8.4 g/dL — AB (ref 13.0–17.0)
MCH: 25.6 pg — AB (ref 26.0–34.0)
MCHC: 32.2 g/dL (ref 30.0–36.0)
MCV: 79.6 fL (ref 78.0–100.0)
Platelets: 441 10*3/uL — ABNORMAL HIGH (ref 150–400)
RBC: 3.28 MIL/uL — ABNORMAL LOW (ref 4.22–5.81)
RDW: 20.7 % — ABNORMAL HIGH (ref 11.5–15.5)
WBC: 13.3 10*3/uL — ABNORMAL HIGH (ref 4.0–10.5)

## 2016-11-11 LAB — BLOOD GAS, ARTERIAL
ACID-BASE DEFICIT: 0.8 mmol/L (ref 0.0–2.0)
Bicarbonate: 22.8 mmol/L (ref 20.0–28.0)
Drawn by: 44135
FIO2: 21
O2 Saturation: 96.8 %
PCO2 ART: 33.8 mmHg (ref 32.0–48.0)
PH ART: 7.443 (ref 7.350–7.450)
Patient temperature: 98.6
pO2, Arterial: 88.3 mmHg (ref 83.0–108.0)

## 2016-11-11 LAB — GLUCOSE, CAPILLARY
GLUCOSE-CAPILLARY: 102 mg/dL — AB (ref 65–99)
GLUCOSE-CAPILLARY: 111 mg/dL — AB (ref 65–99)
GLUCOSE-CAPILLARY: 235 mg/dL — AB (ref 65–99)
Glucose-Capillary: 60 mg/dL — ABNORMAL LOW (ref 65–99)
Glucose-Capillary: 97 mg/dL (ref 65–99)
Glucose-Capillary: 61 mg/dL — ABNORMAL LOW (ref 65–99)
Glucose-Capillary: 74 mg/dL (ref 65–99)

## 2016-11-11 LAB — BASIC METABOLIC PANEL
Anion gap: 14 (ref 5–15)
BUN: 52 mg/dL — AB (ref 6–20)
CHLORIDE: 96 mmol/L — AB (ref 101–111)
CO2: 24 mmol/L (ref 22–32)
CREATININE: 10.58 mg/dL — AB (ref 0.61–1.24)
Calcium: 7.3 mg/dL — ABNORMAL LOW (ref 8.9–10.3)
GFR calc Af Amer: 6 mL/min — ABNORMAL LOW (ref >60)
GFR calc non Af Amer: 5 mL/min — ABNORMAL LOW (ref >60)
GLUCOSE: 105 mg/dL — AB (ref 65–99)
Potassium: 3.3 mmol/L — ABNORMAL LOW (ref 3.5–5.1)
Sodium: 134 mmol/L — ABNORMAL LOW (ref 135–145)

## 2016-11-11 MED ORDER — LORAZEPAM 2 MG/ML IJ SOLN
0.5000 mg | Freq: Once | INTRAMUSCULAR | Status: AC
  Administered 2016-11-11: 0.5 mg via INTRAVENOUS
  Filled 2016-11-11: qty 1

## 2016-11-11 MED ORDER — GERHARDT'S BUTT CREAM
TOPICAL_CREAM | CUTANEOUS | Status: DC | PRN
  Administered 2016-11-13: 09:00:00 via TOPICAL
  Filled 2016-11-11: qty 1

## 2016-11-11 MED ORDER — LORAZEPAM 2 MG/ML IJ SOLN
1.0000 mg | Freq: Once | INTRAMUSCULAR | Status: AC
  Administered 2016-11-11: 1 mg via INTRAVENOUS

## 2016-11-11 MED ORDER — LORAZEPAM 2 MG/ML IJ SOLN
INTRAMUSCULAR | Status: AC
  Filled 2016-11-11: qty 1
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
## 2016-11-11 NOTE — Progress Notes (Signed)
[facility]                                                                              Patient Demographics  Hayden [name], is a 46 y.o. male, DOB - 1971-05-17, NLZ:[id number]  Admit date - 10/23/2016   Admitting Physician Norval Morton, MD  Outpatient Primary MD for the patient is Corliss Parish, MD  Outpatient specialists:   LOS - 18  days    Chief Complaint  Patient presents with  . Foot Injury       Brief summary   46 y/o male with ESRD on PD, admitted on 4/25 with cellulitis and gangrene of right great toe. He was also noted to be encephalopathic. He was seen by vascular surgery and underwent right great toe amputation and right common femoral artery to below the knee popliteal artery bypass. He is continued on vancomycin. He is undergoing PD per nephrology. Hospital course has been complicated by encephalopathy and worsening delirium, needing precedex infusion. Since being started on precedex, his mental status has started to improve. He has been weaned off precedex and has been started on xanax. Will need physical therapy and possible placement.    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 - Status post amputation of the right great toe - Currently on IV antibiotics - Cultures with no growth - Vascular surgery following closely, defer antibiotic course to assess for surgery  Active Problems: Peripheral vascular disease - Status post right common femoral artery to below the knee popliteal artery bypass with non-reversed ipsilateral greater saphenous vein - Vascular surgery following closely    Hypertensive urgency, malignant - Currently BP stable, continue labetalol, isosorbide, clonidine patch, amlodipine, hydralazine as needed    End stage renal disease on dialysis [facility]) - Patient on peritoneal dialysis, nephrology following  Acute encephalopathy, underlying history of depression and  anxiety - Patient has been seen by neurology and psychiatry during this admission, was noted to be encephalopathic. It was felt that his encephalopathy may be related to his acute medical issues as well as possible withdrawal from medications/alcohol. - Antipsychotics were not recommended due to prolonged QTC so he was started on Depakote and lorazepam. After the surgery encephalopathy worsened and the patient was transferred to ICU and placed on Precedex infusion. - Currently off Precedex and on Xanax, mental status is improving, confirmed with wife at the bedside - Continue Wellbutrin XL, Depakote    Anemia of chronic disease - H&H currently stable  Hyperlipidemia - Continue statin  GERD Continue PPI  Diabetes mellitus type 2 - Continue sliding scale insulin  Code Status: Full CODE STATUS DVT Prophylaxis: heparin  Family Communication: Discussed in detail with the patient, all imaging results, lab results explained to the patient and wife at the bedside   Disposition Plan:   Time Spent in minutes  25 minutes  Procedures:  4/25 Admit  5/07 Seen by Neurology &Psychiatry  5/08 To OR for right femoropopliteal vs femorotibial bypass &right great toe amputation  5/09 Extubated 5/11 Off precedex  5/13 [facility] assumed care  Consultants:  PCCM  Vascular surgery  Nephrology for PD  Neurology  Psychiatry  Cardiology for pre-op clearance, stress test is low risk study  Antimicrobials:        Vancomycin 4/25 x1, 5/2 >>   Zosyn 4/25 >5/10   Medications  Scheduled Meds: . ALPRAZolam  1 mg Oral BID  . amLODipine  5 mg Oral Daily  . aspirin  81 mg Oral Daily  . budesonide (PULMICORT) nebulizer solution  0.5 mg Nebulization BID  . buPROPion  150 mg Oral Daily   And  . buPROPion  300 mg Oral QHS  . calcitRIOL  0.5 mcg Oral Daily  . cinacalcet  60 mg Oral Q supper  . cloNIDine  0.2 mg Transdermal Weekly  . darbepoetin (ARANESP) injection - NON-DIALYSIS  200  mcg Subcutaneous Q Sun-1800  . divalproex  500 mg Oral Q12H  . docusate sodium  50 mg Oral Daily  . feeding supplement (PRO-STAT SUGAR FREE 64)  30 mL Oral BID  . ferric citrate  420 mg Oral TID WC  . gentamicin cream  1 application Topical Daily  . heparin  5,000 Units Subcutaneous Q8H  . insulin aspart  0-9 Units Subcutaneous Q4H  . isosorbide dinitrate  5 mg Oral BID  . labetalol  200 mg Oral BID  . mouth rinse  15 mL Mouth Rinse BID  . pantoprazole  40 mg Oral Daily  . pravastatin  20 mg Oral Daily  . saccharomyces boulardii  250 mg Oral BID  . sodium chloride flush  3 mL Intravenous Q12H   Continuous Infusions: . sodium chloride Stopped (11/05/16 1412)  . dialysis solution 1.5% low-MG/low-CA    . dialysis solution 2.5% low-MG/low-CA    . vancomycin Stopped (11/09/16 1100)   PRN Meds:.sodium chloride, ALPRAZolam, fentaNYL (SUBLIMAZE) injection, Gerhardt's butt cream, dianeal solution for CAPD/CCPD with heparin, hydrALAZINE, HYDROcodone-acetaminophen, prochlorperazine   Antibiotics   Anti-infectives    Start     Dose/Rate Route Frequency Ordered Stop   11/09/16 1000  vancomycin (VANCOCIN) 500 mg in sodium chloride 0.9 % 100 mL IVPB     500 mg 100 mL/hr over 60 Minutes Intravenous Once every 5 days 11/04/16 1326     11/05/16 0800  piperacillin-tazobactam (ZOSYN) IVPB 2.25 g  Status:  Discontinued     2.25 g 100 mL/hr over 30 Minutes Intravenous To Surgery 11/05/16 0747 11/05/16 1413   11/04/16 2200  piperacillin-tazobactam (ZOSYN) IVPB 2.25 g  Status:  Discontinued     2.25 g 100 mL/hr over 30 Minutes Intravenous Every 12 hours 11/04/16 1507 11/07/16 1011   11/04/16 1400  vancomycin (VANCOCIN) 500 mg in sodium chloride 0.9 % 100 mL IVPB     500 mg 100 mL/hr over 60 Minutes Intravenous  Once 11/04/16 1317 11/04/16 1615   10/31/16 2000  piperacillin-tazobactam (ZOSYN) IVPB 3.375 g  Status:  Discontinued     3.375 g 12.5 mL/hr over 240 Minutes Intravenous Every 12 hours  10/31/16 0842 11/04/16 1507   10/30/16 1400  vancomycin (VANCOCIN) 500 mg in sodium chloride 0.9 % 100 mL IVPB  Status:  Discontinued     500 mg 100 mL/hr over 60 Minutes Intravenous Once every 6 days 10/30/16 1306 11/04/16 1317   10/30/16 1000  vancomycin (VANCOCIN) 500 mg in sodium chloride 0.9 % 100 mL IVPB  Status:  Discontinued     500 mg 100 mL/hr over 60 Minutes Intravenous Once every 6 days 10/29/16 1114 10/30/16 1306   10/24/16 0600  piperacillin-tazobactam (ZOSYN)  IVPB 3.375 g  Status:  Discontinued     3.375 g 12.5 mL/hr over 240 Minutes Intravenous Every 12 hours 10/23/16 1854 10/31/16 0842   10/23/16 1845  piperacillin-tazobactam (ZOSYN) IVPB 3.375 g     3.375 g 100 mL/hr over 30 Minutes Intravenous  Once 10/23/16 1837 10/23/16 1928   10/23/16 1845  vancomycin (VANCOCIN) 2,000 mg in sodium chloride 0.9 % 500 mL IVPB     2,000 mg 250 mL/hr over 120 Minutes Intravenous  Once 10/23/16 1842 10/23/16 2131        Subjective:   Hayden [name] was seen and examined today.  Pain in the right foot otherwise feeling better today, wife at the bedside. No acute events overnight.  Afebrile. States he feels okay. Denies any chest pain, shortness of breath, nausea, vomiting or abdominal pain.  Objective:   Vitals:   11/11/16 0400 11/11/16 0853 11/11/16 0923 11/11/16 0935  BP:  (!) 164/79 133/84 133/84  Pulse:  78 77 100  Resp: 16 16 (!) 29   Temp:  97.9 F (36.6 C) 98.1 F (36.7 C)   TempSrc:  Oral Oral   SpO2:      Weight:      Height:        Intake/Output Summary (Last 24 hours) at 11/11/16 1041 Last data filed at 11/11/16 0853  Gross per 24 hour  Intake            15102 ml  Output            16590 ml  Net            -1488 ml     Wt Readings from Last 3 Encounters:  11/10/16 87.3 kg (192 lb 7.4 oz)  07/12/16 99.8 kg (220 lb)  11/18/15 101.1 kg (222 lb 14.2 oz)     Exam  General: Alert and oriented x 2, NAD  HEENT:  PERRLA, EOMI, Anicteric Sclera, mucous  membranes moist.   Neck: Supple, no JVD  Cardiovascular: S1 S2 auscultated, no rubs, murmurs or gallops. Regular rate and rhythm.  Respiratory: Clear to auscultation bilaterally, no wheezing, rales or rhonchi  Gastrointestinal: Soft, nontender, nondistended, + bowel sounds, PD catheter+  Ext: no cyanosis clubbing or edema  Neuro: no new deficits  Skin: Right foot tender to catch with erythema over the dorsum of the foot, staples intact on the leg  Psych: pleasant, alert and oriented x2   Data Reviewed:  I have personally reviewed following labs and imaging studies  Micro Results Recent Results (from the past 240 hour(s))  Culture, body fluid-bottle     Status: None (Preliminary result)   Collection Time: 11/09/16  8:36 AM  Result Value Ref Range Status   Specimen Description FLUID PERITONEAL DIALYSIS  Final   Special Requests NONE  Final   Culture NO GROWTH 1 DAY  Final   Report Status PENDING  Incomplete  Gram stain     Status: None   Collection Time: 11/09/16  8:36 AM  Result Value Ref Range Status   Specimen Description FLUID PERITONEAL DIALYSIS  Final   Special Requests NONE  Final   Gram Stain NO WBC SEEN NO ORGANISMS SEEN   Final   Report Status 11/09/2016 FINAL  Final    Radiology Reports Nm Myocar Multi W/spect W/wall Motion / Ef  Result Date: 10/31/2016  There was no ST segment deviation noted during stress.  Findings consistent with ischemia.  This is an intermediate risk study.  The left  ventricular ejection fraction is mildly decreased (45-54%).  1. EF 43%, inferior hypokinesis. 2. Reversible, medium sized, mild basal to mid inferior and basal inferolateral perfusion defect.  This is concerning for possible ischemia. 3. Intermediate risk study.   Dg Chest Port 1 View  Result Date: 11/06/2016 CLINICAL DATA:  Respiratory failure.  Endotracheal tube present. EXAM: PORTABLE CHEST 1 VIEW COMPARISON:  11/05/2016 FINDINGS: Endotracheal tube is 5.2 cm above the  carina. Nasogastric tube extends into the abdomen. Low lung volumes without focal airspace disease. Negative for a pneumothorax. Heart size is within normal limits. Atherosclerotic calcifications at the aortic arch. IMPRESSION: Low lung volumes without focal lung disease. Support apparatuses as described. Electronically Signed   By: Markus Daft M.D.   On: 11/06/2016 07:42   Dg Chest Port 1 View  Result Date: 11/05/2016 CLINICAL DATA:  Hypoxia EXAM: PORTABLE CHEST 1 VIEW COMPARISON:  December 29, 2015 FINDINGS: Endotracheal tube tip is 2.1 cm above the carina. No pneumothorax. There is no edema or consolidation. Heart is upper normal in size with pulmonary vascularity within normal limits. No adenopathy. There is aortic atherosclerosis. No bone lesions. IMPRESSION: Endotracheal tube as described without evident pneumothorax. No edema or consolidation. There is aortic atherosclerosis. Electronically Signed   By: Lowella Grip III M.D.   On: 11/05/2016 15:44   Dg Abd Portable 1v  Result Date: 11/05/2016 CLINICAL DATA:  Encounter for orogastric tube placement. EXAM: PORTABLE ABDOMEN - 1 VIEW COMPARISON:  11/15/2015 FINDINGS: There is gas within the stomach. Orogastric tube is within the stomach body region. Nonobstructive bowel gas pattern. There is a peritoneal dialysis catheter which is partially visualized. IMPRESSION: Orogastric tube in the stomach. Electronically Signed   By: Markus Daft M.D.   On: 11/05/2016 16:02   Dg Foot Complete Right  Result Date: 10/23/2016 CLINICAL DATA:  Recent foot injury 2 weeks ago with persistent pain and discoloration EXAM: RIGHT FOOT COMPLETE - 3+ VIEW COMPARISON:  None. FINDINGS: No acute fracture or dislocation is noted. Diffuse vascular calcifications are seen. IMPRESSION: No acute abnormality noted. Electronically Signed   By: Inez Catalina M.D.   On: 10/23/2016 17:50    Lab Data:  CBC:  Recent Labs Lab 11/07/16 0728 11/08/16 0238 11/09/16 0525 11/10/16 0239  11/11/16 0232  WBC 18.3* 17.3* 13.8* 14.3* 13.3*  HGB 9.4* 9.1* 8.2* 8.2* 8.4*  HCT 30.4* 29.6* 26.8* 25.6* 26.1*  MCV 79.6 80.4 79.8 80.0 79.6  PLT 457* 439* 396 428* 992*   Basic Metabolic Panel:  Recent Labs Lab 11/05/16 1510 11/05/16 1704 11/06/16 0529 11/07/16 0728 11/08/16 0238 11/09/16 0525 11/10/16 0239 11/11/16 0232  NA  --   --  139 136 134* 135 134* 134*  K  --   --  3.5 3.3* 2.9* 2.9* 3.0* 3.3*  CL  --   --  96* 95* 97* 96* 95* 96*  CO2  --   --  23 25 23 25 25 24   GLUCOSE  --   --  171* 131* 135* 100* 106* 105*  BUN  --   --  42* 49* 51* 51* 51* 52*  CREATININE  --   --  11.27* 11.35* 10.99* 10.76* 11.02* 10.58*  CALCIUM  --   --  7.2* 7.1* 7.0* 7.3* 7.3* 7.3*  MG 1.9 2.0 2.0  --   --   --  1.9  --   PHOS 7.6* 7.0* 7.6*  --  7.0* 7.0* 6.3*  --    GFR: Estimated Creatinine Clearance:  9.2 mL/min (A) (by C-G formula based on SCr of 10.58 mg/dL (H)). Liver Function Tests:  Recent Labs Lab 11/04/16 1445 11/05/16 0504 11/08/16 0238 11/09/16 0525  AST 22  --   --   --   ALT 24  --   --   --   ALKPHOS 219*  --   --   --   BILITOT 0.6  --   --   --   PROT 6.0*  --   --   --   ALBUMIN 2.2* 2.0* 1.6* 1.5*   No results for input(s): LIPASE, AMYLASE in the last 168 hours.  Recent Labs Lab 11/04/16 1223  AMMONIA 21   Coagulation Profile: No results for input(s): INR, PROTIME in the last 168 hours. Cardiac Enzymes:  Recent Labs Lab 11/05/16 1510  TROPONINI 0.08*   BNP (last 3 results) No results for input(s): PROBNP in the last 8760 hours. HbA1C: No results for input(s): HGBA1C in the last 72 hours. CBG:  Recent Labs Lab 11/10/16 1234 11/10/16 1603 11/10/16 1944 11/10/16 2351 11/11/16 0343  GLUCAP 89 117* 92 111* 97   Lipid Profile: No results for input(s): CHOL, HDL, LDLCALC, TRIG, CHOLHDL, LDLDIRECT in the last 72 hours. Thyroid Function Tests: No results for input(s): TSH, T4TOTAL, FREET4, T3FREE, THYROIDAB in the last 72  hours. Anemia Panel: No results for input(s): VITAMINB12, FOLATE, FERRITIN, TIBC, IRON, RETICCTPCT in the last 72 hours. Urine analysis:    Component Value Date/Time   COLORURINE AMBER (A) 11/04/2015 1930   APPEARANCEUR TURBID (A) 11/04/2015 1930   LABSPEC 1.019 11/04/2015 1930   PHURINE 5.5 11/04/2015 1930   GLUCOSEU 100 (A) 11/04/2015 1930   HGBUR NEGATIVE 11/04/2015 1930   BILIRUBINUR NEGATIVE 11/04/2015 1930   KETONESUR NEGATIVE 11/04/2015 1930   PROTEINUR >300 (A) 11/04/2015 1930   UROBILINOGEN 0.2 09/22/2012 1805   NITRITE NEGATIVE 11/04/2015 1930   LEUKOCYTESUR NEGATIVE 11/04/2015 1930     Yoselin Amerman M.D. [facility] 11/11/2016, 10:41 AM  Pager: [phone] Between 7am to 7pm - call Pager - [phone]  After 7pm go to [email or web address] - password [email or web address]  Call night coverage person covering after 7pm

## 2016-11-11 NOTE — Progress Notes (Addendum)
Progress Note    11/11/2016 7:27 AM 6 Days Post-Op  Subjective:  Says he is ok.  Still with pain in his foot. Says he a visit from the [place] a couple of days ago and he says that is the most peace and tranquility he has had and was very appreciative.     Afebrile HR  70's NSR 030'S-923'R systolic 00% RA  Vitals:   11/11/16 0311 11/11/16 0400  BP: (!) 174/77   Pulse: 74   Resp: 14 16  Temp: 97.8 F (36.6 C)     Physical Exam: Cardiac:  regular Lungs:  Non labored Incisions:  Right groin incision with slight separation proximally and moist.  The BK incision has some blistering around the incision.  The saphenous vein harvest sites look good with staples in tact.  Extremities:  Brisk doppler signals left AT/PT; slightly able to wiggle his toes.    CBC    Component Value Date/Time   WBC 13.3 (H) 11/11/2016 0232   RBC 3.28 (L) 11/11/2016 0232   HGB 8.4 (L) 11/11/2016 0232   HCT 26.1 (L) 11/11/2016 0232   PLT 441 (H) 11/11/2016 0232   MCV 79.6 11/11/2016 0232   MCH 25.6 (L) 11/11/2016 0232   MCHC 32.2 11/11/2016 0232   RDW 20.7 (H) 11/11/2016 0232   LYMPHSABS 0.9 11/01/2016 0425   MONOABS 1.5 (H) 11/01/2016 0425   EOSABS 1.7 (H) 11/01/2016 0425   BASOSABS 0.1 11/01/2016 0425    BMET    Component Value Date/Time   NA 134 (L) 11/11/2016 0232   K 3.3 (L) 11/11/2016 0232   CL 96 (L) 11/11/2016 0232   CO2 24 11/11/2016 0232   GLUCOSE 105 (H) 11/11/2016 0232   BUN 52 (H) 11/11/2016 0232   CREATININE 10.58 (H) 11/11/2016 0232   CALCIUM 7.3 (L) 11/11/2016 0232   GFRNONAA 5 (L) 11/11/2016 0232   GFRAA 6 (L) 11/11/2016 0232    INR    Component Value Date/Time   INR 1.24 10/30/2016 0614     Intake/Output Summary (Last 24 hours) at 11/11/16 0727 Last data filed at 11/10/16 1600  Gross per 24 hour  Intake            15342 ml  Output            16562 ml  Net            -1220 ml     Assessment:  46 y.o. male is s/p:  1. Rightcommon femoral artery to  below-the-knee popliteal artery bypass with non-reversed ipsilateral greater saphenous vein  2. Right first ray amputation 3. Right dorsal ulcer debridement  6 Days Post-Op  Plan: -pt's mental status much improved this morning -bypass is patent with brisk doppler signals left AT/PT, however, his left foot is worrisome.  Will continue to let it demarcate.   -some erythema around BK incision-pt on Vanc every 5 days.  Persistent leukocytosis, but slightly improved today.  Dry gauze to left groin daily and as needed to wick moisture to help prevent wound infection. -DVT prophylaxis:  SQ heparin   Leontine Locket, PA-C [facility] [phone] 11/11/2016 7:27 AM  Addendum  I have independently interviewed and examined the patient, and I agree with the physician assistant's findings.   On exam, pt appears to be confused this afternoon.  There is some blistering proximal to the R 1st ray amputation.  There is some evidence of bleeding into calf incision that was not previously present:  unclear due to mobilization vs injury.  DP > PT signals are greatly improved from prior to surgery.  - Suspect foot is too early to determine if additional intervention is needed - Conservative wound mgmt for now.   - Will have Dr. Sharol Given take a look at the R foot.     Adele Barthel, MD, FACS [facility] Office: [phone] Pager: [phone]  11/11/2016, 11:02 AM

## 2016-11-11 NOTE — Progress Notes (Signed)
Occupational Therapy Treatment Patient Details Name: Hayden [name] MRN: [id number] DOB: 09-29-1970 Today's Date: 11/11/2016    History of present illness Pt is a 46 y.o. M  admitted 4/25 for dry gangrene and cellulitis on the right foot./Right great toe,  and is now s/p  right CFA to below knee popliteal artery bypass, right first toe amputation, right dorsal ulcer debridement performed on 5/8.  He was extubated morning of 5/9. Pt with post-op encephalopathy and agitation. PMH - ESRD on peritoneal dialysis, HTN, CVA, kidney CA.   OT comments  Pt seen this morning.  Only able to transfer to chair using stedy with mod A +2.  Pt very confused - hallucinating at times (seeing puppies, and other items not presently in the room).  He required extensive amount time to transfer due to pain and confusion.  Will continue to follow.   Follow Up Recommendations  CIR;Supervision/Assistance - 24 hour (when pt better able to participate )    Equipment Recommendations  Other (comment) (stedy )    Recommendations for Other Services      Precautions / Restrictions Precautions Precautions: Fall       Mobility Bed Mobility Overal bed mobility: Needs Assistance Bed Mobility: Supine to Sit Rolling: Min assist         General bed mobility comments: Pt determined to attempt himself, initially would not allow therapist to assist.  He did require assist to lift trunk and to scoot hips to EOB   Transfers Overall transfer level: Needs assistance Equipment used: Ambulation equipment used Transfers: Sit to/from [facility] Sit to Stand: Mod assist;+2 physical assistance Stand pivot transfers: Mod assist;+2 physical assistance       General transfer comment: Pt required an extensive amount of time to coax into transferring.  He initially attempted to stand, but was unable.  Pt delusional and at times hallucinating, with speech at times nonsensical.  Once it was determined that pain was  limiting his mobility, the RN provided pain meds, and pt then able to assist with sit to stand - requires assist to boost into standing and assist for balance.     Balance Overall balance assessment: Needs assistance Sitting-balance support: Bilateral upper extremity supported Sitting balance-Leahy Scale: Poor Sitting balance - Comments: requires UE support    Standing balance support: Bilateral upper extremity supported Standing balance-Leahy Scale: Poor Standing balance comment: requires mod A to briefly stand with bil. UE support                            ADL either performed or assessed with clinical judgement   ADL Overall ADL's : Needs assistance/impaired                         Toilet Transfer: Moderate assistance;+2 for safety/equipment;Stand-pivot (stedy )           Functional mobility during ADLs: Moderate assistance;+2 for physical assistance       Vision       Perception     Praxis      Cognition Arousal/Alertness: Awake/alert Behavior During Therapy: Restless;Anxious Overall Cognitive Status: Impaired/Different from baseline Area of Impairment: Orientation;Attention;Following commands;Memory;Safety/judgement;Awareness;Problem solving                 Orientation Level: Disoriented to;Place;Time;Situation Current Attention Level: Sustained Memory: Decreased short-term memory;Decreased recall of precautions Following Commands: Follows one step commands consistently (with cues ) Safety/Judgement: Decreased  awareness of safety;Decreased awareness of deficits   Problem Solving: Slow processing;Decreased initiation;Difficulty sequencing;Requires verbal cues;Requires tactile cues General Comments: Pt actively hallucinating during session - states he sees puppies, and referred to family members who were not present.  Much of conversation nonsensical.  Requires max cues to redirect         Exercises     Shoulder Instructions        General Comments      Pertinent Vitals/ Pain       Pain Assessment: Faces Faces Pain Scale: Hurts whole lot Pain Location: R LE with mobilizing Pain Descriptors / Indicators: Grimacing;Guarding;Moaning Pain Intervention(s): Monitored during session;Repositioned;RN gave pain meds during session;Patient requesting pain meds-RN notified  Home Living                                          Prior Functioning/Environment              Frequency  Min 3X/week        Progress Toward Goals  OT Goals(current goals can now be found in the care plan section)  Progress towards OT goals: Not progressing toward goals - comment (confusion )     Plan Discharge plan remains appropriate    Co-evaluation    PT/OT/SLP Co-Evaluation/Treatment: Yes            AM-PAC PT "6 Clicks" Daily Activity     Outcome Measure   Help from another person eating meals?: A Little Help from another person taking care of personal grooming?: A Little Help from another person toileting, which includes using toliet, bedpan, or urinal?: A Lot Help from another person bathing (including washing, rinsing, drying)?: A Lot Help from another person to put on and taking off regular upper body clothing?: A Lot Help from another person to put on and taking off regular lower body clothing?: A Lot 6 Click Score: 14    End of Session Equipment Utilized During Treatment: Gait belt  OT Visit Diagnosis: Other abnormalities of gait and mobility (R26.89);Unsteadiness on feet (R26.81);Muscle weakness (generalized) (M62.81)   Activity Tolerance Patient limited by pain;Other (comment) (confusion )   Patient Left in chair;with call bell/phone within reach;with nursing/sitter in room   Nurse Communication Mobility status;Need for lift equipment;Patient requests pain meds        Time: 3762-8315 OT Time Calculation (min): 64 min  Charges: OT General Charges $OT Visit: 1 Procedure OT  Treatments $Therapeutic Activity: 53-67 mins  [facility], OTR/L [phone]    Lucille Passy M 11/11/2016, 7:17 PM
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
## 2016-11-11 NOTE — Progress Notes (Signed)
Physical Therapy Treatment Patient Details Name: Hayden [name] MRN: [id number] DOB: 1970-09-28 Today's Date: 11/11/2016    History of Present Illness Pt is a 46 y.o. M  admitted 4/25 for dry gangrene and cellulitis on the right foot./Right great toe,  and is now s/p  right CFA to below knee popliteal artery bypass, right first toe amputation, right dorsal ulcer debridement performed on 5/8.  He was extubated morning of 5/9. Pt with post-op encephalopathy and agitation. PMH - ESRD on peritoneal dialysis, HTN, CVA, kidney CA.    PT Comments    Patient able to perform exercises in bed with max cueing and instructions.     Follow Up Recommendations  CIR;Supervision/Assistance - 24 hour     Equipment Recommendations  Rolling walker with 5" wheels    Recommendations for Other Services       Precautions / Restrictions Precautions Precautions: Fall Precaution Comments: Has been agitated. Restrictions Weight Bearing Restrictions: No    Mobility  Bed Mobility Overal bed mobility: Needs Assistance Bed Mobility: Supine to Sit Rolling: Min assist         General bed mobility comments: Pt determined to attempt himself, initially would not allow therapist to assist.  He did require assist to lift trunk and to scoot hips to EOB   Transfers Overall transfer level: Needs assistance Equipment used: Ambulation equipment used Transfers: Sit to/from [employer] Transfers Sit to Stand: Mod assist;+2 physical assistance Stand pivot transfers: Mod assist;+2 physical assistance       General transfer comment: Pt required an extensive amount of time to coax into transferring.  He initially attempted to stand, but was unable.  Pt delusional and at times hallucinating, with speech at times nonsensical.  Once it was determined that pain was limiting his mobility, the RN provided pain meds, and pt then able to assist with sit to stand - requires assist to boost into standing and assist for  balance.   Ambulation/Gait                 Stairs            Wheelchair Mobility    Modified Rankin (Stroke Patients Only)       Balance Overall balance assessment: Needs assistance Sitting-balance support: Bilateral upper extremity supported Sitting balance-Leahy Scale: Poor Sitting balance - Comments: requires UE support    Standing balance support: Bilateral upper extremity supported Standing balance-Leahy Scale: Poor Standing balance comment: requires mod A to briefly stand with bil. UE support                             Cognition Arousal/Alertness: Awake/alert Behavior During Therapy: Anxious;Restless Overall Cognitive Status: Impaired/Different from baseline Area of Impairment: Orientation;Attention;Following commands;Memory;Safety/judgement;Awareness;Problem solving                 Orientation Level: Disoriented to;Place;Time;Situation Current Attention Level: Sustained Memory: Decreased short-term memory;Decreased recall of precautions Following Commands: Follows one step commands consistently Safety/Judgement: Decreased awareness of safety;Decreased awareness of deficits   Problem Solving: Slow processing;Decreased initiation;Difficulty sequencing;Requires verbal cues;Requires tactile cues General Comments: Patient not as agitated as earlier.  Was able to follow commands for exercises.      Exercises General Exercises - Upper Extremity Shoulder Flexion: AROM;Both;5 reps;Supine General Exercises - Lower Extremity Ankle Circles/Pumps: AROM;Both;5 reps Quad Sets: AROM;Right;5 reps;Supine (Difficulty straightening knee due to pain) Short Arc Quad: Left;5 reps;Supine Heel Slides: AROM;Left;AAROM;Right;5 reps;Supine Straight Leg Raises: AROM;Both;5 reps;Supine  General Comments General comments (skin integrity, edema, etc.): Assisted nsg staff with transfer back to bed as pt has been aggressive with staff.  Was able to  de-escalate pt to allow RN to provide Ativan, then assisted staff with transfer back to bed       Pertinent Vitals/Pain Pain Assessment: Faces Faces Pain Scale: Hurts even more Pain Location: Rt LE  Pain Descriptors / Indicators: Grimacing;Guarding Pain Intervention(s): Limited activity within patient's tolerance;Monitored during session    Home Living                      Prior Function            PT Goals (current goals can now be found in the care plan section) Acute Rehab PT Goals Patient Stated Goal: none stated today Progress towards PT goals: Not progressing toward goals - comment (Due to pain and confusion)    Frequency    Min 3X/week      PT Plan Current plan remains appropriate    Co-evaluation              AM-PAC PT "6 Clicks" Daily Activity  Outcome Measure  Difficulty turning over in bed (including adjusting bedclothes, sheets and blankets)?: Total Difficulty moving from lying on back to sitting on the side of the bed? : Total Difficulty sitting down on and standing up from a chair with arms (e.g., wheelchair, bedside commode, etc,.)?: Total Help needed moving to and from a bed to chair (including a wheelchair)?: A Little Help needed walking in hospital room?: A Lot Help needed climbing 3-5 steps with a railing? : Total 6 Click Score: 9    End of Session   Activity Tolerance: Patient limited by pain (Patient on peritoneal dialysis) Patient left: in bed;with call bell/phone within reach;with bed alarm set   PT Visit Diagnosis: Other abnormalities of gait and mobility (R26.89);Muscle weakness (generalized) (M62.81);Unsteadiness on feet (R26.81);Pain Pain - Right/Left: Right Pain - part of body: Leg     Time: 4599-7741 PT Time Calculation (min) (ACUTE ONLY): 10 min  Charges:  $Therapeutic Exercise: 8-22 mins                    G Codes:       Carita Pian. Sanjuana Kava, [facility] Acute Rehab Services Pager [place] 11/11/2016, 8:38 PM

## 2016-11-11 NOTE — Progress Notes (Signed)
Hayden [name] Progress Note   Subjective: no c/o's  Vitals:   11/11/16 0400 11/11/16 0853 11/11/16 0923 11/11/16 0935  BP:  (!) 164/79 133/84 133/84  Pulse:  78 77 100  Resp: 16 16 (!) 29   Temp:  97.9 F (36.6 C) 98.1 F (36.7 C)   TempSrc:  Oral Oral   SpO2:      Weight:      Height:        Inpatient medications: . ALPRAZolam  1 mg Oral BID  . amLODipine  5 mg Oral Daily  . aspirin  81 mg Oral Daily  . budesonide (PULMICORT) nebulizer solution  0.5 mg Nebulization BID  . buPROPion  150 mg Oral Daily   And  . buPROPion  300 mg Oral QHS  . calcitRIOL  0.5 mcg Oral Daily  . cinacalcet  60 mg Oral Q supper  . cloNIDine  0.2 mg Transdermal Weekly  . darbepoetin (ARANESP) injection - NON-DIALYSIS  200 mcg Subcutaneous Q Sun-1800  . divalproex  500 mg Oral Q12H  . docusate sodium  50 mg Oral Daily  . feeding supplement (PRO-STAT SUGAR FREE 64)  30 mL Oral BID  . ferric citrate  420 mg Oral TID WC  . gentamicin cream  1 application Topical Daily  . heparin  5,000 Units Subcutaneous Q8H  . insulin aspart  0-9 Units Subcutaneous Q4H  . isosorbide dinitrate  5 mg Oral BID  . labetalol  200 mg Oral BID  . mouth rinse  15 mL Mouth Rinse BID  . pantoprazole  40 mg Oral Daily  . pravastatin  20 mg Oral Daily  . saccharomyces boulardii  250 mg Oral BID  . sodium chloride flush  3 mL Intravenous Q12H   . sodium chloride Stopped (11/05/16 1412)  . dialysis solution 1.5% low-MG/low-CA    . dialysis solution 2.5% low-MG/low-CA    . vancomycin Stopped (11/09/16 1100)   sodium chloride, ALPRAZolam, fentaNYL (SUBLIMAZE) injection, Gerhardt's butt cream, dianeal solution for CAPD/CCPD with heparin, hydrALAZINE, HYDROcodone-acetaminophen, prochlorperazine  Exam: Alert, tremulous, able to converse No jvd Chest clear bilat RRR no murmur ABd obese, soft ntnd, PD cath in place Ext RLE edema post-op, R 1st toe amp. No LLE edema L forearm AVF +bruit  Dialysis: [place] [facility].  CCPD: 3L fill 5 exchanges at night with 5th fill manually drained 1-1/2 hours later. Uses mostly 1's and 2's at home.       Assessment: 1  PAD/ ischemic R foot / sp R fem-pop bypass w great toe amp on 5/8 - on Vanc IV 2  AMS - due to metabolic enceph, improving 3  ESRD cont PD 4  Vol looks euvolemic, down 4-8kg from admission 5  Abnormal myoview - per cards prob inaccurate, no further invest 6  Anemia cont darbe , ^'d 200/wk  Plan - cont CCPD, using 1.5% and 2.5 % mix   Kelly Splinter MD [name] pager [phone]   11/11/2016, 10:53 AM    Recent Labs Lab 11/08/16 0238 11/09/16 0525 11/10/16 0239 11/11/16 0232  NA 134* 135 134* 134*  K 2.9* 2.9* 3.0* 3.3*  CL 97* 96* 95* 96*  CO2 23 25 25 24   GLUCOSE 135* 100* 106* 105*  BUN 51* 51* 51* 52*  CREATININE 10.99* 10.76* 11.02* 10.58*  CALCIUM 7.0* 7.3* 7.3* 7.3*  PHOS 7.0* 7.0* 6.3*  --     Recent Labs Lab 11/04/16 1445 11/05/16 0504 11/08/16 0238 11/09/16 3557  AST 22  --   --   --   ALT 24  --   --   --   ALKPHOS 219*  --   --   --   BILITOT 0.6  --   --   --   PROT 6.0*  --   --   --   ALBUMIN 2.2* 2.0* 1.6* 1.5*    Recent Labs Lab 11/09/16 0525 11/10/16 0239 11/11/16 0232  WBC 13.8* 14.3* 13.3*  HGB 8.2* 8.2* 8.4*  HCT 26.8* 25.6* 26.1*  MCV 79.8 80.0 79.6  PLT 396 428* 441*   Iron/TIBC/Ferritin/ %Sat    Component Value Date/Time   IRON 7 (L) 11/15/2015 1342   TIBC 164 (L) 11/15/2015 1342   FERRITIN 1,444 (H) 11/15/2015 1342   IRONPCTSAT 4 (L) 11/15/2015 1342

## 2016-11-11 NOTE — Progress Notes (Signed)
   11/11/16 1235  Clinical Encounter Type  Visited With Patient  Visit Type Initial  Spiritual Encounters  Spiritual Needs Emotional  Stress Factors  Patient Stress Factors Health changes  Introduction to Pt after being paged he wanted to see a chaplain. Began discussion on faith issues but discussion disorganized and pt confused. Medical staff came in to check up on Pt.

## 2016-11-12 DIAGNOSIS — R410 Disorientation, unspecified: Secondary | ICD-10-CM

## 2016-11-12 DIAGNOSIS — G8918 Other acute postprocedural pain: Secondary | ICD-10-CM

## 2016-11-12 DIAGNOSIS — Z8673 Personal history of transient ischemic attack (TIA), and cerebral infarction without residual deficits: Secondary | ICD-10-CM

## 2016-11-12 DIAGNOSIS — Z720 Tobacco use: Secondary | ICD-10-CM

## 2016-11-12 DIAGNOSIS — Z95828 Presence of other vascular implants and grafts: Secondary | ICD-10-CM

## 2016-11-12 DIAGNOSIS — Z992 Dependence on renal dialysis: Secondary | ICD-10-CM

## 2016-11-12 DIAGNOSIS — D72829 Elevated white blood cell count, unspecified: Secondary | ICD-10-CM

## 2016-11-12 DIAGNOSIS — I10 Essential (primary) hypertension: Secondary | ICD-10-CM

## 2016-11-12 DIAGNOSIS — N186 End stage renal disease: Secondary | ICD-10-CM

## 2016-11-12 LAB — CBC
HEMATOCRIT: 24.9 % — AB (ref 39.0–52.0)
Hemoglobin: 8 g/dL — ABNORMAL LOW (ref 13.0–17.0)
MCH: 25.7 pg — ABNORMAL LOW (ref 26.0–34.0)
MCHC: 32.1 g/dL (ref 30.0–36.0)
MCV: 80.1 fL (ref 78.0–100.0)
Platelets: 495 10*3/uL — ABNORMAL HIGH (ref 150–400)
RBC: 3.11 MIL/uL — ABNORMAL LOW (ref 4.22–5.81)
RDW: 21.1 % — AB (ref 11.5–15.5)
WBC: 13.4 10*3/uL — AB (ref 4.0–10.5)

## 2016-11-12 LAB — GLUCOSE, CAPILLARY
GLUCOSE-CAPILLARY: 113 mg/dL — AB (ref 65–99)
GLUCOSE-CAPILLARY: 105 mg/dL — AB (ref 65–99)
GLUCOSE-CAPILLARY: 100 mg/dL — AB (ref 65–99)
Glucose-Capillary: 102 mg/dL — ABNORMAL HIGH (ref 65–99)
Glucose-Capillary: 71 mg/dL (ref 65–99)
Glucose-Capillary: 78 mg/dL (ref 65–99)
Glucose-Capillary: 88 mg/dL (ref 65–99)
Glucose-Capillary: 92 mg/dL (ref 65–99)

## 2016-11-12 LAB — RENAL FUNCTION PANEL
ANION GAP: 13 (ref 5–15)
Albumin: 1.5 g/dL — ABNORMAL LOW (ref 3.5–5.0)
BUN: 58 mg/dL — AB (ref 6–20)
CALCIUM: 7.3 mg/dL — AB (ref 8.9–10.3)
CO2: 23 mmol/L (ref 22–32)
Chloride: 97 mmol/L — ABNORMAL LOW (ref 101–111)
Creatinine, Ser: 10.27 mg/dL — ABNORMAL HIGH (ref 0.61–1.24)
GFR calc Af Amer: 6 mL/min — ABNORMAL LOW (ref >60)
GFR, EST NON AFRICAN AMERICAN: 5 mL/min — AB (ref >60)
Glucose, Bld: 75 mg/dL (ref 65–99)
PHOSPHORUS: 5.4 mg/dL — AB (ref 2.5–4.6)
POTASSIUM: 3.4 mmol/L — AB (ref 3.5–5.1)
Sodium: 133 mmol/L — ABNORMAL LOW (ref 135–145)

## 2016-11-12 LAB — VANCOMYCIN, RANDOM: VANCOMYCIN RM: 15

## 2016-11-12 MED ORDER — NEPRO/CARBSTEADY PO LIQD
237.0000 mL | Freq: Two times a day (BID) | ORAL | Status: DC
  Administered 2016-11-13 – 2016-11-18 (×6): 237 mL via ORAL
  Filled 2016-11-12 (×10): qty 237

## 2016-11-12 MED ORDER — DELFLEX-LC/1.5% DEXTROSE 344 MOSM/L IP SOLN: INTRAPERITONEAL | Status: DC

## 2016-11-12 MED ORDER — LORAZEPAM 2 MG/ML IJ SOLN
1.0000 mg | Freq: Four times a day (QID) | INTRAMUSCULAR | Status: DC
  Administered 2016-11-13 – 2016-11-19 (×23): 1 mg via INTRAVENOUS
  Filled 2016-11-12 (×23): qty 1

## 2016-11-12 MED ORDER — BUPROPION HCL ER (XL) 300 MG PO TB24: 300.0000 mg | ORAL_TABLET | Freq: Every morning | ORAL | Status: DC

## 2016-11-12 MED ORDER — BUPROPION HCL ER (XL) 300 MG PO TB24
300.0000 mg | ORAL_TABLET | Freq: Every morning | ORAL | Status: DC
  Administered 2016-11-13 – 2016-11-14 (×2): 300 mg via ORAL
  Filled 2016-11-12 (×3): qty 1

## 2016-11-12 MED ORDER — VALPROATE SODIUM 500 MG/5ML IV SOLN
500.0000 mg | Freq: Two times a day (BID) | INTRAVENOUS | Status: DC
  Administered 2016-11-12 – 2016-11-19 (×14): 500 mg via INTRAVENOUS
  Filled 2016-11-12 (×15): qty 5

## 2016-11-12 MED ORDER — GENTAMICIN SULFATE 0.1 % EX CREA
1.0000 "application " | TOPICAL_CREAM | Freq: Every day | CUTANEOUS | Status: DC
  Administered 2016-11-12 – 2016-11-13 (×2): 1 via TOPICAL
  Filled 2016-11-12: qty 15

## 2016-11-12 MED ORDER — LORAZEPAM 2 MG/ML IJ SOLN
1.0000 mg | INTRAMUSCULAR | Status: AC | PRN
  Administered 2016-11-12 (×5): 2 mg via INTRAVENOUS
  Filled 2016-11-12 (×5): qty 1

## 2016-11-12 MED ORDER — DOCUSATE SODIUM 100 MG PO CAPS
100.0000 mg | ORAL_CAPSULE | Freq: Every day | ORAL | Status: DC
  Administered 2016-11-13 – 2016-11-20 (×7): 100 mg via ORAL
  Filled 2016-11-12 (×7): qty 1

## 2016-11-12 MED ORDER — VANCOMYCIN HCL IN DEXTROSE 1-5 GM/200ML-% IV SOLN
1000.0000 mg | INTRAVENOUS | Status: DC
  Filled 2016-11-12: qty 200

## 2016-11-12 MED ORDER — LORAZEPAM 2 MG/ML IJ SOLN: 1.0000 mg | Freq: Once | INTRAMUSCULAR | Status: AC

## 2016-11-12 MED ORDER — DEXTROSE 50 % IV SOLN
INTRAVENOUS | Status: AC
  Administered 2016-11-12: 25 mL via INTRAVENOUS
  Filled 2016-11-12: qty 50

## 2016-11-12 MED ORDER — DEXTROSE 50 % IV SOLN
25.0000 mL | Freq: Once | INTRAVENOUS | Status: AC
  Administered 2016-11-12: 25 mL via INTRAVENOUS

## 2016-11-12 NOTE — Progress Notes (Signed)
Pt. Currently off the floor in HD and no family in room.  I will follow up tomorrow.  Please call if questions.  [place] Admissions Coordinator Cell [phone] Office [phone]

## 2016-11-12 NOTE — Care Management Note (Signed)
Case Management Note Original note created by; Marvetta Gibbons RN, BSN Unit 2W-Case Manager [phone]  Patient Details  Name: Hayden [name] MRN: [id number] Date of Birth: 1971/06/25  Subjective/Objective:   Pt admitted with necrotic toe, hx ESRD on PD at home. s/p Rightcommon femoral artery to below-the-knee popliteal artery bypass with non-reversed ipsilateral greater saphenous vein  2. Right first ray amputation 3. Right dorsal ulcer debridement -- on 11/05/16--- post op remains on Vent as of 5/9                 Action/Plan: PTA pt lived at home with wife- CM will follow for d/c needs- PT/OT evals pending-   Expected Discharge Date:                  Expected Discharge Plan:  [place]  In-House Referral:     Discharge planning Services  CM Consult  Post Acute Care Choice:    Choice offered to:     DME Arranged:    DME Agency:     HH Arranged:    [place] Agency:     Status of Service:  In process, will continue to follow  If discussed at Long Length of Stay Meetings, dates discussed:    Discharge Disposition:   Additional Comments: 11/12/2016 Pt POD #7 s/p R CFA to BK pop w/ ips NR GSV, R 1st ray amp Plan is to d/c staples in another 3-7 days - additional intervention may be warranted.  CIR consulted - CSW consulted as back up plan Maryclare Labrador, RN 11/12/2016, 8:56 AM

## 2016-11-12 NOTE — Progress Notes (Signed)
[facility] Progress Note   Subjective: very agitated overnight, and this am.    Vitals:   11/12/16 0408 11/12/16 0559 11/12/16 0729 11/12/16 0805  BP: (!) 158/82  (!) 141/86   Pulse: 75  77   Resp: 16  (!) 21   Temp: 98.5 F (36.9 C)  98.6 F (37 C)   TempSrc: Oral  Oral   SpO2:    97%  Weight:  89.9 kg (198 lb 3.1 oz)    Height:        Inpatient medications: . ALPRAZolam  1 mg Oral BID  . amLODipine  5 mg Oral Daily  . aspirin  81 mg Oral Daily  . budesonide (PULMICORT) nebulizer solution  0.5 mg Nebulization BID  . [START ON 11/13/2016] buPROPion  300 mg Oral q morning - 10a  . calcitRIOL  0.5 mcg Oral Daily  . cinacalcet  60 mg Oral Q supper  . cloNIDine  0.2 mg Transdermal Weekly  . darbepoetin (ARANESP) injection - NON-DIALYSIS  200 mcg Subcutaneous Q Sun-1800  . divalproex  500 mg Oral Q12H  . docusate sodium  100 mg Oral Daily  . feeding supplement (PRO-STAT SUGAR FREE 64)  30 mL Oral BID  . ferric citrate  420 mg Oral TID WC  . heparin  5,000 Units Subcutaneous Q8H  . insulin aspart  0-9 Units Subcutaneous Q4H  . isosorbide dinitrate  5 mg Oral BID  . labetalol  200 mg Oral BID  . mouth rinse  15 mL Mouth Rinse BID  . pantoprazole  40 mg Oral Daily  . pravastatin  20 mg Oral Daily  . saccharomyces boulardii  250 mg Oral BID  . sodium chloride flush  3 mL Intravenous Q12H   . sodium chloride Stopped (11/05/16 1412)  . vancomycin Stopped (11/09/16 1100)   sodium chloride, ALPRAZolam, fentaNYL (SUBLIMAZE) injection, Gerhardt's butt cream, dianeal solution for CAPD/CCPD with heparin, hydrALAZINE, HYDROcodone-acetaminophen, LORazepam, prochlorperazine  Exam: Licensed conveyancer of ideas, rambling on and on continuous No jvd Chest clear bilat RRR no murmur ABd obese, soft ntnd, PD cath in place Ext RLE edema post-op, R 1st toe amp. No LLE edema L forearm AVF +bruit  Dialysis: [place] home [facility].  CCPD: 3L fill 5 exchanges at night with 5th fill  manually drained 1-1/2 hours later. Uses mostly 1's and 2's at home.       Assessment: 1  PAD/ ischemic R foot / sp R fem-pop bypass w great toe amp on 5/8 - on Vanc IV 2  AMS - psychosis w/ agitation.  Not sure , could have some underdialysis on PD.  Will do a trial of HD starting today.  Pt has AVF.  Will need sedation for hemodialysis.  3  ESRD cont PD 4  Vol looks euvolemic, down 4-6kg from admission 5  Abnormal myoview - per cards prob inaccurate, no further invest 6  Anemia cont darbe , ^'d 200/wk  Plan - HD today and tomorrow, sedate with IV Ativan   Kelly Splinter MD [facility] pager [phone]   11/12/2016, 1:17 PM    Recent Labs Lab 11/08/16 0238 11/09/16 0525 11/10/16 0239 11/11/16 0232  NA 134* 135 134* 134*  K 2.9* 2.9* 3.0* 3.3*  CL 97* 96* 95* 96*  CO2 23 25 25 24   GLUCOSE 135* 100* 106* 105*  BUN 51* 51* 51* 52*  CREATININE 10.99* 10.76* 11.02* 10.58*  CALCIUM 7.0* 7.3* 7.3* 7.3*  PHOS 7.0* 7.0* 6.3*  --  Recent Labs Lab 11/08/16 0238 11/09/16 0525  ALBUMIN 1.6* 1.5*    Recent Labs Lab 11/09/16 0525 11/10/16 0239 11/11/16 0232  WBC 13.8* 14.3* 13.3*  HGB 8.2* 8.2* 8.4*  HCT 26.8* 25.6* 26.1*  MCV 79.8 80.0 79.6  PLT 396 428* 441*   Iron/TIBC/Ferritin/ %Sat    Component Value Date/Time   IRON 7 (L) 11/15/2015 1342   TIBC 164 (L) 11/15/2015 1342   FERRITIN 1,444 (H) 11/15/2015 1342   IRONPCTSAT 4 (L) 11/15/2015 1342
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
## 2016-11-12 NOTE — Progress Notes (Addendum)
Physical Therapy Treatment Patient Details Name: Hayden [name] MRN: [id number] DOB: February 12, 1971 Today's Date: 11/12/2016    History of Present Illness Pt is a 46 y.o. M  admitted 4/25 for dry gangrene and cellulitis on the right foot./Right great toe,  and is now s/p  right CFA to below knee popliteal artery bypass, right first toe amputation, right dorsal ulcer debridement performed on 5/8.  He was extubated morning of 5/9. Pt with post-op encephalopathy and agitation. PMH - ESRD on peritoneal dialysis, HTN, CVA, kidney CA.    PT Comments    Pt still unable to focus on and participate with therapies due to delirium/encephalopathy and pain2   Follow Up Recommendations  CIR;Supervision/Assistance - 24 hour     Equipment Recommendations  Rolling walker with 5" wheels    Recommendations for Other Services       Precautions / Restrictions Precautions Precautions: Fall Precaution Comments: Pt agitated, delusional, hallucinating Restrictions Weight Bearing Restrictions: No    Mobility  Bed Mobility Overal bed mobility: Needs Assistance Bed Mobility: Rolling Rolling: Max assist;+2 for physical assistance         General bed mobility comments: Rolling Lt and Rt to adjust bed linens and bathe posterior body.  Required max cues and assist to roll UB and +1 to mange Rt LE to prevent pain.   Scooted up in bed with +3 assist   Transfers                 General transfer comment: Not attempted due to agitation and delerium   Ambulation/Gait                 Stairs            Wheelchair Mobility    Modified Rankin (Stroke Patients Only)       Balance                                            Cognition Arousal/Alertness: Awake/alert;Lethargic;Suspect due to medications Behavior During Therapy: Anxious;Restless;Impulsive Overall Cognitive Status: Impaired/Different from baseline Area of Impairment: Attention                  Orientation Level: Disoriented to;Place;Time;Situation Current Attention Level: Focused Memory: Decreased short-term memory;Decreased recall of precautions Following Commands: Follows one step commands inconsistently Safety/Judgement: Decreased awareness of safety;Decreased awareness of deficits     General Comments: Pt with ramblings speech and eyes darting.  He will briefly focus on therapist to follow functional commands inconsistently.  Pt delusional and hallucinating       Exercises      General Comments        Pertinent Vitals/Pain Pain Assessment: Faces Faces Pain Scale: Hurts even more Pain Location: Rt LE  Pain Descriptors / Indicators: Grimacing;Guarding Pain Intervention(s): Monitored during session    Home Living                      Prior Function            PT Goals (current goals can now be found in the care plan section) Acute Rehab PT Goals Patient Stated Goal: none stated today PT Goal Formulation: Patient unable to participate in goal setting Time For Goal Achievement: 11/13/16 Potential to Achieve Goals: Fair Progress towards PT goals: Not progressing toward goals - comment (too agitated and delusional to participate  toward goals)    Frequency    Min 3X/week      PT Plan Current plan remains appropriate    Co-evaluation PT/OT/SLP Co-Evaluation/Treatment: Yes Reason for Co-Treatment: For patient/therapist safety          AM-PAC PT "6 Clicks" Daily Activity  Outcome Measure  Difficulty turning over in bed (including adjusting bedclothes, sheets and blankets)?: Total Difficulty moving from lying on back to sitting on the side of the bed? : Total Difficulty sitting down on and standing up from a chair with arms (e.g., wheelchair, bedside commode, etc,.)?: Total Help needed moving to and from a bed to chair (including a wheelchair)?: A Little Help needed walking in hospital room?: A Lot Help needed climbing 3-5 steps with a  railing? : Total 6 Click Score: 9    End of Session   Activity Tolerance: Patient limited by pain;Treatment limited secondary to agitation Patient left: in bed;with call bell/phone within reach;with bed alarm set   PT Visit Diagnosis: Other abnormalities of gait and mobility (R26.89);Pain Pain - Right/Left: Right Pain - part of body: Leg     Time: 1747-1595 PT Time Calculation (min) (ACUTE ONLY): 24 min  Charges:  $Therapeutic Activity: 8-22 mins                    G Codes:       2016-11-17  Donnella Sham, PT [phone]  (pager)   [name] 2016-11-17, 2:41 PM

## 2016-11-12 NOTE — Progress Notes (Signed)
Pharmacy Antibiotic Note  Hayden [name] is a 46 y.o. male admitted on 10/23/2016 with cellulitis.  Pharmacy has been consulted for vancomycin dosing.  VR pre HD was therapeutic at 15 today. Plan is for full HD sessions with a BFR of 455ml/min for 3.5 hrs.  Plan: Give vancomycin 1g IV x 1 at end of HD session F/U further HD sessions  Height: 5\' 10"  (177.8 cm) Weight: 198 lb 3.1 oz (89.9 kg) IBW/kg (Calculated) : 73  Temp (24hrs), Avg:98.3 F (36.8 C), Min:97.5 F (36.4 C), Max:98.7 F (37.1 C)   Recent Labs Lab 11/08/16 0238 11/09/16 0525 11/10/16 0239 11/11/16 0232 11/12/16 1430 11/12/16 1530  WBC 17.3* 13.8* 14.3* 13.3*  --  13.4*  CREATININE 10.99* 10.76* 11.02* 10.58*  --  10.27*  VANCORANDOM  --  14  --   --  15  --     Estimated Creatinine Clearance: 10.4 mL/min (A) (by C-G formula based on SCr of 10.27 mg/dL (H)).    Allergies  Allergen Reactions  . Dilaudid [Hydromorphone Hcl] Other (See Comments)    ABNORMAL BEHAVIORS "VERBALLY AND PHYSICALLY ABUSIVE" PSYCHOSIS  . Morphine And Related Itching and Other (See Comments)    SYNCOPE   Thank you for allowing pharmacy to be a part of this patient's care.  Reginia Naas 11/12/2016 4:58 PM

## 2016-11-12 NOTE — Progress Notes (Signed)
Nutrition Follow-up  DOCUMENTATION CODES:   Not applicable  INTERVENTION:   -Nepro Shake po BID, each supplement provides 425 kcal and 19 grams protein  NUTRITION DIAGNOSIS:   Increased nutrient needs related to wound healing as evidenced by estimated needs.  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  Pt being transported to HD suite at time of visit. Staff report that pt remains agitated.   Pt advanced to a renal diet on 11/07/16. Meal completion 50-100%. RD will add Nepro shakes due to increased needs.   Reviewed nephrology notes, plan to initiate HD today and tomorrow with sedation to potential underdialysis on PD and for hopeful improvement with confusion.   CIR team following for potential admission.   Labs reviewed: Na: 133, K: 3.4, CBGS: 71-105.   Diet Order:  Diet renal with fluid restriction Fluid restriction: 1200 mL Fluid; Room service appropriate? Yes; Fluid consistency: Thin  Skin:  Wound (see comment) (Deep tissue injury R foot, unstageable R foot, incisions)  Last BM:  11/11/16  Height:   Ht Readings from Last 1 Encounters:  10/25/16 5\' 10"  (1.778 m)    Weight:   Wt Readings from Last 1 Encounters:  11/12/16 198 lb 3.1 oz (89.9 kg)    Ideal Body Weight:  75.4 kg  BMI:  Body mass index is 28.44 kg/m.  Estimated Nutritional Needs:   Kcal:  2200-2400  Protein:  100-120 grams  Fluid:  1.2 L/day  EDUCATION NEEDS:   No education needs identified at this time  Hayden [name] A. Jimmye Norman, RD, LDN, CDE Pager: [phone] After hours Pager: [phone]

## 2016-11-12 NOTE — Progress Notes (Signed)
[facility]                                                                              Patient Demographics  Hayden [name], is a 46 y.o. male, DOB - Jan 25, 1971, AST:[id number]  Admit date - 10/23/2016   Admitting Physician Norval Morton, MD  Outpatient Primary MD for the patient is Corliss Parish, MD  Outpatient specialists:   LOS - 19  days    Chief Complaint  Patient presents with  . Foot Injury       Brief summary   46 y/o male with ESRD on PD, admitted on 4/25 with cellulitis and gangrene of right great toe. He was also noted to be encephalopathic. He was seen by vascular surgery and underwent right great toe amputation and right common femoral artery to below the knee popliteal artery bypass. He is continued on vancomycin. He is undergoing PD per nephrology. Hospital course has been complicated by encephalopathy and worsening delirium, needing precedex infusion. Since being started on precedex, his mental status has started to improve. He has been weaned off precedex and has been started on xanax. Will need physical therapy and possible placement. PT evaluation recommended CIR, inpatient rehabilitation consulted. 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  - Cultures with no growth - Vascular surgery following closely, defer antibiotic course to vascular surgery  Active Problems: Peripheral vascular disease - Status post right common femoral artery to below the knee popliteal artery bypass with non-reversed ipsilateral greater saphenous vein - Vascular surgery following closely    Hypertensive urgency, malignant - Currently BP stable, continue labetalol, isosorbide, clonidine patch, amlodipine, hydralazine as needed    End stage renal disease on dialysis [facility]) - Patient is  on peritoneal dialysis, nephrology following  Acute encephalopathy, underlying history of depression and anxiety - Patient's hospital course has been complicated due to acute encephalopathy/delirium - Patient has been seen by neurology and psychiatry during this admission, was noted to be encephalopathic. It was felt that his encephalopathy may be related to his acute medical issues as well as possible withdrawal from medications/alcohol. - Antipsychotics were not recommended due to prolonged QTC so he was started on Depakote and lorazepam. - After the surgery encephalopathy worsened and the patient was transferred to ICU and was placed on Precedex infusion. - Currently off Precedex and on Xanax, Wellbutrin and Depakote - This morning, patient noticed to be back in acute psychosis, pressures and rapid speech, I have requested psychiatry to see him, QTC 517, unable to give Haldol. Will give ativan 1mg . I have also discussed with Dr. Jonnie Finner if changing the patient to hemodialysis could help his encephalopathy, Dr. Jonnie Finner will assess for HD    Anemia of chronic disease - H&H currently stable  Hyperlipidemia - Continue statin  GERD - Continue PPI  Diabetes mellitus type 2 - Continue sliding scale insulin  Code Status: Full CODE STATUS DVT Prophylaxis: heparin  Family Communication: Discussed in  detail with the patient, all imaging results, lab results explained to the patient and wife at the bedside   Disposition Plan:   Time Spent in minutes  25 minutes  Procedures:  4/25 Admit  5/07 Seen by Neurology &Psychiatry  5/08 To OR for right femoropopliteal vs femorotibial bypass &right great toe amputation  5/09 Extubated 5/11 Off precedex  5/13 [facility] assumed care  Consultants:    PCCM  Vascular surgery  Nephrology for PD  Neurology  Psychiatry  Cardiology for pre-op clearance, stress test is low risk study  CIR  Antimicrobials:        Vancomycin 4/25 x1, 5/2 >>    Zosyn 4/25 >5/10   Medications  Scheduled Meds: . ALPRAZolam  1 mg Oral BID  . amLODipine  5 mg Oral Daily  . aspirin  81 mg Oral Daily  . budesonide (PULMICORT) nebulizer solution  0.5 mg Nebulization BID  . [START ON 11/13/2016] buPROPion  300 mg Oral q morning - 10a  . calcitRIOL  0.5 mcg Oral Daily  . cinacalcet  60 mg Oral Q supper  . cloNIDine  0.2 mg Transdermal Weekly  . darbepoetin (ARANESP) injection - NON-DIALYSIS  200 mcg Subcutaneous Q Sun-1800  . divalproex  500 mg Oral Q12H  . docusate sodium  100 mg Oral Daily  . feeding supplement (PRO-STAT SUGAR FREE 64)  30 mL Oral BID  . ferric citrate  420 mg Oral TID WC  . heparin  5,000 Units Subcutaneous Q8H  . insulin aspart  0-9 Units Subcutaneous Q4H  . isosorbide dinitrate  5 mg Oral BID  . labetalol  200 mg Oral BID  . mouth rinse  15 mL Mouth Rinse BID  . pantoprazole  40 mg Oral Daily  . pravastatin  20 mg Oral Daily  . saccharomyces boulardii  250 mg Oral BID  . sodium chloride flush  3 mL Intravenous Q12H   Continuous Infusions: . sodium chloride Stopped (11/05/16 1412)  . vancomycin Stopped (11/09/16 1100)   PRN Meds:.sodium chloride, ALPRAZolam, fentaNYL (SUBLIMAZE) injection, Gerhardt's butt cream, dianeal solution for CAPD/CCPD with heparin, hydrALAZINE, HYDROcodone-acetaminophen, LORazepam, prochlorperazine   Antibiotics   Anti-infectives    Start     Dose/Rate Route Frequency Ordered Stop   11/09/16 1000  vancomycin (VANCOCIN) 500 mg in sodium chloride 0.9 % 100 mL IVPB     500 mg 100 mL/hr over 60 Minutes Intravenous Once every 5 days 11/04/16 1326     11/05/16 0800  piperacillin-tazobactam (ZOSYN) IVPB 2.25 g  Status:  Discontinued     2.25 g 100 mL/hr over 30 Minutes Intravenous To Surgery 11/05/16 0747 11/05/16 1413   11/04/16 2200  piperacillin-tazobactam (ZOSYN) IVPB 2.25 g  Status:  Discontinued     2.25 g 100 mL/hr over 30 Minutes Intravenous Every 12 hours 11/04/16 1507 11/07/16 1011     11/04/16 1400  vancomycin (VANCOCIN) 500 mg in sodium chloride 0.9 % 100 mL IVPB     500 mg 100 mL/hr over 60 Minutes Intravenous  Once 11/04/16 1317 11/04/16 1615   10/31/16 2000  piperacillin-tazobactam (ZOSYN) IVPB 3.375 g  Status:  Discontinued     3.375 g 12.5 mL/hr over 240 Minutes Intravenous Every 12 hours 10/31/16 0842 11/04/16 1507   10/30/16 1400  vancomycin (VANCOCIN) 500 mg in sodium chloride 0.9 % 100 mL IVPB  Status:  Discontinued     500 mg 100 mL/hr over 60 Minutes Intravenous Once every 6 days 10/30/16 1306 11/04/16 1317  10/30/16 1000  vancomycin (VANCOCIN) 500 mg in sodium chloride 0.9 % 100 mL IVPB  Status:  Discontinued     500 mg 100 mL/hr over 60 Minutes Intravenous Once every 6 days 10/29/16 1114 10/30/16 1306   10/24/16 0600  piperacillin-tazobactam (ZOSYN) IVPB 3.375 g  Status:  Discontinued     3.375 g 12.5 mL/hr over 240 Minutes Intravenous Every 12 hours 10/23/16 1854 10/31/16 0842   10/23/16 1845  piperacillin-tazobactam (ZOSYN) IVPB 3.375 g     3.375 g 100 mL/hr over 30 Minutes Intravenous  Once 10/23/16 1837 10/23/16 1928   10/23/16 1845  vancomycin (VANCOCIN) 2,000 mg in sodium chloride 0.9 % 500 mL IVPB     2,000 mg 250 mL/hr over 120 Minutes Intravenous  Once 10/23/16 1842 10/23/16 2131        Subjective:   Enis Riecke was seen and examined today.  Very confused, rambling, rapid and pressured speech, wife at the bedside  Objective:   Vitals:   11/12/16 0408 11/12/16 0559 11/12/16 0729 11/12/16 0805  BP: (!) 158/82  (!) 141/86   Pulse: 75  77   Resp: 16  (!) 21   Temp: 98.5 F (36.9 C)  98.6 F (37 C)   TempSrc: Oral  Oral   SpO2:    97%  Weight:  89.9 kg (198 lb 3.1 oz)    Height:        Intake/Output Summary (Last 24 hours) at 11/12/16 1105 Last data filed at 11/11/16 2015  Gross per 24 hour  Intake              360 ml  Output                0 ml  Net              360 ml     Wt Readings from Last 3 Encounters:   11/12/16 89.9 kg (198 lb 3.1 oz)  07/12/16 99.8 kg (220 lb)  11/18/15 101.1 kg (222 lb 14.2 oz)     Exam  General: Confused, rapid and rambling oriented to himself  HEENT:  PERRLA, EOMI  Neck: Supple, - JVD  Cardiovascular: S1 and S2 present, regular rate and rhythm  Respiratory: CTA B no wheezing  Gastrointestinal: Soft, NT, ND, normal bowel sounds, PD catheter +  Ext: no cyanosis, clubbing, edema  Neuro: no new deficits  Skin: Right foot tender to catch with erythema over the dorsum of the foot, staples intact on the leg  Psych: confused, in psychosis   Data Reviewed:  I have personally reviewed following labs and imaging studies  Micro Results Recent Results (from the past 240 hour(s))  Culture, body fluid-bottle     Status: None (Preliminary result)   Collection Time: 11/09/16  8:36 AM  Result Value Ref Range Status   Specimen Description FLUID PERITONEAL DIALYSIS  Final   Special Requests NONE  Final   Culture NO GROWTH 3 DAYS  Final   Report Status PENDING  Incomplete  Gram stain     Status: None   Collection Time: 11/09/16  8:36 AM  Result Value Ref Range Status   Specimen Description FLUID PERITONEAL DIALYSIS  Final   Special Requests NONE  Final   Gram Stain NO WBC SEEN NO ORGANISMS SEEN   Final   Report Status 11/09/2016 FINAL  Final    Radiology Reports Nm Myocar Multi W/spect W/wall Motion / Ef  Result Date: 10/31/2016  There was no ST  segment deviation noted during stress.  Findings consistent with ischemia.  This is an intermediate risk study.  The left ventricular ejection fraction is mildly decreased (45-54%).  1. EF 43%, inferior hypokinesis. 2. Reversible, medium sized, mild basal to mid inferior and basal inferolateral perfusion defect.  This is concerning for possible ischemia. 3. Intermediate risk study.   Dg Chest Port 1 View  Result Date: 11/06/2016 CLINICAL DATA:  Respiratory failure.  Endotracheal tube present. EXAM: PORTABLE  CHEST 1 VIEW COMPARISON:  11/05/2016 FINDINGS: Endotracheal tube is 5.2 cm above the carina. Nasogastric tube extends into the abdomen. Low lung volumes without focal airspace disease. Negative for a pneumothorax. Heart size is within normal limits. Atherosclerotic calcifications at the aortic arch. IMPRESSION: Low lung volumes without focal lung disease. Support apparatuses as described. Electronically Signed   By: Markus Daft M.D.   On: 11/06/2016 07:42   Dg Chest Port 1 View  Result Date: 11/05/2016 CLINICAL DATA:  Hypoxia EXAM: PORTABLE CHEST 1 VIEW COMPARISON:  December 29, 2015 FINDINGS: Endotracheal tube tip is 2.1 cm above the carina. No pneumothorax. There is no edema or consolidation. Heart is upper normal in size with pulmonary vascularity within normal limits. No adenopathy. There is aortic atherosclerosis. No bone lesions. IMPRESSION: Endotracheal tube as described without evident pneumothorax. No edema or consolidation. There is aortic atherosclerosis. Electronically Signed   By: Lowella Grip III M.D.   On: 11/05/2016 15:44   Dg Abd Portable 1v  Result Date: 11/05/2016 CLINICAL DATA:  Encounter for orogastric tube placement. EXAM: PORTABLE ABDOMEN - 1 VIEW COMPARISON:  11/15/2015 FINDINGS: There is gas within the stomach. Orogastric tube is within the stomach body region. Nonobstructive bowel gas pattern. There is a peritoneal dialysis catheter which is partially visualized. IMPRESSION: Orogastric tube in the stomach. Electronically Signed   By: Markus Daft M.D.   On: 11/05/2016 16:02   Dg Foot Complete Right  Result Date: 10/23/2016 CLINICAL DATA:  Recent foot injury 2 weeks ago with persistent pain and discoloration EXAM: RIGHT FOOT COMPLETE - 3+ VIEW COMPARISON:  None. FINDINGS: No acute fracture or dislocation is noted. Diffuse vascular calcifications are seen. IMPRESSION: No acute abnormality noted. Electronically Signed   By: Inez Catalina M.D.   On: 10/23/2016 17:50    Lab  Data:  CBC:  Recent Labs Lab 11/07/16 0728 11/08/16 0238 11/09/16 0525 11/10/16 0239 11/11/16 0232  WBC 18.3* 17.3* 13.8* 14.3* 13.3*  HGB 9.4* 9.1* 8.2* 8.2* 8.4*  HCT 30.4* 29.6* 26.8* 25.6* 26.1*  MCV 79.6 80.4 79.8 80.0 79.6  PLT 457* 439* 396 428* 845*   Basic Metabolic Panel:  Recent Labs Lab 11/05/16 1510 11/05/16 1704  11/06/16 0529 11/07/16 0728 11/08/16 0238 11/09/16 0525 11/10/16 0239 11/11/16 0232  NA  --   --   < > 139 136 134* 135 134* 134*  K  --   --   < > 3.5 3.3* 2.9* 2.9* 3.0* 3.3*  CL  --   --   < > 96* 95* 97* 96* 95* 96*  CO2  --   --   < > 23 25 23 25 25 24   GLUCOSE  --   --   < > 171* 131* 135* 100* 106* 105*  BUN  --   --   < > 42* 49* 51* 51* 51* 52*  CREATININE  --   --   < > 11.27* 11.35* 10.99* 10.76* 11.02* 10.58*  CALCIUM  --   --   < >  7.2* 7.1* 7.0* 7.3* 7.3* 7.3*  MG 1.9 2.0  --  2.0  --   --   --  1.9  --   PHOS 7.6* 7.0*  --  7.6*  --  7.0* 7.0* 6.3*  --   < > = values in this interval not displayed. GFR: Estimated Creatinine Clearance: 10.1 mL/min (A) (by C-G formula based on SCr of 10.58 mg/dL (H)). Liver Function Tests:  Recent Labs Lab 11/08/16 0238 11/09/16 0525  ALBUMIN 1.6* 1.5*   No results for input(s): LIPASE, AMYLASE in the last 168 hours. No results for input(s): AMMONIA in the last 168 hours. Coagulation Profile: No results for input(s): INR, PROTIME in the last 168 hours. Cardiac Enzymes:  Recent Labs Lab 11/05/16 1510  TROPONINI 0.08*   BNP (last 3 results) No results for input(s): PROBNP in the last 8760 hours. HbA1C: No results for input(s): HGBA1C in the last 72 hours. CBG:  Recent Labs Lab 11/11/16 1753 11/11/16 2211 11/12/16 0002 11/12/16 0406 11/12/16 0732  GLUCAP 74 102* 102* 113* 105*   Lipid Profile: No results for input(s): CHOL, HDL, LDLCALC, TRIG, CHOLHDL, LDLDIRECT in the last 72 hours. Thyroid Function Tests: No results for input(s): TSH, T4TOTAL, FREET4, T3FREE, THYROIDAB  in the last 72 hours. Anemia Panel: No results for input(s): VITAMINB12, FOLATE, FERRITIN, TIBC, IRON, RETICCTPCT in the last 72 hours. Urine analysis:    Component Value Date/Time   COLORURINE AMBER (A) 11/04/2015 1930   APPEARANCEUR TURBID (A) 11/04/2015 1930   LABSPEC 1.019 11/04/2015 1930   PHURINE 5.5 11/04/2015 1930   GLUCOSEU 100 (A) 11/04/2015 1930   HGBUR NEGATIVE 11/04/2015 1930   BILIRUBINUR NEGATIVE 11/04/2015 1930   KETONESUR NEGATIVE 11/04/2015 1930   PROTEINUR >300 (A) 11/04/2015 1930   UROBILINOGEN 0.2 09/22/2012 1805   NITRITE NEGATIVE 11/04/2015 1930   LEUKOCYTESUR NEGATIVE 11/04/2015 1930     Amyla Heffner M.D. [facility] 11/12/2016, 11:05 AM  Pager: [phone] Between 7am to 7pm - call Pager - [phone]  After 7pm go to [email or web address] - password [email or web address]  Call night coverage person covering after 7pm

## 2016-11-12 NOTE — Progress Notes (Signed)
Occupational Therapy Treatment Patient Details Name: Hayden [name] MRN: [id number] DOB: March 13, 1971 Today's Date: 11/12/2016    History of present illness Pt is a 46 y.o. M  admitted 4/25 for dry gangrene and cellulitis on the right foot./Right great toe,  and is now s/p  right CFA to below knee popliteal artery bypass, right first toe amputation, right dorsal ulcer debridement performed on 5/8.  He was extubated morning of 5/9. Pt with post-op encephalopathy and agitation. PMH - ESRD on peritoneal dialysis, HTN, CVA, kidney CA.   OT comments  Assisted RN and PT with turning pt to adjust bed linens (max A), and with bathing- requires total A.   He will follow simple one step commands inconsistently.  Will continue to follow as mental status allows   Follow Up Recommendations  CIR;Supervision/Assistance - 24 hour    Equipment Recommendations  Other (comment)    Recommendations for Other Services      Precautions / Restrictions Precautions Precautions: Fall Precaution Comments: Pt agitated, delusional, hallucinating Restrictions Weight Bearing Restrictions: No       Mobility Bed Mobility Overal bed mobility: Needs Assistance Bed Mobility: Rolling Rolling: Max assist;+2 for physical assistance         General bed mobility comments: Rolling Lt and Rt to adjust bed linens and bathe posterior body.  Required max cues and assist to roll UB and +1 to mange Rt LE to prevent pain.   Scooted up in bed with +3 assist   Transfers                 General transfer comment: Not attempted due to agitation and delerium     Balance                                           ADL either performed or assessed with clinical judgement   ADL           Upper Body Bathing: Total assistance;Bed level Upper Body Bathing Details (indicate cue type and reason): Assisted nsg with bathing pt back  Lower Body Bathing: Total assistance;Bed level Lower Body Bathing  Details (indicate cue type and reason): Assisted RN with bathing peri area                      Functional mobility during ADLs: Maximal assistance       Vision       Perception     Praxis      Cognition Arousal/Alertness: Awake/alert;Lethargic;Suspect due to medications Behavior During Therapy: Anxious;Restless;Impulsive Overall Cognitive Status: Impaired/Different from baseline Area of Impairment: Attention                   Current Attention Level: Focused   Following Commands: Follows one step commands inconsistently       General Comments: Pt with ramblings speech and eyes darting.  He will briefly focus on therapist to follow functional commands inconsistently.  Pt delusional and hallucinating         Exercises     Shoulder Instructions       General Comments      Pertinent Vitals/ Pain       Pain Assessment: Faces Faces Pain Scale: Hurts even more Pain Location: Rt LE  Pain Descriptors / Indicators: Grimacing;Guarding Pain Intervention(s): Monitored during session;Repositioned  Home Living  Prior Functioning/Environment              Frequency  Min 3X/week        Progress Toward Goals  OT Goals(current goals can now be found in the care plan section)  Progress towards OT goals: Not progressing toward goals - comment (due to delerium and agitation )     Plan Discharge plan remains appropriate;Other (comment) (pending improvement with mental status )    Co-evaluation    PT/OT/SLP Co-Evaluation/Treatment: Yes Reason for Co-Treatment: For patient/therapist safety;Necessary to address cognition/behavior during functional activity          AM-PAC PT "6 Clicks" Daily Activity     Outcome Measure   Help from another person eating meals?: Total Help from another person taking care of personal grooming?: Total Help from another person toileting, which includes  using toliet, bedpan, or urinal?: Total Help from another person bathing (including washing, rinsing, drying)?: Total Help from another person to put on and taking off regular upper body clothing?: Total Help from another person to put on and taking off regular lower body clothing?: Total 6 Click Score: 6    End of Session    OT Visit Diagnosis: Pain;Cognitive communication deficit (R41.841) Pain - Right/Left: Right Pain - part of body: Leg   Activity Tolerance Patient tolerated treatment well (but limited by delerium and agitation )   Patient Left in bed;with call bell/phone within reach;with bed alarm set   Nurse Communication          Time: 2836-6294 OT Time Calculation (min): 24 min  Charges: OT General Charges $OT Visit: 1 Procedure OT Evaluation $OT Eval Low Complexity: 1 Procedure OT Treatments $Therapeutic Activity: 8-22 mins  [facility], OTR/L [phone]    Lucille Passy M 11/12/2016, 2:24 PM

## 2016-11-12 NOTE — Progress Notes (Signed)
Dialysis treatment terminated d/t infiltration at venous site.  11 mL ultrafiltrated and net fluid removal -489 mL.    Patient status unchanged. Lung sounds clear to ausculation in all fields. RLE edema. Cardiac: NSR, HB.  Disconnected lines and removed needles.  Pressure held for 10 minutes and band aid/gauze dressing applied.  Report given to bedside RN, Tammy.

## 2016-11-12 NOTE — Progress Notes (Signed)
Patient arrived to unit per bed.  Reviewed treatment plan and this RN agrees.  Report received from bedside RN, Tammy.  Patient oriented to self only. Lung sounds clear to ausculation in all fields. RLE edema. Cardiac: NSR, HB.  Prepped LLAVF with alcohol and cannulated with two 15 gauge needles.  Pulsation of blood noted.  Flushed access well with saline per protocol.  Connected and secured lines and initiated tx at 1538.  UF goal of 500 mL and net fluid removal of 0 mL.  Will continue to monitor.

## 2016-11-12 NOTE — Progress Notes (Signed)
   Daily Progress Note   Assessment/Planning:   POD #7 s/p R CFA to BK pop w/ ips NR GSV, R 1st ray amp   Pt again delirious  R foot unchanged with strong signal  R calf incision dressed without active bleeding  Some staple reaction in vein harvest: d/c staples in another 3-7 days  Unclear etiology of delirium  Will have Dr. Sharol Given take a look at R foot.  Suspect maybe too early to make an decisions on the foot.   Subjective  - 7 Days Post-Op   Confused rapid speech   Objective   Vitals:   11/12/16 0000 11/12/16 0408 11/12/16 0559 11/12/16 0729  BP: (!) 158/83 (!) 158/82  (!) 141/86  Pulse: 74 75  77  Resp: 12 16  (!) 21  Temp:  98.5 F (36.9 C)  98.6 F (37 C)  TempSrc:  Oral  Oral  SpO2: 97%     Weight:   198 lb 3.1 oz (89.9 kg)   Height:         Intake/Output Summary (Last 24 hours) at 11/12/16 0757 Last data filed at 11/11/16 2015  Gross per 24 hour  Intake            15582 ml  Output            16590 ml  Net            -1008 ml     PULM  CTAB  CV  RRR  GI  soft, NTND  VASC R groin inc most intact, without drainage; vein harvest incision c/d/I, stapled, no active bleeding, R calf inc: some blistering, no active bleeding, R 1st ray amp incision intact, some coag blood around, mild erythema, and limited blistering on dorsum  NEURO Confused, moving limb spont    Laboratory CBC CBC Latest Ref Rng & Units 11/11/2016 11/10/2016 11/09/2016  WBC 4.0 - 10.5 K/uL 13.3(H) 14.3(H) 13.8(H)  Hemoglobin 13.0 - 17.0 g/dL 8.4(L) 8.2(L) 8.2(L)  Hematocrit 39.0 - 52.0 % 26.1(L) 25.6(L) 26.8(L)  Platelets 150 - 400 K/uL 441(H) 428(H) 396     BMET    Component Value Date/Time   NA 134 (L) 11/11/2016 0232   K 3.3 (L) 11/11/2016 0232   CL 96 (L) 11/11/2016 0232   CO2 24 11/11/2016 0232   GLUCOSE 105 (H) 11/11/2016 0232   BUN 52 (H) 11/11/2016 0232   CREATININE 10.58 (H) 11/11/2016 0232   CALCIUM 7.3 (L) 11/11/2016 0232   GFRNONAA 5 (L) 11/11/2016 0232   GFRAA  6 (L) 11/11/2016 0232      Adele Barthel, MD, FACS [facility] Office: [phone] Pager: [phone]  11/12/2016, 7:57 AM

## 2016-11-12 NOTE — Consult Note (Signed)
Physical Medicine and Rehabilitation Consult Reason for Consult: Decreased functional mobility Referring Physician: [facility]   HPI: Hayden [name] is a 46 y.o. 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 Per chart review and wife, patient lives with his spouse was independent with a cane prior to admission. Mobile home with 4 steps to entry. Wife works a swing shift. Presented 10/23/2016 with right foot redness and pain. Initial injury happened  when he caught his foot and the track of the backseat of his sisters minivan. He initially tried to keep the area clean and dry. He was given a dose of vancomycin in the emergency room but left AMA. Findings a WBC elevated 14,600, lactic acid 0.73. X-rays of the right foot show no acute abnormalities. He was placed on broad-spectrum antibiotics. 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 The anterior tibial artery appears occluded. Underwent angioplasty right superficial femoral artery per vascular surgery 11/05/2016 per Dr. Bridgett Larsson after receiving cardiac clearance as well as right first digit amputation and dorsal ulcer debridement. Hospital course neurology consulted for altered mental status and questionable seizure EEG showed nonspecific cerebral dysfunction and encephalopathy no seizure activity noted and maintained on Depakote. Patient did require short-term intubation extubated 11/06/2016. Dialysis ongoing as per renal services. Patient with intermittent bouts of agitation and restlessness. Subcutaneous heparin for DVT prophylaxis. Physical therapy evaluation completed with recommendations of physical medicine rehabilitation consult.   Review of Systems  Unable to perform ROS: Acuity of condition    Past Medical History:  Diagnosis Date  . Anemia March 2014  . Cancer ([place])    Kidney  . CHF (congestive heart failure) ([place]) 6606   Acute systolic and diastolic CHF  . Depression    & rage --  was in counseling....great now  . Diabetes mellitus    NO DM SINCE LOST 130LBS  . ESRD on peritoneal dialysis ([place]) 2018   Started in-center HD approx 2015 for 2 years, then did about 1 year of home HD and then started peritoneal dialysis in early 2018.    Marland Kitchen History of kidney cancer   . Hyperlipidemia   . Hypertension   . Obesity   . PONV (postoperative nausea and vomiting)   . Stroke [facility])    Past Surgical History:  Procedure Laterality Date  . ABDOMINAL AORTOGRAM W/LOWER EXTREMITY N/A 10/28/2016   Procedure: Abdominal Aortogram w/Lower Extremity;  Surgeon: Angelia Mould, MD;  Location: [place] [facility] LAB;  Service: Cardiovascular;  Laterality: N/A;  . AMPUTATION Right 11/05/2016   Procedure: AMPUTATION RIGHT FIRST RAY;  Surgeon: Conrad [place]ville, MD;  Location: [facility] OR;  Service: Vascular;  Laterality: Right;  . AV FISTULA PLACEMENT Left 01/26/2013   Procedure: ARTERIOVENOUS (AV) FISTULA CREATION - LEFT RADIAL CEPHALIC AVF;  Surgeon: Angelia Mould, MD;  Location: [facility];  Service: Vascular;  Laterality: Left;  . CHOLECYSTECTOMY  11/06/2015   Procedure: LAPAROSCOPIC CHOLECYSTECTOMY;  Surgeon: Ralene Ok, MD;  Location: [place];  Service: General;;  . FEMORAL-POPLITEAL BYPASS GRAFT Right 11/05/2016   Procedure: BYPASS GRAFT FEMORAL-POPLITEAL ARTERY USING NON-REVERSED RIGHT GREATER SAPPHENOUS VEIN;  Surgeon: Conrad Hurley, MD;  Location: [place];  Service: Vascular;  Laterality: Right;  . HERNIA REPAIR    . LOWER EXTREMITY ANGIOGRAM Right 10/30/2016  Procedure: Right  LOWER EXTREMITY ANGIOGRAM WITH RIGHT SUPERFICIAL FEMORAL ARTERY balloon angioplasty;  Surgeon: Conrad Blomkest, MD;  Location: [place];  Service: Vascular;  Laterality: Right;  . NEPHRECTOMY Right 2008   partial  . TESTICLE  TORSION REDUCTION    . TONSILLECTOMY AND ADENOIDECTOMY    . VEIN HARVEST Right 11/05/2016   Procedure: RIGHT GREATER SAPPHENOUS VEIN HARVEST;  Surgeon: Conrad [place], MD;  Location: [facility] OR;  Service: Vascular;  Laterality: Right;   Family History  Problem Relation Age of Onset  . Heart disease Mother        Heart Disease before age 77  . Deep vein thrombosis Father   . Heart attack Father   . Other Other    Social History:  reports that he has been smoking Cigarettes.  He has a 2.50 pack-year smoking history. He has never used smokeless tobacco. He reports that he uses drugs, including Marijuana. He reports that he does not drink alcohol. Allergies:  Allergies  Allergen Reactions  . Dilaudid [Hydromorphone Hcl] Other (See Comments)    ABNORMAL BEHAVIORS "VERBALLY AND PHYSICALLY ABUSIVE" PSYCHOSIS  . Morphine And Related Itching and Other (See Comments)    SYNCOPE   Medications Prior to Admission  Medication Sig Dispense Refill  . ALPRAZolam (XANAX) 0.5 MG tablet Take 0.5-1 mg by mouth 2 (two) times daily as needed for anxiety.     . ALPRAZolam (XANAX) 1 MG tablet Take 1 mg by mouth at bedtime as needed for anxiety.    Marland Kitchen amLODipine (NORVASC) 10 MG tablet Take 10 mg by mouth daily.    Marland Kitchen aspirin EC 81 MG tablet Take 81 mg by mouth daily.    Marland Kitchen buPROPion (WELLBUTRIN XL) 150 MG 24 hr tablet Take 150-300 mg by mouth daily. 150 mg in the morning, 300 mg at bedtime    . cinacalcet (SENSIPAR) 60 MG tablet Take 60 mg by mouth daily.    . clindamycin (CLEOCIN) 300 MG capsule Take 300 mg by mouth 4 (four) times daily.    . cloNIDine (CATAPRES - DOSED IN MG/24 HR) 0.2 mg/24hr patch Place 1 patch onto the skin once a week.    . hydrALAZINE (APRESOLINE) 100 MG tablet Take 100 mg by mouth 2 (two) times daily.    . isosorbide dinitrate (ISORDIL) 10 MG tablet Take 10 mg by mouth daily.    Marland Kitchen labetalol (NORMODYNE) 100 MG tablet Take 150 mg by mouth 2 (two) times daily.     Marland Kitchen omeprazole (PRILOSEC) 40 MG  capsule Take 40 mg by mouth daily.    Marland Kitchen PARoxetine (PAXIL) 20 MG tablet Take 40 mg by mouth daily.     . pravastatin (PRAVACHOL) 20 MG tablet Take 20 mg by mouth daily.    . Vitamin D, Ergocalciferol, (DRISDOL) 50000 units CAPS capsule Take 50,000 Units by mouth every 7 (seven) days. Every 7 days; on Thursday    . buPROPion (WELLBUTRIN SR) 150 MG 12 hr tablet Take 150-300 mg by mouth See admin instructions. 150 mg in the morning and 300 mg at bedtime    . calcium acetate (PHOSLO) 667 MG capsule Take 2 capsules (1,334 mg total) by mouth 3 (three) times daily with meals. (Patient not taking: Reported on 10/24/2016) 120 capsule 0  . furosemide (LASIX) 80 MG tablet Take 80 mg by mouth 2 (two) times daily.    Marland Kitchen saccharomyces boulardii (FLORASTOR) 250 MG capsule Take 1 capsule (250 mg total) by mouth 2 (two) times daily. (Patient  not taking: Reported on 10/24/2016) 30 capsule 0    Home: Home Living Family/patient expects to be discharged to:: Private residence Living Arrangements: Spouse/significant other Available Help at Discharge: Family, Available PRN/intermittently (wife works swing shift) Type of Home: Mobile home Home Access: Stairs to enter Technical brewer of Steps: 4 Entrance Stairs-Rails: Right, Left Home Layout: One level Bathroom Shower/Tub: Gaffer, Door [employer] Toilet: Standard Home Equipment: Cane - single point, Grab bars - tub/shower, Hand held shower head Additional Comments: patient unable to relate prior level/home info, above taken from admission last year.   Functional History: Prior Function Level of Independence: Independent Functional Status:  Mobility: Bed Mobility Overal bed mobility: Needs Assistance Bed Mobility: Supine to Sit Rolling: Min assist Supine to sit: +2 for physical assistance, HOB elevated, Mod assist Sit to supine: Mod assist General bed mobility comments: Pt determined to attempt himself, initially would not allow therapist to  assist.  He did require assist to lift trunk and to scoot hips to EOB  Transfers Overall transfer level: Needs assistance Equipment used: Ambulation equipment used Transfer via Lift Equipment: Stedy Transfers: Sit to/from Stand, [employer] Transfers Sit to Stand: Mod assist, +2 physical assistance Stand pivot transfers: Mod assist, +2 physical assistance General transfer comment: Pt required an extensive amount of time to coax into transferring.  He initially attempted to stand, but was unable.  Pt delusional and at times hallucinating, with speech at times nonsensical.  Once it was determined that pain was limiting his mobility, the RN provided pain meds, and pt then able to assist with sit to stand - requires assist to boost into standing and assist for balance.  Ambulation/Gait Ambulation/Gait assistance: +2 physical assistance, Mod assist Ambulation Distance (Feet): 2 Feet Assistive device: Rolling walker (2 wheeled) Gait Pattern/deviations: Step-to pattern, Trunk flexed (hop to) General Gait Details: Assist for pivotal steps from bed to recliner Gait velocity: decr Gait velocity interpretation: Below normal speed for age/gender    ADL: ADL Overall ADL's : Needs assistance/impaired Eating/Feeding: Set up, Sitting Grooming: Minimal assistance, Sitting Upper Body Bathing: Moderate assistance, Sitting Lower Body Bathing: Maximal assistance, Sit to/from stand Upper Body Dressing : Moderate assistance, Sitting Lower Body Dressing: Maximal assistance, Sit to/from stand Toilet Transfer: Moderate assistance, +2 for safety/equipment, Stand-pivot (stedy ) Toilet Transfer Details (indicate cue type and reason): Simulated by stand pivot from EOB to chair Functional mobility during ADLs: Moderate assistance, +2 for physical assistance  Cognition: Cognition Overall Cognitive Status: Impaired/Different from baseline Orientation Level: Oriented to person, Disoriented to situation, Oriented to  time, Disoriented to place Cognition Arousal/Alertness: Awake/alert Behavior During Therapy: Anxious, Restless Overall Cognitive Status: Impaired/Different from baseline Area of Impairment: Orientation, Attention, Following commands, Memory, Safety/judgement, Awareness, Problem solving Orientation Level: Disoriented to, Place, Time, Situation Current Attention Level: Sustained Memory: Decreased short-term memory, Decreased recall of precautions Following Commands: Follows one step commands consistently Safety/Judgement: Decreased awareness of safety, Decreased awareness of deficits Problem Solving: Slow processing, Decreased initiation, Difficulty sequencing, Requires verbal cues, Requires tactile cues General Comments: Patient not as agitated as earlier.  Was able to follow commands for exercises.  Blood pressure (!) 158/82, pulse 75, temperature 98.5 F (36.9 C), temperature source Oral, resp. rate 16, height 5\' 10"  (1.778 m), weight 89.9 kg (198 lb 3.1 oz), SpO2 97 %. Physical Exam  Vitals reviewed. Constitutional: He appears well-developed and well-nourished.  46 year old male sitting up in bed with wife at bedside.  HENT:  Head: Normocephalic and atraumatic.  Eyes: EOM are normal.  Right eye exhibits no discharge. Left eye exhibits no discharge.  Neck: Normal range of motion. Neck supple. No thyromegaly present.  Cardiovascular: Normal rate and regular rhythm.   Respiratory: Effort normal and breath sounds normal. No respiratory distress.  GI: Soft. Bowel sounds are normal. He exhibits no distension.  Musculoskeletal: He exhibits edema and tenderness.  Amputation 1st digit RLE  Neurological: He is alert.  Patient is very anxious and distracted.  A&Ox2, confused Motor: B/l UE, LLE: 4+/5 proximal to distal RLE: 2/5 proximal to distal (pain inhibition)  Skin:  Right foot amputation vascular surgery sites clean and dry  Psychiatric: His mood appears anxious. His affect is labile.  His speech is rapid and/or pressured and tangential. He is hyperactive. Thought content is delusional. Cognition and memory are impaired. He expresses impulsivity.    Results for orders placed or performed during the hospital encounter of 10/23/16 (from the past 24 hour(s))  Glucose, capillary     Status: Abnormal   Collection Time: 11/11/16 12:07 PM  Result Value Ref Range   Glucose-Capillary 235 (H) 65 - 99 mg/dL  Glucose, capillary     Status: Abnormal   Collection Time: 11/11/16  4:33 PM  Result Value Ref Range   Glucose-Capillary 61 (L) 65 - 99 mg/dL  Glucose, capillary     Status: None   Collection Time: 11/11/16  5:53 PM  Result Value Ref Range   Glucose-Capillary 74 65 - 99 mg/dL  Glucose, capillary     Status: Abnormal   Collection Time: 11/11/16 10:11 PM  Result Value Ref Range   Glucose-Capillary 102 (H) 65 - 99 mg/dL  Glucose, capillary     Status: Abnormal   Collection Time: 11/12/16 12:02 AM  Result Value Ref Range   Glucose-Capillary 102 (H) 65 - 99 mg/dL   Comment 1 Notify RN    Comment 2 Document in Chart   Glucose, capillary     Status: Abnormal   Collection Time: 11/12/16  4:06 AM  Result Value Ref Range   Glucose-Capillary 113 (H) 65 - 99 mg/dL   Comment 1 Notify RN    Comment 2 Document in Chart    No results found.  Assessment/Plan: Diagnosis: Right first digit amputation Labs independently reviewed.  Records reviewed and summated above.  1. Does the need for close, 24 hr/day medical supervision in concert with the patient's rehab needs make it unreasonable for this patient to be served in a less intensive setting? Yes 2. 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 (recs per neprho), tobacco abuse (counsel when appropriate), acute on chronic congestive heart failure (Monitor in accordance with increased physical activity and avoid UE resistance excercises), HTN (monitor and provide prns in accordance with  increased physical exertion and pain), CVA, encephalopathy, post-op pain (Biofeedback training with therapies to help reduce reliance on opiate pain medications, particularly IV fentanyl, monitor pain control during therapies, and sedation at rest and titrate to maximum efficacy to ensure participation and gains in therapies), leukocytosis (cont to monitor for signs and symptoms of infection, further workup if indicated) 3. Due to bladder management, bowel management, safety, skin/wound care, disease management, medication administration, pain management and patient education, does the patient require 24 hr/day rehab nursing? Yes 4. Does the patient require coordinated care of a physician, rehab nurse, PT (1-2 hrs/day, 5 days/week), OT (1-2 hrs/day, 5 days/week) and SLP (1-2 hrs/day, 5 days/week) to address physical and functional deficits in the context of the above medical diagnosis(es)? Yes  Addressing deficits in the following areas: balance, endurance, locomotion, strength, transferring, bowel/bladder control, bathing, dressing, toileting, cognition and psychosocial support 5. Can the patient actively participate in an intensive therapy program of at least 3 hrs of therapy per day at least 5 days per week? Potentially 6. The potential for patient to make measurable gains while on inpatient rehab is excellent 7. Anticipated functional outcomes upon discharge from inpatient rehab are supervision  with PT, supervision with OT, supervision and min assist with SLP. 8. Estimated rehab length of stay to reach the above functional goals is: 10-14 days. 9. Anticipated D/C setting: Home 10. Anticipated post D/C treatments: HH therapy and Home excercise program 11. Overall Rehab/Functional Prognosis: good  RECOMMENDATIONS: This patient's condition is appropriate for continued rehabilitative care in the following setting: Will await final plans for surgical intervention as well as improvement in mental status.   Recommend weaning cognitively altering meds as tolerated.  If patient continues to have funcional deficits, recommend CIR. Patient has agreed to participate in recommended program. Potentially Note that insurance prior authorization may be required for reimbursement for recommended care.  Comment: Rehab Admissions Coordinator to follow up.  Delice Lesch, MD, Mellody Drown Cathlyn Parsons., PA-C 11/12/2016

## 2016-11-13 LAB — CBC
HEMATOCRIT: 27.2 % — AB (ref 39.0–52.0)
HEMOGLOBIN: 8.5 g/dL — AB (ref 13.0–17.0)
MCH: 25.3 pg — ABNORMAL LOW (ref 26.0–34.0)
MCHC: 31.3 g/dL (ref 30.0–36.0)
MCV: 81 fL (ref 78.0–100.0)
Platelets: 524 10*3/uL — ABNORMAL HIGH (ref 150–400)
RBC: 3.36 MIL/uL — ABNORMAL LOW (ref 4.22–5.81)
RDW: 21.5 % — ABNORMAL HIGH (ref 11.5–15.5)
WBC: 13 10*3/uL — AB (ref 4.0–10.5)

## 2016-11-13 LAB — BASIC METABOLIC PANEL
ANION GAP: 13 (ref 5–15)
BUN: 53 mg/dL — ABNORMAL HIGH (ref 6–20)
CHLORIDE: 96 mmol/L — AB (ref 101–111)
CO2: 27 mmol/L (ref 22–32)
Calcium: 7.6 mg/dL — ABNORMAL LOW (ref 8.9–10.3)
Creatinine, Ser: 9.68 mg/dL — ABNORMAL HIGH (ref 0.61–1.24)
GFR calc non Af Amer: 6 mL/min — ABNORMAL LOW (ref >60)
GFR, EST AFRICAN AMERICAN: 7 mL/min — AB (ref >60)
GLUCOSE: 90 mg/dL (ref 65–99)
Potassium: 3.5 mmol/L (ref 3.5–5.1)
Sodium: 136 mmol/L (ref 135–145)

## 2016-11-13 LAB — GLUCOSE, CAPILLARY
GLUCOSE-CAPILLARY: 71 mg/dL (ref 65–99)
Glucose-Capillary: 89 mg/dL (ref 65–99)

## 2016-11-13 LAB — VALPROIC ACID LEVEL: VALPROIC ACID LVL: 16 ug/mL — AB (ref 50.0–100.0)

## 2016-11-13 LAB — AMMONIA: Ammonia: 30 umol/L (ref 9–35)

## 2016-11-13 MED ORDER — SODIUM CHLORIDE 0.9 % IV SOLN: 100.0000 mL | INTRAVENOUS | Status: DC | PRN

## 2016-11-13 MED ORDER — LORAZEPAM 2 MG/ML IJ SOLN
1.0000 mg | INTRAMUSCULAR | Status: AC | PRN
  Administered 2016-11-13: 1 mg via INTRAVENOUS

## 2016-11-13 MED ORDER — LIDOCAINE HCL (PF) 1 % IJ SOLN: 5.0000 mL | INTRAMUSCULAR | Status: DC | PRN

## 2016-11-13 MED ORDER — LIDOCAINE HCL (PF) 1 % IJ SOLN
INTRAMUSCULAR | Status: AC
  Filled 2016-11-13: qty 30

## 2016-11-13 MED ORDER — HEPARIN SODIUM (PORCINE) 1000 UNIT/ML DIALYSIS: 1000.0000 [IU] | INTRAMUSCULAR | Status: DC | PRN

## 2016-11-13 MED ORDER — LORAZEPAM 2 MG/ML IJ SOLN
1.0000 mg | Freq: Once | INTRAMUSCULAR | Status: AC
Start: 2016-11-13 — End: 2016-11-13
  Administered 2016-11-13: 1 mg via INTRAVENOUS

## 2016-11-13 MED ORDER — HEPARIN SODIUM (PORCINE) 1000 UNIT/ML DIALYSIS: 3000.0000 [IU] | INTRAMUSCULAR | Status: DC | PRN

## 2016-11-13 MED ORDER — PENTAFLUOROPROP-TETRAFLUOROETH EX AERO: 1.0000 "application " | INHALATION_SPRAY | CUTANEOUS | Status: DC | PRN

## 2016-11-13 MED ORDER — LORAZEPAM 2 MG/ML IJ SOLN
INTRAMUSCULAR | Status: AC
  Administered 2016-11-13: 1 mg via INTRAVENOUS
  Filled 2016-11-13: qty 1

## 2016-11-13 MED ORDER — ALTEPLASE 2 MG IJ SOLR: 2.0000 mg | Freq: Once | INTRAMUSCULAR | Status: DC | PRN

## 2016-11-13 MED ORDER — LIDOCAINE-PRILOCAINE 2.5-2.5 % EX CREA: 1.0000 "application " | TOPICAL_CREAM | CUTANEOUS | Status: DC | PRN

## 2016-11-13 MED ORDER — VANCOMYCIN HCL IN DEXTROSE 1-5 GM/200ML-% IV SOLN
1000.0000 mg | Freq: Once | INTRAVENOUS | Status: AC
  Administered 2016-11-13: 1000 mg via INTRAVENOUS
  Filled 2016-11-13: qty 200

## 2016-11-13 NOTE — Procedures (Signed)
Under sterile conditions and using US guidance, a 20 cm 3-lumen temp HD catheter was placed into the L femoral vein w/o complications.  Looks dry by sluggish venous return.  Ready to use.    Kelly Splinter MD [employer] pgr [phone]   11/13/2016, 1:48 PM

## 2016-11-13 NOTE — Progress Notes (Signed)
Ativan 1mg  given @ 11am prior to HD catheter placement.

## 2016-11-13 NOTE — Progress Notes (Signed)
[facility] Progress Note   Subjective: still confused and agitated.   Vitals:   11/13/16 0800 11/13/16 1200 11/13/16 1250 11/13/16 1300  BP: (!) 168/88 (!) 153/88 (!) 185/88 (!) 192/93  Pulse: 74 78 79 81  Resp: 12 15 16    Temp: 97.8 F (36.6 C) 97.8 F (36.6 C) 98 F (36.7 C)   TempSrc: Oral Oral Oral   SpO2: 96% 96%    Weight: 85 kg (187 lb 6.3 oz) 86.3 kg (190 lb 4.1 oz)    Height:        Inpatient medications: . amLODipine  5 mg Oral Daily  . aspirin  81 mg Oral Daily  . budesonide (PULMICORT) nebulizer solution  0.5 mg Nebulization BID  . buPROPion  300 mg Oral q morning - 10a  . calcitRIOL  0.5 mcg Oral Daily  . cinacalcet  60 mg Oral Q supper  . cloNIDine  0.2 mg Transdermal Weekly  . darbepoetin (ARANESP) injection - NON-DIALYSIS  200 mcg Subcutaneous Q Sun-1800  . docusate sodium  100 mg Oral Daily  . feeding supplement (NEPRO CARB STEADY)  237 mL Oral BID BM  . feeding supplement (PRO-STAT SUGAR FREE 64)  30 mL Oral BID  . ferric citrate  420 mg Oral TID WC  . heparin  5,000 Units Subcutaneous Q8H  . insulin aspart  0-9 Units Subcutaneous Q4H  . isosorbide dinitrate  5 mg Oral BID  . labetalol  200 mg Oral BID  . lidocaine (PF)      . LORazepam  1 mg Intravenous Q6H  . mouth rinse  15 mL Mouth Rinse BID  . pantoprazole  40 mg Oral Daily  . pravastatin  20 mg Oral Daily  . saccharomyces boulardii  250 mg Oral BID  . sodium chloride flush  3 mL Intravenous Q12H   . sodium chloride Stopped (11/05/16 1412)  . sodium chloride    . sodium chloride    . valproate sodium 500 mg (11/13/16 0900)  . vancomycin     sodium chloride, sodium chloride, sodium chloride, ALPRAZolam, alteplase, fentaNYL (SUBLIMAZE) injection, Gerhardt's butt cream, dianeal solution for CAPD/CCPD with heparin, heparin, [START ON 11/14/2016] heparin, hydrALAZINE, HYDROcodone-acetaminophen, lidocaine (PF), lidocaine-prilocaine, LORazepam, pentafluoroprop-tetrafluoroeth,  prochlorperazine  Exam: Licensed conveyancer of ideas, rambling on continuous No jvd Chest clear bilat RRR no murmur ABd obese, soft ntnd, PD cath in place Ext RLE edema post-op, R 1st toe amp and wounds from fem-po healing well. No LLE edema L forearm AVF +bruit  Dialysis: [facility].  CCPD: 3L fill 5 exchanges at night with 5th fill manually drained 1-1/2 hours later. Uses mostly 1's and 2's at home.       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  Not sure cause, could have some underdialysis on PD.  Trial of hemodialysis starting today via temp cath.  3  ESRD holding PD while on HD 4  Vol looks dry 5  Abnormal myoview - per cards prob inaccurate, no further invest 6  Anemia cont darbe , ^'d 200/wk  Plan - HD today and tomorrow, no fluid off, watch to see if MS improves   Kelly Splinter MD [facility] pager [phone]   11/13/2016, 1:44 PM    Recent Labs Lab 11/09/16 0525 11/10/16 0239 11/11/16 0232 11/12/16 1530 11/13/16 0333  NA 135 134* 134* 133* 136  K 2.9* 3.0* 3.3* 3.4* 3.5  CL 96* 95* 96* 97* 96*  CO2 25 25 24 23 27   GLUCOSE 100* 106* 105* 75 90  BUN 51* 51* 52* 58* 53*  CREATININE 10.76* 11.02* 10.58* 10.27* 9.68*  CALCIUM 7.3* 7.3* 7.3* 7.3* 7.6*  PHOS 7.0* 6.3*  --  5.4*  --     Recent Labs Lab 11/08/16 0238 11/09/16 0525 11/12/16 1530  ALBUMIN 1.6* 1.5* 1.5*    Recent Labs Lab 11/11/16 0232 11/12/16 1530 11/13/16 0333  WBC 13.3* 13.4* 13.0*  HGB 8.4* 8.0* 8.5*  HCT 26.1* 24.9* 27.2*  MCV 79.6 80.1 81.0  PLT 441* 495* 524*   Iron/TIBC/Ferritin/ %Sat    Component Value Date/Time   IRON 7 (L) 11/15/2015 1342   TIBC 164 (L) 11/15/2015 1342   FERRITIN 1,444 (H) 11/15/2015 1342   IRONPCTSAT 4 (L) 11/15/2015 1342
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
## 2016-11-13 NOTE — Progress Notes (Signed)
Pharmacy Antibiotic Note  Hayden [name] is a 46 y.o. male admitted on 10/23/2016 with cellulitis.  Pharmacy has been consulted for vancomycin dosing.  VR pre HD was therapeutic at 15 on 5/15. Did not have HD on 5/15 but did have session today with a BFR of 411ml/min x 2 hrs.    Plan: Give vancomycin 1g IV x 1 tonight F/U further HD sessions  Height: 5\' 10"  (177.8 cm) Weight: 190 lb 4.1 oz (86.3 kg) IBW/kg (Calculated) : 73  Temp (24hrs), Avg:97.9 F (36.6 C), Min:97.4 F (36.3 C), Max:98.7 F (37.1 C)   Recent Labs Lab 11/09/16 0525 11/10/16 0239 11/11/16 0232 11/12/16 1430 11/12/16 1530 11/13/16 0333  WBC 13.8* 14.3* 13.3*  --  13.4* 13.0*  CREATININE 10.76* 11.02* 10.58*  --  10.27* 9.68*  VANCORANDOM 14  --   --  15  --   --     Estimated Creatinine Clearance: 10.1 mL/min (A) (by C-G formula based on SCr of 9.68 mg/dL (H)).    Allergies  Allergen Reactions  . Dilaudid [Hydromorphone Hcl] Other (See Comments)    ABNORMAL BEHAVIORS "VERBALLY AND PHYSICALLY ABUSIVE" PSYCHOSIS  . Morphine And Related Itching and Other (See Comments)    SYNCOPE   Thank you for allowing pharmacy to be a part of this patient's care.  Reginia Naas 11/13/2016 4:30 PM

## 2016-11-13 NOTE — Progress Notes (Signed)
[facility]  PROGRESS NOTE  Hayden [name] VPX:[id number] DOB: 1970-08-26 DOA: 10/23/2016 PCP: Corliss Parish, MD   Brief HPI:   46 y/o male with ESRD on PD, admitted on 4/25 with cellulitis and gangrene of right great toe. He was also noted to be encephalopathic. He was seen by vascular surgery and underwent right great toe amputation and right common femoral artery to below the knee popliteal artery bypass. He is continued on vancomycin. He is undergoing PD per nephrology. Hospital course has been complicated by encephalopathy and worsening delirium, needing precedex infusion. Since being started on precedex, his mental status has started to improve. He has been weaned off precedex and has been started on xanax. Will need physical therapy and possible placement. PT evaluation recommended CIR, inpatient rehabilitation consulted. [facility] assumed care 5/13    Subjective   Patient seen and examined, somnolent but able to follow commands.   Assessment/Plan:     1. Cellulitis, gangrene of right big toe-status post intubation of right big toe 1 11/05/2016. Patient currently on IV vancomycin. Culture showed no growth. Risk of surgery is following. Will defer antibiotics recommendations to vascular surgery 2. Acute encephalopathy/delirium- patient's hospital course has been complicated with delirium he has been seen by neurology and psychiatry during this admission. It was felt that patients encephalopathy could be from withdrawal of medication/alcohol. Antipsychotics were not recommended due to prolonged QTc and he was started on Depakote and lorazepam. After surgery patient's delirium worsened and he was transferred to ICU was placed on Precedex infusion. Currently he is off Precedex and on Xanax, Wellbutrin and Depakote. Patient is going to  dialysis hopefully that will improve encephalopathy. Will also check ammonia level. 3. Peripheral vascular disease- status post right common femoral  artery to below the knee popliteal artery bypass. Vascular surgery following 4. Hypertensive urgency- resolved. Currently blood pressure stable. Continue labetalol, isosorbide, clonidine patch, amlodipine, when necessary hydralazine 5. ESRD-patient started on hemodialysis per nephrology 6. Anemia of chronic disease- hemoglobin and hematocrit currently stable 7. Hyperlipidemia- continue Pravachol 8. Diabetes mellitus type 2- blood glucose is stable, continue sliding scale insulin with NovoLog      DVT prophylaxis: Heparin  Code Status: Full code  Family Communication: Discussed with patient's wife at bedside   Disposition Plan: Pending improvement in mental status   Consultants:  Nephrology  Vascular surgery  [facility] CM  Psychiatry  Cardiology  CIR  Procedures: 4/25 Admit  5/07 Seen by Neurology &Psychiatry  5/08 To OR for right femoropopliteal vs femorotibial bypass &right great toe amputation  5/09 Extubated 5/11 Off precedex  5/13 [facility] assumed care   Continuous infusions . sodium chloride Stopped (11/05/16 1412)  . sodium chloride    . sodium chloride    . valproate sodium 500 mg (11/13/16 0900)  . vancomycin        Antibiotics:   Anti-infectives    Start     Dose/Rate Route Frequency Ordered Stop   11/12/16 1900  vancomycin (VANCOCIN) IVPB 1000 mg/200 mL premix     1,000 mg 200 mL/hr over 60 Minutes Intravenous To Hemodialysis 11/12/16 1648 11/13/16 1900   11/09/16 1000  vancomycin (VANCOCIN) 500 mg in sodium chloride 0.9 % 100 mL IVPB  Status:  Discontinued     500 mg 100 mL/hr over 60 Minutes Intravenous Once every 5 days 11/04/16 1326 11/12/16 1648   11/05/16 0800  piperacillin-tazobactam (ZOSYN) IVPB 2.25 g  Status:  Discontinued     2.25 g 100 mL/hr over 30  Minutes Intravenous To Surgery 11/05/16 0747 11/05/16 1413   11/04/16 2200  piperacillin-tazobactam (ZOSYN) IVPB 2.25 g  Status:  Discontinued     2.25 g 100 mL/hr over 30 Minutes  Intravenous Every 12 hours 11/04/16 1507 11/07/16 1011   11/04/16 1400  vancomycin (VANCOCIN) 500 mg in sodium chloride 0.9 % 100 mL IVPB     500 mg 100 mL/hr over 60 Minutes Intravenous  Once 11/04/16 1317 11/04/16 1615   10/31/16 2000  piperacillin-tazobactam (ZOSYN) IVPB 3.375 g  Status:  Discontinued     3.375 g 12.5 mL/hr over 240 Minutes Intravenous Every 12 hours 10/31/16 0842 11/04/16 1507   10/30/16 1400  vancomycin (VANCOCIN) 500 mg in sodium chloride 0.9 % 100 mL IVPB  Status:  Discontinued     500 mg 100 mL/hr over 60 Minutes Intravenous Once every 6 days 10/30/16 1306 11/04/16 1317   10/30/16 1000  vancomycin (VANCOCIN) 500 mg in sodium chloride 0.9 % 100 mL IVPB  Status:  Discontinued     500 mg 100 mL/hr over 60 Minutes Intravenous Once every 6 days 10/29/16 1114 10/30/16 1306   10/24/16 0600  piperacillin-tazobactam (ZOSYN) IVPB 3.375 g  Status:  Discontinued     3.375 g 12.5 mL/hr over 240 Minutes Intravenous Every 12 hours 10/23/16 1854 10/31/16 0842   10/23/16 1845  piperacillin-tazobactam (ZOSYN) IVPB 3.375 g     3.375 g 100 mL/hr over 30 Minutes Intravenous  Once 10/23/16 1837 10/23/16 1928   10/23/16 1845  vancomycin (VANCOCIN) 2,000 mg in sodium chloride 0.9 % 500 mL IVPB     2,000 mg 250 mL/hr over 120 Minutes Intravenous  Once 10/23/16 1842 10/23/16 2131       Objective   Vitals:   11/13/16 1250 11/13/16 1300 11/13/16 1330 11/13/16 1400  BP: (!) 185/88 (!) 192/93 (!) 192/109 (!) 194/91  Pulse: 79 81 84 81  Resp: 16     Temp: 98 F (36.7 C)     TempSrc: Oral     SpO2:    98%  Weight:      Height:        Intake/Output Summary (Last 24 hours) at 11/13/16 1448 Last data filed at 11/13/16 0900  Gross per 24 hour  Intake            15647 ml  Output            14422 ml  Net             1225 ml   Filed Weights   11/12/16 1543 11/13/16 0800 11/13/16 1200  Weight: 84.8 kg (186 lb 15.2 oz) 85 kg (187 lb 6.3 oz) 86.3 kg (190 lb 4.1 oz)     Physical  Examination:   Physical Exam:  General- somnolent but able to follow commands Neck: Supple, no deformities, masses, or tenderness Lungs: Normal respiratory effort, bilateral clear to auscultation, no crackles or wheezes.  Heart: Regular rate and rhythm, S1 and S2 normal, no murmurs, rubs auscultated Abdomen: BS normoactive,soft,nondistended,non-tender to palpation,no organomegaly  Skin: Status post amputation of right great toe, mild erythema noted at the postop site     Data Reviewed: I have personally reviewed following labs and imaging studies  CBG:  Recent Labs Lab 11/12/16 1657 11/12/16 2003 11/12/16 2344 11/13/16 0350 11/13/16 0850  GLUCAP 78 100* 92 89 71    CBC:  Recent Labs Lab 11/09/16 0525 11/10/16 0239 11/11/16 0232 11/12/16 1530 11/13/16 0333  WBC 13.8* 14.3* 13.3* 13.4* 13.0*  HGB 8.2* 8.2* 8.4* 8.0* 8.5*  HCT 26.8* 25.6* 26.1* 24.9* 27.2*  MCV 79.8 80.0 79.6 80.1 81.0  PLT 396 428* 441* 495* 524*    Basic Metabolic Panel:  Recent Labs Lab 11/08/16 0238 11/09/16 0525 11/10/16 0239 11/11/16 0232 11/12/16 1530 11/13/16 0333  NA 134* 135 134* 134* 133* 136  K 2.9* 2.9* 3.0* 3.3* 3.4* 3.5  CL 97* 96* 95* 96* 97* 96*  CO2 23 25 25 24 23 27   GLUCOSE 135* 100* 106* 105* 75 90  BUN 51* 51* 51* 52* 58* 53*  CREATININE 10.99* 10.76* 11.02* 10.58* 10.27* 9.68*  CALCIUM 7.0* 7.3* 7.3* 7.3* 7.3* 7.6*  MG  --   --  1.9  --   --   --   PHOS 7.0* 7.0* 6.3*  --  5.4*  --     Recent Results (from the past 240 hour(s))  Culture, body fluid-bottle     Status: None (Preliminary result)   Collection Time: 11/09/16  8:36 AM  Result Value Ref Range Status   Specimen Description FLUID PERITONEAL DIALYSIS  Final   Special Requests NONE  Final   Culture NO GROWTH 3 DAYS  Final   Report Status PENDING  Incomplete  Gram stain     Status: None   Collection Time: 11/09/16  8:36 AM  Result Value Ref Range Status   Specimen Description FLUID PERITONEAL  DIALYSIS  Final   Special Requests NONE  Final   Gram Stain NO WBC SEEN NO ORGANISMS SEEN   Final   Report Status 11/09/2016 FINAL  Final     Liver Function Tests:  Recent Labs Lab 11/08/16 0238 11/09/16 0525 11/12/16 1530  ALBUMIN 1.6* 1.5* 1.5*   No results for input(s): LIPASE, AMYLASE in the last 168 hours.  Recent Labs Lab 11/13/16 0920  AMMONIA 30    No   Studies: No results found.  Scheduled Meds: . amLODipine  5 mg Oral Daily  . aspirin  81 mg Oral Daily  . budesonide (PULMICORT) nebulizer solution  0.5 mg Nebulization BID  . buPROPion  300 mg Oral q morning - 10a  . calcitRIOL  0.5 mcg Oral Daily  . cinacalcet  60 mg Oral Q supper  . cloNIDine  0.2 mg Transdermal Weekly  . darbepoetin (ARANESP) injection - NON-DIALYSIS  200 mcg Subcutaneous Q Sun-1800  . docusate sodium  100 mg Oral Daily  . feeding supplement (NEPRO CARB STEADY)  237 mL Oral BID BM  . feeding supplement (PRO-STAT SUGAR FREE 64)  30 mL Oral BID  . ferric citrate  420 mg Oral TID WC  . heparin  5,000 Units Subcutaneous Q8H  . insulin aspart  0-9 Units Subcutaneous Q4H  . isosorbide dinitrate  5 mg Oral BID  . labetalol  200 mg Oral BID  . lidocaine (PF)      . LORazepam  1 mg Intravenous Q6H  . mouth rinse  15 mL Mouth Rinse BID  . pantoprazole  40 mg Oral Daily  . pravastatin  20 mg Oral Daily  . saccharomyces boulardii  250 mg Oral BID  . sodium chloride flush  3 mL Intravenous Q12H      Time spent: 25 min  [facility] Pager [phone]. If 7PM-7AM, please contact night-coverage at [email or web address], Office  [phone]  password [email or web address] 11/13/2016, 2:48 PM  LOS: 20 days

## 2016-11-13 NOTE — Progress Notes (Signed)
Pt. Off floor again to HD when I tried to visit.  Will follow up tomorrow.    [facility] Admissions Coordinator Cell [phone] Office [phone]

## 2016-11-13 NOTE — Procedures (Signed)
Patient was seen on dialysis and the procedure was supervised.  BFR 400  Via new fem cath BP is  192/109.   Patient appears to be tolerating treatment well- BP has been high- AVF infoltrated so placed fem vascath   Hayden [name] A 11/13/2016

## 2016-11-13 NOTE — Progress Notes (Addendum)
  Progress Note    11/13/2016 8:34 AM 8 Days Post-Op  Subjective:  Pt confused and mumbling/sleeping  Afebrile HR 60's-70's NSR 428'J-681'L systolic 57% RA    ABx:  Vanc/Maxipime  Vitals:   11/13/16 0400 11/13/16 0800  BP:  (!) 168/88  Pulse:  74  Resp:  12  Temp: 97.6 F (36.4 C) 97.8 F (36.6 C)    Physical Exam: Cardiac:  regular Lungs:  Non labored Incisions:  Right groin with dry gauze and looks better this morning from 2 days ago; below knee incision with blistering:  Proximal blister burst with some bloody drainage.  Other incisions okay with staples in tact.   Extremities:  Right foot is unchanged from 2 days ago   CBC    Component Value Date/Time   WBC 13.0 (H) 11/13/2016 0333   RBC 3.36 (L) 11/13/2016 0333   HGB 8.5 (L) 11/13/2016 0333   HCT 27.2 (L) 11/13/2016 0333   PLT 524 (H) 11/13/2016 0333   MCV 81.0 11/13/2016 0333   MCH 25.3 (L) 11/13/2016 0333   MCHC 31.3 11/13/2016 0333   RDW 21.5 (H) 11/13/2016 0333   LYMPHSABS 0.9 11/01/2016 0425   MONOABS 1.5 (H) 11/01/2016 0425   EOSABS 1.7 (H) 11/01/2016 0425   BASOSABS 0.1 11/01/2016 0425    BMET    Component Value Date/Time   NA 136 11/13/2016 0333   K 3.5 11/13/2016 0333   CL 96 (L) 11/13/2016 0333   CO2 27 11/13/2016 0333   GLUCOSE 90 11/13/2016 0333   BUN 53 (H) 11/13/2016 0333   CREATININE 9.68 (H) 11/13/2016 0333   CALCIUM 7.6 (L) 11/13/2016 0333   GFRNONAA 6 (L) 11/13/2016 0333   GFRAA 7 (L) 11/13/2016 0333    INR    Component Value Date/Time   INR 1.24 10/30/2016 0614     Intake/Output Summary (Last 24 hours) at 11/13/16 0834 Last data filed at 11/13/16 0755  Gross per 24 hour  Intake            15167 ml  Output            14422 ml  Net              745 ml     Assessment:  46 y.o. male is s/p:  1. Rightcommon femoral artery to below-the-knee popliteal artery bypass with non-reversed ipsilateral greater saphenous vein  2. Right first ray amputation 3. Right  dorsal ulcer debridement  8 Days Post-Op  Plan: -continue to monitor foot - Dr. Sharol Given will be by to evaluate foot -still with leukocytosis but slightly improved.  Pt continues to be afebrile Anemia improving -continued delirium-Psych consult yesterday:  Lexapro and Paxil d/c'd and receiving Lorazepam 1mg  q6h IV for agitation and Depakene 500mg  bid IV for combative behavior. -unable to have HD yesterday-per wife, going to try again today -DVT prophylaxis:  SQ heparin   Leontine Locket, PA-C [facility] [phone] 11/13/2016 8:34 AM   Addendum  I have independently interviewed and examined the patient, and I agree with the physician assistant's findings.  Nothing more to add at this point.  Continued wound care on R leg as written for.   Adele Barthel, MD, FACS [facility] Office: [phone] Pager: [phone]  11/13/2016, 11:31 AM

## 2016-11-14 DIAGNOSIS — L03115 Cellulitis of right lower limb: Secondary | ICD-10-CM

## 2016-11-14 DIAGNOSIS — K5900 Constipation, unspecified: Secondary | ICD-10-CM

## 2016-11-14 LAB — CULTURE, BODY FLUID W GRAM STAIN -BOTTLE: Culture: NO GROWTH

## 2016-11-14 LAB — GLUCOSE, CAPILLARY
GLUCOSE-CAPILLARY: 90 mg/dL (ref 65–99)
GLUCOSE-CAPILLARY: 75 mg/dL (ref 65–99)
Glucose-Capillary: 79 mg/dL (ref 65–99)
Glucose-Capillary: 74 mg/dL (ref 65–99)
Glucose-Capillary: 64 mg/dL — ABNORMAL LOW (ref 65–99)

## 2016-11-14 LAB — BASIC METABOLIC PANEL
Anion gap: 10 (ref 5–15)
BUN: 30 mg/dL — ABNORMAL HIGH (ref 6–20)
CALCIUM: 7.7 mg/dL — AB (ref 8.9–10.3)
CHLORIDE: 97 mmol/L — AB (ref 101–111)
CO2: 27 mmol/L (ref 22–32)
CREATININE: 6.37 mg/dL — AB (ref 0.61–1.24)
GFR calc Af Amer: 11 mL/min — ABNORMAL LOW (ref >60)
GFR, EST NON AFRICAN AMERICAN: 10 mL/min — AB (ref >60)
Glucose, Bld: 83 mg/dL (ref 65–99)
Potassium: 3.6 mmol/L (ref 3.5–5.1)
SODIUM: 134 mmol/L — AB (ref 135–145)

## 2016-11-14 LAB — CBC
HCT: 26.5 % — ABNORMAL LOW (ref 39.0–52.0)
Hemoglobin: 8.2 g/dL — ABNORMAL LOW (ref 13.0–17.0)
MCH: 25.3 pg — AB (ref 26.0–34.0)
MCHC: 30.9 g/dL (ref 30.0–36.0)
MCV: 81.8 fL (ref 78.0–100.0)
PLATELETS: 505 10*3/uL — AB (ref 150–400)
RBC: 3.24 MIL/uL — ABNORMAL LOW (ref 4.22–5.81)
RDW: 21.3 % — AB (ref 11.5–15.5)
WBC: 15.1 10*3/uL — ABNORMAL HIGH (ref 4.0–10.5)

## 2016-11-14 MED ORDER — LABETALOL HCL 300 MG PO TABS
300.0000 mg | ORAL_TABLET | Freq: Two times a day (BID) | ORAL | Status: DC
  Administered 2016-11-14 – 2016-11-20 (×12): 300 mg via ORAL
  Filled 2016-11-14 (×12): qty 1

## 2016-11-14 MED ORDER — ESCITALOPRAM OXALATE 10 MG PO TABS
10.0000 mg | ORAL_TABLET | Freq: Every day | ORAL | Status: DC
  Administered 2016-11-14 – 2016-11-20 (×7): 10 mg via ORAL
  Filled 2016-11-14 (×7): qty 1

## 2016-11-14 MED ORDER — AMLODIPINE BESYLATE 10 MG PO TABS
10.0000 mg | ORAL_TABLET | Freq: Every day | ORAL | Status: DC
  Administered 2016-11-15 – 2016-11-20 (×6): 10 mg via ORAL
  Filled 2016-11-14 (×6): qty 1

## 2016-11-14 MED ORDER — HYDROCODONE-ACETAMINOPHEN 5-325 MG PO TABS
ORAL_TABLET | ORAL | Status: AC
  Administered 2016-11-14: 2 via ORAL
  Filled 2016-11-14: qty 2

## 2016-11-14 MED ORDER — LIDOCAINE-PRILOCAINE 2.5-2.5 % EX CREA: 1.0000 "application " | TOPICAL_CREAM | CUTANEOUS | Status: DC | PRN

## 2016-11-14 MED ORDER — SODIUM CHLORIDE 0.9 % IV SOLN: 100.0000 mL | INTRAVENOUS | Status: DC | PRN

## 2016-11-14 MED ORDER — HEPARIN SODIUM (PORCINE) 1000 UNIT/ML DIALYSIS: 1000.0000 [IU] | INTRAMUSCULAR | Status: DC | PRN

## 2016-11-14 MED ORDER — POTASSIUM CHLORIDE CRYS ER 20 MEQ PO TBCR
30.0000 meq | EXTENDED_RELEASE_TABLET | Freq: Two times a day (BID) | ORAL | Status: AC
  Administered 2016-11-14 (×2): 30 meq via ORAL
  Filled 2016-11-14 (×2): qty 1

## 2016-11-14 MED ORDER — HEPARIN SODIUM (PORCINE) 1000 UNIT/ML DIALYSIS
3500.0000 [IU] | INTRAMUSCULAR | Status: DC | PRN
  Administered 2016-11-14: 3500 [IU] via INTRAVENOUS_CENTRAL
  Filled 2016-11-14: qty 4

## 2016-11-14 MED ORDER — PENTAFLUOROPROP-TETRAFLUOROETH EX AERO: 1.0000 "application " | INHALATION_SPRAY | CUTANEOUS | Status: DC | PRN

## 2016-11-14 MED ORDER — ALTEPLASE 2 MG IJ SOLR: 2.0000 mg | Freq: Once | INTRAMUSCULAR | Status: DC | PRN

## 2016-11-14 MED ORDER — LIDOCAINE HCL (PF) 1 % IJ SOLN: 5.0000 mL | INTRAMUSCULAR | Status: DC | PRN

## 2016-11-14 MED ORDER — LACTULOSE 10 GM/15ML PO SOLN
20.0000 g | Freq: Three times a day (TID) | ORAL | Status: AC
  Administered 2016-11-14: 20 g via ORAL
  Filled 2016-11-14 (×2): qty 30

## 2016-11-14 MED ORDER — MAGNESIUM SULFATE 2 GM/50ML IV SOLN
2.0000 g | Freq: Once | INTRAVENOUS | Status: AC
  Administered 2016-11-14: 2 g via INTRAVENOUS
  Filled 2016-11-14: qty 50

## 2016-11-14 MED ORDER — BUPROPION HCL ER (XL) 150 MG PO TB24
150.0000 mg | ORAL_TABLET | Freq: Every morning | ORAL | Status: AC
  Administered 2016-11-15 – 2016-11-17 (×3): 150 mg via ORAL
  Filled 2016-11-14 (×4): qty 1

## 2016-11-14 NOTE — Care Management Note (Signed)
Case Management Note Original note created by; Marvetta Gibbons RN, BSN Unit 2W-Case Manager [phone]  Patient Details  Name: Hayden [name] MRN: [id number] Date of Birth: 1970/08/14  Subjective/Objective:   Pt admitted with necrotic toe, hx ESRD on PD at home. s/p Rightcommon femoral artery to below-the-knee popliteal artery bypass with non-reversed ipsilateral greater saphenous vein  2. Right first ray amputation 3. Right dorsal ulcer debridement -- on 11/05/16--- post op remains on Vent as of 5/9                 Action/Plan: PTA pt lived at home with wife- CM will follow for d/c needs- PT/OT evals pending-   Expected Discharge Date:                  Expected Discharge Plan:  [place]  In-House Referral:     Discharge planning Services  CM Consult  Post Acute Care Choice:    Choice offered to:     DME Arranged:    DME Agency:     HH Arranged:    [place] Agency:     Status of Service:  In process, will continue to follow  If discussed at Long Length of Stay Meetings, dates discussed:    Discharge Disposition:   Additional Comments: 11/14/2016  Discussed in LOS 5/17 - pt remains appropriate for continued stay.  Per physician advisor pt is appropriate for [facility] - both agencies accepted referral during meeting.  CM text paged attending to request LTACH referral order  11/12/16 Pt POD #7 s/p R CFA to BK pop w/ ips NR GSV, R 1st ray amp Plan is to d/c staples in another 3-7 days - additional intervention may be warranted.  CIR consulted - CSW consulted as back up plan Maryclare Labrador, RN 11/14/2016, 9:02 AM

## 2016-11-14 NOTE — Addendum Note (Signed)
Addendum  created 11/14/16 0926 by Josephine Igo, CRNA   Anesthesia Staff edited

## 2016-11-14 NOTE — Progress Notes (Signed)
Dialysis treatment completed.  1500 mL ultrafiltrated.  1000 mL net fluid removal.  Patient status unchanged. Lung sounds diminished to ausculation in all fields. RLE 2+ edema. Cardiac: NSR, HB.  Cleansed L femoral catheter with chlorhexidine.  Disconnected lines and flushed ports with saline per protocol.  Ports locked with heparin and capped per protocol.    Report given to bedside, RN Ginger.

## 2016-11-14 NOTE — Progress Notes (Signed)
Inpatient Rehabilitation  Attempted to meet with patient to discuss team's recommendation for IP Rehab.  Patient currently off floor in HD.  Plan to follow up with patient again as time allows.  Carmelia Roller., CCC/SLP Admission Coordinator  [place]  Cell [phone]

## 2016-11-14 NOTE — Consult Note (Signed)
[place] Nurse wound consult note Reason for Consult: application of Profore to Right leg Wound type: na Pressure Injury POA: na Measurement:na Wound bed:na Drainage (amount, consistency, odor) na Periwound:na Dressing procedure/placement/frequency: Profore applied to right leg. One site on the surgical inciscion just below the knee still has some bleeding.  Dry gauze replaced so would start fresh and monitor bleeding amount. Speed of capillary refill did not change after application.  Pt's wife present, instructed her as well as bedside RN to call wound care team for any problems with wrap such as suspected decreased circulation. We will follow this patient and return Monday, 5/21 to remove and reapply new Profore wrap. Instructed to keep leg elevated but pt is extremely active moving around in the bed and is incoherent and cannot follow instructions.    Fara Olden, RN-C, WTA-C Wound Treatment Associate

## 2016-11-14 NOTE — Care Management Note (Signed)
Case Management Note Original note created by; Marvetta Gibbons RN, BSN Unit 2W-Case Manager [phone]  Patient Details  Name: Hayden [name] MRN: [id number] Date of Birth: Oct 31, 1970  Subjective/Objective:   Pt admitted with necrotic toe, hx ESRD on PD at home. s/p Rightcommon femoral artery to below-the-knee popliteal artery bypass with non-reversed ipsilateral greater saphenous vein  2. Right first ray amputation 3. Right dorsal ulcer debridement -- on 11/05/16--- post op remains on Vent as of 5/9                 Action/Plan: PTA pt lived at home with wife- CM will follow for d/c needs- PT/OT evals pending-   Expected Discharge Date:                  Expected Discharge Plan:  [place]  In-House Referral:     Discharge planning Services  CM Consult  Post Acute Care Choice:    Choice offered to:     DME Arranged:    DME Agency:     HH Arranged:    [place] Agency:     Status of Service:  In process, will continue to follow  If discussed at Long Length of Stay Meetings, dates discussed:    Discharge Disposition:   Additional Comments: 11/14/2016  CM spoke with attending - attending will assess pt/review chart and follow back up with CM regarding appropriateness of LTACH referral - at this point attending in not in agreement  Discussed in LOS 5/17 - pt remains appropriate for continued stay.  Per physician advisor pt is appropriate for [facility] - both agencies accepted referral during meeting.  CM text paged attending to request LTACH referral order  11/12/16 Pt POD #7 s/p R CFA to BK pop w/ ips NR GSV, R 1st ray amp Plan is to d/c staples in another 3-7 days - additional intervention may be warranted.  CIR consulted - CSW consulted as back up plan Maryclare Labrador, RN 11/14/2016, 10:15 AM

## 2016-11-14 NOTE — Plan of Care (Signed)
Problem: Education: Goal: Knowledge of [facility] Education information/materials will improve Outcome: Not Met (add Reason) Pt unable to comprehend education. Wife able to understand.  Problem: Skin Integrity: Goal: Risk for impaired skin integrity will decrease Outcome: Not Progressing Pt does not abide turning every 2 hours or floating heels.   Problem: Education: Goal: Knowledge of [place] [facility] Education information/materials will improve Outcome: Not Met (add Reason) Pt unable to comprehend education d/t diagnosis  Problem: Skin Integrity: Goal: Risk for impaired skin integrity will decrease Outcome: Not Progressing Pt does not abide by turning every 2 hours or floating heels.

## 2016-11-14 NOTE — Progress Notes (Signed)
[facility] Progress Note   Subjective: still confused and agitated. Calmer than yesterday.   Vitals:   11/14/16 1200 11/14/16 1230 11/14/16 1300 11/14/16 1330  BP: (!) 214/105 (!) 207/92 (!) 210/100 (!) 200/100  Pulse: 78 79 78 79  Resp:      Temp:      TempSrc:      SpO2:      Weight:      Height:        Inpatient medications: . amLODipine  5 mg Oral Daily  . aspirin  81 mg Oral Daily  . budesonide (PULMICORT) nebulizer solution  0.5 mg Nebulization BID  . [START ON 11/15/2016] buPROPion  150 mg Oral q morning - 10a  . calcitRIOL  0.5 mcg Oral Daily  . cinacalcet  60 mg Oral Q supper  . cloNIDine  0.2 mg Transdermal Weekly  . darbepoetin (ARANESP) injection - NON-DIALYSIS  200 mcg Subcutaneous Q Sun-1800  . docusate sodium  100 mg Oral Daily  . escitalopram  10 mg Oral Daily  . feeding supplement (NEPRO CARB STEADY)  237 mL Oral BID BM  . feeding supplement (PRO-STAT SUGAR FREE 64)  30 mL Oral BID  . ferric citrate  420 mg Oral TID WC  . heparin  5,000 Units Subcutaneous Q8H  . insulin aspart  0-9 Units Subcutaneous Q4H  . isosorbide dinitrate  5 mg Oral BID  . labetalol  200 mg Oral BID  . lactulose  20 g Oral TID  . LORazepam  1 mg Intravenous Q6H  . mouth rinse  15 mL Mouth Rinse BID  . pantoprazole  40 mg Oral Daily  . potassium chloride  30 mEq Oral BID  . pravastatin  20 mg Oral Daily  . saccharomyces boulardii  250 mg Oral BID  . sodium chloride flush  3 mL Intravenous Q12H   . sodium chloride Stopped (11/05/16 1412)  . sodium chloride    . sodium chloride    . sodium chloride    . sodium chloride    . magnesium sulfate 1 - 4 g bolus IVPB    . valproate sodium Stopped (11/14/16 1027)   sodium chloride, sodium chloride, sodium chloride, sodium chloride, sodium chloride, ALPRAZolam, alteplase, fentaNYL (SUBLIMAZE) injection, Gerhardt's butt cream, heparin, [START ON 11/15/2016] heparin, hydrALAZINE, HYDROcodone-acetaminophen, lidocaine (PF),  lidocaine-prilocaine, pentafluoroprop-tetrafluoroeth, prochlorperazine  Exam: Licensed conveyancer of ideas, rambling on continuous No jvd Chest clear bilat RRR no murmur ABd obese, soft ntnd, PD cath in place Ext RLE edema post-op, R 1st toe amp and wounds from fem-po healing well. No LLE edema L forearm AVF +bruit  Dialysis: [facility].  CCPD: 3L fill 5 exchanges at night with 5th fill manually drained 1-1/2 hours later. Uses mostly 1's and 2's at home.       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  Not sure cause, possibly manipulation of psych meds (for Sara Chu) may have thrown him into this manic episode.  Also getting trial of HD yest and today, see if it helps any.   3  ESRD holding PD while on HD 4  Vol looks dry 5  Abnormal myoview - per cards prob inaccurate, no further invest 6  Anemia cont darbe , ^'d 200/wk  Plan - HD again today.    Kelly Splinter MD [employer] pager [phone]   11/14/2016,  1:51 PM    Recent Labs Lab 11/09/16 0525 11/10/16 0239  11/12/16 1530 11/13/16 0333 11/14/16 0230  NA 135 134*  < > 133* 136 134*  K 2.9* 3.0*  < > 3.4* 3.5 3.6  CL 96* 95*  < > 97* 96* 97*  CO2 25 25  < > 23 27 27   GLUCOSE 100* 106*  < > 75 90 83  BUN 51* 51*  < > 58* 53* 30*  CREATININE 10.76* 11.02*  < > 10.27* 9.68* 6.37*  CALCIUM 7.3* 7.3*  < > 7.3* 7.6* 7.7*  PHOS 7.0* 6.3*  --  5.4*  --   --   < > = values in this interval not displayed.  Recent Labs Lab 11/08/16 0238 11/09/16 0525 11/12/16 1530  ALBUMIN 1.6* 1.5* 1.5*    Recent Labs Lab 11/12/16 1530 11/13/16 0333 11/14/16 0230  WBC 13.4* 13.0* 15.1*  HGB 8.0* 8.5* 8.2*  HCT 24.9* 27.2* 26.5*  MCV 80.1 81.0 81.8  PLT 495* 524* 505*   Iron/TIBC/Ferritin/ %Sat    Component Value Date/Time   IRON 7 (L) 11/15/2015 1342   TIBC 164 (L) 11/15/2015 1342   FERRITIN 1,444 (H) 11/15/2015 1342    IRONPCTSAT 4 (L) 11/15/2015 1342

## 2016-11-14 NOTE — Progress Notes (Signed)
PT Cancellation Note  Patient Details Name: Hayden [name] MRN: [id number] DOB: 1971/05/24   Cancelled Treatment:    Reason Eval/Treat Not Completed: Medical issues which prohibited therapy Pt s/p temp fem HD cath placed yesterday. Will follow up as appropriate when HD cath becomes permanent or is moved elsewhere.   [place] 11/14/2016, 9:33 AM  Wray Kearns, PT, DPT [phone]

## 2016-11-14 NOTE — Progress Notes (Signed)
OT Cancellation Note  Patient Details Name: DORMAN CALDERWOOD MRN: [id number] DOB: 09-16-1970   Cancelled Treatment:    Reason Eval/Treat Not Completed: Medical issues which prohibited therapy.  Pt has temp femoral HD cath which impedes pt's ability to sit EOB or mobilize.  Will see once cath removed, or location changed.  [place], OTR/L [phone]   Lucille Passy M 11/14/2016, 10:25 AM

## 2016-11-14 NOTE — Progress Notes (Signed)
Patient HD treatment modified from 3.5 to 3 hrs, per nephrology at bedside.  Will continue to monitor.

## 2016-11-14 NOTE — Progress Notes (Signed)
Patient arrived to unit by bed.  Reviewed treatment plan and this RN agrees with plan.  Report received from bedside RN, Ginger.  Consent verified.  Patient A, orient to self only.   Lung sounds clear to ausculation in all fields. RLE 2+ edema. Cardiac:  NSR, HB.  Removed caps and cleansed L femoral catheter with chlorhedxidine.  Aspirated ports of heparin and flushed them with saline per protocol.  Connected and secured lines, initiated treatment at 1114.  UF Goal of 500 mL and net fluid removal 0 L.  Will continue to monitor.

## 2016-11-14 NOTE — Progress Notes (Signed)
[facility]  PROGRESS NOTE  Hayden [name] WRU:[id number] DOB: [birth year] DOA: 10/23/2016 PCP: Corliss Parish, MD   Brief HPI:   46 y/o male with ESRD on PD, admitted on 4/25 with cellulitis and gangrene of right great toe. He was also noted to be encephalopathic. He was seen by vascular surgery and underwent right great toe amputation and right common femoral artery to below the knee popliteal artery bypass. He is continued on vancomycin. He is undergoing PD per nephrology. Hospital course has been complicated by encephalopathy and worsening delirium, needing precedex infusion. Since being started on precedex, his mental status has started to improve. He has been weaned off precedex and has been started on xanax. Will need physical therapy and possible placement. PT evaluation recommended CIR, inpatient rehabilitation consulted. [facility] assumed care 5/13    Subjective   Patient seen and examined, continues to be confused. Pressured speech.   Assessment/Plan:     1. Cellulitis, gangrene of right big toe-status post intubation of right big toe 1 11/05/2016. Patient currently on IV vancomycin. Culture showed no growth. Vascular surgery following. Patient is currently on vancomycin. 2. Acute encephalopathy/delirium- patient's hospital course has been complicated with delirium he has been seen by neurology and psychiatry during this admission. It was felt that patients encephalopathy could be from withdrawal of medication/alcohol. Antipsychotics were not recommended due to prolonged QTc and he was started on Depakote and lorazepam. After surgery patient's delirium worsened and he was transferred to ICU was placed on Precedex infusion. Currently he is off Precedex and on Xanax, Wellbutrin and Depakote. Ammonia level is 30. We will try to wean off the Wellbutrin and restart Lexapro which patient was taking at home. 3. Peripheral vascular disease- status post right common femoral artery to  below the knee popliteal artery bypass. Vascular surgery following. No new recommendations. 4. Hypertensive urgency-blood pressure is now elevated. Continue labetalol, isosorbide, clonidine patch, amlodipine, when necessary hydralazine. 5. ESRD-patient started on hemodialysis per nephrology. 6. Constipation- start lactulose 20 g 3 times a day for 3 doses. We'll see if this improves his mental status. Though ammonia level is 30. 7. Anemia of chronic disease- hemoglobin and hematocrit currently stable 8. Hyperlipidemia- continue Pravachol 9. Diabetes mellitus type 2- blood glucose is stable, continue sliding scale insulin with NovoLog. Last 3 blood glucose as follows 90, 79, 75.      DVT prophylaxis: Heparin  Code Status: Full code  Family Communication: Discussed with patient's wife at bedside   Disposition Plan: Pending improvement in mental status   Consultants:  Nephrology  Vascular surgery  [facility] CM  Psychiatry  Cardiology  CIR  Procedures: 4/25 Admit  5/07 Seen by Neurology &Psychiatry  5/08 To OR for right femoropopliteal vs femorotibial bypass &right great toe amputation  5/09 Extubated 5/11 Off precedex  5/13 TRH assumed care   Continuous infusions . sodium chloride Stopped (11/05/16 1412)  . sodium chloride    . sodium chloride    . sodium chloride    . sodium chloride    . magnesium sulfate 1 - 4 g bolus IVPB    . valproate sodium Stopped (11/14/16 1027)      Antibiotics:   Anti-infectives    Start     Dose/Rate Route Frequency Ordered Stop   11/13/16 1730  vancomycin (VANCOCIN) IVPB 1000 mg/200 mL premix     1,000 mg 200 mL/hr over 60 Minutes Intravenous  Once 11/13/16 1623 11/13/16 1830   11/12/16 1900  vancomycin (VANCOCIN) IVPB 1000  mg/200 mL premix  Status:  Discontinued     1,000 mg 200 mL/hr over 60 Minutes Intravenous To Hemodialysis 11/12/16 1648 11/13/16 1623   11/09/16 1000  vancomycin (VANCOCIN) 500 mg in sodium chloride 0.9  % 100 mL IVPB  Status:  Discontinued     500 mg 100 mL/hr over 60 Minutes Intravenous Once every 5 days 11/04/16 1326 11/12/16 1648   11/05/16 0800  piperacillin-tazobactam (ZOSYN) IVPB 2.25 g  Status:  Discontinued     2.25 g 100 mL/hr over 30 Minutes Intravenous To Surgery 11/05/16 0747 11/05/16 1413   11/04/16 2200  piperacillin-tazobactam (ZOSYN) IVPB 2.25 g  Status:  Discontinued     2.25 g 100 mL/hr over 30 Minutes Intravenous Every 12 hours 11/04/16 1507 11/07/16 1011   11/04/16 1400  vancomycin (VANCOCIN) 500 mg in sodium chloride 0.9 % 100 mL IVPB     500 mg 100 mL/hr over 60 Minutes Intravenous  Once 11/04/16 1317 11/04/16 1615   10/31/16 2000  piperacillin-tazobactam (ZOSYN) IVPB 3.375 g  Status:  Discontinued     3.375 g 12.5 mL/hr over 240 Minutes Intravenous Every 12 hours 10/31/16 0842 11/04/16 1507   10/30/16 1400  vancomycin (VANCOCIN) 500 mg in sodium chloride 0.9 % 100 mL IVPB  Status:  Discontinued     500 mg 100 mL/hr over 60 Minutes Intravenous Once every 6 days 10/30/16 1306 11/04/16 1317   10/30/16 1000  vancomycin (VANCOCIN) 500 mg in sodium chloride 0.9 % 100 mL IVPB  Status:  Discontinued     500 mg 100 mL/hr over 60 Minutes Intravenous Once every 6 days 10/29/16 1114 10/30/16 1306   10/24/16 0600  piperacillin-tazobactam (ZOSYN) IVPB 3.375 g  Status:  Discontinued     3.375 g 12.5 mL/hr over 240 Minutes Intravenous Every 12 hours 10/23/16 1854 10/31/16 0842   10/23/16 1845  piperacillin-tazobactam (ZOSYN) IVPB 3.375 g     3.375 g 100 mL/hr over 30 Minutes Intravenous  Once 10/23/16 1837 10/23/16 1928   10/23/16 1845  vancomycin (VANCOCIN) 2,000 mg in sodium chloride 0.9 % 500 mL IVPB     2,000 mg 250 mL/hr over 120 Minutes Intravenous  Once 10/23/16 1842 10/23/16 2131       Objective   Vitals:   11/14/16 0820 11/14/16 1104 11/14/16 1114 11/14/16 1130  BP: (!) 176/88 (!) 186/93 (!) 188/98 (!) 214/94  Pulse: 76 75 75 75  Resp: 14 16    Temp: 98.7 F  (37.1 C) 98.6 F (37 C)    TempSrc: Oral     SpO2: 98%     Weight:  85.7 kg (188 lb 15 oz)    Height:        Intake/Output Summary (Last 24 hours) at 11/14/16 1134 Last data filed at 11/14/16 1000  Gross per 24 hour  Intake              230 ml  Output                0 ml  Net              230 ml   Filed Weights   11/13/16 1200 11/13/16 1624 11/14/16 1104  Weight: 86.3 kg (190 lb 4.1 oz) 86.3 kg (190 lb 4.1 oz) 85.7 kg (188 lb 15 oz)     Physical Examination:    Physical Exam: Eyes: No icterus, extraocular muscles intact  Mouth: Oral mucosa is moist, no lesions on palate,  Neck: Supple, no  deformities, masses, or tenderness Lungs: Normal respiratory effort, bilateral clear to auscultation, no crackles or wheezes.  Heart: Regular rate and rhythm, S1 and S2 normal, no murmurs, rubs auscultated Abdomen: BS normoactive,soft,nondistended,non-tender to palpation,no organomegaly Extremities: No pretibial edema Neuro : Alert, not oriented to place, time. Skin: s/p amputation of right big toe, mild erythema on dorsum of foot     Data Reviewed: I have personally reviewed following labs and imaging studies  CBG:  Recent Labs Lab 11/13/16 0350 11/13/16 0850 11/14/16 0130 11/14/16 0349 11/14/16 0824  GLUCAP 89 71 90 79 75    CBC:  Recent Labs Lab 11/10/16 0239 11/11/16 0232 11/12/16 1530 11/13/16 0333 11/14/16 0230  WBC 14.3* 13.3* 13.4* 13.0* 15.1*  HGB 8.2* 8.4* 8.0* 8.5* 8.2*  HCT 25.6* 26.1* 24.9* 27.2* 26.5*  MCV 80.0 79.6 80.1 81.0 81.8  PLT 428* 441* 495* 524* 505*    Basic Metabolic Panel:  Recent Labs Lab 11/08/16 0238 11/09/16 0525 11/10/16 0239 11/11/16 0232 11/12/16 1530 11/13/16 0333 11/14/16 0230  NA 134* 135 134* 134* 133* 136 134*  K 2.9* 2.9* 3.0* 3.3* 3.4* 3.5 3.6  CL 97* 96* 95* 96* 97* 96* 97*  CO2 23 25 25 24 23 27 27   GLUCOSE 135* 100* 106* 105* 75 90 83  BUN 51* 51* 51* 52* 58* 53* 30*  CREATININE 10.99* 10.76* 11.02*  10.58* 10.27* 9.68* 6.37*  CALCIUM 7.0* 7.3* 7.3* 7.3* 7.3* 7.6* 7.7*  MG  --   --  1.9  --   --   --   --   PHOS 7.0* 7.0* 6.3*  --  5.4*  --   --     Recent Results (from the past 240 hour(s))  Culture, body fluid-bottle     Status: None   Collection Time: 11/09/16  8:36 AM  Result Value Ref Range Status   Specimen Description FLUID PERITONEAL DIALYSIS  Final   Special Requests NONE  Final   Culture NO GROWTH 5 DAYS  Final   Report Status 11/14/2016 FINAL  Final  Gram stain     Status: None   Collection Time: 11/09/16  8:36 AM  Result Value Ref Range Status   Specimen Description FLUID PERITONEAL DIALYSIS  Final   Special Requests NONE  Final   Gram Stain NO WBC SEEN NO ORGANISMS SEEN   Final   Report Status 11/09/2016 FINAL  Final     Liver Function Tests:  Recent Labs Lab 11/08/16 0238 11/09/16 0525 11/12/16 1530  ALBUMIN 1.6* 1.5* 1.5*   No results for input(s): LIPASE, AMYLASE in the last 168 hours.  Recent Labs Lab 11/13/16 0920  AMMONIA 30    No   Studies: No results found.  Scheduled Meds: . amLODipine  5 mg Oral Daily  . aspirin  81 mg Oral Daily  . budesonide (PULMICORT) nebulizer solution  0.5 mg Nebulization BID  . [START ON 11/15/2016] buPROPion  150 mg Oral q morning - 10a  . calcitRIOL  0.5 mcg Oral Daily  . cinacalcet  60 mg Oral Q supper  . cloNIDine  0.2 mg Transdermal Weekly  . darbepoetin (ARANESP) injection - NON-DIALYSIS  200 mcg Subcutaneous Q Sun-1800  . docusate sodium  100 mg Oral Daily  . escitalopram  10 mg Oral Daily  . feeding supplement (NEPRO CARB STEADY)  237 mL Oral BID BM  . feeding supplement (PRO-STAT SUGAR FREE 64)  30 mL Oral BID  . ferric citrate  420 mg Oral TID WC  .  heparin  5,000 Units Subcutaneous Q8H  . HYDROcodone-acetaminophen      . insulin aspart  0-9 Units Subcutaneous Q4H  . isosorbide dinitrate  5 mg Oral BID  . labetalol  200 mg Oral BID  . lactulose  20 g Oral TID  . LORazepam  1 mg Intravenous  Q6H  . mouth rinse  15 mL Mouth Rinse BID  . pantoprazole  40 mg Oral Daily  . potassium chloride  30 mEq Oral BID  . pravastatin  20 mg Oral Daily  . saccharomyces boulardii  250 mg Oral BID  . sodium chloride flush  3 mL Intravenous Q12H      Time spent: 25 min  [facility] Pager [phone]. If 7PM-7AM, please contact night-coverage at [email or web address], Office  [phone]  password [email or web address] 11/14/2016, 11:34 AM  LOS: 21 days

## 2016-11-14 NOTE — Progress Notes (Signed)
   Daily Progress Note  Pt off the floor. Not much to add at this point from a vascular viewpoint.  See Dr. Jess Barters note in regard to getting some compression on R leg.  - Will check on patient tomorrow   Adele Barthel, MD, [place] [facility] Office: [phone] Pager: [phone]  11/14/2016, 2:21 PM

## 2016-11-14 NOTE — Addendum Note (Signed)
Addendum  created 11/14/16 5885 by Josephine Igo, CRNA   Anesthesia Staff edited

## 2016-11-15 LAB — GLUCOSE, CAPILLARY
GLUCOSE-CAPILLARY: 57 mg/dL — AB (ref 65–99)
GLUCOSE-CAPILLARY: 85 mg/dL (ref 65–99)
GLUCOSE-CAPILLARY: 98 mg/dL (ref 65–99)
GLUCOSE-CAPILLARY: 98 mg/dL (ref 65–99)
Glucose-Capillary: 62 mg/dL — ABNORMAL LOW (ref 65–99)
Glucose-Capillary: 203 mg/dL — ABNORMAL HIGH (ref 65–99)
Glucose-Capillary: 60 mg/dL — ABNORMAL LOW (ref 65–99)
Glucose-Capillary: 48 mg/dL — ABNORMAL LOW (ref 65–99)
Glucose-Capillary: 111 mg/dL — ABNORMAL HIGH (ref 65–99)

## 2016-11-15 LAB — CBC
HCT: 25.7 % — ABNORMAL LOW (ref 39.0–52.0)
HEMOGLOBIN: 8.1 g/dL — AB (ref 13.0–17.0)
MCH: 26.3 pg (ref 26.0–34.0)
MCHC: 31.5 g/dL (ref 30.0–36.0)
MCV: 83.4 fL (ref 78.0–100.0)
PLATELETS: 494 10*3/uL — AB (ref 150–400)
RBC: 3.08 MIL/uL — AB (ref 4.22–5.81)
RDW: 21.8 % — ABNORMAL HIGH (ref 11.5–15.5)
WBC: 12 10*3/uL — AB (ref 4.0–10.5)

## 2016-11-15 LAB — BASIC METABOLIC PANEL
ANION GAP: 10 (ref 5–15)
BUN: 25 mg/dL — AB (ref 6–20)
CHLORIDE: 98 mmol/L — AB (ref 101–111)
CO2: 27 mmol/L (ref 22–32)
Calcium: 7.8 mg/dL — ABNORMAL LOW (ref 8.9–10.3)
Creatinine, Ser: 5.12 mg/dL — ABNORMAL HIGH (ref 0.61–1.24)
GFR calc non Af Amer: 12 mL/min — ABNORMAL LOW (ref >60)
GFR, EST AFRICAN AMERICAN: 14 mL/min — AB (ref >60)
Glucose, Bld: 64 mg/dL — ABNORMAL LOW (ref 65–99)
POTASSIUM: 3.8 mmol/L (ref 3.5–5.1)
SODIUM: 135 mmol/L (ref 135–145)

## 2016-11-15 LAB — HEPATITIS B CORE ANTIBODY, TOTAL: HEP B C TOTAL AB: NEGATIVE

## 2016-11-15 LAB — HEPATITIS B SURFACE ANTIBODY,QUALITATIVE: Hep B S Ab: REACTIVE

## 2016-11-15 LAB — HEPATITIS B SURFACE ANTIGEN: Hepatitis B Surface Ag: NEGATIVE

## 2016-11-15 MED ORDER — HEPARIN 1000 UNIT/ML FOR PERITONEAL DIALYSIS: 500.0000 [IU] | INTRAMUSCULAR | Status: DC | PRN

## 2016-11-15 MED ORDER — DEXTROSE 50 % IV SOLN
25.0000 mL | INTRAVENOUS | Status: AC
  Administered 2016-11-15: 25 mL via INTRAVENOUS
  Filled 2016-11-15: qty 50

## 2016-11-15 MED ORDER — DEXTROSE 50 % IV SOLN
25.0000 mL | INTRAVENOUS | Status: AC
  Administered 2016-11-15: 25 mL via INTRAVENOUS

## 2016-11-15 MED ORDER — DELFLEX-LC/1.5% DEXTROSE 344 MOSM/L IP SOLN: INTRAPERITONEAL | Status: DC

## 2016-11-15 MED ORDER — DEXTROSE 50 % IV SOLN
25.0000 mL | Freq: Once | INTRAVENOUS | Status: AC
  Administered 2016-11-15: 25 mL via INTRAVENOUS

## 2016-11-15 MED ORDER — HEPARIN 1000 UNIT/ML FOR PERITONEAL DIALYSIS
INTRAPERITONEAL | Status: DC | PRN
  Filled 2016-11-15: qty 5000

## 2016-11-15 MED ORDER — GENTAMICIN SULFATE 0.1 % EX CREA
1.0000 "application " | TOPICAL_CREAM | Freq: Every day | CUTANEOUS | Status: DC
  Administered 2016-11-16 – 2016-11-17 (×2): 1 via TOPICAL
  Filled 2016-11-15: qty 15

## 2016-11-15 MED ORDER — DEXTROSE 50 % IV SOLN
INTRAVENOUS | Status: AC
  Filled 2016-11-15: qty 50

## 2016-11-15 NOTE — Progress Notes (Signed)
   Daily Progress Note   Assessment/Planning:   POD #10 s/p R CFA to BK pop BPG w/ ips GSV   R foot looks better  Sutures still in place in foot: keep in place another 4 days  Will take down all dressings and take out staples tomorrow  Coban applied to R foot to help with venous hypertension  Abx discontinued   Subjective  - 10 Days Post-Op   Confused and disoriented   Objective   Vitals:   11/15/16 0048 11/15/16 0300 11/15/16 0542 11/15/16 0752  BP: (!) 195/93 (!) 182/93 135/75 (!) 151/69  Pulse: 74 67  70  Resp: 20 20 18 17   Temp: 97.9 F (36.6 C) 98.4 F (36.9 C)  98.5 F (36.9 C)  TempSrc: Oral Axillary  Oral  SpO2:      Weight:      Height:         Intake/Output Summary (Last 24 hours) at 11/15/16 0802 Last data filed at 11/14/16 2300  Gross per 24 hour  Intake              260 ml  Output             1000 ml  Net             -740 ml    VASC Warm R foot, bandage from calf to foot obscures wound exam  NEURO Not oriented, incoherent    Laboratory CBC CBC Latest Ref Rng & Units 11/15/2016 11/14/2016 11/13/2016  WBC 4.0 - 10.5 K/uL 12.0(H) 15.1(H) 13.0(H)  Hemoglobin 13.0 - 17.0 g/dL 8.1(L) 8.2(L) 8.5(L)  Hematocrit 39.0 - 52.0 % 25.7(L) 26.5(L) 27.2(L)  Platelets 150 - 400 K/uL 494(H) 505(H) 524(H)     BMET    Component Value Date/Time   NA 135 11/15/2016 0320   K 3.8 11/15/2016 0320   CL 98 (L) 11/15/2016 0320   CO2 27 11/15/2016 0320   GLUCOSE 64 (L) 11/15/2016 0320   BUN 25 (H) 11/15/2016 0320   CREATININE 5.12 (H) 11/15/2016 0320   CALCIUM 7.8 (L) 11/15/2016 0320   GFRNONAA 12 (L) 11/15/2016 0320   GFRAA 14 (L) 11/15/2016 0320      Adele Barthel, MD, FACS [facility] [place] Office: [phone] Pager: [phone]  11/15/2016, 8:02 AM

## 2016-11-15 NOTE — Progress Notes (Signed)
Hypoglycemic Event  CBG: 62  Treatment: 25 mL D50 (patient refused juice)  Symptoms: None  Follow-up CBG: Time:0534 CBG Result:98  Possible Reasons for Event: Inadequate Po intake  Comments/MD notified: yes    Lamyra Malcolm N

## 2016-11-15 NOTE — Progress Notes (Signed)
Hypoglycemic Event  CBG: 57 at 0039  Treatment: 25 mL D50  Symptoms: None  Follow-up CBG: Time:0115 CBG Result:98  Possible Reasons for Event: Inadequate PO intake  Comments/MD notified: Yes    Hayden [name]

## 2016-11-15 NOTE — Progress Notes (Signed)
Inpatient Rehabilitation  Attempted to meet with patient; however, he was asleep and wife is currently at work.  I left booklets in his room as well as a voicemail for Ms. Doyle.  Plan to follow along for timing if medical readiness and bed availability.  Note plans to resume PD today and hopeful that patient may be ready to admit next week.  Please call with questions.   Carmelia Roller., CCC/SLP Admission Coordinator  [place]  Cell [phone]

## 2016-11-15 NOTE — Progress Notes (Signed)
[facility]  PROGRESS NOTE  Hayden [name] NLZ:[id number] DOB: [birth year] DOA: 10/23/2016 PCP: Hayden Parish, MD   Brief HPI:   46 y/o male with ESRD on PD, admitted on 4/25 with cellulitis and gangrene of right great toe. He was also noted to be encephalopathic. He was seen by vascular surgery and underwent right great toe amputation and right common femoral artery to below the knee popliteal artery bypass. He is continued on vancomycin. He is undergoing PD per nephrology. Hospital course has been complicated by encephalopathy and worsening delirium, needing precedex infusion. Since being started on precedex, his mental status has started to improve. He has been weaned off precedex and has been started on xanax. Will need physical therapy and possible placement. PT evaluation recommended CIR, inpatient rehabilitation consulted. [facility] assumed care 5/13    Subjective   She continues to be pleasantly confused.   Assessment/Plan:     1. Cellulitis, gangrene of right big toe-status post resection ofof right big toe 1 11/05/2016. Patient was started on IV vancomycin. Culture showed no growth. Vascular surgery has discontinued antibiotics. 2. Acute encephalopathy/delirium- patient's hospital course has been complicated with delirium he has been seen by neurology and psychiatry during this admission. It was felt that patients encephalopathy could be from withdrawal of medication/alcohol. Antipsychotics were not recommended due to prolonged QTc and he was started on Depakote and lorazepam. After surgery patient's delirium worsened and he was transferred to ICU was placed on Precedex infusion. Currently he is off Precedex and on Xanax, Wellbutrin and Depakote. Ammonia level is 30. Wellbutrin dose has been changed to 150 [name] daily, Lexapro 10 mg daily has been started. 3. Peripheral vascular disease- status post right common femoral artery to below the knee popliteal artery bypass.  Vascular surgery following.no new recommendations 4. Hypertensive urgency-blood pressure is now elevated. Continue labetalol, isosorbide, clonidine patch, amlodipine, when necessary hydralazine. 5. ESRD-patient started on hemodialysis per nephrology. 6. Constipation- resolved patient was started on lactulose 20 g 3 times a day for 3 doses.Ammonia level is 30. 7. Anemia of chronic disease- hemoglobin and hematocrit currently stable 8. Hyperlipidemia- continue Pravachol 9. Diabetes mellitus type 2- blood glucose is stable, continue sliding scale insulin with NovoLog. Last 3 blood glucose as follows 62, 98, 203      DVT prophylaxis: Heparin  Code Status: Full code  Family Communication: Discussed with patient's wife at bedside   Disposition Plan: Pending improvement in mental status   Consultants:  Nephrology  Vascular surgery  [facility] CM  Psychiatry  Cardiology  CIR  Procedures: 4/25 Admit  5/07 Seen by Neurology &Psychiatry  5/08 To OR for right femoropopliteal vs femorotibial bypass &right great toe amputation  5/09 Extubated 5/11 Off precedex  5/13 TRH assumed care   Continuous infusions . dialysis solution 1.5% low-MG/low-CA    . valproate sodium Stopped (11/15/16 0957)      Antibiotics:   Anti-infectives    Start     Dose/Rate Route Frequency Ordered Stop   11/13/16 1730  vancomycin (VANCOCIN) IVPB 1000 mg/200 mL premix     1,000 mg 200 mL/hr over 60 Minutes Intravenous  Once 11/13/16 1623 11/13/16 1830   11/12/16 1900  vancomycin (VANCOCIN) IVPB 1000 mg/200 mL premix  Status:  Discontinued     1,000 mg 200 mL/hr over 60 Minutes Intravenous To Hemodialysis 11/12/16 1648 11/13/16 1623   11/09/16 1000  vancomycin (VANCOCIN) 500 mg in sodium chloride 0.9 % 100 mL IVPB  Status:  Discontinued  500 mg 100 mL/hr over 60 Minutes Intravenous Once every 5 days 11/04/16 1326 11/12/16 1648   11/05/16 0800  piperacillin-tazobactam (ZOSYN) IVPB 2.25 g   Status:  Discontinued     2.25 g 100 mL/hr over 30 Minutes Intravenous To Surgery 11/05/16 0747 11/05/16 1413   11/04/16 2200  piperacillin-tazobactam (ZOSYN) IVPB 2.25 g  Status:  Discontinued     2.25 g 100 mL/hr over 30 Minutes Intravenous Every 12 hours 11/04/16 1507 11/07/16 1011   11/04/16 1400  vancomycin (VANCOCIN) 500 mg in sodium chloride 0.9 % 100 mL IVPB     500 mg 100 mL/hr over 60 Minutes Intravenous  Once 11/04/16 1317 11/04/16 1615   10/31/16 2000  piperacillin-tazobactam (ZOSYN) IVPB 3.375 g  Status:  Discontinued     3.375 g 12.5 mL/hr over 240 Minutes Intravenous Every 12 hours 10/31/16 0842 11/04/16 1507   10/30/16 1400  vancomycin (VANCOCIN) 500 mg in sodium chloride 0.9 % 100 mL IVPB  Status:  Discontinued     500 mg 100 mL/hr over 60 Minutes Intravenous Once every 6 days 10/30/16 1306 11/04/16 1317   10/30/16 1000  vancomycin (VANCOCIN) 500 mg in sodium chloride 0.9 % 100 mL IVPB  Status:  Discontinued     500 mg 100 mL/hr over 60 Minutes Intravenous Once every 6 days 10/29/16 1114 10/30/16 1306   10/24/16 0600  piperacillin-tazobactam (ZOSYN) IVPB 3.375 g  Status:  Discontinued     3.375 g 12.5 mL/hr over 240 Minutes Intravenous Every 12 hours 10/23/16 1854 10/31/16 0842   10/23/16 1845  piperacillin-tazobactam (ZOSYN) IVPB 3.375 g     3.375 g 100 mL/hr over 30 Minutes Intravenous  Once 10/23/16 1837 10/23/16 1928   10/23/16 1845  vancomycin (VANCOCIN) 2,000 mg in sodium chloride 0.9 % 500 mL IVPB     2,000 mg 250 mL/hr over 120 Minutes Intravenous  Once 10/23/16 1842 10/23/16 2131       Objective   Vitals:   11/15/16 0300 11/15/16 0542 11/15/16 0752 11/15/16 1319  BP: (!) 182/93 135/75 (!) 151/69 (!) 191/86  Pulse: 67  70 73  Resp: 20 18 17 18   Temp: 98.4 F (36.9 C)  98.5 F (36.9 C) 99.4 F (37.4 C)  TempSrc: Axillary  Oral Oral  SpO2:    98%  Weight:      Height:        Intake/Output Summary (Last 24 hours) at 11/15/16 1351 Last data filed  at 11/15/16 1000  Gross per 24 hour  Intake              310 ml  Output             1000 ml  Net             -690 ml   Filed Weights   11/13/16 1624 11/14/16 1104 11/14/16 1414  Weight: 86.3 kg (190 lb 4.1 oz) 85.7 kg (188 lb 15 oz) 84.7 kg (186 lb 11.7 oz)     Physical Examination:    Physical Exam: Physical Exam: Eyes: No icterus, extraocular muscles intact  Mouth: Oral mucosa is moist, no lesions on palate,  Neck: Supple, no deformities, masses, or tenderness Lungs: Normal respiratory effort, bilateral clear to auscultation, no crackles or wheezes.  Heart: Regular rate and rhythm, S1 and S2 normal, no murmurs, rubs auscultated Abdomen: BS normoactive,soft,nondistended,non-tender to palpation,no organomegaly Extremities:status post amputation of right big toe, dusky erythema on dorsum of foot Neuro : somnolent, not oriented to  time place and person    Data Reviewed: I have personally reviewed following labs and imaging studies  CBG:  Recent Labs Lab 11/15/16 0039 11/15/16 0115 11/15/16 0445 11/15/16 0534 11/15/16 0752  GLUCAP 57* 98 62* 98 203*    CBC:  Recent Labs Lab 11/11/16 0232 11/12/16 1530 11/13/16 0333 11/14/16 0230 11/15/16 0320  WBC 13.3* 13.4* 13.0* 15.1* 12.0*  HGB 8.4* 8.0* 8.5* 8.2* 8.1*  HCT 26.1* 24.9* 27.2* 26.5* 25.7*  MCV 79.6 80.1 81.0 81.8 83.4  PLT 441* 495* 524* 505* 494*    Basic Metabolic Panel:  Recent Labs Lab 11/09/16 0525 11/10/16 0239 11/11/16 0232 11/12/16 1530 11/13/16 0333 11/14/16 0230 11/15/16 0320  NA 135 134* 134* 133* 136 134* 135  K 2.9* 3.0* 3.3* 3.4* 3.5 3.6 3.8  CL 96* 95* 96* 97* 96* 97* 98*  CO2 25 25 24 23 27 27 27   GLUCOSE 100* 106* 105* 75 90 83 64*  BUN 51* 51* 52* 58* 53* 30* 25*  CREATININE 10.76* 11.02* 10.58* 10.27* 9.68* 6.37* 5.12*  CALCIUM 7.3* 7.3* 7.3* 7.3* 7.6* 7.7* 7.8*  MG  --  1.9  --   --   --   --   --   PHOS 7.0* 6.3*  --  5.4*  --   --   --     Recent Results (from the  past 240 hour(s))  Culture, body fluid-bottle     Status: None   Collection Time: 11/09/16  8:36 AM  Result Value Ref Range Status   Specimen Description FLUID PERITONEAL DIALYSIS  Final   Special Requests NONE  Final   Culture NO GROWTH 5 DAYS  Final   Report Status 11/14/2016 FINAL  Final  Gram stain     Status: None   Collection Time: 11/09/16  8:36 AM  Result Value Ref Range Status   Specimen Description FLUID PERITONEAL DIALYSIS  Final   Special Requests NONE  Final   Gram Stain NO WBC SEEN NO ORGANISMS SEEN   Final   Report Status 11/09/2016 FINAL  Final     Liver Function Tests:  Recent Labs Lab 11/09/16 0525 11/12/16 1530  ALBUMIN 1.5* 1.5*   No results for input(s): LIPASE, AMYLASE in the last 168 hours.  Recent Labs Lab 11/13/16 0920  AMMONIA 30    No   Studies: No results found.  Scheduled Meds: . amLODipine  10 mg Oral Daily  . aspirin  81 mg Oral Daily  . budesonide (PULMICORT) nebulizer solution  0.5 mg Nebulization BID  . buPROPion  150 mg Oral q morning - 10a  . calcitRIOL  0.5 mcg Oral Daily  . cinacalcet  60 mg Oral Q supper  . cloNIDine  0.2 mg Transdermal Weekly  . darbepoetin (ARANESP) injection - NON-DIALYSIS  200 mcg Subcutaneous Q Sun-1800  . docusate sodium  100 mg Oral Daily  . escitalopram  10 mg Oral Daily  . feeding supplement (NEPRO CARB STEADY)  237 mL Oral BID BM  . feeding supplement (PRO-STAT SUGAR FREE 64)  30 mL Oral BID  . ferric citrate  420 mg Oral TID WC  . gentamicin cream  1 application Topical Daily  . heparin  5,000 Units Subcutaneous Q8H  . insulin aspart  0-9 Units Subcutaneous Q4H  . isosorbide dinitrate  5 mg Oral BID  . labetalol  300 mg Oral BID  . LORazepam  1 mg Intravenous Q6H  . mouth rinse  15 mL Mouth Rinse BID  .  pantoprazole  40 mg Oral Daily  . pravastatin  20 mg Oral Daily  . saccharomyces boulardii  250 mg Oral BID      Time spent: 25 min  [facility] Pager  [phone]. If 7PM-7AM, please contact night-coverage at [email or web address], Office  [phone]  password [email or web address] 11/15/2016, 1:51 PM  LOS: 22 days

## 2016-11-15 NOTE — Progress Notes (Signed)
[facility] Progress Note   Subjective: Slept "all night", which is new.  Still confused but making more sense.    Vitals:   11/15/16 0300 11/15/16 0542 11/15/16 0752 11/15/16 1319  BP: (!) 182/93 135/75 (!) 151/69 (!) 191/86  Pulse: 67  70 73  Resp: 20 18 17 18   Temp: 98.4 F (36.9 C)  98.5 F (36.9 C) 99.4 F (37.4 C)  TempSrc: Axillary  Oral Oral  SpO2:    98%  Weight:      Height:        Inpatient medications: . amLODipine  10 mg Oral Daily  . aspirin  81 mg Oral Daily  . budesonide (PULMICORT) nebulizer solution  0.5 mg Nebulization BID  . buPROPion  150 mg Oral q morning - 10a  . calcitRIOL  0.5 mcg Oral Daily  . cinacalcet  60 mg Oral Q supper  . cloNIDine  0.2 mg Transdermal Weekly  . darbepoetin (ARANESP) injection - NON-DIALYSIS  200 mcg Subcutaneous Q Sun-1800  . docusate sodium  100 mg Oral Daily  . escitalopram  10 mg Oral Daily  . feeding supplement (NEPRO CARB STEADY)  237 mL Oral BID BM  . feeding supplement (PRO-STAT SUGAR FREE 64)  30 mL Oral BID  . ferric citrate  420 mg Oral TID WC  . gentamicin cream  1 application Topical Daily  . heparin  5,000 Units Subcutaneous Q8H  . insulin aspart  0-9 Units Subcutaneous Q4H  . isosorbide dinitrate  5 mg Oral BID  . labetalol  300 mg Oral BID  . LORazepam  1 mg Intravenous Q6H  . mouth rinse  15 mL Mouth Rinse BID  . pantoprazole  40 mg Oral Daily  . pravastatin  20 mg Oral Daily  . saccharomyces boulardii  250 mg Oral BID   . dialysis solution 1.5% low-MG/low-CA    . valproate sodium Stopped (11/15/16 0957)   ALPRAZolam, fentaNYL (SUBLIMAZE) injection, Gerhardt's butt cream, heparin, hydrALAZINE, HYDROcodone-acetaminophen, prochlorperazine  Exam: More measured and reasonable today No jvd Chest clear bilat RRR no murmur ABd obese, soft ntnd, PD cath in place Ext RLE edema post-op, R 1st toe amp and wounds from fem-po healing well. No LLE edema L forearm AVF +bruit  Dialysis: [facility].  CCPD: 3L fill 5 exchanges at night with 5th fill manually drained 1-1/2 hours later. Uses mostly 1's and 2's at home.       Assessment: 1  Ischemic R foot / sp R fem-pop bypass w great toe amp on 5/8 - on Vanc IV 2  AMS - psychosis w/ agitation; improving w decreasing qhs Wellbutrin and resuming one of his SSRI's that were stopped recently. Sleeping at night.  Don't think that HD really did anything.  Will dc temp cath.  3  ESRD resuming CCPD 4  Vol looks a bit dry 5  Abnormal myoview - per cards prob inaccurate, no further invest 6  Anemia cont darbe , ^'d 200/wk  Plan - resume PD, PT, mobilize    Kelly Splinter MD [facility] pager [phone]   11/15/2016, 1:48 PM    Recent Labs Lab 11/09/16 0525 11/10/16 0239  11/12/16 1530 11/13/16 0333 11/14/16 0230 11/15/16 0320  NA 135 134*  < > 133* 136 134* 135  K 2.9* 3.0*  < > 3.4* 3.5 3.6 3.8  CL 96* 95*  < > 97* 96* 97* 98*  CO2 25 25  < > 23 27 27  27  GLUCOSE 100* 106*  < > 75 90 83 64*  BUN 51* 51*  < > 58* 53* 30* 25*  CREATININE 10.76* 11.02*  < > 10.27* 9.68* 6.37* 5.12*  CALCIUM 7.3* 7.3*  < > 7.3* 7.6* 7.7* 7.8*  PHOS 7.0* 6.3*  --  5.4*  --   --   --   < > = values in this interval not displayed.  Recent Labs Lab 11/09/16 0525 11/12/16 1530  ALBUMIN 1.5* 1.5*    Recent Labs Lab 11/13/16 0333 11/14/16 0230 11/15/16 0320  WBC 13.0* 15.1* 12.0*  HGB 8.5* 8.2* 8.1*  HCT 27.2* 26.5* 25.7*  MCV 81.0 81.8 83.4  PLT 524* 505* 494*   Iron/TIBC/Ferritin/ %Sat    Component Value Date/Time   IRON 7 (L) 11/15/2015 1342   TIBC 164 (L) 11/15/2015 1342   FERRITIN 1,444 (H) 11/15/2015 1342   IRONPCTSAT 4 (L) 11/15/2015 1342

## 2016-11-15 NOTE — Care Management Note (Addendum)
Case Management Note Original note created by; Marvetta Gibbons RN, BSN Unit 2W-Case Manager [phone]  Patient Details  Name: Hayden [name] MRN: [id number] Date of Birth: 1971/02/13  Subjective/Objective:   Pt admitted with necrotic toe, hx ESRD on PD at home. s/p Rightcommon femoral artery to below-the-knee popliteal artery bypass with non-reversed ipsilateral greater saphenous vein  2. Right first ray amputation 3. Right dorsal ulcer debridement -- on 11/05/16--- post op remains on Vent as of 5/9                 Action/Plan: PTA pt lived at home with wife- CM will follow for d/c needs- PT/OT evals pending-   Expected Discharge Date:                  Expected Discharge Plan:  [place]  In-House Referral:     Discharge planning Services  CM Consult  Post Acute Care Choice:    Choice offered to:     DME Arranged:    DME Agency:     HH Arranged:    [place] Agency:     Status of Service:  In process, will continue to follow  If discussed at Long Length of Stay Meetings, dates discussed:    Discharge Disposition:   Additional Comments: 11/15/2016  Pt spoke with attending for consideration for LTACH as CIR has not yet deemed pt appropriate for an admit.  Attending acknowledged LTACH recommendation however informed CM that due to ongoing psychosis with agitation neurology has been consulted, pt is still requiring IV pain medicine and  BP medication, and very new trial of HD not sure of ongoing plan  and therefore he does not feel that LTACH is appropriate at this time.  CM will continue to follow for discharge needs.  CM again informed attending the decision for HD/clipping will be a barrier for CIR to begin insurance auth.   LTACH can not take PD.  Physician Advisor made aware of LTACH referral status and barrier.  10/29/16 CM spoke with attending - attending will assess pt/review chart and follow back up with CM regarding appropriateness of LTACH referral - at  this point attending in not in agreement  Discussed in LOS 5/17 - pt remains appropriate for continued stay.  Per physician advisor pt is [facility] - both agencies accepted referral during meeting.  CM text paged attending to request LTACH referral order  11/12/16 Pt POD #7 s/p R CFA to BK pop w/ ips NR GSV, R 1st ray amp Plan is to d/c staples in another 3-7 days - additional intervention may be warranted.  CIR consulted - CSW consulted as back up plan Maryclare Labrador, RN 11/15/2016, 8:56 AM

## 2016-11-15 NOTE — Progress Notes (Signed)
PT Cancellation Note  Patient Details Name: Hayden [name] MRN: [id number] DOB: 07/25/1970   Cancelled Treatment:    Reason Eval/Treat Not Completed: Medical issues which prohibited therapy  Pt still with temp femoral HD cath impeding mobility. Will follow up.  [place] 11/15/2016, 6:39 AM Wray Kearns, [place], DPT [phone]

## 2016-11-16 LAB — GLUCOSE, CAPILLARY
GLUCOSE-CAPILLARY: 113 mg/dL — AB (ref 65–99)
GLUCOSE-CAPILLARY: 91 mg/dL (ref 65–99)
Glucose-Capillary: 121 mg/dL — ABNORMAL HIGH (ref 65–99)
Glucose-Capillary: 84 mg/dL (ref 65–99)
Glucose-Capillary: 115 mg/dL — ABNORMAL HIGH (ref 65–99)
Glucose-Capillary: 128 mg/dL — ABNORMAL HIGH (ref 65–99)

## 2016-11-16 MED ORDER — ACETAMINOPHEN 325 MG PO TABS
650.0000 mg | ORAL_TABLET | Freq: Four times a day (QID) | ORAL | Status: DC | PRN
  Administered 2016-11-16 – 2016-11-19 (×2): 650 mg via ORAL
  Filled 2016-11-16 (×2): qty 2

## 2016-11-16 MED ORDER — INSULIN ASPART 100 UNIT/ML ~~LOC~~ SOLN: 0.0000 [IU] | Freq: Four times a day (QID) | SUBCUTANEOUS | Status: DC

## 2016-11-16 MED ORDER — HYDRALAZINE HCL 50 MG PO TABS
50.0000 mg | ORAL_TABLET | Freq: Three times a day (TID) | ORAL | Status: DC
Start: 2016-11-16 — End: 2016-11-18
  Administered 2016-11-16 – 2016-11-18 (×6): 50 mg via ORAL
  Filled 2016-11-16 (×6): qty 1

## 2016-11-16 NOTE — Progress Notes (Signed)
[facility]  PROGRESS NOTE  Hayden [name] ASN:[id number] DOB: 04-02-1971 DOA: 10/23/2016 PCP: Corliss Parish, MD   Brief HPI:   46 y/o male with ESRD on PD, admitted on 4/25 with cellulitis and gangrene of right great toe. He was also noted to be encephalopathic. He was seen by vascular surgery and underwent right great toe amputation and right common femoral artery to below the knee popliteal artery bypass. He is continued on vancomycin. He is undergoing PD per nephrology. Hospital course has been complicated by encephalopathy and worsening delirium, needing precedex infusion. Since being started on precedex, his mental status has started to improve. He has been weaned off precedex and has been started on xanax. Will need physical therapy and possible placement. PT evaluation recommended CIR, inpatient rehabilitation consulted.  [facility] assumed care 5/13    Subjective   Patient mental status has improved. Wants to take shower, walk in the hallway.    Assessment/Plan:     1. Cellulitis, gangrene of right big toe-status post resection of right big toe  11/05/2016. Patient was started on IV vancomycin. Culture showed no growth. Vascular surgery has discontinued antibiotics. 2. Acute encephalopathy/delirium-Mental status is slowly improving,  patient's hospital course has been complicated with delirium he has been seen by neurology and psychiatry during this admission. It was felt that patients encephalopathy could be from withdrawal of medication/alcohol. Antipsychotics were not recommended due to prolonged QTc and he was started on Depakote and lorazepam. After surgery patient's delirium worsened and he was transferred to ICU was placed on Precedex infusion. Currently he is off Precedex and on Xanax, Wellbutrin and Depakote. Ammonia level is 30. continue Wellbutrin 150 mg by mouth daily, Lexapro 10 mg by mouth daily. 3. Peripheral vascular disease- status post right common femoral  artery to below the knee popliteal artery bypass. Vascular surgery following.no new recommendations.  4. Hypertensive urgency-blood pressure is now elevated. Continue labetalol, isosorbide, clonidine patch, amlodipine, will restart hydralazine at the new  dose of 50 mg by mouth 3 times a day. 5. ESRD-patient was  started on hemodialysis per nephrology. Now  back on peritoneal dialysis. 6. Constipation- resolved patient was started on lactulose 20 g 3 times a day for 3 doses. Ammonia level is 30. 7. Anemia of chronic disease- hemoglobin and hematocrit currently stable 8. Hyperlipidemia- continue Pravachol 9. Diabetes mellitus type 2- blood glucose is stable, continue sliding scale insulin with NovoLog. Last 3 blood glucose as follows 98, 121, 84.      DVT prophylaxis: Heparin  Code Status: Full code  Family Communication: Discussed with patient's wife at bedside   Disposition Plan: Pending improvement in mental status   Consultants:  Nephrology  Vascular surgery  [facility] CM  Psychiatry  Cardiology  CIR  Procedures: 4/25 Admit  5/07 Seen by Neurology &Psychiatry  5/08 To OR for right femoropopliteal vs femorotibial bypass &right great toe amputation  5/09 Extubated 5/11 Off precedex  5/13 [facility] assumed care   Continuous infusions . dialysis solution 1.5% low-MG/low-CA    . dialysis solution 1.5% low-MG/low-CA    . valproate sodium Stopped (11/16/16 1010)      Antibiotics:   Anti-infectives    Start     Dose/Rate Route Frequency Ordered Stop   11/13/16 1730  vancomycin (VANCOCIN) IVPB 1000 mg/200 mL premix     1,000 mg 200 mL/hr over 60 Minutes Intravenous  Once 11/13/16 1623 11/13/16 1830   11/12/16 1900  vancomycin (VANCOCIN) IVPB 1000 mg/200 mL premix  Status:  Discontinued     1,000 mg 200 mL/hr over 60 Minutes Intravenous To Hemodialysis 11/12/16 1648 11/13/16 1623   11/09/16 1000  vancomycin (VANCOCIN) 500 mg in sodium chloride 0.9 % 100 mL IVPB   Status:  Discontinued     500 mg 100 mL/hr over 60 Minutes Intravenous Once every 5 days 11/04/16 1326 11/12/16 1648   11/05/16 0800  piperacillin-tazobactam (ZOSYN) IVPB 2.25 g  Status:  Discontinued     2.25 g 100 mL/hr over 30 Minutes Intravenous To Surgery 11/05/16 0747 11/05/16 1413   11/04/16 2200  piperacillin-tazobactam (ZOSYN) IVPB 2.25 g  Status:  Discontinued     2.25 g 100 mL/hr over 30 Minutes Intravenous Every 12 hours 11/04/16 1507 11/07/16 1011   11/04/16 1400  vancomycin (VANCOCIN) 500 mg in sodium chloride 0.9 % 100 mL IVPB     500 mg 100 mL/hr over 60 Minutes Intravenous  Once 11/04/16 1317 11/04/16 1615   10/31/16 2000  piperacillin-tazobactam (ZOSYN) IVPB 3.375 g  Status:  Discontinued     3.375 g 12.5 mL/hr over 240 Minutes Intravenous Every 12 hours 10/31/16 0842 11/04/16 1507   10/30/16 1400  vancomycin (VANCOCIN) 500 mg in sodium chloride 0.9 % 100 mL IVPB  Status:  Discontinued     500 mg 100 mL/hr over 60 Minutes Intravenous Once every 6 days 10/30/16 1306 11/04/16 1317   10/30/16 1000  vancomycin (VANCOCIN) 500 mg in sodium chloride 0.9 % 100 mL IVPB  Status:  Discontinued     500 mg 100 mL/hr over 60 Minutes Intravenous Once every 6 days 10/29/16 1114 10/30/16 1306   10/24/16 0600  piperacillin-tazobactam (ZOSYN) IVPB 3.375 g  Status:  Discontinued     3.375 g 12.5 mL/hr over 240 Minutes Intravenous Every 12 hours 10/23/16 1854 10/31/16 0842   10/23/16 1845  piperacillin-tazobactam (ZOSYN) IVPB 3.375 g     3.375 g 100 mL/hr over 30 Minutes Intravenous  Once 10/23/16 1837 10/23/16 1928   10/23/16 1845  vancomycin (VANCOCIN) 2,000 mg in sodium chloride 0.9 % 500 mL IVPB     2,000 mg 250 mL/hr over 120 Minutes Intravenous  Once 10/23/16 1842 10/23/16 2131       Objective   Vitals:   11/16/16 0006 11/16/16 0400 11/16/16 0759 11/16/16 0800  BP: (!) 170/91 (!) 181/70 (!) 168/97 (!) 168/97  Pulse: 73 71 73 73  Resp: 18 15 17  (!) 23  Temp: 97.7 F (36.5  C) 98.1 F (36.7 C) 98.6 F (37 C)   TempSrc: Oral Oral Oral   SpO2: 95% 100% 100% 91%  Weight:  87.8 kg (193 lb 9 oz)    Height:        Intake/Output Summary (Last 24 hours) at 11/16/16 1108 Last data filed at 11/16/16 0910  Gross per 24 hour  Intake            15461 ml  Output            15213 ml  Net              248 ml   Filed Weights   11/14/16 1414 11/15/16 1400 11/16/16 0400  Weight: 84.7 kg (186 lb 11.7 oz) 84.2 kg (185 lb 10 oz) 87.8 kg (193 lb 9 oz)     Physical Examination:     Physical Exam: Eyes: No icterus, extraocular muscles intact  Mouth: Oral mucosa is moist, no lesions on palate,  Neck: Supple, no deformities, masses, or tenderness Lungs: Normal respiratory  effort, bilateral clear to auscultation, no crackles or wheezes.  Heart: Regular rate and rhythm, S1 and S2 normal, no murmurs, rubs auscultated Abdomen: BS normoactive,soft,nondistended,non-tender to palpation,no organomegaly Extremities: Status post amputation of right big toe Neuro : Alert and oriented to  place and person, No focal deficits Skin: No rashes seen on exam    Data Reviewed: I have personally reviewed following labs and imaging studies  CBG:  Recent Labs Lab 11/15/16 1808 11/15/16 2055 11/16/16 0009 11/16/16 0516 11/16/16 0757  GLUCAP 85 111* 91 121* 84    CBC:  Recent Labs Lab 11/11/16 0232 11/12/16 1530 11/13/16 0333 11/14/16 0230 11/15/16 0320  WBC 13.3* 13.4* 13.0* 15.1* 12.0*  HGB 8.4* 8.0* 8.5* 8.2* 8.1*  HCT 26.1* 24.9* 27.2* 26.5* 25.7*  MCV 79.6 80.1 81.0 81.8 83.4  PLT 441* 495* 524* 505* 494*    Basic Metabolic Panel:  Recent Labs Lab 11/10/16 0239 11/11/16 0232 11/12/16 1530 11/13/16 0333 11/14/16 0230 11/15/16 0320  NA 134* 134* 133* 136 134* 135  K 3.0* 3.3* 3.4* 3.5 3.6 3.8  CL 95* 96* 97* 96* 97* 98*  CO2 25 24 23 27 27 27   GLUCOSE 106* 105* 75 90 83 64*  BUN 51* 52* 58* 53* 30* 25*  CREATININE 11.02* 10.58* 10.27* 9.68* 6.37*  5.12*  CALCIUM 7.3* 7.3* 7.3* 7.6* 7.7* 7.8*  MG 1.9  --   --   --   --   --   PHOS 6.3*  --  5.4*  --   --   --     Recent Results (from the past 240 hour(s))  Culture, body fluid-bottle     Status: None   Collection Time: 11/09/16  8:36 AM  Result Value Ref Range Status   Specimen Description FLUID PERITONEAL DIALYSIS  Final   Special Requests NONE  Final   Culture NO GROWTH 5 DAYS  Final   Report Status 11/14/2016 FINAL  Final  Gram stain     Status: None   Collection Time: 11/09/16  8:36 AM  Result Value Ref Range Status   Specimen Description FLUID PERITONEAL DIALYSIS  Final   Special Requests NONE  Final   Gram Stain NO WBC SEEN NO ORGANISMS SEEN   Final   Report Status 11/09/2016 FINAL  Final     Liver Function Tests:  Recent Labs Lab 11/12/16 1530  ALBUMIN 1.5*   No results for input(s): LIPASE, AMYLASE in the last 168 hours.  Recent Labs Lab 11/13/16 0920  AMMONIA 30    No   Studies: No results found.  Scheduled Meds: . amLODipine  10 mg Oral Daily  . aspirin  81 mg Oral Daily  . budesonide (PULMICORT) nebulizer solution  0.5 mg Nebulization BID  . buPROPion  150 mg Oral q morning - 10a  . calcitRIOL  0.5 mcg Oral Daily  . cinacalcet  60 mg Oral Q supper  . cloNIDine  0.2 mg Transdermal Weekly  . darbepoetin (ARANESP) injection - NON-DIALYSIS  200 mcg Subcutaneous Q Sun-1800  . docusate sodium  100 mg Oral Daily  . escitalopram  10 mg Oral Daily  . feeding supplement (NEPRO CARB STEADY)  237 mL Oral BID BM  . feeding supplement (PRO-STAT SUGAR FREE 64)  30 mL Oral BID  . ferric citrate  420 mg Oral TID WC  . gentamicin cream  1 application Topical Daily  . heparin  5,000 Units Subcutaneous Q8H  . insulin aspart  0-9 Units Subcutaneous Q4H  .  isosorbide dinitrate  5 mg Oral BID  . labetalol  300 mg Oral BID  . LORazepam  1 mg Intravenous Q6H  . mouth rinse  15 mL Mouth Rinse BID  . pantoprazole  40 mg Oral Daily  . pravastatin  20 mg Oral  Daily  . saccharomyces boulardii  250 mg Oral BID      Time spent: 25 min  [facility] Pager [phone]. If 7PM-7AM, please contact night-coverage at [email or web address], Office  [phone]  password [email or web address] 11/16/2016, 11:08 AM  LOS: 23 days

## 2016-11-16 NOTE — Progress Notes (Signed)
Requested an order for Tylenol PRN for pain. Patient requesting pain medication but would rather try the Tylenol before the Norco d/t his mental status.

## 2016-11-16 NOTE — Progress Notes (Signed)
Hayden [name] Progress Note   Subjective: gradually improving, less and less manic  Vitals:   11/16/16 0006 11/16/16 0400 11/16/16 0759 11/16/16 0800  BP: (!) 170/91 (!) 181/70 (!) 168/97 (!) 168/97  Pulse: 73 71 73 73  Resp: 18 15 17  (!) 23  Temp: 97.7 F (36.5 C) 98.1 F (36.7 C) 98.6 F (37 C)   TempSrc: Oral Oral Oral   SpO2: 95% 100% 100% 91%  Weight:  87.8 kg (193 lb 9 oz)    Height:        Inpatient medications: . amLODipine  10 mg Oral Daily  . aspirin  81 mg Oral Daily  . budesonide (PULMICORT) nebulizer solution  0.5 mg Nebulization BID  . buPROPion  150 mg Oral q morning - 10a  . calcitRIOL  0.5 mcg Oral Daily  . cinacalcet  60 mg Oral Q supper  . cloNIDine  0.2 mg Transdermal Weekly  . darbepoetin (ARANESP) injection - NON-DIALYSIS  200 mcg Subcutaneous Q Sun-1800  . docusate sodium  100 mg Oral Daily  . escitalopram  10 mg Oral Daily  . feeding supplement (NEPRO CARB STEADY)  237 mL Oral BID BM  . feeding supplement (PRO-STAT SUGAR FREE 64)  30 mL Oral BID  . ferric citrate  420 mg Oral TID WC  . gentamicin cream  1 application Topical Daily  . heparin  5,000 Units Subcutaneous Q8H  . hydrALAZINE  50 mg Oral Q8H  . insulin aspart  0-9 Units Subcutaneous Q4H  . isosorbide dinitrate  5 mg Oral BID  . labetalol  300 mg Oral BID  . LORazepam  1 mg Intravenous Q6H  . mouth rinse  15 mL Mouth Rinse BID  . pantoprazole  40 mg Oral Daily  . pravastatin  20 mg Oral Daily  . saccharomyces boulardii  250 mg Oral BID   . dialysis solution 1.5% low-MG/low-CA    . dialysis solution 1.5% low-MG/low-CA    . valproate sodium Stopped (11/16/16 1010)   ALPRAZolam, fentaNYL (SUBLIMAZE) injection, Gerhardt's butt cream, dianeal solution for CAPD/CCPD with heparin, HYDROcodone-acetaminophen, prochlorperazine  Exam: Less confused and manic No jvd Chest clear bilat RRR no murmur ABd obese, soft ntnd, PD cath in place Ext RLE edema post-op, R 1st toe amp  and wounds from fem-pop healing well. No LLE edema L forearm AVF +bruit  Dialysis: [place] [facility].  CCPD: 3L fill 5 exchanges at night with 5th fill manually drained 1-1/2 hours later. Uses mostly 1's and 2's at home.       Assessment: 1  Ischemic R foot / sp R fem-pop bypass w great toe amp on 5/8 - on Vanc IV 2  AMS - psychosis w/ agitation; improving w decreasing Wellbutrin and resuming one of his SSRI's that were stopped recently. Sleeping at night again, mania resolving.  HD didn't help, have dc'd temp cath.  3  ESRD resumed CCPD 4  Vol looks a bit dry 5  Abnormal myoview - per cards prob inaccurate, no further invest 6  Anemia cont darbe , ^'d 200/wk 7  HTN on 4 BP meds, stable but a bit high  Plan - resumed PD, PT, mobilize    Kelly Splinter MD [name] pager [phone]   11/16/2016, 12:11 PM    Recent Labs Lab 11/10/16 0239  11/12/16 1530 11/13/16 0333 11/14/16 0230 11/15/16 0320  NA 134*  < > 133* 136 134* 135  K 3.0*  < > 3.4* 3.5 3.6 3.8  CL 95*  < > 97* 96* 97* 98*  CO2 25  < > 23 27 27 27   GLUCOSE 106*  < > 75 90 83 64*  BUN 51*  < > 58* 53* 30* 25*  CREATININE 11.02*  < > 10.27* 9.68* 6.37* 5.12*  CALCIUM 7.3*  < > 7.3* 7.6* 7.7* 7.8*  PHOS 6.3*  --  5.4*  --   --   --   < > = values in this interval not displayed.  Recent Labs Lab 11/12/16 1530  ALBUMIN 1.5*    Recent Labs Lab 11/13/16 0333 11/14/16 0230 11/15/16 0320  WBC 13.0* 15.1* 12.0*  HGB 8.5* 8.2* 8.1*  HCT 27.2* 26.5* 25.7*  MCV 81.0 81.8 83.4  PLT 524* 505* 494*   Iron/TIBC/Ferritin/ %Sat    Component Value Date/Time   IRON 7 (L) 11/15/2015 1342   TIBC 164 (L) 11/15/2015 1342   FERRITIN 1,444 (H) 11/15/2015 1342   IRONPCTSAT 4 (L) 11/15/2015 1342

## 2016-11-17 LAB — BASIC METABOLIC PANEL
Anion gap: 12 (ref 5–15)
BUN: 43 mg/dL — AB (ref 6–20)
CALCIUM: 8.4 mg/dL — AB (ref 8.9–10.3)
CO2: 26 mmol/L (ref 22–32)
Chloride: 97 mmol/L — ABNORMAL LOW (ref 101–111)
Creatinine, Ser: 6.49 mg/dL — ABNORMAL HIGH (ref 0.61–1.24)
GFR calc Af Amer: 11 mL/min — ABNORMAL LOW (ref >60)
GFR, EST NON AFRICAN AMERICAN: 9 mL/min — AB (ref >60)
Glucose, Bld: 90 mg/dL (ref 65–99)
POTASSIUM: 3.5 mmol/L (ref 3.5–5.1)
SODIUM: 135 mmol/L (ref 135–145)

## 2016-11-17 LAB — CBC
HCT: 28.2 % — ABNORMAL LOW (ref 39.0–52.0)
Hemoglobin: 8.4 g/dL — ABNORMAL LOW (ref 13.0–17.0)
MCH: 24.6 pg — AB (ref 26.0–34.0)
MCHC: 29.8 g/dL — ABNORMAL LOW (ref 30.0–36.0)
MCV: 82.7 fL (ref 78.0–100.0)
PLATELETS: 507 10*3/uL — AB (ref 150–400)
RBC: 3.41 MIL/uL — AB (ref 4.22–5.81)
RDW: 21.6 % — ABNORMAL HIGH (ref 11.5–15.5)
WBC: 12.3 10*3/uL — ABNORMAL HIGH (ref 4.0–10.5)

## 2016-11-17 LAB — GLUCOSE, CAPILLARY
GLUCOSE-CAPILLARY: 92 mg/dL (ref 65–99)
GLUCOSE-CAPILLARY: 82 mg/dL (ref 65–99)
GLUCOSE-CAPILLARY: 109 mg/dL — AB (ref 65–99)

## 2016-11-17 MED ORDER — HEPARIN 1000 UNIT/ML FOR PERITONEAL DIALYSIS: 500.0000 [IU] | INTRAMUSCULAR | Status: DC | PRN

## 2016-11-17 MED ORDER — DELFLEX-LC/1.5% DEXTROSE 344 MOSM/L IP SOLN: INTRAPERITONEAL | Status: DC

## 2016-11-17 MED ORDER — DELFLEX-LC/2.5% DEXTROSE 394 MOSM/L IP SOLN
INTRAPERITONEAL | Status: DC | PRN
  Filled 2016-11-17: qty 5000

## 2016-11-17 MED ORDER — INSULIN ASPART 100 UNIT/ML ~~LOC~~ SOLN: 0.0000 [IU] | Freq: Three times a day (TID) | SUBCUTANEOUS | Status: DC

## 2016-11-17 MED ORDER — DELFLEX-LC/2.5% DEXTROSE 394 MOSM/L IP SOLN: INTRAPERITONEAL | Status: DC

## 2016-11-17 MED ORDER — GENTAMICIN SULFATE 0.1 % EX CREA
1.0000 "application " | TOPICAL_CREAM | Freq: Every day | CUTANEOUS | Status: DC
  Administered 2016-11-17 – 2016-11-20 (×2): 1 via TOPICAL
  Filled 2016-11-17: qty 15

## 2016-11-17 NOTE — Progress Notes (Signed)
[facility] Progress Note   Subjective: getting back towards baseline, sleeping well.   Vitals:   11/17/16 0308 11/17/16 0800 11/17/16 0830 11/17/16 1200  BP: (!) 179/89  (!) 183/83   Pulse: 69 70  71  Resp: 20 16 14 17   Temp: 98 F (36.7 C)  98.5 F (36.9 C)   TempSrc: Oral  Axillary   SpO2: 99% 92% 93% 98%  Weight: 83.6 kg (184 lb 4.9 oz)     Height:        Inpatient medications: . amLODipine  10 mg Oral Daily  . aspirin  81 mg Oral Daily  . budesonide (PULMICORT) nebulizer solution  0.5 mg Nebulization BID  . calcitRIOL  0.5 mcg Oral Daily  . cinacalcet  60 mg Oral Q supper  . cloNIDine  0.2 mg Transdermal Weekly  . darbepoetin (ARANESP) injection - NON-DIALYSIS  200 mcg Subcutaneous Q Sun-1800  . docusate sodium  100 mg Oral Daily  . escitalopram  10 mg Oral Daily  . feeding supplement (NEPRO CARB STEADY)  237 mL Oral BID BM  . feeding supplement (PRO-STAT SUGAR FREE 64)  30 mL Oral BID  . ferric citrate  420 mg Oral TID WC  . gentamicin cream  1 application Topical Daily  . heparin  5,000 Units Subcutaneous Q8H  . hydrALAZINE  50 mg Oral Q8H  . insulin aspart  0-9 Units Subcutaneous QID  . isosorbide dinitrate  5 mg Oral BID  . labetalol  300 mg Oral BID  . LORazepam  1 mg Intravenous Q6H  . mouth rinse  15 mL Mouth Rinse BID  . pantoprazole  40 mg Oral Daily  . pravastatin  20 mg Oral Daily  . saccharomyces boulardii  250 mg Oral BID   . dialysis solution 1.5% low-MG/low-CA    . dialysis solution 1.5% low-MG/low-CA    . valproate sodium Stopped (11/17/16 0947)   acetaminophen, ALPRAZolam, fentaNYL (SUBLIMAZE) injection, Gerhardt's butt cream, dianeal solution for CAPD/CCPD with heparin, HYDROcodone-acetaminophen, prochlorperazine  Exam: Less confused and manic No jvd Chest clear bilat RRR no murmur ABd obese, soft ntnd, PD cath in place Ext RLE edema post-op, R 1st toe amp and wounds from fem-pop healing well. No LLE edema L forearm AVF  +bruit  Dialysis: [facility].  CCPD: 3L fill 5 exchanges at night with 5th fill manually drained 1-1/2 hours later. Uses mostly 1's and 2's at home.       Assessment: 1  Ischemic R foot / sp R fem-pop bypass w great toe amp on 5/8 - sp[ course of IV Vanc 2  AMS - psychosis w/ agitation; improving w decreasing Wellbutrin (activating) and resuming Zoloft (calming); mania resolving 3  ESRD cont CCPD 4  Vol - looks stable, down 30 lbs from admission 25 days ago 5  Anemia cont darbe , ^'d 200/wk 6  HTN on 4 BP meds, bp's on high side  Plan - cont PD, ^UF to half 1.5 and half 2.5%   Kelly Splinter MD [facility] pager [phone]   11/17/2016, 1:18 PM    Recent Labs Lab 11/12/16 1530  11/14/16 0230 11/15/16 0320 11/17/16 0325  NA 133*  < > 134* 135 135  K 3.4*  < > 3.6 3.8 3.5  CL 97*  < > 97* 98* 97*  CO2 23  < > 27 27 26   GLUCOSE 75  < > 83 64* 90  BUN 58*  < > 30* 25* 43*  CREATININE 10.27*  < > 6.37* 5.12* 6.49*  CALCIUM 7.3*  < > 7.7* 7.8* 8.4*  PHOS 5.4*  --   --   --   --   < > = values in this interval not displayed.  Recent Labs Lab 11/12/16 1530  ALBUMIN 1.5*    Recent Labs Lab 11/14/16 0230 11/15/16 0320 11/17/16 0325  WBC 15.1* 12.0* 12.3*  HGB 8.2* 8.1* 8.4*  HCT 26.5* 25.7* 28.2*  MCV 81.8 83.4 82.7  PLT 505* 494* 507*   Iron/TIBC/Ferritin/ %Sat    Component Value Date/Time   IRON 7 (L) 11/15/2015 1342   TIBC 164 (L) 11/15/2015 1342   FERRITIN 1,444 (H) 11/15/2015 1342   IRONPCTSAT 4 (L) 11/15/2015 1342

## 2016-11-17 NOTE — Progress Notes (Signed)
   Daily Progress Note   Assessment/Planning:   POD #12 s/p R CFA to BK pop BPG NR ips GSV   Staples removed  Decompressing hematoma in distal vein harvest  Swelling in lower leg greatly improved  R foot appears improved  Mental status improved  Continue wound care:  Reportedly ProFore bandage will get replaced tomorrow  Dry dressing to R groin   Subjective  - 12 Days Post-Op   More coherent today   Objective   Vitals:   11/16/16 2010 11/16/16 2028 11/17/16 0308 11/17/16 0830  BP: (!) 188/83  (!) 179/89 (!) 183/83  Pulse: 69  69   Resp: 15  20 14   Temp: 98 F (36.7 C)  98 F (36.7 C) 98.5 F (36.9 C)  TempSrc: Oral  Oral Axillary  SpO2: 94% 98% 99% 93%  Weight:   184 lb 4.9 oz (83.6 kg)   Height:         Intake/Output Summary (Last 24 hours) at 11/17/16 0910 Last data filed at 11/16/16 2052  Gross per 24 hour  Intake              655 ml  Output                0 ml  Net              655 ml     PULM  CTAB  CV  RRR  GI  soft, NTND  VASC R groin: somewhat macerated but c/d/i, staples removed, mostly intact except distal vein harvest with some separation due decompressing hematoma, slough of skin at calf exposure, viable skin underneath, R foot: inc c/d/i, some dried blood present, prior blistered skin stable, no extension, some mild R knee contracture  NEURO Sensation intact in R foot, motor intact    Laboratory CBC CBC Latest Ref Rng & Units 11/17/2016 11/15/2016 11/14/2016  WBC 4.0 - 10.5 K/uL 12.3(H) 12.0(H) 15.1(H)  Hemoglobin 13.0 - 17.0 g/dL 8.4(L) 8.1(L) 8.2(L)  Hematocrit 39.0 - 52.0 % 28.2(L) 25.7(L) 26.5(L)  Platelets 150 - 400 K/uL 507(H) 494(H) 505(H)     BMET    Component Value Date/Time   NA 135 11/17/2016 0325   K 3.5 11/17/2016 0325   CL 97 (L) 11/17/2016 0325   CO2 26 11/17/2016 0325   GLUCOSE 90 11/17/2016 0325   BUN 43 (H) 11/17/2016 0325   CREATININE 6.49 (H) 11/17/2016 0325   CALCIUM 8.4 (L) 11/17/2016 0325   GFRNONAA  9 (L) 11/17/2016 0325   GFRAA 11 (L) 11/17/2016 0325      Adele Barthel, MD, FACS [facility] Office: [phone] Pager: [phone]  11/17/2016, 9:10 AM

## 2016-11-17 NOTE — Progress Notes (Addendum)
[facility]  PROGRESS NOTE  Hayden [name] IDP:[id number] DOB: 1971-03-10 DOA: 10/23/2016 PCP: Corliss Parish, MD   Brief HPI:   46 y/o male with ESRD on PD, admitted on 4/25 with cellulitis and gangrene of right great toe. He was also noted to be encephalopathic. He was seen by vascular surgery and underwent right great toe amputation and right common femoral artery to below the knee popliteal artery bypass. He is continued on vancomycin. He is undergoing PD per nephrology. Hospital course has been complicated by encephalopathy and worsening delirium, needing precedex infusion. Since being started on precedex, his mental status has started to improve. He has been weaned off precedex and has been started on xanax. Will need physical therapy and possible placement. PT evaluation recommended CIR, inpatient rehabilitation consulted.  [facility] assumed care 5/13    Subjective   Patient's mental status has improved.     Assessment/Plan:     1. Cellulitis, gangrene of right big toe-status post resection of right big toe  11/05/2016. Patient was started on IV vancomycin. Culture showed no growth. Vascular surgery has discontinued antibiotics. 2. Acute encephalopathy/delirium-Mental status is slowly improving,after cutting down Wellbutrin to 150 mg by mouth daily and restarting Lexapro 10 mg daily  patient's hospital course has been complicated with delirium he has been seen by neurology and psychiatry during this admission. It was felt that patients encephalopathy could be from withdrawal of medication/alcohol. Antipsychotics were not recommended due to prolonged QTc and he was started on Depakote and lorazepam. After surgery patient's delirium worsened and he was transferred to ICU was placed on Precedex infusion. Currently he is off Precedex and on Xanax, Wellbutrin and Depakote. Ammonia level is 30. continue Wellbutrin 150 mg by mouth daily, Lexapro 10 mg by mouth daily.Will continue the  current treatment. 3. Peripheral vascular disease- status post right common femoral artery to below the knee popliteal artery bypass. Vascular surgery following. No new recommendations  4. Hypertensive urgency-blood pressure has improved after starting hydralazine . Continue labetalol, isosorbide, clonidine patch, amlodipine, will restart hydralazine at the new  dose of 50 mg by mouth 3 times a day. 5. ESRD-patient was  started on hemodialysis per nephrology. Now  back on peritoneal dialysis. 6. Constipation- resolved patient was started on lactulose 20 g 3 times a day for 3 doses. Ammonia level is 30. 7. Anemia of chronic disease- hemoglobin and hematocrit currently stable 8. Hyperlipidemia- continue Pravachol 9. Diabetes mellitus type 2- blood glucose is stable, continue sliding scale insulin with NovoLog. Last 3 blood glucose as follows 98, 121, 84.      DVT prophylaxis: Heparin  Code Status: Full code  Family Communication: Discussed with patient's wife at bedside   Disposition Plan: Pending improvement in mental status   Consultants:  Nephrology  Vascular surgery  [place] [facility] CM  Psychiatry  Cardiology  CIR  Procedures: 4/25 Admit  5/07 Seen by Neurology &Psychiatry  5/08 To OR for right femoropopliteal vs femorotibial bypass &right great toe amputation  5/09 Extubated 5/11 Off precedex  5/13 [facility] assumed care   Continuous infusions . dialysis solution 1.5% low-MG/low-CA    . dialysis solution 1.5% low-MG/low-CA    . dialysis solution 2.5% low-MG/low-CA    . valproate sodium Stopped (11/17/16 0947)      Antibiotics:   Anti-infectives    Start     Dose/Rate Route Frequency Ordered Stop   11/13/16 1730  vancomycin (VANCOCIN) IVPB 1000 mg/200 mL premix     1,000 mg 200 mL/hr over  60 Minutes Intravenous  Once 11/13/16 1623 11/13/16 1830   11/12/16 1900  vancomycin (VANCOCIN) IVPB 1000 mg/200 mL premix  Status:  Discontinued     1,000 mg 200 mL/hr over 60  Minutes Intravenous To Hemodialysis 11/12/16 1648 11/13/16 1623   11/09/16 1000  vancomycin (VANCOCIN) 500 mg in sodium chloride 0.9 % 100 mL IVPB  Status:  Discontinued     500 mg 100 mL/hr over 60 Minutes Intravenous Once every 5 days 11/04/16 1326 11/12/16 1648   11/05/16 0800  piperacillin-tazobactam (ZOSYN) IVPB 2.25 g  Status:  Discontinued     2.25 g 100 mL/hr over 30 Minutes Intravenous To Surgery 11/05/16 0747 11/05/16 1413   11/04/16 2200  piperacillin-tazobactam (ZOSYN) IVPB 2.25 g  Status:  Discontinued     2.25 g 100 mL/hr over 30 Minutes Intravenous Every 12 hours 11/04/16 1507 11/07/16 1011   11/04/16 1400  vancomycin (VANCOCIN) 500 mg in sodium chloride 0.9 % 100 mL IVPB     500 mg 100 mL/hr over 60 Minutes Intravenous  Once 11/04/16 1317 11/04/16 1615   10/31/16 2000  piperacillin-tazobactam (ZOSYN) IVPB 3.375 g  Status:  Discontinued     3.375 g 12.5 mL/hr over 240 Minutes Intravenous Every 12 hours 10/31/16 0842 11/04/16 1507   10/30/16 1400  vancomycin (VANCOCIN) 500 mg in sodium chloride 0.9 % 100 mL IVPB  Status:  Discontinued     500 mg 100 mL/hr over 60 Minutes Intravenous Once every 6 days 10/30/16 1306 11/04/16 1317   10/30/16 1000  vancomycin (VANCOCIN) 500 mg in sodium chloride 0.9 % 100 mL IVPB  Status:  Discontinued     500 mg 100 mL/hr over 60 Minutes Intravenous Once every 6 days 10/29/16 1114 10/30/16 1306   10/24/16 0600  piperacillin-tazobactam (ZOSYN) IVPB 3.375 g  Status:  Discontinued     3.375 g 12.5 mL/hr over 240 Minutes Intravenous Every 12 hours 10/23/16 1854 10/31/16 0842   10/23/16 1845  piperacillin-tazobactam (ZOSYN) IVPB 3.375 g     3.375 g 100 mL/hr over 30 Minutes Intravenous  Once 10/23/16 1837 10/23/16 1928   10/23/16 1845  vancomycin (VANCOCIN) 2,000 mg in sodium chloride 0.9 % 500 mL IVPB     2,000 mg 250 mL/hr over 120 Minutes Intravenous  Once 10/23/16 1842 10/23/16 2131       Objective   Vitals:   11/17/16 0800 11/17/16  0830 11/17/16 1200 11/17/16 1327  BP:  (!) 183/83  (!) 164/87  Pulse: 70  71 70  Resp: 16 14 17 14   Temp:  98.5 F (36.9 C)  98.2 F (36.8 C)  TempSrc:  Axillary  Oral  SpO2: 92% 93% 98% (!) 89%  Weight:      Height:        Intake/Output Summary (Last 24 hours) at 11/17/16 1407 Last data filed at 11/17/16 1200  Gross per 24 hour  Intake              830 ml  Output                0 ml  Net              830 ml   Filed Weights   11/15/16 1400 11/16/16 0400 11/17/16 0308  Weight: 84.2 kg (185 lb 10 oz) 87.8 kg (193 lb 9 oz) 83.6 kg (184 lb 4.9 oz)     Physical Examination:     Physical Exam: Physical Exam: Eyes: No icterus, extraocular muscles  intact  Mouth: Oral mucosa is moist, no lesions on palate,  Neck: Supple, no deformities, masses, or tenderness Lungs: Normal respiratory effort, bilateral clear to auscultation, no crackles or wheezes.  Heart: Regular rate and rhythm, S1 and S2 normal, no murmurs, rubs auscultated Abdomen: BS normoactive,soft,nondistended,non-tender to palpation,no organomegaly Extremities: Status post amputation of right big toe Neuro : Alert and oriented to time, place and person, No focal deficits Skin: No rashes seen on exam    Data Reviewed: I have personally reviewed following labs and imaging studies  CBG:  Recent Labs Lab 11/16/16 0757 11/16/16 1216 11/16/16 1750 11/16/16 2010 11/17/16 1326  GLUCAP 84 115* 113* 128* 92    CBC:  Recent Labs Lab 11/12/16 1530 11/13/16 0333 11/14/16 0230 11/15/16 0320 11/17/16 0325  WBC 13.4* 13.0* 15.1* 12.0* 12.3*  HGB 8.0* 8.5* 8.2* 8.1* 8.4*  HCT 24.9* 27.2* 26.5* 25.7* 28.2*  MCV 80.1 81.0 81.8 83.4 82.7  PLT 495* 524* 505* 494* 507*    Basic Metabolic Panel:  Recent Labs Lab 11/12/16 1530 11/13/16 0333 11/14/16 0230 11/15/16 0320 11/17/16 0325  NA 133* 136 134* 135 135  K 3.4* 3.5 3.6 3.8 3.5  CL 97* 96* 97* 98* 97*  CO2 23 27 27 27 26   GLUCOSE 75 90 83 64* 90  BUN  58* 53* 30* 25* 43*  CREATININE 10.27* 9.68* 6.37* 5.12* 6.49*  CALCIUM 7.3* 7.6* 7.7* 7.8* 8.4*  PHOS 5.4*  --   --   --   --     Recent Results (from the past 240 hour(s))  Culture, body fluid-bottle     Status: None   Collection Time: 11/09/16  8:36 AM  Result Value Ref Range Status   Specimen Description FLUID PERITONEAL DIALYSIS  Final   Special Requests NONE  Final   Culture NO GROWTH 5 DAYS  Final   Report Status 11/14/2016 FINAL  Final  Gram stain     Status: None   Collection Time: 11/09/16  8:36 AM  Result Value Ref Range Status   Specimen Description FLUID PERITONEAL DIALYSIS  Final   Special Requests NONE  Final   Gram Stain NO WBC SEEN NO ORGANISMS SEEN   Final   Report Status 11/09/2016 FINAL  Final     Liver Function Tests:  Recent Labs Lab 11/12/16 1530  ALBUMIN 1.5*   No results for input(s): LIPASE, AMYLASE in the last 168 hours.  Recent Labs Lab 11/13/16 0920  AMMONIA 30    No   Studies: No results found.  Scheduled Meds: . amLODipine  10 mg Oral Daily  . aspirin  81 mg Oral Daily  . budesonide (PULMICORT) nebulizer solution  0.5 mg Nebulization BID  . calcitRIOL  0.5 mcg Oral Daily  . cinacalcet  60 mg Oral Q supper  . cloNIDine  0.2 mg Transdermal Weekly  . darbepoetin (ARANESP) injection - NON-DIALYSIS  200 mcg Subcutaneous Q Sun-1800  . docusate sodium  100 mg Oral Daily  . escitalopram  10 mg Oral Daily  . feeding supplement (NEPRO CARB STEADY)  237 mL Oral BID BM  . feeding supplement (PRO-STAT SUGAR FREE 64)  30 mL Oral BID  . ferric citrate  420 mg Oral TID WC  . gentamicin cream  1 application Topical Daily  . heparin  5,000 Units Subcutaneous Q8H  . hydrALAZINE  50 mg Oral Q8H  . insulin aspart  0-9 Units Subcutaneous QID  . isosorbide dinitrate  5 mg Oral BID  .  labetalol  300 mg Oral BID  . LORazepam  1 mg Intravenous Q6H  . mouth rinse  15 mL Mouth Rinse BID  . pantoprazole  40 mg Oral Daily  . pravastatin  20 mg Oral  Daily  . saccharomyces boulardii  250 mg Oral BID      Time spent: 25 min  [facility] Pager [phone]. If 7PM-7AM, please contact night-coverage at [email or web address], Office  [phone]  password [email or web address] 11/17/2016, 2:07 PM  LOS: 24 days

## 2016-11-18 LAB — GLUCOSE, CAPILLARY
GLUCOSE-CAPILLARY: 112 mg/dL — AB (ref 65–99)
GLUCOSE-CAPILLARY: 110 mg/dL — AB (ref 65–99)
Glucose-Capillary: 91 mg/dL (ref 65–99)
Glucose-Capillary: 99 mg/dL (ref 65–99)

## 2016-11-18 LAB — BASIC METABOLIC PANEL
Anion gap: 10 (ref 5–15)
BUN: 43 mg/dL — ABNORMAL HIGH (ref 6–20)
CALCIUM: 8.3 mg/dL — AB (ref 8.9–10.3)
CHLORIDE: 97 mmol/L — AB (ref 101–111)
CO2: 27 mmol/L (ref 22–32)
CREATININE: 6.65 mg/dL — AB (ref 0.61–1.24)
GFR calc Af Amer: 11 mL/min — ABNORMAL LOW (ref >60)
GFR calc non Af Amer: 9 mL/min — ABNORMAL LOW (ref >60)
GLUCOSE: 97 mg/dL (ref 65–99)
Potassium: 3.3 mmol/L — ABNORMAL LOW (ref 3.5–5.1)
Sodium: 134 mmol/L — ABNORMAL LOW (ref 135–145)

## 2016-11-18 MED ORDER — RENA-VITE PO TABS
1.0000 | ORAL_TABLET | Freq: Every day | ORAL | Status: DC
  Administered 2016-11-18 – 2016-11-19 (×2): 1 via ORAL
  Filled 2016-11-18 (×2): qty 1

## 2016-11-18 MED ORDER — HYDRALAZINE HCL 50 MG PO TABS
75.0000 mg | ORAL_TABLET | Freq: Three times a day (TID) | ORAL | Status: DC
  Administered 2016-11-18 – 2016-11-20 (×6): 75 mg via ORAL
  Filled 2016-11-18 (×7): qty 1

## 2016-11-18 MED ORDER — DELFLEX-LC/2.5% DEXTROSE 394 MOSM/L IP SOLN: INTRAPERITONEAL | Status: DC

## 2016-11-18 NOTE — Care Management Note (Addendum)
Case Management Note Original note created by; Marvetta Gibbons RN, BSN Unit 2W-Case Manager [phone]  Patient Details  Name: Hayden [name] MRN: [id number] Date of Birth: [birth year]  Subjective/Objective:   Pt admitted with necrotic toe, hx ESRD on PD at home. s/p Rightcommon femoral artery to below-the-knee popliteal artery bypass with non-reversed ipsilateral greater saphenous vein  2. Right first ray amputation 3. Right dorsal ulcer debridement -- on 11/05/16--- post op remains on Vent as of 5/9                 Action/Plan: PTA pt lived at home with wife- CM will follow for d/c needs- PT/OT evals pending-   Expected Discharge Date:                  Expected Discharge Plan:  [place]  In-House Referral:     Discharge planning Services  CM Consult  Post Acute Care Choice:    Choice offered to:     DME Arranged:    DME Agency:     HH Arranged:    [place] Agency:     Status of Service:  In process, will continue to follow  If discussed at Long Length of Stay Meetings, dates discussed:    Discharge Disposition:   Additional Comments: 11/18/2016  CM received LTACH referral today - however pt has now converted back to PD and LTACHs will not take PD pts.  CM spoke with CIR - admission is unlikely.  CSW consulted for discharge plan  11/15/16 Pt spoke with attending for consideration for LTACH as CIR has not yet deemed pt appropriate for an admit.  Attending acknowledged LTACH recommendation however informed CM that due to ongoing psychosis with agitation neurology has been consulted, pt is still requiring IV pain medicine and  BP medication, and very new trial of HD not sure of ongoing plan  and therefore he does not feel that LTACH is appropriate at this time.  CM will continue to follow for discharge needs.  CM again informed attending the decision for HD/clipping will be a barrier for CIR to begin insurance auth.   LTACH can not take PD.  Physician Advisor  made aware of LTACH referral status and barrier.  10/29/16 CM spoke with attending - attending will assess pt/review chart and follow back up with CM regarding appropriateness of LTACH referral - at this point attending in not in agreement  Discussed in LOS 5/17 - pt remains appropriate for continued stay.  Per physician advisor pt is appropriate for [facility] - both agencies accepted referral during meeting.  CM text paged attending to request LTACH referral order  11/12/16 Pt POD #7 s/p R CFA to BK pop w/ ips NR GSV, R 1st ray amp Plan is to d/c staples in another 3-7 days - additional intervention may be warranted.  CIR consulted - CSW consulted as back up plan Maryclare Labrador, RN 11/18/2016, 11:59 AM

## 2016-11-18 NOTE — Progress Notes (Signed)
Physical Therapy Treatment Patient Details Name: Hayden [name] MRN: [id number] DOB: 1970/12/11 Today's Date: 11/18/2016    History of Present Illness Pt is a 46 y.o. M  admitted 4/25 for dry gangrene and cellulitis on the right foot./Right great toe,  and is now s/p  right CFA to below knee popliteal artery bypass, right first toe amputation, right dorsal ulcer debridement performed on 5/8.  He was extubated morning of 5/9. Pt with post-op encephalopathy and agitation. PMH - ESRD on peritoneal dialysis, HTN, CVA, kidney CA.    PT Comments    Patient progressing well today. Tolerated standing x2 with Min A for safety and able to take a few steps to get to chair.  Pt's mentation fluctuating today- confused at times and delusional at times which seems to correlate to pain level. Requires cues to stay attended to task and is easily redirectable. Very motivated to return home. Appropriate for CIR. Will follow.   Follow Up Recommendations  CIR;Supervision/Assistance - 24 hour     Equipment Recommendations       Recommendations for Other Services       Precautions / Restrictions Precautions Precautions: Fall Precaution Comments: Pt confused and delusional at times  Restrictions Weight Bearing Restrictions: No    Mobility  Bed Mobility Overal bed mobility: Needs Assistance Bed Mobility: Supine to Sit;Sit to Supine Rolling: Supervision   Supine to sit: Supervision Sit to supine: Supervision   General bed mobility comments: supervision for safety   Transfers Overall transfer level: Needs assistance Equipment used: Rolling walker (2 wheeled) Transfers: Sit to/from [facility] Sit to Stand: Min assist;+2 physical assistance Stand pivot transfers: Min guard;+2 physical assistance       General transfer comment: Pt requires assist to boost into standing, and assist to maneuver walker and maintain balance. Stood from [employer] x2  Ambulation/Gait Ambulation/Gait  assistance: Min assist;+2 physical assistance;+2 safety/equipment Ambulation Distance (Feet): 2 Feet Assistive device: Rolling walker (2 wheeled) Gait Pattern/deviations: Step-to pattern;Trunk flexed (hop to)   Gait velocity interpretation: Below normal speed for age/gender General Gait Details: Assist for pivotal steps from bed to recliner.   Stairs            Wheelchair Mobility    Modified Rankin (Stroke Patients Only)       Balance Overall balance assessment: Needs assistance Sitting-balance support: Feet supported Sitting balance-Leahy Scale: Good     Standing balance support: During functional activity Standing balance-Leahy Scale: Poor Standing balance comment: requires bil. UE support and min A                             Cognition Arousal/Alertness: Awake/alert Behavior During Therapy:  (fluctuates) Overall Cognitive Status: Impaired/Different from baseline Area of Impairment: Orientation;Attention;Memory;Following commands;Safety/judgement;Awareness;Problem solving                 Orientation Level: Disoriented to;Time;Place Current Attention Level: Sustained Memory: Decreased short-term memory Following Commands: Follows one step commands consistently Safety/Judgement: Decreased awareness of safety;Decreased awareness of deficits   Problem Solving: Slow processing;Requires verbal cues;Requires tactile cues General Comments: Pt interactive, but confused.  Thinks he's at behavioral health.  At times, he is inappropriate, but at other times, he was appropriate.   Confusion seems to increase with pain increase.         Exercises      General Comments General comments (skin integrity, edema, etc.): Wife present.       Pertinent Vitals/Pain  Pain Assessment: 0-10 Pain Score: 8  Pain Location: Rt LE  Pain Descriptors / Indicators: Grimacing;Guarding;Throbbing Pain Intervention(s): Monitored during session;Repositioned;Limited activity  within patient's tolerance;Patient requesting pain meds-RN notified;RN gave pain meds during session    Home Living                      Prior Function            PT Goals (current goals can now be found in the care plan section) Progress towards PT goals: Progressing toward goals    Frequency    Min 3X/week      PT Plan Current plan remains appropriate    Co-evaluation PT/OT/SLP Co-Evaluation/Treatment: Yes Reason for Co-Treatment: For patient/therapist safety;To address functional/ADL transfers PT goals addressed during session: Mobility/safety with mobility OT goals addressed during session: ADL's and self-care      AM-PAC PT "6 Clicks" Daily Activity  Outcome Measure  Difficulty turning over in bed (including adjusting bedclothes, sheets and blankets)?: None Difficulty moving from lying on back to sitting on the side of the bed? : None Difficulty sitting down on and standing up from a chair with arms (e.g., wheelchair, bedside commode, etc,.)?: Total Help needed moving to and from a bed to chair (including a wheelchair)?: A Little Help needed walking in hospital room?: A Little Help needed climbing 3-5 steps with a railing? : Total 6 Click Score: 8    End of Session   Activity Tolerance: Patient limited by pain;Patient tolerated treatment well Patient left: in chair;with call bell/phone within reach;with chair alarm set;with family/visitor present Nurse Communication: Mobility status PT Visit Diagnosis: Other abnormalities of gait and mobility (R26.89);Pain Pain - Right/Left: Right Pain - part of body: Leg     Time: 1120-1155 PT Time Calculation (min) (ACUTE ONLY): 35 min  Charges:  $Therapeutic Activity: 8-22 mins                    G Codes:       Wray Kearns, PT, DPT [phone]     Marguarite Arbour A Sherrod Toothman 11/18/2016, 2:53 PM

## 2016-11-18 NOTE — Progress Notes (Signed)
Inpatient Rehabilitation  I saw pt. and wife at the bedside. Pt. Is asking if he can check himself out.  Wife is anxious for pt. to come to IP Rehab, however I do not have a bed to offer him today.  Additionally, therapies are about to see pt. which will assist me in determining if pt. can tolerate the rigors of CIR.  I have updated Dr. Darrick Meigs and  Elenor Quinones, [place] . I will follow up tomorrow if pt. remains inhouse.   Please call if questions.  [place] Admissions Coordinator Cell [phone] Office [phone]

## 2016-11-18 NOTE — Progress Notes (Signed)
After pt's bath after incontinent spell, pt refused to have groin sites cleaned with betadyne and refused gauze being placed at sites.  Educated pt but pt still refused.  Emotional support provided.

## 2016-11-18 NOTE — Progress Notes (Signed)
Occupational Therapy Treatment Patient Details Name: EZEKEIL BETHEL MRN: [id number] DOB: 13-Nov-1970 Today's Date: 11/18/2016    History of present illness Pt is a 46 y.o. M  admitted 4/25 for dry gangrene and cellulitis on the right foot./Right great toe,  and is now s/p  right CFA to below knee popliteal artery bypass, right first toe amputation, right dorsal ulcer debridement performed on 5/8.  He was extubated morning of 5/9. Pt with post-op encephalopathy and agitation. PMH - ESRD on peritoneal dialysis, HTN, CVA, kidney CA.   OT comments  Pt significantly improved.  He continues to be confused, and delusional at times, but able to sustain attention to task with cues, and is fairly easily redirected.  He was able to stand pivot to recliner with min A +2, and perform ADLs with mod A, overall.  He is very motivated to go home.  Feel he would benefit from the structure and routine of CIR as long as cognitive/mental status continues as is/or improves.   Follow Up Recommendations  CIR;Supervision/Assistance - 24 hour    Equipment Recommendations  None recommended by OT    Recommendations for Other Services      Precautions / Restrictions Precautions Precautions: Fall Precaution Comments: Pt confused and delusional at times        Mobility Bed Mobility Overal bed mobility: Needs Assistance Bed Mobility: Supine to Sit;Sit to Supine Rolling: Supervision   Supine to sit: Supervision     General bed mobility comments: supervision for safety   Transfers Overall transfer level: Needs assistance Equipment used: Rolling walker (2 wheeled) Transfers: Sit to/from [employer] Transfers Sit to Stand: Min assist;+2 physical assistance Stand pivot transfers: Min guard;+2 physical assistance       General transfer comment: Pt requires assist to boost into standing, and assist to maneuver walker and maintain balance     Balance Overall balance assessment: Needs  assistance Sitting-balance support: Feet supported Sitting balance-Leahy Scale: Good     Standing balance support: Bilateral upper extremity supported Standing balance-Leahy Scale: Poor Standing balance comment: requires bil. UE support and min A                            ADL either performed or assessed with clinical judgement   ADL Overall ADL's : Needs assistance/impaired     Grooming: Wash/dry hands;Wash/dry face;Oral care;Brushing hair;Set up;Supervision/safety;Sitting   Upper Body Bathing: Moderate assistance;Sitting   Lower Body Bathing: Moderate assistance;Sit to/from stand   Upper Body Dressing : Moderate assistance;Sitting   Lower Body Dressing: Maximal assistance;Sit to/from stand   Toilet Transfer: Minimal assistance;+2 for physical assistance;Stand-pivot;RW   Toileting- Clothing Manipulation and Hygiene: Maximal assistance;Sit to/from stand       Functional mobility during ADLs: Minimal assistance;+2 for physical assistance;Rolling walker       Vision       Perception     Praxis      Cognition Arousal/Alertness: Awake/alert Behavior During Therapy:  (fluctuates ) Overall Cognitive Status: Impaired/Different from baseline Area of Impairment: Orientation;Attention;Memory;Following commands;Safety/judgement;Awareness;Problem solving                 Orientation Level: Disoriented to;Time;Place (knows hes at [facility], but thinks he on behavioral ward ) Current Attention Level: Sustained Memory: Decreased short-term memory Following Commands: Follows one step commands consistently Safety/Judgement: Decreased awareness of safety;Decreased awareness of deficits   Problem Solving: Slow processing;Requires verbal cues;Requires tactile cues General Comments: Pt interactive, but confused.  Thinks he's at behavioral health.  At times, he is inappropriate, but at other times, he was appropriate.   Confusion seems to increase with pain increase.            Exercises     Shoulder Instructions       General Comments wife present.  Pt reports he wants to go home     Pertinent Vitals/ Pain       Pain Assessment: 0-10 Pain Score: 8  Pain Location: Rt LE  Pain Descriptors / Indicators: Grimacing;Guarding;Throbbing (increased confusion ) Pain Intervention(s): Monitored during session;Repositioned;RN gave pain meds during session;Patient requesting pain meds-RN notified  Home Living                                          Prior Functioning/Environment              Frequency  Min 3X/week        Progress Toward Goals  OT Goals(current goals can now be found in the care plan section)  Progress towards OT goals: Progressing toward goals     Plan Discharge plan remains appropriate    Co-evaluation    PT/OT/SLP Co-Evaluation/Treatment: Yes Reason for Co-Treatment: For patient/therapist safety;To address functional/ADL transfers   OT goals addressed during session: ADL's and self-care      AM-PAC PT "6 Clicks" Daily Activity     Outcome Measure   Help from another person eating meals?: A Little Help from another person taking care of personal grooming?: A Little Help from another person toileting, which includes using toliet, bedpan, or urinal?: A Lot Help from another person bathing (including washing, rinsing, drying)?: A Lot Help from another person to put on and taking off regular upper body clothing?: A Lot Help from another person to put on and taking off regular lower body clothing?: A Lot 6 Click Score: 14    End of Session Equipment Utilized During Treatment: Rolling walker  OT Visit Diagnosis: Pain;Cognitive communication deficit (R41.841) Pain - Right/Left: Right Pain - part of body: Leg   Activity Tolerance Patient tolerated treatment well   Patient Left in chair;with call bell/phone within reach;with family/visitor present;with chair alarm set   Nurse Communication  Mobility status;Patient requests pain meds        Time: 1443-1540 OT Time Calculation (min): 36 min  Charges: OT General Charges $OT Visit: 1 Procedure OT Treatments $Therapeutic Activity: 8-22 mins  [employer], OTR/L [phone]    Lucille Passy M 11/18/2016, 1:05 PM

## 2016-11-18 NOTE — Progress Notes (Signed)
Patients wife provided a list of medications that he was taking prior to admission. Wife also states he has a significant mental health history. She states, "he had a breakdown a month after their daughter was born due to the passing of his father and was diagnosed with Bipolar Disorder with episodes of mania and something else she can't remember. That was 19-20 yrs ago. She states he was seeing a counselor and made a lot of progress, but the psychologist/psychiatrist moved and he didn't like anyone he saw after so his Nephrologist has been prescribing all his meds including his psych meds. His wife states he won't go to any other doctor.   The list that she gave me is as follows: Aspirin 81 mg daily  Amlodipine besylate 10 mg daily  Bupropion XL 150mg  in am and 300mg  at bedtime Labetolol 150 mg BID Omeprazole 20 mg daily Sensipar 60 mg daily Isosorbide dinitrate 10 mg daily Hydralalzine 100 mg BID Paxil 40 mg at bedtime Pravastatin 20 mg daily Temazepam 15 mg at bedtime PRN approximately twice a week Xanax 1mg  daily PRN Clonidine 0.2mg  patch every Sunday Auryxia 210 mg 3 times a day before or during a meal  All daily medications were given in am around 0900 and all pm medications were given around 2300.

## 2016-11-18 NOTE — Progress Notes (Signed)
Patient's BP 197/86 HR 74,BP meds already given for tonight. Text paged MD on call. Awaiting call back. Incoming RN made aware. Lesly Joslyn, Wonda Cheng, Therapist, sports

## 2016-11-18 NOTE — Progress Notes (Signed)
11/18/2016 3:24 PM  Report received. Room is ready for patient.   Laquandra Carrillo [employer], RN-BC [employer] Phone [phone]

## 2016-11-18 NOTE — Progress Notes (Signed)
[facility] Progress Note   Subjective:  Slurred speech. Wife says not at baseline yet, but improving. BP^^ c/o HA getting morning meds   Objective Vitals:   11/18/16 0704 11/18/16 0800 11/18/16 0805 11/18/16 0819  BP: (!) 189/92 (!) 189/92 (!) 197/92 (!) 204/98  Pulse: 71 70 72 71  Resp: 14 19 17 14   Temp: 97.6 F (36.4 C)   97.6 F (36.4 C)  TempSrc: Oral   Oral  SpO2: 96% 98% 93% 93%  Weight:    85.2 kg (187 lb 13.3 oz)  Height:       Physical Exam General: WNWD NAD Heart: RRR 3/6 systolic murmur  Lungs: CTAB Abdomen: soft NT/ND  Extremities: no LE edema R foot in ACE wrap  Dialysis Access: PD cath in place/ LUE AVF +bruit    Additional Objective  Recent Labs Lab 11/12/16 1530  11/15/16 0320 11/17/16 0325 11/18/16 0301  NA 133*  < > 135 135 134*  K 3.4*  < > 3.8 3.5 3.3*  CL 97*  < > 98* 97* 97*  CO2 23  < > 27 26 27   GLUCOSE 75  < > 64* 90 97  BUN 58*  < > 25* 43* 43*  CREATININE 10.27*  < > 5.12* 6.49* 6.65*  CALCIUM 7.3*  < > 7.8* 8.4* 8.3*  PHOS 5.4*  --   --   --   --   < > = values in this interval not displayed.   Recent Labs Lab 11/12/16 1530  ALBUMIN 1.5*    Recent Labs Lab 11/12/16 1530 11/13/16 0333 11/14/16 0230 11/15/16 0320 11/17/16 0325  WBC 13.4* 13.0* 15.1* 12.0* 12.3*  HGB 8.0* 8.5* 8.2* 8.1* 8.4*  HCT 24.9* 27.2* 26.5* 25.7* 28.2*  MCV 80.1 81.0 81.8 83.4 82.7  PLT 495* 524* 505* 494* 507*       Component Value Date/Time   SDES FLUID PERITONEAL DIALYSIS 11/09/2016 0836   SDES FLUID PERITONEAL DIALYSIS 11/09/2016 0836   SPECREQUEST NONE 11/09/2016 0836   SPECREQUEST NONE 11/09/2016 0836   CULT NO GROWTH 5 DAYS 11/09/2016 0836   REPTSTATUS 11/14/2016 FINAL 11/09/2016 0836   REPTSTATUS 11/09/2016 FINAL 11/09/2016 0836    Recent Labs Lab 11/16/16 2010 11/17/16 1326 11/17/16 1713 11/17/16 2241 11/18/16 0801  GLUCAP 128* 92 82 109* 91   Medications: . dialysis solution 1.5% low-MG/low-CA    .  dialysis solution 1.5% low-MG/low-CA    . dialysis solution 2.5% low-MG/low-CA    . valproate sodium Stopped (11/17/16 2117)   . amLODipine  10 mg Oral Daily  . aspirin  81 mg Oral Daily  . budesonide (PULMICORT) nebulizer solution  0.5 mg Nebulization BID  . calcitRIOL  0.5 mcg Oral Daily  . cinacalcet  60 mg Oral Q supper  . cloNIDine  0.2 mg Transdermal Weekly  . darbepoetin (ARANESP) injection - NON-DIALYSIS  200 mcg Subcutaneous Q Sun-1800  . docusate sodium  100 mg Oral Daily  . escitalopram  10 mg Oral Daily  . feeding supplement (NEPRO CARB STEADY)  237 mL Oral BID BM  . feeding supplement (PRO-STAT SUGAR FREE 64)  30 mL Oral BID  . ferric citrate  420 mg Oral TID WC  . gentamicin cream  1 application Topical Daily  . heparin  5,000 Units Subcutaneous Q8H  . hydrALAZINE  75 mg Oral Q8H  . insulin aspart  0-9 Units Subcutaneous TID WC  . isosorbide dinitrate  5 mg Oral BID  . labetalol  300 mg Oral BID  . LORazepam  1 mg Intravenous Q6H  . mouth rinse  15 mL Mouth Rinse BID  . pantoprazole  40 mg Oral Daily  . pravastatin  20 mg Oral Daily  . saccharomyces boulardii  250 mg Oral BID     Dialysis Orders:  [facility].  CCPD: 3L fill 5 exchanges at night with 5th fill manually drained 1-1/2 hours later. Uses mostly 1's and 2's at home.  Assessment/Plan: 1. Cellulitis, Ischemic R foot / sp R fem-pop bypass w great toe amp on 5/8 - s/p course of IV Vanc Blood cultures no growth per primary  2. AMS/delerium - psychosis w agitation - extensive psych history per wife - meds per primary  3. ESRD - CCPD using 1.5% 2.5% - pre HD wt 83.8kg - at edw per wife  4. 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 HTN/volume - BP^^^ this am hasn't had morning meds yet/ gradually adding back home meds I think still too high - use all 2.5% dialysate tonight 7. Nutrition - renal vit/protein supp for low albumin   Lynnda Child PA-C [place] Pager [phone] 11/18/2016,8:48 AM  LOS: 25 days   I have seen and examined this patient and agree with plan and assessment in the above note with renal recommendations/intervention highlighted. Will continue to monitor his PD. BP elevated so use all 2.5% dialysate tonight. Still not sure of disposition.   Hayden Bossier B,MD 11/18/2016 1:50 PM

## 2016-11-18 NOTE — Progress Notes (Addendum)
[facility]  PROGRESS NOTE  Hayden [name] KDX:[id number] DOB: Oct 11, 1970 DOA: 10/23/2016 PCP: Corliss Parish, MD   Brief HPI:   46 y/o male with ESRD on PD, admitted on 4/25 with cellulitis and gangrene of right great toe. He was also noted to be encephalopathic. He was seen by vascular surgery and underwent right great toe amputation and right common femoral artery to below the knee popliteal artery bypass. He is continued on vancomycin. He is undergoing PD per nephrology. Hospital course has been complicated by encephalopathy and worsening delirium, needing precedex infusion. Since being started on precedex, his mental status has started to improve. He has been weaned off precedex and has been started on xanax. Will need physical therapy and possible placement. PT evaluation recommended CIR, inpatient rehabilitation consulted.  [facility] assumed care 5/13    Subjective   Patient's mental status is slowly improving.    Assessment/Plan:     1. Cellulitis, gangrene of right big toe-status post resection of right big toe  11/05/2016. Patient was started on IV vancomycin. Culture showed no growth. Vascular surgery has discontinued antibiotics.Wound care following. 2. Acute encephalopathy/delirium-Mental status is slowly improving,after cutting down Wellbutrin to 150 mg by mouth daily and restarting Lexapro 10 mg daily  patient's hospital course has been complicated with delirium he has been seen by neurology and psychiatry during this admission. It was felt that patients encephalopathy could be from withdrawal of medication/alcohol. Antipsychotics were not recommended due to prolonged QTc and he was started on Depakote and lorazepam. After surgery patient's delirium worsened and he was transferred to ICU was placed on Precedex infusion. Currently he is off Precedex and on Xanax, Wellbutrin and Depakote. Ammonia level is 30. continue Wellbutrin 150 mg by mouth daily, Lexapro 10 mg by mouth  daily. Continue current treatment. 3. Peripheral vascular disease- status post right common femoral artery to below the knee popliteal artery bypass. Vascular surgery following. No new recommendations. 4. Hypertensive urgency-blood pressure is still elevated  after starting hydralazine . Continue labetalol, isosorbide, clonidine patch, amlodipine, will change the dose of hydralazine to 75 mg by mouth 3 times a day. 5. ESRD-patient was  started on hemodialysis per nephrology. Now  back on peritoneal dialysis. 6. Constipation- resolved patient was started on lactulose 20 g 3 times a day for 3 doses. Ammonia level is 30. 7. Anemia of chronic disease- hemoglobin and hematocrit currently stable 8. Hyperlipidemia- continue Pravachol 9. Diabetes mellitus type 2- blood glucose is stable, continue sliding scale insulin with NovoLog. Last 3 blood glucose as follows 91, 109, 82      DVT prophylaxis: Heparin  Code Status: Full code  Family Communication: Discussed with patient's wife at bedside   Disposition Plan: Patient's mental status has improved. No beds available at inpatient rehabilitation. I had earlier discussed with patient's wife regarding long-term acute care facility. We'll consult case management for LTAC placement   Consultants:  Nephrology  Vascular surgery  [facility] CM  Psychiatry  Cardiology  CIR  Procedures: 4/25 Admit  5/07 Seen by Neurology &Psychiatry  5/08 To OR for right femoropopliteal vs femorotibial bypass &right great toe amputation  5/09 Extubated 5/11 Off precedex  5/13 [facility] assumed care   Continuous infusions . dialysis solution 1.5% low-MG/low-CA    . dialysis solution 1.5% low-MG/low-CA    . dialysis solution 2.5% low-MG/low-CA    . valproate sodium Stopped (11/18/16 0950)      Antibiotics:   Anti-infectives    Start     Dose/Rate  Route Frequency Ordered Stop   11/13/16 1730  vancomycin (VANCOCIN) IVPB 1000 mg/200 mL premix     1,000  mg 200 mL/hr over 60 Minutes Intravenous  Once 11/13/16 1623 11/13/16 1830   11/12/16 1900  vancomycin (VANCOCIN) IVPB 1000 mg/200 mL premix  Status:  Discontinued     1,000 mg 200 mL/hr over 60 Minutes Intravenous To Hemodialysis 11/12/16 1648 11/13/16 1623   11/09/16 1000  vancomycin (VANCOCIN) 500 mg in sodium chloride 0.9 % 100 mL IVPB  Status:  Discontinued     500 mg 100 mL/hr over 60 Minutes Intravenous Once every 5 days 11/04/16 1326 11/12/16 1648   11/05/16 0800  piperacillin-tazobactam (ZOSYN) IVPB 2.25 g  Status:  Discontinued     2.25 g 100 mL/hr over 30 Minutes Intravenous To Surgery 11/05/16 0747 11/05/16 1413   11/04/16 2200  piperacillin-tazobactam (ZOSYN) IVPB 2.25 g  Status:  Discontinued     2.25 g 100 mL/hr over 30 Minutes Intravenous Every 12 hours 11/04/16 1507 11/07/16 1011   11/04/16 1400  vancomycin (VANCOCIN) 500 mg in sodium chloride 0.9 % 100 mL IVPB     500 mg 100 mL/hr over 60 Minutes Intravenous  Once 11/04/16 1317 11/04/16 1615   10/31/16 2000  piperacillin-tazobactam (ZOSYN) IVPB 3.375 g  Status:  Discontinued     3.375 g 12.5 mL/hr over 240 Minutes Intravenous Every 12 hours 10/31/16 0842 11/04/16 1507   10/30/16 1400  vancomycin (VANCOCIN) 500 mg in sodium chloride 0.9 % 100 mL IVPB  Status:  Discontinued     500 mg 100 mL/hr over 60 Minutes Intravenous Once every 6 days 10/30/16 1306 11/04/16 1317   10/30/16 1000  vancomycin (VANCOCIN) 500 mg in sodium chloride 0.9 % 100 mL IVPB  Status:  Discontinued     500 mg 100 mL/hr over 60 Minutes Intravenous Once every 6 days 10/29/16 1114 10/30/16 1306   10/24/16 0600  piperacillin-tazobactam (ZOSYN) IVPB 3.375 g  Status:  Discontinued     3.375 g 12.5 mL/hr over 240 Minutes Intravenous Every 12 hours 10/23/16 1854 10/31/16 0842   10/23/16 1845  piperacillin-tazobactam (ZOSYN) IVPB 3.375 g     3.375 g 100 mL/hr over 30 Minutes Intravenous  Once 10/23/16 1837 10/23/16 1928   10/23/16 1845  vancomycin  (VANCOCIN) 2,000 mg in sodium chloride 0.9 % 500 mL IVPB     2,000 mg 250 mL/hr over 120 Minutes Intravenous  Once 10/23/16 1842 10/23/16 2131       Objective   Vitals:   11/18/16 0800 11/18/16 0805 11/18/16 0819 11/18/16 0940  BP: (!) 189/92 (!) 197/92 (!) 204/98   Pulse: 70 72 71 74  Resp: 19 17 14 16   Temp:   97.6 F (36.4 C)   TempSrc:   Oral   SpO2: 98% 93% 93%   Weight:   85.2 kg (187 lb 13.3 oz)   Height:        Intake/Output Summary (Last 24 hours) at 11/18/16 1107 Last data filed at 11/18/16 0950  Gross per 24 hour  Intake            30562 ml  Output            16277 ml  Net            14285 ml   Filed Weights   11/17/16 1402 11/17/16 1830 11/18/16 0819  Weight: 83.5 kg (184 lb 1.4 oz) 83.8 kg (184 lb 11.9 oz) 85.2 kg (187 lb 13.3 oz)  Physical Examination:     Physical Exam: Eyes: No icterus, extraocular muscles intact  Mouth: Oral mucosa is moist, no lesions on palate,  Neck: Supple, no deformities, masses, or tenderness Lungs: Normal respiratory effort, bilateral clear to auscultation, no crackles or wheezes.  Heart: Regular rate and rhythm, S1 and S2 normal, no murmurs, rubs auscultated Abdomen: BS normoactive,soft,nondistended,non-tender to palpation,no organomegaly Extremities: No pretibial edema, no erythema, no cyanosis, no clubbing Neuro : Alert and oriented to time, place and person, No focal deficits Skin: No rashes seen on exam    Data Reviewed: I have personally reviewed following labs and imaging studies  CBG:  Recent Labs Lab 11/16/16 2010 11/17/16 1326 11/17/16 1713 11/17/16 2241 11/18/16 0801  GLUCAP 128* 92 82 109* 91    CBC:  Recent Labs Lab 11/12/16 1530 11/13/16 0333 11/14/16 0230 11/15/16 0320 11/17/16 0325  WBC 13.4* 13.0* 15.1* 12.0* 12.3*  HGB 8.0* 8.5* 8.2* 8.1* 8.4*  HCT 24.9* 27.2* 26.5* 25.7* 28.2*  MCV 80.1 81.0 81.8 83.4 82.7  PLT 495* 524* 505* 494* 507*    Basic Metabolic Panel:  Recent  Labs Lab 11/12/16 1530 11/13/16 0333 11/14/16 0230 11/15/16 0320 11/17/16 0325 11/18/16 0301  NA 133* 136 134* 135 135 134*  K 3.4* 3.5 3.6 3.8 3.5 3.3*  CL 97* 96* 97* 98* 97* 97*  CO2 23 27 27 27 26 27   GLUCOSE 75 90 83 64* 90 97  BUN 58* 53* 30* 25* 43* 43*  CREATININE 10.27* 9.68* 6.37* 5.12* 6.49* 6.65*  CALCIUM 7.3* 7.6* 7.7* 7.8* 8.4* 8.3*  PHOS 5.4*  --   --   --   --   --     Recent Results (from the past 240 hour(s))  Culture, body fluid-bottle     Status: None   Collection Time: 11/09/16  8:36 AM  Result Value Ref Range Status   Specimen Description FLUID PERITONEAL DIALYSIS  Final   Special Requests NONE  Final   Culture NO GROWTH 5 DAYS  Final   Report Status 11/14/2016 FINAL  Final  Gram stain     Status: None   Collection Time: 11/09/16  8:36 AM  Result Value Ref Range Status   Specimen Description FLUID PERITONEAL DIALYSIS  Final   Special Requests NONE  Final   Gram Stain NO WBC SEEN NO ORGANISMS SEEN   Final   Report Status 11/09/2016 FINAL  Final     Liver Function Tests:  Recent Labs Lab 11/12/16 1530  ALBUMIN 1.5*   No results for input(s): LIPASE, AMYLASE in the last 168 hours.  Recent Labs Lab 11/13/16 0920  AMMONIA 30    No   Studies: No results found.  Scheduled Meds: . amLODipine  10 mg Oral Daily  . aspirin  81 mg Oral Daily  . budesonide (PULMICORT) nebulizer solution  0.5 mg Nebulization BID  . calcitRIOL  0.5 mcg Oral Daily  . cinacalcet  60 mg Oral Q supper  . cloNIDine  0.2 mg Transdermal Weekly  . darbepoetin (ARANESP) injection - NON-DIALYSIS  200 mcg Subcutaneous Q Sun-1800  . docusate sodium  100 mg Oral Daily  . escitalopram  10 mg Oral Daily  . feeding supplement (NEPRO CARB STEADY)  237 mL Oral BID BM  . feeding supplement (PRO-STAT SUGAR FREE 64)  30 mL Oral BID  . ferric citrate  420 mg Oral TID WC  . gentamicin cream  1 application Topical Daily  . heparin  5,000 Units Subcutaneous Q8H  .  hydrALAZINE   75 mg Oral Q8H  . insulin aspart  0-9 Units Subcutaneous TID WC  . isosorbide dinitrate  5 mg Oral BID  . labetalol  300 mg Oral BID  . LORazepam  1 mg Intravenous Q6H  . mouth rinse  15 mL Mouth Rinse BID  . multivitamin  1 tablet Oral QHS  . pantoprazole  40 mg Oral Daily  . pravastatin  20 mg Oral Daily  . saccharomyces boulardii  250 mg Oral BID      Time spent: 25 min  [facility] Pager [phone]. If 7PM-7AM, please contact night-coverage at [email or web address], Office  [phone]  password [email or web address] 11/18/2016, 11:07 AM  LOS: 25 days

## 2016-11-18 NOTE — Consult Note (Addendum)
[facility] Nurse wound follow-up consult note Reason for Consult: Profore application to Right leg Wound type: Steritrips in place to previous surgical sites on right leg; foot, upper leg.   Drainage Small amt bloody drainage to right upper leg.  Dressing procedure/placement/frequency: 4 layer compression wraps of Profore applied to right leg as requested by ortho service.  Xeroform applied over steristrip areas, then 4X4s, prior to compression. Pt's wife present; discussed dressing change Q Mon/Thurs and need for home health after discharge if there is an order to continue Profore at that time.Bedside nurse to page ortho service if inner leg wound continues to have bloody drainage through the Profore wraps. Julien Girt MSN, RN, [place], [place], [place]

## 2016-11-18 NOTE — Progress Notes (Signed)
Patient very agitated and unable to sleep. Now stating there is "a little person at the end of the bed". Speech is slurred and garbled, which has been his baseline for weeks. Highly agitated asking for his discharge paperwork. Wife at bedside and exhausted. Staff pulled patient up in bed. Patient states his leg, foot, and buttocks hurt and he can't get comfortable. Received his scheduled dose of Ativan and PRN Fentanyl. He asked to speak with the male student nurse alone (except wife). The patient tells the male student nurse that he has "blue balls" and has had ED for 3 years now. He asks him to arrange for a "male student nurse" to give him a sponge bath. The nurse explained he received a bath earlier in the shift and they would wash him up again in am, but not tonight, nor did we have a male Stage manager. Will give medications time to work and turn down lights and hope he can fall asleep. Will continue to monitor.

## 2016-11-19 LAB — CBC
HCT: 30.6 % — ABNORMAL LOW (ref 39.0–52.0)
HEMOGLOBIN: 9.3 g/dL — AB (ref 13.0–17.0)
MCH: 25.5 pg — AB (ref 26.0–34.0)
MCHC: 30.4 g/dL (ref 30.0–36.0)
MCV: 84.1 fL (ref 78.0–100.0)
Platelets: 599 10*3/uL — ABNORMAL HIGH (ref 150–400)
RBC: 3.64 MIL/uL — AB (ref 4.22–5.81)
RDW: 22.1 % — ABNORMAL HIGH (ref 11.5–15.5)
WBC: 13.6 10*3/uL — ABNORMAL HIGH (ref 4.0–10.5)

## 2016-11-19 LAB — RENAL FUNCTION PANEL
ANION GAP: 12 (ref 5–15)
Albumin: 1.7 g/dL — ABNORMAL LOW (ref 3.5–5.0)
BUN: 42 mg/dL — ABNORMAL HIGH (ref 6–20)
CO2: 27 mmol/L (ref 22–32)
Calcium: 8.7 mg/dL — ABNORMAL LOW (ref 8.9–10.3)
Chloride: 98 mmol/L — ABNORMAL LOW (ref 101–111)
Creatinine, Ser: 7.39 mg/dL — ABNORMAL HIGH (ref 0.61–1.24)
GFR calc non Af Amer: 8 mL/min — ABNORMAL LOW (ref >60)
GFR, EST AFRICAN AMERICAN: 9 mL/min — AB (ref >60)
Glucose, Bld: 115 mg/dL — ABNORMAL HIGH (ref 65–99)
Phosphorus: 4.5 mg/dL (ref 2.5–4.6)
Potassium: 3 mmol/L — ABNORMAL LOW (ref 3.5–5.1)
SODIUM: 137 mmol/L (ref 135–145)

## 2016-11-19 LAB — GLUCOSE, CAPILLARY
GLUCOSE-CAPILLARY: 143 mg/dL — AB (ref 65–99)
GLUCOSE-CAPILLARY: 115 mg/dL — AB (ref 65–99)
GLUCOSE-CAPILLARY: 107 mg/dL — AB (ref 65–99)
GLUCOSE-CAPILLARY: 97 mg/dL (ref 65–99)

## 2016-11-19 MED ORDER — DIVALPROEX SODIUM 125 MG PO CSDR
500.0000 mg | DELAYED_RELEASE_CAPSULE | Freq: Two times a day (BID) | ORAL | Status: DC
  Administered 2016-11-19 – 2016-11-20 (×2): 500 mg via ORAL
  Filled 2016-11-19 (×3): qty 4

## 2016-11-19 MED ORDER — LORAZEPAM 1 MG PO TABS
1.0000 mg | ORAL_TABLET | Freq: Four times a day (QID) | ORAL | Status: DC | PRN
  Administered 2016-11-19: 1 mg via ORAL
  Filled 2016-11-19: qty 1

## 2016-11-19 MED ORDER — HYDRALAZINE HCL 20 MG/ML IJ SOLN
10.0000 mg | Freq: Once | INTRAMUSCULAR | Status: AC
  Administered 2016-11-19: 10 mg via INTRAVENOUS
  Filled 2016-11-19: qty 1

## 2016-11-19 MED ORDER — POTASSIUM CHLORIDE CRYS ER 20 MEQ PO TBCR
40.0000 meq | EXTENDED_RELEASE_TABLET | Freq: Once | ORAL | Status: AC
  Administered 2016-11-19: 40 meq via ORAL
  Filled 2016-11-19: qty 2

## 2016-11-19 NOTE — Progress Notes (Signed)
Physical Therapy Treatment Patient Details Name: Hayden [name] MRN: [id number] DOB: 03-Feb-1971 Today's Date: 11/19/2016    History of Present Illness Pt is a 46 y.o. M  admitted 4/25 for dry gangrene and cellulitis on the right foot./Right great toe,  and is now s/p  right CFA to below knee popliteal artery bypass, right first toe amputation, right dorsal ulcer debridement performed on 5/8.  He was extubated morning of 5/9. Pt with post-op encephalopathy and agitation. PMH - ESRD on peritoneal dialysis, HTN, CVA, kidney CA.    PT Comments    Continuing work on functional mobility and activity tolerance;  [name] was able to participate in mobility and ADL training today; He very much wants to know exactly what we are going to do and needs the time to ask questions as well; Essentially keeps NWB RLE when taking steps, and knows that it will be good for him to try to get foot/heel to the floor for ankle ROM, activity tolerance, and to normalize gait; Continue to recommend comprehensive inpatient rehab (CIR) for post-acute therapy needs.    Follow Up Recommendations  CIR;Supervision/Assistance - 24 hour     Equipment Recommendations  Rolling walker with 5" wheels    Recommendations for Other Services       Precautions / Restrictions Precautions Precautions: Fall Precaution Comments: Pt confused and delusional at times  Restrictions Weight Bearing Restrictions: No    Mobility  Bed Mobility               General bed mobility comments: In chair upon arrival  Transfers Overall transfer level: Needs assistance Equipment used: Rolling walker (2 wheeled) Transfers: Sit to/from Stand Sit to Stand: Mod assist;+2 safety/equipment         General transfer comment: Cues for hand placement and safety; Light mod assist to power up and steady  Ambulation/Gait Ambulation/Gait assistance: Min guard;+2 safety/equipment Ambulation Distance (Feet): 4 Feet Assistive device: Rolling  walker (2 wheeled) Gait Pattern/deviations:  (Hop-to)     General Gait Details: Took a few steps, largely keeping NWB RLE; Overall good control of RW, and good support with UEs on RW   Stairs            Wheelchair Mobility    Modified Rankin (Stroke Patients Only)       Balance     Sitting balance-Leahy Scale: Good       Standing balance-Leahy Scale: Poor Standing balance comment: requires bil. UE support and close guard; tried to be able to touch R heel to ground, but ultimately unable                            Cognition Arousal/Alertness: Awake/alert Behavior During Therapy: Anxious;Restless;Impulsive (but able to engage in conversation) Overall Cognitive Status: Impaired/Different from baseline                               Problem Solving: Slow processing;Requires verbal cues;Requires tactile cues General Comments: Overall appropriate with communication/conversation today; decr safety awareness as he states he will get up when he wants to by himself; Tangential in conversation at times; emotionally labile (cried that his kids have not visited)      Exercises      General Comments        Pertinent Vitals/Pain Pain Assessment: Faces Faces Pain Scale: Hurts little more Pain Location: R LE; Grimace noted with anticipation  of Pain Descriptors / Indicators: Grimacing;Guarding;Throbbing Pain Intervention(s): Monitored during session    Home Living                      Prior Function            PT Goals (current goals can now be found in the care plan section) Acute Rehab PT Goals Patient Stated Goal: Very much wants to be informed and involved in his care; Wants to walk PT Goal Formulation: Patient unable to participate in goal setting Time For Goal Achievement: 12/03/16 Potential to Achieve Goals: Good Progress towards PT goals: Goals met and updated - see care plan    Frequency    Min 3X/week      PT Plan  Current plan remains appropriate    Co-evaluation PT/OT/SLP Co-Evaluation/Treatment: Yes Reason for Co-Treatment: For patient/therapist safety PT goals addressed during session: Mobility/safety with mobility        AM-PAC PT "6 Clicks" Daily Activity  Outcome Measure  Difficulty turning over in bed (including adjusting bedclothes, sheets and blankets)?: None Difficulty moving from lying on back to sitting on the side of the bed? : None Difficulty sitting down on and standing up from a chair with arms (e.g., wheelchair, bedside commode, etc,.)?: Total Help needed moving to and from a bed to chair (including a wheelchair)?: A Little Help needed walking in hospital room?: A Lot Help needed climbing 3-5 steps with a railing? : Total 6 Click Score: 15    End of Session Equipment Utilized During Treatment: Gait belt Activity Tolerance: Patient limited by pain;Patient tolerated treatment well Patient left: in chair;with call bell/phone within reach;with chair alarm set Nurse Communication: Mobility status PT Visit Diagnosis: Other abnormalities of gait and mobility (R26.89);Pain Pain - Right/Left: Right Pain - part of body: Leg     Time: 7672-0947 PT Time Calculation (min) (ACUTE ONLY): 30 min  Charges:  $Gait Training: 8-22 mins                    G Codes:       Roney Marion, PT  Acute Rehabilitation Services Pager [phone] Office [place] 11/19/2016, 11:52 AM

## 2016-11-19 NOTE — Progress Notes (Signed)
[facility] Progress Note   Subjective:  Speech improved, less agitated today. No c/os Per wife going to rehab  Objective Vitals:   11/18/16 2121 11/18/16 2338 11/19/16 0218 11/19/16 0607  BP: (!) 195/82 (!) 197/86 (!) 166/77 (!) 166/78  Pulse: 73 74  74  Resp: 17 18  18   Temp: 98.3 F (36.8 C)   98.1 F (36.7 C)  TempSrc: Oral   Oral  SpO2: 98%   96%  Weight: 86 kg (189 lb 11.2 oz)     Height:       Physical Exam General: WNWD NAD Heart: RRR 3/6 systolic murmur  Lungs: CTAB Abdomen: soft NT/ND  Extremities: no LE edema R foot in ACE wrap  Dialysis Access: PD cath in place/ LUE AVF +bruit    Additional Objective  Recent Labs Lab 11/12/16 1530  11/17/16 0325 11/18/16 0301 11/19/16 0749  NA 133*  < > 135 134* 137  K 3.4*  < > 3.5 3.3* 3.0*  CL 97*  < > 97* 97* 98*  CO2 23  < > 26 27 27   GLUCOSE 75  < > 90 97 115*  BUN 58*  < > 43* 43* 42*  CREATININE 10.27*  < > 6.49* 6.65* 7.39*  CALCIUM 7.3*  < > 8.4* 8.3* 8.7*  PHOS 5.4*  --   --   --  4.5  < > = values in this interval not displayed.   Recent Labs Lab 11/12/16 1530 11/19/16 0749  ALBUMIN 1.5* 1.7*    Recent Labs Lab 11/13/16 0333 11/14/16 0230 11/15/16 0320 11/17/16 0325 11/19/16 0749  WBC 13.0* 15.1* 12.0* 12.3* 13.6*  HGB 8.5* 8.2* 8.1* 8.4* 9.3*  HCT 27.2* 26.5* 25.7* 28.2* 30.6*  MCV 81.0 81.8 83.4 82.7 84.1  PLT 524* 505* 494* 507* 599*       Component Value Date/Time   SDES FLUID PERITONEAL DIALYSIS 11/09/2016 0836   SDES FLUID PERITONEAL DIALYSIS 11/09/2016 0836   SPECREQUEST NONE 11/09/2016 0836   SPECREQUEST NONE 11/09/2016 0836   CULT NO GROWTH 5 DAYS 11/09/2016 0836   REPTSTATUS 11/14/2016 FINAL 11/09/2016 0836   REPTSTATUS 11/09/2016 FINAL 11/09/2016 0836    Recent Labs Lab 11/18/16 0801 11/18/16 1112 11/18/16 1658 11/18/16 2131 11/19/16 0821  GLUCAP 91 112* 110* 99 143*   Medications: . dialysis solution 1.5% low-MG/low-CA    . dialysis  solution 2.5% low-MG/low-CA    . valproate sodium Stopped (11/19/16 0124)   . amLODipine  10 mg Oral Daily  . aspirin  81 mg Oral Daily  . budesonide (PULMICORT) nebulizer solution  0.5 mg Nebulization BID  . calcitRIOL  0.5 mcg Oral Daily  . cinacalcet  60 mg Oral Q supper  . cloNIDine  0.2 mg Transdermal Weekly  . darbepoetin (ARANESP) injection - NON-DIALYSIS  200 mcg Subcutaneous Q Sun-1800  . docusate sodium  100 mg Oral Daily  . escitalopram  10 mg Oral Daily  . feeding supplement (NEPRO CARB STEADY)  237 mL Oral BID BM  . feeding supplement (PRO-STAT SUGAR FREE 64)  30 mL Oral BID  . ferric citrate  420 mg Oral TID WC  . gentamicin cream  1 application Topical Daily  . heparin  5,000 Units Subcutaneous Q8H  . hydrALAZINE  75 mg Oral Q8H  . insulin aspart  0-9 Units Subcutaneous TID WC  . isosorbide dinitrate  5 mg Oral BID  . labetalol  300 mg Oral BID  . LORazepam  1 mg Intravenous Q6H  .  mouth rinse  15 mL Mouth Rinse BID  . multivitamin  1 tablet Oral QHS  . pantoprazole  40 mg Oral Daily  . pravastatin  20 mg Oral Daily  . saccharomyces boulardii  250 mg Oral BID     Dialysis Orders:  [facility].  CCPD: 3L fill 5 exchanges at night with 5th fill manually drained 1-1/2 hours later. Uses mostly 1's and 2's at home.  Assessment/Plan: 1. Cellulitis, Ischemic R foot / sp R fem-pop bypass w great toe amp on 5/8 - s/p course of IV Vanc Blood cultures no growth-  per primary  2. AMS/delerium - psychosis w agitation - extensive psych history per wife - seems to be improving - meds per primary 3. ESRD - CCPD. Use all 2.5% dialysate again today. 4. Hypokalemia - oral K 40 po today - ordered - for K 3 5. 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 HTN/volume - BP improved / gradually adding back home meds  Continue to use all 2.5% dialysate  8. Nutrition - renal vit/protein supp for low album 9. Dispo - PT/OT  recommending CIR   Lynnda Child PA-C [facility] Pager [phone] 11/19/2016,9:34 AM   I have seen and examined this patient and agree with plan and assessment in the above note with renal recommendations/intervention highlighted. Continue with all 2.5% dialysate. Replete K po 40 today  Verenice Westrich B,MD 11/19/2016 2:31 PM

## 2016-11-19 NOTE — Care Management Important Message (Signed)
Important Message  Patient Details  Name: Hayden [name] MRN: [id number] Date of Birth: [birth year]   Medicare Important Message Given:  Yes    Earlyne Feeser 11/19/2016, 12:12 PM

## 2016-11-19 NOTE — Progress Notes (Signed)
Continuing to follow.  I do not have a bed to offer this patient today.    [facility] Admissions Coordinator Cell [phone] Office [phone]

## 2016-11-19 NOTE — Plan of Care (Signed)
Problem: Skin Integrity: Goal: Risk for impaired skin integrity will decrease Outcome: Progressing Guaze dressing replaced to R groin. Pt refused to have R upper thigh dressing changed. Stated we'll do it tomorrow. Will continue to monitor. Bartholomew Crews, RN

## 2016-11-19 NOTE — Progress Notes (Signed)
Pt requested to speak with "psychotherapist."  RN will let dayshift RN know.

## 2016-11-19 NOTE — Progress Notes (Signed)
Occupational Therapy Treatment Patient Details Name: Hayden [name] MRN: [id number] DOB: 12-07-1970 Today's Date: 11/19/2016    History of present illness Pt is a 46 y.o. M  admitted 4/25 for dry gangrene and cellulitis on the right foot./Right great toe,  and is now s/p  right CFA to below knee popliteal artery bypass, right first toe amputation, right dorsal ulcer debridement performed on 5/8.  He was extubated morning of 5/9. Pt with post-op encephalopathy and agitation. PMH - ESRD on peritoneal dialysis, HTN, CVA, kidney CA.   OT comments  Pt completed LB dressing this session and needed redirection to task constantly due to easily distracted by internal thoughts. Pt liable during session due to kids not visiting. Pt verbalized "i love my wife  But we dont get along a lot. " Pt in chair with high fall risk and self reports "if I want to do more then I will do more even if you dont want me to".   Follow Up Recommendations  CIR;Supervision/Assistance - 24 hour    Equipment Recommendations  None recommended by OT    Recommendations for Other Services      Precautions / Restrictions Precautions Precautions: Fall Precaution Comments: Pt confused and delusional at times  Restrictions Weight Bearing Restrictions: No       Mobility Bed Mobility               General bed mobility comments: in chair on arrival  Transfers Overall transfer level: Needs assistance Equipment used: Rolling walker (2 wheeled) Transfers: Sit to/from Stand Sit to Stand: Mod assist;+2 safety/equipment Stand pivot transfers: Min guard;+2 physical assistance       General transfer comment: Cues for hand placement and safety; Light mod assist to power up and steady    Balance Overall balance assessment: Needs assistance Sitting-balance support: Feet supported Sitting balance-Leahy Scale: Good Sitting balance - Comments: requires UE support    Standing balance support: During functional  activity Standing balance-Leahy Scale: Poor Standing balance comment: requires bil. UE support and close guard; tried to be able to touch R heel to ground, but ultimately unable                           ADL either performed or assessed with clinical judgement   ADL Overall ADL's : Needs assistance/impaired Eating/Feeding: Set up;Sitting   Grooming: Wash/dry hands;Wash/dry face;Oral care;Set up;Sitting               Lower Body Dressing: Moderate assistance Lower Body Dressing Details (indicate cue type and reason): pt able to don L sock and total (A) fo rR sock. Pt reluctant to put on sock initially    Toilet Transfer Details (indicate cue type and reason): declined at this time. reports no needs         Functional mobility during ADLs: Minimal assistance;+2 for physical assistance;Rolling walker General ADL Comments: Pt internally distracted and needed return to sitting. Pt verbalized no desire to proceed at [facility] stime.     Vision       Perception     Praxis      Cognition Arousal/Alertness: Awake/alert Behavior During Therapy: Anxious;Restless;Impulsive (but able to engage in conversation) Overall Cognitive Status: Impaired/Different from baseline Area of Impairment: Orientation;Attention;Memory;Following commands;Safety/judgement;Awareness;Problem solving                 Orientation Level: Disoriented to;Time;Place;Situation Current Attention Level: Sustained Memory: Decreased short-term memory Following Commands: Follows one step  commands consistently Safety/Judgement: Decreased awareness of safety;Decreased awareness of deficits Awareness: Intellectual Problem Solving: Slow processing;Requires verbal cues;Requires tactile cues General Comments: Overall appropriate with communication/conversation today; decr safety awareness as he states he will get up when he wants to by himself; Tangential in conversation at times; emotionally labile (cried  that his kids have not visited) pt reports "are you the ones i have been giving a hard time today" pt unaware that therapist are new to his day        Exercises General Exercises - Lower Extremity Quad Sets:  (Difficulty straightening knee due to pain)   Shoulder Instructions       General Comments      Pertinent Vitals/ Pain       Pain Assessment: Faces Faces Pain Scale: Hurts little more Pain Location: R LE; Grimace noted with anticipation of Pain Descriptors / Indicators: Grimacing;Guarding;Throbbing Pain Intervention(s): Monitored during session;Premedicated before session;Repositioned;Limited activity within patient's tolerance  Home Living                                          Prior Functioning/Environment              Frequency  Min 3X/week        Progress Toward Goals  OT Goals(current goals can now be found in the care plan section)  Progress towards OT goals: Progressing toward goals  Acute Rehab OT Goals Patient Stated Goal: Very much wants to be informed and involved in his care; Wants to walk OT Goal Formulation: With patient Time For Goal Achievement: 11/22/16 Potential to Achieve Goals: Good ADL Goals Pt Will Perform Grooming: with supervision;sitting (x3 tasks) Pt Will Perform Upper Body Bathing: with supervision;sitting Pt Will Perform Lower Body Bathing: with min guard assist;sit to/from stand Pt Will Transfer to Toilet: with min assist;ambulating;bedside commode (over toilet)  Plan Discharge plan remains appropriate    Co-evaluation    PT/OT/SLP Co-Evaluation/Treatment: Yes Reason for Co-Treatment: Complexity of the patient's impairments (multi-system involvement);Necessary to address cognition/behavior during functional activity;For patient/therapist safety;To address functional/ADL transfers   OT goals addressed during session: ADL's and self-care;Proper use of Adaptive equipment and DME;Strengthening/ROM       AM-PAC PT "6 Clicks" Daily Activity     Outcome Measure   Help from another person eating meals?: A Little Help from another person taking care of personal grooming?: A Little Help from another person toileting, which includes using toliet, bedpan, or urinal?: A Lot Help from another person bathing (including washing, rinsing, drying)?: A Lot Help from another person to put on and taking off regular upper body clothing?: A Lot Help from another person to put on and taking off regular lower body clothing?: A Lot 6 Click Score: 14    End of Session Equipment Utilized During Treatment: Rolling walker  OT Visit Diagnosis: Pain;Cognitive communication deficit (R41.841) Pain - Right/Left: Right Pain - part of body: Leg   Activity Tolerance Patient tolerated treatment well   Patient Left in chair;with call bell/phone within reach;with family/visitor present;with chair alarm set   Nurse Communication Mobility status;Patient requests pain meds        Time: 8338-2505 OT Time Calculation (min): 30 min  Charges: OT General Charges $OT Visit: 1 Procedure OT Treatments $Therapeutic Activity: 8-22 mins   Jeri Modena   OTR/L Pager: [phone] Office: [phone] .    Parke Poisson B 11/19/2016, 3:29  PM

## 2016-11-19 NOTE — Progress Notes (Signed)
Nutrition Follow-up  DOCUMENTATION CODES:   Not applicable  INTERVENTION:   -D/c Nepro Shake po BID, each supplement provides 425 kcal and 19 grams protein -Continue 30 ml Prostat BID, each supplement provides 100 kcals and 15 grams protein -Continue daily renal MVI  NUTRITION DIAGNOSIS:   Increased nutrient needs related to wound healing as evidenced by estimated needs.  Progressing  GOAL:   Patient will meet greater than or equal to 90% of their needs  Progressing  MONITOR:   PO intake, Supplement acceptance, Labs, Weight trends, Skin, I & O's  REASON FOR ASSESSMENT:   Consult Enteral/tube feeding initiation and management  ASSESSMENT:   Pt with hx of ESRD on CPPD (1.5% solution) admitted for gangrene and cellulitis of the right foot/great toe s/p OR today- R fem-pop bypass with R great toe amputation.  5/15- HD initiated (due to potential underdialysis on PD and for hopeful improvement with confusion)  Per chart review, pt remains confused. Pt sleeping soundly at time of visit. RD did not disturb.   Reviewed wt since admission; noted wt loss since admission, however, weights have been stable since initiation of HD. Wt changes are likely related to fluid changes from HD.   Meal completion has improved. Noted 90-100% meal completion. Per MAR, pt has been refusing Nepro supplements. Nephrology service ordered renal MVI and Prostat supplements, which pt is taking. Agree with addition of these supplements related to increased nutritional needs and improved oral intake.   Plan to d/c to CIR once medically stable.   Labs reviewed: K: 3.0, CBGS: 99-143.   Diet Order:  Diet renal with fluid restriction Fluid restriction: 1200 mL Fluid; Room service appropriate? Yes; Fluid consistency: Thin  Skin:  Wound (see comment) (Deep tissue injury R foot, unstageable R foot, incisions)  Last BM:  11/18/16  Height:   Ht Readings from Last 1 Encounters:  10/25/16 5\' 10"  (1.778  m)    Weight:   Wt Readings from Last 1 Encounters:  11/18/16 189 lb 11.2 oz (86 kg)    Ideal Body Weight:  75.4 kg  BMI:  Body mass index is 27.22 kg/m.  Estimated Nutritional Needs:   Kcal:  2200-2400  Protein:  100-120 grams  Fluid:  1.2 L/day  EDUCATION NEEDS:   No education needs identified at this time  Tarvis Blossom A. Jimmye Norman, RD, LDN, CDE Pager: [phone] After hours Pager: [phone]

## 2016-11-19 NOTE — Progress Notes (Signed)
[facility]  PROGRESS NOTE  CAYSON KALB BLT:[id number] DOB: [birth year] DOA: 10/23/2016 PCP: Corliss Parish, MD   Brief HPI:   46 y/o male with ESRD on PD, admitted on 4/25 with cellulitis and gangrene of right great toe. He was also noted to be encephalopathic. He was seen by vascular surgery and underwent right great toe amputation and right common femoral artery to below the knee popliteal artery bypass. He is continued on vancomycin. He is undergoing PD per nephrology. Hospital course has been complicated by encephalopathy and worsening delirium, needing precedex infusion. Since being started on precedex, his mental status has started to improve. He has been weaned off precedex and has been started on xanax. Will need physical therapy and possible placement. PT evaluation recommended CIR, inpatient rehabilitation consulted.  [facility] assumed care 5/13    Subjective   Patient's mental status slowly improving. Still having intermittent episodes of confusion. Wants to see  psychiatrist.    Assessment/Plan:     1. Cellulitis, gangrene of right big toe-status post resection of right big toe  11/05/2016. Patient was started on IV vancomycin. Culture showed no growth. Vascular surgery has discontinued antibiotics.Wound care following.  2. Acute encephalopathy/delirium-Mental status is slowly improving,after cutting down Wellbutrin to 150 mg by mouth daily and restarting Lexapro 10 mg daily  patient's hospital course has been complicated with delirium he has been seen by neurology and psychiatry during this admission. It was felt that patients encephalopathy could be from withdrawal of medication/alcohol. Antipsychotics were not recommended due to prolonged QTc and he was started on Depakote and lorazepam. After surgery patient's delirium worsened and he was transferred to ICU was placed on Precedex infusion. Currently he is off Precedex and on Xanax, Wellbutrin and Depakote. Ammonia  level is 30. continue Wellbutrin 150 mg by mouth daily, Lexapro 10 mg by mouth daily. Continue current treatment.[facility] consult psychiatry today for adjustment of his medications. Will change Ativan to 1 mg by mouth every 6 hours when necessary for anxiety/sleep. Will change IV Depakote to by mouth.  3. Peripheral vascular disease- status post right common femoral artery to below the knee popliteal artery bypass. Vascular surgery following. No new recommendations.  4. Hypertensive urgency-blood pressure is improved  after increasing the dose of  hydralazine . Continue labetalol, isosorbide, clonidine patch, amlodipine, will change the dose of hydralazine to 75 mg by mouth 3 times a day.  5. ESRD-patient was  started on hemodialysis per nephrology. Now back on peritoneal dialysis.  6. Constipation- resolved patient was started on lactulose 20 g 3 times a day for 3 doses. Ammonia level is 30.  7. Anemia of chronic disease- hemoglobin and hematocrit currently stable  8. Hyperlipidemia- continue Pravachol  9. Diabetes mellitus type 2- blood glucose is stable, continue sliding scale insulin with NovoLog. Last 3 blood glucose as follows 115, 143, 99.      DVT prophylaxis: Heparin  Code Status: Full code  Family Communication: Discussed with patient's wife at bedside   Disposition Plan: Inpatient rehabilitation when bed becomes available   Consultants:  Nephrology  Vascular surgery  [facility] CM  Psychiatry  Cardiology  CIR  Procedures: 4/25 Admit  5/07 Seen by Neurology &Psychiatry  5/08 To OR for right femoropopliteal vs femorotibial bypass &right great toe amputation  5/09 Extubated 5/11 Off precedex  5/13 [facility] assumed care   Continuous infusions . dialysis solution 1.5% low-MG/low-CA    . dialysis solution 2.5% low-MG/low-CA    . valproate sodium 500 mg (11/19/16  1142)      Antibiotics:   Anti-infectives    Start     Dose/Rate Route Frequency Ordered Stop    11/13/16 1730  vancomycin (VANCOCIN) IVPB 1000 mg/200 mL premix     1,000 mg 200 mL/hr over 60 Minutes Intravenous  Once 11/13/16 1623 11/13/16 1830   11/12/16 1900  vancomycin (VANCOCIN) IVPB 1000 mg/200 mL premix  Status:  Discontinued     1,000 mg 200 mL/hr over 60 Minutes Intravenous To Hemodialysis 11/12/16 1648 11/13/16 1623   11/09/16 1000  vancomycin (VANCOCIN) 500 mg in sodium chloride 0.9 % 100 mL IVPB  Status:  Discontinued     500 mg 100 mL/hr over 60 Minutes Intravenous Once every 5 days 11/04/16 1326 11/12/16 1648   11/05/16 0800  piperacillin-tazobactam (ZOSYN) IVPB 2.25 g  Status:  Discontinued     2.25 g 100 mL/hr over 30 Minutes Intravenous To Surgery 11/05/16 0747 11/05/16 1413   11/04/16 2200  piperacillin-tazobactam (ZOSYN) IVPB 2.25 g  Status:  Discontinued     2.25 g 100 mL/hr over 30 Minutes Intravenous Every 12 hours 11/04/16 1507 11/07/16 1011   11/04/16 1400  vancomycin (VANCOCIN) 500 mg in sodium chloride 0.9 % 100 mL IVPB     500 mg 100 mL/hr over 60 Minutes Intravenous  Once 11/04/16 1317 11/04/16 1615   10/31/16 2000  piperacillin-tazobactam (ZOSYN) IVPB 3.375 g  Status:  Discontinued     3.375 g 12.5 mL/hr over 240 Minutes Intravenous Every 12 hours 10/31/16 0842 11/04/16 1507   10/30/16 1400  vancomycin (VANCOCIN) 500 mg in sodium chloride 0.9 % 100 mL IVPB  Status:  Discontinued     500 mg 100 mL/hr over 60 Minutes Intravenous Once every 6 days 10/30/16 1306 11/04/16 1317   10/30/16 1000  vancomycin (VANCOCIN) 500 mg in sodium chloride 0.9 % 100 mL IVPB  Status:  Discontinued     500 mg 100 mL/hr over 60 Minutes Intravenous Once every 6 days 10/29/16 1114 10/30/16 1306   10/24/16 0600  piperacillin-tazobactam (ZOSYN) IVPB 3.375 g  Status:  Discontinued     3.375 g 12.5 mL/hr over 240 Minutes Intravenous Every 12 hours 10/23/16 1854 10/31/16 0842   10/23/16 1845  piperacillin-tazobactam (ZOSYN) IVPB 3.375 g     3.375 g 100 mL/hr over 30 Minutes  Intravenous  Once 10/23/16 1837 10/23/16 1928   10/23/16 1845  vancomycin (VANCOCIN) 2,000 mg in sodium chloride 0.9 % 500 mL IVPB     2,000 mg 250 mL/hr over 120 Minutes Intravenous  Once 10/23/16 1842 10/23/16 2131       Objective   Vitals:   11/18/16 2338 11/19/16 0218 11/19/16 0607 11/19/16 0900  BP: (!) 197/86 (!) 166/77 (!) 166/78 (!) 157/73  Pulse: 74  74 73  Resp: 18  18 16   Temp:   98.1 F (36.7 C) 97.6 F (36.4 C)  TempSrc:   Oral Axillary  SpO2:   96% 99%  Weight:      Height:        Intake/Output Summary (Last 24 hours) at 11/19/16 1444 Last data filed at 11/19/16 1015  Gross per 24 hour  Intake            11178 ml  Output            11231 ml  Net              -53 ml   Filed Weights   11/18/16 0819 11/18/16 1200 11/18/16 2121  Weight: 85.2 kg (187 lb 13.3 oz) 67.8 kg (149 lb 6.4 oz) 86 kg (189 lb 11.2 oz)     Physical Examination:    Physical Exam: Eyes: No icterus, extraocular muscles intact  Mouth: Oral mucosa is moist, no lesions on palate,  Neck: Supple, no deformities, masses, or tenderness Lungs: Normal respiratory effort, bilateral clear to auscultation, no crackles or wheezes.  Heart: Regular rate and rhythm, S1 and S2 normal, no murmurs, rubs auscultated Abdomen: BS normoactive,soft,nondistended,non-tender to palpation,no organomegaly Extremities: No pretibial edema, no erythema, no cyanosis, no clubbing Neuro : Alert and oriented to time, place and person, No focal deficits Skin: No rashes seen on exam    Data Reviewed: I have personally reviewed following labs and imaging studies  CBG:  Recent Labs Lab 11/18/16 1112 11/18/16 1658 11/18/16 2131 11/19/16 0821 11/19/16 1238  GLUCAP 112* 110* 99 143* 115*    CBC:  Recent Labs Lab 11/13/16 0333 11/14/16 0230 11/15/16 0320 11/17/16 0325 11/19/16 0749  WBC 13.0* 15.1* 12.0* 12.3* 13.6*  HGB 8.5* 8.2* 8.1* 8.4* 9.3*  HCT 27.2* 26.5* 25.7* 28.2* 30.6*  MCV 81.0 81.8 83.4  82.7 84.1  PLT 524* 505* 494* 507* 599*    Basic Metabolic Panel:  Recent Labs Lab 11/12/16 1530  11/14/16 0230 11/15/16 0320 11/17/16 0325 11/18/16 0301 11/19/16 0749  NA 133*  < > 134* 135 135 134* 137  K 3.4*  < > 3.6 3.8 3.5 3.3* 3.0*  CL 97*  < > 97* 98* 97* 97* 98*  CO2 23  < > 27 27 26 27 27   GLUCOSE 75  < > 83 64* 90 97 115*  BUN 58*  < > 30* 25* 43* 43* 42*  CREATININE 10.27*  < > 6.37* 5.12* 6.49* 6.65* 7.39*  CALCIUM 7.3*  < > 7.7* 7.8* 8.4* 8.3* 8.7*  PHOS 5.4*  --   --   --   --   --  4.5  < > = values in this interval not displayed.  No results found for this or any previous visit (from the past 240 hour(s)).   Liver Function Tests:  Recent Labs Lab 11/12/16 1530 11/19/16 0749  ALBUMIN 1.5* 1.7*   No results for input(s): LIPASE, AMYLASE in the last 168 hours.  Recent Labs Lab 11/13/16 0920  AMMONIA 30    No   Studies: No results found.  Scheduled Meds: . amLODipine  10 mg Oral Daily  . aspirin  81 mg Oral Daily  . budesonide (PULMICORT) nebulizer solution  0.5 mg Nebulization BID  . calcitRIOL  0.5 mcg Oral Daily  . cinacalcet  60 mg Oral Q supper  . cloNIDine  0.2 mg Transdermal Weekly  . darbepoetin (ARANESP) injection - NON-DIALYSIS  200 mcg Subcutaneous Q Sun-1800  . docusate sodium  100 mg Oral Daily  . escitalopram  10 mg Oral Daily  . feeding supplement (NEPRO CARB STEADY)  237 mL Oral BID BM  . feeding supplement (PRO-STAT SUGAR FREE 64)  30 mL Oral BID  . ferric citrate  420 mg Oral TID WC  . gentamicin cream  1 application Topical Daily  . heparin  5,000 Units Subcutaneous Q8H  . hydrALAZINE  75 mg Oral Q8H  . insulin aspart  0-9 Units Subcutaneous TID WC  . isosorbide dinitrate  5 mg Oral BID  . labetalol  300 mg Oral BID  . LORazepam  1 mg Intravenous Q6H  . mouth rinse  15 mL Mouth Rinse BID  .  multivitamin  1 tablet Oral QHS  . pantoprazole  40 mg Oral Daily  . potassium chloride  40 mEq Oral Once  . pravastatin  20  mg Oral Daily  . saccharomyces boulardii  250 mg Oral BID      Time spent: 25 min  [facility] Pager [phone]. If 7PM-7AM, please contact night-coverage at [email or web address], Office  [phone]  password [email or web address] 11/19/2016, 2:44 PM  LOS: 26 days

## 2016-11-20 ENCOUNTER — Inpatient Hospital Stay (HOSPITAL_COMMUNITY)
Admission: RE | Admit: 2016-11-20 | Discharge: 2016-11-28 | DRG: 559 | Disposition: A | Discharge status: Home Health | Payer: Medicare Other | Source: Intra-hospital | Attending: Physical Medicine & Rehabilitation | Admitting: Physical Medicine & Rehabilitation

## 2016-11-20 ENCOUNTER — Encounter (HOSPITAL_COMMUNITY): Payer: Self-pay

## 2016-11-20 DIAGNOSIS — Z8673 Personal history of transient ischemic attack (TIA), and cerebral infarction without residual deficits: Secondary | ICD-10-CM | POA: Diagnosis not present

## 2016-11-20 DIAGNOSIS — F17210 Nicotine dependence, cigarettes, uncomplicated: Secondary | ICD-10-CM | POA: Diagnosis present

## 2016-11-20 DIAGNOSIS — Z79899 Other long term (current) drug therapy: Secondary | ICD-10-CM

## 2016-11-20 DIAGNOSIS — F29 Unspecified psychosis not due to a substance or known physiological condition: Secondary | ICD-10-CM | POA: Diagnosis present

## 2016-11-20 DIAGNOSIS — E876 Hypokalemia: Secondary | ICD-10-CM | POA: Diagnosis present

## 2016-11-20 DIAGNOSIS — Z89411 Acquired absence of right great toe: Secondary | ICD-10-CM | POA: Diagnosis not present

## 2016-11-20 DIAGNOSIS — Z9049 Acquired absence of other specified parts of digestive tract: Secondary | ICD-10-CM | POA: Diagnosis not present

## 2016-11-20 DIAGNOSIS — I169 Hypertensive crisis, unspecified: Secondary | ICD-10-CM | POA: Diagnosis present

## 2016-11-20 DIAGNOSIS — Z905 Acquired absence of kidney: Secondary | ICD-10-CM | POA: Diagnosis not present

## 2016-11-20 DIAGNOSIS — Z885 Allergy status to narcotic agent status: Secondary | ICD-10-CM | POA: Diagnosis not present

## 2016-11-20 DIAGNOSIS — Z8249 Family history of ischemic heart disease and other diseases of the circulatory system: Secondary | ICD-10-CM | POA: Diagnosis not present

## 2016-11-20 DIAGNOSIS — E119 Type 2 diabetes mellitus without complications: Secondary | ICD-10-CM

## 2016-11-20 DIAGNOSIS — E1122 Type 2 diabetes mellitus with diabetic chronic kidney disease: Secondary | ICD-10-CM | POA: Diagnosis present

## 2016-11-20 DIAGNOSIS — D638 Anemia in other chronic diseases classified elsewhere: Secondary | ICD-10-CM

## 2016-11-20 DIAGNOSIS — D649 Anemia, unspecified: Secondary | ICD-10-CM | POA: Diagnosis present

## 2016-11-20 DIAGNOSIS — D72829 Elevated white blood cell count, unspecified: Secondary | ICD-10-CM | POA: Diagnosis present

## 2016-11-20 DIAGNOSIS — M792 Neuralgia and neuritis, unspecified: Secondary | ICD-10-CM

## 2016-11-20 DIAGNOSIS — R0989 Other specified symptoms and signs involving the circulatory and respiratory systems: Secondary | ICD-10-CM

## 2016-11-20 DIAGNOSIS — F329 Major depressive disorder, single episode, unspecified: Secondary | ICD-10-CM | POA: Diagnosis present

## 2016-11-20 DIAGNOSIS — I739 Peripheral vascular disease, unspecified: Secondary | ICD-10-CM | POA: Diagnosis not present

## 2016-11-20 DIAGNOSIS — Z6826 Body mass index (BMI) 26.0-26.9, adult: Secondary | ICD-10-CM | POA: Diagnosis not present

## 2016-11-20 DIAGNOSIS — I5042 Chronic combined systolic (congestive) and diastolic (congestive) heart failure: Secondary | ICD-10-CM | POA: Diagnosis present

## 2016-11-20 DIAGNOSIS — D62 Acute posthemorrhagic anemia: Secondary | ICD-10-CM

## 2016-11-20 DIAGNOSIS — N186 End stage renal disease: Secondary | ICD-10-CM | POA: Diagnosis present

## 2016-11-20 DIAGNOSIS — Z7982 Long term (current) use of aspirin: Secondary | ICD-10-CM

## 2016-11-20 DIAGNOSIS — Z85528 Personal history of other malignant neoplasm of kidney: Secondary | ICD-10-CM | POA: Diagnosis not present

## 2016-11-20 DIAGNOSIS — Z4781 Encounter for orthopedic aftercare following surgical amputation: Principal | ICD-10-CM

## 2016-11-20 DIAGNOSIS — R4189 Other symptoms and signs involving cognitive functions and awareness: Secondary | ICD-10-CM | POA: Diagnosis present

## 2016-11-20 DIAGNOSIS — E1152 Type 2 diabetes mellitus with diabetic peripheral angiopathy with gangrene: Secondary | ICD-10-CM

## 2016-11-20 DIAGNOSIS — I96 Gangrene, not elsewhere classified: Secondary | ICD-10-CM | POA: Diagnosis present

## 2016-11-20 DIAGNOSIS — R5381 Other malaise: Secondary | ICD-10-CM

## 2016-11-20 DIAGNOSIS — G9340 Encephalopathy, unspecified: Secondary | ICD-10-CM | POA: Diagnosis present

## 2016-11-20 DIAGNOSIS — I132 Hypertensive heart and chronic kidney disease with heart failure and with stage 5 chronic kidney disease, or end stage renal disease: Secondary | ICD-10-CM | POA: Diagnosis present

## 2016-11-20 DIAGNOSIS — Z95828 Presence of other vascular implants and grafts: Secondary | ICD-10-CM

## 2016-11-20 DIAGNOSIS — Z992 Dependence on renal dialysis: Secondary | ICD-10-CM

## 2016-11-20 DIAGNOSIS — M79671 Pain in right foot: Secondary | ICD-10-CM | POA: Diagnosis present

## 2016-11-20 DIAGNOSIS — Z716 Tobacco abuse counseling: Secondary | ICD-10-CM

## 2016-11-20 LAB — RENAL FUNCTION PANEL
ALBUMIN: 1.7 g/dL — AB (ref 3.5–5.0)
Anion gap: 12 (ref 5–15)
BUN: 47 mg/dL — AB (ref 6–20)
CO2: 27 mmol/L (ref 22–32)
CREATININE: 7.65 mg/dL — AB (ref 0.61–1.24)
Calcium: 9 mg/dL (ref 8.9–10.3)
Chloride: 99 mmol/L — ABNORMAL LOW (ref 101–111)
GFR calc Af Amer: 9 mL/min — ABNORMAL LOW (ref >60)
GFR calc non Af Amer: 8 mL/min — ABNORMAL LOW (ref >60)
Glucose, Bld: 109 mg/dL — ABNORMAL HIGH (ref 65–99)
PHOSPHORUS: 5 mg/dL — AB (ref 2.5–4.6)
Potassium: 3.3 mmol/L — ABNORMAL LOW (ref 3.5–5.1)
Sodium: 138 mmol/L (ref 135–145)

## 2016-11-20 LAB — CBC
HEMATOCRIT: 30.6 % — AB (ref 39.0–52.0)
HEMOGLOBIN: 9.4 g/dL — AB (ref 13.0–17.0)
MCH: 26.3 pg (ref 26.0–34.0)
MCHC: 30.7 g/dL (ref 30.0–36.0)
MCV: 85.5 fL (ref 78.0–100.0)
Platelets: 666 10*3/uL — ABNORMAL HIGH (ref 150–400)
RBC: 3.58 MIL/uL — AB (ref 4.22–5.81)
RDW: 22.6 % — ABNORMAL HIGH (ref 11.5–15.5)
WBC: 17 10*3/uL — ABNORMAL HIGH (ref 4.0–10.5)

## 2016-11-20 LAB — GLUCOSE, CAPILLARY
GLUCOSE-CAPILLARY: 140 mg/dL — AB (ref 65–99)
GLUCOSE-CAPILLARY: 104 mg/dL — AB (ref 65–99)
Glucose-Capillary: 113 mg/dL — ABNORMAL HIGH (ref 65–99)
Glucose-Capillary: 120 mg/dL — ABNORMAL HIGH (ref 65–99)

## 2016-11-20 MED ORDER — PANTOPRAZOLE SODIUM 40 MG PO TBEC
40.0000 mg | DELAYED_RELEASE_TABLET | Freq: Every day | ORAL | Status: DC
  Administered 2016-11-21 – 2016-11-28 (×8): 40 mg via ORAL
  Filled 2016-11-20 (×8): qty 1

## 2016-11-20 MED ORDER — DELFLEX-LC/2.5% DEXTROSE 394 MOSM/L IP SOLN
INTRAPERITONEAL | Status: DC
  Administered 2016-11-21 – 2016-11-24 (×2): via INTRAPERITONEAL

## 2016-11-20 MED ORDER — CLONIDINE HCL 0.2 MG/24HR TD PTWK
0.2000 mg | MEDICATED_PATCH | TRANSDERMAL | Status: DC
  Administered 2016-11-21 – 2016-11-28 (×2): 0.2 mg via TRANSDERMAL
  Filled 2016-11-20 (×2): qty 1

## 2016-11-20 MED ORDER — ISOSORBIDE DINITRATE 5 MG PO TABS
5.0000 mg | ORAL_TABLET | Freq: Two times a day (BID) | ORAL | Status: DC
  Administered 2016-11-20 – 2016-11-28 (×16): 5 mg via ORAL
  Filled 2016-11-20 (×16): qty 1

## 2016-11-20 MED ORDER — SACCHAROMYCES BOULARDII 250 MG PO CAPS
250.0000 mg | ORAL_CAPSULE | Freq: Two times a day (BID) | ORAL | Status: DC
  Administered 2016-11-20 – 2016-11-28 (×15): 250 mg via ORAL
  Filled 2016-11-20 (×16): qty 1

## 2016-11-20 MED ORDER — ASPIRIN 81 MG PO CHEW
81.0000 mg | CHEWABLE_TABLET | Freq: Every day | ORAL | Status: DC
  Administered 2016-11-21 – 2016-11-28 (×8): 81 mg via ORAL
  Filled 2016-11-20 (×8): qty 1

## 2016-11-20 MED ORDER — HYDROCODONE-ACETAMINOPHEN 5-325 MG PO TABS
1.0000 | ORAL_TABLET | Freq: Four times a day (QID) | ORAL | Status: DC | PRN
  Administered 2016-11-21 – 2016-11-26 (×15): 2 via ORAL
  Administered 2016-11-26: 1 via ORAL
  Administered 2016-11-26 – 2016-11-28 (×8): 2 via ORAL
  Filled 2016-11-20 (×9): qty 2
  Filled 2016-11-20: qty 1
  Filled 2016-11-20 (×15): qty 2

## 2016-11-20 MED ORDER — DIVALPROEX SODIUM 125 MG PO CSDR
500.0000 mg | DELAYED_RELEASE_CAPSULE | Freq: Two times a day (BID) | ORAL | Status: DC
  Administered 2016-11-20 – 2016-11-28 (×15): 500 mg via ORAL
  Filled 2016-11-20 (×16): qty 4

## 2016-11-20 MED ORDER — PRAVASTATIN SODIUM 20 MG PO TABS
20.0000 mg | ORAL_TABLET | Freq: Every day | ORAL | Status: DC
  Administered 2016-11-21 – 2016-11-28 (×8): 20 mg via ORAL
  Filled 2016-11-20 (×8): qty 1

## 2016-11-20 MED ORDER — DOCUSATE SODIUM 100 MG PO CAPS
100.0000 mg | ORAL_CAPSULE | Freq: Every day | ORAL | Status: DC
Start: 2016-11-21 — End: 2016-11-28
  Administered 2016-11-21 – 2016-11-28 (×7): 100 mg via ORAL
  Filled 2016-11-20 (×8): qty 1

## 2016-11-20 MED ORDER — DELFLEX-LC/1.5% DEXTROSE 344 MOSM/L IP SOLN: INTRAPERITONEAL | Status: DC

## 2016-11-20 MED ORDER — ESCITALOPRAM OXALATE 10 MG PO TABS
10.0000 mg | ORAL_TABLET | Freq: Every day | ORAL | Status: DC
  Administered 2016-11-21 – 2016-11-28 (×8): 10 mg via ORAL
  Filled 2016-11-20 (×8): qty 1

## 2016-11-20 MED ORDER — AMLODIPINE BESYLATE 10 MG PO TABS
10.0000 mg | ORAL_TABLET | Freq: Every day | ORAL | Status: DC
  Administered 2016-11-21 – 2016-11-28 (×8): 10 mg via ORAL
  Filled 2016-11-20 (×8): qty 1

## 2016-11-20 MED ORDER — HEPARIN 1000 UNIT/ML FOR PERITONEAL DIALYSIS
INTRAPERITONEAL | Status: DC | PRN
  Filled 2016-11-20: qty 5000

## 2016-11-20 MED ORDER — PRO-STAT SUGAR FREE PO LIQD
30.0000 mL | Freq: Two times a day (BID) | ORAL | Status: DC
  Administered 2016-11-21 – 2016-11-22 (×2): 30 mL via ORAL
  Filled 2016-11-20 (×4): qty 30

## 2016-11-20 MED ORDER — HEPARIN SODIUM (PORCINE) 5000 UNIT/ML IJ SOLN: 5000.0000 [IU] | Freq: Three times a day (TID) | INTRAMUSCULAR | Status: DC

## 2016-11-20 MED ORDER — RENA-VITE PO TABS
1.0000 | ORAL_TABLET | Freq: Every day | ORAL | Status: DC
Start: 2016-11-20 — End: 2016-11-28
  Administered 2016-11-20 – 2016-11-27 (×4): 1 via ORAL
  Filled 2016-11-20 (×6): qty 1

## 2016-11-20 MED ORDER — FERRIC CITRATE 1 GM 210 MG(FE) PO TABS
420.0000 mg | ORAL_TABLET | Freq: Three times a day (TID) | ORAL | Status: DC
  Administered 2016-11-20 – 2016-11-28 (×24): 420 mg via ORAL
  Filled 2016-11-20 (×25): qty 2

## 2016-11-20 MED ORDER — CINACALCET HCL 30 MG PO TABS
60.0000 mg | ORAL_TABLET | Freq: Every day | ORAL | Status: DC
  Administered 2016-11-20 – 2016-11-27 (×8): 60 mg via ORAL
  Filled 2016-11-20 (×8): qty 2

## 2016-11-20 MED ORDER — GI COCKTAIL ~~LOC~~: 30.0000 mL | Freq: Three times a day (TID) | ORAL | Status: DC | PRN

## 2016-11-20 MED ORDER — ACETAMINOPHEN 325 MG PO TABS
650.0000 mg | ORAL_TABLET | Freq: Four times a day (QID) | ORAL | Status: DC | PRN
  Administered 2016-11-21: 650 mg via ORAL
  Filled 2016-11-20: qty 2

## 2016-11-20 MED ORDER — INSULIN ASPART 100 UNIT/ML ~~LOC~~ SOLN: 0.0000 [IU] | Freq: Three times a day (TID) | SUBCUTANEOUS | Status: DC

## 2016-11-20 MED ORDER — DARBEPOETIN ALFA 200 MCG/0.4ML IJ SOSY
200.0000 ug | PREFILLED_SYRINGE | INTRAMUSCULAR | Status: DC
  Administered 2016-11-24: 200 ug via SUBCUTANEOUS
  Filled 2016-11-20: qty 0.4

## 2016-11-20 MED ORDER — GERHARDT'S BUTT CREAM: TOPICAL_CREAM | CUTANEOUS | Status: DC | PRN

## 2016-11-20 MED ORDER — LORAZEPAM 0.5 MG PO TABS
1.0000 mg | ORAL_TABLET | Freq: Four times a day (QID) | ORAL | Status: DC | PRN
  Administered 2016-11-21 – 2016-11-27 (×12): 1 mg via ORAL
  Filled 2016-11-20 (×12): qty 2

## 2016-11-20 MED ORDER — LABETALOL HCL 200 MG PO TABS
300.0000 mg | ORAL_TABLET | Freq: Two times a day (BID) | ORAL | Status: DC
  Administered 2016-11-20 – 2016-11-28 (×16): 300 mg via ORAL
  Filled 2016-11-20 (×16): qty 1

## 2016-11-20 MED ORDER — HEPARIN 1000 UNIT/ML FOR PERITONEAL DIALYSIS: INTRAPERITONEAL | Status: DC | PRN

## 2016-11-20 MED ORDER — HEPARIN SODIUM (PORCINE) 5000 UNIT/ML IJ SOLN
5000.0000 [IU] | Freq: Three times a day (TID) | INTRAMUSCULAR | Status: DC
  Administered 2016-11-28: 5000 [IU] via SUBCUTANEOUS
  Filled 2016-11-20 (×14): qty 1

## 2016-11-20 MED ORDER — BUDESONIDE 0.5 MG/2ML IN SUSP: 0.5000 mg | Freq: Two times a day (BID) | RESPIRATORY_TRACT | Status: DC

## 2016-11-20 MED ORDER — NEPRO/CARBSTEADY PO LIQD
237.0000 mL | Freq: Two times a day (BID) | ORAL | Status: DC
  Administered 2016-11-26 – 2016-11-27 (×2): 237 mL via ORAL

## 2016-11-20 MED ORDER — POTASSIUM CHLORIDE CRYS ER 20 MEQ PO TBCR
40.0000 meq | EXTENDED_RELEASE_TABLET | Freq: Once | ORAL | Status: AC
  Administered 2016-11-20: 40 meq via ORAL
  Filled 2016-11-20: qty 2

## 2016-11-20 MED ORDER — HYDRALAZINE HCL 50 MG PO TABS
75.0000 mg | ORAL_TABLET | Freq: Three times a day (TID) | ORAL | Status: DC
  Administered 2016-11-20 – 2016-11-24 (×12): 75 mg via ORAL
  Filled 2016-11-20 (×11): qty 1

## 2016-11-20 NOTE — Progress Notes (Signed)
[name] Progress Note   Subjective: .  Resting in bedside chair completing last PD exchange.  Calm, c/o bandage hurting leg.    Objective Vitals:   11/19/16 1521 11/19/16 1952 11/19/16 2014 11/20/16 0430  BP: (!) 177/79 (!) 169/76  (!) 184/87  Pulse: 81 84  82  Resp:  17  16  Temp:  98.6 F (37 C)  98.7 F (37.1 C)  TempSrc:      SpO2:  98% 99% 98%  Weight:  84.8 kg (187 lb)    Height:       Physical Exam General: WNWD NAD Heart: RRR 3/6 systolic murmur  Lungs: CTAB Abdomen: soft NT/ND PD cath exit site with dressing in plance  No LE edema  R leg in ACE wrap some dried blood from top of bandage wrap   Dialysis Access: PD cath in place/ LUE AVF +bruit    Recent Labs Lab 11/18/16 0301 11/19/16 0749 11/20/16 0550  NA 134* 137 138  K 3.3* 3.0* 3.3*  CL 97* 98* 99*  CO2 27 27 27   GLUCOSE 97 115* 109*  BUN 43* 42* 47*  CREATININE 6.65* 7.39* 7.65*  CALCIUM 8.3* 8.7* 9.0  PHOS  --  4.5 5.0*     Recent Labs Lab 11/19/16 0749 11/20/16 0550  ALBUMIN 1.7* 1.7*    Recent Labs Lab 11/14/16 0230 11/15/16 0320 11/17/16 0325 11/19/16 0749  WBC 15.1* 12.0* 12.3* 13.6*  HGB 8.2* 8.1* 8.4* 9.3*  HCT 26.5* 25.7* 28.2* 30.6*  MCV 81.8 83.4 82.7 84.1  PLT 505* 494* 507* 599*       Component Value Date/Time   SDES FLUID PERITONEAL DIALYSIS 11/09/2016 0836   SDES FLUID PERITONEAL DIALYSIS 11/09/2016 0836   SPECREQUEST NONE 11/09/2016 0836   SPECREQUEST NONE 11/09/2016 0836   CULT NO GROWTH 5 DAYS 11/09/2016 0836   REPTSTATUS 11/14/2016 FINAL 11/09/2016 0836   REPTSTATUS 11/09/2016 FINAL 11/09/2016 0836    Recent Labs Lab 11/19/16 0821 11/19/16 1238 11/19/16 1628 11/19/16 2104 11/20/16 0749  GLUCAP 143* 115* 107* 97 140*   Medications: . dialysis solution 1.5% low-MG/low-CA    . dialysis solution 2.5% low-MG/low-CA     . amLODipine  10 mg Oral Daily  . aspirin  81 mg Oral Daily  . budesonide (PULMICORT) nebulizer solution  0.5  mg Nebulization BID  . calcitRIOL  0.5 mcg Oral Daily  . cinacalcet  60 mg Oral Q supper  . cloNIDine  0.2 mg Transdermal Weekly  . darbepoetin (ARANESP) injection - NON-DIALYSIS  200 mcg Subcutaneous Q Sun-1800  . divalproex  500 mg Oral Q12H  . docusate sodium  100 mg Oral Daily  . escitalopram  10 mg Oral Daily  . feeding supplement (NEPRO CARB STEADY)  237 mL Oral BID BM  . feeding supplement (PRO-STAT SUGAR FREE 64)  30 mL Oral BID  . ferric citrate  420 mg Oral TID WC  . gentamicin cream  1 application Topical Daily  . heparin  5,000 Units Subcutaneous Q8H  . hydrALAZINE  75 mg Oral Q8H  . insulin aspart  0-9 Units Subcutaneous TID WC  . isosorbide dinitrate  5 mg Oral BID  . labetalol  300 mg Oral BID  . mouth rinse  15 mL Mouth Rinse BID  . multivitamin  1 tablet Oral QHS  . pantoprazole  40 mg Oral Daily  . pravastatin  20 mg Oral Daily  . saccharomyces boulardii  250 mg Oral BID  Dialysis Orders:  [place] home [facility].  CCPD: 3L fill 5 exchanges at night with 5th fill manually drained 1-1/2 hours later. Uses mostly 1's and 2's at home.  Assessment/Plan:  1. Cellulitis, Ischemic R foot / sp R fem-pop bypass w great toe amp on 5/8 - s/p course of IV Vanc Blood cultures no growth. Having issue w/top of bandage/wrap irritating upper end of incision.  2. AMS/delerium - psychosis w agitation - extensive psych history per wife - seems to be improving - meds per primary 3. ESRD - CCPD. Use all 2.5% dialysate again today. 4. Hypokalemia - K 3.3 - Given another dose oral K 40 po today  5. 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 HTN/volume - BP remains elevated follow today may need med adjustment- Continue to use all 2.5% dialysate  8. Nutrition - renal vit/protein supp for low album 9. Dispo - PT/OT recommending CIR   Lynnda Child PA-C [facility] Pager  [phone] 11/20/2016,9:30 AM   I have seen and examined this patient and agree with plan and assessment in the above note with renal recommendations/intervention highlighted. Plan for transfer to rehab later today. Give additional K repletion. Continue all 2.5% dialysate. Mentally as good as I have seen this week.  Jamal Maes, MD [place] Pager 11/20/2016, 1:17 PM

## 2016-11-20 NOTE — Progress Notes (Signed)
Pt refuses his am hydralazine and heparin injection. Pt states "I am done taking medications, just get me my papers so I can sign myself out of here" after being educated that he may not sit on the side of the bed unattended for safety. Pt was educated that we could assist him to the chair or we could slide him up in the bed to a sitting position. Pt declines both. Spouse at bedside. Spouse reports to "hold off on AMA papers at this morning he's just mad". Will continue to monitor. Dorthey Sawyer, RN

## 2016-11-20 NOTE — PMR Pre-admission (Signed)
PMR Admission Coordinator Pre-Admission Assessment  Patient: Hayden [name] is an 46 y.o., male MRN: [id number] DOB: December 14, 1970 Height: 5\' 10"  (177.8 cm) Weight: 84.8 kg (187 lb)              Insurance Information HMO:     PPO:      PCP:      IPA:      80/20:      OTHER:  PRIMARY: Medicare A and B      Policy#: [id number]      Subscriber:  self CM Name:        Phone#:      Fax#:  Pre-Cert#:       Employer:  Not employed Benefits:  Phone #:       Name:  Eff. Date:  Part A 09/29/93; Part B 06/01/15     Deduct:  $1340      Out of Pocket Max:  n/a      Life Max:  n/a CIR:  100%      SNF:  100% first 20 days Outpatient:  80%/20%     Co-Pay:   Home Health:  100%      Co-Pay:   DME:  80%/20%     Co-Pay:   Providers:  Pt. choice SECONDARY:  [employer]      Policy#:  [id number]      Subscriber:  Wife, Bynum Bellows CM Name:        Phone#:      Fax#:  Pre-Cert#:       Employer:  Benefits:  Phone #:      Name:  Eff. 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 Per chart review and wife,patient lives with his spouse was independent with a cane prior to admission. Mobile home with 4 steps to entry. Wife works a swing shift. Presented 10/23/2016 with right foot redness and pain. Initial injury happened when he caught his foot and the trackof  the backseat of his sisters minivan. He initially tried to keep the area clean and dry. He was given a dose of vancomycin in the emergency room but left AMA. Findings a WBC elevated 14,600, lactic acid 0.73. X-rays of the right foot show no acute abnormalities. He was placed on broad-spectrum antibiotics. 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 The anterior tibial artery appears occluded. Underwent angioplasty right superficial femoral artery per vascular surgery 11/05/2016 per Dr. Bridgett Larsson after receiving cardiac clearance as well as right first digit amputation and dorsal  ulcer debridement. Hospital course neurology consulted for altered mental status and questionable seizure EEG showed nonspecific cerebral dysfunction and encephalopathy no seizure activity noted and maintained on Depakote. Patient did require short-term intubation extubated 11/06/2016. Dialysis ongoing as per renal services. Patient with intermittent bouts of agitationand restlessness. Subcutaneous heparin for DVT prophylaxis. Physical therapy evaluation completed with recommendations of physical medicine rehabilitation consult.Patient was admitted for a comprehensive rehabilitation program Total: 2  NIH    Past Medical History  Past Medical History:  Diagnosis Date  . Anemia March 2014  . Cancer ([place])    Kidney  . CHF (congestive heart failure) ([place]) 7939   Acute systolic and diastolic CHF  . Depression    & rage --  was in counseling....great now  . Diabetes mellitus    NO DM SINCE LOST 130LBS  . ESRD on peritoneal dialysis ([place]) 2018   Started in-center HD approx 2015 for 2 years, then did about 1 year of home HD and then started peritoneal dialysis in early 2018.    Marland Kitchen History of kidney cancer   . Hyperlipidemia   . Hypertension   . Obesity   . PONV (postoperative  nausea and vomiting)   . Stroke [facility])     Family History  family history includes Deep vein thrombosis in his father; Heart attack in his father; Heart disease in his mother; Other in his other.  Prior Rehab/Hospitalizations:  Has the patient had major surgery during 100 days prior to admission? No  Current Medications   Current Facility-Administered Medications:  .  acetaminophen (TYLENOL) tablet 650 mg, 650 mg, Oral, Q6H PRN, Jani Gravel, MD, 650 mg at 11/19/16 0300 .  amLODipine (NORVASC) tablet 10 mg, 10 mg, Oral, Daily, Roney Jaffe, MD, 10 mg at 11/20/16 1035 .  aspirin chewable tablet 81 mg, 81 mg, Oral, Daily, Javier Glazier, MD, 81 mg at 11/20/16 1035 .  budesonide (PULMICORT) nebulizer solution 0.5 mg, 0.5 mg, Nebulization, BID, Jennelle Human B, NP, 0.5 mg at 11/20/16 0749 .  cinacalcet (SENSIPAR) tablet 60 mg, 60 mg, Oral, Q supper, Madelon Lips, MD, 60 mg at 11/19/16 1740 .  cloNIDine (CATAPRES - Dosed in mg/24 hr) patch 0.2 mg, 0.2 mg, Transdermal, Weekly, Jennelle Human B, NP, 0.2 mg at 11/14/16 1448 .  Darbepoetin Alfa (ARANESP) injection 200 mcg, 200 mcg, Subcutaneous, Q Sun-1800, Madelon Lips, MD, 200 mcg at 11/17/16 2007 .  dialysis solution 1.5% low-MG/low-CA dianeal solution, , Intraperitoneal, Q24H, Hiatt, Kendra P, RPH .  dialysis solution 2.5% low-MG/low-CA dianeal solution, , Intraperitoneal, Q24H, Jamal Maes, MD .  divalproex (DEPAKOTE SPRINKLE) capsule 500 mg, 500 mg, Oral, Q12H, Darrick Meigs, Marge Duncans, MD, 500 mg at 11/20/16 1038 .  docusate sodium (COLACE) capsule 100 mg, 100 mg, Oral, Daily, Rai, Ripudeep K, MD, 100 mg at 11/20/16 1034 .  escitalopram (LEXAPRO) tablet 10 mg, 10 mg, Oral, Daily, Darrick Meigs, Marge Duncans, MD, 10 mg at 11/20/16 1035 .  feeding supplement (NEPRO CARB STEADY) liquid 237 mL, 237 mL, Oral, BID BM, Rai, Ripudeep K, MD, Last Rate: 0 mL/hr at 11/14/16 1503, 237 mL at 11/18/16 1400 .  feeding supplement (PRO-STAT SUGAR FREE 64) liquid 30  mL, 30 mL, Oral, BID, Madelon Lips, MD, 30 mL at 11/19/16 2123 .  fentaNYL (SUBLIMAZE) injection 25-50 mcg, 25-50 mcg, Intravenous, Q3H PRN, Rai, Ripudeep K, MD, 50 mcg at 11/18/16 0243 .  ferric citrate (AURYXIA) tablet 420 mg, 420 mg, Oral, TID WC, Madelon Lips, MD, 420 mg at 11/20/16 0825 .  gentamicin cream (GARAMYCIN) 0.1 % 1 application, 1 application, Topical, Daily, Roney Jaffe, MD, 1 application at 33/35/45 1048 .  Gerhardt's butt cream, , Topical, PRN, Rai, Ripudeep K, MD .  gi cocktail (Maalox,Lidocaine,Donnatal), 30 mL, Oral, TID PRN, Rai, Ripudeep K, MD .  heparin 2,500 Units in dialysis solution 1.5% low-MG/low-CA 5,000 mL dialysis solution, , Peritoneal Dialysis, PRN, Jaquita Folds, RPH .  heparin 2,500 Units in dialysis solution 2.5% low-MG/low-CA 5,000 mL dialysis solution, , Peritoneal Dialysis, PRN, Roney Jaffe, MD .  heparin injection 5,000 Units, 5,000 Units, Subcutaneous, Q8H, Rhyne, Samantha J, PA-C, 5,000 Units at 11/18/16 6256 .  hydrALAZINE (APRESOLINE) tablet 75 mg, 75 mg, Oral, Q8H, Oswald Hillock, MD, 75 mg at 11/19/16 2124 .  HYDROcodone-acetaminophen (NORCO/VICODIN) 5-325 MG per tablet 1-2 tablet, 1-2 tablet, Oral, Q6H PRN, Ollis, Brandi L, NP, 2 tablet at 11/20/16 0946 .  insulin aspart (novoLOG) injection 0-9 Units, 0-9 Units, Subcutaneous, TID WC, Lama, Gagan S, MD .  isosorbide dinitrate (ISORDIL) tablet 5 mg, 5 mg, Oral, BID, Jennelle Human B, NP, 5 mg at 11/20/16 1035 .  labetalol (NORMODYNE) tablet 300 mg, 300 mg, Oral, BID, Roney Jaffe, MD, 300 mg at 11/20/16 1034 .  LORazepam (ATIVAN) tablet 1 mg, 1 mg, Oral, Q6H PRN, Oswald Hillock, MD, 1 mg at 11/19/16 2125 .  MEDLINE mouth rinse, 15 mL, Mouth Rinse, BID, Ollis, Brandi L, NP, 15 mL at 11/18/16 2124 .  multivitamin (RENA-VIT) tablet 1 tablet, 1 tablet, Oral, QHS, Lynnda Child, PA-C, 1 tablet at 11/19/16 2124 .  pantoprazole (PROTONIX) EC tablet 40 mg, 40 mg, Oral, Daily, Rai,  Ripudeep K, MD, 40 mg at 11/20/16 0824 .  pravastatin (PRAVACHOL) tablet 20 mg, 20 mg, Oral, Daily, Javier Glazier, MD, 20 mg at 11/20/16 1034 .  prochlorperazine (COMPAZINE) injection 10 mg, 10 mg, Intravenous, Q6H PRN, Eugenie Filler, MD .  saccharomyces boulardii (FLORASTOR) capsule 250 mg, 250 mg, Oral, BID, de Dios, [place], MD, 250 mg at 11/20/16 1034  Patients Current Diet: Diet renal with fluid restriction Fluid restriction: 1200 mL Fluid; Room service appropriate? Yes; Fluid consistency: Thin  Precautions / Restrictions Precautions Precautions: Fall Precaution Comments: Pt confused and delusional at times  Restrictions Weight Bearing Restrictions: No   Has the patient had 2 or more falls or a fall with injury in the past year?No  Prior Activity Level Limited Community (1-2x/wk): Pt. reports he goes out of the home at least 2x/week for errands and out to eat  [place] / [place] Devices/Equipment: Scales, Environmental consultant (specify type), Eyeglasses, Cane (specify quad or straight), Hand-held shower hose Home Equipment: Cane - single point, Grab bars - tub/shower, Hand held shower head  Prior Device Use: Indicate devices/aids used by the patient prior to current illness, exacerbation or injury? None of the above  Prior Functional Level Prior Function Level of Independence: Independent (does not drive)  Self Care: Did the patient need help bathing, dressing, using the toilet or eating?  Independent  Indoor Mobility: Did the patient need assistance with walking from room to room (with or without device)? Independent  Stairs: Did the patient need assistance with internal or external stairs (with or without device)? Independent  Functional Cognition: Did the patient need help planning regular tasks such as shopping or remembering to take medications? Independent  Current Functional Level Cognition  Overall Cognitive Status:  Impaired/Different from baseline Current Attention Level: Sustained Orientation Level: Oriented to person, Oriented  to place Following Commands: Follows one step commands consistently Safety/Judgement: Decreased awareness of safety, Decreased awareness of deficits General Comments: Overall appropriate with communication/conversation today; decr safety awareness as he states he will get up when he wants to by himself; Tangential in conversation at times; emotionally labile (cried that his kids have not visited) pt reports "are you the ones i have been giving a hard time today" pt unaware that therapist are new to his day    Extremity Assessment (includes Sensation/Coordination)  Upper Extremity Assessment: Generalized weakness  Lower Extremity Assessment: Defer to PT evaluation RLE Deficits / Details: incision in groin, medial knee and foot wrapped with bandage.  moves well in bed, but pain with standing. moving RLE: Unable to fully assess due to pain    ADLs  Overall ADL's : Needs assistance/impaired Eating/Feeding: Set up, Sitting Grooming: Wash/dry hands, Wash/dry face, Oral care, Set up, Sitting Upper Body Bathing: Moderate assistance, Sitting Upper Body Bathing Details (indicate cue type and reason): Assisted nsg with bathing pt back  Lower Body Bathing: Moderate assistance, Sit to/from stand Lower Body Bathing Details (indicate cue type and reason): Assisted RN with bathing peri area  Upper Body Dressing : Moderate assistance, Sitting Lower Body Dressing: Moderate assistance Lower Body Dressing Details (indicate cue type and reason): pt able to don L sock and total (A) fo rR sock. Pt reluctant to put on sock initially  Toilet Transfer: Minimal assistance, +2 for physical assistance, Stand-pivot, RW Toilet Transfer Details (indicate cue type and reason): declined at this time. reports no needs Toileting- Water quality scientist and Hygiene: Maximal assistance, Sit to/from  stand Functional mobility during ADLs: Minimal assistance, +2 for physical assistance, Rolling walker General ADL Comments: Pt internally distracted and needed return to sitting. Pt verbalized no desire to proceed at [facility] stime.    Mobility  Overal bed mobility: Needs Assistance Bed Mobility: Supine to Sit, Sit to Supine Rolling: Supervision Supine to sit: Supervision Sit to supine: Supervision General bed mobility comments: in chair on arrival    Transfers  Overall transfer level: Needs assistance Equipment used: Rolling walker (2 wheeled) Transfer via Lift Equipment: Stedy Transfers: Sit to/from Stand Sit to Stand: Mod assist, +2 safety/equipment Stand pivot transfers: Min guard, +2 physical assistance General transfer comment: Cues for hand placement and safety; Light mod assist to power up and steady    Ambulation / Gait / Stairs / Wheelchair Mobility  Ambulation/Gait Ambulation/Gait assistance: Min guard, +2 safety/equipment Ambulation Distance (Feet): 4 Feet Assistive device: Rolling walker (2 wheeled) Gait Pattern/deviations:  (Hop-to) General Gait Details: Took a few steps, largely keeping NWB RLE; Overall good control of RW, and good support with UEs on RW Gait velocity: decr Gait velocity interpretation: Below normal speed for age/gender    Posture / Balance Dynamic Sitting Balance Sitting balance - Comments: requires UE support  Balance Overall balance assessment: Needs assistance Sitting-balance support: Feet supported Sitting balance-Leahy Scale: Good Sitting balance - Comments: requires UE support  Standing balance support: During functional activity Standing balance-Leahy Scale: Poor Standing balance comment: requires bil. UE support and close guard; tried to be able to touch R heel to ground, but ultimately unable    Special needs/care consideration BiPAP/CPAP    no CPM  no Continuous Drip IV   no Dialysis   Yes, PD         Life Vest     no Oxygen    no Special Bed   no Trach Size   no Wound Vac (  area)   no      Skin   Surgical wounds right foot and leg with dressings intact                             Bowel mgmt: 11/20/16, continent Bladder mgmt: continent, urinal Diabetic mgmt not on diabetes meds at home     Previous Home Environment Living Arrangements: Spouse/significant other Available Help at Discharge: Family, Available PRN/intermittently (wife works swing shift) Type of Home: Mobile home Home Layout: One level Home Access: Stairs to enter Entrance Stairs-Rails: Right, Left Entrance Stairs-Number of Steps: 4 Bathroom Shower/Tub: Gaffer, Charity fundraiser: Standard Home Care Services: No Additional Comments: patient unable to relate prior level/home info, above taken from admission last year.   Discharge Living Setting Plans for Discharge Living Setting: Patient's home Type of Home at Discharge: Mobile home Discharge Home Layout: One level Discharge Home Access: Stairs to enter Entrance Stairs-Rails: Right, Left Entrance Stairs-Number of Steps: 3-4 Discharge Bathroom Shower/Tub: Tub only, Walk-in shower Discharge Bathroom Toilet: Standard Discharge Bathroom Accessibility: Yes How Accessible: Accessible via walker Does the patient have any problems obtaining your medications?: No  Social/Family/Support Systems Patient Roles: Spouse, Parent Anticipated Caregiver: wife, Raffael Bugarin Anticipated Caregiver's Contact Information: Gurnoor Ursua, [phone] (cell) Ability/Limitations of Caregiver: Maudie Mercury works full time; pt. and wife have 2 working children in the home who will be with pt. all except one hour when wife is at work Careers adviser: Other (Comment) (23/24 hours per day) Discharge Plan Discussed with Primary Caregiver: Yes Is Caregiver In Agreement with Plan?: Yes Does Caregiver/Family have Issues with Lodging/Transportation while Pt is in Rehab?: No   Goals/Additional Needs Patient/Family  Goal for Rehab: supervision PT/OT/SLP Expected length of stay: 10-14 days Cultural Considerations: n/a Dietary Needs: renal diet, fluid restrictions, thin liquids Equipment Needs: TBA Special Service Needs: pt. is a peritoneal dialysis patient.  I have arranged with HD to have pt. removed from PD by 8am each morning Pt/Family Agrees to Admission and willing to participate: Yes Program Orientation Provided & Reviewed with Pt/Caregiver Including Roles  & Responsibilities: Yes   Decrease burden of Care through IP rehab admission: n/a   Possible need for SNF placement upon discharge:  Not anticipated   Patient Condition: This patient's medical and functional status has changed since the consult dated 11/12/16  in which the Rehabilitation Physician determined and documented that the patient was potentially appropriate for intensive rehabilitative care in an inpatient rehabilitation facility. Pt. Now able to tolerate 3 hours of therapies.  Issues have been addressed and update has been discussed with Dr. Posey Pronto and patient now appropriate for inpatient rehabilitation. Will admit to inpatient rehab today.   Preadmission Screen Completed By:  Gerlean Ren, 11/20/2016 12:53 PM ______________________________________________________________________   Discussed status with Dr. Posey Pronto on 5.23.18 at  1307  and received telephone approval for admission today.  Admission Coordinator:  Gerlean Ren, time 1307 Sudie Grumbling 11/20/16

## 2016-11-20 NOTE — Progress Notes (Signed)
Patient ID: Hayden [name], male   DOB: September 22, 1970, 46 y.o.   MRN: [id number] Pt arrived to unit via w/c.  Belongs are cell, clothes, and glasses. Pt alert and oriented to person, place, and situation at this time, no c/o pain, lungs clear in all fields, pt sitting in recliner, call bell in reach.

## 2016-11-20 NOTE — Discharge Summary (Signed)
Physician Discharge Summary   Patient ID: Hayden [name] MRN: [id number] DOB/AGE: 1970-08-23 46 y.o.  Admit date: 10/23/2016 Discharge date: 11/20/2016  Primary Care Physician:  Corliss Parish, MD  Discharge Diagnoses:    . Cellulitis of great toe of right foot . Hypertensive urgency, malignant . Leukocytosis . Hyperkalemia . Anxiety and depression   Acute delirium/metabolic encephalopathy   Peripheral vascular disease   ESRD on peritoneal dialysis   Anemia of chronic disease   Hyperlipidemia   Diabetes mellitus type 2  Consults:   Vascular surgery, Dr. Bridgett Larsson Nephrology Psychiatry Pulmonary critical care Cardiology Inpatient rehabilitation Neurology  Recommendations for Outpatient Follow-up:  1. Physical therapy recommended inpatient rehabilitation 2. Please repeat CBC/BMET at next visit 3. Please note the following changes in his medications for final discharge from the inpatient rehabilitation and currently on these medications inpatient  - Labetalol was increased to 300 mg twice a day, currently he is on this dose - Hydralazine 75 mg every 8 hours - Psychiatry had recommended to stop Wellbutrin and Paxil due to prolonged QTC -While inpatient, psychiatry has recommended that patient continue Ativan 1 mg every 6 hours as needed. When patient is ready for discharge from inpatient rehabilitation he can continue his outpatient Xanax dose. - Patient was placed on Depakote 500 mg every 12 hours. Please give him prescription for Depakote at the time of discharge.  DIET: Renal diet    Allergies:   Allergies  Allergen Reactions  . Dilaudid [Hydromorphone Hcl] Other (See Comments)    ABNORMAL BEHAVIORS "VERBALLY AND PHYSICALLY ABUSIVE" PSYCHOSIS  . Morphine And Related Itching and Other (See Comments)    SYNCOPE     DISCHARGE MEDICATIONS: Current Discharge Medication List    CONTINUE these medications which have NOT CHANGED   Details  ALPRAZolam (XANAX) 1  MG tablet Take 1 mg by mouth daily as needed for anxiety.     amLODipine (NORVASC) 10 MG tablet Take 10 mg by mouth daily.    aspirin EC 81 MG tablet Take 81 mg by mouth daily.    cinacalcet (SENSIPAR) 60 MG tablet Take 60 mg by mouth daily.    cloNIDine (CATAPRES - DOSED IN MG/24 HR) 0.2 mg/24hr patch Place 1 patch onto the skin once a week.    isosorbide dinitrate (ISORDIL) 10 MG tablet Take 10 mg by mouth daily.    omeprazole (PRILOSEC) 40 MG capsule Take 40 mg by mouth daily.    pravastatin (PRAVACHOL) 20 MG tablet Take 20 mg by mouth daily.    Vitamin D, Ergocalciferol, (DRISDOL) 50000 units CAPS capsule Take 50,000 Units by mouth every 7 (seven) days. Every 7 days; on Thursday    saccharomyces boulardii (FLORASTOR) 250 MG capsule Take 1 capsule (250 mg total) by mouth 2 (two) times daily. Qty: 30 capsule, Refills: 0      STOP taking these medications     buPROPion (WELLBUTRIN XL) 150 MG 24 hr tablet      clindamycin (CLEOCIN) 300 MG capsule      hydrALAZINE (APRESOLINE) 100 MG tablet      labetalol (NORMODYNE) 100 MG tablet      PARoxetine (PAXIL) 20 MG tablet      temazepam (RESTORIL) 15 MG capsule      buPROPion (WELLBUTRIN SR) 150 MG 12 hr tablet      furosemide (LASIX) 80 MG tablet          Brief H and P: For complete details please refer to admission H and P, but  in brief 46 y/o male with ESRD on PD, admitted on 4/25 with cellulitis and gangrene of right great toe. He was also noted to be encephalopathic. He was seen by vascular surgery and underwent right great toe amputation and right common femoral artery to below the knee popliteal artery bypass. He is continued on vancomycin. He is undergoing PD per nephrology. Hospital course has been complicated by encephalopathy and worsening delirium, needing precedex infusion. Since being started on precedex, his mental status has started to improve. He has been weaned off precedex. PT evaluation recommended CIR,  inpatient rehabilitation consulted. [facility] assumed care 5/13.   Hospital Course:   Cellulitis with gangrene of the right big toe - Patient was started on IV vancomycin, vascular surgery and Dr. Sharol Given were consulted - Status post amputation of the right great toe on 5/8  Acute metabolic encephalopathy with delirium - Currently improving, at his baseline status per his wife at the bedside - Patient's hospital course was complicated due to acute delirium. He was seen by neurology and psychiatry during this admission. It was felt that patient's encephalopathy could be from withdrawal of medication/alcohol. Antipsychotics were not recommended due to prolonged QTC and he was started on Depakote and lorazepam. - After the surgery, patient's delirium worsened and he was transferred to ICU. He was placed on Precedex infusion. - Once the mental status improved and he was weaned off of Precedex, he was transferred to stepdown unit, started on Wellbutrin, Xanax and Depakote. However he continued to have issues with encephalopathy. Psychiatry was reconsulted for adjustment of his medications. Psychiatry, Dr Tory Emerald recommended discontinuing Wellbutrin and Paxil due to prolonged QTC. Patient was placed on Ativan 1 mg every 6 hours PRN for anxiety/sleep and Depakote. - Patient is currently close to his baseline mental status. Patient is currently accepted to inpatient rehabilitation.  Peripheral vascular disease - Status post right common femoral artery to below the knee popliteal artery bypass with non-reversed ipsilateral greater saphenous vein - Vascular surgery following closely  Hypertensive urgency, malignant - BP currently improving, continue clonidine patch, hydralazine has been increased to 75 mg 3 times a day, labetalol increased to 300 mg twice a day, continue isosorbide  ESRD - On peritoneal dialysis. Briefly patient was placed on hemodialysis during acute delirium, now back on peritoneal  dialysis. Nephrology following closely  Anemia of chronic disease - H&H currently stable  Hyperlipidemia - Continue Pravachol  Diabetes mellitus type 2 - CABG stable, continue sliding scale insulin  Constipation, currently resolved, patient was started on lactulose for 3 doses  GERD Continue PPI and GI cocktail as needed    Day of Discharge BP (!) 150/91   Pulse 99   Temp 98 F (36.7 C) (Oral)   Resp 18   Ht _0  (1.778 m)   Wt 84.8 kg (187 lb)   SpO2 97%   BMI 26.83 kg/m   Physical Exam: General: Alert and awake oriented x3 not in any acute distress. HEENT: anicteric sclera, pupils reactive to light and accommodation CVS: S1-S2 clear,  3/6 systolic murmur  Chest: clear to auscultation bilaterally, no wheezing rales or rhonchi Abdomen: soft nontender, nondistended, normal bowel sounds, PD cath in place  Extremities: no cyanosis, clubbing or edema noted bilaterally, Right lower extremity dressing intact, right foot in the Ace wrap  Neuro: Cranial nerves II-XII intact, no focal neurological deficits   The results of significant diagnostics from this hospitalization (including imaging, microbiology, ancillary and laboratory) are listed below for reference.  LAB RESULTS: Basic Metabolic Panel:  Recent Labs Lab 11/19/16 0749 11/20/16 0550  NA 137 138  K 3.0* 3.3*  CL 98* 99*  CO2 27 27  GLUCOSE 115* 109*  BUN 42* 47*  CREATININE 7.39* 7.65*  CALCIUM 8.7* 9.0  PHOS 4.5 5.0*   Liver Function Tests:  Recent Labs Lab 11/19/16 0749 11/20/16 0550  ALBUMIN 1.7* 1.7*   No results for input(s): LIPASE, AMYLASE in the last 168 hours. No results for input(s): AMMONIA in the last 168 hours. CBC:  Recent Labs Lab 11/17/16 0325 11/19/16 0749  WBC 12.3* 13.6*  HGB 8.4* 9.3*  HCT 28.2* 30.6*  MCV 82.7 84.1  PLT 507* 599*   Cardiac Enzymes: No results for input(s): CKTOTAL, CKMB, CKMBINDEX, TROPONINI in the last 168 hours. BNP: Invalid input(s):  POCBNP CBG:  Recent Labs Lab 11/20/16 0749 11/20/16 1224  GLUCAP 140* 113*    Significant Diagnostic Studies:  Dg Foot Complete Right  Result Date: 10/23/2016 CLINICAL DATA:  Recent foot injury 2 weeks ago with persistent pain and discoloration EXAM: RIGHT FOOT COMPLETE - 3+ VIEW COMPARISON:  None. FINDINGS: No acute fracture or dislocation is noted. Diffuse vascular calcifications are seen. IMPRESSION: No acute abnormality noted. Electronically Signed   By: Inez Catalina M.D.   On: 10/23/2016 17:50    2D ECHO: Study Conclusions  - Left ventricle: The cavity size was normal. There was severe   concentric hypertrophy. Systolic function was normal. The   estimated ejection fraction was in the range of 60% to 65%. Wall   motion was normal; there were no regional wall motion   abnormalities. Doppler parameters are consistent with   pseudonormal left ventricular relaxation (grade 2 diastolic   dysfunction). The E/e&' ratio is >30 with medial e&' velocity <6,   suggesting elevated LV filling pressure. - Aortic valve: Heavily calcified trileaflet aortic valve. Moderate   stenosis. Mean gradient (S): 16 mm Hg. Peak gradient (S): 29 mm   Hg. Valve area (VTI): 1.41 cm^2. Valve area (Vmax): 1.16 cm^2. - Mitral valve: Moderate posterior MAC. Mildly thickened leaflets.   Mild stenosis. Mean gradient (D): 8 mm Hg. Valve area by pressure   half-time: 1.9 cm^2. - Left atrium: The atrium was moderately dilated. - Right ventricle: The cavity size was normal. The moderator band   was prominent. Systolic function was normal. - Right atrium: The atrium was mildly dilated. - Tricuspid valve: There was mild regurgitation. - Pulmonary arteries: PA peak pressure: 35 mm Hg (S). - Inferior vena cava: The vessel was normal in size. The   respirophasic diameter changes were in the normal range (>= 50%),   consistent with normal central venous pressure.  Impressions:  - Compared to a prior study in  2016, there is now severe LVH, EF   remains normal at 60-65%, grade 2 DD with high LV filling   pressure. There is now moderate LAE, mild mitral stenosis and   mild pulmonary hypertension with RVSP of 35 mmHg.   Disposition and Follow-up:    DISPOSITION CIR    DISCHARGE FOLLOW-UP Follow-up Information    Newt Minion, MD Follow up in 1 week(s).   Specialty:  Orthopedic Surgery Contact information: [place] [address] [phone]        Corliss Parish, MD. Schedule an appointment as soon as possible for a visit in 2 week(s).   Specialty:  Nephrology Contact information: [address] [place] [address] [phone]  Conrad Lapwai, MD. Schedule an appointment as soon as possible for a visit in 2 week(s).   Specialties:  Vascular Surgery, Cardiology Contact information: [address] [place] [address] [phone]            Time spent on Discharge: 39 minutes  Signed:   Estill Cotta M.D. [name] 11/20/2016, 1:24 PM Pager: [phone]

## 2016-11-20 NOTE — Discharge Summary (Signed)
Pt transferred to [facility]. Report given to RN. Denies questions.

## 2016-11-20 NOTE — Progress Notes (Signed)
Gerlean Ren Rehab Admission Coordinator Signed Physical Medicine and Rehabilitation  PMR Pre-admission Date of Service: 11/20/2016 12:53 PM  Related encounter: ED to Hosp-Admission (Discharged) from 10/23/2016 in [place] MEDICAL/RENAL       [] Hide copied text PMR Admission Coordinator Pre-Admission Assessment  Patient: Hayden [name] is an 46 y.o., male MRN: [id number] DOB: [birth year] Height: 5\' 10"  (177.8 cm) Weight: 84.8 kg (187 lb)                                                                                                                                                  Insurance Information HMO:     PPO:      PCP:      IPA:      80/20:      OTHER:  PRIMARY: Medicare A and B      Policy#: [id number]      Subscriber:  self CM Name:        Phone#:      Fax#:  Pre-Cert#:       Employer:  Not employed Benefits:  Phone #:       Name:  Eff. Date:  Part A 09/29/93; Part B 06/01/15     Deduct:  $1340      Out of Pocket Max:  n/a      Life Max:  n/a CIR:  100%      SNF:  100% first 20 days Outpatient:  80%/20%     Co-Pay:   Home Health:  100%      Co-Pay:   DME:  80%/20%     Co-Pay:   Providers:  Pt. choice SECONDARY:  [employer]      Policy#:  [id number]      Subscriber:  Wife, Bynum Bellows CM Name:        Phone#:      Fax#:  Pre-Cert#:       Employer:  Benefits:  Phone #:      Name:  Eff. Date:      Deduct:       Out of Pocket Max:       Life Max:  CIR:       SNF:  Outpatient:      Co-Pay:  Home Health:       Co-Pay:  DME:      Co-Pay:   Medicaid Application Date:       Case Manager:  Disability Application Date:       Case Worker:   Emergency Contact Information        Contact Information    Name Relation Home Work Mobile   Morriss,Kimberly Spouse  [phone]   Sundeep, Destin   [phone]     Current Medical History  Patient Admitting Diagnosis: Right first digit amputation History of Present Illness: Hayden [name]  Toomesis  a 46 y.o.right handed malewith history of 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 Per chart review and wife,patient lives with his spouse was independent with a cane prior to admission. Mobile home with 4 steps to entry. Wife works a swing shift. Presented 10/23/2016 with right foot redness and pain. Initial injury happened when he caught his foot and the trackof the backseat of his sisters minivan. He initially tried to keep the area clean and dry. He was given a dose of vancomycin in the emergency room but left AMA. Findings a WBC elevated 14,600, lactic acid 0.73. X-rays of the right foot show no acute abnormalities. He was placed on broad-spectrum antibiotics. 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 The anterior tibial artery appears occluded. Underwent angioplasty right superficial femoral artery per vascular surgery 11/05/2016 per Dr. Bridgett Larsson after receiving cardiac clearance as well as right first digit amputation and dorsal ulcer debridement. Hospital course neurology consulted for altered mental status and questionable seizure EEG showed nonspecific cerebral dysfunction and encephalopathy no seizure activity noted and maintained on Depakote. Patient did require short-term intubation extubated 11/06/2016. Dialysis ongoing as per renal services. Patient with intermittent bouts of agitationand restlessness. Subcutaneous heparin for DVT prophylaxis. Physical therapy evaluation completed with recommendations of physical medicine rehabilitation consult.Patient was admitted for a comprehensive rehabilitation program Total: 2  NIH  Past Medical History      Past Medical History:  Diagnosis Date  . Anemia March 2014  . Cancer ([place])    Kidney  . CHF (congestive heart  failure) ([place]) 0865   Acute systolic and diastolic CHF  . Depression    & rage --  was in counseling....great now  . Diabetes mellitus    NO DM SINCE LOST 130LBS  . ESRD on peritoneal dialysis ([place]) 2018   Started in-center HD approx 2015 for 2 years, then did about 1 year of home HD and then started peritoneal dialysis in early 2018.    Marland Kitchen History of kidney cancer   . Hyperlipidemia   . Hypertension   . Obesity   . PONV (postoperative nausea and vomiting)   . Stroke [facility])     Family History  family history includes Deep vein thrombosis in his father; Heart attack in his father; Heart disease in his mother; Other in his other.  Prior Rehab/Hospitalizations:  Has the patient had major surgery during 100 days prior to admission? No  Current Medications   Current Facility-Administered Medications:  .  acetaminophen (TYLENOL) tablet 650 mg, 650 mg, Oral, Q6H PRN, Jani Gravel, MD, 650 mg at 11/19/16 7846 .  amLODipine (NORVASC) tablet 10 mg, 10 mg, Oral, Daily, Roney Jaffe, MD, 10 mg at 11/20/16 1035 .  aspirin chewable tablet 81 mg, 81 mg, Oral, Daily, Javier Glazier, MD, 81 mg at 11/20/16 1035 .  budesonide (PULMICORT) nebulizer solution 0.5 mg, 0.5 mg, Nebulization, BID, Jennelle Human B, NP, 0.5 mg at 11/20/16 0749 .  cinacalcet (SENSIPAR) tablet 60 mg, 60 mg, Oral, Q supper, Madelon Lips, MD, 60 mg at 11/19/16 1740 .  cloNIDine (CATAPRES - Dosed in mg/24 hr) patch 0.2 mg, 0.2 mg, Transdermal, Weekly, Jennelle Human B, NP, 0.2 mg at 11/14/16 1448 .  Darbepoetin Alfa (ARANESP) injection 200 mcg, 200 mcg, Subcutaneous, Q Sun-1800, Madelon Lips, MD, 200 mcg at 11/17/16 2007 .  dialysis  solution 1.5% low-MG/low-CA dianeal solution, , Intraperitoneal, Q24H, Hiatt, Kendra P, RPH .  dialysis solution 2.5% low-MG/low-CA dianeal solution, , Intraperitoneal, Q24H, Jamal Maes, MD .  divalproex (DEPAKOTE SPRINKLE) capsule 500 mg, 500 mg, Oral, Q12H, Darrick Meigs,  Marge Duncans, MD, 500 mg at 11/20/16 1038 .  docusate sodium (COLACE) capsule 100 mg, 100 mg, Oral, Daily, Rai, Ripudeep K, MD, 100 mg at 11/20/16 1034 .  escitalopram (LEXAPRO) tablet 10 mg, 10 mg, Oral, Daily, Darrick Meigs, Marge Duncans, MD, 10 mg at 11/20/16 1035 .  feeding supplement (NEPRO CARB STEADY) liquid 237 mL, 237 mL, Oral, BID BM, Rai, Ripudeep K, MD, Last Rate: 0 mL/hr at 11/14/16 1503, 237 mL at 11/18/16 1400 .  feeding supplement (PRO-STAT SUGAR FREE 64) liquid 30 mL, 30 mL, Oral, BID, Madelon Lips, MD, 30 mL at 11/19/16 2123 .  fentaNYL (SUBLIMAZE) injection 25-50 mcg, 25-50 mcg, Intravenous, Q3H PRN, Rai, Ripudeep K, MD, 50 mcg at 11/18/16 0243 .  ferric citrate (AURYXIA) tablet 420 mg, 420 mg, Oral, TID WC, Madelon Lips, MD, 420 mg at 11/20/16 0825 .  gentamicin cream (GARAMYCIN) 0.1 % 1 application, 1 application, Topical, Daily, Roney Jaffe, MD, 1 application at 47/09/62 1048 .  Gerhardt's butt cream, , Topical, PRN, Rai, Ripudeep K, MD .  gi cocktail (Maalox,Lidocaine,Donnatal), 30 mL, Oral, TID PRN, Rai, Ripudeep K, MD .  heparin 2,500 Units in dialysis solution 1.5% low-MG/low-CA 5,000 mL dialysis solution, , Peritoneal Dialysis, PRN, Jaquita Folds, RPH .  heparin 2,500 Units in dialysis solution 2.5% low-MG/low-CA 5,000 mL dialysis solution, , Peritoneal Dialysis, PRN, Roney Jaffe, MD .  heparin injection 5,000 Units, 5,000 Units, Subcutaneous, Q8H, Rhyne, Samantha J, PA-C, 5,000 Units at 11/18/16 8366 .  hydrALAZINE (APRESOLINE) tablet 75 mg, 75 mg, Oral, Q8H, Oswald Hillock, MD, 75 mg at 11/19/16 2124 .  HYDROcodone-acetaminophen (NORCO/VICODIN) 5-325 MG per tablet 1-2 tablet, 1-2 tablet, Oral, Q6H PRN, Ollis, Brandi L, NP, 2 tablet at 11/20/16 0946 .  insulin aspart (novoLOG) injection 0-9 Units, 0-9 Units, Subcutaneous, TID WC, Lama, Gagan S, MD .  isosorbide dinitrate (ISORDIL) tablet 5 mg, 5 mg, Oral, BID, Jennelle Human B, NP, 5 mg at 11/20/16 1035 .  labetalol  (NORMODYNE) tablet 300 mg, 300 mg, Oral, BID, Roney Jaffe, MD, 300 mg at 11/20/16 1034 .  LORazepam (ATIVAN) tablet 1 mg, 1 mg, Oral, Q6H PRN, Oswald Hillock, MD, 1 mg at 11/19/16 2125 .  MEDLINE mouth rinse, 15 mL, Mouth Rinse, BID, Ollis, Brandi L, NP, 15 mL at 11/18/16 2124 .  multivitamin (RENA-VIT) tablet 1 tablet, 1 tablet, Oral, QHS, Lynnda Child, PA-C, 1 tablet at 11/19/16 2124 .  pantoprazole (PROTONIX) EC tablet 40 mg, 40 mg, Oral, Daily, Rai, Ripudeep K, MD, 40 mg at 11/20/16 0824 .  pravastatin (PRAVACHOL) tablet 20 mg, 20 mg, Oral, Daily, Javier Glazier, MD, 20 mg at 11/20/16 1034 .  prochlorperazine (COMPAZINE) injection 10 mg, 10 mg, Intravenous, Q6H PRN, Eugenie Filler, MD .  saccharomyces boulardii (FLORASTOR) capsule 250 mg, 250 mg, Oral, BID, de Dios, [place], MD, 250 mg at 11/20/16 1034  Patients Current Diet: Diet renal with fluid restriction Fluid restriction: 1200 mL Fluid; Room service appropriate? Yes; Fluid consistency: Thin  Precautions / Restrictions Precautions Precautions: Fall Precaution Comments: Pt confused and delusional at times  Restrictions Weight Bearing Restrictions: No   Has the patient had 2 or more falls or a fall with injury in the past year?No  Prior Activity  Level Limited Community (1-2x/wk): Pt. reports he goes out of the home at least 2x/week for errands and out to eat  Development worker, international aid / [place] Devices/Equipment: Scales, Environmental consultant (specify type), Eyeglasses, Cane (specify quad or straight), Hand-held shower hose Home Equipment: Cane - single point, Grab bars - tub/shower, Hand held shower head  Prior Device Use: Indicate devices/aids used by the patient prior to current illness, exacerbation or injury? None of the above  Prior Functional Level Prior Function Level of Independence: Independent (does not drive)  Self Care: Did the patient need help bathing, dressing, using the toilet  or eating?  Independent  Indoor Mobility: Did the patient need assistance with walking from room to room (with or without device)? Independent  Stairs: Did the patient need assistance with internal or external stairs (with or without device)? Independent  Functional Cognition: Did the patient need help planning regular tasks such as shopping or remembering to take medications? Independent  Current Functional Level Cognition  Overall Cognitive Status: Impaired/Different from baseline Current Attention Level: Sustained Orientation Level: Oriented to person, Oriented to place Following Commands: Follows one step commands consistently Safety/Judgement: Decreased awareness of safety, Decreased awareness of deficits General Comments: Overall appropriate with communication/conversation today; decr safety awareness as he states he will get up when he wants to by himself; Tangential in conversation at times; emotionally labile (cried that his kids have not visited) pt reports "are you the ones i have been giving a hard time today" pt unaware that therapist are new to his day    Extremity Assessment (includes Sensation/Coordination)  Upper Extremity Assessment: Generalized weakness  Lower Extremity Assessment: Defer to PT evaluation RLE Deficits / Details: incision in groin, medial knee and foot wrapped with bandage.  moves well in bed, but pain with standing. moving RLE: Unable to fully assess due to pain    ADLs  Overall ADL's : Needs assistance/impaired Eating/Feeding: Set up, Sitting Grooming: Wash/dry hands, Wash/dry face, Oral care, Set up, Sitting Upper Body Bathing: Moderate assistance, Sitting Upper Body Bathing Details (indicate cue type and reason): Assisted nsg with bathing pt back  Lower Body Bathing: Moderate assistance, Sit to/from stand Lower Body Bathing Details (indicate cue type and reason): Assisted RN with bathing peri area  Upper Body Dressing : Moderate  assistance, Sitting Lower Body Dressing: Moderate assistance Lower Body Dressing Details (indicate cue type and reason): pt able to don L sock and total (A) fo rR sock. Pt reluctant to put on sock initially  Toilet Transfer: Minimal assistance, +2 for physical assistance, Stand-pivot, RW Toilet Transfer Details (indicate cue type and reason): declined at this time. reports no needs Toileting- Water quality scientist and Hygiene: Maximal assistance, Sit to/from stand Functional mobility during ADLs: Minimal assistance, +2 for physical assistance, Rolling walker General ADL Comments: Pt internally distracted and needed return to sitting. Pt verbalized no desire to proceed at [facility] stime.    Mobility  Overal bed mobility: Needs Assistance Bed Mobility: Supine to Sit, Sit to Supine Rolling: Supervision Supine to sit: Supervision Sit to supine: Supervision General bed mobility comments: in chair on arrival    Transfers  Overall transfer level: Needs assistance Equipment used: Rolling walker (2 wheeled) Transfer via Lift Equipment: Stedy Transfers: Sit to/from Stand Sit to Stand: Mod assist, +2 safety/equipment Stand pivot transfers: Min guard, +2 physical assistance General transfer comment: Cues for hand placement and safety; Light mod assist to power up and steady    Ambulation / Gait / Stairs / Wheelchair  Mobility  Ambulation/Gait Ambulation/Gait assistance: Min guard, +2 safety/equipment Ambulation Distance (Feet): 4 Feet Assistive device: Rolling walker (2 wheeled) Gait Pattern/deviations:  (Hop-to) General Gait Details: Took a few steps, largely keeping NWB RLE; Overall good control of RW, and good support with UEs on RW Gait velocity: decr Gait velocity interpretation: Below normal speed for age/gender    Posture / Balance Dynamic Sitting Balance Sitting balance - Comments: requires UE support  Balance Overall balance assessment: Needs assistance Sitting-balance support:  Feet supported Sitting balance-Leahy Scale: Good Sitting balance - Comments: requires UE support  Standing balance support: During functional activity Standing balance-Leahy Scale: Poor Standing balance comment: requires bil. UE support and close guard; tried to be able to touch R heel to ground, but ultimately unable    Special needs/care consideration BiPAP/CPAP    no CPM  no Continuous Drip IV   no Dialysis   Yes, PD         Life Vest     no Oxygen   no Special Bed   no Trach Size   no Wound Vac (area)   no      Skin   Surgical wounds right foot and leg with dressings intact                             Bowel mgmt: 11/20/16, continent Bladder mgmt: continent, urinal Diabetic mgmt not on diabetes meds at home     Previous Home Environment Living Arrangements: Spouse/significant other Available Help at Discharge: Family, Available PRN/intermittently (wife works swing shift) Type of Home: Mobile home Home Layout: One level Home Access: Stairs to enter Entrance Stairs-Rails: Right, Left Entrance Stairs-Number of Steps: 4 Bathroom Shower/Tub: Gaffer, Charity fundraiser: Standard Home Care Services: No Additional Comments: patient unable to relate prior level/home info, above taken from admission last year.   Discharge Living Setting Plans for Discharge Living Setting: Patient's home Type of Home at Discharge: Mobile home Discharge Home Layout: One level Discharge Home Access: Stairs to enter Entrance Stairs-Rails: Right, Left Entrance Stairs-Number of Steps: 3-4 Discharge Bathroom Shower/Tub: Tub only, Walk-in shower Discharge Bathroom Toilet: Standard Discharge Bathroom Accessibility: Yes How Accessible: Accessible via walker Does the patient have any problems obtaining your medications?: No  Social/Family/Support Systems Patient Roles: Spouse, Parent Anticipated Caregiver: wife, Shadrack Brummitt Anticipated Caregiver's Contact Information: Vyncent Overby,  [phone] (cell) Ability/Limitations of Caregiver: Maudie Mercury works full time; pt. and wife have 2 working children in the home who will be with pt. all except one hour when wife is at work Careers adviser: Other (Comment) (23/24 hours per day) Discharge Plan Discussed with Primary Caregiver: Yes Is Caregiver In Agreement with Plan?: Yes Does Caregiver/Family have Issues with Lodging/Transportation while Pt is in Rehab?: No   Goals/Additional Needs Patient/Family Goal for Rehab: supervision PT/OT/SLP Expected length of stay: 10-14 days Cultural Considerations: n/a Dietary Needs: renal diet, fluid restrictions, thin liquids Equipment Needs: TBA Special Service Needs: pt. is a peritoneal dialysis patient.  I have arranged with HD to have pt. removed from PD by 8am each morning Pt/Family Agrees to Admission and willing to participate: Yes Program Orientation Provided & Reviewed with Pt/Caregiver Including Roles  & Responsibilities: Yes   Decrease burden of Care through IP rehab admission: n/a   Possible need for SNF placement upon discharge:  Not anticipated   Patient Condition: This patient's medical and functional status has changed since the consult dated 11/12/16  in which the  Rehabilitation Physician determined and documented that the patient was potentially appropriate for intensive rehabilitative care in an inpatient rehabilitation facility. Pt. Now able to tolerate 3 hours of therapies.  Issues have been addressed and update has been discussed with Dr. Posey Pronto and patient now appropriate for inpatient rehabilitation. Will admit to inpatient rehab today.   Preadmission Screen Completed By:  Gerlean Ren, 11/20/2016 12:53 PM ______________________________________________________________________   Discussed status with Dr. Posey Pronto on 5.23.18 at  1307  and received telephone approval for admission today.  Admission Coordinator:  Gerlean Ren, time 1307 Sudie Grumbling 11/20/16         Cosigned by: [name], MD at 11/20/2016 1:15 PM  Revision History

## 2016-11-20 NOTE — Progress Notes (Signed)
Hayden Arn, MD Physician Signed Physical Medicine and Rehabilitation  Consult Note Date of Service: 11/12/2016 6:28 AM  Related encounter: ED to Hosp-Admission (Discharged) from 10/23/2016 in [place] All Collapse All   [] Hide copied text [] Hover for attribution information      Physical Medicine and Rehabilitation Consult Reason for Consult: Decreased functional mobility Referring Physician: [facility]   HPI: Hayden [name] is a 46 y.o. 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 Per chart review and wife, patient lives with his spouse was independent with a cane prior to admission. Mobile home with 4 steps to entry. Wife works a swing shift. Presented 10/23/2016 with right foot redness and pain. Initial injury happened  when he caught his foot and the track of the backseat of his sisters minivan. He initially tried to keep the area clean and dry. He was given a dose of vancomycin in the emergency room but left AMA. Findings a WBC elevated 14,600, lactic acid 0.73. X-rays of the right foot show no acute abnormalities. He was placed on broad-spectrum antibiotics. 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 The anterior tibial artery appears occluded. Underwent angioplasty right superficial femoral artery per vascular surgery 11/05/2016 per Dr. Bridgett Larsson after receiving cardiac clearance as well as right first digit amputation and dorsal ulcer debridement. Hospital course neurology consulted for altered mental status and questionable seizure EEG showed nonspecific cerebral dysfunction and encephalopathy no seizure activity noted and maintained on Depakote. Patient did require short-term intubation  extubated 11/06/2016. Dialysis ongoing as per renal services. Patient with intermittent bouts of agitation and restlessness. Subcutaneous heparin for DVT prophylaxis. Physical therapy evaluation completed with recommendations of physical medicine rehabilitation consult.   Review of Systems  Unable to perform ROS: Acuity of condition       Past Medical History:  Diagnosis Date  . Anemia March 2014  . Cancer ([place])    Kidney  . CHF (congestive heart failure) ([place]) 7829   Acute systolic and diastolic CHF  . Depression    & rage --  was in counseling....great now  . Diabetes mellitus    NO DM SINCE LOST 130LBS  . ESRD on peritoneal dialysis ([place]) 2018   Started in-center HD approx 2015 for 2 years, then did about 1 year of home HD and then started peritoneal dialysis in early 2018.    Marland Kitchen History of kidney cancer   . Hyperlipidemia   . Hypertension   . Obesity   . PONV (postoperative nausea and vomiting)   . Stroke [facility])         Past Surgical History:  Procedure Laterality Date  . ABDOMINAL AORTOGRAM W/LOWER EXTREMITY N/A 10/28/2016   Procedure: Abdominal Aortogram w/Lower Extremity;  Surgeon: Angelia Mould, MD;  Location: [place] [facility];  Service: Cardiovascular;  Laterality: N/A;  . AMPUTATION Right 11/05/2016   Procedure: AMPUTATION RIGHT FIRST RAY;  Surgeon: Conrad Kasson, MD;  Location: [facility] OR;  Service: Vascular;  Laterality: Right;  . AV FISTULA PLACEMENT Left 01/26/2013   Procedure: ARTERIOVENOUS (AV) FISTULA CREATION - LEFT RADIAL CEPHALIC AVF;  Surgeon: Angelia Mould, MD;  Location: [place];  Service: Vascular;  Laterality: Left;  . CHOLECYSTECTOMY  11/06/2015   Procedure: LAPAROSCOPIC CHOLECYSTECTOMY;  Surgeon: Ralene Ok, MD;  Location: [place];  Service: General;;  . FEMORAL-POPLITEAL BYPASS GRAFT Right 11/05/2016   Procedure: BYPASS GRAFT FEMORAL-POPLITEAL ARTERY USING NON-REVERSED RIGHT GREATER SAPPHENOUS VEIN;  Surgeon: Conrad [place], MD;  Location: [place];  Service: Vascular;  Laterality: Right;  . HERNIA REPAIR    . LOWER EXTREMITY ANGIOGRAM Right 10/30/2016   Procedure: Right  LOWER EXTREMITY ANGIOGRAM WITH RIGHT SUPERFICIAL FEMORAL ARTERY balloon angioplasty;  Surgeon: Conrad Stonybrook, MD;  Location: [place];  Service: Vascular;  Laterality: Right;  . NEPHRECTOMY Right 2008   partial  . TESTICLE TORSION REDUCTION    . TONSILLECTOMY AND ADENOIDECTOMY    . VEIN HARVEST Right 11/05/2016   Procedure: RIGHT GREATER SAPPHENOUS VEIN HARVEST;  Surgeon: Conrad Bodcaw, MD;  Location: [facility] OR;  Service: Vascular;  Laterality: Right;        Family History  Problem Relation Age of Onset  . Heart disease Mother        Heart Disease before age 60  . Deep vein thrombosis Father   . Heart attack Father   . Other Other    Social History:  reports that he has been smoking Cigarettes.  He has a 2.50 pack-year smoking history. He has never used smokeless tobacco. He reports that he uses drugs, including Marijuana. He reports that he does not drink alcohol. Allergies:       Allergies  Allergen Reactions  . Dilaudid [Hydromorphone Hcl] Other (See Comments)    ABNORMAL BEHAVIORS "VERBALLY AND PHYSICALLY ABUSIVE" PSYCHOSIS  . Morphine And Related Itching and Other (See Comments)    SYNCOPE         Medications Prior to Admission  Medication Sig Dispense Refill  . ALPRAZolam (XANAX) 0.5 MG tablet Take 0.5-1 mg by mouth 2 (two) times daily as needed for anxiety.     . ALPRAZolam (XANAX) 1 MG tablet Take 1 mg by mouth at bedtime as needed for anxiety.    Marland Kitchen amLODipine (NORVASC) 10 MG tablet Take 10 mg by mouth daily.    Marland Kitchen aspirin EC 81 MG tablet Take 81 mg by mouth daily.    Marland Kitchen buPROPion (WELLBUTRIN XL) 150 MG 24 hr tablet Take 150-300 mg by mouth daily. 150 mg in the morning, 300 mg at bedtime    . cinacalcet (SENSIPAR) 60 MG tablet Take 60 mg by mouth daily.    . clindamycin (CLEOCIN) 300 MG capsule Take  300 mg by mouth 4 (four) times daily.    . cloNIDine (CATAPRES - DOSED IN MG/24 HR) 0.2 mg/24hr patch Place 1 patch onto the skin once a week.    . hydrALAZINE (APRESOLINE) 100 MG tablet Take 100 mg by mouth 2 (two) times daily.    . isosorbide dinitrate (ISORDIL) 10 MG tablet Take 10 mg by mouth daily.    Marland Kitchen labetalol (NORMODYNE) 100 MG tablet Take 150 mg by mouth 2 (two) times daily.     Marland Kitchen omeprazole (PRILOSEC) 40 MG capsule Take 40 mg by mouth daily.    Marland Kitchen PARoxetine (PAXIL) 20 MG tablet Take 40 mg by mouth daily.     . pravastatin (PRAVACHOL) 20 MG tablet Take 20 mg by mouth daily.    . Vitamin D, Ergocalciferol, (DRISDOL) 50000 units CAPS capsule Take 50,000 Units by mouth every 7 (seven) days. Every 7 days; on Thursday    . buPROPion (WELLBUTRIN SR) 150 MG 12 hr tablet Take 150-300 mg by mouth See admin instructions. 150 mg in the  morning and 300 mg at bedtime    . calcium acetate (PHOSLO) 667 MG capsule Take 2 capsules (1,334 mg total) by mouth 3 (three) times daily with meals. (Patient not taking: Reported on 10/24/2016) 120 capsule 0  . furosemide (LASIX) 80 MG tablet Take 80 mg by mouth 2 (two) times daily.    Marland Kitchen saccharomyces boulardii (FLORASTOR) 250 MG capsule Take 1 capsule (250 mg total) by mouth 2 (two) times daily. (Patient not taking: Reported on 10/24/2016) 30 capsule 0    Home: Home Living Family/patient expects to be discharged to:: Private residence Living Arrangements: Spouse/significant other Available Help at Discharge: Family, Available PRN/intermittently (wife works swing shift) Type of Home: Mobile home Home Access: Stairs to enter Technical brewer of Steps: 4 Entrance Stairs-Rails: Right, Left Home Layout: One level Bathroom Shower/Tub: Gaffer, Door [employer] Toilet: Standard Home Equipment: Cane - single point, Grab bars - tub/shower, Hand held shower head Additional Comments: patient unable to relate prior level/home info,  above taken from admission last year.   Functional History: Prior Function Level of Independence: Independent Functional Status:  Mobility: Bed Mobility Overal bed mobility: Needs Assistance Bed Mobility: Supine to Sit Rolling: Min assist Supine to sit: +2 for physical assistance, HOB elevated, Mod assist Sit to supine: Mod assist General bed mobility comments: Pt determined to attempt himself, initially would not allow therapist to assist.  He did require assist to lift trunk and to scoot hips to EOB  Transfers Overall transfer level: Needs assistance Equipment used: Ambulation equipment used Transfer via Lift Equipment: Stedy Transfers: Sit to/from Stand, [employer] Transfers Sit to Stand: Mod assist, +2 physical assistance Stand pivot transfers: Mod assist, +2 physical assistance General transfer comment: Pt required an extensive amount of time to coax into transferring.  He initially attempted to stand, but was unable.  Pt delusional and at times hallucinating, with speech at times nonsensical.  Once it was determined that pain was limiting his mobility, the RN provided pain meds, and pt then able to assist with sit to stand - requires assist to boost into standing and assist for balance.  Ambulation/Gait Ambulation/Gait assistance: +2 physical assistance, Mod assist Ambulation Distance (Feet): 2 Feet Assistive device: Rolling walker (2 wheeled) Gait Pattern/deviations: Step-to pattern, Trunk flexed (hop to) General Gait Details: Assist for pivotal steps from bed to recliner Gait velocity: decr Gait velocity interpretation: Below normal speed for age/gender  ADL: ADL Overall ADL's : Needs assistance/impaired Eating/Feeding: Set up, Sitting Grooming: Minimal assistance, Sitting Upper Body Bathing: Moderate assistance, Sitting Lower Body Bathing: Maximal assistance, Sit to/from stand Upper Body Dressing : Moderate assistance, Sitting Lower Body Dressing: Maximal assistance,  Sit to/from stand Toilet Transfer: Moderate assistance, +2 for safety/equipment, Stand-pivot (stedy ) Toilet Transfer Details (indicate cue type and reason): Simulated by stand pivot from EOB to chair Functional mobility during ADLs: Moderate assistance, +2 for physical assistance  Cognition: Cognition Overall Cognitive Status: Impaired/Different from baseline Orientation Level: Oriented to person, Disoriented to situation, Oriented to time, Disoriented to place Cognition Arousal/Alertness: Awake/alert Behavior During Therapy: Anxious, Restless Overall Cognitive Status: Impaired/Different from baseline Area of Impairment: Orientation, Attention, Following commands, Memory, Safety/judgement, Awareness, Problem solving Orientation Level: Disoriented to, Place, Time, Situation Current Attention Level: Sustained Memory: Decreased short-term memory, Decreased recall of precautions Following Commands: Follows one step commands consistently Safety/Judgement: Decreased awareness of safety, Decreased awareness of deficits Problem Solving: Slow processing, Decreased initiation, Difficulty sequencing, Requires verbal cues, Requires tactile cues General Comments: Patient not as agitated as  earlier.  Was able to follow commands for exercises.  Blood pressure (!) 158/82, pulse 75, temperature 98.5 F (36.9 C), temperature source Oral, resp. rate 16, height 5\' 10"  (1.778 m), weight 89.9 kg (198 lb 3.1 oz), SpO2 97 %. Physical Exam  Vitals reviewed. Constitutional: He appears well-developed and well-nourished.  46 year old male sitting up in bed with wife at bedside.  HENT:  Head: Normocephalic and atraumatic.  Eyes: EOM are normal. Right eye exhibits no discharge. Left eye exhibits no discharge.  Neck: Normal range of motion. Neck supple. No thyromegaly present.  Cardiovascular: Normal rate and regular rhythm.   Respiratory: Effort normal and breath sounds normal. No respiratory distress.  GI:  Soft. Bowel sounds are normal. He exhibits no distension.  Musculoskeletal: He exhibits edema and tenderness.  Amputation 1st digit RLE  Neurological: He is alert.  Patient is very anxious and distracted.  A&Ox2, confused Motor: B/l UE, LLE: 4+/5 proximal to distal RLE: 2/5 proximal to distal (pain inhibition)  Skin:  Right foot amputation vascular surgery sites clean and dry  Psychiatric: His mood appears anxious. His affect is labile. His speech is rapid and/or pressured and tangential. He is hyperactive. Thought content is delusional. Cognition and memory are impaired. He expresses impulsivity.    Lab Results Last 24 Hours       Results for orders placed or performed during the hospital encounter of 10/23/16 (from the past 24 hour(s))  Glucose, capillary     Status: Abnormal   Collection Time: 11/11/16 12:07 PM  Result Value Ref Range   Glucose-Capillary 235 (H) 65 - 99 mg/dL  Glucose, capillary     Status: Abnormal   Collection Time: 11/11/16  4:33 PM  Result Value Ref Range   Glucose-Capillary 61 (L) 65 - 99 mg/dL  Glucose, capillary     Status: None   Collection Time: 11/11/16  5:53 PM  Result Value Ref Range   Glucose-Capillary 74 65 - 99 mg/dL  Glucose, capillary     Status: Abnormal   Collection Time: 11/11/16 10:11 PM  Result Value Ref Range   Glucose-Capillary 102 (H) 65 - 99 mg/dL  Glucose, capillary     Status: Abnormal   Collection Time: 11/12/16 12:02 AM  Result Value Ref Range   Glucose-Capillary 102 (H) 65 - 99 mg/dL   Comment 1 Notify RN    Comment 2 Document in Chart   Glucose, capillary     Status: Abnormal   Collection Time: 11/12/16  4:06 AM  Result Value Ref Range   Glucose-Capillary 113 (H) 65 - 99 mg/dL   Comment 1 Notify RN    Comment 2 Document in Chart      Imaging Results (Last 48 hours)  No results found.    Assessment/Plan: Diagnosis: Right first digit amputation Labs independently reviewed.  Records reviewed  and summated above.  1. Does the need for close, 24 hr/day medical supervision in concert with the patient's rehab needs make it unreasonable for this patient to be served in a less intensive setting? Yes 2. 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 (recs per neprho), tobacco abuse (counsel when appropriate), acute on chronic congestive heart failure (Monitor in accordance with increased physical activity and avoid UE resistance excercises), HTN (monitor and provide prns in accordance with increased physical exertion and pain), CVA, encephalopathy, post-op pain (Biofeedback training with therapies to help reduce reliance on opiate pain medications, particularly IV fentanyl, monitor pain control during therapies, and sedation at rest  and titrate to maximum efficacy to ensure participation and gains in therapies), leukocytosis (cont to monitor for signs and symptoms of infection, further workup if indicated) 3. Due to bladder management, bowel management, safety, skin/wound care, disease management, medication administration, pain management and patient education, does the patient require 24 hr/day rehab nursing? Yes 4. Does the patient require coordinated care of a physician, rehab nurse, PT (1-2 hrs/day, 5 days/week), OT (1-2 hrs/day, 5 days/week) and SLP (1-2 hrs/day, 5 days/week) to address physical and functional deficits in the context of the above medical diagnosis(es)? Yes Addressing deficits in the following areas: balance, endurance, locomotion, strength, transferring, bowel/bladder control, bathing, dressing, toileting, cognition and psychosocial support 5. Can the patient actively participate in an intensive therapy program of at least 3 hrs of therapy per day at least 5 days per week? Potentially 6. The potential for patient to make measurable gains while on inpatient rehab is excellent 7. Anticipated functional outcomes upon discharge from  inpatient rehab are supervision  with PT, supervision with OT, supervision and min assist with SLP. 8. Estimated rehab length of stay to reach the above functional goals is: 10-14 days. 9. Anticipated D/C setting: Home 10. Anticipated post D/C treatments: HH therapy and Home excercise program 11. Overall Rehab/Functional Prognosis: good  RECOMMENDATIONS: This patient's condition is appropriate for continued rehabilitative care in the following setting: Will await final plans for surgical intervention as well as improvement in mental status.  Recommend weaning cognitively altering meds as tolerated.  If patient continues to have funcional deficits, recommend CIR. Patient has agreed to participate in recommended program. Potentially Note that insurance prior authorization may be required for reimbursement for recommended care.  Comment: Rehab Admissions Coordinator to follow up.  Delice Lesch, MD, Mellody Drown Cathlyn Parsons., PA-C 11/12/2016    Revision History                        Routing History

## 2016-11-20 NOTE — H&P (Signed)
Physical Medicine and Rehabilitation Admission H&P    Chief Complaint  Patient presents with  . Foot Injury  : HPI: Hayden [name] is a 46 y.o. 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 Per chart review and patient lives with his spouse was independent with a cane prior to admission. Mobile home with 4 steps to entry. Wife works a swing shift. Presented 10/23/2016 with right foot redness and pain. Initial injury happened  when he caught his foot and the track of the backseat of his sisters minivan. He initially tried to keep the area clean and dry. He was given a dose of vancomycin in the emergency room but left AMA. Findings a WBC elevated 14,600, lactic acid 0.73. X-rays of the right foot show no acute abnormalities. He was placed on broad-spectrum antibiotics. 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 The anterior tibial artery appears occluded. Underwent angioplasty right superficial femoral artery per vascular surgery 11/05/2016 per Dr. Bridgett Larsson after receiving cardiac clearance as well as right first digit amputation and dorsal ulcer debridement. Hospital course neurology consulted for altered mental status and questionable seizure EEG showed nonspecific cerebral dysfunction and encephalopathy no seizure activity noted and maintained on Depakote. Patient did require short-term intubation extubated 11/06/2016. Dialysis ongoing as per renal services. Patient with intermittent bouts of agitation and restlessness. Cognition improving. Subcutaneous heparin for DVT prophylaxis. Physical therapy evaluation completed with recommendations of physical medicine rehabilitation consult.Patient was admitted for a comprehensive rehabilitation program  Review  of Systems  Constitutional: Negative for chills and fever.  HENT: Negative for hearing loss.   Eyes: Negative for blurred vision and double vision.  Respiratory: Positive for shortness of breath. Negative for cough.   Cardiovascular: Positive for palpitations and leg swelling. Negative for chest pain.  Gastrointestinal: Positive for constipation. Negative for nausea and vomiting.  Genitourinary: Negative for flank pain.  Musculoskeletal: Positive for joint pain and myalgias.  Skin: Negative for rash.  Neurological: Positive for weakness. Negative for seizures.  Psychiatric/Behavioral: Positive for depression.  All other systems reviewed and are negative.  Past Medical History:  Diagnosis Date  . Anemia March 2014  . Cancer ([place])    Kidney  . CHF (congestive heart failure) ([place]) 8502   Acute systolic and diastolic CHF  . Depression    & rage --  was in counseling....great now  . Diabetes mellitus    NO DM SINCE LOST 130LBS  . ESRD on peritoneal dialysis ([place]) 2018   Started in-center HD approx 2015 for 2 years, then did about 1 year of home HD and then started peritoneal dialysis in early 2018.    Marland Kitchen History of kidney cancer   . Hyperlipidemia   . Hypertension   . Obesity   . PONV (postoperative nausea and vomiting)   . Stroke [facility])    Past Surgical History:  Procedure Laterality Date  . ABDOMINAL AORTOGRAM W/LOWER EXTREMITY N/A 10/28/2016   Procedure: Abdominal Aortogram w/Lower Extremity;  Surgeon: Angelia Mould, MD;  Location: [place] CV LAB;  Service: Cardiovascular;  Laterality: N/A;  . AMPUTATION Right 11/05/2016   Procedure: AMPUTATION RIGHT FIRST RAY;  Surgeon: Conrad Crawford, MD;  Location: [place];  Service: Vascular;  Laterality: Right;  . AV FISTULA PLACEMENT Left 01/26/2013   Procedure: ARTERIOVENOUS (  AV) FISTULA CREATION - LEFT RADIAL CEPHALIC AVF;  Surgeon: Angelia Mould, MD;  Location: [place];  Service: Vascular;  Laterality: Left;  .  CHOLECYSTECTOMY  11/06/2015   Procedure: LAPAROSCOPIC CHOLECYSTECTOMY;  Surgeon: Ralene Ok, MD;  Location: [place];  Service: General;;  . FEMORAL-POPLITEAL BYPASS GRAFT Right 11/05/2016   Procedure: BYPASS GRAFT FEMORAL-POPLITEAL ARTERY USING NON-REVERSED RIGHT GREATER SAPPHENOUS VEIN;  Surgeon: Conrad Ames, MD;  Location: [place];  Service: Vascular;  Laterality: Right;  . HERNIA REPAIR    . LOWER EXTREMITY ANGIOGRAM Right 10/30/2016   Procedure: Right  LOWER EXTREMITY ANGIOGRAM WITH RIGHT SUPERFICIAL FEMORAL ARTERY balloon angioplasty;  Surgeon: Conrad Portsmouth, MD;  Location: [place];  Service: Vascular;  Laterality: Right;  . NEPHRECTOMY Right 2008   partial  . TESTICLE TORSION REDUCTION    . TONSILLECTOMY AND ADENOIDECTOMY    . VEIN HARVEST Right 11/05/2016   Procedure: RIGHT GREATER SAPPHENOUS VEIN HARVEST;  Surgeon: Conrad Lincolnton, MD;  Location: [facility] OR;  Service: Vascular;  Laterality: Right;   Family History  Problem Relation Age of Onset  . Heart disease Mother        Heart Disease before age 73  . Deep vein thrombosis Father   . Heart attack Father   . Other Other    Social History:  reports that he has been smoking Cigarettes.  He has a 2.50 pack-year smoking history. He has never used smokeless tobacco. He reports that he uses drugs, including Marijuana. He reports that he does not drink alcohol. Allergies:  Allergies  Allergen Reactions  . Dilaudid [Hydromorphone Hcl] Other (See Comments)    ABNORMAL BEHAVIORS "VERBALLY AND PHYSICALLY ABUSIVE" PSYCHOSIS  . Morphine And Related Itching and Other (See Comments)    SYNCOPE   Medications Prior to Admission  Medication Sig Dispense Refill  . ALPRAZolam (XANAX) 1 MG tablet Take 1 mg by mouth daily as needed for anxiety.     Marland Kitchen amLODipine (NORVASC) 10 MG tablet Take 10 mg by mouth daily.    Marland Kitchen aspirin EC 81 MG tablet Take 81 mg by mouth daily.    Marland Kitchen buPROPion (WELLBUTRIN XL) 150 MG 24 hr tablet Take 150-300 mg by mouth daily. 150 mg  in the morning, 300 mg at bedtime    . cinacalcet (SENSIPAR) 60 MG tablet Take 60 mg by mouth daily.    . clindamycin (CLEOCIN) 300 MG capsule Take 300 mg by mouth 4 (four) times daily.    . cloNIDine (CATAPRES - DOSED IN MG/24 HR) 0.2 mg/24hr patch Place 1 patch onto the skin once a week.    . hydrALAZINE (APRESOLINE) 100 MG tablet Take 100 mg by mouth 2 (two) times daily.    . isosorbide dinitrate (ISORDIL) 10 MG tablet Take 10 mg by mouth daily.    Marland Kitchen labetalol (NORMODYNE) 100 MG tablet Take 150 mg by mouth 2 (two) times daily.     Marland Kitchen omeprazole (PRILOSEC) 40 MG capsule Take 40 mg by mouth daily.    Marland Kitchen PARoxetine (PAXIL) 20 MG tablet Take 40 mg by mouth daily.     . pravastatin (PRAVACHOL) 20 MG tablet Take 20 mg by mouth daily.    . temazepam (RESTORIL) 15 MG capsule Take 15 mg by mouth at bedtime as needed for sleep.    . Vitamin D, Ergocalciferol, (DRISDOL) 50000 units CAPS capsule Take 50,000 Units by mouth every 7 (seven) days. Every 7 days; on Thursday    . buPROPion [facility] SR) 150  MG 12 hr tablet Take 150-300 mg by mouth See admin instructions. 150 mg in the morning and 300 mg at bedtime    . furosemide (LASIX) 80 MG tablet Take 80 mg by mouth 2 (two) times daily.    Marland Kitchen saccharomyces boulardii (FLORASTOR) 250 MG capsule Take 1 capsule (250 mg total) by mouth 2 (two) times daily. (Patient not taking: Reported on 10/24/2016) 30 capsule 0    Home: Home Living Family/patient expects to be discharged to:: Private residence Living Arrangements: Spouse/significant other Available Help at Discharge: Family, Available PRN/intermittently (wife works swing shift) Type of Home: Mobile home Home Access: Stairs to enter Technical brewer of Steps: 4 Entrance Stairs-Rails: Right, Left Home Layout: One level Bathroom Shower/Tub: Gaffer, Door [employer] Toilet: Standard Home Equipment: Cane - single point, Grab bars - tub/shower, Hand held shower head Additional Comments: patient  unable to relate prior level/home info, above taken from admission last year.    Functional History: Prior Function Level of Independence: Independent  Functional Status:  Mobility: Bed Mobility Overal bed mobility: Needs Assistance Bed Mobility: Supine to Sit, Sit to Supine Rolling: Supervision Supine to sit: Supervision Sit to supine: Supervision General bed mobility comments: in chair on arrival Transfers Overall transfer level: Needs assistance Equipment used: Rolling walker (2 wheeled) Transfer via Lift Equipment: Stedy Transfers: Sit to/from Stand Sit to Stand: Mod assist, +2 safety/equipment Stand pivot transfers: Min guard, +2 physical assistance General transfer comment: Cues for hand placement and safety; Light mod assist to power up and steady Ambulation/Gait Ambulation/Gait assistance: Min guard, +2 safety/equipment Ambulation Distance (Feet): 4 Feet Assistive device: Rolling walker (2 wheeled) Gait Pattern/deviations:  (Hop-to) General Gait Details: Took a few steps, largely keeping NWB RLE; Overall good control of RW, and good support with UEs on RW Gait velocity: decr Gait velocity interpretation: Below normal speed for age/gender    ADL: ADL Overall ADL's : Needs assistance/impaired Eating/Feeding: Set up, Sitting Grooming: Wash/dry hands, Wash/dry face, Oral care, Set up, Sitting Upper Body Bathing: Moderate assistance, Sitting Upper Body Bathing Details (indicate cue type and reason): Assisted nsg with bathing pt back  Lower Body Bathing: Moderate assistance, Sit to/from stand Lower Body Bathing Details (indicate cue type and reason): Assisted RN with bathing peri area  Upper Body Dressing : Moderate assistance, Sitting Lower Body Dressing: Moderate assistance Lower Body Dressing Details (indicate cue type and reason): pt able to don L sock and total (A) fo rR sock. Pt reluctant to put on sock initially  Toilet Transfer: Minimal assistance, +2 for  physical assistance, Stand-pivot, RW Toilet Transfer Details (indicate cue type and reason): declined at this time. reports no needs Toileting- Water quality scientist and Hygiene: Maximal assistance, Sit to/from stand Functional mobility during ADLs: Minimal assistance, +2 for physical assistance, Rolling walker General ADL Comments: Pt internally distracted and needed return to sitting. Pt verbalized no desire to proceed at [facility] stime.  Cognition: Cognition Overall Cognitive Status: Impaired/Different from baseline Orientation Level: Oriented to person, Oriented to place Cognition Arousal/Alertness: Awake/alert Behavior During Therapy: Anxious, Restless, Impulsive (but able to engage in conversation) Overall Cognitive Status: Impaired/Different from baseline Area of Impairment: Orientation, Attention, Memory, Following commands, Safety/judgement, Awareness, Problem solving Orientation Level: Disoriented to, Time, Place, Situation Current Attention Level: Sustained Memory: Decreased short-term memory Following Commands: Follows one step commands consistently Safety/Judgement: Decreased awareness of safety, Decreased awareness of deficits Awareness: Intellectual Problem Solving: Slow processing, Requires verbal cues, Requires tactile cues General Comments: Overall appropriate with communication/conversation today; decr safety  awareness as he states he will get up when he wants to by himself; Tangential in conversation at times; emotionally labile (cried that his kids have not visited) pt reports "are you the ones i have been giving a hard time today" pt unaware that therapist are new to his day  Physical Exam: Blood pressure (!) 150/91, pulse 99, temperature 98 F (36.7 C), temperature source Oral, resp. rate 18, height 5' 10"  (1.778 m), weight 84.8 kg (187 lb), SpO2 97 %. Physical Exam  Constitutional: He appears well-developed and well-nourished.  HENT:  Head: Normocephalic and  atraumatic.  Eyes: Conjunctivae and EOM are normal.  Neck: Normal range of motion. Neck supple. No thyromegaly present.  Cardiovascular: Normal rate, regular rhythm and normal heart sounds.   Respiratory: Effort normal and breath sounds normal. No respiratory distress.  GI: Soft. Bowel sounds are normal. He exhibits no distension.  Musculoskeletal: He exhibits edema and tenderness.  Neurological: He is alert.  Patient is very anxious and distracted/pleasantly confused.  A&Ox2, confused Motor: B/l UE, LLE: 5/5 proximal to distal RLE: HF 4/5, KE 4/5.  Wiggles toes Sensation intact to light touch  Skin:  Dressing to RLE c/d/i  Psychiatric: His mood appears anxious. His affect is labile. His speech is rapid and/or pressured. He is slowed. Cognition and memory are impaired. He expresses impulsivity.     Results for orders placed or performed during the hospital encounter of 10/23/16 (from the past 48 hour(s))  Glucose, capillary     Status: Abnormal   Collection Time: 11/18/16  4:58 PM  Result Value Ref Range   Glucose-Capillary 110 (H) 65 - 99 mg/dL  Glucose, capillary     Status: None   Collection Time: 11/18/16  9:31 PM  Result Value Ref Range   Glucose-Capillary 99 65 - 99 mg/dL  Renal function panel     Status: Abnormal   Collection Time: 11/19/16  7:49 AM  Result Value Ref Range   Sodium 137 135 - 145 mmol/L   Potassium 3.0 (L) 3.5 - 5.1 mmol/L   Chloride 98 (L) 101 - 111 mmol/L   CO2 27 22 - 32 mmol/L   Glucose, Bld 115 (H) 65 - 99 mg/dL   BUN 42 (H) 6 - 20 mg/dL   Creatinine, Ser 7.39 (H) 0.61 - 1.24 mg/dL   Calcium 8.7 (L) 8.9 - 10.3 mg/dL   Phosphorus 4.5 2.5 - 4.6 mg/dL   Albumin 1.7 (L) 3.5 - 5.0 g/dL   GFR calc non Af Amer 8 (L) >60 mL/min   GFR calc Af Amer 9 (L) >60 mL/min    Comment: (NOTE) The eGFR has been calculated using the CKD EPI equation. This calculation has not been validated in all clinical situations. eGFR's persistently <60 mL/min signify possible  Chronic Kidney Disease.    Anion gap 12 5 - 15  CBC     Status: Abnormal   Collection Time: 11/19/16  7:49 AM  Result Value Ref Range   WBC 13.6 (H) 4.0 - 10.5 K/uL   RBC 3.64 (L) 4.22 - 5.81 MIL/uL   Hemoglobin 9.3 (L) 13.0 - 17.0 g/dL   HCT 30.6 (L) 39.0 - 52.0 %   MCV 84.1 78.0 - 100.0 fL   MCH 25.5 (L) 26.0 - 34.0 pg   MCHC 30.4 30.0 - 36.0 g/dL   RDW 22.1 (H) 11.5 - 15.5 %   Platelets 599 (H) 150 - 400 K/uL  Glucose, capillary     Status: Abnormal  Collection Time: 11/19/16  8:21 AM  Result Value Ref Range   Glucose-Capillary 143 (H) 65 - 99 mg/dL  Glucose, capillary     Status: Abnormal   Collection Time: 11/19/16 12:38 PM  Result Value Ref Range   Glucose-Capillary 115 (H) 65 - 99 mg/dL  Glucose, capillary     Status: Abnormal   Collection Time: 11/19/16  4:28 PM  Result Value Ref Range   Glucose-Capillary 107 (H) 65 - 99 mg/dL  Glucose, capillary     Status: None   Collection Time: 11/19/16  9:04 PM  Result Value Ref Range   Glucose-Capillary 97 65 - 99 mg/dL  Renal function panel     Status: Abnormal   Collection Time: 11/20/16  5:50 AM  Result Value Ref Range   Sodium 138 135 - 145 mmol/L   Potassium 3.3 (L) 3.5 - 5.1 mmol/L   Chloride 99 (L) 101 - 111 mmol/L   CO2 27 22 - 32 mmol/L   Glucose, Bld 109 (H) 65 - 99 mg/dL   BUN 47 (H) 6 - 20 mg/dL   Creatinine, Ser 7.65 (H) 0.61 - 1.24 mg/dL   Calcium 9.0 8.9 - 10.3 mg/dL   Phosphorus 5.0 (H) 2.5 - 4.6 mg/dL   Albumin 1.7 (L) 3.5 - 5.0 g/dL   GFR calc non Af Amer 8 (L) >60 mL/min   GFR calc Af Amer 9 (L) >60 mL/min    Comment: (NOTE) The eGFR has been calculated using the CKD EPI equation. This calculation has not been validated in all clinical situations. eGFR's persistently <60 mL/min signify possible Chronic Kidney Disease.    Anion gap 12 5 - 15  Glucose, capillary     Status: Abnormal   Collection Time: 11/20/16  7:49 AM  Result Value Ref Range   Glucose-Capillary 140 (H) 65 - 99 mg/dL  Glucose,  capillary     Status: Abnormal   Collection Time: 11/20/16 12:24 PM  Result Value Ref Range   Glucose-Capillary 113 (H) 65 - 99 mg/dL   No results found.     Medical Problem List and Plan: 1.  Debilitation secondary to right first digit amputation/acute encephalopathy/multi-medical 2.  DVT Prophylaxis/Anticoagulation: Subcutaneous heparin. Monitor for any bleeding episodes 3. Pain Management: Hydrocodone as needed 4. Mood: Lexapro 10 mg daily, Depakote 500 mg every 12 hours, Ativan as needed 5. Neuropsych: This patient is not capable of making decisions on his own behalf. 6. Skin/Wound Care: Skin care as directed 7. Fluids/Electrolytes/Nutrition: Routine I&O with follow-up chemistries 8. ESRD. Continue dialysis as per renal services 9. Hypertension. Clonidine patch 0.2 mg weekly, Isordil 5 mg twice a day, Norvasc 10 mg daily, labetalol 300 mg twice a day, hydralazine 75 mg every 8 hours. 10. Diabetes mellitus. Latest hemoglobin A1c 5.4. Check blood sugars before meals and at bedtime.SSI 11. Chronic congestive heart failure. Monitor for any signs of fluid overload 12. Acute on chronic anemia. Follow-up CBC 13. Tobacco abuse. Counseling  Post Admission Physician Evaluation: 1. Preadmission assessment reviewed and changes made below. 2. Functional deficits secondary  to encephalopathy. 3. Patient is admitted to receive collaborative, interdisciplinary care between the physiatrist, rehab nursing staff, and therapy team. 4. Patient's level of medical complexity and substantial therapy needs in context of that medical necessity cannot be provided at a lesser intensity of care such as a SNF. 5. Patient has experienced substantial functional loss from his/her baseline which was documented above under the "Functional History" and "Functional Status" headings.  Judging by  the patient's diagnosis, physical exam, and functional history, the patient has potential for functional progress which will  result in measurable gains while on inpatient rehab.  These gains will be of substantial and practical use upon discharge  in facilitating mobility and self-care at the household level. 71. Physiatrist will provide 24 hour management of medical needs as well as oversight of the therapy plan/treatment and provide guidance as appropriate regarding the interaction of the two. 7. 24 hour rehab nursing will assist with bladder management, bowel management, safety, skin/wound care, disease management, pain management and patient education  and help integrate therapy concepts, techniques,education, etc. 8. PT will assess and treat for/with: Lower extremity strength, range of motion, stamina, balance, functional mobility, safety, adaptive techniques and equipment, woundcare, coping skills, pain control, education.   Goals are: Mod I/Supervision. 9. OT will assess and treat for/with: ADL's, functional mobility, safety, upper extremity strength, adaptive techniques and equipment, wound mgt, ego support, and community reintegration.   Goals are: Mod I/Supervision. Therapy may not proceed with showering this patient. 10. SLP will assess and treat for/with: cognition.  Goals are: supervision/Mod I. 11. Case Management and Social Worker will assess and treat for psychological issues and discharge planning. 12. Team conference will be held weekly to assess progress toward goals and to determine barriers to discharge. 13. Patient will receive at least 3 hours of therapy per day at least 5 days per week. 14. ELOS: 6-9 days.       15. Prognosis:  excellent  Delice Lesch, MD, Mellody Drown Cathlyn Parsons., PA-C 11/20/2016

## 2016-11-20 NOTE — Progress Notes (Addendum)
I have a bed for Hayden [name] today.  I have spoken with Dr. Tana Coast and she gives medical clearance for admission to CIR today.  Pt. And wife very pleased.  RNCM [employer] and pt.s RN have been updated with plan. I have spoken with Melissa in HD and requested that pt. Be off PD by 8am each morning.   Please call if questions.  [place] Admissions Coordinator Cell [phone] Office [phone]

## 2016-11-21 ENCOUNTER — Inpatient Hospital Stay (HOSPITAL_COMMUNITY): Payer: 59 | Admitting: Physical Therapy

## 2016-11-21 ENCOUNTER — Inpatient Hospital Stay (HOSPITAL_COMMUNITY): Payer: 59 | Admitting: Occupational Therapy

## 2016-11-21 ENCOUNTER — Inpatient Hospital Stay (HOSPITAL_COMMUNITY): Payer: Medicare Other | Admitting: Speech Pathology

## 2016-11-21 DIAGNOSIS — G9340 Encephalopathy, unspecified: Secondary | ICD-10-CM

## 2016-11-21 LAB — GLUCOSE, CAPILLARY
GLUCOSE-CAPILLARY: 103 mg/dL — AB (ref 65–99)
GLUCOSE-CAPILLARY: 114 mg/dL — AB (ref 65–99)
GLUCOSE-CAPILLARY: 106 mg/dL — AB (ref 65–99)
Glucose-Capillary: 102 mg/dL — ABNORMAL HIGH (ref 65–99)

## 2016-11-21 MED ORDER — PROCHLORPERAZINE MALEATE 5 MG PO TABS
5.0000 mg | ORAL_TABLET | Freq: Four times a day (QID) | ORAL | Status: DC | PRN
  Administered 2016-11-21: 10 mg via ORAL
  Filled 2016-11-21: qty 2

## 2016-11-21 NOTE — Evaluation (Signed)
Physical Therapy Assessment and Plan  Patient Details  Name: Hayden [name] MRN: [id number] Date of Birth: 30-Mar-1971  PT Diagnosis: Difficulty walking and Muscle weakness Rehab Potential: Good ELOS: 5-7 days   Today's Date: 11/21/2016 PT Individual Time: 9166-0600 PT Individual Time Calculation (min): 55 min    Problem List:  Patient Active Problem List   Diagnosis Date Noted  . Anemia of chronic disease   . Diabetes mellitus type 2 in nonobese (HCC)   . Confusion   . S/P femoral-popliteal bypass surgery   . Tobacco abuse   . Benign essential HTN   . History of CVA (cerebrovascular accident)   . Post-operative pain   . Acute respiratory failure with hypoxia ([place])   . Agitation 11/04/2016  . Acute encephalopathy 11/04/2016  . Cellulitis of right lower extremity   . Hypokalemia 11/02/2016  . QT prolongation   . Gangrene of right foot ([place])   . Pressure injury of skin 10/24/2016  . Cellulitis of great toe of right foot 10/23/2016  . Leukocytosis 10/23/2016  . Hyperkalemia 10/23/2016  . Abdominal hematoma 11/15/2015  . HCAP (healthcare-associated pneumonia) 11/15/2015  . Acute blood loss anemia 11/15/2015  . Absolute anemia   . Right lower quadrant pain   . Pneumonia involving right lung 11/04/2015  . HTN (hypertension) 11/04/2015  . Noninfectious gastroenteritis and colitis 11/04/2015  . Gastroenteritis 11/04/2015  . CVA (cerebral infarction) 06/19/2015  . End stage renal disease on dialysis ([place]) 06/19/2015  . Lacunar infarct, acute ([place]) 06/19/2015  . GERD (gastroesophageal reflux disease) 06/19/2015  . Anxiety and depression 06/19/2015  . Hypertension, well controlled 06/19/2015  . End stage renal disease ([place]) 01/21/2013  . Acute coronary syndrome ([place]) 09/22/2012  . History of partial Right nephrectomy for renal mass (2008) 09/22/2012  . Left ventricular hypertrophy (moderate) per ECHO 2010 09/22/2012  . Chronic systolic congestive heart failure/EF 40% PER  echo 2010 09/22/2012  . 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 Acute kidney injury ([place]) 09/21/2012  . Chest pain 09/21/2012    Past Medical History:  Past Medical History:  Diagnosis Date  . Anemia March 2014  . Cancer ([place])    Kidney  . CHF (congestive heart failure) ([place]) 4599   Acute systolic and diastolic CHF  . Depression    & rage --  was in counseling....great now  . Diabetes mellitus    NO DM SINCE LOST 130LBS  . ESRD on peritoneal dialysis ([place]) 2018   Started in-center HD approx 2015 for 2 years, then did about 1 year of home HD and then started peritoneal dialysis in early 2018.    Marland Kitchen History of kidney cancer   . Hyperlipidemia   . Hypertension   . Obesity   . PONV (postoperative nausea and vomiting)   . Stroke [facility])    Past Surgical History:  Past Surgical History:  Procedure Laterality Date  . ABDOMINAL AORTOGRAM W/LOWER EXTREMITY N/A 10/28/2016   Procedure: Abdominal Aortogram w/Lower Extremity;  Surgeon: Angelia Mould, MD;  Location: [place] [facility];  Service: Cardiovascular;  Laterality: N/A;  . AMPUTATION Right 11/05/2016   Procedure: AMPUTATION RIGHT FIRST RAY;  Surgeon: Conrad Keota, MD;  Location: [facility] OR;  Service: Vascular;  Laterality: Right;  . AV FISTULA PLACEMENT Left 01/26/2013   Procedure: ARTERIOVENOUS (AV) FISTULA CREATION - LEFT RADIAL CEPHALIC AVF;  Surgeon: Angelia Mould, MD;  Location: [place];  Service: Vascular;  Laterality: Left;  . CHOLECYSTECTOMY  11/06/2015   Procedure: LAPAROSCOPIC  CHOLECYSTECTOMY;  Surgeon: Ralene Ok, MD;  Location: [place];  Service: General;;  . FEMORAL-POPLITEAL BYPASS GRAFT Right 11/05/2016   Procedure: BYPASS GRAFT FEMORAL-POPLITEAL ARTERY USING NON-REVERSED RIGHT GREATER SAPPHENOUS VEIN;  Surgeon: Conrad Dunn Center, MD;  Location: [place];  Service: Vascular;  Laterality: Right;  . HERNIA REPAIR    . LOWER EXTREMITY ANGIOGRAM Right 10/30/2016   Procedure: Right  LOWER EXTREMITY ANGIOGRAM WITH RIGHT  SUPERFICIAL FEMORAL ARTERY balloon angioplasty;  Surgeon: Conrad Moreland, MD;  Location: [place];  Service: Vascular;  Laterality: Right;  . NEPHRECTOMY Right 2008   partial  . TESTICLE TORSION REDUCTION    . TONSILLECTOMY AND ADENOIDECTOMY    . VEIN HARVEST Right 11/05/2016   Procedure: RIGHT GREATER SAPPHENOUS VEIN HARVEST;  Surgeon: Conrad Newcastle, MD;  Location: [facility] OR;  Service: Vascular;  Laterality: Right;    Assessment & Plan Clinical Impression: Hayden [name] is a 46 y.o. 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 Per chart review and wife, patient lives with his spouse was independent with a cane prior to admission. Mobile home with 4 steps to entry. Wife works a swing shift. Presented 10/23/2016 with right foot redness and pain. Initial injury happened  when he caught his foot and the track of the backseat of his sisters minivan. He initially tried to keep the area clean and dry. He was given a dose of vancomycin in the emergency room but left AMA. Findings a WBC elevated 14,600, lactic acid 0.73. X-rays of the right foot show no acute abnormalities. He was placed on broad-spectrum antibiotics. 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 The anterior tibial artery appears occluded. Underwent angioplasty right superficial femoral artery per vascular surgery 11/05/2016 per Dr. Bridgett Larsson after receiving cardiac clearance as well as right first digit amputation and dorsal ulcer debridement. Hospital course neurology consulted for altered mental status and questionable seizure EEG showed nonspecific cerebral dysfunction and encephalopathy no seizure activity noted and maintained on Depakote. Patient did require short-term intubation extubated 11/06/2016.  Dialysis ongoing as per renal services. Patient with intermittent bouts of agitation and restlessness. Subcutaneous heparin for DVT prophylaxis. Physical therapy evaluation completed with recommendations of physical medicine rehabilitation consult.Patient was admitted for a comprehensive rehabilitation program. Patient transferred to CIR on 11/20/2016 .   Patient currently requires min with mobility secondary to muscle weakness and decreased standing balance, decreased postural control and decreased balance strategies.  Prior to hospitalization, patient was independent  with mobility and lived with Spouse, Son, Daughter in a Mobile home home.  Home access is 4Stairs to enter.  Patient will benefit from skilled PT intervention to maximize safe functional mobility and minimize fall risk for planned discharge home with intermittent assist.  Anticipate patient will benefit from follow up [facility] at discharge.  PT - End of Session Activity Tolerance: Decreased this session Endurance Deficit: Yes Endurance Deficit Description: Required rest breaks within session  PT Assessment Rehab Potential (ACUTE/IP ONLY): Good Barriers to Discharge: Inaccessible home environment PT Patient demonstrates impairments in the following area(s): Balance;Endurance;Pain PT Transfers Functional Problem(s): Bed Mobility;Bed to Chair;Car;Furniture PT Locomotion Functional Problem(s): Stairs;Ambulation PT Plan PT Intensity: Minimum of 1-2 x/day ,45 to 90 minutes PT Frequency: 5 out of 7 days PT Duration Estimated Length of Stay: 5-7 days PT Treatment/Interventions: Ambulation/gait  training;Community reintegration;DME/adaptive equipment instruction;Neuromuscular re-education;Psychosocial support;Stair training;UE/LE Strength taining/ROM;UE/LE Coordination activities;Therapeutic Activities;Discharge planning;Balance/vestibular training;Functional mobility training;Patient/family education;Therapeutic Exercise PT Transfers Anticipated  Outcome(s): mod I PT Locomotion Anticipated Outcome(s): supervision with RW for household level ambulation PT Recommendation Recommendations for Other Services: Neuropsych consult Follow Up Recommendations: Home health PT;24 hour supervision/assistance Patient destination: Home Equipment Recommended: To be determined  Skilled Therapeutic Intervention C/o pain 7/10 but agreeable to therapy session.  Session focus on initial PT assessment and pt education regarding therapy goals, ELOS, and plan of care.  Provided emotional support to pt throughout session for coping with decreased independence and feelings of anxiety.  Pt returned to room at end of session and positioned back to bed with call bell in reach and needs met.   PT Evaluation Precautions/Restrictions Precautions Precautions: Fall Restrictions Weight Bearing Restrictions: No Pain Pain Assessment Pain Assessment: 0-10 Pain Score: 7  Pain Type: Surgical pain Pain Location: Foot Pain Orientation: Right Pain Descriptors / Indicators: Aching Pain Onset: On-going Pain Intervention(s): Repositioned Home Living/Prior Functioning Home Living Available Help at Discharge: Family;Available PRN/intermittently (available all but 1 hr/day) Type of Home: Mobile home Home Access: Stairs to enter Entrance Stairs-Number of Steps: 4 Entrance Stairs-Rails: Right;Left;Can reach both Home Layout: One level Bathroom Shower/Tub: Walk-in shower;Door [employer] Toilet: Standard Bathroom Accessibility: Yes  Lives With: Spouse;Son;Daughter Prior Function Level of Independence: Independent with transfers;Independent with gait  Able to Take Stairs?: Yes Driving: No Vocation: On disability Comments: Driving not currently working. Wife assists patient with home HD. Vision/Perception  Perception Perception: Within Functional Limits Praxis Praxis: Intact  Cognition Overall Cognitive Status: History of cognitive impairments - at baseline  (baseline STM deficits) Arousal/Alertness: Awake/alert Orientation Level: Oriented X4 Attention: Selective Selective Attention: Appears intact Memory: Impaired Memory Impairment: Decreased short term memory Decreased Short Term Memory: Functional basic;Verbal basic Awareness: Appears intact Problem Solving: Appears intact Behaviors: Poor frustration tolerance Safety/Judgment: Appears intact Sensation Sensation Light Touch: Appears Intact (per pt report) Proprioception: Appears Intact Coordination Gross Motor Movements are Fluid and Coordinated: No Fine Motor Movements are Fluid and Coordinated: Yes Motor  Motor Motor: Within Functional Limits  Mobility Bed Mobility Bed Mobility: Supine to Sit;Sit to Supine Supine to Sit: 6: Modified independent (Device/Increase time) Sit to Supine: 6: Modified independent (Device/Increase time) Transfers Transfers: Yes Stand Pivot Transfers: 5: Supervision Stand Pivot Transfer Details: Verbal cues for technique;Visual cues/gestures for precautions/safety;Verbal cues for safe use of DME/AE;Verbal cues for precautions/safety Squat Pivot Transfers: 5: Supervision Squat Pivot Transfer Details: Verbal cues for technique;Verbal cues for precautions/safety;Verbal cues for safe use of DME/AE Locomotion  Ambulation Ambulation: Yes Ambulation/Gait Assistance: 4: Min assist Ambulation Distance (Feet): 30 Feet Assistive device: Rolling walker Stairs / Additional Locomotion Stairs: No Wheelchair Mobility Wheelchair Mobility: Yes Wheelchair Assistance: 5: Careers information officer: Both upper extremities Distance: 150  Trunk/Postural Assessment  Cervical Assessment Cervical Assessment: Within Functional Limits Thoracic Assessment Thoracic Assessment: Within Functional Limits Lumbar Assessment Lumbar Assessment: Within Functional Limits Postural Control Postural Control: Within Functional Limits  Balance Balance Balance Assessed:  Yes Dynamic Sitting Balance Dynamic Sitting - Balance Support: Feet supported;No upper extremity supported Dynamic Sitting - Level of Assistance: 5: Stand by assistance Sitting balance - Comments: requires UE support  Static Standing Balance Static Standing - Balance Support: During functional activity Static Standing - Level of Assistance: 5: Stand by assistance Dynamic Standing Balance Dynamic Standing - Balance Support: During functional activity Dynamic Standing - Level of Assistance: 4: Min assist Extremity Assessment  RUE Assessment RUE Assessment: Within Functional  Limits LUE Assessment LUE Assessment: Within Functional Limits       See Function Navigator for Current Functional Status.   Refer to Care Plan for Long Term Goals  Recommendations for other services: Neuropsych  Discharge Criteria: Patient will be discharged from PT if patient refuses treatment 3 consecutive times without medical reason, if treatment goals not met, if there is a change in medical status, if patient makes no progress towards goals or if patient is discharged from hospital.  The above assessment, treatment plan, treatment alternatives and goals were discussed and mutually agreed upon: by patient  Earnest Conroy Penven-Crew 11/21/2016, 4:47 PM

## 2016-11-21 NOTE — Evaluation (Addendum)
Occupational Therapy Assessment and Plan  Patient Details  Name: Hayden [name] MRN: [id number] Date of Birth: 1971-06-15  OT Diagnosis: muscle weakness (generalized) Rehab Potential: Rehab Potential (ACUTE ONLY): Good ELOS: 5-7 days   Today's Date: 11/21/2016 OT Individual Time: 0175-1025 OT Individual Time Calculation (min): 57 min    Problem List:  Patient Active Problem List   Diagnosis Date Noted  . Anemia of chronic disease   . Diabetes mellitus type 2 in nonobese (HCC)   . Confusion   . S/P femoral-popliteal bypass surgery   . Tobacco abuse   . Benign essential HTN   . History of CVA (cerebrovascular accident)   . Post-operative pain   . Acute respiratory failure with hypoxia ([place])   . Agitation 11/04/2016  . Acute encephalopathy 11/04/2016  . Cellulitis of right lower extremity   . Hypokalemia 11/02/2016  . QT prolongation   . Gangrene of right foot ([place])   . Pressure injury of skin 10/24/2016  . Cellulitis of great toe of right foot 10/23/2016  . Leukocytosis 10/23/2016  . Hyperkalemia 10/23/2016  . Abdominal hematoma 11/15/2015  . HCAP (healthcare-associated pneumonia) 11/15/2015  . Acute blood loss anemia 11/15/2015  . Absolute anemia   . Right lower quadrant pain   . Pneumonia involving right lung 11/04/2015  . HTN (hypertension) 11/04/2015  . Noninfectious gastroenteritis and colitis 11/04/2015  . Gastroenteritis 11/04/2015  . CVA (cerebral infarction) 06/19/2015  . End stage renal disease on dialysis ([place]) 06/19/2015  . Lacunar infarct, acute ([place]) 06/19/2015  . GERD (gastroesophageal reflux disease) 06/19/2015  . Anxiety and depression 06/19/2015  . Hypertension, well controlled 06/19/2015  . End stage renal disease ([place]) 01/21/2013  . Acute coronary syndrome ([place]) 09/22/2012  . History of partial Right nephrectomy for renal mass (2008) 09/22/2012  . Left ventricular hypertrophy (moderate) per ECHO 2010 09/22/2012  . Chronic systolic congestive  heart failure/EF 40% PER echo 2010 09/22/2012  . 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 Acute kidney injury ([place]) 09/21/2012  . Chest pain 09/21/2012    Past Medical History:  Past Medical History:  Diagnosis Date  . Anemia March 2014  . Cancer ([place])    Kidney  . CHF (congestive heart failure) ([place]) 8527   Acute systolic and diastolic CHF  . Depression    & rage --  was in counseling....great now  . Diabetes mellitus    NO DM SINCE LOST 130LBS  . ESRD on peritoneal dialysis ([place]) 2018   Started in-center HD approx 2015 for 2 years, then did about 1 year of home HD and then started peritoneal dialysis in early 2018.    Marland Kitchen History of kidney cancer   . Hyperlipidemia   . Hypertension   . Obesity   . PONV (postoperative nausea and vomiting)   . Stroke [facility])    Past Surgical History:  Past Surgical History:  Procedure Laterality Date  . ABDOMINAL AORTOGRAM W/LOWER EXTREMITY N/A 10/28/2016   Procedure: Abdominal Aortogram w/Lower Extremity;  Surgeon: Angelia Mould, MD;  Location: [facility];  Service: Cardiovascular;  Laterality: N/A;  . AMPUTATION Right 11/05/2016   Procedure: AMPUTATION RIGHT FIRST RAY;  Surgeon: Conrad Hansen, MD;  Location: [facility] OR;  Service: Vascular;  Laterality: Right;  . AV FISTULA PLACEMENT Left 01/26/2013   Procedure: ARTERIOVENOUS (AV) FISTULA CREATION - LEFT RADIAL CEPHALIC AVF;  Surgeon: Angelia Mould, MD;  Location: [facility];  Service: Vascular;  Laterality: Left;  . CHOLECYSTECTOMY  11/06/2015  Procedure: LAPAROSCOPIC CHOLECYSTECTOMY;  Surgeon: Ralene Ok, MD;  Location: [facility];  Service: General;;  . FEMORAL-POPLITEAL BYPASS GRAFT Right 11/05/2016   Procedure: BYPASS GRAFT FEMORAL-POPLITEAL ARTERY USING NON-REVERSED RIGHT GREATER SAPPHENOUS VEIN;  Surgeon: Conrad Kreamer, MD;  Location: [facility];  Service: Vascular;  Laterality: Right;  . HERNIA REPAIR    . LOWER EXTREMITY ANGIOGRAM Right 10/30/2016   Procedure: Right  LOWER  EXTREMITY ANGIOGRAM WITH RIGHT SUPERFICIAL FEMORAL ARTERY balloon angioplasty;  Surgeon: Conrad Hesston, MD;  Location: [facility];  Service: Vascular;  Laterality: Right;  . NEPHRECTOMY Right 2008   partial  . TESTICLE TORSION REDUCTION    . TONSILLECTOMY AND ADENOIDECTOMY    . VEIN HARVEST Right 11/05/2016   Procedure: RIGHT GREATER SAPPHENOUS VEIN HARVEST;  Surgeon: Conrad Caledonia, MD;  Location: [facility] OR;  Service: Vascular;  Laterality: Right;    Assessment & Plan Clinical Impression: Patient is a 46 y.o. year old male with recent admission to the hospital on 4/25 for dry gangrene and cellulitis on the right foot./Right great toe,  and is now s/p  right CFA to below knee popliteal artery bypass, right first toe amputation, right dorsal ulcer debridement performed on 5/8.  He was extubated morning of 5/9. Pt with post-op encephalopathy and agitation. PMH - ESRD on peritoneal dialysis, HTN, CVA, kidney CA. Patient transferred to CIR on 11/20/2016 .    Patient currently requires mod with basic self-care skills secondary to muscle weakness and decreased standing balance, decreased activity tolerance, and decreased balance strategies.  Prior to hospitalization, patient could complete BADL with modified independent .  Patient will benefit from skilled intervention to increase independence with basic self-care skills prior to discharge home with care partner.  Anticipate patient will require intermittent supervision and follow up home health.  OT - End of Session Endurance Deficit: Yes Endurance Deficit Description: Required rest breaks within BADL session  OT Assessment Rehab Potential (ACUTE ONLY): Good OT Patient demonstrates impairments in the following area(s): Balance;Endurance;Motor;Pain;Safety OT Basic ADL's Functional Problem(s): Grooming;Bathing;Dressing;Toileting OT Transfers Functional Problem(s): Toilet;Tub/Shower OT Additional Impairment(s): None OT Plan OT Intensity: Minimum of 1-2 x/day, 45  to 90 minutes OT Frequency: 5 out of 7 days OT Duration/Estimated Length of Stay: 6-9 days OT Treatment/Interventions: Balance/vestibular training;Community reintegration;Discharge planning;DME/adaptive equipment instruction;Functional mobility training;Pain management;Patient/family education;Psychosocial support;Self Care/advanced ADL retraining;Skin care/wound managment;Therapeutic Activities;Therapeutic Exercise;UE/LE Coordination activities;UE/LE Strength taining/ROM;Wheelchair propulsion/positioning OT Basic Self-Care Anticipated Outcome(s): Mod I OT Toileting Anticipated Outcome(s): Mod I OT Bathroom Transfers Anticipated Outcome(s): Mod I  OT Recommendation Recommendations for Other Services: Neuropsych consult Patient destination: Home Follow Up Recommendations: Home health OT Equipment Recommended: To be determined (likely RW and shower chair)   Skilled Therapeutic Intervention Initial eval completed with treatment provided to address functional transfers,improved sit<>stand, standing tolerance, and BADL skills. Pt recently finished PD upon OT arrival and stated need to go to the bathroom. Pt impulsive and anxious, requiring VC to wait for OT assistance prior to standing. Mod A sit<>stand with max cues for safe use or RW. Pt hopped to bathroom with mod A, and kept R LE off the ground 2/2 pain. Mod A to maintain balance when turning to sit onto toilet. Pt required assistance for clothing management and toileting from spouse as he would not allow OT to assist. Min A to stand from toilet with Min A for balance while spouse assisted with pulling up pants. Stand-pivot to w/c with Min  A and RW. Pt then brought to the sink for standing  grooming tasks requiring min A + hips supported on sink, and unilateral UE support. Discussed OT goals and plan of care with pt and spouse, elevated R LE in wc and pt left with RN.   OT Evaluation Precautions/Restrictions  Precautions Precautions:  Fall Restrictions Weight Bearing Restrictions: No Pain Pain Assessment Pain Assessment: 0-10 Pain Score: 7  Pain Type: Surgical pain Pain Location: Foot Pain Orientation: Right Pain Descriptors / Indicators: Aching Pain Onset: On-going Pain Intervention(s): Repositioned Home Living/Prior Functioning Home Living Living Arrangements: Spouse/significant other, Children Available Help at Discharge: Family, Available PRN/intermittently (available all but 1 hr/day) Type of Home: Mobile home Home Access: Stairs to enter [employer] of Steps: 4 Entrance Stairs-Rails: Right, Left, Can reach both Home Layout: One level Bathroom Shower/Tub: Gaffer, Charity fundraiser: Associate Professor Accessibility: Yes  Lives With: Spouse, Son, Daughter IADL History Leisure and Hobbies: Company secretary and riding motorcycle" Prior Function Level of Independence: Independent with transfers, Independent with gait  Able to Take Stairs?: Yes Driving: No Vocation: On disability Comments: Driving not currently working. Wife assists patient with home HD. ADL ADL ADL Comments: Please see functional navigator Vision Baseline Vision/History: Wears glasses Wears Glasses: At all times Patient Visual Report: No change from baseline Perception  Perception: Within Functional Limits Cognition Overall Cognitive Status: History of cognitive impairments - at baseline (baseline short-term memory deficits ) Arousal/Alertness: Awake/alert Orientation Level: Place;Person;Situation Person: Oriented Place: Oriented Situation: Oriented Year: 2018 Month: April Day of Week: Incorrect Memory: Impaired Memory Impairment: Decreased short term memory Decreased Short Term Memory: Functional basic;Verbal basic Immediate Memory Recall: Sock;Blue;Bed Memory Recall: Sock;Blue;Bed Memory Recall Sock: Without Cue Memory Recall Blue: Without Cue Memory Recall Bed: With Cue Attention: Selective Selective  Attention: Appears intact Awareness: Appears intact Problem Solving: Appears intact Behaviors: Impulsive;Poor frustration tolerance Safety/Judgment: Appears intact Sensation Sensation Proprioception: Appears Intact Coordination Gross Motor Movements are Fluid and Coordinated: No (2/2 NWB R LE 2/2 pain- although no WB restrictions) Fine Motor Movements are Fluid and Coordinated: Yes Motor  Motor Motor: Within Functional Limits Mobility  Bed Mobility Bed Mobility: Supine to Sit Supine to Sit: 5: Supervision  Balance Balance Balance Assessed: Yes Dynamic Sitting Balance Sitting balance - Comments: requires UE support  Static Standing Balance Static Standing - Balance Support: During functional activity (unilateral UE support) Static Standing - Level of Assistance: 4: Min assist Dynamic Standing Balance Dynamic Standing - Balance Support: During functional activity Dynamic Standing - Level of Assistance: 3: Mod assist Extremity/Trunk Assessment RUE Assessment RUE Assessment: Within Functional Limits LUE Assessment LUE Assessment: Within Functional Limits   See Function Navigator for Current Functional Status.   Refer to Care Plan for Long Term Goals  Recommendations for other services: Neuropsych   Discharge Criteria: Patient will be discharged from OT if patient refuses treatment 3 consecutive times without medical reason, if treatment goals not met, if there is a change in medical status, if patient makes no progress towards goals or if patient is discharged from hospital.  The above assessment, treatment plan, treatment alternatives and goals were discussed and mutually agreed upon: by patient and by family  Valma Cava 11/21/2016, 4:39 PM

## 2016-11-21 NOTE — Consult Note (Addendum)
WOC follow-up: Ortho service has assessed right foot and leg wounds this afternoon and provided topical treatment orders.  Please refer to their team for further questions regarding plan of care. Please re-consult if further assistance is needed.  Thank-you,  Julien Girt MSN, [place], [place], [place], [place]

## 2016-11-21 NOTE — Progress Notes (Addendum)
[facility]ICINE [facility] REH[facility]ILITATION     PROGRESS NOTE    Subjective/Complaints: Had a reasonable night. Able to sleep. No problems with PD. Had questions about right leg wound   ROS: pt denies nausea, vomiting, diarrhea, cough, shortness of breath or chest pain   Objective: Vital Signs: Blood pressure (!) 151/79, pulse 74, temperature 98.2 F (36.8 C), temperature source Oral, resp. rate 18, weight 84.9 kg (187 lb 3.9 oz), SpO2 98 %. No results found.  Recent Labs  11/19/16 0749 11/20/16 0550  WBC 13.6* 17.0*  HGB 9.3* 9.4*  HCT 30.6* 30.6*  PLT 599* 666*    Recent Labs  11/19/16 0749 11/20/16 0550  NA 137 138  K 3.0* 3.3*  CL 98* 99*  GLUCOSE 115* 109*  BUN 42* 47*  CREATININE 7.39* 7.65*  CALCIUM 8.7* 9.0   CBG (last 3)   Recent Labs  11/20/16 1658 11/20/16 2111 11/21/16 0625  GLUCAP 104* 120* 103*    Wt Readings from Last 3 Encounters:  11/21/16 84.9 kg (187 lb 3.9 oz)  11/19/16 84.8 kg (187 lb)  07/12/16 99.8 kg (220 lb)    Physical Exam:  Constitutional: He appears well-developed and well-nourished.  HENT:  Head: Normocephalic and atraumatic.  Eyes: Conjunctivae and EOM are normal.  Neck: Normal range of motion. Neck supple. No thyromegaly present.  Cardiovascular: Irregular with systolic murmur   Respiratory: Normal effort, CTA bilaterally.  GI: Soft. Bowel sounds are normal. He exhibits no distension.  Musculoskeletal: He exhibits edema and tenderness.  Neurological: He is alert.  More alert and aware A&Ox2, confused Motor: B/l UE, LLE: 5/5 proximal to distal RLE: HF 4/5, KE 4/5.  Wiggles toes Sensation intact to light touch  Skin:  Dressing to RLE. Exposed wound near knee with mild serosanginous drainage Psychiatric: pleasant, a little impulsive.   Assessment/Plan: 1. Functional and cognitive deficits secondary to debility and encephalopathy which require 3+ hours per day of interdisciplinary therapy in a comprehensive  inpatient rehab setting. Physiatrist is providing close team supervision and 24 hour management of active medical problems listed below. Physiatrist and rehab team continue to assess barriers to discharge/monitor patient progress toward functional and medical goals.  Function:  Bathing Bathing position      Bathing parts      Bathing assist        Upper Body Dressing/Undressing Upper body dressing                    Upper body assist        Lower Body Dressing/Undressing Lower body dressing                                  Lower body assist        Toileting Toileting Toileting activity did not occur: Safety/medical concerns Toileting steps completed by patient: Adjust clothing prior to toileting, Adjust clothing after toileting Toileting steps completed by helper: Adjust clothing prior to toileting, Performs perineal hygiene, Adjust clothing after toileting    Toileting assist Assist level: Touching or steadying assistance (Pt.75%)   Transfers Chair/bed transfer             Locomotion Ambulation           Wheelchair          Cognition Comprehension Comprehension assist level: Understands basic 90% of the time/cues < 10% of the time  Expression Expression assist level:  Expresses complex 90% of the time/cues < 10% of the time  Social Interaction Social Interaction assist level: Interacts appropriately with others with medication or extra time (anti-anxiety, antidepressant).  Problem Solving Problem solving assist level: Solves complex 90% of the time/cues < 10% of the time  Memory Memory assist level: More than reasonable amount of time   Medical Problem List and Plan: 1.  Debilitation secondary to right first digit amputation/acute encephalopathy/multi-medical  -begin therapies today 2.  DVT Prophylaxis/Anticoagulation: Subcutaneous heparin. Monitor for any bleeding episodes 3. Pain Management: Hydrocodone as needed 4. Mood: Lexapro 10  mg daily, Depakote 500 mg every 12 hours, Ativan as needed 5. Neuropsych: This patient is not capable of making decisions on his own behalf. 6. Skin/Wound Care: compressive dressing RLE  -follow up for dressing change by [facility] vs surgery 7. Fluids/Electrolytes/Nutrition: Routine I&O with follow-up chemistries  -mild hypokalemia 8. ESRD. Continue dialysis as per renal services 9. Hypertension. Clonidine patch 0.2 mg weekly, Isordil 5 mg twice a day, Norvasc 10 mg daily, labetalol 300 mg twice a day, hydralazine 75 mg every 8 hours.  -fair control at present 10. Diabetes mellitus. Latest hemoglobin A1c 5.4. Check blood sugars before meals and at bedtime.SSI 11. Chronic congestive heart failure. Monitor for any signs of fluid overload 12. Acute on chronic anemia. Follow-up CBC 13. Tobacco abuse. Counseling 14. Leukocytosis: up to 17k today  -wound follow up as above  -afebrile  -follow labs/clinically   LOS (Days) 1 A FACE TO FACE EVALUATION WAS PERFORMED  Meredith Staggers, MD 11/21/2016 8:48 AM

## 2016-11-21 NOTE — Progress Notes (Signed)
Social Work Assessment and Plan Social Work Assessment and Plan  Patient Details  Name: Hayden [name] MRN: [id number] Date of Birth: [birth year]  Today's Date: 11/21/2016  Problem List:  Patient Active Problem List   Diagnosis Date Noted  . Anemia of chronic disease   . Diabetes mellitus type 2 in nonobese (HCC)   . Confusion   . S/P femoral-popliteal bypass surgery   . Tobacco abuse   . Benign essential HTN   . History of CVA (cerebrovascular accident)   . Post-operative pain   . Acute respiratory failure with hypoxia ([place])   . Agitation 11/04/2016  . Acute encephalopathy 11/04/2016  . Cellulitis of right lower extremity   . Hypokalemia 11/02/2016  . QT prolongation   . Gangrene of right foot ([place])   . Pressure injury of skin 10/24/2016  . Cellulitis of great toe of right foot 10/23/2016  . Leukocytosis 10/23/2016  . Hyperkalemia 10/23/2016  . Abdominal hematoma 11/15/2015  . HCAP (healthcare-associated pneumonia) 11/15/2015  . Acute blood loss anemia 11/15/2015  . Absolute anemia   . Right lower quadrant pain   . Pneumonia involving right lung 11/04/2015  . HTN (hypertension) 11/04/2015  . Noninfectious gastroenteritis and colitis 11/04/2015  . Gastroenteritis 11/04/2015  . CVA (cerebral infarction) 06/19/2015  . End stage renal disease on dialysis ([place]) 06/19/2015  . Lacunar infarct, acute ([place]) 06/19/2015  . GERD (gastroesophageal reflux disease) 06/19/2015  . Anxiety and depression 06/19/2015  . Hypertension, well controlled 06/19/2015  . End stage renal disease ([place]) 01/21/2013  . Acute coronary syndrome ([place]) 09/22/2012  . History of partial Right nephrectomy for renal mass (2008) 09/22/2012  . Left ventricular hypertrophy (moderate) per ECHO 2010 09/22/2012  . Chronic systolic congestive heart failure/EF 40% PER echo 2010 09/22/2012  . 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 Acute kidney injury ([place]) 09/21/2012  . Chest pain 09/21/2012   Past  Medical History:  Past Medical History:  Diagnosis Date  . Anemia March 2014  . Cancer ([place])    Kidney  . CHF (congestive heart failure) ([place]) 5176   Acute systolic and diastolic CHF  . Depression    & rage --  was in counseling....great now  . Diabetes mellitus    NO DM SINCE LOST 130LBS  . ESRD on peritoneal dialysis ([place]) 2018   Started in-center HD approx 2015 for 2 years, then did about 1 year of home HD and then started peritoneal dialysis in early 2018.    Marland Kitchen History of kidney cancer   . Hyperlipidemia   . Hypertension   . Obesity   . PONV (postoperative nausea and vomiting)   . Stroke [facility])    Past Surgical History:  Past Surgical History:  Procedure Laterality Date  . ABDOMINAL AORTOGRAM W/LOWER EXTREMITY N/A 10/28/2016   Procedure: Abdominal Aortogram w/Lower Extremity;  Surgeon: Angelia Mould, MD;  Location: [place] CV LAB;  Service: Cardiovascular;  Laterality: N/A;  . AMPUTATION Right 11/05/2016   Procedure: AMPUTATION RIGHT FIRST RAY;  Surgeon: Conrad [place], MD;  [facility];  Service: Vascular;  Laterality: Right;  . AV FISTULA PLACEMENT Left 01/26/2013   Procedure: ARTERIOVENOUS (AV) FISTULA CREATION - LEFT RADIAL CEPHALIC AVF;  Surgeon: Angelia Mould, MD;  Location: [place];  Service: Vascular;  Laterality: Left;  . CHOLECYSTECTOMY  11/06/2015   Procedure: LAPAROSCOPIC CHOLECYSTECTOMY;  Surgeon: Ralene Ok, MD;  Location: [place];  Service: General;;  . FEMORAL-POPLITEAL BYPASS GRAFT Right 11/05/2016   Procedure: BYPASS  GRAFT FEMORAL-POPLITEAL ARTERY USING NON-REVERSED RIGHT GREATER SAPPHENOUS VEIN;  Surgeon: Conrad [place], MD;  Location: [place];  Service: Vascular;  Laterality: Right;  . HERNIA REPAIR    . LOWER EXTREMITY ANGIOGRAM Right 10/30/2016   Procedure: Right  LOWER EXTREMITY ANGIOGRAM WITH RIGHT SUPERFICIAL FEMORAL ARTERY balloon angioplasty;  Surgeon: Conrad [place], MD;  Location: [place];  Service: Vascular;  Laterality: Right;  .  NEPHRECTOMY Right 2008   partial  . TESTICLE TORSION REDUCTION    . TONSILLECTOMY AND ADENOIDECTOMY    . VEIN HARVEST Right 11/05/2016   Procedure: RIGHT GREATER SAPPHENOUS VEIN HARVEST;  Surgeon: Conrad [place], MD;  Location: [place];  Service: Vascular;  Laterality: Right;   Social History:  reports that he has quit smoking. His smoking use included Cigarettes. He has a 2.50 pack-year smoking history. He has never used smokeless tobacco. He reports that he uses drugs, including Marijuana. He reports that he does not drink alcohol.  Family / Support Systems Marital Status: Married Patient Roles: Spouse, Parent Spouse/Significant Other: Joelene Millin [phone] Children: 52 and 29 yo at home Other Supports: Friends and extended family Anticipated Caregiver: [name] and children Ability/Limitations of Caregiver: Maudie Mercury works full time-swing shift children work also, may have one hour of non-coverage at discharge Caregiver Availability: Other (Comment) (23 hrs of coverage) Family Dynamics: Close knit family all will help one another. The kids are currently taking care of the animals and house. They have friends who are supportive and will assist and visit.  Social History Preferred language: English Religion: Unknown Cultural Background: No issues Education: High School Read: Yes Write: Yes Employment Status: Disabled Guardian/Conservator: None-according to MD pt is not fully capable of making his decisions at this time. Will look toward his wife to make any decisions for him while here   Abuse/Neglect Physical Abuse: Denies Verbal Abuse: Denies Sexual Abuse: Denies Exploitation of patient/patient's resources: Denies Self-Neglect: Denies  Emotional Status Pt's affect, behavior adn adjustment status: Pt is motivated and wanting to get home ASAP, since he has been here for 4 weeks. His wife has been staying with him and working in between. Pt wants to get back to being independent  and able to take care of himself.  Recent Psychosocial Issues: other health issues was managing until this Pyschiatric History: No history seems to be coping appropriately and talking about his concerns. Will see what team feels and make referral to neuro-psych if feel would be benefical. Substance Abuse History: Marijuana and tobacco aware of MD recommendation and will think about. Aware of the community resources available to him if he chooses to pursue this  Patient / Family Perceptions, Expectations & Goals Pt/Family understanding of illness & functional limitations: Pt and wife can explain his surgery and issues. Wife is here and talks with the MD about pt's healing and treatment plan. Both feel their questions and concerns have been addressed while here. Premorbid pt/family roles/activities: Husband, Father, Son, retiree, Dialysis pt, freind, etc Anticipated changes in roles/activities/participation: resume Pt/family expectations/goals: Pt states: " I want to be able to take care of myself before I leave here."  Wife states: " We will assist but know he wants to do it on his own."  [employer]: Other (Comment) (PD patient) Premorbid Home Care/DME Agencies: Other (Comment) (had in past) Transportation available at discharge: Wife and children Resource referrals recommended: Support group (specify)  Discharge Planning Living Arrangements: Spouse/significant other, Children Support Systems: Spouse/significant other, Children, Other relatives, Friends/neighbors  Type of Residence: Private residence Insurance Resources: [employer], Multimedia programmer (specify) Sports administrator) Financial Resources: SSD, Family Support Financial Screen Referred: No Living Expenses: Own Money Management: Spouse, Patient Does the patient have any problems obtaining your medications?: No Home Management: Wife does pt assists with some when he feels up to it Patient/Family Preliminary Plans: Return  home with wife and children who can assist up to 23 hr per day. All work but between their schedules they can almost be there 24 hr. Pt wants to be mod/i before going home. Will await team evaluations and work on discharge needs. Social Work Anticipated Follow Up Needs: HH/OP, Support Group  Clinical Impression Pleasant couple who are optimistic this is their last floor before going home. It has been a long hospitalization-one month. Both are ready for him to do well and get home when medically ready. Wife has been staying here to assist with PD and provide support to pt. She does work here in [place]. Will await therapy evaluations and work on a discharge plan.  Elease Hashimoto 11/21/2016, 11:26 AM
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
## 2016-11-21 NOTE — Consult Note (Addendum)
WOC follow-up: Hayden [name], WTA removed previous Profore compression wrap; surgical wounds  To right foot and leg have declined and have eschar in several patchy areas surrounding sutures.  Upper leg with mod amt bleeding and pressure held.  Called Dr Bridgett Larsson to discuss plan of care amd he was in the [place]; nurse relayed message of the need for further assessment by the ortho team.  He states he will assess the wounds after he is out of surgery.  WTA applied xeroform and kerlex and pressure dressing to bleeding site until further orders are available from the ortho service. Please re-consult if further assistance is needed.  Thank-you,  Julien Girt MSN, [place], [place], [place], [place]

## 2016-11-21 NOTE — Consult Note (Signed)
[place] Nurse wound follow up Wound type:surgical Measurement:in bend of right knee 6cm inc with 1cm x 0.5cm open area, bleeding with darkened skin area around the area Lateral right lower leg 9cm inc with 2cm x 2cm black area at proximal end Right foot where great toe amp, 7cm inc with black areas and discoloration Bottom of right foot 4cm x 2cm area with 2cm x 1cm black area in center Wound bed:see above Drainage (amount, consistency, odor) see above Periwound: see above Dressing procedure/placement/frequency: Wound looks like it is deteriorating from my last time seeing it.  Discussed with Julien Girt, [place] and she is notifying Dr. Bridgett Larsson.  Dressed with xeroform and dry gauze, wrapped with Kerlix.  Supplies left in room, asked bedside nurse to have doppler in the room in case MD wants it. Will watch for further orders.    Fara Olden, RN-C, WTA-C Wound Treatment Associate

## 2016-11-21 NOTE — Progress Notes (Signed)
Patient information reviewed and entered into eRehab system by Maisha Bogen, RN, CRRN, PPS Coordinator.  Information including medical coding and functional independence measure will be reviewed and updated through discharge.     Per nursing patient was given "Data Collection Information Summary for Patients in Inpatient Rehabilitation Facilities with attached "Privacy Act Statement-Health Care Records" upon admission.  

## 2016-11-21 NOTE — Progress Notes (Addendum)
   Daily Progress Note   Assessment/Planning:   POD #16 s/p R CFA to BK pop BPG w/ NR GSV, R 1st ray amp, R dorsal ulcer debridement   I suspect Xeroform with ProFore dressing lead to some additional maceration of calf incisions +/- superinfection  Would switch to BID dry dressing to calf wounds over the weekend to dry up the wound.    Similarly would dry up R foot with dry dressing daily for now.  Daily shower would be ok and would help hygiene of the wounds.  Dry dressings immediately afterwards.  Will re-evaluate wound on Tuesday.  Likely will start some enzymatic debridement of the calf with Santyl and wet-to-dry dressings.  If skin bridge between popliteal exposure and vein harvest dies, may need to get Plastic Surgery's involvement with debridement +/- Acell placement +/- skin grafting.  Too early to determine.  Will start removing sutures in R foot next week.  If wound fall apart, pt may need further debridement with Dr. Sharol Given.   Subjective   C/o pain in R leg   Objective   Vitals:   11/20/16 1534 11/20/16 1821 11/20/16 2339 11/21/16 0554  BP: (!) 146/84 (!) 138/58 (!) 167/101 (!) 151/79  Pulse: (!) 122 (!) 108 (!) 119 74  Resp: 20 18  18   Temp: 98.6 F (37 C) 98.2 F (36.8 C)  98.2 F (36.8 C)  TempSrc: Oral Oral  Oral  SpO2: 99%   98%  Weight:    187 lb 3.9 oz (84.9 kg)     Intake/Output Summary (Last 24 hours) at 11/21/16 1153 Last data filed at 11/21/16 0845  Gross per 24 hour  Intake            14980 ml  Output            17075 ml  Net            -2095 ml    VASC R groin: healing, R calf exposure: ischemic skin with some extension to R vein harvest inc, R calf vein harvest: ischemic skin, some venous ooze, R foot: no significant changes, faintly palpable AT  NEURO A&O today    Laboratory   CBC CBC Latest Ref Rng & Units 11/20/2016 11/19/2016 11/17/2016  WBC 4.0 - 10.5 K/uL 17.0(H) 13.6(H) 12.3(H)  Hemoglobin 13.0 - 17.0 g/dL 9.4(L) 9.3(L) 8.4(L)    Hematocrit 39.0 - 52.0 % 30.6(L) 30.6(L) 28.2(L)  Platelets 150 - 400 K/uL 666(H) 599(H) 507(H)    BMET    Component Value Date/Time   NA 138 11/20/2016 0550   K 3.3 (L) 11/20/2016 0550   CL 99 (L) 11/20/2016 0550   CO2 27 11/20/2016 0550   GLUCOSE 109 (H) 11/20/2016 0550   BUN 47 (H) 11/20/2016 0550   CREATININE 7.65 (H) 11/20/2016 0550   CALCIUM 9.0 11/20/2016 0550   GFRNONAA 8 (L) 11/20/2016 0550   GFRAA 9 (L) 11/20/2016 0550     Adele Barthel, MD, FACS [facility] Office: [phone] Pager: [phone]  11/21/2016, 11:53 AM

## 2016-11-21 NOTE — Progress Notes (Signed)
[facility] Progress Note   Subjective: .  Transferred to rehab yesterday No PD issues  Objective Vitals:   11/20/16 1534 11/20/16 1821 11/20/16 2339 11/21/16 0554  BP: (!) 146/84 (!) 138/58 (!) 167/101 (!) 151/79  Pulse: (!) 122 (!) 108 (!) 119 74  Resp: 20 18  18   Temp: 98.6 F (37 C) 98.2 F (36.8 C)  98.2 F (36.8 C)  TempSrc: Oral Oral  Oral  SpO2: 99%   98%  Weight:    84.9 kg (187 lb 3.9 oz)   Physical Exam WNWD NAD RRR 3/6 systolic murmur  CTAB Abdomen: soft NT/ND  PD cath exit site with dressing in place No LE edema  R leg in ACE wrap some dried blood from top of bandage wrap   Dialysis Access: PD cath in place/ LUE AVF +bruit    Recent Labs Lab 11/18/16 0301 11/19/16 0749 11/20/16 0550  NA 134* 137 138  K 3.3* 3.0* 3.3*  CL 97* 98* 99*  CO2 27 27 27   GLUCOSE 97 115* 109*  BUN 43* 42* 47*  CREATININE 6.65* 7.39* 7.65*  CALCIUM 8.3* 8.7* 9.0  PHOS  --  4.5 5.0*     Recent Labs Lab 11/19/16 0749 11/20/16 0550  ALBUMIN 1.7* 1.7*    Recent Labs Lab 11/15/16 0320 11/17/16 0325 11/19/16 0749 11/20/16 0550  WBC 12.0* 12.3* 13.6* 17.0*  HGB 8.1* 8.4* 9.3* 9.4*  HCT 25.7* 28.2* 30.6* 30.6*  MCV 83.4 82.7 84.1 85.5  PLT 494* 507* 599* 666*     Recent Labs Lab 11/20/16 0749 11/20/16 1224 11/20/16 1658 11/20/16 2111 11/21/16 0625  GLUCAP 140* 113* 104* 120* 103*   Medications: . dialysis solution 1.5% low-MG/low-CA    . dialysis solution 2.5% low-MG/low-CA     . amLODipine  10 mg Oral Daily  . aspirin  81 mg Oral Daily  . cinacalcet  60 mg Oral Q supper  . cloNIDine  0.2 mg Transdermal Q Thu  . [START ON 11/24/2016] darbepoetin (ARANESP) injection - NON-DIALYSIS  200 mcg Subcutaneous Q Sun-1800  . divalproex  500 mg Oral Q12H  . docusate sodium  100 mg Oral Daily  . escitalopram  10 mg Oral Daily  . feeding supplement (NEPRO CARB STEADY)  237 mL Oral BID BM  . feeding supplement (PRO-STAT SUGAR FREE 64)  30 mL  Oral BID  . ferric citrate  420 mg Oral TID WC  . heparin  5,000 Units Subcutaneous Q8H  . hydrALAZINE  75 mg Oral Q8H  . insulin aspart  0-9 Units Subcutaneous TID WC  . isosorbide dinitrate  5 mg Oral BID  . labetalol  300 mg Oral BID  . multivitamin  1 tablet Oral QHS  . pantoprazole  40 mg Oral Daily  . pravastatin  20 mg Oral Daily  . saccharomyces boulardii  250 mg Oral BID     Dialysis Orders:  [facility].  CCPD: 3L fill 5 exchanges at night with 5th fill manually drained 1-1/2 hours later. Uses mostly 1's and 2's at home.  Background: 46 y.o.right handed malewith history of 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 Presented 10/23/2016 with cellulitus/gangrene R great toe. Placed on broad-spectrum antibiotics. 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 The anterior tibial artery appeared occluded.  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 Hospital course complicated by acute encephalopathy - neurology consulted for altered mental status and questionable seizure. EEG nonspecific cerebral dysfunction and encephalopathy no seizure activity noted and maintained on Depakote. We have provided his PD  Assessment/Plan:  1. Cellulitis, Ischemic R foot / sp R fem-pop bypass w great toe amp on 5/8 - s/p course of IV Vanc Blood cultures no growth. Having issue w/top of bandage/wrap irritating upper end of incision.  2. AMS/delerium - psychosis w agitation - extensive psych history per wife - improved - meds per primary 3. ESRD - CCPD. Use all 2.5% dialysate again today. 4. Hypokalemia - K 3.3 yesterday and rec'd po KDur. No  lab done today (? Cancelled when transferred?) 5. 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 HTN- Continue to use all 2.5% dialysate. BP meds amlodipine/clonidine/hydralazine/labetolol  8. Nutrition - renal vit/protein supp for low album 9. Dispo - PT/OT recommending CIR   Jamal Maes, MD [place] Pager 11/21/2016, 10:21 AM

## 2016-11-21 NOTE — Care Management Note (Signed)
[facility] Individual Statement of Services  Patient Name:  Hayden [name]  Date:  11/21/2016  Welcome to the [facility].  Our goal is to provide you with an individualized program based on your diagnosis and situation, designed to meet your specific needs.  With this comprehensive rehabilitation program, you will be expected to participate in at least 3 hours of rehabilitation therapies Monday-Friday, with modified therapy programming on the weekends.  Your rehabilitation program will include the following services:  Physical Therapy (PT), Occupational Therapy (OT), 24 hour per day rehabilitation nursing, Therapeutic Recreaction (TR), Case Management (Social Worker), Rehabilitation Medicine, Nutrition Services and Pharmacy Services  Weekly team conferences will be held on Wednesday to discuss your progress.  Your Social Worker will talk with you frequently to get your input and to update you on team discussions.  Team conferences with you and your family in attendance may also be held.  Expected length of stay: 5-7 days Overall anticipated outcome: mod/i-supervision with ambulation  Depending on your progress and recovery, your program may change. Your Social Worker will coordinate services and will keep you informed of any changes. Your Social Worker's name and contact numbers are listed  below.  The following services may also be recommended but are not provided by the [facility]:    [place] will be made to provide these services after discharge if needed.  Arrangements include referral to agencies that provide these services.  Your insurance has been verified to be:  [place] Your primary doctor is:  Corliss Parish  Pertinent information will be shared with your doctor and your insurance company.  Social Worker:  Ovidio Kin, [place] or ([phone]  Information discussed with and copy given to patient by: Elease Hashimoto, 11/21/2016, 11:09 AM

## 2016-11-21 NOTE — Progress Notes (Signed)
Occupational Therapy Note  Patient Details  Name: Hayden [name] MRN: [id number] Date of Birth: [birth year]  Today's Date: 11/21/2016 OT Individual Time: 1140-1150 OT Individual Time Calculation (min): 10 min    Today's Date: 11/21/2016 OT Missed Time: 20 Minutes Missed Time Reason: Pain;Unavailable (comment) (MD care)  Pt missed first 10 min of therapy as he was receiving care from the MD.  Pt stated he was in too much pain after the wound care dressing changes to participate. Pt was very apologetic stating he just could not handle it.  Recommended pt return to bed to elevate his legs.  Pt used RW to complete stand pivot to bed with steadying A. Pt adjusted in bed with pillows. Discussed with pt and his wife OT POC.    [place] 11/21/2016, 12:22 PM

## 2016-11-21 NOTE — Progress Notes (Signed)
Initial Nutrition Assessment  DOCUMENTATION CODES:   Not applicable  INTERVENTION:  Continue Nepro Shake po BID, each supplement provides 425 kcal and 19 grams protein.  Continue 30 ml Prostat po BID, each supplement provides 100 kcal and 15 grams of protein.   Encourage adequate PO intake.   NUTRITION DIAGNOSIS:   Increased nutrient needs related to chronic illness as evidenced by estimated needs.  GOAL:   Patient will meet greater than or equal to 90% of their needs  MONITOR:   PO intake, Supplement acceptance, Labs, Weight trends, Skin, I & O's  REASON FOR ASSESSMENT:   Malnutrition Screening Tool    ASSESSMENT:   46 y.o. 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 Presented 10/23/2016 with right foot redness and pain. 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 The anterior tibial artery appears occluded. Underwent angioplasty right superficial femoral artery per vascular surgery 11/05/2016 per Dr. Bridgett Larsson after receiving cardiac clearance as well as right first digit amputation and dorsal ulcer debridement.  Meal completion 35-100%. Pt reports consuming 100% at breakfast this AM. Pt reports no difficulties with his appetite currently and PTA with usual consumption of at least 3 meals a day. Weight loss per weight records likely related to fluid status. Pt currently has Nepro Shake and Prostat ordered. RD to continue with current orders to aid in adequate nutrition. Pt encouraged to eat his food at meals and to take his supplements.   Nutrition-Focused physical exam completed. Findings are severe fat depletion, no muscle depletion, and mild edema.   Labs and medications reviewed.   Diet Order:  Diet renal/carb modified with  fluid restriction Diet-HS Snack? Nothing; Room service appropriate? Yes; Fluid consistency: Thin  Skin:  Wound (see comment) (DTI R foot, incision R foot)  Last BM:  5/23  Height:   Ht Readings from Last 1 Encounters:  10/25/16 5\' 10"  (1.778 m)    Weight:   Wt Readings from Last 1 Encounters:  11/21/16 187 lb 3.9 oz (84.9 kg)    Ideal Body Weight:  75.4 kg  BMI:  Body mass index is 26.87 kg/m.  Estimated Nutritional Needs:   Kcal:  2200-2400  Protein:  110-120 grams  Fluid:  Per MD  EDUCATION NEEDS:   Education needs addressed  Corrin Parker, MS, RD, LDN Pager # [phone] After hours/ weekend pager # [phone]

## 2016-11-22 ENCOUNTER — Inpatient Hospital Stay (HOSPITAL_COMMUNITY): Payer: 59 | Admitting: Occupational Therapy

## 2016-11-22 ENCOUNTER — Inpatient Hospital Stay (HOSPITAL_COMMUNITY): Payer: 59 | Admitting: Physical Therapy

## 2016-11-22 DIAGNOSIS — R5381 Other malaise: Secondary | ICD-10-CM

## 2016-11-22 DIAGNOSIS — I739 Peripheral vascular disease, unspecified: Secondary | ICD-10-CM

## 2016-11-22 LAB — RENAL FUNCTION PANEL
ALBUMIN: 1.8 g/dL — AB (ref 3.5–5.0)
Anion gap: 11 (ref 5–15)
BUN: 51 mg/dL — AB (ref 6–20)
CALCIUM: 9.2 mg/dL (ref 8.9–10.3)
CO2: 26 mmol/L (ref 22–32)
CREATININE: 8.88 mg/dL — AB (ref 0.61–1.24)
Chloride: 101 mmol/L (ref 101–111)
GFR, EST AFRICAN AMERICAN: 7 mL/min — AB (ref >60)
GFR, EST NON AFRICAN AMERICAN: 6 mL/min — AB (ref >60)
Glucose, Bld: 117 mg/dL — ABNORMAL HIGH (ref 65–99)
PHOSPHORUS: 6.3 mg/dL — AB (ref 2.5–4.6)
Potassium: 3.6 mmol/L (ref 3.5–5.1)
Sodium: 138 mmol/L (ref 135–145)

## 2016-11-22 LAB — GLUCOSE, CAPILLARY
GLUCOSE-CAPILLARY: 110 mg/dL — AB (ref 65–99)
GLUCOSE-CAPILLARY: 81 mg/dL (ref 65–99)
GLUCOSE-CAPILLARY: 95 mg/dL (ref 65–99)
Glucose-Capillary: 108 mg/dL — ABNORMAL HIGH (ref 65–99)

## 2016-11-22 LAB — CBC
HCT: 30.4 % — ABNORMAL LOW (ref 39.0–52.0)
Hemoglobin: 9.2 g/dL — ABNORMAL LOW (ref 13.0–17.0)
MCH: 25.6 pg — ABNORMAL LOW (ref 26.0–34.0)
MCHC: 30.3 g/dL (ref 30.0–36.0)
MCV: 84.7 fL (ref 78.0–100.0)
PLATELETS: 611 10*3/uL — AB (ref 150–400)
RBC: 3.59 MIL/uL — AB (ref 4.22–5.81)
RDW: 22.4 % — AB (ref 11.5–15.5)
WBC: 16.3 10*3/uL — AB (ref 4.0–10.5)

## 2016-11-22 MED ORDER — ENSURE ENLIVE PO LIQD
237.0000 mL | Freq: Two times a day (BID) | ORAL | Status: DC
  Administered 2016-11-22 – 2016-11-28 (×7): 237 mL via ORAL

## 2016-11-22 NOTE — Progress Notes (Signed)
[facility]ILIT[facility]     PROGRESS NOTE    Subjective/Complaints:Appreciate  VVS note, changing to dry dressing and may start santyl collagenase next Tues ROS: pt denies nausea, vomiting, diarrhea, cough, shortness of breath or chest pain   Objective: Vital Signs: Blood pressure (!) 147/78, pulse 74, temperature 98.3 F (36.8 C), temperature source Oral, resp. rate 18, weight 85.9 kg (189 lb 4.8 oz), SpO2 99 %. No results found.  Recent Labs  11/20/16 0550 11/22/16 0602  WBC 17.0* 16.3*  HGB 9.4* 9.2*  HCT 30.6* 30.4*  PLT 666* 611*    Recent Labs  11/20/16 0550 11/22/16 0602  NA 138 138  K 3.3* 3.6  CL 99* 101  GLUCOSE 109* 117*  BUN 47* 51*  CREATININE 7.65* 8.88*  CALCIUM 9.0 9.2   CBG (last 3)   Recent Labs  11/21/16 1648 11/21/16 2054 11/22/16 0623  GLUCAP 102* 106* 110*    Wt Readings from Last 3 Encounters:  11/22/16 85.9 kg (189 lb 4.8 oz)  11/19/16 84.8 kg (187 lb)  07/12/16 99.8 kg (220 lb)    Physical Exam:  Constitutional: He appears well-developed and well-nourished.  HENT:  Head: Normocephalic and atraumatic.  Eyes: Conjunctivae and EOM are normal.  Neck: Normal range of motion. Neck supple. No thyromegaly present.  Cardiovascular: Irregular with systolic murmur   Respiratory: Normal effort, CTA bilaterally.  GI: Soft. Bowel sounds are normal. He exhibits no distension.  Musculoskeletal: He exhibits edema and tenderness.  Neurological: He is alert.  More alert and aware A&Ox2, confused Motor: B/l UE, LLE: 5/5 proximal to distal RLE: HF 4/5, KE 4/5.  Wiggles toes Sensation intact to light touch  Skin:  Dressing to RLE. Exposed wound near knee with mild serosanginous drainage Psychiatric: pleasant, a little impulsive.   Assessment/Plan: 1. Functional and cognitive deficits secondary to debility and encephalopathy which require 3+ hours per day of interdisciplinary therapy in a comprehensive inpatient rehab  setting. Physiatrist is providing close team supervision and 24 hour management of active medical problems listed below. Physiatrist and rehab team continue to assess barriers to discharge/monitor patient progress toward functional and medical goals.  Function:  Bathing Bathing position   Position: Wheelchair/chair at sink  Bathing parts Body parts bathed by patient: Right arm, Left arm, Chest, Front perineal area, Right upper leg, Left upper leg, Abdomen, Left lower leg Body parts bathed by helper: Buttocks  Bathing assist Assist Level: Touching or steadying assistance(Pt > 75%)      Upper Body Dressing/Undressing Upper body dressing   What is the patient wearing?: Pull over shirt/dress     Pull over shirt/dress - Perfomed by patient: Thread/unthread right sleeve, Thread/unthread left sleeve, Put head through opening, Pull shirt over trunk          Upper body assist Assist Level: Supervision or verbal cues      Lower Body Dressing/Undressing Lower body dressing   What is the patient wearing?: Pants, Non-skid slipper socks       Pants- Performed by helper: Thread/unthread right pants leg, Pull pants up/down, Thread/unthread left pants leg   Non-skid slipper socks- Performed by helper: Don/doff left sock                  Lower body assist Assist for lower body dressing: Touching or steadying assistance (Pt > 75%)      Toileting Toileting Toileting activity did not occur: Safety/medical concerns Toileting steps completed by patient: Performs perineal hygiene Toileting  steps completed by helper: Adjust clothing prior to toileting, Adjust clothing after toileting Toileting Assistive Devices: Grab bar or rail  Toileting assist Assist level: Touching or steadying assistance (Pt.75%)   Transfers Chair/bed transfer   Chair/bed transfer method: Squat pivot Chair/bed transfer assist level: Supervision or verbal cues       Locomotion Ambulation     Max distance:  30 Assist level: Touching or steadying assistance (Pt > 75%)   Wheelchair   Type: Manual Max wheelchair distance: 150 Assist Level: Supervision or verbal cues  Cognition Comprehension Comprehension assist level: Follows complex conversation/direction with extra time/assistive device  Expression Expression assist level: Expresses complex ideas: With extra time/assistive device  Social Interaction Social Interaction assist level: Interacts appropriately with others with medication or extra time (anti-anxiety, antidepressant).  Problem Solving Problem solving assist level: Solves basic problems with no assist  Memory Memory assist level: Recognizes or recalls 75 - 89% of the time/requires cueing 10 - 24% of the time   Medical Problem List and Plan: 1.  Debilitation secondary to right first digit amputation/acute encephalopathy/multi-medical  -cont CIR PT, OT 2.  DVT Prophylaxis/Anticoagulation: Subcutaneous heparin. Monitor for any bleeding episodes 3. Pain Management: Hydrocodone as needed 4. Mood: Lexapro 10 mg daily, Depakote 500 mg every 12 hours, Ativan as needed 5. Neuropsych: This patient is not capable of making decisions on his own behalf. 6. Skin/Wound Care: compressive dressing RLE  -VVS assist appreciated, DSD until reassessment on Tues then may start Santyl per Dr Bridgett Larsson 7. Fluids/Electrolytes/Nutrition: Routine I&O with follow-up chemistries  -mild hypokalemia 8. ESRD. Continue dialysis as per renal services 9. Hypertension. Clonidine patch 0.2 mg weekly, Isordil 5 mg twice a day, Norvasc 10 mg daily, labetalol 300 mg twice a day, hydralazine 75 mg every 8 hours.  - Vitals:   11/21/16 1513 11/22/16 0600  BP: 137/66 (!) 147/78  Pulse: 91 74  Resp: 20 18  Temp: 98 F (36.7 C) 98.3 F (36.8 C)   10. Diabetes mellitus. Latest hemoglobin A1c 5.4. Check blood sugars before meals and at bedtime.SSI 11. Chronic congestive heart failure. Monitor for any signs of fluid  overload 12. Acute on chronic anemia. Follow-up CBC 13. Tobacco abuse. Counseling 14. Leukocytosis:down  to 16.3K  -wound follow up as above, appreciate vascular assist,   -afebrile    -follow labs/clinically   LOS (Days) 2 A FACE TO FACE EVALUATION WAS PERFORMED  Charlett Blake, MD 11/22/2016 7:54 AM

## 2016-11-22 NOTE — Progress Notes (Signed)
Occupational Therapy Session Note  Patient Details  Name: Hayden [name] MRN: [id number] Date of Birth: 1970/10/26  Today's Date: 11/22/2016  Session 1 OT Individual Time: 1002-1100 OT Individual Time Calculation (min): 58 min   Session 2 OT Individual Time: 8288-3374 OT Individual Time Calculation (min): 72 min    Short Term Goals: Week 1:  OT Short Term Goal 1 (Week 1): STG=LTG 2/2 estimated short LOS  Skilled Therapeutic Interventions/Progress Updates:  Session 1   Pt handoff from PT for OT treatment session focused on transfer training, sit<>stand, and standing balance within BADL task. Pt cleared for shower per vascular note on 5/24. Pt completed squat pivot transfer w/c>tub bench in shower with min guard A and grab bars. OT utilized sialine to remove R LE dressings, and spouse removed PD dressing as she was PTA. Pt completed bathing with overall min A + set-up with care taken to only let water run over wounds. Pt transferred out of shower in similar fashion. LB dressing completed sit<>stand at the sink with Mod A to thread R LE, and pull pants over hips. Min A for standing balance. Pt donned shirt w. Set up, then transferred back to bed with set-up A of wc and min guard A. Pt's spouse re-dressed PD site, then pt left in care of RN to dress R LE wounds.   Session 2 OT treatment session focused on blocked transfer training and standing balance/endurance. Educated pt on wc positioning and safe technique for squat-pivot transfer. Pt required set-up for WC then able to transfer w/ supervision L and R. Dynamic standing balance at the sink while performing grooming tasks with min guard A and hip support on sink. Tolerated ~1.5 mins at longest bout. Pt propelled wc to day room, then continued blocked practice w/ squat-pivot transfers. Pt able to complete squat-pivot wc<>therapy mat<>wc<>bed with mod I by the end of therapy session. He still requires min A for wc set-up and positioning for toilet  transfers and to other surfaces. Pt with increased drainage from R LE incision , RN notified. Pt left semi-reclined in bed at end of session with needs met.   Therapy Documentation Precautions:  Precautions Precautions: Fall Restrictions Weight Bearing Restrictions: No Pain: Pain Assessment Pain Assessment: 0-10 Pain Score: 2  Pain Type: Surgical pain Pain Location: Leg Pain Orientation: Right Pain Radiating Towards: foot Pain Descriptors / Indicators: Aching Pain Frequency: Intermittent Pain Onset: Gradual Patients Stated Pain Goal: 2 Pain Intervention(s): Repositioned ADL: ADL ADL Comments: Please see functional navigator  See Function Navigator for Current Functional Status.   Therapy/Group: Individual Therapy  Valma Cava 11/22/2016, 3:54 PM

## 2016-11-22 NOTE — IPOC Note (Signed)
Overall Plan of Care [facility]) Patient Details Name: Hayden [name] MRN: [id number] DOB: 1970-11-19  Admitting Diagnosis: Encephalopathy  Hospital Problems: Principal Problem:   Acute encephalopathy Active Problems:   End stage renal disease on dialysis ([place])   Gangrene of right foot ([place])   S/P femoral-popliteal bypass surgery   History of CVA (cerebrovascular accident)   Anemia of chronic disease   Debility     Functional Problem List: Nursing Behavior, Bladder, Medication Management, Nutrition, Pain, Safety, Sensory, Skin Integrity  PT Balance, Endurance, Pain  OT Balance, Endurance, Motor, Pain, Safety  SLP    TR         Basic ADL's: OT Grooming, Bathing, Dressing, Toileting     Advanced  ADL's: OT       Transfers: PT Bed Mobility, Bed to Chair, Car, Manufacturing systems engineer, Metallurgist: PT Stairs, Ambulation     Additional Impairments: OT None  SLP None      TR      Anticipated Outcomes Item Anticipated Outcome  Self Feeding    Swallowing  N/A   Basic self-care  Mod I  Toileting  Mod I   Bathroom Transfers Mod I   Bowel/Bladder  MOD I with devices  Transfers  mod I  Locomotion  supervision with RW for household level ambulation  Communication  N/A  Cognition  N/A  Pain  less than 2  Safety/Judgment  Fall free   Therapy Plan: PT Intensity: Minimum of 1-2 x/day ,45 to 90 minutes PT Frequency: 5 out of 7 days PT Duration Estimated Length of Stay: 5-7 days OT Intensity: Minimum of 1-2 x/day, 45 to 90 minutes OT Frequency: 5 out of 7 days OT Duration/Estimated Length of Stay: 5-7 days SLP Duration/Estimated Length of Stay: N/A       Team Interventions: Nursing Interventions Bladder Management, Disease Management/Prevention, Pain Management, Medication Management, Skin Care/Wound Management, Cognitive Remediation/Compensation  PT interventions Ambulation/gait training, Community reintegration, DME/adaptive equipment  instruction, Neuromuscular re-education, Psychosocial support, Stair training, UE/LE Strength taining/ROM, UE/LE Coordination activities, Therapeutic Activities, Discharge planning, Training and development officer, Functional mobility training, Patient/family education, Therapeutic Exercise  OT Interventions Training and development officer, Community reintegration, Discharge planning, DME/adaptive equipment instruction, Functional mobility training, Pain management, Patient/family education, Psychosocial support, Self Care/advanced ADL retraining, Skin care/wound managment, Therapeutic Activities, Therapeutic Exercise, UE/LE Coordination activities, UE/LE Strength taining/ROM, Wheelchair propulsion/positioning  SLP Interventions    TR Interventions    SW/CM Interventions Discharge Planning, Psychosocial Support, Patient/Family Education    Team Discharge Planning: Destination: PT-Home ,OT- Home , SLP-Home Projected Follow-up: PT-Home health PT, 24 hour supervision/assistance, OT-  Home health OT, SLP-None Projected Equipment Needs: PT-To be determined, OT- To be determined (likely RW and shower chair), SLP-None recommended by SLP Equipment Details: PT- , OT-  Patient/family involved in discharge planning: PT- Patient,  OT-Patient, SLP-Patient, Family member/caregiver  MD ELOS: 5-7 days Medical Rehab Prognosis:  Excellent Assessment: The patient has been admitted for CIR therapies with the diagnosis of encephalopathy. The team will be addressing functional mobility, strength, stamina, balance, safety, adaptive techniques and equipment, self-care, bowel and bladder mgt, patient and caregiver education, NMR, cognitive perceptual rx, ego support, community re-entry. Goals have been set at mod I for mobility, self-care, and cognition.    Meredith Staggers, MD, FAAPMR      See Team Conference Notes for weekly updates to the plan of care

## 2016-11-22 NOTE — Progress Notes (Signed)
Hayden [name] Progress Note   Subjective: .  No PD issues (x getting off late - interfering with rehab schedule) Doing well w/rehab so far Leg wound with maceration and Dr. Bridgett Larsson has left plans for different dressings  Objective Vitals:   11/20/16 2339 11/21/16 0554 11/21/16 1513 11/22/16 0600  BP: (!) 167/101 (!) 151/79 137/66 (!) 147/78  Pulse: (!) 119 74 91 74  Resp:  18 20 18   Temp:  98.2 F (36.8 C) 98 F (36.7 C) 98.3 F (36.8 C)  TempSrc:  Oral Oral Oral  SpO2:  98% 98% 99%  Weight:  84.9 kg (187 lb 3.9 oz)  85.9 kg (189 lb 4.8 oz)   Physical Exam WNWD NAD Mentally at baseline No JVD Lungs clear RRR 3/6 systolic murmur  CTAB Abdomen: Soft NT/ND  PD cath exit site with dressing in place No LE edema  R leg upper incision wound is quite macerated and red Has some blackened ischemic looking areas of skin Bloody oooze  Dialysis Access(es):  PD cath in place LUE AVF +bruit    Recent Labs Lab 11/19/16 0749 11/20/16 0550 11/22/16 0602  NA 137 138 138  K 3.0* 3.3* 3.6  CL 98* 99* 101  CO2 27 27 26   GLUCOSE 115* 109* 117*  BUN 42* 47* 51*  CREATININE 7.39* 7.65* 8.88*  CALCIUM 8.7* 9.0 9.2  PHOS 4.5 5.0* 6.3*    Recent Labs Lab 11/19/16 0749 11/20/16 0550 11/22/16 0602  ALBUMIN 1.7* 1.7* 1.8*    Recent Labs Lab 11/17/16 0325 11/19/16 0749 11/20/16 0550 11/22/16 0602  WBC 12.3* 13.6* 17.0* 16.3*  HGB 8.4* 9.3* 9.4* 9.2*  HCT 28.2* 30.6* 30.6* 30.4*  MCV 82.7 84.1 85.5 84.7  PLT 507* 599* 666* 611*     Recent Labs Lab 11/21/16 0625 11/21/16 1139 11/21/16 1648 11/21/16 2054 11/22/16 0623  GLUCAP 103* 114* 102* 106* 110*   Medications: . dialysis solution 2.5% low-MG/low-CA     . amLODipine  10 mg Oral Daily  . aspirin  81 mg Oral Daily  . cinacalcet  60 mg Oral Q supper  . cloNIDine  0.2 mg Transdermal Q Thu  . [START ON 11/24/2016] darbepoetin (ARANESP) injection - NON-DIALYSIS  200 mcg Subcutaneous Q Sun-1800  .  divalproex  500 mg Oral Q12H  . docusate sodium  100 mg Oral Daily  . escitalopram  10 mg Oral Daily  . feeding supplement (NEPRO CARB STEADY)  237 mL Oral BID BM  . feeding supplement (PRO-STAT SUGAR FREE 64)  30 mL Oral BID  . ferric citrate  420 mg Oral TID WC  . heparin  5,000 Units Subcutaneous Q8H  . hydrALAZINE  75 mg Oral Q8H  . insulin aspart  0-9 Units Subcutaneous TID WC  . isosorbide dinitrate  5 mg Oral BID  . labetalol  300 mg Oral BID  . multivitamin  1 tablet Oral QHS  . pantoprazole  40 mg Oral Daily  . pravastatin  20 mg Oral Daily  . saccharomyces boulardii  250 mg Oral BID     Dialysis Orders:  [facility].  CCPD: 3L fill 5 exchanges at night with 5th fill manually drained 1-1/2 hours later. Uses mostly 1's and 2's at home.  Background: 46 y.o.w/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 Presented 10/23/2016 with cellulitus/gangrene R great toe. Placed on broad-spectrum antibiotics. 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 Hospital course complicated by acute encephalopathy - neurology consulted EEG nonspecific cerebral dysfunction and encephalopathy no seizure activity noted Maintained on Depakote. We have provided his PD->now on rehab  Assessment/Plan:  1. Cellulitis, Ischemic R foot / sp R fem-pop bypass w great toe amp on 5/8 - s/p course of IV Vanc Blood cultures no growth. Wound macerated 2/2 dressing w/some ischemic changes - Dr. Bridgett Larsson has left recs. 2. AMS/delerium - psychosis w agitation - extensive psych history per wife - resolved and back at  baseline 3. ESRD - CCPD. Using all 2.5% dialysate. Continue same. 4. Hypokalemia - Rec'd KDur past 2 days. Today K 3.6.  5. 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  7. HTN- Continue to use all 2.5% dialysate. BP meds amlodipine/clonidine/hydralazine/labetolol  8. Nutrition - renal vit/protein supp for low albumin. Dislikes prostat/Nepro. Wife wants to try Ensure. Kismet, MD [place] Pager 11/22/2016, 10:34 AM

## 2016-11-22 NOTE — Progress Notes (Signed)
Physical Therapy Session Note  Patient Details  Name: Hayden [name] MRN: [id number] Date of Birth: 08/29/1970  Today's Date: 11/22/2016 PT Individual Time: 0935-1000 PT Individual Time Calculation (min): 25 min   Short Term Goals: Week 1:   =LTGs due to ELOS  Skilled Therapeutic Interventions/Progress Updates:    PT arrived at 9AM per schedule and pt awaiting to be disconnected from PD, so missed first 35 minutes of therapy session.  No c/o pain, session focus on stair negotiation for home entry.    Pt transitioned supine<>sit mod I and bed>w/c with supervision, min verbal cues for w/c set up.  Pt negotiates 1 step with 2 rails, steady assist and verbal cues for w/c set up, and sequencing.  Discussed different options for stair negotiation as well as potential improvement in stability with addition of off-loading shoe on next trial.  Pt continues to demo poor frustration tolerance with decreased independence and with difficulty in mobility, PT provided emotional support throughout session as needed.  Handoff to OT in therapy gym.   Therapy Documentation Precautions:  Precautions Precautions: Fall Restrictions Weight Bearing Restrictions: No General: PT Amount of Missed Time (min): 35 Minutes PT Missed Treatment Reason: Unavailable (Comment) (pt awaiting to be removed from PD)   See Function Navigator for Current Functional Status.   Therapy/Group: Individual Therapy  Earnest Conroy Penven-Crew 11/22/2016, 10:37 AM

## 2016-11-22 NOTE — Progress Notes (Signed)
Orthopedic Tech Progress Note Patient Details:  Hayden [name] [birth year] [id number]  Patient ID: Hayden [name], male   DOB: [birth year], 46 y.o.   MRN: [id number]   Hayden [name] 11/22/2016, 9:29 AMCalled [employer] for right Off loading shoe.

## 2016-11-23 DIAGNOSIS — I96 Gangrene, not elsewhere classified: Secondary | ICD-10-CM

## 2016-11-23 DIAGNOSIS — N186 End stage renal disease: Secondary | ICD-10-CM

## 2016-11-23 DIAGNOSIS — D638 Anemia in other chronic diseases classified elsewhere: Secondary | ICD-10-CM

## 2016-11-23 DIAGNOSIS — R5381 Other malaise: Secondary | ICD-10-CM

## 2016-11-23 DIAGNOSIS — Z8673 Personal history of transient ischemic attack (TIA), and cerebral infarction without residual deficits: Secondary | ICD-10-CM

## 2016-11-23 DIAGNOSIS — Z992 Dependence on renal dialysis: Secondary | ICD-10-CM

## 2016-11-23 LAB — RENAL FUNCTION PANEL
ANION GAP: 11 (ref 5–15)
Albumin: 1.8 g/dL — ABNORMAL LOW (ref 3.5–5.0)
BUN: 57 mg/dL — ABNORMAL HIGH (ref 6–20)
CALCIUM: 9.2 mg/dL (ref 8.9–10.3)
CO2: 28 mmol/L (ref 22–32)
CREATININE: 9.45 mg/dL — AB (ref 0.61–1.24)
Chloride: 100 mmol/L — ABNORMAL LOW (ref 101–111)
GFR, EST AFRICAN AMERICAN: 7 mL/min — AB (ref >60)
GFR, EST NON AFRICAN AMERICAN: 6 mL/min — AB (ref >60)
Glucose, Bld: 112 mg/dL — ABNORMAL HIGH (ref 65–99)
Phosphorus: 6.1 mg/dL — ABNORMAL HIGH (ref 2.5–4.6)
Potassium: 3.8 mmol/L (ref 3.5–5.1)
SODIUM: 139 mmol/L (ref 135–145)

## 2016-11-23 LAB — GLUCOSE, CAPILLARY
GLUCOSE-CAPILLARY: 110 mg/dL — AB (ref 65–99)
GLUCOSE-CAPILLARY: 99 mg/dL (ref 65–99)
GLUCOSE-CAPILLARY: 100 mg/dL — AB (ref 65–99)
Glucose-Capillary: 90 mg/dL (ref 65–99)

## 2016-11-23 NOTE — Progress Notes (Signed)
Hayden [name] is a 46 y.o. male [birth year] [id number]  Subjective:  No new problems. Slept well.   Objective: Vital signs in last 24 hours: Temp:  [98 F (36.7 C)-98.3 F (36.8 C)] 98 F (36.7 C) (05/26 1219) Pulse Rate:  [77-83] 77 (05/26 1219) Resp:  [16] 16 (05/26 1219) BP: (148-173)/(68-73) 173/73 (05/26 1219) SpO2:  [97 %-100 %] 97 % (05/26 1219) Weight:  [177 lb 14.6 oz (80.7 kg)-189 lb 8.7 oz (86 kg)] 177 lb 14.6 oz (80.7 kg) (05/26 1219) Weight change: 3.9 oz (0.111 kg) Last BM Date: 11/20/16  Intake/Output from previous day: 05/25 0701 - 05/26 0700 In: 11603 [P.O.:600] Out: 10831 [Urine:200; Stool:1] Last cbgs: CBG (last 3)   Recent Labs  11/23/16 0626 11/23/16 1126 11/23/16 1633  GLUCAP 110* 99 100*     Physical Exam General: No apparent distress   HEENT: not dry Lungs: Normal effort. Lungs clear to auscultation, no crackles or wheezes. Cardiovascular: Regular rate and rhythm, no edema Abdomen: S/NT/ND; BS(+) PD cath Musculoskeletal:  unchanged Neurological: No new neurological deficits Wounds: dressed Skin: clear  Aging changes Mental state: Asleep    Lab Results: BMET    Component Value Date/Time   NA 139 11/23/2016 0424   K 3.8 11/23/2016 0424   CL 100 (L) 11/23/2016 0424   CO2 28 11/23/2016 0424   GLUCOSE 112 (H) 11/23/2016 0424   BUN 57 (H) 11/23/2016 0424   CREATININE 9.45 (H) 11/23/2016 0424   CALCIUM 9.2 11/23/2016 0424   GFRNONAA 6 (L) 11/23/2016 0424   GFRAA 7 (L) 11/23/2016 0424   CBC    Component Value Date/Time   WBC 16.3 (H) 11/22/2016 0602   RBC 3.59 (L) 11/22/2016 0602   HGB 9.2 (L) 11/22/2016 0602   HCT 30.4 (L) 11/22/2016 0602   PLT 611 (H) 11/22/2016 0602   MCV 84.7 11/22/2016 0602   MCH 25.6 (L) 11/22/2016 0602   MCHC 30.3 11/22/2016 0602   RDW 22.4 (H) 11/22/2016 0602   LYMPHSABS 0.9 11/01/2016 0425   MONOABS 1.5 (H) 11/01/2016 0425   EOSABS 1.7 (H) 11/01/2016 0425   BASOSABS 0.1 11/01/2016 0425     Studies/Results: No results found.  Medications: I have reviewed the patient's current medications.  Assessment/Plan:  1. Debility - cont w/CIR 2. DVT proph w/Heparin 3. Pain - on Norco prn 4. Depression - Lexapro, Depakote 5. ESRD - on PD 6. HTN. Clonidine patch, Isordil, Norvasc, Labetalol, Hydralazine 7. Anemia. Monitoring CBC 8. DM -  SSI       Length of stay, days: 3  Walker Kehr , MD 11/23/2016, 5:05 PM

## 2016-11-23 NOTE — Progress Notes (Signed)
[facility] Progress Note   Subjective: .  No PD issues (x getting off late - interfering with rehab schedule) Issue again today with getting off machine late Leg wound with maceration and Dr. Bridgett Larsson left plans for different dressings  Objective Vitals:   11/21/16 1513 11/22/16 0600 11/22/16 1418 11/23/16 0517  BP: 137/66 (!) 147/78 132/62 (!) 148/68  Pulse: 91 74 88 83  Resp: 20 18 18 16   Temp: 98 F (36.7 C) 98.3 F (36.8 C) 98.5 F (36.9 C) 98.3 F (36.8 C)  TempSrc: Oral Oral Oral Oral  SpO2: 98% 99% 100% 100%  Weight:  85.9 kg (189 lb 4.8 oz)  86 kg (189 lb 8.7 oz)   Physical Exam WNWD NAD Mentally at baseline No JVD Lungs clear RRR 3/6 systolic murmur  CTAB Abdomen: Soft NT/ND  PD cath exit site with dressing in place No LE edema  R leg upper incision wound is quite macerated and red Has some blackened ischemic looking areas of skin Bloody oooze  Dialysis Access(es):  PD cath in place LUE AVF +bruit    Recent Labs Lab 11/20/16 0550 11/22/16 0602 11/23/16 0424  NA 138 138 139  K 3.3* 3.6 3.8  CL 99* 101 100*  CO2 27 26 28   GLUCOSE 109* 117* 112*  BUN 47* 51* 57*  CREATININE 7.65* 8.88* 9.45*  CALCIUM 9.0 9.2 9.2  PHOS 5.0* 6.3* 6.1*    Recent Labs Lab 11/20/16 0550 11/22/16 0602 11/23/16 0424  ALBUMIN 1.7* 1.8* 1.8*    Recent Labs Lab 11/17/16 0325 11/19/16 0749 11/20/16 0550 11/22/16 0602  WBC 12.3* 13.6* 17.0* 16.3*  HGB 8.4* 9.3* 9.4* 9.2*  HCT 28.2* 30.6* 30.6* 30.4*  MCV 82.7 84.1 85.5 84.7  PLT 507* 599* 666* 611*     Recent Labs Lab 11/22/16 0623 11/22/16 1130 11/22/16 1647 11/22/16 2050 11/23/16 0626  GLUCAP 110* 81 95 108* 110*   Medications: . dialysis solution 2.5% low-MG/low-CA     . amLODipine  10 mg Oral Daily  . aspirin  81 mg Oral Daily  . cinacalcet  60 mg Oral Q supper  . cloNIDine  0.2 mg Transdermal Q Thu  . [START ON 11/24/2016] darbepoetin (ARANESP) injection - NON-DIALYSIS  200  mcg Subcutaneous Q Sun-1800  . divalproex  500 mg Oral Q12H  . docusate sodium  100 mg Oral Daily  . escitalopram  10 mg Oral Daily  . feeding supplement (ENSURE ENLIVE)  237 mL Oral BID BM  . feeding supplement (NEPRO CARB STEADY)  237 mL Oral BID BM  . ferric citrate  420 mg Oral TID WC  . heparin  5,000 Units Subcutaneous Q8H  . hydrALAZINE  75 mg Oral Q8H  . insulin aspart  0-9 Units Subcutaneous TID WC  . isosorbide dinitrate  5 mg Oral BID  . labetalol  300 mg Oral BID  . multivitamin  1 tablet Oral QHS  . pantoprazole  40 mg Oral Daily  . pravastatin  20 mg Oral Daily  . saccharomyces boulardii  250 mg Oral BID     Dialysis Orders:  [facility].  CCPD: 3L fill 5 exchanges at night with 5th fill manually drained 1-1/2 hours later. Uses mostly 1's and 2's at home.  Background: 46 y.o.w/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 Presented 10/23/2016 with cellulitus/gangrene R great toe. 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 Hospital course complicated by acute encephalopathy - EEG nonspecific cerebral dysfunction and encephalopathy no seizure activity noted. Maintained on Depakote. We have provided his PD->now on rehab  Assessment/Plan:  1. Cellulitis, Ischemic R foot / sp R fem-pop bypass w great toe amp on 5/8 - s/p course of IV Vanc Blood cultures no growth. Wound macerated 2/2 dressing w/some ischemic changes - Dr. Bridgett Larsson has left recs. 2. AMS/delerium - psychosis w agitation - extensive psych history per wife - resolved and back at baseline 3. ESRD - CCPD. Using all  2.5% dialysate. Continue same. 4. Hypokalemia - Rec'd KDur past 2 days. Today K 3.6.  5. 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  7. HTN- Continue to use all 2.5% dialysate. BP meds amlodipine/clonidine/hydralazine/labetolol  8. Nutrition - renal vit/protein supp for low albumin. Dislikes prostat/Nepro. Trying Ensure   Jamal Maes, MD [place] Pager 11/23/2016, 10:56 AM

## 2016-11-24 ENCOUNTER — Inpatient Hospital Stay (HOSPITAL_COMMUNITY): Payer: Medicare Other | Admitting: Occupational Therapy

## 2016-11-24 ENCOUNTER — Inpatient Hospital Stay (HOSPITAL_COMMUNITY): Payer: Medicare Other | Admitting: Physical Therapy

## 2016-11-24 LAB — GLUCOSE, CAPILLARY: GLUCOSE-CAPILLARY: 99 mg/dL (ref 65–99)

## 2016-11-24 MED ORDER — GABAPENTIN 100 MG PO CAPS
100.0000 mg | ORAL_CAPSULE | Freq: Three times a day (TID) | ORAL | Status: DC | PRN
  Administered 2016-11-24 – 2016-11-25 (×2): 100 mg via ORAL
  Filled 2016-11-24 (×2): qty 1

## 2016-11-24 NOTE — Progress Notes (Signed)
Hayden [name] is a 46 y.o. male [birth year] [id number]  Subjective:  No new problems.   Objective: Vital signs in last 24 hours: Temp:  [98.3 F (36.8 C)-98.5 F (36.9 C)] 98.3 F (36.8 C) (05/27 0940) Pulse Rate:  [75-87] 87 (05/27 1319) Resp:  [16-18] 18 (05/27 0940) BP: (137-183)/(58-77) 137/58 (05/27 1319) SpO2:  [97 %-100 %] 100 % (05/27 0940) Weight:  [178 lb 2.1 oz (80.8 kg)-184 lb 8.4 oz (83.7 kg)] 184 lb 8.4 oz (83.7 kg) (05/27 0940) Weight change: -11 lb 10.1 oz (-5.277 kg) Last BM Date: 11/23/16  Intake/Output from previous day: 05/26 0701 - 05/27 0700 In: 360 [P.O.:360] Out: 100 [Urine:100] Last cbgs: CBG (last 3)   Recent Labs  11/23/16 1633 11/23/16 2031 11/24/16 0633  GLUCAP 100* 90 99     Physical Exam General: No apparent distress   HEENT: not dry Lungs: Normal effort. Lungs clear to auscultation, no crackles or wheezes. Cardiovascular: Regular rate and rhythm, no edema Abdomen: S/NT/ND; BS(+) Musculoskeletal:  unchanged Neurological: No new neurological deficits Wounds: N/A    Skin: clear Mental state: Asleep Wife in the room    Lab Results: BMET    Component Value Date/Time   NA 139 11/23/2016 0424   K 3.8 11/23/2016 0424   CL 100 (L) 11/23/2016 0424   CO2 28 11/23/2016 0424   GLUCOSE 112 (H) 11/23/2016 0424   BUN 57 (H) 11/23/2016 0424   CREATININE 9.45 (H) 11/23/2016 0424   CALCIUM 9.2 11/23/2016 0424   GFRNONAA 6 (L) 11/23/2016 0424   GFRAA 7 (L) 11/23/2016 0424   CBC    Component Value Date/Time   WBC 16.3 (H) 11/22/2016 0602   RBC 3.59 (L) 11/22/2016 0602   HGB 9.2 (L) 11/22/2016 0602   HCT 30.4 (L) 11/22/2016 0602   PLT 611 (H) 11/22/2016 0602   MCV 84.7 11/22/2016 0602   MCH 25.6 (L) 11/22/2016 0602   MCHC 30.3 11/22/2016 0602   RDW 22.4 (H) 11/22/2016 0602   LYMPHSABS 0.9 11/01/2016 0425   MONOABS 1.5 (H) 11/01/2016 0425   EOSABS 1.7 (H) 11/01/2016 0425   BASOSABS 0.1 11/01/2016 0425    Studies/Results: No  results found.  Medications: I have reviewed the patient's current medications.  Assessment/Plan:  1. Debility - CIR 2. DVT proph w/Heparin 3. Pain control - Norco prn 4. Depression - Lexapro and Depakote 5. ESRD - on PD 6. HTN - Clonidine, Isordil, Norvasc, Labetalol, Hydralazine 7. DM - SSI     Length of stay, days: 4  Walker Kehr , MD 11/24/2016, 2:40 PM

## 2016-11-24 NOTE — Plan of Care (Signed)
Problem: RH SKIN INTEGRITY Goal: RH STG ABLE TO PERFORM INCISION/WOUND CARE W/ASSISTANCE STG Able To Perform Incision/Wound Care With [employer].   Outcome: Not Progressing Pt not participating with wound/incision care

## 2016-11-24 NOTE — Progress Notes (Signed)
Physical Therapy Session Note  Patient Details  Name: Hayden [name] MRN: [id number] Date of Birth: 01-15-1971  Today's Date: 11/24/2016 PT Individual Time: 1120-1159 PT Individual Time Calculation (min): 39 min    Skilled Therapeutic Interventions/Progress Updates:  Pt received asleep in bed with family present. Pt easily awakened & agreeable to treatment. Pt's RLE dressing was falling off therefore RN replaced it - pt missed 21 minutes of skilled PT treatment 2/2 nursing care. Educated pt on donning of RLE off loading shoe. Pt completed bed>w/c transfer via squat pivot with supervision assist and total assist for w/c set up. In gym pt agreeable to gait training & ambulated 4 ft with RW & min assist. Pt initially maintains NWB RLE then transitions to weight bearing somewhat through front of RLE despite education on need to weight bear through heel. Pt requires seated rest break 2/2 pain causing nausea. Pt reports he is in so much pain he could almost vomit but strongly wishes to continue participation in tx. Pt utilized BUE ergometer up to level 3 x 5 minutes forwards + 5 minutes backwards for endurance training. At end of session pt left sitting in w/c with family escorting him to family room & RN aware.   Therapy Documentation Precautions:  Precautions Precautions: Fall Restrictions Weight Bearing Restrictions: No  General: PT Amount of Missed Time (min): 21 Minutes PT Missed Treatment Reason: Nursing care  Pain: 7-8/10 pain in RLE - RN made aware & reports pt is premedicated.  See Function Navigator for Current Functional Status.   Therapy/Group: Individual Therapy  Waunita Schooner 11/24/2016, 12:27 PM

## 2016-11-24 NOTE — Progress Notes (Signed)
Occupational Therapy Session Note  Patient Details  Name: NKOSI CORTRIGHT MRN: [id number] Date of Birth: Jan 15, 1971  Today's Date: 11/24/2016 OT Individual Time:  - 0800-0915  1st session                                        1430-1530  2nd session      Short Term Goals: Week 1:  OT Short Term Goal 1 (Week 1): STG=LTG 2/2 estimated short LOS      Skilled Therapeutic Interventions/Progress Updates:    session:  Ppt lying in bed upon OT arrival.  Ppt complained of right leg, but willing to exercise.  Performed ankle, knee flexing and extension.  Pt doffed foot dressing and wanted to air out his wound.  Performed long leg sitting in bed with no back support for 1 -2 minutes.  Performed hip adduction and bilateral isometric contraction. Waited on Dialysis to [facility] pt.  Did isometric contraction with hip flexion, abduction and knee flexion.  Pt  Performed supine to sit with no assist.  Sat EOB for 1 minute before placing right foot back on bed.  RN administed pain meds.  Ppt did not get up but remained sitting up with pt rubbing legs and left foot.  ]   2nd session:  Addressed transfers, wc mobility, sit to stand, UE therapeutic exercises.  Pt transferred from bed to wc with supervision.  Ppt donned boot and footies.  Attempted sit to stand and ambulation, but pain shot up to 9/10.  Pt propelled wc to gym.  Engaged in therapeutic activities for UE Strength, and ROM.  Performed froward pass for10 minutes with one rest break at 5 minutes.  Did catch for 15 minutes with 4 rest breaks.for ball retrieval by therapist.  Did UE therex using 5 and 6 # weights.  Pt propelled wc back to room and left in wc with wife present.   Therapy Documentation Precautions:  Precautions Precautions: Fall Restrictions Weight Bearing Restrictions: No General:   Vital Signs: Therapy Vitals Temp: 98.5 F (36.9 C) Temp Source: Oral Pulse Rate: 77 Resp: 16 BP: (!) 165/77 Patient Position (if appropriate):  Lying Oxygen Therapy SpO2: 98 % O2 Device: Not Delivered Pain:  9/10 right leg   ADL: ADL ADL Comments: Please see functional navigator Vision   Perception    Praxis   Exercises:   Other Treatments:    See Function Navigator for Current Functional Status.   Therapy/Group: Individual Therapy  Lisa Roca 11/24/2016, 8:00 AM

## 2016-11-24 NOTE — Progress Notes (Signed)
[facility] Progress Note   Subjective: .   Just getting unhooked from cycler (10AM) but was done about 8 Foot hurts but says o/w feels OK Eating well plus food from outside (I recommended)  Objective Vitals:   11/23/16 0517 11/23/16 1219 11/23/16 1813 11/24/16 0510  BP: (!) 148/68 (!) 173/73 (!) 158/74 (!) 165/77  Pulse: 83 77 75 77  Resp: 16 16 17 16   Temp: 98.3 F (36.8 C) 98 F (36.7 C)  98.5 F (36.9 C)  TempSrc: Oral Oral  Oral  SpO2: 100% 97% 97% 98%  Weight: 86 kg (189 lb 8.7 oz) 80.7 kg (177 lb 14.6 oz)  80.8 kg (178 lb 2.1 oz)   Physical Exam NAD Awake, alert, wants to go outside No JVD Lungs clear RRR 3/6 systolic murmur  Abdomen: Soft NT/ND  PD cath exit site with dressing in place No LE edema  R lower  leg upper end of  incision - wound not unwrapped (yesterday was quite macerated and red with some blackened ischemic looking areas of skin; Bloody ooze)  Dialysis Access(es):  PD cath in place LUE AVF +bruit    Recent Labs Lab 11/20/16 0550 11/22/16 0602 11/23/16 0424  NA 138 138 139  K 3.3* 3.6 3.8  CL 99* 101 100*  CO2 27 26 28   GLUCOSE 109* 117* 112*  BUN 47* 51* 57*  CREATININE 7.65* 8.88* 9.45*  CALCIUM 9.0 9.2 9.2  PHOS 5.0* 6.3* 6.1*    Recent Labs Lab 11/20/16 0550 11/22/16 0602 11/23/16 0424  ALBUMIN 1.7* 1.8* 1.8*    Recent Labs Lab 11/19/16 0749 11/20/16 0550 11/22/16 0602  WBC 13.6* 17.0* 16.3*  HGB 9.3* 9.4* 9.2*  HCT 30.6* 30.6* 30.4*  MCV 84.1 85.5 84.7  PLT 599* 666* 611*     Recent Labs Lab 11/23/16 0626 11/23/16 1126 11/23/16 1633 11/23/16 2031 11/24/16 0633  GLUCAP 110* 99 100* 90 99   Medications: . dialysis solution 2.5% low-MG/low-CA     . amLODipine  10 mg Oral Daily  . aspirin  81 mg Oral Daily  . cinacalcet  60 mg Oral Q supper  . cloNIDine  0.2 mg Transdermal Q Thu  . darbepoetin (ARANESP) injection - NON-DIALYSIS  200 mcg Subcutaneous Q Sun-1800  . divalproex  500 mg  Oral Q12H  . docusate sodium  100 mg Oral Daily  . escitalopram  10 mg Oral Daily  . feeding supplement (ENSURE ENLIVE)  237 mL Oral BID BM  . feeding supplement (NEPRO CARB STEADY)  237 mL Oral BID BM  . ferric citrate  420 mg Oral TID WC  . heparin  5,000 Units Subcutaneous Q8H  . hydrALAZINE  75 mg Oral Q8H  . insulin aspart  0-9 Units Subcutaneous TID WC  . isosorbide dinitrate  5 mg Oral BID  . labetalol  300 mg Oral BID  . multivitamin  1 tablet Oral QHS  . pantoprazole  40 mg Oral Daily  . pravastatin  20 mg Oral Daily  . saccharomyces boulardii  250 mg Oral BID     Dialysis Orders:  [facility].  CCPD: 3L fill 5 exchanges at night with 5th fill manually drained 1-1/2 hours later. Uses mostly 1's and 2's at home.  Background: 46 y.o.w/ESRD on CCPD, tobacco abuse, a/cCHF, HTN, h/o CVA. 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 Hospital course complicated by acute encephalopathy/mania resolved.  Rehab for deconditioning.   Assessment/Plan:  1. Ischemic R foot / sp R fem-pop bypass w great toe amp on 5/8 - s/p course of IV Vanc Blood cultures no growth. Wound macerated 2/2 dressing w/some ischemic changes - Dr. Bridgett Larsson has left recs. See 5/24 note. 2. AMS/delerium - psychosis w agitation - extensive psych history per wife - resolved and back at baseline 3. ESRD - CCPD. Using all 2.5% dialysate. Continue same. 4. Hypokalemia - Resolved. Replete prn. 5. 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   7. HTN- Continue to use all 2.5% dialysate. BP meds  amlodipine/clonidine/hydralazine/labetolol  8. Nutrition - renal vit/protein supp for low albumin. Dislikes prostat/Nepro. Trying Ensure 9. Dispo - anticipated d/c date 11/29/16   Jamal Maes, MD [facility] [phone] Pager 11/24/2016, 10:03 AM

## 2016-11-25 ENCOUNTER — Inpatient Hospital Stay (HOSPITAL_COMMUNITY): Payer: Medicare Other | Admitting: Physical Therapy

## 2016-11-25 ENCOUNTER — Inpatient Hospital Stay (HOSPITAL_COMMUNITY): Payer: 59 | Admitting: Occupational Therapy

## 2016-11-25 ENCOUNTER — Inpatient Hospital Stay (HOSPITAL_COMMUNITY): Payer: 59 | Admitting: Physical Therapy

## 2016-11-25 DIAGNOSIS — D72829 Elevated white blood cell count, unspecified: Secondary | ICD-10-CM

## 2016-11-25 DIAGNOSIS — Z95828 Presence of other vascular implants and grafts: Secondary | ICD-10-CM

## 2016-11-25 DIAGNOSIS — E1152 Type 2 diabetes mellitus with diabetic peripheral angiopathy with gangrene: Secondary | ICD-10-CM

## 2016-11-25 DIAGNOSIS — I150 Renovascular hypertension: Secondary | ICD-10-CM

## 2016-11-25 LAB — CBC
HCT: 28 % — ABNORMAL LOW (ref 39.0–52.0)
Hemoglobin: 8.4 g/dL — ABNORMAL LOW (ref 13.0–17.0)
MCH: 26 pg (ref 26.0–34.0)
MCHC: 30 g/dL (ref 30.0–36.0)
MCV: 86.7 fL (ref 78.0–100.0)
PLATELETS: 547 10*3/uL — AB (ref 150–400)
RBC: 3.23 MIL/uL — AB (ref 4.22–5.81)
RDW: 21.8 % — ABNORMAL HIGH (ref 11.5–15.5)
WBC: 16.6 10*3/uL — ABNORMAL HIGH (ref 4.0–10.5)

## 2016-11-25 LAB — RENAL FUNCTION PANEL
Albumin: 1.7 g/dL — ABNORMAL LOW (ref 3.5–5.0)
Anion gap: 12 (ref 5–15)
BUN: 63 mg/dL — AB (ref 6–20)
CHLORIDE: 100 mmol/L — AB (ref 101–111)
CO2: 24 mmol/L (ref 22–32)
CREATININE: 8.66 mg/dL — AB (ref 0.61–1.24)
Calcium: 8.3 mg/dL — ABNORMAL LOW (ref 8.9–10.3)
GFR calc non Af Amer: 7 mL/min — ABNORMAL LOW (ref >60)
GFR, EST AFRICAN AMERICAN: 8 mL/min — AB (ref >60)
Glucose, Bld: 113 mg/dL — ABNORMAL HIGH (ref 65–99)
Phosphorus: 5.4 mg/dL — ABNORMAL HIGH (ref 2.5–4.6)
Potassium: 4.4 mmol/L (ref 3.5–5.1)
Sodium: 136 mmol/L (ref 135–145)

## 2016-11-25 LAB — GLUCOSE, CAPILLARY: Glucose-Capillary: 126 mg/dL — ABNORMAL HIGH (ref 65–99)

## 2016-11-25 MED ORDER — GABAPENTIN 100 MG PO CAPS
100.0000 mg | ORAL_CAPSULE | Freq: Three times a day (TID) | ORAL | Status: DC
  Administered 2016-11-25 – 2016-11-28 (×10): 100 mg via ORAL
  Filled 2016-11-25 (×10): qty 1

## 2016-11-25 MED ORDER — HYDRALAZINE HCL 50 MG PO TABS
100.0000 mg | ORAL_TABLET | Freq: Three times a day (TID) | ORAL | Status: DC
  Administered 2016-11-25 – 2016-11-28 (×10): 100 mg via ORAL
  Filled 2016-11-25 (×10): qty 2

## 2016-11-25 NOTE — Progress Notes (Addendum)
Physical Therapy Session Note  Patient Details  Name: Hayden [name] MRN: [id number] Date of Birth: October 08, 1970  Today's Date: 11/25/2016 PT Individual Time: 1420-1530 PT Individual Time Calculation (min): 70 min   Skilled Therapeutic Interventions/Progress Updates:  Pt received asleep in bed but easily awakened. Pt's wife Hayden [name]) present for session. Pt transferred to sitting EOB with mod [name] assisted pt with w/c set up and provided supervision for bed>w/c transfer. Pt propelled w/c to ortho gym and reported need to use restroom. [name] assisted pt with transfer and toileting without assistance from PT (they had been checked off to complete toilet transfers prior to this session). Session focused on functional transfers (car, furniture). Pt completed car transfer from low sedan simulated height with supervision overall. Transitioned to day room & pt completed transfer to low rocking reclining chair and low couch. Pt with 1 LOB 2/2 catching shoe on carpet and required assistance to safely sit in recliner when backing up to it; pt able to complete transfer with supervision during second trial. Pt also required multiple attempts to transfer sit>stand from furniture but able to complete with supervision overall. Pt ambulates over various surfaces (tile & carpet) with RW & close supervision, with pt self selecting NWB RLE or minimal weight bearing through extremity. [name] actively participating in session, providing supervision when pt was ambulating, and assistance/cuing with w/c set up/management. Pt requires frequent rest breaks 2/2 fatigue & pain. At end of session pt left sitting in w/c in room with wife present to supervise.   Throughout session pt requires min cuing for hand placement when completing sit<>stand transfers from various pieces of furniture.   Addendum: Pt reports his house has an open floor plan that can accommodate w/c but he plans to ambulate within home.  Therapy  Documentation Precautions:  Precautions Precautions: Fall Restrictions Weight Bearing Restrictions: No  Pain: 9/10 RLE - breaks provided PRN & RN administered meds during session.   See Function Navigator for Current Functional Status.   Therapy/Group: Individual Therapy  Waunita Schooner 11/25/2016, 3:45 PM

## 2016-11-25 NOTE — Progress Notes (Signed)
Background: 46 y.o.w/ESRD on CCPD, tobacco abuse, a/cCHF, HTN, h/o CVA. 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 Hospital course complicated by acute encephalopathy/mania resolved.  Rehab for deconditioning.   Assessment/Plan:  1. Ischemic R foot / sp R fem-pop bypass w great toe amp on 5/8 - s/p course of IVVanc Blood cultures no growth. Wound macerated 2/2 dressing w/some ischemic changes - Dr. Bridgett [name] has left recs. See 5/24 note. 2. AMS/delerium - resolved and back at baseline 3. ESRD - CCPD. Using all 2.5% dialysate. Continue same. 4. Hypokalemia - Resolved. Replete prn. 5. 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   7. HTN- Continue to use all 2.5% dialysate. BP meds amlodipine/clonidine/hydralazine/labetolol  8. Dispo - anticipated d/c date 11/29/16  Subjective: Interval History: Tolerating CCPD.  Objective: Vital signs in last 24 hours: Temp:  [97.8 F (36.6 C)-98.1 F (36.7 C)] 98.1 F (36.7 C) (05/28 0536) Pulse Rate:  [81-87] 83 (05/28 0536) Resp:  [18] 18 (05/28 0536) BP: (137-175)/(58-77) 175/77 (05/28 0536) SpO2:  [95 %-98 %] 98 % (05/28 0536) Weight:  [83.7 kg (184 lb 8.4 oz)-83.7 kg (184 lb 9.2 oz)] 83.7 kg (184 lb 9.2 oz) (05/28 0536) Weight change: 3 kg (6 lb 9.8 oz)  Intake/Output from previous day: 05/27 0701 - 05/28 0700 In: 15220 [P.O.:240] Out: 17539  Intake/Output this shift: Total I/O In: 14980 [Other:14980] Out: 17229 [Other:17229]  General appearance: slowed mentation and sleepy Head: Normocephalic, without obvious abnormality, atraumatic Resp: clear to  auscultation bilaterally Chest wall: no tenderness Cardio: systolic murmur: systolic ejection 3/6, crescendo and decrescendo at lower left sternal border Extremities: right foot bandaged  Lab Results: No results for input(s): WBC, HGB, HCT, PLT in the last 72 hours. BMET:  Recent Labs  11/23/16 0424  NA 139  K 3.8  CL 100*  CO2 28  GLUCOSE 112*  BUN 57*  CREATININE 9.45*  CALCIUM 9.2   No results for input(s): PTH in the last 72 hours. Iron Studies: No results for input(s): IRON, TIBC, TRANSFERRIN, FERRITIN in the last 72 hours. Studies/Results: No results found.  Scheduled: . amLODipine  10 mg Oral Daily  . aspirin  81 mg Oral Daily  . cinacalcet  60 mg Oral Q supper  . cloNIDine  0.2 mg Transdermal Q Thu  . darbepoetin (ARANESP) injection - NON-DIALYSIS  200 mcg Subcutaneous Q Sun-1800  . divalproex  500 mg Oral Q12H  . docusate sodium  100 mg Oral Daily  . escitalopram  10 mg Oral Daily  . feeding supplement (ENSURE ENLIVE)  237 mL Oral BID BM  . feeding supplement (NEPRO CARB STEADY)  237 mL Oral BID BM  . ferric citrate  420 mg Oral TID WC  . gabapentin  100 mg Oral TID  . heparin  5,000 Units Subcutaneous Q8H  . hydrALAZINE  100 mg Oral Q8H  . insulin aspart  0-9 Units Subcutaneous TID WC  . isosorbide dinitrate  5 mg Oral BID  . labetalol  300 mg Oral BID  . multivitamin  1 tablet Oral QHS  . pantoprazole  40 mg Oral Daily  . pravastatin  20 mg Oral  Daily  . saccharomyces boulardii  250 mg Oral BID     LOS: 5 days   Hayden [name] C 11/25/2016,10:49 AM

## 2016-11-25 NOTE — Progress Notes (Signed)
Occupational Therapy Session Note  Patient Details  Name: Hayden [name] MRN: [id number] Date of Birth: [birth year]  Today's Date: 11/25/2016 OT Individual Time: 7616-0737 OT Individual Time Calculation (min): 62 min    Short Term Goals: Week 1:  OT Short Term Goal 1 (Week 1): STG=LTG 2/2 estimated short LOS  Skilled Therapeutic Interventions/Progress Updates:    OT treatment session focused on pt/family training, transfers, standing balance, and modified bathing/dressing. Educated pt's spouse on wc positioning for safe toilet transfers. Spouse provided set-up A and supervision for stand-pivot. Pt able to maintain standing balance using grab bar w/ L UE and use R UE to pull down pants. Pt completed peri-care, then transferred back to wc in similar fashion as above.  Pt declined to shower 2/2 9/10 pain in R foot-agreeable to sponge bath at sink. Educated pt on adapted strategies for LB dressing, and pt able to thread pant legs and pull up pants sit<>stand with increased time and overall supervision. Frequent rest breaks required for pain control. Sit<>stand at sink with close supervision + hip support at sink to brush teeth- 1 near LOB, but pt able to self-correct w/o hands on assist. Pt returned to wc and left sitting up for lunch.  Therapy Documentation Precautions:  Precautions Precautions: Fall Restrictions Weight Bearing Restrictions: No Pain: Pain Assessment Pain Assessment: 0-10 Pain Score: 9  Pain Type: Surgical pain Pain Location: Foot Pain Orientation: Right Pain Descriptors / Indicators: Aching Pain Frequency: Constant Pain Onset: On-going Patients Stated Pain Goal: 2 Pain Intervention(s): RN made aware;Repositioned ADL: ADL ADL Comments: Please see functional navigator  See Function Navigator for Current Functional Status.   Therapy/Group: Individual Therapy  Valma Cava 11/25/2016, 12:31 PM

## 2016-11-25 NOTE — Progress Notes (Signed)
Physical Therapy Session Note  Patient Details  Name: Hayden [name] MRN: [id number] Date of Birth: 08/27/1970  Today's Date: 11/25/2016 PT Individual Time: 0900-1015 PT Individual Time Calculation (min): 75 min   Short Term Goals: Week 1:   =LTGs  Skilled Therapeutic Interventions/Progress Updates:    does not rate pain but grimacing and moaning with movement throughout session.  Pt reports being premedicated and rest breaks provided as needed for pain.  Session focus on R knee extension stretch, RLE strengthening, transfers, gait, and stair negotiation.    PT instructed pt's wife in w/c set up for supervision level transfers and she demonstrates understanding throughout session.  PT instructed pt in R knee stretch with overpressure at R distal femur x5 minutes with rest breaks provided as needed.  Pt completes 3x10 RLE hip abd/add to midline, SLR, and LAQ with 3 second hold focus on knee extension.  Gait x15' with RW and close supervision.  Stair negotiation x4 steps with 2 rails and min guard, increased time, and verbal cues for RLE positioning to bear weight through heel.    Pt returned to room at end of session, wife checked off for transfers and pt made mod I for w/c<>bed transfers only.  Call bell in reach and needs met.   Therapy Documentation Precautions:  Precautions Precautions: Fall Restrictions Weight Bearing Restrictions: No   See Function Navigator for Current Functional Status.   Therapy/Group: Individual Therapy  Earnest Conroy Penven-Crew 11/25/2016, 10:19 AM

## 2016-11-25 NOTE — Progress Notes (Signed)
Patient upset about CBG checks. He has not been diabetic since weight loss and last A1c WNL. Will discontinue checks as BS have all been in normal range

## 2016-11-25 NOTE — Progress Notes (Signed)
[facility] PHYSICAL MEDICINE & REHABILITATION     PROGRESS NOTE    Subjective/Complaints: Pt seen laying in bed this AM.  He complain of pain in his right leg, later informed by wife and nursing of the same.   ROS: +Right foot pain. Denies CP, SOB, N/V/D.   Objective: Vital Signs: Blood pressure (!) 175/77, pulse 83, temperature 98.1 F (36.7 C), temperature source Oral, resp. rate 18, weight 83.7 kg (184 lb 9.2 oz), SpO2 98 %. No results found. No results for input(s): WBC, HGB, HCT, PLT in the last 72 hours.  Recent Labs  11/23/16 0424  NA 139  K 3.8  CL 100*  GLUCOSE 112*  BUN 57*  CREATININE 9.45*  CALCIUM 9.2   CBG (last 3)   Recent Labs  11/23/16 1633 11/23/16 2031 11/24/16 0633  GLUCAP 100* 90 99    Wt Readings from Last 3 Encounters:  11/25/16 83.7 kg (184 lb 9.2 oz)  11/19/16 84.8 kg (187 lb)  07/12/16 99.8 kg (220 lb)    Physical Exam:  Constitutional: He appears well-developed and well-nourished.  HENT: Normocephalic and atraumatic.  Eyes: EOMI. No discharge.  Cardiovascular: Irregular with systolic murmur. No JVD. Respiratory: Normal effort, CTA bilaterally.  GI: Soft. Bowel sounds are normal.  Musculoskeletal: He exhibits edema and tenderness.  Neurological: He is alert.  Motor: B/l UE, LLE: 5/5 proximal to distal RLE: HF 4/5, KE 4/5, wiggles toes Sensation intact to light touch  Skin:  Dressing to RLE.  Psychiatric: pleasant, a little impulsive.   Assessment/Plan: 1. Functional and cognitive deficits secondary to debility and encephalopathy which require 3+ hours per day of interdisciplinary therapy in a comprehensive inpatient rehab setting. Physiatrist is providing close team supervision and 24 hour management of active medical problems listed below. Physiatrist and rehab team continue to assess barriers to discharge/monitor patient progress toward functional and medical goals.  Function:  Bathing Bathing position   Position:  Shower  Bathing parts Body parts bathed by patient: Right arm, Left arm, Chest, Abdomen, Front perineal area, Buttocks, Left upper leg Body parts bathed by helper: Left lower leg  Bathing assist Assist Level: Touching or steadying assistance(Pt > 75%)      Upper Body Dressing/Undressing Upper body dressing   What is the patient wearing?: Pull over shirt/dress     Pull over shirt/dress - Perfomed by patient: Thread/unthread right sleeve, Thread/unthread left sleeve, Put head through opening, Pull shirt over trunk          Upper body assist Assist Level: Set up, Supervision or verbal cues   Set up : To obtain clothing/put away  Lower Body Dressing/Undressing Lower body dressing   What is the patient wearing?: Pants, Socks     Pants- Performed by patient: Thread/unthread left pants leg Pants- Performed by helper: Thread/unthread right pants leg, Pull pants up/down Non-skid slipper socks- Performed by patient: Don/doff left sock Non-skid slipper socks- Performed by helper: Don/doff left sock Socks - Performed by patient: Don/doff left sock, Don/doff right sock                Lower body assist Assist for lower body dressing: Touching or steadying assistance (Pt > 75%)      Toileting Toileting Toileting activity did not occur: Safety/medical concerns Toileting steps completed by patient: Performs perineal hygiene Toileting steps completed by helper: Adjust clothing prior to toileting, Adjust clothing after toileting Toileting Assistive Devices: Grab bar or rail  Toileting assist Assist level: Touching or steadying assistance (  Pt.75%)   Transfers Chair/bed transfer   Chair/bed transfer method: Squat pivot Chair/bed transfer assist level: Supervision or verbal cues Chair/bed transfer assistive device: Armrests, Bedrails     Locomotion Ambulation     Max distance: 4 ft Assist level: Touching or steadying assistance (Pt > 75%)   Wheelchair   Type: Manual Max  wheelchair distance: 150 Assist Level: Supervision or verbal cues  Cognition Comprehension Comprehension assist level: Follows complex conversation/direction with extra time/assistive device  Expression Expression assist level: Expresses complex ideas: With extra time/assistive device  Social Interaction Social Interaction assist level: Interacts appropriately with others with medication or extra time (anti-anxiety, antidepressant).  Problem Solving Problem solving assist level: Solves basic problems with no assist  Memory Memory assist level: Recognizes or recalls 75 - 89% of the time/requires cueing 10 - 24% of the time   Medical Problem List and Plan: 1.  Debilitation secondary to right first digit amputation/acute encephalopathy/multi-medical  Cont CIR 2.  DVT Prophylaxis/Anticoagulation: Subcutaneous heparin. Monitor for any bleeding episodes 3. Pain Management: Hydrocodone as needed 4. Mood: Lexapro 10 mg daily, Depakote 500 mg every 12 hours, Ativan as needed 5. Neuropsych: This patient is not capable of making decisions on his own behalf. 6. Skin/Wound Care: compressive dressing RLE  -VVS assist appreciated, DSD until reassessment on Tues then may start Santyl per Dr Bridgett Larsson 7. Fluids/Electrolytes/Nutrition: Routine I&O with follow-up chemistries  -mild hypokalemia 8. ESRD. Continue dialysis as per renal services 9. Hypertension. Clonidine patch 0.2 mg weekly, Isordil 5 mg twice a day, Norvasc 10 mg daily, labetalol 300 mg twice a day  Hydralazine 75, increased to 100 on 5/28.  - Vitals:   11/24/16 1820 11/25/16 0536  BP: (!) 169/72 (!) 175/77  Pulse: 81 83  Resp: 18 18  Temp: 97.8 F (36.6 C) 98.1 F (36.7 C)   10. Diabetes mellitus. Latest hemoglobin A1c 5.4. Check blood sugars before meals and at bedtime.SSI  Controlled 5/28 11. Chronic congestive heart failure. Monitor for any signs of fluid overload 12. Acute on chronic anemia.   Hb 9.2 on 5/25  Cont to monitor 13.  Tobacco abuse. Counseling 14. Leukocytosis:  WBCs 16.3 on 5/25  -wound follow up as above, appreciate vascular assist,   -afebrile    LOS (Days) 5 A FACE TO FACE EVALUATION WAS PERFORMED  Ankit Lorie Phenix, MD 11/25/2016 9:47 AM

## 2016-11-26 ENCOUNTER — Inpatient Hospital Stay (HOSPITAL_COMMUNITY): Payer: 59 | Admitting: Physical Therapy

## 2016-11-26 ENCOUNTER — Inpatient Hospital Stay (HOSPITAL_COMMUNITY): Payer: 59 | Admitting: Occupational Therapy

## 2016-11-26 ENCOUNTER — Inpatient Hospital Stay (HOSPITAL_COMMUNITY): Payer: Medicare Other | Admitting: Occupational Therapy

## 2016-11-26 DIAGNOSIS — D62 Acute posthemorrhagic anemia: Secondary | ICD-10-CM

## 2016-11-26 DIAGNOSIS — R0989 Other specified symptoms and signs involving the circulatory and respiratory systems: Secondary | ICD-10-CM

## 2016-11-26 DIAGNOSIS — I169 Hypertensive crisis, unspecified: Secondary | ICD-10-CM

## 2016-11-26 MED ORDER — DELFLEX-LC/2.5% DEXTROSE 394 MOSM/L IP SOLN: Freq: Once | INTRAPERITONEAL | Status: DC

## 2016-11-26 MED ORDER — COLLAGENASE 250 UNIT/GM EX OINT
TOPICAL_OINTMENT | Freq: Every day | CUTANEOUS | Status: DC
  Administered 2016-11-27 – 2016-11-28 (×2): via TOPICAL
  Filled 2016-11-26: qty 30

## 2016-11-26 MED ORDER — CALCITRIOL 0.5 MCG PO CAPS
0.5000 ug | ORAL_CAPSULE | Freq: Every day | ORAL | Status: DC
  Administered 2016-11-26 – 2016-11-28 (×3): 0.5 ug via ORAL
  Filled 2016-11-26 (×3): qty 1

## 2016-11-26 MED ORDER — HEPARIN 1000 UNIT/ML FOR PERITONEAL DIALYSIS
INTRAPERITONEAL | Status: DC | PRN
  Filled 2016-11-26: qty 5000

## 2016-11-26 MED ORDER — COLLAGENASE 250 UNIT/GM EX OINT
TOPICAL_OINTMENT | Freq: Every day | CUTANEOUS | Status: DC
  Filled 2016-11-26: qty 30

## 2016-11-26 MED ORDER — HEPARIN 1000 UNIT/ML FOR PERITONEAL DIALYSIS: 500.0000 [IU] | INTRAMUSCULAR | Status: DC | PRN

## 2016-11-26 MED ORDER — DELFLEX-LC/2.5% DEXTROSE 394 MOSM/L IP SOLN
INTRAPERITONEAL | Status: DC
  Administered 2016-11-27: 5000 mL via INTRAPERITONEAL

## 2016-11-26 MED ORDER — GENTAMICIN SULFATE 0.1 % EX CREA
1.0000 "application " | TOPICAL_CREAM | Freq: Every day | CUTANEOUS | Status: DC
  Administered 2016-11-27 – 2016-11-28 (×3): 1 via TOPICAL
  Filled 2016-11-26: qty 15

## 2016-11-26 NOTE — Plan of Care (Signed)
Problem: RH PAIN MANAGEMENT Goal: RH STG PAIN MANAGED AT OR BELOW PT'S PAIN GOAL Outcome: Not Progressing Rated pain at 10 at 0011 on 5/29

## 2016-11-26 NOTE — Progress Notes (Addendum)
[facility] PHYSICAL MEDICINE & REHABILITATION     PROGRESS NOTE    Subjective/Complaints: Pt seen laying in bed this AM.  He slept well overnight.  He notes drainage from his leg and pain in his RLE.   ROS: +Right foot pain. Denies CP, SOB, N/V/D.   Objective: Vital Signs: Blood pressure (!) 184/80, pulse 88, temperature 98.3 F (36.8 C), temperature source Oral, resp. rate 18, weight 83 kg (183 lb), SpO2 95 %. No results found.  Recent Labs  11/25/16 1048  WBC 16.6*  HGB 8.4*  HCT 28.0*  PLT 547*    Recent Labs  11/25/16 1048  NA 136  K 4.4  CL 100*  GLUCOSE 113*  BUN 63*  CREATININE 8.66*  CALCIUM 8.3*   CBG (last 3)   Recent Labs  11/23/16 2031 11/24/16 0633 11/25/16 1640  GLUCAP 90 99 126*    Wt Readings from Last 3 Encounters:  11/26/16 83 kg (183 lb)  11/19/16 84.8 kg (187 lb)  07/12/16 99.8 kg (220 lb)    Physical Exam:  Constitutional: He appears well-developed and well-nourished.  HENT: Normocephalic and atraumatic.  Eyes: EOMI. No discharge.  Cardiovascular: Irregular with systolic murmur. No JVD. Respiratory: Normal effort, CTA bilaterally.  GI: Soft. Bowel sounds are normal.  Musculoskeletal: He exhibits edema and tenderness.  Neurological: He is alert.  Motor: B/l UE, LLE: 5/5 proximal to distal RLE: HF 4/5, KE 4/5, wiggles toes (improving) Sensation intact to light touch  Skin:  Dressing to RLE with proximal sanguinous drainage Psychiatric: pleasant, a little impulsive.   Assessment/Plan: 1. Functional and cognitive deficits secondary to debility and encephalopathy which require 3+ hours per day of interdisciplinary therapy in a comprehensive inpatient rehab setting. Physiatrist is providing close team supervision and 24 hour management of active medical problems listed below. Physiatrist and rehab team continue to assess barriers to discharge/monitor patient progress toward functional and medical  goals.  Function:  Bathing Bathing position   Position: Wheelchair/chair at sink  Bathing parts Body parts bathed by patient: Right arm, Left arm, Chest, Left upper leg, Left lower leg Body parts bathed by helper: Left lower leg  Bathing assist Assist Level: Supervision or verbal cues      Upper Body Dressing/Undressing Upper body dressing   What is the patient wearing?: Pull over shirt/dress     Pull over shirt/dress - Perfomed by patient: Thread/unthread right sleeve, Thread/unthread left sleeve, Put head through opening, Pull shirt over trunk          Upper body assist Assist Level: Set up   Set up : To obtain clothing/put away  Lower Body Dressing/Undressing Lower body dressing   What is the patient wearing?: Pants, Socks     Pants- Performed by patient: Thread/unthread right pants leg, Thread/unthread left pants leg, Pull pants up/down Pants- Performed by helper: Thread/unthread right pants leg, Pull pants up/down Non-skid slipper socks- Performed by patient: Don/doff left sock Non-skid slipper socks- Performed by helper: Don/doff left sock Socks - Performed by patient: Don/doff left sock, Don/doff right sock                Lower body assist Assist for lower body dressing: Supervision or verbal cues      Toileting Toileting Toileting activity did not occur: Safety/medical concerns Toileting steps completed by patient: Performs perineal hygiene Toileting steps completed by helper: Adjust clothing prior to toileting, Adjust clothing after toileting Toileting Assistive Devices: Grab bar or rail  Toileting assist Assist level:  Touching or steadying assistance (Pt.75%)   Transfers Chair/bed transfer   Chair/bed transfer method: Squat pivot Chair/bed transfer assist level: Supervision or verbal cues Chair/bed transfer assistive device: Armrests     Locomotion Ambulation     Max distance: 10 ft  Assist level: Supervision or verbal cues   Wheelchair    Type: Manual Max wheelchair distance: 100 ft Assist Level: Supervision or verbal cues  Cognition Comprehension Comprehension assist level: Follows complex conversation/direction with extra time/assistive device  Expression Expression assist level: Expresses complex ideas: With extra time/assistive device  Social Interaction Social Interaction assist level: Interacts appropriately with others with medication or extra time (anti-anxiety, antidepressant).  Problem Solving Problem solving assist level: Solves basic problems with no assist  Memory Memory assist level: Recognizes or recalls 75 - 89% of the time/requires cueing 10 - 24% of the time   Medical Problem List and Plan: 1.  Debilitation secondary to right first digit amputation/acute encephalopathy/multi-medical  Cont CIR 2.  DVT Prophylaxis/Anticoagulation: Subcutaneous heparin. Monitor for any bleeding episodes 3. Pain Management: Hydrocodone as needed 4. Mood: Lexapro 10 mg daily, Depakote 500 mg every 12 hours, Ativan as needed 5. Neuropsych: This patient is not capable of making decisions on his own behalf. 6. Skin/Wound Care: compressive dressing RLE  -VVS assist appreciated, DSD until reassessment on Tues then may start Santyl per Dr Bridgett Larsson 7. Fluids/Electrolytes/Nutrition: Routine I&O with follow-up chemistries  -mild hypokalemia 8. ESRD. Continue dialysis as per renal services 9. Hypertension. Clonidine patch 0.2 mg weekly, Isordil 5 mg twice a day, Norvasc 10 mg daily, labetalol 300 mg twice a day  Hydralazine 75, increased to 100 on 5/28.  Vitals:   11/25/16 1800 11/26/16 0500  BP: 128/78 (!) 184/80  Pulse: 88 88  Resp: 17 18  Temp: 98.5 F (36.9 C) 98.3 F (36.8 C)   Extremely labile with hypertensive crisis, will adjust meds if persistent elevation 10. Diabetes mellitus. Latest hemoglobin A1c 5.4. Check blood sugars before meals and at bedtime.  Pt upset and refusing CBGs, will d/c  Controlled 5/29 11. Chronic  congestive heart failure. Monitor for any signs of fluid overload 12. Acute on chronic anemia.   Hb 8.4 on 5/28  Cont to monitor 13. Tobacco abuse. Counseling 14. Leukocytosis:  WBCs 16.6 on 5/28  -wound follow up as above, appreciate vascular assist,   -afebrile    LOS (Days) 6 A FACE TO FACE EVALUATION WAS PERFORMED  Ankit Lorie Phenix, MD 11/26/2016 9:15 AM

## 2016-11-26 NOTE — Progress Notes (Signed)
Background: 46 y.o.w/ESRD on CCPD,tobacco abuse, a/cCHF, HTN, h/o CVA. 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 Hospital course complicated by acute encephalopathy/mania resolved. Rehab for deconditioning.   Assessment/Plan:  1. Ischemic R foot / sp R fem-pop bypass w great toe amp on 5/8 -. 2. AMS/delerium - resolved and back at baseline 3. ESRD - CCPD. Using all 2.5% dialysate. Continue same. 4. Anemia - (Aranesp 200 QSunday) 5. MBD- restart calcitriol 6. HTN, suboptimal. Continue to use all 2.5% dialysate. BP meds amlodipine/clonidine/hydralazine/labetolol  7. Dispo - anticipated d/c date 11/29/16  Subjective: Interval History: Feels better  Objective: Vital signs in last 24 hours: Temp:  [97.5 F (36.4 C)-98.5 F (36.9 C)] 98.3 F (36.8 C) (05/29 0500) Pulse Rate:  [83-88] 88 (05/29 0500) Resp:  [17-18] 18 (05/29 0500) BP: (128-184)/(60-80) 184/80 (05/29 0500) SpO2:  [94 %-95 %] 95 % (05/29 0500) Weight:  [83 kg (183 lb)] 83 kg (183 lb) (05/29 0500) Weight change: -0.692 kg (-1 lb 8.4 oz)  Intake/Output from previous day: 05/28 0701 - 05/29 0700 In: 15580 [P.O.:600] Out: 17229  Intake/Output this shift: Total I/O In: 15100 [P.O.:120; HCWCB:76283] Out: [id number] [Other:16922]  General appearance: alert and cooperative Chest wall: no tenderness GI: soft, non-tender; bowel sounds normal; no masses,  no organomegaly and cath Extremities: edema 1+  Lab Results:  Recent Labs  11/25/16 1048  WBC 16.6*  HGB 8.4*  HCT 28.0*  PLT 547*   BMET:  Recent Labs  11/25/16 1048  NA 136  K 4.4  CL 100*  CO2 24  GLUCOSE 113*  BUN 63*  CREATININE 8.66*  CALCIUM 8.3*   No results  for input(s): PTH in the last 72 hours. Iron Studies: No results for input(s): IRON, TIBC, TRANSFERRIN, FERRITIN in the last 72 hours. Studies/Results: No results found.  Scheduled: . amLODipine  10 mg Oral Daily  . aspirin  81 mg Oral Daily  . cinacalcet  60 mg Oral Q supper  . cloNIDine  0.2 mg Transdermal Q Thu  . collagenase   Topical Daily  . darbepoetin (ARANESP) injection - NON-DIALYSIS  200 mcg Subcutaneous Q Sun-1800  . divalproex  500 mg Oral Q12H  . docusate sodium  100 mg Oral Daily  . escitalopram  10 mg Oral Daily  . feeding supplement (ENSURE ENLIVE)  237 mL Oral BID BM  . feeding supplement (NEPRO CARB STEADY)  237 mL Oral BID BM  . ferric citrate  420 mg Oral TID WC  . gabapentin  100 mg Oral TID  . heparin  5,000 Units Subcutaneous Q8H  . hydrALAZINE  100 mg Oral Q8H  . isosorbide dinitrate  5 mg Oral BID  . labetalol  300 mg Oral BID  . multivitamin  1 tablet Oral QHS  . pantoprazole  40 mg Oral Daily  . pravastatin  20 mg Oral Daily  . saccharomyces boulardii  250 mg Oral BID      LOS: 6 days   Arnette Driggs C 11/26/2016,1:29 PM

## 2016-11-26 NOTE — Progress Notes (Addendum)
   Daily Progress Note   Assessment/Planning:   POD #21 s/p R CFA to BK pop BPG w/ NR ips GSV, R 1st ray amp   R pop incision has some ischemic skin which would likely need sharp debridement and wound care: pt not interested at this point  R calf GSV harvest has some fatty necrosis: would manage all the calf wound with Santyl ointment and Wet-to-dry dressing BID  Will remove sutures in R 1st ray amp today.  If wound falls apart, pt may need more proximal debridement with Dr. Sharol Given.  I have discussed with the patient that if he doesn't continue straightening his R knee, he will eliminate any benefit to doing the bypass as his ambulation is going to be limited by his knee contracture.  Will continue to periodically check on the patient   Subjective    No complaints   Objective   Vitals:   11/25/16 0536 11/25/16 1548 11/25/16 1800 11/26/16 0500  BP: (!) 175/77 136/60 128/78 (!) 184/80  Pulse: 83 83 88 88  Resp: 18 18 17 18   Temp: 98.1 F (36.7 C) 97.5 F (36.4 C) 98.5 F (36.9 C) 98.3 F (36.8 C)  TempSrc: Oral Oral Oral Oral  SpO2: 98% 94%  95%  Weight: 184 lb 9.2 oz (83.7 kg)   183 lb (83 kg)     Intake/Output Summary (Last 24 hours) at 11/26/16 1027 Last data filed at 11/26/16 0900  Gross per 24 hour  Intake            15580 ml  Output            16922 ml  Net            -1342 ml    PULM  CTAB  CV  RRR  GI  soft, NTND  VASC L pop exposure: dead skin at prior blister site, some necrotic fat evident at vein harvest site; R foot: unchanged on exam, somewhat ischemic appearing skin adjacent to ray amp, incision c/d/i with some dried blood on incision, some TTP to light touch at amp site  NEURO Sensation intact R foot with intact motor    Laboratory   CBC CBC Latest Ref Rng & Units 11/25/2016 11/22/2016 11/20/2016  WBC 4.0 - 10.5 K/uL 16.6(H) 16.3(H) 17.0(H)  Hemoglobin 13.0 - 17.0 g/dL 8.4(L) 9.2(L) 9.4(L)  Hematocrit 39.0 - 52.0 % 28.0(L) 30.4(L) 30.6(L)    Platelets 150 - 400 K/uL 547(H) 611(H) 666(H)    BMET    Component Value Date/Time   NA 136 11/25/2016 1048   K 4.4 11/25/2016 1048   CL 100 (L) 11/25/2016 1048   CO2 24 11/25/2016 1048   GLUCOSE 113 (H) 11/25/2016 1048   BUN 63 (H) 11/25/2016 1048   CREATININE 8.66 (H) 11/25/2016 1048   CALCIUM 8.3 (L) 11/25/2016 1048   GFRNONAA 7 (L) 11/25/2016 1048   GFRAA 8 (L) 11/25/2016 1048     Adele Barthel, MD, FACS [facility] Office: [phone] Pager: [phone]  11/26/2016, 10:27 AM

## 2016-11-26 NOTE — Progress Notes (Signed)
Physical Therapy Session Note  Patient Details  Name: Hayden [name] MRN: [id number] Date of Birth: 1970-11-01  Today's Date: 11/26/2016 PT Individual Time: 1415-1525 PT Individual Time Calculation (min): 70 min   Short Term Goals: Week 1:   =LTGs  Skilled Therapeutic Interventions/Progress Updates:    c/o pain in RLE but does not rate.  Occasionally grimacing, moaning, and grabbing R foot. Pt premedicated per pt and RN report.  Session on activity tolerance, ROM, and balance with all functional mobility.    Pt transitions to EOB mod I, dons shoes with set up assist, and transfers to w/c mod I.  Requesting to toilet, and does so with set up assist for transfer, mod I for toileting.    W/C propulsion throughout unit mod I, pt able to manage w/c parts mod I.  Transfers sit<>stand and squat/pivot mod I throughout session.  Nustep x10 minutes at level 3 for RLE ROM and activity tolerance.  Gait training x31' with RW and close supervision, min verbal cues for walker positioning.    Pt requires several extended seated rest breaks throughout session 2/2 pain and fatigue. Returned to room at end of session with call bell in reach and needs met.     Therapy Documentation Precautions:  Precautions Precautions: Fall Restrictions Weight Bearing Restrictions: No   See Function Navigator for Current Functional Status.   Therapy/Group: Individual Therapy  Hayden [name] 11/26/2016, 2:41 PM

## 2016-11-26 NOTE — Progress Notes (Signed)
Occupational Therapy Session Note  Patient Details  Name: Hayden [name] MRN: [id number] Date of Birth: 05/03/1971  Today's Date: 11/26/2016 OT Individual Time: 1000-1115 OT Individual Time Calculation (min): 75 min   Short Term Goals: Week 1:  OT Short Term Goal 1 (Week 1): STG=LTG 2/2 estimated short LOS  Skilled Therapeutic Interventions/Progress Updates:    Pt greeted asleep in bed but easily awoken and agreeable to OT. Pt more lethargic and talkitive today 2/2 pain meds for removal of stitches. OT treatment session focused on transfer training, dc planning, DME needs, and ADL modifications. Pt transferred to wc w/o assistance, propelled wc to the sink and completed 2 sit<>stands with supervision. Focus standing balance and endurance in preparation for ADL tasks. Discussed home set-up and modifications for ADLs in home environment. Physician then entered the room for stitches removal. Pt transferred back to bed mod I. Pt missed 20 mins of skilled OT. OT returned and addressed LB dressing techniques, and pt able to don R off-loading shoe, and L sock-shoe w. Set-up. Pt left seated in wc with R LE elevated on leg rests.   Therapy Documentation Precautions:  Precautions Precautions: Fall Restrictions Weight Bearing Restrictions: No General: General OT Amount of Missed Time: 20 Minutes- pt having stitches removed VPain: Pain Assessment Pain Assessment: 0-10 Pain Score: 10-Worst pain ever Pain Type: Acute pain Pain Location: Leg Pain Orientation: Right Pain Descriptors / Indicators: Aching Pain Onset: On-going Pain Intervention(s): Repositioned ADL: ADL ADL Comments: Please see functional navigator  See Function Navigator for Current Functional Status.   Therapy/Group: Individual Therapy  Valma Cava 11/26/2016, 1:03 PM

## 2016-11-26 NOTE — Progress Notes (Signed)
Occupational Therapy Session Note  Patient Details  Name: Hayden [name] MRN: [id number] Date of Birth: 02/15/1971  Today's Date: 11/26/2016 OT Individual Time: 1531-1620 OT Individual Time Calculation (min): 49 min    Skilled Therapeutic Interventions/Progress Updates: Patient had just completed PT and complain of discomfort and pain in his right leg.  RN came in to speak with patient regarding his discomfort.   Though in pain he concurred to complete endurance activities seated in order to increase upper body and arm strength for overall transfers, mobility and self care.  Wife present for session.   Patient left in his room with wife at the end of the session     Therapy Documentation Precautions:  Precautions Precautions: Fall Restrictions Weight Bearing Restrictions: No  Pain: RN spoke with patient and gave medication - not sure which Pain Assessment Pain Assessment: 0-10 Pain Score: 10-Worst pain ever Pain Type: Acute pain Pain Location: Leg Pain Orientation: Right Pain Descriptors / Indicators: Aching Pain Onset: On-going Pain Intervention(s): Repositioned   Therapy/Group: Individual Therapy  Alfredia Ferguson [facility] 11/26/2016, 5:00 PM

## 2016-11-27 ENCOUNTER — Inpatient Hospital Stay (INDEPENDENT_AMBULATORY_CARE_PROVIDER_SITE_OTHER): Payer: Medicare Other | Admitting: Family

## 2016-11-27 ENCOUNTER — Inpatient Hospital Stay (HOSPITAL_COMMUNITY): Payer: 59 | Admitting: Physical Therapy

## 2016-11-27 ENCOUNTER — Inpatient Hospital Stay (HOSPITAL_COMMUNITY): Payer: 59 | Admitting: Occupational Therapy

## 2016-11-27 DIAGNOSIS — M792 Neuralgia and neuritis, unspecified: Secondary | ICD-10-CM

## 2016-11-27 MED ORDER — NEPRO/CARBSTEADY PO LIQD: 237.0000 mL | Freq: Every day | ORAL | Status: DC

## 2016-11-27 MED ORDER — DELFLEX-LC/4.25% DEXTROSE 483 MOSM/L IP SOLN
INTRAPERITONEAL | Status: DC
  Administered 2016-11-27: 5000 mL via INTRAPERITONEAL

## 2016-11-27 NOTE — Discharge Summary (Signed)
Discharge summary job [phone]

## 2016-11-27 NOTE — Progress Notes (Signed)
Physical Therapy Session Note  Patient Details  Name: Hayden [name] MRN: [id number] Date of Birth: [birth year]  Today's Date: 11/27/2016 PT Individual Time: 1620-1700 PT Individual Time Calculation (min): 40 min   Short Term Goals: Week 1:    STG=LTG  Skilled Therapeutic Interventions/Progress Updates:    Pt received sitting in WC and agreeable to PT WC mobility instructed by PT in hall of hospital x 267f, 1050f and 15021fNo cues or assist from PT to navigate doorways or thresholds. Additional WC mobility through simulated home environment without assist from PT x [id number] training x [id number] close supervision assist and min-mod cues for proper AD use and swing to gait pattern.  Sit<>stand x 2 without assist from PT but noticeably increased time and effort. Squat pivot transfer x2 without additional assist from PT.   Patient returned too room and [facility] and all needs met.     Therapy Documentation Precautions:  Precautions Precautions: Fall Restrictions Weight Bearing Restrictions: No Pain: Pain Assessment Pain Assessment: 0-10 Pain Score: 4  Pain Type: Surgical pain Pain Location: Foot Pain Orientation: Right Pain Descriptors / Indicators: Aching;Throbbing Pain Frequency: Intermittent Pain Onset: On-going Pain Intervention(s): Repositioned See Function Navigator for Current Functional Status.   Therapy/Group: Individual Therapy  AustiLorie Phenix/2018, 5:20 PM

## 2016-11-27 NOTE — Progress Notes (Signed)
Assessment/Plan:  1. Ischemic R foot / sp R fem-pop bypass w great toe amp on 5/8 -. 2. AMS/delerium - resolved and back at baseline 3. ESRD - CCPD. Using all 2.5% dialysate. Continue same. 4. Anemia - (Aranesp 200 QSunday) 5. MBD- restart calcitriol 6. HTN, suboptimal. Use 10 liters 2.5% & 5 liters 4.25% dialysate. BP meds amlodipine/clonidine/hydralazine/labetolol  7. Dispo - anticipated d/c date 11/29/16  Subjective: Interval History: Happy with care! Dialysis going well.  Objective: Vital signs in last 24 hours: Temp:  [98.2 F (36.8 C)-98.7 F (37.1 C)] 98.7 F (37.1 C) (05/30 0415) Pulse Rate:  [79-83] 83 (05/30 0415) Resp:  [17-18] 17 (05/30 0415) BP: (135-158)/(62-69) 158/69 (05/30 0415) SpO2:  [96 %-98 %] 96 % (05/30 0415) Weight:  [84.9 kg (187 lb 2.7 oz)-85.7 kg (188 lb 15 oz)] 84.9 kg (187 lb 2.7 oz) (05/30 0415) Weight change: 2.692 kg (5 lb 15 oz)  Intake/Output from previous day: 05/29 0701 - 05/30 0700 In: 15560 [P.O.:580] Out: 16384  Intake/Output this shift: Total I/O In: 24200 [Other:24200] Out: 17001 [Other:17001]  General appearance: alert and cooperative Chest wall: no tenderness GI: soft, non-tender; bowel sounds normal; no masses,  no organomegaly and tube Extremities: RLE post op and bandage R foot  Lab Results:  Recent Labs  11/25/16 1048  WBC 16.6*  HGB 8.4*  HCT 28.0*  PLT 547*   BMET:  Recent Labs  11/25/16 1048  NA 136  K 4.4  CL 100*  CO2 24  GLUCOSE 113*  BUN 63*  CREATININE 8.66*  CALCIUM 8.3*   No results for input(s): PTH in the last 72 hours. Iron Studies: No results for input(s): IRON, TIBC, TRANSFERRIN, FERRITIN in the last 72 hours. Studies/Results: No results found.  Scheduled: . amLODipine  10 mg Oral Daily  . aspirin  81 mg Oral Daily  . calcitRIOL  0.5 mcg Oral Daily  . cinacalcet  60 mg Oral Q supper  . cloNIDine  0.2 mg Transdermal Q Thu  . collagenase   Topical Daily  . darbepoetin (ARANESP)  injection - NON-DIALYSIS  200 mcg Subcutaneous Q Sun-1800  . divalproex  500 mg Oral Q12H  . docusate sodium  100 mg Oral Daily  . escitalopram  10 mg Oral Daily  . feeding supplement (ENSURE ENLIVE)  237 mL Oral BID BM  . feeding supplement (NEPRO CARB STEADY)  237 mL Oral BID BM  . ferric citrate  420 mg Oral TID WC  . gabapentin  100 mg Oral TID  . gentamicin cream  1 application Topical Daily  . heparin  5,000 Units Subcutaneous Q8H  . hydrALAZINE  100 mg Oral Q8H  . isosorbide dinitrate  5 mg Oral BID  . labetalol  300 mg Oral BID  . multivitamin  1 tablet Oral QHS  . pantoprazole  40 mg Oral Daily  . pravastatin  20 mg Oral Daily  . saccharomyces boulardii  250 mg Oral BID     LOS: 7 days   Aaro Meyers C 11/27/2016,11:14 AM

## 2016-11-27 NOTE — Discharge Instructions (Signed)
Inpatient Rehab Discharge Instructions  Zakaree Mcclenahan Kot[place] Discharge date and time: No discharge date for patient encounter.   Activities/Precautions/ Functional Status: Activity: activity as tolerated Diet: renal diet Wound Care: SANTYL right calf ischemic skin open calf wound twice daily. Apply wet to dry dressings slightly wet 4 x 4 with normal saline on top of SANTYL twice daily dry ABD on top of wet to dry Functional status:  ___ No restrictions     ___ Walk up steps independently ___ 24/7 supervision/assistance   ___ Walk up steps with assistance ___ Intermittent supervision/assistance  ___ Bathe/dress independently ___ Walk with walker     _x__ Bathe/dress with assistance ___ Walk Independently    ___ Shower independently ___ Walk with assistance    ___ Shower with assistance ___ No alcohol     ___ Return to work/school ________  Special Instructions: No smoking  Continue dialysis as per renal services  SANTYL right calf ischemic scan and open calf wound twice a day. Apply wet to dry dressings slightly wet 4 x 4 with normal saline on top of SANTYL twice daily dry ABD on top of wet to dry dressing   COMMUNITY REFERRALS UPON DISCHARGE:    Home Health:   PT & RN    [facility] Phone: [phone]   Date of last service:11/28/2016  Medical Equipment/Items [place]   [phone]     My questions have been answered and I understand these instructions. I will adhere to these goals and the provided educational materials after my discharge from the hospital.  Patient/Caregiver Signature _______________________________ Date __________  Clinician Signature _______________________________________ Date __________  Please bring this form and your medication list with you to all your follow-up doctor's appointments.

## 2016-11-27 NOTE — Progress Notes (Signed)
Nutrition Follow-up  DOCUMENTATION CODES:   Not applicable  INTERVENTION:  Continue Ensure Enlive po BID, each supplement provides 350 kcal and 20 grams of protein.  Provide Nepro Shake po once daily, each supplement provides 425 kcal and 19 grams protein.  Recommend continuation of nutritional supplements post discharge especially if po intake is poor.   Encourage adequate PO intake.   NUTRITION DIAGNOSIS:   Increased nutrient needs related to chronic illness as evidenced by estimated needs; ongoing  GOAL:   Patient will meet greater than or equal to 90% of their needs; met  MONITOR:   PO intake, Supplement acceptance, Labs, Weight trends, Skin, I & O's  REASON FOR ASSESSMENT:   Malnutrition Screening Tool    ASSESSMENT:   46 y.o. 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 Presented 10/23/2016 with right foot redness and pain. 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 The anterior tibial artery appears occluded. Underwent angioplasty right superficial femoral artery per vascular surgery 11/05/2016 per Dr. Bridgett Larsson after receiving cardiac clearance as well as right first digit amputation and dorsal ulcer debridement.  Meal completion has been 50-100%. Pt currently has Ensure and Nepro Shake with varied intake. Intake has been adequate. Plans for discharge tomorrow. Encourage continuation of nutritional supplements post discharge especially if po intake becomes poor for adequate nutrition and wound healing.   Diet Order:  Diet renal/carb modified with fluid restriction Diet-HS Snack? Nothing; Room service appropriate? Yes; Fluid consistency: Thin  Skin:  Wound (see comment) (DTI R foot, incision R foot)  Last BM:   5/29  Height:   Ht Readings from Last 1 Encounters:  10/25/16 5' 10"  (1.778 m)    Weight:   Wt Readings from Last 1 Encounters:  11/27/16 187 lb 2.7 oz (84.9 kg)    Ideal Body Weight:  75.4 kg  BMI:  Body mass index is 26.86 kg/m.  Estimated Nutritional Needs:   Kcal:  2200-2400  Protein:  110-120 grams  Fluid:  Per MD  EDUCATION NEEDS:   Education needs addressed  Corrin Parker, MS, RD, LDN Pager # [phone] After hours/ weekend pager # [phone]

## 2016-11-27 NOTE — Discharge Summary (Signed)
NAME:  Hayden [name], WIACEK NO.:  [id number]  MEDICAL RECORD NO.:  [id number]  LOCATION:                                 FACILITY:  PHYSICIAN:  Lauraine Rinne, P.A.  DATE OF BIRTH:  01-Jul-1971  DATE OF ADMISSION:  11/20/2016 DATE OF DISCHARGE:  11/28/2016                              DISCHARGE SUMMARY   DISCHARGE DIAGNOSES: 1. Debilitation secondary to right first digit amputation with acute     encephalopathy. 2. Subcutaneous heparin for deep vein thrombosis prophylaxis. 3. Pain management. 4. Depression. 5. End-stage renal disease. 6. Hypertension. 7. Diabetes mellitus. 8. Peripheral neuropathy. 9. Congestive heart failure. 10.Acute-on-chronic anemia. 11.Tobacco abuse. 12.Leukocytosis.  HISTORY OF PRESENT ILLNESS:  This is a 46 year old right-handed male, history of end-stage renal disease with peritoneal dialysis, tobacco abuse, acute-on-chronic congestive heart failure, hypertension.  The patient lives with spouse, independent with a cane prior to admission. Wife works multiple shifts.  Presented on October 23, 2016, with right foot redness, pain, initially injury happened when he caught his foot in the tract of the back seat of his car.  Initially, tried to keep the area clean and dry.  Was given a dose of vancomycin in the emergency room, but left AMA.  Findings of elevated white blood cell count at 14,600, lactic acid 0.73.  X-rays of the right foot showed no acute abnormalities.  Maintained on broad-spectrum antibiotics.  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 The anterior tibial artery appeared occluded.  Underwent angioplasty of right superficial femoral artery per Vascular Surgery, Dr. Bridgett Larsson after receiving cardiac clearance as well as right first digit amputation and dorsal ulcer debridement.  Neurology consulted for all altered  mental status.  EEG, nonspecific cerebral dysfunction and encephalopathy.  No seizure activity.  Maintained on Depakote.  The patient did require short-term intubation.  Dialysis ongoing as per Renal Services.  The patient with intermittent bouts of agitation, restlessness.  Cognition slowly improving.  Subcutaneous heparin for DVT prophylaxis.  Physical and occupational therapy ongoing.  The patient was admitted for a comprehensive rehab program.  PAST MEDICAL HISTORY:  See discharge diagnoses.  SOCIAL HISTORY:  Lives with spouse, independent with a cane prior to admission.  Functional status upon admission to rehab services was minimal guard for feet rolling walker.  Moderate assist sit to stand. Moderate assist activities of daily living.  PHYSICAL EXAMINATION:  VITAL SIGNS:  Blood pressure 150/91, pulse 99, temperature 98, respirations 18. GENERAL:  This was an alert male, well developed, anxious, distracted, pleasantly confused at times. HEENT:  EOMs intact. NECK:  Supple.  Nontender.  No JVD. CARDIAC:  Regular rate and rhythm.  No murmur. ABDOMEN:  Soft, nontender.  Good bowel sounds. LUNGS:  Clear to auscultation without wheeze. EXTREMITIES:  Dressing in place to right lower extremity.  REHABILITATION HOSPITAL COURSE:  The patient was admitted to inpatient rehab services with therapies initiated on a 3-hour daily basis, consisting of physical therapy, occupational therapy, and rehabilitation and nursing.  The following issues were addressed during the patient's rehabilitation stay.  Pertaining to Mr. Nordmeyer' right foot digit amputation revascularization,  surgical site healing slowly, dressing changes as advised.  He would follow up with Vascular Surgery.  In regard to his acute encephalopathy, cognition continued to improve as he was attending full therapies.  Subcutaneous heparin for DVT prophylaxis. No bleeding episodes.  Hydrocodone for pain.  In regard to mood  and agitation, he remained on Lexapro and Depakote, Ativan as needed. Overall, improved with restlessness.  Hemodialysis ongoing as per Renal Services.  Blood pressure is well controlled on clonidine, isordil, Norvasc, labetalol, hydralazine, monitoring for any orthostatic changes. Hemoglobin A1c 5.4.  Blood sugars well controlled.  He exhibited no signs of fluid overload.  Acute-on-chronic anemia 8.4 and monitored.  He did have a history of tobacco abuse, receiving full counseling in regard to cessation of nicotine products.  It was questionable if he would be compliant with these requests.  The patient received weekly collaborative interdisciplinary team conferences to discuss estimated length of stay, family teaching, any barriers to his discharge. Wheelchair propulsion, modified independent.  Transfers, sit to stand, squat pivot, modified independent, three sessions, requiring rest breaks as needed.  Ambulating 31 feet rolling walker, supervision with some verbal cues.  Could gather belongings for activities of daily living and homemaking, needing some assistance for lower body dressing and extended time to complete tasks.  Discussed at length with family on need for supervision for his safety.  He was discharged to home.  DISCHARGE MEDICATIONS:  Included: 1. Norvasc 10 mg p.o. daily. 2. Aspirin 81 mg p.o. daily. 3. Rocaltrol 0.5 mcg p.o. daily. 4. Sensipar 60 mg p.o. daily. 5. Clonidine patch 0.2 mg weekly. 6. Aranesp with dialysis. 7. Depakote 500 mg p.o. every 12 hours. 8. Colace 100 mg p.o. daily. 9. Lexapro 10 mg p.o. daily. 10.Auryxia 420 mg p.o. t.i.d. 11.Neurontin 100 mg p.o. t.i.d. 12.Hydralazine 100 mg p.o. every 8 hours. 13.Isordil 5 mg p.o. b.i.d. 14.Labetalol 300 mg p.o. b.i.d. 15.Multivitamin daily. 16.Protonix 40 mg p.o. daily. 17.Pravachol 20 mg p.o. daily. 18.Florastor 250 mg p.o. b.i.d. 19.Hydrocodone 1-2 tablets every 6 hours as needed pain. 20.Ativan 1  mg p.o. every 6 hours as needed agitation.  DIET:  His diet was a renal diabetic diet.  FOLLOWUP:  He would follow up with Dr. Alysia Penna at the outpatient rehab center as directed; Dr. Adele Barthel, call for appointment; Dr. Meridee Score, call for appointment; Dr. Corliss Parish, follow up in regard to ongoing dialysis.  Skin care as per Vascular Surgery with Santyl ointment, right calf ischemic skin and open calf wound twice daily.  Apply wet-to-dry dressing, slightly wet 4 x 4 with normal saline on top of Santyl.  Dry ABD pad on top of wet-to-dry dressing.     Lauraine Rinne, P.A.     DA/MEDQ  D:  11/27/2016  T:  11/27/2016  Job:  [id number]  cc:   Charlett Blake, M.D. [place], MD Newt Minion, MD Louis Meckel, M.D.

## 2016-11-27 NOTE — Progress Notes (Signed)
Physical Therapy Session Note  Patient Details  Name: Hayden [name] MRN: [id number] Date of Birth: 1970/12/31  Today's Date: 11/27/2016 PT Individual Time: 1400-1515 PT Individual Time Calculation (min): 75 min   Short Term Goals: Week 1:   =LTGs  Skilled Therapeutic Interventions/Progress Updates:    no c/o of pain at rest but 10/10 once starting to move.  Session focus on activity tolerance, transfers, and stair negotiation.    Pt takes more than a reasonable amount of time to come to EOB and don shoes.  Transfers squat/pivot throughout session mod I.  Car transfers mod I.  W/C propulsion with BUEs for activity tolerance and mobility mod I.  PT and pt problem solved through stair negotiation as pt with difficulty bearing weight through RLE to ascend, pt able to sit on bottom step, boost self up 5 steps, and stand from elevated top step with supervision.  Pt requires more than a reasonable time and frequent rest breaks throughout session due to pain.  End of session pt left with wife to go to cafeteria.  NT aware.   Therapy Documentation Precautions:  Precautions Precautions: Fall Restrictions Weight Bearing Restrictions: No   See Function Navigator for Current Functional Status.   Therapy/Group: Individual Therapy  Tajh Livsey E Penven-Crew 11/27/2016, 4:10 PM

## 2016-11-27 NOTE — Progress Notes (Signed)
Physical Therapy Discharge Summary  Patient Details  Name: KEYSTON ARDOLINO MRN: [id number] Date of Birth: 1970/07/07  Today's Date: 11/27/2016    Patient has met 7 of 7 long term goals due to improved activity tolerance, improved balance, improved postural control, increased strength, increased range of motion, improved attention and improved awareness.  Patient to discharge at overall mod I w/c level, supervision with RW level  .   Patient's care partner is independent to provide the necessary physical assistance at discharge.  Reasons goals not met: all goals met.   Recommendation:  Patient will benefit from ongoing skilled PT services in home health setting to continue to advance safe functional mobility, address ongoing impairments in balance, pain, ROM, and activity tolerance, and minimize fall risk.  Equipment: w/c with ELRs  Reasons for discharge: treatment goals met and discharge from hospital  Patient/family agrees with progress made and goals achieved: Yes  PT Discharge Precautions/Restrictions Precautions Precautions: Fall Restrictions Weight Bearing Restrictions: No Pain Pain Assessment Pain Assessment: 0-10 Pain Score: 10-Worst pain ever Pain Type: Surgical pain Pain Location: Foot Pain Orientation: Right Pain Descriptors / Indicators: Aching;Throbbing Pain Frequency: Intermittent Pain Onset: On-going Pain Intervention(s): Medication (See eMAR) Vision/Perception  Vision - History Baseline Vision: Wears glasses all the time Perception Perception: Within Functional Limits Praxis Praxis: Intact  Cognition Overall Cognitive Status: Within Functional Limits for tasks assessed Arousal/Alertness: Awake/alert Orientation Level: Oriented to person;Disoriented to time;Oriented to place;Oriented to situation (April 2018 for date) Attention: Selective Selective Attention: Appears intact Memory: Impaired Awareness: Appears intact Problem Solving: Appears  intact Behaviors: Poor frustration tolerance Safety/Judgment: Appears intact Sensation Sensation Light Touch: Impaired Detail Light Touch Impaired Details: Impaired RLE (numbness/tingling in RLE just below knee to foot) Proprioception: Appears Intact Coordination Gross Motor Movements are Fluid and Coordinated: Yes Fine Motor Movements are Fluid and Coordinated: Yes Motor  Motor Motor: Within Functional Limits  Mobility Bed Mobility Bed Mobility: Supine to Sit;Sit to Supine Supine to Sit: 6: Modified independent (Device/Increase time) Sit to Supine: 6: Modified independent (Device/Increase time) Transfers Transfers: Yes Stand Pivot Transfers: 6: Modified independent (Device/Increase time) Squat Pivot Transfers: 6: Modified independent (Device/Increase time) Locomotion  Ambulation Ambulation: Yes Ambulation/Gait Assistance: 5: Supervision Ambulation Distance (Feet): 50 Feet Assistive device: Rolling walker Ambulation/Gait Assistance Details: Verbal cues for technique;Verbal cues for precautions/safety;Verbal cues for gait pattern;Verbal cues for safe use of DME/AE Gait Gait: No Stairs / Additional Locomotion Stairs: Yes Stairs Assistance: 5: Supervision Stair Management Technique: Seated/boosting Number of Stairs: 4 Height of Stairs: 6 Wheelchair Mobility Wheelchair Mobility: Yes Wheelchair Assistance: 6: Modified independent (Device/Increase time) Environmental health practitioner: Both upper extremities Wheelchair Parts Management: Independent Distance: 150  Trunk/Postural Assessment  Cervical Assessment Cervical Assessment: Within Functional Limits Thoracic Assessment Thoracic Assessment: Within Functional Limits Lumbar Assessment Lumbar Assessment: Within Functional Limits Postural Control Postural Control: Within Functional Limits  Balance Balance Balance Assessed: Yes Dynamic Sitting Balance Dynamic Sitting - Balance Support: Feet supported;No upper extremity  supported Dynamic Sitting - Level of Assistance: 6: Modified independent (Device/Increase time) Static Standing Balance Static Standing - Balance Support: During functional activity Static Standing - Level of Assistance: 6: Modified independent (Device/Increase time) Dynamic Standing Balance Dynamic Standing - Balance Support: During functional activity Dynamic Standing - Level of Assistance: 6: Modified independent (Device/Increase time) Extremity Assessment  RUE Assessment RUE Assessment: Within Functional Limits LUE Assessment LUE Assessment: Within Functional Limits RLE Assessment RLE Assessment: Exceptions to [facility] RLE AROM (degrees) RLE Overall AROM Comments: limited by pain, but [facility] RLE  Strength RLE Overall Strength Comments: limited by pain, unable to bear weight in standing due to pain.  LLE Assessment LLE Assessment: Within Functional Limits   See Function Navigator for Current Functional Status.  Caitlin E Penven-Crew 11/27/2016, 3:18 PM

## 2016-11-27 NOTE — Patient Care Conference (Signed)
Inpatient RehabilitationTeam Conference and Plan of Care Update Date: 11/27/2016   Time: 11:20 AM    Patient Name: Hayden [name]      Medical Record Number: [id number]  Date of Birth: 1971-01-29 Sex: Male         Room/Bed: 4M06C/4M06C-01 Payor Info: Payor: MEDICARE / Plan: MEDICARE PART A AND B / Product Type: *No Product type* /    Admitting Diagnosis: Encephal  Admit Date/Time:  11/20/2016  3:50 PM Admission Comments: No comment available   Primary Diagnosis:  Acute encephalopathy Principal Problem: Acute encephalopathy  Patient Active Problem List   Diagnosis Date Noted  . Neuropathic pain   . Hypertensive crisis   . Labile blood pressure   . Type 2 diabetes mellitus with diabetic peripheral angiopathy and gangrene, without long-term current use of insulin ([place])   . Debility 11/22/2016  . Anemia of chronic disease   . Diabetes mellitus type 2 in nonobese (H[facility])   . Confusion   . S/P femoral-popliteal bypass surgery   . Tobacco abuse   . Benign essential HTN   . History of CVA (cerebrovascular accident)   . Post-operative pain   . Acute respiratory failure with hypoxia ([place])   . Agitation 11/04/2016  . Acute encephalopathy 11/04/2016  . Cellulitis of right lower extremity   . Hypokalemia 11/02/2016  . QT prolongation   . Gangrene of right foot ([place])   . Pressure injury of skin 10/24/2016  . Cellulitis of great toe of right foot 10/23/2016  . Leukocytosis 10/23/2016  . Hyperkalemia 10/23/2016  . Abdominal hematoma 11/15/2015  . HCAP (healthcare-associated pneumonia) 11/15/2015  . Acute blood loss anemia 11/15/2015  . Absolute anemia   . Right lower quadrant pain   . Pneumonia involving right lung 11/04/2015  . HTN (hypertension) 11/04/2015  . Noninfectious gastroenteritis and colitis 11/04/2015  . Gastroenteritis 11/04/2015  . CVA (cerebral infarction) 06/19/2015  . End stage renal disease on dialysis ([place]) 06/19/2015  . Lacunar infarct, acute ([place]) 06/19/2015  .  GERD (gastroesophageal reflux disease) 06/19/2015  . Anxiety and depression 06/19/2015  . Hypertension, well controlled 06/19/2015  . End stage renal disease ([place]) 01/21/2013  . Acute coronary syndrome ([place]) 09/22/2012  . History of partial Right nephrectomy for renal mass (2008) 09/22/2012  . Left ventricular hypertrophy (moderate) per ECHO 2010 09/22/2012  . Chronic systolic congestive heart failure/EF 40% PER echo 2010 09/22/2012  . 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 Acute kidney injury ([place]) 09/21/2012  . Chest pain 09/21/2012    Expected Discharge Date: Expected Discharge Date: 11/28/16  Team Members Present: Physician leading conference: Dr. Delice Lesch Social Worker Present: Ovidio Kin, LCSW Nurse Present: Other (comment) Haywood Lasso Evans-RN) PT Present: Dwyane Dee, PT OT Present: Cherylynn Ridges, OT SLP Present: Weston Anna, SLP PPS Coordinator present : Daiva Nakayama, RN, CRRN     Current Status/Progress Goal Weekly Team Focus  Medical   Debilitation secondary to right first digit amputation/acute encephalopathy/multi-medical  Improve mobility, safety, compliance, WBCs, HTN, ABLA  See above   Bowel/Bladder   continent of bowel and bladder; LBM 5/29  maintain continence  assess for changes in continence q shift and prn   Swallow/Nutrition/ Hydration             ADL's   Supervision overall  mod I/supervision   pt/fmaily ed,, modified bathing/dressing, transfer   Mobility   supervision/mod I  mod I transfers, supervision gait short distance  d/c planning, grad day Wednesday, d/c Thurs   Communication  Safety/Cognition/ Behavioral Observations            Pain   c/o pain to R great toe amputation site, R plantar foot open blister; and R calf; 2 tab hydrocodone Q6 PRN; tylenol 650mg  Q6 PRN; Ativan 1mg  Q6 PRN  <2  assess pain q shift and prn   Skin   R great toe amputation with wet to dry dressing; open plantar blister to R foot with  gauze; steri strips to R thigh incision; R calf ischemic skin and open calf wound with santyl and wet to dry dressing (abd pad)  skin free of further breakdown and infection  assess skin q shift and prn; wound care per orders      *See Care Plan and progress notes for long and short-term goals.  Barriers to Discharge: Pain, HTN, leukocytosis, wound, ESRD    Possible Resolutions to Barriers:  Optimize BP meds, recs per [facility], follow labs, wound recs per Vascular    Discharge Planning/Teaching Needs:  Home with wife who does work swing shift and two adult children. Wife stays here with pt to assist with PD.      Team Discussion:  Goals mod/i-supervision level. Noncompliant with his renal diet. Wound eval-vascular evaluating. Adjusting pain meds-MD. MD also adjusting BP meds. Will need wound care at home.Wife has been in and educated regarding his care, also assists with PD.  Revisions to Treatment Plan:  DC 5/31   Continued Need for Acute Rehabilitation Level of Care: The patient requires daily medical management by a physician with specialized training in physical medicine and rehabilitation for the following conditions: Daily direction of a multidisciplinary physical rehabilitation program to ensure safe treatment while eliciting the highest outcome that is of practical value to the patient.: Yes Daily medical management of patient stability for increased activity during participation in an intensive rehabilitation regime.: Yes Daily analysis of laboratory values and/or radiology reports with any subsequent need for medication adjustment of medical intervention for : Post surgical problems;Wound care problems;Blood pressure problems;Renal problems  Fareeha Evon, Gardiner Rhyme 11/27/2016, 1:16 PM

## 2016-11-27 NOTE — Progress Notes (Addendum)
Occupational Therapy Discharge Summary  Patient Details  Name: Hayden [name] MRN: [id number] Date of Birth: Feb 29, 1971  Today's Date: 11/27/2016 OT Individual Time: 3790-2409 OT Individual Time Calculation (min): 77 min   OT treatment session focused on modified bathing/dressing, dc planning, transfer training, and standing balance/endurance. Pt completed squat-pivot transfers with Mod I. Pt shaved sitting in wc at the sink, then performed bathing/dressing tasks sit<>stand with mod I. Pt demonstrated improved balance strategies and safety awareness. Discussed ADL participation and environmental modifications for dc home. Provided therapeutic listening and encouragement within discussion of pt concern regarding lifestyle changes; pt appreciative. Pt able to don R off-loading shoe and L tennis shoe without assistance, then propelled wc to therapy apartment. Practiced simulated walk-in shower transfer using shower chair. Pt able to set up wc and complete squat-pivot R and L with supervision. OT applied towel cushion to R elevating leg rest and re-iterated importance of maintaining R knee extension. Pt left seated in wc with R LE elevated and needs met.  Patient has met 8 of 8 long term goals due to improved activity tolerance, improved balance, postural control, ability to compensate for deficits and improved coordination.  Patient to discharge at overall Modified Independent level.  Patient's care partner is independent to provide the necessary physical assistance for higher level iADL at discharge.    Reasons goals not met: n/a  Recommendation:  Patient will benefit from ongoing skilled OT services in home health setting to continue to advance functional skills in the area of BADL and iADL.  Equipment: w/c with R elevating leg rest, shower chair, RW  Reasons for discharge: treatment goals met and discharge from hospital  Patient/family agrees with progress made and goals achieved: Yes  OT  Discharge Precautions/Restrictions  Precautions Precautions: Fall Restrictions Weight Bearing Restrictions: No Pain Pain Assessment Pain Assessment: 0-10 Pain Score: 8  Pain Location: Foot Pain Orientation: Right Pain Descriptors / Indicators: Aching Pain Onset: On-going Pain Intervention(s): Repositioned ADL ADL Eating: Independent Grooming: Independent Upper Body Bathing: Independent Where Assessed-Upper Body Bathing: Wheelchair, Sitting at sink Lower Body Bathing: Modified independent Where Assessed-Lower Body Bathing: Wheelchair, Sitting at sink, Standing at sink Upper Body Dressing: Independent Where Assessed-Upper Body Dressing: Wheelchair Lower Body Dressing: Modified independent Where Assessed-Lower Body Dressing: Wheelchair Toileting: Modified independent Toilet Transfer: Modified independent Armed forces technical officer Method: Nutritional therapist: Distant supervision Social research officer, government Method: Education officer, environmental: Civil engineer, contracting with back ADL Comments: Please see Oceanographer: Within Functional Limits Praxis Praxis: Intact Cognition Overall Cognitive Status: Within Functional Limits for tasks assessed Arousal/Alertness: Awake/alert Orientation Level: Oriented X4 Safety/Judgment: Appears intact Sensation Sensation Light Touch: Impaired Detail Light Touch Impaired Details: Impaired RLE (below knee to foot) Proprioception: Appears Intact Coordination Gross Motor Movements are Fluid and Coordinated: Yes Fine Motor Movements are Fluid and Coordinated: Yes Motor  Motor Motor: Within Functional Limits Mobility  Bed Mobility Bed Mobility: Supine to Sit;Sit to Supine Supine to Sit: 6: Modified independent (Device/Increase time) Sit to Supine: 6: Modified independent (Device/Increase time)  Trunk/Postural Assessment  Cervical Assessment Cervical Assessment: Within Functional Limits Thoracic  Assessment Thoracic Assessment: Within Functional Limits Lumbar Assessment Lumbar Assessment: Within Functional Limits Postural Control Postural Control: Within Functional Limits  Balance Balance Balance Assessed: Yes Dynamic Sitting Balance Dynamic Sitting - Balance Support: Feet supported;No upper extremity supported Dynamic Sitting - Level of Assistance: 6: Modified independent (Device/Increase time) Static Standing Balance Static Standing - Balance Support: During functional activity Static Standing -  Level of Assistance: 6: Modified independent (Device/Increase time) Dynamic Standing Balance Dynamic Standing - Balance Support: During functional activity Dynamic Standing - Level of Assistance: 6: Modified independent (Device/Increase time) Extremity/Trunk Assessment RUE Assessment RUE Assessment: Within Functional Limits LUE Assessment LUE Assessment: Within Functional Limits   See Function Navigator for Current Functional Status.  Daneen Schick Damia Bobrowski 11/27/2016, 1:46 PM

## 2016-11-27 NOTE — Progress Notes (Signed)
Social Work Patient ID: Hayden [name], male   DOB: 1970/10/25, 46 y.o.   MRN: [id number]  Met with pt to discuss team conference goals mod/i with some supervision and target discharge date 5/31. Pt feels ready and wants To go home. Will set up home health and wheelchair for home. Work on finding home health agency to see pt in [place].

## 2016-11-27 NOTE — Progress Notes (Signed)
Social Work Christo Hain, Eliezer Champagne Social Worker Signed   Patient Care Conference Date of Service: 11/27/2016  1:16 PM      Hide copied text Hover for attribution information Inpatient RehabilitationTeam Conference and Plan of Care Update Date: 11/27/2016   Time: 11:20 AM      Patient Name: Hayden [name]      Medical Record Number: [id number]  Date of Birth: 03/08/1971 Sex: Male         Room/Bed: 4M06C/4M06C-01 Payor Info: Payor: MEDICARE / Plan: MEDICARE PART A AND B / Product Type: *No Product type* /     Admitting Diagnosis: Encephal  Admit Date/Time:  11/20/2016  3:50 PM Admission Comments: No comment available    Primary Diagnosis:  Acute encephalopathy Principal Problem: Acute encephalopathy       Patient Active Problem List    Diagnosis Date Noted  . Neuropathic pain    . Hypertensive crisis    . Labile blood pressure    . Type 2 diabetes mellitus with diabetic peripheral angiopathy and gangrene, without long-term current use of insulin ([place])    . Debility 11/22/2016  . Anemia of chronic disease    . Diabetes mellitus type 2 in nonobese ([facility])    . Confusion    . S/P femoral-popliteal bypass surgery    . Tobacco abuse    . Benign essential HTN    . History of CVA (cerebrovascular accident)    . Post-operative pain    . Acute respiratory failure with hypoxia ([place])    . Agitation 11/04/2016  . Acute encephalopathy 11/04/2016  . Cellulitis of right lower extremity    . Hypokalemia 11/02/2016  . QT prolongation    . Gangrene of right foot ([place])    . Pressure injury of skin 10/24/2016  . Cellulitis of great toe of right foot 10/23/2016  . Leukocytosis 10/23/2016  . Hyperkalemia 10/23/2016  . Abdominal hematoma 11/15/2015  . HCAP (healthcare-associated pneumonia) 11/15/2015  . Acute blood loss anemia 11/15/2015  . Absolute anemia    . Right lower quadrant pain    . Pneumonia involving right lung 11/04/2015  . HTN (hypertension) 11/04/2015  . Noninfectious  gastroenteritis and colitis 11/04/2015  . Gastroenteritis 11/04/2015  . CVA (cerebral infarction) 06/19/2015  . End stage renal disease on dialysis ([place]) 06/19/2015  . Lacunar infarct, acute ([place]) 06/19/2015  . GERD (gastroesophageal reflux disease) 06/19/2015  . Anxiety and depression 06/19/2015  . Hypertension, well controlled 06/19/2015  . End stage renal disease ([place]) 01/21/2013  . Acute coronary syndrome ([place]) 09/22/2012  . History of partial Right nephrectomy for renal mass (2008) 09/22/2012  . Left ventricular hypertrophy (moderate) per ECHO 2010 09/22/2012  . Chronic systolic congestive heart failure/EF 40% PER echo 2010 09/22/2012  . 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 Acute kidney injury ([place]) 09/21/2012  . Chest pain 09/21/2012      Expected Discharge Date: Expected Discharge Date: 11/28/16   Team Members Present: Physician leading conference: Dr. Delice Lesch Social Worker Present: Ovidio Kin, LCSW Nurse Present: Other (comment) Haywood Lasso Evans-RN) PT Present: Dwyane Dee, PT OT Present: Cherylynn Ridges, OT SLP Present: Weston Anna, SLP PPS Coordinator present : Daiva Nakayama, RN, CRRN       Current Status/Progress Goal Weekly Team Focus  Medical     Debilitation secondary to right first digit amputation/acute encephalopathy/multi-medical  Improve mobility, safety, compliance, WBCs, HTN, ABLA  See above   Bowel/Bladder     continent of bowel and bladder; LBM 5/29  maintain continence  assess for changes in continence q shift and prn   Swallow/Nutrition/ Hydration               ADL's     Supervision overall  mod I/supervision   pt/fmaily ed,, modified bathing/dressing, transfer   Mobility     supervision/mod I  mod I transfers, supervision gait short distance  d/c planning, grad day Wednesday, d/c Thurs   Communication               Safety/Cognition/ Behavioral Observations             Pain     c/o pain to R great toe amputation site, R  plantar foot open blister; and R calf; 2 tab hydrocodone Q6 PRN; tylenol 650mg  Q6 PRN; Ativan 1mg  Q6 PRN  <2  assess pain q shift and prn   Skin     R great toe amputation with wet to dry dressing; open plantar blister to R foot with gauze; steri strips to R thigh incision; R calf ischemic skin and open calf wound with santyl and wet to dry dressing (abd pad)  skin free of further breakdown and infection  assess skin q shift and prn; wound care per orders     *See Care Plan and progress notes for long and short-term goals.   Barriers to Discharge: Pain, HTN, leukocytosis, wound, ESRD     Possible Resolutions to Barriers:  Optimize BP meds, recs per [facility], follow labs, wound recs per Vascular     Discharge Planning/Teaching Needs:  Home with wife who does work swing shift and two adult children. Wife stays here with pt to assist with PD.      Team Discussion:  Goals mod/i-supervision level. Noncompliant with his renal diet. Wound eval-vascular evaluating. Adjusting pain meds-MD. MD also adjusting BP meds. Will need wound care at home.Wife has been in and educated regarding his care, also assists with PD.  Revisions to Treatment Plan:  DC 5/31    Continued Need for Acute Rehabilitation Level of Care: The patient requires daily medical management by a physician with specialized training in physical medicine and rehabilitation for the following conditions: Daily direction of a multidisciplinary physical rehabilitation program to ensure safe treatment while eliciting the highest outcome that is of practical value to the patient.: Yes Daily medical management of patient stability for increased activity during participation in an intensive rehabilitation regime.: Yes Daily analysis of laboratory values and/or radiology reports with any subsequent need for medication adjustment of medical intervention for : Post surgical problems;Wound care problems;Blood pressure problems;Renal problems   Elease Hashimoto 11/27/2016, 1:16 PM       Patient ID: FREELAND PRACHT, male   DOB: Sep 14, 1970, 46 y.o.   MRN: [id number]

## 2016-11-27 NOTE — Progress Notes (Signed)
Social Work Patient ID: Hayden [name], male   DOB: 1971/02/03, 46 y.o.   MRN: [id number]  Met with pt to ask about equipment and tell his the walker or wheelchair is covered for Medicare not both. He reports he has access to  A rolling walker and tub seat, will then only order a wheelchair for home. He is aware plan for discharge tomorrow and is looking forward to this since he has been in the hospital so long. See after team conference.

## 2016-11-27 NOTE — Progress Notes (Signed)
[place] PHYSICAL MEDICINE & REHABILITATION     PROGRESS NOTE    Subjective/Complaints: Pt seen laying in bed this AM.  He slept well overnight, but again appears to be tangential this AM.  He complains of right foot pain.    ROS: +Right foot pain. Denies CP, SOB, N/V/D.   Objective: Vital Signs: Blood pressure (!) 158/69, pulse 83, temperature 98.7 F (37.1 C), temperature source Oral, resp. rate 17, weight 84.9 kg (187 lb 2.7 oz), SpO2 96 %. No results found.  Recent Labs  11/25/16 1048  WBC 16.6*  HGB 8.4*  HCT 28.0*  PLT 547*    Recent Labs  11/25/16 1048  NA 136  K 4.4  CL 100*  GLUCOSE 113*  BUN 63*  CREATININE 8.66*  CALCIUM 8.3*   CBG (last 3)   Recent Labs  11/25/16 1640  GLUCAP 126*    Wt Readings from Last 3 Encounters:  11/27/16 84.9 kg (187 lb 2.7 oz)  11/19/16 84.8 kg (187 lb)  07/12/16 99.8 kg (220 lb)    Physical Exam:  Constitutional: He appears well-developed and well-nourished.  HENT: Normocephalic and atraumatic.  Eyes: EOMI. No discharge.  Cardiovascular: Irregular with systolic murmur. No JVD. Respiratory: Normal effort, CTA bilaterally.  GI: Soft. Bowel sounds are normal.  Musculoskeletal: He exhibits edema and tenderness.  Neurological: He is alert.  Motor: B/l UE, LLE: 5/5 proximal to distal RLE: HF 4/5, KE 4/5, wiggles toes (improving) Sensation intact to light touch  Skin:  RLE incision c/d/i Psychiatric: Impulsive. Tangential  Assessment/Plan: 1. Functional and cognitive deficits secondary to debility and encephalopathy which require 3+ hours per day of interdisciplinary therapy in a comprehensive inpatient rehab setting. Physiatrist is providing close team supervision and 24 hour management of active medical problems listed below. Physiatrist and rehab team continue to assess barriers to discharge/monitor patient progress toward functional and medical goals.  Function:  Bathing Bathing position   Position:  Wheelchair/chair at sink  Bathing parts Body parts bathed by patient: Right arm, Left arm, Chest, Left upper leg, Left lower leg Body parts bathed by helper: Left lower leg  Bathing assist Assist Level: Supervision or verbal cues      Upper Body Dressing/Undressing Upper body dressing   What is the patient wearing?: Pull over shirt/dress     Pull over shirt/dress - Perfomed by patient: Thread/unthread right sleeve, Thread/unthread left sleeve, Put head through opening, Pull shirt over trunk          Upper body assist Assist Level: Set up   Set up : To obtain clothing/put away  Lower Body Dressing/Undressing Lower body dressing   What is the patient wearing?: Pants, Socks     Pants- Performed by patient: Thread/unthread right pants leg, Thread/unthread left pants leg, Pull pants up/down Pants- Performed by helper: Thread/unthread right pants leg, Pull pants up/down Non-skid slipper socks- Performed by patient: Don/doff left sock Non-skid slipper socks- Performed by helper: Don/doff left sock (R foot with dressing) Socks - Performed by patient: Don/doff left sock, Don/doff right sock                Lower body assist Assist for lower body dressing: Supervision or verbal cues      Toileting Toileting Toileting activity did not occur:  (gown worn) Toileting steps completed by patient: Performs perineal hygiene, Adjust clothing after toileting, Adjust clothing prior to toileting Toileting steps completed by helper: Adjust clothing prior to toileting, Adjust clothing after toileting Toileting Assistive Devices:  Grab bar or rail  Toileting assist Assist level: Set up/obtain supplies   Transfers Chair/bed transfer   Chair/bed transfer method: Stand pivot, Squat pivot Chair/bed transfer assist level: No Help, no cues, assistive device, takes more than a reasonable amount of time Chair/bed transfer assistive device: Armrests     Locomotion Ambulation     Max distance:  31 Assist level: Supervision or verbal cues   Wheelchair   Type: Manual Max wheelchair distance: 150 Assist Level: No help, No cues, assistive device, takes more than reasonable amount of time  Cognition Comprehension Comprehension assist level: Follows complex conversation/direction with extra time/assistive device  Expression Expression assist level: Expresses complex ideas: With extra time/assistive device  Social Interaction Social Interaction assist level: Interacts appropriately with others with medication or extra time (anti-anxiety, antidepressant).  Problem Solving Problem solving assist level: Solves basic problems with no assist  Memory Memory assist level: Recognizes or recalls 75 - 89% of the time/requires cueing 10 - 24% of the time   Medical Problem List and Plan: 1.  Debilitation secondary to right first digit amputation/acute encephalopathy/multi-medical  Cont CIR 2.  DVT Prophylaxis/Anticoagulation: Subcutaneous heparin. Monitor for any bleeding episodes 3. Pain Management: Hydrocodone as needed 4. Mood: Lexapro 10 mg daily, Depakote 500 mg every 12 hours, Ativan as needed 5. Neuropsych: This patient is not capable of making decisions on his own behalf. 6. Skin/Wound Care: compressive dressing RLE  -VVS assist appreciated, Santyl per Dr Bridgett Larsson. Appreciate recs 7. Fluids/Electrolytes/Nutrition: Routine I&Os 8. ESRD. Continue dialysis as per renal services 9. Hypertension. Clonidine patch 0.2 mg weekly, Isordil 5 mg twice a day, Norvasc 10 mg daily, labetalol 300 mg twice a day  Hydralazine 75, increased to 100 on 5/28.  Labile at present  Will adjust meds if persistent elevation 10. Diabetes mellitus. Latest hemoglobin A1c 5.4. Check blood sugars before meals and at bedtime.  Pt upset and refusing CBGs, will d/c  Controlled 5/28 11. Chronic congestive heart failure. Monitor for any signs of fluid overload 12. Acute on chronic anemia.   Hb 8.4 on 5/28  Cont to  monitor  Labs ordered for tomorrow 13. Tobacco abuse. Counseling 14. Leukocytosis:  WBCs 16.6 on 5/28  -wound follow up as above, appreciate vascular assist,   -afebrile    Labs ordered for tomorrow  LOS (Days) 7 A FACE TO FACE EVALUATION WAS PERFORMED  Ankit Lorie Phenix, MD 11/27/2016 9:08 AM

## 2016-11-28 LAB — CBC WITH DIFFERENTIAL/PLATELET
BASOS PCT: 1 %
Basophils Absolute: 0.2 10*3/uL — ABNORMAL HIGH (ref 0.0–0.1)
EOS PCT: 5 %
Eosinophils Absolute: 0.9 10*3/uL — ABNORMAL HIGH (ref 0.0–0.7)
HCT: 29.7 % — ABNORMAL LOW (ref 39.0–52.0)
HEMOGLOBIN: 9 g/dL — AB (ref 13.0–17.0)
Lymphocytes Relative: 11 %
Lymphs Abs: 1.9 10*3/uL (ref 0.7–4.0)
MCH: 25.6 pg — AB (ref 26.0–34.0)
MCHC: 30.3 g/dL (ref 30.0–36.0)
MCV: 84.4 fL (ref 78.0–100.0)
MONO ABS: 1.7 10*3/uL — AB (ref 0.1–1.0)
Monocytes Relative: 10 %
NEUTROS PCT: 73 %
Neutro Abs: 12.7 10*3/uL — ABNORMAL HIGH (ref 1.7–7.7)
PLATELETS: 625 10*3/uL — AB (ref 150–400)
RBC: 3.52 MIL/uL — ABNORMAL LOW (ref 4.22–5.81)
RDW: 21.1 % — ABNORMAL HIGH (ref 11.5–15.5)
WBC: 17.4 10*3/uL — ABNORMAL HIGH (ref 4.0–10.5)

## 2016-11-28 LAB — BASIC METABOLIC PANEL
Anion gap: 14 (ref 5–15)
BUN: 79 mg/dL — ABNORMAL HIGH (ref 6–20)
CHLORIDE: 99 mmol/L — AB (ref 101–111)
CO2: 25 mmol/L (ref 22–32)
CREATININE: 8.88 mg/dL — AB (ref 0.61–1.24)
Calcium: 8.6 mg/dL — ABNORMAL LOW (ref 8.9–10.3)
GFR calc non Af Amer: 6 mL/min — ABNORMAL LOW (ref >60)
GFR, EST AFRICAN AMERICAN: 7 mL/min — AB (ref >60)
GLUCOSE: 94 mg/dL (ref 65–99)
Potassium: 4 mmol/L (ref 3.5–5.1)
Sodium: 138 mmol/L (ref 135–145)

## 2016-11-28 MED ORDER — AMLODIPINE BESYLATE 10 MG PO TABS: 10.0000 mg | ORAL_TABLET | Freq: Every day | ORAL | 0 refills | Status: DC

## 2016-11-28 MED ORDER — LABETALOL HCL 300 MG PO TABS: 300.0000 mg | ORAL_TABLET | Freq: Two times a day (BID) | ORAL | 0 refills | Status: DC

## 2016-11-28 MED ORDER — COLLAGENASE 250 UNIT/GM EX OINT: TOPICAL_OINTMENT | Freq: Every day | CUTANEOUS | 0 refills | Status: DC

## 2016-11-28 MED ORDER — FERRIC CITRATE 1 GM 210 MG(FE) PO TABS: 420.0000 mg | ORAL_TABLET | Freq: Three times a day (TID) | ORAL | 0 refills | Status: DC

## 2016-11-28 MED ORDER — CALCITRIOL 0.5 MCG PO CAPS: 0.5000 ug | ORAL_CAPSULE | Freq: Every day | ORAL | 0 refills | Status: DC

## 2016-11-28 MED ORDER — ISOSORBIDE DINITRATE 5 MG PO TABS: 5.0000 mg | ORAL_TABLET | Freq: Two times a day (BID) | ORAL | 0 refills | Status: DC

## 2016-11-28 MED ORDER — OMEPRAZOLE 40 MG PO CPDR: 40.0000 mg | DELAYED_RELEASE_CAPSULE | Freq: Every day | ORAL | 0 refills | Status: DC

## 2016-11-28 MED ORDER — RENA-VITE PO TABS: 1.0000 | ORAL_TABLET | Freq: Every day | ORAL | 0 refills | Status: DC

## 2016-11-28 MED ORDER — PRAVASTATIN SODIUM 20 MG PO TABS: 20.0000 mg | ORAL_TABLET | Freq: Every day | ORAL | 0 refills | Status: DC

## 2016-11-28 MED ORDER — LORAZEPAM 1 MG PO TABS: 1.0000 mg | ORAL_TABLET | Freq: Four times a day (QID) | ORAL | 0 refills | Status: DC | PRN

## 2016-11-28 MED ORDER — HYDRALAZINE HCL 100 MG PO TABS: 100.0000 mg | ORAL_TABLET | Freq: Three times a day (TID) | ORAL | 0 refills | Status: DC

## 2016-11-28 MED ORDER — CLONIDINE HCL 0.2 MG/24HR TD PTWK: 0.2000 mg | MEDICATED_PATCH | TRANSDERMAL | 12 refills | Status: DC

## 2016-11-28 MED ORDER — CINACALCET HCL 60 MG PO TABS: 60.0000 mg | ORAL_TABLET | Freq: Every day | ORAL | 0 refills | Status: DC

## 2016-11-28 MED ORDER — ESCITALOPRAM OXALATE 10 MG PO TABS: 10.0000 mg | ORAL_TABLET | Freq: Every day | ORAL | 0 refills | Status: DC

## 2016-11-28 MED ORDER — GABAPENTIN 100 MG PO CAPS: 100.0000 mg | ORAL_CAPSULE | Freq: Three times a day (TID) | ORAL | 0 refills | Status: DC

## 2016-11-28 MED ORDER — DOCUSATE SODIUM 100 MG PO CAPS: 100.0000 mg | ORAL_CAPSULE | Freq: Every day | ORAL | 0 refills | Status: DC

## 2016-11-28 MED ORDER — SACCHAROMYCES BOULARDII 250 MG PO CAPS: 250.0000 mg | ORAL_CAPSULE | Freq: Two times a day (BID) | ORAL | 0 refills | Status: DC

## 2016-11-28 MED ORDER — DIVALPROEX SODIUM 125 MG PO CSDR: 500.0000 mg | DELAYED_RELEASE_CAPSULE | Freq: Two times a day (BID) | ORAL | 0 refills | Status: DC

## 2016-11-28 MED ORDER — HYDROCODONE-ACETAMINOPHEN 5-325 MG PO TABS: 1.0000 | ORAL_TABLET | Freq: Four times a day (QID) | ORAL | 0 refills | Status: DC | PRN

## 2016-11-28 NOTE — Progress Notes (Signed)
Assessment/Plan:  1. Ischemic R foot / sp R fem-pop bypass w great toe amp on 5/8 -. 2. AMS/delerium - resolved and back at baseline 3. ESRD - CCPD. Adjust for BP 4. Anemia - (Aranesp 200 QSunday) 5. MBD- restart calcitriol 6. HTN, suboptimal.Use 10 liters 4.25% & 5 liters 2.5% dialysate. BP meds amlodipine/clonidine/hydralazine/labetolol  7. Dispo - anticipated d/Hayden [name] date 11/29/16   Subjective: Interval History: BP too high and weight up UF about 2 liters  Objective: Vital signs in last 24 hours: Temp:  [98.2 F (36.8 Hayden [name])-98.7 F (37.1 Hayden [name])] 98.7 F (37.1 Hayden [name]) (05/31 0849) Pulse Rate:  [80-86] 86 (05/31 0849) Resp:  [18] 18 (05/31 0849) BP: (130-180)/(60-75) 180/71 (05/31 0849) SpO2:  [96 %-98 %] 96 % (05/31 0500) Weight:  [85.1 kg (187 lb 9.6 oz)-87 kg (191 lb 12.8 oz)] 87 kg (191 lb 12.8 oz) (05/31 0543) Weight change: -0.605 kg (-1 lb 5.4 oz)  Intake/Output from previous day: 05/30 0701 - 05/31 0700 In: 14970 [P.O.:480] Out: 17226 [Urine:225] Intake/Output this shift: Total I/O In: 26378 [P.O.:240; HYIFO:27741] Out: 17295 [Other:17295]  Head: Normocephalic, without obvious abnormality, atraumatic Resp: clear to auscultation bilaterally Chest wall: no tenderness GI: soft, non-tender; bowel sounds normal; no masses,  no organomegaly and Cath nontender  AVF, LUE ok  Lab Results:  Recent Labs  11/28/16 0547  WBC 17.4*  HGB 9.0*  HCT 29.7*  PLT 625*   BMET:  Recent Labs  11/28/16 0547  NA 138  K 4.0  CL 99*  CO2 25  GLUCOSE 94  BUN 79*  CREATININE 8.88*  CALCIUM 8.6*   No results for input(s): PTH in the last 72 hours. Iron Studies: No results for input(s): IRON, TIBC, TRANSFERRIN, FERRITIN in the last 72 hours. Studies/Results: No results found.  Scheduled: . amLODipine  10 mg Oral Daily  . aspirin  81 mg Oral Daily  . calcitRIOL  0.5 mcg Oral Daily  . cinacalcet  60 mg Oral Q supper  . cloNIDine  0.2 mg Transdermal Q Thu  . collagenase   Topical  Daily  . darbepoetin (ARANESP) injection - NON-DIALYSIS  200 mcg Subcutaneous Q Sun-1800  . divalproex  500 mg Oral Q12H  . docusate sodium  100 mg Oral Daily  . escitalopram  10 mg Oral Daily  . feeding supplement (ENSURE ENLIVE)  237 mL Oral BID BM  . feeding supplement (NEPRO CARB STEADY)  237 mL Oral Q1500  . ferric citrate  420 mg Oral TID WC  . gabapentin  100 mg Oral TID  . gentamicin cream  1 application Topical Daily  . heparin  5,000 Units Subcutaneous Q8H  . hydrALAZINE  100 mg Oral Q8H  . isosorbide dinitrate  5 mg Oral BID  . labetalol  300 mg Oral BID  . multivitamin  1 tablet Oral QHS  . pantoprazole  40 mg Oral Daily  . pravastatin  20 mg Oral Daily  . saccharomyces boulardii  250 mg Oral BID      LOS: 8 days   Hayden Hayden [name] 11/28/2016,1:44 PM

## 2016-11-28 NOTE — Progress Notes (Signed)
Social Work  Discharge Note  The overall goal for the admission was met for:   Discharge location: [place]  Length of Stay: Yes-8 DAYS  Discharge activity level: Yes-SUPERVISION-MOD/I LEVEL  Home/community participation: Yes  Services provided included: MD, RD, PT, OT, SLP, RN, CM, Pharmacy, Neuropsych and SW  Financial Services: Medicare and Private Insurance: [facility]  Follow-up services arranged: Home Health: [facility], DME: [facility] and Patient/Family has no preference for HH/DME agencies  Comments (or additional information):WIFE STAYED [place] PD, AWARE OF HIS CARE NEEDS. MAY BE HOME FOR ONE HOUR BETWEEN WIFE AND CHILDREN'S SCHEDULES PT NON-COMPLIANT WITH RENAL DIET. READY FOR DC TODAY. FAMILY EDUCATION COMPLETED  Patient/Family verbalized understanding of follow-up arrangements: Yes  Individual responsible for coordination of the follow-up plan: [name]-WIFE  Confirmed correct DME delivered: Elease Hashimoto 11/28/2016    Elease Hashimoto

## 2016-11-28 NOTE — Progress Notes (Signed)
[facility] PHYSICAL MEDICINE & REHABILITATION     PROGRESS NOTE    Subjective/Complaints: Pt seen laying in bed this AM.  He slept well overnight. He is ready for discharge.   ROS: Denies CP, SOB, N/V/D.  Objective: Vital Signs: Blood pressure (!) 175/75, pulse 86, temperature 98.2 F (36.8 C), temperature source Oral, resp. rate 18, weight 87 kg (191 lb 12.8 oz), SpO2 96 %. No results found.  Recent Labs  11/25/16 1048 11/28/16 0547  WBC 16.6* 17.4*  HGB 8.4* 9.0*  HCT 28.0* 29.7*  PLT 547* 625*    Recent Labs  11/25/16 1048 11/28/16 0547  NA 136 138  K 4.4 4.0  CL 100* 99*  GLUCOSE 113* 94  BUN 63* 79*  CREATININE 8.66* 8.88*  CALCIUM 8.3* 8.6*   CBG (last 3)   Recent Labs  11/25/16 1640  GLUCAP 126*    Wt Readings from Last 3 Encounters:  11/28/16 87 kg (191 lb 12.8 oz)  11/19/16 84.8 kg (187 lb)  07/12/16 99.8 kg (220 lb)    Physical Exam:  Constitutional: He appears well-developed and well-nourished.  HENT: Normocephalic and atraumatic.  Eyes: EOMI. No discharge.  Cardiovascular: Irregular with systolic murmur. No JVD. Respiratory: Normal effort, CTA bilaterally.  GI: Soft. Bowel sounds are normal.  Musculoskeletal: He exhibits edema and tenderness.  Neurological: He is alert.  Motor: B/l UE, LLE: 5/5 proximal to distal RLE: HF 4+/5, KE 4+/5, wiggles toes (stable) Sensation intact to light touch  Skin:  RLE incision with drainage from proximal wound, foot without dressing dried blood. Psychiatric: Impulsive. Tangential  Assessment/Plan: 1. Functional and cognitive deficits secondary to debility and encephalopathy which require 3+ hours per day of interdisciplinary therapy in a comprehensive inpatient rehab setting. Physiatrist is providing close team supervision and 24 hour management of active medical problems listed below. Physiatrist and rehab team continue to assess barriers to discharge/monitor patient progress toward functional and  medical goals.  Function:  Bathing Bathing position   Position: Wheelchair/chair at sink  Bathing parts Body parts bathed by patient: Right arm, Left arm, Chest, Buttocks, Right upper leg, Abdomen, Front perineal area, Left upper leg, Left lower leg, Right lower leg Body parts bathed by helper: Left lower leg  Bathing assist Assist Level: No help, No cues      Upper Body Dressing/Undressing Upper body dressing   What is the patient wearing?: Pull over shirt/dress     Pull over shirt/dress - Perfomed by patient: Thread/unthread right sleeve, Pull shirt over trunk, Thread/unthread left sleeve, Put head through opening          Upper body assist Assist Level: No help, No cues   Set up : To obtain clothing/put away  Lower Body Dressing/Undressing Lower body dressing   What is the patient wearing?: Pants, Socks, Shoes     Pants- Performed by patient: Thread/unthread right pants leg, Thread/unthread left pants leg, Pull pants up/down Pants- Performed by helper: Thread/unthread right pants leg, Pull pants up/down Non-skid slipper socks- Performed by patient: Don/doff left sock Non-skid slipper socks- Performed by helper: Don/doff left sock (R foot with dressing) Socks - Performed by patient: Don/doff left sock (no R sock 2/2 wound dressing)   Shoes - Performed by patient: Don/doff right shoe, Don/doff left shoe, Fasten right, Fasten left            Lower body assist Assist for lower body dressing: No Help, No cues      Toileting Toileting Toileting activity did  not occur:  (gown worn) Toileting steps completed by patient: Performs perineal hygiene, Adjust clothing prior to toileting, Adjust clothing after toileting Toileting steps completed by helper: Adjust clothing prior to toileting, Adjust clothing after toileting Toileting Assistive Devices: Grab bar or rail  Toileting assist Assist level: More than reasonable time   Transfers Chair/bed transfer   Chair/bed  transfer method: Squat pivot Chair/bed transfer assist level: No Help, no cues, assistive device, takes more than a reasonable amount of time Chair/bed transfer assistive device: Armrests     Locomotion Ambulation     Max distance: 53ft  Assist level: Supervision or verbal cues   Wheelchair   Type: Manual Max wheelchair distance: 261ft  Assist Level: No help, No cues, assistive device, takes more than reasonable amount of time  Cognition Comprehension Comprehension assist level: Follows complex conversation/direction with extra time/assistive device  Expression Expression assist level: Expresses complex ideas: With extra time/assistive device  Social Interaction Social Interaction assist level: Interacts appropriately with others with medication or extra time (anti-anxiety, antidepressant).  Problem Solving Problem solving assist level: Solves basic problems with no assist  Memory Memory assist level: Recognizes or recalls 90% of the time/requires cueing < 10% of the time   Medical Problem List and Plan: 1.  Debilitation secondary to right first digit amputation/acute encephalopathy/multi-medical  Cont CIR 2.  DVT Prophylaxis/Anticoagulation: Subcutaneous heparin. Monitor for any bleeding episodes 3. Pain Management: Hydrocodone as needed 4. Mood: Lexapro 10 mg daily, Depakote 500 mg every 12 hours, Ativan as needed 5. Neuropsych: This patient is not capable of making decisions on his own behalf. 6. Skin/Wound Care: compressive dressing RLE  -VVS assist appreciated, Santyl per Dr Bridgett Larsson. Appreciate recs 7. Fluids/Electrolytes/Nutrition: Routine I&Os 8. ESRD. Continue dialysis as per renal services 9. Hypertension. Clonidine patch 0.2 mg weekly, Isordil 5 mg twice a day, Norvasc 10 mg daily, labetalol 300 mg twice a day  Hydralazine 75, increased to 100 on 5/28.  Labile at with PD  Will adjust meds if persistent elevation 10. Diabetes mellitus. Latest hemoglobin A1c 5.4. Check blood  sugars before meals and at bedtime.  Pt upset and refusing CBGs, will d/c  Controlled 5/31 11. Chronic congestive heart failure. Monitor for any signs of fluid overload 12. Acute on chronic anemia.   Hb 9.0 on 5/31  Cont to monitor 13. Tobacco abuse. Counseling 14. Leukocytosis:  WBCs 17.4 on 5/31  -wound follow up as above, appreciate vascular assist,   -afebrile    LOS (Days) 8 A FACE TO FACE EVALUATION WAS PERFORMED  Ankit Lorie Phenix, MD 11/28/2016 8:05 AM

## 2016-12-02 ENCOUNTER — Ambulatory Visit (INDEPENDENT_AMBULATORY_CARE_PROVIDER_SITE_OTHER): Payer: Medicare Other | Admitting: Orthopedic Surgery

## 2016-12-02 ENCOUNTER — Encounter (INDEPENDENT_AMBULATORY_CARE_PROVIDER_SITE_OTHER): Payer: Self-pay | Admitting: Orthopedic Surgery

## 2016-12-02 VITALS — Ht 70.0 in | Wt 191.0 lb

## 2016-12-02 DIAGNOSIS — I7025 Atherosclerosis of native arteries of other extremities with ulceration: Secondary | ICD-10-CM | POA: Insufficient documentation

## 2016-12-02 DIAGNOSIS — I70261 Atherosclerosis of native arteries of extremities with gangrene, right leg: Secondary | ICD-10-CM | POA: Diagnosis not present

## 2016-12-02 MED ORDER — HYDROCODONE-ACETAMINOPHEN 5-325 MG PO TABS: 1.0000 | ORAL_TABLET | ORAL | 0 refills | Status: DC | PRN

## 2016-12-02 NOTE — Addendum Note (Signed)
Addendum  created 12/02/16 1442 by Andrw Mcguirt, MD   Sign clinical note    

## 2016-12-02 NOTE — Progress Notes (Signed)
Office Visit Note   Patient: Hayden [name]           Date of Birth: 03/30/1971           MRN: [id number] Visit Date: 12/02/2016              Requested by: Corliss Parish, MD [address] [place], [place] [address] PCP: Corliss Parish, MD  Chief Complaint  Patient presents with  . Right Foot - Injury    Hx great toe amputation 11/05/16      HPI: Patient is a 46 year old gentleman with a history of diabetes currently patient states he is not on medication for diabetes he has end-stage renal disease on peritoneal dialysis daily. Patient is status post revascularization with Dr. Bridgett Larsson for the right lower extremity he presents at this time with liquefied necrotic proximal tibial muscle a gangrenous ulcer of the right great toe and a flexion contraction of the knee.  Assessment & Plan: Visit Diagnoses:  1. Atherosclerosis of native arteries of extremities with gangrene, right leg ([place])     Plan: We will plan for an above-the-knee amputation. Discussed that with the liquefied anterior and posterior compartment of the right leg and the 45 flexion contracture the knee patient does not have any limb salvage options. We will plan for above-the-knee amputation on Wednesday. Patient did have prolonged hospitalization for his last surgery and discussed his hospital stay should be less than 5 days.  Follow-Up Instructions: Return in about 2 weeks (around 12/16/2016).   Ortho Exam  Patient is alert, oriented, no adenopathy, well-dressed, normal affect, normal respiratory effort. Examination patient does have involuntary tremor involving his upper extremities. Examination the right lower extremity he has a black gangrenous ulcer with cellulitis over the right great toe amputation site. His amputation incisions have dehisced there is liquefied muscle over the posterior and anterior aspect of the proximal tibia with a wound that probes approximately 8 cm deep there is no purulence but there is  liquefied muscle with eschar. There is no odor there is no purulence. Patient  temperature 99.3.  Imaging: No results found.  Labs: Lab Results  Component Value Date   HGBA1C 5.4 06/20/2015   HGBA1C 9.9 (H) 09/22/2012   HGBA1C (H) 12/22/2008    11.5 (NOTE) The ADA recommends the following therapeutic goal for glycemic control related to Hgb A1c measurement: Goal of therapy: <6.5 Hgb A1c  Reference: American Diabetes Association: Clinical Practice Recommendations 2010, Diabetes Care, 2010, 33: (Suppl  1).   REPTSTATUS 11/14/2016 FINAL 11/09/2016   REPTSTATUS 11/09/2016 FINAL 11/09/2016   GRAMSTAIN NO WBC SEEN NO ORGANISMS SEEN  11/09/2016   CULT NO GROWTH 5 DAYS 11/09/2016    Orders:  No orders of the defined types were placed in this encounter.  No orders of the defined types were placed in this encounter.    Procedures: No procedures performed  Clinical Data: No additional findings.  ROS:  All other systems negative, except as noted in the HPI. Review of Systems  Objective: Vital Signs: Ht 5\' 10"  (1.778 m)   Wt 191 lb (86.6 kg)   BMI 27.41 kg/m   Specialty Comments:  No specialty comments available.  PMFS History: Patient Active Problem List   Diagnosis Date Noted  . Atherosclerosis of native arteries of extremities with gangrene, right leg ([place]) 12/02/2016  . Neuropathic pain   . Hypertensive crisis   . Labile blood pressure   . Type 2 diabetes mellitus with diabetic peripheral angiopathy and  gangrene, without long-term current use of insulin ([place])   . Debility 11/22/2016  . Anemia of chronic disease   . Diabetes mellitus type 2 in nonobese (HCC)   . Confusion   . S/P femoral-popliteal bypass surgery   . Tobacco abuse   . Benign essential HTN   . History of CVA (cerebrovascular accident)   . Post-operative pain   . Acute respiratory failure with hypoxia ([place])   . Agitation 11/04/2016  . Acute encephalopathy 11/04/2016  . Cellulitis of right lower  extremity   . Hypokalemia 11/02/2016  . QT prolongation   . Gangrene of right foot ([place])   . Pressure injury of skin 10/24/2016  . Cellulitis of great toe of right foot 10/23/2016  . Leukocytosis 10/23/2016  . Hyperkalemia 10/23/2016  . Abdominal hematoma 11/15/2015  . HCAP (healthcare-associated pneumonia) 11/15/2015  . Acute blood loss anemia 11/15/2015  . Absolute anemia   . Right lower quadrant pain   . Pneumonia involving right lung 11/04/2015  . HTN (hypertension) 11/04/2015  . Noninfectious gastroenteritis and colitis 11/04/2015  . Gastroenteritis 11/04/2015  . CVA (cerebral infarction) 06/19/2015  . End stage renal disease on dialysis ([place]) 06/19/2015  . Lacunar infarct, acute ([place]) 06/19/2015  . GERD (gastroesophageal reflux disease) 06/19/2015  . Anxiety and depression 06/19/2015  . Hypertension, well controlled 06/19/2015  . End stage renal disease ([place]) 01/21/2013  . Acute coronary syndrome ([place]) 09/22/2012  . History of partial Right nephrectomy for renal mass (2008) 09/22/2012  . Left ventricular hypertrophy (moderate) per ECHO 2010 09/22/2012  . Chronic systolic congestive heart failure/EF 40% PER echo 2010 09/22/2012  . 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 Acute kidney injury ([place]) 09/21/2012  . Chest pain 09/21/2012   Past Medical History:  Diagnosis Date  . Anemia March 2014  . Cancer ([place])    Kidney  . CHF (congestive heart failure) ([place]) 0102   Acute systolic and diastolic CHF  . Depression    & rage --  was in counseling....great now  . Diabetes mellitus    NO DM SINCE LOST 130LBS  . ESRD on peritoneal dialysis ([place]) 2018   Started in-center HD approx 2015 for 2 years, then did about 1 year of home HD and then started peritoneal dialysis in early 2018.    Marland Kitchen History of kidney cancer   . Hyperlipidemia   . Hypertension   . Obesity   . PONV (postoperative nausea and vomiting)   . Stroke [facility])     Family History  Problem Relation Age of  Onset  . Heart disease Mother        Heart Disease before age 59  . Deep vein thrombosis Father   . Heart attack Father   . Other Other     Past Surgical History:  Procedure Laterality Date  . ABDOMINAL AORTOGRAM W/LOWER EXTREMITY N/A 10/28/2016   Procedure: Abdominal Aortogram w/Lower Extremity;  Surgeon: Angelia Mould, MD;  Location: [facility];  Service: Cardiovascular;  Laterality: N/A;  . AMPUTATION Right 11/05/2016   Procedure: AMPUTATION RIGHT FIRST RAY;  Surgeon: Conrad [place]avia, MD;  Location: [facility] OR;  Service: Vascular;  Laterality: Right;  . AV FISTULA PLACEMENT Left 01/26/2013   Procedure: ARTERIOVENOUS (AV) FISTULA CREATION - LEFT RADIAL CEPHALIC AVF;  Surgeon: Angelia Mould, MD;  Location: [place];  Service: Vascular;  Laterality: Left;  . CHOLECYSTECTOMY  11/06/2015   Procedure: LAPAROSCOPIC CHOLECYSTECTOMY;  Surgeon: Ralene Ok, MD;  Location: [facility] [facility];  Service:  General;;  . FEMORAL-POPLITEAL BYPASS GRAFT Right 11/05/2016   Procedure: BYPASS GRAFT FEMORAL-POPLITEAL ARTERY USING NON-REVERSED RIGHT GREATER SAPPHENOUS VEIN;  Surgeon: Conrad [place], MD;  Location: [place];  Service: Vascular;  Laterality: Right;  . HERNIA REPAIR    . LOWER EXTREMITY ANGIOGRAM Right 10/30/2016   Procedure: Right  LOWER EXTREMITY ANGIOGRAM WITH RIGHT SUPERFICIAL FEMORAL ARTERY balloon angioplasty;  Surgeon: Conrad [place], MD;  Location: [place];  Service: Vascular;  Laterality: Right;  . NEPHRECTOMY Right 2008   partial  . TESTICLE TORSION REDUCTION    . TONSILLECTOMY AND ADENOIDECTOMY    . VEIN HARVEST Right 11/05/2016   Procedure: RIGHT GREATER SAPPHENOUS VEIN HARVEST;  Surgeon: Conrad Muddy, MD;  Location: [place];  Service: Vascular;  Laterality: Right;   Social History   Occupational History  . [employer]    Social History Main Topics  . Smoking status: Former Smoker    Packs/day: 0.25    Years: 10.00    Types: Cigarettes  . Smokeless tobacco: Never Used     Comment: pt  reports that doesn't smoke tobacco  . Alcohol use No     Comment: "6 pack occ."-- lasts him six months-STOPPED 2 YRS AGO  . Drug use: Yes    Types: Marijuana     Comment: just in high school   . Sexual activity: Yes    Birth control/ protection: None

## 2016-12-02 NOTE — Addendum Note (Signed)
Addended by: Meridee Score on: 12/02/2016 05:20 PM   Modules accepted: Orders

## 2016-12-03 ENCOUNTER — Other Ambulatory Visit (INDEPENDENT_AMBULATORY_CARE_PROVIDER_SITE_OTHER): Payer: Self-pay | Admitting: Orthopedic Surgery

## 2016-12-03 ENCOUNTER — Encounter: Payer: Self-pay | Admitting: Family

## 2016-12-03 ENCOUNTER — Encounter (HOSPITAL_COMMUNITY): Payer: Self-pay | Admitting: *Deleted

## 2016-12-03 DIAGNOSIS — I70261 Atherosclerosis of native arteries of extremities with gangrene, right leg: Secondary | ICD-10-CM

## 2016-12-03 MED ORDER — CEFAZOLIN SODIUM-DEXTROSE 2-4 GM/100ML-% IV SOLN
2.0000 g | INTRAVENOUS | Status: AC
  Administered 2016-12-04: 2 g via INTRAVENOUS
  Filled 2016-12-03: qty 100

## 2016-12-03 NOTE — Progress Notes (Signed)
Anesthesia Chart Review:  Pt is a same day work up.  Patient is a 46 year old male scheduled for R above-knee amputation on 12/04/2016 with Meridee Score, M.D.  [facility] includes: CHF, moderate aortic stenosis, HTN, DM (resolved after weight loss), stroke (06/2015), ESRD on peritoneal dialysis, post-op N/V. Current smoker. BMI 27.5. S/p cholecystectomy 11/06/15.   - Hospitalized 4/25-5/23/18 for cellulitis of R great toe, hypertensive urgency, acute delirium, metabolic encephalopathy (thought to be due to alcohol/medication withdrawal), peripheral vascular disease, anemia. S/p amputation of R great toe and R FPBG 10/30/16. Pt saw Sanda Klein, MD with cardiology while inpatient and cleared for surgeries at intermediate risk.   Medications include: Amlodipine, ASA 81 mg, clonidine patch, Depakote, iron, hydralazine, Isordil, labetalol, Prilosec, pravastatin.  Labs will be obtained DOS.   1 view CXR 11/06/16: Low lung volumes without focal lung disease.  EKG 11/17/16: Sinus rhythm with 1st degree A-V block. Possible LA enlargement. LAD. LVH with QRS widening and repolarization abnormality  Echo 11/01/16:  - Left ventricle: The cavity size was normal. There was severe concentric hypertrophy. Systolic function was normal. The estimated ejection fraction was in the range of 60% to 65%. Wall motion was normal; there were no regional wall motion abnormalities. Doppler parameters are consistent with pseudonormal left ventricular relaxation (grade 2 diastolic dysfunction). The E/e&' ratio is >30 with medial e&' velocity <6, suggesting elevated LV filling pressure. - Aortic valve: Heavily calcified trileaflet aortic valve. Moderate stenosis. Mean gradient (S): 16 mm Hg. Peak gradient (S): 29 mm Hg. Valve area (VTI): 1.41 cm^2. Valve area (Vmax): 1.16 cm^2. - Mitral valve: Moderate posterior MAC. Mildly thickened leaflets. Mild stenosis. Mean gradient (D): 8 mm Hg. Valve area by pressure half-time: 1.9 cm^2. - Left  atrium: The atrium was moderately dilated. - Right ventricle: The cavity size was normal. The moderator band was prominent. Systolic function was normal. - Right atrium: The atrium was mildly dilated. - Tricuspid valve: There was mild regurgitation. - Pulmonary arteries: PA peak pressure: 35 mm Hg (S). - Inferior vena cava: The vessel was normal in size. The respirophasic diameter changes were in the normal range (>= 50%), consistent with normal central venous pressure. - Impressions: Compared to a prior study in 2016, there is now severe LVH, EF remains normal at 60-65%, grade 2 DD with high LV filling pressure. There is now moderate LAE, mild mitral stenosis and mild pulmonary hypertension with RVSP of 35 mmHg.  Nuclear stress test 10/31/16:  There was no ST segment deviation noted during stress.  Findings consistent with ischemia.  This is an intermediate risk study.  The left ventricular ejection fraction is mildly decreased (45-54%). 1. EF 43%, inferior hypokinesis.  2. Reversible, medium sized, mild basal to mid inferior and basal inferolateral perfusion defect.  This is concerning for possible ischemia.  3. Intermediate risk study.   If labs acceptable DOS, I anticipate patient can proceed as scheduled.  Willeen Cass, FNP-BC [facility] Short Stay Surgical [facility]/Anesthesiology Phone: [phone] 12/03/2016 4:32 PM

## 2016-12-03 NOTE — Progress Notes (Addendum)
SDW-pre-op call completed by pt spouse, Joelene Millin, with pt verbal consent. Spouse denies that pt C/O SOB and chest pain. Pt was seen by Dr. Sallyanne Kuster, Cardiology. Spouse made aware for pt to stop taking vitamins, fish oil and herbal medications. Do not take any NSAIDs ie: Ibuprofen, Advil, Naproxen BC and Goody Powder. Spouse verbalized understanding of all pre-op instructions.

## 2016-12-04 ENCOUNTER — Encounter (HOSPITAL_COMMUNITY)
Admission: RE | Disposition: A | Discharge status: Rehabilitation Facility | Payer: Self-pay | Source: Ambulatory Visit | Attending: Orthopedic Surgery

## 2016-12-04 ENCOUNTER — Inpatient Hospital Stay (HOSPITAL_COMMUNITY): Payer: Medicare Other | Admitting: Emergency Medicine

## 2016-12-04 ENCOUNTER — Inpatient Hospital Stay (HOSPITAL_COMMUNITY)
Admission: RE | Admit: 2016-12-04 | Discharge: 2016-12-09 | DRG: 239 | Disposition: A | Discharge status: Rehabilitation Facility | Payer: Medicare Other | Source: Ambulatory Visit | Attending: Orthopedic Surgery | Admitting: Orthopedic Surgery

## 2016-12-04 ENCOUNTER — Telehealth: Payer: Self-pay | Admitting: Family

## 2016-12-04 ENCOUNTER — Encounter (HOSPITAL_COMMUNITY): Payer: Self-pay | Admitting: *Deleted

## 2016-12-04 DIAGNOSIS — E1142 Type 2 diabetes mellitus with diabetic polyneuropathy: Secondary | ICD-10-CM | POA: Diagnosis not present

## 2016-12-04 DIAGNOSIS — I150 Renovascular hypertension: Secondary | ICD-10-CM | POA: Diagnosis not present

## 2016-12-04 DIAGNOSIS — D62 Acute posthemorrhagic anemia: Secondary | ICD-10-CM | POA: Diagnosis not present

## 2016-12-04 DIAGNOSIS — E669 Obesity, unspecified: Secondary | ICD-10-CM | POA: Diagnosis present

## 2016-12-04 DIAGNOSIS — E43 Unspecified severe protein-calorie malnutrition: Secondary | ICD-10-CM | POA: Diagnosis present

## 2016-12-04 DIAGNOSIS — Z9049 Acquired absence of other specified parts of digestive tract: Secondary | ICD-10-CM | POA: Diagnosis not present

## 2016-12-04 DIAGNOSIS — F17210 Nicotine dependence, cigarettes, uncomplicated: Secondary | ICD-10-CM | POA: Diagnosis present

## 2016-12-04 DIAGNOSIS — I350 Nonrheumatic aortic (valve) stenosis: Secondary | ICD-10-CM | POA: Diagnosis present

## 2016-12-04 DIAGNOSIS — Z905 Acquired absence of kidney: Secondary | ICD-10-CM

## 2016-12-04 DIAGNOSIS — L97529 Non-pressure chronic ulcer of other part of left foot with unspecified severity: Secondary | ICD-10-CM | POA: Diagnosis present

## 2016-12-04 DIAGNOSIS — I132 Hypertensive heart and chronic kidney disease with heart failure and with stage 5 chronic kidney disease, or end stage renal disease: Secondary | ICD-10-CM | POA: Diagnosis present

## 2016-12-04 DIAGNOSIS — G8929 Other chronic pain: Secondary | ICD-10-CM | POA: Diagnosis present

## 2016-12-04 DIAGNOSIS — Z79899 Other long term (current) drug therapy: Secondary | ICD-10-CM

## 2016-12-04 DIAGNOSIS — I272 Pulmonary hypertension, unspecified: Secondary | ICD-10-CM | POA: Diagnosis present

## 2016-12-04 DIAGNOSIS — Z6826 Body mass index (BMI) 26.0-26.9, adult: Secondary | ICD-10-CM

## 2016-12-04 DIAGNOSIS — G9340 Encephalopathy, unspecified: Secondary | ICD-10-CM | POA: Diagnosis not present

## 2016-12-04 DIAGNOSIS — Z85528 Personal history of other malignant neoplasm of kidney: Secondary | ICD-10-CM

## 2016-12-04 DIAGNOSIS — E1152 Type 2 diabetes mellitus with diabetic peripheral angiopathy with gangrene: Principal | ICD-10-CM | POA: Diagnosis present

## 2016-12-04 DIAGNOSIS — D638 Anemia in other chronic diseases classified elsewhere: Secondary | ICD-10-CM | POA: Diagnosis present

## 2016-12-04 DIAGNOSIS — I050 Rheumatic mitral stenosis: Secondary | ICD-10-CM | POA: Diagnosis present

## 2016-12-04 DIAGNOSIS — D72829 Elevated white blood cell count, unspecified: Secondary | ICD-10-CM | POA: Diagnosis not present

## 2016-12-04 DIAGNOSIS — Z720 Tobacco use: Secondary | ICD-10-CM | POA: Diagnosis not present

## 2016-12-04 DIAGNOSIS — E785 Hyperlipidemia, unspecified: Secondary | ICD-10-CM | POA: Diagnosis present

## 2016-12-04 DIAGNOSIS — N2581 Secondary hyperparathyroidism of renal origin: Secondary | ICD-10-CM | POA: Diagnosis present

## 2016-12-04 DIAGNOSIS — I169 Hypertensive crisis, unspecified: Secondary | ICD-10-CM | POA: Diagnosis not present

## 2016-12-04 DIAGNOSIS — R5381 Other malaise: Secondary | ICD-10-CM | POA: Diagnosis present

## 2016-12-04 DIAGNOSIS — G8918 Other acute postprocedural pain: Secondary | ICD-10-CM | POA: Diagnosis not present

## 2016-12-04 DIAGNOSIS — I70261 Atherosclerosis of native arteries of extremities with gangrene, right leg: Secondary | ICD-10-CM | POA: Diagnosis present

## 2016-12-04 DIAGNOSIS — N186 End stage renal disease: Secondary | ICD-10-CM | POA: Diagnosis present

## 2016-12-04 DIAGNOSIS — E11621 Type 2 diabetes mellitus with foot ulcer: Secondary | ICD-10-CM | POA: Diagnosis present

## 2016-12-04 DIAGNOSIS — E1122 Type 2 diabetes mellitus with diabetic chronic kidney disease: Secondary | ICD-10-CM | POA: Diagnosis present

## 2016-12-04 DIAGNOSIS — F329 Major depressive disorder, single episode, unspecified: Secondary | ICD-10-CM | POA: Diagnosis present

## 2016-12-04 DIAGNOSIS — I5042 Chronic combined systolic (congestive) and diastolic (congestive) heart failure: Secondary | ICD-10-CM | POA: Diagnosis present

## 2016-12-04 DIAGNOSIS — Z89611 Acquired absence of right leg above knee: Secondary | ICD-10-CM | POA: Diagnosis not present

## 2016-12-04 DIAGNOSIS — S78111A Complete traumatic amputation at level between right hip and knee, initial encounter: Secondary | ICD-10-CM

## 2016-12-04 DIAGNOSIS — I10 Essential (primary) hypertension: Secondary | ICD-10-CM | POA: Diagnosis not present

## 2016-12-04 DIAGNOSIS — F05 Delirium due to known physiological condition: Secondary | ICD-10-CM | POA: Diagnosis present

## 2016-12-04 DIAGNOSIS — M792 Neuralgia and neuritis, unspecified: Secondary | ICD-10-CM | POA: Diagnosis not present

## 2016-12-04 DIAGNOSIS — I110 Hypertensive heart disease with heart failure: Secondary | ICD-10-CM

## 2016-12-04 DIAGNOSIS — Z992 Dependence on renal dialysis: Secondary | ICD-10-CM

## 2016-12-04 DIAGNOSIS — I160 Hypertensive urgency: Secondary | ICD-10-CM | POA: Diagnosis not present

## 2016-12-04 DIAGNOSIS — Z8673 Personal history of transient ischemic attack (TIA), and cerebral infarction without residual deficits: Secondary | ICD-10-CM | POA: Diagnosis not present

## 2016-12-04 DIAGNOSIS — I5032 Chronic diastolic (congestive) heart failure: Secondary | ICD-10-CM

## 2016-12-04 DIAGNOSIS — E8889 Other specified metabolic disorders: Secondary | ICD-10-CM | POA: Diagnosis present

## 2016-12-04 DIAGNOSIS — E1151 Type 2 diabetes mellitus with diabetic peripheral angiopathy without gangrene: Secondary | ICD-10-CM | POA: Diagnosis not present

## 2016-12-04 DIAGNOSIS — Z7982 Long term (current) use of aspirin: Secondary | ICD-10-CM

## 2016-12-04 DIAGNOSIS — E119 Type 2 diabetes mellitus without complications: Secondary | ICD-10-CM | POA: Diagnosis not present

## 2016-12-04 DIAGNOSIS — G546 Phantom limb syndrome with pain: Secondary | ICD-10-CM | POA: Diagnosis not present

## 2016-12-04 DIAGNOSIS — R7303 Prediabetes: Secondary | ICD-10-CM | POA: Diagnosis not present

## 2016-12-04 DIAGNOSIS — Z4781 Encounter for orthopedic aftercare following surgical amputation: Secondary | ICD-10-CM | POA: Diagnosis present

## 2016-12-04 HISTORY — PX: AMPUTATION: SHX166

## 2016-12-04 HISTORY — DX: Gangrene, not elsewhere classified: I96

## 2016-12-04 LAB — CBC
HCT: 26.1 % — ABNORMAL LOW (ref 39.0–52.0)
HEMOGLOBIN: 8.1 g/dL — AB (ref 13.0–17.0)
MCH: 25.6 pg — ABNORMAL LOW (ref 26.0–34.0)
MCHC: 31 g/dL (ref 30.0–36.0)
MCV: 82.3 fL (ref 78.0–100.0)
PLATELETS: 640 10*3/uL — AB (ref 150–400)
RBC: 3.17 MIL/uL — AB (ref 4.22–5.81)
RDW: 20.3 % — ABNORMAL HIGH (ref 11.5–15.5)
WBC: 12.2 10*3/uL — AB (ref 4.0–10.5)

## 2016-12-04 LAB — COMPREHENSIVE METABOLIC PANEL
ALT: 23 U/L (ref 17–63)
AST: 21 U/L (ref 15–41)
Albumin: 1.9 g/dL — ABNORMAL LOW (ref 3.5–5.0)
Alkaline Phosphatase: 234 U/L — ABNORMAL HIGH (ref 38–126)
Anion gap: 21 — ABNORMAL HIGH (ref 5–15)
BUN: 126 mg/dL — AB (ref 6–20)
CHLORIDE: 94 mmol/L — AB (ref 101–111)
CO2: 19 mmol/L — AB (ref 22–32)
CREATININE: 12.02 mg/dL — AB (ref 0.61–1.24)
Calcium: 7.4 mg/dL — ABNORMAL LOW (ref 8.9–10.3)
GFR calc Af Amer: 5 mL/min — ABNORMAL LOW (ref >60)
GFR calc non Af Amer: 4 mL/min — ABNORMAL LOW (ref >60)
Glucose, Bld: 91 mg/dL (ref 65–99)
Potassium: 4.6 mmol/L (ref 3.5–5.1)
SODIUM: 134 mmol/L — AB (ref 135–145)
Total Bilirubin: 0.7 mg/dL (ref 0.3–1.2)
Total Protein: 5.6 g/dL — ABNORMAL LOW (ref 6.5–8.1)

## 2016-12-04 LAB — GLUCOSE, CAPILLARY
GLUCOSE-CAPILLARY: 98 mg/dL (ref 65–99)
GLUCOSE-CAPILLARY: 86 mg/dL (ref 65–99)
GLUCOSE-CAPILLARY: 115 mg/dL — AB (ref 65–99)
Glucose-Capillary: 99 mg/dL (ref 65–99)

## 2016-12-04 LAB — PROTIME-INR
INR: 1.15
Prothrombin Time: 14.8 seconds (ref 11.4–15.2)

## 2016-12-04 LAB — APTT: aPTT: 34 seconds (ref 24–36)

## 2016-12-04 SURGERY — AMPUTATION, ABOVE KNEE
Anesthesia: General | Laterality: Right

## 2016-12-04 MED ORDER — METHOCARBAMOL 500 MG PO TABS
500.0000 mg | ORAL_TABLET | Freq: Four times a day (QID) | ORAL | Status: DC | PRN
  Administered 2016-12-06 – 2016-12-08 (×3): 500 mg via ORAL
  Filled 2016-12-04 (×3): qty 1

## 2016-12-04 MED ORDER — HYDROCODONE-ACETAMINOPHEN 10-325 MG PO TABS
1.0000 | ORAL_TABLET | ORAL | Status: DC | PRN
  Administered 2016-12-04 – 2016-12-09 (×15): 2 via ORAL
  Filled 2016-12-04 (×15): qty 2

## 2016-12-04 MED ORDER — SODIUM CHLORIDE 0.9 % IV SOLN
INTRAVENOUS | Status: DC
  Administered 2016-12-04 (×2): via INTRAVENOUS

## 2016-12-04 MED ORDER — LABETALOL HCL 300 MG PO TABS
300.0000 mg | ORAL_TABLET | Freq: Two times a day (BID) | ORAL | Status: DC
  Administered 2016-12-04 – 2016-12-09 (×9): 300 mg via ORAL
  Filled 2016-12-04 (×11): qty 1

## 2016-12-04 MED ORDER — DIVALPROEX SODIUM 125 MG PO CSDR
500.0000 mg | DELAYED_RELEASE_CAPSULE | Freq: Two times a day (BID) | ORAL | Status: DC
Start: 2016-12-04 — End: 2016-12-09
  Administered 2016-12-04 – 2016-12-09 (×9): 500 mg via ORAL
  Filled 2016-12-04 (×11): qty 4

## 2016-12-04 MED ORDER — HEPARIN 1000 UNIT/ML FOR PERITONEAL DIALYSIS: 500.0000 [IU] | INTRAMUSCULAR | Status: DC | PRN

## 2016-12-04 MED ORDER — AMLODIPINE BESYLATE 10 MG PO TABS
10.0000 mg | ORAL_TABLET | Freq: Every day | ORAL | Status: DC
  Administered 2016-12-04 – 2016-12-09 (×5): 10 mg via ORAL
  Filled 2016-12-04 (×5): qty 1

## 2016-12-04 MED ORDER — FENTANYL CITRATE (PF) 250 MCG/5ML IJ SOLN
INTRAMUSCULAR | Status: AC
  Filled 2016-12-04: qty 5

## 2016-12-04 MED ORDER — HEPARIN 1000 UNIT/ML FOR PERITONEAL DIALYSIS
INTRAPERITONEAL | Status: DC | PRN
  Filled 2016-12-04 (×2): qty 5000

## 2016-12-04 MED ORDER — HYDRALAZINE HCL 50 MG PO TABS
100.0000 mg | ORAL_TABLET | Freq: Three times a day (TID) | ORAL | Status: DC
  Administered 2016-12-04 – 2016-12-09 (×13): 100 mg via ORAL
  Filled 2016-12-04 (×19): qty 2

## 2016-12-04 MED ORDER — CLONIDINE HCL 0.2 MG/24HR TD PTWK
0.2000 mg | MEDICATED_PATCH | TRANSDERMAL | Status: DC
  Filled 2016-12-04: qty 1

## 2016-12-04 MED ORDER — OXYCODONE HCL 5 MG/5ML PO SOLN: 5.0000 mg | Freq: Once | ORAL | Status: DC | PRN

## 2016-12-04 MED ORDER — SUCCINYLCHOLINE CHLORIDE 200 MG/10ML IV SOSY
PREFILLED_SYRINGE | INTRAVENOUS | Status: AC
  Filled 2016-12-04: qty 10

## 2016-12-04 MED ORDER — EPHEDRINE 5 MG/ML INJ
INTRAVENOUS | Status: AC
  Filled 2016-12-04: qty 10

## 2016-12-04 MED ORDER — ESCITALOPRAM OXALATE 10 MG PO TABS
10.0000 mg | ORAL_TABLET | Freq: Every day | ORAL | Status: DC
Start: 2016-12-04 — End: 2016-12-09
  Administered 2016-12-04 – 2016-12-09 (×5): 10 mg via ORAL
  Filled 2016-12-04 (×5): qty 1

## 2016-12-04 MED ORDER — LORAZEPAM 1 MG PO TABS
1.0000 mg | ORAL_TABLET | Freq: Four times a day (QID) | ORAL | Status: DC | PRN
  Administered 2016-12-04 – 2016-12-08 (×4): 1 mg via ORAL
  Filled 2016-12-04 (×5): qty 1

## 2016-12-04 MED ORDER — SODIUM CHLORIDE 0.9 % IV SOLN
INTRAVENOUS | Status: DC
  Administered 2016-12-04: 17:00:00 via INTRAVENOUS

## 2016-12-04 MED ORDER — CHLORHEXIDINE GLUCONATE 4 % EX LIQD: 60.0000 mL | Freq: Once | CUTANEOUS | Status: DC

## 2016-12-04 MED ORDER — LIDOCAINE HCL 1 % IJ SOLN
INTRAMUSCULAR | Status: DC | PRN
  Administered 2016-12-04: 80 mg via INTRADERMAL

## 2016-12-04 MED ORDER — ONDANSETRON HCL 4 MG/2ML IJ SOLN
INTRAMUSCULAR | Status: DC | PRN
  Administered 2016-12-04: 4 mg via INTRAVENOUS

## 2016-12-04 MED ORDER — METHOCARBAMOL 1000 MG/10ML IJ SOLN
500.0000 mg | Freq: Four times a day (QID) | INTRAVENOUS | Status: DC | PRN
  Filled 2016-12-04: qty 5

## 2016-12-04 MED ORDER — BISACODYL 10 MG RE SUPP: 10.0000 mg | Freq: Every day | RECTAL | Status: DC | PRN

## 2016-12-04 MED ORDER — CALCITRIOL 0.5 MCG PO CAPS
0.5000 ug | ORAL_CAPSULE | Freq: Every day | ORAL | Status: DC
  Administered 2016-12-04 – 2016-12-09 (×5): 0.5 ug via ORAL
  Filled 2016-12-04 (×5): qty 1

## 2016-12-04 MED ORDER — KETOROLAC TROMETHAMINE 15 MG/ML IJ SOLN
15.0000 mg | Freq: Four times a day (QID) | INTRAMUSCULAR | Status: AC
  Administered 2016-12-04 – 2016-12-05 (×3): 15 mg via INTRAVENOUS
  Filled 2016-12-04 (×4): qty 1

## 2016-12-04 MED ORDER — PROPOFOL 10 MG/ML IV BOLUS
INTRAVENOUS | Status: DC | PRN
  Administered 2016-12-04: 160 mg via INTRAVENOUS

## 2016-12-04 MED ORDER — PRAVASTATIN SODIUM 20 MG PO TABS
20.0000 mg | ORAL_TABLET | Freq: Every day | ORAL | Status: DC
  Administered 2016-12-04 – 2016-12-08 (×5): 20 mg via ORAL
  Filled 2016-12-04 (×5): qty 1

## 2016-12-04 MED ORDER — SUCCINYLCHOLINE CHLORIDE 20 MG/ML IJ SOLN
INTRAMUSCULAR | Status: DC | PRN
  Administered 2016-12-04: 120 mg via INTRAVENOUS

## 2016-12-04 MED ORDER — FERRIC CITRATE 1 GM 210 MG(FE) PO TABS
420.0000 mg | ORAL_TABLET | Freq: Three times a day (TID) | ORAL | Status: DC
  Administered 2016-12-04 – 2016-12-09 (×11): 420 mg via ORAL
  Filled 2016-12-04 (×17): qty 2

## 2016-12-04 MED ORDER — FENTANYL CITRATE (PF) 100 MCG/2ML IJ SOLN
25.0000 ug | INTRAMUSCULAR | Status: DC | PRN
  Administered 2016-12-04: 50 ug via INTRAVENOUS

## 2016-12-04 MED ORDER — LABETALOL HCL 300 MG PO TABS
300.0000 mg | ORAL_TABLET | Freq: Once | ORAL | Status: AC
  Administered 2016-12-04: 300 mg via ORAL
  Filled 2016-12-04 (×2): qty 1

## 2016-12-04 MED ORDER — ASPIRIN EC 325 MG PO TBEC
325.0000 mg | DELAYED_RELEASE_TABLET | Freq: Every day | ORAL | Status: DC
  Administered 2016-12-04 – 2016-12-09 (×5): 325 mg via ORAL
  Filled 2016-12-04 (×5): qty 1

## 2016-12-04 MED ORDER — GENTAMICIN SULFATE 0.1 % EX CREA
1.0000 "application " | TOPICAL_CREAM | Freq: Every day | CUTANEOUS | Status: DC | PRN
  Administered 2016-12-05: 1 via TOPICAL
  Filled 2016-12-04 (×2): qty 15

## 2016-12-04 MED ORDER — FENTANYL CITRATE (PF) 250 MCG/5ML IJ SOLN
INTRAMUSCULAR | Status: DC | PRN
  Administered 2016-12-04: 100 ug via INTRAVENOUS

## 2016-12-04 MED ORDER — CINACALCET HCL 30 MG PO TABS
60.0000 mg | ORAL_TABLET | Freq: Every day | ORAL | Status: DC
  Administered 2016-12-04 – 2016-12-08 (×5): 60 mg via ORAL
  Filled 2016-12-04 (×5): qty 2

## 2016-12-04 MED ORDER — MAGNESIUM CITRATE PO SOLN
1.0000 | Freq: Once | ORAL | Status: AC | PRN
  Administered 2016-12-08: 1 via ORAL
  Filled 2016-12-04: qty 296

## 2016-12-04 MED ORDER — MIDAZOLAM HCL 2 MG/2ML IJ SOLN
INTRAMUSCULAR | Status: AC
  Filled 2016-12-04: qty 2

## 2016-12-04 MED ORDER — METOCLOPRAMIDE HCL 5 MG PO TABS: 5.0000 mg | ORAL_TABLET | Freq: Three times a day (TID) | ORAL | Status: DC | PRN

## 2016-12-04 MED ORDER — ONDANSETRON HCL 4 MG/2ML IJ SOLN
INTRAMUSCULAR | Status: AC
Start: 2016-12-04 — End: ?
  Filled 2016-12-04: qty 2

## 2016-12-04 MED ORDER — POLYETHYLENE GLYCOL 3350 17 G PO PACK: 17.0000 g | PACK | Freq: Every day | ORAL | Status: DC | PRN

## 2016-12-04 MED ORDER — CEFAZOLIN SODIUM-DEXTROSE 1-4 GM/50ML-% IV SOLN: 1.0000 g | Freq: Four times a day (QID) | INTRAVENOUS | Status: DC

## 2016-12-04 MED ORDER — GENTAMICIN SULFATE 0.1 % EX CREA
1.0000 "application " | TOPICAL_CREAM | Freq: Every day | CUTANEOUS | Status: DC
  Administered 2016-12-06 – 2016-12-08 (×3): 1 via TOPICAL
  Filled 2016-12-04: qty 15

## 2016-12-04 MED ORDER — ACETAMINOPHEN 650 MG RE SUPP: 650.0000 mg | Freq: Four times a day (QID) | RECTAL | Status: DC | PRN

## 2016-12-04 MED ORDER — DARBEPOETIN ALFA 200 MCG/0.4ML IJ SOSY
200.0000 ug | PREFILLED_SYRINGE | INTRAMUSCULAR | Status: DC
  Administered 2016-12-05: 200 ug via SUBCUTANEOUS
  Filled 2016-12-04: qty 0.4

## 2016-12-04 MED ORDER — GABAPENTIN 100 MG PO CAPS
100.0000 mg | ORAL_CAPSULE | Freq: Three times a day (TID) | ORAL | Status: DC
  Administered 2016-12-04 – 2016-12-09 (×13): 100 mg via ORAL
  Filled 2016-12-04 (×13): qty 1

## 2016-12-04 MED ORDER — OXYCODONE HCL 5 MG PO TABS
5.0000 mg | ORAL_TABLET | Freq: Once | ORAL | Status: DC | PRN
Start: 2016-12-04 — End: 2016-12-04

## 2016-12-04 MED ORDER — ONDANSETRON HCL 4 MG PO TABS: 4.0000 mg | ORAL_TABLET | Freq: Four times a day (QID) | ORAL | Status: DC | PRN

## 2016-12-04 MED ORDER — LIDOCAINE 2% (20 MG/ML) 5 ML SYRINGE
INTRAMUSCULAR | Status: AC
  Filled 2016-12-04: qty 5

## 2016-12-04 MED ORDER — DOCUSATE SODIUM 100 MG PO CAPS
100.0000 mg | ORAL_CAPSULE | Freq: Two times a day (BID) | ORAL | Status: DC
  Administered 2016-12-04 – 2016-12-07 (×7): 100 mg via ORAL
  Filled 2016-12-04 (×9): qty 1

## 2016-12-04 MED ORDER — FENTANYL CITRATE (PF) 100 MCG/2ML IJ SOLN
INTRAMUSCULAR | Status: AC
  Administered 2016-12-04: 50 ug via INTRAVENOUS
  Filled 2016-12-04: qty 2

## 2016-12-04 MED ORDER — PANTOPRAZOLE SODIUM 40 MG PO TBEC
40.0000 mg | DELAYED_RELEASE_TABLET | Freq: Every day | ORAL | Status: DC
  Administered 2016-12-04 – 2016-12-09 (×5): 40 mg via ORAL
  Filled 2016-12-04 (×5): qty 1

## 2016-12-04 MED ORDER — SODIUM CHLORIDE 0.9 % IV SOLN
INTRAVENOUS | Status: DC | PRN
  Administered 2016-12-04: 120 ug via INTRAVENOUS
  Administered 2016-12-04: 80 ug via INTRAVENOUS
  Administered 2016-12-04: 120 ug via INTRAVENOUS

## 2016-12-04 MED ORDER — CEFAZOLIN SODIUM-DEXTROSE 1-4 GM/50ML-% IV SOLN
1.0000 g | Freq: Three times a day (TID) | INTRAVENOUS | Status: DC
  Administered 2016-12-04: 1 g via INTRAVENOUS
  Filled 2016-12-04 (×2): qty 50

## 2016-12-04 MED ORDER — ONDANSETRON HCL 4 MG/2ML IJ SOLN: 4.0000 mg | Freq: Four times a day (QID) | INTRAMUSCULAR | Status: DC | PRN

## 2016-12-04 MED ORDER — ROCURONIUM BROMIDE 10 MG/ML (PF) SYRINGE
PREFILLED_SYRINGE | INTRAVENOUS | Status: AC
  Filled 2016-12-04: qty 5

## 2016-12-04 MED ORDER — METOCLOPRAMIDE HCL 5 MG/ML IJ SOLN: 5.0000 mg | Freq: Three times a day (TID) | INTRAMUSCULAR | Status: DC | PRN

## 2016-12-04 MED ORDER — PHENYLEPHRINE 40 MCG/ML (10ML) SYRINGE FOR IV PUSH (FOR BLOOD PRESSURE SUPPORT)
PREFILLED_SYRINGE | INTRAVENOUS | Status: AC
  Filled 2016-12-04: qty 10

## 2016-12-04 MED ORDER — 0.9 % SODIUM CHLORIDE (POUR BTL) OPTIME
TOPICAL | Status: DC | PRN
  Administered 2016-12-04: 1000 mL

## 2016-12-04 MED ORDER — ISOSORBIDE DINITRATE 5 MG PO TABS
5.0000 mg | ORAL_TABLET | Freq: Two times a day (BID) | ORAL | Status: DC
  Administered 2016-12-04 – 2016-12-09 (×9): 5 mg via ORAL
  Filled 2016-12-04 (×11): qty 1

## 2016-12-04 MED ORDER — ACETAMINOPHEN 325 MG PO TABS: 650.0000 mg | ORAL_TABLET | Freq: Four times a day (QID) | ORAL | Status: DC | PRN

## 2016-12-04 SURGICAL SUPPLY — 43 items
BLADE SAW RECIP 87.9 MT (BLADE) ×3 IMPLANT
BNDG COHESIVE 6X5 TAN STRL LF (GAUZE/BANDAGES/DRESSINGS) ×4 IMPLANT
BNDG GAUZE ELAST 4 BULKY (GAUZE/BANDAGES/DRESSINGS) ×1 IMPLANT
COVER SURGICAL LIGHT HANDLE (MISCELLANEOUS) ×4 IMPLANT
CUFF TOURNIQUET SINGLE 34IN LL (TOURNIQUET CUFF) IMPLANT
CUFF TOURNIQUET SINGLE 44IN (TOURNIQUET CUFF) IMPLANT
DRAIN PENROSE 1/2X12 LTX STRL (WOUND CARE) IMPLANT
DRAPE HALF SHEET 40X57 (DRAPES) ×4 IMPLANT
DRAPE U-SHAPE 47X51 STRL (DRAPES) ×6 IMPLANT
DRSG ADAPTIC 3X8 NADH LF (GAUZE/BANDAGES/DRESSINGS) ×1 IMPLANT
DRSG PAD ABDOMINAL 8X10 ST (GAUZE/BANDAGES/DRESSINGS) ×2 IMPLANT
DRSG VAC ATS LRG SENSATRAC (GAUZE/BANDAGES/DRESSINGS) ×2 IMPLANT
DURAPREP 26ML APPLICATOR (WOUND CARE) ×3 IMPLANT
ELECT CAUTERY BLADE 6.4 (BLADE) ×2 IMPLANT
ELECT REM PT RETURN 9FT ADLT (ELECTROSURGICAL) ×3
ELECTRODE REM PT RTRN 9FT ADLT (ELECTROSURGICAL) ×1 IMPLANT
EVACUATOR 1/8 PVC DRAIN (DRAIN) IMPLANT
GAUZE SPONGE 4X4 12PLY STRL (GAUZE/BANDAGES/DRESSINGS) ×1 IMPLANT
GLOVE BIOGEL PI IND STRL 9 (GLOVE) ×1 IMPLANT
GLOVE BIOGEL PI INDICATOR 9 (GLOVE) ×4
GLOVE SURG ORTHO 9.0 STRL STRW (GLOVE) ×3 IMPLANT
GOWN STRL REUS W/ TWL XL LVL3 (GOWN DISPOSABLE) ×2 IMPLANT
GOWN STRL REUS W/TWL XL LVL3 (GOWN DISPOSABLE) ×9
KIT BASIN OR (CUSTOM PROCEDURE TRAY) ×3 IMPLANT
KIT DRSG PREVENA PLUS 7DAY 125 (MISCELLANEOUS) ×2 IMPLANT
KIT ROOM TURNOVER OR (KITS) ×3 IMPLANT
MANIFOLD NEPTUNE II (INSTRUMENTS) ×3 IMPLANT
NS IRRIG 1000ML POUR BTL (IV SOLUTION) ×3 IMPLANT
PACK GENERAL/GYN (CUSTOM PROCEDURE TRAY) ×3 IMPLANT
PAD ARMBOARD 7.5X6 YLW CONV (MISCELLANEOUS) ×3 IMPLANT
STAPLER VISISTAT 35W (STAPLE) ×2 IMPLANT
STOCKINETTE IMPERVIOUS LG (DRAPES) ×2 IMPLANT
SUT ETHILON 2 0 PSLX (SUTURE) ×5 IMPLANT
SUT PDS AB 1 CT  36 (SUTURE)
SUT PDS AB 1 CT 36 (SUTURE) IMPLANT
SUT SILK 2 0 (SUTURE) ×3
SUT SILK 2-0 18XBRD TIE 12 (SUTURE) ×1 IMPLANT
SUT VIC AB 1 CTX 27 (SUTURE) ×3 IMPLANT
SWAB COLLECTION DEVICE MRSA (MISCELLANEOUS) IMPLANT
SWAB CULTURE ESWAB REG 1ML (MISCELLANEOUS) IMPLANT
TOWEL OR 17X24 6PK STRL BLUE (TOWEL DISPOSABLE) ×1 IMPLANT
TOWEL OR 17X26 10 PK STRL BLUE (TOWEL DISPOSABLE) ×3 IMPLANT
WATER STERILE IRR 1000ML POUR (IV SOLUTION) ×1 IMPLANT

## 2016-12-04 NOTE — Consult Note (Signed)
[facility] Renal Consultation Note  Indication for Consultation:  Management of ESRD/hemodialysis; anemia, hypertension/volume and secondary hyperparathyroidism  HPI: Hayden [name] is a 46 y.o. male. with a h/o  ESRD 2/2 DM  ,CVa, HTN , followed by Dr. Moshe Cipro switched to PD 3 mths ago; he is on  CCPD with 5 exchanges w/ usually 2.5% 3L fills and the 5th fill he manually drains 1-1/2 hours later. He is status post  Dr. Bridgett Larsson revascularization R  lower extrem  11/05/16 with  amputation R 1st toe with rehab stay to 11/28/16  /he presents at this time with R gangrenous wound of Leg and foot and now sp R  Above knee amputation by Dr. Sharol Given . Per wife in room post op  Gives ho  significant  pain in R leg/foot and mild confusion  Requiring pain meds  Noted last admit AMS/delerium - psychosis w agitation - extensive psych history per wife - resolved and back at baseline  At dc      Now in room post op R AKA post op confusion but following commands slightly lethargic but just recently brought back from PACU.      Past Medical History:  Diagnosis Date  . Anemia March 2014  . Cancer ([place])    Kidney  . CHF (congestive heart failure) ([place]) 1610   Acute systolic and diastolic CHF  . Depression    & rage --  was in counseling....great now  . Diabetes mellitus    NO DM SINCE LOST 130LBS  . ESRD on peritoneal dialysis ([place]) 2018   Started in-center HD approx 2015 for 2 years, then did about 1 year of home HD and then started peritoneal dialysis in early 2018.    . Gangrene ([place])    right leg and foot  . History of kidney cancer   . Hyperlipidemia   . Hypertension   . Obesity   . PONV (postoperative nausea and vomiting)   . Stroke [facility])     Past Surgical History:  Procedure Laterality Date  . ABDOMINAL AORTOGRAM W/LOWER EXTREMITY N/A 10/28/2016   Procedure: Abdominal Aortogram w/Lower Extremity;  Surgeon: Angelia Mould, MD;  Location: [place] CV LAB;  Service:  Cardiovascular;  Laterality: N/A;  . AMPUTATION Right 11/05/2016   Procedure: AMPUTATION RIGHT FIRST RAY;  Surgeon: Conrad [place], MD;  Location: [facility] OR;  Service: Vascular;  Laterality: Right;  . AV FISTULA PLACEMENT Left 01/26/2013   Procedure: ARTERIOVENOUS (AV) FISTULA CREATION - LEFT RADIAL CEPHALIC AVF;  Surgeon: Angelia Mould, MD;  Location: [place];  Service: Vascular;  Laterality: Left;  . CHOLECYSTECTOMY  11/06/2015   Procedure: LAPAROSCOPIC CHOLECYSTECTOMY;  Surgeon: Ralene Ok, MD;  Location: [place];  Service: General;;  . FEMORAL-POPLITEAL BYPASS GRAFT Right 11/05/2016   Procedure: BYPASS GRAFT FEMORAL-POPLITEAL ARTERY USING NON-REVERSED RIGHT GREATER SAPPHENOUS VEIN;  Surgeon: Conrad Waverly, MD;  Location: [place];  Service: Vascular;  Laterality: Right;  . HERNIA REPAIR    . LOWER EXTREMITY ANGIOGRAM Right 10/30/2016   Procedure: Right  LOWER EXTREMITY ANGIOGRAM WITH RIGHT SUPERFICIAL FEMORAL ARTERY balloon angioplasty;  Surgeon: Conrad [place], MD;  Location: [place];  Service: Vascular;  Laterality: Right;  . NEPHRECTOMY Right 2008   partial  . TESTICLE TORSION REDUCTION    . TONSILLECTOMY AND ADENOIDECTOMY    . VEIN HARVEST Right 11/05/2016   Procedure: RIGHT GREATER SAPPHENOUS VEIN HARVEST;  Surgeon: Conrad [place], MD;  Location: [place];  Service: Vascular;  Laterality: Right;      Family History  Problem Relation Age of Onset  . Heart disease Mother        Heart Disease before age 63  . Deep vein thrombosis Father   . Heart attack Father   . Other Other       reports that he has been smoking Cigarettes.  He has a 2.50 pack-year smoking history. He has never used smokeless tobacco. He reports that he uses drugs, including Marijuana. He reports that he does not drink alcohol.   Allergies  Allergen Reactions  . Dilaudid [Hydromorphone Hcl] Other (See Comments)    ABNORMAL BEHAVIORS "VERBALLY AND PHYSICALLY ABUSIVE" PSYCHOSIS  . Morphine And Related Itching and Other (See  Comments)    Prior to Admission medications   Medication Sig Start Date End Date Taking? Authorizing Provider  amLODipine (NORVASC) 10 MG tablet Take 1 tablet (10 mg total) by mouth daily. 11/28/16  Yes Angiulli, Lavon Paganini, PA-C  aspirin EC 81 MG tablet Take 81 mg by mouth daily.   Yes [provider]  calcitRIOL (ROCALTROL) 0.5 MCG capsule Take 1 capsule (0.5 mcg total) by mouth daily. 11/28/16  Yes Angiulli, Lavon Paganini, PA-C  cinacalcet (SENSIPAR) 60 MG tablet Take 1 tablet (60 mg total) by mouth daily. 11/28/16  Yes Angiulli, Lavon Paganini, PA-C  cloNIDine (CATAPRES - DOSED IN MG/24 HR) 0.2 mg/24hr patch Place 1 patch (0.2 mg total) onto the skin once a week. Patient taking differently: Place 0.2 mg onto the skin every Thursday.  11/28/16  Yes Angiulli, Lavon Paganini, PA-C  divalproex (DEPAKOTE SPRINKLE) 125 MG capsule Take 4 capsules (500 mg total) by mouth every 12 (twelve) hours. 11/28/16  Yes Angiulli, Lavon Paganini, PA-C  escitalopram (LEXAPRO) 10 MG tablet Take 1 tablet (10 mg total) by mouth daily. 11/28/16  Yes Angiulli, Lavon Paganini, PA-C  ferric citrate (AURYXIA) 1 GM 210 MG(Fe) tablet Take 2 tablets (420 mg total) by mouth 3 (three) times daily with meals. 11/28/16  Yes Angiulli, Lavon Paganini, PA-C  gabapentin (NEURONTIN) 100 MG capsule Take 1 capsule (100 mg total) by mouth 3 (three) times daily. Patient taking differently: Take 100 mg by mouth 2 (two) times daily. May take an additional 161m daily as needed for pain 11/28/16  Yes Angiulli, DLavon Paganini PA-C  gentamicin cream (GARAMYCIN) 0.1 % Apply 1 application topically daily as needed (tube cleaning).   Yes [provider]  hydrALAZINE (APRESOLINE) 100 MG tablet Take 1 tablet (100 mg total) by mouth every 8 (eight) hours. 11/28/16  Yes Angiulli, DLavon Paganini PA-C  HYDROcodone-acetaminophen (NORCO/VICODIN) 5-325 MG tablet Take 1-2 tablets by mouth every 6 (six) hours as needed for moderate pain. 11/28/16  Yes Angiulli, DLavon Paganini PA-C  isosorbide  dinitrate (ISORDIL) 5 MG tablet Take 1 tablet (5 mg total) by mouth 2 (two) times daily. 11/28/16  Yes Angiulli, DLavon Paganini PA-C  labetalol (NORMODYNE) 300 MG tablet Take 1 tablet (300 mg total) by mouth 2 (two) times daily. 11/28/16  Yes Angiulli, DLavon Paganini PA-C  LORazepam (ATIVAN) 1 MG tablet Take 1 tablet (1 mg total) by mouth every 6 (six) hours as needed for anxiety or sleep. Patient taking differently: Take 1 mg by mouth every 8 (eight) hours as needed for anxiety or sleep.  11/28/16  Yes Angiulli, DLavon Paganini PA-C  omeprazole (PRILOSEC) 40 MG capsule Take 1 capsule (40 mg total) by mouth daily. 11/28/16  Yes Angiulli, DLavon Paganini PA-C  pravastatin (PRAVACHOL) 20 MG tablet Take  1 tablet (20 mg total) by mouth daily. 11/28/16  Yes Angiulli, Lavon Paganini, PA-C  Vitamin D, Ergocalciferol, (DRISDOL) 50000 units CAPS capsule Take 50,000 Units by mouth every Thursday.    Yes [provider]  collagenase (SANTYL) ointment Apply topically daily. Patient not taking: Reported on 12/03/2016 11/28/16   Angiulli, Lavon Paganini, PA-C  docusate sodium (COLACE) 100 MG capsule Take 1 capsule (100 mg total) by mouth daily. Patient not taking: Reported on 12/03/2016 11/28/16   Angiulli, Lavon Paganini, PA-C  multivitamin (RENA-VIT) TABS tablet Take 1 tablet by mouth at bedtime. Patient not taking: Reported on 12/03/2016 11/28/16   Angiulli, Lavon Paganini, PA-C  saccharomyces boulardii (FLORASTOR) 250 MG capsule Take 1 capsule (250 mg total) by mouth 2 (two) times daily. Patient not taking: Reported on 12/03/2016 11/28/16   Angiulli, Lavon Paganini, PA-C    JSE:[id number] **OR** acetaminophen, bisacodyl, gentamicin cream, HYDROcodone-acetaminophen, LORazepam, magnesium citrate, methocarbamol **OR** methocarbamol (ROBAXIN)  IV, metoCLOPramide **OR** metoCLOPramide (REGLAN) injection, ondansetron **OR** ondansetron (ZOFRAN) IV, polyethylene glycol  Results for orders placed or performed during the hospital encounter of 12/04/16 (from the past 48  hour(s))  APTT     Status: None   Collection Time: 12/04/16  7:43 AM  Result Value Ref Range   aPTT 34 24 - 36 seconds  CBC     Status: Abnormal   Collection Time: 12/04/16  7:43 AM  Result Value Ref Range   WBC 12.2 (H) 4.0 - 10.5 K/uL   RBC 3.17 (L) 4.22 - 5.81 MIL/uL   Hemoglobin 8.1 (L) 13.0 - 17.0 g/dL   HCT 26.1 (L) 39.0 - 52.0 %   MCV 82.3 78.0 - 100.0 fL   MCH 25.6 (L) 26.0 - 34.0 pg   MCHC 31.0 30.0 - 36.0 g/dL   RDW 20.3 (H) 11.5 - 15.5 %   Platelets 640 (H) 150 - 400 K/uL  Comprehensive metabolic panel     Status: Abnormal   Collection Time: 12/04/16  7:43 AM  Result Value Ref Range   Sodium 134 (L) 135 - 145 mmol/L   Potassium 4.6 3.5 - 5.1 mmol/L   Chloride 94 (L) 101 - 111 mmol/L   CO2 19 (L) 22 - 32 mmol/L   Glucose, Bld 91 65 - 99 mg/dL   BUN 126 (H) 6 - 20 mg/dL   Creatinine, Ser 12.02 (H) 0.61 - 1.24 mg/dL   Calcium 7.4 (L) 8.9 - 10.3 mg/dL   Total Protein 5.6 (L) 6.5 - 8.1 g/dL   Albumin 1.9 (L) 3.5 - 5.0 g/dL   AST 21 15 - 41 U/L   ALT 23 17 - 63 U/L   Alkaline Phosphatase 234 (H) 38 - 126 U/L   Total Bilirubin 0.7 0.3 - 1.2 mg/dL   GFR calc non Af Amer 4 (L) >60 mL/min   GFR calc Af Amer 5 (L) >60 mL/min    Comment: (NOTE) The eGFR has been calculated using the CKD EPI equation. This calculation has not been validated in all clinical situations. eGFR's persistently <60 mL/min signify possible Chronic Kidney Disease.    Anion gap 21 (H) 5 - 15  Protime-INR     Status: None   Collection Time: 12/04/16  7:43 AM  Result Value Ref Range   Prothrombin Time 14.8 11.4 - 15.2 seconds   INR 1.15   Glucose, capillary     Status: None   Collection Time: 12/04/16  8:09 AM  Result Value Ref Range   Glucose-Capillary 98 65 -  99 mg/dL   Comment 1 Notify RN    Comment 2 Document in Chart   Glucose, capillary     Status: None   Collection Time: 12/04/16 11:25 AM  Result Value Ref Range   Glucose-Capillary 86 65 - 99 mg/dL   Comment 1 Notify RN    Comment  2 Document in Chart    gs"}.  ROS: see hpi  Physical Exam: Vitals:   12/04/16 1255 12/04/16 1310  BP: 103/62   Pulse: 68   Resp: 13   Temp:  97.5 F (36.4 C)     General: Lethargic adult WM  , not oriented  post op aka, opens eyes briefly , nad  HEENT: [place] , No JVD , MM dry  Heart: RRR 3/6 sem , no rub Lungs: CTA , nonlabored  Abdomen: BS pos  Soft , NT, ND, PD cath LU quad dressing intact Extremities: R AKA with wound vac in place/ L lower extrem no pedal edema Skin: R lower quad ecchymosis  (? Prior admit  Hep subcut ) Neuro: confused now post op  AKA , BUT follows comands. Moves all extrem to requests, grips hands equal not oriented  x3 Dialysis Access: PD cath  In place / L FA AVF  Pos bruit   Dialysis: [facility]. Last admit  CCPD: 3L fill 5 exchanges at night with 5th fill manually drained 1-1/2 hours later. Uses mostly 1's and 2's at home.  Assessment/Plan 1. ESRD -  CCPD use 1.5 tonight  2. 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 Sharol Given  3. Hypertension/volume  -  bp ok / no excess vol on exam / home meds listed 4bp meds =(was on with last admit)=Catapres patch 0.93m / Amlodipine 131m Qd, hydralazine 100 mg q 8hrs, Labetalol 300 mg bid 4. Anemia  - hgb 8.1  Was on 200 Aranesp  q Sunday at dc  Will give q thurs / ck iron level 5. Metabolic bone disease -  Corec ca 8.5  Was on in past calcitriol/cinacalcet/Auryxia binder  6. Nutrition - alb 1.9  Use carb mod renal diet and protein supplement (use Ensure dislikes [place] in past  7. DM type 2 - no meds diet only  Glu 91De BacaPA-C [id number]/11/2016, 1:18 PM

## 2016-12-04 NOTE — Telephone Encounter (Signed)
Sched appt 12/06/16 at 1:45 w/ NP. Pt  Confirmed appt.

## 2016-12-04 NOTE — Progress Notes (Signed)
Patient here from pacu, patient is not following commands, when asked what his name is he gave the thumbs up. Family states this has been his baseline for the past couple of days. Vital signs are stable

## 2016-12-04 NOTE — Telephone Encounter (Signed)
-----   Message from Mena Goes, RN sent at 11/29/2016  9:36 AM EDT ----- Regarding: 12-05-16 per [facility]   ----- Message ----- From: Conrad Irving, MD Sent: 11/29/2016   8:18 AM To: [address]  Hayden [name] [id number] 20-Feb-1971  Needs to be seen in office on 12/05/16.  Prefer to see in afternoon so PA can see pt.

## 2016-12-04 NOTE — Op Note (Signed)
12/04/2016  11:32 AM  PATIENT:  Hayden [name]    PRE-OPERATIVE DIAGNOSIS:  Gangrene Right Leg and Foot  POST-OPERATIVE DIAGNOSIS:  Same  PROCEDURE:  RIGHT ABOVE KNEE AMPUTATION Application of Prevena wound VAC.  SURGEON:  Newt Minion, MD  PHYSICIAN ASSISTANT: April green ANESTHESIA:   General  PREOPERATIVE INDICATIONS:  Hayden [name] is a  46 y.o. male with a diagnosis of Gangrene Right Leg and Foot who failed conservative measures and elected for surgical management.    The risks benefits and alternatives were discussed with the patient preoperatively including but not limited to the risks of infection, bleeding, nerve injury, cardiopulmonary complications, the need for revision surgery, among others, and the patient was willing to proceed.  OPERATIVE IMPLANTS: Prevena wound VAC  OPERATIVE FINDINGS: No abscess but muscle edematous proximal to the knee.  OPERATIVE PROCEDURE: Patient was brought to the operating room and underwent a general anesthetic. After adequate levels of anesthesia were obtained patient's right lower extremity was prepped using DuraPrep draped into sterile field the necrotic leg was draped out of the sterile field with impervious stockinette. A timeout was called. A fishmouth incision was made proximal to the patella. Electrocautery was used for superficial hemostasis. The vascular bundles medially were clamped and suture ligated with 2-0 silk. A reciprocating saw was used to perform the above knee amputation. The muscle was edematous but there was no abscess there was significant amount of edema in the soft tissue was well with clear fluid. After hemostasis was obtained the deep and superficial fascial layers and skin was closed using 2-0 nylon and staples. A Prevena wound VAC was applied this had a good suction fit patient was extubated taken to the PACU in stable condition.

## 2016-12-04 NOTE — Anesthesia Postprocedure Evaluation (Signed)
Anesthesia Post Note  Patient: Zaylen Susman Sharrar  Procedure(s) Performed: Procedure(s) (LRB): RIGHT ABOVE KNEE AMPUTATION (Right)     Patient location during evaluation: PACU Anesthesia Type: General Level of consciousness: awake and alert Pain management: pain level controlled Vital Signs Assessment: post-procedure vital signs reviewed and stable Respiratory status: spontaneous breathing, nonlabored ventilation and respiratory function stable Cardiovascular status: blood pressure returned to baseline and stable Postop Assessment: no signs of nausea or vomiting Anesthetic complications: no    Last Vitals:  Vitals:   12/04/16 1210 12/04/16 1225  BP: 120/80 136/71  Pulse:  68  Resp:  17  Temp:      Last Pain:  Vitals:   12/04/16 1155  TempSrc:   PainSc: Asleep                 Lynda Rainwater

## 2016-12-04 NOTE — Anesthesia Procedure Notes (Signed)
Procedure Name: Intubation Date/Time: 12/04/2016 10:47 AM Performed by: Shirlyn Goltz Pre-anesthesia Checklist: Patient identified, Emergency Drugs available, Patient being monitored and Suction available Patient Re-evaluated:Patient Re-evaluated prior to inductionOxygen Delivery Method: Circle system utilized Preoxygenation: Pre-oxygenation with 100% oxygen Intubation Type: IV induction Ventilation: Mask ventilation without difficulty Laryngoscope Size: Mac and 4 Grade View: Grade I Tube type: Oral Tube size: 7.5 mm Number of attempts: 1 Airway Equipment and Method: Stylet Placement Confirmation: ETT inserted through vocal cords under direct vision,  positive ETCO2 and breath sounds checked- equal and bilateral Secured at: 21 cm Tube secured with: Tape Dental Injury: Teeth and Oropharynx as per pre-operative assessment

## 2016-12-04 NOTE — Anesthesia Preprocedure Evaluation (Signed)
Anesthesia Evaluation  Patient identified by MRN, date of birth, ID band Patient unresponsive    Reviewed: Allergy & Precautions, NPO status , Patient's Chart, lab work & pertinent test results, reviewed documented beta blocker date and time , Unable to perform ROS - Chart review only  History of Anesthesia Complications (+) PONV and history of anesthetic complications  Airway Mallampati: II  TM Distance: >3 FB Neck ROM: Full    Dental  (+) Teeth Intact, Dental Advisory Given   Pulmonary Current Smoker,    Pulmonary exam normal breath sounds clear to auscultation       Cardiovascular hypertension, Pt. on home beta blockers and Pt. on medications +CHF   Rhythm:Regular Rate:Normal + Systolic murmurs Echo 08/04/38: Study Conclusions  - Left ventricle: The cavity size was normal. There was severe concentric hypertrophy. Systolic function was normal. The estimated ejection fraction was in the range of 60% to 65%. Wall motion was normal; there were no regional wall motion abnormalities. Doppler parameters are consistent with pseudonormal left ventricular relaxation (grade 2 diastolic dysfunction). The E/e&' ratio is >30 with medial e&' velocity <6, suggesting elevated LV filling pressure. - Aortic valve: Heavily calcified trileaflet aortic valve. Moderate stenosis. Mean gradient (S): 16 mm Hg. Peak gradient (S): 29 mm Hg. Valve area (VTI): 1.41 cm^2. Valve area (Vmax): 1.16 cm^2. - Mitral valve: Moderate posterior MAC. Mildly thickened leaflets. Mild stenosis. Mean gradient (D): 8 mm Hg. Valve area by pressure half-time: 1.9 cm^2. - Left atrium: The atrium was moderately dilated. - Right ventricle: The cavity size was normal. The moderator band was prominent. Systolic function was normal. - Right atrium: The atrium was mildly dilated. - Tricuspid valve: There was mild regurgitation. - Pulmonary arteries: PA peak pressure: 35 mm Hg (S). -  Inferior vena cava: The vessel was normal in size. The   respirophasic diameter changes were in the normal range (>= 50%), consistent with normal central venous pressure.  Impressions:  - Compared to a prior study in 2016, there is now severe LVH, EF remains normal at 60-65%, grade 2 DD with high LV filling pressure. There is now moderate LAE, mild mitral stenosis and mild pulmonary hypertension with RVSP of 35 mmHg.   Neuro/Psych PSYCHIATRIC DISORDERS Depression CVA    GI/Hepatic Neg liver ROS, GERD  ,  Endo/Other  diabetes, Poorly Controlled, Type 2  Renal/GU ESRF and DialysisRenal diseaseh/o kidney cancer s/p nephrectomy Peritoneal dialysis--MWF     Musculoskeletal negative musculoskeletal ROS (+)   Abdominal   Peds  Hematology  (+) Blood dyscrasia, anemia ,   Anesthesia Other Findings Day of surgery medications reviewed with the patient.  Reproductive/Obstetrics                             Anesthesia Physical  Anesthesia Plan  ASA: III  Anesthesia Plan: General   Post-op Pain Management:    Induction: Intravenous  PONV Risk Score and Plan:   Airway Management Planned: Oral ETT  Additional Equipment:   Intra-op Plan:   Post-operative Plan: Extubation in OR  Informed Consent: I have reviewed the patients History and Physical, chart, labs and discussed the procedure including the risks, benefits and alternatives for the proposed anesthesia with the patient or authorized representative who has indicated his/her understanding and acceptance.   Dental advisory given  Plan Discussed with: CRNA  Anesthesia Plan Comments:         Anesthesia Quick Evaluation

## 2016-12-04 NOTE — Progress Notes (Signed)
PHARMACY NOTE:  ANTIMICROBIAL RENAL DOSAGE ADJUSTMENT  Current antimicrobial regimen includes a mismatch between antimicrobial dosage and estimated renal function.  As per policy approved by the Pharmacy & Therapeutics and Medical Executive Committees, the antimicrobial dosage will be adjusted accordingly.  Current antimicrobial dosage:  Cefazolin 1g IV q6h x 3 doses post-op Indication: Post-op surgical prophylaxis  Renal Function: ESRD-PD  Estimated Creatinine Clearance: 8.1 mL/min (A) (by C-G formula based on SCr of 12.02 mg/dL (H)). [x]      On PD, scheduled:  []      On CRRT    Antimicrobial dosage has been changed to:  Discontinued - no additional doses are needed for post-op coverage.   Additional comments:  Alycia Rossetti, PharmD, BCPS Clinical Pharmacist 12/04/2016 1:38 PM

## 2016-12-04 NOTE — Progress Notes (Signed)
Family states that pt. Does not know exactly what is being done to him today. Waiting for Dr. Sharol Given to come in and talk with patient before signing consent,   Wife also expresses concern that pt. Medications will be changed while he is in the hospital,she would like to be notified before any changes made tomedications.

## 2016-12-04 NOTE — Transfer of Care (Signed)
Immediate Anesthesia Transfer of Care Note  Patient: Hayden [name]  Procedure(s) Performed: Procedure(s): RIGHT ABOVE KNEE AMPUTATION (Right)  Patient Location: PACU  Anesthesia Type:General  Level of Consciousness: awake, alert , oriented and patient cooperative  Airway & Oxygen Therapy: Patient Spontanous Breathing and Patient connected to nasal cannula oxygen  Post-op Assessment: Report given to RN and Post -op Vital signs reviewed and stable  Post vital signs: Reviewed and stable  Last Vitals:  Vitals:   12/04/16 0802  BP: (!) 143/65  Pulse: 79  Resp: 18  Temp: 36.8 C    Last Pain:  Vitals:   12/04/16 0825  TempSrc:   PainSc: 9          Complications: No apparent anesthesia complications

## 2016-12-04 NOTE — H&P (Signed)
Hayden [name] is an 46 y.o. male.   Chief Complaint: Painful necrotic ulcer right proximal tibia HPI: Patient is a 46 year old gentleman with a history of diabetes currently patient states he is not on medication for diabetes he has end-stage renal disease on peritoneal dialysis daily. Patient is status post revascularization with Dr. Bridgett Larsson for the right lower extremity he presents at this time with liquefied necrotic proximal tibial muscle a gangrenous ulcer of the left great toe and a flexion contraction of the right knee.  Past Medical History:  Diagnosis Date  . Anemia March 2014  . Cancer ([place])    Kidney  . CHF (congestive heart failure) ([place]) 5400   Acute systolic and diastolic CHF  . Depression    & rage --  was in counseling....great now  . Diabetes mellitus    NO DM SINCE LOST 130LBS  . ESRD on peritoneal dialysis ([place]) 2018   Started in-center HD approx 2015 for 2 years, then did about 1 year of home HD and then started peritoneal dialysis in early 2018.    . Gangrene ([place])    right leg and foot  . History of kidney cancer   . Hyperlipidemia   . Hypertension   . Obesity   . PONV (postoperative nausea and vomiting)   . Stroke [facility])     Past Surgical History:  Procedure Laterality Date  . ABDOMINAL AORTOGRAM W/LOWER EXTREMITY N/A 10/28/2016   Procedure: Abdominal Aortogram w/Lower Extremity;  Surgeon: Angelia Mould, MD;  Location: [facility];  Service: Cardiovascular;  Laterality: N/A;  . AMPUTATION Right 11/05/2016   Procedure: AMPUTATION RIGHT FIRST RAY;  Surgeon: Conrad Jean Lafitte, MD;  Location: [facility] OR;  Service: Vascular;  Laterality: Right;  . AV FISTULA PLACEMENT Left 01/26/2013   Procedure: ARTERIOVENOUS (AV) FISTULA CREATION - LEFT RADIAL CEPHALIC AVF;  Surgeon: Angelia Mould, MD;  Location: [facility];  Service: Vascular;  Laterality: Left;  . CHOLECYSTECTOMY  11/06/2015   Procedure: LAPAROSCOPIC CHOLECYSTECTOMY;  Surgeon: Ralene Ok, MD;   Location: [facility];  Service: General;;  . FEMORAL-POPLITEAL BYPASS GRAFT Right 11/05/2016   Procedure: BYPASS GRAFT FEMORAL-POPLITEAL ARTERY USING NON-REVERSED RIGHT GREATER SAPPHENOUS VEIN;  Surgeon: Conrad Penryn, MD;  Location: [facility];  Service: Vascular;  Laterality: Right;  . HERNIA REPAIR    . LOWER EXTREMITY ANGIOGRAM Right 10/30/2016   Procedure: Right  LOWER EXTREMITY ANGIOGRAM WITH RIGHT SUPERFICIAL FEMORAL ARTERY balloon angioplasty;  Surgeon: Conrad Prince George, MD;  Location: [facility];  Service: Vascular;  Laterality: Right;  . NEPHRECTOMY Right 2008   partial  . TESTICLE TORSION REDUCTION    . TONSILLECTOMY AND ADENOIDECTOMY    . VEIN HARVEST Right 11/05/2016   Procedure: RIGHT GREATER SAPPHENOUS VEIN HARVEST;  Surgeon: Conrad [place] Hills, MD;  Location: [facility] OR;  Service: Vascular;  Laterality: Right;    Family History  Problem Relation Age of Onset  . Heart disease Mother        Heart Disease before age 98  . Deep vein thrombosis Father   . Heart attack Father   . Other Other    Social History:  reports that he has been smoking Cigarettes.  He has a 2.50 pack-year smoking history. He has never used smokeless tobacco. He reports that he uses drugs, including Marijuana. He reports that he does not drink alcohol.  Allergies:  Allergies  Allergen Reactions  . Dilaudid [Hydromorphone Hcl] Other (See Comments)    ABNORMAL BEHAVIORS "VERBALLY AND PHYSICALLY  ABUSIVE" PSYCHOSIS  . Morphine And Related Itching and Other (See Comments)    No prescriptions prior to admission.    No results found for this or any previous visit (from the past 48 hour(s)). No results found.  Review of Systems  All other systems reviewed and are negative.   There were no vitals taken for this visit. Physical Exam  Patient is alert oriented no adenopathy well-dressed normal affect normal respiratory. As needed antalgic gait. Examination patient's previous surgical incision proximal medial tibial shows complete  liquefaction of the muscle. The ulcer probes all the way to bone approximately 8 cm deep. There is a flexion contracture of the right knee and with passive range of motion the knee still lacks 30 to full extension. Assessment/Plan Assessment: Peripheral vascular disease status post revascularization to the right lower extremity with liquefied muscle and ulceration right proximal tibia.  Plan: With patient's flexion contracture and liquefied soft tissue envelope at the knee patient does not have a limb salvage options. We will plan for a right above-the-knee amputation. Risk and benefits were discussed including need for higher level amputation. Patient states he understands wish to proceed at this time.  Newt Minion, MD 12/04/2016, 6:44 AM

## 2016-12-05 ENCOUNTER — Encounter (HOSPITAL_COMMUNITY): Payer: Self-pay | Admitting: Orthopedic Surgery

## 2016-12-05 LAB — RENAL FUNCTION PANEL
ANION GAP: 18 — AB (ref 5–15)
Albumin: 1.5 g/dL — ABNORMAL LOW (ref 3.5–5.0)
BUN: 109 mg/dL — ABNORMAL HIGH (ref 6–20)
CHLORIDE: 96 mmol/L — AB (ref 101–111)
CO2: 21 mmol/L — AB (ref 22–32)
Calcium: 6.8 mg/dL — ABNORMAL LOW (ref 8.9–10.3)
Creatinine, Ser: 10.59 mg/dL — ABNORMAL HIGH (ref 0.61–1.24)
GFR calc Af Amer: 6 mL/min — ABNORMAL LOW (ref >60)
GFR calc non Af Amer: 5 mL/min — ABNORMAL LOW (ref >60)
Glucose, Bld: 92 mg/dL (ref 65–99)
PHOSPHORUS: 11.5 mg/dL — AB (ref 2.5–4.6)
POTASSIUM: 4.2 mmol/L (ref 3.5–5.1)
Sodium: 135 mmol/L (ref 135–145)

## 2016-12-05 LAB — IRON AND TIBC
IRON: 18 ug/dL — AB (ref 45–182)
SATURATION RATIOS: 10 % — AB (ref 17.9–39.5)
TIBC: 185 ug/dL — AB (ref 250–450)
UIBC: 167 ug/dL

## 2016-12-05 LAB — CBC
HEMATOCRIT: 22.7 % — AB (ref 39.0–52.0)
HEMOGLOBIN: 7.1 g/dL — AB (ref 13.0–17.0)
MCH: 25.8 pg — AB (ref 26.0–34.0)
MCHC: 31.3 g/dL (ref 30.0–36.0)
MCV: 82.5 fL (ref 78.0–100.0)
Platelets: 537 10*3/uL — ABNORMAL HIGH (ref 150–400)
RBC: 2.75 MIL/uL — ABNORMAL LOW (ref 4.22–5.81)
RDW: 20 % — ABNORMAL HIGH (ref 11.5–15.5)
WBC: 11.2 10*3/uL — ABNORMAL HIGH (ref 4.0–10.5)

## 2016-12-05 LAB — PHOSPHORUS: Phosphorus: 6.1 mg/dL — ABNORMAL HIGH (ref 2.5–4.6)

## 2016-12-05 LAB — GLUCOSE, CAPILLARY
Glucose-Capillary: 142 mg/dL — ABNORMAL HIGH (ref 65–99)
Glucose-Capillary: 93 mg/dL (ref 65–99)
Glucose-Capillary: 81 mg/dL (ref 65–99)

## 2016-12-05 NOTE — Progress Notes (Signed)
PT Cancellation Note  Patient Details Name: SKYLER CAREL MRN: [id number] DOB: June 13, 1971   Cancelled Treatment:    Reason Eval/Treat Not Completed: Fatigue/lethargy limiting ability to participate; attempted to see pt and currently sleeping, RN states unable to follow commands for her.  Will attempt later if pt more alert and able to participate.    Reginia Naas 12/05/2016, 10:48 AM  [name], [place] 12/05/2016

## 2016-12-05 NOTE — Progress Notes (Signed)
PT Cancellation Note  Patient Details Name: Hayden [name] MRN: [id number] DOB: March 17, 1971   Cancelled Treatment:    Reason Eval/Treat Not Completed: Fatigue/lethargy limiting ability to participate; second attempt, pt still unable to rouse with moderate stimulation.  Family in room reports plans for dialysis to try and clear out sedating meds.  Will attempt to see another day.   Reginia Naas 12/05/2016, 2:56 PM  [name], [place] 12/05/2016

## 2016-12-05 NOTE — Progress Notes (Signed)
Bedside RN notified per HD of change in mentation.  Patient with eyes open, commanding and speaking.  Will continue to monitor.

## 2016-12-05 NOTE — Progress Notes (Signed)
Subjective:  CCPD last night tolerated  With 448 uf / this am nonverbal opens eyes to  sternal rub   Objective Vital signs in last 24 hours: Vitals:   12/04/16 1956 12/04/16 2110 12/05/16 0513 12/05/16 0700  BP: (!) 124/58 131/64 (!) 109/45 (!) 105/49  Pulse: 70  66 67  Resp: 12 18 18 17   Temp: 97.7 F (36.5 C) 97.5 F (36.4 C) 97.5 F (36.4 C) 97.1 F (36.2 C)  TempSrc: Oral Oral Oral Oral  SpO2: 94% 100%  99%  Weight:    87.1 kg (192 lb)   Weight change:   Physical Exam: General: lethargic nonverbal, opens eyes to sternal rub , follows command to grip and moves R  Leg when asked / VVS with 99% o2 sat Heart: RRR  2/6 sem no rub  Lungs: CTA nonlabored Abdomen: bs pos soft NT , ND Extremities: no pedal edema Left lower extrem / R AKA  wound vac in place Dialysis Access: pd cath / pos bruit AVF LFA    Dialysis:[facility]. Last admit  CCPD: 3L fill 5 exchanges at night with 5th fill manually drained 1-1/2 hours later. Uses mostly 1's and 2's at home.  Problem/Plan: 1. AMS - Post op ?pain meds last pm vs anesthesia  / vs uremia - Check renal panel if Bun Still ^^ plan for HD with AVF  2. ESRD - CCPD last night /  Noted admit  BUN 126/ Cr 12.02 yest . Per wife  yest missed PD exchanges tues/but probably missing more or ?? decr exchanges /not clearing  With PD, [facility] training "no data available" 3. HTN/volume - bp 105/49 / no excess vol on exam / home meds listed 4 bp meds =(was on with last admit)=Catapres patch 0.2mg  / Amlodipine 10mg   Qd, hydralazine 100 mg q 8hrs, Labetalol 300 mg bid/  meds on hold with decreased Mentation / will hold today  4. SP R AKA Wound Vac -  rx per Dr. Sharol Given /   wife not in Room if Pt goes to NH need to change from CCPD to HD / noted prior admit did in hosp. Rehab  5. Anemia - hgb 8.1  Aranesp 200 subcut q Thursday /  6. Metabolic bone disease -   Yest Corec ca 8.5  on  Calcitriol/  Cinacalcet/ Auryxia binder  7. Nutrition - alb 1.9  Use  carb mod renal diet and protein supplement (use Ensure dislikes Nepro in past  8. DM type 2 - no meds diet only  Glu 91 Secondary hyperparathyroidism -   Ernest Haber, PA-C [facility] Beeper [phone] 12/05/2016,10:19 AM  LOS: 1 day   Labs: Basic Metabolic Panel:  Recent Labs Lab 12/04/16 0743  NA 134*  K 4.6  CL 94*  CO2 19*  GLUCOSE 91  BUN 126*  CREATININE 12.02*  CALCIUM 7.4*   Liver Function Tests:  Recent Labs Lab 12/04/16 0743  AST 21  ALT 23  ALKPHOS 234*  BILITOT 0.7  PROT 5.6*  ALBUMIN 1.9*   No results for input(s): LIPASE, AMYLASE in the last 168 hours. No results for input(s): AMMONIA in the last 168 hours. CBC:  Recent Labs Lab 12/04/16 0743  WBC 12.2*  HGB 8.1*  HCT 26.1*  MCV 82.3  PLT 640*   Cardiac Enzymes: No results for input(s): CKTOTAL, CKMB, CKMBINDEX, TROPONINI in the last 168 hours. CBG:  Recent Labs Lab 12/04/16 0809 12/04/16 1125 12/04/16 1711 12/04/16 2138 12/05/16  0622  GLUCAP 98 86 115* 99 142*    Studies/Results: No results found. Medications: . sodium chloride 10 mL/hr at 12/04/16 1718  . methocarbamol (ROBAXIN)  IV     . amLODipine  10 mg Oral Daily  . aspirin EC  325 mg Oral Daily  . calcitRIOL  0.5 mcg Oral Daily  . cinacalcet  60 mg Oral QPC supper  . cloNIDine  0.2 mg Transdermal Q Thu  . darbepoetin (ARANESP) injection - NON-DIALYSIS  200 mcg Subcutaneous Q Thu-1800  . divalproex  500 mg Oral Q12H  . docusate sodium  100 mg Oral BID  . escitalopram  10 mg Oral Daily  . ferric citrate  420 mg Oral TID WC  . gabapentin  100 mg Oral TID  . gentamicin cream  1 application Topical Daily  . hydrALAZINE  100 mg Oral Q8H  . isosorbide dinitrate  5 mg Oral BID  . ketorolac  15 mg Intravenous Q6H  . labetalol  300 mg Oral BID  . pantoprazole  40 mg Oral Daily  . pravastatin  20 mg Oral q1800

## 2016-12-05 NOTE — Progress Notes (Signed)
Patient ID: Hayden [name], male   DOB: 1970/07/23, 46 y.o.   MRN: [id number] Postoperative day 1 right above-the-knee amputation. Patient resting comfortably this morning. Patient completing his peritoneal dialysis. Patient's O2 sats in the high 90 percentile. Anticipate discharge to skilled nursing facility.

## 2016-12-05 NOTE — Progress Notes (Signed)
Dialysis treatment completed.  500 mL ultrafiltrated and net fluid removal 0 mL.    Patient status Alert, eyes open spontaneously, oriented to self. Lung sounds clear to ausculation in all fields. Generalized edema. Cardiac: NSR.  Disconnected lines and removed needles.  Pressure held for 10 minutes and band aid/gauze dressing applied.  Report given to bedside RN, Mindy.

## 2016-12-05 NOTE — Progress Notes (Signed)
Patient arrived to unit per bed.  Reviewed treatment plan and this RN agrees.  Report received from bedside RN, York Cerise.  Consent obatined.  Patient A & O X 4. Lung sounds diminished to ausculation in all fields. Generalized edema. Cardiac: NSR.  Prepped LLAVF with alcohol and cannulated with two 17 gauge needles.  Pulsation of blood noted.  Flushed access well with saline per protocol.  Connected and secured lines and initiated tx at 1603.  UF goal of 500 mL and net fluid removal of 0 mL.  Will continue to monitor.

## 2016-12-05 NOTE — Progress Notes (Signed)
Spoke with Dr. Sharol Given via phone regarding patient's mental status. Pt required continuous stimulation to open eyes. Pt following minimal commands and nonverbal so far this shift. Pt unable to look at RN, minimally able to squeeze hands with weak grip. Pt will only briefly open eyes before falling back to sleep.  Per Dr. Sharol Given this was to be expected for the patient at this time and to refrain from giving any narcotic or sedating medications. Per Dr. Sharol Given pt to have nothing by mouth until more alert. Vital signs stable. Will continue to monitor. Roselyn Reef Xavier Munger,RN

## 2016-12-05 NOTE — Progress Notes (Signed)
HD RN attempted report to bedside, unable to reach due to shift change report.  Per unit clerk, hospital policy dictates [facility] cannot receive patients from [place] to 1930.  HD manager notified of delay.  Will continue to monitor.

## 2016-12-05 NOTE — Progress Notes (Signed)
Left message with wife that patient is back from dialysis

## 2016-12-06 ENCOUNTER — Ambulatory Visit: Payer: 59 | Admitting: Family

## 2016-12-06 LAB — GLUCOSE, CAPILLARY
GLUCOSE-CAPILLARY: 76 mg/dL (ref 65–99)
Glucose-Capillary: 95 mg/dL (ref 65–99)
Glucose-Capillary: 73 mg/dL (ref 65–99)

## 2016-12-06 LAB — CBC
HCT: 21.3 % — ABNORMAL LOW (ref 39.0–52.0)
Hemoglobin: 6.6 g/dL — CL (ref 13.0–17.0)
MCH: 25.9 pg — AB (ref 26.0–34.0)
MCHC: 31 g/dL (ref 30.0–36.0)
MCV: 83.5 fL (ref 78.0–100.0)
PLATELETS: 498 10*3/uL — AB (ref 150–400)
RBC: 2.55 MIL/uL — ABNORMAL LOW (ref 4.22–5.81)
RDW: 19.8 % — AB (ref 11.5–15.5)
WBC: 10.3 10*3/uL (ref 4.0–10.5)

## 2016-12-06 LAB — RENAL FUNCTION PANEL
Albumin: 1.6 g/dL — ABNORMAL LOW (ref 3.5–5.0)
Anion gap: 13 (ref 5–15)
BUN: 61 mg/dL — ABNORMAL HIGH (ref 6–20)
CHLORIDE: 96 mmol/L — AB (ref 101–111)
CO2: 25 mmol/L (ref 22–32)
CREATININE: 7.46 mg/dL — AB (ref 0.61–1.24)
Calcium: 6.9 mg/dL — ABNORMAL LOW (ref 8.9–10.3)
GFR calc Af Amer: 9 mL/min — ABNORMAL LOW (ref >60)
GFR, EST NON AFRICAN AMERICAN: 8 mL/min — AB (ref >60)
Glucose, Bld: 88 mg/dL (ref 65–99)
Phosphorus: 7.6 mg/dL — ABNORMAL HIGH (ref 2.5–4.6)
Potassium: 4.1 mmol/L (ref 3.5–5.1)
SODIUM: 134 mmol/L — AB (ref 135–145)

## 2016-12-06 LAB — PREPARE RBC (CROSSMATCH)

## 2016-12-06 LAB — HEMOGLOBIN AND HEMATOCRIT, BLOOD
HEMATOCRIT: 27.1 % — AB (ref 39.0–52.0)
HEMOGLOBIN: 8.7 g/dL — AB (ref 13.0–17.0)

## 2016-12-06 MED ORDER — NEPRO/CARBSTEADY PO LIQD: 237.0000 mL | Freq: Two times a day (BID) | ORAL | Status: DC

## 2016-12-06 MED ORDER — SODIUM CHLORIDE 0.9 % IV SOLN: Freq: Once | INTRAVENOUS | Status: DC

## 2016-12-06 NOTE — Progress Notes (Signed)
PT Note:  Pt off the unit at dialysis.  Will check back as schedule permits.  Santiago Glad L. Tamala Julian, [place] Pager [phone] 12/06/2016

## 2016-12-06 NOTE — NC FL2 (Signed)
[place] LEVEL OF CARE SCREENING TOOL     IDENTIFICATION  Patient Name: Hayden [name] Birthdate: [birth year] Sex: male Admission Date (Current Location): 12/04/2016  [facility] and [place] Number:  Herbalist and Address:  [facility], [place] [address], [place], [place] [address]      Provider Number: [id number]  Attending Physician Name and Address:  Newt Minion, MD  Relative Name and Phone Number:       Current Level of Care: Hospital Recommended Level of Care: [place] Prior Approval Number:    Date Approved/Denied:   PASRR Number:    Discharge Plan: SNF    Current Diagnoses: Patient Active Problem List   Diagnosis Date Noted  . Above knee amputation of right lower extremity ([place]) 12/04/2016  . Atherosclerosis of native arteries of extremities with gangrene, right leg ([place]) 12/02/2016  . Neuropathic pain   . Hypertensive crisis   . Labile blood pressure   . Type 2 diabetes mellitus with diabetic peripheral angiopathy and gangrene, without long-term current use of insulin ([place])   . Debility 11/22/2016  . Anemia of chronic disease   . Diabetes mellitus type 2 in nonobese (HCC)   . Confusion   . S/P femoral-popliteal bypass surgery   . Tobacco abuse   . Benign essential HTN   . History of CVA (cerebrovascular accident)   . Post-operative pain   . Acute respiratory failure with hypoxia ([place])   . Agitation 11/04/2016  . Acute encephalopathy 11/04/2016  . Cellulitis of right lower extremity   . Hypokalemia 11/02/2016  . QT prolongation   . Gangrene of right foot ([place])   . Pressure injury of skin 10/24/2016  . Cellulitis of great toe of right foot 10/23/2016  . Leukocytosis 10/23/2016  . Hyperkalemia 10/23/2016  . Abdominal hematoma 11/15/2015  . HCAP (healthcare-associated pneumonia) 11/15/2015  . Acute blood loss anemia 11/15/2015  . Absolute anemia   . Right lower quadrant pain   . Pneumonia  involving right lung 11/04/2015  . HTN (hypertension) 11/04/2015  . Noninfectious gastroenteritis and colitis 11/04/2015  . Gastroenteritis 11/04/2015  . CVA (cerebral infarction) 06/19/2015  . End stage renal disease on dialysis ([place]) 06/19/2015  . Lacunar infarct, acute ([place]) 06/19/2015  . GERD (gastroesophageal reflux disease) 06/19/2015  . Anxiety and depression 06/19/2015  . Hypertension, well controlled 06/19/2015  . End stage renal disease ([place]) 01/21/2013  . Acute coronary syndrome ([place]) 09/22/2012  . History of partial Right nephrectomy for renal mass (2008) 09/22/2012  . Left ventricular hypertrophy (moderate) per ECHO 2010 09/22/2012  . Chronic systolic congestive heart failure/EF 40% PER echo 2010 09/22/2012  . 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 Acute kidney injury ([place]) 09/21/2012  . Chest pain 09/21/2012    Orientation RESPIRATION BLADDER Height & Weight     Self, Place  Normal Incontinent Weight: 191 lb 12.8 oz (87 kg) Height:     BEHAVIORAL SYMPTOMS/MOOD NEUROLOGICAL BOWEL NUTRITION STATUS      Incontinent Diet (fluid restriction, renal)  AMBULATORY STATUS COMMUNICATION OF NEEDS Skin   Extensive Assist Verbally Wound Vac, Surgical wounds (Prevena wound vac on right AKA site)                       Personal Care Assistance Level of Assistance  Bathing, Dressing Bathing Assistance: Maximum assistance   Dressing Assistance: Maximum assistance     Functional Limitations Info  SPECIAL CARE FACTORS FREQUENCY  PT (By licensed PT), OT (By licensed OT)     PT Frequency: 5/wk OT Frequency: 5/wk            Contractures      Additional Factors Info  Code Status, Allergies Code Status Info: FULL Allergies Info: Dilaudid Hydromorphone Hcl, Morphine And Related           Current Medications (12/06/2016):  This is the current hospital active medication list Current Facility-Administered Medications  Medication Dose Route  Frequency Provider Last Rate Last Dose  . 0.9 %  sodium chloride infusion   Intravenous Continuous Newt Minion, MD 10 mL/hr at 12/04/16 1718    . 0.9 %  sodium chloride infusion   Intravenous Once Ejigiri, Thomos Lemons, PA-C      . acetaminophen (TYLENOL) tablet 650 mg  650 mg Oral Q6H PRN Newt Minion, MD       Or  . acetaminophen (TYLENOL) suppository 650 mg  650 mg Rectal Q6H PRN Newt Minion, MD      . amLODipine (NORVASC) tablet 10 mg  10 mg Oral Daily Newt Minion, MD   10 mg at 12/06/16 0940  . aspirin EC tablet 325 mg  325 mg Oral Daily Newt Minion, MD   325 mg at 12/06/16 0940  . bisacodyl (DULCOLAX) suppository 10 mg  10 mg Rectal Daily PRN Newt Minion, MD      . calcitRIOL (ROCALTROL) capsule 0.5 mcg  0.5 mcg Oral Daily Newt Minion, MD   0.5 mcg at 12/06/16 0940  . cinacalcet (SENSIPAR) tablet 60 mg  60 mg Oral QPC supper Newt Minion, MD   60 mg at 12/05/16 2011  . cloNIDine (CATAPRES - Dosed in mg/24 hr) patch 0.2 mg  0.2 mg Transdermal Q Wellington Hampshire, MD      . Darbepoetin Alfa (ARANESP) injection 200 mcg  200 mcg Subcutaneous Q Thu-1800 Ernest Haber, PA-C   200 mcg at 12/05/16 2012  . divalproex (DEPAKOTE SPRINKLE) capsule 500 mg  500 mg Oral Q12H Newt Minion, MD   500 mg at 12/06/16 2409  . docusate sodium (COLACE) capsule 100 mg  100 mg Oral BID Newt Minion, MD   100 mg at 12/06/16 0940  . escitalopram (LEXAPRO) tablet 10 mg  10 mg Oral Daily Newt Minion, MD   10 mg at 12/06/16 0940  . ferric citrate (AURYXIA) tablet 420 mg  420 mg Oral TID WC Newt Minion, MD   420 mg at 12/06/16 0800  . gabapentin (NEURONTIN) capsule 100 mg  100 mg Oral TID Newt Minion, MD   100 mg at 12/06/16 0939  . gentamicin cream (GARAMYCIN) 0.1 % 1 application  1 application Topical Daily PRN Newt Minion, MD   1 application at 73/53/29 2250  . gentamicin cream (GARAMYCIN) 0.1 % 1 application  1 application Topical Daily Rexene Agent, MD   1 application at  92/42/68 0940  . hydrALAZINE (APRESOLINE) tablet 100 mg  100 mg Oral Q8H Newt Minion, MD   100 mg at 12/06/16 0551  . HYDROcodone-acetaminophen (NORCO) 10-325 MG per tablet 1-2 tablet  1-2 tablet Oral Q4H PRN Newt Minion, MD   2 tablet at 12/06/16 0940  . isosorbide dinitrate (ISORDIL) tablet 5 mg  5 mg Oral BID Newt Minion, MD   5 mg at 12/06/16 0940  . labetalol (NORMODYNE) tablet 300 mg  300 mg Oral BID Newt Minion, MD   300 mg at 12/06/16 0940  . LORazepam (ATIVAN) tablet 1 mg  1 mg Oral Q6H PRN Newt Minion, MD   1 mg at 12/05/16 2250  . magnesium citrate solution 1 Bottle  1 Bottle Oral Once PRN Newt Minion, MD      . methocarbamol (ROBAXIN) tablet 500 mg  500 mg Oral Q6H PRN Newt Minion, MD       Or  . methocarbamol (ROBAXIN) 500 mg in dextrose 5 % 50 mL IVPB  500 mg Intravenous Q6H PRN Newt Minion, MD      . metoCLOPramide (REGLAN) tablet 5-10 mg  5-10 mg Oral Q8H PRN Newt Minion, MD       Or  . metoCLOPramide (REGLAN) injection 5-10 mg  5-10 mg Intravenous Q8H PRN Newt Minion, MD      . ondansetron [facility]) tablet 4 mg  4 mg Oral Q6H PRN Newt Minion, MD       Or  . ondansetron [facility]) injection 4 mg  4 mg Intravenous Q6H PRN Newt Minion, MD      . pantoprazole (PROTONIX) EC tablet 40 mg  40 mg Oral Daily Newt Minion, MD   40 mg at 12/06/16 0940  . polyethylene glycol (MIRALAX / GLYCOLAX) packet 17 g  17 g Oral Daily PRN Newt Minion, MD      . pravastatin (PRAVACHOL) tablet 20 mg  20 mg Oral q1800 Newt Minion, MD   20 mg at 12/05/16 2011     Discharge Medications: Please see discharge summary for a list of discharge medications.  Relevant Imaging Results:  Relevant Lab Results:   Additional Information SS#: [id number]; patient will be received dialysis at [facility] location- unsure if MWF or TTS at this time but will need transport to dialysis  [place] Seaborn, Connye Burkitt, LCSW

## 2016-12-06 NOTE — Progress Notes (Signed)
Patient ID: Hayden [name], male   DOB: 12-13-1970, 47 y.o.   MRN: [id number] Patient appears back to his baseline mental status this morning. He is alert answers questions he has no complaints. Plan for discharge to skilled nursing when bed available.

## 2016-12-06 NOTE — Consult Note (Addendum)
Physical Medicine and Rehabilitation Consult   Reason for Consult: R-AKA Referring Physician: Dr. Sharol Given   HPI: Hayden [name] is a 46 y.o. male with history of ESRD, T2DM, PVD s/p revascularization (recent CIR admission) who was admitted 12/04/16 with necrotic changes right tibial muscle and flexion contracture at knee and underwent R-AKA by Dr. Sharol Given. History taken from chart review.  Post op has had issues with confusion and lethargy due to encephalopathy. Mentation improved past HD and was able to participate in therapy, however, appears to wax/wane. Hospital course complicated by post-op pain, leukocytosis, ABLA. CIR recommended due to decline in functional mobility.   Review of Systems  Unable to perform ROS: Mental acuity      Past Medical History:  Diagnosis Date  . Anemia March 2014  . Cancer ([place])    Kidney  . CHF (congestive heart failure) ([place]) 7124   Acute systolic and diastolic CHF  . Depression    & rage --  was in counseling....great now  . Diabetes mellitus    NO DM SINCE LOST 130LBS  . ESRD on peritoneal dialysis ([place]) 2018   Started in-center HD approx 2015 for 2 years, then did about 1 year of home HD and then started peritoneal dialysis in early 2018.    . Gangrene ([place])    right leg and foot  . History of kidney cancer   . Hyperlipidemia   . Hypertension   . Obesity   . PONV (postoperative nausea and vomiting)   . Stroke [facility])     Past Surgical History:  Procedure Laterality Date  . ABDOMINAL AORTOGRAM W/LOWER EXTREMITY N/A 10/28/2016   Procedure: Abdominal Aortogram w/Lower Extremity;  Surgeon: Angelia Mould, MD;  Location: [place] [facility];  Service: Cardiovascular;  Laterality: N/A;  . AMPUTATION Right 11/05/2016   Procedure: AMPUTATION RIGHT FIRST RAY;  Surgeon: Conrad [place]port, MD;  Location: [place];  Service: Vascular;  Laterality: Right;  . AMPUTATION Right 12/04/2016   Procedure: RIGHT ABOVE KNEE AMPUTATION;  Surgeon: Newt Minion,  MD;  Location: [place];  Service: Orthopedics;  Laterality: Right;  . AV FISTULA PLACEMENT Left 01/26/2013   Procedure: ARTERIOVENOUS (AV) FISTULA CREATION - LEFT RADIAL CEPHALIC AVF;  Surgeon: Angelia Mould, MD;  Location: [place];  Service: Vascular;  Laterality: Left;  . CHOLECYSTECTOMY  11/06/2015   Procedure: LAPAROSCOPIC CHOLECYSTECTOMY;  Surgeon: Ralene Ok, MD;  Location: [place];  Service: General;;  . FEMORAL-POPLITEAL BYPASS GRAFT Right 11/05/2016   Procedure: BYPASS GRAFT FEMORAL-POPLITEAL ARTERY USING NON-REVERSED RIGHT GREATER SAPPHENOUS VEIN;  Surgeon: Conrad Dibble, MD;  Location: [place];  Service: Vascular;  Laterality: Right;  . HERNIA REPAIR    . LOWER EXTREMITY ANGIOGRAM Right 10/30/2016   Procedure: Right  LOWER EXTREMITY ANGIOGRAM WITH RIGHT SUPERFICIAL FEMORAL ARTERY balloon angioplasty;  Surgeon: Conrad [place], MD;  Location: [place];  Service: Vascular;  Laterality: Right;  . NEPHRECTOMY Right 2008   partial  . TESTICLE TORSION REDUCTION    . TONSILLECTOMY AND ADENOIDECTOMY    . VEIN HARVEST Right 11/05/2016   Procedure: RIGHT GREATER SAPPHENOUS VEIN HARVEST;  Surgeon: Conrad Haubstadt, MD;  Location: [facility] OR;  Service: Vascular;  Laterality: Right;    Family History  Problem Relation Age of Onset  . Heart disease Mother        Heart Disease before age 79  . Deep vein thrombosis Father   . Heart attack Father   . Other  Other     Social History:  reports that he has been smoking Cigarettes.  He has a 2.50 pack-year smoking history. He has never used smokeless tobacco. He reports that he uses drugs, including Marijuana. He reports that he does not drink alcohol.     Allergies  Allergen Reactions  . Dilaudid [Hydromorphone Hcl] Other (See Comments)    ABNORMAL BEHAVIORS "VERBALLY AND PHYSICALLY ABUSIVE" PSYCHOSIS  . Morphine And Related Itching and Other (See Comments)    Medications Prior to Admission  Medication Sig Dispense Refill  . amLODipine (NORVASC) 10 MG  tablet Take 1 tablet (10 mg total) by mouth daily. 30 tablet 0  . aspirin EC 81 MG tablet Take 81 mg by mouth daily.    . calcitRIOL (ROCALTROL) 0.5 MCG capsule Take 1 capsule (0.5 mcg total) by mouth daily. 30 capsule 0  . cinacalcet (SENSIPAR) 60 MG tablet Take 1 tablet (60 mg total) by mouth daily. 30 tablet 0  . cloNIDine (CATAPRES - DOSED IN MG/24 HR) 0.2 mg/24hr patch Place 1 patch (0.2 mg total) onto the skin once a week. (Patient taking differently: Place 0.2 mg onto the skin every Thursday. ) 4 patch 12  . divalproex (DEPAKOTE SPRINKLE) 125 MG capsule Take 4 capsules (500 mg total) by mouth every 12 (twelve) hours. 60 capsule 0  . escitalopram (LEXAPRO) 10 MG tablet Take 1 tablet (10 mg total) by mouth daily. 30 tablet 0  . ferric citrate (AURYXIA) 1 GM 210 MG(Fe) tablet Take 2 tablets (420 mg total) by mouth 3 (three) times daily with meals. 270 tablet 0  . gabapentin (NEURONTIN) 100 MG capsule Take 1 capsule (100 mg total) by mouth 3 (three) times daily. (Patient taking differently: Take 100 mg by mouth 2 (two) times daily. May take an additional 100mg  daily as needed for pain) 90 capsule 0  . gentamicin cream (GARAMYCIN) 0.1 % Apply 1 application topically daily as needed (tube cleaning).    . hydrALAZINE (APRESOLINE) 100 MG tablet Take 1 tablet (100 mg total) by mouth every 8 (eight) hours. 90 tablet 0  . HYDROcodone-acetaminophen (NORCO/VICODIN) 5-325 MG tablet Take 1-2 tablets by mouth every 6 (six) hours as needed for moderate pain. 30 tablet 0  . isosorbide dinitrate (ISORDIL) 5 MG tablet Take 1 tablet (5 mg total) by mouth 2 (two) times daily. 60 tablet 0  . labetalol (NORMODYNE) 300 MG tablet Take 1 tablet (300 mg total) by mouth 2 (two) times daily. 60 tablet 0  . LORazepam (ATIVAN) 1 MG tablet Take 1 tablet (1 mg total) by mouth every 6 (six) hours as needed for anxiety or sleep. (Patient taking differently: Take 1 mg by mouth every 8 (eight) hours as needed for anxiety or sleep. )  30 tablet 0  . omeprazole (PRILOSEC) 40 MG capsule Take 1 capsule (40 mg total) by mouth daily. 30 capsule 0  . pravastatin (PRAVACHOL) 20 MG tablet Take 1 tablet (20 mg total) by mouth daily. 30 tablet 0  . Vitamin D, Ergocalciferol, (DRISDOL) 50000 units CAPS capsule Take 50,000 Units by mouth every Thursday.     . collagenase (SANTYL) ointment Apply topically daily. (Patient not taking: Reported on 12/03/2016) 15 g 0  . docusate sodium (COLACE) 100 MG capsule Take 1 capsule (100 mg total) by mouth daily. (Patient not taking: Reported on 12/03/2016) 10 capsule 0  . multivitamin (RENA-VIT) TABS tablet Take 1 tablet by mouth at bedtime. (Patient not taking: Reported on 12/03/2016) 30 tablet 0  .  saccharomyces boulardii (FLORASTOR) 250 MG capsule Take 1 capsule (250 mg total) by mouth 2 (two) times daily. (Patient not taking: Reported on 12/03/2016) 60 capsule 0    Home: Home Living Family/patient expects to be discharged to:: Unsure Living Arrangements: Spouse/significant other (1 child) Available Help at Discharge: Family, Available 24 hours/day, Other (Comment) (son available this summer) Type of Home: Mobile home Home Access: Stairs to enter [employer] of Steps: 4 (working on a ramp) Entrance Stairs-Rails: Right, Left, Can reach both Home Layout: One level Bathroom Shower/Tub: Gaffer, Charity fundraiser: Standard Bathroom Accessibility: Yes Home Equipment: Cane - single point, Grab bars - tub/shower, Hand held shower head  Lives With: Spouse, Son  Functional History: Prior Function Level of Independence: Needs assistance Gait / Transfers Assistance Needed: stayed in w/c lately Functional Status:  Mobility: Bed Mobility Overal bed mobility: Needs Assistance Bed Mobility: Supine to Sit, Sit to Supine Supine to sit: Min assist Sit to supine: Min assist General bed mobility comments: stability assist during movement.  multimodal cues to keep on task, help  initiate. Transfers General transfer comment: unable to get pt to attempt transfers.   Difficult to keep on task.      ADL:    Cognition: Cognition Overall Cognitive Status: Impaired/Different from baseline Orientation Level: Oriented to person, Disoriented to place, Disoriented to time, Disoriented to situation Cognition Arousal/Alertness: Awake/alert Behavior During Therapy: Flat affect Overall Cognitive Status: Impaired/Different from baseline Area of Impairment: Following commands, Attention, Safety/judgement, Awareness Current Attention Level: Sustained Following Commands: Follows one step commands with increased time, Follows one step commands inconsistently Safety/Judgement: Decreased awareness of safety, Decreased awareness of deficits Awareness: Intellectual Problem Solving: Slow processing, Decreased initiation, Requires verbal cues, Requires tactile cues   Blood pressure (!) 172/70, pulse 70, temperature 98.4 F (36.9 C), resp. rate 16, weight 84.2 kg (185 lb 10 oz), SpO2 95 %. Physical Exam  Nursing note and vitals reviewed. Constitutional: He appears well-developed and well-nourished.  HENT:  Head: Normocephalic and atraumatic.  Eyes: Conjunctivae and EOM are normal. Pupils are equal, round, and reactive to light.  Neck: Normal range of motion. Neck supple.  Cardiovascular: Normal rate and regular rhythm.   Respiratory: Effort normal and breath sounds normal. No respiratory distress. He has no wheezes.  GI: Soft. Bowel sounds are normal. He exhibits no distension. There is no tenderness.  Musculoskeletal: He exhibits tenderness. He exhibits no edema.  Neurological: He is alert.  Smiles but looks blankly.  Intermittent tremors noted.  Not following commands  Skin:  +VAC right AKA    Results for orders placed or performed during the hospital encounter of 12/04/16 (from the past 24 hour(s))  Iron and TIBC     Status: Abnormal   Collection Time: 12/05/16  8:39  PM  Result Value Ref Range   Iron 18 (L) 45 - 182 ug/dL   TIBC 185 (L) 250 - 450 ug/dL   Saturation Ratios 10 (L) 17.9 - 39.5 %   UIBC 167 ug/dL  Phosphorus     Status: Abnormal   Collection Time: 12/05/16  8:39 PM  Result Value Ref Range   Phosphorus 6.1 (H) 2.5 - 4.6 mg/dL  Glucose, capillary     Status: None   Collection Time: 12/05/16  9:29 PM  Result Value Ref Range   Glucose-Capillary 81 65 - 99 mg/dL  Glucose, capillary     Status: None   Collection Time: 12/06/16  6:44 AM  Result Value Ref Range  Glucose-Capillary 95 65 - 99 mg/dL  Renal function panel     Status: Abnormal   Collection Time: 12/06/16  8:01 AM  Result Value Ref Range   Sodium 134 (L) 135 - 145 mmol/L   Potassium 4.1 3.5 - 5.1 mmol/L   Chloride 96 (L) 101 - 111 mmol/L   CO2 25 22 - 32 mmol/L   Glucose, Bld 88 65 - 99 mg/dL   BUN 61 (H) 6 - 20 mg/dL   Creatinine, Ser 7.46 (H) 0.61 - 1.24 mg/dL   Calcium 6.9 (L) 8.9 - 10.3 mg/dL   Phosphorus 7.6 (H) 2.5 - 4.6 mg/dL   Albumin 1.6 (L) 3.5 - 5.0 g/dL   GFR calc non Af Amer 8 (L) >60 mL/min   GFR calc Af Amer 9 (L) >60 mL/min   Anion gap 13 5 - 15  Type and screen [place]     Status: None (Preliminary result)   Collection Time: 12/06/16 11:02 AM  Result Value Ref Range   ABO/RH(D) A NEG    Antibody Screen NEG    Sample Expiration 12/09/2016    Unit Number [id number]    Blood Component Type RBC, LR IRR    Unit division 00    Status of Unit ISSUED    Transfusion Status OK TO TRANSFUSE    Crossmatch Result Compatible    Unit Number [id number]    Blood Component Type RED CELLS,LR    Unit division 00    Status of Unit ISSUED    Transfusion Status OK TO TRANSFUSE    Crossmatch Result Compatible   Prepare RBC     Status: None   Collection Time: 12/06/16 11:02 AM  Result Value Ref Range   Order Confirmation ORDER PROCESSED BY BLOOD BANK   CBC     Status: Abnormal   Collection Time: 12/06/16 11:18 AM  Result Value Ref Range    WBC 10.3 4.0 - 10.5 K/uL   RBC 2.55 (L) 4.22 - 5.81 MIL/uL   Hemoglobin 6.6 (LL) 13.0 - 17.0 g/dL   HCT 21.3 (L) 39.0 - 52.0 %   MCV 83.5 78.0 - 100.0 fL   MCH 25.9 (L) 26.0 - 34.0 pg   MCHC 31.0 30.0 - 36.0 g/dL   RDW 19.8 (H) 11.5 - 15.5 %   Platelets 498 (H) 150 - 400 K/uL  Prepare RBC     Status: None   Collection Time: 12/06/16 12:19 PM  Result Value Ref Range   Order Confirmation ORDER PROCESSED BY BLOOD BANK   Glucose, capillary     Status: None   Collection Time: 12/06/16  4:25 PM  Result Value Ref Range   Glucose-Capillary 76 65 - 99 mg/dL  Hemoglobin and hematocrit, blood     Status: Abnormal   Collection Time: 12/06/16  4:32 PM  Result Value Ref Range   Hemoglobin 8.7 (L) 13.0 - 17.0 g/dL   HCT 27.1 (L) 39.0 - 52.0 %   No results found.  Assessment/Plan: Diagnosis: Right AKA with encephalopathy Labs independently reviewed.  Records reviewed and summated above. Clean amputation daily with soap and water Monitor incision site for signs of infection or impending skin breakdown. Staples to remain in place for 3-4 weeks Stump shrinker, for edema control  Scar mobilization massaging to prevent soft tissue adherence Stump protector during therapies Prevent flexion contractures by implementing the following:   Encourage prone lying for 20-30 mins per day BID to avoid hip flexion  Contractures if medically  appropriate;  Avoid prolonged sitting Post surgical pain control with oral medication Phantom limb pain control with physical modalities including desensitization techniques (gentle self massage to the residual stump,hot packs if sensation intact, [place]) and mirror therapy, TENS. If ineffective, consider pharmacological treatment for neuropathic pain (e.g gabapentin, pregabalin, amytriptalyine, duloxetine).  Avoid injury to contralateral side  1. Does the need for close, 24 hr/day medical supervision in concert with the patient's rehab needs make it unreasonable for this  patient to be served in a less intensive setting? Yes 2. Co-Morbidities requiring supervision/potential complications: ESRD (HD per nephro), T2DM (Monitor in accordance with exercise and adjust meds as necessary), PVD s/p revascularization (recent CIR admission), tobacco abuse (counsel when appropriate), ABLA on chronic anemia (transfuse if necessary to ensure appropriate perfusion for increased activity tolerance), post-op pain (Biofeedback training with therapies to help reduce reliance on opiate pain medications, monitor pain control during therapies, and sedation at rest and titrate to maximum efficacy to ensure participation and gains in therapies) 3. Due to bowel management, safety, skin/wound care, disease management, medication administration, pain management and patient education, does the patient require 24 hr/day rehab nursing? Yes 4. Does the patient require coordinated care of a physician, rehab nurse, PT (1-2 hrs/day, 5 days/week), OT (1-2 hrs/day, 5 days/week) and SLP (1-2 hrs/day, 5 days/week) to address physical and functional deficits in the context of the above medical diagnosis(es)? Yes Addressing deficits in the following areas: balance, endurance, locomotion, strength, transferring, bathing, dressing, toileting, cognition and psychosocial support 5. Can the patient actively participate in an intensive therapy program of at least 3 hrs of therapy per day at least 5 days per week? Potentially 6. The potential for patient to make measurable gains while on inpatient rehab is excellent 7. Anticipated functional outcomes upon discharge from inpatient rehab are Likely supervision/Min A  with PT, Likely supervision/Min A with OT, independent with SLP. 8. Estimated rehab length of stay to reach the above functional goals is: 16-19 days. 9. Anticipated D/C setting: Home 10. Anticipated post D/C treatments: HH therapy and Home excercise program 11. Overall Rehab/Functional Prognosis:  good  RECOMMENDATIONS: This patient's condition is appropriate for continued rehabilitative care in the following setting: CIR when medically stable and able to participate in 3 hours therapy/day.. Patient has agreed to participate in recommended program. Potentially Note that insurance prior authorization may be required for reimbursement for recommended care.  Comment: Rehab Admissions Coordinator to follow up.  Delice Lesch, MD, Mellody Drown 12/07/16 Bary Leriche, PA-C 12/06/2016

## 2016-12-06 NOTE — Progress Notes (Signed)
PT Cancellation Note  Patient Details Name: KEYVIN RISON MRN: [id number] DOB: 04-03-1971   Cancelled Treatment:    Reason Eval/Treat Not Completed: Medical issues which prohibited therapy. Nurse with pt stating he is more confused and agitated than earlier.  She requested PT return later after pt has pain meds to see if this helps with agitation.  Will check back as schedule permits.   Galen Manila 12/06/2016, 10:03 AM

## 2016-12-06 NOTE — Evaluation (Signed)
Physical Therapy Evaluation Patient Details Name: Hayden [name] MRN: [id number] DOB: August 03, 1970 Today's Date: 12/06/2016   History of Present Illness  pt is a 46 y/o male with PMH significant for DM, ESRD on PD, stroke, revascularization of R LE, admitted with liquefied necrotic proximal tibial muscle and flexion contracture of R knee, s/p AKA.  Clinical Impression  Pt admitted with/for necrotic muscle and gangrenous ulcers R LE, s/p R AKA.  Pt appears to be in a stupor post HD and not able to focus so eval limited..  Pt currently limited functionally due to the problems listed. ( See problems list.)   Pt will benefit from PT to maximize function and safety in order to get ready for next venue listed below.     Follow Up Recommendations CIR    Equipment Recommendations       Recommendations for Other Services Rehab consult     Precautions / Restrictions Precautions Precautions: Fall      Mobility  Bed Mobility Overal bed mobility: Needs Assistance Bed Mobility: Supine to Sit;Sit to Supine     Supine to sit: Min assist Sit to supine: Min assist   General bed mobility comments: stability assist during movement.  multimodal cues to keep on task, help initiate.  Transfers                 General transfer comment: unable to get pt to attempt transfers.   Difficult to keep on task.  Ambulation/Gait                Stairs            Wheelchair Mobility    Modified Rankin (Stroke Patients Only)       Balance Overall balance assessment: Needs assistance   Sitting balance-Leahy Scale: Good Sitting balance - Comments: UE assist not required                                     Pertinent Vitals/Pain Pain Assessment: Faces Faces Pain Scale: Hurts whole lot Pain Location: R LE stump Pain Descriptors / Indicators: Grimacing;Guarding;Moaning Pain Intervention(s): Monitored during session;Limited activity within patient's tolerance     Home Living Family/patient expects to be discharged to:: Unsure Living Arrangements: Spouse/significant other (1 child) Available Help at Discharge: Family;Available 24 hours/day;Other (Comment) (son available this summer) Type of Home: Mobile home Home Access: Stairs to enter Entrance Stairs-Rails: Right;Left;Can reach both Entrance Stairs-Number of Steps: 4 (working on a ramp) Home Layout: One level Home Equipment: Cane - single point;Grab bars - tub/shower;Hand held shower head      Prior Function Level of Independence: Needs assistance   Gait / Transfers Assistance Needed: stayed in w/c lately           Hand Dominance   Dominant Hand: Right    Extremity/Trunk Assessment   Upper Extremity Assessment Upper Extremity Assessment: Generalized weakness    Lower Extremity Assessment Lower Extremity Assessment: RLE deficits/detail;LLE deficits/detail RLE Deficits / Details: moves against gravity, but not tested due to pain RLE: Unable to fully assess due to pain RLE Coordination: decreased fine motor LLE Deficits / Details: generally functional, but not tested.    Cervical / Trunk Assessment Cervical / Trunk Assessment: Kyphotic  Communication      Cognition Arousal/Alertness: Awake/alert Behavior During Therapy: Flat affect Overall Cognitive Status: Impaired/Different from baseline Area of Impairment: Following commands;Attention;Safety/judgement;Awareness  Current Attention Level: Sustained   Following Commands: Follows one step commands with increased time;Follows one step commands inconsistently Safety/Judgement: Decreased awareness of safety;Decreased awareness of deficits Awareness: Intellectual Problem Solving: Slow processing;Decreased initiation;Requires verbal cues;Requires tactile cues        General Comments General comments (skin integrity, edema, etc.): pt participated minimally.  No verbalization. generally in a  stupor.  Not sure if pt was being stubborn or unable to focus on task.    Exercises     Assessment/Plan    PT Assessment Patient needs continued PT services  PT Problem List Decreased strength;Decreased activity tolerance;Decreased balance;Decreased mobility;Decreased coordination;Decreased knowledge of use of DME;Pain       PT Treatment Interventions DME instruction;Gait training;Functional mobility training;Therapeutic activities;Balance training;Patient/family education;Wheelchair mobility training    PT Goals (Current goals can be found in the Care Plan section)  Acute Rehab PT Goals Patient Stated Goal: pt unable to participate PT Goal Formulation: Patient unable to participate in goal setting Time For Goal Achievement: 12/20/16 Potential to Achieve Goals: Good    Frequency Min 3X/week   Barriers to discharge Inaccessible home environment      Co-evaluation               AM-PAC PT "6 Clicks" Daily Activity  Outcome Measure Difficulty turning over in bed (including adjusting bedclothes, sheets and blankets)?: Total Difficulty moving from lying on back to sitting on the side of the bed? : Total Difficulty sitting down on and standing up from a chair with arms (e.g., wheelchair, bedside commode, etc,.)?: Total Help needed moving to and from a bed to chair (including a wheelchair)?: A Little Help needed walking in hospital room?: A Little Help needed climbing 3-5 steps with a railing? : A Little 6 Click Score: 12    End of Session   Activity Tolerance: Patient limited by pain;Patient limited by lethargy Patient left: in bed;with call bell/phone within reach;with bed alarm set;with family/visitor present Nurse Communication: Mobility status PT Visit Diagnosis: Difficulty in walking, not elsewhere classified (R26.2);Pain Pain - Right/Left: Right Pain - part of body: Leg    Time: 1549-1620 PT Time Calculation (min) (ACUTE ONLY): 31 min   Charges:   PT  Evaluation $PT Eval Moderate Complexity: 1 Procedure PT Treatments $Therapeutic Activity: 8-22 mins   PT G Codes:        2016/12/30  Donnella Sham, PT [phone]  (pager)  Tessie Fass Shaneese Tait 2016/12/30, 4:55 PM

## 2016-12-06 NOTE — Clinical Social Work Note (Signed)
Clinical Social Work Assessment  Patient Details  Name: Hayden [name] MRN: [id number] Date of Birth: 03/07/1971  Date of referral:  12/06/16               Reason for consult:  Facility Placement                Permission sought to share information with:  Facility Contact Representative Permission granted to share information::  No (pt disoriented- wife is next of kin)  Name::     Kimberly  Agency::  [facility]  Relationship::  wife  Contact Information:     Housing/Transportation Living arrangements for the past 2 months:  Single Family Home Source of Information:  Spouse Patient Interpreter Needed:    Criminal Activity/Legal Involvement Pertinent to Current Situation/Hospitalization:    Significant Relationships:    Lives with:    Do you feel safe going back to the place where you live?    Need for family participation in patient care:     Care giving concerns:  Pt lives at home with spouse who works- patient with increased impairment following AKA and increased care needs would not be manageable at this time.   Social Worker assessment / plan:  CSW met with pt and pt mother-in-law at bedside.  Pt had just receive medication and was not oriented and very lethargic- did not respond to questions. Spoke briefly with pt mother-in-law concerning rehab options.  CSW called pt wife and explained SNF recommendation if CIR not an option.  Wife is very hopeful that patient can return to CIR where he had previous rehab stay.  CSW explained that CIR may not be an option for patient at DC and that SNF is next recommendation.  Explained that we would need to determine dialysis center for hemodialysis and that we would look for SNF options for patient for short term rehab.  Employment status:    Insurance information:    PT Recommendations:    Information / Referral to community resources:     Patient/Family's Response to care:  Wife is agreeable to SNF if CIR is not an option- was initially  hesitant due to concerns it would turn into long term placement but agreeable after further discussion.  Patient/Family's Understanding of and Emotional Response to Diagnosis, Current Treatment, and Prognosis:  Pt wife is very involved with pt care and understanding of current condition.  Wife wondering if patient will need another rehab stay after he receives prothesis.  Emotional Assessment Appearance:    Attitude/Demeanor/Rapport:  Lethargic Affect (typically observed):    Orientation:  Oriented to Self Alcohol / Substance use:  Not Applicable Psych involvement (Current and /or in the community):  No (Comment)  Discharge Needs  Concerns to be addressed:  Care Coordination Readmission within the last 30 days:  No Current discharge risk:  Physical Impairment Barriers to Discharge:  Continued Medical Work up   ,  H, LCSW 12/06/2016, 1:22 PM  

## 2016-12-06 NOTE — Progress Notes (Signed)
Initial Nutrition Assessment  DOCUMENTATION CODES:   Not applicable  INTERVENTION:  Provide Nepro Shake po BID, each supplement provides 425 kcal and 19 grams protein.  Encourage adequate PO intake.   NUTRITION DIAGNOSIS:   Increased nutrient needs related to chronic illness, wound healing as evidenced by estimated needs.  GOAL:   Patient will meet greater than or equal to 90% of their needs  MONITOR:   PO intake, Supplement acceptance, Labs, Weight trends, Skin, I & O's  REASON FOR ASSESSMENT:   Low Braden    ASSESSMENT:   46 year old gentleman with a history of diabetes currently patient states he is not on medication for diabetes he has end-stage renal disease on peritoneal dialysis daily. Patient is status post revascularization with Dr. Bridgett Larsson for the right lower extremity he presents at this time with liquefied necrotic proximal tibial muscle a gangrenous ulcer of the left great toe and a flexion contraction of the right knee.  PROCEDURE (6/6):  RIGHT ABOVE KNEE AMPUTATION Application of Prevena wound VAC.  Pt transitioning over to HD. Meal completion 50% this AM. Pt reports having a good appetite currently and PTA with usual consumption of at least 3 meals a day with a protein shake 1-2 times daily. Pt with a 12% weight loss in 3 months, however weight loss likely related to fluid status. RD to order nutritional supplements to aid in caloric and protein needs as well as in wound healing. Noted phosphorous elevated. RD to order Nepro Shake as it is low in potassium and phosphorous.   Unable to complete Nutrition-Focused physical exam at this time.   Labs and medications reviewed. Phosphorous elevated at 7.6.  Diet Order:  Diet renal with fluid restriction Fluid restriction: 1200 mL Fluid; Room service appropriate? Yes; Fluid consistency: Thin  Skin:  Wound (see comment) (Incision R leg with wound VAC)  Last BM:  6/5  Height:   Ht Readings from Last 1 Encounters:   12/02/16 5\' 10"  (1.778 m)    Weight:   Wt Readings from Last 1 Encounters:  12/06/16 185 lb 10 oz (84.2 kg)    Ideal Body Weight:  69 kg (adjusted for R AKA)  BMI:  Body mass index is 26.63 kg/m.  Estimated Nutritional Needs:   Kcal:  2200-2400  Protein:  110-120 grams  Fluid:  1.2 L/day  EDUCATION NEEDS:   No education needs identified at this time  Corrin Parker, MS, RD, LDN Pager # [phone] After hours/ weekend pager # [phone]

## 2016-12-06 NOTE — Progress Notes (Signed)
Subjective:   Seen at bedside. Alert, but not talking much.  [name] present. Says he was more alert earlier this am sitting up in bed eating and talking. Says saw dramatic improvement post HD yesterday. Likely going to SNF   Objective Vital signs in last 24 hours: Vitals:   12/05/16 1903 12/05/16 1906 12/05/16 2028 12/06/16 0545  BP: (!) 181/85 (!) 183/87 119/68 (!) 154/54  Pulse: 84 81 82 76  Resp: 18  17 18   Temp: 97.2 F (36.2 C)  98.6 F (37 C) 98.1 F (36.7 C)  TempSrc:   Oral Oral  SpO2:   94% 95%  Weight: 87 kg (191 lb 12.8 oz)      Weight change: 0.363 kg (12.8 oz)  Physical Exam: General: Alert, but not answering questions  Heart: RRR  2/6 systolic ejection murmur no rub  Lungs: CTA nonlabored Abdomen: bs pos soft NT , ND Extremities: no pedal edema Left lower extrem / R AKA  wound vac in place Dialysis Access: pd cath / pos bruit AVF LFA    Dialysis:[facility]. Last admit  [facility]PD: 3L fill 5 exchanges at night with 5th fill manually drained 1-1/2 hours later. Uses mostly 1's and 2's at home.  Problem/Plan: 1. AMS - Seems to have improved post HD - Post op ?pain meds last pm vs anesthesia  / vs uremia secondary to inadqeuate home dialysis pre admission BUN improved - will plan another HD treatment today  2. ESRD -  [facility]PD --->HD Plan transition to HD with likely d/c to SNF Will start CLIP process  3. HTN/volume - BP ok  no excess vol on exam / home meds listed 4 bp meds =(was on with last admit)=Catapres patch 0.2mg  / Amlodipine 10mg   Qd, hydralazine 100 mg q 8hrs, Labetalol 300 mg bid/  meds on hold with decreased Mentation / will hold today  4. SP R AKA Wound Vac -  rx per Dr. Sharol Given /    5. Anemia - hgb 7.1  Aranesp 200 subcut q Thursday / plan transfuse 1u prbc with HD today  6. Metabolic bone disease -   Yest Corec ca 8.5  on  Calcitriol/  Cinacalcet/ Auryxia binder  7. Nutrition - alb 1.9  Use carb mod renal diet and protein supplement (use Ensure  dislikes Nepro in past  8. DM type 2 - no meds diet only  Glu 91 Secondary hyperparathyroidism -   Lynnda Child PA-C [facility] Pager [phone] 12/06/2016,9:28 AM   Labs: Basic Metabolic Panel:  Recent Labs Lab 12/04/16 0743 12/05/16 1121 12/05/16 2039 12/06/16 0801  NA 134* 135  --  134*  K 4.6 4.2  --  4.1  CL 94* 96*  --  96*  CO2 19* 21*  --  25  GLUCOSE 91 92  --  88  BUN 126* 109*  --  61*  CREATININE 12.02* 10.59*  --  7.46*  CALCIUM 7.4* 6.8*  --  6.9*  PHOS  --  11.5* 6.1* 7.6*   Liver Function Tests:  Recent Labs Lab 12/04/16 0743 12/05/16 1121 12/06/16 0801  AST 21  --   --   ALT 23  --   --   ALKPHOS 234*  --   --   BILITOT 0.7  --   --   PROT 5.6*  --   --   ALBUMIN 1.9* 1.5* 1.6*   No results for input(s): LIPASE, AMYLASE in the last 168 hours. No results  for input(s): AMMONIA in the last 168 hours. CBC:  Recent Labs Lab 12/04/16 0743 12/05/16 1121  WBC 12.2* 11.2*  HGB 8.1* 7.1*  HCT 26.1* 22.7*  MCV 82.3 82.5  PLT 640* 537*   Cardiac Enzymes: No results for input(s): CKTOTAL, CKMB, CKMBINDEX, TROPONINI in the last 168 hours. CBG:  Recent Labs Lab 12/04/16 2138 12/05/16 0622 12/05/16 1140 12/05/16 2129 12/06/16 0644  GLUCAP 99 142* 93 81 95    Studies/Results: No results found. Medications: . sodium chloride 10 mL/hr at 12/04/16 1718  . methocarbamol (ROBAXIN)  IV     . amLODipine  10 mg Oral Daily  . aspirin EC  325 mg Oral Daily  . calcitRIOL  0.5 mcg Oral Daily  . cinacalcet  60 mg Oral QPC supper  . cloNIDine  0.2 mg Transdermal Q Thu  . darbepoetin (ARANESP) injection - NON-DIALYSIS  200 mcg Subcutaneous Q Thu-1800  . divalproex  500 mg Oral Q12H  . docusate sodium  100 mg Oral BID  . escitalopram  10 mg Oral Daily  . ferric citrate  420 mg Oral TID WC  . gabapentin  100 mg Oral TID  . gentamicin cream  1 application Topical Daily  . hydrALAZINE  100 mg Oral Q8H  . isosorbide dinitrate   5 mg Oral BID  . labetalol  300 mg Oral BID  . pantoprazole  40 mg Oral Daily  . pravastatin  20 mg Oral q1800

## 2016-12-07 DIAGNOSIS — Z89611 Acquired absence of right leg above knee: Secondary | ICD-10-CM

## 2016-12-07 DIAGNOSIS — Z720 Tobacco use: Secondary | ICD-10-CM

## 2016-12-07 DIAGNOSIS — Z992 Dependence on renal dialysis: Secondary | ICD-10-CM

## 2016-12-07 DIAGNOSIS — D62 Acute posthemorrhagic anemia: Secondary | ICD-10-CM

## 2016-12-07 DIAGNOSIS — E119 Type 2 diabetes mellitus without complications: Secondary | ICD-10-CM

## 2016-12-07 DIAGNOSIS — N186 End stage renal disease: Secondary | ICD-10-CM

## 2016-12-07 DIAGNOSIS — G8918 Other acute postprocedural pain: Secondary | ICD-10-CM

## 2016-12-07 DIAGNOSIS — D638 Anemia in other chronic diseases classified elsewhere: Secondary | ICD-10-CM

## 2016-12-07 LAB — BPAM RBC
BLOOD PRODUCT EXPIRATION DATE: 201806162359
Blood Product Expiration Date: 201806152359
ISSUE DATE / TIME: 201806081228
ISSUE DATE / TIME: 201806081228
UNIT TYPE AND RH: 600
UNIT TYPE AND RH: 600

## 2016-12-07 LAB — TYPE AND SCREEN
ABO/RH(D): A NEG
ANTIBODY SCREEN: NEGATIVE
Unit division: 0
Unit division: 0

## 2016-12-07 MED ORDER — HEPARIN SODIUM (PORCINE) 1000 UNIT/ML DIALYSIS
1000.0000 [IU] | INTRAMUSCULAR | Status: DC | PRN
  Filled 2016-12-07: qty 1

## 2016-12-07 MED ORDER — LIDOCAINE-PRILOCAINE 2.5-2.5 % EX CREA
1.0000 "application " | TOPICAL_CREAM | CUTANEOUS | Status: DC | PRN
  Filled 2016-12-07: qty 5

## 2016-12-07 MED ORDER — LIDOCAINE HCL (PF) 1 % IJ SOLN: 5.0000 mL | INTRAMUSCULAR | Status: DC | PRN

## 2016-12-07 MED ORDER — SODIUM CHLORIDE 0.9 % IV SOLN: 100.0000 mL | INTRAVENOUS | Status: DC | PRN

## 2016-12-07 MED ORDER — PENTAFLUOROPROP-TETRAFLUOROETH EX AERO: 1.0000 "application " | INHALATION_SPRAY | CUTANEOUS | Status: DC | PRN

## 2016-12-07 NOTE — Progress Notes (Signed)
Subjective:   Tired today - HD yesterday no issues  Not talking much - oriented to place and person No new c/os   Objective Vital signs in last 24 hours: Vitals:   12/06/16 1532 12/06/16 2006 12/07/16 0100 12/07/16 0431  BP: (!) 172/70 (!) 160/65  (!) 155/67  Pulse: 70 76  77  Resp:      Temp:  99.1 F (37.3 C)  98.7 F (37.1 C)  TempSrc:  Oral  Oral  SpO2: 95% 95%  93%  Weight:      Height:   5\' 10"  (1.778 m)    Weight change: -2.3 kg (-5 lb 1.1 oz)  Physical Exam: General: Alert, but not answering questions  Heart: RRR  2/6 systolic ejection murmur no rub  Lungs: CTA nonlabored Abdomen: bs + soft NT , ND Extremities: R AKA  wound vac in place 2+ stump edema  Dialysis Access: pd cath / pos bruit AVF LFA    Dialysis:[facility]. Last admit  [facility]PD: 3L fill 5 exchanges at night with 5th fill manually drained 1-1/2 hours later. Uses mostly 1's and 2's at home.  Problem/Plan: 1. AMS - Seems to have improved post HD - Post op ?pain meds last pm vs anesthesia  / vs uremia secondary to inadqeuate home dialysis pre admission BUN improved 2. ESRD -  [facility]PD --->HD Plan transition to HD with likely d/c to SNF  CLIP process started/ Plan short HD today keep on TTS schedule  3. HTN- BP ok / cont home meds- clonidine patch 0.2mg  / Amlodipine 10mg   Qd, hydralazine 100 mg q 8hrs, Labetalol 300 mg bid/   4. Volume - stump edema on exam / needs to establish new EDW - post HD wt 84.2kg bed weight unable to stand  -UF goal 2L today  5. Gangrene SP R AKA 6/6 - Wound Vac per Dr. Sharol Given  6. Anemia - hgb 8.7 s/p 2U prbcs with HD 6/9 -  Aranesp 200 subcut q Thursday  7. Metabolic bone disease -  Cont Calcitriol Cinacalcet/ Auryxia binder  8. Nutrition - PCM alb 1.9  Use carb mod renal diet and protein supplement (use Ensure dislikes Nepro in past) 9. DM type 2 - no meds diet only     Lynnda Child PA-C [place] Pager [phone] 12/07/2016,10:18 AM  Pt seen, examined  and agree w A/P as above.  Kelly Splinter MD [facility] pager [phone]   12/07/2016, 1:56 PM     Labs: Basic Metabolic Panel:  Recent Labs Lab 12/04/16 0743 12/05/16 1121 12/05/16 2039 12/06/16 0801  NA 134* 135  --  134*  K 4.6 4.2  --  4.1  CL 94* 96*  --  96*  CO2 19* 21*  --  25  GLUCOSE 91 92  --  88  BUN 126* 109*  --  61*  CREATININE 12.02* 10.59*  --  7.46*  CALCIUM 7.4* 6.8*  --  6.9*  PHOS  --  11.5* 6.1* 7.6*   Liver Function Tests:  Recent Labs Lab 12/04/16 0743 12/05/16 1121 12/06/16 0801  AST 21  --   --   ALT 23  --   --   ALKPHOS 234*  --   --   BILITOT 0.7  --   --   PROT 5.6*  --   --   ALBUMIN 1.9* 1.5* 1.6*   No results for input(s): LIPASE, AMYLASE in the last 168 hours. No results for input(s):  AMMONIA in the last 168 hours. CBC:  Recent Labs Lab 12/04/16 0743 12/05/16 1121 12/06/16 1118 12/06/16 1632  WBC 12.2* 11.2* 10.3  --   HGB 8.1* 7.1* 6.6* 8.7*  HCT 26.1* 22.7* 21.3* 27.1*  MCV 82.3 82.5 83.5  --   PLT 640* 537* 498*  --    Cardiac Enzymes: No results for input(s): CKTOTAL, CKMB, CKMBINDEX, TROPONINI in the last 168 hours. CBG:  Recent Labs Lab 12/05/16 1140 12/05/16 2129 12/06/16 0644 12/06/16 1625 12/06/16 2010  GLUCAP 93 81 95 76 73    Studies/Results: No results found. Medications: . sodium chloride 10 mL/hr at 12/04/16 1718  . sodium chloride    . methocarbamol (ROBAXIN)  IV     . amLODipine  10 mg Oral Daily  . aspirin EC  325 mg Oral Daily  . calcitRIOL  0.5 mcg Oral Daily  . cinacalcet  60 mg Oral QPC supper  . cloNIDine  0.2 mg Transdermal Q Thu  . darbepoetin (ARANESP) injection - NON-DIALYSIS  200 mcg Subcutaneous Q Thu-1800  . divalproex  500 mg Oral Q12H  . docusate sodium  100 mg Oral BID  . escitalopram  10 mg Oral Daily  . feeding supplement (NEPRO CARB STEADY)  237 mL Oral BID BM  . ferric citrate  420 mg Oral TID WC  . gabapentin  100 mg Oral TID  . gentamicin  cream  1 application Topical Daily  . hydrALAZINE  100 mg Oral Q8H  . isosorbide dinitrate  5 mg Oral BID  . labetalol  300 mg Oral BID  . pantoprazole  40 mg Oral Daily  . pravastatin  20 mg Oral q1800

## 2016-12-08 NOTE — Progress Notes (Signed)
Subjective:   Seems more alert today Answering questions HD yesterday net UF 3L   Objective Vital signs in last 24 hours: Vitals:   12/07/16 1721 12/07/16 1913 12/07/16 2053 12/08/16 0446  BP: (!) 189/86 (!) 168/80 (!) 177/83 (!) 179/85  Pulse: 80  86 77  Resp: 18  18 20   Temp: 99 F (37.2 C)  99.1 F (37.3 C) 98.6 F (37 C)  TempSrc: Oral  Oral Oral  SpO2: 95%  94% 93%  Weight:      Height:       Weight change: 0.3 kg (10.6 oz)  Physical Exam: General: Alert, lying in bed NAD  Heart: RRR  2/6 systolic ejection murmur no rub  Lungs: CTA nonlabored Abdomen: bs + soft NT , ND Extremities: R AKA  wound vac in place 2+ stump edema  Dialysis Access: PD cath in place / LUE AVF +bruit  Dialysis:[facility]. Last admit  [facility]PD: 3L fill 5 exchanges at night with 5th fill manually drained 1-1/2 hours later. Uses mostly 1's and 2's at home.  Problem/Plan: 1. AMS - Seems to have improved post HD -?pain meds vs uremia secondary to inadqeuate home dialysis pre admission-  BUN improved on HD No plans for resuming PD for now, pt was underdialyzed on admission and uremic, now responding to HD.  Pt's wife is assuming he will go back to PD upon dc home (after rehab stay) but not sure that will be best for him; when the time comes will d/w primary nephrologist 2. ESRD -  [facility]PD --->HD Plan transition to HD with likely d/c to SNF  CLIP process started/ Cont on TTS schedule  3. HTN- BP variable / cont home meds- clonidine patch 0.2mg  / Amlodipine 10mg   Qd, hydralazine 100 mg q 8hrs, Labetalol 300 mg bid/   4. Volume - stump edema on exam / needs to establish new EDW - post HD wt 83 kg by bed weight -cont volume removal as tolerated  5. 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 Sharol Given  6. Anemia - hgb 8.7 s/p 2U prbcs with HD 6/9 -  Aranesp 200 subcut q Thursday  7. Metabolic bone disease -  Cont Calcitriol Cinacalcet/ Auryxia binder  8. Nutrition - PCM alb 1.9  Use carb mod renal  diet and protein supplement (use Ensure dislikes Nepro in past) 9. DM type 2 - no meds diet only   10. Dispo - CIR when medically stable    Lynnda Child PA-C [place] Pager [phone] 12/08/2016,9:37 AM  Pt seen, examined, agree w assess/plan as above with additions as indicated.  Kelly Splinter MD [facility] pager [phone]    cell [phone] 12/08/2016, 12:03 PM        Labs: Basic Metabolic Panel:  Recent Labs Lab 12/04/16 0743 12/05/16 1121 12/05/16 2039 12/06/16 0801  NA 134* 135  --  134*  K 4.6 4.2  --  4.1  CL 94* 96*  --  96*  CO2 19* 21*  --  25  GLUCOSE 91 92  --  88  BUN 126* 109*  --  61*  CREATININE 12.02* 10.59*  --  7.46*  CALCIUM 7.4* 6.8*  --  6.9*  PHOS  --  11.5* 6.1* 7.6*   Liver Function Tests:  Recent Labs Lab 12/04/16 0743 12/05/16 1121 12/06/16 0801  AST 21  --   --   ALT 23  --   --  ALKPHOS 234*  --   --   BILITOT 0.7  --   --   PROT 5.6*  --   --   ALBUMIN 1.9* 1.5* 1.6*   No results for input(s): LIPASE, AMYLASE in the last 168 hours. No results for input(s): AMMONIA in the last 168 hours. CBC:  Recent Labs Lab 12/04/16 0743 12/05/16 1121 12/06/16 1118 12/06/16 1632  WBC 12.2* 11.2* 10.3  --   HGB 8.1* 7.1* 6.6* 8.7*  HCT 26.1* 22.7* 21.3* 27.1*  MCV 82.3 82.5 83.5  --   PLT 640* 537* 498*  --    Cardiac Enzymes: No results for input(s): CKTOTAL, CKMB, CKMBINDEX, TROPONINI in the last 168 hours. CBG:  Recent Labs Lab 12/05/16 1140 12/05/16 2129 12/06/16 0644 12/06/16 1625 12/06/16 2010  GLUCAP 93 81 95 76 73    Studies/Results: No results found. Medications: . sodium chloride 10 mL/hr at 12/04/16 1718  . sodium chloride    . sodium chloride    . sodium chloride    . methocarbamol (ROBAXIN)  IV     . amLODipine  10 mg Oral Daily  . aspirin EC  325 mg Oral Daily  . calcitRIOL  0.5 mcg Oral Daily  . cinacalcet  60 mg Oral QPC supper  . cloNIDine  0.2 mg  Transdermal Q Thu  . darbepoetin (ARANESP) injection - NON-DIALYSIS  200 mcg Subcutaneous Q Thu-1800  . divalproex  500 mg Oral Q12H  . docusate sodium  100 mg Oral BID  . escitalopram  10 mg Oral Daily  . feeding supplement (NEPRO CARB STEADY)  237 mL Oral BID BM  . ferric citrate  420 mg Oral TID WC  . gabapentin  100 mg Oral TID  . gentamicin cream  1 application Topical Daily  . hydrALAZINE  100 mg Oral Q8H  . isosorbide dinitrate  5 mg Oral BID  . labetalol  300 mg Oral BID  . pantoprazole  40 mg Oral Daily  . pravastatin  20 mg Oral q1800

## 2016-12-08 NOTE — Progress Notes (Signed)
     Subjective: 4 Days Post-Op Procedure(s) (LRB): RIGHT ABOVE KNEE AMPUTATION (Right) Awake, alert and oriented x 4. He is resting comfortably, VAC right AKA intact and suction intact.  Patient reports pain as moderate.    Objective:   VITALS:  Temp:  [98 F (36.7 C)-99.1 F (37.3 C)] 98.6 F (37 C) (06/10 0446) Pulse Rate:  [71-86] 77 (06/10 0446) Resp:  [17-20] 20 (06/10 0446) BP: (113-189)/(61-86) 179/85 (06/10 0446) SpO2:  [93 %-96 %] 93 % (06/10 0446) Weight:  [182 lb 15.7 oz (83 kg)-187 lb 6.3 oz (85 kg)] 182 lb 15.7 oz (83 kg) (06/09 1621)  Neurologically intact ABD soft Neurovascular intact Sensation intact distally Incision: no drainage and scant drainage   LABS  Recent Labs  12/05/16 1121 12/06/16 1118 12/06/16 1632  HGB 7.1* 6.6* 8.7*  WBC 11.2* 10.3  --   PLT 537* 498*  --     Recent Labs  12/05/16 1121 12/06/16 0801  NA 135 134*  K 4.2 4.1  CL 96* 96*  CO2 21* 25  BUN 109* 61*  CREATININE 10.59* 7.46*  GLUCOSE 92 88   No results for input(s): LABPT, INR in the last 72 hours.   Assessment/Plan: 4 Days Post-Op Procedure(s) (LRB): RIGHT ABOVE KNEE AMPUTATION (Right)  Advance diet Up with therapy Plan for discharge tomorrow or Tuesday.  [name] 12/08/2016, 10:54 AM Patient ID: Hayden [name], male   DOB: Jan 15, 1971, 46 y.o.   MRN: [id number]

## 2016-12-09 ENCOUNTER — Encounter (HOSPITAL_COMMUNITY): Payer: Self-pay

## 2016-12-09 ENCOUNTER — Inpatient Hospital Stay (HOSPITAL_COMMUNITY)
Admission: RE | Admit: 2016-12-09 | Discharge: 2016-12-14 | DRG: 559 | Disposition: A | Discharge status: Home Health | Payer: Medicare Other | Source: Intra-hospital | Attending: Physical Medicine & Rehabilitation | Admitting: Physical Medicine & Rehabilitation

## 2016-12-09 DIAGNOSIS — I10 Essential (primary) hypertension: Secondary | ICD-10-CM | POA: Diagnosis not present

## 2016-12-09 DIAGNOSIS — N186 End stage renal disease: Secondary | ICD-10-CM | POA: Diagnosis present

## 2016-12-09 DIAGNOSIS — G8929 Other chronic pain: Secondary | ICD-10-CM | POA: Diagnosis present

## 2016-12-09 DIAGNOSIS — Z79899 Other long term (current) drug therapy: Secondary | ICD-10-CM

## 2016-12-09 DIAGNOSIS — I169 Hypertensive crisis, unspecified: Secondary | ICD-10-CM | POA: Diagnosis not present

## 2016-12-09 DIAGNOSIS — N2581 Secondary hyperparathyroidism of renal origin: Secondary | ICD-10-CM | POA: Diagnosis present

## 2016-12-09 DIAGNOSIS — E1142 Type 2 diabetes mellitus with diabetic polyneuropathy: Secondary | ICD-10-CM

## 2016-12-09 DIAGNOSIS — E1152 Type 2 diabetes mellitus with diabetic peripheral angiopathy with gangrene: Secondary | ICD-10-CM | POA: Diagnosis present

## 2016-12-09 DIAGNOSIS — I132 Hypertensive heart and chronic kidney disease with heart failure and with stage 5 chronic kidney disease, or end stage renal disease: Secondary | ICD-10-CM | POA: Diagnosis present

## 2016-12-09 DIAGNOSIS — D62 Acute posthemorrhagic anemia: Secondary | ICD-10-CM | POA: Diagnosis present

## 2016-12-09 DIAGNOSIS — Z89611 Acquired absence of right leg above knee: Secondary | ICD-10-CM

## 2016-12-09 DIAGNOSIS — E1122 Type 2 diabetes mellitus with diabetic chronic kidney disease: Secondary | ICD-10-CM | POA: Diagnosis present

## 2016-12-09 DIAGNOSIS — I110 Hypertensive heart disease with heart failure: Secondary | ICD-10-CM

## 2016-12-09 DIAGNOSIS — Z85528 Personal history of other malignant neoplasm of kidney: Secondary | ICD-10-CM

## 2016-12-09 DIAGNOSIS — E785 Hyperlipidemia, unspecified: Secondary | ICD-10-CM | POA: Diagnosis present

## 2016-12-09 DIAGNOSIS — E43 Unspecified severe protein-calorie malnutrition: Secondary | ICD-10-CM | POA: Diagnosis present

## 2016-12-09 DIAGNOSIS — Z6827 Body mass index (BMI) 27.0-27.9, adult: Secondary | ICD-10-CM

## 2016-12-09 DIAGNOSIS — Z8249 Family history of ischemic heart disease and other diseases of the circulatory system: Secondary | ICD-10-CM

## 2016-12-09 DIAGNOSIS — R5381 Other malaise: Secondary | ICD-10-CM | POA: Diagnosis present

## 2016-12-09 DIAGNOSIS — F05 Delirium due to known physiological condition: Secondary | ICD-10-CM | POA: Diagnosis present

## 2016-12-09 DIAGNOSIS — I160 Hypertensive urgency: Secondary | ICD-10-CM | POA: Diagnosis not present

## 2016-12-09 DIAGNOSIS — I5042 Chronic combined systolic (congestive) and diastolic (congestive) heart failure: Secondary | ICD-10-CM | POA: Diagnosis present

## 2016-12-09 DIAGNOSIS — E1151 Type 2 diabetes mellitus with diabetic peripheral angiopathy without gangrene: Secondary | ICD-10-CM

## 2016-12-09 DIAGNOSIS — Z4781 Encounter for orthopedic aftercare following surgical amputation: Secondary | ICD-10-CM | POA: Diagnosis present

## 2016-12-09 DIAGNOSIS — Z992 Dependence on renal dialysis: Secondary | ICD-10-CM

## 2016-12-09 DIAGNOSIS — F17210 Nicotine dependence, cigarettes, uncomplicated: Secondary | ICD-10-CM | POA: Diagnosis present

## 2016-12-09 DIAGNOSIS — F329 Major depressive disorder, single episode, unspecified: Secondary | ICD-10-CM | POA: Diagnosis present

## 2016-12-09 DIAGNOSIS — Z8673 Personal history of transient ischemic attack (TIA), and cerebral infarction without residual deficits: Secondary | ICD-10-CM

## 2016-12-09 DIAGNOSIS — I150 Renovascular hypertension: Secondary | ICD-10-CM | POA: Diagnosis not present

## 2016-12-09 DIAGNOSIS — M792 Neuralgia and neuritis, unspecified: Secondary | ICD-10-CM

## 2016-12-09 DIAGNOSIS — Z905 Acquired absence of kidney: Secondary | ICD-10-CM

## 2016-12-09 DIAGNOSIS — Z716 Tobacco abuse counseling: Secondary | ICD-10-CM

## 2016-12-09 DIAGNOSIS — E669 Obesity, unspecified: Secondary | ICD-10-CM | POA: Diagnosis present

## 2016-12-09 DIAGNOSIS — G546 Phantom limb syndrome with pain: Secondary | ICD-10-CM

## 2016-12-09 DIAGNOSIS — Z885 Allergy status to narcotic agent status: Secondary | ICD-10-CM

## 2016-12-09 DIAGNOSIS — R7303 Prediabetes: Secondary | ICD-10-CM

## 2016-12-09 DIAGNOSIS — Z7982 Long term (current) use of aspirin: Secondary | ICD-10-CM

## 2016-12-09 DIAGNOSIS — Z9049 Acquired absence of other specified parts of digestive tract: Secondary | ICD-10-CM | POA: Diagnosis not present

## 2016-12-09 DIAGNOSIS — I5032 Chronic diastolic (congestive) heart failure: Secondary | ICD-10-CM

## 2016-12-09 LAB — CBC
HEMATOCRIT: 29.7 % — AB (ref 39.0–52.0)
HEMOGLOBIN: 9.1 g/dL — AB (ref 13.0–17.0)
MCH: 26.3 pg (ref 26.0–34.0)
MCHC: 30.6 g/dL (ref 30.0–36.0)
MCV: 85.8 fL (ref 78.0–100.0)
Platelets: 555 10*3/uL — ABNORMAL HIGH (ref 150–400)
RBC: 3.46 MIL/uL — ABNORMAL LOW (ref 4.22–5.81)
RDW: 18.7 % — AB (ref 11.5–15.5)
WBC: 13.1 10*3/uL — ABNORMAL HIGH (ref 4.0–10.5)

## 2016-12-09 LAB — RENAL FUNCTION PANEL
ANION GAP: 10 (ref 5–15)
Albumin: 1.6 g/dL — ABNORMAL LOW (ref 3.5–5.0)
BUN: 33 mg/dL — ABNORMAL HIGH (ref 6–20)
CHLORIDE: 99 mmol/L — AB (ref 101–111)
CO2: 26 mmol/L (ref 22–32)
Calcium: 7 mg/dL — ABNORMAL LOW (ref 8.9–10.3)
Creatinine, Ser: 6.4 mg/dL — ABNORMAL HIGH (ref 0.61–1.24)
GFR calc Af Amer: 11 mL/min — ABNORMAL LOW (ref >60)
GFR calc non Af Amer: 10 mL/min — ABNORMAL LOW (ref >60)
GLUCOSE: 106 mg/dL — AB (ref 65–99)
POTASSIUM: 3.5 mmol/L (ref 3.5–5.1)
Phosphorus: 5 mg/dL — ABNORMAL HIGH (ref 2.5–4.6)
Sodium: 135 mmol/L (ref 135–145)

## 2016-12-09 MED ORDER — HYDROCODONE-ACETAMINOPHEN 10-325 MG PO TABS
1.0000 | ORAL_TABLET | ORAL | Status: DC | PRN
  Administered 2016-12-09 – 2016-12-11 (×8): 2 via ORAL
  Administered 2016-12-11: 1 via ORAL
  Administered 2016-12-12 – 2016-12-14 (×7): 2 via ORAL
  Filled 2016-12-09 (×17): qty 2

## 2016-12-09 MED ORDER — LABETALOL HCL 300 MG PO TABS
300.0000 mg | ORAL_TABLET | Freq: Two times a day (BID) | ORAL | Status: DC
  Administered 2016-12-09 – 2016-12-14 (×10): 300 mg via ORAL
  Filled 2016-12-09 (×10): qty 1

## 2016-12-09 MED ORDER — FERRIC CITRATE 1 GM 210 MG(FE) PO TABS
420.0000 mg | ORAL_TABLET | Freq: Three times a day (TID) | ORAL | Status: DC
  Administered 2016-12-09 – 2016-12-14 (×13): 420 mg via ORAL
  Filled 2016-12-09 (×17): qty 2

## 2016-12-09 MED ORDER — SORBITOL 70 % SOLN: 30.0000 mL | Freq: Every day | Status: DC | PRN

## 2016-12-09 MED ORDER — PRO-STAT SUGAR FREE PO LIQD
30.0000 mL | Freq: Two times a day (BID) | ORAL | Status: DC
  Filled 2016-12-09: qty 30

## 2016-12-09 MED ORDER — AMLODIPINE BESYLATE 10 MG PO TABS
10.0000 mg | ORAL_TABLET | Freq: Every day | ORAL | Status: DC
  Administered 2016-12-10 – 2016-12-14 (×5): 10 mg via ORAL
  Filled 2016-12-09 (×5): qty 1

## 2016-12-09 MED ORDER — DIVALPROEX SODIUM 125 MG PO CSDR
500.0000 mg | DELAYED_RELEASE_CAPSULE | Freq: Two times a day (BID) | ORAL | Status: DC
  Administered 2016-12-09 – 2016-12-14 (×10): 500 mg via ORAL
  Filled 2016-12-09 (×11): qty 4

## 2016-12-09 MED ORDER — PRAVASTATIN SODIUM 20 MG PO TABS
20.0000 mg | ORAL_TABLET | Freq: Every day | ORAL | Status: DC
  Administered 2016-12-09 – 2016-12-13 (×3): 20 mg via ORAL
  Filled 2016-12-09 (×4): qty 1

## 2016-12-09 MED ORDER — LORAZEPAM 1 MG PO TABS
1.0000 mg | ORAL_TABLET | Freq: Four times a day (QID) | ORAL | Status: DC | PRN
  Administered 2016-12-13: 1 mg via ORAL
  Filled 2016-12-09: qty 1

## 2016-12-09 MED ORDER — NEPRO/CARBSTEADY PO LIQD
237.0000 mL | Freq: Two times a day (BID) | ORAL | Status: DC
  Administered 2016-12-10: 237 mL via ORAL

## 2016-12-09 MED ORDER — CALCITRIOL 0.5 MCG PO CAPS
0.5000 ug | ORAL_CAPSULE | Freq: Every day | ORAL | Status: DC
  Administered 2016-12-10 – 2016-12-14 (×6): 0.5 ug via ORAL
  Filled 2016-12-09 (×5): qty 1

## 2016-12-09 MED ORDER — CLONIDINE HCL 0.2 MG/24HR TD PTWK
0.2000 mg | MEDICATED_PATCH | TRANSDERMAL | Status: DC
  Filled 2016-12-09: qty 1

## 2016-12-09 MED ORDER — ACETAMINOPHEN 325 MG PO TABS: 650.0000 mg | ORAL_TABLET | Freq: Four times a day (QID) | ORAL | Status: DC | PRN

## 2016-12-09 MED ORDER — BISACODYL 10 MG RE SUPP: 10.0000 mg | Freq: Every day | RECTAL | Status: DC | PRN

## 2016-12-09 MED ORDER — DARBEPOETIN ALFA 200 MCG/0.4ML IJ SOSY
200.0000 ug | PREFILLED_SYRINGE | INTRAMUSCULAR | Status: DC
  Filled 2016-12-09: qty 0.4

## 2016-12-09 MED ORDER — ISOSORBIDE DINITRATE 5 MG PO TABS
5.0000 mg | ORAL_TABLET | Freq: Two times a day (BID) | ORAL | Status: DC
  Administered 2016-12-09 – 2016-12-14 (×10): 5 mg via ORAL
  Filled 2016-12-09 (×12): qty 1

## 2016-12-09 MED ORDER — GABAPENTIN 100 MG PO CAPS
100.0000 mg | ORAL_CAPSULE | Freq: Three times a day (TID) | ORAL | Status: DC
  Administered 2016-12-09 – 2016-12-14 (×14): 100 mg via ORAL
  Filled 2016-12-09 (×14): qty 1

## 2016-12-09 MED ORDER — ESCITALOPRAM OXALATE 10 MG PO TABS
10.0000 mg | ORAL_TABLET | Freq: Every day | ORAL | Status: DC
  Administered 2016-12-10 – 2016-12-14 (×5): 10 mg via ORAL
  Filled 2016-12-09 (×5): qty 1

## 2016-12-09 MED ORDER — HEPARIN SODIUM (PORCINE) 1000 UNIT/ML DIALYSIS: 1000.0000 [IU] | INTRAMUSCULAR | Status: DC | PRN

## 2016-12-09 MED ORDER — METHOCARBAMOL 1000 MG/10ML IJ SOLN
500.0000 mg | Freq: Four times a day (QID) | INTRAVENOUS | Status: DC | PRN
  Filled 2016-12-09: qty 5

## 2016-12-09 MED ORDER — ACETAMINOPHEN 650 MG RE SUPP: 650.0000 mg | Freq: Four times a day (QID) | RECTAL | Status: DC | PRN

## 2016-12-09 MED ORDER — HYDRALAZINE HCL 50 MG PO TABS
100.0000 mg | ORAL_TABLET | Freq: Three times a day (TID) | ORAL | Status: DC
  Administered 2016-12-09 – 2016-12-14 (×13): 100 mg via ORAL
  Filled 2016-12-09 (×14): qty 2

## 2016-12-09 MED ORDER — CINACALCET HCL 30 MG PO TABS
60.0000 mg | ORAL_TABLET | Freq: Every day | ORAL | Status: DC
  Administered 2016-12-09 – 2016-12-13 (×4): 60 mg via ORAL
  Filled 2016-12-09 (×5): qty 2

## 2016-12-09 MED ORDER — POLYETHYLENE GLYCOL 3350 17 G PO PACK: 17.0000 g | PACK | Freq: Every day | ORAL | Status: DC | PRN

## 2016-12-09 MED ORDER — PENTAFLUOROPROP-TETRAFLUOROETH EX AERO: 1.0000 "application " | INHALATION_SPRAY | CUTANEOUS | Status: DC | PRN

## 2016-12-09 MED ORDER — METHOCARBAMOL 500 MG PO TABS
500.0000 mg | ORAL_TABLET | Freq: Four times a day (QID) | ORAL | Status: DC | PRN
  Administered 2016-12-10 – 2016-12-12 (×3): 500 mg via ORAL
  Filled 2016-12-09 (×4): qty 1

## 2016-12-09 MED ORDER — PANTOPRAZOLE SODIUM 40 MG PO TBEC
40.0000 mg | DELAYED_RELEASE_TABLET | Freq: Every day | ORAL | Status: DC
  Administered 2016-12-10 – 2016-12-14 (×5): 40 mg via ORAL
  Filled 2016-12-09 (×5): qty 1

## 2016-12-09 MED ORDER — DOCUSATE SODIUM 100 MG PO CAPS
100.0000 mg | ORAL_CAPSULE | Freq: Two times a day (BID) | ORAL | Status: DC
  Administered 2016-12-09 – 2016-12-14 (×10): 100 mg via ORAL
  Filled 2016-12-09 (×10): qty 1

## 2016-12-09 MED ORDER — ASPIRIN EC 325 MG PO TBEC
325.0000 mg | DELAYED_RELEASE_TABLET | Freq: Every day | ORAL | Status: DC
  Administered 2016-12-10 – 2016-12-14 (×5): 325 mg via ORAL
  Filled 2016-12-09 (×5): qty 1

## 2016-12-09 NOTE — Progress Notes (Signed)
Patient ID: Hayden [name], male   DOB: December 21, 1970, 46 y.o.   MRN: [id number] Patient states that he is ready for discharge to inpatient rehabilitation. Status post right above-the-knee amputation. Orders written for discharge to inpatient rehabilitation.

## 2016-12-09 NOTE — Progress Notes (Signed)
Cristina Gong, RN Rehab Admission Coordinator Signed Physical Medicine and Rehabilitation  PMR Pre-admission Date of Service: 12/09/2016 11:29 AM  Related encounter: Admission (Discharged) from 12/04/2016 in [place]       [] Hide copied text PMR Admission Coordinator Pre-Admission Assessment  Patient: Hayden [name] is an 46 y.o., male MRN: [id number] DOB: [birth year] Height: 5\' 10"  (177.8 cm) Weight: 83.4 kg (183 lb 13.8 oz)                                                                                                                                                  Insurance Information HMO:     PPO:      PCP:      IPA:      80/20: yes     OTHER: no HMO PRIMARY: medicare a and b      Policy#: [id number]      Subscriber: pt Benefits:  Phone #: passport one online     Name: 12/09/2016 Eff. Date: a 09/29/93 b 06/01/2015     Deduct: $1340      Out of Pocket Max: none      Life Max: none CIR: 100%      SNF: 20 full days Outpatient: 80%     Co-Pay: 20% Home Health: 100%      Co-Pay: none DME: 80%     Co-Pay: 20% Providers: pt choice  SECONDARY: [employer]       Policy#: [id number]      Subscriber: pt  Medicaid Application Date:       Case Manager:  Disability Application Date:       Case Worker:   Emergency Contact Information        Contact Information    Name Relation Home Work Mobile   Hayden [name] Spouse  [phone]   Hayden [name], Hayden [name]   [phone]     Current Medical History  Patient Admitting Diagnosis:  Right AKA  History of Present Illness: HPI: Hayden [name] a 46 y.o.malewith history ofdiastolic congestive heart failure, tobacco abuse,ESRD, T2DM, PVD s/p revascularization (recent CIR admission05/23/2018-11/28/2016 for debilitation after right first digit amputation lower extremity). Admitted 12/04/16 with necrotic changes right tibial muscle and flexion contracture at kneeas well as  recent fall on incision lineand underwent R-AKA by Dr. Duda06/11/2016. Wound VAC as directed.  Post op has had issues with confusion and lethargy due to encephalopathy. Mentation improved post HD and was able to participate in therapy, however, appears to wax/wane. Hospital course complicated by post-op pain, leukocytosis, ABLA. CIR recommended due to decline in functional mobility.  Past Medical History      Past Medical History:  Diagnosis Date  . Anemia March 2014  . Cancer ([place])    Kidney  . CHF (congestive heart failure) ([place]) 9935   Acute systolic  and diastolic CHF  . Depression    & rage --  was in counseling....great now  . Diabetes mellitus    NO DM SINCE LOST 130LBS  . ESRD on peritoneal dialysis ([place]) 2018   Started in-center HD approx 2015 for 2 years, then did about 1 year of home HD and then started peritoneal dialysis in early 2018.    . Gangrene ([place])    right leg and foot  . History of kidney cancer   . Hyperlipidemia   . Hypertension   . Obesity   . PONV (postoperative nausea and vomiting)   . Stroke [facility])     Family History  family history includes Deep vein thrombosis in his father; Heart attack in his father; Heart disease in his mother; Other in his other.  Prior Rehab/Hospitalizations:  Has the patient had major surgery during 100 days prior to admission? Yes  Recent CIR admit for 8 days 10/2016  Current Medications   Current Facility-Administered Medications:  .  0.9 %  sodium chloride infusion, , Intravenous, Continuous, Newt Minion, MD, Last Rate: 10 mL/hr at 12/04/16 1718 .  0.9 %  sodium chloride infusion, , Intravenous, Once, Ejigiri, Ogechi Grace, PA-C .  0.9 %  sodium chloride infusion, 100 mL, Intravenous, PRN, Ejigiri, Thomos Lemons, PA-C .  0.9 %  sodium chloride infusion, 100 mL, Intravenous, PRN, Ejigiri, Thomos Lemons, PA-C .  acetaminophen (TYLENOL) tablet 650 mg, 650 mg, Oral, Q6H PRN **OR** acetaminophen  (TYLENOL) suppository 650 mg, 650 mg, Rectal, Q6H PRN, Newt Minion, MD .  amLODipine (NORVASC) tablet 10 mg, 10 mg, Oral, Daily, Newt Minion, MD, 10 mg at 12/09/16 0831 .  aspirin EC tablet 325 mg, 325 mg, Oral, Daily, Newt Minion, MD, 325 mg at 12/09/16 7564 .  bisacodyl (DULCOLAX) suppository 10 mg, 10 mg, Rectal, Daily PRN, Newt Minion, MD .  calcitRIOL (ROCALTROL) capsule 0.5 mcg, 0.5 mcg, Oral, Daily, Newt Minion, MD, 0.5 mcg at 12/09/16 0831 .  cinacalcet (SENSIPAR) tablet 60 mg, 60 mg, Oral, QPC supper, Newt Minion, MD, 60 mg at 12/08/16 1703 .  cloNIDine (CATAPRES - Dosed in mg/24 hr) patch 0.2 mg, 0.2 mg, Transdermal, Q Thu, Newt Minion, MD .  Darbepoetin Alfa (ARANESP) injection 200 mcg, 200 mcg, Subcutaneous, Q Thu-1800, Ernest Haber, PA-C, 200 mcg at 12/05/16 2012 .  divalproex (DEPAKOTE SPRINKLE) capsule 500 mg, 500 mg, Oral, Q12H, Newt Minion, MD, 500 mg at 12/09/16 0831 .  docusate sodium (COLACE) capsule 100 mg, 100 mg, Oral, BID, Newt Minion, MD, 100 mg at 12/07/16 2108 .  escitalopram (LEXAPRO) tablet 10 mg, 10 mg, Oral, Daily, Newt Minion, MD, 10 mg at 12/09/16 0831 .  feeding supplement (NEPRO CARB STEADY) liquid 237 mL, 237 mL, Oral, BID BM, Newt Minion, MD .  ferric citrate (AURYXIA) tablet 420 mg, 420 mg, Oral, TID WC, Newt Minion, MD, 420 mg at 12/09/16 3329 .  gabapentin (NEURONTIN) capsule 100 mg, 100 mg, Oral, TID, Newt Minion, MD, 100 mg at 12/09/16 0830 .  gentamicin cream (GARAMYCIN) 0.1 % 1 application, 1 application, Topical, Daily PRN, Newt Minion, MD, 1 application at 51/88/41 2250 .  gentamicin cream (GARAMYCIN) 0.1 % 1 application, 1 application, Topical, Daily, Sanford, Ryan B, MD, 1 application at 66/06/30 1041 .  heparin injection 1,000 Units, 1,000 Units, Dialysis, PRN, Lynnda Child, PA-C .  hydrALAZINE (APRESOLINE) tablet 100 mg, 100 mg, Oral, Q8H,  Newt Minion, MD, 100 mg at 12/09/16 0555 .   HYDROcodone-acetaminophen (NORCO) 10-325 MG per tablet 1-2 tablet, 1-2 tablet, Oral, Q4H PRN, Newt Minion, MD, 2 tablet at 12/08/16 1822 .  isosorbide dinitrate (ISORDIL) tablet 5 mg, 5 mg, Oral, BID, Newt Minion, MD, 5 mg at 12/09/16 0829 .  labetalol (NORMODYNE) tablet 300 mg, 300 mg, Oral, BID, Newt Minion, MD, 300 mg at 12/09/16 0829 .  lidocaine (PF) (XYLOCAINE) 1 % injection 5 mL, 5 mL, Intradermal, PRN, Ejigiri, Thomos Lemons, PA-C .  lidocaine-prilocaine (EMLA) cream 1 application, 1 application, Topical, PRN, Ejigiri, Thomos Lemons, PA-C .  LORazepam (ATIVAN) tablet 1 mg, 1 mg, Oral, Q6H PRN, Newt Minion, MD, 1 mg at 12/08/16 2128 .  methocarbamol (ROBAXIN) tablet 500 mg, 500 mg, Oral, Q6H PRN, 500 mg at 12/08/16 2127 **OR** methocarbamol (ROBAXIN) 500 mg in dextrose 5 % 50 mL IVPB, 500 mg, Intravenous, Q6H PRN, Newt Minion, MD .  metoCLOPramide (REGLAN) tablet 5-10 mg, 5-10 mg, Oral, Q8H PRN **OR** metoCLOPramide (REGLAN) injection 5-10 mg, 5-10 mg, Intravenous, Q8H PRN, Newt Minion, MD .  ondansetron (ZOFRAN) tablet 4 mg, 4 mg, Oral, Q6H PRN **OR** ondansetron (ZOFRAN) injection 4 mg, 4 mg, Intravenous, Q6H PRN, Newt Minion, MD .  pantoprazole (PROTONIX) EC tablet 40 mg, 40 mg, Oral, Daily, Newt Minion, MD, 40 mg at 12/09/16 0829 .  pentafluoroprop-tetrafluoroeth (GEBAUERS) aerosol 1 application, 1 application, Topical, PRN, Ejigiri, Thomos Lemons, PA-C .  polyethylene glycol (MIRALAX / GLYCOLAX) packet 17 g, 17 g, Oral, Daily PRN, Newt Minion, MD .  pravastatin (PRAVACHOL) tablet 20 mg, 20 mg, Oral, q1800, Newt Minion, MD, 20 mg at 12/08/16 1703  Patients Current Diet: Diet renal with fluid restriction Fluid restriction: 1200 mL Fluid; Room service appropriate? Yes; Fluid consistency: Thin Diet - low sodium heart healthy  Precautions / Restrictions Precautions Precautions: Fall Restrictions Weight Bearing Restrictions: Yes RLE Weight Bearing: Non weight  bearing   Has the patient had 2 or more falls or a fall with injury in the past year?No  Prior Activity Level Limited Community (1-2x/wk): limited for recent d/c from CIR home with [facility]. Was on CCPD. Pt has been on hemodialysis at home as well as CCPD at home in the past several years  Development worker, international aid / [place] Devices/Equipment: Wheelchair Home Equipment: Cane - single point, Grab bars - tub/shower, Hand held shower head  Prior Device Use: Indicate devices/aids used by the patient prior to current illness, exacerbation or injury? Manual wheelchair  Prior Functional Level Prior Function Level of Independence: Needs assistance Gait / Transfers Assistance Needed: stayed in w/c lately Comments: wife assisted with home CCPD  Self Care: Did the patient need help bathing, dressing, using the toilet or eating?  Needed some help  Indoor Mobility: Did the patient need assistance with walking from room to room (with or without device)? Needed some help  Stairs: Did the patient need assistance with internal or external stairs (with or without device)? Needed some help  Functional Cognition: Did the patient need help planning regular tasks such as shopping or remembering to take medications? Needed some help  Current Functional Level Cognition  Overall Cognitive Status: Impaired/Different from baseline Current Attention Level: Sustained Orientation Level: Oriented to person Following Commands: Follows one step commands with increased time, Follows one step commands inconsistently Safety/Judgement: Decreased awareness of safety, Decreased awareness of deficits    Extremity Assessment (includes Sensation/Coordination)  Upper Extremity Assessment: Generalized weakness  Lower Extremity Assessment: RLE deficits/detail, LLE deficits/detail RLE Deficits / Details: moves against gravity, but not tested due to pain RLE: Unable to fully assess due to pain RLE  Coordination: decreased fine motor LLE Deficits / Details: generally functional, but not tested.    ADLs       Mobility  Overal bed mobility: Needs Assistance Bed Mobility: Supine to Sit, Sit to Supine Supine to sit: Min assist Sit to supine: Min assist General bed mobility comments: stability assist during movement.  multimodal cues to keep on task, help initiate.    Transfers  General transfer comment: unable to get pt to attempt transfers.   Difficult to keep on task.    Ambulation / Gait / Stairs / Office manager / Balance Dynamic Sitting Balance Sitting balance - Comments: UE assist not required Balance Overall balance assessment: Needs assistance Sitting balance-Leahy Scale: Good Sitting balance - Comments: UE assist not required    Special needs/care consideration BiPAP/CPAP  N/a CPM  N/a Continuous Drip IV  N/a Dialysis yes hemodialysis     DaysTues, Thurs, Sat. Recent switch from CCPD this admission Life Vest  N/a Oxygen  N/a Special Bed  N/a Trach Size  N/a Wound Vac yes     Location right AKA site Skin  Peritoneal dialysis catheter site with gauze dressing; Moisture associated  Skin change to buttocks; ecchymosis right abdomen and hip Bowel mgmt: LBM 6/10 incontinent Bladder mgmt: continent Diabetic mgmt n/a   Previous Home Environment Living Arrangements: Spouse/significant other, Children (50 year old son, Hayden [name])  Lives With: Spouse, Son Available Help at Discharge: Family, Available 24 hours/day (son recent [employer] graduate with no plans for the summet) Type of Home: Mobile home Home Layout: One level Home Access: Stairs to enter Entrance Stairs-Rails: Right, Left, Can reach both Entrance Stairs-Number of Steps: 4 Bathroom Shower/Tub: Gaffer, Door [employer] Toilet: Standard Bathroom Accessibility: Yes How Accessible: Accessible via wheelchair, Accessible via walker Home Care Services: Yes Type of Home Care  Services: Home OT, [facility] (if known): [facility]  Discharge Living Setting Plans for Discharge Living Setting: Patient's home, Lives with (comment) (spouse and 58 year old son) Type of Home at Discharge: Mobile home Discharge Home Layout: One level Discharge Home Access: Stairs to enter Entrance Stairs-Rails: Right, Left Entrance Stairs-Number of Steps: 3-4 Discharge Bathroom Shower/Tub: Tub only, Walk-in shower Discharge Bathroom Toilet: Standard Discharge Bathroom Accessibility: Yes How Accessible: Accessible via wheelchair, Accessible via walker Does the patient have any problems obtaining your medications?: No  Social/Family/Support Systems Patient Roles: Spouse, Parent Contact Information: Hayden [name], wife Anticipated Caregiver: [name] and children Anticipated Ambulance person Information: see above Ability/Limitations of Caregiver: wife works 42st and 2nd shifts; 65 year old son out of school for the summer Caregiver Availability: 24/7 Discharge Plan Discussed with Primary Caregiver: Yes Is Caregiver In Agreement with Plan?: Yes Does Caregiver/Family have Issues with Lodging/Transportation while Pt is in Rehab?: No  Goals/Additional Needs Patient/Family Goal for Rehab: supervision to min assist with PT and OT, supervision to Mod I with SLP Expected length of stay: ELOS 16-19 days Dietary Needs: renal diet, fluid restrictions, thin liquids Special Service Needs: Pt was on CCPD pta, but has been switched to hemodialysis Tues, Thurs, and Saturday since admission. CLIP process for outpt dialysis underway Pt/Family Agrees to Admission and willing to participate: Yes Program Orientation Provided & Reviewed with Pt/Caregiver Including Roles  &  Responsibilities: Yes  Decrease burden of Care through IP rehab admission: n/a  Possible need for SNF placement upon discharge: not anticipated  Patient Condition: This patient's condition remains as documented  in the consult dated 12/07/2016, in which the Rehabilitation Physician determined and documented that the patient's condition is appropriate for intensive rehabilitative care in an inpatient rehabilitation facility. Will admit to inpatient rehab today.  Preadmission Screen Completed By:  Cleatrice Burke, 12/09/2016 11:39 AM ______________________________________________________________________   Discussed status with Dr. Posey Pronto on 12/09/2016 at  1139 and received telephone approval for admission today.  Admission Coordinator:  Cleatrice Burke, time 8184 Date 6/11/ 2018       Cosigned by: [name], MD at 12/09/2016 11:54 AM  Revision History

## 2016-12-09 NOTE — Discharge Summary (Signed)
Discharge Diagnoses:  Active Problems:   Above knee amputation of right lower extremity (HCC)   Unilateral AKA, right ([place])   ESRD on dialysis [facility])   Surgeries: Procedure(s): RIGHT ABOVE KNEE AMPUTATION on 12/04/2016    Consultants: Treatment Team:  Rexene Agent, MD  Discharged Condition: Improved  Hospital Course: Hayden [name] is an 46 y.o. male who was admitted 12/04/2016 with a chief complaint of gangrene right leg and foot, with a final diagnosis of Gangrene Right Leg and Foot.  Patient was brought to the operating room on 12/04/2016 and underwent Procedure(s): RIGHT ABOVE KNEE AMPUTATION.    Patient was given perioperative antibiotics: Anti-infectives    Start     Dose/Rate Route Frequency Ordered Stop   12/04/16 1830  ceFAZolin (ANCEF) IVPB 1 g/50 mL premix  Status:  Discontinued     1 g 100 mL/hr over 30 Minutes Intravenous Every 8 hours 12/04/16 1734 12/04/16 2016   12/04/16 1345  ceFAZolin (ANCEF) IVPB 1 g/50 mL premix  Status:  Discontinued     1 g 100 mL/hr over 30 Minutes Intravenous Every 6 hours 12/04/16 1333 12/04/16 1338   12/04/16 1000  ceFAZolin (ANCEF) IVPB 2g/100 mL premix     2 g 200 mL/hr over 30 Minutes Intravenous To [facility]Stay [facility] 12/03/16 1349 12/04/16 1049    .  Patient was given sequential compression devices, early ambulation, and aspirin for DVT prophylaxis.  Recent vital signs: Patient Vitals for the past 24 hrs:  BP Temp Temp src Pulse Resp SpO2 Weight  12/09/16 0500 (!) 173/73 97.4 F (36.3 C) Oral 76 16 95 % -  12/08/16 2011 (!) 155/66 98.4 F (36.9 C) Oral 78 17 95 % -  12/08/16 1700 - - - - - - 183 lb 13.8 oz (83.4 kg)  12/08/16 1055 (!) 160/71 98.8 F (37.1 C) Oral 88 17 97 % -  .  Recent laboratory studies: No results found.  Discharge Medications:   Allergies as of 12/09/2016      Reactions   Dilaudid [hydromorphone Hcl] Other (See Comments)   ABNORMAL BEHAVIORS "VERBALLY AND PHYSICALLY ABUSIVE" PSYCHOSIS   Morphine And  Related Itching, Other (See Comments)      Medication List    STOP taking these medications   collagenase ointment Commonly known as:  SANTYL     TAKE these medications   amLODipine 10 MG tablet Commonly known as:  NORVASC Take 1 tablet (10 mg total) by mouth daily.   aspirin EC 81 MG tablet Take 81 mg by mouth daily.   calcitRIOL 0.5 MCG capsule Commonly known as:  ROCALTROL Take 1 capsule (0.5 mcg total) by mouth daily.   cinacalcet 60 MG tablet Commonly known as:  SENSIPAR Take 1 tablet (60 mg total) by mouth daily.   cloNIDine 0.2 mg/24hr patch Commonly known as:  CATAPRES - Dosed in mg/24 hr Place 1 patch (0.2 mg total) onto the skin once a week. What changed:  when to take this   divalproex 125 MG capsule Commonly known as:  DEPAKOTE SPRINKLE Take 4 capsules (500 mg total) by mouth every 12 (twelve) hours.   docusate sodium 100 MG capsule Commonly known as:  COLACE Take 1 capsule (100 mg total) by mouth daily.   escitalopram 10 MG tablet Commonly known as:  LEXAPRO Take 1 tablet (10 mg total) by mouth daily.   ferric citrate 1 GM 210 MG(Fe) tablet Commonly known as:  AURYXIA Take 2 tablets (420 mg total) by mouth  3 (three) times daily with meals.   gabapentin 100 MG capsule Commonly known as:  NEURONTIN Take 1 capsule (100 mg total) by mouth 3 (three) times daily. What changed:  when to take this  additional instructions   gentamicin cream 0.1 % Commonly known as:  GARAMYCIN Apply 1 application topically daily as needed (tube cleaning).   hydrALAZINE 100 MG tablet Commonly known as:  APRESOLINE Take 1 tablet (100 mg total) by mouth every 8 (eight) hours.   HYDROcodone-acetaminophen 5-325 MG tablet Commonly known as:  NORCO/VICODIN Take 1-2 tablets by mouth every 6 (six) hours as needed for moderate pain.   isosorbide dinitrate 5 MG tablet Commonly known as:  ISORDIL Take 1 tablet (5 mg total) by mouth 2 (two) times daily.   labetalol 300  MG tablet Commonly known as:  NORMODYNE Take 1 tablet (300 mg total) by mouth 2 (two) times daily.   LORazepam 1 MG tablet Commonly known as:  ATIVAN Take 1 tablet (1 mg total) by mouth every 6 (six) hours as needed for anxiety or sleep. What changed:  when to take this   multivitamin Tabs tablet Take 1 tablet by mouth at bedtime.   omeprazole 40 MG capsule Commonly known as:  PRILOSEC Take 1 capsule (40 mg total) by mouth daily.   pravastatin 20 MG tablet Commonly known as:  PRAVACHOL Take 1 tablet (20 mg total) by mouth daily.   saccharomyces boulardii 250 MG capsule Commonly known as:  FLORASTOR Take 1 capsule (250 mg total) by mouth 2 (two) times daily.   Vitamin D (Ergocalciferol) 50000 units Caps capsule Commonly known as:  DRISDOL Take 50,000 Units by mouth every Thursday.       Diagnostic Studies: No results found.  Patient benefited maximally from their hospital stay and there were no complications.     Disposition: 01-Home or Self Care Discharge Instructions    Call MD / Call 911    Complete by:  As directed    If you experience chest pain or shortness of breath, CALL 911 and be transported to the hospital emergency room.  If you develope a fever above 101 F, pus (white drainage) or increased drainage or redness at the wound, or calf pain, call your surgeon's office.   Constipation Prevention    Complete by:  As directed    Drink plenty of fluids.  Prune juice may be helpful.  You may use a stool softener, such as Colace (over the counter) 100 mg twice a day.  Use MiraLax (over the counter) for constipation as needed.   Diet - low sodium heart healthy    Complete by:  As directed    Increase activity slowly as tolerated    Complete by:  As directed    Negative Pressure Wound Therapy - Incisional    Complete by:  As directed    At discharge to inpatient rehabilitation disc neck the hospital wound VAC and attached to the small Prevena wound VAC portable pump      Follow-up Information    Newt Minion, MD Follow up in 1 week(s).   Specialty:  Orthopedic Surgery Contact information: [address] [place] [address] [phone]            Signed: Newt Minion 12/09/2016, 6:48 AM

## 2016-12-09 NOTE — H&P (Signed)
Physical Medicine and Rehabilitation Admission H&P    Chief complaint: Stump pain  HPI: Hayden [name] is a 46 y.o. male with history of diastolic congestive heart failure, tobacco abuse, ESRD, T2DM, PVD s/p revascularization (recent CIR admission 11/20/2016-11/28/2016 for debilitation after right first digit amputation lower extremity). Patient lives with spouse mobile home with 4 steps to entry. Wife works a swing shift.  Admitted 12/04/16 with necrotic changes right tibial muscle and flexion contracture at knee as well as recent fall on incision line and underwent R-AKA by Dr. Sharol Given 12/04/2016. Wound VAC as directed. History taken from chart review.  Post op has had issues with confusion and lethargy due to encephalopathy. Mentation improved post HD and was able to participate in therapy, however, appears to wax/wane.  Hospital course complicated by post-op pain, leukocytosis, ABLA. CIR recommended due to decline in functional mobility. Patient was admitted for comprehensive rehabilitation program  Review of Systems  Constitutional: Negative for chills and fever.  HENT: Negative for hearing loss.   Eyes: Negative for blurred vision and double vision.  Respiratory: Positive for shortness of breath. Negative for cough.   Cardiovascular: Positive for leg swelling.  Gastrointestinal: Positive for constipation. Negative for nausea and vomiting.  Genitourinary: Negative for dysuria, flank pain and hematuria.  Musculoskeletal: Positive for joint pain and myalgias.  Skin: Negative for rash.  Neurological: Negative for seizures.  Psychiatric/Behavioral: Positive for depression.       Anxiety  All other systems reviewed and are negative.  Past Medical History:  Diagnosis Date  . Anemia March 2014  . Cancer ([place])    Kidney  . CHF (congestive heart failure) ([place]) 6734   Acute systolic and diastolic CHF  . Depression    & rage --  was in counseling....great now  . Diabetes mellitus    NO  DM SINCE LOST 130LBS  . ESRD on peritoneal dialysis ([place]) 2018   Started in-center HD approx 2015 for 2 years, then did about 1 year of home HD and then started peritoneal dialysis in early 2018.    . Gangrene ([place])    right leg and foot  . History of kidney cancer   . Hyperlipidemia   . Hypertension   . Obesity   . PONV (postoperative nausea and vomiting)   . Stroke [facility])    Past Surgical History:  Procedure Laterality Date  . ABDOMINAL AORTOGRAM W/LOWER EXTREMITY N/A 10/28/2016   Procedure: Abdominal Aortogram w/Lower Extremity;  Surgeon: Angelia Mould, MD;  Location: [place] [facility];  Service: Cardiovascular;  Laterality: N/A;  . AMPUTATION Right 11/05/2016   Procedure: AMPUTATION RIGHT FIRST RAY;  Surgeon: Conrad [place], MD;  Location: [place];  Service: Vascular;  Laterality: Right;  . AMPUTATION Right 12/04/2016   Procedure: RIGHT ABOVE KNEE AMPUTATION;  Surgeon: Newt Minion, MD;  Location: [place];  Service: Orthopedics;  Laterality: Right;  . AV FISTULA PLACEMENT Left 01/26/2013   Procedure: ARTERIOVENOUS (AV) FISTULA CREATION - LEFT RADIAL CEPHALIC AVF;  Surgeon: Angelia Mould, MD;  Location: [place];  Service: Vascular;  Laterality: Left;  . CHOLECYSTECTOMY  11/06/2015   Procedure: LAPAROSCOPIC CHOLECYSTECTOMY;  Surgeon: Ralene Ok, MD;  Location: [place];  Service: General;;  . FEMORAL-POPLITEAL BYPASS GRAFT Right 11/05/2016   Procedure: BYPASS GRAFT FEMORAL-POPLITEAL ARTERY USING NON-REVERSED RIGHT GREATER SAPPHENOUS VEIN;  Surgeon: Conrad [place], MD;  Location: [place];  Service: Vascular;  Laterality: Right;  . HERNIA REPAIR    . LOWER  EXTREMITY ANGIOGRAM Right 10/30/2016   Procedure: Right  LOWER EXTREMITY ANGIOGRAM WITH RIGHT SUPERFICIAL FEMORAL ARTERY balloon angioplasty;  Surgeon: Conrad [place], MD;  Location: [place];  Service: Vascular;  Laterality: Right;  . NEPHRECTOMY Right 2008   partial  . TESTICLE TORSION REDUCTION    . TONSILLECTOMY AND ADENOIDECTOMY    .  VEIN HARVEST Right 11/05/2016   Procedure: RIGHT GREATER SAPPHENOUS VEIN HARVEST;  Surgeon: Conrad , MD;  Location: [facility] OR;  Service: Vascular;  Laterality: Right;   Family History  Problem Relation Age of Onset  . Heart disease Mother        Heart Disease before age 73  . Deep vein thrombosis Father   . Heart attack Father   . Other Other    Social History:  reports that he has been smoking Cigarettes.  He has a 2.50 pack-year smoking history. He has never used smokeless tobacco. He reports that he uses drugs, including Marijuana. He reports that he does not drink alcohol. Allergies:  Allergies  Allergen Reactions  . Dilaudid [Hydromorphone Hcl] Other (See Comments)    ABNORMAL BEHAVIORS "VERBALLY AND PHYSICALLY ABUSIVE" PSYCHOSIS  . Morphine And Related Itching and Other (See Comments)   Medications Prior to Admission  Medication Sig Dispense Refill  . amLODipine (NORVASC) 10 MG tablet Take 1 tablet (10 mg total) by mouth daily. 30 tablet 0  . aspirin EC 81 MG tablet Take 81 mg by mouth daily.    . calcitRIOL (ROCALTROL) 0.5 MCG capsule Take 1 capsule (0.5 mcg total) by mouth daily. 30 capsule 0  . cinacalcet (SENSIPAR) 60 MG tablet Take 1 tablet (60 mg total) by mouth daily. 30 tablet 0  . cloNIDine (CATAPRES - DOSED IN MG/24 HR) 0.2 mg/24hr patch Place 1 patch (0.2 mg total) onto the skin once a week. (Patient taking differently: Place 0.2 mg onto the skin every Thursday. ) 4 patch 12  . divalproex (DEPAKOTE SPRINKLE) 125 MG capsule Take 4 capsules (500 mg total) by mouth every 12 (twelve) hours. 60 capsule 0  . escitalopram (LEXAPRO) 10 MG tablet Take 1 tablet (10 mg total) by mouth daily. 30 tablet 0  . ferric citrate (AURYXIA) 1 GM 210 MG(Fe) tablet Take 2 tablets (420 mg total) by mouth 3 (three) times daily with meals. 270 tablet 0  . gabapentin (NEURONTIN) 100 MG capsule Take 1 capsule (100 mg total) by mouth 3 (three) times daily. (Patient taking differently: Take 100 mg  by mouth 2 (two) times daily. May take an additional 129m daily as needed for pain) 90 capsule 0  . gentamicin cream (GARAMYCIN) 0.1 % Apply 1 application topically daily as needed (tube cleaning).    . hydrALAZINE (APRESOLINE) 100 MG tablet Take 1 tablet (100 mg total) by mouth every 8 (eight) hours. 90 tablet 0  . HYDROcodone-acetaminophen (NORCO/VICODIN) 5-325 MG tablet Take 1-2 tablets by mouth every 6 (six) hours as needed for moderate pain. 30 tablet 0  . isosorbide dinitrate (ISORDIL) 5 MG tablet Take 1 tablet (5 mg total) by mouth 2 (two) times daily. 60 tablet 0  . labetalol (NORMODYNE) 300 MG tablet Take 1 tablet (300 mg total) by mouth 2 (two) times daily. 60 tablet 0  . LORazepam (ATIVAN) 1 MG tablet Take 1 tablet (1 mg total) by mouth every 6 (six) hours as needed for anxiety or sleep. (Patient taking differently: Take 1 mg by mouth every 8 (eight) hours as needed for anxiety or sleep. ) 30 tablet  0  . omeprazole (PRILOSEC) 40 MG capsule Take 1 capsule (40 mg total) by mouth daily. 30 capsule 0  . pravastatin (PRAVACHOL) 20 MG tablet Take 1 tablet (20 mg total) by mouth daily. 30 tablet 0  . Vitamin D, Ergocalciferol, (DRISDOL) 50000 units CAPS capsule Take 50,000 Units by mouth every Thursday.     . collagenase (SANTYL) ointment Apply topically daily. (Patient not taking: Reported on 12/03/2016) 15 g 0  . docusate sodium (COLACE) 100 MG capsule Take 1 capsule (100 mg total) by mouth daily. (Patient not taking: Reported on 12/03/2016) 10 capsule 0  . multivitamin (RENA-VIT) TABS tablet Take 1 tablet by mouth at bedtime. (Patient not taking: Reported on 12/03/2016) 30 tablet 0  . saccharomyces boulardii (FLORASTOR) 250 MG capsule Take 1 capsule (250 mg total) by mouth 2 (two) times daily. (Patient not taking: Reported on 12/03/2016) 60 capsule 0    Home: Home Living Family/patient expects to be discharged to:: Unsure Living Arrangements: Spouse/significant other (1 child) Available Help at  Discharge: Family, Available 24 hours/day, Other (Comment) (son available this summer) Type of Home: Mobile home Home Access: Stairs to enter [employer] of Steps: 4 (working on a ramp) Entrance Stairs-Rails: Right, Left, Can reach both Home Layout: One level Bathroom Shower/Tub: Gaffer, Charity fundraiser: Standard Bathroom Accessibility: Yes Home Equipment: Cane - single point, Grab bars - tub/shower, Hand held shower head  Lives With: Spouse, Son   Functional History: Prior Function Level of Independence: Needs assistance Gait / Transfers Assistance Needed: stayed in w/c lately  Functional Status:  Mobility: Bed Mobility Overal bed mobility: Needs Assistance Bed Mobility: Supine to Sit, Sit to Supine Supine to sit: Min assist Sit to supine: Min assist General bed mobility comments: stability assist during movement.  multimodal cues to keep on task, help initiate. Transfers General transfer comment: unable to get pt to attempt transfers.   Difficult to keep on task.      ADL:    Cognition: Cognition Overall Cognitive Status: Impaired/Different from baseline Orientation Level: Oriented to person Cognition Arousal/Alertness: Awake/alert Behavior During Therapy: Flat affect Overall Cognitive Status: Impaired/Different from baseline Area of Impairment: Following commands, Attention, Safety/judgement, Awareness Current Attention Level: Sustained Following Commands: Follows one step commands with increased time, Follows one step commands inconsistently Safety/Judgement: Decreased awareness of safety, Decreased awareness of deficits Awareness: Intellectual Problem Solving: Slow processing, Decreased initiation, Requires verbal cues, Requires tactile cues  Physical Exam: Blood pressure (!) 173/73, pulse 76, temperature 97.4 F (36.3 C), temperature source Oral, resp. rate 16, height _0  (1.778 m), weight 83.4 kg (183 lb 13.8 oz), SpO2 95  %. Physical Exam  Vitals reviewed. Constitutional: He appears well-developed and well-nourished.  HENT:  Head: Normocephalic and atraumatic.  Eyes: EOM are normal. Right eye exhibits no discharge. Left eye exhibits no discharge.  Pupils are reactive to light  Neck: Normal range of motion. Neck supple. No thyromegaly present.  Cardiovascular: Normal rate and regular rhythm.   Respiratory: Effort normal and breath sounds normal. No respiratory distress.  GI: Soft. Bowel sounds are normal. He exhibits no distension.  Musculoskeletal: He exhibits tenderness. He exhibits no edema.  Neurological:  Alert Alert and oriented to person and place. He cannot recall hospital stay from prior admission.  Follows simple commands. Motor: B/l UE, LLE: 5/5 proximal to distal RLE: HF 4/5  Sensation intact to light touch  Generalized twitching  Skin:   Skin. AKA site with wound VAC in place appropriately tender. Multiple  tattoos on body      Results for orders placed or performed during the hospital encounter of 12/04/16 (from the past 48 hour(s))  CBC     Status: Abnormal   Collection Time: 12/09/16  2:37 AM  Result Value Ref Range   WBC 13.1 (H) 4.0 - 10.5 K/uL   RBC 3.46 (L) 4.22 - 5.81 MIL/uL   Hemoglobin 9.1 (L) 13.0 - 17.0 g/dL   HCT 29.7 (L) 39.0 - 52.0 %   MCV 85.8 78.0 - 100.0 fL   MCH 26.3 26.0 - 34.0 pg   MCHC 30.6 30.0 - 36.0 g/dL   RDW 18.7 (H) 11.5 - 15.5 %   Platelets 555 (H) 150 - 400 K/uL  Renal function panel     Status: Abnormal   Collection Time: 12/09/16  2:37 AM  Result Value Ref Range   Sodium 135 135 - 145 mmol/L   Potassium 3.5 3.5 - 5.1 mmol/L   Chloride 99 (L) 101 - 111 mmol/L   CO2 26 22 - 32 mmol/L   Glucose, Bld 106 (H) 65 - 99 mg/dL   BUN 33 (H) 6 - 20 mg/dL   Creatinine, Ser 6.40 (H) 0.61 - 1.24 mg/dL   Calcium 7.0 (L) 8.9 - 10.3 mg/dL   Phosphorus 5.0 (H) 2.5 - 4.6 mg/dL   Albumin 1.6 (L) 3.5 - 5.0 g/dL   GFR calc non Af Amer 10 (L) >60 mL/min   GFR  calc Af Amer 11 (L) >60 mL/min    Comment: (NOTE) The eGFR has been calculated using the CKD EPI equation. This calculation has not been validated in all clinical situations. eGFR's persistently <60 mL/min signify possible Chronic Kidney Disease.    Anion gap 10 5 - 15   No results found.     Medical Problem List and Plan: 1.  Decreased functional mobility secondary to right AKA 12/04/2016 with postop delirium 2.  DVT Prophylaxis/Anticoagulation: SCD left lower extremity 3. Pain Management: Neurontin 100 mg 3 times a day, Hydrocodone and Robaxin as needed. Monitor mental status 4. Mood: Lexapro 10 mg daily, Depakote 500 mg every 12 hours, Ativan 1 mg every 6 hours as needed 5. Neuropsych: This patient is not capable of making decisions on his own behalf. 6. Skin/Wound Care: Routine skin checks 7. Fluids/Electrolytes/Nutrition: Routine I&O with follow-up chemistries 8. Acute blood loss anemia. Follow-up CBC. Continue Aranesp 9. ESRD. Hemodialysis as directed 10. Hypertension. Norvasc 10 mg daily, clonidine patch 0.2 mg weekly, hydralazine 100 mg every 8 hours, Isordil 5 mg twice a day, labetalol 300 mg twice a day. Monitor with increased mobility 11. Diastolic congestive heart failure. Monitor for any signs of fluid overload 12. Hyperlipidemia. Pravachol 20 mg daily 13. Diabetes mellitus with peripheral neuropathy. Latest hemoglobin A1c 5.4. Check blood sugars before meals and at bedtime 14. Tobacco abuse. Counseling  Post Admission Physician Evaluation: 1. Preadmission assessment reviewed and changes made below. 2. Functional deficits secondary  to right AKA.. 3. Patient is admitted to receive collaborative, interdisciplinary care between the physiatrist, rehab nursing staff, and therapy team. 4. Patient's level of medical complexity and substantial therapy needs in context of that medical necessity cannot be provided at a lesser intensity of care such as a SNF. 5. Patient has  experienced substantial functional loss from his/her baseline which was documented above under the "Functional History" and "Functional Status" headings.  Judging by the patient's diagnosis, physical exam, and functional history, the patient has potential for functional progress which will result  in measurable gains while on inpatient rehab.  These gains will be of substantial and practical use upon discharge  in facilitating mobility and self-care at the household level. 33. Physiatrist will provide 24 hour management of medical needs as well as oversight of the therapy plan/treatment and provide guidance as appropriate regarding the interaction of the two. 7. 24 hour rehab nursing will assist with safety, skin/wound care, disease management, pain management and patient education  and help integrate therapy concepts, techniques,education, etc. 8. PT will assess and treat for/with: Lower extremity strength, range of motion, stamina, balance, functional mobility, safety, adaptive techniques and equipment, woundcare, coping skills, pain control, education.   Goals are: Supervision. 9. OT will assess and treat for/with: ADL's, functional mobility, safety, upper extremity strength, adaptive techniques and equipment, wound mgt, ego support, and community reintegration.   Goals are: Supervision. Therapy may not proceed with showering this patient. 10. SLP will assess and treat for/with: cognition.  Goals are: Supervision. 11. [facility] Management and Social Worker will assess and treat for psychological issues and discharge planning. 12. Team conference will be held weekly to assess progress toward goals and to determine barriers to discharge. 13. Patient will receive at least 3 hours of therapy per day at least 5 days per week. 14. ELOS: 7-10 days.       15. Prognosis:  good  Delice Lesch, MD, Mellody Drown Cathlyn Parsons., PA-C 12/09/2016

## 2016-12-09 NOTE — Progress Notes (Signed)
Patient arrived to unit with NT and Mother in law. Oriented to unit and advised of safety plan. Verbalized understanding. No pain at this time.

## 2016-12-09 NOTE — Progress Notes (Signed)
I met with pt's wife at bedside to discuss an inpt rehab admission. Pt asleep. She is in agreement to admit today. I will make the arrangements to admit this afternoon. RN CM and SW made aware. [phone]

## 2016-12-09 NOTE — Progress Notes (Signed)
PT Cancellation Note  Patient Details Name: Hayden [name] MRN: [id number] DOB: 12-17-1970   Cancelled Treatment:    Reason Eval/Treat Not Completed: (P) Other (comment) (pt to d/c to CIR will defer PT need to next level of care.  )   Hayden [name] 12/09/2016, 12:21 PM  Governor Rooks, PTA pager [phone]

## 2016-12-09 NOTE — Care Management Important Message (Signed)
Important Message  Patient Details  Name: Hayden [name] MRN: [id number] Date of Birth: 1970/09/24   Medicare Important Message Given:  Yes    Orbie Pyo 12/09/2016, 12:11 PM

## 2016-12-09 NOTE — PMR Pre-admission (Signed)
PMR Admission Coordinator Pre-Admission Assessment  Patient: Hayden [name] is an 46 y.o., male MRN: [id number] DOB: [birth year] Height: 5\' 10"  (177.8 cm) Weight: 83.4 kg (183 lb 13.8 oz)              Insurance Information HMO:     PPO:      PCP:      IPA:      80/20: yes     OTHER: no HMO PRIMARY: medicare a and b      Policy#: [id number]      Subscriber: pt Benefits:  Phone #: passport one online     Name: 12/09/2016 Eff. Date: a 09/29/93 b 06/01/2015     Deduct: $1340      Out of Pocket Max: none      Life Max: none CIR: 100%      SNF: 20 full days Outpatient: 80%     Co-Pay: 20% Home Health: 100%      Co-Pay: none DME: 80%     Co-Pay: 20% Providers: pt choice  SECONDARY: [employer]       Policy#: [id number]      Subscriber: pt  Medicaid Application Date:       Case Manager:  Disability Application Date:       Case Worker:   Emergency Contact Information Contact Information    Name Relation Home Work Mobile   [place] Spouse  [phone]   Hayden [name]   [phone]     Current Medical History  Patient Admitting Diagnosis:  Right AKA  History of Present Illness: HPI: Hayden [name] a 46 y.o.malewith history of diastolic congestive heart failure, tobacco abuse, ESRD, T2DM, PVD s/p revascularization (recent CIR admission 11/20/2016-11/28/2016 for debilitation after right first digit amputation lower extremity).  Admitted 12/04/16 with necrotic changes right tibial muscle and flexion contracture at knee as well as recent fall on incision line and underwent R-AKA by Dr. Sharol Given 12/04/2016. Wound VAC as directed.  Post op has had issues with confusion and lethargy due to encephalopathy. Mentation improved post HD and was able to participate in therapy, however, appears to wax/wane.  Hospital course complicated by post-op pain, leukocytosis, ABLA. CIR recommended due to decline in functional mobility.   Past Medical History  Past Medical History:   Diagnosis Date  . Anemia March 2014  . Cancer ([place])    Kidney  . CHF (congestive heart failure) ([place]) 6381   Acute systolic and diastolic CHF  . Depression    & rage --  was in counseling....great now  . Diabetes mellitus    NO DM SINCE LOST 130LBS  . ESRD on peritoneal dialysis ([place]) 2018   Started in-center HD approx 2015 for 2 years, then did about 1 year of home HD and then started peritoneal dialysis in early 2018.    . Gangrene ([place])    right leg and foot  . History of kidney cancer   . Hyperlipidemia   . Hypertension   . Obesity   . PONV (postoperative nausea and vomiting)   . Stroke [facility])     Family History  family history includes Deep vein thrombosis in his father; Heart attack in his father; Heart disease in his mother; Other in his other.  Prior Rehab/Hospitalizations:  Has the patient had major surgery during 100 days prior to admission? Yes  Recent CIR admit for 8 days 10/2016  Current Medications   Current Facility-Administered Medications:  .  0.9 %  sodium chloride infusion, ,  Intravenous, Continuous, Newt Minion, MD, Last Rate: 10 mL/hr at 12/04/16 1718 .  0.9 %  sodium chloride infusion, , Intravenous, Once, Ejigiri, Ogechi Grace, PA-C .  0.9 %  sodium chloride infusion, 100 mL, Intravenous, PRN, Ejigiri, Thomos Lemons, PA-C .  0.9 %  sodium chloride infusion, 100 mL, Intravenous, PRN, Ejigiri, Thomos Lemons, PA-C .  acetaminophen (TYLENOL) tablet 650 mg, 650 mg, Oral, Q6H PRN **OR** acetaminophen (TYLENOL) suppository 650 mg, 650 mg, Rectal, Q6H PRN, Newt Minion, MD .  amLODipine (NORVASC) tablet 10 mg, 10 mg, Oral, Daily, Newt Minion, MD, 10 mg at 12/09/16 0831 .  aspirin EC tablet 325 mg, 325 mg, Oral, Daily, Newt Minion, MD, 325 mg at 12/09/16 5009 .  bisacodyl (DULCOLAX) suppository 10 mg, 10 mg, Rectal, Daily PRN, Newt Minion, MD .  calcitRIOL (ROCALTROL) capsule 0.5 mcg, 0.5 mcg, Oral, Daily, Newt Minion, MD, 0.5 mcg at 12/09/16  0831 .  cinacalcet (SENSIPAR) tablet 60 mg, 60 mg, Oral, QPC supper, Newt Minion, MD, 60 mg at 12/08/16 1703 .  cloNIDine (CATAPRES - Dosed in mg/24 hr) patch 0.2 mg, 0.2 mg, Transdermal, Q Thu, Newt Minion, MD .  Darbepoetin Alfa (ARANESP) injection 200 mcg, 200 mcg, Subcutaneous, Q Thu-1800, Ernest Haber, PA-C, 200 mcg at 12/05/16 2012 .  divalproex (DEPAKOTE SPRINKLE) capsule 500 mg, 500 mg, Oral, Q12H, Newt Minion, MD, 500 mg at 12/09/16 0831 .  docusate sodium (COLACE) capsule 100 mg, 100 mg, Oral, BID, Newt Minion, MD, 100 mg at 12/07/16 2108 .  escitalopram (LEXAPRO) tablet 10 mg, 10 mg, Oral, Daily, Newt Minion, MD, 10 mg at 12/09/16 0831 .  feeding supplement (NEPRO CARB STEADY) liquid 237 mL, 237 mL, Oral, BID BM, Newt Minion, MD .  ferric citrate (AURYXIA) tablet 420 mg, 420 mg, Oral, TID WC, Newt Minion, MD, 420 mg at 12/09/16 3818 .  gabapentin (NEURONTIN) capsule 100 mg, 100 mg, Oral, TID, Newt Minion, MD, 100 mg at 12/09/16 0830 .  gentamicin cream (GARAMYCIN) 0.1 % 1 application, 1 application, Topical, Daily PRN, Newt Minion, MD, 1 application at 29/93/71 2250 .  gentamicin cream (GARAMYCIN) 0.1 % 1 application, 1 application, Topical, Daily, [place], Ryan B, MD, 1 application at 69/67/89 1041 .  heparin injection 1,000 Units, 1,000 Units, Dialysis, PRN, Lynnda Child, PA-C .  hydrALAZINE (APRESOLINE) tablet 100 mg, 100 mg, Oral, Q8H, Newt Minion, MD, 100 mg at 12/09/16 0555 .  HYDROcodone-acetaminophen (NORCO) 10-325 MG per tablet 1-2 tablet, 1-2 tablet, Oral, Q4H PRN, Newt Minion, MD, 2 tablet at 12/08/16 1822 .  isosorbide dinitrate (ISORDIL) tablet 5 mg, 5 mg, Oral, BID, Newt Minion, MD, 5 mg at 12/09/16 0829 .  labetalol (NORMODYNE) tablet 300 mg, 300 mg, Oral, BID, Newt Minion, MD, 300 mg at 12/09/16 0829 .  lidocaine (PF) (XYLOCAINE) 1 % injection 5 mL, 5 mL, Intradermal, PRN, Ejigiri, Thomos Lemons, PA-C .   lidocaine-prilocaine (EMLA) cream 1 application, 1 application, Topical, PRN, Ejigiri, Thomos Lemons, PA-C .  LORazepam (ATIVAN) tablet 1 mg, 1 mg, Oral, Q6H PRN, Newt Minion, MD, 1 mg at 12/08/16 2128 .  methocarbamol (ROBAXIN) tablet 500 mg, 500 mg, Oral, Q6H PRN, 500 mg at 12/08/16 2127 **OR** methocarbamol (ROBAXIN) 500 mg in dextrose 5 % 50 mL IVPB, 500 mg, Intravenous, Q6H PRN, Newt Minion, MD .  metoCLOPramide (REGLAN) tablet 5-10 mg, 5-10 mg, Oral, Q8H PRN **OR**  metoCLOPramide (REGLAN) injection 5-10 mg, 5-10 mg, Intravenous, Q8H PRN, Newt Minion, MD .  ondansetron (ZOFRAN) tablet 4 mg, 4 mg, Oral, Q6H PRN **OR** ondansetron (ZOFRAN) injection 4 mg, 4 mg, Intravenous, Q6H PRN, Newt Minion, MD .  pantoprazole (PROTONIX) EC tablet 40 mg, 40 mg, Oral, Daily, Newt Minion, MD, 40 mg at 12/09/16 0829 .  pentafluoroprop-tetrafluoroeth (GEBAUERS) aerosol 1 application, 1 application, Topical, PRN, Ejigiri, Thomos Lemons, PA-C .  polyethylene glycol (MIRALAX / GLYCOLAX) packet 17 g, 17 g, Oral, Daily PRN, Newt Minion, MD .  pravastatin (PRAVACHOL) tablet 20 mg, 20 mg, Oral, q1800, Newt Minion, MD, 20 mg at 12/08/16 1703  Patients Current Diet: Diet renal with fluid restriction Fluid restriction: 1200 mL Fluid; Room service appropriate? Yes; Fluid consistency: Thin Diet - low sodium heart healthy  Precautions / Restrictions Precautions Precautions: Fall Restrictions Weight Bearing Restrictions: Yes RLE Weight Bearing: Non weight bearing   Has the patient had 2 or more falls or a fall with injury in the past year?No  Prior Activity Level Limited Community (1-2x/wk): limited for recent d/c from CIR home with [facility]. Was on CCPD. Pt has been on hemodialysis at home as well as CCPD at home in the past several years  Development worker, international aid / [place] Devices/Equipment: Wheelchair Home Equipment: Cane - single point, Grab bars - tub/shower, Hand held shower  head  Prior Device Use: Indicate devices/aids used by the patient prior to current illness, exacerbation or injury? Manual wheelchair  Prior Functional Level Prior Function Level of Independence: Needs assistance Gait / Transfers Assistance Needed: stayed in w/c lately Comments: wife assisted with home CCPD  Self Care: Did the patient need help bathing, dressing, using the toilet or eating?  Needed some help  Indoor Mobility: Did the patient need assistance with walking from room to room (with or without device)? Needed some help  Stairs: Did the patient need assistance with internal or external stairs (with or without device)? Needed some help  Functional Cognition: Did the patient need help planning regular tasks such as shopping or remembering to take medications? Needed some help  Current Functional Level Cognition  Overall Cognitive Status: Impaired/Different from baseline Current Attention Level: Sustained Orientation Level: Oriented to person Following Commands: Follows one step commands with increased time, Follows one step commands inconsistently Safety/Judgement: Decreased awareness of safety, Decreased awareness of deficits    Extremity Assessment (includes Sensation/Coordination)  Upper Extremity Assessment: Generalized weakness  Lower Extremity Assessment: RLE deficits/detail, LLE deficits/detail RLE Deficits / Details: moves against gravity, but not tested due to pain RLE: Unable to fully assess due to pain RLE Coordination: decreased fine motor LLE Deficits / Details: generally functional, but not tested.    ADLs       Mobility  Overal bed mobility: Needs Assistance Bed Mobility: Supine to Sit, Sit to Supine Supine to sit: Min assist Sit to supine: Min assist General bed mobility comments: stability assist during movement.  multimodal cues to keep on task, help initiate.    Transfers  General transfer comment: unable to get pt to attempt transfers.    Difficult to keep on task.    Ambulation / Gait / Stairs / Office manager / Balance Dynamic Sitting Balance Sitting balance - Comments: UE assist not required Balance Overall balance assessment: Needs assistance Sitting balance-Leahy Scale: Good Sitting balance - Comments: UE assist not required    Special needs/care consideration BiPAP/CPAP  N/a CPM  N/a Continuous Drip IV  N/a Dialysis yes hemodialysis     DaysTues, Thurs, Sat. Recent switch from CCPD this admission Life Vest  N/a Oxygen  N/a Special Bed  N/a Trach Size  N/a Wound Vac yes     Location right AKA site Skin  Peritoneal dialysis catheter site with gauze dressing; Moisture associated  Skin change to buttocks; ecchymosis right abdomen and hip Bowel mgmt: LBM 6/10 incontinent Bladder mgmt: continent Diabetic mgmt n/a   Previous Home Environment Living Arrangements: Spouse/significant other, Children (59 year old son, Grayland Ormond)  Lives With: Spouse, Son Available Help at Discharge: Family, Available 24 hours/day (son recent [employer] graduate with no plans for the summet) Type of Home: Mobile home Home Layout: One level Home Access: Stairs to enter Entrance Stairs-Rails: Right, Left, Can reach both Entrance Stairs-Number of Steps: 4 Bathroom Shower/Tub: Gaffer, Door [employer] Toilet: Standard Bathroom Accessibility: Yes How Accessible: Accessible via wheelchair, Accessible via walker Home Care Services: Yes Type of Home Care Services: Home OT, [place] (if known): [facility]  Discharge Living Setting Plans for Discharge Living Setting: Patient's home, Lives with (comment) (spouse and 61 year old son) Type of Home at Discharge: Mobile home Discharge Home Layout: One level Discharge Home Access: Stairs to enter Entrance Stairs-Rails: Right, Left Entrance Stairs-Number of Steps: 3-4 Discharge Bathroom Shower/Tub: Tub only, Walk-in shower Discharge Bathroom  Toilet: Standard Discharge Bathroom Accessibility: Yes How Accessible: Accessible via wheelchair, Accessible via walker Does the patient have any problems obtaining your medications?: No  Social/Family/Support Systems Patient Roles: Spouse, Parent Contact Information: Joelene Millin, wife Anticipated Caregiver: [name] and children Anticipated Ambulance person Information: see above Ability/Limitations of Caregiver: wife works 81st and 2nd shifts; 66 year old son out of school for the summer Caregiver Availability: 24/7 Discharge Plan Discussed with Primary Caregiver: Yes Is Caregiver In Agreement with Plan?: Yes Does Caregiver/Family have Issues with Lodging/Transportation while Pt is in Rehab?: No  Goals/Additional Needs Patient/Family Goal for Rehab: supervision to min assist with PT and OT, supervision to Mod I with SLP Expected length of stay: ELOS 16-19 days Dietary Needs: renal diet, fluid restrictions, thin liquids Special Service Needs: Pt was on CCPD pta, but has been switched to hemodialysis Tues, Thurs, and Saturday since admission. CLIP process for outpt dialysis underway Pt/Family Agrees to Admission and willing to participate: Yes Program Orientation Provided & Reviewed with Pt/Caregiver Including Roles  & Responsibilities: Yes  Decrease burden of Care through IP rehab admission: n/a  Possible need for SNF placement upon discharge: not anticipated  Patient Condition: This patient's condition remains as documented in the consult dated 12/07/2016, in which the Rehabilitation Physician determined and documented that the patient's condition is appropriate for intensive rehabilitative care in an inpatient rehabilitation facility. Will admit to inpatient rehab today.  Preadmission Screen Completed By:  Cleatrice Burke, 12/09/2016 11:39 AM ______________________________________________________________________   Discussed status with Dr. Posey Pronto on 12/09/2016 at  1139 and received  telephone approval for admission today.  Admission Coordinator:  Cleatrice Burke, time [id number] Date 6/11/ 2018

## 2016-12-09 NOTE — Progress Notes (Signed)
[name], MD Physician Addendum Physical Medicine and Rehabilitation  Consult Note Date of Service: 12/06/2016 7:03 PM  Related encounter: Admission (Discharged) from 12/04/2016 in [place]   [] Hide copied text [] Hover for attribution information      Physical Medicine and Rehabilitation Consult   Reason for Consult: R-AKA Referring Physician: Dr. Sharol Given   HPI: Hayden [name] is a 46 y.o. male with history of ESRD, T2DM, PVD s/p revascularization (recent CIR admission) who was admitted 12/04/16 with necrotic changes right tibial muscle and flexion contracture at knee and underwent R-AKA by Dr. Sharol Given. History taken from chart review.  Post op has had issues with confusion and lethargy due to encephalopathy. Mentation improved past HD and was able to participate in therapy, however, appears to wax/wane. Hospital course complicated by post-op pain, leukocytosis, ABLA. CIR recommended due to decline in functional mobility.   Review of Systems  Unable to perform ROS: Mental acuity          Past Medical History:  Diagnosis Date  . Anemia March 2014  . Cancer ([place])    Kidney  . CHF (congestive heart failure) ([place]) 3846   Acute systolic and diastolic CHF  . Depression    & rage --  was in counseling....great now  . Diabetes mellitus    NO DM SINCE LOST 130LBS  . ESRD on peritoneal dialysis ([place]) 2018   Started in-center HD approx 2015 for 2 years, then did about 1 year of home HD and then started peritoneal dialysis in early 2018.    . Gangrene ([place])    right leg and foot  . History of kidney cancer   . Hyperlipidemia   . Hypertension   . Obesity   . PONV (postoperative nausea and vomiting)   . Stroke [facility])          Past Surgical History:  Procedure Laterality Date  . ABDOMINAL AORTOGRAM W/LOWER EXTREMITY N/A 10/28/2016   Procedure: Abdominal Aortogram w/Lower Extremity;   Surgeon: Angelia Mould, MD;  Location: [place] CV [facility];  Service: Cardiovascular;  Laterality: N/A;  . AMPUTATION Right 11/05/2016   Procedure: AMPUTATION RIGHT FIRST RAY;  Surgeon: Conrad Pontoosuc, MD;  Location: [place];  Service: Vascular;  Laterality: Right;  . AMPUTATION Right 12/04/2016   Procedure: RIGHT ABOVE KNEE AMPUTATION;  Surgeon: Newt Minion, MD;  Location: [place];  Service: Orthopedics;  Laterality: Right;  . AV FISTULA PLACEMENT Left 01/26/2013   Procedure: ARTERIOVENOUS (AV) FISTULA CREATION - LEFT RADIAL CEPHALIC AVF;  Surgeon: Angelia Mould, MD;  Location: [place];  Service: Vascular;  Laterality: Left;  . CHOLECYSTECTOMY  11/06/2015   Procedure: LAPAROSCOPIC CHOLECYSTECTOMY;  Surgeon: Ralene Ok, MD;  Location: [place];  Service: General;;  . FEMORAL-POPLITEAL BYPASS GRAFT Right 11/05/2016   Procedure: BYPASS GRAFT FEMORAL-POPLITEAL ARTERY USING NON-REVERSED RIGHT GREATER SAPPHENOUS VEIN;  Surgeon: Conrad Skyline-Ganipa, MD;  Location: [place];  Service: Vascular;  Laterality: Right;  . HERNIA REPAIR    . LOWER EXTREMITY ANGIOGRAM Right 10/30/2016   Procedure: Right  LOWER EXTREMITY ANGIOGRAM WITH RIGHT SUPERFICIAL FEMORAL ARTERY balloon angioplasty;  Surgeon: Conrad Belmar, MD;  Location: [place];  Service: Vascular;  Laterality: Right;  . NEPHRECTOMY Right 2008   partial  . TESTICLE TORSION REDUCTION    . TONSILLECTOMY AND ADENOIDECTOMY    . VEIN HARVEST Right 11/05/2016   Procedure: RIGHT GREATER SAPPHENOUS VEIN  HARVEST;  Surgeon: Conrad , MD;  Location[facility];  Service: Vascular;  Laterality: Right;         Family History  Problem Relation Age of Onset  . Heart disease Mother        Heart Disease before age 29  . Deep vein thrombosis Father   . Heart attack Father   . Other Other     Social History:  reports that he has been smoking Cigarettes.  He has a 2.50 pack-year smoking history. He has never used smokeless tobacco. He reports that  he uses drugs, including Marijuana. He reports that he does not drink alcohol.          Allergies  Allergen Reactions  . Dilaudid [Hydromorphone Hcl] Other (See Comments)    ABNORMAL BEHAVIORS "VERBALLY AND PHYSICALLY ABUSIVE" PSYCHOSIS  . Morphine And Related Itching and Other (See Comments)          Medications Prior to Admission  Medication Sig Dispense Refill  . amLODipine (NORVASC) 10 MG tablet Take 1 tablet (10 mg total) by mouth daily. 30 tablet 0  . aspirin EC 81 MG tablet Take 81 mg by mouth daily.    . calcitRIOL (ROCALTROL) 0.5 MCG capsule Take 1 capsule (0.5 mcg total) by mouth daily. 30 capsule 0  . cinacalcet (SENSIPAR) 60 MG tablet Take 1 tablet (60 mg total) by mouth daily. 30 tablet 0  . cloNIDine (CATAPRES - DOSED IN MG/24 HR) 0.2 mg/24hr patch Place 1 patch (0.2 mg total) onto the skin once a week. (Patient taking differently: Place 0.2 mg onto the skin every Thursday. ) 4 patch 12  . divalproex (DEPAKOTE SPRINKLE) 125 MG capsule Take 4 capsules (500 mg total) by mouth every 12 (twelve) hours. 60 capsule 0  . escitalopram (LEXAPRO) 10 MG tablet Take 1 tablet (10 mg total) by mouth daily. 30 tablet 0  . ferric citrate (AURYXIA) 1 GM 210 MG(Fe) tablet Take 2 tablets (420 mg total) by mouth 3 (three) times daily with meals. 270 tablet 0  . gabapentin (NEURONTIN) 100 MG capsule Take 1 capsule (100 mg total) by mouth 3 (three) times daily. (Patient taking differently: Take 100 mg by mouth 2 (two) times daily. May take an additional 100mg  daily as needed for pain) 90 capsule 0  . gentamicin cream (GARAMYCIN) 0.1 % Apply 1 application topically daily as needed (tube cleaning).    . hydrALAZINE (APRESOLINE) 100 MG tablet Take 1 tablet (100 mg total) by mouth every 8 (eight) hours. 90 tablet 0  . HYDROcodone-acetaminophen (NORCO/VICODIN) 5-325 MG tablet Take 1-2 tablets by mouth every 6 (six) hours as needed for moderate pain. 30 tablet 0  . isosorbide dinitrate  (ISORDIL) 5 MG tablet Take 1 tablet (5 mg total) by mouth 2 (two) times daily. 60 tablet 0  . labetalol (NORMODYNE) 300 MG tablet Take 1 tablet (300 mg total) by mouth 2 (two) times daily. 60 tablet 0  . LORazepam (ATIVAN) 1 MG tablet Take 1 tablet (1 mg total) by mouth every 6 (six) hours as needed for anxiety or sleep. (Patient taking differently: Take 1 mg by mouth every 8 (eight) hours as needed for anxiety or sleep. ) 30 tablet 0  . omeprazole (PRILOSEC) 40 MG capsule Take 1 capsule (40 mg total) by mouth daily. 30 capsule 0  . pravastatin (PRAVACHOL) 20 MG tablet Take 1 tablet (20 mg total) by mouth daily. 30 tablet 0  . Vitamin D, Ergocalciferol, (DRISDOL) 50000 units CAPS capsule  Take 50,000 Units by mouth every Thursday.     . collagenase (SANTYL) ointment Apply topically daily. (Patient not taking: Reported on 12/03/2016) 15 g 0  . docusate sodium (COLACE) 100 MG capsule Take 1 capsule (100 mg total) by mouth daily. (Patient not taking: Reported on 12/03/2016) 10 capsule 0  . multivitamin (RENA-VIT) TABS tablet Take 1 tablet by mouth at bedtime. (Patient not taking: Reported on 12/03/2016) 30 tablet 0  . saccharomyces boulardii (FLORASTOR) 250 MG capsule Take 1 capsule (250 mg total) by mouth 2 (two) times daily. (Patient not taking: Reported on 12/03/2016) 60 capsule 0    Home: Home Living Family/patient expects to be discharged to:: Unsure Living Arrangements: Spouse/significant other (1 child) Available Help at Discharge: Family, Available 24 hours/day, Other (Comment) (son available this summer) Type of Home: Mobile home Home Access: Stairs to enter [employer] of Steps: 4 (working on a ramp) Entrance Stairs-Rails: Right, Left, Can reach both Home Layout: One level Bathroom Shower/Tub: Gaffer, Charity fundraiser: Standard Bathroom Accessibility: Yes Home Equipment: Cane - single point, Grab bars - tub/shower, Hand held shower head  Lives With: Spouse, Son    Functional History: Prior Function Level of Independence: Needs assistance Gait / Transfers Assistance Needed: stayed in w/c lately Functional Status:  Mobility: Bed Mobility Overal bed mobility: Needs Assistance Bed Mobility: Supine to Sit, Sit to Supine Supine to sit: Min assist Sit to supine: Min assist General bed mobility comments: stability assist during movement.  multimodal cues to keep on task, help initiate. Transfers General transfer comment: unable to get pt to attempt transfers.   Difficult to keep on task.  ADL:  Cognition: Cognition Overall Cognitive Status: Impaired/Different from baseline Orientation Level: Oriented to person, Disoriented to place, Disoriented to time, Disoriented to situation Cognition Arousal/Alertness: Awake/alert Behavior During Therapy: Flat affect Overall Cognitive Status: Impaired/Different from baseline Area of Impairment: Following commands, Attention, Safety/judgement, Awareness Current Attention Level: Sustained Following Commands: Follows one step commands with increased time, Follows one step commands inconsistently Safety/Judgement: Decreased awareness of safety, Decreased awareness of deficits Awareness: Intellectual Problem Solving: Slow processing, Decreased initiation, Requires verbal cues, Requires tactile cues   Blood pressure (!) 172/70, pulse 70, temperature 98.4 F (36.9 C), resp. rate 16, weight 84.2 kg (185 lb 10 oz), SpO2 95 %. Physical Exam  Nursing note and vitals reviewed. Constitutional: He appears well-developed and well-nourished.  HENT:  Head: Normocephalic and atraumatic.  Eyes: Conjunctivae and EOM are normal. Pupils are equal, round, and reactive to light.  Neck: Normal range of motion. Neck supple.  Cardiovascular: Normal rate and regular rhythm.   Respiratory: Effort normal and breath sounds normal. No respiratory distress. He has no wheezes.  GI: Soft. Bowel sounds are normal. He exhibits no  distension. There is no tenderness.  Musculoskeletal: He exhibits tenderness. He exhibits no edema.  Neurological: He is alert.  Smiles but looks blankly.  Intermittent tremors noted.  Not following commands  Skin:  +VAC right AKA    Lab Results Last 24 Hours       Results for orders placed or performed during the hospital encounter of 12/04/16 (from the past 24 hour(s))  Iron and TIBC     Status: Abnormal   Collection Time: 12/05/16  8:39 PM  Result Value Ref Range   Iron 18 (L) 45 - 182 ug/dL   TIBC 185 (L) 250 - 450 ug/dL   Saturation Ratios 10 (L) 17.9 - 39.5 %   UIBC 167 ug/dL  Phosphorus     Status: Abnormal   Collection Time: 12/05/16  8:39 PM  Result Value Ref Range   Phosphorus 6.1 (H) 2.5 - 4.6 mg/dL  Glucose, capillary     Status: None   Collection Time: 12/05/16  9:29 PM  Result Value Ref Range   Glucose-Capillary 81 65 - 99 mg/dL  Glucose, capillary     Status: None   Collection Time: 12/06/16  6:44 AM  Result Value Ref Range   Glucose-Capillary 95 65 - 99 mg/dL  Renal function panel     Status: Abnormal   Collection Time: 12/06/16  8:01 AM  Result Value Ref Range   Sodium 134 (L) 135 - 145 mmol/L   Potassium 4.1 3.5 - 5.1 mmol/L   Chloride 96 (L) 101 - 111 mmol/L   CO2 25 22 - 32 mmol/L   Glucose, Bld 88 65 - 99 mg/dL   BUN 61 (H) 6 - 20 mg/dL   Creatinine, Ser 7.46 (H) 0.61 - 1.24 mg/dL   Calcium 6.9 (L) 8.9 - 10.3 mg/dL   Phosphorus 7.6 (H) 2.5 - 4.6 mg/dL   Albumin 1.6 (L) 3.5 - 5.0 g/dL   GFR calc non Af Amer 8 (L) >60 mL/min   GFR calc Af Amer 9 (L) >60 mL/min   Anion gap 13 5 - 15  Type and screen [place]     Status: None (Preliminary result)   Collection Time: 12/06/16 11:02 AM  Result Value Ref Range   ABO/RH(D) A NEG    Antibody Screen NEG    Sample Expiration 12/09/2016    Unit Number [id number]    Blood Component Type RBC, LR IRR    Unit division 00    Status of Unit  ISSUED    Transfusion Status OK TO TRANSFUSE    Crossmatch Result Compatible    Unit Number [id number]    Blood Component Type RED CELLS,LR    Unit division 00    Status of Unit ISSUED    Transfusion Status OK TO TRANSFUSE    Crossmatch Result Compatible   Prepare RBC     Status: None   Collection Time: 12/06/16 11:02 AM  Result Value Ref Range   Order Confirmation ORDER PROCESSED BY BLOOD BANK   CBC     Status: Abnormal   Collection Time: 12/06/16 11:18 AM  Result Value Ref Range   WBC 10.3 4.0 - 10.5 K/uL   RBC 2.55 (L) 4.22 - 5.81 MIL/uL   Hemoglobin 6.6 (LL) 13.0 - 17.0 g/dL   HCT 21.3 (L) 39.0 - 52.0 %   MCV 83.5 78.0 - 100.0 fL   MCH 25.9 (L) 26.0 - 34.0 pg   MCHC 31.0 30.0 - 36.0 g/dL   RDW 19.8 (H) 11.5 - 15.5 %   Platelets 498 (H) 150 - 400 K/uL  Prepare RBC     Status: None   Collection Time: 12/06/16 12:19 PM  Result Value Ref Range   Order Confirmation ORDER PROCESSED BY BLOOD BANK   Glucose, capillary     Status: None   Collection Time: 12/06/16  4:25 PM  Result Value Ref Range   Glucose-Capillary 76 65 - 99 mg/dL  Hemoglobin and hematocrit, blood     Status: Abnormal   Collection Time: 12/06/16  4:32 PM  Result Value Ref Range   Hemoglobin 8.7 (L) 13.0 - 17.0 g/dL   HCT 27.1 (L) 39.0 - 52.0 %     Imaging Results (Last 48  hours)  No results found.    Assessment/Plan: Diagnosis: Right AKA with encephalopathy Labs independently reviewed.  Records reviewed and summated above. Clean amputation daily with soap and water Monitor incision site for signs of infection or impending skin breakdown. Staples to remain in place for 3-4 weeks Stump shrinker, for edema control  Scar mobilization massaging to prevent soft tissue adherence Stump protector during therapies Prevent flexion contractures by implementing the following:              Encourage prone lying for 20-30 mins per day BID to avoid hip flexion        Contractures if medically appropriate;             Avoid prolonged sitting Post surgical pain control with oral medication Phantom limb pain control with physical modalities including desensitization techniques (gentle self massage to the residual stump,hot packs if sensation intact, [place]) and mirror therapy, TENS. If ineffective, consider pharmacological treatment for neuropathic pain (e.g gabapentin, pregabalin, amytriptalyine, duloxetine).  Avoid injury to contralateral side  1. Does the need for close, 24 hr/day medical supervision in concert with the patient's rehab needs make it unreasonable for this patient to be served in a less intensive setting? Yes 2. Co-Morbidities requiring supervision/potential complications: ESRD (HD per nephro), T2DM (Monitor in accordance with exercise and adjust meds as necessary), PVD s/p revascularization (recent CIR admission), tobacco abuse (counsel when appropriate), ABLA on chronic anemia (transfuse if necessary to ensure appropriate perfusion for increased activity tolerance), post-op pain (Biofeedback training with therapies to help reduce reliance on opiate pain medications, monitor pain control during therapies, and sedation at rest and titrate to maximum efficacy to ensure participation and gains in therapies) 3. Due to bowel management, safety, skin/wound care, disease management, medication administration, pain management and patient education, does the patient require 24 hr/day rehab nursing? Yes 4. Does the patient require coordinated care of a physician, rehab nurse, PT (1-2 hrs/day, 5 days/week), OT (1-2 hrs/day, 5 days/week) and SLP (1-2 hrs/day, 5 days/week) to address physical and functional deficits in the context of the above medical diagnosis(es)? Yes Addressing deficits in the following areas: balance, endurance, locomotion, strength, transferring, bathing, dressing, toileting, cognition and psychosocial support 5. Can the patient actively  participate in an intensive therapy program of at least 3 hrs of therapy per day at least 5 days per week? Potentially 6. The potential for patient to make measurable gains while on inpatient rehab is excellent 7. Anticipated functional outcomes upon discharge from inpatient rehab are Likely supervision/Min A  with PT, Likely supervision/Min A with OT, independent with SLP. 8. Estimated rehab length of stay to reach the above functional goals is: 16-19 days. 9. Anticipated D/C setting: Home 10. Anticipated post D/C treatments: HH therapy and Home excercise program 11. Overall Rehab/Functional Prognosis: good  RECOMMENDATIONS: This patient's condition is appropriate for continued rehabilitative care in the following setting: CIR when medically stable and able to participate in 3 hours therapy/day.. Patient has agreed to participate in recommended program. Potentially Note that insurance prior authorization may be required for reimbursement for recommended care.  Comment: Rehab Admissions Coordinator to follow up.  Delice Lesch, MD, Mellody Drown 12/07/16 Bary Leriche, PA-C 12/06/2016    Revision History                             Routing History

## 2016-12-09 NOTE — Progress Notes (Signed)
[facility] Progress Note   Subjective: "I am just dandy!" Recognizes me from working with Dr. Moshe Cipro. No C/Os. Oriented to person and place, knows it's 2018. Sit up for exam. Wife and mother-in-law at bedside.  Objective Vitals:   12/08/16 1055 12/08/16 1700 12/08/16 2011 12/09/16 0500  BP: (!) 160/71  (!) 155/66 (!) 173/73  Pulse: 88  78 76  Resp: 17  17 16   Temp: 98.8 F (37.1 C)  98.4 F (36.9 C) 97.4 F (36.3 C)  TempSrc: Oral  Oral Oral  SpO2: 97%  95% 95%  Weight:  83.4 kg (183 lb 13.8 oz)    Height:       Physical Exam General: Awake, pleasant and cooperative. NAD Heart: W1,U2, 2/6 systolic M Lungs: CTAB A/P Abdomen: soft, active BS Extremities: No LE edema. R AKA with wound vac in place. Trace stump edema.  Dialysis Access: PD cath LUQ drsg current CDI. LUA AVF + bruit    Additional Objective Labs: Basic Metabolic Panel:  Recent Labs Lab 12/05/16 1121 12/05/16 2039 12/06/16 0801 12/09/16 0237  NA 135  --  134* 135  K 4.2  --  4.1 3.5  CL 96*  --  96* 99*  CO2 21*  --  25 26  GLUCOSE 92  --  88 106*  BUN 109*  --  61* 33*  CREATININE 10.59*  --  7.46* 6.40*  CALCIUM 6.8*  --  6.9* 7.0*  PHOS 11.5* 6.1* 7.6* 5.0*   Liver Function Tests:  Recent Labs Lab 12/04/16 0743 12/05/16 1121 12/06/16 0801 12/09/16 0237  AST 21  --   --   --   ALT 23  --   --   --   ALKPHOS 234*  --   --   --   BILITOT 0.7  --   --   --   PROT 5.6*  --   --   --   ALBUMIN 1.9* 1.5* 1.6* 1.6*   No results for input(s): LIPASE, AMYLASE in the last 168 hours. CBC:  Recent Labs Lab 12/04/16 0743 12/05/16 1121 12/06/16 1118 12/06/16 1632 12/09/16 0237  WBC 12.2* 11.2* 10.3  --  13.1*  HGB 8.1* 7.1* 6.6* 8.7* 9.1*  HCT 26.1* 22.7* 21.3* 27.1* 29.7*  MCV 82.3 82.5 83.5  --  85.8  PLT 640* 537* 498*  --  555*   Blood Culture    Component Value Date/Time   SDES FLUID PERITONEAL DIALYSIS 11/09/2016 0836   SDES FLUID PERITONEAL DIALYSIS  11/09/2016 0836   SPECREQUEST NONE 11/09/2016 0836   SPECREQUEST NONE 11/09/2016 0836   CULT NO GROWTH 5 DAYS 11/09/2016 0836   REPTSTATUS 11/14/2016 FINAL 11/09/2016 0836   REPTSTATUS 11/09/2016 FINAL 11/09/2016 0836    Cardiac Enzymes: No results for input(s): CKTOTAL, CKMB, CKMBINDEX, TROPONINI in the last 168 hours. CBG:  Recent Labs Lab 12/05/16 1140 12/05/16 2129 12/06/16 0644 12/06/16 1625 12/06/16 2010  GLUCAP 93 81 95 76 73   Iron Studies: No results for input(s): IRON, TIBC, TRANSFERRIN, FERRITIN in the last 72 hours. @lablastinr3 @ Studies/Results: No results found. Medications: . sodium chloride 10 mL/hr at 12/04/16 1718  . sodium chloride    . sodium chloride    . sodium chloride    . methocarbamol (ROBAXIN)  IV     . amLODipine  10 mg Oral Daily  . aspirin EC  325 mg Oral Daily  . calcitRIOL  0.5 mcg Oral Daily  . cinacalcet  60 mg Oral  QPC supper  . cloNIDine  0.2 mg Transdermal Q Thu  . darbepoetin (ARANESP) injection - NON-DIALYSIS  200 mcg Subcutaneous Q Thu-1800  . divalproex  500 mg Oral Q12H  . docusate sodium  100 mg Oral BID  . escitalopram  10 mg Oral Daily  . feeding supplement (NEPRO CARB STEADY)  237 mL Oral BID BM  . ferric citrate  420 mg Oral TID WC  . gabapentin  100 mg Oral TID  . gentamicin cream  1 application Topical Daily  . hydrALAZINE  100 mg Oral Q8H  . isosorbide dinitrate  5 mg Oral BID  . labetalol  300 mg Oral BID  . pantoprazole  40 mg Oral Daily  . pravastatin  20 mg Oral q1800    Dialysis:[facility] home therapies. Last admit  CCPD: 3L fill 5 exchanges at night with 5th fill manually drained 1-1/2 hours later. Uses mostly 1's and 2's at home  Assessment/Plan: 1. AMS: Multifactorial. Most likely 2/2 uremia/pain meds. Now almost back to base line after starting back on HD. BUN down to 33 Scr 6.4. Continue HD, CLIP process initiated. No immediate plans for resuming PD. Poor candidate for resumption of PD.  2. S/P BKA per Dr.  Sharol Given. Being transferred to in pt rehab today.  2. ESRD -Will continue on TTS schedule. K+ 3.5 use 4.0 K bath tomorrow. Tight heparin.  3. Anemia - HGB 9.1 today. Rec'd 2 units PRBCs 12/06/16. ESA ordered. Rec'd 200 mcg IV 12/05/16.  4. Secondary hyperparathyroidism - Cont sensipar, VDRA, binders. Phos 5.0 Ca 7.0 C Ca 8.9 5. HTN/volume - trace R stump edema, no LLE edema. HD 12/07/16 Pre wt    Net UF 3.0 Post wt 83. BP still elevated, continue lowering EDW as tolerated. Cont. Amlodipine, hydralazine, clonidine patch and labetalol.  6. 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 Renal diet/renal vit/add prostat.   Rita H. Brown NP-C 12/09/2016, 11:49 AM  [facility] [phone]  Pt seen, examined, agree w assess/plan as above with additions as indicated.  Kelly Splinter MD [employer] pager [phone]    cell [phone] 12/09/2016, 12:16 PM

## 2016-12-10 ENCOUNTER — Inpatient Hospital Stay (HOSPITAL_COMMUNITY): Payer: 59 | Admitting: Physical Therapy

## 2016-12-10 ENCOUNTER — Inpatient Hospital Stay (HOSPITAL_COMMUNITY): Payer: 59 | Admitting: Occupational Therapy

## 2016-12-10 ENCOUNTER — Inpatient Hospital Stay (HOSPITAL_COMMUNITY): Payer: 59 | Admitting: Speech Pathology

## 2016-12-10 DIAGNOSIS — I150 Renovascular hypertension: Secondary | ICD-10-CM

## 2016-12-10 DIAGNOSIS — E1142 Type 2 diabetes mellitus with diabetic polyneuropathy: Secondary | ICD-10-CM

## 2016-12-10 DIAGNOSIS — Z89611 Acquired absence of right leg above knee: Secondary | ICD-10-CM

## 2016-12-10 DIAGNOSIS — D62 Acute posthemorrhagic anemia: Secondary | ICD-10-CM

## 2016-12-10 DIAGNOSIS — N186 End stage renal disease: Secondary | ICD-10-CM

## 2016-12-10 DIAGNOSIS — Z992 Dependence on renal dialysis: Secondary | ICD-10-CM

## 2016-12-10 LAB — RENAL FUNCTION PANEL
Albumin: 1.7 g/dL — ABNORMAL LOW (ref 3.5–5.0)
Anion gap: 12 (ref 5–15)
BUN: 52 mg/dL — ABNORMAL HIGH (ref 6–20)
CO2: 22 mmol/L (ref 22–32)
Calcium: 7.1 mg/dL — ABNORMAL LOW (ref 8.9–10.3)
Chloride: 100 mmol/L — ABNORMAL LOW (ref 101–111)
Creatinine, Ser: 8.43 mg/dL — ABNORMAL HIGH (ref 0.61–1.24)
GFR calc Af Amer: 8 mL/min — ABNORMAL LOW (ref >60)
GFR calc non Af Amer: 7 mL/min — ABNORMAL LOW (ref >60)
Glucose, Bld: 105 mg/dL — ABNORMAL HIGH (ref 65–99)
Phosphorus: 5.7 mg/dL — ABNORMAL HIGH (ref 2.5–4.6)
Potassium: 3.9 mmol/L (ref 3.5–5.1)
Sodium: 134 mmol/L — ABNORMAL LOW (ref 135–145)

## 2016-12-10 LAB — CBC
HCT: 27.1 % — ABNORMAL LOW (ref 39.0–52.0)
Hemoglobin: 8.5 g/dL — ABNORMAL LOW (ref 13.0–17.0)
MCH: 26.6 pg (ref 26.0–34.0)
MCHC: 31.4 g/dL (ref 30.0–36.0)
MCV: 85 fL (ref 78.0–100.0)
Platelets: 508 10*3/uL — ABNORMAL HIGH (ref 150–400)
RBC: 3.19 MIL/uL — ABNORMAL LOW (ref 4.22–5.81)
RDW: 18.4 % — ABNORMAL HIGH (ref 11.5–15.5)
WBC: 14.9 K/uL — ABNORMAL HIGH (ref 4.0–10.5)

## 2016-12-10 MED ORDER — HEPARIN SODIUM (PORCINE) 1000 UNIT/ML DIALYSIS
20.0000 [IU]/kg | INTRAMUSCULAR | Status: DC | PRN
  Administered 2016-12-10: 1700 [IU] via INTRAVENOUS_CENTRAL
  Filled 2016-12-10 (×2): qty 2

## 2016-12-10 MED ORDER — ALTEPLASE 2 MG IJ SOLR: 2.0000 mg | Freq: Once | INTRAMUSCULAR | Status: DC | PRN

## 2016-12-10 MED ORDER — ACETAMINOPHEN 325 MG PO TABS
ORAL_TABLET | ORAL | Status: AC
  Filled 2016-12-10: qty 2

## 2016-12-10 MED ORDER — CLONIDINE HCL 0.1 MG PO TABS
0.1000 mg | ORAL_TABLET | Freq: Once | ORAL | Status: AC
  Administered 2016-12-10: 0.1 mg via ORAL
  Filled 2016-12-10: qty 1

## 2016-12-10 MED ORDER — CLONIDINE HCL 0.1 MG PO TABS
0.1000 mg | ORAL_TABLET | Freq: Once | ORAL | Status: AC
  Administered 2016-12-10: 0.1 mg via ORAL
  Filled 2016-12-10 (×2): qty 1

## 2016-12-10 MED ORDER — SODIUM CHLORIDE 0.9 % IV SOLN: 100.0000 mL | INTRAVENOUS | Status: DC | PRN

## 2016-12-10 NOTE — Evaluation (Signed)
Physical Therapy Assessment and Plan  Patient Details  Name: Hayden [name] MRN: [id number] Date of Birth: 1971-03-04  PT Diagnosis: Abnormality of gait, Difficulty walking, Impaired sensation, Muscle weakness and Pain in residual limb Rehab Potential: Good ELOS: 5-7 days   Today's Date: 12/10/2016 PT Individual Time:  - [phone] 60 minutes    Problem List:  Patient Active Problem List   Diagnosis Date Noted  . Type 2 diabetes mellitus with peripheral neuropathy (HCC)   . Amputee, above knee, right ([place]) 12/09/2016  . Chronic diastolic heart failure ([place])   . Type 2 diabetes mellitus with diabetic peripheral angiopathy without gangrene, without long-term current use of insulin ([place])   . Unilateral AKA, right ([place])   . ESRD on dialysis ([place])   . 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 Atherosclerosis of native arteries of extremities with gangrene, right leg ([place]) 12/02/2016  . Neuropathic pain   . Hypertensive crisis   . Labile blood pressure   . Type 2 diabetes mellitus with diabetic peripheral angiopathy and gangrene, without long-term current use of insulin ([place])   . Debility 11/22/2016  . Anemia of chronic disease   . Diabetes mellitus type 2 in nonobese (HCC)   . Confusion   . S/P femoral-popliteal bypass surgery   . Tobacco abuse   . Benign essential HTN   . History of CVA (cerebrovascular accident)   . Post-operative pain   . Acute respiratory failure with hypoxia ([place])   . Agitation 11/04/2016  . Acute encephalopathy 11/04/2016  . Cellulitis of right lower extremity   . Hypokalemia 11/02/2016  . QT prolongation   . Gangrene of right foot ([place])   . Pressure injury of skin 10/24/2016  . Cellulitis of great toe of right foot 10/23/2016  . Leukocytosis 10/23/2016  . Hyperkalemia 10/23/2016  . Abdominal hematoma 11/15/2015  . HCAP (healthcare-associated pneumonia) 11/15/2015  . Acute blood loss anemia 11/15/2015  . Absolute anemia   .  Right lower quadrant pain   . Pneumonia involving right lung 11/04/2015  . HTN (hypertension) 11/04/2015  . Noninfectious gastroenteritis and colitis 11/04/2015  . Gastroenteritis 11/04/2015  . CVA (cerebral infarction) 06/19/2015  . End stage renal disease on dialysis ([place]) 06/19/2015  . Lacunar infarct, acute ([place]) 06/19/2015  . GERD (gastroesophageal reflux disease) 06/19/2015  . Anxiety and depression 06/19/2015  . Hypertension, well controlled 06/19/2015  . End stage renal disease ([place]) 01/21/2013  . Acute coronary syndrome ([place]) 09/22/2012  . History of partial Right nephrectomy for renal mass (2008) 09/22/2012  . Left ventricular hypertrophy (moderate) per ECHO 2010 09/22/2012  . Chronic systolic congestive heart failure/EF 40% PER echo 2010 09/22/2012  . 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 Acute kidney injury ([place]) 09/21/2012  . Chest pain 09/21/2012    Past Medical History:  Past Medical History:  Diagnosis Date  . Anemia March 2014  . Cancer ([place])    Kidney  . CHF (congestive heart failure) ([place]) 9233   Acute systolic and diastolic CHF  . Depression    & rage --  was in counseling....great now  . Diabetes mellitus    NO DM SINCE LOST 130LBS  . ESRD on peritoneal dialysis ([place]) 2018   Started in-center HD approx 2015 for 2 years, then did about 1 year of home HD and then started peritoneal dialysis in early 2018.    . Gangrene ([place])    right leg and foot  . History of kidney cancer   .  Hyperlipidemia   . Hypertension   . Obesity   . PONV (postoperative nausea and vomiting)   . Stroke [facility])    Past Surgical History:  Past Surgical History:  Procedure Laterality Date  . ABDOMINAL AORTOGRAM W/LOWER EXTREMITY N/A 10/28/2016   Procedure: Abdominal Aortogram w/Lower Extremity;  Surgeon: Angelia Mould, MD;  Location: [facility];  Service: Cardiovascular;  Laterality: N/A;  . AMPUTATION Right 11/05/2016   Procedure: AMPUTATION RIGHT FIRST RAY;   Surgeon: Conrad Albert Lea, MD;  Location: [facility];  Service: Vascular;  Laterality: Right;  . AMPUTATION Right 12/04/2016   Procedure: RIGHT ABOVE KNEE AMPUTATION;  Surgeon: Newt Minion, MD;  Location: [place] [facility];  Service: Orthopedics;  Laterality: Right;  . AV FISTULA PLACEMENT Left 01/26/2013   Procedure: ARTERIOVENOUS (AV) FISTULA CREATION - LEFT RADIAL CEPHALIC AVF;  Surgeon: Angelia Mould, MD;  Location: [facility];  Service: Vascular;  Laterality: Left;  . CHOLECYSTECTOMY  11/06/2015   Procedure: LAPAROSCOPIC CHOLECYSTECTOMY;  Surgeon: Ralene Ok, MD;  Location: [place];  Service: General;;  . FEMORAL-POPLITEAL BYPASS GRAFT Right 11/05/2016   Procedure: BYPASS GRAFT FEMORAL-POPLITEAL ARTERY USING NON-REVERSED RIGHT GREATER SAPPHENOUS VEIN;  Surgeon: Conrad Poplar, MD;  Location: [facility];  Service: Vascular;  Laterality: Right;  . HERNIA REPAIR    . LOWER EXTREMITY ANGIOGRAM Right 10/30/2016   Procedure: Right  LOWER EXTREMITY ANGIOGRAM WITH RIGHT SUPERFICIAL FEMORAL ARTERY balloon angioplasty;  Surgeon: Conrad Wood-Ridge, MD;  Location: [facility];  Service: Vascular;  Laterality: Right;  . NEPHRECTOMY Right 2008   partial  . TESTICLE TORSION REDUCTION    . TONSILLECTOMY AND ADENOIDECTOMY    . VEIN HARVEST Right 11/05/2016   Procedure: RIGHT GREATER [place];  Surgeon: Conrad Phillips, MD;  Location: [facility];  Service: Vascular;  Laterality: Right;    Assessment & Plan Clinical Impression: Patient is a 46 y.o. year old male with recent admission to the hospital on Admitted 12/04/16 with necrotic changes right tibial muscle and flexion contracture at knee as well as recent fall on incision line and underwent R-AKA by Dr. Sharol Given 12/04/2016. Wound VAC as directed. History taken from chart review. Post op has had issues with confusion and lethargy due to encephalopathy. Mentation improved post HD.  Patient transferred to CIR on 12/09/2016 .   Patient currently requires min with mobility secondary to muscle  weakness and decreased standing balance, decreased postural control and decreased balance strategies.  Prior to hospitalization, patient was modified independent  with mobility and lived with Spouse, Son in a Mobile home home.  Home access is 4Stairs to enter.  Patient will benefit from skilled PT intervention to maximize safe functional mobility, minimize fall risk and decrease caregiver burden for planned discharge home with 24 hour supervision.  Anticipate patient will benefit from follow up [place] at discharge.  PT - End of Session Activity Tolerance: Tolerates 30+ min activity with multiple rests Endurance Deficit: Yes Endurance Deficit Description: Required rest breaks within session  PT Assessment Rehab Potential (ACUTE/IP ONLY): Good PT Patient demonstrates impairments in the following area(s): Balance;Edema;Endurance;Motor;Sensory;Safety;Pain PT Transfers Functional Problem(s): Bed Mobility;Bed to Chair;Car;Furniture PT Locomotion Functional Problem(s): Ambulation;Wheelchair Mobility PT Plan PT Intensity: Minimum of 1-2 x/day ,45 to 90 minutes PT Frequency: 5 out of 7 days PT Duration Estimated Length of Stay: 5-7 days PT Treatment/Interventions: Ambulation/gait training;Community reintegration;DME/adaptive equipment instruction;Neuromuscular re-education;Psychosocial support;Stair training;UE/LE Strength taining/ROM;UE/LE Coordination activities;Therapeutic Activities;Discharge planning;Balance/vestibular training;Functional mobility training;Patient/family education;Therapeutic Exercise;Pain management PT Transfers Anticipated Outcome(s):  mod I PT Locomotion Anticipated Outcome(s): supervision gait, mod I w/c PT Recommendation Recommendations for Other Services: Therapeutic Recreation consult Therapeutic Recreation Interventions: Stress management Follow Up Recommendations: Home health PT;24 hour supervision/assistance Patient destination: Home Equipment Recommended: None recommended  by PT  Skilled Therapeutic Intervention Pt participated in skilled PT eval and pt and wife were educated on PT POC and goals.  Pt performed transfers with cuing for safety and set up, supervision/min A.  Gait with RW 20' x 2 with min A.  Car transfer to simulated sedan with min A, max cues for safety. Pt with decreased awareness of deficits. Pt c/o phantom limb pain, PT educated pt and wife on rubbing/tapping to decrease phantom pain. Pt verbalizes that he does not want to "look at" his leg.  Emotional support offered.  Social worker made aware of need for neuropsych. Pt given info on support group.  Pt propels w/c throughout unit with mod I for mobility, cues for w/c parts management. Pt left in room with wife and with needs at hand.  PT Evaluation Precautions/Restrictions Precautions Precautions: Fall Restrictions Weight Bearing Restrictions: Yes RLE Weight Bearing: Non weight bearing Pain Pain Assessment Pain Assessment: 0-10 Pain Score: 9  Pain Type: Surgical pain Pain Location: Leg Pain Orientation: Right Pain Descriptors / Indicators: Aching Pain Onset: On-going Pain Intervention(s): Emotional support;Rest;RN made aware Home Living/Prior Functioning Home Living Available Help at Discharge: Family;Available 24 hours/day Type of Home: Mobile home Home Access: Stairs to enter Entrance Stairs-Number of Steps: 4 Entrance Stairs-Rails: Right;Left Home Layout: One level  Lives With: Spouse;Son Prior Function Level of Independence: Requires assistive device for independence  Able to Take Stairs?: No Driving: No Vocation: On disability  Cognition Overall Cognitive Status: History of cognitive impairments - at baseline Arousal/Alertness: Awake/alert Orientation Level: Oriented X4 Attention: Selective Selective Attention: Appears intact Memory: Impaired Memory Impairment: Decreased recall of new information Awareness: Appears intact Problem Solving: Appears intact Behaviors:  Poor frustration tolerance Safety/Judgment: Appears intact Sensation Sensation Light Touch: Impaired Detail Light Touch Impaired Details: Impaired RLE Coordination Gross Motor Movements are Fluid and Coordinated: Yes Fine Motor Movements are Fluid and Coordinated: Yes Motor  Motor Motor - Skilled Clinical Observations: generalized weakness   Trunk/Postural Assessment  Cervical Assessment Cervical Assessment: Within Functional Limits Thoracic Assessment Thoracic Assessment: Within Functional Limits Lumbar Assessment Lumbar Assessment: Within Functional Limits Postural Control Postural Control: Deficits on evaluation Righting Reactions: delayed  Balance Dynamic Sitting Balance Dynamic Sitting - Balance Support: During functional activity Dynamic Sitting - Level of Assistance: 5: Stand by assistance Dynamic Standing Balance Dynamic Standing - Balance Support: During functional activity Dynamic Standing - Level of Assistance: 4: Min assist Extremity Assessment      RLE AROM (degrees) RLE Overall AROM Comments: limited by pain, but WFL RLE Strength RLE Overall Strength Comments: limited by pain, grossly 3-/5 LLE Assessment LLE Assessment: Within Functional Limits   See Function Navigator for Current Functional Status.   Refer to Care Plan for Long Term Goals  Recommendations for other services: Therapeutic Recreation  Stress management  Discharge Criteria: Patient will be discharged from PT if patient refuses treatment 3 consecutive times without medical reason, if treatment goals not met, if there is a change in medical status, if patient makes no progress towards goals or if patient is discharged from hospital.  The above assessment, treatment plan, treatment alternatives and goals were discussed and mutually agreed upon: by patient and by family  Tangy Drozdowski 12/10/2016, 12:19 PM

## 2016-12-10 NOTE — Progress Notes (Signed)
[facility] PHYSICAL MEDICINE & REHABILITATION     PROGRESS NOTE  Subjective/Complaints:  Pt seen sitting up in bed this AM.  He slept well overnight and is ready to begin therapies.  ROS: Denies CP, SOB, N/V/D.  Objective: Vital Signs: Blood pressure (!) 175/71, pulse 80, temperature 98.2 F (36.8 C), temperature source Oral, resp. rate 20, height 5\' 9"  (1.753 m), weight 88.2 kg (194 lb 7.1 oz), SpO2 95 %. No results found.  Recent Labs  12/09/16 0237  WBC 13.1*  HGB 9.1*  HCT 29.7*  PLT 555*    Recent Labs  12/09/16 0237  NA 135  K 3.5  CL 99*  GLUCOSE 106*  BUN 33*  CREATININE 6.40*  CALCIUM 7.0*   CBG (last 3)  No results for input(s): GLUCAP in the last 72 hours.  Wt Readings from Last 3 Encounters:  12/10/16 88.2 kg (194 lb 7.1 oz)  12/08/16 83.4 kg (183 lb 13.8 oz)  12/02/16 86.6 kg (191 lb)    Physical Exam:  BP (!) 175/71 (BP Location: Right Arm)   Pulse 80   Temp 98.2 F (36.8 C) (Oral)   Resp 20   Ht 5\' 9"  (1.753 m)   Wt 88.2 kg (194 lb 7.1 oz)   SpO2 95%   BMI 28.71 kg/m  Constitutional: He appears well-developed and well-nourished.  HENT: Normocephalic and atraumatic.  Eyes: EOMI. No discharge.  Cardiovascular: Normal rate and regular rhythm.  No JVD. Respiratory: Effort normal and breath sounds normal.  GI: Soft. Bowel sounds are normal.  Musculoskeletal: He exhibits tenderness. He exhibits no edema.  Neurological:  Alert  Follows simple commands. Motor: B/l UE, LLE: 5/5 proximal to distal RLE: HF 4/5  Sensation intact to light touch  Generalized twitching  Skin. AKA site with wound VAC in place appropriately tender. Multiple tattoos on body  Psych: Atypical affect  Assessment/Plan: 1. 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 Physiatrist is providing close team supervision and 24 hour management  of active medical problems listed below. Physiatrist and rehab team continue to assess barriers to discharge/monitor patient progress toward functional and medical goals.  Function:  Bathing Bathing position      Bathing parts      Bathing assist        Upper Body Dressing/Undressing Upper body dressing                    Upper body assist        Lower Body Dressing/Undressing Lower body dressing                                  Lower body assist        Toileting Toileting          Toileting assist     Transfers Chair/bed transfer     Chair/bed transfer assist level: Touching or steadying assistance (Pt > 75%)       Locomotion Ambulation     Max distance: 15 Assist level: Touching or steadying assistance (Pt > 75%)   Wheelchair   Type: Manual Max wheelchair distance: 281ft  Assist Level: Supervision or verbal cues  Cognition Comprehension Comprehension assist level: Follows complex conversation/direction with extra time/assistive device, Understands complex 90% of the time/cues 10% of the time, Understands basic 90% of the  time/cues < 10% of the time  Expression    Social Interaction Social Interaction assist level: Interacts appropriately with others with medication or extra time (anti-anxiety, antidepressant).  Problem Solving Problem solving assist level: Solves basic 90% of the time/requires cueing < 10% of the time  Memory Memory assist level: Recognizes or recalls 90% of the time/requires cueing < 10% of the time    Medical Problem List and Plan: 1.  Decreased functional mobility secondary to right AKA 12/04/2016 with postop delirium  Begin CIR 2.  DVT Prophylaxis/Anticoagulation: SCD left lower extremity 3. Pain Management: Neurontin 100 mg 3 times a day, Hydrocodone and Robaxin as needed. Monitor mental status  Monitor with increased mobility 4. Mood: Lexapro 10 mg daily, Depakote 500 mg every 12 hours, Ativan 1 mg every 6  hours as needed 5. Neuropsych: This patient is not capable of making decisions on his own behalf. 6. Skin/Wound Care: Routine skin checks 7. Fluids/Electrolytes/Nutrition: Routine I&Os 8. Acute blood loss anemia. Continue Aranesp  Hb 9.1 on 6/11  Cont to monitor 9. ESRD. Hemodialysis as directed 10. Hypertension. Norvasc 10 mg daily, clonidine patch 0.2 mg weekly, hydralazine 100 mg every 8 hours, Isordil 5 mg twice a day, labetalol 300 mg twice a day. Monitor with increased mobility  Monitor with increased mobility 11. Diastolic congestive heart failure. Monitor for any signs of fluid overload Filed Weights   12/09/16 1458 12/10/16 0500  Weight: 87.9 kg (193 lb 12.6 oz) 88.2 kg (194 lb 7.1 oz)  12. Hyperlipidemia. Pravachol 20 mg daily 13. Diabetes mellitus with peripheral neuropathy. Latest hemoglobin A1c 5.4. Check blood sugars before meals and at bedtime  Relatively controlled 6/12 14. Tobacco abuse. Counseling  LOS (Days) 1 A FACE TO FACE EVALUATION WAS PERFORMED  Borden Thune Lorie Phenix 12/10/2016 10:37 AM

## 2016-12-10 NOTE — Plan of Care (Signed)
Problem: RH PAIN MANAGEMENT Goal: RH STG PAIN MANAGED AT OR BELOW PT'S PAIN GOAL Less than 3   Outcome: Not Progressing Patient rating pain at above three consistently.

## 2016-12-10 NOTE — Evaluation (Signed)
Speech Language Pathology Assessment and Plan  Patient Details  Name: Hayden [name] MRN: [id number] Date of Birth: [birth year]  Today's Date: 12/10/2016 SLP Individual Time: 1030-1120 SLP Individual Time Calculation (min): 50 min   Problem List:  Patient Active Problem List   Diagnosis Date Noted  . Type 2 diabetes mellitus with peripheral neuropathy (HCC)   . Amputee, above knee, right ([place]) 12/09/2016  . Chronic diastolic heart failure ([place])   . Type 2 diabetes mellitus with diabetic peripheral angiopathy without gangrene, without long-term current use of insulin ([place])   . Unilateral AKA, right ([place])   . ESRD on dialysis ([place])   . 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 Atherosclerosis of native arteries of extremities with gangrene, right leg ([place]) 12/02/2016  . Neuropathic pain   . Hypertensive crisis   . Labile blood pressure   . Type 2 diabetes mellitus with diabetic peripheral angiopathy and gangrene, without long-term current use of insulin ([place])   . Debility 11/22/2016  . Anemia of chronic disease   . Diabetes mellitus type 2 in nonobese (HCC)   . Confusion   . S/P femoral-popliteal bypass surgery   . Tobacco abuse   . Benign essential HTN   . History of CVA (cerebrovascular accident)   . Post-operative pain   . Acute respiratory failure with hypoxia ([place])   . Agitation 11/04/2016  . Acute encephalopathy 11/04/2016  . Cellulitis of right lower extremity   . Hypokalemia 11/02/2016  . QT prolongation   . Gangrene of right foot ([place])   . Pressure injury of skin 10/24/2016  . Cellulitis of great toe of right foot 10/23/2016  . Leukocytosis 10/23/2016  . Hyperkalemia 10/23/2016  . Abdominal hematoma 11/15/2015  . HCAP (healthcare-associated pneumonia) 11/15/2015  . Acute blood loss anemia 11/15/2015  . Absolute anemia   . Right lower quadrant pain   . Pneumonia involving right lung 11/04/2015  . HTN (hypertension) 11/04/2015  .  Noninfectious gastroenteritis and colitis 11/04/2015  . Gastroenteritis 11/04/2015  . CVA (cerebral infarction) 06/19/2015  . End stage renal disease on dialysis ([place]) 06/19/2015  . Lacunar infarct, acute ([place]) 06/19/2015  . GERD (gastroesophageal reflux disease) 06/19/2015  . Anxiety and depression 06/19/2015  . Hypertension, well controlled 06/19/2015  . End stage renal disease ([place]) 01/21/2013  . Acute coronary syndrome ([place]) 09/22/2012  . History of partial Right nephrectomy for renal mass (2008) 09/22/2012  . Left ventricular hypertrophy (moderate) per ECHO 2010 09/22/2012  . Chronic systolic congestive heart failure/EF 40% PER echo 2010 09/22/2012  . 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 Acute kidney injury ([place]) 09/21/2012  . Chest pain 09/21/2012   Past Medical History:  Past Medical History:  Diagnosis Date  . Anemia March 2014  . Cancer ([place])    Kidney  . CHF (congestive heart failure) ([place]) 4709   Acute systolic and diastolic CHF  . Depression    & rage --  was in counseling....great now  . Diabetes mellitus    NO DM SINCE LOST 130LBS  . ESRD on peritoneal dialysis ([place]) 2018   Started in-center HD approx 2015 for 2 years, then did about 1 year of home HD and then started peritoneal dialysis in early 2018.    . Gangrene ([place])    right leg and foot  . History of kidney cancer   . Hyperlipidemia   . Hypertension   . Obesity   . PONV (postoperative nausea and vomiting)   .  Stroke [facility])    Past Surgical History:  Past Surgical History:  Procedure Laterality Date  . ABDOMINAL AORTOGRAM W/LOWER EXTREMITY N/A 10/28/2016   Procedure: Abdominal Aortogram w/Lower Extremity;  Surgeon: Angelia Mould, MD;  Location: [place] CV [facility];  Service: Cardiovascular;  Laterality: N/A;  . AMPUTATION Right 11/05/2016   Procedure: AMPUTATION RIGHT FIRST RAY;  Surgeon: Conrad Kelso, MD;  Location: [place];  Service: Vascular;  Laterality: Right;  . AMPUTATION Right  12/04/2016   Procedure: RIGHT ABOVE KNEE AMPUTATION;  Surgeon: Newt Minion, MD;  Location: [place];  Service: Orthopedics;  Laterality: Right;  . AV FISTULA PLACEMENT Left 01/26/2013   Procedure: ARTERIOVENOUS (AV) FISTULA CREATION - LEFT RADIAL CEPHALIC AVF;  Surgeon: Angelia Mould, MD;  Location: [place];  Service: Vascular;  Laterality: Left;  . CHOLECYSTECTOMY  11/06/2015   Procedure: LAPAROSCOPIC CHOLECYSTECTOMY;  Surgeon: Ralene Ok, MD;  Location: [place];  Service: General;;  . FEMORAL-POPLITEAL BYPASS GRAFT Right 11/05/2016   Procedure: BYPASS GRAFT FEMORAL-POPLITEAL ARTERY USING NON-REVERSED RIGHT GREATER SAPPHENOUS VEIN;  Surgeon: Conrad Beard[place], MD;  Location: [place];  Service: Vascular;  Laterality: Right;  . HERNIA REPAIR    . LOWER EXTREMITY ANGIOGRAM Right 10/30/2016   Procedure: Right  LOWER EXTREMITY ANGIOGRAM WITH RIGHT SUPERFICIAL FEMORAL ARTERY balloon angioplasty;  Surgeon: Conrad Wolcott, MD;  Location: [place];  Service: Vascular;  Laterality: Right;  . NEPHRECTOMY Right 2008   partial  . TESTICLE TORSION REDUCTION    . TONSILLECTOMY AND ADENOIDECTOMY    . VEIN HARVEST Right 11/05/2016   Procedure: RIGHT GREATER SAPPHENOUS VEIN HARVEST;  Surgeon: Conrad Hyndman, MD;  Location: [place];  Service: Vascular;  Laterality: Right;    Assessment / Plan / Recommendation Clinical Impression   Hayden [name] a 46 y.o.malewith history of diastolic congestive heart failure, tobacco abuse, ESRD, T2DM, PVD s/p revascularization (recent CIR admission 11/20/2016-11/28/2016 for debilitation after right first digit amputation lower extremity). Patient lives with spouse mobile home with 4 steps to entry. Wife works a swing shift.  Admitted 12/04/16 with necrotic changes right tibial muscle and flexion contracture at knee as well as recent fall on incision line and underwent R-AKA by Dr. Sharol Given 12/04/2016. Wound VAC as directed. History taken from chart review. Post op has had issues with confusion  and lethargy due to encephalopathy. Mentation improved post HD and was able to participate in therapy, however, appears to wax/wane.  Hospital course complicated by post-op pain, leukocytosis, ABLA. CIR recommended due to decline in functional mobility. Patient was admitted for comprehensive rehabilitation program.  SLP evaluation was completed on 12/10/2016 with the following results:  Pt presents with cognitive impairments consistent with reports of baseline functioning.  Pt was administered an alternative version of the [employer] standardized cognitive assessment and scored 24 out of 30 which is similar to his initial score on last admission to CIR.  Pt and wife both deny any changes in mentation with this acute hospitalization.  As a result, no further ST needs are indicated at this time.     Skilled Therapeutic Interventions          Cognitive-linguistic evaluation completed with results and recommendations reviewed with patient and family.     SLP Assessment  Patient does not need any further Speech [place] Pathology Services    Recommendations  Recommendations for Other Services: Neuropsych consult Patient destination: Home Follow up Recommendations: None Equipment Recommended: None recommended by SLP  Pain Pain Assessment Pain Assessment: 0-10 Pain Score: 7  Pain Type: Acute pain Pain Location: Generalized Pain Orientation: Right Pain Frequency: Constant Pain Onset: On-going Patients Stated Pain Goal: 4 Pain Intervention(s): Other (Comment) (pt's wife reported pt was not due for pain meds for another hour )  Prior Functioning Cognitive/Linguistic Baseline: Baseline deficits Baseline deficit details: pt and wife report baseline memory deficits  Type of Home: Mobile home  Lives With: Spouse;Son Available Help at Discharge: Family;Available 24 hours/day Vocation: On disability  Function:  Eating Eating                 Cognition Comprehension  Comprehension assist level: Follows complex conversation/direction with extra time/assistive device  Expression   Expression assist level: Expresses complex ideas: With extra time/assistive device  Social Interaction Social Interaction assist level: Interacts appropriately with others with medication or extra time (anti-anxiety, antidepressant).  Problem Solving Problem solving assist level: Solves basic 90% of the time/requires cueing < 10% of the time  Memory Memory assist level: Recognizes or recalls 90% of the time/requires cueing < 10% of the time    Refer to Care Plan for Long Term Goals  Recommendations for other services: Neuropsych  Discharge Criteria: Patient will be discharged from SLP if patient refuses treatment 3 consecutive times without medical reason, if treatment goals not met, if there is a change in medical status, if patient makes no progress towards goals or if patient is discharged from hospital.  The above assessment, treatment plan, treatment alternatives and goals were discussed and mutually agreed upon: by patient and by family  Emilio Math 12/10/2016, 11:29 AM

## 2016-12-10 NOTE — Progress Notes (Signed)
[name] Progress Note   Subjective: looking much better.   Objective Vitals:   12/09/16 1458 12/09/16 2148 12/10/16 0500  BP: (!) 160/67 (!) 173/73 (!) 175/71  Pulse: 74 86 80  Resp: 18 18 20   Temp: 98.9 F (37.2 C)  98.2 F (36.8 C)  TempSrc: Oral  Oral  SpO2: 98% 98% 95%  Weight: 87.9 kg (193 lb 12.6 oz)  88.2 kg (194 lb 7.1 oz)  Height: 5\' 9"  (1.753 m)     Physical Exam General: Awake, pleasant and cooperative. NAD Heart: P3,I9, 2/6 systolic M Lungs: CTAB A/P Abdomen: soft, active BS Extremities: No LE edema. R AKA with wound vac in place. Trace stump edema.  Dialysis Access: PD cath LUQ drsg current CDI. LUA AVF + bruit    Additional Objective Labs: Basic Metabolic Panel:  Recent Labs Lab 12/05/16 1121 12/05/16 2039 12/06/16 0801 12/09/16 0237  NA 135  --  134* 135  K 4.2  --  4.1 3.5  CL 96*  --  96* 99*  CO2 21*  --  25 26  GLUCOSE 92  --  88 106*  BUN 109*  --  61* 33*  CREATININE 10.59*  --  7.46* 6.40*  CALCIUM 6.8*  --  6.9* 7.0*  PHOS 11.5* 6.1* 7.6* 5.0*   Liver Function Tests:  Recent Labs Lab 12/04/16 0743 12/05/16 1121 12/06/16 0801 12/09/16 0237  AST 21  --   --   --   ALT 23  --   --   --   ALKPHOS 234*  --   --   --   BILITOT 0.7  --   --   --   PROT 5.6*  --   --   --   ALBUMIN 1.9* 1.5* 1.6* 1.6*   No results for input(s): LIPASE, AMYLASE in the last 168 hours. CBC:  Recent Labs Lab 12/04/16 0743 12/05/16 1121 12/06/16 1118 12/06/16 1632 12/09/16 0237  WBC 12.2* 11.2* 10.3  --  13.1*  HGB 8.1* 7.1* 6.6* 8.7* 9.1*  HCT 26.1* 22.7* 21.3* 27.1* 29.7*  MCV 82.3 82.5 83.5  --  85.8  PLT 640* 537* 498*  --  555*   Blood Culture    Component Value Date/Time   SDES FLUID PERITONEAL DIALYSIS 11/09/2016 0836   SDES FLUID PERITONEAL DIALYSIS 11/09/2016 0836   SPECREQUEST NONE 11/09/2016 0836   SPECREQUEST NONE 11/09/2016 0836   CULT NO GROWTH 5 DAYS 11/09/2016 0836   REPTSTATUS 11/14/2016 FINAL  11/09/2016 0836   REPTSTATUS 11/09/2016 FINAL 11/09/2016 0836    Cardiac Enzymes: No results for input(s): CKTOTAL, CKMB, CKMBINDEX, TROPONINI in the last 168 hours. CBG:  Recent Labs Lab 12/05/16 1140 12/05/16 2129 12/06/16 0644 12/06/16 1625 12/06/16 2010  GLUCAP 93 81 95 76 73   Iron Studies: No results for input(s): IRON, TIBC, TRANSFERRIN, FERRITIN in the last 72 hours. @lablastinr3 @ Studies/Results: No results found. Medications: . methocarbamol (ROBAXIN)  IV     . amLODipine  10 mg Oral Daily  . aspirin EC  325 mg Oral Daily  . calcitRIOL  0.5 mcg Oral Daily  . cinacalcet  60 mg Oral QPC supper  . [START ON 12/12/2016] cloNIDine  0.2 mg Transdermal Q Thu  . [START ON 12/12/2016] darbepoetin (ARANESP) injection - NON-DIALYSIS  200 mcg Subcutaneous Q Thu-1800  . divalproex  500 mg Oral Q12H  . docusate sodium  100 mg Oral BID  . escitalopram  10 mg Oral Daily  . feeding  supplement (NEPRO CARB STEADY)  237 mL Oral BID BM  . ferric citrate  420 mg Oral TID WC  . gabapentin  100 mg Oral TID  . hydrALAZINE  100 mg Oral Q8H  . isosorbide dinitrate  5 mg Oral BID  . labetalol  300 mg Oral BID  . pantoprazole  40 mg Oral Daily  . pravastatin  20 mg Oral q1800    Dialysis:[facility]. Last admit  CCPD: 3L fill 5 exchanges at night with 5th fill manually drained 1-1/2 hours later. Uses mostly 1's and 2's at home  Assess: 1. AMS: due to uremia from improper OP PD most likely. Now back to base line after starting back on HD. BUN down to 33 Scr 6.4. Continue HD, CLIP process initiated. No immediate plans for resuming PD.  2. S/P BKA per Dr. Sharol Given. On Rehab unit now.  2. ESRD -cont TTS HD 3. Anemia - HGB 9.1 today. Rec'd 2 units PRBCs 12/06/16. ESA ordered. Rec'd 200 mcg IV 12/05/16.  4. Secondary hyperparathyroidism - Cont sensipar, VDRA, binders. Phos 5.0 Ca 7.0 C Ca 8.9 5. HTN/volume - trace R stump edema, no LLE edema. We dont' have a dry wt, has tolerated gradual  wt reduction with HD. Cont. Amlodipine, hydralazine, clonidine patch and labetalol.  6. 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 Renal diet/renal vit/add prostat.   Plan - cont HD TTS for now   Kelly Splinter MD [facility] pgr [phone]   12/10/2016, 1:12 PM

## 2016-12-10 NOTE — Progress Notes (Signed)
Patient arrived to unit per bed.  Reviewed treatment plan and this RN agrees.  Report received from bedside RN, Vikki Ports.  Consent verified.  Patient A & O X 4. Lung sounds diminished to ausculation in all fields. Generalized non pitting edema. Cardiac: Regular R & R.  Prepped LLAVF with alcohol and cannulated with two 15 gauge needles.  Pulsation of blood noted.  Flushed access well with saline per protocol.  Connected and secured lines and initiated tx at 1511.  UF goal of 3500 mL and net fluid removal of 3000 mL.  Will continue to monitor.

## 2016-12-10 NOTE — IPOC Note (Signed)
Overall Plan of Care [facility]) Patient Details Name: Hayden [name] MRN: [id number] DOB: Apr 08, 1971  Admitting Diagnosis: R AKA  esrd  Hospital Problems: Active Problems:   Amputee, above knee, right ([place])   Type 2 diabetes mellitus with peripheral neuropathy ([place])   Hypertensive urgency     Functional Problem List: Nursing Skin Integrity, Pain, Nutrition, Edema, Medication Management, Safety, Sensory  PT Balance, Edema, Endurance, Motor, Sensory, Safety, Pain  OT Balance, Safety, Motor, Pain  SLP    TR         Basic ADL's: OT Grooming, Bathing, Dressing, Toileting     Advanced  ADL's: OT       Transfers: PT Bed Mobility, Bed to Chair, Car, Manufacturing systems engineer, Metallurgist: PT Ambulation, Emergency planning/management officer     Additional Impairments: OT None  SLP        TR      Anticipated Outcomes Item Anticipated Outcome  Self Feeding    Swallowing      Basic self-care  supervision  Toileting  supervision   Bathroom Transfers supervision   Bowel/Bladder  Pt will manage bowel and bladder with mod I assist   Transfers  mod I  Locomotion  supervision gait, mod I w/c  Communication     Cognition     Pain  Less than 3 out of 10.   Safety/Judgment  Pt will remain free of falls and injury with min assist    Therapy Plan: PT Intensity: Minimum of 1-2 x/day ,45 to 90 minutes PT Frequency: 5 out of 7 days PT Duration Estimated Length of Stay: 5-7 days OT Intensity: Minimum of 1-2 x/day, 45 to 90 minutes OT Frequency: 5 out of 7 days OT Duration/Estimated Length of Stay: 5-7 days          Team Interventions: Nursing Interventions Patient/Family Education, Medication Management, Disease Management/Prevention, Pain Management, Skin Care/Wound Management, Discharge Planning  PT interventions Ambulation/gait training, Community reintegration, DME/adaptive equipment instruction, Neuromuscular re-education, Psychosocial support, Stair training, UE/LE  Strength taining/ROM, UE/LE Coordination activities, Therapeutic Activities, Discharge planning, Training and development officer, Functional mobility training, Patient/family education, Therapeutic Exercise, Pain management  OT Interventions Balance/vestibular training, Discharge planning, Pain management, Self Care/advanced ADL retraining, Therapeutic Activities, UE/LE Coordination activities, Functional mobility training, Disease mangement/prevention, Patient/family education, Therapeutic Exercise, Skin care/wound managment, Community reintegration, Engineer, drilling, Psychosocial support, UE/LE Strength taining/ROM, Wheelchair propulsion/positioning  SLP Interventions    TR Interventions    SW/CM Interventions Discharge Planning, Barrister's clerk, Patient/Family Education    Team Discharge Planning: Destination: PT-Home ,OT- Home , SLP-Home Projected Follow-up: PT-Home health PT, 24 hour supervision/assistance, OT-  Home health OT, SLP-None Projected Equipment Needs: PT-None recommended by PT, OT- 3 in 1 bedside comode, SLP-None recommended by SLP Equipment Details: PT- , OT-anticipate needing drop arm BSC  Patient/family involved in discharge planning: PT- Patient, Family member/caregiver,  OT-Patient, SLP-Patient, Family member/caregiver  MD ELOS: 5-7 days. Medical Rehab Prognosis:  Good Assessment: 45 y.o.malewith history of diastolic congestive heart failure, tobacco abuse, ESRD, T2DM, PVD s/p revascularization (recent CIR admission 11/20/2016-11/28/2016 for debilitation after right first digit amputation lower extremity). Admitted 12/04/16 with necrotic changes right tibial muscle and flexion contracture at knee as well as recent fall on incision line and underwent R-AKA by Dr. Sharol Given 12/04/2016. Wound VAC as directed. Post op has had issues with confusion and lethargy due to encephalopathy. Mentation improved post HD and was able to participate in therapy, however, appears to  wax/wane.  Hospital course  complicated by post-op pain, leukocytosis, ABLA. CIR recommended due to decline in functional mobility. Will set goals for supervision for most tasks with PT/OT.  See Team Conference Notes for weekly updates to the plan of care

## 2016-12-10 NOTE — Progress Notes (Signed)
Dialysis treatment completed.  3500 mL ultrafiltrated and net fluid removal 3000 mL.    Patient status unchanged. Lung sounds diminished to ausculation in all fields. Generalized edema. Cardiac: NSr.  Disconnected lines and removed needles.  Pressure held for 10 minutes and band aid/gauze dressing applied.  Report given to bedside RN, Katharine Look.    Nephrology notified of hypertension post tx, non symptomatic.  Telephone order received.  Will continue to monitor.

## 2016-12-10 NOTE — Progress Notes (Signed)
Patient information reviewed and entered into eRehab system by Aashka Salomone, RN, CRRN, PPS Coordinator.  Information including medical coding and functional independence measure will be reviewed and updated through discharge.     Per nursing patient was given "Data Collection Information Summary for Patients in Inpatient Rehabilitation Facilities with attached "Privacy Act Statement-Health Care Records" upon admission.  

## 2016-12-10 NOTE — Evaluation (Signed)
Occupational Therapy Assessment and Plan  Patient Details  Name: Hayden [name] MRN: [id number] Date of Birth: 1971-03-06  OT Diagnosis: acute pain and muscle weakness (generalized)  Rehab Potential: Rehab Potential (ACUTE ONLY): Good ELOS: 5-7 days    Today's Date: 12/10/2016 OT Individual Time: 8119-1478 OT Individual Time Calculation (min): 76 min     Problem List:  Patient Active Problem List   Diagnosis Date Noted  . Type 2 diabetes mellitus with peripheral neuropathy (HCC)   . Amputee, above knee, right ([place]) 12/09/2016  . Chronic diastolic heart failure ([place])   . Type 2 diabetes mellitus with diabetic peripheral angiopathy without gangrene, without long-term current use of insulin ([place])   . Unilateral AKA, right ([place])   . ESRD on dialysis ([place])   . 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 Atherosclerosis of native arteries of extremities with gangrene, right leg ([place]) 12/02/2016  . Neuropathic pain   . Hypertensive crisis   . Labile blood pressure   . Type 2 diabetes mellitus with diabetic peripheral angiopathy and gangrene, without long-term current use of insulin ([place])   . Debility 11/22/2016  . Anemia of chronic disease   . Diabetes mellitus type 2 in nonobese (HCC)   . Confusion   . S/P femoral-popliteal bypass surgery   . Tobacco abuse   . Benign essential HTN   . History of CVA (cerebrovascular accident)   . Post-operative pain   . Acute respiratory failure with hypoxia ([place])   . Agitation 11/04/2016  . Acute encephalopathy 11/04/2016  . Cellulitis of right lower extremity   . Hypokalemia 11/02/2016  . QT prolongation   . Gangrene of right foot ([place])   . Pressure injury of skin 10/24/2016  . Cellulitis of great toe of right foot 10/23/2016  . Leukocytosis 10/23/2016  . Hyperkalemia 10/23/2016  . Abdominal hematoma 11/15/2015  . HCAP (healthcare-associated pneumonia) 11/15/2015  . Acute blood loss anemia 11/15/2015  . Absolute  anemia   . Right lower quadrant pain   . Pneumonia involving right lung 11/04/2015  . HTN (hypertension) 11/04/2015  . Noninfectious gastroenteritis and colitis 11/04/2015  . Gastroenteritis 11/04/2015  . CVA (cerebral infarction) 06/19/2015  . End stage renal disease on dialysis ([place]) 06/19/2015  . Lacunar infarct, acute ([place]) 06/19/2015  . GERD (gastroesophageal reflux disease) 06/19/2015  . Anxiety and depression 06/19/2015  . Hypertension, well controlled 06/19/2015  . End stage renal disease ([place]) 01/21/2013  . Acute coronary syndrome ([place]) 09/22/2012  . History of partial Right nephrectomy for renal mass (2008) 09/22/2012  . Left ventricular hypertrophy (moderate) per ECHO 2010 09/22/2012  . Chronic systolic congestive heart failure/EF 40% PER echo 2010 09/22/2012  . 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 Acute kidney injury ([place]) 09/21/2012  . Chest pain 09/21/2012    Past Medical History:  Past Medical History:  Diagnosis Date  . Anemia March 2014  . Cancer ([place])    Kidney  . CHF (congestive heart failure) ([place]) 2956   Acute systolic and diastolic CHF  . Depression    & rage --  was in counseling....great now  . Diabetes mellitus    NO DM SINCE LOST 130LBS  . ESRD on peritoneal dialysis ([place]) 2018   Started in-center HD approx 2015 for 2 years, then did about 1 year of home HD and then started peritoneal dialysis in early 2018.    . Gangrene ([place])    right leg and foot  . History of kidney  cancer   . Hyperlipidemia   . Hypertension   . Obesity   . PONV (postoperative nausea and vomiting)   . Stroke [facility])    Past Surgical History:  Past Surgical History:  Procedure Laterality Date  . ABDOMINAL AORTOGRAM W/LOWER EXTREMITY N/A 10/28/2016   Procedure: Abdominal Aortogram w/Lower Extremity;  Surgeon: Angelia Mould, MD;  Location: [place] [facility];  Service: Cardiovascular;  Laterality: N/A;  . AMPUTATION Right 11/05/2016   Procedure: AMPUTATION  RIGHT FIRST RAY;  Surgeon: Conrad Belington, MD;  Location: [place];  Service: Vascular;  Laterality: Right;  . AMPUTATION Right 12/04/2016   Procedure: RIGHT ABOVE KNEE AMPUTATION;  Surgeon: Newt Minion, MD;  Location: [place];  Service: Orthopedics;  Laterality: Right;  . AV FISTULA PLACEMENT Left 01/26/2013   Procedure: ARTERIOVENOUS (AV) FISTULA CREATION - LEFT RADIAL CEPHALIC AVF;  Surgeon: Angelia Mould, MD;  Location: [place];  Service: Vascular;  Laterality: Left;  . CHOLECYSTECTOMY  11/06/2015   Procedure: LAPAROSCOPIC CHOLECYSTECTOMY;  Surgeon: Ralene Ok, MD;  Location: [place];  Service: General;;  . FEMORAL-POPLITEAL BYPASS GRAFT Right 11/05/2016   Procedure: BYPASS GRAFT FEMORAL-POPLITEAL ARTERY USING NON-REVERSED RIGHT GREATER SAPPHENOUS VEIN;  Surgeon: Conrad Juneau, MD;  Location: [place];  Service: Vascular;  Laterality: Right;  . HERNIA REPAIR    . LOWER EXTREMITY ANGIOGRAM Right 10/30/2016   Procedure: Right  LOWER EXTREMITY ANGIOGRAM WITH RIGHT SUPERFICIAL FEMORAL ARTERY balloon angioplasty;  Surgeon: Conrad [place], MD;  Location: [place];  Service: Vascular;  Laterality: Right;  . NEPHRECTOMY Right 2008   partial  . TESTICLE TORSION REDUCTION    . TONSILLECTOMY AND ADENOIDECTOMY    . VEIN HARVEST Right 11/05/2016   Procedure: RIGHT GREATER [place];  Surgeon: Conrad [place], MD;  Location[facility];  Service: Vascular;  Laterality: Right;    Assessment & Plan Clinical Impression: Patient is a 46 y.o. year old male with history of diastolic congestive heart failure, tobacco abuse, ESRD, T2DM, PVD s/p revascularization (recent CIR admission 11/20/2016-11/28/2016 for debilitation after right first digit amputation lower extremity). Patient lives with spouse mobile home with 4 steps to entry. Wife works a swing shift.  Admitted 12/04/16 with necrotic changes right tibial muscle and flexion contracture at knee as well as recent fall on incision line and underwent R-AKA by Dr. Sharol Given  12/04/2016. Wound VAC as directed. History taken from chart review. Post op has had issues with confusion and lethargy due to encephalopathy. Mentation improved post HD and was able to participate in therapy, however, appears to wax/wane.  Hospital course complicated by post-op pain, leukocytosis, ABLA. Patient transferred to CIR on 12/09/2016 .    Patient currently requires mod with basic self-care skills secondary to muscle weakness.  Prior to hospitalization, patient could complete basic ADLs with modified independent .  Patient will benefit from skilled intervention to decrease level of assist with basic self-care skills and increase independence with basic self-care skills prior to discharge home with care partner.  Anticipate patient will require 24 hour supervision and follow up home health.  OT - End of Session Activity Tolerance: Tolerates 30+ min activity with multiple rests Endurance Deficit: Yes OT Assessment Rehab Potential (ACUTE ONLY): Good OT Patient demonstrates impairments in the following area(s): Balance;Safety;Motor;Pain OT Basic ADL's Functional Problem(s): Grooming;Bathing;Dressing;Toileting OT Transfers Functional Problem(s): Toilet;Tub/Shower OT Additional Impairment(s): None OT Plan OT Intensity: Minimum of 1-2 x/day, 45 to 90 minutes OT Frequency: 5 out of 7 days OT  Duration/Estimated Length of Stay: 5-7 days  OT Treatment/Interventions: Balance/vestibular training;Discharge planning;Pain management;Self Care/advanced ADL retraining;Therapeutic Activities;UE/LE Coordination activities;Functional mobility training;Disease mangement/prevention;Patient/family education;Therapeutic Exercise;Skin care/wound managment;Community reintegration;DME/adaptive equipment instruction;Psychosocial support;UE/LE Strength taining/ROM;Wheelchair propulsion/positioning OT Basic Self-Care Anticipated Outcome(s): supervision OT Toileting Anticipated Outcome(s): supervision OT Bathroom  Transfers Anticipated Outcome(s): supervision  OT Recommendation Patient destination: Home Follow Up Recommendations: Home health OT Equipment Recommended: 3 in 1 bedside comode Equipment Details: anticipate needing drop arm BSC    Skilled Therapeutic Intervention OT eval completed with discussion on POC, estimated LOS, goals for discharge, and explanation of OT/CIR. OT treatment session completed on ADLs and functional mobility. Pt currently completed bathing ADLs at w/c level at sink, with MinA to stand and steady at sink for washing buttocks region. Pt completed lateral scoot to toilet, with MinA and stand pivot transfer using grab bar from toilet to w/c with MinA. Requires MaxA for toileting including clothing management and toilet hygiene with Pt able to stand at [place] with MinA to complete. Pt requires significantly increased time to complete toileting as Pt with difficulty completing with therapist assist. OT offering to seek assistance from male (vs male) or from different NT/RN however Pt declining, reporting difficulty completing with anyone. Pt education provided on importance of completing task with assistance in order to increase safety with Pt verbalizing understanding and able to follow through with task with encouragement. Pt completed seated grooming ADLs in w/c with setup. Pt education booklet issued on limb loss with education provided on desensitization techniques for phantom limb pain. Pt left sitting up in w/c, mother-in-law having just arrived, call bell and needs within reach.   OT Evaluation Precautions/Restrictions  Precautions Precautions: Fall Restrictions Weight Bearing Restrictions: Yes RLE Weight Bearing: Non weight bearing General Chart Reviewed: Yes   Pain Pain Assessment Pain Assessment: 0-10 Pain Score: 4  Pain Type: Phantom pain;Surgical pain Pain Location: Leg Pain Orientation: Right Pain Descriptors / Indicators: Aching;Grimacing Pain Onset:  On-going Pain Intervention(s): Medication (See eMAR);Repositioned;Emotional support Home Living/Prior Functioning Home Living Family/patient expects to be discharged to:: Private residence Living Arrangements: Spouse/significant other, Children Available Help at Discharge: Family, Available 24 hours/day Type of Home: Mobile home Home Access: Stairs to enter [employer] of Steps: 4 Entrance Stairs-Rails: Right, Left Home Layout: One level Bathroom Shower/Tub: Gaffer, Charity fundraiser: Standard Bathroom Accessibility: Yes  Lives With: Spouse, Son Prior Function Level of Independence: Requires assistive device for independence  Able to Take Stairs?: No Driving: No Vocation: On disability ADL ADL ADL Comments: see functional navigator  Vision Baseline Vision/History: Wears glasses Wears Glasses: At all times Patient Visual Report: No change from baseline Vision Assessment?: No apparent visual deficits Perception  Perception: Within Functional Limits Praxis Praxis: Intact Cognition Overall Cognitive Status: History of cognitive impairments - at baseline Arousal/Alertness: Awake/alert Orientation Level: Place;Person;Situation Person: Oriented Place: Oriented Situation: Oriented Year: 2018 Month: June Day of Week: Correct Memory: Impaired Memory Impairment: Decreased recall of new information;Decreased short term memory Decreased Short Term Memory: Verbal basic;Functional basic Immediate Memory Recall: Sock;Blue;Bed Attention: Selective Selective Attention: Appears intact Awareness: Appears intact Problem Solving: Appears intact (increased time ) Behaviors: Poor frustration tolerance Safety/Judgment: Appears intact Comments: requires increased time to articulate thoughts/needs and to problem solve Sensation Sensation Light Touch: Impaired Detail Light Touch Impaired Details: Impaired RLE Proprioception: Appears Intact Coordination Gross  Motor Movements are Fluid and Coordinated: Yes Fine Motor Movements are Fluid and Coordinated: Yes Motor  Motor Motor: Within Functional Limits Motor - Skilled Clinical Observations: generalized  weakness Mobility  Transfers Transfers: Sit to Stand;Stand to Sit Sit to Stand: 4: Min assist Sit to Stand Details: Verbal cues for technique;Verbal cues for precautions/safety;Verbal cues for sequencing;Manual facilitation for placement Stand to Sit: 4: Min assist Stand to Sit Details (indicate cue type and reason): Verbal cues for sequencing;Verbal cues for technique;Verbal cues for precautions/safety;Manual facilitation for placement  Trunk/Postural Assessment  Cervical Assessment Cervical Assessment: Within Functional Limits Thoracic Assessment Thoracic Assessment: Within Functional Limits Lumbar Assessment Lumbar Assessment: Within Functional Limits Postural Control Postural Control: Deficits on evaluation Righting Reactions: delayed  Balance Balance Balance Assessed: Yes Dynamic Sitting Balance Dynamic Sitting - Balance Support: During functional activity;Feet unsupported Dynamic Sitting - Level of Assistance: 5: Stand by assistance Static Standing Balance Static Standing - Balance Support: During functional activity;Bilateral upper extremity supported Static Standing - Level of Assistance: 4: Min assist Extremity/Trunk Assessment RUE Assessment RUE Assessment: Within Functional Limits LUE Assessment LUE Assessment: Within Functional Limits   See Function Navigator for Current Functional Status.   Refer to Care Plan for Long Term Goals  Recommendations for other services: None    Discharge Criteria: Patient will be discharged from OT if patient refuses treatment 3 consecutive times without medical reason, if treatment goals not met, if there is a change in medical status, if patient makes no progress towards goals or if patient is discharged from hospital.  The above  assessment, treatment plan, treatment alternatives and goals were discussed and mutually agreed upon: by patient  Raymondo Band 12/10/2016, 5:20 PM

## 2016-12-10 NOTE — Progress Notes (Signed)
Initial Nutrition Assessment  DOCUMENTATION CODES:   Not applicable  INTERVENTION:  Continue Nepro Shake po BID, each supplement provides 425 kcal and 19 grams protein.  Encourage adequate PO intake.   NUTRITION DIAGNOSIS:   Increased nutrient needs related to chronic illness as evidenced by estimated needs.  GOAL:   Patient will meet greater than or equal to 90% of their needs  MONITOR:   PO intake, Supplement acceptance, Labs, Weight trends, Skin, I & O's  REASON FOR ASSESSMENT:   Malnutrition Screening Tool    ASSESSMENT:   46 y.o. male with history of diastolic congestive heart failure, tobacco abuse, ESRD, T2DM, PVD s/p revascularization (recent CIR admission 11/20/2016-11/28/2016 for debilitation after right first digit amputation lower extremity).  Admitted 12/04/16 with necrotic changes right tibial muscle and flexion contracture at knee as well as recent fall on incision line and underwent R-AKA by Dr. Sharol Given 12/04/2016.   Pt was unavailable during attempted time of visit. Pt familiar to this RD from recent admission. No recent meal completion recorded. Pt currently has Nepro Shake ordered. RD to continue with current orders. Recent weight loss likely related to fluid status.   Unable to complete Nutrition-Focused physical exam at this time.   Labs and medications reviewed. Phosphorous elevated at 5.7.  Diet Order:  Diet renal with fluid restriction Fluid restriction: 1200 mL Fluid; Room service appropriate? Yes; Fluid consistency: Thin  Skin:  Wound (see comment) (Incision R leg)  Last BM:  6/12  Height:   Ht Readings from Last 1 Encounters:  12/09/16 5\' 9"  (1.753 m)    Weight:   Wt Readings from Last 1 Encounters:  12/10/16 194 lb 7.1 oz (88.2 kg)    Ideal Body Weight:  66.9 kg (adjusted for R AKA)  BMI:  Body mass index is 28.71 kg/m.  Estimated Nutritional Needs:   Kcal:  2200-2400  Protein:  110-120 grams  Fluid:  1.2 L/day  EDUCATION  NEEDS:   No education needs identified at this time  Corrin Parker, MS, RD, LDN Pager # [phone] After hours/ weekend pager # [phone]

## 2016-12-10 NOTE — Progress Notes (Signed)
Nephrology notified of hypertension, 208/104, patient with no symptoms currently.  Telephone order for intervention received.  Will continue to monitor.

## 2016-12-11 ENCOUNTER — Inpatient Hospital Stay (HOSPITAL_COMMUNITY): Payer: 59

## 2016-12-11 ENCOUNTER — Inpatient Hospital Stay (HOSPITAL_COMMUNITY): Payer: Medicare Other | Admitting: Occupational Therapy

## 2016-12-11 DIAGNOSIS — I10 Essential (primary) hypertension: Secondary | ICD-10-CM

## 2016-12-11 DIAGNOSIS — I160 Hypertensive urgency: Secondary | ICD-10-CM

## 2016-12-11 MED ORDER — CLONIDINE HCL 0.2 MG/24HR TD PTWK
0.2000 mg | MEDICATED_PATCH | TRANSDERMAL | Status: DC
  Administered 2016-12-11: 0.2 mg via TRANSDERMAL
  Filled 2016-12-11: qty 1

## 2016-12-11 NOTE — Progress Notes (Signed)
[facility] Progress Note   Subjective: "I am still having pain in my R. Lower foot". Having RLE phantom pain. Up in wheel chair. Seems happy to be in rehab.   Objective Vitals:   12/11/16 0500 12/11/16 0618 12/11/16 0710 12/11/16 0750  BP: (!) 200/79 (!) 203/86 (!) 167/75 (!) 165/72  Pulse: 87 89 89   Resp: 18     Temp: 99.2 F (37.3 C)     TempSrc: Oral     SpO2: 99%     Weight: 86 kg (189 lb 9.5 oz)     Height:       Physical Exam General: Pleasant, cooperative NAD Heart: U4,Q0, 2/6 systolic M.  Lungs: CTAB A/P Abdomen: active BS, non-tender Extremities: R AKA with wound vac in place. Trace Stump edema. No LLE edema.  Dialysis Access: LFA AVF + bruit   Additional Objective Labs: Basic Metabolic Panel:  Recent Labs Lab 12/06/16 0801 12/09/16 0237 12/10/16 1500  NA 134* 135 134*  K 4.1 3.5 3.9  CL 96* 99* 100*  CO2 25 26 22   GLUCOSE 88 106* 105*  BUN 61* 33* 52*  CREATININE 7.46* 6.40* 8.43*  CALCIUM 6.9* 7.0* 7.1*  PHOS 7.6* 5.0* 5.7*   Liver Function Tests:  Recent Labs Lab 12/06/16 0801 12/09/16 0237 12/10/16 1500  ALBUMIN 1.6* 1.6* 1.7*   CBC:  Recent Labs Lab 12/05/16 1121 12/06/16 1118 12/06/16 1632 12/09/16 0237 12/10/16 1500  WBC 11.2* 10.3  --  13.1* 14.9*  HGB 7.1* 6.6* 8.7* 9.1* 8.5*  HCT 22.7* 21.3* 27.1* 29.7* 27.1*  MCV 82.5 83.5  --  85.8 85.0  PLT 537* 498*  --  555* 508*   Blood Culture    Component Value Date/Time   SDES FLUID PERITONEAL DIALYSIS 11/09/2016 0836   SDES FLUID PERITONEAL DIALYSIS 11/09/2016 0836   SPECREQUEST NONE 11/09/2016 0836   SPECREQUEST NONE 11/09/2016 0836   CULT NO GROWTH 5 DAYS 11/09/2016 0836   REPTSTATUS 11/14/2016 FINAL 11/09/2016 0836   REPTSTATUS 11/09/2016 FINAL 11/09/2016 0836    CBG:  Recent Labs Lab 12/05/16 1140 12/05/16 2129 12/06/16 0644 12/06/16 1625 12/06/16 2010  GLUCAP 93 81 95 76 73   Studies/Results: No results found. Medications: .  methocarbamol (ROBAXIN)  IV     . amLODipine  10 mg Oral Daily  . aspirin EC  325 mg Oral Daily  . calcitRIOL  0.5 mcg Oral Daily  . cinacalcet  60 mg Oral QPC supper  . cloNIDine  0.2 mg Transdermal Weekly  . [START ON 12/12/2016] darbepoetin (ARANESP) injection - NON-DIALYSIS  200 mcg Subcutaneous Q Thu-1800  . divalproex  500 mg Oral Q12H  . docusate sodium  100 mg Oral BID  . escitalopram  10 mg Oral Daily  . feeding supplement (NEPRO CARB STEADY)  237 mL Oral BID BM  . ferric citrate  420 mg Oral TID WC  . gabapentin  100 mg Oral TID  . hydrALAZINE  100 mg Oral Q8H  . isosorbide dinitrate  5 mg Oral BID  . labetalol  300 mg Oral BID  . pantoprazole  40 mg Oral Daily  . pravastatin  20 mg Oral q1800     Dialysis:[facility]. Last admit  CCPD: 3L fill 5 exchanges at night with 5th fill manually drained 1-1/2 hours later. Uses mostly 1's and 2's at home  Assess/Plan 1. AMS: due to uremia from improper OP PD. Now back to base line after starting back on HD.CLIP process  initiated. No immediate plans for resuming PD.  2. S/P BKA per Dr. Sharol Given. On Rehab unit now.  2. ESRD -cont TTS HD. HD tomorrow-3.0 K bath, tight heparin 3. Anemia - HGB 8.5 today. Rec'd 2 units PRBCs 12/06/16. ESA ordered. Rec'd 200 mcg IV 12/05/16.  Continue weekly ESA. Follow HGB.  4. Secondary hyperparathyroidism - Cont sensipar, VDRA, binders. Phos 5.7 Ca 7.1 C Ca 8.9 5. HTN/volume - trace R stump edema, no LLE edema. HD yesterday Net UF 3 liters post wt 85.2 kg.  BP high throughout treatment. Attempt 2.5-3 liters tomorrow. Continue lowering EDW. Cont. Amlodipine, hydralazine, clonidine patch and labetalol.  6. 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 Renal diet/renal vit/add prostat.   Rita H. Brown NP-C 12/11/2016, 11:45 AM  [facility] [phone]  Pt seen, examined and agree w A/P as above.  Kelly Splinter MD [employer] pager [phone]   12/11/2016,  12:56 PM

## 2016-12-11 NOTE — Progress Notes (Signed)
[facility] PHYSICAL MEDICINE & REHABILITATION     PROGRESS NOTE  Subjective/Complaints:  Pt seen sitting up in his chair this AM.  Wife at bedside.  Pt slept well overnight.  He notes that he no longer has his clonidine patch on.    ROS: Denies CP, SOB, N/V/D.  Objective: Vital Signs: Blood pressure (!) 165/72, pulse 89, temperature 99.2 F (37.3 C), temperature source Oral, resp. rate 18, height 5\' 9"  (1.753 m), weight 86 kg (189 lb 9.5 oz), SpO2 99 %. No results found.  Recent Labs  12/09/16 0237 12/10/16 1500  WBC 13.1* 14.9*  HGB 9.1* 8.5*  HCT 29.7* 27.1*  PLT 555* 508*    Recent Labs  12/09/16 0237 12/10/16 1500  NA 135 134*  K 3.5 3.9  CL 99* 100*  GLUCOSE 106* 105*  BUN 33* 52*  CREATININE 6.40* 8.43*  CALCIUM 7.0* 7.1*   CBG (last 3)  No results for input(s): GLUCAP in the last 72 hours.  Wt Readings from Last 3 Encounters:  12/11/16 86 kg (189 lb 9.5 oz)  12/08/16 83.4 kg (183 lb 13.8 oz)  12/02/16 86.6 kg (191 lb)    Physical Exam:  BP (!) 165/72 (BP Location: Right Arm)   Pulse 89   Temp 99.2 F (37.3 C) (Oral)   Resp 18   Ht 5\' 9"  (1.753 m)   Wt 86 kg (189 lb 9.5 oz)   SpO2 99%   BMI 28.00 kg/m  Constitutional: He appears well-developed and well-nourished.  HENT: Normocephalic and atraumatic.  Eyes: EOMI. No discharge.  Cardiovascular: RRR.  No JVD. Respiratory: Effort normal and breath sounds normal.  GI: Soft. Bowel sounds are normal.  Musculoskeletal: He exhibits tenderness. He exhibits no edema.  Neurological:  Alert  Follows simple commands. Motor: B/l UE, LLE: 5/5 proximal to distal RLE: HF 4+/5  Sensation intact to light touch  Generalized twitching  Skin. AKA site with wound VAC in place appropriately tender. Multiple tattoos on body  Psych: Atypical affect  Assessment/Plan: 1. 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 Physiatrist is providing close team supervision and 24 hour management of active medical problems listed below. Physiatrist and rehab team continue to assess barriers to discharge/monitor patient progress toward functional and medical goals.  Function:  Bathing Bathing position   Position: Wheelchair/chair at sink  Bathing parts Body parts bathed by patient: Right arm, Left arm, Chest, Abdomen, Front perineal area, Buttocks, Right upper leg, Left upper leg, Left lower leg    Bathing assist Assist Level: Touching or steadying assistance(Pt > 75%)      Upper Body Dressing/Undressing Upper body dressing   What is the patient wearing?: Pull over shirt/dress     Pull over shirt/dress - Perfomed by patient: Thread/unthread right sleeve, Thread/unthread left sleeve, Put head through opening, Pull shirt over trunk          Upper body assist Assist Level: Set up   Set up : To obtain clothing/put away  Lower Body Dressing/Undressing Lower body dressing   What is the patient wearing?: Pants, Shoes     Pants- Performed by patient: Thread/unthread right pants leg, Thread/unthread left pants leg, Pull pants up/down           Shoes - Performed by patient: Don/doff right shoe, Fasten right            Lower body assist  Assist for lower body dressing: Touching or steadying assistance (Pt > 75%)      Toileting Toileting     Toileting steps completed by helper: Adjust clothing prior to toileting, Performs perineal hygiene, Adjust clothing after toileting Toileting Assistive Devices: Grab bar or rail  Toileting assist Assist level: Touching or steadying assistance (Pt.75%)   Transfers Chair/bed transfer   Chair/bed transfer method: Squat pivot Chair/bed transfer assist level: Touching or steadying assistance (Pt > 75%) Chair/bed transfer assistive device: Armrests     Locomotion Ambulation     Max distance: 15 Assist level: Touching  or steadying assistance (Pt > 75%)   Wheelchair   Type: Manual Max wheelchair distance: 150' Assist Level: Supervision or verbal cues  Cognition Comprehension Comprehension assist level: Understands complex 90% of the time/cues 10% of the time  Expression Expression assist level: Expresses basic needs/ideas: With extra time/assistive device  Social Interaction Social Interaction assist level: Interacts appropriately with others with medication or extra time (anti-anxiety, antidepressant).  Problem Solving Problem solving assist level: Solves basic 90% of the time/requires cueing < 10% of the time  Memory Memory assist level: Recognizes or recalls 90% of the time/requires cueing < 10% of the time    Medical Problem List and Plan: 1.  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  DVT Prophylaxis/Anticoagulation: SCD left lower extremity 3. Pain Management: Neurontin 100 mg 3 times a day, Hydrocodone and Robaxin as needed. Monitor mental status  Monitor with increased mobility 4. Mood: Lexapro 10 mg daily, Depakote 500 mg every 12 hours, Ativan 1 mg every 6 hours as needed 5. Neuropsych: This patient is not capable of making decisions on his own behalf. 6. Skin/Wound Care: Routine skin checks 7. Fluids/Electrolytes/Nutrition: Routine I&Os 8. Acute blood loss anemia. Continue Aranesp  Hb 9.1 on 6/11  Cont to monitor 9. ESRD. Hemodialysis as directed 10. Hypertension. Norvasc 10 mg daily, clonidine patch 0.2 mg weekly, hydralazine 100 mg every 8 hours, Isordil 5 mg twice a day, labetalol 300 mg twice a day. Monitor with increased mobility  Hypertensive Urgency overnight, clonidine resumed 11. Diastolic congestive heart failure. Monitor for any signs of fluid overload Filed Weights   12/10/16 1504 12/10/16 1911 12/11/16 0500  Weight: 88.2 kg (194 lb 7.1 oz) 85.2 kg (187 lb 13.3 oz) 86 kg (189 lb 9.5 oz)  12. Hyperlipidemia. Pravachol 20  mg daily 13. Diabetes mellitus with peripheral neuropathy. Latest hemoglobin A1c 5.4. Check blood sugars before meals and at bedtime  Relatively controlled 6/12 14. Tobacco abuse. Counseling  LOS (Days) 2 A FACE TO FACE EVALUATION WAS PERFORMED  Tiffiney Sparrow Lorie Phenix 12/11/2016 9:22 AM

## 2016-12-11 NOTE — Progress Notes (Signed)
Occupational Therapy Session Note  Patient Details  Name: Hayden [name] MRN: [id number] Date of Birth: 1971-06-27  Today's Date: 12/11/2016 OT Individual Time: 1035-1130 OT Individual Time Calculation (min): 55 min    Short Term Goals: Week 1:  OT Short Term Goal 1 (Week 1): STG=LTG 2/2 estimated short LOS  Skilled Therapeutic Interventions/Progress Updates:    Treatment session with focus on coping and d/c planning.  Pt received upright in w/c reporting phantom pain in RLE and not sleeping well over night due to anxiety.  Discussed current feelings regarding AKA as well as discussion of stages of grief, management of phantom pain, and increased independence with mobility.  Completed toilet transfer with lateral scoot onto toilet with supervision and min assist squat pivot back to w/c.  Pt's wife present throughout session and very supportive.  Discussed potential of utilizing mirror therapy to decrease phantom limb pain and overall feelings of self-worth during recovery.  Recommended use of neuropsych as well as peer support group for coping and motivation.  Therapy Documentation Precautions:  Precautions Precautions: Fall Restrictions Weight Bearing Restrictions: Yes RLE Weight Bearing: Non weight bearing General:   Vital Signs: Therapy Vitals Temp: 98.5 F (36.9 C) Temp Source: Oral Pulse Rate: 75 Resp: 16 BP: 140/66 Patient Position (if appropriate): Sitting Oxygen Therapy SpO2: 98 % O2 Device: Not Delivered Pain:  Pt with reports of phantom pain in Rt knee  See Function Navigator for Current Functional Status.   Therapy/Group: Individual Therapy  Simonne Come 12/11/2016, 1:11 PM

## 2016-12-11 NOTE — Progress Notes (Signed)
Unable to locate Clonidine patch upon assessing patient this morning states he was unaware it was not on, Dan A., PA on unit and informed of systolic  b/p reading 654/65. Will f/u this morning on rounds

## 2016-12-11 NOTE — Progress Notes (Signed)
Physical Therapy Session Note  Patient Details  Name: Hayden [name] MRN: [id number] Date of Birth: 01-04-1971  Today's Date: 12/11/2016 PT Individual Time: 0730-0828 PT Individual Time Calculation (min): 58 min   Short Term Goals: Week 1:  PT Short Term Goal 1 (Week 1): STG=LTG  Skilled Therapeutic Interventions/Progress Updates:   Pt with elevated BP earlier this AM but decreasing and within therapeutic range (see below) and cleared by RN for therapies. Pt denies symptoms. Focused on w/c propulsion for UE endurance and strengthening at supervision level with cues for efficiency. Transfer training for squat pivot technique with focus on correct set-up and cues for technique and safety - steady assist overall with poor safety awareness noted. Education on importance of ROM/stretching and issued RLE HEP including hip abduction, extension, flexion and anterior hip stretch in prone. Also instructed in standing hip abduction, flexion and extension for strengthening, ROM and balance. Handout given for exercises. Neuro re-ed for balance retraining in standing with 1 UE support while reaching outside BOS for horseshoe toss with overall min assist - 1 episode of LOB with uncontrolled descent to mat. D/c planning discussed in regards to home entry and pt and wife report ramp will be built in the next couple days.   Therapy Documentation Precautions:  Precautions Precautions: Fall Restrictions Weight Bearing Restrictions: Yes RLE Weight Bearing: Non weight bearing   Vital Signs: BP: (!) 165/72 Patient Position (if appropriate): Sitting Pain: Pain Assessment Pain Assessment: 0-10 Pain Score: 4  Pain Type: Acute pain Patients Stated Pain Goal: 3 Pain Intervention(s): Medication (See eMAR)  Premedicated.    See Function Navigator for Current Functional Status.   Therapy/Group: Individual Therapy  Canary Brim Ivory Broad, PT, DPT  12/11/2016, 9:16 AM

## 2016-12-11 NOTE — Progress Notes (Signed)
Occupational Therapy Session Note  Patient Details  Name: Hayden [name] MRN: [id number] Date of Birth: 08-03-1970  Today's Date: 12/11/2016 OT Individual Time: 1455-1540 OT Individual Time Calculation (min): 45 min   Make-up session  Short Term Goals: Week 1:  OT Short Term Goal 1 (Week 1): STG=LTG 2/2 estimated short LOS  Skilled Therapeutic Interventions/Progress Updates:    Pt completed sit to stand transitions and standing balance with close supervision while engaged in unilateral hand use to play the Wii.  Pt initially able to stand with close supervision with use of the RW and support on the right hand while using controller with the right.  Progressed to using the right hand to hold controller and stabilize on the left hand as well.  Increased pain reported in the right residual limb.  Nursing notified of pain and meds brought during session.  Finished session with transfer back to the room via wheelchair.  Pt left with mother -in-law in room and call button and phone in reach.   Therapy Documentation Precautions:  Precautions Precautions: Fall Restrictions Weight Bearing Restrictions: Yes RLE Weight Bearing: Non weight bearing  Pain: Pain Assessment Pain Assessment: 0-10 Pain Score: 8  Faces Pain Scale: Hurts even more Pain Type: Acute pain Pain Location: Leg Pain Orientation: Right Pain Descriptors / Indicators: Aching;Grimacing;Shooting;Sharp Pain Onset: With Activity Pain Intervention(s): Repositioned;Emotional support ADL: See Function Navigator for Current Functional Status.   Therapy/Group: Individual Therapy  Elonda Giuliano OTR/L 12/11/2016, 4:17 PM

## 2016-12-11 NOTE — Progress Notes (Signed)
Occupational Therapy Session Note  Patient Details  Name: Hayden [name] MRN: [id number] Date of Birth: 02-21-1971  Today's Date: 12/11/2016 OT Individual Time: 1300-1415 OT Individual Time Calculation (min): 75 min    Short Term Goals: Week 1:  OT Short Term Goal 1 (Week 1): STG=LTG 2/2 estimated short LOS  Skilled Therapeutic Interventions/Progress Updates:    Pt received sitting up in w/c in room, ready for OT tx session. Pt reporting increased phantom limb pain with OT educating Pt on desensitization techniques and with Pt demonstrating. Pt self propelled w/c using UEs to rehab apartment, completing tub transfer to tub transfer bench, trialing lateral scoot vs stand pivot transfer with RW and with MinA for each. As Pt has both tub and shower at home for use, educated Pt on safe transfer technique to walk-in shower with plan to practice at a later date as Pt progresses. Pt completed dynamic standing activity in rehab kitchen, opening cabinets and obtaining items, reaching slightly outside BOS during activity completion. Pt completing multiple sit<>stand transfers to RW and stand pivot transfers with RW to various surfaces with supervision and intermittent MinA for safety throughout. Pt demonstrating taking small hops forward, backward, left and right using RW and with supervision. Pt then completed approx 10 min UE exercise on arm bike, rotating forward and backward while seated in w/c. Completed seated bil UE exercises using 1 kg exercise ball, incorporating rotation, bending forward and diagonally outside of BOS and back to midline for 10 reps each exercise. Pt self propelled w/c back to room where Pt was left seated in w/c, call bell and needs within reach.   Therapy Documentation Precautions:  Precautions Precautions: Fall Restrictions Weight Bearing Restrictions: Yes RLE Weight Bearing: Non weight bearing   Pain: Pain Assessment Pain Assessment: 0-10 Pain Score: 8  Pain Type:  Acute pain Pain Location: Leg Pain Orientation: Right Pain Descriptors / Indicators: Aching;Grimacing;Shooting;Sharp Pain Onset: With Activity Pain Intervention(s): Repositioned;Emotional support ADL: ADL ADL Comments: see functional navigator   See Function Navigator for Current Functional Status.   Therapy/Group: Individual Therapy  Raymondo Band 12/11/2016, 3:58 PM

## 2016-12-12 ENCOUNTER — Inpatient Hospital Stay (HOSPITAL_COMMUNITY): Payer: 59 | Admitting: Physical Therapy

## 2016-12-12 ENCOUNTER — Inpatient Hospital Stay (HOSPITAL_COMMUNITY): Payer: Medicare Other | Admitting: Occupational Therapy

## 2016-12-12 DIAGNOSIS — I169 Hypertensive crisis, unspecified: Secondary | ICD-10-CM

## 2016-12-12 DIAGNOSIS — G8918 Other acute postprocedural pain: Secondary | ICD-10-CM

## 2016-12-12 DIAGNOSIS — G546 Phantom limb syndrome with pain: Secondary | ICD-10-CM

## 2016-12-12 DIAGNOSIS — R7303 Prediabetes: Secondary | ICD-10-CM

## 2016-12-12 LAB — RENAL FUNCTION PANEL
Albumin: 1.9 g/dL — ABNORMAL LOW (ref 3.5–5.0)
Anion gap: 8 (ref 5–15)
BUN: 23 mg/dL — ABNORMAL HIGH (ref 6–20)
CO2: 27 mmol/L (ref 22–32)
Calcium: 7.5 mg/dL — ABNORMAL LOW (ref 8.9–10.3)
Chloride: 99 mmol/L — ABNORMAL LOW (ref 101–111)
Creatinine, Ser: 3.89 mg/dL — ABNORMAL HIGH (ref 0.61–1.24)
GFR calc Af Amer: 20 mL/min — ABNORMAL LOW (ref >60)
GFR calc non Af Amer: 17 mL/min — ABNORMAL LOW (ref >60)
Glucose, Bld: 103 mg/dL — ABNORMAL HIGH (ref 65–99)
Phosphorus: 3.2 mg/dL (ref 2.5–4.6)
Potassium: 3.8 mmol/L (ref 3.5–5.1)
Sodium: 134 mmol/L — ABNORMAL LOW (ref 135–145)

## 2016-12-12 LAB — CBC
HCT: 30 % — ABNORMAL LOW (ref 39.0–52.0)
Hemoglobin: 9.3 g/dL — ABNORMAL LOW (ref 13.0–17.0)
MCH: 27 pg (ref 26.0–34.0)
MCHC: 31 g/dL (ref 30.0–36.0)
MCV: 87.2 fL (ref 78.0–100.0)
Platelets: 554 10*3/uL — ABNORMAL HIGH (ref 150–400)
RBC: 3.44 MIL/uL — ABNORMAL LOW (ref 4.22–5.81)
RDW: 18.7 % — ABNORMAL HIGH (ref 11.5–15.5)
WBC: 14.2 K/uL — ABNORMAL HIGH (ref 4.0–10.5)

## 2016-12-12 NOTE — Progress Notes (Signed)
Orthopedic Tech Progress Note Patient Details:  Hayden [name] [birth year] [id number]  Patient ID: [name], male   DOB: 10-06-1970, 46 y.o.   MRN: [id number]   Hildred Priest 12/12/2016, 2:32 PM Called in advanced brace order; spoke with [facility]

## 2016-12-12 NOTE — Progress Notes (Signed)
Social Work Patient ID: Hayden [name], male   DOB: 10-06-1970, 46 y.o.   MRN: [id number]   Have reviewed conference information with pt and wife.  Both aware and agreeable with targeted d/c date on 6/16. Plan to resume HH with [facility].  No concerns.  Shalunda Lindh, LCSW

## 2016-12-12 NOTE — Progress Notes (Signed)
Social Work Patient ID: Hayden [name], male   DOB: 1971-02-15, 46 y.o.   MRN: [id number]  Hayden [name] Social Worker Signed   Patient Care Conference Date of Service: 12/12/2016 11:11 AM      Hide copied text Hover for attribution information Inpatient RehabilitationTeam Conference and Plan of Care Update Date: 12/11/2016   Time: 11:45 AM      Patient Name: Hayden [name]      Medical Record Number: [id number]  Date of Birth: 1971/03/19 Sex: Male         Room/Bed: 4W02C/4W02C-01 Payor Info: Payor: MEDICARE / Plan: MEDICARE PART A AND B / Product Type: *No Product type* /     Admitting Diagnosis: R AKA  esrd  Admit Date/Time:  12/09/2016  2:14 PM Admission Comments: No comment available    Primary Diagnosis:  <principal problem not specified> Principal Problem: <principal problem not specified>       Patient Active Problem List    Diagnosis Date Noted  . Prediabetes    . Phantom limb pain ([place])    . Hypertensive urgency    . Type 2 diabetes mellitus with peripheral neuropathy (HCC)    . 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 Chronic diastolic heart failure ([place])    . Type 2 diabetes mellitus with diabetic peripheral angiopathy without gangrene, without long-term current use of insulin ([place])    . Unilateral AKA, right ([place])    . ESRD on dialysis ([place])    . 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 Neuropathic pain    . Hypertensive crisis    . Labile [name] pressure    . Type 2 diabetes mellitus with diabetic peripheral angiopathy and gangrene, without long-term current use of insulin ([place])    . Debility 11/22/2016  . Anemia of chronic disease    . Diabetes mellitus type 2 in nonobese (HCC)    . Confusion    . S/P femoral-popliteal bypass surgery    . Tobacco abuse    . Benign essential HTN    . History of CVA (cerebrovascular accident)      . Post-operative pain    . Acute respiratory failure with hypoxia ([place])    . Agitation 11/04/2016  . Acute encephalopathy 11/04/2016  . Cellulitis of right lower extremity    . Hypokalemia 11/02/2016  . QT prolongation    . Gangrene of right foot ([place])    . Pressure injury of skin 10/24/2016  . Cellulitis of great toe of right foot 10/23/2016  . Leukocytosis 10/23/2016  . Hyperkalemia 10/23/2016  . Abdominal hematoma 11/15/2015  . HCAP (healthcare-associated pneumonia) 11/15/2015  . Acute [name] loss anemia 11/15/2015  . Absolute anemia    . Right lower quadrant pain    . Pneumonia involving right lung 11/04/2015  . HTN (hypertension) 11/04/2015  . Noninfectious gastroenteritis and colitis 11/04/2015  . Gastroenteritis 11/04/2015  . CVA (cerebral infarction) 06/19/2015  . End stage renal disease on dialysis ([place]) 06/19/2015  . Lacunar infarct, acute ([place]) 06/19/2015  . GERD (gastroesophageal reflux disease) 06/19/2015  . Anxiety and depression 06/19/2015  . Hypertension, well controlled 06/19/2015  . End stage renal disease ([place]) 01/21/2013  . Acute coronary syndrome ([place]) 09/22/2012  . History of partial Right nephrectomy for renal mass (2008) 09/22/2012  . Left ventricular hypertrophy (moderate) per ECHO 2010 09/22/2012  . Chronic systolic congestive  heart failure/EF 40% PER echo 2010 09/22/2012  . 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 Acute kidney injury ([place]) 09/21/2012  . Chest pain 09/21/2012      Expected Discharge Date: Expected Discharge Date: 12/14/16   Team Members Present: Physician leading conference: Dr. 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 with postop delirium  Improve mobility, safety, pain, HTN  See above   Bowel/Bladder     continent of bladderand bowel ,bm 6/12  stay continent all the time  maintain continence level   Swallow/Nutrition/ Hydration               ADL's     min/steady assist bathing and LB dressing, min-mod assist toilet transfer,   supervision/setup  ADL retraining, transfers, pt/family education   Mobility     min A  mod I transfers, supervision gait short distance  transfers, safety awareness, family ed, d/c plan   Communication               Safety/Cognition/ Behavioral Observations             Pain     pain with activities controlled by Oxy ,Gabapentin  less<2  monitor and adress it as need it   Skin     wound vac removed today,daily dray dressing on the top of stump incision(sutures and staples)  daily incision care,no break down   monitor RBKA incision and  daily wound care     Rehab Goals Patient on target to meet rehab goals: Yes *See Care Plan and progress notes for long and short-term goals.   Barriers to Discharge: Pain, HTN, ABLA, DM, mobility, safety     Possible Resolutions to Barriers:  Therapies, optimize BP meds, adjust HTN meds     Discharge Planning/Teaching Needs:  Home with wife who does work swing shift and two adult children. Wife stays here with pt to assist with PD.      Team Discussion:  Wound VAC to be removed tomorrow.  Pain under good control.  Min assist of eval with superivision to mod inde goals.  Anxiety present and will have TR follow up.    Revisions to Treatment Plan:  None    Continued Need for Acute Rehabilitation Level of Care: The patient requires daily medical management by a physician with specialized training in physical medicine and rehabilitation for the following conditions: Daily direction of a multidisciplinary physical rehabilitation program to ensure safe treatment while eliciting the highest outcome that is of  practical value to the patient.: Yes Daily medical management of patient stability for increased activity during participation in an intensive rehabilitation regime.: Yes Daily analysis of laboratory values and/or radiology reports with any subsequent need for medication adjustment of medical intervention for : Post surgical problems;Wound care problems;[name] pressure problems   Hayden [name] 12/12/2016, 1:33 PM

## 2016-12-12 NOTE — Progress Notes (Signed)
[place] PHYSICAL MEDICINE & REHABILITATION     PROGRESS NOTE  Subjective/Complaints:  Pt seen laying in bed this AM.  He slept well overnight.  He is pleased with plans to d/c [facility] today.  ROS: Denies CP, SOB, N/V/D.  Objective: Vital Signs: Blood pressure (!) 175/81, pulse 80, temperature 98.4 F (36.9 C), temperature source Oral, resp. rate 18, height 5\' 9"  (1.753 m), weight 85.5 kg (188 lb 9.6 oz), SpO2 97 %. No results found.  Recent Labs  12/10/16 1500  WBC 14.9*  HGB 8.5*  HCT 27.1*  PLT 508*    Recent Labs  12/10/16 1500  NA 134*  K 3.9  CL 100*  GLUCOSE 105*  BUN 52*  CREATININE 8.43*  CALCIUM 7.1*   CBG (last 3)  No results for input(s): GLUCAP in the last 72 hours.  Wt Readings from Last 3 Encounters:  12/12/16 85.5 kg (188 lb 9.6 oz)  12/08/16 83.4 kg (183 lb 13.8 oz)  12/02/16 86.6 kg (191 lb)    Physical Exam:  BP (!) 175/81   Pulse 80   Temp 98.4 F (36.9 C) (Oral)   Resp 18   Ht 5\' 9"  (1.753 m)   Wt 85.5 kg (188 lb 9.6 oz)   SpO2 97%   BMI 27.85 kg/m  Constitutional: He appears well-developed and well-nourished.  HENT: Normocephalic and atraumatic.  Eyes: EOMI. No discharge.  Cardiovascular: RRR.  No JVD. Respiratory: Effort normal and breath sounds normal.  GI: Soft. Bowel sounds are normal.  Musculoskeletal: He exhibits tenderness. He exhibits no edema.  Neurological:  Alert  Motor: B/l UE, LLE: 5/5 proximal to distal RLE: HF 4+/5  Generalized twitching  Skin. AKA site with wound VAC in place appropriately tender. Multiple tattoos on body  Psych: Atypical affect  Assessment/Plan: 1. 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 Physiatrist is providing close team supervision and 24 hour management of active medical problems listed below. Physiatrist and rehab team continue to assess barriers to  discharge/monitor patient progress toward functional and medical goals.  Function:  Bathing Bathing position   Position: Wheelchair/chair at sink  Bathing parts Body parts bathed by patient: Right arm, Left arm, Chest, Abdomen, Front perineal area, Buttocks, Right upper leg, Left upper leg, Left lower leg    Bathing assist Assist Level: Touching or steadying assistance(Pt > 75%)      Upper Body Dressing/Undressing Upper body dressing   What is the patient wearing?: Pull over shirt/dress     Pull over shirt/dress - Perfomed by patient: Thread/unthread right sleeve, Thread/unthread left sleeve, Put head through opening, Pull shirt over trunk          Upper body assist Assist Level: Set up   Set up : To obtain clothing/put away  Lower Body Dressing/Undressing Lower body dressing   What is the patient wearing?: Pants, Shoes     Pants- Performed by patient: Thread/unthread right pants leg, Thread/unthread left pants leg, Pull pants up/down           Shoes - Performed by patient: Don/doff right shoe, Fasten right            Lower body assist Assist for lower body dressing: Touching or steadying assistance (Pt > 75%)      Toileting Toileting     Toileting steps completed by helper: Adjust clothing prior to toileting, Performs perineal hygiene, Adjust clothing  after toileting Toileting Assistive Devices: Grab bar or rail  Toileting assist Assist level: Touching or steadying assistance (Pt.75%)   Transfers Chair/bed transfer   Chair/bed transfer method: Squat pivot Chair/bed transfer assist level: Touching or steadying assistance (Pt > 75%) Chair/bed transfer assistive device: Armrests     Locomotion Ambulation     Max distance: 15 Assist level: Touching or steadying assistance (Pt > 75%)   Wheelchair   Type: Manual Max wheelchair distance: 150' Assist Level: Supervision or verbal cues  Cognition Comprehension Comprehension assist level: Understands  complex 90% of the time/cues 10% of the time  Expression Expression assist level: Expresses basic needs/ideas: With extra time/assistive device  Social Interaction Social Interaction assist level: Interacts appropriately with others with medication or extra time (anti-anxiety, antidepressant).  Problem Solving Problem solving assist level: Solves basic 90% of the time/requires cueing < 10% of the time  Memory Memory assist level: Recognizes or recalls 90% of the time/requires cueing < 10% of the time    Medical Problem List and Plan: 1.  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  DVT Prophylaxis/Anticoagulation: SCD left lower extremity 3. Pain Management: Neurontin 100 mg 3 times a day, Hydrocodone and Robaxin as needed. Monitor mental status  Monitor with increased mobility, controlled at present 4. Mood: Lexapro 10 mg daily, Depakote 500 mg every 12 hours, Ativan 1 mg every 6 hours as needed 5. Neuropsych: This patient is not capable of making decisions on his own behalf. 6. Skin/Wound Care: Routine skin checks 7. Fluids/Electrolytes/Nutrition: Routine I&Os 8. Acute blood loss anemia. Continue Aranesp  Hb 8.5 on 6/12  Labs with HD  Cont to monitor 9. ESRD. Hemodialysis as directed 10. Hypertension. Norvasc 10 mg daily, clonidine patch 0.2 mg weekly, hydralazine 100 mg every 8 hours, Isordil 5 mg twice a day, labetalol 300 mg twice a day. Monitor with increased mobility  Hypertensive crisis overnight, HD today 11. Diastolic congestive heart failure. Monitor for any signs of fluid overload Filed Weights   12/10/16 1911 12/11/16 0500 12/12/16 0500  Weight: 85.2 kg (187 lb 13.3 oz) 86 kg (189 lb 9.5 oz) 85.5 kg (188 lb 9.6 oz)  12. Hyperlipidemia. Pravachol 20 mg daily 13. Diabetes mellitus with peripheral neuropathy. Latest hemoglobin A1c 5.4. Check blood sugars before meals and at bedtime  Relatively controlled 6/12 14. Tobacco  abuse. Counseling  LOS (Days) 3 A FACE TO FACE EVALUATION WAS PERFORMED  Hayden [name] Hayden [name] 12/12/2016 8:27 AM

## 2016-12-12 NOTE — Progress Notes (Addendum)
Physical Therapy Note  Patient Details  Name: Hayden [name] MRN: [id number] Date of Birth: [birth year] Today's Date: 12/12/2016    Time: [phone]  1:1 Pt c/o 8/10 - 10/10 pain in Rt residual limb during session.  Meds given before session.  Pt educated again on desensitization techniques and pt demos understanding.  Review of supine, standing and sidelying therex with pt performing 2 x 10 hip abd, hip flex, hip ext.  Pt able to perform prone hip extension with some increased pain.  Rt residual limb wrapped for shaping and reducing swelling.  Transfer training with cuing still required for safety with set up and sequencing transfer.  Simulated car transfer with max cuing for safety, set up and sequencing.  Ramp negotiation with w/c with supervision cues for safety.  Pt able to propel w/c throughout unit with mod I in controlled environments.   Time: 1405-1445 40 minutes  1:1 Pt c/o 10/10 pain in residual limb, pain meds given prior to session.  Pt requesting to get back to bed.  Attempted back to bed x 40 minutes. Required increased time and redirection throughout session.  Pt extremely internally distracted often requiring increased cues, which would anger pt.  Pt reports frustrated with his situation and loss of leg, often unable to redirect from cycle. Pt left in w/c with nursing present at end of session.  Zakyah Yanes 12/12/2016, 10:29 AM

## 2016-12-12 NOTE — Patient Care Conference (Signed)
Inpatient RehabilitationTeam Conference and Plan of Care Update Date: 12/11/2016   Time: 11:45 AM    Patient Name: Hayden [name]      Medical Record Number: [id number]  Date of Birth: 10/20/1970 Sex: Male         Room/Bed: 4W02C/4W02C-01 Payor Info: Payor: MEDICARE / Plan: MEDICARE PART A AND B / Product Type: *No Product type* /    Admitting Diagnosis: R AKA  esrd  Admit Date/Time:  12/09/2016  2:14 PM Admission Comments: No comment available   Primary Diagnosis:  <principal problem not specified> Principal Problem: <principal problem not specified>  Patient Active Problem List   Diagnosis Date Noted  . Prediabetes   . Phantom limb pain ([place])   . Hypertensive urgency   . Type 2 diabetes mellitus with peripheral neuropathy (HCC)   . 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 Chronic diastolic heart failure ([place])   . Type 2 diabetes mellitus with diabetic peripheral angiopathy without gangrene, without long-term current use of insulin ([place])   . Unilateral AKA, right ([place])   . ESRD on dialysis ([place])   . 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 Neuropathic pain   . Hypertensive crisis   . Labile blood pressure   . Type 2 diabetes mellitus with diabetic peripheral angiopathy and gangrene, without long-term current use of insulin ([place])   . Debility 11/22/2016  . Anemia of chronic disease   . Diabetes mellitus type 2 in nonobese (HCC)   . Confusion   . S/P femoral-popliteal bypass surgery   . Tobacco abuse   . Benign essential HTN   . History of CVA (cerebrovascular accident)   . Post-operative pain   . Acute respiratory failure with hypoxia ([place])   . Agitation 11/04/2016  . Acute encephalopathy 11/04/2016  . Cellulitis of right lower extremity   . Hypokalemia 11/02/2016  . QT prolongation   . Gangrene of right foot ([place])   . Pressure injury of skin  10/24/2016  . Cellulitis of great toe of right foot 10/23/2016  . Leukocytosis 10/23/2016  . Hyperkalemia 10/23/2016  . Abdominal hematoma 11/15/2015  . HCAP (healthcare-associated pneumonia) 11/15/2015  . Acute blood loss anemia 11/15/2015  . Absolute anemia   . Right lower quadrant pain   . Pneumonia involving right lung 11/04/2015  . HTN (hypertension) 11/04/2015  . Noninfectious gastroenteritis and colitis 11/04/2015  . Gastroenteritis 11/04/2015  . CVA (cerebral infarction) 06/19/2015  . End stage renal disease on dialysis ([place]) 06/19/2015  . Lacunar infarct, acute ([place]) 06/19/2015  . GERD (gastroesophageal reflux disease) 06/19/2015  . Anxiety and depression 06/19/2015  . Hypertension, well controlled 06/19/2015  . End stage renal disease ([place]) 01/21/2013  . Acute coronary syndrome ([place]) 09/22/2012  . History of partial Right nephrectomy for renal mass (2008) 09/22/2012  . Left ventricular hypertrophy (moderate) per ECHO 2010 09/22/2012  . Chronic systolic congestive heart failure/EF 40% PER echo 2010 09/22/2012  . 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 Acute kidney injury ([place]) 09/21/2012  . Chest pain 09/21/2012    Expected Discharge Date: Expected Discharge Date: 12/14/16  Team Members Present: Physician leading conference: Dr. 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 with postop delirium  Improve mobility, safety, pain, HTN  See above   Bowel/Bladder   continent of bladderand bowel ,bm 6/12  stay continent all the time  maintain continence level   Swallow/Nutrition/ Hydration             ADL's   min/steady assist bathing and LB  dressing, min-mod assist toilet transfer,   supervision/setup  ADL retraining, transfers, pt/family education   Mobility   min A  mod I transfers, supervision gait short distance  transfers, safety awareness, family ed, d/c plan   Communication             Safety/Cognition/ Behavioral Observations            Pain   pain with activities controlled by Oxy ,Gabapentin  less<2  monitor and adress it as need it   Skin   wound vac removed today,daily dray dressing on the top of stump incision(sutures and staples)  daily incision care,no break down   monitor RBKA incision and  daily wound care    Rehab Goals Patient on target to meet rehab goals: Yes *See Care Plan and progress notes for long and short-term goals.  Barriers to Discharge: Pain, HTN, ABLA, DM, mobility, safety    Possible Resolutions to Barriers:  Therapies, optimize BP meds, adjust HTN meds    Discharge Planning/Teaching Needs:  Home with wife who does work swing shift and two adult children. Wife stays here with pt to assist with PD.      Team Discussion:  Wound VAC to be removed tomorrow.  Pain under good control.  Min assist of eval with superivision to mod inde goals.  Anxiety present and will have TR follow up.    Revisions to Treatment Plan:  None   Continued Need for Acute Rehabilitation Level of Care: The patient requires daily medical management by a physician with specialized training in physical medicine and rehabilitation for the following conditions: Daily direction of a multidisciplinary physical rehabilitation program to ensure safe treatment while eliciting the highest outcome that is of practical value to the patient.: Yes Daily medical management of patient stability for increased activity during participation in an intensive rehabilitation regime.: Yes Daily analysis of laboratory values and/or radiology reports with any subsequent need for medication adjustment of medical intervention for : Post  surgical problems;Wound care problems;Blood pressure problems  [name] 12/12/2016, 1:33 PM

## 2016-12-12 NOTE — Progress Notes (Signed)
Hayden [name] Progress Note   Subjective: working with Rehab; got upset when told he was going to outpatient hemo when dc'd from here. Said that he'd rather "go to hospice" than do in-center OP HD.   Objective Vitals:   12/11/16 2151 12/12/16 0057 12/12/16 0500 12/12/16 0609  BP: (!) 181/89 (!) 143/58 (!) 175/81 (!) 175/81  Pulse:  75 80   Resp:   18   Temp:   98.4 F (36.9 C)   TempSrc:   Oral   SpO2:   97%   Weight:   85.5 kg (188 lb 9.6 oz)   Height:       Physical Exam General: Pleasant, cooperative NAD Heart: J5,T0, 2/6 systolic M.  Lungs: CTAB A/P Abdomen: active BS, non-tender Extremities: R AKA with wound vac in place; trace Stump edema. No LLE edema.  Dialysis Access: LFA AVF + bruit   Dialysis:[facility]. Last admit  CCPD: 3L fill 5 exchanges at night with 5th fill manually drained 1-1/2 hours later. Uses mostly 1's and 2's at home  Assess:  1. AMS: due to uremia from improper OP PD. Now back to base line after starting back on HD.  CLIP process pending.  Pt upset about prospect of doing in-center OP HD. Have d/w his primary neph Dr Moshe Cipro , she will try to speak w/ pt and his wife about options from here.   2. S/P BKA per Dr. Sharol Given. On Rehab unit.  2. ESRD -cont TTS HD 3. Anemia - HGB 8.5 today. Rec'd 2 units PRBCs 12/06/16. ESA ordered. Rec'd 200 mcg IV 12/05/16.  Continue weekly ESA. Follow HGB.  4. Secondary hyperparathyroidism - Cont sensipar, VDRA, binders. Phos 5.7 Ca 7.1 C Ca 8.9 5. HTN/volume - cont bp meds amlod/ hydral/ clonidine/ labetalol; wt's stable ~ 85kg 6. 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 Renal diet/renal vit/add prostat.   P - as above   Kelly Splinter MD [employer] [name] pager [phone]   12/12/2016, 1:01 PM  Additional Objective Labs: Basic Metabolic Panel:  Recent Labs Lab 12/06/16 0801 12/09/16 0237 12/10/16 1500  NA 134* 135 134*  K 4.1 3.5 3.9  CL 96* 99* 100*  CO2 25  26 22   GLUCOSE 88 106* 105*  BUN 61* 33* 52*  CREATININE 7.46* 6.40* 8.43*  CALCIUM 6.9* 7.0* 7.1*  PHOS 7.6* 5.0* 5.7*   Liver Function Tests:  Recent Labs Lab 12/06/16 0801 12/09/16 0237 12/10/16 1500  ALBUMIN 1.6* 1.6* 1.7*   CBC:  Recent Labs Lab 12/06/16 1118 12/06/16 1632 12/09/16 0237 12/10/16 1500  WBC 10.3  --  13.1* 14.9*  HGB 6.6* 8.7* 9.1* 8.5*  HCT 21.3* 27.1* 29.7* 27.1*  MCV 83.5  --  85.8 85.0  PLT 498*  --  555* 508*   Blood Culture    Component Value Date/Time   SDES FLUID PERITONEAL DIALYSIS 11/09/2016 0836   SDES FLUID PERITONEAL DIALYSIS 11/09/2016 0836   SPECREQUEST NONE 11/09/2016 0836   SPECREQUEST NONE 11/09/2016 0836   CULT NO GROWTH 5 DAYS 11/09/2016 0836   REPTSTATUS 11/14/2016 FINAL 11/09/2016 0836   REPTSTATUS 11/09/2016 FINAL 11/09/2016 0836    CBG:  Recent Labs Lab 12/05/16 2129 12/06/16 0644 12/06/16 1625 12/06/16 2010  GLUCAP 81 95 76 73   Studies/Results: No results found. Medications: . methocarbamol (ROBAXIN)  IV     . amLODipine  10 mg Oral Daily  . aspirin EC  325 mg Oral Daily  . calcitRIOL  0.5  mcg Oral Daily  . cinacalcet  60 mg Oral QPC supper  . cloNIDine  0.2 mg Transdermal Weekly  . darbepoetin (ARANESP) injection - NON-DIALYSIS  200 mcg Subcutaneous Q Thu-1800  . divalproex  500 mg Oral Q12H  . docusate sodium  100 mg Oral BID  . escitalopram  10 mg Oral Daily  . feeding supplement (NEPRO CARB STEADY)  237 mL Oral BID BM  . ferric citrate  420 mg Oral TID WC  . gabapentin  100 mg Oral TID  . hydrALAZINE  100 mg Oral Q8H  . isosorbide dinitrate  5 mg Oral BID  . labetalol  300 mg Oral BID  . pantoprazole  40 mg Oral Daily  . pravastatin  20 mg Oral q1800

## 2016-12-12 NOTE — Progress Notes (Signed)
Occupational Therapy Session Note  Patient Details  Name: Hayden [name] MRN: [id number] Date of Birth: 13-Dec-1970  Today's Date: 12/12/2016 OT Individual Time: 8828-0034 OT Individual Time Calculation (min): 60 min    Short Term Goals: Week 1:  OT Short Term Goal 1 (Week 1): STG=LTG 2/2 estimated short LOS  Skilled Therapeutic Interventions/Progress Updates:    Treatment session with focus on functional mobility and activity tolerance.  Pt received in bed, wife reporting he just woke up.  Required increased time and redirection to motivate pt to get OOB.  Pt extremely internally distracted often requiring increased cues, which would anger pt.  Pt reports frustrated with his situation and loss of leg, often unable to redirect from cycle.  Supervision bed mobility and squat/lateral scoot transfer to w/c.  Cues for hand placement and technique to increase weight shift.  Pt propelled w/c throughout unit, over carpet and tile, at Mod I level.    Therapy Documentation Precautions:  Precautions Precautions: Fall Restrictions Weight Bearing Restrictions: Yes RLE Weight Bearing: Non weight bearing Pain: Pain Assessment Pain Assessment: 0-10 Pain Score: 2  Pain Type: Surgical pain Pain Location: Leg Pain Orientation: Right Pain Descriptors / Indicators: Aching Pain Frequency: Intermittent Pain Onset: Gradual Patients Stated Pain Goal: 2 Pain Intervention(s): Repositioned  See Function Navigator for Current Functional Status.   Therapy/Group: Individual Therapy  Simonne Come 12/12/2016, 12:11 PM

## 2016-12-12 NOTE — Progress Notes (Signed)
Physical Therapy Session Note  Patient Details  Name: YEUDIEL MATEO MRN: [id number] Date of Birth: [birth year]  Today's Date: 12/12/2016 PT Individual Time: 1130-1210 PT Individual Time Calculation (min): 40 min   Short Term Goals: Week 1:  PT Short Term Goal 1 (Week 1): STG=LTG  Skilled Therapeutic Interventions/Progress Updates:  Pt presented in w/c with visitors present. Pt agreeable to attempt gait with RW. Pt attempted x 3 from w/c however only able to clear buttock from w/c and limited by pain. Pt agreeable to attempt standing from parallel bars. Pt propelled self to rehab gym mod I and attempted x 2 from parallel bars. Pt continued to be limited by pain from residual limb. Pt expressed frustration and PTA provided emotional support. Pt remained in w/c at end of session with SO present.      Therapy Documentation Precautions:  Precautions Precautions: Fall Restrictions Weight Bearing Restrictions: Yes RLE Weight Bearing: Non weight bearing General:   Vital Signs:  Pain: Pain Assessment Pain Assessment: 0-10 Pain Score: 2  Pain Type: Surgical pain Pain Location: Leg Pain Orientation: Right Pain Descriptors / Indicators: Aching Pain Frequency: Intermittent Pain Onset: Gradual Patients Stated Pain Goal: 2 Pain Intervention(s): Repositioned   See Function Navigator for Current Functional Status.   Therapy/Group: Individual Therapy  Bach Rocchi  Yee Joss, PTA  12/12/2016, 12:14 PM

## 2016-12-12 NOTE — Progress Notes (Signed)
Social Work  Social Work Assessment and Plan  Patient Details  Name: Hayden [name] MRN: [id number] Date of Birth: May 22, 1971  Today's Date: 12/12/2016  Problem List:  Patient Active Problem List   Diagnosis Date Noted  . Prediabetes   . Phantom limb pain ([place])   . Hypertensive urgency   . Type 2 diabetes mellitus with peripheral neuropathy (HCC)   . Amputee, above knee, right ([place]) 12/09/2016  . Chronic diastolic heart failure ([place])   . Type 2 diabetes mellitus with diabetic peripheral angiopathy without gangrene, without long-term current use of insulin ([place])   . Unilateral AKA, right ([place])   . ESRD on dialysis ([place])   . 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 Atherosclerosis of native arteries of extremities with gangrene, right leg ([place]) 12/02/2016  . Neuropathic pain   . Hypertensive crisis   . Labile blood pressure   . Type 2 diabetes mellitus with diabetic peripheral angiopathy and gangrene, without long-term current use of insulin ([place])   . Debility 11/22/2016  . Anemia of chronic disease   . Diabetes mellitus type 2 in nonobese (HCC)   . Confusion   . S/P femoral-popliteal bypass surgery   . Tobacco abuse   . Benign essential HTN   . History of CVA (cerebrovascular accident)   . Post-operative pain   . Acute respiratory failure with hypoxia ([place])   . Agitation 11/04/2016  . Acute encephalopathy 11/04/2016  . Cellulitis of right lower extremity   . Hypokalemia 11/02/2016  . QT prolongation   . Gangrene of right foot ([place])   . Pressure injury of skin 10/24/2016  . Cellulitis of great toe of right foot 10/23/2016  . Leukocytosis 10/23/2016  . Hyperkalemia 10/23/2016  . Abdominal hematoma 11/15/2015  . HCAP (healthcare-associated pneumonia) 11/15/2015  . Acute blood loss anemia 11/15/2015  . Absolute anemia   . Right lower quadrant pain   . Pneumonia involving right lung 11/04/2015  . HTN (hypertension) 11/04/2015  . Noninfectious  gastroenteritis and colitis 11/04/2015  . Gastroenteritis 11/04/2015  . CVA (cerebral infarction) 06/19/2015  . End stage renal disease on dialysis ([place]) 06/19/2015  . Lacunar infarct, acute ([place]) 06/19/2015  . GERD (gastroesophageal reflux disease) 06/19/2015  . Anxiety and depression 06/19/2015  . Hypertension, well controlled 06/19/2015  . End stage renal disease ([place]) 01/21/2013  . Acute coronary syndrome ([place]) 09/22/2012  . History of partial Right nephrectomy for renal mass (2008) 09/22/2012  . Left ventricular hypertrophy (moderate) per ECHO 2010 09/22/2012  . Chronic systolic congestive heart failure/EF 40% PER echo 2010 09/22/2012  . 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 Acute kidney injury ([place]) 09/21/2012  . Chest pain 09/21/2012   Past Medical History:  Past Medical History:  Diagnosis Date  . Anemia March 2014  . Cancer ([place])    Kidney  . CHF (congestive heart failure) ([place]) 3818   Acute systolic and diastolic CHF  . Depression    & rage --  was in counseling....great now  . Diabetes mellitus    NO DM SINCE LOST 130LBS  . ESRD on peritoneal dialysis ([place]) 2018   Started in-center HD approx 2015 for 2 years, then did about 1 year of home HD and then started peritoneal dialysis in early 2018.    . Gangrene ([place])    right leg and foot  . History of kidney cancer   . Hyperlipidemia   . Hypertension   . Obesity   . PONV (postoperative  nausea and vomiting)   . Stroke [facility])    Past Surgical History:  Past Surgical History:  Procedure Laterality Date  . ABDOMINAL AORTOGRAM W/LOWER EXTREMITY N/A 10/28/2016   Procedure: Abdominal Aortogram w/Lower Extremity;  Surgeon: Angelia Mould, MD;  Location: [facility];  Service: Cardiovascular;  Laterality: N/A;  . AMPUTATION Right 11/05/2016   Procedure: AMPUTATION RIGHT FIRST RAY;  Surgeon: Conrad [place], MD;  Location: [facility];  Service: Vascular;  Laterality: Right;  . AMPUTATION Right 12/04/2016    Procedure: RIGHT ABOVE KNEE AMPUTATION;  Surgeon: Newt Minion, MD;  Location: [facility];  Service: Orthopedics;  Laterality: Right;  . AV FISTULA PLACEMENT Left 01/26/2013   Procedure: ARTERIOVENOUS (AV) FISTULA CREATION - LEFT RADIAL CEPHALIC AVF;  Surgeon: Angelia Mould, MD;  Location: [facility];  Service: Vascular;  Laterality: Left;  . CHOLECYSTECTOMY  11/06/2015   Procedure: LAPAROSCOPIC CHOLECYSTECTOMY;  Surgeon: Ralene Ok, MD;  Location: [facility];  Service: General;;  . FEMORAL-POPLITEAL BYPASS GRAFT Right 11/05/2016   Procedure: BYPASS GRAFT FEMORAL-POPLITEAL ARTERY USING NON-REVERSED RIGHT GREATER SAPPHENOUS VEIN;  Surgeon: Conrad [place], MD;  Location: [facility];  Service: Vascular;  Laterality: Right;  . HERNIA REPAIR    . LOWER EXTREMITY ANGIOGRAM Right 10/30/2016   Procedure: Right  LOWER EXTREMITY ANGIOGRAM WITH RIGHT SUPERFICIAL FEMORAL ARTERY balloon angioplasty;  Surgeon: Conrad Claxton, MD;  Location: [facility];  Service: Vascular;  Laterality: Right;  . NEPHRECTOMY Right 2008   partial  . TESTICLE TORSION REDUCTION    . TONSILLECTOMY AND ADENOIDECTOMY    . VEIN HARVEST Right 11/05/2016   Procedure: RIGHT GREATER SAPPHENOUS VEIN HARVEST;  Surgeon: Conrad [place], MD;  Location: [facility];  Service: Vascular;  Laterality: Right;   Social History:  reports that he has been smoking Cigarettes.  He has a 2.50 pack-year smoking history. He has never used smokeless tobacco. He reports that he uses drugs, including Marijuana. He reports that he does not drink alcohol.  Family / Support Systems Marital Status: Married Patient Roles: Spouse, Parent Spouse/Significant Other: wife, Joelene Millin @ (C) [phone] Children: son, Grayland Ormond @ [phone] Other Supports: Friends and extended family Anticipated Caregiver: Maudie Mercury and children Ability/Limitations of Caregiver: wife works 20st and 2nd shifts; 97 year old son out of school for the summer Caregiver Availability: 24/7 Family Dynamics: Wife and  children very supportive and planning to resume 24/7 support.  Many local friends who are also supportive.  Social History Preferred language: English Religion: Christian Cultural Background: NA Education: HS Read: Yes Write: Yes Employment Status: Disabled Date Retired/Disabled/Unemployed: None Freight forwarder Issues: None Guardian/Conservator: None-according to MD pt is not fully capable of making his decisions at this time. Will look toward his wife to make any decisions for him while here   Abuse/Neglect Physical Abuse: Denies Verbal Abuse: Denies Sexual Abuse: Denies Exploitation of patient/patient's resources: Denies Self-Neglect: Denies  Emotional Status Pt's affect, behavior adn adjustment status: Pt is motivated and wanting to get home ASAP. His wife has been staying with him and working in between. Pt wants to get back to being independent and able to take care of himself.   He expresses some concern that he may continue to medical issues and may need further surgeries.  Admits to some anxiety.  TR to follow up since expected to d/c prior to being seen by neuropsych. Recent Psychosocial Issues: other health issues was managing until this Pyschiatric History: No history seems to be coping appropriately  and talking about his concerns.  Substance Abuse History: Marijuana and tobacco aware of MD recommendations.    Patient / Family Perceptions, Expectations & Goals Pt/Family understanding of illness & functional limitations: Pt and wife can explain his surgery and issues. Wife is here and talks with the MD about pt's healing and treatment plan. Both feel their questions and concerns have been addressed while here. Premorbid pt/family roles/activities: Husband, Father, Son, retiree, Dialysis pt, freind, etc Anticipated changes in roles/activities/participation: resume Pt/family expectations/goals: Pt states he is eager to d/c home and wants to be as independent as  possible  [employer]: Other (Comment) (PD pt) Premorbid Home Care/DME Agencies: Other (Comment) ([facility] for [facility] after first CIR d/c) Transportation available at discharge: Wife and children Resource referrals recommended: Support group (specify)  Discharge Planning Living Arrangements: Spouse/significant other, Children Support Systems: Spouse/significant other, Other relatives Type of Residence: Private residence Insurance Resources: [employer], Multimedia programmer (specify) Financial Resources: SSD, Family Support Financial Screen Referred: No Living Expenses: Own Money Management: Spouse Does the patient have any problems obtaining your medications?: No Home Management: Wife does most and pt assists with some when he feels up to it Patient/Family Preliminary Plans: Return home with wife and children who can assist up to 23 hr per day. All work but between their schedules they can almost be there 24 hr. Pt wants to be mod/i before going home. Will await team evaluations and work on discharge needs. Social Work Anticipated Follow Up Needs: HH/OP Expected length of stay: 7 days  Clinical Impression Unfortunate gentleman who returns to CIR a 2nd time within a month and now with AKA.  Family very supportive and have been providing 24/7 support since first d/c.  Pt admits some anxiety and trying to work through it.  Eager to get home ASAP and team recommends short LOS.  Will restart [facility] services but has most needed DME.    Larone Kliethermes 12/12/2016, 11:40 AM

## 2016-12-12 NOTE — Care Management Note (Signed)
[facility] Individual Statement of Services  Patient Name:  Hayden [name]  Date:  12/12/2016  Welcome to the [facility].  Our goal is to provide you with an individualized program based on your diagnosis and situation, designed to meet your specific needs.  With this comprehensive rehabilitation program, you will be expected to participate in at least 3 hours of rehabilitation therapies Monday-Friday, with modified therapy programming on the weekends.  Your rehabilitation program will include the following services:  Physical Therapy (PT), Occupational Therapy (OT), 24 hour per day rehabilitation nursing, Therapeutic Recreaction (TR), Case Management (Social Worker), Rehabilitation Medicine, Nutrition Services and Pharmacy Services  Weekly team conferences will be held on Wednesdays to discuss your progress.  Your Social Worker will talk with you frequently to get your input and to update you on team discussions.  Team conferences with you and your family in attendance may also be held.  Expected length of stay: 7 days  Overall anticipated outcome: supervision  Depending on your progress and recovery, your program may change. Your Social Worker will coordinate services and will keep you informed of any changes. Your Social Worker's name and contact numbers are listed  below.  The following services may also be recommended but are not provided by the [facility] will be made to provide these services after discharge if needed.  Arrangements include referral to agencies that provide these services.  Your insurance has been verified to be:  Medicare and [place] Your primary doctor is:  [place]  Pertinent information will be shared with your doctor and your insurance company.  Social  Worker:  [place], Melissa or ([phone]   Information discussed with and copy given to patient by: Lennart Pall, 12/12/2016, 11:42 AM

## 2016-12-13 ENCOUNTER — Inpatient Hospital Stay (HOSPITAL_COMMUNITY): Payer: Medicare Other | Admitting: Occupational Therapy

## 2016-12-13 ENCOUNTER — Inpatient Hospital Stay (HOSPITAL_COMMUNITY): Payer: 59 | Admitting: Physical Therapy

## 2016-12-13 LAB — RENAL FUNCTION PANEL
Albumin: 1.9 g/dL — ABNORMAL LOW (ref 3.5–5.0)
Anion gap: 11 (ref 5–15)
BUN: 42 mg/dL — ABNORMAL HIGH (ref 6–20)
CO2: 24 mmol/L (ref 22–32)
Calcium: 7.5 mg/dL — ABNORMAL LOW (ref 8.9–10.3)
Chloride: 102 mmol/L (ref 101–111)
Creatinine, Ser: 5.36 mg/dL — ABNORMAL HIGH (ref 0.61–1.24)
GFR calc Af Amer: 14 mL/min — ABNORMAL LOW (ref >60)
GFR calc non Af Amer: 12 mL/min — ABNORMAL LOW (ref >60)
Glucose, Bld: 114 mg/dL — ABNORMAL HIGH (ref 65–99)
Phosphorus: 5.1 mg/dL — ABNORMAL HIGH (ref 2.5–4.6)
Potassium: 3.9 mmol/L (ref 3.5–5.1)
Sodium: 137 mmol/L (ref 135–145)

## 2016-12-13 LAB — CBC
HCT: 27.6 % — ABNORMAL LOW (ref 39.0–52.0)
Hemoglobin: 8.4 g/dL — ABNORMAL LOW (ref 13.0–17.0)
MCH: 26.3 pg (ref 26.0–34.0)
MCHC: 30.4 g/dL (ref 30.0–36.0)
MCV: 86.5 fL (ref 78.0–100.0)
Platelets: 577 10*3/uL — ABNORMAL HIGH (ref 150–400)
RBC: 3.19 MIL/uL — ABNORMAL LOW (ref 4.22–5.81)
RDW: 18.2 % — ABNORMAL HIGH (ref 11.5–15.5)
WBC: 16.5 K/uL — ABNORMAL HIGH (ref 4.0–10.5)

## 2016-12-13 MED ORDER — ISOSORBIDE DINITRATE 5 MG PO TABS: 5.0000 mg | ORAL_TABLET | Freq: Two times a day (BID) | ORAL | 0 refills | Status: DC

## 2016-12-13 MED ORDER — ALTEPLASE 2 MG IJ SOLR: 2.0000 mg | Freq: Once | INTRAMUSCULAR | Status: DC | PRN

## 2016-12-13 MED ORDER — HEPARIN SODIUM (PORCINE) 1000 UNIT/ML DIALYSIS: 2000.0000 [IU] | Freq: Once | INTRAMUSCULAR | Status: DC

## 2016-12-13 MED ORDER — DARBEPOETIN ALFA 200 MCG/0.4ML IJ SOSY
200.0000 ug | PREFILLED_SYRINGE | Freq: Once | INTRAMUSCULAR | Status: DC
  Filled 2016-12-13: qty 0.4

## 2016-12-13 MED ORDER — ASPIRIN 325 MG PO TBEC: 325.0000 mg | DELAYED_RELEASE_TABLET | Freq: Every day | ORAL | 0 refills | Status: DC

## 2016-12-13 MED ORDER — AMLODIPINE BESYLATE 10 MG PO TABS: 10.0000 mg | ORAL_TABLET | Freq: Every day | ORAL | 0 refills | Status: DC

## 2016-12-13 MED ORDER — SODIUM CHLORIDE 0.9 % IV SOLN: 100.0000 mL | INTRAVENOUS | Status: DC | PRN

## 2016-12-13 MED ORDER — ESCITALOPRAM OXALATE 10 MG PO TABS: 10.0000 mg | ORAL_TABLET | Freq: Every day | ORAL | 0 refills | Status: DC

## 2016-12-13 MED ORDER — CINACALCET HCL 60 MG PO TABS: 60.0000 mg | ORAL_TABLET | Freq: Every day | ORAL | 0 refills | Status: DC

## 2016-12-13 MED ORDER — DIVALPROEX SODIUM 125 MG PO CSDR: 500.0000 mg | DELAYED_RELEASE_CAPSULE | Freq: Two times a day (BID) | ORAL | 0 refills | Status: DC

## 2016-12-13 MED ORDER — LIDOCAINE-PRILOCAINE 2.5-2.5 % EX CREA
1.0000 "application " | TOPICAL_CREAM | CUTANEOUS | Status: DC | PRN
  Filled 2016-12-13: qty 5

## 2016-12-13 MED ORDER — LIDOCAINE-PRILOCAINE 2.5-2.5 % EX CREA: 1.0000 "application " | TOPICAL_CREAM | CUTANEOUS | Status: DC | PRN

## 2016-12-13 MED ORDER — HYDRALAZINE HCL 100 MG PO TABS: 100.0000 mg | ORAL_TABLET | Freq: Three times a day (TID) | ORAL | 0 refills | Status: DC

## 2016-12-13 MED ORDER — DARBEPOETIN ALFA 200 MCG/0.4ML IJ SOSY
PREFILLED_SYRINGE | INTRAMUSCULAR | Status: AC
  Filled 2016-12-13: qty 0.4

## 2016-12-13 MED ORDER — HEPARIN SODIUM (PORCINE) 1000 UNIT/ML DIALYSIS
20.0000 [IU]/kg | INTRAMUSCULAR | Status: DC | PRN
  Filled 2016-12-13: qty 2

## 2016-12-13 MED ORDER — PENTAFLUOROPROP-TETRAFLUOROETH EX AERO: 1.0000 "application " | INHALATION_SPRAY | CUTANEOUS | Status: DC | PRN

## 2016-12-13 MED ORDER — LABETALOL HCL 300 MG PO TABS: 300.0000 mg | ORAL_TABLET | Freq: Two times a day (BID) | ORAL | 0 refills | Status: DC

## 2016-12-13 MED ORDER — CALCITRIOL 0.5 MCG PO CAPS
ORAL_CAPSULE | ORAL | Status: AC
  Filled 2016-12-13: qty 1

## 2016-12-13 MED ORDER — SODIUM CHLORIDE 0.9 % IV SOLN
250.0000 mg | Freq: Once | INTRAVENOUS | Status: AC
  Administered 2016-12-13: 250 mg via INTRAVENOUS
  Filled 2016-12-13: qty 20

## 2016-12-13 MED ORDER — RENA-VITE PO TABS: 1.0000 | ORAL_TABLET | Freq: Every day | ORAL | 0 refills | Status: AC

## 2016-12-13 MED ORDER — PRAVASTATIN SODIUM 20 MG PO TABS: 20.0000 mg | ORAL_TABLET | Freq: Every day | ORAL | 0 refills | Status: DC

## 2016-12-13 MED ORDER — LIDOCAINE HCL (PF) 1 % IJ SOLN
5.0000 mL | INTRAMUSCULAR | Status: DC | PRN
Start: 2016-12-13 — End: 2016-12-13

## 2016-12-13 MED ORDER — LIDOCAINE HCL (PF) 1 % IJ SOLN
5.0000 mL | INTRAMUSCULAR | Status: DC | PRN
  Filled 2016-12-13: qty 5

## 2016-12-13 MED ORDER — LORAZEPAM 1 MG PO TABS: 1.0000 mg | ORAL_TABLET | Freq: Four times a day (QID) | ORAL | 0 refills | Status: DC | PRN

## 2016-12-13 MED ORDER — DARBEPOETIN ALFA 200 MCG/0.4ML IJ SOSY
200.0000 ug | PREFILLED_SYRINGE | INTRAMUSCULAR | Status: DC
  Administered 2016-12-13: 200 ug via INTRAVENOUS
  Filled 2016-12-13: qty 0.4

## 2016-12-13 MED ORDER — HEPARIN SODIUM (PORCINE) 1000 UNIT/ML DIALYSIS
1000.0000 [IU] | INTRAMUSCULAR | Status: DC | PRN
  Filled 2016-12-13: qty 1

## 2016-12-13 MED ORDER — OMEPRAZOLE 40 MG PO CPDR: 40.0000 mg | DELAYED_RELEASE_CAPSULE | Freq: Every day | ORAL | 0 refills | Status: AC

## 2016-12-13 MED ORDER — HYDROCODONE-ACETAMINOPHEN 10-325 MG PO TABS: 1.0000 | ORAL_TABLET | ORAL | 0 refills | Status: DC | PRN

## 2016-12-13 MED ORDER — CALCITRIOL 0.5 MCG PO CAPS: 0.5000 ug | ORAL_CAPSULE | Freq: Every day | ORAL | 0 refills | Status: DC

## 2016-12-13 MED ORDER — GABAPENTIN 100 MG PO CAPS: 100.0000 mg | ORAL_CAPSULE | Freq: Three times a day (TID) | ORAL | 0 refills | Status: DC

## 2016-12-13 MED ORDER — DARBEPOETIN ALFA 200 MCG/0.4ML IJ SOSY: 200.0000 ug | PREFILLED_SYRINGE | INTRAMUSCULAR | Status: DC

## 2016-12-13 MED ORDER — CLONIDINE HCL 0.2 MG/24HR TD PTWK: 0.2000 mg | MEDICATED_PATCH | TRANSDERMAL | 12 refills | Status: DC

## 2016-12-13 MED ORDER — FERRIC CITRATE 1 GM 210 MG(FE) PO TABS: 420.0000 mg | ORAL_TABLET | Freq: Three times a day (TID) | ORAL | 0 refills | Status: AC

## 2016-12-13 MED ORDER — METHOCARBAMOL 500 MG PO TABS: 500.0000 mg | ORAL_TABLET | Freq: Four times a day (QID) | ORAL | 0 refills | Status: DC | PRN

## 2016-12-13 MED ORDER — HEPARIN SODIUM (PORCINE) 1000 UNIT/ML DIALYSIS: 1000.0000 [IU] | INTRAMUSCULAR | Status: DC | PRN

## 2016-12-13 NOTE — Progress Notes (Signed)
[facility] Progress Note   Subjective: CLIP's to [facility] on MWF schedule, to start next Monday  Objective Vitals:   12/12/16 2141 12/12/16 2147 12/12/16 2234 12/13/16 0500  BP: (!) 171/71   (!) 172/67  Pulse:  75 92 84  Resp:   18 18  Temp:   98.2 F (36.8 C) 98.7 F (37.1 C)  TempSrc:   Oral Oral  SpO2:   97% 96%  Weight:    83.6 kg (184 lb 4.9 oz)  Height:       Physical Exam General: Pleasant, cooperative NAD Heart: J5,K0, 2/6 systolic M.  Lungs: CTAB A/P Abdomen: active BS, non-tender Extremities: R AKA with wound vac in place; trace Stump edema. No LLE edema.  Dialysis Access: LFA AVF + bruit   Dialysis: was on CCPD, now on HD.  While on home HD was getting 2000 u heparin each HD   Assess:  1. AMS: due to uremia from improper OP PD. Now back to base line after starting back on HD.  Accepted at [facility] on MWF schedule.  Will plan HD today and he is supposed to dc home over the w/e.  2. S/P BKA per Dr. Sharol Given. On Rehab unit.  2. ESRD -cont TTS HD 3. Anemia - HGB 8.5 today. Rec'd 2 units PRBCs 12/06/16. ESA ordered. Rec'd 200 mcg IV 12/05/16.  Continue weekly ESA. Follow HGB.  4. Secondary hyperparathyroidism - Cont sensipar, VDRA, binders. Phos 5.7 Ca 7.1 C Ca 8.9 5. HTN/volume - cont amlod/ hydral/ clonidine/ labetalol; wt's stable ~ 85kg 6. 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 Renal diet/renal vit/add prostat.   P - as above   Kelly Splinter MD [employer] pager [phone]   12/12/2016, 1:01 PM  Additional Objective Labs: Basic Metabolic Panel:  Recent Labs Lab 12/09/16 0237 12/10/16 1500 12/12/16 2252  NA 135 134* 134*  K 3.5 3.9 3.8  CL 99* 100* 99*  CO2 26 22 27   GLUCOSE 106* 105* 103*  BUN 33* 52* 23*  CREATININE 6.40* 8.43* 3.89*  CALCIUM 7.0* 7.1* 7.5*  PHOS 5.0* 5.7* 3.2   Liver Function Tests:  Recent Labs Lab 12/09/16 0237 12/10/16 1500 12/12/16 2252  ALBUMIN 1.6* 1.7* 1.9*    CBC:  Recent Labs Lab 12/06/16 1118  12/09/16 0237 12/10/16 1500 12/12/16 2252  WBC 10.3  --  13.1* 14.9* 14.2*  HGB 6.6*  < > 9.1* 8.5* 9.3*  HCT 21.3*  < > 29.7* 27.1* 30.0*  MCV 83.5  --  85.8 85.0 87.2  PLT 498*  --  555* 508* 554*  < > = values in this interval not displayed. Blood Culture    Component Value Date/Time   SDES FLUID PERITONEAL DIALYSIS 11/09/2016 0836   SDES FLUID PERITONEAL DIALYSIS 11/09/2016 0836   SPECREQUEST NONE 11/09/2016 0836   SPECREQUEST NONE 11/09/2016 0836   CULT NO GROWTH 5 DAYS 11/09/2016 0836   REPTSTATUS 11/14/2016 FINAL 11/09/2016 0836   REPTSTATUS 11/09/2016 FINAL 11/09/2016 0836    CBG:  Recent Labs Lab 12/06/16 1625 12/06/16 2010  GLUCAP 76 73   Studies/Results: No results found. Medications: . methocarbamol (ROBAXIN)  IV     . amLODipine  10 mg Oral Daily  . aspirin EC  325 mg Oral Daily  . calcitRIOL  0.5 mcg Oral Daily  . cinacalcet  60 mg Oral QPC supper  . cloNIDine  0.2 mg Transdermal Weekly  . darbepoetin (ARANESP) injection - NON-DIALYSIS  200 mcg  Subcutaneous Once  . [START ON 12/19/2016] darbepoetin (ARANESP) injection - DIALYSIS  200 mcg Intravenous Q Thu-HD  . divalproex  500 mg Oral Q12H  . docusate sodium  100 mg Oral BID  . escitalopram  10 mg Oral Daily  . feeding supplement (NEPRO CARB STEADY)  237 mL Oral BID BM  . ferric citrate  420 mg Oral TID WC  . gabapentin  100 mg Oral TID  . hydrALAZINE  100 mg Oral Q8H  . isosorbide dinitrate  5 mg Oral BID  . labetalol  300 mg Oral BID  . pantoprazole  40 mg Oral Daily  . pravastatin  20 mg Oral q1800

## 2016-12-13 NOTE — Progress Notes (Signed)
Occupational Therapy Session Note  Patient Details  Name: Hayden [name] MRN: [id number] Date of Birth: February 08, 1971  Today's Date: 12/13/2016 OT Individual Time: 3546-5681 and 2751-7001 OT Individual Time Calculation (min): 57 min and 58 min   Short Term Goals: Week 1:  OT Short Term Goal 1 (Week 1): STG=LTG 2/2 estimated short LOS  Skilled Therapeutic Interventions/Progress Updates:    1) Treatment session with focus on sit > stand and w/c mobility during self-care tasks.  Pt received upright in w/c reporting in better mood this AM.  Pt reports bathing and dressing at bed level prior to session with pt's wife confirming just set up for items.  Completed LB dressing at sit > stand level with focus on transitional movement and dynamic standing balance during weight shift to pull pants over hips.  Pt propelled w/c to therapy gym at Mod I level.  Orthotist present to provide shrinker and don.  Educated on wear schedule and proper fit.  Issued pt inspection mirror and educated on use to increase awareness of skin integrity and advocating for self with leg care, pt demonstrating proper use.  Sit > stand x3 with RW with min cues for technique, supervision overall.  Returned to room and left upright in w/c with wife present.  2) Treatment session with focus on transfers and sit > stand.  Pt propelled w/c to therapy gym at Mod I level.  Completed lateral scoot/squat transfer to therapy mat.  Engaged in Wii bowling progressing to standing with pt able to tolerate stand 8 mins.  Requesting to not engage in sit > stand as he will "loose my will" to move and mobility will become increasingly more difficult.  Engaged in 2 sets of 10 trunk rotation and over head presses with 2# medicine ball for strengthening and endurance.  Upon time to transfer back to mat, pt requiring increased cues due to internal distraction and fatigue.  With increased time, pt ultimately able to complete stand pivot transfer with RW  supervision.  Propelled back to room and left upright with all needs in reach.  Therapy Documentation Precautions:  Precautions Precautions: Fall Restrictions Weight Bearing Restrictions: Yes RLE Weight Bearing: Non weight bearing General:   Vital Signs: Therapy Vitals Temp: 98.7 F (37.1 C) Temp Source: Oral Pulse Rate: 84 Resp: 18 BP: (!) 172/67 Patient Position (if appropriate): Lying Oxygen Therapy SpO2: 96 % O2 Device: Not Delivered Pain: Pain Assessment Pain Assessment: 0-10 Pain Score: 5  Faces Pain Scale: No hurt Pain Type: Surgical pain Pain Location: Leg Pain Orientation: Right Pain Descriptors / Indicators: Aching Pain Frequency: Intermittent Pain Onset: Gradual Pain Intervention(s): Medication (See eMAR)  See Function Navigator for Current Functional Status.   Therapy/Group: Individual Therapy  Simonne Come 12/13/2016, 8:24 AM

## 2016-12-13 NOTE — Progress Notes (Signed)
[facility] PHYSICAL MEDICINE & REHABILITATION     PROGRESS NOTE  Subjective/Complaints:  Pt seen laying in bed this AM.  He slept well overnight.  He is pleased with plans to d/c [facility] today.  ROS: Denies CP, SOB, N/V/D.  Objective: Vital Signs: Blood pressure (!) 172/67, pulse 84, temperature 98.7 F (37.1 C), temperature source Oral, resp. rate 18, height 5\' 9"  (1.753 m), weight 83.6 kg (184 lb 4.9 oz), SpO2 96 %. No results found.  Recent Labs  12/10/16 1500 12/12/16 2252  WBC 14.9* 14.2*  HGB 8.5* 9.3*  HCT 27.1* 30.0*  PLT 508* 554*    Recent Labs  12/10/16 1500 12/12/16 2252  NA 134* 134*  K 3.9 3.8  CL 100* 99*  GLUCOSE 105* 103*  BUN 52* 23*  CREATININE 8.43* 3.89*  CALCIUM 7.1* 7.5*   CBG (last 3)  No results for input(s): GLUCAP in the last 72 hours.  Wt Readings from Last 3 Encounters:  12/13/16 83.6 kg (184 lb 4.9 oz)  12/08/16 83.4 kg (183 lb 13.8 oz)  12/02/16 86.6 kg (191 lb)    Physical Exam:  BP (!) 172/67 (BP Location: Right Arm)   Pulse 84   Temp 98.7 F (37.1 C) (Oral)   Resp 18   Ht 5\' 9"  (1.753 m)   Wt 83.6 kg (184 lb 4.9 oz)   SpO2 96%   BMI 27.22 kg/m  Constitutional: He appears well-developed and well-nourished.  HENT: Normocephalic and atraumatic.  Eyes: EOMI. No discharge.  Cardiovascular: RRR Respiratory: CTA Bilaterally without wheezes or rales. Normal effort   GI: Soft. Bowel sounds are normal.  Musculoskeletal: He exhibits tenderness. He exhibits no edema.  Neurological:  Alert  Motor: B/l UE, LLE: 5/5 proximal to distal RLE: HF 4+/5  Generalized twitching  Skin. AKA site clean/ shrinker in place Psych: Atypical affect, a little disinhibited  Assessment/Plan: 1. 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 Physiatrist is providing close team supervision and 24 hour management of  active medical problems listed below. Physiatrist and rehab team continue to assess barriers to discharge/monitor patient progress toward functional and medical goals.  Function:  Bathing Bathing position   Position: Bed  Bathing parts Body parts bathed by patient: Right arm, Left arm, Chest, Abdomen, Front perineal area, Buttocks, Right upper leg, Left upper leg, Left lower leg    Bathing assist Assist Level: Set up   Set up : To obtain items  Upper Body Dressing/Undressing Upper body dressing   What is the patient wearing?: Pull over shirt/dress     Pull over shirt/dress - Perfomed by patient: Thread/unthread right sleeve, Thread/unthread left sleeve, Put head through opening, Pull shirt over trunk          Upper body assist Assist Level: Set up   Set up : To obtain clothing/put away  Lower Body Dressing/Undressing Lower body dressing   What is the patient wearing?: Pants, Shoes     Pants- Performed by patient: Thread/unthread right pants leg, Thread/unthread left pants leg, Pull pants up/down           Shoes - Performed by patient: Don/doff left shoe, Fasten left            Lower body assist Assist for lower body dressing: Set up   Set up : To obtain clothing/put away  Toileting Toileting   Toileting steps completed by patient:  Adjust clothing prior to toileting, Adjust clothing after toileting, Performs perineal hygiene Toileting steps completed by helper: Adjust clothing prior to toileting, Performs perineal hygiene, Adjust clothing after toileting Toileting Assistive Devices: Grab bar or rail  Toileting assist Assist level: Touching or steadying assistance (Pt.75%)   Transfers Chair/bed transfer   Chair/bed transfer method: Squat pivot Chair/bed transfer assist level: Touching or steadying assistance (Pt > 75%) Chair/bed transfer assistive device: Armrests     Locomotion Ambulation     Max distance: 15 Assist level: Touching or steadying  assistance (Pt > 75%)   Wheelchair   Type: Manual Max wheelchair distance: 150' Assist Level: Supervision or verbal cues  Cognition Comprehension Comprehension assist level: Understands complex 90% of the time/cues 10% of the time  Expression Expression assist level: Expresses basic needs/ideas: With extra time/assistive device  Social Interaction Social Interaction assist level: Interacts appropriately with others with medication or extra time (anti-anxiety, antidepressant).  Problem Solving Problem solving assist level: Solves basic 90% of the time/requires cueing < 10% of the time  Memory Memory assist level: Recognizes or recalls 90% of the time/requires cueing < 10% of the time    Medical Problem List and Plan: 1.  Decreased functional mobility secondary to right AKA 12/04/2016 with postop delirium  Cont CIR   -ELOS 6/16  -Patient to see Rehab MD in the office for transitional care encounter in 1-2 weeks. 2.  DVT Prophylaxis/Anticoagulation: SCD left lower extremity 3. Pain Management: Neurontin 100 mg 3 times a day, Hydrocodone and Robaxin as needed. Monitor mental status  Monitor with increased mobility, controlled at present 4. Mood: Lexapro 10 mg daily, Depakote 500 mg every 12 hours, Ativan 1 mg every 6 hours as needed 5. Neuropsych: This patient is not capable of making decisions on his own behalf. 6. Skin/Wound Care: Routine skin checks 7. Fluids/Electrolytes/Nutrition: Routine I&Os 8. Acute blood loss anemia. Continue Aranesp  Hb 9.3 on 6/14  Labs with HD  Cont to monitor 9. ESRD. Hemodialysis as directed  -pt wants to go back to PD but apparently has failed PD. Plan is to move permanently to HD. Pt told me outright, that he will refuse to go on HD 10. Hypertension. Norvasc 10 mg daily, clonidine patch 0.2 mg weekly, hydralazine 100 mg every 8 hours, Isordil 5 mg twice a day, labetalol 300 mg twice a day. Monitor with increased mobility  - 11. Diastolic congestive heart  failure. Monitor for any signs of fluid overload Filed Weights   12/12/16 0500 12/12/16 1610 12/13/16 0500  Weight: 85.5 kg (188 lb 9.6 oz) 88.5 kg (195 lb 1.7 oz) 83.6 kg (184 lb 4.9 oz)  12. Hyperlipidemia. Pravachol 20 mg daily 13. Diabetes mellitus with peripheral neuropathy. Latest hemoglobin A1c 5.4. Check blood sugars before meals and at bedtime  Relatively controlled 6/12 14. Tobacco abuse. Counseling  LOS (Days) 4 A FACE TO FACE EVALUATION WAS PERFORMED  SWARTZ,ZACHARY T 12/13/2016 8:58 AM

## 2016-12-13 NOTE — Discharge Summary (Signed)
NAME:  Hayden [name], Hayden [name] NO.:  [id number]  MEDICAL RECORD NO.:  [id number]  LOCATION:                                 FACILITY:  PHYSICIAN:  Hayden Lesch, MD        DATE OF BIRTH:  1970/08/12  DATE OF ADMISSION:  12/09/2016 DATE OF DISCHARGE:  12/14/2016                              DISCHARGE SUMMARY   DISCHARGE DIAGNOSES: 1. 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 2. SCDs for left lower extremity. 3. Pain management. 4. Depression. 5. Acute blood loss anemia. 6. End-stage renal disease, hypertension, diastolic congestive heart     failure, hyperlipidemia, diabetes mellitus, peripheral neuropathy,     and tobacco abuse.  This is a 46 year old right-handed male with history of diastolic congestive heart failure, end-stage renal disease with peritoneal dialysis, diabetes mellitus, peripheral vascular disease with revascularization, recent admission to [facility] on Nov 20, 2016, until Nov 28, 2016, for debilitation after a right first digit amputation of left lower extremity.  He lives with his wife in a mobile home.  Admitted on December 04, 2016, with necrotic changes to the right tibial muscle and flexion contracture at the knee as well as recent fall on incision line and underwent right above knee amputation by Dr. Sharol [name] on December 04, 2016. Wound VAC as directed.  Postoperative confusion, lethargy due to encephalopathy that continued to improve.  Hospital course, postoperative pain, acute blood loss anemia.  The patient was admitted for a comprehensive rehab program.  PAST MEDICAL HISTORY:  See discharge diagnoses.  SOCIAL HISTORY:  He lives with spouse, mobile home, 1 level with 4 steps to entry.  FUNCTIONAL STATUS UPON ADMISSION TO REHAB SERVICES:  Min to mod assist for functional ability, sit to stand, needed multiple cues.  Min to mod assist activities of daily living.  PHYSICAL EXAMINATION:  VITAL SIGNS:   Blood pressure 173/73, pulse 76, temperature 97, respirations 16. GENERAL:  This was an alert male, oriented to person and place.  He could not recall full events of his hospital stay.  Followed full commands.  EOMs intact. NECK:  Supple and nontender.  No JVD. CARDIAC:  Regular rate and rhythm. ABDOMEN:  Soft and nontender.  Good bowel sounds. LUNGS:  Clear to auscultation without wheeze. EXTREMITIES:  AKA site dressed with wound VAC.  REHABILITATION HOSPITAL COURSE:  The patient was admitted to [facility]patient [facility] with therapies initiated on a 3-hour daily basis consisting of physical therapy, occupational therapy, and rehabilitation nursing.  The following issues were addressed during the patient's rehabilitation stay.  Pertaining to Hayden [name]' right AKA on December 04, 2016, he would follow up with Orthopedic Services, Dr. Sharol [name]. Postoperative pain management with use of Neurontin, hydrocodone, and Robaxin.  Monitoring of mental status.  Blood pressures controlled and monitored on Norvasc as well as a clonidine patch.  Mood continued to improve.  He remained on Lexapro as well as Depakote.  He was participating with therapies.  Acute blood loss anemia, 8.5, on iron supplement.  He remained with hemodialysis as per Renal Services transitioned from home peritoneal dialysis, would follow up outpatient.  Diastolic congestive heart failure, exhibited no signs of fluid overload.  Hemoglobin A1c of 5.4, blood sugars overall relatively controlled, diabetic teaching.  He did have a history of tobacco abuse, receiving full counseling in regard to cessation of nicotine products.  It was questionable if he would be compliant with these requests.  The patient received weekly collaborative interdisciplinary team conferences to discuss estimated length of stay, family teaching, any barriers to his discharge.  He was ambulating with a rolling walker, propelled his wheelchair to the rehab gym,  modified independent.  He did express some frustration at times with his therapies.  His wife remained on hand for full family teaching.  He did require some increased time and redirection and motivated at times. Supervision bed mobility, squat lateral, scoot transfers to his wheelchair.  He could propel his wheelchair throughout the unit over carpet tile at modified independent level.  Full family teaching was completed and plan discharge to home.  DISCHARGE MEDICATIONS:  Included Norvasc 10 mg p.o. daily, aspirin 325 mg p.o. daily, Rocaltrol 0.5 mcg p.o. daily, Sensipar 60 mg p.o. daily, clonidine patch 0.2 mg weekly, Depakote 500 mg p.o. every 12 hours, Colace 100 mg p.o. b.i.d., Lexapro 10 mg p.o. daily, ferric citrate 420 mg p.o. t.i.d., Neurontin 100 mg p.o. t.i.d., hydralazine 100 mg p.o. every 8 hours, isordil 5 mg p.o. b.i.d., labetalol 300 mg p.o. b.i.d., Protonix 40 mg p.o. daily, Pravachol 20 mg p.o. daily, hydrocodone 1-2 tablets every 4 as needed pain, Ativan 1 mg every 6 hours as needed anxiety, Robaxin 500 mg p.o. every 6 hours as needed muscle spasms.  DISCHARGE INSTRUCTIONS:  His diet was a diabetic diet.  He would follow up with Dr. Delice [name] at the Outpatient [facility]hab [facility] office as directed; Dr. Corliss [name], [facility]al Services; Dr. Meridee [name], call for appointment.  SPECIAL INSTRUCTIONS:  No smoking.  Continue dialysis as directed with arrangements made at [place], [place] first treatment 12/16/2016.Hayden [name], P.A.   ______________________________ Hayden Lesch, MD    DA/MEDQ  D:  12/13/2016  T:  12/13/2016  Job:  [id number]  cc:   Hayden [name] Hayden Lesch, MD Hayden Minion, MD Hayden [name], M.D.

## 2016-12-13 NOTE — Progress Notes (Signed)
Physical Therapy Discharge Summary  Patient Details  Name: Hayden [name] MRN: [id number] Date of Birth: May 05, 1971  Today's Date: 12/13/2016 PT Individual Time: 0998-3382 and 5053-9767 PT Individual Time Calculation (min): 55 min and 10 min  Pt performed w/c mobility in home and controlled environment at mod I level.  Gait 50' in controlled environment with supervision. Home environment gait with min A due to decreased attention and safety awareness.  Furniture transfers with supervision.  Pt continues to require supervision for set up and safety with transfers.  Car transfer with max cuing for safety, supervision assist.  UBE for activiyt tolerance and strengthening 4 min fwd/4 min bkwd level 5.  Pt's wife states she feels comfortable taking him home at this level of care.  Session 2:  RN asks PT to get pt back to bed for hemodialysis.  Pt with no c/o pain at rest.  Pt requires increased time and encouragement but is able to perform stand pivot transfer to bed with supervision, cues for safety.  Pt mod I with increased time for sit to supine.  Pt left in bed with needs at hand, RN aware.  Patient has met 6 of 8 long term goals due to improved activity tolerance, improved balance, increased strength, decreased pain and ability to compensate for deficits.  Patient to discharge at a wheelchair level Modified Independent at w/c level.  Patient's care partner is independent to provide the necessary supervision assistance at discharge for gait and car transfers  Reasons goals not met: pt continues to require min A for gait in home environment, supervision for transfers due to impaired cognition  Recommendation:  Patient will benefit from ongoing skilled PT services in home health setting to continue to advance safe functional mobility, address ongoing impairments in strength, pain, gait, and minimize fall risk.  Equipment: No equipment provided  Reasons for discharge: treatment goals met and  discharge from hospital  Patient/family agrees with progress made and goals achieved: Yes  PT Discharge Precautions/Restrictions Precautions Precautions: Fall Restrictions Weight Bearing Restrictions: Yes RLE Weight Bearing: Non weight bearing Pain Pain Assessment Pain Assessment: 0-10 Pain Score: 3  Pain Location: Leg Pain Orientation: Right Pain Descriptors / Indicators: Aching Pain Onset: On-going Pain Intervention(s): Repositioned;Rest  Cognition Overall Cognitive Status: History of cognitive impairments - at baseline Orientation Level: Oriented X4 Sensation Sensation Light Touch Impaired Details: Impaired RLE Proprioception: Appears Intact Coordination Gross Motor Movements are Fluid and Coordinated: Yes Fine Motor Movements are Fluid and Coordinated: Yes Motor  Motor Motor: Within Functional Limits   Trunk/Postural Assessment  Cervical Assessment Cervical Assessment: Within Functional Limits Thoracic Assessment Thoracic Assessment: Within Functional Limits Lumbar Assessment Lumbar Assessment: Within Functional Limits Postural Control Righting Reactions: delayed  Balance Dynamic Sitting Balance Dynamic Sitting - Level of Assistance: 6: Modified independent (Device/Increase time) Static Standing Balance Static Standing - Balance Support: During functional activity;Bilateral upper extremity supported Static Standing - Level of Assistance: 5: Stand by assistance Extremity Assessment      RLE AROM (degrees) RLE Overall AROM Comments: limited by pain, but WFL RLE Strength RLE Overall Strength Comments: limited by pain, grossly 3-/5 LLE Assessment LLE Assessment: Within Functional Limits   See Function Navigator for Current Functional Status.  Choua Ikner 12/13/2016, 9:20 AM

## 2016-12-13 NOTE — Discharge Summary (Signed)
Discharge summary job [id number]

## 2016-12-13 NOTE — Progress Notes (Signed)
Social Work  Discharge Note  The overall goal for the admission was met for:   Discharge location: Yes - home with wife and family able to provide 24/7 assistance  Length of Stay: Yes - 8 days (with discharge on 6/16)  Discharge activity level: Yes - supervision @ w/c level  Home/community participation: Yes  Services provided included: MD, RD, PT, OT, RN, TR, Pharmacy and [place]: Medicare and Private Insurance: [facility]  Follow-up services arranged: Home Health: Therapist, sports, PT, OT via [facility] , DME: drop arm commode via [facility] and Patient/Family has no preference for HH/DME agencies  Comments (or additional information):  Patient/Family verbalized understanding of follow-up arrangements: Yes  Individual responsible for coordination of the follow-up plan: pt/ wife  Confirmed correct DME delivered: Lennart Pall 12/13/2016    Hayden [name]

## 2016-12-13 NOTE — Progress Notes (Signed)
Accepted at [facility]st treatment is Monday 12/16/16 at 12:00pm Tentative dialysis schedule is Monday ,Wednesday ,Friday  chairtime at 12:30pm.

## 2016-12-13 NOTE — Progress Notes (Signed)
Occupational Therapy Discharge Summary  Patient Details  Name: Hayden [name] MRN: [id number] Date of Birth: 1971/06/19  Patient has met 7 of 7 long term goals due to improved activity tolerance, improved balance, postural control and ability to compensate for deficits.  Patient to discharge at overall Supervision level.  Patient's care partner is independent to provide the necessary physical and cognitive assistance at discharge.  Patient's wife has been present during majority of sessions and has demonstrated ability to provide cues and assistance as needed.  Reasons goals not met: NA  Recommendation:  Patient will benefit from ongoing skilled OT services in home health setting to continue to advance functional skills in the area of BADL and Reduce care partner burden.  Equipment: drop arm BSC  Reasons for discharge: treatment goals met and discharge from hospital  Patient/family agrees with progress made and goals achieved: Yes  OT Discharge Precautions/Restrictions  Precautions Precautions: Fall Restrictions RLE Weight Bearing: Non weight bearing Pain Pain Assessment Pain Score: 3  Faces Pain Scale: Hurts a little bit Pain Location: Leg Pain Orientation: Right Pain Descriptors / Indicators: Aching Pain Onset: On-going Pain Intervention(s): Repositioned;Rest ADL ADL ADL Comments: see functional navigator  Vision Baseline Vision/History: Wears glasses Wears Glasses: At all times Patient Visual Report: No change from baseline Vision Assessment?: No apparent visual deficits Perception  Perception: Within Functional Limits Praxis Praxis: Intact Cognition Overall Cognitive Status: History of cognitive impairments - at baseline Arousal/Alertness: Awake/alert Orientation Level: Oriented X4 Memory: Impaired Memory Impairment: Decreased recall of new information;Decreased short term memory Behaviors: Poor frustration tolerance Safety/Judgment: Appears  intact Sensation Sensation Light Touch: Impaired Detail Light Touch Impaired Details: Impaired RLE Proprioception: Appears Intact Coordination Gross Motor Movements are Fluid and Coordinated: Yes Fine Motor Movements are Fluid and Coordinated: Yes Motor  Motor Motor: Within Functional Limits Mobility     Trunk/Postural Assessment  Cervical Assessment Cervical Assessment: Within Functional Limits Thoracic Assessment Thoracic Assessment: Within Functional Limits Lumbar Assessment Lumbar Assessment: Within Functional Limits Postural Control Righting Reactions: delayed  Balance Dynamic Sitting Balance Dynamic Sitting - Level of Assistance: 6: Modified independent (Device/Increase time) Static Standing Balance Static Standing - Balance Support: During functional activity;Bilateral upper extremity supported Static Standing - Level of Assistance: 5: Stand by assistance Extremity/Trunk Assessment RUE Assessment RUE Assessment: Within Functional Limits LUE Assessment LUE Assessment: Within Functional Limits   See Function Navigator for Current Functional Status.  Simonne Come 12/13/2016, 12:19 PM

## 2016-12-13 NOTE — Discharge Instructions (Signed)
Inpatient Rehab Discharge Instructions  Hayden [name] Discharge date and time: No discharge date for patient encounter.   Activities/Precautions/ Functional Status: Activity: activity as tolerated Diet: renal diet Wound Care: keep wound clean and dry Functional status:  ___ No restrictions     ___ Walk up steps independently ___ 24/7 supervision/assistance   ___ Walk up steps with assistance ___ Intermittent supervision/assistance  ___ Bathe/dress independently ___ Walk with walker     __x_ Bathe/dress with assistance ___ Walk Independently    ___ Shower independently ___ Walk with assistance    ___ Shower with assistance ___ No alcohol     ___ Return to work/school ________    COMMUNITY REFERRALS UPON DISCHARGE:    Home Health:   PT     OT      RN                     Agency: [facility]    Phone:  [phone]   Medical Equipment/Items Ordered: drop arm commode                                                     Agency/Supplier:  [facility] @ [phone]   GENERAL COMMUNITY RESOURCES FOR PATIENT/FAMILY:  Support Groups: amputee support group      Special Instructions: Continue hemodialysis as directed   My questions have been answered and I understand these instructions. I will adhere to these goals and the provided educational materials after my discharge from the hospital.  Patient/Caregiver Signature _______________________________ Date __________  Clinician Signature _______________________________________ Date __________  Please bring this form and your medication list with you to all your follow-up doctor's appointments.

## 2016-12-14 ENCOUNTER — Encounter (HOSPITAL_COMMUNITY): Payer: Self-pay | Admitting: *Deleted

## 2016-12-14 LAB — HEPATITIS B SURFACE ANTIBODY,QUALITATIVE: Hep B S Ab: REACTIVE

## 2016-12-14 LAB — HEPATITIS B SURFACE ANTIGEN: Hepatitis B Surface Ag: NEGATIVE

## 2016-12-14 LAB — HEPATITIS B CORE ANTIBODY, TOTAL: HEP B C TOTAL AB: NEGATIVE

## 2016-12-14 NOTE — Progress Notes (Signed)
Patient discharged to home with all prescriptions and discharge instructions given yesterday. Patient and patient's wife denied any questions about stump incision and wrapping. Patient discharged about 1046.

## 2016-12-14 NOTE — Progress Notes (Signed)
Hayden [name] is a 46 y.o. male 1970-07-19 [id number]  Subjective: No new complaints. No new problems. Slept well. Feeling OK.  Objective: Vital signs in last 24 hours: Temp:  [98.2 F (36.8 C)-98.7 F (37.1 C)] 98.5 F (36.9 C) (06/16 0539) Pulse Rate:  [78-92] 91 (06/16 0539) Resp:  [16-18] 18 (06/16 0539) BP: (145-198)/(45-93) 193/86 (06/16 0539) SpO2:  [98 %-100 %] 98 % (06/16 0539) Weight:  [177 lb 4 oz (80.4 kg)-185 lb 3 oz (84 kg)] 180 lb 5.4 oz (81.8 kg) (06/16 0539) Weight change: -9 lb 14.7 oz (-4.5 kg) Last BM Date: 12/13/16  Intake/Output from previous day: 06/15 0701 - 06/16 0700 In: 480 [P.O.:480] Out: 3800  Last cbgs: CBG (last 3)  No results for input(s): GLUCAP in the last 72 hours.   Physical Exam General: No apparent distress   HEENT: not dry Lungs: Normal effort. Lungs clear to auscultation, no crackles or wheezes. Cardiovascular: Regular rate and rhythm, no edema Abdomen: S/NT/ND; BS(+) Musculoskeletal:  unchanged Neurological: No new neurological deficits Wounds: clean    Skin: clear   Mental state: Alert, oriented, cooperative In a w/c    Lab Results: BMET    Component Value Date/Time   NA 137 12/13/2016 1414   K 3.9 12/13/2016 1414   CL 102 12/13/2016 1414   CO2 24 12/13/2016 1414   GLUCOSE 114 (H) 12/13/2016 1414   BUN 42 (H) 12/13/2016 1414   CREATININE 5.36 (H) 12/13/2016 1414   CALCIUM 7.5 (L) 12/13/2016 1414   GFRNONAA 12 (L) 12/13/2016 1414   GFRAA 14 (L) 12/13/2016 1414   CBC    Component Value Date/Time   WBC 16.5 (H) 12/13/2016 1414   RBC 3.19 (L) 12/13/2016 1414   HGB 8.4 (L) 12/13/2016 1414   HCT 27.6 (L) 12/13/2016 1414   PLT 577 (H) 12/13/2016 1414   MCV 86.5 12/13/2016 1414   MCH 26.3 12/13/2016 1414   MCHC 30.4 12/13/2016 1414   RDW 18.2 (H) 12/13/2016 1414   LYMPHSABS 1.9 11/28/2016 0547   MONOABS 1.7 (H) 11/28/2016 0547   EOSABS 0.9 (H) 11/28/2016 0547   BASOSABS 0.2 (H) 11/28/2016 0547     Studies/Results: No results found.  Medications: I have reviewed the patient's current medications.  Assessment/Plan:    1. S/p R AKA - d/c home 2. Chronic pain - Gabapentin, Norco, Robaxin prn 3. Depression: Lexapro 4. Anemia - Aranesp 5. ESRD - on HD 6. HTN: cont with Norvasc 10 mg daily, clonidine patch 0.2 mg weekly, hydralazine 100 mg every 8 hours, Isordil 5 mg twice a day, labetalol 300 mg twice a day. 7. CHF on meds 8. Dyslipidemia - Pravachol     Length of stay, days: 5  Walker Kehr , MD 12/14/2016, 8:37 AM

## 2016-12-19 ENCOUNTER — Encounter (INDEPENDENT_AMBULATORY_CARE_PROVIDER_SITE_OTHER): Payer: Self-pay | Admitting: Orthopedic Surgery

## 2016-12-19 ENCOUNTER — Ambulatory Visit (INDEPENDENT_AMBULATORY_CARE_PROVIDER_SITE_OTHER): Payer: Medicare Other | Admitting: Orthopedic Surgery

## 2016-12-19 ENCOUNTER — Ambulatory Visit (INDEPENDENT_AMBULATORY_CARE_PROVIDER_SITE_OTHER): Payer: Medicare Other

## 2016-12-19 VITALS — Ht 69.0 in | Wt 180.0 lb

## 2016-12-19 DIAGNOSIS — M25522 Pain in left elbow: Secondary | ICD-10-CM

## 2016-12-19 DIAGNOSIS — Z89611 Acquired absence of right leg above knee: Secondary | ICD-10-CM

## 2016-12-19 DIAGNOSIS — S78111A Complete traumatic amputation at level between right hip and knee, initial encounter: Secondary | ICD-10-CM

## 2016-12-19 DIAGNOSIS — I70261 Atherosclerosis of native arteries of extremities with gangrene, right leg: Secondary | ICD-10-CM

## 2016-12-19 NOTE — Progress Notes (Signed)
Office Visit Note   Patient: Hayden [name]           Date of Birth: [birth year]           MRN: [id number] Visit Date: 12/19/2016              Requested by: Corliss Parish, MD [address] [place], [place] [address] PCP: Corliss Parish, MD  Chief Complaint  Patient presents with  . Right Leg - Routine Post Op    12/04/16 Rt AKA ~2 weeks post op  . Left Elbow - Pain      HPI: Patient is 45 weeks status post right above-the-knee amputation. Patient states he recently fell and struck his left forearm over the ulnar border. Patient complains of pain.  Assessment & Plan: Visit Diagnoses:  1. Above knee amputation of right lower extremity (HCC)   2. Pain in left elbow   3. Atherosclerosis of native arteries of extremities with gangrene, right leg (HCC)     Plan: Continue with dial soap cleansing of the right above-the-knee amputation. Recommended that he wear the stump shrinker as much as possible. He states he is currently not wearing it all the time. Evaluate to remove the sutures and staples at follow-up.  Follow-Up Instructions: Return in about 1 week (around 12/26/2016).   Ortho Exam  Patient is alert, oriented, no adenopathy, well-dressed, normal affect, normal respiratory effort. Examination patient's wound is well approximated there is some redness from the sutures from swelling but no cellulitis no drainage no odor no signs of infection. The left elbow has full range of motion is tender to palpation over the ulnar border.  Imaging: Xr Elbow 2 Views Left  Result Date: 12/19/2016 Two-view radiographs of the left elbow shows no evidence of the fractured ulna or radius and radial head or neck fracture. Patient has extensive calcification of the arteries and the arm and forearm.   Labs: Lab Results  Component Value Date   HGBA1C 5.4 06/20/2015   HGBA1C 9.9 (H) 09/22/2012   HGBA1C (H) 12/22/2008    11.5 (NOTE) The ADA recommends the following therapeutic goal for  glycemic control related to Hgb A1c measurement: Goal of therapy: <6.5 Hgb A1c  Reference: American Diabetes Association: Clinical Practice Recommendations 2010, Diabetes Care, 2010, 33: (Suppl  1).   REPTSTATUS 11/14/2016 FINAL 11/09/2016   REPTSTATUS 11/09/2016 FINAL 11/09/2016   GRAMSTAIN NO WBC SEEN NO ORGANISMS SEEN  11/09/2016   CULT NO GROWTH 5 DAYS 11/09/2016    Orders:  Orders Placed This Encounter  Procedures  . XR Elbow 2 Views Left   No orders of the defined types were placed in this encounter.    Procedures: No procedures performed  Clinical Data: No additional findings.  ROS:  All other systems negative, except as noted in the HPI. Review of Systems  Objective: Vital Signs: Ht 5\' 9"  (1.753 m)   Wt 180 lb (81.6 kg)   BMI 26.58 kg/m   Specialty Comments:  No specialty comments available.  PMFS History: Patient Active Problem List   Diagnosis Date Noted  . Prediabetes   . Phantom limb pain ([place])   . Hypertensive urgency   . Type 2 diabetes mellitus with peripheral neuropathy (HCC)   . Amputee, above knee, right ([place]) 12/09/2016  . Chronic diastolic heart failure ([place])   . Type 2 diabetes mellitus with diabetic peripheral angiopathy without gangrene, without long-term current use of insulin ([place])   . Unilateral AKA, right ([place])   .  ESRD on dialysis ([place])   . Above knee amputation of right lower extremity ([place]) 12/04/2016  . Atherosclerosis of native arteries of extremities with gangrene, right leg ([place]) 12/02/2016  . Neuropathic pain   . Hypertensive crisis   . Labile blood pressure   . Type 2 diabetes mellitus with diabetic peripheral angiopathy and gangrene, without long-term current use of insulin ([place])   . Debility 11/22/2016  . Anemia of chronic disease   . Diabetes mellitus type 2 in nonobese (HCC)   . Confusion   . S/P femoral-popliteal bypass surgery   . Tobacco abuse   . Benign essential HTN   . History of CVA (cerebrovascular  accident)   . Post-operative pain   . Acute respiratory failure with hypoxia ([place])   . Agitation 11/04/2016  . Acute encephalopathy 11/04/2016  . Cellulitis of right lower extremity   . Hypokalemia 11/02/2016  . QT prolongation   . Gangrene of right foot ([place])   . Pressure injury of skin 10/24/2016  . Cellulitis of great toe of right foot 10/23/2016  . Leukocytosis 10/23/2016  . Hyperkalemia 10/23/2016  . Abdominal hematoma 11/15/2015  . HCAP (healthcare-associated pneumonia) 11/15/2015  . Acute blood loss anemia 11/15/2015  . Absolute anemia   . Right lower quadrant pain   . Pneumonia involving right lung 11/04/2015  . HTN (hypertension) 11/04/2015  . Noninfectious gastroenteritis and colitis 11/04/2015  . Gastroenteritis 11/04/2015  . CVA (cerebral infarction) 06/19/2015  . End stage renal disease on dialysis ([place]) 06/19/2015  . Lacunar infarct, acute ([place]) 06/19/2015  . GERD (gastroesophageal reflux disease) 06/19/2015  . Anxiety and depression 06/19/2015  . Hypertension, well controlled 06/19/2015  . End stage renal disease ([place]) 01/21/2013  . Acute coronary syndrome ([place]) 09/22/2012  . History of partial Right nephrectomy for renal mass (2008) 09/22/2012  . Left ventricular hypertrophy (moderate) per ECHO 2010 09/22/2012  . Chronic systolic congestive heart failure/EF 40% PER echo 2010 09/22/2012  . 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 Acute kidney injury ([place]) 09/21/2012  . Chest pain 09/21/2012   Past Medical History:  Diagnosis Date  . Anemia March 2014  . Cancer ([place])    Kidney  . CHF (congestive heart failure) ([place]) 7124   Acute systolic and diastolic CHF  . Depression    & rage --  was in counseling....great now  . Diabetes mellitus    NO DM SINCE LOST 130LBS  . ESRD on peritoneal dialysis ([place]) 2018   Started in-center HD approx 2015 for 2 years, then did about 1 year of home HD and then started peritoneal dialysis in early 2018.    . Gangrene  ([place])    right leg and foot  . History of kidney cancer   . Hyperlipidemia   . Hypertension   . Obesity   . PONV (postoperative nausea and vomiting)   . Stroke [facility])     Family History  Problem Relation Age of Onset  . Heart disease Mother        Heart Disease before age 2  . Deep vein thrombosis Father   . Heart attack Father   . Other Other     Past Surgical History:  Procedure Laterality Date  . ABDOMINAL AORTOGRAM W/LOWER EXTREMITY N/A 10/28/2016   Procedure: Abdominal Aortogram w/Lower Extremity;  Surgeon: Angelia Mould, MD;  Location: [place] [facility];  Service: Cardiovascular;  Laterality: N/A;  . AMPUTATION Right 11/05/2016   Procedure: AMPUTATION RIGHT FIRST RAY;  Surgeon: Adele Barthel  L, MD;  Location: [place];  Service: Vascular;  Laterality: Right;  . AMPUTATION Right 12/04/2016   Procedure: RIGHT ABOVE KNEE AMPUTATION;  Surgeon: Newt Minion, MD;  Location: [place];  Service: Orthopedics;  Laterality: Right;  . AV FISTULA PLACEMENT Left 01/26/2013   Procedure: ARTERIOVENOUS (AV) FISTULA CREATION - LEFT RADIAL CEPHALIC AVF;  Surgeon: Angelia Mould, MD;  Location: [place];  Service: Vascular;  Laterality: Left;  . CHOLECYSTECTOMY  11/06/2015   Procedure: LAPAROSCOPIC CHOLECYSTECTOMY;  Surgeon: Ralene Ok, MD;  Location: [place];  Service: General;;  . FEMORAL-POPLITEAL BYPASS GRAFT Right 11/05/2016   Procedure: BYPASS GRAFT FEMORAL-POPLITEAL ARTERY USING NON-REVERSED RIGHT GREATER SAPPHENOUS VEIN;  Surgeon: Conrad [place], MD;  Location: [place];  Service: Vascular;  Laterality: Right;  . HERNIA REPAIR    . LOWER EXTREMITY ANGIOGRAM Right 10/30/2016   Procedure: Right  LOWER EXTREMITY ANGIOGRAM WITH RIGHT SUPERFICIAL FEMORAL ARTERY balloon angioplasty;  Surgeon: Conrad [place], MD;  Location: [place];  Service: Vascular;  Laterality: Right;  . NEPHRECTOMY Right 2008   partial  . TESTICLE TORSION REDUCTION    . TONSILLECTOMY AND ADENOIDECTOMY    . VEIN HARVEST Right  11/05/2016   Procedure: RIGHT GREATER SAPPHENOUS VEIN HARVEST;  Surgeon: Conrad [place]oma, MD;  Location: [facility];  Service: Vascular;  Laterality: Right;   Social History   Occupational History  . [employer]    Social History Main Topics  . Smoking status: Current Some Day Smoker    Packs/day: 0.25    Years: 10.00    Types: Cigarettes  . Smokeless tobacco: Never Used  . Alcohol use No     Comment: "6 pack occ."-- lasts him six months-STOPPED 2 YRS AGO  . Drug use: Yes    Types: Marijuana     Comment: just in high school   . Sexual activity: Yes    Birth control/ protection: None

## 2016-12-23 ENCOUNTER — Telehealth (INDEPENDENT_AMBULATORY_CARE_PROVIDER_SITE_OTHER): Payer: Self-pay | Admitting: Orthopedic Surgery

## 2016-12-23 ENCOUNTER — Telehealth (INDEPENDENT_AMBULATORY_CARE_PROVIDER_SITE_OTHER): Payer: Self-pay

## 2016-12-23 NOTE — Telephone Encounter (Signed)
Angie with [facility] called concerning a Rx for pain for patient.  CB# is [phone].

## 2016-12-23 NOTE — Telephone Encounter (Signed)
Called and lm on vm to advise too soo to refill rx per Dr. Sharol Given. To call with any questions.

## 2016-12-23 NOTE — Telephone Encounter (Signed)
I called and sw angie to advise that I had called and lm on vm to advise pt earlier today that could not refill medication at this time. I advised that we would see him in office to evaluate why he is having pain to this degree and make sure that the surgical site is ok. Angie advised that there were no obvious signs of infection and confirmed with the pt an appt for tomorrow with Dr. Sharol Given.

## 2016-12-23 NOTE — Telephone Encounter (Signed)
Pt is s/p a right AKA 12/04/16 requesting refill on pain medication 6/15 # 60 10/325 Vicodin  and 5/31 5/325 #30 please advise.

## 2016-12-23 NOTE — Telephone Encounter (Signed)
Message sent to assistant concerning a Rx for pain for patient.

## 2016-12-23 NOTE — Telephone Encounter (Signed)
Call patient, too soon for refill

## 2016-12-23 NOTE — Telephone Encounter (Signed)
Patient's wife Joelene Millin) called advised patient need Rx refilled (Hydrocodone) Patient is out of medication.  The number to contact Joelene Millin is [phone] cell

## 2016-12-24 ENCOUNTER — Ambulatory Visit (INDEPENDENT_AMBULATORY_CARE_PROVIDER_SITE_OTHER): Payer: Medicare Other | Admitting: Orthopedic Surgery

## 2016-12-24 ENCOUNTER — Encounter (INDEPENDENT_AMBULATORY_CARE_PROVIDER_SITE_OTHER): Payer: Self-pay | Admitting: Orthopedic Surgery

## 2016-12-24 VITALS — Ht 69.0 in | Wt 180.0 lb

## 2016-12-24 DIAGNOSIS — Z89611 Acquired absence of right leg above knee: Secondary | ICD-10-CM

## 2016-12-24 DIAGNOSIS — S78111A Complete traumatic amputation at level between right hip and knee, initial encounter: Secondary | ICD-10-CM

## 2016-12-24 MED ORDER — HYDROCODONE-ACETAMINOPHEN 10-325 MG PO TABS: 1.0000 | ORAL_TABLET | Freq: Four times a day (QID) | ORAL | 0 refills | Status: DC | PRN

## 2016-12-24 NOTE — Progress Notes (Signed)
Office Visit Note   Patient: Hayden [name]           Date of Birth: 1970/10/24           MRN: [id number] Visit Date: 12/24/2016              Requested by: Corliss Parish, MD [address] [place],  [address] PCP: Corliss Parish, MD  Chief Complaint  Patient presents with  . Right Leg - Routine Post Op    12/04/16 right AKA      HPI: Patient is 45 weeks status post right above-the-knee amputation. Patient complains of some ischemic pain he is been using Norco.  Assessment & Plan: Visit Diagnoses:  1. Above knee amputation of right lower extremity ([place])     Plan: Continue dialysis of cleansing dry dressing change daily. Patient is given a refill prescription for pain medication and we will have him increase his dose of Neurontin.  Follow-Up Instructions: Return in about 2 weeks (around 01/07/2017).   Ortho Exam  Patient is alert, oriented, no adenopathy, well-dressed, normal affect, normal respiratory effort. Examination the wound edges are well approximated is one area in the mid aspect which is about 1 cm in diameter with breakdown with ischemic changes there is no black gangrenous changes no odor no drainage no cellulitis. The ulcer is about 3 mm deep. Plan to harvest the sutures and staples today and apply a dry dressing.  Imaging: No results found.  Labs: Lab Results  Component Value Date   HGBA1C 5.4 06/20/2015   HGBA1C 9.9 (H) 09/22/2012   HGBA1C (H) 12/22/2008    11.5 (NOTE) The ADA recommends the following therapeutic goal for glycemic control related to Hgb A1c measurement: Goal of therapy: <6.5 Hgb A1c  Reference: American Diabetes Association: Clinical Practice Recommendations 2010, Diabetes Care, 2010, 33: (Suppl  1).   REPTSTATUS 11/14/2016 FINAL 11/09/2016   REPTSTATUS 11/09/2016 FINAL 11/09/2016   GRAMSTAIN NO WBC SEEN NO ORGANISMS SEEN  11/09/2016   CULT NO GROWTH 5 DAYS 11/09/2016    Orders:  No orders of the defined types were  placed in this encounter.  Meds ordered this encounter  Medications  . HYDROcodone-acetaminophen (NORCO) 10-325 MG tablet    Sig: Take 1-2 tablets by mouth every 6 (six) hours as needed (breakthrough pain).    Dispense:  30 tablet    Refill:  0     Procedures: No procedures performed  Clinical Data: No additional findings.  ROS:  All other systems negative, except as noted in the HPI. Review of Systems  Objective: Vital Signs: Ht 5\' 9"  (1.753 m)   Wt 180 lb (81.6 kg)   BMI 26.58 kg/m   Specialty Comments:  No specialty comments available.  PMFS History: Patient Active Problem List   Diagnosis Date Noted  . Prediabetes   . Phantom limb pain ([place])   . Hypertensive urgency   . Type 2 diabetes mellitus with peripheral neuropathy (HCC)   . Amputee, above knee, right ([place]) 12/09/2016  . Chronic diastolic heart failure ([place])   . Type 2 diabetes mellitus with diabetic peripheral angiopathy without gangrene, without long-term current use of insulin ([place])   . Unilateral AKA, right ([place])   . ESRD on dialysis ([place])   . Above knee amputation of right lower extremity ([place]) 12/04/2016  . Atherosclerosis of native arteries of extremities with gangrene, right leg ([place]) 12/02/2016  . Neuropathic pain   . Hypertensive crisis   . Labile blood pressure   .  Type 2 diabetes mellitus with diabetic peripheral angiopathy and gangrene, without long-term current use of insulin ([place])   . Debility 11/22/2016  . Anemia of chronic disease   . Diabetes mellitus type 2 in nonobese (HCC)   . Confusion   . S/P femoral-popliteal bypass surgery   . Tobacco abuse   . Benign essential HTN   . History of CVA (cerebrovascular accident)   . Post-operative pain   . Acute respiratory failure with hypoxia ([place])   . Agitation 11/04/2016  . Acute encephalopathy 11/04/2016  . Cellulitis of right lower extremity   . Hypokalemia 11/02/2016  . QT prolongation   . Gangrene of right foot ([place])   . Pressure  injury of skin 10/24/2016  . Cellulitis of great toe of right foot 10/23/2016  . Leukocytosis 10/23/2016  . Hyperkalemia 10/23/2016  . Abdominal hematoma 11/15/2015  . HCAP (healthcare-associated pneumonia) 11/15/2015  . Acute blood loss anemia 11/15/2015  . Absolute anemia   . Right lower quadrant pain   . Pneumonia involving right lung 11/04/2015  . HTN (hypertension) 11/04/2015  . Noninfectious gastroenteritis and colitis 11/04/2015  . Gastroenteritis 11/04/2015  . CVA (cerebral infarction) 06/19/2015  . End stage renal disease on dialysis ([place]) 06/19/2015  . Lacunar infarct, acute ([place]) 06/19/2015  . GERD (gastroesophageal reflux disease) 06/19/2015  . Anxiety and depression 06/19/2015  . Hypertension, well controlled 06/19/2015  . End stage renal disease ([place]) 01/21/2013  . Acute coronary syndrome ([place]) 09/22/2012  . History of partial Right nephrectomy for renal mass (2008) 09/22/2012  . Left ventricular hypertrophy (moderate) per ECHO 2010 09/22/2012  . Chronic systolic congestive heart failure/EF 40% PER echo 2010 09/22/2012  . 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 Acute kidney injury ([place]) 09/21/2012  . Chest pain 09/21/2012   Past Medical History:  Diagnosis Date  . Anemia March 2014  . Cancer ([place])    Kidney  . CHF (congestive heart failure) ([place]) 6295   Acute systolic and diastolic CHF  . Depression    & rage --  was in counseling....great now  . Diabetes mellitus    NO DM SINCE LOST 130LBS  . ESRD on peritoneal dialysis ([place]) 2018   Started in-center HD approx 2015 for 2 years, then did about 1 year of home HD and then started peritoneal dialysis in early 2018.    . Gangrene ([place])    right leg and foot  . History of kidney cancer   . Hyperlipidemia   . Hypertension   . Obesity   . PONV (postoperative nausea and vomiting)   . Stroke [facility])     Family History  Problem Relation Age of Onset  . Heart disease Mother        Heart Disease before age  74  . Deep vein thrombosis Father   . Heart attack Father   . Other Other     Past Surgical History:  Procedure Laterality Date  . ABDOMINAL AORTOGRAM W/LOWER EXTREMITY N/A 10/28/2016   Procedure: Abdominal Aortogram w/Lower Extremity;  Surgeon: Angelia Mould, MD;  Location: [facility] [facility] LAB;  Service: Cardiovascular;  Laterality: N/A;  . AMPUTATION Right 11/05/2016   Procedure: AMPUTATION RIGHT FIRST RAY;  Surgeon: Conrad [place], MD;  Location: [facility];  Service: Vascular;  Laterality: Right;  . AMPUTATION Right 12/04/2016   Procedure: RIGHT ABOVE KNEE AMPUTATION;  Surgeon: Newt Minion, MD;  Location: [facility];  Service: Orthopedics;  Laterality: Right;  . AV FISTULA PLACEMENT Left 01/26/2013  Procedure: ARTERIOVENOUS (AV) FISTULA CREATION - LEFT RADIAL CEPHALIC AVF;  Surgeon: Angelia Mould, MD;  Location: [facility];  Service: Vascular;  Laterality: Left;  . CHOLECYSTECTOMY  11/06/2015   Procedure: LAPAROSCOPIC CHOLECYSTECTOMY;  Surgeon: Ralene Ok, MD;  Location: [place];  Service: General;;  . FEMORAL-POPLITEAL BYPASS GRAFT Right 11/05/2016   Procedure: BYPASS GRAFT FEMORAL-POPLITEAL ARTERY USING NON-REVERSED RIGHT GREATER SAPPHENOUS VEIN;  Surgeon: Conrad [place], MD;  Location: [facility];  Service: Vascular;  Laterality: Right;  . HERNIA REPAIR    . LOWER EXTREMITY ANGIOGRAM Right 10/30/2016   Procedure: Right  LOWER EXTREMITY ANGIOGRAM WITH RIGHT SUPERFICIAL FEMORAL ARTERY balloon angioplasty;  Surgeon: Conrad Foyil, MD;  Location: [facility];  Service: Vascular;  Laterality: Right;  . NEPHRECTOMY Right 2008   partial  . TESTICLE TORSION REDUCTION    . TONSILLECTOMY AND ADENOIDECTOMY    . VEIN HARVEST Right 11/05/2016   Procedure: RIGHT GREATER SAPPHENOUS VEIN HARVEST;  Surgeon: Conrad [place], MD;  Location: [place];  Service: Vascular;  Laterality: Right;   Social History   Occupational History  . [employer]    Social History Main Topics  . Smoking status: Current Some Day Smoker      Packs/day: 0.25    Years: 10.00    Types: Cigarettes  . Smokeless tobacco: Never Used  . Alcohol use No     Comment: "6 pack occ."-- lasts him six months-STOPPED 2 YRS AGO  . Drug use: Yes    Types: Marijuana     Comment: just in high school   . Sexual activity: Yes    Birth control/ protection: None

## 2016-12-26 ENCOUNTER — Ambulatory Visit (INDEPENDENT_AMBULATORY_CARE_PROVIDER_SITE_OTHER): Payer: Medicare Other | Admitting: Orthopedic Surgery

## 2017-01-02 ENCOUNTER — Telehealth: Payer: Self-pay | Admitting: Physical Medicine & Rehabilitation

## 2017-01-02 DIAGNOSIS — E1122 Type 2 diabetes mellitus with diabetic chronic kidney disease: Secondary | ICD-10-CM | POA: Diagnosis not present

## 2017-01-02 DIAGNOSIS — Z89611 Acquired absence of right leg above knee: Secondary | ICD-10-CM | POA: Diagnosis not present

## 2017-01-02 DIAGNOSIS — Z9181 History of falling: Secondary | ICD-10-CM | POA: Diagnosis not present

## 2017-01-02 DIAGNOSIS — E1142 Type 2 diabetes mellitus with diabetic polyneuropathy: Secondary | ICD-10-CM | POA: Diagnosis not present

## 2017-01-02 DIAGNOSIS — N186 End stage renal disease: Secondary | ICD-10-CM | POA: Diagnosis not present

## 2017-01-02 DIAGNOSIS — E1151 Type 2 diabetes mellitus with diabetic peripheral angiopathy without gangrene: Secondary | ICD-10-CM | POA: Diagnosis not present

## 2017-01-02 DIAGNOSIS — I132 Hypertensive heart and chronic kidney disease with heart failure and with stage 5 chronic kidney disease, or end stage renal disease: Secondary | ICD-10-CM | POA: Diagnosis not present

## 2017-01-02 DIAGNOSIS — F329 Major depressive disorder, single episode, unspecified: Secondary | ICD-10-CM | POA: Diagnosis not present

## 2017-01-02 DIAGNOSIS — Z992 Dependence on renal dialysis: Secondary | ICD-10-CM | POA: Diagnosis not present

## 2017-01-02 DIAGNOSIS — Z4781 Encounter for orthopedic aftercare following surgical amputation: Secondary | ICD-10-CM | POA: Diagnosis not present

## 2017-01-02 DIAGNOSIS — I5030 Unspecified diastolic (congestive) heart failure: Secondary | ICD-10-CM | POA: Diagnosis not present

## 2017-01-02 NOTE — Telephone Encounter (Signed)
Home health called and wanted to advise AP patient is noncompliant and BP is elevated - Per AP please contact Primary Care ASAP - never seen in OP setting -advised HH they will call Urgent care is it looks to be only family medicine listed at this point

## 2017-01-07 ENCOUNTER — Inpatient Hospital Stay: Payer: 59 | Admitting: Physical Medicine & Rehabilitation

## 2017-01-08 ENCOUNTER — Encounter: Payer: Medicare Other | Attending: Physical Medicine & Rehabilitation | Admitting: Physical Medicine & Rehabilitation

## 2017-01-09 ENCOUNTER — Ambulatory Visit (INDEPENDENT_AMBULATORY_CARE_PROVIDER_SITE_OTHER): Payer: Medicare Other | Admitting: Orthopedic Surgery

## 2017-01-09 ENCOUNTER — Encounter (INDEPENDENT_AMBULATORY_CARE_PROVIDER_SITE_OTHER): Payer: Self-pay | Admitting: Orthopedic Surgery

## 2017-01-09 VITALS — Ht 69.0 in | Wt 180.0 lb

## 2017-01-09 DIAGNOSIS — S78111A Complete traumatic amputation at level between right hip and knee, initial encounter: Secondary | ICD-10-CM

## 2017-01-09 DIAGNOSIS — Z89611 Acquired absence of right leg above knee: Secondary | ICD-10-CM

## 2017-01-09 NOTE — Progress Notes (Signed)
Office Visit Note   Patient: Hayden [name]           Date of Birth: Nov 10, 1970           MRN: [id number] Visit Date: 01/09/2017              Requested by: Corliss Parish, MD [address] [place], [place] [address] PCP: Corliss Parish, MD  Chief Complaint  Patient presents with  . Right Leg - Routine Post Op    12/04/16 right AKA      HPI: Patient presents in follow-up status post right above-the-knee amputation is possibly 2 months out. He states he does have neuropathic pain at night he can Neurontin 100 mg 3 times a day he is on dialysis. He states he fell on the residual limb last week and popped it back open. He states he had some bloody drainage at that time. Patient is currently wearing a stump shrinker. Obtained from [facility] while in the hospital  Assessment & Plan: Visit Diagnoses:  1. 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 Recommended he take Neurontin 300 mg daily at bedtime to see if this would help with his Phantom pain.  Follow-Up Instructions: Return in about 4 weeks (around 02/06/2017).   Ortho Exam  Patient is alert, oriented, no adenopathy, well-dressed, normal affect, normal respiratory effort. Examination patient remains in a wheelchair. There is a very small open wound over the residual limb on the right there is no drainage no odor no cellulitis no signs of infection the wound is 10 mm in diameter and 1 mm deep. Patient does have massive venous stasis swelling left lower extremity he has blisters dorsally on the foot which he states is from sunburn and swelling. Recommend he wear his compression stockings there is no signs of cellulitis or drainage from the left foot  Imaging: No results found.  Labs: Lab Results  Component Value Date   HGBA1C 5.4 06/20/2015   HGBA1C 9.9 (H) 09/22/2012   HGBA1C (H) 12/22/2008     11.5 (NOTE) The ADA recommends the following therapeutic goal for glycemic control related to Hgb A1c measurement: Goal of therapy: <6.5 Hgb A1c  Reference: American Diabetes Association: Clinical Practice Recommendations 2010, Diabetes Care, 2010, 33: (Suppl  1).   REPTSTATUS 11/14/2016 FINAL 11/09/2016   REPTSTATUS 11/09/2016 FINAL 11/09/2016   GRAMSTAIN NO WBC SEEN NO ORGANISMS SEEN  11/09/2016   CULT NO GROWTH 5 DAYS 11/09/2016    Orders:  No orders of the defined types were placed in this encounter.  No orders of the defined types were placed in this encounter.    Procedures: No procedures performed  Clinical Data: No additional findings.  ROS:  All other systems negative, except as noted in the HPI. Review of Systems  Objective: Vital Signs: Ht 5\' 9"  (1.753 m)   Wt 180 lb (81.6 kg)   BMI 26.58 kg/m   Specialty Comments:  No specialty comments available.  PMFS History: Patient Active Problem List   Diagnosis Date Noted  . Prediabetes   . Phantom limb pain ([place])   . Hypertensive urgency   . Type 2 diabetes mellitus with peripheral neuropathy (HCC)   . Amputee, above knee, right ([place]) 12/09/2016  . Chronic diastolic heart failure ([place])   . Type 2 diabetes mellitus with diabetic peripheral angiopathy without gangrene, without long-term current use of  insulin ([place])   . Unilateral AKA, right ([place])   . ESRD on dialysis ([place])   . 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 Atherosclerosis of native arteries of extremities with gangrene, right leg ([place]) 12/02/2016  . Neuropathic pain   . Hypertensive crisis   . Labile blood pressure   . Type 2 diabetes mellitus with diabetic peripheral angiopathy and gangrene, without long-term current use of insulin ([place])   . Debility 11/22/2016  . Anemia of chronic disease   . Diabetes mellitus type 2 in nonobese (HCC)   . Confusion   . S/P femoral-popliteal bypass surgery   . Tobacco abuse   . Benign  essential HTN   . History of CVA (cerebrovascular accident)   . Post-operative pain   . Acute respiratory failure with hypoxia ([place])   . Agitation 11/04/2016  . Acute encephalopathy 11/04/2016  . Cellulitis of right lower extremity   . Hypokalemia 11/02/2016  . QT prolongation   . Gangrene of right foot ([place])   . Pressure injury of skin 10/24/2016  . Cellulitis of great toe of right foot 10/23/2016  . Leukocytosis 10/23/2016  . Hyperkalemia 10/23/2016  . Abdominal hematoma 11/15/2015  . HCAP (healthcare-associated pneumonia) 11/15/2015  . Acute blood loss anemia 11/15/2015  . Absolute anemia   . Right lower quadrant pain   . Pneumonia involving right lung 11/04/2015  . HTN (hypertension) 11/04/2015  . Noninfectious gastroenteritis and colitis 11/04/2015  . Gastroenteritis 11/04/2015  . CVA (cerebral infarction) 06/19/2015  . End stage renal disease on dialysis ([place]) 06/19/2015  . Lacunar infarct, acute ([place]) 06/19/2015  . GERD (gastroesophageal reflux disease) 06/19/2015  . Anxiety and depression 06/19/2015  . Hypertension, well controlled 06/19/2015  . End stage renal disease ([place]) 01/21/2013  . Acute coronary syndrome ([place]) 09/22/2012  . History of partial Right nephrectomy for renal mass (2008) 09/22/2012  . Left ventricular hypertrophy (moderate) per ECHO 2010 09/22/2012  . Chronic systolic congestive heart failure/EF 40% PER echo 2010 09/22/2012  . 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 Acute kidney injury ([place]) 09/21/2012  . Chest pain 09/21/2012   Past Medical History:  Diagnosis Date  . Anemia March 2014  . Cancer ([place])    Kidney  . CHF (congestive heart failure) ([place]) 1017   Acute systolic and diastolic CHF  . Depression    & rage --  was in counseling....great now  . Diabetes mellitus    NO DM SINCE LOST 130LBS  . ESRD on peritoneal dialysis ([place]) 2018   Started in-center HD approx 2015 for 2 years, then did about 1 year of home HD and then started  peritoneal dialysis in early 2018.    . Gangrene ([place])    right leg and foot  . History of kidney cancer   . Hyperlipidemia   . Hypertension   . Obesity   . PONV (postoperative nausea and vomiting)   . Stroke [facility])     Family History  Problem Relation Age of Onset  . Heart disease Mother        Heart Disease before age 90  . Deep vein thrombosis Father   . Heart attack Father   . Other Other     Past Surgical History:  Procedure Laterality Date  . ABDOMINAL AORTOGRAM W/LOWER EXTREMITY N/A 10/28/2016   Procedure: Abdominal Aortogram w/Lower Extremity;  Surgeon: Angelia Mould, MD;  Location: [facility];  Service: Cardiovascular;  Laterality: N/A;  . AMPUTATION Right  11/05/2016   Procedure: AMPUTATION RIGHT FIRST RAY;  Surgeon: Conrad Nelson, MD;  Location: [facility];  Service: Vascular;  Laterality: Right;  . AMPUTATION Right 12/04/2016   Procedure: RIGHT ABOVE KNEE AMPUTATION;  Surgeon: Newt Minion, MD;  Location: [facility];  Service: Orthopedics;  Laterality: Right;  . AV FISTULA PLACEMENT Left 01/26/2013   Procedure: ARTERIOVENOUS (AV) FISTULA CREATION - LEFT RADIAL CEPHALIC AVF;  Surgeon: Angelia Mould, MD;  Location: [facility];  Service: Vascular;  Laterality: Left;  . CHOLECYSTECTOMY  11/06/2015   Procedure: LAPAROSCOPIC CHOLECYSTECTOMY;  Surgeon: Ralene Ok, MD;  Location: [facility];  Service: General;;  . FEMORAL-POPLITEAL BYPASS GRAFT Right 11/05/2016   Procedure: BYPASS GRAFT FEMORAL-POPLITEAL ARTERY USING NON-REVERSED RIGHT GREATER SAPPHENOUS VEIN;  Surgeon: Conrad Archie, MD;  Location: [facility];  Service: Vascular;  Laterality: Right;  . HERNIA REPAIR    . LOWER EXTREMITY ANGIOGRAM Right 10/30/2016   Procedure: Right  LOWER EXTREMITY ANGIOGRAM WITH RIGHT SUPERFICIAL FEMORAL ARTERY balloon angioplasty;  Surgeon: Conrad Westfield, MD;  Location: [facility];  Service: Vascular;  Laterality: Right;  . NEPHRECTOMY Right 2008   partial  . TESTICLE TORSION REDUCTION    .  TONSILLECTOMY AND ADENOIDECTOMY    . VEIN HARVEST Right 11/05/2016   Procedure: RIGHT GREATER SAPPHENOUS VEIN HARVEST;  Surgeon: Conrad Boalsburg, MD;  Location: [facility];  Service: Vascular;  Laterality: Right;   Social History   Occupational History  . [employer]    Social History Main Topics  . Smoking status: Current Some Day Smoker    Packs/day: 0.25    Years: 10.00    Types: Cigarettes  . Smokeless tobacco: Never Used  . Alcohol use No     Comment: "6 pack occ."-- lasts him six months-STOPPED 2 YRS AGO  . Drug use: Yes    Types: Marijuana     Comment: just in high school   . Sexual activity: Yes    Birth control/ protection: None

## 2017-01-24 ENCOUNTER — Telehealth (INDEPENDENT_AMBULATORY_CARE_PROVIDER_SITE_OTHER): Payer: Self-pay

## 2017-01-24 NOTE — Telephone Encounter (Signed)
Received call from patient on triage phone this afternoon stating that he was currently at dialysis but that he had fallen this morning and landed on his stump. He stated that it did open up and he did have bleeding but it had stopped. Patient said that he was in significant pain. I spoke with Junie Panning, Dr Jess Barters nurse practitioner and she agreed that  I needed to  advise patient that needed to seek treatment in ER if stump had opened up. Patient was advised of this and was agreeable

## 2017-01-29 DIAGNOSIS — I4892 Unspecified atrial flutter: Secondary | ICD-10-CM

## 2017-01-29 HISTORY — DX: Unspecified atrial flutter: I48.92

## 2017-02-06 ENCOUNTER — Ambulatory Visit (INDEPENDENT_AMBULATORY_CARE_PROVIDER_SITE_OTHER): Payer: Medicare Other | Admitting: Orthopedic Surgery

## 2017-02-07 ENCOUNTER — Emergency Department (HOSPITAL_COMMUNITY): Payer: Medicare Other

## 2017-02-07 ENCOUNTER — Inpatient Hospital Stay (HOSPITAL_COMMUNITY)
Admission: EM | Admit: 2017-02-07 | Discharge: 2017-02-17 | DRG: 981 | Disposition: A | Discharge status: Home / Self Care | Payer: Medicare Other | Attending: Internal Medicine | Admitting: Internal Medicine

## 2017-02-07 ENCOUNTER — Encounter (HOSPITAL_COMMUNITY): Payer: Self-pay | Admitting: *Deleted

## 2017-02-07 DIAGNOSIS — R0902 Hypoxemia: Secondary | ICD-10-CM | POA: Diagnosis not present

## 2017-02-07 DIAGNOSIS — E669 Obesity, unspecified: Secondary | ICD-10-CM | POA: Diagnosis present

## 2017-02-07 DIAGNOSIS — R1084 Generalized abdominal pain: Secondary | ICD-10-CM

## 2017-02-07 DIAGNOSIS — L97529 Non-pressure chronic ulcer of other part of left foot with unspecified severity: Secondary | ICD-10-CM | POA: Diagnosis present

## 2017-02-07 DIAGNOSIS — I132 Hypertensive heart and chronic kidney disease with heart failure and with stage 5 chronic kidney disease, or end stage renal disease: Secondary | ICD-10-CM | POA: Diagnosis present

## 2017-02-07 DIAGNOSIS — R112 Nausea with vomiting, unspecified: Secondary | ICD-10-CM | POA: Diagnosis not present

## 2017-02-07 DIAGNOSIS — Z85528 Personal history of other malignant neoplasm of kidney: Secondary | ICD-10-CM

## 2017-02-07 DIAGNOSIS — F32A Depression, unspecified: Secondary | ICD-10-CM

## 2017-02-07 DIAGNOSIS — J9811 Atelectasis: Secondary | ICD-10-CM | POA: Diagnosis not present

## 2017-02-07 DIAGNOSIS — Y841 Kidney dialysis as the cause of abnormal reaction of the patient, or of later complication, without mention of misadventure at the time of the procedure: Secondary | ICD-10-CM | POA: Diagnosis present

## 2017-02-07 DIAGNOSIS — I2510 Atherosclerotic heart disease of native coronary artery without angina pectoris: Secondary | ICD-10-CM | POA: Diagnosis present

## 2017-02-07 DIAGNOSIS — F329 Major depressive disorder, single episode, unspecified: Secondary | ICD-10-CM

## 2017-02-07 DIAGNOSIS — E785 Hyperlipidemia, unspecified: Secondary | ICD-10-CM | POA: Diagnosis present

## 2017-02-07 DIAGNOSIS — R0603 Acute respiratory distress: Secondary | ICD-10-CM | POA: Diagnosis not present

## 2017-02-07 DIAGNOSIS — E872 Acidosis: Secondary | ICD-10-CM

## 2017-02-07 DIAGNOSIS — J9602 Acute respiratory failure with hypercapnia: Secondary | ICD-10-CM

## 2017-02-07 DIAGNOSIS — E1151 Type 2 diabetes mellitus with diabetic peripheral angiopathy without gangrene: Secondary | ICD-10-CM | POA: Diagnosis present

## 2017-02-07 DIAGNOSIS — J9601 Acute respiratory failure with hypoxia: Secondary | ICD-10-CM | POA: Diagnosis not present

## 2017-02-07 DIAGNOSIS — Z992 Dependence on renal dialysis: Secondary | ICD-10-CM | POA: Diagnosis not present

## 2017-02-07 DIAGNOSIS — I509 Heart failure, unspecified: Secondary | ICD-10-CM | POA: Diagnosis not present

## 2017-02-07 DIAGNOSIS — Z9119 Patient's noncompliance with other medical treatment and regimen: Secondary | ICD-10-CM

## 2017-02-07 DIAGNOSIS — N186 End stage renal disease: Secondary | ICD-10-CM | POA: Diagnosis not present

## 2017-02-07 DIAGNOSIS — K659 Peritonitis, unspecified: Secondary | ICD-10-CM | POA: Diagnosis present

## 2017-02-07 DIAGNOSIS — Z9115 Patient's noncompliance with renal dialysis: Secondary | ICD-10-CM

## 2017-02-07 DIAGNOSIS — I4891 Unspecified atrial fibrillation: Secondary | ICD-10-CM | POA: Diagnosis not present

## 2017-02-07 DIAGNOSIS — Z7982 Long term (current) use of aspirin: Secondary | ICD-10-CM

## 2017-02-07 DIAGNOSIS — T8571XA Infection and inflammatory reaction due to peritoneal dialysis catheter, initial encounter: Secondary | ICD-10-CM | POA: Diagnosis not present

## 2017-02-07 DIAGNOSIS — L8990 Pressure ulcer of unspecified site, unspecified stage: Secondary | ICD-10-CM | POA: Diagnosis present

## 2017-02-07 DIAGNOSIS — I361 Nonrheumatic tricuspid (valve) insufficiency: Secondary | ICD-10-CM | POA: Diagnosis not present

## 2017-02-07 DIAGNOSIS — R0602 Shortness of breath: Secondary | ICD-10-CM

## 2017-02-07 DIAGNOSIS — F17210 Nicotine dependence, cigarettes, uncomplicated: Secondary | ICD-10-CM | POA: Diagnosis present

## 2017-02-07 DIAGNOSIS — Z89611 Acquired absence of right leg above knee: Secondary | ICD-10-CM

## 2017-02-07 DIAGNOSIS — R188 Other ascites: Secondary | ICD-10-CM | POA: Diagnosis present

## 2017-02-07 DIAGNOSIS — G4733 Obstructive sleep apnea (adult) (pediatric): Secondary | ICD-10-CM | POA: Diagnosis present

## 2017-02-07 DIAGNOSIS — R109 Unspecified abdominal pain: Secondary | ICD-10-CM | POA: Diagnosis not present

## 2017-02-07 DIAGNOSIS — Z781 Physical restraint status: Secondary | ICD-10-CM

## 2017-02-07 DIAGNOSIS — A419 Sepsis, unspecified organism: Secondary | ICD-10-CM | POA: Diagnosis not present

## 2017-02-07 DIAGNOSIS — E1122 Type 2 diabetes mellitus with diabetic chronic kidney disease: Secondary | ICD-10-CM | POA: Diagnosis present

## 2017-02-07 DIAGNOSIS — F319 Bipolar disorder, unspecified: Secondary | ICD-10-CM | POA: Diagnosis present

## 2017-02-07 DIAGNOSIS — R197 Diarrhea, unspecified: Secondary | ICD-10-CM | POA: Diagnosis not present

## 2017-02-07 DIAGNOSIS — L97521 Non-pressure chronic ulcer of other part of left foot limited to breakdown of skin: Secondary | ICD-10-CM | POA: Diagnosis not present

## 2017-02-07 DIAGNOSIS — I4581 Long QT syndrome: Secondary | ICD-10-CM | POA: Diagnosis present

## 2017-02-07 DIAGNOSIS — N2581 Secondary hyperparathyroidism of renal origin: Secondary | ICD-10-CM | POA: Diagnosis present

## 2017-02-07 DIAGNOSIS — I5043 Acute on chronic combined systolic (congestive) and diastolic (congestive) heart failure: Secondary | ICD-10-CM | POA: Diagnosis not present

## 2017-02-07 DIAGNOSIS — Z8673 Personal history of transient ischemic attack (TIA), and cerebral infarction without residual deficits: Secondary | ICD-10-CM

## 2017-02-07 DIAGNOSIS — Z905 Acquired absence of kidney: Secondary | ICD-10-CM

## 2017-02-07 DIAGNOSIS — G9341 Metabolic encephalopathy: Secondary | ICD-10-CM | POA: Diagnosis not present

## 2017-02-07 DIAGNOSIS — E11621 Type 2 diabetes mellitus with foot ulcer: Secondary | ICD-10-CM | POA: Diagnosis present

## 2017-02-07 DIAGNOSIS — F419 Anxiety disorder, unspecified: Secondary | ICD-10-CM | POA: Diagnosis present

## 2017-02-07 DIAGNOSIS — E11649 Type 2 diabetes mellitus with hypoglycemia without coma: Secondary | ICD-10-CM | POA: Diagnosis present

## 2017-02-07 DIAGNOSIS — I5042 Chronic combined systolic (congestive) and diastolic (congestive) heart failure: Secondary | ICD-10-CM | POA: Diagnosis not present

## 2017-02-07 DIAGNOSIS — D631 Anemia in chronic kidney disease: Secondary | ICD-10-CM | POA: Diagnosis present

## 2017-02-07 DIAGNOSIS — Z79899 Other long term (current) drug therapy: Secondary | ICD-10-CM

## 2017-02-07 DIAGNOSIS — R451 Restlessness and agitation: Secondary | ICD-10-CM | POA: Diagnosis not present

## 2017-02-07 DIAGNOSIS — K219 Gastro-esophageal reflux disease without esophagitis: Secondary | ICD-10-CM | POA: Diagnosis present

## 2017-02-07 DIAGNOSIS — Z6830 Body mass index (BMI) 30.0-30.9, adult: Secondary | ICD-10-CM

## 2017-02-07 DIAGNOSIS — R410 Disorientation, unspecified: Secondary | ICD-10-CM | POA: Diagnosis not present

## 2017-02-07 DIAGNOSIS — I700 Atherosclerosis of aorta: Secondary | ICD-10-CM | POA: Diagnosis not present

## 2017-02-07 LAB — COMPREHENSIVE METABOLIC PANEL
ALK PHOS: 219 U/L — AB (ref 38–126)
ALT: 11 U/L — AB (ref 17–63)
AST: 11 U/L — AB (ref 15–41)
Albumin: 2.8 g/dL — ABNORMAL LOW (ref 3.5–5.0)
Anion gap: 15 (ref 5–15)
BILIRUBIN TOTAL: 0.9 mg/dL (ref 0.3–1.2)
BUN: 71 mg/dL — AB (ref 6–20)
CO2: 23 mmol/L (ref 22–32)
Calcium: 8.5 mg/dL — ABNORMAL LOW (ref 8.9–10.3)
Chloride: 102 mmol/L (ref 101–111)
Creatinine, Ser: 9.01 mg/dL — ABNORMAL HIGH (ref 0.61–1.24)
GFR calc Af Amer: 7 mL/min — ABNORMAL LOW (ref >60)
GFR, EST NON AFRICAN AMERICAN: 6 mL/min — AB (ref >60)
Glucose, Bld: 121 mg/dL — ABNORMAL HIGH (ref 65–99)
Potassium: 4.8 mmol/L (ref 3.5–5.1)
Sodium: 140 mmol/L (ref 135–145)
TOTAL PROTEIN: 6.4 g/dL — AB (ref 6.5–8.1)

## 2017-02-07 LAB — CBC
HCT: 31.4 % — ABNORMAL LOW (ref 39.0–52.0)
Hemoglobin: 9.7 g/dL — ABNORMAL LOW (ref 13.0–17.0)
MCH: 27.1 pg (ref 26.0–34.0)
MCHC: 30.9 g/dL (ref 30.0–36.0)
MCV: 87.7 fL (ref 78.0–100.0)
PLATELETS: 532 10*3/uL — AB (ref 150–400)
RBC: 3.58 MIL/uL — ABNORMAL LOW (ref 4.22–5.81)
RDW: 17.8 % — AB (ref 11.5–15.5)
WBC: 9.4 10*3/uL (ref 4.0–10.5)

## 2017-02-07 LAB — BLOOD GAS, VENOUS

## 2017-02-07 LAB — I-STAT CG4 LACTIC ACID, ED
LACTIC ACID, VENOUS: 3.14 mmol/L — AB (ref 0.5–1.9)
Lactic Acid, Venous: 1.41 mmol/L (ref 0.5–1.9)

## 2017-02-07 LAB — I-STAT VENOUS BLOOD GAS, ED
Bicarbonate: 22.9 mmol/L (ref 20.0–28.0)
O2 SAT: 100 %
TCO2: 24 mmol/L (ref 0–100)
pCO2, Ven: 28.7 mmHg — ABNORMAL LOW (ref 44.0–60.0)
pH, Ven: 7.509 — ABNORMAL HIGH (ref 7.250–7.430)
pO2, Ven: 187 mmHg — ABNORMAL HIGH (ref 32.0–45.0)

## 2017-02-07 LAB — LIPASE, BLOOD: Lipase: 25 U/L (ref 11–51)

## 2017-02-07 LAB — AMMONIA: Ammonia: 38 umol/L — ABNORMAL HIGH (ref 9–35)

## 2017-02-07 MED ORDER — AMLODIPINE BESYLATE 10 MG PO TABS
10.0000 mg | ORAL_TABLET | Freq: Every day | ORAL | Status: DC
  Administered 2017-02-08 – 2017-02-10 (×3): 10 mg via ORAL
  Filled 2017-02-07 (×3): qty 1

## 2017-02-07 MED ORDER — SODIUM CHLORIDE 0.9% FLUSH
3.0000 mL | Freq: Two times a day (BID) | INTRAVENOUS | Status: DC
  Administered 2017-02-07 – 2017-02-16 (×12): 3 mL via INTRAVENOUS

## 2017-02-07 MED ORDER — VANCOMYCIN HCL IN DEXTROSE 750-5 MG/150ML-% IV SOLN
750.0000 mg | Freq: Once | INTRAVENOUS | Status: AC
  Administered 2017-02-07: 750 mg via INTRAVENOUS
  Filled 2017-02-07: qty 150

## 2017-02-07 MED ORDER — HEPARIN SODIUM (PORCINE) 5000 UNIT/ML IJ SOLN
5000.0000 [IU] | Freq: Three times a day (TID) | INTRAMUSCULAR | Status: AC
  Administered 2017-02-07 – 2017-02-10 (×8): 5000 [IU] via SUBCUTANEOUS
  Filled 2017-02-07 (×8): qty 1

## 2017-02-07 MED ORDER — CEFEPIME HCL 1 G IJ SOLR
1.0000 g | Freq: Once | INTRAMUSCULAR | Status: AC
  Administered 2017-02-07: 1 g via INTRAVENOUS
  Filled 2017-02-07: qty 1

## 2017-02-07 MED ORDER — SODIUM CHLORIDE 0.9 % IV BOLUS (SEPSIS)
1000.0000 mL | Freq: Once | INTRAVENOUS | Status: DC
  Administered 2017-02-07: 1000 mL via INTRAVENOUS

## 2017-02-07 MED ORDER — SODIUM CHLORIDE 0.9% FLUSH
3.0000 mL | INTRAVENOUS | Status: DC | PRN
  Administered 2017-02-12: 3 mL via INTRAVENOUS
  Filled 2017-02-07: qty 3

## 2017-02-07 MED ORDER — PRAVASTATIN SODIUM 20 MG PO TABS
20.0000 mg | ORAL_TABLET | Freq: Every day | ORAL | Status: DC
  Administered 2017-02-08 – 2017-02-10 (×3): 20 mg via ORAL
  Filled 2017-02-07 (×3): qty 1

## 2017-02-07 MED ORDER — PIPERACILLIN-TAZOBACTAM 3.375 G IVPB 30 MIN: 3.3750 g | Freq: Once | INTRAVENOUS | Status: DC

## 2017-02-07 MED ORDER — LABETALOL HCL 300 MG PO TABS
300.0000 mg | ORAL_TABLET | Freq: Two times a day (BID) | ORAL | Status: DC
  Administered 2017-02-07 – 2017-02-10 (×6): 300 mg via ORAL
  Filled 2017-02-07 (×8): qty 1

## 2017-02-07 MED ORDER — HYDROCODONE-ACETAMINOPHEN 10-325 MG PO TABS
1.0000 | ORAL_TABLET | Freq: Four times a day (QID) | ORAL | Status: DC | PRN
  Administered 2017-02-07 – 2017-02-08 (×3): 2 via ORAL
  Administered 2017-02-08: 1 via ORAL
  Administered 2017-02-09 – 2017-02-10 (×3): 2 via ORAL
  Filled 2017-02-07: qty 1
  Filled 2017-02-07 (×6): qty 2

## 2017-02-07 MED ORDER — SODIUM CHLORIDE 0.9 % IV SOLN
250.0000 mL | INTRAVENOUS | Status: DC | PRN
  Administered 2017-02-11: 10:00:00 via INTRAVENOUS

## 2017-02-07 MED ORDER — ESCITALOPRAM OXALATE 20 MG PO TABS
20.0000 mg | ORAL_TABLET | Freq: Every day | ORAL | Status: DC
  Administered 2017-02-08 – 2017-02-10 (×3): 20 mg via ORAL
  Filled 2017-02-07 (×3): qty 1

## 2017-02-07 MED ORDER — SODIUM CHLORIDE 0.9% FLUSH
3.0000 mL | Freq: Two times a day (BID) | INTRAVENOUS | Status: DC
  Administered 2017-02-07 – 2017-02-14 (×10): 3 mL via INTRAVENOUS

## 2017-02-07 MED ORDER — LORAZEPAM 0.5 MG PO TABS
0.5000 mg | ORAL_TABLET | Freq: Four times a day (QID) | ORAL | Status: DC | PRN
  Administered 2017-02-07: 0.5 mg via ORAL
  Filled 2017-02-07: qty 1

## 2017-02-07 MED ORDER — CLONIDINE HCL 0.2 MG/24HR TD PTWK
0.2000 mg | MEDICATED_PATCH | TRANSDERMAL | Status: DC
  Administered 2017-02-08 – 2017-02-14 (×2): 0.2 mg via TRANSDERMAL
  Filled 2017-02-07 (×2): qty 1

## 2017-02-07 MED ORDER — IOPAMIDOL (ISOVUE-300) INJECTION 61%
INTRAVENOUS | Status: AC
  Administered 2017-02-07: 100 mL
  Filled 2017-02-07: qty 100

## 2017-02-07 MED ORDER — VANCOMYCIN HCL IN DEXTROSE 1-5 GM/200ML-% IV SOLN
1000.0000 mg | Freq: Once | INTRAVENOUS | Status: DC
  Administered 2017-02-07: 1000 mg via INTRAVENOUS
  Filled 2017-02-07: qty 200

## 2017-02-07 MED ORDER — CINACALCET HCL 30 MG PO TABS
60.0000 mg | ORAL_TABLET | Freq: Every day | ORAL | Status: DC
  Administered 2017-02-09 – 2017-02-16 (×4): 60 mg via ORAL
  Filled 2017-02-07 (×9): qty 2

## 2017-02-07 MED ORDER — ISOSORBIDE DINITRATE 5 MG PO TABS
5.0000 mg | ORAL_TABLET | Freq: Two times a day (BID) | ORAL | Status: DC
  Administered 2017-02-07 – 2017-02-10 (×5): 5 mg via ORAL
  Filled 2017-02-07 (×9): qty 1

## 2017-02-07 MED ORDER — HYDRALAZINE HCL 50 MG PO TABS
100.0000 mg | ORAL_TABLET | Freq: Two times a day (BID) | ORAL | Status: DC
  Administered 2017-02-07 – 2017-02-10 (×5): 100 mg via ORAL
  Filled 2017-02-07 (×6): qty 2

## 2017-02-07 MED ORDER — DIVALPROEX SODIUM 125 MG PO CSDR
500.0000 mg | DELAYED_RELEASE_CAPSULE | Freq: Two times a day (BID) | ORAL | Status: DC
  Administered 2017-02-07 – 2017-02-10 (×6): 500 mg via ORAL
  Filled 2017-02-07 (×7): qty 4

## 2017-02-07 NOTE — ED Notes (Signed)
Received a call from pharmacy to keep antibiotics running and they will be adding more vancomycin to be infused.

## 2017-02-07 NOTE — Progress Notes (Addendum)
Pharmacy Antibiotic Note  Hayden [name] is a 46 y.o. male admitted on 02/07/2017 with concern for peritonitis.  Pharmacy has been consulted for vancomycin/cefepime dosing. Afebrile, WBC wnl, LA up 3.14. Noted, patient recently transitioned from PD to HD with no issues so far, last HD 8/8, per EDP note.  Patient received vancomycin 1g IV x 1 at 1635 in the ED. Estimated weight ~180lbs per RN in the ED. RN not aware of pt being on antibiotics PTA.  Plan: Cefepime 1g IV x 1; then 2g IV qHD days at 1800 Give vancomycin additional 750mg  IV x 1 to equal total 1750mg  load; then 1g IV qHD Monitor clinical progress, c/s, abx plan/LOT Pre-HD vancomycin level as indicated F/u HD schedule/tolerance inpatient to enter antibiotic maintenance doses    Temp (24hrs), Avg:98 F (36.7 C), Min:98 F (36.7 C), Max:98 F (36.7 C)   Recent Labs Lab 02/07/17 1208 02/07/17 1223 02/07/17 1438  WBC 9.4  --   --   CREATININE 9.01*  --   --   LATICACIDVEN  --  1.41 3.14*    CrCl cannot be calculated (Unknown ideal weight.).    Allergies  Allergen Reactions  . Dilaudid [Hydromorphone Hcl] Other (See Comments)    ABNORMAL BEHAVIORS "VERBALLY AND PHYSICALLY ABUSIVE" PSYCHOSIS  . Morphine And Related Itching and Other (See Comments)    Antimicrobials this admission: 8/10 vancomycin >>  8/10 cefepime >>   Dose adjustments this admission:   Microbiology results:   Elicia Lamp, PharmD, BCPS Clinical Pharmacist 02/07/2017 4:53 PM

## 2017-02-07 NOTE — ED Triage Notes (Signed)
Pt hx of peritoneal dialysis, recent switched back to HD. Last tx was wed. reprots having severe abd pain with n/v/d.

## 2017-02-07 NOTE — H&P (Signed)
Date: 02/07/2017               Patient Name:  Hayden [name] MRN: [id number]  DOB: 1970-10-20 Age / Sex: 46 y.o., male   PCP: Corliss Parish, MD         Medical Service: Internal Medicine Teaching Service         Attending Physician: Dr. Annia Belt, MD    First Contact: Dr. Colbert Ewing Pager: [phone]  Second Contact: Dr. Maryellen Pile Pager: [phone]       After Hours (After 5p/  First Contact Pager: [phone]  weekends / holidays): Second Contact Pager: [phone]   Chief Complaint: abdominal pain for the last 3 weeks  History of Present Illness: Hayden [name] is a 46yo male with PMH significant for ESRD on MWF HD (was on peritoneal dialysis for a period of time a few months ago), kidney cancer s/p partial right nephrectomy, T2DM s/p R AKA, CHF (last echo 10/2016 with EF 60-65%), obesity, HTN, and HLD who presents with 3 weeks of worsening diffuse abdominal pain, associated with N/V and diarrhea. History was obtained from patient and son. Denies hematemesis or hematochezia. He describes the pain as "someone grabbing me from the inside and twisting". The pain is severe and does not radiate to his back. It is severe enough that he reports he is not able to sleep because of the pain and has been very somnolent during the day for the last few days. Going from lying to sitting or standing to lying makes the pain worse. Denies fevers, chills, chest pain, or trouble breathing.  Of note, patient switched from peritoneal dialysis to HD a few months ago. He still has his peritoneal dialysis catheter and son notes that it has not been flushed or cared for since he switched to HD. Son states that "no one knows what to do with it". According to nephrology, he was supposed to have the PD catheter removed yesterday.  In the ER, BP 134/64, HR 77, temp 98, O2 95% on RA. WBC 9.4. Lactic acid 1.4 -> 3.1. Lipase normal. He was very somnolent on my interview. He wakes up when asked questions,  but falls back asleep. CT abd/pelvis with no acute abnormality but extensive calcific aortic and coronary atherosclerosis, body wall edema, and peritoneal dialysis catheter in place with associated small volume of abdominal and pelvic fluid. He was started on empiric vanc/cefepime.  Meds:  No current facility-administered medications on file prior to encounter.    Current Outpatient Prescriptions on File Prior to Encounter  Medication Sig Dispense Refill  . amLODipine (NORVASC) 10 MG tablet Take 1 tablet (10 mg total) by mouth daily. 30 tablet 0  . aspirin EC 325 MG EC tablet Take 1 tablet (325 mg total) by mouth daily. 30 tablet 0  . calcitRIOL (ROCALTROL) 0.5 MCG capsule Take 1 capsule (0.5 mcg total) by mouth daily. 30 capsule 0  . cinacalcet (SENSIPAR) 60 MG tablet Take 1 tablet (60 mg total) by mouth daily. 30 tablet 0  . cloNIDine (CATAPRES - DOSED IN MG/24 HR) 0.2 mg/24hr patch Place 1 patch (0.2 mg total) onto the skin once a week. 4 patch 12  . divalproex (DEPAKOTE SPRINKLE) 125 MG capsule Take 4 capsules (500 mg total) by mouth every 12 (twelve) hours. 60 capsule 0  . escitalopram (LEXAPRO) 10 MG tablet Take 1 tablet (10 mg total) by mouth daily. 30 tablet 0  . ferric citrate (AURYXIA) 1 GM 210 MG(Fe)  tablet Take 2 tablets (420 mg total) by mouth 3 (three) times daily with meals. 270 tablet 0  . gabapentin (NEURONTIN) 100 MG capsule Take 1 capsule (100 mg total) by mouth 3 (three) times daily. 90 capsule 0  . hydrALAZINE (APRESOLINE) 100 MG tablet Take 1 tablet (100 mg total) by mouth every 8 (eight) hours. 90 tablet 0  . HYDROcodone-acetaminophen (NORCO) 10-325 MG tablet Take 1-2 tablets by mouth every 6 (six) hours as needed (breakthrough pain). 30 tablet 0  . isosorbide dinitrate (ISORDIL) 5 MG tablet Take 1 tablet (5 mg total) by mouth 2 (two) times daily. 60 tablet 0  . labetalol (NORMODYNE) 300 MG tablet Take 1 tablet (300 mg total) by mouth 2 (two) times daily. 60 tablet 0  .  LORazepam (ATIVAN) 1 MG tablet Take 1 tablet (1 mg total) by mouth every 6 (six) hours as needed for anxiety or sleep. 20 tablet 0  . methocarbamol (ROBAXIN) 500 MG tablet Take 1 tablet (500 mg total) by mouth every 6 (six) hours as needed for muscle spasms. 60 tablet 0  . multivitamin (RENA-VIT) TABS tablet Take 1 tablet by mouth at bedtime. 30 tablet 0  . omeprazole (PRILOSEC) 40 MG capsule Take 1 capsule (40 mg total) by mouth daily. 30 capsule 0  . pravastatin (PRAVACHOL) 20 MG tablet Take 1 tablet (20 mg total) by mouth daily. 30 tablet 0  . Vitamin D, Ergocalciferol, (DRISDOL) 50000 units CAPS capsule Take 50,000 Units by mouth every Thursday.      Allergies: Allergies as of 02/07/2017 - Review Complete 02/07/2017  Allergen Reaction Noted  . Dilaudid [hydromorphone hcl] Other (See Comments) 02/10/2012  . Morphine and related Itching and Other (See Comments) 02/10/2012   Past Medical History:  Diagnosis Date  . Anemia March 2014  . Cancer ([place])    Kidney  . CHF (congestive heart failure) ([place]) 7414   Acute systolic and diastolic CHF  . Depression    & rage --  was in counseling....great now  . Diabetes mellitus    NO DM SINCE LOST 130LBS  . ESRD on peritoneal dialysis ([place]) 2018   Started in-center HD approx 2015 for 2 years, then did about 1 year of home HD and then started peritoneal dialysis in early 2018.    . Gangrene ([place])    right leg and foot  . History of kidney cancer   . Hyperlipidemia   . Hypertension   . Obesity   . PONV (postoperative nausea and vomiting)   . Stroke [facility])    Family History: Family History  Problem Relation Age of Onset  . Heart disease Mother        Heart Disease before age 50  . Deep vein thrombosis Father   . Heart attack Father   . Other Other    Social History: - denies smoking, alcohol use, or other illicit drugs - lives at home with wife and son  Review of Systems: A complete ROS was negative except as per HPI.  Physical  Exam: Blood pressure (!) 160/78, pulse 76, temperature 98 F (36.7 C), temperature source Oral, resp. rate 20, SpO2 95 %. GEN: Well-nourished, obese white male sleeping comfortably in bed. Very somnolent on exam. Oriented to place and current president, Not oriented to time. Sats dropping to 80s while sleeping. HEENT: [place]/AT. EOMI. Sclera anicteric. RESP: Clear to auscultation bilaterally. No wheezes or rales appreciated. CV: Normal rate and regular rhythm. Holosystolic murmur, best heard at the RUSB. 1+ LLE  edema. ABD: Soft. Non-distended. Diffusely tender to palpation. Normoactive bowel sounds. PD catheter exit site covered by dry dressing. EXT: 1+ LLE edema. R AKA. Warm. 1+ DP pulse in LLE. Palpable thrill in L AVF NEURO: Cranial nerves II-XII grossly intact. Oriented to place and current president, Not oriented to time.  Labs: CMP Latest Ref Rng & Units 02/07/2017 12/13/2016 12/12/2016  Glucose 65 - 99 mg/dL 121(H) 114(H) 103(H)  BUN 6 - 20 mg/dL 71(H) 42(H) 23(H)  Creatinine 0.61 - 1.24 mg/dL 9.01(H) 5.36(H) 3.89(H)  Sodium 135 - 145 mmol/L 140 137 134(L)  Potassium 3.5 - 5.1 mmol/L 4.8 3.9 3.8  Chloride 101 - 111 mmol/L 102 102 99(L)  CO2 22 - 32 mmol/L 23 24 27   Calcium 8.9 - 10.3 mg/dL 8.5(L) 7.5(L) 7.5(L)  Total Protein 6.5 - 8.1 g/dL 6.4(L) - -  Total Bilirubin 0.3 - 1.2 mg/dL 0.9 - -  Alkaline Phos 38 - 126 U/L 219(H) - -  AST 15 - 41 U/L 11(L) - -  ALT 17 - 63 U/L 11(L) - -   CBC    Component Value Date/Time   WBC 9.4 02/07/2017 1208   RBC 3.58 (L) 02/07/2017 1208   HGB 9.7 (L) 02/07/2017 1208   HCT 31.4 (L) 02/07/2017 1208   PLT 532 (H) 02/07/2017 1208   MCV 87.7 02/07/2017 1208   MCH 27.1 02/07/2017 1208   MCHC 30.9 02/07/2017 1208   RDW 17.8 (H) 02/07/2017 1208   LYMPHSABS 1.9 11/28/2016 0547   MONOABS 1.7 (H) 11/28/2016 0547   EOSABS 0.9 (H) 11/28/2016 0547   BASOSABS 0.2 (H) 11/28/2016 0547   Lactic acid 1.41 -> 3.14 Lipase 25 VBG 7.51/28/187 Ammonia  38  CT abd/pelvis: 1. No acute abnormality abdomen or pelvis. No finding to explain the patient's symptoms. 2. Cardiomegaly. 3. Extensive calcific aortic and coronary atherosclerosis. Calcification of the mitral annulus and aortic valve also noted. 4. Body wall edema compatible with volume overload. 5. Peritoneal dialysis catheter in place with associated small volume of abdominal and pelvic fluid.  Assessment & Plan by Problem: Active Problems:   Abdominal pain  Mr. Batalla is a 46yo male with PMH significant for ESRD on MWF HD (was on peritoneal dialysis for a period of time a few months ago), kidney cancer s/p partial right nephrectomy, history of T2DM s/p R AKA, CHF (last echo 10/2016 with EF 60-65%), obesity, HTN, and HLD who presents with 3 weeks of worsening diffuse abdominal pain, concerning for infection vs mesenteric ischemia.  # Abdominal Pain Etiology: Peritonitis from peritoneal catheter vs Mesenteric ischemia Patient with worsening severe abdominal pain associated with N/V/D. No leukocytosis and afebrile. However, patient does have a peritoneal dialysis catheter that has not been cared for for the last month after switching from PD to HD, concerning for peritonitis. CT A/P also demonstrates extensive atherosclerosis, which may increase concern for mesenteric ischemia. However, patient's clinical history (abdominal pain not necessarily worse with eating and pain not out of proportion to exam) makes this less likely. - Empiric vancomycin/cefepime - CBC and BMP tomorrow morning - Monitor fever curve - BCx x2 - UA - NPO - PT/OT eval  # ESRD on HD MWF Patient recently switched from PD to HD. Still has PD catheter in place that has not been cared for. - Nephrology consulted --- will get him on HD schedule inpatient --- will flush and care for PD catheter --- will inform General Surgery about getting PD catheter removed --- do not want cultures  or fluid studies because the  catheter will be removed soon - Continue calcitriol, ferric citrate, cinacalcet - BMP tomorrow morning  # CHF (last echo 10/2016 with EF 60-65%) - continue home labetalol, isosorbide dinitrate, and aspirin  # HTN BP 130s-160s/60s-70s - monitor BP - continue home clonidine, labetalol, amlodipine, and hydralazine  # probable OSA Patient snoring and desaturating to mid 80s while sleeping. Has daytime somnolence. - CPAP  # HLD - continue home pravastatin  # Depression - cont home depakote, escitalopram, ativan PRN  VTE PPx: SQ[facility] Dispo: Admit patient to Inpatient with expected length of stay greater than 2 midnights.  Signed: Colbert Ewing, MD  Internal Medicine, PGY-1 02/07/2017, 4:58 PM  P [phone]

## 2017-02-07 NOTE — ED Provider Notes (Signed)
[facility] DEPT Provider Note   CSN: [id number] Arrival date & time: 02/07/17  1050     History   Chief Complaint Chief Complaint  Patient presents with  . Abdominal Pain  . Emesis    HPI Hayden [name] is a 46 y.o. male.  46 yo M with a chief complaint of diffuse abdominal pain. Going on for the past week or so. Associated with some somnolence as well. Nothing seems to make this better or worse. The patient was recently transitioned from peritoneal dialysis to HD. Having no issues with that so far. Last dialysis was on Wednesday. Having some subjective fevers and chills. Nausea but denies vomiting. Still urinates but only about once a day.   The history is provided by the patient and a relative.  Abdominal Pain   This is a new problem. The current episode started more than 2 days ago. The problem occurs constantly. The problem has not changed since onset.The pain is located in the generalized abdominal region. The quality of the pain is sharp and shooting. The pain is at a severity of 10/10. The pain is severe. Associated symptoms include nausea. Pertinent negatives include fever, diarrhea, vomiting, headaches, arthralgias and myalgias. Nothing aggravates the symptoms. Nothing relieves the symptoms.    Past Medical History:  Diagnosis Date  . Anemia March 2014  . Cancer ([place])    Kidney  . CHF (congestive heart failure) ([place]) 3235   Acute systolic and diastolic CHF  . Depression    & rage --  was in counseling....great now  . Diabetes mellitus    NO DM SINCE LOST 130LBS  . 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    . Gangrene ([place])    right leg and foot  . History of kidney cancer   . Hyperlipidemia   . Hypertension   . Obesity   . PONV (postoperative nausea and vomiting)   . Stroke [facility])     Patient Active Problem List   Diagnosis Date Noted  . Prediabetes   . Phantom limb pain ([place])   . Hypertensive urgency   . Type 2 diabetes mellitus with peripheral neuropathy (HCC)   . 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 Chronic diastolic heart failure ([place])   . Type 2 diabetes mellitus with diabetic peripheral angiopathy without gangrene, without long-term current use of insulin ([place])   . Unilateral AKA, right ([place])   . ESRD on dialysis ([place])   . 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 Neuropathic pain   . Hypertensive crisis   . Labile blood pressure   . Type 2 diabetes mellitus with diabetic peripheral angiopathy and gangrene, without long-term current use of insulin ([place])   . Debility 11/22/2016  . Anemia of chronic disease   . Diabetes mellitus type 2 in nonobese (HCC)   . Confusion   . S/P femoral-popliteal bypass surgery   . Tobacco abuse   . Benign essential HTN   . History of CVA (cerebrovascular accident)   . Post-operative pain   . Acute respiratory failure with hypoxia ([place])   . Agitation 11/04/2016  . Acute encephalopathy 11/04/2016  . Cellulitis of right lower extremity   . Hypokalemia 11/02/2016  . QT prolongation   .  Gangrene of right foot ([place])   . Pressure injury of skin 10/24/2016  . Cellulitis of great toe of right foot 10/23/2016  . Leukocytosis 10/23/2016  . Hyperkalemia 10/23/2016  . Abdominal hematoma 11/15/2015  . HCAP (healthcare-associated pneumonia) 11/15/2015  . Acute blood loss anemia 11/15/2015  . Absolute anemia   . Right lower quadrant pain   . Pneumonia involving right lung 11/04/2015  . HTN (hypertension) 11/04/2015  . Noninfectious gastroenteritis and colitis 11/04/2015  . Gastroenteritis 11/04/2015  . CVA (cerebral infarction) 06/19/2015  . End stage renal disease on dialysis ([place]) 06/19/2015  . Lacunar infarct, acute ([place])  06/19/2015  . GERD (gastroesophageal reflux disease) 06/19/2015  . Anxiety and depression 06/19/2015  . Hypertension, well controlled 06/19/2015  . End stage renal disease ([place]) 01/21/2013  . Acute coronary syndrome ([place]) 09/22/2012  . History of partial Right nephrectomy for renal mass (2008) 09/22/2012  . Left ventricular hypertrophy (moderate) per ECHO 2010 09/22/2012  . Chronic systolic congestive heart failure/EF 40% PER echo 2010 09/22/2012  . 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 Acute kidney injury ([place]) 09/21/2012  . Chest pain 09/21/2012    Past Surgical History:  Procedure Laterality Date  . ABDOMINAL AORTOGRAM W/LOWER EXTREMITY N/A 10/28/2016   Procedure: Abdominal Aortogram w/Lower Extremity;  Surgeon: Angelia Mould, MD;  Location: [facility];  Service: Cardiovascular;  Laterality: N/A;  . AMPUTATION Right 11/05/2016   Procedure: AMPUTATION RIGHT FIRST RAY;  Surgeon: Conrad [place], MD;  Location: [facility];  Service: Vascular;  Laterality: Right;  . AMPUTATION Right 12/04/2016   Procedure: RIGHT ABOVE KNEE AMPUTATION;  Surgeon: Newt Minion, MD;  Location: [facility];  Service: Orthopedics;  Laterality: Right;  . AV FISTULA PLACEMENT Left 01/26/2013   Procedure: ARTERIOVENOUS (AV) FISTULA CREATION - LEFT RADIAL CEPHALIC AVF;  Surgeon: Angelia Mould, MD;  Location: [facility];  Service: Vascular;  Laterality: Left;  . CHOLECYSTECTOMY  11/06/2015   Procedure: LAPAROSCOPIC CHOLECYSTECTOMY;  Surgeon: Ralene Ok, MD;  Location: [facility];  Service: General;;  . FEMORAL-POPLITEAL BYPASS GRAFT Right 11/05/2016   Procedure: BYPASS GRAFT FEMORAL-POPLITEAL ARTERY USING NON-REVERSED RIGHT GREATER SAPPHENOUS VEIN;  Surgeon: Conrad [place], MD;  Location: [facility];  Service: Vascular;  Laterality: Right;  . HERNIA REPAIR    . LOWER EXTREMITY ANGIOGRAM Right 10/30/2016   Procedure: Right  LOWER EXTREMITY ANGIOGRAM WITH RIGHT SUPERFICIAL FEMORAL ARTERY balloon angioplasty;  Surgeon:  Conrad [place], MD;  Location: [facility];  Service: Vascular;  Laterality: Right;  . NEPHRECTOMY Right 2008   partial  . TESTICLE TORSION REDUCTION    . TONSILLECTOMY AND ADENOIDECTOMY    . VEIN HARVEST Right 11/05/2016   Procedure: RIGHT GREATER SAPPHENOUS VEIN HARVEST;  Surgeon: Conrad [place], MD;  Location: [facility];  Service: Vascular;  Laterality: Right;       Home Medications    Prior to Admission medications   Medication Sig Start Date End Date Taking? Authorizing Provider  amLODipine (NORVASC) 10 MG tablet Take 1 tablet (10 mg total) by mouth daily. 12/13/16   Angiulli, Lavon Paganini, PA-C  aspirin EC 325 MG EC tablet Take 1 tablet (325 mg total) by mouth daily. 12/13/16   Angiulli, Lavon Paganini, PA-C  calcitRIOL (ROCALTROL) 0.5 MCG capsule Take 1 capsule (0.5 mcg total) by mouth daily. 12/13/16   Angiulli, Lavon Paganini, PA-C  cinacalcet (SENSIPAR) 60 MG tablet Take 1 tablet (60 mg total) by mouth daily. 12/13/16   Angiulli, Lavon Paganini, PA-C  cloNIDine (  CATAPRES - DOSED IN MG/24 HR) 0.2 mg/24hr patch Place 1 patch (0.2 mg total) onto the skin once a week. 12/13/16   Angiulli, Lavon Paganini, PA-C  divalproex (DEPAKOTE SPRINKLE) 125 MG capsule Take 4 capsules (500 mg total) by mouth every 12 (twelve) hours. 12/13/16   Angiulli, Lavon Paganini, PA-C  escitalopram (LEXAPRO) 10 MG tablet Take 1 tablet (10 mg total) by mouth daily. 12/13/16   Angiulli, Lavon Paganini, PA-C  ferric citrate (AURYXIA) 1 GM 210 MG(Fe) tablet Take 2 tablets (420 mg total) by mouth 3 (three) times daily with meals. 12/13/16   Angiulli, Lavon Paganini, PA-C  gabapentin (NEURONTIN) 100 MG capsule Take 1 capsule (100 mg total) by mouth 3 (three) times daily. 12/13/16   Angiulli, Lavon Paganini, PA-C  hydrALAZINE (APRESOLINE) 100 MG tablet Take 1 tablet (100 mg total) by mouth every 8 (eight) hours. 12/13/16   Angiulli, Lavon Paganini, PA-C  HYDROcodone-acetaminophen (NORCO) 10-325 MG tablet Take 1-2 tablets by mouth every 6 (six) hours as needed (breakthrough pain). 12/24/16   Newt Minion, MD  isosorbide dinitrate (ISORDIL) 5 MG tablet Take 1 tablet (5 mg total) by mouth 2 (two) times daily. 12/13/16   Angiulli, Lavon Paganini, PA-C  labetalol (NORMODYNE) 300 MG tablet Take 1 tablet (300 mg total) by mouth 2 (two) times daily. 12/13/16   Angiulli, Lavon Paganini, PA-C  LORazepam (ATIVAN) 1 MG tablet Take 1 tablet (1 mg total) by mouth every 6 (six) hours as needed for anxiety or sleep. 12/13/16   Angiulli, Lavon Paganini, PA-C  methocarbamol (ROBAXIN) 500 MG tablet Take 1 tablet (500 mg total) by mouth every 6 (six) hours as needed for muscle spasms. 12/13/16   Angiulli, Lavon Paganini, PA-C  multivitamin (RENA-VIT) TABS tablet Take 1 tablet by mouth at bedtime. 12/13/16   Angiulli, Lavon Paganini, PA-C  omeprazole (PRILOSEC) 40 MG capsule Take 1 capsule (40 mg total) by mouth daily. 12/13/16   Angiulli, Lavon Paganini, PA-C  pravastatin (PRAVACHOL) 20 MG tablet Take 1 tablet (20 mg total) by mouth daily. 12/13/16   Angiulli, Lavon Paganini, PA-C  Vitamin D, Ergocalciferol, (DRISDOL) 50000 units CAPS capsule Take 50,000 Units by mouth every Thursday.     [provider]    Family History Family History  Problem Relation Age of Onset  . Heart disease Mother        Heart Disease before age 75  . Deep vein thrombosis Father   . Heart attack Father   . Other Other     Social History Social History  Substance Use Topics  . Smoking status: Current Some Day Smoker    Packs/day: 0.25    Years: 10.00    Types: Cigarettes  . Smokeless tobacco: Never Used  . Alcohol use No     Comment: "6 pack occ."-- lasts him six months-STOPPED 2 YRS AGO     Allergies   Dilaudid [hydromorphone hcl] and Morphine and related   Review of Systems Review of Systems  Constitutional: Negative for chills and fever.  HENT: Negative for congestion and facial swelling.   Eyes: Negative for discharge and visual disturbance.  Respiratory: Negative for shortness of breath.   Cardiovascular: Negative for chest pain and  palpitations.  Gastrointestinal: Positive for abdominal pain and nausea. Negative for diarrhea and vomiting.  Musculoskeletal: Negative for arthralgias and myalgias.  Skin: Negative for color change and rash.  Neurological: Negative for tremors, syncope and headaches.  Psychiatric/Behavioral: Negative for confusion and dysphoric mood.  Physical Exam Updated Vital Signs BP (!) 146/72 (BP Location: Right Arm)   Pulse 76   Temp 98 F (36.7 C) (Oral)   Resp 18   SpO2 94%   Physical Exam  Constitutional: He is oriented to person, place, and time. He appears well-developed and well-nourished.  HENT:  Head: Normocephalic and atraumatic.  Eyes: Pupils are equal, round, and reactive to light. EOM are normal.  Neck: Normal range of motion. Neck supple. No JVD present.  Cardiovascular: Normal rate and regular rhythm.  Exam reveals no gallop and no friction rub.   No murmur heard. Pulmonary/Chest: No respiratory distress. He has no wheezes.  Abdominal: He exhibits no distension and no mass. There is tenderness (diffuse). There is no rebound and no guarding.  PD cath in place, no noted erythema warmth or drainage  Musculoskeletal: Normal range of motion.  L av fistula with palpable thrill  Neurological: He is alert and oriented to person, place, and time.  Skin: No rash noted. No pallor.  Psychiatric: He has a normal mood and affect. His behavior is normal.  Nursing note and vitals reviewed.    ED Treatments / Results  Labs (all labs ordered are listed, but only abnormal results are displayed) Labs Reviewed  COMPREHENSIVE METABOLIC PANEL - Abnormal; Notable for the following:       Result Value   Glucose, Bld 121 (*)    BUN 71 (*)    Creatinine, Ser 9.01 (*)    Calcium 8.5 (*)    Total Protein 6.4 (*)    Albumin 2.8 (*)    AST 11 (*)    ALT 11 (*)    Alkaline Phosphatase 219 (*)    GFR calc non Af Amer 6 (*)    GFR calc Af Amer 7 (*)    All other components within normal  limits  CBC - Abnormal; Notable for the following:    RBC 3.58 (*)    Hemoglobin 9.7 (*)    HCT 31.4 (*)    RDW 17.8 (*)    Platelets 532 (*)    All other components within normal limits  I-STAT CG4 LACTIC ACID, ED - Abnormal; Notable for the following:    Lactic Acid, Venous 3.14 (*)    All other components within normal limits  BODY FLUID CULTURE  CULTURE, BLOOD (ROUTINE X 2)  CULTURE, BLOOD (ROUTINE X 2)  LIPASE, BLOOD  URINALYSIS, ROUTINE W REFLEX MICROSCOPIC  LACTATE DEHYDROGENASE, PLEURAL OR PERITONEAL FLUID  GLUCOSE, PLEURAL OR PERITONEAL FLUID  PROTEIN, PLEURAL OR PERITONEAL FLUID  ALBUMIN, PLEURAL OR PERITONEAL FLUID  BLOOD GAS, VENOUS  AMMONIA  I-STAT CG4 LACTIC ACID, ED    EKG  EKG Interpretation None       Radiology Ct Abdomen Pelvis W Contrast  Result Date: 02/07/2017 CLINICAL DATA:  Severe lower abdominal pain with nausea, vomiting and diarrhea for 3 weeks. Peritoneal dialysis patient. EXAM: CT ABDOMEN AND PELVIS WITH CONTRAST TECHNIQUE: Multidetector CT imaging of the abdomen and pelvis was performed using the standard protocol following bolus administration of intravenous contrast. CONTRAST:  100 ml ISOVUE-300 IOPAMIDOL (ISOVUE-300) INJECTION 61% COMPARISON:  CT chest, abdomen and pelvis 11/15/2015. FINDINGS: Lower chest: There is cardiomegaly. Extensive calcific coronary artery disease is identified. There is calcification of the mitral annulus and aortic valve. Dependent bibasilar atelectasis noted. No pericardial effusion. Hepatobiliary: Acute punctate calcifications are seen the liver likely vascular. The liver is otherwise normal appearance. The patient is status post cholecystectomy. Small calcifications along the  gallbladder fossa are due to hematoma seen on the prior CT scan. The hematoma has resolved. Biliary tree is unremarkable. Pancreas: Unremarkable. No pancreatic ductal dilatation or surrounding inflammatory changes. Spleen: Normal in size without  focal abnormality. Adrenals/Urinary Tract: The kidneys are atrophic. No focal lesion. Only small volume of urine is seen in the urinary bladder. Adrenal glands appear normal. Stomach/Bowel: Stomach is within normal limits. Appendix appears normal. No evidence of bowel wall thickening, distention or inflammatory changes. Vascular/Lymphatic: Extensive atherosclerosis is present. No aortic or iliac artery aneurysm. No lymphadenopathy. Reproductive: Prostate is unremarkable. Other: Peritoneal dialysis catheter in the pelvis is noted. Small volume of abdominal and pelvic fluid is consistent with peritoneal dialysis. Body wall edema is noted. Musculoskeletal: No fracture or worrisome lesion. Broad-based disc bulge with calcification of the annulus L5-S1 noted. IMPRESSION: No acute abnormality abdomen or pelvis. No finding to explain the patient's symptoms. Cardiomegaly. Extensive calcific aortic and coronary atherosclerosis. Calcification of the mitral annulus and aortic valve also noted. Body wall edema compatible with volume overload. Peritoneal dialysis catheter in place with associated small volume of abdominal and pelvic fluid. Electronically Signed   By: Inge Rise M.D.   On: 02/07/2017 15:43    Procedures Procedures (including critical care time)  Medications Ordered in ED Medications  vancomycin (VANCOCIN) IVPB 1000 mg/200 mL premix (not administered)  piperacillin-tazobactam (ZOSYN) IVPB 3.375 g (not administered)  sodium chloride 0.9 % bolus 1,000 mL (not administered)  sodium chloride 0.9 % bolus 1,000 mL (not administered)  iopamidol (ISOVUE-300) 61 % injection (100 mLs  Contrast Given 02/07/17 1522)     Initial Impression / Assessment and Plan / ED Course  I have reviewed the triage vital signs and the nursing notes.  Pertinent labs & imaging results that were available during my care of the patient were reviewed by me and considered in my medical decision making (see chart for  details).     46 yo M With a chief complaints of diffuse abdominal pain. This been going on for the past week or so. Also been very sleepy with it. Family feels that he is having trouble sleeping at night due to pain. On my exam the patient is able to awaken have a short conversation with me. Abdominal pain diffusely on exam. CT scan with no specific finding. As the patient has recently stopped peritoneal dialysis my concern is for SBP. Start on broad-spectrum antibiotics. Has an escalating lactic acid. Admit.  CRITICAL CARE Performed by: Cecilio Asper   Total critical care time: 35 minutes  Critical care time was exclusive of separately billable procedures and treating other patients.  Critical care was necessary to treat or prevent imminent or life-threatening deterioration.  Critical care was time spent personally by me on the following activities: development of treatment plan with patient and/or surrogate as well as nursing, discussions with consultants, evaluation of patient's response to treatment, examination of patient, obtaining history from patient or surrogate, ordering and performing treatments and interventions, ordering and review of laboratory studies, ordering and review of radiographic studies, pulse oximetry and re-evaluation of patient's condition.   The patients results and plan were reviewed and discussed.   Any x-rays performed were independently reviewed by myself.   Differential diagnosis were considered with the presenting HPI.  Medications  vancomycin (VANCOCIN) IVPB 1000 mg/200 mL premix (not administered)  piperacillin-tazobactam (ZOSYN) IVPB 3.375 g (not administered)  sodium chloride 0.9 % bolus 1,000 mL (not administered)  sodium chloride 0.9 % bolus 1,000  mL (not administered)  iopamidol (ISOVUE-300) 61 % injection (100 mLs  Contrast Given 02/07/17 1522)    Vitals:   02/07/17 1154 02/07/17 1437  BP: 134/64 (!) 146/72  Pulse: 77 76  Resp: 20  18  Temp: 98 F (36.7 C)   TempSrc: Oral   SpO2: 95% 94%    Final diagnoses:  Generalized abdominal pain  Sepsis, due to unspecified organism [facility])    Admission/ observation were discussed with the admitting physician, patient and/or family and they are comfortable with the plan.    Final Clinical Impressions(s) / ED Diagnoses   Final diagnoses:  Generalized abdominal pain  Sepsis, due to unspecified organism [facility])    New Prescriptions New Prescriptions   No medications on file     Deno Etienne, DO 02/07/17 1602

## 2017-02-07 NOTE — ED Notes (Signed)
Patient transported to CT 

## 2017-02-07 NOTE — ED Notes (Signed)
Attempted to collect urine sample in lobby. Pt produces urine, but unable to provide at this time

## 2017-02-07 NOTE — ED Notes (Signed)
Pt. Aware we need urine sample. He said he is unable to go at this time. Urinal at bedside

## 2017-02-07 NOTE — Plan of Care (Signed)
Problem: Education: Goal: Knowledge of [facility] Education information/materials will improve Outcome: Progressing Discussed with patient and family about plan of care, medication home list and dry weight with some teach back displayed.  Patient concerned about receiving all his at home medications due previous episodes when not on his depression and anxiety medications while in the hospital

## 2017-02-08 ENCOUNTER — Inpatient Hospital Stay (HOSPITAL_COMMUNITY): Payer: Medicare Other

## 2017-02-08 DIAGNOSIS — R400 Somnolence: Secondary | ICD-10-CM

## 2017-02-08 DIAGNOSIS — Z7982 Long term (current) use of aspirin: Secondary | ICD-10-CM

## 2017-02-08 DIAGNOSIS — I5042 Chronic combined systolic (congestive) and diastolic (congestive) heart failure: Secondary | ICD-10-CM

## 2017-02-08 DIAGNOSIS — R112 Nausea with vomiting, unspecified: Secondary | ICD-10-CM

## 2017-02-08 DIAGNOSIS — E11621 Type 2 diabetes mellitus with foot ulcer: Secondary | ICD-10-CM

## 2017-02-08 DIAGNOSIS — Z8249 Family history of ischemic heart disease and other diseases of the circulatory system: Secondary | ICD-10-CM

## 2017-02-08 DIAGNOSIS — E785 Hyperlipidemia, unspecified: Secondary | ICD-10-CM

## 2017-02-08 DIAGNOSIS — Z885 Allergy status to narcotic agent status: Secondary | ICD-10-CM

## 2017-02-08 DIAGNOSIS — I2584 Coronary atherosclerosis due to calcified coronary lesion: Secondary | ICD-10-CM

## 2017-02-08 DIAGNOSIS — R011 Cardiac murmur, unspecified: Secondary | ICD-10-CM

## 2017-02-08 DIAGNOSIS — R0683 Snoring: Secondary | ICD-10-CM

## 2017-02-08 DIAGNOSIS — I358 Other nonrheumatic aortic valve disorders: Secondary | ICD-10-CM

## 2017-02-08 DIAGNOSIS — Z89511 Acquired absence of right leg below knee: Secondary | ICD-10-CM

## 2017-02-08 DIAGNOSIS — I348 Other nonrheumatic mitral valve disorders: Secondary | ICD-10-CM

## 2017-02-08 DIAGNOSIS — L818 Other specified disorders of pigmentation: Secondary | ICD-10-CM

## 2017-02-08 DIAGNOSIS — Z79899 Other long term (current) drug therapy: Secondary | ICD-10-CM

## 2017-02-08 DIAGNOSIS — E669 Obesity, unspecified: Secondary | ICD-10-CM

## 2017-02-08 DIAGNOSIS — L97521 Non-pressure chronic ulcer of other part of left foot limited to breakdown of skin: Secondary | ICD-10-CM

## 2017-02-08 DIAGNOSIS — Z85528 Personal history of other malignant neoplasm of kidney: Secondary | ICD-10-CM

## 2017-02-08 DIAGNOSIS — R188 Other ascites: Secondary | ICD-10-CM

## 2017-02-08 DIAGNOSIS — F32A Depression, unspecified: Secondary | ICD-10-CM

## 2017-02-08 DIAGNOSIS — R197 Diarrhea, unspecified: Secondary | ICD-10-CM

## 2017-02-08 DIAGNOSIS — I2510 Atherosclerotic heart disease of native coronary artery without angina pectoris: Secondary | ICD-10-CM

## 2017-02-08 DIAGNOSIS — I700 Atherosclerosis of aorta: Secondary | ICD-10-CM

## 2017-02-08 DIAGNOSIS — E1122 Type 2 diabetes mellitus with diabetic chronic kidney disease: Secondary | ICD-10-CM

## 2017-02-08 DIAGNOSIS — Z905 Acquired absence of kidney: Secondary | ICD-10-CM

## 2017-02-08 DIAGNOSIS — A419 Sepsis, unspecified organism: Secondary | ICD-10-CM

## 2017-02-08 DIAGNOSIS — F329 Major depressive disorder, single episode, unspecified: Secondary | ICD-10-CM

## 2017-02-08 DIAGNOSIS — R109 Unspecified abdominal pain: Secondary | ICD-10-CM

## 2017-02-08 LAB — BASIC METABOLIC PANEL
Anion gap: 12 (ref 5–15)
BUN: 74 mg/dL — AB (ref 6–20)
CALCIUM: 7.8 mg/dL — AB (ref 8.9–10.3)
CO2: 24 mmol/L (ref 22–32)
CREATININE: 9.03 mg/dL — AB (ref 0.61–1.24)
Chloride: 104 mmol/L (ref 101–111)
GFR calc Af Amer: 7 mL/min — ABNORMAL LOW (ref >60)
GFR, EST NON AFRICAN AMERICAN: 6 mL/min — AB (ref >60)
GLUCOSE: 108 mg/dL — AB (ref 65–99)
Potassium: 4.6 mmol/L (ref 3.5–5.1)
SODIUM: 140 mmol/L (ref 135–145)

## 2017-02-08 LAB — HIV ANTIBODY (ROUTINE TESTING W REFLEX): HIV Screen 4th Generation wRfx: NONREACTIVE

## 2017-02-08 LAB — RENAL FUNCTION PANEL
ALBUMIN: 2.4 g/dL — AB (ref 3.5–5.0)
ANION GAP: 15 (ref 5–15)
BUN: 79 mg/dL — ABNORMAL HIGH (ref 6–20)
CO2: 21 mmol/L — ABNORMAL LOW (ref 22–32)
Calcium: 7.8 mg/dL — ABNORMAL LOW (ref 8.9–10.3)
Chloride: 103 mmol/L (ref 101–111)
Creatinine, Ser: 9.32 mg/dL — ABNORMAL HIGH (ref 0.61–1.24)
GFR calc non Af Amer: 6 mL/min — ABNORMAL LOW (ref >60)
GFR, EST AFRICAN AMERICAN: 7 mL/min — AB (ref >60)
GLUCOSE: 90 mg/dL (ref 65–99)
PHOSPHORUS: 10.3 mg/dL — AB (ref 2.5–4.6)
POTASSIUM: 5.1 mmol/L (ref 3.5–5.1)
Sodium: 139 mmol/L (ref 135–145)

## 2017-02-08 LAB — CBC
HCT: 29.1 % — ABNORMAL LOW (ref 39.0–52.0)
HEMATOCRIT: 27.8 % — AB (ref 39.0–52.0)
HEMOGLOBIN: 8.6 g/dL — AB (ref 13.0–17.0)
Hemoglobin: 8.8 g/dL — ABNORMAL LOW (ref 13.0–17.0)
MCH: 27.1 pg (ref 26.0–34.0)
MCH: 26.2 pg (ref 26.0–34.0)
MCHC: 30.2 g/dL (ref 30.0–36.0)
MCHC: 30.9 g/dL (ref 30.0–36.0)
MCV: 87.7 fL (ref 78.0–100.0)
MCV: 86.6 fL (ref 78.0–100.0)
PLATELETS: 486 10*3/uL — AB (ref 150–400)
Platelets: 463 10*3/uL — ABNORMAL HIGH (ref 150–400)
RBC: 3.17 MIL/uL — AB (ref 4.22–5.81)
RBC: 3.36 MIL/uL — ABNORMAL LOW (ref 4.22–5.81)
RDW: 17.6 % — ABNORMAL HIGH (ref 11.5–15.5)
RDW: 17.2 % — AB (ref 11.5–15.5)
WBC: 11 10*3/uL — ABNORMAL HIGH (ref 4.0–10.5)
WBC: 8.8 10*3/uL (ref 4.0–10.5)

## 2017-02-08 LAB — GLUCOSE, CAPILLARY: GLUCOSE-CAPILLARY: 122 mg/dL — AB (ref 65–99)

## 2017-02-08 LAB — LACTIC ACID, PLASMA: LACTIC ACID, VENOUS: 0.7 mmol/L (ref 0.5–1.9)

## 2017-02-08 LAB — MRSA PCR SCREENING: MRSA BY PCR: NEGATIVE

## 2017-02-08 MED ORDER — GABAPENTIN 100 MG PO CAPS
100.0000 mg | ORAL_CAPSULE | Freq: Three times a day (TID) | ORAL | Status: DC | PRN
  Administered 2017-02-08: 100 mg via ORAL
  Filled 2017-02-08 (×2): qty 1

## 2017-02-08 MED ORDER — PENTAFLUOROPROP-TETRAFLUOROETH EX AERO: 1.0000 "application " | INHALATION_SPRAY | CUTANEOUS | Status: DC | PRN

## 2017-02-08 MED ORDER — HEPARIN SODIUM (PORCINE) 1000 UNIT/ML DIALYSIS: 4000.0000 [IU] | Freq: Once | INTRAMUSCULAR | Status: DC

## 2017-02-08 MED ORDER — SODIUM CHLORIDE 0.9 % IV SOLN: 100.0000 mL | INTRAVENOUS | Status: DC | PRN

## 2017-02-08 MED ORDER — ALPRAZOLAM 0.5 MG PO TABS
1.0000 mg | ORAL_TABLET | Freq: Three times a day (TID) | ORAL | Status: DC | PRN
  Administered 2017-02-08 – 2017-02-10 (×3): 1 mg via ORAL
  Filled 2017-02-08 (×3): qty 2

## 2017-02-08 MED ORDER — DEXTROSE 5 % IV SOLN
1.0000 g | INTRAVENOUS | Status: DC
  Administered 2017-02-08 – 2017-02-14 (×6): 1 g via INTRAVENOUS
  Filled 2017-02-08 (×10): qty 1

## 2017-02-08 MED ORDER — VANCOMYCIN HCL IN DEXTROSE 1-5 GM/200ML-% IV SOLN
1000.0000 mg | INTRAVENOUS | Status: AC
  Administered 2017-02-08: 1000 mg via INTRAVENOUS

## 2017-02-08 MED ORDER — LIDOCAINE-PRILOCAINE 2.5-2.5 % EX CREA: 1.0000 "application " | TOPICAL_CREAM | CUTANEOUS | Status: DC | PRN

## 2017-02-08 MED ORDER — VANCOMYCIN HCL IN DEXTROSE 1-5 GM/200ML-% IV SOLN
INTRAVENOUS | Status: AC
  Filled 2017-02-08: qty 200

## 2017-02-08 MED ORDER — ALTEPLASE 2 MG IJ SOLR
2.0000 mg | Freq: Once | INTRAMUSCULAR | Status: DC | PRN
Start: 2017-02-08 — End: 2017-02-08

## 2017-02-08 MED ORDER — HEPARIN SODIUM (PORCINE) 1000 UNIT/ML DIALYSIS: 1000.0000 [IU] | INTRAMUSCULAR | Status: DC | PRN

## 2017-02-08 MED ORDER — LIDOCAINE HCL (PF) 1 % IJ SOLN: 5.0000 mL | INTRAMUSCULAR | Status: DC | PRN

## 2017-02-08 NOTE — Progress Notes (Addendum)
Pt w/ 3 beat run of non-sustained vtach around 1400. Paged covering internal medicine resident. No new orders

## 2017-02-08 NOTE — Evaluation (Signed)
Physical Therapy Evaluation Patient Details Name: Hayden [name] MRN: [id number] DOB: 06-10-1971 Today's Date: 02/08/2017   History of Present Illness  Mr. Robak is a 46yo male with PMH significant for ESRD on MWF HD (was on peritoneal dialysis for a period of time a few months ago), kidney cancer s/p partial right nephrectomy, history of T2DM s/p R AKA, CHF (last echo 10/2016 with EF 60-65%), obesity, HTN, and HLD who presents with 3 weeks of worsening diffuse abdominal pain with peritoneal catheter that has not been cared for in ~1 month, most concerning for peritonitis.     Clinical Impression  Patient presents with problems listed below.  Will benefit from acute PT to maximize functional mobility prior to discharge.  Patient was independent with transfers bed <> w/c, and car transfers pta.  He was independent in w/c and scooter.  Recommend f/u HHPT at d/c to return patient to PLOF for safety at home.    Follow Up Recommendations Home health PT;Supervision for mobility/OOB    Equipment Recommendations  None recommended by PT    Recommendations for Other Services       Precautions / Restrictions Precautions Precautions: Fall Precaution Comments: Has had falls at home mostly during gait with RW/crutches, and a few during trasnfers. Restrictions Weight Bearing Restrictions: No      Mobility  Bed Mobility Overal bed mobility: Needs Assistance Bed Mobility: Supine to Sit     Supine to sit: Mod assist     General bed mobility comments: Verbal cues to sit EOB.  Patient attempting to sit straight up in bed, pulling on wife's arm and bed rail.  Once upright, patient with good balance - able to reach outside of BOS.  Loses balance posteriorly with larger movements.  Patient falling asleep in sitting, requiring verbal/tactile stimulus to awaken.  Transfers                 General transfer comment: NT due to lethargy.  Ambulation/Gait                Stairs             Wheelchair Mobility    Modified Rankin (Stroke Patients Only)       Balance Overall balance assessment: Needs assistance;History of Falls Sitting-balance support: Single extremity supported Sitting balance-Leahy Scale: Good                                       Pertinent Vitals/Pain Pain Assessment: Faces Faces Pain Scale: Hurts a little bit Pain Location: bottom Pain Descriptors / Indicators: Sore Pain Intervention(s): Monitored during session;Repositioned    Home Living Family/patient expects to be discharged to:: Private residence Living Arrangements: Spouse/significant other;Children;Other relatives Available Help at Discharge: Family;Available 24 hours/day Type of Home: Mobile home Home Access: Ramped entrance     Home Layout: One level Home Equipment: Grab bars - tub/shower;Hand held shower head;Walker - 2 wheels;Wheelchair - manual;Electric scooter;Crutches;Shower seat      Prior Function Level of Independence: Needs assistance   Gait / Transfers Assistance Needed: Primarily uses w/c and scooter.  Does use RW occasionally.  ADL's / Homemaking Assistance Needed: Independent with bathing/dressing per patient.   Assist for meal prep and housekeeping.        Hand Dominance   Dominant Hand: Right    Extremity/Trunk Assessment   Upper Extremity Assessment Upper Extremity Assessment: Overall WFL for tasks  assessed    Lower Extremity Assessment Lower Extremity Assessment: RLE deficits/detail RLE Deficits / Details: Rt AKA - moves against gravity       Communication   Communication: No difficulties (Talking very loudly at times - lethargic.)  Cognition Arousal/Alertness: Lethargic (Falls asleep in the middle of sentence.) Behavior During Therapy: Restless;Agitated;Impulsive Overall Cognitive Status: Impaired/Different from baseline Area of Impairment: Orientation;Attention;Memory;Following commands;Safety/judgement;Problem  solving                 Orientation Level: Disoriented to;Time;Situation Current Attention Level: Focused Memory: Decreased short-term memory Following Commands: Follows one step commands with increased time;Follows one step commands inconsistently Safety/Judgement: Decreased awareness of safety   Problem Solving: Slow processing;Difficulty sequencing;Requires verbal cues General Comments: Question decreased cognition related to meds?      General Comments      Exercises     Assessment/Plan    PT Assessment Patient needs continued PT services  PT Problem List Decreased strength;Decreased activity tolerance;Decreased balance;Decreased mobility;Decreased cognition;Decreased safety awareness;Pain       PT Treatment Interventions DME instruction;Gait training;Functional mobility training;Therapeutic activities;Therapeutic exercise;Balance training;Cognitive remediation;Patient/family education    PT Goals (Current goals can be found in the Care Plan section)  Acute Rehab PT Goals Patient Stated Goal: Wife - Return home PT Goal Formulation: With patient/family Time For Goal Achievement: 02/15/17 Potential to Achieve Goals: Good    Frequency Min 3X/week   Barriers to discharge        Co-evaluation               AM-PAC PT "6 Clicks" Daily Activity  Outcome Measure Difficulty turning over in bed (including adjusting bedclothes, sheets and blankets)?: None Difficulty moving from lying on back to sitting on the side of the bed? : Total Difficulty sitting down on and standing up from a chair with arms (e.g., wheelchair, bedside commode, etc,.)?: Total Help needed moving to and from a bed to chair (including a wheelchair)?: A Little Help needed walking in hospital room?: A Little Help needed climbing 3-5 steps with a railing? : A Lot 6 Click Score: 14    End of Session Equipment Utilized During Treatment: Oxygen Activity Tolerance: Patient limited by  lethargy Patient left: in bed;with call bell/phone within reach;with family/visitor present   PT Visit Diagnosis: Unsteadiness on feet (R26.81);Other abnormalities of gait and mobility (R26.89);History of falling (Z91.81);Pain Pain - part of body:  (Bottom)    Time: 2355-7322 PT Time Calculation (min) (ACUTE ONLY): 25 min   Charges:   PT Evaluation $PT Eval Low Complexity: 1 Low PT Treatments $Therapeutic Activity: 8-22 mins   PT G Codes:        Carita Pian. Sanjuana Kava, [facility] Acute Rehab Services Pager [phone]   Despina Pole 02/08/2017, 5:41 PM

## 2017-02-08 NOTE — Plan of Care (Signed)
Problem: Safety: Goal: Ability to remain free from injury will improve Outcome: Progressing Discussed with patient and family about surgery and plan of care for the evening with some teach back displayed

## 2017-02-08 NOTE — Progress Notes (Signed)
Subjective: No acute events overnight. More awake this morning. Seen in HD. Ordered CPAP however patient did not get a chance to use it last night. I encouraged him to try the CPAP tonight to see how he tolerates it and whether it may reduce his daytime somnolence. Started on renal diet yesterday. Continues to have diffuse abdominal pain. No fevers. WBC 8.8 -> 11.0 today.  Objective:  Vital signs in last 24 hours: Vitals:   02/08/17 0500 02/08/17 0600 02/08/17 0700 02/08/17 0722  BP: 139/83 (!) 157/78 131/88 131/88  Pulse: 84 82 81 78  Resp: 20 16 (!) 24 12  Temp:    98.3 F (36.8 C)  TempSrc:    Oral  SpO2: (!) 85% (!) 87% 91% 92%  Weight:      Height:       GEN: Well-nourished, obese white male sitting in HD bed in NAD. EYES: EOMI. Sclera anicteric. PERRL. RESP: Clear to auscultation bilaterally. No wheezes. CV: Normal rate and regular rhythm. Systolic murmur, best heard at RUSB. 1+ LLE edema. ABD: Soft. Diffusely tender to palpation. Tender to palpation in epigastric region and lower quadrants. Non-distended. Normoactive bowel sounds. PD catheter in place with no obvious erythema or signs of infection surrounding it. EXT: 1+ LLE edema. R AKA.  Labs CBC    Component Value Date/Time   WBC 11.0 (H) 02/08/2017 0822   RBC 3.17 (L) 02/08/2017 0822   HGB 8.6 (L) 02/08/2017 0822   HCT 27.8 (L) 02/08/2017 0822   PLT 463 (H) 02/08/2017 0822   MCV 87.7 02/08/2017 0822   MCH 27.1 02/08/2017 0822   MCHC 30.9 02/08/2017 0822   RDW 17.6 (H) 02/08/2017 0822   LYMPHSABS 1.9 11/28/2016 0547   MONOABS 1.7 (H) 11/28/2016 0547   EOSABS 0.9 (H) 11/28/2016 0547   BASOSABS 0.2 (H) 11/28/2016 0547   BMET    Component Value Date/Time   NA 139 02/08/2017 0823   K 5.1 02/08/2017 0823   CL 103 02/08/2017 0823   CO2 21 (L) 02/08/2017 0823   GLUCOSE 90 02/08/2017 0823   BUN 79 (H) 02/08/2017 0823   CREATININE 9.32 (H) 02/08/2017 0823   CALCIUM 7.8 (L) 02/08/2017 0823   GFRNONAA 6 (L)  02/08/2017 0823   GFRAA 7 (L) 02/08/2017 7322   Lactic acid 1.4 -> 3.1 -> 0.7 this morning  Assessment/Plan:  Active Problems:   Abdominal pain  Mr. Haskell is a 46yo male with PMH significant for ESRD on MWF HD (was on peritoneal dialysis for a period of time a few months ago), kidney cancer s/p partial right nephrectomy, history of T2DM s/p R AKA, CHF (last echo 10/2016 with EF 60-65%), obesity, HTN, and HLD who presents with 3 weeks of worsening diffuse abdominal pain with peritoneal catheter that has not been cared for in ~1 month, most concerning for peritonitis.  # Abdominal Pain, likely secondary to peritonitis from peritoneal catheter Continues to have diffuse abdominal pain. Remains afebrile. WBC 8.8 -> 11.0 today. Was supposed to have catheter removed Thursday 8/9. - Continue empiric vancomycin/cefepime - Nephrology following, appreciate their help - Nephrology planning to talk to General Surgery about getting PD catheter removed - Monitor fever curve - f/u BCx x2 - UA neg for infection - Renal diet as tolerated  # ESRD on HD MWF Patient recently switched from PD to HD. Still has PD catheter in place that has not been cared for. - Nephrology consulted --- HD today --- will flush and care for  PD catheter --- will inform General Surgery about getting PD catheter removed - Continue calcitriol, ferric citrate, cinacalcet  # CHF (last echo 10/2016 with EF 60-65% and grade 2 DD) - continue home labetalol, isosorbide dinitrate, and aspirin  # HTN BP 130s-160s/60s-70s - monitor BP - continue home clonidine, labetalol, amlodipine, and hydralazine  # probable OSA Patient snoring and desaturating to mid 80s while sleeping. Has daytime somnolence. Ordered CPAP, but patient did not use it last night - encouraged patient to use CPAP because it may help his daytime somnolence.  # HLD - continue home pravastatin  # Depression - cont home depakote, escitalopram, ativan  PRN  VTE PPx: SQH  Dispo: Anticipated discharge in approximately 3 day(s).  Colbert Ewing, MD  Internal Medicine, PGY-1 02/08/2017, 8:03 AM P [phone]

## 2017-02-08 NOTE — Consult Note (Signed)
Reason for Consult:PD cath removal Referring Physician: Ernest Haber PA  Hayden [name] is an 46 y.o. male.  HPI: We have been asked to see this patient by Nephrology for PD cath removal.  Cath placed in [facility] earlier.  Patient admitted with peritonitis.  Cultures are pending.  The patient is now undergoing hemodialysis.  He reports that he still has abdominal pain but it is improving.  Past Medical History:  Diagnosis Date  . Anemia March 2014  . Cancer ([place])    Kidney  . CHF (congestive heart failure) ([place]) 5427   Acute systolic and diastolic CHF  . Depression    & rage --  was in counseling....great now  . Diabetes mellitus    NO DM SINCE LOST 130LBS  . ESRD on peritoneal dialysis ([place]) 2018   Started in-center HD approx 2015 for 2 years, then did about 1 year of home HD and then started peritoneal dialysis in early 2018.    . Gangrene ([place])    right leg and foot  . History of kidney cancer   . Hyperlipidemia   . Hypertension   . Obesity   . PONV (postoperative nausea and vomiting)   . Stroke [facility])     Past Surgical History:  Procedure Laterality Date  . ABDOMINAL AORTOGRAM W/LOWER EXTREMITY N/A 10/28/2016   Procedure: Abdominal Aortogram w/Lower Extremity;  Surgeon: Angelia Mould, MD;  Location: [place] CV LAB;  Service: Cardiovascular;  Laterality: N/A;  . AMPUTATION Right 11/05/2016   Procedure: AMPUTATION RIGHT FIRST RAY;  Surgeon: Conrad Canones, MD;  Location: [place];  Service: Vascular;  Laterality: Right;  . AMPUTATION Right 12/04/2016   Procedure: RIGHT ABOVE KNEE AMPUTATION;  Surgeon: Newt Minion, MD;  Location: [place];  Service: Orthopedics;  Laterality: Right;  . AV FISTULA PLACEMENT Left 01/26/2013   Procedure: ARTERIOVENOUS (AV) FISTULA CREATION - LEFT RADIAL CEPHALIC AVF;  Surgeon: Angelia Mould, MD;  Location: [place];  Service: Vascular;  Laterality: Left;  . CHOLECYSTECTOMY  11/06/2015   Procedure: LAPAROSCOPIC CHOLECYSTECTOMY;  Surgeon:  Ralene Ok, MD;  Location: [place];  Service: General;;  . FEMORAL-POPLITEAL BYPASS GRAFT Right 11/05/2016   Procedure: BYPASS GRAFT FEMORAL-POPLITEAL ARTERY USING NON-REVERSED RIGHT GREATER SAPPHENOUS VEIN;  Surgeon: Conrad Riverdale, MD;  Location: [place];  Service: Vascular;  Laterality: Right;  . HERNIA REPAIR    . LOWER EXTREMITY ANGIOGRAM Right 10/30/2016   Procedure: Right  LOWER EXTREMITY ANGIOGRAM WITH RIGHT SUPERFICIAL FEMORAL ARTERY balloon angioplasty;  Surgeon: Conrad , MD;  Location: [place];  Service: Vascular;  Laterality: Right;  . NEPHRECTOMY Right 2008   partial  . TESTICLE TORSION REDUCTION    . TONSILLECTOMY AND ADENOIDECTOMY    . VEIN HARVEST Right 11/05/2016   Procedure: RIGHT GREATER SAPPHENOUS VEIN HARVEST;  Surgeon: Conrad , MD;  Location: [facility] OR;  Service: Vascular;  Laterality: Right;    Family History  Problem Relation Age of Onset  . Heart disease Mother        Heart Disease before age 9  . Deep vein thrombosis Father   . Heart attack Father   . Other Other     Social History:  reports that he has been smoking Cigarettes.  He has a 2.50 pack-year smoking history. He has never used smokeless tobacco. He reports that he uses drugs, including Marijuana. He reports that he does not drink alcohol.  Allergies:  Allergies  Allergen Reactions  . Dilaudid [Hydromorphone  Hcl] Other (See Comments)    ABNORMAL BEHAVIORS "VERBALLY AND PHYSICALLY ABUSIVE" PSYCHOSIS  . Morphine And Related Itching and Other (See Comments)  . Adhesive [Tape] Other (See Comments)    Redness from adhesive tape if left on too long, paper tape is preferred    Medications: I have reviewed the patient's current medications.  Results for orders placed or performed during the hospital encounter of 02/07/17 (from the past 48 hour(s))  Lipase, blood     Status: None   Collection Time: 02/07/17 12:08 PM  Result Value Ref Range   Lipase 25 11 - 51 U/L  Comprehensive metabolic panel      Status: Abnormal   Collection Time: 02/07/17 12:08 PM  Result Value Ref Range   Sodium 140 135 - 145 mmol/L   Potassium 4.8 3.5 - 5.1 mmol/L   Chloride 102 101 - 111 mmol/L   CO2 23 22 - 32 mmol/L   Glucose, Bld 121 (H) 65 - 99 mg/dL   BUN 71 (H) 6 - 20 mg/dL   Creatinine, Ser 9.01 (H) 0.61 - 1.24 mg/dL   Calcium 8.5 (L) 8.9 - 10.3 mg/dL   Total Protein 6.4 (L) 6.5 - 8.1 g/dL   Albumin 2.8 (L) 3.5 - 5.0 g/dL   AST 11 (L) 15 - 41 U/L   ALT 11 (L) 17 - 63 U/L   Alkaline Phosphatase 219 (H) 38 - 126 U/L   Total Bilirubin 0.9 0.3 - 1.2 mg/dL   GFR calc non Af Amer 6 (L) >60 mL/min   GFR calc Af Amer 7 (L) >60 mL/min    Comment: (NOTE) The eGFR has been calculated using the CKD EPI equation. This calculation has not been validated in all clinical situations. eGFR's persistently <60 mL/min signify possible Chronic Kidney Disease.    Anion gap 15 5 - 15  CBC     Status: Abnormal   Collection Time: 02/07/17 12:08 PM  Result Value Ref Range   WBC 9.4 4.0 - 10.5 K/uL   RBC 3.58 (L) 4.22 - 5.81 MIL/uL   Hemoglobin 9.7 (L) 13.0 - 17.0 g/dL   HCT 31.4 (L) 39.0 - 52.0 %   MCV 87.7 78.0 - 100.0 fL   MCH 27.1 26.0 - 34.0 pg   MCHC 30.9 30.0 - 36.0 g/dL   RDW 17.8 (H) 11.5 - 15.5 %   Platelets 532 (H) 150 - 400 K/uL  I-Stat CG4 Lactic Acid, ED     Status: None   Collection Time: 02/07/17 12:23 PM  Result Value Ref Range   Lactic Acid, Venous 1.41 0.5 - 1.9 mmol/L  I-Stat CG4 Lactic Acid, ED     Status: Abnormal   Collection Time: 02/07/17  2:38 PM  Result Value Ref Range   Lactic Acid, Venous 3.14 (HH) 0.5 - 1.9 mmol/L   Comment NOTIFIED PHYSICIAN   Blood gas, venous     Status: None   Collection Time: 02/07/17  4:00 PM  Result Value Ref Range   FIO2 TEST WILL BE CREDITED    O2 Content TEST WILL BE CREDITED L/min   Delivery systems TEST WILL BE CREDITED    Mode TEST WILL BE CREDITED    VT TEST WILL BE CREDITED mL   LHR TEST WILL BE CREDITED resp/min   Hi Frequency JET Vent Rate  TEST WILL BE CREDITED    Peep/cpap TEST WILL BE CREDITED cm H20   PIP TEST WILL BE CREDITED cm H2O   Hi Frequency JET  Vent PIP TEST WILL BE CREDITED    Pressure support TEST WILL BE CREDITED cm H20   Pressure control TEST WILL BE CREDITED cm H20   pH, Ven TEST WILL BE CREDITED 7.250 - 7.430   pCO2, Ven TEST WILL BE CREDITED 44.0 - 60.0 mmHg   pO2, Ven TEST WILL BE CREDITED 32.0 - 45.0 mmHg   Bicarbonate TEST WILL BE CREDITED 20.0 - 28.0 mmol/L   Acid-Base Excess TEST WILL BE CREDITED 0.0 - 2.0 mmol/L   Acid-base deficit TEST WILL BE CREDITED 0.0 - 2.0 mmol/L   O2 Saturation TEST WILL BE CREDITED %   Patient temperature TEST WILL BE CREDITED    Amplitude TEST WILL BE CREDITED    Map TEST WILL BE CREDITED cmH20   Hertz TEST WILL BE CREDITED    Nitric Oxide TEST WILL BE CREDITED    Collection site TEST WILL BE CREDITED    Drawn by TEST WILL BE CREDITED    Sample type TEST WILL BE CREDITED    Inspiratory PAP TEST WILL BE CREDITED    Expiratory PAP TEST WILL BE CREDITED    Mechanical Rate TEST WILL BE CREDITED   Ammonia     Status: Abnormal   Collection Time: 02/07/17  4:17 PM  Result Value Ref Range   Ammonia 38 (H) 9 - 35 umol/L  I-Stat venous blood gas, ED     Status: Abnormal   Collection Time: 02/07/17  5:17 PM  Result Value Ref Range   pH, Ven 7.509 (H) 7.250 - 7.430   pCO2, Ven 28.7 (L) 44.0 - 60.0 mmHg   pO2, Ven 187.0 (H) 32.0 - 45.0 mmHg   Bicarbonate 22.9 20.0 - 28.0 mmol/L   TCO2 24 0 - 100 mmol/L   O2 Saturation 100.0 %   Patient temperature HIDE    Sample type VENOUS   MRSA PCR Screening     Status: None   Collection Time: 02/07/17 11:26 PM  Result Value Ref Range   MRSA by PCR NEGATIVE NEGATIVE    Comment:        The GeneXpert MRSA Assay (FDA approved for NASAL specimens only), is one component of a comprehensive MRSA colonization surveillance program. It is not intended to diagnose MRSA infection nor to guide or monitor treatment for MRSA infections.    CBC     Status: Abnormal   Collection Time: 02/07/17 11:53 PM  Result Value Ref Range   WBC 8.8 4.0 - 10.5 K/uL   RBC 3.36 (L) 4.22 - 5.81 MIL/uL   Hemoglobin 8.8 (L) 13.0 - 17.0 g/dL   HCT 29.1 (L) 39.0 - 52.0 %   MCV 86.6 78.0 - 100.0 fL   MCH 26.2 26.0 - 34.0 pg   MCHC 30.2 30.0 - 36.0 g/dL   RDW 17.2 (H) 11.5 - 15.5 %   Platelets 486 (H) 150 - 400 K/uL  Basic metabolic panel     Status: Abnormal   Collection Time: 02/07/17 11:53 PM  Result Value Ref Range   Sodium 140 135 - 145 mmol/L   Potassium 4.6 3.5 - 5.1 mmol/L   Chloride 104 101 - 111 mmol/L   CO2 24 22 - 32 mmol/L   Glucose, Bld 108 (H) 65 - 99 mg/dL   BUN 74 (H) 6 - 20 mg/dL   Creatinine, Ser 9.03 (H) 0.61 - 1.24 mg/dL   Calcium 7.8 (L) 8.9 - 10.3 mg/dL   GFR calc non Af Amer 6 (L) >60 mL/min  GFR calc Af Amer 7 (L) >60 mL/min    Comment: (NOTE) The eGFR has been calculated using the CKD EPI equation. This calculation has not been validated in all clinical situations. eGFR's persistently <60 mL/min signify possible Chronic Kidney Disease.    Anion gap 12 5 - 15  CBC     Status: Abnormal   Collection Time: 02/08/17  8:22 AM  Result Value Ref Range   WBC 11.0 (H) 4.0 - 10.5 K/uL   RBC 3.17 (L) 4.22 - 5.81 MIL/uL   Hemoglobin 8.6 (L) 13.0 - 17.0 g/dL   HCT 27.8 (L) 39.0 - 52.0 %   MCV 87.7 78.0 - 100.0 fL   MCH 27.1 26.0 - 34.0 pg   MCHC 30.9 30.0 - 36.0 g/dL   RDW 17.6 (H) 11.5 - 15.5 %   Platelets 463 (H) 150 - 400 K/uL  Renal function panel     Status: Abnormal   Collection Time: 02/08/17  8:23 AM  Result Value Ref Range   Sodium 139 135 - 145 mmol/L   Potassium 5.1 3.5 - 5.1 mmol/L   Chloride 103 101 - 111 mmol/L   CO2 21 (L) 22 - 32 mmol/L   Glucose, Bld 90 65 - 99 mg/dL   BUN 79 (H) 6 - 20 mg/dL   Creatinine, Ser 9.32 (H) 0.61 - 1.24 mg/dL   Calcium 7.8 (L) 8.9 - 10.3 mg/dL   Phosphorus 10.3 (H) 2.5 - 4.6 mg/dL   Albumin 2.4 (L) 3.5 - 5.0 g/dL   GFR calc non Af Amer 6 (L) >60 mL/min   GFR  calc Af Amer 7 (L) >60 mL/min    Comment: (NOTE) The eGFR has been calculated using the CKD EPI equation. This calculation has not been validated in all clinical situations. eGFR's persistently <60 mL/min signify possible Chronic Kidney Disease.    Anion gap 15 5 - 15  Lactic acid, plasma     Status: None   Collection Time: 02/08/17  8:24 AM  Result Value Ref Range   Lactic Acid, Venous 0.7 0.5 - 1.9 mmol/L    Ct Abdomen Pelvis W Contrast  Result Date: 02/07/2017 CLINICAL DATA:  Severe lower abdominal pain with nausea, vomiting and diarrhea for 3 weeks. Peritoneal dialysis patient. EXAM: CT ABDOMEN AND PELVIS WITH CONTRAST TECHNIQUE: Multidetector CT imaging of the abdomen and pelvis was performed using the standard protocol following bolus administration of intravenous contrast. CONTRAST:  100 ml ISOVUE-300 IOPAMIDOL (ISOVUE-300) INJECTION 61% COMPARISON:  CT chest, abdomen and pelvis 11/15/2015. FINDINGS: Lower chest: There is cardiomegaly. Extensive calcific coronary artery disease is identified. There is calcification of the mitral annulus and aortic valve. Dependent bibasilar atelectasis noted. No pericardial effusion. Hepatobiliary: Acute punctate calcifications are seen the liver likely vascular. The liver is otherwise normal appearance. The patient is status post cholecystectomy. Small calcifications along the gallbladder fossa are due to hematoma seen on the prior CT scan. The hematoma has resolved. Biliary tree is unremarkable. Pancreas: Unremarkable. No pancreatic ductal dilatation or surrounding inflammatory changes. Spleen: Normal in size without focal abnormality. Adrenals/Urinary Tract: The kidneys are atrophic. No focal lesion. Only small volume of urine is seen in the urinary bladder. Adrenal glands appear normal. Stomach/Bowel: Stomach is within normal limits. Appendix appears normal. No evidence of bowel wall thickening, distention or inflammatory changes. Vascular/Lymphatic:  Extensive atherosclerosis is present. No aortic or iliac artery aneurysm. No lymphadenopathy. Reproductive: Prostate is unremarkable. Other: Peritoneal dialysis catheter in the pelvis is noted. Small volume of  abdominal and pelvic fluid is consistent with peritoneal dialysis. Body wall edema is noted. Musculoskeletal: No fracture or worrisome lesion. Broad-based disc bulge with calcification of the annulus L5-S1 noted. IMPRESSION: No acute abnormality abdomen or pelvis. No finding to explain the patient's symptoms. Cardiomegaly. Extensive calcific aortic and coronary atherosclerosis. Calcification of the mitral annulus and aortic valve also noted. Body wall edema compatible with volume overload. Peritoneal dialysis catheter in place with associated small volume of abdominal and pelvic fluid. Electronically Signed   By: Inge Rise M.D.   On: 02/07/2017 15:43    Review of Systems  All other systems reviewed and are negative.  Blood pressure (!) 147/81, pulse 72, temperature 98.4 F (36.9 C), temperature source Oral, resp. rate 16, height 5' 9"  (1.753 m), weight 96.8 kg (213 lb 6.5 oz), SpO2 95 %. Physical Exam  Constitutional: He is oriented to person, place, and time. He appears well-developed and well-nourished. No distress.  HENT:  Head: Normocephalic and atraumatic.  Right Ear: External ear normal.  Left Ear: External ear normal.  Nose: Nose normal.  Eyes: Pupils are equal, round, and reactive to light. No scleral icterus.  Neck: Normal range of motion. No tracheal deviation present.  Cardiovascular: Normal rate, regular rhythm and normal heart sounds.   Respiratory: Effort normal and breath sounds normal. No respiratory distress.  GI: Soft. There is tenderness. There is no rebound.  Obese. There is minimal tenderness. The catheter is in the right upper quadrant  Neurological: He is alert and oriented to person, place, and time.  Skin: No erythema.  Psychiatric: His behavior is normal.     Assessment/Plan: PD catheter not  properly functioning with peritonitis  We'll plan on removing the catheter in the operating room either Monday or Tuesday depending on schedule unless he becomes acutely ill and requires removal this weekend  [facility] A 02/08/2017, 11:26 AM

## 2017-02-08 NOTE — Progress Notes (Signed)
Nutrition Brief Note  Patient consulted due to "Malnutrition"?  Wt Readings from Last 15 Encounters:  02/08/17 213 lb 6.5 oz (96.8 kg)  01/09/17 180 lb (81.6 kg)  12/24/16 180 lb (81.6 kg)  12/19/16 180 lb (81.6 kg)  12/14/16 180 lb 5.4 oz (81.8 kg)  12/08/16 183 lb 13.8 oz (83.4 kg)  12/02/16 191 lb (86.6 kg)  11/28/16 191 lb 12.8 oz (87 kg)  11/19/16 187 lb (84.8 kg)  07/12/16 220 lb (99.8 kg)  11/18/15 222 lb 14.2 oz (101.1 kg)  11/09/15 229 lb 15 oz (104.3 kg)  06/20/15 233 lb 14.5 oz (106.1 kg)  05/16/14 240 lb (108.9 kg)  11/30/13 238 lb (108 kg)   Body mass index is 31.51 kg/m. Patient meets criteria for Obese based on current BMI.   RD reported he was seeing him due to report of being malnourished and he replied "how the hell am I malnourished?". He says he has had abdominal pain, but this has largely not affected his appetite or meal intake. Weight history difficult to trend given recent AKA and change to HD from PD.   He is slightly confused, however there is also no documentation of decreased intake or weight loss in chart. There is documentation stating his pain is not worse with eating.   RD offered supplements to help fight infection, but he begged not to be given them due to strong dislike. He states he will eat well.   No nutrition interventions warranted at this time. If nutrition issues arise, please consult RD.   Burtis Junes RD, LDN, CNSC Clinical Nutrition Pager: [phone] 02/08/2017 9:29 AM

## 2017-02-08 NOTE — Consult Note (Signed)
[facility] Renal Consultation Note  Indication for Consultation:  Management of ESRD/hemodialysis; anemia, hypertension/volume and secondary hyperparathyroidism  HPI: Hayden [name] is a 46 y.o. 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 Pain not necessarily worse with eating . In the ER, BP 134/64, HR 77, temp 98, O2 95% on RA. WBC 9.4. Lactic acid 1.4 -> 3.1. Lipase normal. Noted his PD catheter that has not been cared for for the last month despite arrangements made at his kidney center  after switching from PD to HD since 11/2016 admit .He has  failed to keep Apts for removal PD cath(at [facility] ) and flushing  PD cath.  Also noted he lives in [place]  And chooses to drive to [facility] on for OP HD despite attempts to obtain closer unit . He missed his last HD 02/07/17 2/2 to admit , now K 5.1 and Volume okay. He denies fevers, chills, chest pain , sob.        We are consulted for HD / ESRD needs , currently on HD  Sleeping awoken , thinks pain is better. PD cath flushed on HD and clear fluid except "tiny bits of Fiber" per RN      Past Medical History:  Diagnosis Date  . Anemia March 2014  . Cancer ([place])    Kidney  . CHF (congestive heart failure) ([place]) 7741   Acute systolic and diastolic CHF  . Depression    & rage --  was in counseling....great now  . Diabetes mellitus    NO DM SINCE LOST 130LBS  . ESRD on peritoneal dialysis ([place]) 2018   Started  in-center HD approx 2015 for 2 years, then did about 1 year of home HD and then started peritoneal dialysis in early 2018.    . Gangrene ([place])    right leg and foot  . History of kidney cancer   . Hyperlipidemia   . Hypertension   . Obesity   . PONV (postoperative nausea and vomiting)   . Stroke [facility])     Past Surgical History:  Procedure Laterality Date  . ABDOMINAL AORTOGRAM W/LOWER EXTREMITY N/A 10/28/2016   Procedure: Abdominal Aortogram w/Lower Extremity;  Surgeon: Angelia Mould, MD;  Location: [place] [facility];  Service: Cardiovascular;  Laterality: N/A;  . AMPUTATION Right 11/05/2016   Procedure: AMPUTATION RIGHT FIRST RAY;  Surgeon: Conrad [place], MD;  Location: [place];  Service: Vascular;  Laterality: Right;  . AMPUTATION Right 12/04/2016   Procedure: RIGHT ABOVE KNEE AMPUTATION;  Surgeon: Newt Minion, MD;  Location: [place];  Service: Orthopedics;  Laterality: Right;  . AV FISTULA PLACEMENT Left 01/26/2013   Procedure: ARTERIOVENOUS (AV) FISTULA CREATION - LEFT RADIAL CEPHALIC AVF;  Surgeon: Angelia Mould, MD;  Location: [place];  Service: Vascular;  Laterality: Left;  . CHOLECYSTECTOMY  11/06/2015   Procedure: LAPAROSCOPIC CHOLECYSTECTOMY;  Surgeon: Ralene Ok, MD;  Location: [place];  Service: General;;  . FEMORAL-POPLITEAL BYPASS GRAFT Right 11/05/2016   Procedure: BYPASS GRAFT FEMORAL-POPLITEAL ARTERY USING NON-REVERSED RIGHT GREATER SAPPHENOUS VEIN;  Surgeon: Conrad [place], MD;  Location: [place];  Service: Vascular;  Laterality: Right;  . HERNIA REPAIR    . LOWER EXTREMITY ANGIOGRAM Right 10/30/2016   Procedure: Right  LOWER EXTREMITY ANGIOGRAM WITH RIGHT SUPERFICIAL FEMORAL ARTERY balloon angioplasty;  Surgeon: Conrad [place], MD;  Location: [place];  Service: Vascular;  Laterality: Right;  . NEPHRECTOMY Right 2008   partial  . TESTICLE TORSION REDUCTION    . TONSILLECTOMY AND ADENOIDECTOMY    . VEIN HARVEST Right 11/05/2016   Procedure: RIGHT GREATER SAPPHENOUS VEIN  HARVEST;  Surgeon: Conrad [place], MD;  Location[facility];  Service: Vascular;  Laterality: Right;      Family History  Problem Relation Age of Onset  . Heart disease Mother        Heart Disease before age 8  . Deep vein thrombosis Father   . Heart attack Father   . Other Other       reports that he has been smoking Cigarettes.  He has a 2.50 pack-year smoking history. He has never used smokeless tobacco. He reports that he uses drugs, including Marijuana. He reports that he does not drink alcohol.   Allergies  Allergen Reactions  . Dilaudid [Hydromorphone Hcl] Other (See Comments)    ABNORMAL BEHAVIORS "VERBALLY AND PHYSICALLY ABUSIVE" PSYCHOSIS  . Morphine And Related Itching and Other (See Comments)  . Adhesive [Tape] Other (See Comments)    Redness from adhesive tape if left on too long, paper tape is preferred    Prior to Admission medications   Medication Sig Start Date End Date Taking? Authorizing Provider  ALPRAZolam Duanne Moron) 1 MG tablet Take 1 mg by mouth 3 (three) times daily. 02/06/17  Yes [provider]  amLODipine (NORVASC) 10 MG tablet Take 1 tablet (10 mg total) by mouth daily. 12/13/16  Yes Angiulli, Lavon Paganini, PA-C  aspirin EC 81 MG tablet Take 81 mg by mouth daily.   Yes [provider]  cinacalcet (SENSIPAR) 60 MG tablet Take 1 tablet (60 mg total) by mouth daily. 12/13/16  Yes Angiulli, Lavon Paganini, PA-C  cloNIDine (CATAPRES - DOSED IN MG/24 HR) 0.2 mg/24hr patch Place 1 patch (0.2 mg total) onto the skin once a week. Patient taking differently: Place 0.2 mg onto the skin every Friday.  12/13/16  Yes Angiulli, Lavon Paganini, PA-C  divalproex (DEPAKOTE SPRINKLE) 125 MG capsule Take 4 capsules (500 mg total) by mouth every 12 (twelve) hours. 12/13/16  Yes Angiulli, Lavon Paganini, PA-C  escitalopram (LEXAPRO) 10 MG tablet Take 1 tablet (10 mg total) by mouth daily. Patient taking differently: Take 20 mg by mouth daily.  12/13/16  Yes Angiulli, Lavon Paganini, PA-C  ferric  citrate (AURYXIA) 1 GM 210 MG(Fe) tablet Take 2 tablets (420 mg total) by mouth 3 (three) times daily with meals. 12/13/16  Yes Angiulli, Lavon Paganini, PA-C  furosemide (LASIX) 80 MG tablet Take 80 mg by mouth 2 (two) times daily.   Yes [provider]  gabapentin (NEURONTIN) 100 MG capsule Take 1 capsule (100 mg total) by mouth 3 (three) times daily. 12/13/16  Yes Angiulli, Lavon Paganini, PA-C  hydrALAZINE (APRESOLINE) 100 MG tablet Take 1 tablet (100 mg total) by mouth every 8 (eight) hours. Patient taking differently: Take 100 mg by mouth 2 (two) times daily.  12/13/16  Yes Angiulli, Lavon Paganini, PA-C  isosorbide  dinitrate (ISORDIL) 5 MG tablet Take 1 tablet (5 mg total) by mouth 2 (two) times daily. Patient taking differently: Take 2.5 mg by mouth 2 (two) times daily.  12/13/16  Yes Angiulli, Lavon Paganini, PA-C  labetalol (NORMODYNE) 300 MG tablet Take 1 tablet (300 mg total) by mouth 2 (two) times daily. 12/13/16  Yes Angiulli, Lavon Paganini, PA-C  methocarbamol (ROBAXIN) 500 MG tablet Take 1 tablet (500 mg total) by mouth every 6 (six) hours as needed for muscle spasms. Patient taking differently: Take 500 mg by mouth 3 (three) times daily.  12/13/16  Yes Angiulli, Lavon Paganini, PA-C  multivitamin (RENA-VIT) TABS tablet Take 1 tablet by mouth at bedtime. 12/13/16  Yes Angiulli, Lavon Paganini, PA-C  mupirocin ointment (BACTROBAN) 2 % Apply 1 application topically See admin instructions. Apply to wound on foot three times daily 02/06/17  Yes [provider]  omeprazole (PRILOSEC) 40 MG capsule Take 1 capsule (40 mg total) by mouth daily. 12/13/16  Yes Angiulli, Lavon Paganini, PA-C  pravastatin (PRAVACHOL) 20 MG tablet Take 1 tablet (20 mg total) by mouth daily. 12/13/16  Yes Angiulli, Lavon Paganini, PA-C  aspirin EC 325 MG EC tablet Take 1 tablet (325 mg total) by mouth daily. Patient not taking: Reported on 02/07/2017 12/13/16   Angiulli, Lavon Paganini, PA-C  calcitRIOL (ROCALTROL) 0.5 MCG capsule Take 1 capsule (0.5 mcg total) by  mouth daily. Patient not taking: Reported on 02/07/2017 12/13/16   Angiulli, Lavon Paganini, PA-C  HYDROcodone-acetaminophen [facility]) 10-325 MG tablet Take 1-2 tablets by mouth every 6 (six) hours as needed (breakthrough pain). Patient not taking: Reported on 02/07/2017 12/24/16   Newt Minion, MD  LORazepam (ATIVAN) 1 MG tablet Take 1 tablet (1 mg total) by mouth every 6 (six) hours as needed for anxiety or sleep. Patient not taking: Reported on 02/07/2017 12/13/16   Cathlyn Parsons, PA-C     Anti-infectives    Start     Dose/Rate Route Frequency Ordered Stop   02/08/17 1800  ceFEPIme (MAXIPIME) 1 g in dextrose 5 % 50 mL IVPB     1 g 100 mL/hr over 30 Minutes Intravenous Every 24 hours 02/08/17 1041     02/08/17 1045  vancomycin (VANCOCIN) IVPB 1000 mg/200 mL premix     1,000 mg 200 mL/hr over 60 Minutes Intravenous To Hemodialysis 02/08/17 1041 02/09/17 1045   02/07/17 1715  ceFEPIme (MAXIPIME) 1 g in dextrose 5 % 50 mL IVPB     1 g 100 mL/hr over 30 Minutes Intravenous  Once 02/07/17 1700 02/07/17 1818   02/07/17 1715  vancomycin (VANCOCIN) IVPB 750 mg/150 ml premix     750 mg 150 mL/hr over 60 Minutes Intravenous  Once 02/07/17 1704 02/07/17 1845   02/07/17 1515  vancomycin (VANCOCIN) IVPB 1000 mg/200 mL premix  Status:  Discontinued     1,000 mg 200 mL/hr over 60 Minutes Intravenous  Once 02/07/17 1503 02/07/17 1740   02/07/17 1515  piperacillin-tazobactam (ZOSYN) IVPB 3.375 g  Status:  Discontinued     3.375 g 100 mL/hr over 30 Minutes Intravenous  Once 02/07/17 1503 02/07/17 1644      Results for orders placed or performed during the hospital encounter of 02/07/17 (from the past 48 hour(s))  Lipase, blood     Status: None   Collection Time: 02/07/17 12:08 PM  Result Value Ref Range   Lipase 25 11 - 51 U/L  Comprehensive metabolic panel     Status: Abnormal   Collection Time:  02/07/17 12:08 PM  Result Value Ref Range   Sodium 140 135 - 145 mmol/L   Potassium 4.8 3.5 - 5.1 mmol/L    Chloride 102 101 - 111 mmol/L   CO2 23 22 - 32 mmol/L   Glucose, Bld 121 (H) 65 - 99 mg/dL   BUN 71 (H) 6 - 20 mg/dL   Creatinine, Ser 9.01 (H) 0.61 - 1.24 mg/dL   Calcium 8.5 (L) 8.9 - 10.3 mg/dL   Total Protein 6.4 (L) 6.5 - 8.1 g/dL   Albumin 2.8 (L) 3.5 - 5.0 g/dL   AST 11 (L) 15 - 41 U/L   ALT 11 (L) 17 - 63 U/L   Alkaline Phosphatase 219 (H) 38 - 126 U/L   Total Bilirubin 0.9 0.3 - 1.2 mg/dL   GFR calc non Af Amer 6 (L) >60 mL/min   GFR calc Af Amer 7 (L) >60 mL/min    Comment: (NOTE) The eGFR has been calculated using the CKD EPI equation. This calculation has not been validated in all clinical situations. eGFR's persistently <60 mL/min signify possible Chronic Kidney Disease.    Anion gap 15 5 - 15  CBC     Status: Abnormal   Collection Time: 02/07/17 12:08 PM  Result Value Ref Range   WBC 9.4 4.0 - 10.5 K/uL   RBC 3.58 (L) 4.22 - 5.81 MIL/uL   Hemoglobin 9.7 (L) 13.0 - 17.0 g/dL   HCT 31.4 (L) 39.0 - 52.0 %   MCV 87.7 78.0 - 100.0 fL   MCH 27.1 26.0 - 34.0 pg   MCHC 30.9 30.0 - 36.0 g/dL   RDW 17.8 (H) 11.5 - 15.5 %   Platelets 532 (H) 150 - 400 K/uL  I-Stat CG4 Lactic Acid, ED     Status: None   Collection Time: 02/07/17 12:23 PM  Result Value Ref Range   Lactic Acid, Venous 1.41 0.5 - 1.9 mmol/L  I-Stat CG4 Lactic Acid, ED     Status: Abnormal   Collection Time: 02/07/17  2:38 PM  Result Value Ref Range   Lactic Acid, Venous 3.14 (HH) 0.5 - 1.9 mmol/L   Comment NOTIFIED PHYSICIAN   Blood gas, venous     Status: None   Collection Time: 02/07/17  4:00 PM  Result Value Ref Range   FIO2 TEST WILL BE CREDITED    O2 Content TEST WILL BE CREDITED L/min   Delivery systems TEST WILL BE CREDITED    Mode TEST WILL BE CREDITED    VT TEST WILL BE CREDITED mL   LHR TEST WILL BE CREDITED resp/min   Hi Frequency JET Vent Rate TEST WILL BE CREDITED    Peep/cpap TEST WILL BE CREDITED cm H20   PIP TEST WILL BE CREDITED cm H2O   Hi Frequency JET Vent PIP TEST WILL BE  CREDITED    Pressure support TEST WILL BE CREDITED cm H20   Pressure control TEST WILL BE CREDITED cm H20   pH, Ven TEST WILL BE CREDITED 7.250 - 7.430   pCO2, Ven TEST WILL BE CREDITED 44.0 - 60.0 mmHg   pO2, Ven TEST WILL BE CREDITED 32.0 - 45.0 mmHg   Bicarbonate TEST WILL BE CREDITED 20.0 - 28.0 mmol/L   Acid-Base Excess TEST WILL BE CREDITED 0.0 - 2.0 mmol/L   Acid-base deficit TEST WILL BE CREDITED 0.0 - 2.0 mmol/L   O2 Saturation TEST WILL BE CREDITED %   Patient temperature TEST WILL BE CREDITED    Amplitude TEST  WILL BE CREDITED    Map TEST WILL BE CREDITED cmH20   Hertz TEST WILL BE CREDITED    Nitric Oxide TEST WILL BE CREDITED    Collection site TEST WILL BE CREDITED    Drawn by TEST WILL BE CREDITED    Sample type TEST WILL BE CREDITED    Inspiratory PAP TEST WILL BE CREDITED    Expiratory PAP TEST WILL BE CREDITED    Mechanical Rate TEST WILL BE CREDITED   Ammonia     Status: Abnormal   Collection Time: 02/07/17  4:17 PM  Result Value Ref Range   Ammonia 38 (H) 9 - 35 umol/L  I-Stat venous blood gas, ED     Status: Abnormal   Collection Time: 02/07/17  5:17 PM  Result Value Ref Range   pH, Ven 7.509 (H) 7.250 - 7.430   pCO2, Ven 28.7 (L) 44.0 - 60.0 mmHg   pO2, Ven 187.0 (H) 32.0 - 45.0 mmHg   Bicarbonate 22.9 20.0 - 28.0 mmol/L   TCO2 24 0 - 100 mmol/L   O2 Saturation 100.0 %   Patient temperature HIDE    Sample type VENOUS   MRSA PCR Screening     Status: None   Collection Time: 02/07/17 11:26 PM  Result Value Ref Range   MRSA by PCR NEGATIVE NEGATIVE    Comment:        The GeneXpert MRSA Assay (FDA approved for NASAL specimens only), is one component of a comprehensive MRSA colonization surveillance program. It is not intended to diagnose MRSA infection nor to guide or monitor treatment for MRSA infections.   CBC     Status: Abnormal   Collection Time: 02/07/17 11:53 PM  Result Value Ref Range   WBC 8.8 4.0 - 10.5 K/uL   RBC 3.36 (L) 4.22 - 5.81  MIL/uL   Hemoglobin 8.8 (L) 13.0 - 17.0 g/dL   HCT 29.1 (L) 39.0 - 52.0 %   MCV 86.6 78.0 - 100.0 fL   MCH 26.2 26.0 - 34.0 pg   MCHC 30.2 30.0 - 36.0 g/dL   RDW 17.2 (H) 11.5 - 15.5 %   Platelets 486 (H) 150 - 400 K/uL  Basic metabolic panel     Status: Abnormal   Collection Time: 02/07/17 11:53 PM  Result Value Ref Range   Sodium 140 135 - 145 mmol/L   Potassium 4.6 3.5 - 5.1 mmol/L   Chloride 104 101 - 111 mmol/L   CO2 24 22 - 32 mmol/L   Glucose, Bld 108 (H) 65 - 99 mg/dL   BUN 74 (H) 6 - 20 mg/dL   Creatinine, Ser 9.03 (H) 0.61 - 1.24 mg/dL   Calcium 7.8 (L) 8.9 - 10.3 mg/dL   GFR calc non Af Amer 6 (L) >60 mL/min   GFR calc Af Amer 7 (L) >60 mL/min    Comment: (NOTE) The eGFR has been calculated using the CKD EPI equation. This calculation has not been validated in all clinical situations. eGFR's persistently <60 mL/min signify possible Chronic Kidney Disease.    Anion gap 12 5 - 15  CBC     Status: Abnormal   Collection Time: 02/08/17  8:22 AM  Result Value Ref Range   WBC 11.0 (H) 4.0 - 10.5 K/uL   RBC 3.17 (L) 4.22 - 5.81 MIL/uL   Hemoglobin 8.6 (L) 13.0 - 17.0 g/dL   HCT 27.8 (L) 39.0 - 52.0 %   MCV 87.7 78.0 - 100.0 fL  MCH 27.1 26.0 - 34.0 pg   MCHC 30.9 30.0 - 36.0 g/dL   RDW 17.6 (H) 11.5 - 15.5 %   Platelets 463 (H) 150 - 400 K/uL  Renal function panel     Status: Abnormal   Collection Time: 02/08/17  8:23 AM  Result Value Ref Range   Sodium 139 135 - 145 mmol/L   Potassium 5.1 3.5 - 5.1 mmol/L   Chloride 103 101 - 111 mmol/L   CO2 21 (L) 22 - 32 mmol/L   Glucose, Bld 90 65 - 99 mg/dL   BUN 79 (H) 6 - 20 mg/dL   Creatinine, Ser 9.32 (H) 0.61 - 1.24 mg/dL   Calcium 7.8 (L) 8.9 - 10.3 mg/dL   Phosphorus 10.3 (H) 2.5 - 4.6 mg/dL   Albumin 2.4 (L) 3.5 - 5.0 g/dL   GFR calc non Af Amer 6 (L) >60 mL/min   GFR calc Af Amer 7 (L) >60 mL/min    Comment: (NOTE) The eGFR has been calculated using the CKD EPI equation. This calculation has not been  validated in all clinical situations. eGFR's persistently <60 mL/min signify possible Chronic Kidney Disease.    Anion gap 15 5 - 15  Lactic acid, plasma     Status: None   Collection Time: 02/08/17  8:24 AM  Result Value Ref Range   Lactic Acid, Venous 0.7 0.5 - 1.9 mmol/L    ROS: see hpi  Physical Exam: Vitals:   02/08/17 0900 02/08/17 0930  BP: 122/71 (!) 153/70  Pulse: 75 76  Resp:    Temp:    SpO2:       General: Obese WM NAD on HD awoken from sleep and slightly somnolent  But OX3  HEENT: [place]  MMM. EOMI , NOnicteric Neck: supple, No JVD Heart: RRR , 1/6 Hsm  lsb , no rub  Lungs: CTA non labored breathing  Abdomen: obese, soft , BS pos. Normal, tender diffusely  No rebound  non distended , PD cath no dc or erythema  Extremities: R AKA  Stump healed, l Pedal edema trace   Skin:no overt rash Neuro: OX3 , slightly Somnolent , but no acute noted focal deficits otherwise  Dialysis Access: L FA AVF  Patent on HD / PD cath   Dialysis Orders: Center: [facility]   on MWF , 4 hrs ,EDW 81.5 (kg), Dialysate 2.0 K, 2.25 Ca,, Left FA   AVF Heparin 2,400    Hectorol 1 mcg IV/HD Mircera 225 mcg  q 2wks ( last on 01/29/17)   Assessment/Plan 1. Abdominal Pain -  Possible Peritonitis from peritoneal  Dialysis catheter ( not in Use with  Patient Delay  in removal) vs Mesenteric ischemia-  have called [facility] Dr. Ninfa Linden notified , Pd cath needs removal not emergent . Getting empiric abx in case of PD cath infection.  2. ESRD -  HD today missed HD yesterday and 8/06  K 5.1 ,no excess volume 3. Hypertension/volume  - bp stable  4. Anemia  - hgb 8.6  ESA  Just given 01/29/17  Fu trend 5. Metabolic bone disease -  Iv Hectorol on hd , Auryxia phos binder and sensipar  6. Non compliance with HD- stressed  again to pt ned for compliance  7. DM type 2 - per admit  8. Depression - on meds per admit   Ernest Haber, PA-C [place] [phone] 02/08/2017, 9:51 AM   Pt seen,  examined and agree w A/P as above.  Kelly Splinter MD [employer] pager [phone]   02/08/2017, 1:25 PM

## 2017-02-09 LAB — BASIC METABOLIC PANEL
ANION GAP: 14 (ref 5–15)
BUN: 68 mg/dL — ABNORMAL HIGH (ref 6–20)
CALCIUM: 8.5 mg/dL — AB (ref 8.9–10.3)
CO2: 27 mmol/L (ref 22–32)
Chloride: 100 mmol/L — ABNORMAL LOW (ref 101–111)
Creatinine, Ser: 7.7 mg/dL — ABNORMAL HIGH (ref 0.61–1.24)
GFR, EST AFRICAN AMERICAN: 9 mL/min — AB (ref >60)
GFR, EST NON AFRICAN AMERICAN: 8 mL/min — AB (ref >60)
GLUCOSE: 128 mg/dL — AB (ref 65–99)
Potassium: 4.4 mmol/L (ref 3.5–5.1)
Sodium: 141 mmol/L (ref 135–145)

## 2017-02-09 MED ORDER — ONDANSETRON HCL 4 MG PO TABS
4.0000 mg | ORAL_TABLET | Freq: Once | ORAL | Status: AC
  Administered 2017-02-09: 4 mg via ORAL
  Filled 2017-02-09: qty 1

## 2017-02-09 MED ORDER — ONDANSETRON HCL 4 MG PO TABS: 4.0000 mg | ORAL_TABLET | Freq: Three times a day (TID) | ORAL | Status: AC | PRN

## 2017-02-09 MED ORDER — VANCOMYCIN HCL IN DEXTROSE 1-5 GM/200ML-% IV SOLN
1000.0000 mg | INTRAVENOUS | Status: DC
  Administered 2017-02-10: 1 g via INTRAVENOUS
  Filled 2017-02-09 (×2): qty 200

## 2017-02-09 NOTE — Progress Notes (Signed)
Patient is having increased confusion since admission.  It was worse after dialysis yesterday.  Patient isn't recognizing family members at times and states "I feel different."  Patient is complaining of nausea, pain and belching at this time.  Patient did have slightly increased Ammonia and ALT/AST labs on admission.  12 lead ekg performed.

## 2017-02-09 NOTE — H&P (Signed)
Pt seems increasingly confused this shift. Unable to tell me where he is or why he is here when able to this AM, calling out to deceased father, unable to recognize family members.  Rest of neuro exam WNL.  Allowed me to put on [place] @3L . Internal Medicine Teaching Service made aware.

## 2017-02-09 NOTE — Progress Notes (Addendum)
Pharmacy Antibiotic Note  Hayden [name] is a 46 y.o. male admitted on 02/07/2017 with concern for peritonitis.  Pharmacy has been consulted for vancomycin/cefepime dosing.   PD catheter removal planned for next week Currently afebrile, WBC = 11 Blood cultures negative to date  HD appears to be on schedule now --> MWF  Plan: Cefepime 1 gram iv Q 24 hours for now in the evening Vancomycin 1 gram iv Q MWF after HD  Follow up LOT Pre-HD vancomycin level if needed  Resume calcitriol and ferric citrate if appropriate?    Height: 5\' 9"  (175.3 cm) Weight: 210 lb 5.1 oz (95.4 kg) IBW/kg (Calculated) : 70.7  Temp (24hrs), Avg:98.8 F (37.1 C), Min:98.1 F (36.7 C), Max:99.7 F (37.6 C)   Recent Labs Lab 02/07/17 1208 02/07/17 1223 02/07/17 1438 02/07/17 2353 02/08/17 0822 02/08/17 0823 02/08/17 0824  WBC 9.4  --   --  8.8 11.0*  --   --   CREATININE 9.01*  --   --  9.03*  --  9.32*  --   LATICACIDVEN  --  1.41 3.14*  --   --   --  0.7    Estimated Creatinine Clearance: 11.5 mL/min (A) (by C-G formula based on SCr of 9.32 mg/dL (H)).    Allergies  Allergen Reactions  . Dilaudid [Hydromorphone Hcl] Other (See Comments)    ABNORMAL BEHAVIORS "VERBALLY AND PHYSICALLY ABUSIVE" PSYCHOSIS  . Morphine And Related Itching and Other (See Comments)  . Adhesive [Tape] Other (See Comments)    Redness from adhesive tape if left on too long, paper tape is preferred   Thank you Anette Guarneri, PharmD [phone] 02/09/2017 11:25 AM

## 2017-02-09 NOTE — Progress Notes (Signed)
   Subjective:  Patient lying comfortably in bed this morning. Sits up in bed upon entering the room and has complaints of abdominal pain and 'feeling like crap.' He is alert but still appears somnolent and groggy. Have to ask questions several time before getting a response. His only complaint is diffuse abdominal pain. Reports tolerating food well; pain not worse with eating but rather is constant. He denies any nausea or vomiting. He reports diarrhea but wife at bedside reports he has not had a bowel movement.   Objective:  Vital signs in last 24 hours: Vitals:   02/09/17 0400 02/09/17 0500 02/09/17 0600 02/09/17 0700  BP:      Pulse: 78 75 75 73  Resp: 16 14 (!) 21 16  Temp:      TempSrc:      SpO2: 98% 96% (!) 82% 91%  Weight:      Height:       General: alert, awake but somnolent, sitting up in bed in no distress Lungs: CTAB Heart: normal rate, regular rhythm, loud systolic murmur heard best at RUSB Abdomen: soft, diffusely tender to palpation, non-distended, hypoactive bowel sounds, no rebound or guarding, PD catheter in place without surrounding erythema or sings of infection. Patient is diffusely warm to the touch.  Extremities: 1+ LLE edema, R BKA  Assessment/Plan:  Abdominal Pain, likely secondary to peritonitis from peritoneal catheter Appears to have missed appointments to have his PD catheter removed as well as failed to have keep appointments for PD catheter care arranged at his kidney center after switching from PD to HD in June. Clinically stable. Lactic acidosis has resolved. Remains afebrile with normal vital signs. He continues to be somewhat confused and groggy on exam. Question if some of this is medication effect as he is on numerous sedating medications for his various medical problems. Wife has requested we do not make any changes to his medication regimen while he is here. Continues to have diffuse abdominal pain. Blood cultures with NGTD. Surgery consulted, plan  to remove catheter 8/13 or 8/14 pending OR availability. Continuing empiric antibiotics. Mesenteric ischemia remains lower on the differential; he appears to be improving with current treatment.  - Continue empiric vancomycin/cefepime - Nephrology following, appreciate their help - Catheter removal 8/13 or 8/14 - f/u BCx x2 (NG 1 day)  ESRD on HD MWF Had dialysis yesterday. Plan for another session tomorrow. Appears to have missed HD sessions in the past per nephrology. Stressed the importance of making all of his HD sessions.  - Continue calcitriol, ferric citrate, cinacalcet  CHF (last echo 10/2016 with EF 60-65% and grade 2 DD) - continue home labetalol, isosorbide dinitrate, and aspirin  HTN SBP 130-160s.  - continue home clonidine, labetalol, amlodipine, and hydralazine  Probable OSA CPAP ordered but patient not using  Dispo: Anticipated discharge in approximately 1-2 day(s).   Maryellen Pile, MD 02/09/2017, 7:11 AM Pager: [phone]

## 2017-02-09 NOTE — Progress Notes (Signed)
Medicine attending: Clinical status and lab database reviewed with resident physician Dr. Maryellen Pile and I concur with his evaluation and management plan. He remains afebrile on Cefipime and vancomycin.  Persistent diffuse abdominal pain.  He is intermittently confused.  No new lab. Impression: 1.  Peritonitis related to a infected peritoneal dialysis catheter. Stable on antibiotics.  Plan for catheter removal Monday or Tuesday. 2.  End-stage renal disease now on hemodialysis 3.  Type 2 diabetes not currently on glucose lowering drugs or insulin. 4.  History of a bipolar disorder. 5.  Status post right above-the-knee amputation

## 2017-02-09 NOTE — Progress Notes (Signed)
Pts SpO2 continues to drop into mid-high 80s, esp when sleeping. Pt refusing to wear O2 despite education

## 2017-02-09 NOTE — Progress Notes (Addendum)
[facility] Progress Note  Subjective: no c/o's.    Vitals:   02/09/17 0500 02/09/17 0600 02/09/17 0700 02/09/17 0841  BP:    132/60  Pulse: 75 75 73   Resp: 14 (!) 21 16   Temp:    98.6 F (37 C)  TempSrc:    Oral  SpO2: 96% (!) 82% 91%   Weight:      Height:        Inpatient medications: . amLODipine  10 mg Oral Daily  . cinacalcet  60 mg Oral Q breakfast  . cloNIDine  0.2 mg Transdermal Q Fri  . divalproex  500 mg Oral Q12H  . escitalopram  20 mg Oral Daily  . heparin  5,000 Units Subcutaneous Q8H  . hydrALAZINE  100 mg Oral BID  . isosorbide dinitrate  5 mg Oral BID  . labetalol  300 mg Oral BID  . pravastatin  20 mg Oral Daily  . sodium chloride flush  3 mL Intravenous Q12H  . sodium chloride flush  3 mL Intravenous Q12H   . sodium chloride    . ceFEPime (MAXIPIME) IV Stopped (02/08/17 1848)  . [START ON 02/10/2017] vancomycin     sodium chloride, ALPRAZolam, gabapentin, HYDROcodone-acetaminophen, sodium chloride flush  Exam: Alert, no distress No jvd Chest clear bilat RRR 2/6 sem Abd obese, soft ntnd , LUQ PD cath w/ clean exit site GU normal  Ext R AKA stump healed, LLE trace edema Trace dependent flank edema NF, ox 3  Dialysis: MWF NW 4h  81.5 kg  2/2.25 bath  LFA AVF  Hep 2400 - hect 1 ug tiw - mircera 225 every 2wk, last 8/1      Impression: 1  Abd pain - possibly PD cath related, no gross exit infection.  Plan is empiric abx for now and gen surg will take PD cath out Monday or Tuesday according to their notes.  Abd pain better.  2  ESRD on HD 3  Volume - prob needs a higher EDW (has gained a lot of wt back over past 1-2 mos per his wife), so may need higher dry wt, perhaps 87-88kg .  Per bed wts is up 15kg.  Will get standing or hoyer wt today . UF max on HD tomorrow. Still on 4 bp meds so must have extra volume.  4  DM2 5  Bipolar d/o - doing relatively well for him 6  Anemia of ckd - just rec'd op ESA 8/1 7  MBD cont  meds  Plan - as above.  HD tomorrow.    Kelly Splinter MD [facility] pager [phone]   02/09/2017, 12:01 PM    Recent Labs Lab 02/07/17 1208 02/07/17 2353 02/08/17 0823  NA 140 140 139  K 4.8 4.6 5.1  CL 102 104 103  CO2 23 24 21*  GLUCOSE 121* 108* 90  BUN 71* 74* 79*  CREATININE 9.01* 9.03* 9.32*  CALCIUM 8.5* 7.8* 7.8*  PHOS  --   --  10.3*    Recent Labs Lab 02/07/17 1208 02/08/17 0823  AST 11*  --   ALT 11*  --   ALKPHOS 219*  --   BILITOT 0.9  --   PROT 6.4*  --   ALBUMIN 2.8* 2.4*    Recent Labs Lab 02/07/17 1208 02/07/17 2353 02/08/17 0822  WBC 9.4 8.8 11.0*  HGB 9.7* 8.8* 8.6*  HCT 31.4* 29.1* 27.8*  MCV 87.7 86.6 87.7  PLT 532* 486* 463*  Iron/TIBC/Ferritin/ %Sat    Component Value Date/Time   IRON 18 (L) 12/05/2016 2039   TIBC 185 (L) 12/05/2016 2039   FERRITIN 1,444 (H) 11/15/2015 1342   IRONPCTSAT 10 (L) 12/05/2016 2039

## 2017-02-10 DIAGNOSIS — N186 End stage renal disease: Secondary | ICD-10-CM

## 2017-02-10 DIAGNOSIS — I132 Hypertensive heart and chronic kidney disease with heart failure and with stage 5 chronic kidney disease, or end stage renal disease: Secondary | ICD-10-CM

## 2017-02-10 DIAGNOSIS — G9341 Metabolic encephalopathy: Secondary | ICD-10-CM

## 2017-02-10 DIAGNOSIS — Z992 Dependence on renal dialysis: Secondary | ICD-10-CM

## 2017-02-10 DIAGNOSIS — I509 Heart failure, unspecified: Secondary | ICD-10-CM

## 2017-02-10 DIAGNOSIS — I4891 Unspecified atrial fibrillation: Secondary | ICD-10-CM

## 2017-02-10 DIAGNOSIS — R410 Disorientation, unspecified: Secondary | ICD-10-CM

## 2017-02-10 LAB — RENAL FUNCTION PANEL
ALBUMIN: 2.4 g/dL — AB (ref 3.5–5.0)
ANION GAP: 17 — AB (ref 5–15)
BUN: 80 mg/dL — ABNORMAL HIGH (ref 6–20)
CALCIUM: 8.3 mg/dL — AB (ref 8.9–10.3)
CO2: 22 mmol/L (ref 22–32)
CREATININE: 8.4 mg/dL — AB (ref 0.61–1.24)
Chloride: 100 mmol/L — ABNORMAL LOW (ref 101–111)
GFR calc non Af Amer: 7 mL/min — ABNORMAL LOW (ref >60)
GFR, EST AFRICAN AMERICAN: 8 mL/min — AB (ref >60)
GLUCOSE: 89 mg/dL (ref 65–99)
PHOSPHORUS: 11.4 mg/dL — AB (ref 2.5–4.6)
Potassium: 4.8 mmol/L (ref 3.5–5.1)
SODIUM: 139 mmol/L (ref 135–145)

## 2017-02-10 LAB — CBC
HCT: 26.9 % — ABNORMAL LOW (ref 39.0–52.0)
Hemoglobin: 8.3 g/dL — ABNORMAL LOW (ref 13.0–17.0)
MCH: 26.8 pg (ref 26.0–34.0)
MCHC: 30.9 g/dL (ref 30.0–36.0)
MCV: 86.8 fL (ref 78.0–100.0)
Platelets: 507 K/uL — ABNORMAL HIGH (ref 150–400)
RBC: 3.1 MIL/uL — ABNORMAL LOW (ref 4.22–5.81)
RDW: 17.1 % — ABNORMAL HIGH (ref 11.5–15.5)
WBC: 11.4 K/uL — ABNORMAL HIGH (ref 4.0–10.5)

## 2017-02-10 MED ORDER — LIDOCAINE HCL (PF) 1 % IJ SOLN: 5.0000 mL | INTRAMUSCULAR | Status: DC | PRN

## 2017-02-10 MED ORDER — PENTAFLUOROPROP-TETRAFLUOROETH EX AERO: 1.0000 "application " | INHALATION_SPRAY | CUTANEOUS | Status: DC | PRN

## 2017-02-10 MED ORDER — ALTEPLASE 2 MG IJ SOLR: 2.0000 mg | Freq: Once | INTRAMUSCULAR | Status: DC | PRN

## 2017-02-10 MED ORDER — VANCOMYCIN HCL IN DEXTROSE 1-5 GM/200ML-% IV SOLN
INTRAVENOUS | Status: AC
  Administered 2017-02-10: 1 g via INTRAVENOUS
  Filled 2017-02-10: qty 200

## 2017-02-10 MED ORDER — DARBEPOETIN ALFA 100 MCG/0.5ML IJ SOSY
PREFILLED_SYRINGE | INTRAMUSCULAR | Status: AC
  Administered 2017-02-10: 100 ug via INTRAVENOUS
  Filled 2017-02-10: qty 0.5

## 2017-02-10 MED ORDER — RENA-VITE PO TABS
1.0000 | ORAL_TABLET | Freq: Every day | ORAL | Status: DC
  Administered 2017-02-10 – 2017-02-16 (×5): 1 via ORAL
  Filled 2017-02-10 (×8): qty 1

## 2017-02-10 MED ORDER — DARBEPOETIN ALFA 100 MCG/0.5ML IJ SOSY
100.0000 ug | PREFILLED_SYRINGE | INTRAMUSCULAR | Status: DC
  Administered 2017-02-10 – 2017-02-17 (×2): 100 ug via INTRAVENOUS
  Filled 2017-02-10: qty 0.5

## 2017-02-10 MED ORDER — HEPARIN SODIUM (PORCINE) 1000 UNIT/ML DIALYSIS: 1000.0000 [IU] | INTRAMUSCULAR | Status: DC | PRN

## 2017-02-10 MED ORDER — FERRIC CITRATE 1 GM 210 MG(FE) PO TABS
420.0000 mg | ORAL_TABLET | Freq: Three times a day (TID) | ORAL | Status: DC
  Administered 2017-02-10 – 2017-02-16 (×9): 420 mg via ORAL
  Filled 2017-02-10 (×22): qty 2

## 2017-02-10 MED ORDER — SODIUM CHLORIDE 0.9 % IV SOLN: 100.0000 mL | INTRAVENOUS | Status: DC | PRN

## 2017-02-10 MED ORDER — SEVELAMER CARBONATE 800 MG PO TABS
800.0000 mg | ORAL_TABLET | Freq: Three times a day (TID) | ORAL | Status: DC
  Filled 2017-02-10 (×3): qty 1

## 2017-02-10 MED ORDER — HEPARIN SODIUM (PORCINE) 1000 UNIT/ML DIALYSIS: 2400.0000 [IU] | Freq: Once | INTRAMUSCULAR | Status: DC

## 2017-02-10 MED ORDER — LIDOCAINE-PRILOCAINE 2.5-2.5 % EX CREA: 1.0000 "application " | TOPICAL_CREAM | CUTANEOUS | Status: DC | PRN

## 2017-02-10 NOTE — Progress Notes (Signed)
OT Cancellation    02/10/17 0800  OT Visit Information  Last OT Received On 02/10/17  Reason Eval/Treat Not Completed Patient at procedure or test/ unavailable (HD)  [facility], OT/L  [phone] 02/10/2017

## 2017-02-10 NOTE — Progress Notes (Signed)
Re evaluated Hayden [name] due to concern for increased confusion/ desaturations when pt removes nasal canula after pt returning from dialysis.  Pt alert on exam asking for water to drink, not oriented to place, time, circumstance, perseverating on wanting water, thought blocking,  when pt is asked questions.  Pt ventilating well, 98% on 3L [place], mild upper airway expiratory wheezing,  Likely pt has sleep apnea, CPAP ordered and will try again to have pt use.  Pt diffusely tender to palpation on abdominal exam.  Pt afebrile, normotensive, RR 18  Pts confusion is likely multifactorial, pt has what is almost certainly bacterial peritonitis with no source control yet, he is on multiple psychoactive meds, prior episodes of cerebral damage.  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  BMP was repeated, did not show worsening BUN or other metabolic derangements, Ammonia level not indicated at this time as liver enzymes were not elevated on initial labs and initial ammonia only slightly elevated in the setting of pt being ESRD missing dialysis when admitted.

## 2017-02-10 NOTE — Progress Notes (Signed)
[facility] Progress Note   Subjective: seen prior to HD. Disoriented, does not recognize me. Not answering questions appropriately. Mittens in place to prevent dislodging needles. Confused and combative during night. Not C/O abdominal pain at present.   Objective Vitals:   02/10/17 0400 02/10/17 0407 02/10/17 0500 02/10/17 0739  BP:    128/71  Pulse: (!) 154  78 77  Resp: 18  18 17   Temp:      TempSrc:      SpO2: (!) 85%  92% (!) 89%  Weight:  94.6 kg (208 lb 8.9 oz)    Height:       Physical Exam General: Chronically ill appearing male in NAD Heart: U1,L2 2/6 systolic M 3rd ICS LSB No JVD  Lungs: CTAB A/P. Not wearing O2.  Abdomen: Active BS PD cath LUQ abd site unremarkable Extremities: R AKA trace stump edema. No LLE edema.  Neuro: somnolent. Not consistently answering questions.  Dialysis Access: LFA AVF cannulated  Additional Objective Labs: Basic Metabolic Panel:  Recent Labs Lab 02/07/17 2353 02/08/17 0823 02/09/17 2104  NA 140 139 141  K 4.6 5.1 4.4  CL 104 103 100*  CO2 24 21* 27  GLUCOSE 108* 90 128*  BUN 74* 79* 68*  CREATININE 9.03* 9.32* 7.70*  CALCIUM 7.8* 7.8* 8.5*  PHOS  --  10.3*  --    Liver Function Tests:  Recent Labs Lab 02/07/17 1208 02/08/17 0823  AST 11*  --   ALT 11*  --   ALKPHOS 219*  --   BILITOT 0.9  --   PROT 6.4*  --   ALBUMIN 2.8* 2.4*    Recent Labs Lab 02/07/17 1208  LIPASE 25   CBC:  Recent Labs Lab 02/07/17 1208 02/07/17 2353 02/08/17 0822  WBC 9.4 8.8 11.0*  HGB 9.7* 8.8* 8.6*  HCT 31.4* 29.1* 27.8*  MCV 87.7 86.6 87.7  PLT 532* 486* 463*   Blood Culture    Component Value Date/Time   SDES BLOOD RIGHT FOREARM 02/07/2017 1625   SPECREQUEST IN PEDIATRIC BOTTLE Blood Culture adequate volume 02/07/2017 1625   CULT NO GROWTH 2 DAYS 02/07/2017 1625   REPTSTATUS PENDING 02/07/2017 1625    Cardiac Enzymes: No results for input(s): CKTOTAL, CKMB, CKMBINDEX, TROPONINI in the last 168  hours. CBG:  Recent Labs Lab 02/08/17 2123  GLUCAP 122*   Iron Studies: No results for input(s): IRON, TIBC, TRANSFERRIN, FERRITIN in the last 72 hours. @lablastinr3 @ Studies/Results: Dg Chest Port 1 View  Result Date: 02/08/2017 CLINICAL DATA:  Shortness of breath for 1 week. EXAM: PORTABLE CHEST 1 VIEW COMPARISON:  One-view chest x-ray 11/06/2016 FINDINGS: The heart is enlarged. Mild edema is present. Bibasilar airspace disease likely reflects atelectasis. No definite effusions are present. Aortic atherosclerosis is present at the distal arch and descending aorta. IMPRESSION: 1. Cardiomegaly and mild edema consistent with congestive heart failure. 2. Low lung volumes with mild bibasilar airspace disease, likely reflecting atelectasis. 3. Aortic atherosclerosis. Electronically Signed   By: San Morelle M.D.   On: 02/08/2017 15:57   Medications: . sodium chloride    . ceFEPime (MAXIPIME) IV Stopped (02/09/17 1853)  . vancomycin     . amLODipine  10 mg Oral Daily  . cinacalcet  60 mg Oral Q breakfast  . cloNIDine  0.2 mg Transdermal Q Fri  . divalproex  500 mg Oral Q12H  . escitalopram  20 mg Oral Daily  . heparin  5,000 Units Subcutaneous Q8H  . hydrALAZINE  100 mg Oral BID  . isosorbide dinitrate  5 mg Oral BID  . labetalol  300 mg Oral BID  . pravastatin  20 mg Oral Daily  . sodium chloride flush  3 mL Intravenous Q12H  . sodium chloride flush  3 mL Intravenous Q12H     Dialysis Orders: Center: [facility]   on MWF , 4 hrs ,EDW 81.5 (kg), Dialysate 2.0 K, 2.25 Ca,, Left FA   AVF Heparin 2,400    Hectorol 1 mcg IV/HD Mircera 225 mcg  q 2wks ( last on 01/29/17)   Assessment/Plan 1. Abdominal Pain -  Possible Peritonitis from peritoneal  Dialysis catheter ( not in Use with  Patient Delay  in removal) vs Mesenteric ischemia-  have called [facility] Dr. Ninfa Linden notified , Pd cath pout today or Tuesday. Vanc and cefepime per primary. WBC 11.0. Afebrile during night.  2. ESRD -   HD today on schedule. Labs pending.  3. Hypertension/volume  - Blood pressure controlled. Last HD 02/08/17 Pre wt 96.8 kg Net UF 3 liters Post wt 93.5. Wt today 95.2 Attempting UFG 5 liters however he was not getting to EDW as OP even when he ran full treatments. Attempt total UFG 4 liters. Adjust EDW.  4. Anemia  - hgb 8.6  ESA  Just given 01/29/17 Labs pending.  5. Metabolic bone disease -  Ca 8.5 C Ca 9.78 Continue binders, sensipar/  6. Non compliance with HD- ongoing issue made worse by depression/anxiety.  7. DM type 2 - per primary  8. Depression - on meds per admit   Rita H. Brown NP-C 02/10/2017, 8:18 AM  [employer] [phone] I have seen and examined this patient and agree with plan per Juanell Fairly.  Pt seen on HD. Using 16g needles and BFR 350.  Pt somnolent but arousable though confused.  Needs to get PD cath out.  Resume aranesp and start renavite Kayani Rapaport T,MD 02/10/2017 9:11 AM

## 2017-02-10 NOTE — Progress Notes (Signed)
Returned from dialysis in stable condition, resting at this time in no visible distress. MD paged to notify wife is requesting update on POC. Call back received and MD calling into pt's room to give update. Will continue to monitor.

## 2017-02-10 NOTE — Progress Notes (Signed)
Patient still confused and combative and dialysis report called. Patient given anxiety medications.

## 2017-02-10 NOTE — Progress Notes (Signed)
[facility] Progress Note     Subjective: CC:  Pt in HD. Briefly opens eyes to voice and sternal rub, does not arouse to verbalize or answer questions.  Objective: Vital signs in last 24 hours: Temp:  [97.8 F (36.6 C)-98.9 F (37.2 C)] 98.2 F (36.8 C) (08/13 0811) Pulse Rate:  [61-154] 74 (08/13 0930) Resp:  [12-25] 24 (08/13 0930) BP: (114-163)/(56-99) 139/99 (08/13 0930) SpO2:  [75 %-100 %] 100 % (08/13 0900) Weight:  [94 kg (207 lb 4.8 oz)-94.6 kg (208 lb 8.9 oz)] 94.6 kg (208 lb 8.9 oz) (08/13 0407) Last BM Date: 02/09/17  Intake/Output from previous day: 08/12 0701 - 08/13 0700 In: 695 [P.O.:625; I.V.:20; IV Piggyback:50] Out: 0  Intake/Output this shift: No intake/output data recorded.  PE: Gen:  Alert, NAD, pleasant HEENT: pupils equal, anicteric sclerae  Card:  Regular rate and rhythm, murmur present Pulm:  Normal effort, clear to auscultation bilaterally Abd: Soft, obese, mild TTP, non-distended, bowel sounds present Skin: warm and dry, no rashes  Psych: A&Ox3   Lab Results:   Recent Labs  02/08/17 0822 02/10/17 0825  WBC 11.0* 11.4*  HGB 8.6* 8.3*  HCT 27.8* 26.9*  PLT 463* 507*   BMET  Recent Labs  02/09/17 2104 02/10/17 0825  NA 141 139  K 4.4 4.8  CL 100* 100*  CO2 27 22  GLUCOSE 128* 89  BUN 68* 80*  CREATININE 7.70* 8.40*  CALCIUM 8.5* 8.3*   PT/INR No results for input(s): LABPROT, INR in the last 72 hours. CMP     Component Value Date/Time   NA 139 02/10/2017 0825   K 4.8 02/10/2017 0825   CL 100 (L) 02/10/2017 0825   CO2 22 02/10/2017 0825   GLUCOSE 89 02/10/2017 0825   BUN 80 (H) 02/10/2017 0825   CREATININE 8.40 (H) 02/10/2017 0825   CALCIUM 8.3 (L) 02/10/2017 0825   PROT 6.4 (L) 02/07/2017 1208   ALBUMIN 2.4 (L) 02/10/2017 0825   AST 11 (L) 02/07/2017 1208   ALT 11 (L) 02/07/2017 1208   ALKPHOS 219 (H) 02/07/2017 1208   BILITOT 0.9 02/07/2017 1208   GFRNONAA 7 (L) 02/10/2017 0825   GFRAA 8 (L)  02/10/2017 0825   Lipase     Component Value Date/Time   LIPASE 25 02/07/2017 1208       Studies/Results: Dg Chest Port 1 View  Result Date: 02/08/2017 CLINICAL DATA:  Shortness of breath for 1 week. EXAM: PORTABLE CHEST 1 VIEW COMPARISON:  One-view chest x-ray 11/06/2016 FINDINGS: The heart is enlarged. Mild edema is present. Bibasilar airspace disease likely reflects atelectasis. No definite effusions are present. Aortic atherosclerosis is present at the distal arch and descending aorta. IMPRESSION: 1. Cardiomegaly and mild edema consistent with congestive heart failure. 2. Low lung volumes with mild bibasilar airspace disease, likely reflecting atelectasis. 3. Aortic atherosclerosis. Electronically Signed   By: San Morelle M.D.   On: 02/08/2017 15:57    Anti-infectives: Anti-infectives    Start     Dose/Rate Route Frequency Ordered Stop   02/10/17 1200  vancomycin (VANCOCIN) IVPB 1000 mg/200 mL premix     1,000 mg 200 mL/hr over 60 Minutes Intravenous Every [name]-F (Hemodialysis) 02/09/17 1128     02/08/17 1800  ceFEPIme (MAXIPIME) 1 g in dextrose 5 % 50 mL IVPB     1 g 100 mL/hr over 30 Minutes Intravenous Every 24 hours 02/08/17 1041     02/08/17 1059  vancomycin (VANCOCIN) 1-5 GM/200ML-% IVPB  Comments:  Ashley Akin   : cabinet override      02/08/17 1059 02/08/17 1112   02/08/17 1045  vancomycin (VANCOCIN) IVPB 1000 mg/200 mL premix     1,000 mg 200 mL/hr over 60 Minutes Intravenous To Hemodialysis 02/08/17 1041 02/08/17 1246   02/07/17 1715  ceFEPIme (MAXIPIME) 1 g in dextrose 5 % 50 mL IVPB     1 g 100 mL/hr over 30 Minutes Intravenous  Once 02/07/17 1700 02/07/17 1818   02/07/17 1715  vancomycin (VANCOCIN) IVPB 750 mg/150 ml premix     750 mg 150 mL/hr over 60 Minutes Intravenous  Once 02/07/17 1704 02/07/17 1845   02/07/17 1515  vancomycin (VANCOCIN) IVPB 1000 mg/200 mL premix  Status:  Discontinued     1,000 mg 200 mL/hr over 60 Minutes Intravenous   Once 02/07/17 1503 02/07/17 1740   02/07/17 1515  piperacillin-tazobactam (ZOSYN) IVPB 3.375 g  Status:  Discontinued     3.375 g 100 mL/hr over 30 Minutes Intravenous  Once 02/07/17 1503 02/07/17 1644     Assessment/Plan PD catheter not properly functioning with peritonitis - afebrile, VSS, WBC 11.4 - Now on HD - re-ordered renal diet w/ fluid restriction; will make NPO after MN for likely PD cath removal tomorrow by Dr. Corrie Mckusick.  - POA not listed on pt file, per notes the patients wife is at bedside on occasion.   FEN: renal w/ fluid restriction, NPO after MN  ID: vanc/cefepime  VTE: SCD's, SQ heparin  Plan: PD cath removal in OR tomorrow    LOS: 3 days    Jill Alexanders , [facility] 02/10/2017, 9:39 AM Pager: [phone] Consults: [phone] Mon-Fri 7:00 am-4:30 pm Sat-Sun 7:00 am-11:30 am

## 2017-02-10 NOTE — Progress Notes (Signed)
   Subjective:  Patient seen in HD unit this morning. Sleeping comfortably in bed. Still very somnolent throughout the day. Wakes up to answer questions but falls back asleep quickly. Has complaints of abdominal pain but falls asleep easily. No other complaints. CPAP ordered but patient is not using it at night  Was confused and combative overnight. No clear etiology - may be multifactorial from infection with no source control, multiple psychoactive medications, possibly uremic, and prior episodes of cerebral damage. Will continue to monitor.  HD today, OR tomorrow for PD cath removal.  Objective:  Vital signs in last 24 hours: Vitals:   02/10/17 0830 02/10/17 0900 02/10/17 0930 02/10/17 1000  BP: 114/68 137/86 (!) 139/99 123/70  Pulse: 69 74 74 73  Resp: 15 13 (!) 24 18  Temp:      TempSrc:      SpO2: 100% 100%  100%  Weight:      Height:       General: obese white male sleeping in HD bed in NAD; wakes up to questioning but falls back asleep quickly Lungs: CTAB, no wheezes Heart: NR & RR; loud systolic murmur heard best at RUSB Abdomen: soft, nontender to palpation, non-distended, normal bowel sounds, no rebound or guarding. PD catheter in place without surrounding erythema Extremities: 1+ LLE edema, R AKA  Assessment/Plan:  Abdominal Pain, likely secondary to peritonitis from peritoneal catheter Appears to have missed appointments to have his PD catheter removed as well as failed to have keep appointments for PD catheter care arranged at his kidney center after switching from PD to HD in June. Clinically stable and perhaps improved abdominal pain. Remains afebrile with normal vital signs. He continues to be somewhat confused and groggy on exam. Question if some of this is medication effect as he is on numerous sedating medications for his various medical problems. Wife has requested we do not make any changes to his medication regimen while he is here. - Continue empiric  vancomycin/cefepime - Nephrology following, appreciate their help - General surgery consulted, appreciate their assistance - PD catheter removal 8/14 - NPO at midnight - BCx NGTD  Confusion Likely multifactorial - peritonitis with no source control, multiple psychoactive medications, prior episodes of cerebral damage, possibly uremic. Wife has requested we do not make any changes to his medication regimen while he is here. BMP normal. BUN 80 (71 on presentation). No other metabolic derangements. 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 - Continue to monitor - HD today - PD catheter removal 8/14 - Empiric vanc/cefepime - CPAP if tolerated  ESRD on HD MWF Appears to have missed HD sessions in the past per nephrology. Weight 208lbs this morning. - HD today - Continue calcitriol, ferric citrate, cinacalcet - Added Renvela 800mg  TID today (P 11.4)  CHF (last echo 10/2016 with EF 60-65% and grade 2 DD) - continue home labetalol, isosorbide dinitrate, and aspirin  HTN sBP 130s - continue home clonidine, labetalol, amlodipine, and hydralazine  Probable OSA CPAP ordered but patient not using  Dispo: Anticipated discharge in approximately 1-2 day(s).   Colbert Ewing, MD  Internal Medicine, PGY-1 02/10/2017, 10:44 AM P [phone]

## 2017-02-10 NOTE — Plan of Care (Signed)
Problem: Safety: Goal: Ability to remain free from injury will improve Outcome: Progressing Disxcuissed with patient and wife about surgical consent with limit teach back displayed by the patient but with had full teach back displayed  Comments: Patient continues to remove oxygen and has increased confusion

## 2017-02-10 NOTE — Progress Notes (Signed)
PT Cancellation Note  Patient Details Name: Hayden [name] MRN: [id number] DOB: [birth year]   Cancelled Treatment:    Reason Eval/Treat Not Completed: Patient at procedure or test/unavailable Pt off floor at HD. Will follow up as time allows.   [place] 02/10/2017, 8:13 AM Wray Kearns, PT, DPT [phone]

## 2017-02-10 NOTE — Plan of Care (Signed)
Problem: Education: Goal: Knowledge of [facility] Education information/materials will improve Outcome: Progressing Discussed with patient importance of keeping oxygen saturations levels >90-92% with no teach back displayed.  Comments: Patient continues to have increased confusion and no Ammonia levels checked at this time which was previous discussed with on-call MD.  Possible needs a blood gas.

## 2017-02-10 NOTE — Progress Notes (Signed)
Internal Medicine Attending  Date: 02/10/2017  Patient name: Hayden [name] Medical record number: [id number] Date of birth: [birth year] Age: 46 y.o. Gender: male  I saw and evaluated the patient. I reviewed the resident's note by Dr. Ronalee Red and I agree with the resident's findings and plans as documented in her progress note.  When seen on rounds in hemodialysis this morning he remained somnolent but arousable. Abdominal exam was soft, nontender, with active bowel sounds. There was no rebound or guarding. He is tentatively on the OR schedule for removal of his peritoneal dialysis catheter tomorrow. In the meantime, we will continue with supportive care including antibiotics.

## 2017-02-11 ENCOUNTER — Inpatient Hospital Stay (HOSPITAL_COMMUNITY): Payer: Medicare Other | Admitting: Anesthesiology

## 2017-02-11 ENCOUNTER — Inpatient Hospital Stay (HOSPITAL_COMMUNITY): Payer: Medicare Other

## 2017-02-11 ENCOUNTER — Encounter (HOSPITAL_COMMUNITY)
Admission: EM | Disposition: A | Discharge status: Home / Self Care | Payer: Self-pay | Source: Home / Self Care | Attending: Internal Medicine

## 2017-02-11 ENCOUNTER — Encounter (HOSPITAL_COMMUNITY): Payer: Self-pay | Admitting: Certified Registered Nurse Anesthetist

## 2017-02-11 DIAGNOSIS — J9601 Acute respiratory failure with hypoxia: Secondary | ICD-10-CM

## 2017-02-11 HISTORY — PX: CAPD REMOVAL: SHX5234

## 2017-02-11 LAB — SURGICAL PCR SCREEN
MRSA, PCR: NEGATIVE
Staphylococcus aureus: POSITIVE — AB

## 2017-02-11 LAB — GLUCOSE, CAPILLARY
GLUCOSE-CAPILLARY: 60 mg/dL — AB (ref 65–99)
GLUCOSE-CAPILLARY: 80 mg/dL (ref 65–99)
Glucose-Capillary: 58 mg/dL — ABNORMAL LOW (ref 65–99)
Glucose-Capillary: 103 mg/dL — ABNORMAL HIGH (ref 65–99)
Glucose-Capillary: 81 mg/dL (ref 65–99)

## 2017-02-11 LAB — RENAL FUNCTION PANEL
ANION GAP: 13 (ref 5–15)
Albumin: 2.4 g/dL — ABNORMAL LOW (ref 3.5–5.0)
BUN: 45 mg/dL — AB (ref 6–20)
CHLORIDE: 97 mmol/L — AB (ref 101–111)
CO2: 27 mmol/L (ref 22–32)
Calcium: 8.1 mg/dL — ABNORMAL LOW (ref 8.9–10.3)
Creatinine, Ser: 5.8 mg/dL — ABNORMAL HIGH (ref 0.61–1.24)
GFR calc Af Amer: 12 mL/min — ABNORMAL LOW (ref >60)
GFR calc non Af Amer: 11 mL/min — ABNORMAL LOW (ref >60)
GLUCOSE: 73 mg/dL (ref 65–99)
POTASSIUM: 4.3 mmol/L (ref 3.5–5.1)
Phosphorus: 8.9 mg/dL — ABNORMAL HIGH (ref 2.5–4.6)
Sodium: 137 mmol/L (ref 135–145)

## 2017-02-11 LAB — POCT I-STAT 3, ART BLOOD GAS (G3+)
Acid-Base Excess: 3 mmol/L — ABNORMAL HIGH (ref 0.0–2.0)
BICARBONATE: 28.2 mmol/L — AB (ref 20.0–28.0)
O2 SAT: 100 %
PCO2 ART: 43.9 mmHg (ref 32.0–48.0)
TCO2: 30 mmol/L (ref 0–100)
pH, Arterial: 7.415 (ref 7.350–7.450)
pO2, Arterial: 302 mmHg — ABNORMAL HIGH (ref 83.0–108.0)

## 2017-02-11 LAB — CBC
HEMATOCRIT: 29.6 % — AB (ref 39.0–52.0)
HEMOGLOBIN: 8.7 g/dL — AB (ref 13.0–17.0)
MCH: 25.9 pg — ABNORMAL LOW (ref 26.0–34.0)
MCHC: 29.4 g/dL — AB (ref 30.0–36.0)
MCV: 88.1 fL (ref 78.0–100.0)
Platelets: 574 10*3/uL — ABNORMAL HIGH (ref 150–400)
RBC: 3.36 MIL/uL — ABNORMAL LOW (ref 4.22–5.81)
RDW: 17.1 % — AB (ref 11.5–15.5)
WBC: 10.8 10*3/uL — AB (ref 4.0–10.5)

## 2017-02-11 SURGERY — CONTINUOUS AMBULATORY PERITONEAL DIALYSIS  (CAPD) CATHETER REMOVAL
Anesthesia: General | Site: Abdomen

## 2017-02-11 MED ORDER — PROPOFOL 10 MG/ML IV BOLUS
INTRAVENOUS | Status: DC | PRN
  Administered 2017-02-11: 100 mg via INTRAVENOUS
  Administered 2017-02-11: 50 mg via INTRAVENOUS

## 2017-02-11 MED ORDER — BUPIVACAINE-EPINEPHRINE (PF) 0.25% -1:200000 IJ SOLN
INTRAMUSCULAR | Status: AC
  Filled 2017-02-11: qty 30

## 2017-02-11 MED ORDER — LIDOCAINE 2% (20 MG/ML) 5 ML SYRINGE
INTRAMUSCULAR | Status: DC | PRN
  Administered 2017-02-11: 80 mg via INTRAVENOUS

## 2017-02-11 MED ORDER — PROPOFOL 10 MG/ML IV BOLUS
INTRAVENOUS | Status: AC
  Filled 2017-02-11: qty 20

## 2017-02-11 MED ORDER — MIDAZOLAM HCL 2 MG/2ML IJ SOLN
INTRAMUSCULAR | Status: AC
  Filled 2017-02-11: qty 2

## 2017-02-11 MED ORDER — DEXTROSE-NACL 5-0.9 % IV SOLN
INTRAVENOUS | Status: DC
  Administered 2017-02-11: 18:00:00 via INTRAVENOUS
  Administered 2017-02-12: 100 mL/h via INTRAVENOUS

## 2017-02-11 MED ORDER — NICOTINE 14 MG/24HR TD PT24
14.0000 mg | MEDICATED_PATCH | Freq: Every day | TRANSDERMAL | Status: DC
  Administered 2017-02-12 – 2017-02-15 (×4): 14 mg via TRANSDERMAL
  Filled 2017-02-11 (×7): qty 1

## 2017-02-11 MED ORDER — ASPIRIN 81 MG PO CHEW
81.0000 mg | CHEWABLE_TABLET | Freq: Every day | ORAL | Status: DC
  Administered 2017-02-11 – 2017-02-14 (×3): 81 mg
  Filled 2017-02-11 (×3): qty 1

## 2017-02-11 MED ORDER — FENTANYL CITRATE (PF) 100 MCG/2ML IJ SOLN: 100.0000 ug | INTRAMUSCULAR | Status: DC | PRN

## 2017-02-11 MED ORDER — CALCITRIOL 0.5 MCG PO CAPS
0.5000 ug | ORAL_CAPSULE | Freq: Every day | ORAL | Status: DC
  Filled 2017-02-11: qty 1

## 2017-02-11 MED ORDER — CHLORHEXIDINE GLUCONATE CLOTH 2 % EX PADS
6.0000 | MEDICATED_PAD | Freq: Every day | CUTANEOUS | Status: DC
  Administered 2017-02-11 – 2017-02-12 (×2): 6 via TOPICAL

## 2017-02-11 MED ORDER — FENTANYL CITRATE (PF) 100 MCG/2ML IJ SOLN
INTRAMUSCULAR | Status: DC | PRN
  Administered 2017-02-11 (×5): 50 ug via INTRAVENOUS

## 2017-02-11 MED ORDER — FENTANYL CITRATE (PF) 100 MCG/2ML IJ SOLN
100.0000 ug | INTRAMUSCULAR | Status: DC | PRN
  Administered 2017-02-13 (×2): 100 ug via INTRAVENOUS
  Filled 2017-02-11 (×2): qty 2

## 2017-02-11 MED ORDER — PROPOFOL 1000 MG/100ML IV EMUL
5.0000 ug/kg/min | INTRAVENOUS | Status: DC
  Administered 2017-02-11 (×2): 60 ug/kg/min via INTRAVENOUS
  Administered 2017-02-11: 75 ug/kg/min via INTRAVENOUS
  Administered 2017-02-11: 80 ug/kg/min via INTRAVENOUS
  Administered 2017-02-11: 60 ug/kg/min via INTRAVENOUS
  Administered 2017-02-12 (×2): 70 ug/kg/min via INTRAVENOUS
  Filled 2017-02-11 (×7): qty 100

## 2017-02-11 MED ORDER — DEXTROSE 50 % IV SOLN
INTRAVENOUS | Status: AC
  Administered 2017-02-11: 12:00:00
  Filled 2017-02-11: qty 50

## 2017-02-11 MED ORDER — ONDANSETRON HCL 4 MG/2ML IJ SOLN
INTRAMUSCULAR | Status: DC | PRN
  Administered 2017-02-11: 4 mg via INTRAVENOUS

## 2017-02-11 MED ORDER — SODIUM CHLORIDE 0.9 % IV SOLN
INTRAVENOUS | Status: DC
  Administered 2017-02-11: 10:00:00 via INTRAVENOUS

## 2017-02-11 MED ORDER — VALPROATE SODIUM 500 MG/5ML IV SOLN
500.0000 mg | Freq: Two times a day (BID) | INTRAVENOUS | Status: DC
  Administered 2017-02-11 – 2017-02-12 (×3): 500 mg via INTRAVENOUS
  Filled 2017-02-11 (×4): qty 5

## 2017-02-11 MED ORDER — DOXERCALCIFEROL 4 MCG/2ML IV SOLN
1.0000 ug | INTRAVENOUS | Status: DC
  Administered 2017-02-17: 1 ug via INTRAVENOUS

## 2017-02-11 MED ORDER — ORAL CARE MOUTH RINSE
15.0000 mL | Freq: Four times a day (QID) | OROMUCOSAL | Status: DC
  Administered 2017-02-11 – 2017-02-15 (×8): 15 mL via OROMUCOSAL

## 2017-02-11 MED ORDER — PANTOPRAZOLE SODIUM 40 MG IV SOLR
40.0000 mg | Freq: Every day | INTRAVENOUS | Status: DC
  Administered 2017-02-11 – 2017-02-12 (×2): 40 mg via INTRAVENOUS
  Filled 2017-02-11 (×3): qty 40

## 2017-02-11 MED ORDER — CHLORHEXIDINE GLUCONATE 0.12% ORAL RINSE (MEDLINE KIT)
15.0000 mL | Freq: Two times a day (BID) | OROMUCOSAL | Status: DC
  Administered 2017-02-11 – 2017-02-15 (×5): 15 mL via OROMUCOSAL

## 2017-02-11 MED ORDER — PROPOFOL 1000 MG/100ML IV EMUL
INTRAVENOUS | Status: AC
  Filled 2017-02-11: qty 100

## 2017-02-11 MED ORDER — 0.9 % SODIUM CHLORIDE (POUR BTL) OPTIME
TOPICAL | Status: DC | PRN
  Administered 2017-02-11: 1000 mL

## 2017-02-11 MED ORDER — CHLORHEXIDINE GLUCONATE CLOTH 2 % EX PADS: 6.0000 | MEDICATED_PAD | Freq: Every day | CUTANEOUS | Status: DC

## 2017-02-11 MED ORDER — IPRATROPIUM-ALBUTEROL 0.5-2.5 (3) MG/3ML IN SOLN
3.0000 mL | Freq: Four times a day (QID) | RESPIRATORY_TRACT | Status: DC
  Administered 2017-02-11 – 2017-02-15 (×17): 3 mL via RESPIRATORY_TRACT
  Filled 2017-02-11 (×18): qty 3

## 2017-02-11 MED ORDER — VITAMIN B-1 100 MG PO TABS
100.0000 mg | ORAL_TABLET | Freq: Every day | ORAL | Status: DC
  Administered 2017-02-12 – 2017-02-14 (×2): 100 mg
  Filled 2017-02-11 (×4): qty 1

## 2017-02-11 MED ORDER — MUPIROCIN 2 % EX OINT: 1.0000 "application " | TOPICAL_OINTMENT | Freq: Two times a day (BID) | CUTANEOUS | Status: DC

## 2017-02-11 MED ORDER — DEXTROSE 50 % IV SOLN
INTRAVENOUS | Status: AC
  Administered 2017-02-11: 50 mL
  Filled 2017-02-11: qty 50

## 2017-02-11 MED ORDER — FENTANYL CITRATE (PF) 250 MCG/5ML IJ SOLN
INTRAMUSCULAR | Status: AC
  Filled 2017-02-11: qty 5

## 2017-02-11 MED ORDER — MUPIROCIN 2 % EX OINT
1.0000 "application " | TOPICAL_OINTMENT | Freq: Two times a day (BID) | CUTANEOUS | Status: AC
  Administered 2017-02-11 – 2017-02-15 (×10): 1 via NASAL
  Filled 2017-02-11 (×3): qty 22

## 2017-02-11 MED ORDER — PRAVASTATIN SODIUM 20 MG PO TABS
20.0000 mg | ORAL_TABLET | Freq: Every day | ORAL | Status: DC
  Administered 2017-02-12 – 2017-02-14 (×2): 20 mg
  Filled 2017-02-11 (×3): qty 1

## 2017-02-11 MED ORDER — BUPIVACAINE-EPINEPHRINE 0.25% -1:200000 IJ SOLN
INTRAMUSCULAR | Status: DC | PRN
  Administered 2017-02-11: 30 mL

## 2017-02-11 MED ORDER — ALBUTEROL SULFATE (2.5 MG/3ML) 0.083% IN NEBU
2.5000 mg | INHALATION_SOLUTION | Freq: Four times a day (QID) | RESPIRATORY_TRACT | Status: DC
  Administered 2017-02-11: 2.5 mg via RESPIRATORY_TRACT
  Filled 2017-02-11: qty 3

## 2017-02-11 MED ORDER — PROPOFOL 500 MG/50ML IV EMUL
INTRAVENOUS | Status: DC | PRN
  Administered 2017-02-11: 75 ug/kg/min via INTRAVENOUS

## 2017-02-11 MED ORDER — HYDRALAZINE HCL 20 MG/ML IJ SOLN
10.0000 mg | INTRAMUSCULAR | Status: DC | PRN
  Administered 2017-02-11 – 2017-02-12 (×4): 10 mg via INTRAVENOUS
  Filled 2017-02-11 (×4): qty 1

## 2017-02-11 MED ORDER — SUCCINYLCHOLINE CHLORIDE 20 MG/ML IJ SOLN
INTRAMUSCULAR | Status: DC | PRN
  Administered 2017-02-11: 100 mg via INTRAVENOUS

## 2017-02-11 MED ORDER — VITAMIN B-1 100 MG PO TABS: 100.0000 mg | ORAL_TABLET | Freq: Every day | ORAL | Status: DC

## 2017-02-11 SURGICAL SUPPLY — 47 items
ADH SKN CLS APL DERMABOND .7 (GAUZE/BANDAGES/DRESSINGS) ×1
APL SKNCLS STERI-STRIP NONHPOA (GAUZE/BANDAGES/DRESSINGS) ×1
BENZOIN TINCTURE PRP APPL 2/3 (GAUZE/BANDAGES/DRESSINGS) ×2 IMPLANT
BLADE CLIPPER SURG (BLADE) IMPLANT
BLADE SURG 15 STRL LF DISP TIS (BLADE) ×1 IMPLANT
BLADE SURG 15 STRL SS (BLADE) ×6
CANISTER SUCT 3000ML PPV (MISCELLANEOUS) IMPLANT
CLOSURE WOUND 1/2 X4 (GAUZE/BANDAGES/DRESSINGS) ×1
COVER SURGICAL LIGHT HANDLE (MISCELLANEOUS) ×3 IMPLANT
DERMABOND ADVANCED (GAUZE/BANDAGES/DRESSINGS) ×2
DERMABOND ADVANCED .7 DNX12 (GAUZE/BANDAGES/DRESSINGS) ×1 IMPLANT
DRAPE LAPAROSCOPIC ABDOMINAL (DRAPES) ×3 IMPLANT
DRSG TEGADERM 2-3/8X2-3/4 SM (GAUZE/BANDAGES/DRESSINGS) ×2 IMPLANT
ELECT CAUTERY BLADE 6.4 (BLADE) ×3 IMPLANT
ELECT REM PT RETURN 9FT ADLT (ELECTROSURGICAL) ×3
ELECTRODE REM PT RTRN 9FT ADLT (ELECTROSURGICAL) ×1 IMPLANT
GAUZE SPONGE 2X2 8PLY STRL LF (GAUZE/BANDAGES/DRESSINGS) IMPLANT
GAUZE SPONGE 4X4 16PLY XRAY LF (GAUZE/BANDAGES/DRESSINGS) ×3 IMPLANT
GLOVE BIO SURGEON STRL SZ 6 (GLOVE) ×1 IMPLANT
GLOVE BIO SURGEON STRL SZ7 (GLOVE) ×2 IMPLANT
GLOVE BIOGEL PI IND STRL 6.5 (GLOVE) ×1 IMPLANT
GLOVE BIOGEL PI IND STRL 7.5 (GLOVE) IMPLANT
GLOVE BIOGEL PI INDICATOR 6.5 (GLOVE)
GLOVE BIOGEL PI INDICATOR 7.5 (GLOVE) ×2
GOWN STRL REUS W/ TWL LRG LVL3 (GOWN DISPOSABLE) ×2 IMPLANT
GOWN STRL REUS W/TWL LRG LVL3 (GOWN DISPOSABLE) ×6
KIT BASIN OR (CUSTOM PROCEDURE TRAY) ×3 IMPLANT
KIT ROOM TURNOVER OR (KITS) ×3 IMPLANT
NDL HYPO 25GX1X1/2 BEV (NEEDLE) ×1 IMPLANT
NEEDLE HYPO 25GX1X1/2 BEV (NEEDLE) ×3 IMPLANT
NS IRRIG 1000ML POUR BTL (IV SOLUTION) ×3 IMPLANT
PACK SURGICAL SETUP 50X90 (CUSTOM PROCEDURE TRAY) ×3 IMPLANT
PAD ARMBOARD 7.5X6 YLW CONV (MISCELLANEOUS) ×3 IMPLANT
PENCIL BUTTON HOLSTER BLD 10FT (ELECTRODE) ×3 IMPLANT
SPONGE GAUZE 2X2 STER 10/PKG (GAUZE/BANDAGES/DRESSINGS) ×2
STRIP CLOSURE SKIN 1/2X4 (GAUZE/BANDAGES/DRESSINGS) ×1 IMPLANT
SUT MNCRL AB 4-0 PS2 18 (SUTURE) ×3 IMPLANT
SUT VIC AB 0 CT1 27 (SUTURE)
SUT VIC AB 0 CT1 27XBRD ANBCTR (SUTURE) ×1 IMPLANT
SUT VIC AB 3-0 SH 18 (SUTURE) ×3 IMPLANT
SYR BULB 3OZ (MISCELLANEOUS) ×3 IMPLANT
SYR CONTROL 10ML LL (SYRINGE) ×3 IMPLANT
TOWEL OR 17X24 6PK STRL BLUE (TOWEL DISPOSABLE) ×3 IMPLANT
TOWEL OR 17X26 10 PK STRL BLUE (TOWEL DISPOSABLE) ×3 IMPLANT
TUBE CONNECTING 12'X1/4 (SUCTIONS)
TUBE CONNECTING 12X1/4 (SUCTIONS) IMPLANT
YANKAUER SUCT BULB TIP NO VENT (SUCTIONS) ×2 IMPLANT

## 2017-02-11 NOTE — Consult Note (Signed)
PULMONARY / CRITICAL CARE MEDICINE   Name: Hayden [name] MRN: [id number] DOB: 07-Sep-1970    ADMISSION DATE:  02/07/2017 CONSULTATION DATE:  02/11/17  REFERRING MD:  Eppie Gibson  CHIEF COMPLAINT:  VDRF  HISTORY OF PRESENT ILLNESS:  Pt is encephelopathic; therefore, this HPI is obtained from chart review. Hayden [name] is a 46 y.o. 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  Of note, he was switched from PD to HD since admission in June 2018.  He had had multiple appointments at [facility] to flush and remove PD cath; however, has missed all of these.  He presented to [facility] 8/10 with diffuse abdominal pain.  It was felt that he had peritonitis from PD cath.  He was subsequently taken to OR 8/14 for removal of PD cath.  Following procedure, he failed extubation; therefore, was transferred to ICU.   PAST MEDICAL HISTORY :  He  has a past medical history of Anemia (March 2014); Cancer [facility]); CHF (congestive heart failure) ([place]) (2010); Depression; Diabetes mellitus; ESRD on peritoneal dialysis ([place]) (2018); Gangrene ([place]); History of kidney cancer; Hyperlipidemia; Hypertension; Obesity; PONV (postoperative nausea and vomiting); and Stroke ([place]).  PAST SURGICAL HISTORY: He  has a past surgical history that includes Nephrectomy (Right, 2008); Testicle torsion reduction; Hernia repair; Tonsillectomy and adenoidectomy; AV fistula placement (Left, 01/26/2013); Cholecystectomy (11/06/2015); ABDOMINAL AORTOGRAM W/LOWER EXTREMITY (N/A, 10/28/2016); lower extremity angiogram (Right, 10/30/2016); Femoral-popliteal Bypass Graft (Right, 11/05/2016); Vein harvest (Right, 11/05/2016); Amputation (Right, 11/05/2016); and Amputation (Right, 12/04/2016).  Allergies  Allergen Reactions  . Dilaudid [Hydromorphone Hcl] Other (See Comments)    ABNORMAL BEHAVIORS "VERBALLY AND PHYSICALLY ABUSIVE" PSYCHOSIS  . Morphine  And Related Itching and Other (See Comments)  . Adhesive [Tape] Other (See Comments)    Redness from adhesive tape if left on too long, paper tape is preferred    No current facility-administered medications on file prior to encounter.    Current Outpatient Prescriptions on File Prior to Encounter  Medication Sig  . amLODipine (NORVASC) 10 MG tablet Take 1 tablet (10 mg total) by mouth daily.  . cinacalcet (SENSIPAR) 60 MG tablet Take 1 tablet (60 mg total) by mouth daily.  . cloNIDine (CATAPRES - DOSED IN MG/24 HR) 0.2 mg/24hr patch Place 1 patch (0.2 mg total) onto the skin once a week. (Patient taking differently: Place 0.2 mg onto the skin every Friday. )  . divalproex (DEPAKOTE SPRINKLE) 125 MG capsule Take 4 capsules (500 mg total) by mouth every 12 (twelve) hours.  Marland Kitchen escitalopram (LEXAPRO) 10 MG tablet Take 1 tablet (10 mg total) by mouth daily. (Patient taking differently: Take 20 mg by mouth daily. )  . ferric citrate (AURYXIA) 1 GM 210 MG(Fe) tablet Take 2 tablets (420 mg total) by mouth 3 (three) times daily with meals.  . gabapentin (NEURONTIN) 100 MG capsule Take 1 capsule (100 mg total) by mouth 3 (three) times daily.  . hydrALAZINE (APRESOLINE) 100 MG tablet Take 1 tablet (100 mg total) by mouth every 8 (eight) hours. (Patient taking differently: Take 100 mg by mouth 2 (two) times daily. )  . isosorbide dinitrate (ISORDIL) 5 MG tablet Take 1 tablet (5 mg total) by mouth 2 (two) times daily. (Patient taking differently: Take 2.5 mg by mouth 2 (two) times daily. )  . labetalol (NORMODYNE) 300 MG tablet Take 1 tablet (300 mg total) by mouth 2 (two) times daily.  Marland Kitchen  methocarbamol (ROBAXIN) 500 MG tablet Take 1 tablet (500 mg total) by mouth every 6 (six) hours as needed for muscle spasms. (Patient taking differently: Take 500 mg by mouth 3 (three) times daily. )  . multivitamin (RENA-VIT) TABS tablet Take 1 tablet by mouth at bedtime.  Marland Kitchen omeprazole (PRILOSEC) 40 MG capsule Take 1  capsule (40 mg total) by mouth daily.  . pravastatin (PRAVACHOL) 20 MG tablet Take 1 tablet (20 mg total) by mouth daily.  Marland Kitchen aspirin EC 325 MG EC tablet Take 1 tablet (325 mg total) by mouth daily. (Patient not taking: Reported on 02/07/2017)  . calcitRIOL (ROCALTROL) 0.5 MCG capsule Take 1 capsule (0.5 mcg total) by mouth daily. (Patient not taking: Reported on 02/07/2017)  . HYDROcodone-acetaminophen (NORCO) 10-325 MG tablet Take 1-2 tablets by mouth every 6 (six) hours as needed (breakthrough pain). (Patient not taking: Reported on 02/07/2017)  . LORazepam (ATIVAN) 1 MG tablet Take 1 tablet (1 mg total) by mouth every 6 (six) hours as needed for anxiety or sleep. (Patient not taking: Reported on 02/07/2017)    FAMILY HISTORY:  His indicated that his mother is alive. He indicated that his father is deceased. He indicated that his other is alive.    SOCIAL HISTORY: He  reports that he has been smoking Cigarettes.  He has a 2.50 pack-year smoking history. He has never used smokeless tobacco. He reports that he uses drugs, including Marijuana. He reports that he does not drink alcohol.  REVIEW OF SYSTEMS:   Unable to obtain as pt is encephalopathic.  SUBJECTIVE:  On vent, agitated.  VITAL SIGNS: BP 114/62 (BP Location: Right Arm)   Pulse 62   Temp 97.8 F (36.6 C) (Axillary)   Resp 17   Ht 5\' 9"  (1.753 m)   Wt 91.6 kg (201 lb 15.1 oz)   SpO2 96%   BMI 29.82 kg/m   HEMODYNAMICS:    VENTILATOR SETTINGS: Vent Mode: PRVC FiO2 (%):  [100 %] 100 % Set Rate:  [16 bmp] 16 bmp Vt Set:  [530 mL] 530 mL PEEP:  [5 cmH20] 5 cmH20 Plateau Pressure:  [16 cmH20] 16 cmH20  INTAKE / OUTPUT: I/O last 3 completed shifts: In: 680 [P.O.:540; I.V.:40; IV Piggyback:100] Out: 3500 [Other:3500]   PHYSICAL EXAMINATION: General: Adult male, in NAD. Neuro: Sedated on vent, RASS +1, does not follow commands.Marland Kitchen HEENT: [place]/AT. PERRL, sclerae anicteric. Cardiovascular: RRR, 3/6 SEM. Lungs: Respirations  even and unlabored.  CTA bilaterally, No W/R/R. Abdomen: BS x 4, soft, NT/ND. Lower midline abdomen with dressing in place from PD cath removal. Musculoskeletal: No gross deformities, no edema.  Skin: Intact, warm, no rashes.  LABS:  BMET  Recent Labs Lab 02/09/17 2104 02/10/17 0825 02/11/17 0614  NA 141 139 137  K 4.4 4.8 4.3  CL 100* 100* 97*  CO2 27 22 27   BUN 68* 80* 45*  CREATININE 7.70* 8.40* 5.80*  GLUCOSE 128* 89 73    Electrolytes  Recent Labs Lab 02/08/17 0823 02/09/17 2104 02/10/17 0825 02/11/17 0614  CALCIUM 7.8* 8.5* 8.3* 8.1*  PHOS 10.3*  --  11.4* 8.9*    CBC  Recent Labs Lab 02/08/17 0822 02/10/17 0825 02/11/17 0614  WBC 11.0* 11.4* 10.8*  HGB 8.6* 8.3* 8.7*  HCT 27.8* 26.9* 29.6*  PLT 463* 507* 574*    Coag's No results for input(s): APTT, INR in the last 168 hours.  Sepsis Markers  Recent Labs Lab 02/07/17 1223 02/07/17 1438 02/08/17 0824  LATICACIDVEN 1.41 3.14* 0.7  ABG No results for input(s): PHART, PCO2ART, PO2ART in the last 168 hours.  Liver Enzymes  Recent Labs Lab 02/07/17 1208 02/08/17 0823 02/10/17 0825 02/11/17 0614  AST 11*  --   --   --   ALT 11*  --   --   --   ALKPHOS 219*  --   --   --   BILITOT 0.9  --   --   --   ALBUMIN 2.8* 2.4* 2.4* 2.4*    Cardiac Enzymes No results for input(s): TROPONINI, PROBNP in the last 168 hours.  Glucose  Recent Labs Lab 02/08/17 2123  GLUCAP 122*    Imaging Dg Chest Port 1 View  Result Date: 02/11/2017 CLINICAL DATA:  Hypoxia. EXAM: PORTABLE CHEST 1 VIEW COMPARISON:  Radiograph of February 08, 2017. FINDINGS: Stable cardiomegaly. Atherosclerosis of thoracic aorta is noted. Hypoinflation of the lungs is noted with mild bibasilar subsegmental atelectasis. No pneumothorax is noted. Bony thorax is unremarkable. IMPRESSION: Aortic atherosclerosis. Hypoinflation of the lungs with mild bibasilar subsegmental atelectasis. Electronically Signed   By: Marijo Conception,  M.D.   On: 02/11/2017 08:53     STUDIES:  CXR 8/14 > atelectasis.  CULTURES: Blood 8/10 >  Peritoneal fluid 8/14 >   ANTIBIOTICS: Vanc 8/10 >  Cefepime 8/10 >  SIGNIFICANT EVENTS: 8/10 > admit. 8/14 > to OR for removal of PD cath.  LINES/TUBES: ETT 8/14 >   DISCUSSION: 46 y.o. male with ESRD (previously on PD but switched to HD in June), admitted 8/10 with abdominal pain.  Felt to have peritonitis due to infected PD cath.  Taken to OR 8/14 for removal of cath and failed extubation post op.  Returned to ICU on vent.  ASSESSMENT / PLAN:  PULMONARY A: Respiratory insufficiency - failed extubation post op. Atelectasis. P:   Full vent support. Wean as able. VAP prevention measures. SBT in AM for hopeful extubation. Bronchial hygiene. CXR in AM.  CARDIOVASCULAR A:  Hx HTN, HLD, combined heart failure (Echo from May 2018 with EF 60-65%, G2DD). P:  Monitor hemodynamics. Continue clonidine to prevent rebound. D/c amlodipine, hydralazine, labetalol Continue preadmission ASA. Hold preadmission furosemide.  RENAL A:   ESRD. P:   Nephrology following. HD per renal. BMP in AM.  GASTROINTESTINAL A:   GI prophylaxis. Nutrition. P:   SUP: Pantoprazole. NPO.  HEMATOLOGIC / ONCOLOGIC A:   Hx anemia of CKD, RCC s/p partial right nephrectomy. VTE Prophylaxis. P:  Transfuse for Hgb < 7. SCD's / heparin. CBC in AM.  INFECTIOUS A:   Peritonitis - due to infected PD cath. P:   Abx as above (vanc / cefepime).  Follow cultures as above.  ENDOCRINE A:   Hx DM. P:   SSI.  NEUROLOGIC A:   Acute encephalopathy - due to sedation. Hx depression, CVA. P:   Sedation:  Propofol gtt / Fentanyl PRN. RASS goal: 0 to -1. Daily WUA. Continue depakote, change to IV. Continue thiamine. D/c alprazolam, gabapentin, escitalopram, lorazepam.  Family updated: None available.  Interdisciplinary Family Meeting v Palliative Care Meeting:  Due by: 02/17/17.  CC time:  35 min.   Montey Hora, [place] [facility] Pager: [phone]  or [phone] 02/11/2017, 12:00 PM

## 2017-02-11 NOTE — Progress Notes (Signed)
Internal Medicine Attending  Date: 02/11/2017  Patient name: Hayden [name] Medical record number: [id number] Date of birth: [birth year] Age: 46 y.o. Gender: male  I saw and evaluated the patient. I reviewed the resident's note by Dr. Ronalee Red and I agree with the resident's findings and plans as documented in her progress note.  [name] was seen on rounds this morning prior to going to the operating room. At that time, he was somnolent but arousable. He then quickly fell back to sleep. He was without any complaints. Pulmonary exam was notable for poor air movement with a prolonged expiratory phase and scattered wheezing. His breathing pattern was observed for 2 minutes and there may have been some hypopneas but no frank apneas. There was also no abnormal breathing pattern such as Cheyne-Stokes etc. Heart exam revealed a 2/6 systolic murmur unchanged from yesterday. Abdomen was obese, soft, nontender, with active bowel sounds albeit he was somnolent. Left lower extremity was without edema. He is status post a right AKA.  Apparently he desaturated in the postoperative care unit after his peritoneal dialysis catheter was removed. He therefore was not extubated and was instead admitted to the MICU. Please see their note for the updated status. We are more than happy to take him back to our service as soon as he has been successfully extubated and no longer requires ICU level care.

## 2017-02-11 NOTE — Progress Notes (Addendum)
OT Cancellation Note  Patient Details Name: Hayden [name] MRN: [id number] DOB: 1970-07-04   Cancelled Treatment:    Reason Eval/Treat Not Completed: Patient at procedure or test/ unavailable. Pt off unit in OR. Will check back as able.   Norman Herrlich, MS OTR/L  Pager: [place] A Hayden [name] 02/11/2017, 9:50 AM

## 2017-02-11 NOTE — Progress Notes (Signed)
[place] Progress Note Patient Name: Hayden [name] DOB: Jan 19, 1971 MRN: [id number]   Date of Service  02/11/2017  HPI/Events of Note  Multiple issues: 1. Hypoglycemia - Blood glucose = 58 and 2. Hypertension - BP = 177/82.  eICU Interventions  Will order: 1. D5 0.9 NaCl to run IV at 100 mL/hour. 2. Hydralazine 10 mg IV Q 4 hours PRN SBP > 170 or DBP > 100.     Intervention Category Major Interventions: Hypertension - evaluation and management;Other:  Lysle Dingwall 02/11/2017, 5:53 PM

## 2017-02-11 NOTE — Addendum Note (Signed)
Addendum  created 02/11/17 1302 by Verdie Drown, CRNA   Anesthesia Intra Meds edited

## 2017-02-11 NOTE — Op Note (Signed)
Pre-op diagnosis:  Peritonitis secondary to peritoneal dialysis catheter Post-op diagnosis:  Same Procedure:  Removal of peritoneal dialysis catheter Surgeon:  Margaurite Salido K. Anesthesia: Gen. endotracheal Indications:  This is a 46 year old male on dialysis who had a peritoneal dialysis catheter placed about 6 months ago via Psychologist, sport and exercise in [place]. He has developed peritonitis. We are asked to remove the peritoneal dialysis catheter.  Description of procedure: The patient brought to the operating room and placed in supine position on the operating room table. After an adequate level of general anesthesia was obtained, his abdomen was prepped with ChloraPrep and draped sterile fashion. A timeout was taken to ensure the proper patient and proper procedure. We drained as much fluid out of the dialysis catheter is possible. I cut down around his insertion site in the left upper quadrant. We excised the old tract. I dissected down to encountered a small felt cuff around the catheter. We dissected this free. The tubing tracks in the subcutaneous tissues medially to the epigastrium and then inferiorly. There seems to be an insertion site into the peritoneal cavity in the left lower quadrant just off the midline. I a counter incision over this area. We dissected down until we identified the tubing entering the peritoneum. I removed the catheter from the peritoneal cavity. The opening in the fascia was closed with a purse string suture of 0 Vicryl. Just proximal to this area there is a another felt cuff that we released from the underlying tissue. I cut the catheter just proximal to this. There is a another felt cuff that is palpated in the epigastrium. We made another small counter incision over this area and dissected that cuff free. The entire catheter was then removed. No bleeding was noted. All 3 incisions were closed with 3-0 Vicryl and 4-0 Monocryl. Benzoin Steri-Strips were applied. The patient  was then extubated and brought to the recovery room stable condition. All sponge, instrument, and needle counts are correct.  Imogene Burn. Georgette Dover, MD, [facility] Surgery  General/ Trauma Surgery  02/11/2017 11:07 AM

## 2017-02-11 NOTE — Progress Notes (Signed)
PT Cancellation Note  Patient Details Name: Hayden [name] MRN: [id number] DOB: Aug 19, 1970   Cancelled Treatment:    Reason Eval/Treat Not Completed: Patient at procedure or test/unavailable Pt off floor in OR. Will follow up as time allows.   Marguarite Arbour A Deyja Sochacki 02/11/2017, 8:26 AM Wray Kearns, PT, DPT [phone]

## 2017-02-11 NOTE — Progress Notes (Addendum)
Subjective:  No acute events overnight. Sleeping comfortably in bed. Not using CPAP. Still very somnolent and wakes up to answer questions but falls back asleep. Patient has been desaturating to the 80s. When we were in the room, we turned O2 off and he continued to have >95% O2 sats.  Per wife, this is unusual for him and he is usually talkative and interactive. She reports that the change started Saturday.   OR today for PD cath removal.  Objective:  Vital signs in last 24 hours: Vitals:   02/11/17 0400 02/11/17 0500 02/11/17 0600 02/11/17 0747  BP: 124/80 (!) 114/53 (!) 155/64 127/68  Pulse: 76  74 73  Resp: 20 15 16 16   Temp:    97.8 F (36.6 C)  TempSrc:    Axillary  SpO2: 92%  93% 93%  Weight:      Height:       General: obese white male sleeping in bed in NAD; wakes up to questioning but falls back asleep quickly Lungs: CTAB, audible wheezing Heart: NR & RR; systolic murmur heard best at RUSB Abdomen: soft, nontender to palpation, non-distended, normal bowel sounds, no rebound or guarding. PD catheter in place without surrounding erythema Extremities: No LLE edema, R AKA  Labs CBC Latest Ref Rng & Units 02/11/2017 02/10/2017 02/08/2017  WBC 4.0 - 10.5 K/uL 10.8(H) 11.4(H) 11.0(H)  Hemoglobin 13.0 - 17.0 g/dL 8.7(L) 8.3(L) 8.6(L)  Hematocrit 39.0 - 52.0 % 29.6(L) 26.9(L) 27.8(L)  Platelets 150 - 400 K/uL 574(H) 507(H) 463(H)   CMP Latest Ref Rng & Units 02/11/2017 02/10/2017 02/09/2017  Glucose 65 - 99 mg/dL 73 89 128(H)  BUN 6 - 20 mg/dL 45(H) 80(H) 68(H)  Creatinine 0.61 - 1.24 mg/dL 5.80(H) 8.40(H) 7.70(H)  Sodium 135 - 145 mmol/L 137 139 141  Potassium 3.5 - 5.1 mmol/L 4.3 4.8 4.4  Chloride 101 - 111 mmol/L 97(L) 100(L) 100(L)  CO2 22 - 32 mmol/L 27 22 27   Calcium 8.9 - 10.3 mg/dL 8.1(L) 8.3(L) 8.5(L)  Total Protein 6.5 - 8.1 g/dL - - -  Total Bilirubin 0.3 - 1.2 mg/dL - - -  Alkaline Phos 38 - 126 U/L - - -  AST 15 - 41 U/L - - -  ALT 17 - 63 U/L - - -   CXR  8/14 Aortic atherosclerosis. Hypoinflation of the lungs with mild bibasilar subsegmental atelectasis.  Assessment/Plan:  Abdominal Pain, likely secondary to peritonitis from peritoneal catheter Clinically stable and abdominal pain improving. Remains afebrile with normal vital signs. - Continue empiric vancomycin/cefepime - Nephrology following, appreciate their help - OR today for PD cath removal - NPO at midnight - BCx NGTD  Confusion Likely multifactorial - peritonitis with no source control, multiple psychoactive medications, prior episodes of cerebral damage, delirium, possibly uremic. Hopefully, removal of the PD cath will help improve his mental status as well. In addition, a 30-participant study on thiamine deficiency in hemodialysis patients Benson Norway et al., 2001) suggests that there may be some thiamine deficiency in HD patients and protein calorie malnutrition (pt with albumin of 2.6). Will give some thiamine in case this is contributing at all. We will also look at his psych medications to see whether some of these may be playing a role. At his last admission in May, he was previously on Wellbutrin and Paxil, which were discontinued due to QTc prolongation (494/598ms). He is now on depakote 500 BID, lexapro 20mg  daily, xanax 1mg  TID PRN, gabapentin 100 TID PRN, and norco 10-325 q6h  PRN. 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 - Continue to monitor - HD tomorrow - PD catheter removed by General Surgery - Empiric vanc/cefepime - CPAP if tolerated - Oral thiamine daily  Increased oxygen requirement Per wife, patient does not use oxygen at home. Patient intermittently desaturates to the 80s and requires [place] to be put back on. Patient has been very somnolent throughout the day here. He is not using the ordered CPAP, and I believe that some of this may be due to probable OSA. Patient does have a history of HFpEF, but he does not  have LE edema and does not have crackles on my lung exam. We ordered a CXR to make sure there is not a pneumonia that we are missing. CXR with some edema, but he is also a hemodialysis patient, so lasix would not be useful. - Continue to encourage CPAP as able - Continue to monitor - albuterol nebulizers q6h (QTc 404/440ms)  ESRD on HD MWF Appears to have missed HD sessions in the past per nephrology. Weight 198lbs today. - Nephrology following, appreciate their help - HD tomorrow - Continue calcitriol, ferric citrate, cinacalcet  CHF (last echo 10/2016 with EF 60-65% and grade 2 DD) - continue home labetalol, isosorbide dinitrate, and aspirin - Continue HD MWF  HTN sBP 130s - continue home clonidine, labetalol, amlodipine, and hydralazine  Probable OSA CPAP ordered but patient not using  Dispo: Anticipated discharge in approximately 1-2 day(s).   Colbert Ewing, MD  Internal Medicine, PGY-1 02/11/2017, 11:13 AM P [phone]

## 2017-02-11 NOTE — Progress Notes (Signed)
S: Arousable but confused O:BP (!) 155/64   Pulse 74   Temp 98.2 F (36.8 C) (Oral)   Resp 16   Ht 5\' 9"  (1.753 m)   Wt 90.2 kg (198 lb 13.7 oz)   SpO2 93%   BMI 29.37 kg/m   Intake/Output Summary (Last 24 hours) at 02/11/17 0720 Last data filed at 02/11/17 0600  Gross per 24 hour  Intake              310 ml  Output             3500 ml  Net            -3190 ml   Weight change: -2.931 kg (-6 lb 7.4 oz) TFT:DDUKGURKY but confused CVS: RRR 7-0/6 systolic M Resp: few basilar crackles Abd: +BS soft, moans with palpation, exit sit of PD cath no drainage Ext: R AKA  No edema LLE   Lt AVF + bruit NEURO: Delirious   . amLODipine  10 mg Oral Daily  . Chlorhexidine Gluconate Cloth  6 each Topical Daily  . cinacalcet  60 mg Oral Q breakfast  . cloNIDine  0.2 mg Transdermal Q Fri  . darbepoetin (ARANESP) injection - DIALYSIS  100 mcg Intravenous Q Mon-HD  . divalproex  500 mg Oral Q12H  . escitalopram  20 mg Oral Daily  . ferric citrate  420 mg Oral TID WC  . hydrALAZINE  100 mg Oral BID  . isosorbide dinitrate  5 mg Oral BID  . labetalol  300 mg Oral BID  . multivitamin  1 tablet Oral QHS  . mupirocin ointment  1 application Nasal BID  . pravastatin  20 mg Oral Daily  . sodium chloride flush  3 mL Intravenous Q12H  . sodium chloride flush  3 mL Intravenous Q12H   No results found. BMET    Component Value Date/Time   NA 137 02/11/2017 0614   K 4.3 02/11/2017 0614   CL 97 (L) 02/11/2017 0614   CO2 27 02/11/2017 0614   GLUCOSE 73 02/11/2017 0614   BUN 45 (H) 02/11/2017 0614   CREATININE 5.80 (H) 02/11/2017 0614   CALCIUM 8.1 (L) 02/11/2017 0614   GFRNONAA 11 (L) 02/11/2017 0614   GFRAA 12 (L) 02/11/2017 0614   CBC    Component Value Date/Time   WBC 10.8 (H) 02/11/2017 0614   RBC 3.36 (L) 02/11/2017 0614   HGB 8.7 (L) 02/11/2017 0614   HCT 29.6 (L) 02/11/2017 0614   PLT 574 (H) 02/11/2017 0614   MCV 88.1 02/11/2017 0614   MCH 25.9 (L) 02/11/2017 0614   MCHC 29.4  (L) 02/11/2017 0614   RDW 17.1 (H) 02/11/2017 0614   LYMPHSABS 1.9 11/28/2016 0547   MONOABS 1.7 (H) 11/28/2016 0547   EOSABS 0.9 (H) 11/28/2016 0547   BASOSABS 0.2 (H) 11/28/2016 0547     Assessment:  1. Abdominal pain, ? Peritonitis.  Vanco/cefipime  To get PD cath out, hopefully today.  BC neg to date 2. ESRD  MWF [place] 3. HTN 4. Anemia on aranesp 5. DM 6. Sec HPTH sensipar  Plan: 1. To get PD cath out 2. HD tomorrow 3. Resume hectorol   Hayden [name] T

## 2017-02-11 NOTE — Transfer of Care (Signed)
Immediate Anesthesia Transfer of Care Note  Patient: Hayden [name]  Procedure(s) Performed: Procedure(s): CONTINUOUS AMBULATORY PERITONEAL DIALYSIS  (CAPD) CATHETER REMOVAL (N/A)  Patient Location: ICU  Anesthesia Type:General  Level of Consciousness: sedated  Airway & Oxygen Therapy: Patient remains intubated per anesthesia plan and Patient placed on Ventilator (see vital sign flow sheet for setting)  Post-op Assessment: Report given to RN and Post -op Vital signs reviewed and stable  Post vital signs: Reviewed and stable  Last Vitals:  Vitals:   02/11/17 0600 02/11/17 0747  BP: (!) 155/64 127/68  Pulse: 74 73  Resp: 16 16  Temp:  36.6 C  SpO2: 93% 93%    Last Pain:  Vitals:   02/11/17 0747  TempSrc: Axillary  PainSc:       Patients Stated Pain Goal: 2 (91/50/56 9794)  Complications: No apparent anesthesia complications

## 2017-02-11 NOTE — Anesthesia Postprocedure Evaluation (Signed)
Anesthesia Post Note  Patient: Hayden [name]  Procedure(s) Performed: Procedure(s) (LRB): CONTINUOUS AMBULATORY PERITONEAL DIALYSIS  (CAPD) CATHETER REMOVAL (N/A)     Patient location during evaluation: SICU Anesthesia Type: General Level of consciousness: sedated Pain management: pain level controlled Vital Signs Assessment: post-procedure vital signs reviewed and stable Respiratory status: patient remains intubated per anesthesia plan Cardiovascular status: stable Anesthetic complications: no    Last Vitals:  Vitals:   02/11/17 1120 02/11/17 1201  BP:    Pulse:    Resp: 17   Temp:  36.9 C  SpO2: 96%     Last Pain:  Vitals:   02/11/17 1201  TempSrc: Oral  PainSc:                  Monica Zahler

## 2017-02-11 NOTE — Anesthesia Preprocedure Evaluation (Addendum)
Anesthesia Evaluation  Patient identified by MRN, date of birth, ID band Patient confused  General Assessment Comment:Somewhat arousable . Not following commands. Desats without O2. Appears to be hypoventilating  History of Anesthesia Complications (+) PONV  Airway Mallampati: III  TM Distance: <3 FB Neck ROM: Full    Dental no notable dental hx.    Pulmonary shortness of breath, sleep apnea , Current Smoker,     + decreased breath sounds      Cardiovascular hypertension, + Peripheral Vascular Disease and +CHF   Rhythm:Regular Rate:Normal     Neuro/Psych  Neuromuscular disease CVA    GI/Hepatic GERD  ,  Endo/Other  diabetes  Renal/GU ESRFRenal disease     Musculoskeletal   Abdominal   Peds  Hematology   Anesthesia Other Findings   Reproductive/Obstetrics                            Anesthesia Physical Anesthesia Plan  ASA: IV  Anesthesia Plan: General   Post-op Pain Management:    Induction: Intravenous  PONV Risk Score and Plan: 2 and Ondansetron and Dexamethasone  Airway Management Planned: Oral ETT  Additional Equipment:   Intra-op Plan:   Post-operative Plan: Extubation in OR and Possible Post-op intubation/ventilation  Informed Consent: I have reviewed the patients History and Physical, chart, labs and discussed the procedure including the risks, benefits and alternatives for the proposed anesthesia with the patient or authorized representative who has indicated his/her understanding and acceptance.   Dental advisory given  Plan Discussed with: CRNA  Anesthesia Plan Comments: (Wife at bedside Very likely will remain intubated postop)       Anesthesia Quick Evaluation

## 2017-02-11 NOTE — Progress Notes (Signed)
Called report to short stay RN named Herbie Baltimore   I will be transporting pt due to confusion.

## 2017-02-12 ENCOUNTER — Inpatient Hospital Stay (HOSPITAL_COMMUNITY): Payer: Medicare Other

## 2017-02-12 ENCOUNTER — Encounter (HOSPITAL_COMMUNITY): Payer: Self-pay | Admitting: Surgery

## 2017-02-12 DIAGNOSIS — R0602 Shortness of breath: Secondary | ICD-10-CM

## 2017-02-12 DIAGNOSIS — A419 Sepsis, unspecified organism: Secondary | ICD-10-CM

## 2017-02-12 DIAGNOSIS — R0603 Acute respiratory distress: Secondary | ICD-10-CM

## 2017-02-12 DIAGNOSIS — R0902 Hypoxemia: Secondary | ICD-10-CM

## 2017-02-12 DIAGNOSIS — R1084 Generalized abdominal pain: Secondary | ICD-10-CM

## 2017-02-12 LAB — TROPONIN I: TROPONIN I: 0.07 ng/mL — AB (ref <0.03)

## 2017-02-12 LAB — CBC
HEMATOCRIT: 29.7 % — AB (ref 39.0–52.0)
Hemoglobin: 9.2 g/dL — ABNORMAL LOW (ref 13.0–17.0)
MCH: 26.1 pg (ref 26.0–34.0)
MCHC: 31 g/dL (ref 30.0–36.0)
MCV: 84.4 fL (ref 78.0–100.0)
Platelets: 652 10*3/uL — ABNORMAL HIGH (ref 150–400)
RBC: 3.52 MIL/uL — ABNORMAL LOW (ref 4.22–5.81)
RDW: 17.3 % — AB (ref 11.5–15.5)
WBC: 10.1 10*3/uL (ref 4.0–10.5)

## 2017-02-12 LAB — CULTURE, BLOOD (ROUTINE X 2)
CULTURE: NO GROWTH
Culture: NO GROWTH
SPECIAL REQUESTS: ADEQUATE
Special Requests: ADEQUATE

## 2017-02-12 LAB — BASIC METABOLIC PANEL
Anion gap: 13 (ref 5–15)
BUN: 53 mg/dL — ABNORMAL HIGH (ref 6–20)
CO2: 22 mmol/L (ref 22–32)
CREATININE: 6.37 mg/dL — AB (ref 0.61–1.24)
Calcium: 7.7 mg/dL — ABNORMAL LOW (ref 8.9–10.3)
Chloride: 104 mmol/L (ref 101–111)
GFR calc non Af Amer: 10 mL/min — ABNORMAL LOW (ref >60)
GFR, EST AFRICAN AMERICAN: 11 mL/min — AB (ref >60)
GLUCOSE: 441 mg/dL — AB (ref 65–99)
Potassium: 3.2 mmol/L — ABNORMAL LOW (ref 3.5–5.1)
Sodium: 139 mmol/L (ref 135–145)

## 2017-02-12 LAB — GLUCOSE, CAPILLARY
GLUCOSE-CAPILLARY: 91 mg/dL (ref 65–99)
GLUCOSE-CAPILLARY: 90 mg/dL (ref 65–99)
GLUCOSE-CAPILLARY: 82 mg/dL (ref 65–99)
GLUCOSE-CAPILLARY: 86 mg/dL (ref 65–99)
Glucose-Capillary: 84 mg/dL (ref 65–99)
Glucose-Capillary: 88 mg/dL (ref 65–99)

## 2017-02-12 LAB — POCT I-STAT 3, ART BLOOD GAS (G3+)
Acid-Base Excess: 1 mmol/L (ref 0.0–2.0)
Bicarbonate: 25.1 mmol/L (ref 20.0–28.0)
O2 SAT: 98 %
PCO2 ART: 38.8 mmHg (ref 32.0–48.0)
PH ART: 7.418 (ref 7.350–7.450)
Patient temperature: 98.7
TCO2: 26 mmol/L (ref 0–100)
pO2, Arterial: 95 mmHg (ref 83.0–108.0)

## 2017-02-12 LAB — PHOSPHORUS: Phosphorus: 7.4 mg/dL — ABNORMAL HIGH (ref 2.5–4.6)

## 2017-02-12 LAB — MAGNESIUM: Magnesium: 2 mg/dL (ref 1.7–2.4)

## 2017-02-12 MED ORDER — LORAZEPAM 2 MG/ML IJ SOLN
0.5000 mg | Freq: Two times a day (BID) | INTRAMUSCULAR | Status: DC
  Administered 2017-02-12 – 2017-02-14 (×5): 0.5 mg via INTRAVENOUS
  Filled 2017-02-12 (×5): qty 1

## 2017-02-12 MED ORDER — POTASSIUM CHLORIDE 20 MEQ/15ML (10%) PO SOLN
20.0000 meq | Freq: Once | ORAL | Status: AC
  Administered 2017-02-12: 20 meq
  Filled 2017-02-12: qty 15

## 2017-02-12 MED ORDER — FUROSEMIDE 10 MG/ML IJ SOLN
80.0000 mg | Freq: Two times a day (BID) | INTRAMUSCULAR | Status: DC
  Administered 2017-02-12: 80 mg via INTRAVENOUS
  Filled 2017-02-12 (×3): qty 8

## 2017-02-12 MED ORDER — DOXERCALCIFEROL 4 MCG/2ML IV SOLN
INTRAVENOUS | Status: AC
  Filled 2017-02-12: qty 2

## 2017-02-12 MED ORDER — VANCOMYCIN HCL IN DEXTROSE 1-5 GM/200ML-% IV SOLN
INTRAVENOUS | Status: AC
  Filled 2017-02-12: qty 200

## 2017-02-12 MED ORDER — HYDRALAZINE HCL 20 MG/ML IJ SOLN
10.0000 mg | INTRAMUSCULAR | Status: DC | PRN
  Administered 2017-02-13: 20 mg via INTRAVENOUS
  Administered 2017-02-13 (×2): 10 mg via INTRAVENOUS
  Administered 2017-02-14 (×2): 20 mg via INTRAVENOUS
  Filled 2017-02-12 (×5): qty 1

## 2017-02-12 MED ORDER — POTASSIUM CHLORIDE 10 MEQ/100ML IV SOLN: 10.0000 meq | INTRAVENOUS | Status: DC

## 2017-02-12 NOTE — Progress Notes (Signed)
S: Unable to extubate post PD cath removal O:BP (!) 148/60   Pulse 80   Temp 98.2 F (36.8 C) (Oral)   Resp 17   Ht 5' 9" (1.753 m)   Wt 92.1 kg (203 lb 0.7 oz)   SpO2 99%   BMI 29.98 kg/m   Intake/Output Summary (Last 24 hours) at 02/12/17 2355 Last data filed at 02/12/17 0600  Gross per 24 hour  Intake          2378.53 ml  Output                5 ml  Net          2373.53 ml   Weight change: 0.5 kg (1 lb 1.6 oz) Gen: sedated CVS: RRR 3/6 systolic M Resp: few basilar crackles Abd: +BS soft, ND PD cath out Ext: R AKA  No edema LLE   Lt AVF + bruit NEURO: sedated   . aspirin  81 mg Per Tube Daily  . calcitRIOL  0.5 mcg Oral Daily  . chlorhexidine gluconate (MEDLINE KIT)  15 mL Mouth Rinse BID  . Chlorhexidine Gluconate Cloth  6 each Topical Daily  . cinacalcet  60 mg Oral Q breakfast  . cloNIDine  0.2 mg Transdermal Q Fri  . darbepoetin (ARANESP) injection - DIALYSIS  100 mcg Intravenous Q Mon-HD  . doxercalciferol  1 mcg Intravenous Q M,W,F-HD  . ferric citrate  420 mg Oral TID WC  . ipratropium-albuterol  3 mL Nebulization Q6H  . mouth rinse  15 mL Mouth Rinse QID  . multivitamin  1 tablet Oral QHS  . mupirocin ointment  1 application Nasal BID  . nicotine  14 mg Transdermal Daily  . pantoprazole (PROTONIX) IV  40 mg Intravenous Daily  . pravastatin  20 mg Per Tube Daily  . sodium chloride flush  3 mL Intravenous Q12H  . sodium chloride flush  3 mL Intravenous Q12H  . thiamine  100 mg Per Tube Daily   Dg Chest Port 1 View  Result Date: 02/11/2017 CLINICAL DATA:  Respiratory difficulty during surgery. Intubated patient. History of CHF, diabetes, dialysis dependent renal failure, renal malignancy. EXAM: PORTABLE CHEST 1 VIEW COMPARISON:  Portable chest x-ray of February 11, 2017 at 8:43 a.m. FINDINGS: The lungs remain hypoinflated. Elevation of the right hemidiaphragm persists. The interstitial markings are mildly prominent. Suspected right basilar atelectasis is less  conspicuous now. The cardiac silhouette remains enlarged and the pulmonary vascularity engorged. There is calcification in the wall of the thoracic aorta. The endotracheal tube tip lies 6.6 cm above the carina. The esophagogastric tube tip projects below the inferior margin of the image. IMPRESSION: Persistent bilateral hypoinflation. Mild CHF. Some improvement in right basilar atelectasis. The support tubes are in reasonable position. Thoracic aortic atherosclerosis peer Electronically Signed   By: David  Martinique M.D.   On: 02/11/2017 12:04   Dg Chest Port 1 View  Result Date: 02/11/2017 CLINICAL DATA:  Hypoxia. EXAM: PORTABLE CHEST 1 VIEW COMPARISON:  Radiograph of February 08, 2017. FINDINGS: Stable cardiomegaly. Atherosclerosis of thoracic aorta is noted. Hypoinflation of the lungs is noted with mild bibasilar subsegmental atelectasis. No pneumothorax is noted. Bony thorax is unremarkable. IMPRESSION: Aortic atherosclerosis. Hypoinflation of the lungs with mild bibasilar subsegmental atelectasis. Electronically Signed   By: Marijo Conception, M.D.   On: 02/11/2017 08:53   BMET    Component Value Date/Time   NA 139 02/12/2017 0249   K 3.2 (L) 02/12/2017 0249  CL 104 02/12/2017 0249   CO2 22 02/12/2017 0249   GLUCOSE 441 (H) 02/12/2017 0249   BUN 53 (H) 02/12/2017 0249   CREATININE 6.37 (H) 02/12/2017 0249   CALCIUM 7.7 (L) 02/12/2017 0249   GFRNONAA 10 (L) 02/12/2017 0249   GFRAA 11 (L) 02/12/2017 0249   CBC    Component Value Date/Time   WBC 10.1 02/12/2017 0249   RBC 3.52 (L) 02/12/2017 0249   HGB 9.2 (L) 02/12/2017 0249   HCT 29.7 (L) 02/12/2017 0249   PLT 652 (H) 02/12/2017 0249   MCV 84.4 02/12/2017 0249   MCH 26.1 02/12/2017 0249   MCHC 31.0 02/12/2017 0249   RDW 17.3 (H) 02/12/2017 0249   LYMPHSABS 1.9 11/28/2016 0547   MONOABS 1.7 (H) 11/28/2016 0547   EOSABS 0.9 (H) 11/28/2016 0547   BASOSABS 0.2 (H) 11/28/2016 0547     Assessment:  1. Abdominal pain, ? Peritonitis.   Vanco/cefipime  PD cath out  BC neg to date 2. ESRD  MWF [facility] 3. HTN 4. Anemia on aranesp 5. DM 6. Sec HPTH sensipar/hectorol  Plan: 1. He does not need calcitriol and hectorol so will Dc calcitriol 2. HD today on 4K bath which will correct his sl low K 3. Hopefully can extubate today   , T 

## 2017-02-12 NOTE — Procedures (Signed)
Extubation Procedure Note  Patient Details:   Name: Hayden [name] DOB: 1970/09/07 MRN: [id number]   Airway Documentation:     Evaluation  O2 sats: stable throughout Complications: No apparent complications Patient did tolerate procedure well. Bilateral Breath Sounds: Clear, Diminished   No  Pt moaning but not following commands & not speaking.  Positive cuff leak noted, pt placed on [place] 4 L with humidity, tolerating well.  No stridor noted.  Bayard Beaver 02/12/2017, 12:33 PM

## 2017-02-12 NOTE — Progress Notes (Signed)
Pts wife asking about missing eye glasses, last known location was pts previous unit ([facility]) but they were not brought with patient to [facility]. Called [facility] and glasses were not there.

## 2017-02-12 NOTE — Progress Notes (Signed)
OT Cancellation Note  Patient Details Name: Hayden [name] MRN: [id number] DOB: 02-22-1971   Cancelled Treatment:    Reason Eval/Treat Not Completed: Patient not medically ready (Remains intubated.) Will follow.  Malka So 02/12/2017, 8:25 AM  [phone]

## 2017-02-12 NOTE — Progress Notes (Signed)
Pharmacy Antibiotic Note  Hayden [name] is a 46 y.o. male admitted on 02/07/2017 with concern for peritonitis.  Pharmacy has been consulted for vancomycin/cefepime dosing. Blood cultures are negative, MRSA PCR negative, but a surgical pcr screen on 08/13 was positive for staph aureus. WBC's have been stable ~10 and he is afebrile.  On 08/14 he went to the OR and had his PD cath removed, but became hypoxic and was transferred to the ICU. He has dialysis MWF and is currently on the same schedule. His current empiric antibiotic regimen shall be continued.  Plan: Cefepime 1 gram IV Q 24 hours Vancomycin 1 gram IV Q MWF after HD Follow up LOT, cultures, clinical status Pre-HD vancomycin level if needed  Height: 5\' 9"  (175.3 cm) Weight: 203 lb 0.7 oz (92.1 kg) IBW/kg (Calculated) : 70.7  Temp (24hrs), Avg:98.3 F (36.8 C), Min:98.1 F (36.7 C), Max:98.6 F (37 C)   Recent Labs Lab 02/07/17 1223 02/07/17 1438 02/07/17 2353 02/08/17 0822 02/08/17 0823 02/08/17 0824 02/09/17 2104 02/10/17 0825 02/11/17 0614 02/12/17 0249  WBC  --   --  8.8 11.0*  --   --   --  11.4* 10.8* 10.1  CREATININE  --   --  9.03*  --  9.32*  --  7.70* 8.40* 5.80* 6.37*  LATICACIDVEN 1.41 3.14*  --   --   --  0.7  --   --   --   --     Estimated Creatinine Clearance: 16.6 mL/min (A) (by C-G formula based on SCr of 6.37 mg/dL (H)).    Antibiotics on this admission: Vancomycin 8/10> Cefepime 8/10>  Cultures: 8/13 Surgical PCR screen>>positive for staph aureus 8/10 BC x 2 >> Negative 8/10 MRSA PCR >>Negative    Patterson Hammersmith PharmD PGY1 Pharmacy Practice Resident 02/12/2017 3:27 PM Pager: [phone]

## 2017-02-12 NOTE — Progress Notes (Signed)
[facility] ADULT ICU REPLACEMENT PROTOCOL FOR AM LAB REPLACEMENT ONLY  The patient does not apply for the [facility] Adult ICU Electrolyte Replacment Protocol based on the criteria listed below:   1. Is GFR >/= 40 ml/min? No.  Patient's GFR today is 11   Abnormal electrolyte(s):K3.2  If a panic level lab has been reported, has the CCM MD in charge been notified? Yes.  .   Physician:  Ermalene Postin, MD  Taggert, Bozzi 02/12/2017 5:19 AM

## 2017-02-12 NOTE — Progress Notes (Signed)
PT Cancellation Note  Patient Details Name: Hayden [name] MRN: [id number] DOB: 1971-04-01   Cancelled Treatment:    Reason Eval/Treat Not Completed: Medical issues which prohibited therapy. Pt remains intubated post op.   Gray 02/12/2017, 8:24 AM Suanne Marker PT [phone]

## 2017-02-12 NOTE — Consult Note (Addendum)
PULMONARY / CRITICAL CARE MEDICINE   Name: Hayden [name] MRN: [id number] DOB: 02-07-1971    ADMISSION DATE:  02/07/2017 CONSULTATION DATE:  02/11/17  REFERRING MD:  Eppie Gibson  CHIEF COMPLAINT:  VDRF  HISTORY OF PRESENT ILLNESS:  Pt is encephelopathic; therefore, this HPI is obtained from chart review. Hayden [name] is a 46 y.o. 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  Of note, he was switched from PD to HD since admission in June 2018.  He had had multiple appointments at [facility] to flush and remove PD cath; however, has missed all of these.  He presented to [facility] 8/10 with diffuse abdominal pain.  It was felt that he had peritonitis from PD cath.  He was subsequently taken to OR 8/14 for removal of PD cath.  Following procedure, he failed extubation; therefore, was transferred to ICU.  SUBJECTIVE:  On min vent settings  VITAL SIGNS: BP (!) 135/95   Pulse 88   Temp 98.2 F (36.8 C) (Oral)   Resp 18   Ht 5\' 9"  (1.753 m)   Wt 92.1 kg (203 lb 0.7 oz)   SpO2 98%   BMI 29.98 kg/m   HEMODYNAMICS:    VENTILATOR SETTINGS: Vent Mode: CPAP;PSV FiO2 (%):  [40 %-100 %] 40 % Set Rate:  [16 bmp] 16 bmp Vt Set:  [530 mL] 530 mL PEEP:  [5 cmH20] 5 cmH20 Pressure Support:  [10 cmH20] 10 cmH20 Plateau Pressure:  [16 cmH20-19 cmH20] 19 cmH20  INTAKE / OUTPUT: I/O last 3 completed shifts: In: 2940.6 [P.O.:120; I.V.:2665.6; IV Piggyback:155] Out: 5 [Blood:5]   PHYSICAL EXAMINATION: General: on vent Neuro: rass 2, moving all ext HEENT: ett, jvd wnl PULM: CTA  CV: s1 s2 RRR no r GI: soft, incisions dry, low BS Extremities: no edema   LABS:  BMET  Recent Labs Lab 02/10/17 0825 02/11/17 0614 02/12/17 0249  NA 139 137 139  K 4.8 4.3 3.2*  CL 100* 97* 104  CO2 22 27 22   BUN 80* 45* 53*  CREATININE 8.40* 5.80* 6.37*  GLUCOSE 89 73 441*     Electrolytes  Recent Labs Lab 02/10/17 0825 02/11/17 0614 02/12/17 0249  CALCIUM 8.3* 8.1* 7.7*  MG  --   --  2.0  PHOS 11.4* 8.9* 7.4*    CBC  Recent Labs Lab 02/10/17 0825 02/11/17 0614 02/12/17 0249  WBC 11.4* 10.8* 10.1  HGB 8.3* 8.7* 9.2*  HCT 26.9* 29.6* 29.7*  PLT 507* 574* 652*    Coag's No results for input(s): APTT, INR in the last 168 hours.  Sepsis Markers  Recent Labs Lab 02/07/17 1223 02/07/17 1438 02/08/17 0824  LATICACIDVEN 1.41 3.14* 0.7    ABG  Recent Labs Lab 02/11/17 1310 02/12/17 0421  PHART 7.415 7.418  PCO2ART 43.9 38.8  PO2ART 302.0* 95.0    Liver Enzymes  Recent Labs Lab 02/07/17 1208 02/08/17 0823 02/10/17 0825 02/11/17 0614  AST 11*  --   --   --   ALT 11*  --   --   --   ALKPHOS 219*  --   --   --   BILITOT 0.9  --   --   --   ALBUMIN 2.8* 2.4* 2.4* 2.4*    Cardiac Enzymes No results for input(s): TROPONINI, PROBNP in the last 168 hours.  Glucose  Recent Labs Lab 02/11/17 1742 02/11/17 1841 02/11/17 1950  02/11/17 2350 02/12/17 0357 02/12/17 0756  GLUCAP 58* 103* 81 80 84 91    Imaging Portable Chest Xray  Result Date: 02/12/2017 CLINICAL DATA:  Hypoxia. EXAM: PORTABLE CHEST 1 VIEW COMPARISON:  02/11/2017. FINDINGS: Endotracheal tube, NG tube in stable position. Cardiomegaly with bilateral pulmonary infiltrates/edema. Small bilateral pleural effusions cannot be excluded. No pneumothorax. IMPRESSION: 1. Lines and tubes in stable position. 2. Cardiomegaly with bilateral pulmonary infiltrates/edema. Small bilateral pleural effusions cannot be excluded . Electronically Signed   By: Marcello Moores  Register   On: 02/12/2017 06:51   Dg Chest Port 1 View  Result Date: 02/11/2017 CLINICAL DATA:  Respiratory difficulty during surgery. Intubated patient. History of CHF, diabetes, dialysis dependent renal failure, renal malignancy. EXAM: PORTABLE CHEST 1 VIEW COMPARISON:  Portable chest x-ray of February 11, 2017 at  8:43 a.m. FINDINGS: The lungs remain hypoinflated. Elevation of the right hemidiaphragm persists. The interstitial markings are mildly prominent. Suspected right basilar atelectasis is less conspicuous now. The cardiac silhouette remains enlarged and the pulmonary vascularity engorged. There is calcification in the wall of the thoracic aorta. The endotracheal tube tip lies 6.6 cm above the carina. The esophagogastric tube tip projects below the inferior margin of the image. IMPRESSION: Persistent bilateral hypoinflation. Mild CHF. Some improvement in right basilar atelectasis. The support tubes are in reasonable position. Thoracic aortic atherosclerosis peer Electronically Signed   By: David  Martinique M.D.   On: 02/11/2017 12:04     STUDIES:  CXR 8/14 > atelectasis.  CULTURES: Blood 8/10 >  Peritoneal fluid 8/14 >   ANTIBIOTICS: Vanc 8/10 >  Cefepime 8/10 >  SIGNIFICANT EVENTS: 8/10 > admit. 8/14 > to OR for removal of PD cath. 8/14- hypoxia post op  LINES/TUBES: ETT 8/14 >   DISCUSSION: 46 y.o. male with ESRD (previously on PD but switched to HD in June), admitted 8/10 with abdominal pain.  Felt to have peritonitis due to infected PD cath.  Taken to OR 8/14 for removal of cath and failed extubation post op.  Returned to ICU on vent.  ASSESSMENT / PLAN:  PULMONARY A: Respiratory insufficiency - failed extubation post op. Atelectasis. pulm edema? P:   Consider neg balance as able with HD asap If he is a prior responder to lasix would favor Repeat pcxr WUA cpap 5 ps 5, goal 30 min  assess rsbi Last abg reviewed, keep same MV  CARDIOVASCULAR A:  Hx HTN, HLD, combined heart failure (Echo from May 2018 with EF 60-65%, G2DD). With edema, r/o ischemia P:  Monitor hemodynamics. Continue clonidine D/c amlodipine, hydralazine, labetalol Continue preadmission ASA. Trop, ecg lasix  RENAL A:   ESRD. P:   Lasix, see pulm HD per renal. BMP in AM.  GASTROINTESTINAL A:    GI prophylaxis. Nutrition. P:   SUP: Pantoprazole. NPO.  HEMATOLOGIC / ONCOLOGIC A:   Hx anemia of CKD, RCC s/p partial right nephrectomy. VTE Prophylaxis. P:  Transfuse for Hgb < 7. SCD's / heparin. CBC in AM.  INFECTIOUS A:   Peritonitis - due to infected PD cath. P:   Abx as above (vanc / cefepime).  Follow or cultures  ENDOCRINE A:   Hx DM. P:   SSI.  NEUROLOGIC A:   Acute encephalopathy - due to sedation. Hx depression, CVA. RAGE issues per wife P:   Sedation:  Propofol gtt / Fentanyl PRN. RASS goal: 0 to -1. Daily WUA. Continue depakote, change to IV, may need bolus or increase Continue thiamine. D/c alprazolam, gabapentin, escitalopram Re add  schedule lorazepam to avoid WD  Family updated: I updated son  Interdisciplinary Family Meeting v Palliative Care Meeting:  Due by: 02/17/17.  CC time: 35 min.  Lavon Paganini. Titus Mould, MD, FACP Pgr: [place] [facility]  Post extubation asked to asses for neurostatus He was confused was lethargic but int speaking nonfocal exam, PErrl, wife in room stated clearly That he has frequent psychotic encpeh like this in hospital  NO ct at this time Continued to limit sedation but use his home regimen as able orally May need imaging if exam changes  Lavon Paganini. Titus Mould, MD, [place] Pgr: [place] [facility]

## 2017-02-12 NOTE — Progress Notes (Signed)
CRITICAL VALUE ALERT  Critical Value:  Trop 0.07  Date & Time Notied:  02/12/17 12:03  Provider Notified: Titus Mould, MD   Orders Received/Actions taken: Reported to MD

## 2017-02-12 NOTE — Progress Notes (Signed)
[facility] Progress Note  1 Day Post-Op  Subjective: CC:  On vent. Weaning FiO2 per CCM. Unresponsive.  Objective: Vital signs in last 24 hours: Temp:  [97.8 F (36.6 C)-98.6 F (37 C)] 98.2 F (36.8 C) (08/15 0359) Pulse Rate:  [62-82] 80 (08/15 0630) Resp:  [16-25] 17 (08/15 0630) BP: (111-185)/(48-98) 148/60 (08/15 0630) SpO2:  [93 %-100 %] 99 % (08/15 0630) FiO2 (%):  [40 %-100 %] 40 % (08/15 0346) Weight:  [91.6 kg (201 lb 15.1 oz)-92.1 kg (203 lb 0.7 oz)] 92.1 kg (203 lb 0.7 oz) (08/15 0354) Last BM Date: 02/09/17  Intake/Output from previous day: 08/14 0701 - 08/15 0700 In: 2378.5 [I.V.:2273.5; IV Piggyback:105] Out: 5 [Blood:5] Intake/Output this shift: No intake/output data recorded.  PE: Gen:  Alert, NAD, pleasant Card:  Regular rate and rhythm, murmur present  Pulm:  Ventilated respirations, clear to auscultation bilaterally Abd: Soft, non-tender, hypoactive BS, incisions are c/d/i with no surrounding erythema or active drainage. Skin: warm and dry, no rashes  Psych: A&Ox3   Lab Results:   Recent Labs  02/11/17 0614 02/12/17 0249  WBC 10.8* 10.1  HGB 8.7* 9.2*  HCT 29.6* 29.7*  PLT 574* 652*   BMET  Recent Labs  02/11/17 0614 02/12/17 0249  NA 137 139  K 4.3 3.2*  CL 97* 104  CO2 27 22  GLUCOSE 73 441*  BUN 45* 53*  CREATININE 5.80* 6.37*  CALCIUM 8.1* 7.7*   PT/INR No results for input(s): LABPROT, INR in the last 72 hours. CMP     Component Value Date/Time   NA 139 02/12/2017 0249   K 3.2 (L) 02/12/2017 0249   CL 104 02/12/2017 0249   CO2 22 02/12/2017 0249   GLUCOSE 441 (H) 02/12/2017 0249   BUN 53 (H) 02/12/2017 0249   CREATININE 6.37 (H) 02/12/2017 0249   CALCIUM 7.7 (L) 02/12/2017 0249   PROT 6.4 (L) 02/07/2017 1208   ALBUMIN 2.4 (L) 02/11/2017 0614   AST 11 (L) 02/07/2017 1208   ALT 11 (L) 02/07/2017 1208   ALKPHOS 219 (H) 02/07/2017 1208   BILITOT 0.9 02/07/2017 1208   GFRNONAA 10 (L) 02/12/2017 0249   GFRAA 11 (L) 02/12/2017 0249   Lipase     Component Value Date/Time   LIPASE 25 02/07/2017 1208       Studies/Results: Portable Chest Xray  Result Date: 02/12/2017 CLINICAL DATA:  Hypoxia. EXAM: PORTABLE CHEST 1 VIEW COMPARISON:  02/11/2017. FINDINGS: Endotracheal tube, NG tube in stable position. Cardiomegaly with bilateral pulmonary infiltrates/edema. Small bilateral pleural effusions cannot be excluded. No pneumothorax. IMPRESSION: 1. Lines and tubes in stable position. 2. Cardiomegaly with bilateral pulmonary infiltrates/edema. Small bilateral pleural effusions cannot be excluded . Electronically Signed   By: Marcello Moores  Register   On: 02/12/2017 06:51   Dg Chest Port 1 View  Result Date: 02/11/2017 CLINICAL DATA:  Respiratory difficulty during surgery. Intubated patient. History of CHF, diabetes, dialysis dependent renal failure, renal malignancy. EXAM: PORTABLE CHEST 1 VIEW COMPARISON:  Portable chest x-ray of February 11, 2017 at 8:43 a.m. FINDINGS: The lungs remain hypoinflated. Elevation of the right hemidiaphragm persists. The interstitial markings are mildly prominent. Suspected right basilar atelectasis is less conspicuous now. The cardiac silhouette remains enlarged and the pulmonary vascularity engorged. There is calcification in the wall of the thoracic aorta. The endotracheal tube tip lies 6.6 cm above the carina. The esophagogastric tube tip projects below the inferior margin of the image. IMPRESSION: Persistent bilateral hypoinflation. Mild CHF. Some  improvement in right basilar atelectasis. The support tubes are in reasonable position. Thoracic aortic atherosclerosis peer Electronically Signed   By: David  Martinique M.D.   On: 02/11/2017 12:04   Dg Chest Port 1 View  Result Date: 02/11/2017 CLINICAL DATA:  Hypoxia. EXAM: PORTABLE CHEST 1 VIEW COMPARISON:  Radiograph of February 08, 2017. FINDINGS: Stable cardiomegaly. Atherosclerosis of thoracic aorta is noted. Hypoinflation of the  lungs is noted with mild bibasilar subsegmental atelectasis. No pneumothorax is noted. Bony thorax is unremarkable. IMPRESSION: Aortic atherosclerosis. Hypoinflation of the lungs with mild bibasilar subsegmental atelectasis. Electronically Signed   By: Marijo Conception, M.D.   On: 02/11/2017 08:53    Anti-infectives: Anti-infectives    Start     Dose/Rate Route Frequency Ordered Stop   02/10/17 1200  vancomycin (VANCOCIN) IVPB 1000 mg/200 mL premix     1,000 mg 200 mL/hr over 60 Minutes Intravenous Every [name]-F (Hemodialysis) 02/09/17 1128     02/08/17 1800  ceFEPIme (MAXIPIME) 1 g in dextrose 5 % 50 mL IVPB     1 g 100 mL/hr over 30 Minutes Intravenous Every 24 hours 02/08/17 1041     02/08/17 1059  vancomycin (VANCOCIN) 1-5 GM/200ML-% IVPB    Comments:  Ashley Akin   : cabinet override      02/08/17 1059 02/08/17 1112   02/08/17 1045  vancomycin (VANCOCIN) IVPB 1000 mg/200 mL premix     1,000 mg 200 mL/hr over 60 Minutes Intravenous To Hemodialysis 02/08/17 1041 02/08/17 1246   02/07/17 1715  ceFEPIme (MAXIPIME) 1 g in dextrose 5 % 50 mL IVPB     1 g 100 mL/hr over 30 Minutes Intravenous  Once 02/07/17 1700 02/07/17 1818   02/07/17 1715  vancomycin (VANCOCIN) IVPB 750 mg/150 ml premix     750 mg 150 mL/hr over 60 Minutes Intravenous  Once 02/07/17 1704 02/07/17 1845   02/07/17 1515  vancomycin (VANCOCIN) IVPB 1000 mg/200 mL premix  Status:  Discontinued     1,000 mg 200 mL/hr over 60 Minutes Intravenous  Once 02/07/17 1503 02/07/17 1740   02/07/17 1515  piperacillin-tazobactam (ZOSYN) IVPB 3.375 g  Status:  Discontinued     3.375 g 100 mL/hr over 30 Minutes Intravenous  Once 02/07/17 1503 02/07/17 1644      Assessment/Plan Peritonitis 2/2 peritoneal dialysis catheter POD#1 S/P REMOVAL PD CATHETER - afebrile, VSS, WBC 10.1, hgb/hct stable - incision is clean and dry without surrounding erythema or active drainage. - bandage may be removed by nurse tomorrow 8/16. Keep wounds  clean a dry. Patient may bathe but incisions should not be soaked under water for prolonged period of time - sponge bath or shower ok.  - general surgery will sign off, call as needed with questions concerns.     LOS: 5 days    [facility] 02/12/2017, 7:30 AM Pager: [phone] Consults: [phone] Mon-Fri 7:00 am-4:30 pm Sat-Sun 7:00 am-11:30 am

## 2017-02-13 ENCOUNTER — Encounter (HOSPITAL_COMMUNITY): Payer: Self-pay | Admitting: *Deleted

## 2017-02-13 ENCOUNTER — Inpatient Hospital Stay (HOSPITAL_COMMUNITY): Payer: Medicare Other

## 2017-02-13 LAB — BASIC METABOLIC PANEL
ANION GAP: 16 — AB (ref 5–15)
BUN: 61 mg/dL — AB (ref 6–20)
CHLORIDE: 101 mmol/L (ref 101–111)
CO2: 21 mmol/L — ABNORMAL LOW (ref 22–32)
Calcium: 8.5 mg/dL — ABNORMAL LOW (ref 8.9–10.3)
Creatinine, Ser: 7.69 mg/dL — ABNORMAL HIGH (ref 0.61–1.24)
GFR calc Af Amer: 9 mL/min — ABNORMAL LOW (ref >60)
GFR calc non Af Amer: 8 mL/min — ABNORMAL LOW (ref >60)
Glucose, Bld: 93 mg/dL (ref 65–99)
POTASSIUM: 4.5 mmol/L (ref 3.5–5.1)
SODIUM: 138 mmol/L (ref 135–145)

## 2017-02-13 LAB — CBC WITH DIFFERENTIAL/PLATELET
BASOS ABS: 0.1 10*3/uL (ref 0.0–0.1)
BASOS PCT: 1 %
Eosinophils Absolute: 0.8 10*3/uL — ABNORMAL HIGH (ref 0.0–0.7)
Eosinophils Relative: 7 %
HEMATOCRIT: 28.5 % — AB (ref 39.0–52.0)
Hemoglobin: 8.7 g/dL — ABNORMAL LOW (ref 13.0–17.0)
LYMPHS PCT: 7 %
Lymphs Abs: 0.7 10*3/uL (ref 0.7–4.0)
MCH: 26.3 pg (ref 26.0–34.0)
MCHC: 30.5 g/dL (ref 30.0–36.0)
MCV: 86.1 fL (ref 78.0–100.0)
MONO ABS: 1.5 10*3/uL — AB (ref 0.1–1.0)
Monocytes Relative: 13 %
NEUTROS ABS: 8.3 10*3/uL — AB (ref 1.7–7.7)
Neutrophils Relative %: 72 %
Platelets: 664 10*3/uL — ABNORMAL HIGH (ref 150–400)
RBC: 3.31 MIL/uL — AB (ref 4.22–5.81)
RDW: 17.1 % — ABNORMAL HIGH (ref 11.5–15.5)
WBC: 11.4 10*3/uL — AB (ref 4.0–10.5)

## 2017-02-13 LAB — GLUCOSE, CAPILLARY
GLUCOSE-CAPILLARY: 86 mg/dL (ref 65–99)
GLUCOSE-CAPILLARY: 74 mg/dL (ref 65–99)
Glucose-Capillary: 79 mg/dL (ref 65–99)
Glucose-Capillary: 96 mg/dL (ref 65–99)

## 2017-02-13 LAB — VANCOMYCIN, TROUGH: VANCOMYCIN TR: 16 ug/mL (ref 15–20)

## 2017-02-13 MED ORDER — CHLORHEXIDINE GLUCONATE CLOTH 2 % EX PADS
6.0000 | MEDICATED_PAD | Freq: Every day | CUTANEOUS | Status: DC
  Administered 2017-02-13 – 2017-02-16 (×4): 6 via TOPICAL

## 2017-02-13 MED ORDER — VANCOMYCIN HCL IN DEXTROSE 1-5 GM/200ML-% IV SOLN
INTRAVENOUS | Status: AC
  Administered 2017-02-13: 1000 mg via INTRAVENOUS
  Filled 2017-02-13: qty 200

## 2017-02-13 MED ORDER — HALOPERIDOL LACTATE 5 MG/ML IJ SOLN: 2.5000 mg | Freq: Three times a day (TID) | INTRAMUSCULAR | Status: DC

## 2017-02-13 MED ORDER — PANTOPRAZOLE SODIUM 40 MG IV SOLR
40.0000 mg | Freq: Every day | INTRAVENOUS | Status: DC
  Administered 2017-02-13 – 2017-02-14 (×2): 40 mg via INTRAVENOUS
  Filled 2017-02-13 (×2): qty 40

## 2017-02-13 MED ORDER — PANTOPRAZOLE SODIUM 40 MG PO PACK
40.0000 mg | PACK | Freq: Every day | ORAL | Status: DC
  Filled 2017-02-13: qty 20

## 2017-02-13 MED ORDER — METOPROLOL TARTRATE 5 MG/5ML IV SOLN
5.0000 mg | Freq: Once | INTRAVENOUS | Status: AC
  Administered 2017-02-13: 5 mg via INTRAVENOUS
  Filled 2017-02-13: qty 5

## 2017-02-13 MED ORDER — LORAZEPAM 2 MG/ML IJ SOLN
1.0000 mg | Freq: Once | INTRAMUSCULAR | Status: AC | PRN
  Administered 2017-02-14: 1 mg via INTRAVENOUS
  Filled 2017-02-13: qty 1

## 2017-02-13 MED ORDER — HYDRALAZINE HCL 20 MG/ML IJ SOLN
INTRAMUSCULAR | Status: AC
  Administered 2017-02-13: 10 mg via INTRAVENOUS
  Filled 2017-02-13: qty 1

## 2017-02-13 MED ORDER — VALPROATE SODIUM 500 MG/5ML IV SOLN
750.0000 mg | Freq: Two times a day (BID) | INTRAVENOUS | Status: DC
  Administered 2017-02-13 (×2): 750 mg via INTRAVENOUS
  Filled 2017-02-13 (×3): qty 7.5

## 2017-02-13 MED ORDER — DEXTROSE-NACL 5-0.45 % IV SOLN: INTRAVENOUS | Status: DC

## 2017-02-13 MED ORDER — HALOPERIDOL LACTATE 5 MG/ML IJ SOLN
4.0000 mg | Freq: Three times a day (TID) | INTRAMUSCULAR | Status: DC
  Administered 2017-02-13 – 2017-02-14 (×3): 4 mg via INTRAVENOUS
  Filled 2017-02-13: qty 1
  Filled 2017-02-13 (×3): qty 0.8
  Filled 2017-02-13: qty 1
  Filled 2017-02-13 (×2): qty 0.8

## 2017-02-13 MED ORDER — VANCOMYCIN HCL IN DEXTROSE 1-5 GM/200ML-% IV SOLN
1000.0000 mg | INTRAVENOUS | Status: AC
  Administered 2017-02-13 – 2017-02-16 (×2): 1000 mg via INTRAVENOUS
  Filled 2017-02-13 (×3): qty 200

## 2017-02-13 NOTE — Progress Notes (Signed)
S: Does not answer ? O:BP (!) 145/42   Pulse 88   Temp 97.6 F (36.4 C) (Axillary)   Resp (!) 23   Ht 5' 9"  (1.753 m)   Wt 92.6 kg (204 lb 2.3 oz)   SpO2 95%   BMI 30.15 kg/m   Intake/Output Summary (Last 24 hours) at 02/13/17 0708 Last data filed at 02/13/17 0500  Gross per 24 hour  Intake          1550.67 ml  Output                0 ml  Net          1550.67 ml   Weight change: 2.4 kg (5 lb 4.7 oz) Gen: arousable but non communicative CVS: RRR 3/6 systolic M Resp: few basilar crackles Abd: +BS soft, ND appears non tender as he does not respond to deep palpation  Ext: R AKA  No edema LLE   Lt AVF + bruit NEURO: Moves all ext, opens eyes but does not answer ?   . aspirin  81 mg Per Tube Daily  . chlorhexidine gluconate (MEDLINE KIT)  15 mL Mouth Rinse BID  . Chlorhexidine Gluconate Cloth  6 each Topical Daily  . cinacalcet  60 mg Oral Q breakfast  . cloNIDine  0.2 mg Transdermal Q Fri  . darbepoetin (ARANESP) injection - DIALYSIS  100 mcg Intravenous Q Mon-HD  . doxercalciferol  1 mcg Intravenous Q M,W,F-HD  . ferric citrate  420 mg Oral TID WC  . ipratropium-albuterol  3 mL Nebulization Q6H  . LORazepam  0.5 mg Intravenous BID  . mouth rinse  15 mL Mouth Rinse QID  . multivitamin  1 tablet Oral QHS  . mupirocin ointment  1 application Nasal BID  . nicotine  14 mg Transdermal Daily  . pantoprazole (PROTONIX) IV  40 mg Intravenous Daily  . pravastatin  20 mg Per Tube Daily  . sodium chloride flush  3 mL Intravenous Q12H  . sodium chloride flush  3 mL Intravenous Q12H  . thiamine  100 mg Per Tube Daily   Dg Chest Port 1 View  Result Date: 02/13/2017 CLINICAL DATA:  Respiratory acidosis EXAM: PORTABLE CHEST 1 VIEW COMPARISON:  02/12/2017 FINDINGS: Should ache shadow is enlarged but stable. Aortic calcifications are again seen. Diffuse vascular congestion and pulmonary edema is noted slightly increased from the prior exam particularly on the left. Endotracheal tube and  nasogastric catheter been removed in the interval. IMPRESSION: Increasing pulmonary edema. Electronically Signed   By: Inez Catalina M.D.   On: 02/13/2017 06:52   Portable Chest Xray  Result Date: 02/12/2017 CLINICAL DATA:  Hypoxia. EXAM: PORTABLE CHEST 1 VIEW COMPARISON:  02/11/2017. FINDINGS: Endotracheal tube, NG tube in stable position. Cardiomegaly with bilateral pulmonary infiltrates/edema. Small bilateral pleural effusions cannot be excluded. No pneumothorax. IMPRESSION: 1. Lines and tubes in stable position. 2. Cardiomegaly with bilateral pulmonary infiltrates/edema. Small bilateral pleural effusions cannot be excluded . Electronically Signed   By: Marcello Moores  Register   On: 02/12/2017 06:51   Dg Chest Port 1 View  Result Date: 02/11/2017 CLINICAL DATA:  Respiratory difficulty during surgery. Intubated patient. History of CHF, diabetes, dialysis dependent renal failure, renal malignancy. EXAM: PORTABLE CHEST 1 VIEW COMPARISON:  Portable chest x-ray of February 11, 2017 at 8:43 a.m. FINDINGS: The lungs remain hypoinflated. Elevation of the right hemidiaphragm persists. The interstitial markings are mildly prominent. Suspected right basilar atelectasis is less conspicuous now. The cardiac silhouette remains enlarged  and the pulmonary vascularity engorged. There is calcification in the wall of the thoracic aorta. The endotracheal tube tip lies 6.6 cm above the carina. The esophagogastric tube tip projects below the inferior margin of the image. IMPRESSION: Persistent bilateral hypoinflation. Mild CHF. Some improvement in right basilar atelectasis. The support tubes are in reasonable position. Thoracic aortic atherosclerosis peer Electronically Signed   By: David  Martinique M.D.   On: 02/11/2017 12:04   Dg Chest Port 1 View  Result Date: 02/11/2017 CLINICAL DATA:  Hypoxia. EXAM: PORTABLE CHEST 1 VIEW COMPARISON:  Radiograph of February 08, 2017. FINDINGS: Stable cardiomegaly. Atherosclerosis of thoracic aorta is  noted. Hypoinflation of the lungs is noted with mild bibasilar subsegmental atelectasis. No pneumothorax is noted. Bony thorax is unremarkable. IMPRESSION: Aortic atherosclerosis. Hypoinflation of the lungs with mild bibasilar subsegmental atelectasis. Electronically Signed   By: Marijo Conception, M.D.   On: 02/11/2017 08:53   BMET    Component Value Date/Time   NA 138 02/13/2017 0216   K 4.5 02/13/2017 0216   CL 101 02/13/2017 0216   CO2 21 (L) 02/13/2017 0216   GLUCOSE 93 02/13/2017 0216   BUN 61 (H) 02/13/2017 0216   CREATININE 7.69 (H) 02/13/2017 0216   CALCIUM 8.5 (L) 02/13/2017 0216   GFRNONAA 8 (L) 02/13/2017 0216   GFRAA 9 (L) 02/13/2017 0216   CBC    Component Value Date/Time   WBC 11.4 (H) 02/13/2017 0216   RBC 3.31 (L) 02/13/2017 0216   HGB 8.7 (L) 02/13/2017 0216   HCT 28.5 (L) 02/13/2017 0216   PLT 664 (H) 02/13/2017 0216   MCV 86.1 02/13/2017 0216   MCH 26.3 02/13/2017 0216   MCHC 30.5 02/13/2017 0216   RDW 17.1 (H) 02/13/2017 0216   LYMPHSABS 0.7 02/13/2017 0216   MONOABS 1.5 (H) 02/13/2017 0216   EOSABS 0.8 (H) 02/13/2017 0216   BASOSABS 0.1 02/13/2017 0216     Assessment:  1. Abdominal pain, ? Peritonitis.  Vanco/cefipime  PD cath out  [facility] neg to date 2. ESRD  MWF [place] 3. HTN 4. Anemia on aranesp 5. DM 6. Sec HPTH sensipar/hectorol 7. Delirium  Plan: 1. HD today as did not get full tx yest due to infiltration of AVF.  If able to dialyze today will plan again on Sat and get back on MWF next week 2. DC IV fluids Videl Nobrega T

## 2017-02-13 NOTE — Progress Notes (Signed)
PT Cancellation Note  Patient Details Name: Hayden [name] MRN: [id number] DOB: [birth year]   Cancelled Treatment:    Reason Eval/Treat Not Completed: Patient at procedure or test/unavailable. Pt at HD.   Sanders 02/13/2017, 2:11 PM  [place]

## 2017-02-13 NOTE — Progress Notes (Signed)
Pharmacy Antibiotic Note  Hayden [name] is a 46 y.o. male admitted on 02/07/2017 with concern for peritonitis.  Pharmacy has been consulted for vancomycin/cefepime dosing. Blood cultures are negative, MRSA PCR negative, but a surgical pcr screen on 08/13 was positive for staph aureus. On 08/14 he went to the OR and had his PD cath removed, but became hypoxic and was transferred to the ICU. WBC's have been stable and he is afebrile.  Yesterday he was unable to undergo HD as his AVF was infiltrated. He is currently undergoing dialysis today, then Saturday, and will return to a MWF schedule. Prior to his HD today, a vancomycin random was drawn and resulted at 16.  Plan: Cefepime 1 gram IV Q 24 hours Vancomycin 1 gram IV after dialysis on Thursday and Saturday Follow up LOT, cultures, clinical status Pre-HD vancomycin level if needed  Height: 5\' 9"  (175.3 cm) Weight: 204 lb 2.3 oz (92.6 kg) IBW/kg (Calculated) : 70.7  Temp (24hrs), Avg:97.5 F (36.4 C), Min:97.1 F (36.2 C), Max:97.8 F (36.6 C)   Recent Labs Lab 02/07/17 1223 02/07/17 1438  02/08/17 0822  02/08/17 0824 02/09/17 2104 02/10/17 0825 02/11/17 0614 02/12/17 0249 02/13/17 0216 02/13/17 1447  WBC  --   --   < > 11.0*  --   --   --  11.4* 10.8* 10.1 11.4*  --   CREATININE  --   --   < >  --   < >  --  7.70* 8.40* 5.80* 6.37* 7.69*  --   LATICACIDVEN 1.41 3.14*  --   --   --  0.7  --   --   --   --   --   --   VANCOTROUGH  --   --   --   --   --   --   --   --   --   --   --  16  < > = values in this interval not displayed.  Estimated Creatinine Clearance: 13.8 mL/min (A) (by C-G formula based on SCr of 7.69 mg/dL (H)).    Antibiotics on this admission: Vancomycin 8/10>> Cefepime 8/10>>  Cultures: 8/13 Surgical PCR screen>>positive for staph aureus 8/10 BC x 2 >> Negative 8/10 MRSA PCR >>Negative    Patterson Hammersmith PharmD PGY1 Pharmacy Practice Resident 02/13/2017 4:07 PM Pager: [phone]

## 2017-02-13 NOTE — Progress Notes (Signed)
Patient arrived to unit per bed.  Reviewed treatment plan and this RN agrees.  Report received from bedside RN, Haleigh.  Consent verified.  Patient confused, restrained X 2. Lung sounds diminished to ausculation in all fields. Generalized non pitting edema. Cardiac: NSR, HB1.  Prepped LLAVF with alcohol and cannulated with two 16 gauge needles.  Pulsation of blood noted.  Flushed access well with saline per protocol.  Connected and secured lines and initiated tx at 1424.  UF goal of 4500 mL and net fluid removal of 4000 mL.  Will continue to monitor.

## 2017-02-13 NOTE — Progress Notes (Signed)
Dialysis treatment completed.  4500 mL ultrafiltrated and net fluid removal 4000 mL.    Patient status unchanged. Lung sounds diminished to ausculation in all fields. Generalized edema. Cardiac: ST.  Disconnected lines and removed needles.  Pressure held for 10 minutes and band aid/gauze dressing applied.  Report given to bedside RN, Haleigh.

## 2017-02-13 NOTE — Progress Notes (Signed)
PULMONARY / CRITICAL CARE MEDICINE   Name: Hayden [name] MRN: [id number] DOB: [birth year]    ADMISSION DATE:  02/07/2017 CONSULTATION DATE:  02/11/17  REFERRING MD:  Eppie Gibson  CHIEF COMPLAINT:  VDRF  HISTORY OF PRESENT ILLNESS:  Pt is encephelopathic; therefore, this HPI is obtained from chart review. Hayden [name] is a 46 y.o. 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  Of note, he was switched from PD to HD since admission in June 2018.  He had had multiple appointments at [facility] to flush and remove PD cath; however, has missed all of these.He presented to [facility]/10 with diffuse abdominal pain.  It was felt that he had peritonitis from PD cath.  He was subsequently taken to OR 8/14 for removal of PD cath.  Following procedure, he failed extubation; therefore, was transferred to ICU.  SUBJECTIVE:  Extubated, tolerating well from resp standpoint.   VITAL SIGNS: BP 120/88   Pulse 90   Temp (!) 97.2 F (36.2 C) (Axillary)   Resp (!) 22   Ht 5\' 9"  (1.753 m)   Wt 92.6 kg (204 lb 2.3 oz)   SpO2 92%   BMI 30.15 kg/m   HEMODYNAMICS:    VENTILATOR SETTINGS: Vent Mode: CPAP;PSV FiO2 (%):  [40 %] 40 % PEEP:  [5 cmH20] 5 cmH20 Pressure Support:  [5 cmH20] 5 cmH20  INTAKE / OUTPUT: I/O last 3 completed shifts: In: 3706.7 [I.V.:3491.7; IV Piggyback:215] Out: 0    PHYSICAL EXAMINATION: General: adult overweight male, agitated.  Neuro: rass 2, moving all ext. Does not follow commands. Was apparently able to respond yes/no overnight. Not now.  HEENT: [place]/AT, PERRL, no JVD PULM: CTA  CV: RRR, no MRG GI: soft, incisions dry, low BS Extremities: remote R AKA, no edema. Grossly itnact   LABS:  BMET  Recent Labs Lab 02/11/17 0614 02/12/17 0249 02/13/17 0216  NA 137 139 138  K 4.3 3.2* 4.5  CL 97* 104 101  CO2 27 22 21*  BUN 45* 53* 61*  CREATININE 5.80*  6.37* 7.69*  GLUCOSE 73 441* 93    Electrolytes  Recent Labs Lab 02/10/17 0825 02/11/17 0614 02/12/17 0249 02/13/17 0216  CALCIUM 8.3* 8.1* 7.7* 8.5*  MG  --   --  2.0  --   PHOS 11.4* 8.9* 7.4*  --     CBC  Recent Labs Lab 02/11/17 0614 02/12/17 0249 02/13/17 0216  WBC 10.8* 10.1 11.4*  HGB 8.7* 9.2* 8.7*  HCT 29.6* 29.7* 28.5*  PLT 574* 652* 664*    Coag's No results for input(s): APTT, INR in the last 168 hours.  Sepsis Markers  Recent Labs Lab 02/07/17 1223 02/07/17 1438 02/08/17 0824  LATICACIDVEN 1.41 3.14* 0.7    ABG  Recent Labs Lab 02/11/17 1310 02/12/17 0421  PHART 7.415 7.418  PCO2ART 43.9 38.8  PO2ART 302.0* 95.0    Liver Enzymes  Recent Labs Lab 02/07/17 1208 02/08/17 0823 02/10/17 0825 02/11/17 0614  AST 11*  --   --   --   ALT 11*  --   --   --   ALKPHOS 219*  --   --   --   BILITOT 0.9  --   --   --   ALBUMIN 2.8* 2.4* 2.4* 2.4*    Cardiac Enzymes  Recent Labs Lab 02/12/17 1052  TROPONINI 0.07*    Glucose  Recent  Labs Lab 02/12/17 0756 02/12/17 1136 02/12/17 1514 02/12/17 2049 02/12/17 2324 02/13/17 0825  GLUCAP 91 90 88 82 86 86    Imaging Dg Chest Port 1 View  Result Date: 02/13/2017 CLINICAL DATA:  Respiratory acidosis EXAM: PORTABLE CHEST 1 VIEW COMPARISON:  02/12/2017 FINDINGS: Should ache shadow is enlarged but stable. Aortic calcifications are again seen. Diffuse vascular congestion and pulmonary edema is noted slightly increased from the prior exam particularly on the left. Endotracheal tube and nasogastric catheter been removed in the interval. IMPRESSION: Increasing pulmonary edema. Electronically Signed   By: Inez Catalina M.D.   On: 02/13/2017 06:52     STUDIES:  CXR 8/14 > atelectasis.  CULTURES: Blood 8/10 >  Peritoneal fluid 8/14 >   ANTIBIOTICS: Vanc 8/10 >  Cefepime 8/10 >  SIGNIFICANT EVENTS: 8/10 > admit. 8/14 > to OR for removal of PD cath. 8/14- hypoxia post  op  LINES/TUBES: ETT 8/14 >   DISCUSSION: 46 y.o. male with ESRD (previously on PD but switched to HD in June), admitted 8/10 with abdominal pain.  Felt to have peritonitis due to infected PD cath.  Taken to OR 8/14 for removal of cath and failed extubation post op.  Returned to ICU on vent.  ASSESSMENT / PLAN:  PULMONARY A: Respiratory insufficiency - failed extubation post op. Pulmonary edema Atelectasis. P:   Supplemental O2 as needed to keep O2 sats > 90% Neg balance as able with HD  CARDIOVASCULAR A:  Acute on chronic diastolic CHF Hx HTN, HLD With edema, r/o ischemia P:  Monitor hemodynamics. Holding clonidine while unable to take PO Holding amlodipine, hydralazine, labetalol Continue preadmission ASA. Trop trending down.  RENAL A:   ESRD. P:   HD per renal. Planning 8/16 and 8/18 Follow BMP  GASTROINTESTINAL A:   GI prophylaxis. Nutrition. P:   SUP: Pantoprazole. NPO. May need NGT for nutrition if MS does not improve next 24-48 hours.  HEMATOLOGIC / ONCOLOGIC A:   Hx anemia of CKD, RCC s/p partial right nephrectomy. VTE Prophylaxis. P:  Transfuse for Hgb < 7. SCD's / heparin. CBC in AM.  INFECTIOUS A:   Peritonitis - due to infected PD cath. P:   Abx as above  DC vanco Follow or cultures  ENDOCRINE A:   Hx DM. P:   SSI  NEUROLOGIC A:   Acute encephalopathy - r/t ICU delirium, this has happened before while inpatient. Rage issues per wife.  Hx depression, CVA. P:   Continue depakote IV Continue thiamine. Holding escitalopram and gabapentin as he cannot take PO Scheduled lorazepam to avoid WD Holding alprazolam Check QTC and schedule haldol   Family updated: Family updated bedside.   Interdisciplinary Family Meeting v Palliative Care Meeting:  Due by: 02/17/17.  Georgann Housekeeper, AGACNP-BC [facility] Pager [phone] or [phone]  02/13/2017 9:13 AM  STAFF NOTE: Linwood Dibbles, MD FACP have  personally reviewed patient's available data, including medical history, events of note, physical examination and test results as part of my evaluation. I have discussed with resident/NP and other care providers such as pharmacist, RN and RRT. In addition, I personally evaluated patient and elicited key findings of: awake, no distress, nonfocal exam, normral strength, agitation enceph, lungs crackles, O2 needs low 4 liters, pcxr which I reviewed showed residual edema, ett out, moaning, IS as able, for HD to remove volume, remains culture neg, follow, keep current abx goal is neg balance today, consider diet advance if okay by ccs, wife reports  similar psychosis with stress in hospital, delirium, consider qtc assessment and haldol, may need NGT for his psych meds, consider depakote to 750 and 500 additoinal, mobilize as able, consider to [place]. Titus Mould, MD, [place] Pgr: [facility] 02/13/2017 9:35 AM

## 2017-02-13 NOTE — Progress Notes (Signed)
Internal Medicine Attending  Date: 02/13/2017  Patient name: Hayden [name] Medical record number: [id number] Date of birth: 12/14/1970 Age: 46 y.o. Gender: male  I saw and evaluated the patient. I reviewed the resident's note by Dr. Ronalee Red and I agree with the resident's findings and plans as documented in her progress note.  When seen on rounds this morning Hayden [name] was more alert than yesterday but no longer on the propofol. During our assessment he would vocalize the words "Baaah" "Baaah" but would not follow commands. He seemed uncomfortable and rather than sitting up in bed he angled himself so he was lying flat on the lower half of the bed with the guard rails up. I agree his encephalopathy is very likely multifactorial. The probable infectious source, namely the peritoneal dialysis catheter, has been removed and antibiotics are continuing. We will continue to adjust his psychiatric medications with a goal of achieving his baseline regimen and doses. He will also continue with hemodialysis which we hope will remove what we believe is to be excess volume. I am hopeful that with intra-abdominal source control, completion of his antibiotic regimen, return to baseline medication, and hemodialysis removing toxins and volume his mental status will improve. From what we have been told by his wife he has had similar episodes of delirium while in the hospital.

## 2017-02-13 NOTE — Progress Notes (Signed)
Subjective:  No acute events overnight. Patient was extubated yesterday. Only received 71min of HD yesterday. Will get HD today and then again on Saturday.  Agitated, frequent vocalizations, and not responding to questions.  Objective:  Vital signs in last 24 hours: Vitals:   02/13/17 0600 02/13/17 0700 02/13/17 0800 02/13/17 0812  BP: (!) 120/53 (!) 145/42 120/88   Pulse: 90 88 90   Resp: 18 (!) 23 (!) 22   Temp:    (!) 97.2 F (36.2 C)  TempSrc:    Axillary  SpO2: 96% 95% 92% 92%  Weight:      Height:       General: white male lying in bed in NAD moving and vocalizing frequently; not responding to questions but eyes are open Lungs: CTAB Heart: NR & RR; systolic murmur heard best at RUSB Abdomen: soft, nontender to palpation, non-distended, normal bowel sounds, no rebound or guarding. Post-op catheter site without surrounding erythema Extremities: No LLE edema, R AKA  Labs CBC Latest Ref Rng & Units 02/13/2017 02/12/2017 02/11/2017  WBC 4.0 - 10.5 K/uL 11.4(H) 10.1 10.8(H)  Hemoglobin 13.0 - 17.0 g/dL 8.7(L) 9.2(L) 8.7(L)  Hematocrit 39.0 - 52.0 % 28.5(L) 29.7(L) 29.6(L)  Platelets 150 - 400 K/uL 664(H) 652(H) 574(H)   CMP Latest Ref Rng & Units 02/13/2017 02/12/2017 02/11/2017  Glucose 65 - 99 mg/dL 93 441(H) 73  BUN 6 - 20 mg/dL 61(H) 53(H) 45(H)  Creatinine 0.61 - 1.24 mg/dL 7.69(H) 6.37(H) 5.80(H)  Sodium 135 - 145 mmol/L 138 139 137  Potassium 3.5 - 5.1 mmol/L 4.5 3.2(L) 4.3  Chloride 101 - 111 mmol/L 101 104 97(L)  CO2 22 - 32 mmol/L 21(L) 22 27  Calcium 8.9 - 10.3 mg/dL 8.5(L) 7.7(L) 8.1(L)  Total Protein 6.5 - 8.1 g/dL - - -  Total Bilirubin 0.3 - 1.2 mg/dL - - -  Alkaline Phos 38 - 126 U/L - - -  AST 15 - 41 U/L - - -  ALT 17 - 63 U/L - - -   Assessment/Plan:  Abdominal Pain, likely secondary to peritonitis from peritoneal catheter Clinically stable and abdominal pain improving. Remains afebrile with normal vital signs. - Continue empiric  vancomycin/cefepime - Nephrology following, appreciate their help - PD cath has been removed - BCx negative  Confusion Likely multifactorial - peritonitis with no source control, multiple psychoactive medications, prior episodes of cerebral damage, delirium, possibly uremic. Patient is now on fentanyl 118mcg q2h PRN, ativan 0.5mg  BID, and depacon 500mg  BID. - Delirium precautions - Continue to monitor - HD today and Saturday - PD catheter removed - Empiric vanc/cefepime - CPAP if tolerated - Oral thiamine daily - Will work on adjusting these medications  Increased oxygen requirement Per wife, patient does not use oxygen at home. Patient intermittently desaturates to the 80s and requires [place] to be put back on. Patient has been very somnolent throughout the day here. He is not using the ordered CPAP, and I believe that some of this may be due to probable OSA. Patient does have a history of HFpEF, but he does not have LE edema and does not have crackles on my lung exam. - Continue to encourage CPAP as able - Continue to monitor - albuterol nebulizers q6h (QTc 404/46ms) - trop 0.07, but this is around his baseline  ESRD on HD MWF Appears to have missed HD sessions in the past per nephrology. Patient only received 92min of HD yesterday. - Nephrology following, appreciate their help - HD today  and Saturday - Continue calcitriol, ferric citrate, cinacalcet  CHF (last echo 10/2016 with EF 60-65% and grade 2 DD) - continue home labetalol, isosorbide dinitrate, and aspirin - Continue HD, per nephrology  HTN sBP 120s-140s - continue home clonidine  Probable OSA CPAP ordered but patient not using  Dispo: Anticipated discharge, pending work-up  Colbert Ewing, MD  Internal Medicine, PGY-1 02/13/2017, 10:14 AM P [phone]

## 2017-02-13 NOTE — Progress Notes (Signed)
MD notified regarding pts BP elevated SBP >200 despite 20 mg hydralazine administered. MD ordered one time dose of 5 mg IV lopressor. Orders carried out. Nursing will continue to monitor.

## 2017-02-13 NOTE — Care Management Note (Signed)
Case Management Note  Patient Details  Name: Hayden [name] MRN: [id number] Date of Birth: 09-08-1970  Subjective/Objective:  Pt admitted with PD catheter infection                  Action/Plan:   PTA from home with wife.  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  Pt is in full restraints with a sitter at bedside.  Per wife pt is able to pivot independently in the home and uses a wheelchair.  Wife is declining [place] as recommended at this time stating "they came out and did an assessment right after the surgery, they said he didn't need it".  CM will continue to follow for discharge needs   Expected Discharge Date:  02/10/17               Expected Discharge Plan:  Home/Self Care  In-House Referral:     Discharge planning Services  CM Consult  Post Acute Care Choice:    Choice offered to:     DME Arranged:    DME Agency:     HH Arranged:    HH Agency:     Status of Service:     If discussed at [employer], dates discussed:    Additional Comments:  Maryclare Labrador, RN 02/13/2017, 12:13 PM

## 2017-02-14 ENCOUNTER — Encounter (HOSPITAL_COMMUNITY): Payer: Self-pay | Admitting: Radiology

## 2017-02-14 ENCOUNTER — Inpatient Hospital Stay (HOSPITAL_COMMUNITY): Payer: Medicare Other

## 2017-02-14 DIAGNOSIS — N186 End stage renal disease: Secondary | ICD-10-CM

## 2017-02-14 DIAGNOSIS — Z992 Dependence on renal dialysis: Secondary | ICD-10-CM

## 2017-02-14 LAB — COMPREHENSIVE METABOLIC PANEL
ALK PHOS: 293 U/L — AB (ref 38–126)
ALT: 16 U/L — ABNORMAL LOW (ref 17–63)
ANION GAP: 14 (ref 5–15)
AST: 18 U/L (ref 15–41)
Albumin: 2.6 g/dL — ABNORMAL LOW (ref 3.5–5.0)
BILIRUBIN TOTAL: 1.4 mg/dL — AB (ref 0.3–1.2)
BUN: 30 mg/dL — ABNORMAL HIGH (ref 6–20)
CALCIUM: 8.6 mg/dL — AB (ref 8.9–10.3)
CO2: 25 mmol/L (ref 22–32)
Chloride: 97 mmol/L — ABNORMAL LOW (ref 101–111)
Creatinine, Ser: 5.16 mg/dL — ABNORMAL HIGH (ref 0.61–1.24)
GFR calc non Af Amer: 12 mL/min — ABNORMAL LOW (ref >60)
GFR, EST AFRICAN AMERICAN: 14 mL/min — AB (ref >60)
Glucose, Bld: 94 mg/dL (ref 65–99)
Potassium: 3.9 mmol/L (ref 3.5–5.1)
Sodium: 136 mmol/L (ref 135–145)
TOTAL PROTEIN: 6.3 g/dL — AB (ref 6.5–8.1)

## 2017-02-14 LAB — GLUCOSE, CAPILLARY
Glucose-Capillary: 89 mg/dL (ref 65–99)
Glucose-Capillary: 81 mg/dL (ref 65–99)
Glucose-Capillary: 102 mg/dL — ABNORMAL HIGH (ref 65–99)
Glucose-Capillary: 116 mg/dL — ABNORMAL HIGH (ref 65–99)
Glucose-Capillary: 106 mg/dL — ABNORMAL HIGH (ref 65–99)

## 2017-02-14 LAB — CBC WITH DIFFERENTIAL/PLATELET
BASOS ABS: 0.1 10*3/uL (ref 0.0–0.1)
BASOS PCT: 0 %
EOS ABS: 0.2 10*3/uL (ref 0.0–0.7)
Eosinophils Relative: 1 %
HEMATOCRIT: 32.7 % — AB (ref 39.0–52.0)
HEMOGLOBIN: 10.2 g/dL — AB (ref 13.0–17.0)
Lymphocytes Relative: 4 %
Lymphs Abs: 0.6 10*3/uL — ABNORMAL LOW (ref 0.7–4.0)
MCH: 26.8 pg (ref 26.0–34.0)
MCHC: 31.2 g/dL (ref 30.0–36.0)
MCV: 86.1 fL (ref 78.0–100.0)
MONO ABS: 1.2 10*3/uL — AB (ref 0.1–1.0)
Monocytes Relative: 9 %
NEUTROS ABS: 12.2 10*3/uL — AB (ref 1.7–7.7)
NEUTROS PCT: 86 %
Platelets: 743 10*3/uL — ABNORMAL HIGH (ref 150–400)
RBC: 3.8 MIL/uL — ABNORMAL LOW (ref 4.22–5.81)
RDW: 17 % — AB (ref 11.5–15.5)
WBC: 14.3 10*3/uL — ABNORMAL HIGH (ref 4.0–10.5)

## 2017-02-14 MED ORDER — METOPROLOL TARTRATE 5 MG/5ML IV SOLN
5.0000 mg | Freq: Once | INTRAVENOUS | Status: AC
  Administered 2017-02-14: 5 mg via INTRAVENOUS
  Filled 2017-02-14: qty 5

## 2017-02-14 MED ORDER — HEPARIN SODIUM (PORCINE) 5000 UNIT/ML IJ SOLN
5000.0000 [IU] | Freq: Three times a day (TID) | INTRAMUSCULAR | Status: DC
  Administered 2017-02-14 – 2017-02-16 (×7): 5000 [IU] via SUBCUTANEOUS
  Filled 2017-02-14 (×9): qty 1

## 2017-02-14 MED ORDER — METOPROLOL TARTRATE 5 MG/5ML IV SOLN
INTRAVENOUS | Status: AC
  Administered 2017-02-14: 5 mg via INTRAVENOUS
  Filled 2017-02-14: qty 5

## 2017-02-14 MED ORDER — LABETALOL HCL 300 MG PO TABS
300.0000 mg | ORAL_TABLET | Freq: Two times a day (BID) | ORAL | Status: DC
  Administered 2017-02-14 – 2017-02-17 (×7): 300 mg via ORAL
  Filled 2017-02-14 (×8): qty 1

## 2017-02-14 MED ORDER — AMLODIPINE BESYLATE 10 MG PO TABS
10.0000 mg | ORAL_TABLET | Freq: Every day | ORAL | Status: DC
  Administered 2017-02-14 – 2017-02-17 (×4): 10 mg via ORAL
  Filled 2017-02-14 (×5): qty 1

## 2017-02-14 MED ORDER — PANTOPRAZOLE SODIUM 40 MG PO TBEC
40.0000 mg | DELAYED_RELEASE_TABLET | Freq: Every day | ORAL | Status: DC
  Administered 2017-02-15 – 2017-02-17 (×3): 40 mg via ORAL
  Filled 2017-02-14 (×3): qty 1

## 2017-02-14 MED ORDER — ASPIRIN 81 MG PO CHEW
81.0000 mg | CHEWABLE_TABLET | Freq: Every day | ORAL | Status: DC
  Administered 2017-02-15 – 2017-02-17 (×3): 81 mg via ORAL
  Filled 2017-02-14 (×3): qty 1

## 2017-02-14 MED ORDER — HYDRALAZINE HCL 50 MG PO TABS
100.0000 mg | ORAL_TABLET | Freq: Two times a day (BID) | ORAL | Status: DC
  Administered 2017-02-14 – 2017-02-15 (×3): 100 mg via ORAL
  Filled 2017-02-14 (×6): qty 2

## 2017-02-14 MED ORDER — PRAVASTATIN SODIUM 20 MG PO TABS
20.0000 mg | ORAL_TABLET | Freq: Every day | ORAL | Status: DC
  Administered 2017-02-15 – 2017-02-17 (×3): 20 mg via ORAL
  Filled 2017-02-14 (×3): qty 1

## 2017-02-14 MED ORDER — GABAPENTIN 100 MG PO CAPS
100.0000 mg | ORAL_CAPSULE | Freq: Three times a day (TID) | ORAL | Status: DC | PRN
  Administered 2017-02-14: 100 mg via ORAL
  Filled 2017-02-14 (×2): qty 1

## 2017-02-14 MED ORDER — ISOSORBIDE DINITRATE 10 MG PO TABS
5.0000 mg | ORAL_TABLET | Freq: Two times a day (BID) | ORAL | Status: DC
  Administered 2017-02-14 – 2017-02-17 (×7): 5 mg via ORAL
  Filled 2017-02-14 (×8): qty 1

## 2017-02-14 MED ORDER — DIVALPROEX SODIUM 500 MG PO DR TAB
500.0000 mg | DELAYED_RELEASE_TABLET | Freq: Two times a day (BID) | ORAL | Status: DC
  Administered 2017-02-14 – 2017-02-17 (×7): 500 mg via ORAL
  Filled 2017-02-14 (×8): qty 1

## 2017-02-14 MED ORDER — METOPROLOL TARTRATE 5 MG/5ML IV SOLN
5.0000 mg | Freq: Once | INTRAVENOUS | Status: AC
  Administered 2017-02-14: 5 mg via INTRAVENOUS

## 2017-02-14 MED ORDER — IOPAMIDOL (ISOVUE-370) INJECTION 76%
INTRAVENOUS | Status: AC
Start: 2017-02-14 — End: 2017-02-14
  Administered 2017-02-14: 80 mL
  Filled 2017-02-14: qty 100

## 2017-02-14 MED ORDER — ALPRAZOLAM 0.5 MG PO TABS
0.5000 mg | ORAL_TABLET | Freq: Three times a day (TID) | ORAL | Status: DC | PRN
  Administered 2017-02-14 – 2017-02-16 (×5): 0.5 mg via ORAL
  Filled 2017-02-14 (×5): qty 1

## 2017-02-14 NOTE — Progress Notes (Signed)
Pt. Unable to wear cpap at this time due to being restless and agitated. RN states pt. Won't wear at this time as well. RT and RN will continue to monitor.

## 2017-02-14 NOTE — Progress Notes (Signed)
Internal Medicine Attending  Date: 02/14/2017  Patient name: Hayden [name] Medical record number: [id number] Date of birth: 28-Jan-1971 Age: 46 y.o. Gender: male  I saw and evaluated the patient. I reviewed the resident's note by Dr. Ronalee Red and I agree with the resident's findings and plans as documented in her progress note.  When seen on rounds this morning Hayden [name]' mental status was slightly improved in that he would respond to some questions and verbalize some words. Nonetheless, his overall picture was one of agitation and delirium. He had been hypertensive overnight likely secondary to rebound hypertension as the previous clonidine patch must have fallen off. He had a brief episode of atrial fibrillation that was likely related to his hypertension but will be further evaluated with an echocardiogram, a troponin and TSH in the morning. We are holding off on full anticoagulation and monitoring him on telemetry to see if the transient atrial fibrillation returns once we have better control of the blood pressure.. If it does we will then start full dose anticoagulation. Now that he is able to take in via the mouth we will restart his oral medications which I believe will help with both the blood pressure and the delirium. With regards to his presumed peritonitis, the catheter has been removed and he is continuing on antibiotics. He will likely need a 5-7 day course from the time the catheter was extracted. We will continue current supportive care and transfer out to the stepdown unit when appropriate.

## 2017-02-14 NOTE — Progress Notes (Signed)
Physical Therapy Treatment Patient Details Name: Hayden [name] MRN: [id number] DOB: 02/05/1971 Today's Date: 02/14/2017    History of Present Illness [name] is a 46yo male with PMH significant for ESRD on MWF HD (was on peritoneal dialysis for a period of time a few months ago), kidney cancer s/p partial right nephrectomy, history of T2DM s/p R AKA, CHF (last echo 10/2016 with EF 60-65%), obesity, HTN, and HLD who presents with 3 weeks of worsening diffuse abdominal pain with peritoneal catheter that has not been cared for in ~1 month, most concerning for peritonitis.    PT Comments    Pt remains limited by cognition and lethargy. Expect mobility will improve significantly if cognition improves.   Follow Up Recommendations  Supervision/Assistance - 24 hour (Home vs SNF pending cognitive improvement)     Equipment Recommendations  None recommended by PT    Recommendations for Other Services       Precautions / Restrictions Precautions Precautions: Fall Restrictions Weight Bearing Restrictions: No    Mobility  Bed Mobility Overal bed mobility: Needs Assistance Bed Mobility: Supine to Sit;Sit to Supine     Supine to sit: Max assist;+2 for physical assistance Sit to supine: Max assist;+2 for physical assistance   General bed mobility comments: Cues for initiation and sequencing. Pt able to pull up with bil UEs on therapists hands to initaite coming from supine to sit.  Transfers                 General transfer comment: NT due to lethargy  Ambulation/Gait                 Stairs            Wheelchair Mobility    Modified Rankin (Stroke Patients Only)       Balance Overall balance assessment: Needs assistance;History of Falls Sitting-balance support: Feet supported;Bilateral upper extremity supported Sitting balance-Leahy Scale: Poor Sitting balance - Comments: Sat EOB x 8-10 minutes with min assist. Pt falling asleep in sitting.  Occasionally would respond to verbal/tactile stimuli.                                    Cognition Arousal/Alertness: Lethargic Behavior During Therapy: Restless;Flat affect Overall Cognitive Status: Impaired/Different from baseline Area of Impairment: Attention;Following commands;Memory;Safety/judgement;Problem solving                   Current Attention Level: Focused Memory: Decreased short-term memory Following Commands: Follows one step commands inconsistently;Follows one step commands with increased time Safety/Judgement: Decreased awareness of safety;Decreased awareness of deficits   Problem Solving: Slow processing;Decreased initiation;Difficulty sequencing;Requires verbal cues;Requires tactile cues General Comments: Pt falling asleep in sitting; wife reports pt sleeps seated at home. Pt intermittently following commands appropriately and occasionally answering questions      Exercises      General Comments        Pertinent Vitals/Pain Pain Assessment: Faces Faces Pain Scale: No hurt    Home Living Family/patient expects to be discharged to:: Private residence Living Arrangements: Spouse/significant other;Children;Other relatives Available Help at Discharge: Family;Available 24 hours/day Type of Home: Mobile home Home Access: Ramped entrance   Home Layout: One level Home Equipment: Grab bars - tub/shower;Hand held shower head;Walker - 2 wheels;Wheelchair - manual;Electric scooter;Crutches;Shower seat      Prior Function Level of Independence: Needs assistance  Gait / Transfers Assistance Needed: Primarily uses w/c and  scooter.  Does use RW occasionally. ADL's / Homemaking Assistance Needed: Wife reports pt independent with BADL and transfers     PT Goals (current goals can now be found in the care plan section) Acute Rehab PT Goals Patient Stated Goal: Wife - Return home Progress towards PT goals: Not progressing toward goals - comment  (continued lethargy)    Frequency    Min 3X/week      PT Plan Discharge plan needs to be updated    Co-evaluation PT/OT/SLP Co-Evaluation/Treatment: Yes Reason for Co-Treatment: Complexity of the patient's impairments (multi-system involvement);For patient/therapist safety;Necessary to address cognition/behavior during functional activity PT goals addressed during session: Mobility/safety with mobility OT goals addressed during session: Other (comment) (mobility)      AM-PAC PT "6 Clicks" Daily Activity  Outcome Measure  Difficulty turning over in bed (including adjusting bedclothes, sheets and blankets)?: Unable Difficulty moving from lying on back to sitting on the side of the bed? : Unable Difficulty sitting down on and standing up from a chair with arms (e.g., wheelchair, bedside commode, etc,.)?: Unable Help needed moving to and from a bed to chair (including a wheelchair)?: Total Help needed walking in hospital room?: Total Help needed climbing 3-5 steps with a railing? : Total 6 Click Score: 6    End of Session Equipment Utilized During Treatment: Oxygen Activity Tolerance: Patient limited by lethargy Patient left: in bed;with call bell/phone within reach;with family/visitor present;with nursing/sitter in room Nurse Communication: Mobility status PT Visit Diagnosis: Other abnormalities of gait and mobility (R26.89);Difficulty in walking, not elsewhere classified (R26.2);History of falling (Z91.81)     Time: 3716-9678 PT Time Calculation (min) (ACUTE ONLY): 16 min  Charges:  $Therapeutic Activity: 8-22 mins                    G Codes:       [facility] PT [place] 02/14/2017, 3:16 PM

## 2017-02-14 NOTE — Progress Notes (Addendum)
S:  Cont yells out for no obvious reason O:BP (!) 182/86   Pulse (!) 111   Temp (!) 97.5 F (36.4 C) (Axillary)   Resp (!) 21   Ht '5\' 9"'  (1.753 m)   Wt 88.6 kg (195 lb 5.2 oz)   SpO2 95%   BMI 28.84 kg/m   Intake/Output Summary (Last 24 hours) at 02/14/17 1017 Last data filed at 02/14/17 0600  Gross per 24 hour  Intake             1275 ml  Output             4000 ml  Net            -2725 ml   Weight change: -1.4 kg (-3 lb 1.4 oz) Gen: arousable but non communicative CVS: RRR 3/6 systolic M Resp: fewer basilar crackles Abd: +BS soft, ND  Ext: R AKA  No edema LLE   Lt AVF + bruit NEURO: Moves all ext, When I say Hayden [name] he responds "yeah" but that's all I get   . aspirin  81 mg Per Tube Daily  . chlorhexidine gluconate (MEDLINE KIT)  15 mL Mouth Rinse BID  . Chlorhexidine Gluconate Cloth  6 each Topical Daily  . cinacalcet  60 mg Oral Q breakfast  . cloNIDine  0.2 mg Transdermal Q Fri  . darbepoetin (ARANESP) injection - DIALYSIS  100 mcg Intravenous Q Mon-HD  . doxercalciferol  1 mcg Intravenous Q M,W,F-HD  . ferric citrate  420 mg Oral TID WC  . haloperidol lactate  4 mg Intravenous TID  . ipratropium-albuterol  3 mL Nebulization Q6H  . LORazepam  0.5 mg Intravenous BID  . mouth rinse  15 mL Mouth Rinse QID  . multivitamin  1 tablet Oral QHS  . mupirocin ointment  1 application Nasal BID  . nicotine  14 mg Transdermal Daily  . pantoprazole (PROTONIX) IV  40 mg Intravenous Daily  . pravastatin  20 mg Per Tube Daily  . sodium chloride flush  3 mL Intravenous Q12H  . sodium chloride flush  3 mL Intravenous Q12H  . thiamine  100 mg Per Tube Daily   Dg Chest Port 1 View  Result Date: 02/13/2017 CLINICAL DATA:  Respiratory acidosis EXAM: PORTABLE CHEST 1 VIEW COMPARISON:  02/12/2017 FINDINGS: Should ache shadow is enlarged but stable. Aortic calcifications are again seen. Diffuse vascular congestion and pulmonary edema is noted slightly increased from the prior exam  particularly on the left. Endotracheal tube and nasogastric catheter been removed in the interval. IMPRESSION: Increasing pulmonary edema. Electronically Signed   By: Inez Catalina M.D.   On: 02/13/2017 06:52   BMET    Component Value Date/Time   NA 136 02/14/2017 0228   K 3.9 02/14/2017 0228   CL 97 (L) 02/14/2017 0228   CO2 25 02/14/2017 0228   GLUCOSE 94 02/14/2017 0228   BUN 30 (H) 02/14/2017 0228   CREATININE 5.16 (H) 02/14/2017 0228   CALCIUM 8.6 (L) 02/14/2017 0228   GFRNONAA 12 (L) 02/14/2017 0228   GFRAA 14 (L) 02/14/2017 0228   CBC    Component Value Date/Time   WBC 14.3 (H) 02/14/2017 0228   RBC 3.80 (L) 02/14/2017 0228   HGB 10.2 (L) 02/14/2017 0228   HCT 32.7 (L) 02/14/2017 0228   PLT 743 (H) 02/14/2017 0228   MCV 86.1 02/14/2017 0228   MCH 26.8 02/14/2017 0228   MCHC 31.2 02/14/2017 0228   RDW 17.0 (H) 02/14/2017  0301   LYMPHSABS 0.6 (L) 02/14/2017 0228   MONOABS 1.2 (H) 02/14/2017 0228   EOSABS 0.2 02/14/2017 0228   BASOSABS 0.1 02/14/2017 0228     Assessment:  1. Abdominal pain, ? Peritonitis.  Vanco/cefipime  PD cath out  [facility] neg to date 2. ESRD  MWF [place] 3. HTN   I think a lot of his high BP is being driven by his agitation 4. Anemia on aranesp 5. DM 6. Sec HPTH sensipar/hectorol 7. Delirium  ? How much related to med withdrawal.  According to wife he did a similar thing last hospitalization  Plan: 1.  Will plan HD tomorrow and get back on MWF schedule next week 2. Recheck labs in AM  Hayden [name] T

## 2017-02-14 NOTE — Evaluation (Signed)
Occupational Therapy Evaluation Patient Details Name: Hayden [name] MRN: [id number] DOB: 1970-12-20 Today's Date: 02/14/2017    History of Present Illness Hayden [name] is a 46yo male with PMH significant for ESRD on MWF HD (was on peritoneal dialysis for a period of time a few months ago), kidney cancer s/p partial right nephrectomy, history of T2DM s/p R AKA, CHF (last echo 10/2016 with EF 60-65%), obesity, HTN, and HLD who presents with 3 weeks of worsening diffuse abdominal pain with peritoneal catheter that has not been cared for in ~1 month, most concerning for peritonitis.   Clinical Impression   Per wife, [name] Currently pt requires max assist +2 for bed mobility due to safety concerns and impaired cognition. Pending improvements in cognition; [name]. Pt would benefit from continued skilled OT to address established goals.    Follow Up Recommendations  Supervision/Assistance - 24 hour (pending cognitive status-home vs SNF)    Equipment Recommendations  None recommended by OT    Recommendations for Other Services       Precautions / Restrictions Precautions Precautions: Fall Restrictions Weight Bearing Restrictions: No      Mobility Bed Mobility Overal bed mobility: Needs Assistance Bed Mobility: Supine to Sit;Sit to Supine     Supine to sit: Max assist;+2 for physical assistance Sit to supine: Max assist;+2 for physical assistance   General bed mobility comments: Cues for initiation and sequencing. Pt able to pull up with bil UEs on therapists hands to initaite coming from supine to sit.  Transfers                      Balance Overall balance assessment: Needs assistance;History of Falls Sitting-balance support: Feet supported;Bilateral upper extremity supported Sitting balance-Leahy Scale: Fair Sitting balance - Comments: min assist to maintain sitting balance                                    ADL either performed or assessed with clinical judgement   ADL Overall ADL's : Needs assistance/impaired                                       General ADL Comments: Pt currently requires max assist overall for ADL due to impaired cognition and lethargy. HR initially in 140s in supine, once seated EOB-HR in low 100s. VSS throughout session.     Vision Baseline Vision/History: Wears glasses Wears Glasses: At all times Additional Comments: Needs further assessment. Wife reports pt glasses have been lost at hospital     Perception     Praxis      Pertinent Vitals/Pain Pain Assessment: Faces Faces Pain Scale: No hurt     Hand Dominance Right   Extremity/Trunk Assessment Upper Extremity Assessment Upper Extremity Assessment: Overall WFL for tasks assessed   Lower Extremity Assessment Lower Extremity Assessment: Defer to PT evaluation       Communication     Cognition Arousal/Alertness: Lethargic Behavior During Therapy: Restless;Flat affect Overall Cognitive Status: Impaired/Different from baseline Area of Impairment: Attention;Following commands;Memory;Safety/judgement;Problem solving                   Current Attention Level: Focused Memory: Decreased short-term memory Following Commands: Follows one step commands inconsistently;Follows one step commands with increased time  Safety/Judgement: Decreased awareness of safety;Decreased awareness of deficits   Problem Solving: Slow processing;Decreased initiation;Difficulty sequencing;Requires verbal cues;Requires tactile cues General Comments: Pt falling asleep in sitting; wife reports pt sleeps seated at home. Pt intermittently following commands appropriately and occasionally answering questions   General Comments       Exercises     Shoulder Instructions      Home Living Family/patient expects to be discharged to:: Private residence Living Arrangements: Spouse/significant  other;Children;Other relatives Available Help at Discharge: Family;Available 24 hours/day Type of Home: Mobile home Home Access: Ramped entrance     Home Layout: One level     Bathroom Shower/Tub: Walk-in shower;Door   [employer] Toilet: Standard     Home Equipment: Grab bars - tub/shower;Hand held Tourist information centre manager - 2 wheels;Wheelchair - manual;Electric scooter;Crutches;Shower seat          Prior Functioning/Environment Level of Independence: Needs assistance  Gait / Transfers Assistance Needed: Primarily uses w/c and scooter.  Does use RW occasionally. ADL's / Homemaking Assistance Needed: Wife reports pt independent with BADL and transfers            OT Problem List: Decreased strength;Impaired balance (sitting and/or standing);Impaired vision/perception;Decreased cognition;Decreased safety awareness;Decreased knowledge of use of DME or AE;Cardiopulmonary status limiting activity      OT Treatment/Interventions: Self-care/ADL training;Therapeutic exercise;Energy conservation;DME and/or AE instruction;Therapeutic activities;Cognitive remediation/compensation;Visual/perceptual remediation/compensation;Patient/family education;Balance training    OT Goals(Current goals can be found in the care plan section) Acute Rehab OT Goals Patient Stated Goal: Wife - Return home OT Goal Formulation: With patient/family Time For Goal Achievement: 02/28/17 Potential to Achieve Goals: Good ADL Goals Pt Will Perform Grooming: with min guard assist;sitting Pt Will Transfer to Toilet: with min assist;stand pivot transfer;bedside commode Additional ADL Goal #1: Pt will demonstrate sustained attention during ADL.  OT Frequency: Min 3X/week   Barriers to D/C:            Co-evaluation PT/OT/SLP Co-Evaluation/Treatment: Yes Reason for Co-Treatment: Complexity of the patient's impairments (multi-system involvement);Necessary to address cognition/behavior during functional activity;For  patient/therapist safety   OT goals addressed during session: Other (comment) (mobility)      AM-PAC PT "6 Clicks" Daily Activity     Outcome Measure Help from another person eating meals?: Total Help from another person taking care of personal grooming?: Total Help from another person toileting, which includes using toliet, bedpan, or urinal?: Total Help from another person bathing (including washing, rinsing, drying)?: Total Help from another person to put on and taking off regular upper body clothing?: Total Help from another person to put on and taking off regular lower body clothing?: Total 6 Click Score: 6   End of Session Equipment Utilized During Treatment: Oxygen Nurse Communication: Mobility status  Activity Tolerance: Patient limited by lethargy Patient left: in bed;with call bell/phone within reach;with bed alarm set;with family/visitor present  OT Visit Diagnosis: Other abnormalities of gait and mobility (R26.89);Unsteadiness on feet (R26.81);History of falling (Z91.81);Muscle weakness (generalized) (M62.81)                Time: 1093-2355 OT Time Calculation (min): 16 min Charges:  OT General Charges $OT Visit: 1 Procedure OT Evaluation $OT Eval Moderate Complexity: 1 Procedure G-Codes:     Ayasha Ellingsen A. Ulice Brilliant, M.S., OTR/L Pager: [place] 02/14/2017, 2:11 PM

## 2017-02-14 NOTE — Progress Notes (Signed)
Subjective:  Elevated BPs overnight - 200s/100s. Patient received IV hydralazine and IV metoprolol. Also was agitated - received 1mg  Ativan (could not give Haldol due to QT >517ms) Put on a new clonidine patch on this morning and nurse noted that he did not have his old clonidine patch on, so he may not have had it for the last couple days.  HD yesterday and Saturday. Net negative 2.7L yesterday.  Mr. Staebell is more responsive this morning. He is less agitated and responds to some questions. Not oriented - states he is at home. Does not state his name when asked bc he falls asleep.  Later in the morning, we were called to bedside for atrial fibrillation with rates in the 120s-150s according to the nurse. EKG showed afib with HR 120. We gave IV metoprolol 5mg  once. Returned to NSR after metoprolol.  Objective:  Vital signs in last 24 hours: Vitals:   02/14/17 0757 02/14/17 0800 02/14/17 0900 02/14/17 1000  BP: (!) 217/83 (S) (!) 204/95 (!) 141/59 (!) 214/185  Pulse:   (!) 109 (!) 112  Resp:  (!) 24 (!) 29 (!) 23  Temp:      TempSrc:      SpO2:   94% 94%  Weight:      Height:       General: white male lying in bed in NAD; opens his eyes and responds to some questions; otherwise falls asleep, not oriented Lungs: CTAB Heart: NR & RR; systolic murmur heard best at RUSB Abdomen: soft, nontender to palpation, non-distended, soft bowel sounds, no rebound or guarding. Post-op catheter site without surrounding erythema Extremities: No LLE edema, R AKA  Labs CBC Latest Ref Rng & Units 02/14/2017 02/13/2017 02/12/2017  WBC 4.0 - 10.5 K/uL 14.3(H) 11.4(H) 10.1  Hemoglobin 13.0 - 17.0 g/dL 10.2(L) 8.7(L) 9.2(L)  Hematocrit 39.0 - 52.0 % 32.7(L) 28.5(L) 29.7(L)  Platelets 150 - 400 K/uL 743(H) 664(H) 652(H)   CMP Latest Ref Rng & Units 02/14/2017 02/13/2017 02/12/2017  Glucose 65 - 99 mg/dL 94 93 441(H)  BUN 6 - 20 mg/dL 30(H) 61(H) 53(H)  Creatinine 0.61 - 1.24 mg/dL 5.16(H) 7.69(H) 6.37(H)    Sodium 135 - 145 mmol/L 136 138 139  Potassium 3.5 - 5.1 mmol/L 3.9 4.5 3.2(L)  Chloride 101 - 111 mmol/L 97(L) 101 104  CO2 22 - 32 mmol/L 25 21(L) 22  Calcium 8.9 - 10.3 mg/dL 8.6(L) 8.5(L) 7.7(L)  Total Protein 6.5 - 8.1 g/dL 6.3(L) - -  Total Bilirubin 0.3 - 1.2 mg/dL 1.4(H) - -  Alkaline Phos 38 - 126 U/L 293(H) - -  AST 15 - 41 U/L 18 - -  ALT 17 - 63 U/L 16(L) - -   Assessment/Plan:  Abdominal Pain, likely secondary to peritonitis from peritoneal catheter, which was removed 8/14 Clinically stable and abdominal pain improving. Remains afebrile with normal vital signs. - Continue empiric vancomycin/cefepime (PD cath removed 8/14) - Nephrology following, appreciate their help - BCx negative  Confusion Appears less confused and more responsive than yesterday. Will respond to some questions, whereas yesterday, he was not responsive at all.  Likely multifactorial - Now that we have source control of the peritonitis, I am hoping that his mental status will continue to improve. We are re-starting him on his home psychoactive medications, which were adjusted while he was in the ICU. According to his wife, he was delirious during his prior admission as well. We are putting him on delirium precautions to hopefully help  this issue. He has also had episodes of prior cerebral damage which means he has less functional reserve. My sense is that his altered mental status will take some time to continue improving. - Continue to monitor - Delirium precautions - HD Saturday - PD catheter removed 8/14 - Empiric vanc/cefepime - CPAP if tolerated - Oral thiamine daily - Restart home medications - depakote, xanax PRN, and gabapentin PRN - EKG tomorrow AM - will restart lexapro if QTc not prolonged  New onset afib On 8/17 by EKG. HR in the 120s. Given IV metoprolol 5mg  once. Returned to NSR after metoprolol. Patient does have many risk factors, including probable OSA, obesity, CKD, and hypertension.  Etiology: hyperthyroidism vs acute PE vs recent noncardiac surgery vs structural heart disease. Last echo in May 2018 with EF 60-65%, grade 2 DD with high LV filling pressure, moderate LAE, mild MS, and mild pHTN with RVSP of 67mmHg. He was initially on SQH, but unfortunately, his heparin was stopped prior to surgery and was never re-started. Given this, it is concerning that he may have had a PE, particularly with increased oxygen requirement and recent surgery. We will get CT-A first, then consider evaluating for other causes. - CT-A - consider echo, TSH - EKG tomorrow AM - telemetry - Restart SQH - Consider anticoag dose heparin pending CT-A eval  ESRD on HD MWF Net negative 2.7L yesterday. - Nephrology following, appreciate their help - HD Saturday - Continue calcitriol, ferric citrate, cinacalcet  CHF (last echo 10/2016 with EF 60-65% and grade 2 DD) - continue home labetalol, isosorbide dinitrate, and aspirin - Continue HD, per nephrology  HTN sBP in the 200s, HR in the 100s overnight. Patient was discontinued on his home BP medications post-op. Nurse also informed us that he did not have his old clonidine patch on, when she went to put the new one on, so his elevated blood pressures overnight may be a rebound effect from clonidine. - clonidine patch today 8/17 - restart home amlodipine 10 qday, hydralazine 100 BID, labetalol 300 BID, isosorbide dintrate 5mg  BID  Probable OSA CPAP ordered but patient not using  Dispo: Anticipated discharge, pending work-up  Colbert Ewing, MD  Internal Medicine, PGY-1 02/14/2017, 10:35 AM P [phone]

## 2017-02-15 ENCOUNTER — Inpatient Hospital Stay (HOSPITAL_COMMUNITY): Payer: Medicare Other

## 2017-02-15 DIAGNOSIS — I361 Nonrheumatic tricuspid (valve) insufficiency: Secondary | ICD-10-CM

## 2017-02-15 LAB — GLUCOSE, CAPILLARY
GLUCOSE-CAPILLARY: 107 mg/dL — AB (ref 65–99)
GLUCOSE-CAPILLARY: 100 mg/dL — AB (ref 65–99)
GLUCOSE-CAPILLARY: 101 mg/dL — AB (ref 65–99)
GLUCOSE-CAPILLARY: 85 mg/dL (ref 65–99)
Glucose-Capillary: 115 mg/dL — ABNORMAL HIGH (ref 65–99)

## 2017-02-15 LAB — RENAL FUNCTION PANEL
ANION GAP: 14 (ref 5–15)
Albumin: 2.3 g/dL — ABNORMAL LOW (ref 3.5–5.0)
BUN: 44 mg/dL — ABNORMAL HIGH (ref 6–20)
CALCIUM: 9.2 mg/dL (ref 8.9–10.3)
CHLORIDE: 99 mmol/L — AB (ref 101–111)
CO2: 25 mmol/L (ref 22–32)
CREATININE: 6.84 mg/dL — AB (ref 0.61–1.24)
GFR, EST AFRICAN AMERICAN: 10 mL/min — AB (ref >60)
GFR, EST NON AFRICAN AMERICAN: 9 mL/min — AB (ref >60)
Glucose, Bld: 104 mg/dL — ABNORMAL HIGH (ref 65–99)
Phosphorus: 8.8 mg/dL — ABNORMAL HIGH (ref 2.5–4.6)
Potassium: 3.9 mmol/L (ref 3.5–5.1)
SODIUM: 138 mmol/L (ref 135–145)

## 2017-02-15 LAB — ECHOCARDIOGRAM COMPLETE
HEIGHTINCHES: 69 in
WEIGHTICAEL: 3171.1 [oz_av]

## 2017-02-15 LAB — TROPONIN I
TROPONIN I: 0.25 ng/mL — AB (ref <0.03)
Troponin I: 0.23 ng/mL (ref <0.03)
Troponin I: 0.2 ng/mL (ref <0.03)

## 2017-02-15 LAB — T4, FREE: FREE T4: 0.94 ng/dL (ref 0.61–1.12)

## 2017-02-15 LAB — TSH: TSH: 4.03 u[IU]/mL (ref 0.350–4.500)

## 2017-02-15 MED ORDER — IPRATROPIUM-ALBUTEROL 0.5-2.5 (3) MG/3ML IN SOLN: 3.0000 mL | Freq: Four times a day (QID) | RESPIRATORY_TRACT | Status: DC | PRN

## 2017-02-15 MED ORDER — VITAMIN B-1 100 MG PO TABS
100.0000 mg | ORAL_TABLET | Freq: Every day | ORAL | Status: DC
  Administered 2017-02-15 – 2017-02-17 (×3): 100 mg via ORAL
  Filled 2017-02-15 (×3): qty 1

## 2017-02-15 NOTE — Progress Notes (Signed)
Pt. States he does not like his breathing tx. And does not want anymore of the breathing tx. RT explained to the pt. That if he gets distressed, he will need to take a tx to help him. Pt. Also refused cpap. Pt. States he does not want to wear a cpap either.

## 2017-02-15 NOTE — Progress Notes (Signed)
S:  Says he has pain "all over" O:BP (!) 150/40   Pulse 91   Temp 97.8 F (36.6 C)   Resp (!) 25   Ht '5\' 9"'  (1.753 m)   Wt 89.9 kg (198 lb 3.1 oz)   SpO2 98%   BMI 29.27 kg/m   Intake/Output Summary (Last 24 hours) at 02/15/17 3086 Last data filed at 02/15/17 0600  Gross per 24 hour  Intake             1730 ml  Output                0 ml  Net             1730 ml   Weight change: -4 kg (-8 lb 13.1 oz) Gen: More alert and responsive CVS: RRR 3/6 systolic M Resp: fewer basilar crackles Abd: +BS soft, ND  Ext: R AKA  No edema LLE   Lt AVF + bruit NEURO: CNI, he is able to answer most questions though on some have to ask more than once, orientation is better   . amLODipine  10 mg Oral Daily  . aspirin  81 mg Oral Daily  . chlorhexidine gluconate (MEDLINE KIT)  15 mL Mouth Rinse BID  . Chlorhexidine Gluconate Cloth  6 each Topical Daily  . cinacalcet  60 mg Oral Q breakfast  . cloNIDine  0.2 mg Transdermal Q Fri  . darbepoetin (ARANESP) injection - DIALYSIS  100 mcg Intravenous Q Mon-HD  . divalproex  500 mg Oral Q12H  . doxercalciferol  1 mcg Intravenous Q M,W,F-HD  . ferric citrate  420 mg Oral TID WC  . heparin subcutaneous  5,000 Units Subcutaneous Q8H  . hydrALAZINE  100 mg Oral BID  . ipratropium-albuterol  3 mL Nebulization Q6H  . isosorbide dinitrate  5 mg Oral BID  . labetalol  300 mg Oral BID  . mouth rinse  15 mL Mouth Rinse QID  . multivitamin  1 tablet Oral QHS  . mupirocin ointment  1 application Nasal BID  . nicotine  14 mg Transdermal Daily  . pantoprazole  40 mg Oral Daily  . pravastatin  20 mg Oral Daily  . sodium chloride flush  3 mL Intravenous Q12H  . thiamine  100 mg Oral Daily   Ct Angio Chest Pe W Or Wo Contrast  Result Date: 02/14/2017 CLINICAL DATA:  Inpatient. PE suspected. High pretest probability. Respiratory acidosis. EXAM: CT ANGIOGRAPHY CHEST WITH CONTRAST TECHNIQUE: Multidetector CT imaging of the chest was performed using the standard  protocol during bolus administration of intravenous contrast. Multiplanar CT image reconstructions and MIPs were obtained to evaluate the vascular anatomy. CONTRAST:  80 cc Isovue 370 IV . COMPARISON:  Chest radiograph from one day prior. FINDINGS: Cardiovascular: The study is low to moderate quality for the evaluation of pulmonary embolism, with evaluation limited by suboptimal contrast opacification and by motion artifact affecting the segmental and subsegmental branches. There are no filling defects in the central, lobar, segmental or subsegmental pulmonary artery branches to suggest acute pulmonary embolism. Atherosclerotic nonaneurysmal thoracic aorta. Top-normal caliber main pulmonary artery (3.1 cm diameter). Mild cardiomegaly. No significant pericardial fluid/thickening. Left main, left anterior descending, left circumflex and right coronary atherosclerosis. Mediastinum/Nodes: Stable size of calcified 1.2 cm inferior left thyroid lobe nodule. New mild right paratracheal adenopathy measuring up to 1.5 cm. New mild bilateral hilar adenopathy measuring up to 1.1 cm on the right (series 6/ image 64) and 1.3 cm on the  left (series 6/ image 69). No axillary adenopathy. Lungs/Pleura: No pneumothorax. Trace dependent right pleural effusion. No left pleural effusion. Diffuse interlobular septal thickening and prominent diffuse patchy ground-glass attenuation throughout both lungs. Mild bibasilar atelectasis. No discrete lung masses or significant pulmonary nodules in the limited aerated portions of the lungs. Upper abdomen: Small volume upper abdominal ascites. Musculoskeletal: No aggressive appearing focal osseous lesions. Mild thoracic spondylosis. Review of the MIP images confirms the above findings. IMPRESSION: 1. Limited scan.  No evidence of acute pulmonary embolism. 2. Spectrum of findings compatible with congestive heart failure, including cardiomegaly, trace dependent right pleural effusion and diffuse  interlobular septal thickening and patchy ground-glass attenuation compatible with pulmonary edema. 3. New mild mediastinal and bilateral hilar adenopathy, nonspecific, probably reactive. 4. Small volume upper abdominal ascites. 5. Left main and 3 vessel coronary atherosclerosis. Aortic Atherosclerosis (ICD10-I70.0). Electronically Signed   By: Ilona Sorrel M.D.   On: 02/14/2017 14:02   BMET    Component Value Date/Time   NA 138 02/15/2017 0425   K 3.9 02/15/2017 0425   CL 99 (L) 02/15/2017 0425   CO2 25 02/15/2017 0425   GLUCOSE 104 (H) 02/15/2017 0425   BUN 44 (H) 02/15/2017 0425   CREATININE 6.84 (H) 02/15/2017 0425   CALCIUM 9.2 02/15/2017 0425   GFRNONAA 9 (L) 02/15/2017 0425   GFRAA 10 (L) 02/15/2017 0425   CBC    Component Value Date/Time   WBC 14.3 (H) 02/14/2017 0228   RBC 3.80 (L) 02/14/2017 0228   HGB 10.2 (L) 02/14/2017 0228   HCT 32.7 (L) 02/14/2017 0228   PLT 743 (H) 02/14/2017 0228   MCV 86.1 02/14/2017 0228   MCH 26.8 02/14/2017 0228   MCHC 31.2 02/14/2017 0228   RDW 17.0 (H) 02/14/2017 0228   LYMPHSABS 0.6 (L) 02/14/2017 0228   MONOABS 1.2 (H) 02/14/2017 0228   EOSABS 0.2 02/14/2017 0228   BASOSABS 0.1 02/14/2017 0228     Assessment:  1. Abdominal pain, ? Peritonitis.  Vanco/cefipime  PD cath out  [facility] neg to date 2. ESRD  MWF [place] 3. HTN   I think a lot of his high BP is being driven by his agitation 4. Anemia on aranesp 5. DM 6. Sec HPTH sensipar/hectorol 7. Delirium, improving  Plan: 1.  HD today and then Mom to get back on MWF schedule  Tolbert Matheson T

## 2017-02-15 NOTE — Progress Notes (Signed)
Dialysis treatment completed.  4000 mL ultrafiltrated and net fluid removal 3500 mL.    Patient status unchanged. Lung sounds diminished to ausculation in all fields. Generalized edema. Cardiac: NSR.  Disconnected lines and removed needles.  Pressure held for 10 minutes and band aid/gauze dressing applied.  Report given to bedside RN, Alyse Low.

## 2017-02-15 NOTE — Discharge Summary (Signed)
Name: Hayden [name] MRN: [id number] DOB: [birth year] 46 y.o. PCP: Hayden Parish, MD  Date of Admission: 02/07/2017  2:16 PM Date of Discharge: 02/17/2017 Attending Physician: Oval Linsey, MD  Discharge Diagnosis: 1. Peritonitis, secondary to old peritoneal dialysis catheter 2. Acute encephalopathy 3. New onset atrial fibrillation  Active Problems:   Pressure injury of skin   Abdominal pain   Sepsis ([place])   Depression   Metabolic encephalopathy   SOB (shortness of breath)   Hypoxia   Respiratory difficulty   ESRD on hemodialysis [facility])  Discharge Medications: Allergies as of 02/17/2017      Reactions   Dilaudid [hydromorphone Hcl] Other (See Comments)   ABNORMAL BEHAVIORS "VERBALLY AND PHYSICALLY ABUSIVE" PSYCHOSIS   Morphine And Related Itching, Other (See Comments)   Adhesive [tape] Other (See Comments)   Redness from adhesive tape if left on too long, paper tape is preferred      Medication List    STOP taking these medications   escitalopram 10 MG tablet Commonly known as:  LEXAPRO   HYDROcodone-acetaminophen 10-325 MG tablet Commonly known as:  NORCO     TAKE these medications   ALPRAZolam 1 MG tablet Commonly known as:  XANAX Take 1 mg by mouth 3 (three) times daily.   amLODipine 10 MG tablet Commonly known as:  NORVASC Take 1 tablet (10 mg total) by mouth daily.   aspirin EC 81 MG tablet Take 81 mg by mouth daily. What changed:  Another medication with the same name was removed. Continue taking this medication, and follow the directions you see here.   calcitRIOL 0.5 MCG capsule Commonly known as:  ROCALTROL Take 1 capsule (0.5 mcg total) by mouth daily.   cinacalcet 60 MG tablet Commonly known as:  SENSIPAR Take 1 tablet (60 mg total) by mouth daily.   cloNIDine 0.2 mg/24hr patch Commonly known as:  CATAPRES - Dosed in mg/24 hr Place 1 patch (0.2 mg total) onto the skin once a week. What changed:  when to take this   divalproex  125 MG capsule Commonly known as:  DEPAKOTE SPRINKLE Take 4 capsules (500 mg total) by mouth every 12 (twelve) hours.   ferric citrate 1 GM 210 MG(Fe) tablet Commonly known as:  AURYXIA Take 2 tablets (420 mg total) by mouth 3 (three) times daily with meals.   furosemide 80 MG tablet Commonly known as:  LASIX Take 80 mg by mouth 2 (two) times daily.   gabapentin 100 MG capsule Commonly known as:  NEURONTIN Take 1 capsule (100 mg total) by mouth 3 (three) times daily.   hydrALAZINE 100 MG tablet Commonly known as:  APRESOLINE Take 1 tablet (100 mg total) by mouth every 8 (eight) hours. What changed:  when to take this   isosorbide dinitrate 5 MG tablet Commonly known as:  ISORDIL Take 1 tablet (5 mg total) by mouth 2 (two) times daily. What changed:  how much to take   labetalol 300 MG tablet Commonly known as:  NORMODYNE Take 1 tablet (300 mg total) by mouth 2 (two) times daily.   LORazepam 1 MG tablet Commonly known as:  ATIVAN Take 1 tablet (1 mg total) by mouth every 6 (six) hours as needed for anxiety or sleep.   methocarbamol 500 MG tablet Commonly known as:  ROBAXIN Take 1 tablet (500 mg total) by mouth every 6 (six) hours as needed for muscle spasms. What changed:  when to take this   multivitamin Tabs tablet Take 1 tablet  by mouth at bedtime.   mupirocin ointment 2 % Commonly known as:  BACTROBAN Apply 1 application topically See admin instructions. Apply to wound on foot three times daily   omeprazole 40 MG capsule Commonly known as:  PRILOSEC Take 1 capsule (40 mg total) by mouth daily.   pravastatin 20 MG tablet Commonly known as:  PRAVACHOL Take 1 tablet (20 mg total) by mouth daily.       Disposition and follow-up:   [name] was discharged from [facility] in Good condition.  At the hospital follow up visit please address:  1.  - Any abdominal pain? Is his abdominal surgical site healing well?  2.  Patient was  noted to be in atrial fibrillation that returned to NSR with one dose of IV metoprolol 5mg . He remained in NSR the remainder of his hospitalization. This was likely related to [facility] withdrawal from not having his clonidine patch. His work-up has been normal otherwise. Please continue to monitor.  3.  Patient was noted to snore and to desaturate to the mid 80s while sleeping. Had occasional daytime somnolence. CPAP was ordered but patient did not use it. Consider sleep study for diagnosis of possible OSA.  4.  QTc was 549msec on discharge. We did not restart his home Lexapro prior to discharge. Please assess and consider re-starting his Lexapro 20mg  daily.  5.  Labs / imaging needed at time of follow-up: None  6.  Pending labs/ test needing follow-up: None  Follow-up Appointments: Follow-up Information    Hayden Parish, MD Follow up in 2 week(s).   Specialty:  Nephrology Contact information: [place] [address] [phone]           Hospital Course by problem list: Active Problems:   Pressure injury of skin   Abdominal pain   Sepsis ([place])   Depression   Metabolic encephalopathy   SOB (shortness of breath)   Hypoxia   Respiratory difficulty   ESRD on hemodialysis ([place])   1. Peritonitis, secondary to peritoneal dialysis catheter that had not been cared for in >80month Patient initially presented on 8/10 for 3 weeks of worsening diffuse abdominal pain. He had recently been switched from peritoneal dialysis to hemodialysis but had missed several appointments to care for and flush his PD catheter. The PD catheter was supposed to be removed on 8/9, but the patient did not make it to that appointment. Vital signs were stable and no leukocytosis. He was started on vancomycin/cefepime and the catheter was removed on 8/14. He remained afebrile throughout his admission, and the abdominal pain continued to improve throughout his hospitalization. Cultures were not drawn from  his PD catheter, so he was continued on vancomycin and cefepime for 5 days after source control (stop date 8/20).  2. Acute encephalopathy On admission, [name] was very somnolent. He would wake up to answer one question and then fall right back asleep. He became increasingly more confused throughout his stay, which was initially felt to be due to peritonitis with no source control, multiple psychoactive medications, prior cerebral damage with less functional reserve, and delirium. Per the patient's family, he had altered mental status on his last admission in June as well. The PD catheter was removed on 8/14. He remained intubated post-operatively due to concern for desaturations prior to extubation and was transferred to the ICU for a short period. He was successfully extubated on 8/15 and continued to be altered. It was felt that his AMS  at this time was due to residual sedating medications from surgery and intubation, resolving peritonitis, and delirium. On the morning of 8/18, he showed marked improvement and was able to be transferred to the floor. At discharge, his mental status was back to baseline. He was continued on his home depakote, xanax PRN, and gabapentin PRN. His home lexapro was not able to be restarted due to prolonged QTc (583msec).  3. New onset atrial fibrillation While he was in the ICU, his home BP medications were held and he was started on IV hydralazine as needed. His blood pressure was elevated to the 200s/100s despite hydralazine the night before and the morning of 8/17. This was felt to be due to rebound hypertension because a new clonidine patch was placed and it was noted that he did not have another old clonidine patch on at the time, which was likely taken off prior to his surgery. After replacement of his clonidine patch, his BP showed some improvement, however he was then noted to be in atrial fibrillation with RVR on EKG. HR in the 120s. He was given IV metoprolol and  converted to NSR. TSH and free T4 were normal, echo with LVEF 60-65%, grade 3 DD and moderate AS, and CT-A was negative for acute PE. It was felt that his new onset transient atrial fibrillation was likely due to acute hypertension in the setting of clonidine withdrawal. Troponin 0.07 -> 0.25 -> 0.23 -> 0.20, which was felt to be secondary to demand ischemia (baseline ~0.05-0.1). EKG without ST changes. He remained in NSR throughout the rest of his hospital stay and was not placed on full dose anticoagulation.  4. ESRD, on HD MWF Nephrology was consulted and he received HD on his home schedule.  Discharge Vitals:   BP (!) 215/92 Comment: MD notified and AM meds given   Pulse 80   Temp 98 F (36.7 C) (Oral)   Resp 17   Ht 5\' 9"  (1.753 m)   Wt 179 lb 10.8 oz (81.5 kg)   SpO2 99%   BMI 26.53 kg/m   Pertinent Labs, Studies, and Procedures:  CBC Latest Ref Rng & Units 02/17/2017 02/14/2017 02/13/2017  WBC 4.0 - 10.5 K/uL 11.0(H) 14.3(H) 11.4(H)  Hemoglobin 13.0 - 17.0 g/dL 9.2(L) 10.2(L) 8.7(L)  Hematocrit 39.0 - 52.0 % 30.7(L) 32.7(L) 28.5(L)  Platelets 150 - 400 K/uL 692(H) 743(H) 664(H)   CMP Latest Ref Rng & Units 02/17/2017 02/15/2017 02/14/2017  Glucose 65 - 99 mg/dL 98 104(H) 94  BUN 6 - 20 mg/dL 39(H) 44(H) 30(H)  Creatinine 0.61 - 1.24 mg/dL 6.16(H) 6.84(H) 5.16(H)  Sodium 135 - 145 mmol/L 139 138 136  Potassium 3.5 - 5.1 mmol/L 4.0 3.9 3.9  Chloride 101 - 111 mmol/L 101 99(L) 97(L)  CO2 22 - 32 mmol/L 27 25 25   Calcium 8.9 - 10.3 mg/dL 8.5(L) 9.2 8.6(L)  Total Protein 6.5 - 8.1 g/dL - - 6.3(L)  Total Bilirubin 0.3 - 1.2 mg/dL - - 1.4(H)  Alkaline Phos 38 - 126 U/L - - 293(H)  AST 15 - 41 U/L - - 18  ALT 17 - 63 U/L - - 16(L)   Lipase 93 Troponin 0.07 -> 0.25 -> 0.23 -> 0.20 (baseline ~0.05-0.1) TSH 4.03, free T4 0.94  CT abdomen/pelvis 8/10 1. No acute abnormality abdomen or pelvis. No finding to explain the patient's symptoms. 2. Cardiomegaly. 3. Extensive calcific  aortic and coronary atherosclerosis. Calcification of the mitral annulus and aortic valve also noted. 4. Body wall  edema compatible with volume overload. 5. Peritoneal dialysis catheter in place with associated small volume of abdominal and pelvic fluid.  CT-A chest 8/17 1. Limited scan.  No evidence of acute pulmonary embolism. 2. Spectrum of findings compatible with congestive heart failure, including cardiomegaly, trace dependent right pleural effusion and diffuse interlobular septal thickening and patchy ground-glass attenuation compatible with pulmonary edema. 3. New mild mediastinal and bilateral hilar adenopathy, nonspecific, probably reactive. 4. Small volume upper abdominal ascites. 5. Left main and 3 vessel coronary atherosclerosis.  Echo 8/18 - LVEF 60-65% - Grade 3 diastolic dysfunction - Aortic stenosis has mildly advanced since prior study - Moderately calcified mitral valve annulus. - Moderate tricuspid valve regurgitation - PA peak pressure 95mmHg  Discharge Instructions: Discharge Instructions    Call MD for:  difficulty breathing, headache or visual disturbances    Complete by:  As directed    Call MD for:  extreme fatigue    Complete by:  As directed    Call MD for:  persistant nausea and vomiting    Complete by:  As directed    Call MD for:  redness, tenderness, or signs of infection (pain, swelling, redness, odor or green/yellow discharge around incision site)    Complete by:  As directed    Call MD for:  severe uncontrolled pain    Complete by:  As directed    Call MD for:  temperature >100.4    Complete by:  As directed    Diet - low sodium heart healthy    Complete by:  As directed    Increase activity slowly    Complete by:  As directed       Signed: Colbert Ewing, MD 02/17/2017, 2:39 PM   Pager: Mamie Nick [phone]

## 2017-02-15 NOTE — Progress Notes (Addendum)
Subjective:  Agitated overnight, requiring restraints x2. He shows marked improvement this morning. Denies chest pain or shortness of breath. Denies abdominal pain. Wife reports that he has been able to keep down clear liquid diet x2 yesterday. Asking for jello this morning.  HD today.  Objective:  Vital signs in last 24 hours: Vitals:   02/15/17 0400 02/15/17 0447 02/15/17 0500 02/15/17 0600  BP: (!) 137/104   (!) 150/40  Pulse: 90   91  Resp: (!) 21   (!) 25  Temp:  97.8 F (36.6 C)    TempSrc:      SpO2: 99%   98%  Weight:   198 lb 3.1 oz (89.9 kg)   Height:       General: white male sitting up in bed in NAD; alert; responds to questions appropriately Lungs: CTAB, no increased work of breathing; on 4L [place] Heart: NR & RR; systolic murmur heard best at RUSB, no LLE edema Abdomen: soft, nontender to palpation, non-distended, soft bowel sounds, no rebound or guarding. Extremities: No LLE edema, R AKA  Labs CBC Latest Ref Rng & Units 02/14/2017 02/13/2017 02/12/2017  WBC 4.0 - 10.5 K/uL 14.3(H) 11.4(H) 10.1  Hemoglobin 13.0 - 17.0 g/dL 10.2(L) 8.7(L) 9.2(L)  Hematocrit 39.0 - 52.0 % 32.7(L) 28.5(L) 29.7(L)  Platelets 150 - 400 K/uL 743(H) 664(H) 652(H)   CMP Latest Ref Rng & Units 02/15/2017 02/14/2017 02/13/2017  Glucose 65 - 99 mg/dL 104(H) 94 93  BUN 6 - 20 mg/dL 44(H) 30(H) 61(H)  Creatinine 0.61 - 1.24 mg/dL 6.84(H) 5.16(H) 7.69(H)  Sodium 135 - 145 mmol/L 138 136 138  Potassium 3.5 - 5.1 mmol/L 3.9 3.9 4.5  Chloride 101 - 111 mmol/L 99(L) 97(L) 101  CO2 22 - 32 mmol/L 25 25 21(L)  Calcium 8.9 - 10.3 mg/dL 9.2 8.6(L) 8.5(L)  Total Protein 6.5 - 8.1 g/dL - 6.3(L) -  Total Bilirubin 0.3 - 1.2 mg/dL - 1.4(H) -  Alkaline Phos 38 - 126 U/L - 293(H) -  AST 15 - 41 U/L - 18 -  ALT 17 - 63 U/L - 16(L) -   TSH 4.030, free T4 0.94 Trop 0.07 -> 0.25  Assessment/Plan:  Abdominal Pain, likely secondary to peritonitis from peritoneal catheter, removed 8/14, resolving Patient  denies abdominal pain this morning and has been tolerating clear liquid diet yesterday. Remains afebrile with normal vital signs. PD cath removed 8/14; no cultures drawn - Continue empiric vancomycin/cefepime for 5-7d from date of source control - Advance diet as tolerated  Confusion, resolving Likely multifactorial - Now that we have source control of the peritonitis, it appears that his mental status has greatly improved. He is now responding to all my questions and requesting food. We have continued his home psychoactive medications, except for lexapro, which we will try to start today (pending QTc check). According to his wife, he was delirious during his prior admission as well. We are putting him on delirium precautions which appears to have helped.  Wife reports that he is not all the way back to baseline but I am hopeful that his mental status will continue to improve with time. - Continue to monitor - transfer to the floor today - dc restraints later today if patient is not agitated - Delirium precautions - HD today - PD catheter removed 8/14 - Continue empiric vanc/cefepime - Continue respiratory therapy - CPAP if tolerated - Oral thiamine daily - Restart home medications - depakote, xanax PRN, and gabapentin PRN - EKG  today - will restart home lexapro if QTc not prolonged  New onset afib, now in NSR Returned to NSR after IV metoprolol 5. BP now under better control after resuming home meds. CT-A negative for acute PE. TSH normal. Echo today (Last echo in May 2018 with EF 60-65%, grade 2 DD with high LV filling pressure, moderate LAE, mild MS, and mild pHTN with RVSP of 9mmHg). Troponin 0.07 -> 0.25. - f/u echo - f/u EKG - trend troponins - no further episodes of afib. transfer to floor today - SQH - Consider anticoag dose heparin if afib returns  ESRD on HD MWF - Nephrology following, appreciate their help - HD Saturday - Continue calcitriol, ferric citrate,  cinacalcet  CHF (last echo 10/2016 with EF 60-65% and grade 2 DD) - continue home labetalol, isosorbide dinitrate, and aspirin - Continue HD, per nephrology  HTN Blood pressures under better control now after restarting home meds. - clonidine patch 8/17 - continue home amlodipine 10 qday, hydralazine 100 BID, labetalol 300 BID, isosorbide dintrate 5mg  BID - HD per nephrology  Probable OSA CPAP ordered but patient not using  Dispo: Anticipated discharge, pending work-up  Colbert Ewing, MD  Internal Medicine, PGY-1 02/15/2017, 7:23 AM P [phone]

## 2017-02-15 NOTE — Progress Notes (Signed)
Notified MD on call for teaching service of elevated troponin 0.25. No acute changes in cardiac rhythm, no c/o chest pain. No new orders given at this time. Will continue to monitor.

## 2017-02-15 NOTE — Progress Notes (Signed)
Internal Medicine Attending  Date: 02/15/2017  Patient name: Hayden [name] Medical record number: [id number] Date of birth: 09/21/1970 Age: 46 y.o. Gender: male  I saw and evaluated the patient. I reviewed the resident's note by Dr. Ronalee Red and I agree with the resident's findings and plans as documented in her progress note.  When seen on rounds this morning Hayden [name] was resting comfortably. His wife, who was at the bedside, agrees that his mental status has improved over the last 24 hours although she states he's not quite at baseline. We are continuing our current supportive therapy and expect his mental status to continue to improve to baseline as we address his infection, blood pressure, and psychotropic medications.

## 2017-02-15 NOTE — Progress Notes (Signed)
Patient arrived to unit per bed.  Reviewed treatment plan and this RN agrees.  Report received from bedside RN, Felipa Evener.  Consent verified.  Patient A & O X 3. Lung sounds diminished to ausculation in all fields. No edema. Cardiac: NSR, PVC.  Prepped LLAVF with alcohol and cannulated with two 15 gauge needles.  Pulsation of blood noted.  Flushed access well with saline per protocol.  Connected and secured lines and initiated tx at 1519.  UF goal of 4500 mL and net fluid removal of 4000 mL.  Will continue to monitor.

## 2017-02-16 DIAGNOSIS — K659 Peritonitis, unspecified: Secondary | ICD-10-CM

## 2017-02-16 LAB — GLUCOSE, CAPILLARY
GLUCOSE-CAPILLARY: 149 mg/dL — AB (ref 65–99)
GLUCOSE-CAPILLARY: 125 mg/dL — AB (ref 65–99)
Glucose-Capillary: 114 mg/dL — ABNORMAL HIGH (ref 65–99)
Glucose-Capillary: 114 mg/dL — ABNORMAL HIGH (ref 65–99)
Glucose-Capillary: 145 mg/dL — ABNORMAL HIGH (ref 65–99)
Glucose-Capillary: 98 mg/dL (ref 65–99)

## 2017-02-16 MED ORDER — DEXTROSE 5 % IV SOLN
1.0000 g | Freq: Every day | INTRAVENOUS | Status: DC
  Administered 2017-02-16: 1 g via INTRAVENOUS
  Filled 2017-02-16 (×2): qty 1

## 2017-02-16 MED ORDER — VANCOMYCIN HCL IN DEXTROSE 1-5 GM/200ML-% IV SOLN
1000.0000 mg | INTRAVENOUS | Status: DC
  Administered 2017-02-17: 1000 mg via INTRAVENOUS
  Filled 2017-02-16: qty 200

## 2017-02-16 NOTE — Progress Notes (Signed)
Glasses returned to patient. Were located in surgery.

## 2017-02-16 NOTE — Progress Notes (Signed)
There is no CPAP in room. PT also stated that he has never had a sleep study down.

## 2017-02-16 NOTE — Progress Notes (Signed)
BP 113/38 HR 79.  Spoke with Dr. Ronalee Red. Will hold morning dose of hydralazine per MD order.

## 2017-02-16 NOTE — Progress Notes (Signed)
Internal Medicine Attending  Date: 02/16/2017  Patient name: Hayden [name] Medical record number: [id number] Date of birth: [birth year] Age: 46 y.o. Gender: male  I saw and evaluated the patient. I reviewed the resident's note by Dr. Ronalee Red and I agree with the resident's findings and plans as documented in her progress note.  When seen on rounds this morning [name] was back to normal. There were no signs of easy to distractibility and inattention and we were able to carry on a normal conversation. He answered questions appropriately and had good insight. The plan is to complete a 5 day course of antibiotics after source control which means tomorrow will be his last dose. He will also be receiving his usual hemodialysis in the morning. I anticipate he can be discharged home thereafter.

## 2017-02-16 NOTE — Progress Notes (Signed)
Subjective:  HD yesterday. Patient doesn't like breathing treatments and declines further breathing treatments/CPAP. Advanced diet yesterday. Patient reports he is tolerating them well, but requests to go to the cafeteria for food.  QTc yesterday 521 msec, have not restarted lexapro  No acute events overnight. Patient is doing well. Responsive to all questions, not sleepy. Will finish 5 days of vanc/cefepime (last dose 8/19). Patient is eager to go home.  Objective:  Vital signs in last 24 hours: Vitals:   02/15/17 2202 02/16/17 0238 02/16/17 0421 02/16/17 0913  BP: (!) 126/53  (!) 128/49 (!) 113/38  Pulse: 87  82 79  Resp: 20  19 18   Temp: 98 F (36.7 C)  97.9 F (36.6 C) (!) 97.5 F (36.4 C)  TempSrc:   Oral Oral  SpO2: 100%  100% 98%  Weight: 190 lb 7.6 oz (86.4 kg) 190 lb 7.6 oz (86.4 kg)    Height:       General: white male sitting up in bed eating food in NAD; alert; responds to questions appropriately Lungs: CTAB, no increased work of breathing; on RA Heart: NR & RR; systolic murmur heard best at RUSB, no LLE edema  Labs CBC Latest Ref Rng & Units 02/14/2017 02/13/2017 02/12/2017  WBC 4.0 - 10.5 K/uL 14.3(H) 11.4(H) 10.1  Hemoglobin 13.0 - 17.0 g/dL 10.2(L) 8.7(L) 9.2(L)  Hematocrit 39.0 - 52.0 % 32.7(L) 28.5(L) 29.7(L)  Platelets 150 - 400 K/uL 743(H) 664(H) 652(H)   CMP Latest Ref Rng & Units 02/15/2017 02/14/2017 02/13/2017  Glucose 65 - 99 mg/dL 104(H) 94 93  BUN 6 - 20 mg/dL 44(H) 30(H) 61(H)  Creatinine 0.61 - 1.24 mg/dL 6.84(H) 5.16(H) 7.69(H)  Sodium 135 - 145 mmol/L 138 136 138  Potassium 3.5 - 5.1 mmol/L 3.9 3.9 4.5  Chloride 101 - 111 mmol/L 99(L) 97(L) 101  CO2 22 - 32 mmol/L 25 25 21(L)  Calcium 8.9 - 10.3 mg/dL 9.2 8.6(L) 8.5(L)  Total Protein 6.5 - 8.1 g/dL - 6.3(L) -  Total Bilirubin 0.3 - 1.2 mg/dL - 1.4(H) -  Alkaline Phos 38 - 126 U/L - 293(H) -  AST 15 - 41 U/L - 18 -  ALT 17 - 63 U/L - 16(L) -   Trop 0.07 -> 0.25 -> 0.23 ->  0.20  Assessment/Plan:  Peritonitis 2/2 peritoneal catheter that was not cared for, removed 8/14, resolving Patient denies abdominal pain this morning and has been tolerating renal diet. Requests to go to the cafeteria for food that he likes. Remains afebrile with normal vital signs. PD cath removed 8/14; no cultures drawn. Patient is clinically doing much better and is stable for anticipated discharge home tomorrow after HD and last dose of antibiotics - Continue empiric vancomycin/cefepime until 8/19 - Advance diet as tolerated - discharge tomorrow, pending clinical stability overnight  Confusion, resolved, likely multifactorial secondary to peritonitis, delirium, and residual sedating medications Have not been able to restart home lexapro due to prolonged QTc (521). Wife reports that he is basically at baseline. Transferred to floor yesterday. - Delirium precautions - HD tomorrow - PD catheter removed 8/14 - Continue empiric vanc/cefepime until 8/19 - Oral thiamine daily - Restart home medications - depakote, xanax PRN, and gabapentin PRN - Hold home lexapro due to prolonged QTc (521)  New onset afib, now in NSR, likely 2/2 to acute HTNive episode from clonidine withdrawal Returned to NSR after IV metoprolol 5. BP now under better control after resuming home meds. CT-A negative for acute PE. TSH  normal. Echo with LVEF 60-65%, grade 3 DD and slightly worsened AS. Troponin peaked at 0.25, now downtrending. - no further episodes of afib. transferred to floor yesterday - [facility]  ESRD on HD MWF - Nephrology following, appreciate their help - HD tomorrow - Continue calcitriol, ferric citrate, cinacalcet - discharge tomorrow on home HD schedule  CHF (last echo 10/2016 with EF 60-65% and grade 2 DD) - continue home labetalol, isosorbide dinitrate, and aspirin - Continue HD, per nephrology  HTN Systolic blood pressures 189Q-421I. Will discontinue hydralazine but keep his other  medications. - clonidine patch 8/17 - continue home amlodipine 10 qday, labetalol 300 BID, isosorbide dintrate 5mg  BID - discontinue hydralazine - HD per nephrology  Probable OSA CPAP ordered but patient not using - will request outpatient sleep study  Dispo: Anticipated discharge tomorrow  Colbert Ewing, MD  Internal Medicine, PGY-1 02/16/2017, 11:31 AM P [phone]

## 2017-02-16 NOTE — Progress Notes (Signed)
S:  "i want to go home" O:BP (!) 128/49 (BP Location: Left Leg)   Pulse 82   Temp 97.9 F (36.6 C) (Oral)   Resp 19   Ht '5\' 9"'$  (1.753 m)   Wt 86.4 kg (190 lb 7.6 oz)   SpO2 100%   BMI 28.13 kg/m   Intake/Output Summary (Last 24 hours) at 02/16/17 0859 Last data filed at 02/16/17 0643  Gross per 24 hour  Intake              240 ml  Output             3500 ml  Net            -3260 ml   Weight change: 1.3 kg (2 lb 13.9 oz) Gen: Awake and alert CVS: RRR 3/6 systolic M Resp: Sl decreased BS bases Abd: +BS soft, ND  Ext: R AKA  No edema LLE   Lt AVF + bruit NEURO: CNI, Ox3, no asterixis   . amLODipine  10 mg Oral Daily  . aspirin  81 mg Oral Daily  . chlorhexidine gluconate (MEDLINE KIT)  15 mL Mouth Rinse BID  . Chlorhexidine Gluconate Cloth  6 each Topical Daily  . cinacalcet  60 mg Oral Q breakfast  . cloNIDine  0.2 mg Transdermal Q Fri  . darbepoetin (ARANESP) injection - DIALYSIS  100 mcg Intravenous Q Mon-HD  . divalproex  500 mg Oral Q12H  . doxercalciferol  1 mcg Intravenous Q M,W,F-HD  . ferric citrate  420 mg Oral TID WC  . heparin subcutaneous  5,000 Units Subcutaneous Q8H  . hydrALAZINE  100 mg Oral BID  . isosorbide dinitrate  5 mg Oral BID  . labetalol  300 mg Oral BID  . mouth rinse  15 mL Mouth Rinse QID  . multivitamin  1 tablet Oral QHS  . nicotine  14 mg Transdermal Daily  . pantoprazole  40 mg Oral Daily  . pravastatin  20 mg Oral Daily  . sodium chloride flush  3 mL Intravenous Q12H  . thiamine  100 mg Oral Daily   Ct Angio Chest Pe W Or Wo Contrast  Result Date: 02/14/2017 CLINICAL DATA:  Inpatient. PE suspected. High pretest probability. Respiratory acidosis. EXAM: CT ANGIOGRAPHY CHEST WITH CONTRAST TECHNIQUE: Multidetector CT imaging of the chest was performed using the standard protocol during bolus administration of intravenous contrast. Multiplanar CT image reconstructions and MIPs were obtained to evaluate the vascular anatomy. CONTRAST:  80  cc Isovue 370 IV . COMPARISON:  Chest radiograph from one day prior. FINDINGS: Cardiovascular: The study is low to moderate quality for the evaluation of pulmonary embolism, with evaluation limited by suboptimal contrast opacification and by motion artifact affecting the segmental and subsegmental branches. There are no filling defects in the central, lobar, segmental or subsegmental pulmonary artery branches to suggest acute pulmonary embolism. Atherosclerotic nonaneurysmal thoracic aorta. Top-normal caliber main pulmonary artery (3.1 cm diameter). Mild cardiomegaly. No significant pericardial fluid/thickening. Left main, left anterior descending, left circumflex and right coronary atherosclerosis. Mediastinum/Nodes: Stable size of calcified 1.2 cm inferior left thyroid lobe nodule. New mild right paratracheal adenopathy measuring up to 1.5 cm. New mild bilateral hilar adenopathy measuring up to 1.1 cm on the right (series 6/ image 64) and 1.3 cm on the left (series 6/ image 69). No axillary adenopathy. Lungs/Pleura: No pneumothorax. Trace dependent right pleural effusion. No left pleural effusion. Diffuse interlobular septal thickening and prominent diffuse patchy ground-glass attenuation throughout both lungs.  Mild bibasilar atelectasis. No discrete lung masses or significant pulmonary nodules in the limited aerated portions of the lungs. Upper abdomen: Small volume upper abdominal ascites. Musculoskeletal: No aggressive appearing focal osseous lesions. Mild thoracic spondylosis. Review of the MIP images confirms the above findings. IMPRESSION: 1. Limited scan.  No evidence of acute pulmonary embolism. 2. Spectrum of findings compatible with congestive heart failure, including cardiomegaly, trace dependent right pleural effusion and diffuse interlobular septal thickening and patchy ground-glass attenuation compatible with pulmonary edema. 3. New mild mediastinal and bilateral hilar adenopathy, nonspecific,  probably reactive. 4. Small volume upper abdominal ascites. 5. Left main and 3 vessel coronary atherosclerosis. Aortic Atherosclerosis (ICD10-I70.0). Electronically Signed   By: Ilona Sorrel M.D.   On: 02/14/2017 14:02   BMET    Component Value Date/Time   NA 138 02/15/2017 0425   K 3.9 02/15/2017 0425   CL 99 (L) 02/15/2017 0425   CO2 25 02/15/2017 0425   GLUCOSE 104 (H) 02/15/2017 0425   BUN 44 (H) 02/15/2017 0425   CREATININE 6.84 (H) 02/15/2017 0425   CALCIUM 9.2 02/15/2017 0425   GFRNONAA 9 (L) 02/15/2017 0425   GFRAA 10 (L) 02/15/2017 0425   CBC    Component Value Date/Time   WBC 14.3 (H) 02/14/2017 0228   RBC 3.80 (L) 02/14/2017 0228   HGB 10.2 (L) 02/14/2017 0228   HCT 32.7 (L) 02/14/2017 0228   PLT 743 (H) 02/14/2017 0228   MCV 86.1 02/14/2017 0228   MCH 26.8 02/14/2017 0228   MCHC 31.2 02/14/2017 0228   RDW 17.0 (H) 02/14/2017 0228   LYMPHSABS 0.6 (L) 02/14/2017 0228   MONOABS 1.2 (H) 02/14/2017 0228   EOSABS 0.2 02/14/2017 0228   BASOSABS 0.1 02/14/2017 0228     Assessment:  1. Abdominal pain, ? Peritonitis.  Vanco/cefipime  PD cath out  [facility] neg  2. ESRD  MWF [place] 3. HTN    4. Anemia on aranesp 5. DM 6. Sec HPTH sensipar/hectorol 7. Delirium, resolved  Plan: 1.  HD tomorrow   ? Change cefipime to PO cipro  Quame Spratlin T

## 2017-02-16 NOTE — Progress Notes (Signed)
Pharmacy Antibiotic Note Hayden [name] is a 46 y.o. male admitted on 02/07/2017 with concern for peritonitis. He is currently on day 9 of cefepime and vancomycin treatment.   Plan: 1. Cefepime 1 gram IV Q 24 hours 2. Vancomycin 1 gram IV after dialysis on Monday, Wednesday and Friday  3. Follow up LOT, cultures, clinical status   Height: 5\' 9"  (175.3 cm) Weight: 190 lb 7.6 oz (86.4 kg) IBW/kg (Calculated) : 70.7  Temp (24hrs), Avg:98 F (36.7 C), Min:97.5 F (36.4 C), Max:98.4 F (36.9 C)   Recent Labs Lab 02/10/17 0825 02/11/17 0614 02/12/17 0249 02/13/17 0216 02/13/17 1447 02/14/17 0228 02/15/17 0425  WBC 11.4* 10.8* 10.1 11.4*  --  14.3*  --   CREATININE 8.40* 5.80* 6.37* 7.69*  --  5.16* 6.84*  VANCOTROUGH  --   --   --   --  16  --   --     Estimated Creatinine Clearance: 15 mL/min (A) (by C-G formula based on SCr of 6.84 mg/dL (H)).    Antibiotics on this admission: Vancomycin 8/10>> Cefepime 8/10>>  Cultures: 8/13 Surgical PCR screen>>positive for staph aureus 8/10 BC x 2 >> Negative 8/10 MRSA PCR >>Negative    Vincenza Hews, PharmD, BCPS 02/16/2017, 10:38 AM

## 2017-02-17 LAB — RENAL FUNCTION PANEL
Albumin: 2.3 g/dL — ABNORMAL LOW (ref 3.5–5.0)
Anion gap: 11 (ref 5–15)
BUN: 39 mg/dL — ABNORMAL HIGH (ref 6–20)
CALCIUM: 8.5 mg/dL — AB (ref 8.9–10.3)
CHLORIDE: 101 mmol/L (ref 101–111)
CO2: 27 mmol/L (ref 22–32)
CREATININE: 6.16 mg/dL — AB (ref 0.61–1.24)
GFR calc Af Amer: 12 mL/min — ABNORMAL LOW (ref >60)
GFR calc non Af Amer: 10 mL/min — ABNORMAL LOW (ref >60)
GLUCOSE: 98 mg/dL (ref 65–99)
Phosphorus: 5.5 mg/dL — ABNORMAL HIGH (ref 2.5–4.6)
Potassium: 4 mmol/L (ref 3.5–5.1)
SODIUM: 139 mmol/L (ref 135–145)

## 2017-02-17 LAB — CBC
HCT: 30.7 % — ABNORMAL LOW (ref 39.0–52.0)
Hemoglobin: 9.2 g/dL — ABNORMAL LOW (ref 13.0–17.0)
MCH: 26.5 pg (ref 26.0–34.0)
MCHC: 30 g/dL (ref 30.0–36.0)
MCV: 88.5 fL (ref 78.0–100.0)
PLATELETS: 692 10*3/uL — AB (ref 150–400)
RBC: 3.47 MIL/uL — ABNORMAL LOW (ref 4.22–5.81)
RDW: 18.2 % — ABNORMAL HIGH (ref 11.5–15.5)
WBC: 11 10*3/uL — ABNORMAL HIGH (ref 4.0–10.5)

## 2017-02-17 LAB — GLUCOSE, CAPILLARY
GLUCOSE-CAPILLARY: 107 mg/dL — AB (ref 65–99)
GLUCOSE-CAPILLARY: 168 mg/dL — AB (ref 65–99)
Glucose-Capillary: 110 mg/dL — ABNORMAL HIGH (ref 65–99)
Glucose-Capillary: 88 mg/dL (ref 65–99)

## 2017-02-17 MED ORDER — HYDRALAZINE HCL 50 MG PO TABS
100.0000 mg | ORAL_TABLET | Freq: Two times a day (BID) | ORAL | Status: DC
  Administered 2017-02-17: 100 mg via ORAL
  Filled 2017-02-17 (×4): qty 2

## 2017-02-17 MED ORDER — DOXERCALCIFEROL 4 MCG/2ML IV SOLN
INTRAVENOUS | Status: AC
  Administered 2017-02-17: 1 ug via INTRAVENOUS
  Filled 2017-02-17: qty 2

## 2017-02-17 MED ORDER — DARBEPOETIN ALFA 100 MCG/0.5ML IJ SOSY
PREFILLED_SYRINGE | INTRAMUSCULAR | Status: AC
  Administered 2017-02-17: 100 ug via INTRAVENOUS
  Filled 2017-02-17: qty 0.5

## 2017-02-17 MED ORDER — VANCOMYCIN HCL IN DEXTROSE 1-5 GM/200ML-% IV SOLN
INTRAVENOUS | Status: AC
Start: 2017-02-17 — End: 2017-02-17
  Administered 2017-02-17: 1000 mg via INTRAVENOUS
  Filled 2017-02-17: qty 200

## 2017-02-17 MED ORDER — DEXTROSE 5 % IV SOLN
1.0000 g | INTRAVENOUS | Status: DC
  Administered 2017-02-17: 1 g via INTRAVENOUS
  Filled 2017-02-17: qty 1

## 2017-02-17 NOTE — Procedures (Signed)
Pt alert and OX 3, we went over all his home BP meds and resumed his hydralazine.  For dc after HD today.   I was present at this dialysis session, have reviewed the session itself and made  appropriate changes Kelly Splinter MD [place] pager [phone]   02/17/2017, 12:27 PM

## 2017-02-17 NOTE — Progress Notes (Signed)
OT Cancellation    02/17/17 0800  OT Visit Information  Last OT Received On 02/17/17  Reason Eval/Treat Not Completed Patient at procedure or test/ unavailable (HD. Will follow up as schedule allows.)   Loretha Brasil MSOT, OTR/L Acute Rehab Pager: [phone] Office: [phone]

## 2017-02-17 NOTE — Progress Notes (Signed)
PT Cancellation Note  Patient Details Name: Hayden [name] MRN: [id number] DOB: 25-Dec-1970   Cancelled Treatment:    Reason Eval/Treat Not Completed: Patient at procedure or test/unavailable   Currently in HD;  Will follow up later today as time allows;  Otherwise, will follow up for PT tomorrow;   Thank you,  Roney Marion, PT  Acute Rehabilitation Services Pager [phone] Office [phone]     Colletta [place] 02/17/2017, 8:38 AM

## 2017-02-17 NOTE — Progress Notes (Addendum)
Subjective:  No acute events overnight. Hayden [name] was seen in dialysis this morning. He is tolerating food well and denies abdominal pain. He is responding to all questions and not sleepy.  Last dose of vanc/cefepime today. Patient is amenable to discharge today.  Objective:  Vital signs in last 24 hours: Vitals:   02/17/17 0745 02/17/17 0749 02/17/17 0800 02/17/17 0830  BP: (!) 189/87 (!) 192/86 (!) 182/88 (!) 202/90  Pulse: 77 77 78 79  Resp: 18 17 16 17   Temp: 98.2 F (36.8 C) 98.2 F (36.8 C)    TempSrc: Oral Oral    SpO2: 98% 98%    Weight: 190 lb 0.6 oz (86.2 kg)     Height:       General: lying in HD bed in NAD; awake and alert; responds to questions appropriately Lungs: CTAB anteriorly, no wheezes; on room air Heart: NR & RR; systolic murmur heard best at RUSB Abdomen: soft, non-distended, normal bowel sounds. Slightly tender to palpation in RLQ around surgical site Ext: R AKA  Labs CBC Latest Ref Rng & Units 02/17/2017 02/14/2017 02/13/2017  WBC 4.0 - 10.5 K/uL 11.0(H) 14.3(H) 11.4(H)  Hemoglobin 13.0 - 17.0 g/dL 9.2(L) 10.2(L) 8.7(L)  Hematocrit 39.0 - 52.0 % 30.7(L) 32.7(L) 28.5(L)  Platelets 150 - 400 K/uL 692(H) 743(H) 664(H)   CMP Latest Ref Rng & Units 02/17/2017 02/15/2017 02/14/2017  Glucose 65 - 99 mg/dL 98 104(H) 94  BUN 6 - 20 mg/dL 39(H) 44(H) 30(H)  Creatinine 0.61 - 1.24 mg/dL 6.16(H) 6.84(H) 5.16(H)  Sodium 135 - 145 mmol/L 139 138 136  Potassium 3.5 - 5.1 mmol/L 4.0 3.9 3.9  Chloride 101 - 111 mmol/L 101 99(L) 97(L)  CO2 22 - 32 mmol/L 27 25 25   Calcium 8.9 - 10.3 mg/dL 8.5(L) 9.2 8.6(L)  Total Protein 6.5 - 8.1 g/dL - - 6.3(L)  Total Bilirubin 0.3 - 1.2 mg/dL - - 1.4(H)  Alkaline Phos 38 - 126 U/L - - 293(H)  AST 15 - 41 U/L - - 18  ALT 17 - 63 U/L - - 16(L)   Assessment/Plan:  Peritonitis 2/2 peritoneal catheter that was not cared for, removed 8/14, resolved Patient denies abdominal pain this morning and has been tolerating food. His  mental status is back to baseline. Remains afebrile with normal vital signs. PD cath removed 8/14; no cultures drawn. Patient is clinically doing much better and is stable for discharge today after HD and last dose of antibiotics - Last dose of empiric vancomycin/cefepime (5 days of antibiotics since source control) - discharge today after HD  Confusion, resolved, likely multifactorial secondary to peritonitis, delirium, and residual sedating medications Have not been able to restart home lexapro due to prolonged QTc (521). - Delirium precautions - HD today - PD catheter removed 8/14 - Last dose empiric vanc/cefepime (8/14-8/20) - Oral thiamine daily - Restart home medications - depakote, xanax PRN, and gabapentin PRN - Hold home lexapro due to prolonged QTc (521) - Check EKG and start lexapro if possible  New onset afib, now in NSR, likely 2/2 to acute HTNive episode from clonidine withdrawal Returned to NSR after IV metoprolol 5. BP now under better control after resuming home meds. CT-A negative for acute PE. TSH normal. Echo with LVEF 60-65%, grade 3 DD and slightly worsened AS. Troponin peaked at 0.25, now downtrending - likely related to demand. - no further episodes of afib. transferred to floor yesterday - [facility] - will follow up with PCP - EKG  ESRD on HD MWF - Nephrology following, appreciate their help - HD today - Continue calcitriol, ferric citrate, cinacalcet - discharge today on home HD schedule  CHF (last echo 10/2016 with EF 60-65% and grade 2 DD) - continue home labetalol, isosorbide dinitrate, and aspirin - Continue HD, per nephrology  HTN Elevated systolic blood pressures 929V - clonidine patch 8/17 - continue home amlodipine 10 qday, labetalol 300 BID, isosorbide dintrate 5mg  BID - restart home hydralazine - HD per nephrology  Probable OSA CPAP ordered but patient not using - outpatient sleep study  Dispo: Anticipated discharge today  Colbert Ewing, MD  Internal Medicine, PGY-1 02/17/2017, 9:36 AM P [phone]

## 2017-02-17 NOTE — Progress Notes (Signed)
Hayden [name] to be D/C'd Home per MD order.  Discussed prescriptions and follow up appointments with the patient. Prescriptions given to patient, medication list explained in detail. Pt verbalized understanding.  Allergies as of 02/17/2017      Reactions   Dilaudid [hydromorphone Hcl] Other (See Comments)   ABNORMAL BEHAVIORS "VERBALLY AND PHYSICALLY ABUSIVE" PSYCHOSIS   Morphine And Related Itching, Other (See Comments)   Adhesive [tape] Other (See Comments)   Redness from adhesive tape if left on too long, paper tape is preferred      Medication List    STOP taking these medications   escitalopram 10 MG tablet Commonly known as:  LEXAPRO   HYDROcodone-acetaminophen 10-325 MG tablet Commonly known as:  NORCO     TAKE these medications   ALPRAZolam 1 MG tablet Commonly known as:  XANAX Take 1 mg by mouth 3 (three) times daily.   amLODipine 10 MG tablet Commonly known as:  NORVASC Take 1 tablet (10 mg total) by mouth daily.   aspirin EC 81 MG tablet Take 81 mg by mouth daily. What changed:  Another medication with the same name was removed. Continue taking this medication, and follow the directions you see here.   calcitRIOL 0.5 MCG capsule Commonly known as:  ROCALTROL Take 1 capsule (0.5 mcg total) by mouth daily.   cinacalcet 60 MG tablet Commonly known as:  SENSIPAR Take 1 tablet (60 mg total) by mouth daily.   cloNIDine 0.2 mg/24hr patch Commonly known as:  CATAPRES - Dosed in mg/24 hr Place 1 patch (0.2 mg total) onto the skin once a week. What changed:  when to take this   divalproex 125 MG capsule Commonly known as:  DEPAKOTE SPRINKLE Take 4 capsules (500 mg total) by mouth every 12 (twelve) hours.   ferric citrate 1 GM 210 MG(Fe) tablet Commonly known as:  AURYXIA Take 2 tablets (420 mg total) by mouth 3 (three) times daily with meals.   furosemide 80 MG tablet Commonly known as:  LASIX Take 80 mg by mouth 2 (two) times daily.   gabapentin 100  MG capsule Commonly known as:  NEURONTIN Take 1 capsule (100 mg total) by mouth 3 (three) times daily.   hydrALAZINE 100 MG tablet Commonly known as:  APRESOLINE Take 1 tablet (100 mg total) by mouth every 8 (eight) hours. What changed:  when to take this   isosorbide dinitrate 5 MG tablet Commonly known as:  ISORDIL Take 1 tablet (5 mg total) by mouth 2 (two) times daily. What changed:  how much to take   labetalol 300 MG tablet Commonly known as:  NORMODYNE Take 1 tablet (300 mg total) by mouth 2 (two) times daily.   LORazepam 1 MG tablet Commonly known as:  ATIVAN Take 1 tablet (1 mg total) by mouth every 6 (six) hours as needed for anxiety or sleep.   methocarbamol 500 MG tablet Commonly known as:  ROBAXIN Take 1 tablet (500 mg total) by mouth every 6 (six) hours as needed for muscle spasms. What changed:  when to take this   multivitamin Tabs tablet Take 1 tablet by mouth at bedtime.   mupirocin ointment 2 % Commonly known as:  BACTROBAN Apply 1 application topically See admin instructions. Apply to wound on foot three times daily   omeprazole 40 MG capsule Commonly known as:  PRILOSEC Take 1 capsule (40 mg total) by mouth daily.   pravastatin 20 MG tablet Commonly known as:  PRAVACHOL Take 1 tablet (  20 mg total) by mouth daily.       Vitals:   02/17/17 1300 02/17/17 1431  BP: (!) 147/90 (!) 215/92  Pulse:    Resp:    Temp:    SpO2:      Skin clean, dry and intact without evidence of skin break down, no evidence of skin tears noted. IV catheter discontinued intact. Site without signs and symptoms of complications. Dressing and pressure applied. Pt denies pain at this time. No complaints noted.  An After Visit Summary was printed and given to the patient. Patient escorted via [place], and D/C home via private auto.  Emilio Math, RN [facility] Phone [phone]

## 2017-02-17 NOTE — Progress Notes (Signed)
Internal Medicine Attending  Date: 02/17/2017  Patient name: Hayden [name] Medical record number: [id number] Date of birth: 06-30-1971 Age: 46 y.o. Gender: male  I saw and evaluated the patient. I reviewed the resident's note by Dr. Ronalee Red and I agree with the resident's findings and plans as documented in her progress note.  When seen on rounds today Hayden [name] was in hemodialysis. He was on his smart phone using [employer] to send text messages while receiving hemodialysis. On examination he was alert and oriented and responded appropriately without any signs of delirium. He will receive his final dose of antibiotics today and will be discharged home soon after hemodialysis.

## 2017-02-17 NOTE — Discharge Instructions (Signed)
Hayden [name],  Please follow up with your primary care doctor in 2 weeks. Please continue to go to your dialysis appointments on Monday, Wednesday, Friday.

## 2017-03-07 ENCOUNTER — Ambulatory Visit (INDEPENDENT_AMBULATORY_CARE_PROVIDER_SITE_OTHER): Payer: Medicare Other | Admitting: Family

## 2017-03-14 ENCOUNTER — Telehealth: Payer: Self-pay | Admitting: *Deleted

## 2017-03-14 NOTE — Telephone Encounter (Signed)
NOTES SENT TO SCHEDULING.  °

## 2017-03-31 ENCOUNTER — Ambulatory Visit (INDEPENDENT_AMBULATORY_CARE_PROVIDER_SITE_OTHER): Payer: Medicare Other | Admitting: Family

## 2017-03-31 ENCOUNTER — Encounter (INDEPENDENT_AMBULATORY_CARE_PROVIDER_SITE_OTHER): Payer: Self-pay | Admitting: Family

## 2017-03-31 DIAGNOSIS — I70261 Atherosclerosis of native arteries of extremities with gangrene, right leg: Secondary | ICD-10-CM

## 2017-03-31 DIAGNOSIS — M25562 Pain in left knee: Secondary | ICD-10-CM

## 2017-03-31 MED ORDER — GABAPENTIN 100 MG PO CAPS: 100.0000 mg | ORAL_CAPSULE | Freq: Three times a day (TID) | ORAL | 3 refills | Status: DC

## 2017-03-31 MED ORDER — HYDROCODONE-ACETAMINOPHEN 5-325 MG PO TABS: 1.0000 | ORAL_TABLET | Freq: Three times a day (TID) | ORAL | 0 refills | Status: DC | PRN

## 2017-03-31 NOTE — Progress Notes (Signed)
Office Visit Note   Patient: Hayden [name]           Date of Birth: 06-19-1971           MRN: [id number] Visit Date: 03/31/2017              Requested by: [name], DO [place], [place] [address] PCP: [name], DO  Chief Complaint  Patient presents with  . Left Knee - Pain      HPI: The patient is a 46 year old gentleman who is seen today for initial evaluation of left knee pain. 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 He states he was walking and pivoted felt and heard a pop in his knee. He is unable to fully extend his knee having intense lateral pain and tenderness. States is on it been unable to weight-bear since did go to an urgent care since his initial radiographs were negative for fracture. Did show calcification of his vessels.  Assessment & Plan: Visit Diagnoses:  1. Acute pain of left knee     Plan: Will proceed with MRI left knee. Have provided a short course of vicodin. Will use ice for swelling and pain. Continue in wheelchair for ambulation.  Follow-Up Instructions: Return for mri review.   Left Knee Exam   Tenderness  The patient is experiencing tenderness in the lateral joint line and LCL.  Tests  Drawer:       Anterior - negative     Posterior - negative  Other  Erythema: absent Swelling: moderate  Comments:  Unable to fully extend knee for exam. Unable to perform varus or valgus stress  Can flex knee  Quad tendon mild tenderness, no palpable defect      Patient is alert, oriented, no adenopathy, well-dressed, normal affect, normal respiratory effort.   Imaging: No results found. No images are attached to the encounter.  Labs: Lab Results  Component Value Date   HGBA1C 5.4 06/20/2015   HGBA1C 9.9 (H) 09/22/2012   HGBA1C (H) 12/22/2008    11.5 (NOTE) The ADA recommends the following therapeutic goal for glycemic control related to Hgb A1c measurement: Goal of therapy: <6.5 Hgb A1c  Reference: American Diabetes  Association: Clinical Practice Recommendations 2010, Diabetes Care, 2010, 33: (Suppl  1).   REPTSTATUS 02/12/2017 FINAL 02/07/2017   GRAMSTAIN NO WBC SEEN NO ORGANISMS SEEN  11/09/2016   CULT NO GROWTH 5 DAYS 02/07/2017    Orders:  Orders Placed This Encounter  Procedures  . MR Knee Left w/o contrast   Meds ordered this encounter  Medications  . DISCONTD: gabapentin (NEURONTIN) 100 MG capsule    Sig: Take 1 capsule (100 mg total) by mouth 3 (three) times daily.    Dispense:  90 capsule    Refill:  3  . gabapentin (NEURONTIN) 100 MG capsule    Sig: Take 1 capsule (100 mg total) by mouth 3 (three) times daily.    Dispense:  90 capsule    Refill:  3  . HYDROcodone-acetaminophen (NORCO/VICODIN) 5-325 MG tablet    Sig: Take 1 tablet by mouth 3 (three) times daily as needed for moderate pain.    Dispense:  21 tablet    Refill:  0     Procedures: No procedures performed  Clinical Data: No additional findings.  ROS:  All other systems negative, except as noted in the HPI. Review of Systems  Constitutional: Negative for chills and fever.  Musculoskeletal: Positive for arthralgias, joint swelling and myalgias.    Objective: Vital Signs: There were no vitals taken for this visit.  Specialty Comments:  No specialty comments available.  PMFS History: Patient Active Problem List   Diagnosis Date Noted  . ESRD on hemodialysis ([place])   . SOB (shortness of breath)   . Hypoxia   . Respiratory difficulty   . Metabolic encephalopathy   . Sepsis ([place])   . Depression   . Abdominal pain 02/07/2017  . Prediabetes   . Phantom limb pain ([place])   . Hypertensive urgency   . Type 2 diabetes mellitus with peripheral neuropathy (HCC)   . Chronic diastolic heart failure ([place])   . Controlled type 2 diabetes mellitus with chronic kidney disease on chronic dialysis, without long-term current use of insulin ([place])   . ESRD on dialysis ([place])   . 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 Neuropathic pain   . Hypertensive crisis   . Labile blood pressure   . Type 2 diabetes mellitus with diabetic peripheral angiopathy and gangrene, without long-term current use of insulin ([place])   . Debility 11/22/2016  . Anemia of chronic disease   . Diabetes mellitus type 2 in nonobese (HCC)   . Confusion   . S/P femoral-popliteal bypass surgery   . Tobacco abuse   . Benign essential HTN   . History of CVA (cerebrovascular accident)   . Post-operative pain   . Acute respiratory failure with hypoxia ([place])   . Agitation 11/04/2016  . Acute encephalopathy 11/04/2016  . Hypokalemia 11/02/2016  . QT prolongation   . Gangrene of right foot ([place])   . Pressure injury of skin 10/24/2016  . Leukocytosis 10/23/2016  . Hyperkalemia 10/23/2016  . Abdominal hematoma 11/15/2015  . HCAP (healthcare-associated pneumonia) 11/15/2015  . Acute blood loss anemia 11/15/2015  . Absolute anemia   . Right lower quadrant pain   . Pneumonia involving right lung 11/04/2015  . HTN (hypertension) 11/04/2015  . Noninfectious gastroenteritis and colitis 11/04/2015  . Gastroenteritis 11/04/2015  . CVA (cerebral infarction) 06/19/2015  . End stage renal disease on dialysis ([place]) 06/19/2015  . Lacunar infarct, acute 06/19/2015  . GERD (gastroesophageal reflux disease) 06/19/2015  . Anxiety and depression 06/19/2015  . Hypertension, well controlled 06/19/2015  . End stage renal disease ([place]) 01/21/2013  . Acute coronary syndrome ([place]) 09/22/2012  . History of partial Right nephrectomy for renal mass (2008) 09/22/2012  . Left ventricular hypertrophy (moderate) per ECHO 2010 09/22/2012  . Chronic systolic congestive heart failure/EF 40% PER echo 2010 09/22/2012  . 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 Acute kidney injury ([place]) 09/21/2012  . Chest pain 09/21/2012   Past Medical History:  Diagnosis Date  . Anemia March 2014  . Cancer ([place])    Kidney  . CHF  (congestive heart failure) ([place]) 7412   Acute systolic and diastolic CHF  . Depression    & rage --  was in counseling....great now  . Diabetes mellitus    NO DM SINCE LOST 130LBS  . ESRD on peritoneal dialysis ([place]) 2018   Started in-center HD approx 2015 for 2 years, then did about 1 year of home HD and then started peritoneal dialysis in early 2018.    . Gangrene ([place])    right leg and foot  . History of kidney cancer   . Hyperlipidemia   . Hypertension   . Obesity   . PONV (postoperative nausea and vomiting)   .  Stroke [facility])     Family History  Problem Relation Age of Onset  . Heart disease Mother        Heart Disease before age 83  . Deep vein thrombosis Father   . Heart attack Father   . Other Other     Past Surgical History:  Procedure Laterality Date  . ABDOMINAL AORTOGRAM W/LOWER EXTREMITY N/A 10/28/2016   Procedure: Abdominal Aortogram w/Lower Extremity;  Surgeon: Angelia Mould, MD;  Location: [facility];  Service: Cardiovascular;  Laterality: N/A;  . AMPUTATION Right 11/05/2016   Procedure: AMPUTATION RIGHT FIRST RAY;  Surgeon: Conrad [place], MD;  Location: [facility];  Service: Vascular;  Laterality: Right;  . AMPUTATION Right 12/04/2016   Procedure: RIGHT ABOVE KNEE AMPUTATION;  Surgeon: Newt Minion, MD;  Location: [facility];  Service: Orthopedics;  Laterality: Right;  . AV FISTULA PLACEMENT Left 01/26/2013   Procedure: ARTERIOVENOUS (AV) FISTULA CREATION - LEFT RADIAL CEPHALIC AVF;  Surgeon: Angelia Mould, MD;  Location: [facility];  Service: Vascular;  Laterality: Left;  . CAPD REMOVAL N/A 02/11/2017   Procedure: CONTINUOUS AMBULATORY PERITONEAL DIALYSIS  (CAPD) CATHETER REMOVAL;  Surgeon: Donnie Mesa, MD;  Location: [facility];  Service: General;  Laterality: N/A;  . CHOLECYSTECTOMY  11/06/2015   Procedure: LAPAROSCOPIC CHOLECYSTECTOMY;  Surgeon: Ralene Ok, MD;  Location: [facility];  Service: General;;  . FEMORAL-POPLITEAL BYPASS GRAFT Right 11/05/2016    Procedure: BYPASS GRAFT FEMORAL-POPLITEAL ARTERY USING NON-REVERSED RIGHT GREATER SAPPHENOUS VEIN;  Surgeon: Conrad Union, MD;  Location: [facility];  Service: Vascular;  Laterality: Right;  . HERNIA REPAIR    . LOWER EXTREMITY ANGIOGRAM Right 10/30/2016   Procedure: Right  LOWER EXTREMITY ANGIOGRAM WITH RIGHT SUPERFICIAL FEMORAL ARTERY balloon angioplasty;  Surgeon: Conrad Quincy, MD;  Location: [facility];  Service: Vascular;  Laterality: Right;  . NEPHRECTOMY Right 2008   partial  . TESTICLE TORSION REDUCTION    . TONSILLECTOMY AND ADENOIDECTOMY    . VEIN HARVEST Right 11/05/2016   Procedure: RIGHT GREATER SAPPHENOUS VEIN HARVEST;  Surgeon: Conrad Monmouth, MD;  Location: [facility];  Service: Vascular;  Laterality: Right;   Social History   Occupational History  . [employer]    Social History Main Topics  . Smoking status: Current Some Day Smoker    Packs/day: 0.25    Years: 10.00    Types: Cigarettes  . Smokeless tobacco: Never Used  . Alcohol use No     Comment: "6 pack occ."-- lasts him six months-STOPPED 2 YRS AGO  . Drug use: Yes    Types: Marijuana     Comment: just in high school   . Sexual activity: Yes    Birth control/ protection: None

## 2017-04-03 ENCOUNTER — Encounter: Payer: Self-pay | Admitting: Vascular Surgery

## 2017-04-03 ENCOUNTER — Telehealth: Payer: Self-pay | Admitting: Cardiology

## 2017-04-03 NOTE — Telephone Encounter (Signed)
Received records from [facility] for appointment on 04/30/17 with Dr Ellyn Hack.  Records put with Dr Allison Quarry schedule for 04/30/17. lp

## 2017-04-04 ENCOUNTER — Encounter: Payer: Self-pay | Admitting: Vascular Surgery

## 2017-04-04 ENCOUNTER — Other Ambulatory Visit: Payer: Self-pay | Admitting: *Deleted

## 2017-04-04 ENCOUNTER — Ambulatory Visit (INDEPENDENT_AMBULATORY_CARE_PROVIDER_SITE_OTHER): Payer: Medicare Other | Admitting: Vascular Surgery

## 2017-04-04 ENCOUNTER — Encounter: Payer: Self-pay | Admitting: *Deleted

## 2017-04-04 VITALS — BP 186/92 | HR 85 | Resp 16

## 2017-04-04 DIAGNOSIS — I7025 Atherosclerosis of native arteries of other extremities with ulceration: Secondary | ICD-10-CM

## 2017-04-04 NOTE — Progress Notes (Signed)
Established Previous Bypass   History of Present Illness   Hayden [name] is a 46 y.o. (1970/12/04) male who presents with chief complaint: left foot wound.  Previous operation(s) include: s/p R CFA to BK pop BPG w/ NR ips GSV, R 1st ray amp (11/05/16).  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  The patient is in the process of fitting for his R AKA prosthesis.  His left foot now has a hole on the dorsum reportedly since June 2018.  The patient did not follow since his operation in May 2018.  Reportedly, no drainage from wound and no pain.  The patient denies any fever or chills.  He is current wheelchair bound.  The patient's PMH, PSH, SH, and FamHx are unchanged from 11/26/16.  Current Outpatient Prescriptions  Medication Sig Dispense Refill  . ALPRAZolam (XANAX) 1 MG tablet Take 1 mg by mouth 3 (three) times daily.    Marland Kitchen amLODipine (NORVASC) 10 MG tablet Take 1 tablet (10 mg total) by mouth daily. 30 tablet 0  . aspirin EC 81 MG tablet Take 81 mg by mouth daily.    . cinacalcet (SENSIPAR) 60 MG tablet Take 1 tablet (60 mg total) by mouth daily. 30 tablet 0  . cloNIDine (CATAPRES - DOSED IN MG/24 HR) 0.2 mg/24hr patch Place 1 patch (0.2 mg total) onto the skin once a week. (Patient taking differently: Place 0.2 mg onto the skin every Friday. ) 4 patch 12  . divalproex (DEPAKOTE SPRINKLE) 125 MG capsule Take 4 capsules (500 mg total) by mouth every 12 (twelve) hours. 60 capsule 0  . ferric citrate (AURYXIA) 1 GM 210 MG(Fe) tablet Take 2 tablets (420 mg total) by mouth 3 (three) times daily with meals. 270 tablet 0  . furosemide (LASIX) 80 MG tablet Take 80 mg by mouth 2 (two) times daily.    Marland Kitchen gabapentin (NEURONTIN) 100 MG capsule Take 1 capsule (100 mg total) by mouth 3 (three) times daily. 90 capsule 3  . hydrALAZINE (APRESOLINE) 100 MG tablet Take 1 tablet (100 mg total) by mouth every 8 (eight) hours. (Patient taking differently: Take 100 mg by mouth 2  (two) times daily. ) 90 tablet 0  . HYDROcodone-acetaminophen (NORCO/VICODIN) 5-325 MG tablet Take 1 tablet by mouth 3 (three) times daily as needed for moderate pain. 21 tablet 0  . isosorbide dinitrate (ISORDIL) 5 MG tablet Take 1 tablet (5 mg total) by mouth 2 (two) times daily. (Patient taking differently: Take 2.5 mg by mouth 2 (two) times daily. ) 60 tablet 0  . labetalol (NORMODYNE) 300 MG tablet Take 1 tablet (300 mg total) by mouth 2 (two) times daily. 60 tablet 0  . multivitamin (RENA-VIT) TABS tablet Take 1 tablet by mouth at bedtime. 30 tablet 0  . mupirocin ointment (BACTROBAN) 2 % Apply 1 application topically See admin instructions. Apply to wound on foot three times daily    . omeprazole (PRILOSEC) 40 MG capsule Take 1 capsule (40 mg total) by mouth daily. 30 capsule 0  . pravastatin (PRAVACHOL) 20 MG tablet Take 1 tablet (20 mg total) by mouth daily. 30 tablet 0   No current facility-administered medications for this visit.     On ROS today: no rest pain, no drainage from L foot   Physical Examination   Vitals:   04/04/17 1056  BP: (!) 186/92  Pulse: 85  Resp: 16  SpO2: 91%   There is no height or  weight on file to calculate BMI.  General Alert, O x 3, WD, Ill appearing  Pulmonary Sym exp, good B air movt, CTA B  Cardiac RRR, Nl S1, S2, +murmur, No rubs, No S3,S4  Vascular Vessel Right Left  Radial Palpable Palpable  Brachial Palpable Palpable  Carotid Palpable, No Bruit Palpable, No Bruit  Aorta Not palpable N/A  Femoral Palpable Palpable  Popliteal AKA Not palpable  PT AKA Not palpable  DP AKA Not palpable    Gastro- intestinal soft, non-distended, non-tender to palpation, No guarding or rebound, no HSM, no masses, no CVAT B, No palpable prominent aortic pulse,    Musculo- skeletal M/S 5/5 throughout BUE, Extremities without ischemic changes,R AKA well healed, L foot with swelling in dorsum, +blanch erythema surround 2 cm ulcers in L dorsum, R RC AVF with  strong thrill and bruit  Neurologic Pain and light touch intact in extremities, Motor exam as listed above    Medical Decision Making   Hayden [name] is a 46 y.o. male who presents with: s/p R fem-BK pop bypass, s/p R AKA, L foot ulcer with cellulitis, Diabetes with complications, active tobacco use, ESRD requiring HD   This patient has significant distal tibial and digital arteries disease with calcification at multiple levels.  Unfortunately, he continues to smoke.  He needs an Aortogram, left leg runoff, and possible intervention to determine his revascularization options as he is at significant risk of left limb loss also.  I wrote for 1 g Vancomycin to be given after his hemodialysis x 10 days.  I discussed in depth with the patient the nature of atherosclerosis, and emphasized the importance of maximal medical management including strict control of blood pressure, blood glucose, and lipid levels, obtaining regular exercise, and cessation of smoking.    The patient is aware that without maximal medical management the underlying atherosclerotic disease process will progress, limiting the benefit of any interventions.  I discussed in depth with the patient the need for long term surveillance to improve the primary assisted patency of his bypass.  The patient agrees to cooperate with such. The patient is currently on a statin: Pravachol.  The patient is currently on an anti-platelet: ASA.  Thank you for allowing [place] to participate in this patient's care.   Adele Barthel, MD, FACS [facility] Office: [phone] Pager: [phone]

## 2017-04-08 ENCOUNTER — Other Ambulatory Visit: Payer: Self-pay | Admitting: Vascular Surgery

## 2017-04-16 ENCOUNTER — Telehealth: Payer: Self-pay | Admitting: *Deleted

## 2017-04-16 NOTE — Telephone Encounter (Signed)
Overnight msg from patient his desire to cancel procedure for 10/18 Aortogram. Spouse states his is sick and desires second opinion.

## 2017-04-17 ENCOUNTER — Encounter (HOSPITAL_COMMUNITY): Admission: RE | Payer: Self-pay | Source: Ambulatory Visit

## 2017-04-17 ENCOUNTER — Ambulatory Visit (HOSPITAL_COMMUNITY): Admission: RE | Admit: 2017-04-17 | Payer: 59 | Source: Ambulatory Visit | Admitting: Vascular Surgery

## 2017-04-17 SURGERY — ABDOMINAL AORTOGRAM W/LOWER EXTREMITY
Anesthesia: LOCAL

## 2017-04-24 ENCOUNTER — Telehealth (INDEPENDENT_AMBULATORY_CARE_PROVIDER_SITE_OTHER): Payer: Self-pay | Admitting: Orthopedic Surgery

## 2017-04-24 NOTE — Telephone Encounter (Signed)
Last 6 ov notes faxed to [facility] clinic [phone]

## 2017-04-28 ENCOUNTER — Ambulatory Visit (INDEPENDENT_AMBULATORY_CARE_PROVIDER_SITE_OTHER): Payer: Medicare Other | Admitting: Licensed Clinical Social Worker

## 2017-04-28 DIAGNOSIS — Z62810 Personal history of physical and sexual abuse in childhood: Secondary | ICD-10-CM | POA: Diagnosis not present

## 2017-04-28 DIAGNOSIS — F4323 Adjustment disorder with mixed anxiety and depressed mood: Secondary | ICD-10-CM | POA: Diagnosis not present

## 2017-04-28 DIAGNOSIS — Z993 Dependence on wheelchair: Secondary | ICD-10-CM

## 2017-04-28 DIAGNOSIS — F341 Dysthymic disorder: Secondary | ICD-10-CM | POA: Diagnosis not present

## 2017-04-29 ENCOUNTER — Encounter (HOSPITAL_COMMUNITY): Payer: Self-pay | Admitting: Licensed Clinical Social Worker

## 2017-04-29 ENCOUNTER — Ambulatory Visit (HOSPITAL_COMMUNITY): Payer: Medicare Other | Admitting: Psychiatry
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
## 2017-04-30 ENCOUNTER — Ambulatory Visit: Payer: Medicare Other | Admitting: Cardiology

## 2017-05-01 ENCOUNTER — Telehealth: Payer: Self-pay | Admitting: Cardiology

## 2017-05-01 ENCOUNTER — Telehealth: Payer: Self-pay | Admitting: *Deleted

## 2017-05-01 NOTE — Telephone Encounter (Signed)
Left message for patient to call and schedule appt per Dr. Lysbeth Penner instructions.

## 2017-05-01 NOTE — Telephone Encounter (Signed)
Patient of Dr. Ellyn Hack. DOD call received by Dr. Debara Pickett

## 2017-05-01 NOTE — Telephone Encounter (Addendum)
Staff message has been sent to scheduling pool to contact patient to arrange appt  Patient was scheduled as a "new patient" with Dr. Ellyn Hack on 04/30/17 and was a "no show"

## 2017-05-01 NOTE — Telephone Encounter (Signed)
D/w Dr. Truman Hayward at [facility] - Hayden [name] (a patient of Dr. Ellyn Hack) was noted to be bradycardic at dialysis.  An EKG was performed which shows complete heart block and heart rates in the 40s.  He is noted to be on labetalol 300 mg twice daily and Catapres 2 patch.  He is reportedly asymptomatic.  I advised discontinuing labetalol and we will arrange for a follow-up appointment early next week with Dr. Ellyn Hack or one of our advanced practice providers.  Should he have any further symptoms such as chest pain, shortness of breath, fatigue, syncope or associated symptoms, he should present to the emergency department  Dr. Debara Pickett

## 2017-05-02 ENCOUNTER — Encounter: Payer: Self-pay | Admitting: Cardiology

## 2017-05-02 ENCOUNTER — Inpatient Hospital Stay (HOSPITAL_COMMUNITY)
Admission: EM | Admit: 2017-05-02 | Discharge: 2017-05-06 | DRG: 242 | Disposition: A | Discharge status: Home / Self Care | Payer: Medicare Other | Attending: Cardiology | Admitting: Cardiology

## 2017-05-02 ENCOUNTER — Ambulatory Visit (INDEPENDENT_AMBULATORY_CARE_PROVIDER_SITE_OTHER): Payer: Medicare Other | Admitting: Cardiology

## 2017-05-02 ENCOUNTER — Encounter (HOSPITAL_COMMUNITY): Payer: Self-pay | Admitting: Emergency Medicine

## 2017-05-02 ENCOUNTER — Emergency Department (HOSPITAL_COMMUNITY): Payer: Medicare Other

## 2017-05-02 VITALS — BP 190/64 | HR 37 | Ht 69.0 in

## 2017-05-02 DIAGNOSIS — E1151 Type 2 diabetes mellitus with diabetic peripheral angiopathy without gangrene: Secondary | ICD-10-CM | POA: Diagnosis present

## 2017-05-02 DIAGNOSIS — E669 Obesity, unspecified: Secondary | ICD-10-CM | POA: Diagnosis present

## 2017-05-02 DIAGNOSIS — Z8673 Personal history of transient ischemic attack (TIA), and cerebral infarction without residual deficits: Secondary | ICD-10-CM | POA: Diagnosis not present

## 2017-05-02 DIAGNOSIS — G8929 Other chronic pain: Secondary | ICD-10-CM | POA: Diagnosis present

## 2017-05-02 DIAGNOSIS — Z905 Acquired absence of kidney: Secondary | ICD-10-CM

## 2017-05-02 DIAGNOSIS — M549 Dorsalgia, unspecified: Secondary | ICD-10-CM | POA: Diagnosis present

## 2017-05-02 DIAGNOSIS — F17210 Nicotine dependence, cigarettes, uncomplicated: Secondary | ICD-10-CM | POA: Diagnosis present

## 2017-05-02 DIAGNOSIS — D631 Anemia in chronic kidney disease: Secondary | ICD-10-CM | POA: Diagnosis present

## 2017-05-02 DIAGNOSIS — I4892 Unspecified atrial flutter: Secondary | ICD-10-CM | POA: Diagnosis not present

## 2017-05-02 DIAGNOSIS — G43909 Migraine, unspecified, not intractable, without status migrainosus: Secondary | ICD-10-CM | POA: Diagnosis present

## 2017-05-02 DIAGNOSIS — I442 Atrioventricular block, complete: Principal | ICD-10-CM

## 2017-05-02 DIAGNOSIS — I5040 Unspecified combined systolic (congestive) and diastolic (congestive) heart failure: Secondary | ICD-10-CM | POA: Diagnosis present

## 2017-05-02 DIAGNOSIS — Z85528 Personal history of other malignant neoplasm of kidney: Secondary | ICD-10-CM

## 2017-05-02 DIAGNOSIS — N186 End stage renal disease: Secondary | ICD-10-CM | POA: Diagnosis present

## 2017-05-02 DIAGNOSIS — E8889 Other specified metabolic disorders: Secondary | ICD-10-CM | POA: Diagnosis present

## 2017-05-02 DIAGNOSIS — R001 Bradycardia, unspecified: Secondary | ICD-10-CM | POA: Diagnosis present

## 2017-05-02 DIAGNOSIS — Z8709 Personal history of other diseases of the respiratory system: Secondary | ICD-10-CM

## 2017-05-02 DIAGNOSIS — Z79899 Other long term (current) drug therapy: Secondary | ICD-10-CM

## 2017-05-02 DIAGNOSIS — I739 Peripheral vascular disease, unspecified: Secondary | ICD-10-CM | POA: Diagnosis present

## 2017-05-02 DIAGNOSIS — E1122 Type 2 diabetes mellitus with diabetic chronic kidney disease: Secondary | ICD-10-CM | POA: Diagnosis present

## 2017-05-02 DIAGNOSIS — I10 Essential (primary) hypertension: Secondary | ICD-10-CM | POA: Diagnosis not present

## 2017-05-02 DIAGNOSIS — Z9189 Other specified personal risk factors, not elsewhere classified: Secondary | ICD-10-CM

## 2017-05-02 DIAGNOSIS — Z885 Allergy status to narcotic agent status: Secondary | ICD-10-CM

## 2017-05-02 DIAGNOSIS — Z89611 Acquired absence of right leg above knee: Secondary | ICD-10-CM

## 2017-05-02 DIAGNOSIS — Z7982 Long term (current) use of aspirin: Secondary | ICD-10-CM

## 2017-05-02 DIAGNOSIS — Z6829 Body mass index (BMI) 29.0-29.9, adult: Secondary | ICD-10-CM | POA: Diagnosis not present

## 2017-05-02 DIAGNOSIS — Z95818 Presence of other cardiac implants and grafts: Secondary | ICD-10-CM

## 2017-05-02 DIAGNOSIS — N2581 Secondary hyperparathyroidism of renal origin: Secondary | ICD-10-CM | POA: Diagnosis present

## 2017-05-02 DIAGNOSIS — E785 Hyperlipidemia, unspecified: Secondary | ICD-10-CM | POA: Diagnosis present

## 2017-05-02 DIAGNOSIS — Z95820 Peripheral vascular angioplasty status with implants and grafts: Secondary | ICD-10-CM

## 2017-05-02 DIAGNOSIS — I132 Hypertensive heart and chronic kidney disease with heart failure and with stage 5 chronic kidney disease, or end stage renal disease: Secondary | ICD-10-CM | POA: Diagnosis present

## 2017-05-02 DIAGNOSIS — F419 Anxiety disorder, unspecified: Secondary | ICD-10-CM | POA: Diagnosis present

## 2017-05-02 DIAGNOSIS — Z992 Dependence on renal dialysis: Secondary | ICD-10-CM

## 2017-05-02 DIAGNOSIS — F319 Bipolar disorder, unspecified: Secondary | ICD-10-CM | POA: Diagnosis present

## 2017-05-02 DIAGNOSIS — Z91048 Other nonmedicinal substance allergy status: Secondary | ICD-10-CM

## 2017-05-02 HISTORY — DX: Type 2 diabetes mellitus without complications: E11.9

## 2017-05-02 HISTORY — DX: Adverse effect of unspecified anesthetic, initial encounter: T41.45XA

## 2017-05-02 HISTORY — DX: Malignant neoplasm of unspecified kidney, except renal pelvis: C64.9

## 2017-05-02 HISTORY — DX: Other chronic pain: G89.29

## 2017-05-02 HISTORY — DX: Atrioventricular block, complete: I44.2

## 2017-05-02 HISTORY — DX: Bipolar disorder, unspecified: F31.9

## 2017-05-02 HISTORY — DX: End stage renal disease: N18.6

## 2017-05-02 HISTORY — DX: Dorsalgia, unspecified: M54.9

## 2017-05-02 HISTORY — DX: Other complications of anesthesia, initial encounter: T88.59XA

## 2017-05-02 HISTORY — DX: Unspecified convulsions: R56.9

## 2017-05-02 HISTORY — DX: Dependence on renal dialysis: Z99.2

## 2017-05-02 LAB — I-STAT TROPONIN, ED: TROPONIN I, POC: 0.11 ng/mL — AB (ref 0.00–0.08)

## 2017-05-02 LAB — I-STAT CHEM 8, ED
BUN: 98 mg/dL — AB (ref 6–20)
CHLORIDE: 101 mmol/L (ref 101–111)
CREATININE: 9 mg/dL — AB (ref 0.61–1.24)
Calcium, Ion: 0.96 mmol/L — ABNORMAL LOW (ref 1.15–1.40)
Glucose, Bld: 136 mg/dL — ABNORMAL HIGH (ref 65–99)
HEMATOCRIT: 40 % (ref 39.0–52.0)
HEMOGLOBIN: 13.6 g/dL (ref 13.0–17.0)
POTASSIUM: 4.9 mmol/L (ref 3.5–5.1)
Sodium: 134 mmol/L — ABNORMAL LOW (ref 135–145)
TCO2: 23 mmol/L (ref 22–32)

## 2017-05-02 LAB — CBC WITH DIFFERENTIAL/PLATELET
BASOS ABS: 0.1 10*3/uL (ref 0.0–0.1)
BASOS PCT: 1 %
Eosinophils Absolute: 0.4 10*3/uL (ref 0.0–0.7)
Eosinophils Relative: 4 %
HEMATOCRIT: 38.2 % — AB (ref 39.0–52.0)
HEMOGLOBIN: 12.1 g/dL — AB (ref 13.0–17.0)
LYMPHS PCT: 15 %
Lymphs Abs: 1.5 10*3/uL (ref 0.7–4.0)
MCH: 26.1 pg (ref 26.0–34.0)
MCHC: 31.7 g/dL (ref 30.0–36.0)
MCV: 82.5 fL (ref 78.0–100.0)
MONO ABS: 0.8 10*3/uL (ref 0.1–1.0)
MONOS PCT: 8 %
NEUTROS ABS: 7.3 10*3/uL (ref 1.7–7.7)
NEUTROS PCT: 72 %
Platelets: 421 10*3/uL — ABNORMAL HIGH (ref 150–400)
RBC: 4.63 MIL/uL (ref 4.22–5.81)
RDW: 16.4 % — AB (ref 11.5–15.5)
WBC: 10 10*3/uL (ref 4.0–10.5)

## 2017-05-02 LAB — COMPREHENSIVE METABOLIC PANEL
ALBUMIN: 3.1 g/dL — AB (ref 3.5–5.0)
ALK PHOS: 217 U/L — AB (ref 38–126)
ALT: 33 U/L (ref 17–63)
AST: 25 U/L (ref 15–41)
Anion gap: 16 — ABNORMAL HIGH (ref 5–15)
BILIRUBIN TOTAL: 0.8 mg/dL (ref 0.3–1.2)
BUN: 104 mg/dL — ABNORMAL HIGH (ref 6–20)
CHLORIDE: 97 mmol/L — AB (ref 101–111)
CO2: 21 mmol/L — AB (ref 22–32)
Calcium: 8.4 mg/dL — ABNORMAL LOW (ref 8.9–10.3)
Creatinine, Ser: 9.17 mg/dL — ABNORMAL HIGH (ref 0.61–1.24)
GFR calc Af Amer: 7 mL/min — ABNORMAL LOW (ref >60)
GFR, EST NON AFRICAN AMERICAN: 6 mL/min — AB (ref >60)
GLUCOSE: 138 mg/dL — AB (ref 65–99)
POTASSIUM: 5.1 mmol/L (ref 3.5–5.1)
Sodium: 134 mmol/L — ABNORMAL LOW (ref 135–145)
Total Protein: 7 g/dL (ref 6.5–8.1)

## 2017-05-02 LAB — SURGICAL PCR SCREEN
MRSA, PCR: NEGATIVE
Staphylococcus aureus: NEGATIVE

## 2017-05-02 LAB — I-STAT CG4 LACTIC ACID, ED: Lactic Acid, Venous: 1.8 mmol/L (ref 0.5–1.9)

## 2017-05-02 LAB — MAGNESIUM: Magnesium: 2.4 mg/dL (ref 1.7–2.4)

## 2017-05-02 MED ORDER — RENA-VITE PO TABS
1.0000 | ORAL_TABLET | Freq: Every day | ORAL | Status: DC
  Administered 2017-05-02 – 2017-05-05 (×3): 1 via ORAL
  Filled 2017-05-02 (×4): qty 1

## 2017-05-02 MED ORDER — FERRIC CITRATE 1 GM 210 MG(FE) PO TABS: 210.0000 mg | ORAL_TABLET | Freq: Three times a day (TID) | ORAL | Status: DC

## 2017-05-02 MED ORDER — HEPARIN SODIUM (PORCINE) 5000 UNIT/ML IJ SOLN
5000.0000 [IU] | Freq: Three times a day (TID) | INTRAMUSCULAR | Status: DC
  Administered 2017-05-02 – 2017-05-05 (×3): 5000 [IU] via SUBCUTANEOUS
  Filled 2017-05-02 (×5): qty 1

## 2017-05-02 MED ORDER — DIVALPROEX SODIUM 125 MG PO CSDR
500.0000 mg | DELAYED_RELEASE_CAPSULE | Freq: Two times a day (BID) | ORAL | Status: DC
  Administered 2017-05-02 – 2017-05-06 (×8): 500 mg via ORAL
  Filled 2017-05-02 (×9): qty 4

## 2017-05-02 MED ORDER — CINACALCET HCL 30 MG PO TABS
60.0000 mg | ORAL_TABLET | Freq: Every day | ORAL | Status: DC
  Administered 2017-05-03: 60 mg via ORAL
  Filled 2017-05-02: qty 2

## 2017-05-02 MED ORDER — HYDRALAZINE HCL 50 MG PO TABS
100.0000 mg | ORAL_TABLET | Freq: Two times a day (BID) | ORAL | Status: DC
  Administered 2017-05-02 – 2017-05-03 (×2): 100 mg via ORAL
  Filled 2017-05-02 (×2): qty 2

## 2017-05-02 MED ORDER — ASPIRIN EC 81 MG PO TBEC
81.0000 mg | DELAYED_RELEASE_TABLET | Freq: Every day | ORAL | Status: DC
  Administered 2017-05-03 – 2017-05-04 (×2): 81 mg via ORAL
  Filled 2017-05-02 (×2): qty 1

## 2017-05-02 MED ORDER — SODIUM CHLORIDE 0.9% FLUSH
3.0000 mL | Freq: Two times a day (BID) | INTRAVENOUS | Status: DC
  Administered 2017-05-02 – 2017-05-06 (×6): 3 mL via INTRAVENOUS

## 2017-05-02 MED ORDER — ESCITALOPRAM OXALATE 10 MG PO TABS
10.0000 mg | ORAL_TABLET | Freq: Every day | ORAL | Status: DC
  Administered 2017-05-03 – 2017-05-06 (×4): 10 mg via ORAL
  Filled 2017-05-02 (×4): qty 1

## 2017-05-02 MED ORDER — ALPRAZOLAM 0.5 MG PO TABS
1.0000 mg | ORAL_TABLET | Freq: Three times a day (TID) | ORAL | Status: DC
  Administered 2017-05-02 – 2017-05-06 (×10): 1 mg via ORAL
  Filled 2017-05-02 (×10): qty 2

## 2017-05-02 MED ORDER — SODIUM CHLORIDE 0.9 % IV SOLN
250.0000 mL | INTRAVENOUS | Status: DC | PRN
  Administered 2017-05-03: 250 mL via INTRAVENOUS

## 2017-05-02 MED ORDER — ONDANSETRON HCL 4 MG/2ML IJ SOLN: 4.0000 mg | Freq: Four times a day (QID) | INTRAMUSCULAR | Status: DC | PRN

## 2017-05-02 MED ORDER — ACETAMINOPHEN 325 MG PO TABS: 650.0000 mg | ORAL_TABLET | ORAL | Status: DC | PRN

## 2017-05-02 MED ORDER — GABAPENTIN 100 MG PO CAPS
100.0000 mg | ORAL_CAPSULE | Freq: Three times a day (TID) | ORAL | Status: DC
  Administered 2017-05-02 – 2017-05-06 (×10): 100 mg via ORAL
  Filled 2017-05-02 (×10): qty 1

## 2017-05-02 MED ORDER — ISOSORBIDE DINITRATE 5 MG PO TABS
2.5000 mg | ORAL_TABLET | Freq: Two times a day (BID) | ORAL | Status: DC
  Administered 2017-05-02 – 2017-05-06 (×8): 2.5 mg via ORAL
  Filled 2017-05-02 (×9): qty 1

## 2017-05-02 MED ORDER — AMLODIPINE BESYLATE 10 MG PO TABS
10.0000 mg | ORAL_TABLET | Freq: Every day | ORAL | Status: DC
  Administered 2017-05-03 – 2017-05-06 (×4): 10 mg via ORAL
  Filled 2017-05-02 (×4): qty 1

## 2017-05-02 MED ORDER — NEPRO/CARBSTEADY PO LIQD
237.0000 mL | Freq: Two times a day (BID) | ORAL | Status: DC
  Administered 2017-05-06: 237 mL via ORAL
  Filled 2017-05-02 (×8): qty 237

## 2017-05-02 MED ORDER — SODIUM CHLORIDE 0.9% FLUSH: 3.0000 mL | INTRAVENOUS | Status: DC | PRN

## 2017-05-02 MED ORDER — FERRIC CITRATE 1 GM 210 MG(FE) PO TABS
420.0000 mg | ORAL_TABLET | Freq: Three times a day (TID) | ORAL | Status: DC
  Administered 2017-05-03: 420 mg via ORAL
  Filled 2017-05-02 (×3): qty 2

## 2017-05-02 MED ORDER — FUROSEMIDE 80 MG PO TABS
80.0000 mg | ORAL_TABLET | Freq: Two times a day (BID) | ORAL | Status: DC
  Administered 2017-05-02 – 2017-05-06 (×5): 80 mg via ORAL
  Filled 2017-05-02 (×5): qty 1

## 2017-05-02 MED ORDER — PRAVASTATIN SODIUM 20 MG PO TABS
20.0000 mg | ORAL_TABLET | Freq: Every day | ORAL | Status: DC
  Administered 2017-05-03 – 2017-05-06 (×3): 20 mg via ORAL
  Filled 2017-05-02 (×3): qty 1

## 2017-05-02 MED ORDER — NITROGLYCERIN 0.4 MG SL SUBL
0.4000 mg | SUBLINGUAL_TABLET | SUBLINGUAL | Status: DC | PRN
  Administered 2017-05-03: 0.4 mg via SUBLINGUAL
  Filled 2017-05-02: qty 1

## 2017-05-02 NOTE — ED Notes (Signed)
Troponin result given to Dr. Jeneen Rinks
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
## 2017-05-02 NOTE — Telephone Encounter (Signed)
Patient has OV with Dr. Ellyn Hack today 05/02/17 @ 1040am

## 2017-05-02 NOTE — Patient Instructions (Signed)
PLEASE GO TO THE EMERGENCY ROOM  ?

## 2017-05-02 NOTE — ED Triage Notes (Signed)
Pt to ER sent from PCP for evaluation of HR 35. EKG showing complete heart block. Patient asymptomatic. NAD. All other vitals are stable.

## 2017-05-02 NOTE — Progress Notes (Signed)
Pt's SBP running in the 180's. Pt asymptomatic and denies any pain. NP on call paged. Will cont to monitor pt.

## 2017-05-02 NOTE — ED Provider Notes (Signed)
[facility] EMERGENCY DEPARTMENT Provider Note   CSN: [id number] Arrival date & time: 05/02/17  1305     History   Chief Complaint Chief Complaint  Patient presents with  . Bradycardia    HPI Hayden [name] is a 46 y.o. male. Chief complaint is low heart rate.  HPI 46 year old male. History of end-stage renal disease on 3 times weekly maintenance dialysis. Dialyzed today. He has had difficulty with heart rate being low last few days with some weakness.  No torus of breath. He has previous right leg amputation. Had no left lower extremity edema. No fever. No cough. No complaints.  Seen by his primary care physician yesterday. Was taking 300 twice a day of labetalol. This was discontinued. Continues on clonidine and other blood pressure medications. No other AV node block medications.  Past Medical History:  Diagnosis Date  . Anemia March 2014  . Cancer ([place])    Kidney  . CHF (congestive heart failure) ([place]) 2951   Acute systolic and diastolic CHF  . Depression    & rage --  was in counseling....great now  . Diabetes mellitus    NO DM SINCE LOST 130LBS  . ESRD on peritoneal dialysis ([place]) 2018   Started in-center HD approx 2015 for 2 years, then did about 1 year of home HD and then started peritoneal dialysis in early 2018.    . Gangrene ([place])    right leg and foot  . History of kidney cancer   . Hyperlipidemia   . Hypertension   . Obesity   . PONV (postoperative nausea and vomiting)   . Stroke [facility])     Patient Active Problem List   Diagnosis Date Noted  . Complete heart block ([place]) 05/02/2017  . ESRD on hemodialysis ([place])   . SOB (shortness of breath)   . Hypoxia   . Respiratory difficulty   . Metabolic encephalopathy   . Sepsis ([place])   . Depression   . Abdominal pain 02/07/2017  . Prediabetes   . Phantom limb pain ([place])   . Hypertensive urgency   . Type 2 diabetes mellitus with peripheral neuropathy (HCC)   . Chronic diastolic heart  failure ([place])   . Controlled type 2 diabetes mellitus with chronic kidney disease on chronic dialysis, without long-term current use of insulin ([place])   . ESRD on dialysis ([place])   . 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 Neuropathic pain   . Hypertensive crisis   . Labile blood pressure   . Type 2 diabetes mellitus with diabetic peripheral angiopathy and gangrene, without long-term current use of insulin ([place])   . Debility 11/22/2016  . Anemia of chronic disease   . Diabetes mellitus type 2 in nonobese (HCC)   . Confusion   . S/P femoral-popliteal bypass surgery   . Tobacco abuse   . Benign essential HTN   . History of CVA (cerebrovascular accident)   . Post-operative pain   . Acute respiratory failure with hypoxia ([place])   . Agitation 11/04/2016  . Acute encephalopathy 11/04/2016  . Hypokalemia 11/02/2016  . QT prolongation   . Gangrene of right foot ([place])   . Pressure injury of skin 10/24/2016  . Leukocytosis 10/23/2016  . Hyperkalemia 10/23/2016  . Abdominal hematoma 11/15/2015  . HCAP (healthcare-associated pneumonia) 11/15/2015  . Acute blood loss anemia 11/15/2015  . Absolute anemia   .  Right lower quadrant pain   . Pneumonia involving right lung 11/04/2015  . HTN (hypertension) 11/04/2015  . Noninfectious gastroenteritis and colitis 11/04/2015  . Gastroenteritis 11/04/2015  . CVA (cerebral infarction) 06/19/2015  . End stage renal disease on dialysis ([place]) 06/19/2015  . Lacunar infarct, acute 06/19/2015  . GERD (gastroesophageal reflux disease) 06/19/2015  . Anxiety and depression 06/19/2015  . Hypertension, well controlled 06/19/2015  . End stage renal disease ([place]) 01/21/2013  . Acute coronary syndrome ([place]) 09/22/2012  . History of partial Right nephrectomy for renal mass (2008) 09/22/2012  . Left ventricular hypertrophy (moderate) per ECHO 2010  09/22/2012  . Chronic systolic congestive heart failure/EF 40% PER echo 2010 09/22/2012  . 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 Acute kidney injury ([place]) 09/21/2012  . Chest pain 09/21/2012    Past Surgical History:  Procedure Laterality Date  . ABDOMINAL AORTOGRAM W/LOWER EXTREMITY N/A 10/28/2016   Procedure: Abdominal Aortogram w/Lower Extremity;  Surgeon: Angelia Mould, MD;  Location: [facility];  Service: Cardiovascular;  Laterality: N/A;  . AMPUTATION Right 11/05/2016   Procedure: AMPUTATION RIGHT FIRST RAY;  Surgeon: Conrad Lake McMurray, MD;  Location: [facility];  Service: Vascular;  Laterality: Right;  . AMPUTATION Right 12/04/2016   Procedure: RIGHT ABOVE KNEE AMPUTATION;  Surgeon: Newt Minion, MD;  Location: [facility];  Service: Orthopedics;  Laterality: Right;  . AV FISTULA PLACEMENT Left 01/26/2013   Procedure: ARTERIOVENOUS (AV) FISTULA CREATION - LEFT RADIAL CEPHALIC AVF;  Surgeon: Angelia Mould, MD;  Location: [facility];  Service: Vascular;  Laterality: Left;  . CAPD REMOVAL N/A 02/11/2017   Procedure: CONTINUOUS AMBULATORY PERITONEAL DIALYSIS  (CAPD) CATHETER REMOVAL;  Surgeon: Donnie Mesa, MD;  Location: [facility];  Service: General;  Laterality: N/A;  . CHOLECYSTECTOMY  11/06/2015   Procedure: LAPAROSCOPIC CHOLECYSTECTOMY;  Surgeon: Ralene Ok, MD;  Location: [facility];  Service: General;;  . FEMORAL-POPLITEAL BYPASS GRAFT Right 11/05/2016   Procedure: BYPASS GRAFT FEMORAL-POPLITEAL ARTERY USING NON-REVERSED RIGHT GREATER SAPPHENOUS VEIN;  Surgeon: Conrad Weleetka, MD;  Location: [facility];  Service: Vascular;  Laterality: Right;  . HERNIA REPAIR    . LOWER EXTREMITY ANGIOGRAM Right 10/30/2016   Procedure: Right  LOWER EXTREMITY ANGIOGRAM WITH RIGHT SUPERFICIAL FEMORAL ARTERY balloon angioplasty;  Surgeon: Conrad Torrance, MD;  Location: [facility];  Service: Vascular;  Laterality: Right;  . NEPHRECTOMY Right 2008   partial  . TESTICLE TORSION REDUCTION    . TONSILLECTOMY AND  ADENOIDECTOMY    . VEIN HARVEST Right 11/05/2016   Procedure: RIGHT GREATER SAPPHENOUS VEIN HARVEST;  Surgeon: Conrad Atlanta, MD;  Location: [facility];  Service: Vascular;  Laterality: Right;       Home Medications    Prior to Admission medications   Medication Sig Start Date End Date Taking? Authorizing Provider  ALPRAZolam Duanne Moron) 1 MG tablet Take 1 mg by mouth 3 (three) times daily. 02/06/17   [provider]  amLODipine (NORVASC) 10 MG tablet Take 1 tablet (10 mg total) by mouth daily. 12/13/16   Angiulli, Lavon Paganini, PA-C  aspirin EC 81 MG tablet Take 81 mg by mouth daily.    [provider]  cinacalcet (SENSIPAR) 60 MG tablet Take 1 tablet (60 mg total) by mouth daily. 12/13/16   Angiulli, Lavon Paganini, PA-C  cloNIDine (CATAPRES - DOSED IN MG/24 HR) 0.2 mg/24hr patch Place 1 patch (0.2 mg total) onto the skin once a week. Patient taking differently: Place 0.2 mg onto the skin every  Friday.  12/13/16   Angiulli, Lavon Paganini, PA-C  divalproex (DEPAKOTE SPRINKLE) 125 MG capsule Take 4 capsules (500 mg total) by mouth every 12 (twelve) hours. 12/13/16   Angiulli, Lavon Paganini, PA-C  escitalopram (LEXAPRO) 10 MG tablet Take by mouth.    [provider]  ferric citrate (AURYXIA) 1 GM 210 MG(Fe) tablet Take 2 tablets (420 mg total) by mouth 3 (three) times daily with meals. 12/13/16   Angiulli, Lavon Paganini, PA-C  ferric citrate (AURYXIA) 1 GM 210 MG(Fe) tablet Take 210 mg by mouth 3 (three) times daily.     [provider]  furosemide (LASIX) 80 MG tablet Take 80 mg by mouth 2 (two) times daily.    [provider]  gabapentin (NEURONTIN) 100 MG capsule Take 1 capsule (100 mg total) by mouth 3 (three) times daily. Patient taking differently: Take 100 mg by mouth 2 (two) times daily.  03/31/17   Suzan Slick, NP  hydrALAZINE (APRESOLINE) 100 MG tablet Take 1 tablet (100 mg total) by mouth every 8 (eight) hours. Patient taking differently: Take 100 mg by mouth 2 (two) times  daily.  12/13/16   Angiulli, Lavon Paganini, PA-C  isosorbide dinitrate (ISORDIL) 5 MG tablet Take 1 tablet (5 mg total) by mouth 2 (two) times daily. Patient taking differently: Take 2.5 mg by mouth 2 (two) times daily.  12/13/16   Angiulli, Lavon Paganini, PA-C  multivitamin (RENA-VIT) TABS tablet Take 1 tablet by mouth at bedtime. 12/13/16   Angiulli, Lavon Paganini, PA-C  omeprazole (PRILOSEC) 40 MG capsule Take 1 capsule (40 mg total) by mouth daily. 12/13/16   Angiulli, Lavon Paganini, PA-C  pravastatin (PRAVACHOL) 20 MG tablet Take 1 tablet (20 mg total) by mouth daily. 12/13/16   Angiulli, Lavon Paganini, PA-C    Family History Family History  Problem Relation Age of Onset  . Heart disease Mother        Heart Disease before age 36  . Deep vein thrombosis Father   . Heart attack Father   . Other Other   . Diabetes Sister     Social History Social History  Substance Use Topics  . Smoking status: Current Some Day Smoker    Packs/day: 0.25    Years: 10.00    Types: Cigarettes  . Smokeless tobacco: Never Used  . Alcohol use No     Comment: "6 pack occ."-- lasts him six months-STOPPED 2 YRS AGO     Allergies   Dilaudid [hydromorphone hcl]; Morphine and related; and Adhesive [tape]   Review of Systems Review of Systems  Constitutional: Negative for appetite change, chills, diaphoresis, fatigue and fever.  HENT: Negative for mouth sores, sore throat and trouble swallowing.   Eyes: Negative for visual disturbance.  Respiratory: Negative for cough, chest tightness, shortness of breath and wheezing.   Cardiovascular: Negative for chest pain.  Gastrointestinal: Negative for abdominal distention, abdominal pain, diarrhea, nausea and vomiting.  Endocrine: Negative for polydipsia, polyphagia and polyuria.  Genitourinary: Negative for dysuria, frequency and hematuria.  Musculoskeletal: Negative for gait problem.  Skin: Negative for color change, pallor and rash.  Neurological: Negative for dizziness, syncope,  light-headedness and headaches.  Hematological: Does not bruise/bleed easily.  Psychiatric/Behavioral: Negative for behavioral problems and confusion.     Physical Exam Updated Vital Signs BP (!) 179/74   Pulse (!) 34   Temp 97.9 F (36.6 C) (Oral)   Resp (!) 24   SpO2 94%   Physical Exam  Constitutional: He is oriented  to person, place, and time. He appears well-developed and well-nourished. No distress.  Awake and alert. Conversant. No distress.  HENT:  Head: Normocephalic.  Eyes: Pupils are equal, round, and reactive to light. Conjunctivae are normal. No scleral icterus.  Neck: Normal range of motion. Neck supple. No thyromegaly present.  Cardiovascular: Normal rate and regular rhythm.  Exam reveals no gallop and no friction rub.   No murmur heard. Bradycardic at 40. Complete heart block noted on the monitor. Systolic pressure 6:57. Perfusing well. Clinically not in congestive heart failure.  Pulmonary/Chest: Effort normal and breath sounds normal. No respiratory distress. He has no wheezes. He has no rales.  Abdominal: Soft. Bowel sounds are normal. He exhibits no distension. There is no tenderness. There is no rebound.  Musculoskeletal: Normal range of motion.  Right lower extremity amputated. No left lower cavity edema  Neurological: He is alert and oriented to person, place, and time.  Skin: Skin is warm and dry. No rash noted.  Psychiatric: He has a normal mood and affect. His behavior is normal.     ED Treatments / Results  Labs (all labs ordered are listed, but only abnormal results are displayed) Labs Reviewed  CBC WITH DIFFERENTIAL/PLATELET - Abnormal; Notable for the following:       Result Value   Hemoglobin 12.1 (*)    HCT 38.2 (*)    RDW 16.4 (*)    Platelets 421 (*)    All other components within normal limits  COMPREHENSIVE METABOLIC PANEL - Abnormal; Notable for the following:    Sodium 134 (*)    Chloride 97 (*)    CO2 21 (*)    Glucose, Bld 138  (*)    BUN 104 (*)    Creatinine, Ser 9.17 (*)    Calcium 8.4 (*)    Albumin 3.1 (*)    Alkaline Phosphatase 217 (*)    GFR calc non Af Amer 6 (*)    GFR calc Af Amer 7 (*)    Anion gap 16 (*)    All other components within normal limits  I-STAT CHEM 8, ED - Abnormal; Notable for the following:    Sodium 134 (*)    BUN 98 (*)    Creatinine, Ser 9.00 (*)    Glucose, Bld 136 (*)    Calcium, Ion 0.96 (*)    All other components within normal limits  I-STAT TROPONIN, ED - Abnormal; Notable for the following:    Troponin i, poc 0.11 (*)    All other components within normal limits  MAGNESIUM  I-STAT CG4 LACTIC ACID, ED    EKG  EKG Interpretation None       Radiology Dg Chest Port 1 View  Result Date: 05/02/2017 CLINICAL DATA:  Bradycardia. EXAM: PORTABLE CHEST 1 VIEW COMPARISON:  CT 02/14/2017.  Chest x-ray 16 2018 . FINDINGS: Cardiomegaly with mild pulmonary vascular prominence. Interim partial resolution of pulmonary interstitial prominence consistent with partial clearing of CHF. No pleural effusion or pneumothorax. IMPRESSION: Interim partial clearing of pulmonary interstitial edema. Electronically Signed   By: Marcello Moores  Register   On: 05/02/2017 13:50    Procedures Procedures (including critical care time)  Medications Ordered in ED Medications - No data to display   Initial Impression / Assessment and Plan / ED Course  I have reviewed the triage vital signs and the nursing notes.  Pertinent labs & imaging results that were available during my care of the patient were reviewed by me and considered in  my medical decision making (see chart for details).    EKG confirms complete heart block. Discussed with cardiology/EP cardiology. They're here seeing the patient. Plan will be admission. He may need additional time or dialysis to dialyze medications. May require PPM. Clinically stable with adequate blood pressures, good perfusion.  Final Clinical Impressions(s) / ED  Diagnoses   Final diagnoses:  Complete heart block [facility])    New Prescriptions New Prescriptions   No medications on file     Tanna Furry, MD 05/02/17 1549
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
## 2017-05-03 LAB — LIPID PANEL
CHOL/HDL RATIO: 2.7 ratio
Cholesterol: 144 mg/dL (ref 0–200)
HDL: 53 mg/dL (ref >40)
LDL Cholesterol: 79 mg/dL (ref 0–99)
Triglycerides: 61 mg/dL (ref <150)
VLDL: 12 mg/dL (ref 0–40)

## 2017-05-03 LAB — BASIC METABOLIC PANEL
Anion gap: 19 — ABNORMAL HIGH (ref 5–15)
BUN: 118 mg/dL — AB (ref 6–20)
CO2: 18 mmol/L — ABNORMAL LOW (ref 22–32)
Calcium: 7.9 mg/dL — ABNORMAL LOW (ref 8.9–10.3)
Chloride: 98 mmol/L — ABNORMAL LOW (ref 101–111)
Creatinine, Ser: 9.67 mg/dL — ABNORMAL HIGH (ref 0.61–1.24)
GFR calc Af Amer: 7 mL/min — ABNORMAL LOW (ref >60)
GFR, EST NON AFRICAN AMERICAN: 6 mL/min — AB (ref >60)
GLUCOSE: 97 mg/dL (ref 65–99)
POTASSIUM: 4.9 mmol/L (ref 3.5–5.1)
Sodium: 135 mmol/L (ref 135–145)

## 2017-05-03 LAB — CBC
HCT: 36.3 % — ABNORMAL LOW (ref 39.0–52.0)
Hemoglobin: 11.5 g/dL — ABNORMAL LOW (ref 13.0–17.0)
MCH: 26 pg (ref 26.0–34.0)
MCHC: 31.7 g/dL (ref 30.0–36.0)
MCV: 81.9 fL (ref 78.0–100.0)
PLATELETS: 420 10*3/uL — AB (ref 150–400)
RBC: 4.43 MIL/uL (ref 4.22–5.81)
RDW: 16.3 % — ABNORMAL HIGH (ref 11.5–15.5)
WBC: 9.7 10*3/uL (ref 4.0–10.5)

## 2017-05-03 LAB — TROPONIN I
TROPONIN I: 0.2 ng/mL — AB (ref <0.03)
Troponin I: 0.18 ng/mL (ref <0.03)

## 2017-05-03 MED ORDER — LOPERAMIDE HCL 2 MG PO CAPS
4.0000 mg | ORAL_CAPSULE | ORAL | Status: DC | PRN
  Administered 2017-05-03 – 2017-05-04 (×2): 4 mg via ORAL
  Filled 2017-05-03 (×3): qty 2

## 2017-05-03 MED ORDER — HYDRALAZINE HCL 50 MG PO TABS
75.0000 mg | ORAL_TABLET | Freq: Three times a day (TID) | ORAL | Status: DC
  Administered 2017-05-03 – 2017-05-06 (×8): 75 mg via ORAL
  Filled 2017-05-03 (×8): qty 1

## 2017-05-03 MED ORDER — ISOPROTERENOL HCL 0.2 MG/ML IJ SOLN
1.0000 ug/min | INTRAVENOUS | Status: DC
  Administered 2017-05-03: 2 ug/min via INTRAVENOUS
  Administered 2017-05-04: 3 ug/min via INTRAVENOUS
  Filled 2017-05-03 (×5): qty 5

## 2017-05-03 MED ORDER — NICOTINE 21 MG/24HR TD PT24
21.0000 mg | MEDICATED_PATCH | Freq: Every day | TRANSDERMAL | Status: DC
  Administered 2017-05-03 – 2017-05-06 (×4): 21 mg via TRANSDERMAL
  Filled 2017-05-03 (×4): qty 1

## 2017-05-03 MED ORDER — TRAMADOL HCL 50 MG PO TABS
50.0000 mg | ORAL_TABLET | Freq: Four times a day (QID) | ORAL | Status: DC | PRN
  Administered 2017-05-03 – 2017-05-05 (×3): 50 mg via ORAL
  Filled 2017-05-03 (×3): qty 1

## 2017-05-03 MED ORDER — CLONIDINE HCL 0.2 MG/24HR TD PTWK
0.2000 mg | MEDICATED_PATCH | TRANSDERMAL | Status: DC
  Administered 2017-05-03 – 2017-05-06 (×2): 0.2 mg via TRANSDERMAL
  Filled 2017-05-03 (×2): qty 1

## 2017-05-03 MED ORDER — FERRIC CITRATE 1 GM 210 MG(FE) PO TABS
630.0000 mg | ORAL_TABLET | Freq: Three times a day (TID) | ORAL | Status: DC
  Administered 2017-05-04 – 2017-05-06 (×2): 630 mg via ORAL
  Filled 2017-05-03 (×11): qty 3

## 2017-05-03 NOTE — Progress Notes (Signed)
Nutrition Brief Note  Patient identified on the Malnutrition Screening Tool (MST) Report. Had reported wt loss and poor appetite  Patient reports that he was as his baseline this past year. His documented weight history is misleading. It has been largely impacted by an AKA and stopping peritoneal dialysis this past summer. He gives a new dry weight of 84.5. Bed weight today is 205.5 or 93.4 kg  He is seen eating skittles. He says there is very few foods he likes that "he can have". He has gotten to the point where he "would rather just not eat". He says he has told the outpatient RD that "her job could be replaced by a 3 page pamphlet" and no longer talks to her. RD Asked if he would like education and he states "If that means I can quit talking to you".   He is quite frustrated with the restrictions he faces. He says he is "tired by being bitched at by the doctor, PA and dietitian" and frankly does not want to talk to or see them anymore. He says he is always told that he has fluid on him when he feels he has absolutely none. He says "no one knows me". He feels like he is a number, rather than an individual. RD expressed empathy and that he was only seeing him due to him triggering for a screen, not to provide education due to noncompliance. Provided supported listening. With the exception of a couple statements, patient was cordial, understanding and polite.   RD asked if their were any foods/supplements that he would want. He said that hospital could offer nothing he wanted. No nutrition interventions warranted at this time. If nutrition issues arise, please consult RD.   Burtis Junes RD, LDN, CNSC Clinical Nutrition Pager: [phone] 05/03/2017 12:30 PM

## 2017-05-03 NOTE — Progress Notes (Addendum)
Pt resting in bed and woke up from his nap saying he feels very lethargic. Pt keeps making comments " I can not go back to sleep or I will not wake up" and "I don't think I am going to make it." Pt is pale and diaphoretic. Pt states he a some chest pressure. Np on call made aware and came up to see pt.

## 2017-05-03 NOTE — Progress Notes (Signed)
Progress Note  Patient Name: Hayden [name] Date of Encounter: 05/03/2017  Primary Cardiologist: Ellyn Hack  Subjective   Continues to feel well.  Anxious about pacemaker implantation scheduled for Monday.  Blood pressure continues to be high.  Has been desaturating overnight.  Refuses nasal cannula.  Inpatient Medications    Scheduled Meds: . ALPRAZolam  1 mg Oral TID  . amLODipine  10 mg Oral Daily  . aspirin EC  81 mg Oral Daily  . cinacalcet  60 mg Oral Q breakfast  . divalproex  500 mg Oral Q12H  . escitalopram  10 mg Oral Daily  . feeding supplement (NEPRO CARB STEADY)  237 mL Oral BID BM  . ferric citrate  420 mg Oral TID WC  . furosemide  80 mg Oral BID  . gabapentin  100 mg Oral TID  . heparin  5,000 Units Subcutaneous Q8H  . hydrALAZINE  75 mg Oral Q8H  . isosorbide dinitrate  2.5 mg Oral BID  . multivitamin  1 tablet Oral QHS  . nicotine  21 mg Transdermal Daily  . pravastatin  20 mg Oral Daily  . sodium chloride flush  3 mL Intravenous Q12H   Continuous Infusions: . sodium chloride     PRN Meds: sodium chloride, acetaminophen, loperamide, nitroGLYCERIN, ondansetron (ZOFRAN) IV, sodium chloride flush   Vital Signs    Vitals:   05/02/17 2100 05/03/17 0741 05/03/17 0742 05/03/17 0755  BP:  (!) 198/67  (!) 189/69  Pulse:  (!) 35  (!) 36  Resp:   16 13  Temp:    98.1 F (36.7 C)  TempSrc:    Oral  SpO2:   93% 95%  Height: 5\' 9"  (1.753 m)       Intake/Output Summary (Last 24 hours) at 05/03/17 0840 Last data filed at 05/02/17 2200  Gross per 24 hour  Intake                3 ml  Output                0 ml  Net                3 ml   There were no vitals filed for this visit.  Telemetry    Complete heart block- Personally Reviewed  ECG    Complete heart block- Personally Reviewed  Physical Exam   GEN: No acute distress.   Neck: No JVD Cardiac:  Bradycardic, no murmurs, rubs, or gallops.  Respiratory: Clear to auscultation  bilaterally. GI: Soft, nontender, non-distended  MS: No edema; No deformity. Neuro:  Nonfocal  Psych: Normal affect   Labs    Chemistry Recent Labs Lab 05/02/17 1342 05/02/17 1349 05/03/17 0556  NA 134* 134* 135  K 5.1 4.9 4.9  CL 97* 101 98*  CO2 21*  --  18*  GLUCOSE 138* 136* 97  BUN 104* 98* 118*  CREATININE 9.17* 9.00* 9.67*  CALCIUM 8.4*  --  7.9*  PROT 7.0  --   --   ALBUMIN 3.1*  --   --   AST 25  --   --   ALT 33  --   --   ALKPHOS 217*  --   --   BILITOT 0.8  --   --   GFRNONAA 6*  --  6*  GFRAA 7*  --  7*  ANIONGAP 16*  --  19*     Hematology Recent Labs Lab 05/02/17 1342 05/02/17 1349  05/03/17 0556  WBC 10.0  --  9.7  RBC 4.63  --  4.43  HGB 12.1* 13.6 11.5*  HCT 38.2* 40.0 36.3*  MCV 82.5  --  81.9  MCH 26.1  --  26.0  MCHC 31.7  --  31.7  RDW 16.4*  --  16.3*  PLT 421*  --  420*    Cardiac EnzymesNo results for input(s): TROPONINI in the last 168 hours.  Recent Labs Lab 05/02/17 1353  TROPIPOC 0.11*     BNPNo results for input(s): BNP, PROBNP in the last 168 hours.   DDimer No results for input(s): DDIMER in the last 168 hours.   Radiology    Dg Chest Port 1 View  Result Date: 05/02/2017 CLINICAL DATA:  Bradycardia. EXAM: PORTABLE CHEST 1 VIEW COMPARISON:  CT 02/14/2017.  Chest x-ray 16 2018 . FINDINGS: Cardiomegaly with mild pulmonary vascular prominence. Interim partial resolution of pulmonary interstitial prominence consistent with partial clearing of CHF. No pleural effusion or pneumothorax. IMPRESSION: Interim partial clearing of pulmonary interstitial edema. Electronically Signed   By: Marcello Moores  Register   On: 05/02/2017 13:50    Cardiac Studies   TTE 02/15/17 - Left ventricle: The cavity size was normal. Systolic function was   normal. The estimated ejection fraction was in the range of 60%   to 65%. Wall motion was normal; there were no regional wall   motion abnormalities. Doppler parameters are consistent with a    reversible restrictive pattern, indicative of decreased left   ventricular diastolic compliance and/or increased left atrial   pressure (grade 3 diastolic dysfunction). - Aortic valve: There was moderate stenosis. Peak velocity (S): 320   cm/s. Mean gradient (S): 21 mm Hg. Valve area (VTI): 1.38 cm^2.   Valve area (Vmax): 1.47 cm^2. Valve area (Vmean): 1.37 cm^2. - Mitral valve: Moderately calcified annulus. Valve area by   continuity equation (using LVOT flow): 2.13 cm^2. - Tricuspid valve: There was moderate regurgitation. - Pulmonary arteries: Systolic pressure was mildly increased. PA   peak pressure: 35 mm Hg (S).  Patient Profile     46 y.o. male with history of PAD status post left AKA, ESRD on dialysis, diabetes, hypertension, and prior CVA presenting to the hospital with complete heart block.  Assessment & Plan    1.  Complete heart block Patient relatively asymptomatic.  All medications have since washed out.  Continues to have complete heart block.  Plan for pacemaker on Monday.  2.  ESRD on HD Have contacted nephrology for recommendations on dialysis.  Would also like their input on left-sided versus right-sided pacemaker implant with his dialysis access in the left forearm.  3.  HTN Significantly elevated.  Is on hydralazine and Norvasc.  This Madalynne Gutmann likely be helped by pacing in the future.  Currently without headaches or blurry vision.  For questions or updates, please contact [facility] Please consult [email or web address] for contact info under Cardiology/STEMI.      Signed, Kinleigh Nault Meredith Leeds, MD  05/03/2017, 8:40 AM

## 2017-05-03 NOTE — Progress Notes (Signed)
Spoke with Np pt transferred to 2 heart. Report given 2 Heart nurse. Pt's son at bedside and stated he called pt's wife and made her aware of his transfer. Pt's belongings transferred with son.

## 2017-05-03 NOTE — Progress Notes (Addendum)
Discussed  With Nurse on call,  Reviewed recent cardiology  And Dr Marissa Nestle  recs and  Notes,  Pt HR   Around 38-40,  SLIGHT SYMPTOMS, slight  Chest pain  Iusprel  Gtt  As per  Earlier recs re started,   I text informed  And updated EP service Dr Curt Bears

## 2017-05-03 NOTE — Progress Notes (Signed)
Dr Teena Dunk notified of patient having persistent complaints of chest pain 6/10, with associated back pain, anxiety and feelings of impeding doom verbalizing that he is seeing angels in the room. NTG SL administered x1 with no relief. Patient refused any additional NTG due to headache. Patient hypertensive (SBP 170's-190's); HR 30's; vital signs as per noted in chart. New order received to initiate Isuprel drip as previously ordered. Family at bedside; provided update re: plan of care.

## 2017-05-03 NOTE — Progress Notes (Signed)
Pt oxygen levels continue to drop especially while sleeping.  Pt refuses to wear nasal canula because he says it makes him feel claustrophobic.  Pt complains of dizziness and feeling weak. RN explains to pt that the lack of proper oxygenation can have that effect.  Pt denies any pain/chest pain.  Pt made aware of the stress that is put on his heart when this happens as well.  Pt says he is sorry for being difficult but he is unable to wear it.  RN informed pt that when he goes to sleep the O2 will be put on and he can remove it when he wakes up.  Pt agreed.  While awake pts wife reminds pt take deep breaths to restore proper oxygenation.  Pt snores and seems to suffer from sleep apnea.  Pt has displayed agitation and anxiety.  Pt is being allowed to express feelings of fear and discomfort.  Wife at bedside and being supportive.  Contacting MD. Will continue to monitor for safety.

## 2017-05-03 NOTE — Progress Notes (Signed)
Isoprel gtt on hold per Dr. Curt Bears. If pt's condition deteriorates , to call md and assess if need to start gtt. Hayden [name].
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
## 2017-05-03 NOTE — Progress Notes (Signed)
Pt c/o of 2/10 left neck pain while resting in bed. Pt had artifact on the monitor that rang off as V tach. Strip reviewed by MD.  Marthann Schiller obtained. BP still elevated. Pt currently pain free. Pain resolved on its own. Pt denies any SOB, blurred vision or headache. Np aware.  Will cont to monitor pt.

## 2017-05-03 NOTE — Progress Notes (Signed)
    Called by RN to assess patient as he reported increased weakness, and chest heaviness. On exam patient is pale, diaphoretic, and lethargic. Discussed with Dr. Curt Bears and will plan to transfer to ICU, and start Isuprel gtt. Planned for PPM on Monday, may need to go sooner if does not improve with gtt.   SignedReino Bellis, NP-C 05/03/2017, 1:46 PM Pager: [phone]

## 2017-05-04 LAB — RENAL FUNCTION PANEL
ANION GAP: 20 — AB (ref 5–15)
Albumin: 3.1 g/dL — ABNORMAL LOW (ref 3.5–5.0)
BUN: 149 mg/dL — ABNORMAL HIGH (ref 6–20)
CALCIUM: 7.8 mg/dL — AB (ref 8.9–10.3)
CHLORIDE: 98 mmol/L — AB (ref 101–111)
CO2: 16 mmol/L — ABNORMAL LOW (ref 22–32)
Creatinine, Ser: 11.82 mg/dL — ABNORMAL HIGH (ref 0.61–1.24)
GFR calc Af Amer: 5 mL/min — ABNORMAL LOW (ref >60)
GFR calc non Af Amer: 5 mL/min — ABNORMAL LOW (ref >60)
Glucose, Bld: 126 mg/dL — ABNORMAL HIGH (ref 65–99)
PHOSPHORUS: 15 mg/dL — AB (ref 2.5–4.6)
POTASSIUM: 5.8 mmol/L — AB (ref 3.5–5.1)
SODIUM: 134 mmol/L — AB (ref 135–145)

## 2017-05-04 MED ORDER — HYDRALAZINE HCL 20 MG/ML IJ SOLN
INTRAMUSCULAR | Status: AC
  Administered 2017-05-04: 10 mg
  Filled 2017-05-04: qty 1

## 2017-05-04 MED ORDER — HYDRALAZINE HCL 20 MG/ML IJ SOLN
10.0000 mg | Freq: Once | INTRAMUSCULAR | Status: DC
  Filled 2017-05-04: qty 1

## 2017-05-04 MED ORDER — HYDRALAZINE HCL 20 MG/ML IJ SOLN: 10.0000 mg | Freq: Once | INTRAMUSCULAR | Status: AC

## 2017-05-04 MED ORDER — ALPRAZOLAM 0.5 MG PO TABS
1.0000 mg | ORAL_TABLET | Freq: Once | ORAL | Status: AC
  Administered 2017-05-04: 1 mg via ORAL
  Filled 2017-05-04: qty 2

## 2017-05-04 NOTE — Telephone Encounter (Signed)
-   Pt was not mine until he came in for visit on 11/2 - missed 10/31 visit.  Was sent to ER - admitted for PPM.  Glenetta Hew c

## 2017-05-04 NOTE — Progress Notes (Signed)
Pt BP during HD 242/80.  Dialysis RN at bedside and notified [place].  Dr. Jonnie Finner advised to call cardiology on call for BP meds.  Orders received from Cardiology fellow.  Will continue to monitor closely.

## 2017-05-04 NOTE — Progress Notes (Signed)
EKG CRITICAL VALUE     12 lead EKG performed.  Critical value noted.  Glenard Haring, RN notified.   Marilynne Halsted 05/04/2017 7:44 AM

## 2017-05-04 NOTE — Progress Notes (Signed)
Spoke with Crystal in pharmacy to clarify "allergy alert" notification received with Tramadol prn order and made aware that Tramadol is not a patient allergy, however does have the potential to prolong QTc which warrants close monitoring.

## 2017-05-04 NOTE — Progress Notes (Signed)
[facility] Progress Note  Subjective: chest not hurting like it was yesterday, lots of anxiety  Vitals:   05/04/17 0135 05/04/17 0300 05/04/17 0400 05/04/17 0500  BP: (!) 180/62 (!) 180/55 (!) 189/69 (!) 184/56  Pulse: (!) 31 (!) 34 (!) 36 (!) 35  Resp: (!) 21 (!) 22 20 (!) 23  Temp:  97.7 F (36.5 C)    TempSrc:  Oral    SpO2: 95% 97% 99% 97%  Height:        Inpatient medications: . ALPRAZolam  1 mg Oral TID  . amLODipine  10 mg Oral Daily  . aspirin EC  81 mg Oral Daily  . cloNIDine  0.2 mg Transdermal Q Fri-1800  . divalproex  500 mg Oral Q12H  . escitalopram  10 mg Oral Daily  . feeding supplement (NEPRO CARB STEADY)  237 mL Oral BID BM  . ferric citrate  630 mg Oral TID WC  . furosemide  80 mg Oral BID  . gabapentin  100 mg Oral TID  . heparin  5,000 Units Subcutaneous Q8H  . hydrALAZINE  75 mg Oral Q8H  . isosorbide dinitrate  2.5 mg Oral BID  . multivitamin  1 tablet Oral QHS  . nicotine  21 mg Transdermal Daily  . pravastatin  20 mg Oral Daily  . sodium chloride flush  3 mL Intravenous Q12H   . sodium chloride 250 mL (05/03/17 2105)  . isoproterenol (ISUPREL) infusion 3 mcg/min (05/04/17 0300)   sodium chloride, acetaminophen, loperamide, nitroGLYCERIN, ondansetron (ZOFRAN) IV, sodium chloride flush, traMADol  Exam: General: WDWN , in bed, resting, no distress  Head: NCAT sclera not icteric MMM Neck: Supple. No JVD No masses Lungs: CTA bilaterally without wheezes, rales, or rhonchi. Breathing is unlabored. Heart: bradycardic No mrm Abdomen: soft NT + BS Lower extremities: R BKA, No L LE edema  Neuro: A & O  X 3. Moves all extremities spontaneously. Psych:  Responds to questions appropriately with a normal affect. Dialysis Access: LUE AVF +bruit   Dialysis: [facility] 4h   84kg   2/2.25   L AVF  Hep 2400 No VDRA/ ESA Auryxia 3 ac      Impression: 1. Bradycardia/Complete heart block - for pacemaker placement Monday per Dr. Curt Bears. Dr.  Jonnie Finner suggests right-sided placement with L AVF.   2. ESRD -  MWF. Held off on HD last night due to CP patient was having.  Trop's are flat and EKG unremarkable.  Plan HD today, and then short HD tomorrow due to PPM procedure.   3. HTN/volume  - cont meds (amlod/ hydral/ clon), get vol down with HD, get hoyer wts  4. Anemia  - Hgb 11.5, no ESA needs  5.  Metabolic bone disease -  Continue binders/ Last outpatient PTH 110 - No VDRA, hold sensipar  6.  Nutrition - Renal diet/vitamins 7. DM - per primary     Plan - as above   Kelly Splinter MD [facility] pager [phone]   05/04/2017, 6:06 AM   Recent Labs  Lab 05/02/17 1342 05/02/17 1349 05/03/17 0556  NA 134* 134* 135  K 5.1 4.9 4.9  CL 97* 101 98*  CO2 21*  --  18*  GLUCOSE 138* 136* 97  BUN 104* 98* 118*  CREATININE 9.17* 9.00* 9.67*  CALCIUM 8.4*  --  7.9*   Recent Labs  Lab 05/02/17 1342  AST 25  ALT 33  ALKPHOS 217*  BILITOT 0.8  PROT 7.0  ALBUMIN 3.1*   Recent Labs  Lab 05/02/17 1342 05/02/17 1349 05/03/17 0556  WBC 10.0  --  9.7  NEUTROABS 7.3  --   --   HGB 12.1* 13.6 11.5*  HCT 38.2* 40.0 36.3*  MCV 82.5  --  81.9  PLT 421*  --  420*   Iron/TIBC/Ferritin/ %Sat    Component Value Date/Time   IRON 18 (L) 12/05/2016 2039   TIBC 185 (L) 12/05/2016 2039   FERRITIN 1,444 (H) 11/15/2015 1342   IRONPCTSAT 10 (L) 12/05/2016 2039

## 2017-05-04 NOTE — Progress Notes (Signed)
Arrived to patient room 2H-15 at 71.  Reviewed treatment plan and this RN agrees.  Report received from bedside RN, Levada Dy.  Consent obtained.  Patient A & O X 4. Lung sounds diminished to ausculation in all fields. Generalized non pitting edema. Cardiac: HB, 3rd degree.  Prepped LLAVF with alcohol and cannulated with two 15 gauge needles.  Pulsation of blood noted.  Flushed access well with saline per protocol.  Connected and secured lines and initiated tx at 2000.  UF goal of 3500 mL and net fluid removal of 3000 mL.  Will continue to monitor.

## 2017-05-04 NOTE — Progress Notes (Addendum)
Progress Note  Patient Name: Hayden [name] Date of Encounter: 05/04/2017  Primary Cardiologist: Ellyn Hack  Subjective   Moved to the ICU due to chest pain, weakness, and somnolence. Has been doing better today. Continues to have chest pain with flat troponins. Has been answering questions appropriately but sleeping during questioning. Per the patient's wife, she feels that this may be all due to anxiety and depression.  Inpatient Medications    Scheduled Meds: . ALPRAZolam  1 mg Oral TID  . amLODipine  10 mg Oral Daily  . aspirin EC  81 mg Oral Daily  . cloNIDine  0.2 mg Transdermal Q Fri-1800  . divalproex  500 mg Oral Q12H  . escitalopram  10 mg Oral Daily  . feeding supplement (NEPRO CARB STEADY)  237 mL Oral BID BM  . ferric citrate  630 mg Oral TID WC  . furosemide  80 mg Oral BID  . gabapentin  100 mg Oral TID  . heparin  5,000 Units Subcutaneous Q8H  . hydrALAZINE  75 mg Oral Q8H  . isosorbide dinitrate  2.5 mg Oral BID  . multivitamin  1 tablet Oral QHS  . nicotine  21 mg Transdermal Daily  . pravastatin  20 mg Oral Daily  . sodium chloride flush  3 mL Intravenous Q12H   Continuous Infusions: . sodium chloride 250 mL (05/03/17 2105)  . isoproterenol (ISUPREL) infusion 3 mcg/min (05/04/17 0300)   PRN Meds: sodium chloride, acetaminophen, loperamide, nitroGLYCERIN, ondansetron (ZOFRAN) IV, sodium chloride flush, traMADol   Vital Signs    Vitals:   05/04/17 0400 05/04/17 0500 05/04/17 0600 05/04/17 0700  BP: (!) 189/69 (!) 184/56 (!) 187/62 (!) 187/62  Pulse: (!) 36 (!) 35 (!) 35 (!) 37  Resp: 20 (!) 23 (!) 23 (!) 22  Temp:      TempSrc:      SpO2: 99% 97% 90% 96%  Weight:   204 lb 12.9 oz (92.9 kg)   Height:        Intake/Output Summary (Last 24 hours) at 05/04/2017 0836 Last data filed at 05/04/2017 0700 Gross per 24 hour  Intake 1260.67 ml  Output -  Net 1260.67 ml   Filed Weights   05/04/17 0600  Weight: 204 lb 12.9 oz (92.9 kg)    Telemetry     Complete AV block- Personally Reviewed  ECG    Sinus rhythm with complete AV block- Personally Reviewed  Physical Exam   GEN: Mildly somnolent, well-nourished well-developed HEENT: normal  Neck: no JVD, carotid bruits, or masses Cardiac: Bradycardic, regular; no murmurs, rubs, or gallops,no edema  Respiratory:  clear to auscultation bilaterally, normal work of breathing GI: soft, nontender, nondistended, + BS MS: Right AKA Skin: warm and dry Neuro:  Strength and sensation are intact Psych: euthymic mood, full affect   Labs    Chemistry Recent Labs  Lab 05/02/17 1342 05/02/17 1349 05/03/17 0556  NA 134* 134* 135  K 5.1 4.9 4.9  CL 97* 101 98*  CO2 21*  --  18*  GLUCOSE 138* 136* 97  BUN 104* 98* 118*  CREATININE 9.17* 9.00* 9.67*  CALCIUM 8.4*  --  7.9*  PROT 7.0  --   --   ALBUMIN 3.1*  --   --   AST 25  --   --   ALT 33  --   --   ALKPHOS 217*  --   --   BILITOT 0.8  --   --   GFRNONAA 6*  --  6*  GFRAA 7*  --  7*  ANIONGAP 16*  --  19*     Hematology Recent Labs  Lab 05/02/17 1342 05/02/17 1349 05/03/17 0556  WBC 10.0  --  9.7  RBC 4.63  --  4.43  HGB 12.1* 13.6 11.5*  HCT 38.2* 40.0 36.3*  MCV 82.5  --  81.9  MCH 26.1  --  26.0  MCHC 31.7  --  31.7  RDW 16.4*  --  16.3*  PLT 421*  --  420*    Cardiac Enzymes Recent Labs  Lab 05/03/17 1536 05/03/17 2055  TROPONINI 0.20* 0.18*    Recent Labs  Lab 05/02/17 1353  TROPIPOC 0.11*     BNPNo results for input(s): BNP, PROBNP in the last 168 hours.   DDimer No results for input(s): DDIMER in the last 168 hours.   Radiology    Dg Chest Port 1 View  Result Date: 05/02/2017 CLINICAL DATA:  Bradycardia. EXAM: PORTABLE CHEST 1 VIEW COMPARISON:  CT 02/14/2017.  Chest x-ray 16 2018 . FINDINGS: Cardiomegaly with mild pulmonary vascular prominence. Interim partial resolution of pulmonary interstitial prominence consistent with partial clearing of CHF. No pleural effusion or pneumothorax.  IMPRESSION: Interim partial clearing of pulmonary interstitial edema. Electronically Signed   By: Marcello Moores  Register   On: 05/02/2017 13:50    Cardiac Studies   TTE 02/15/17 - Left ventricle: The cavity size was normal. Systolic function was   normal. The estimated ejection fraction was in the range of 60%   to 65%. Wall motion was normal; there were no regional wall   motion abnormalities. Doppler parameters are consistent with a   reversible restrictive pattern, indicative of decreased left   ventricular diastolic compliance and/or increased left atrial   pressure (grade 3 diastolic dysfunction). - Aortic valve: There was moderate stenosis. Peak velocity (S): 320   cm/s. Mean gradient (S): 21 mm Hg. Valve area (VTI): 1.38 cm^2.   Valve area (Vmax): 1.47 cm^2. Valve area (Vmean): 1.37 cm^2. - Mitral valve: Moderately calcified annulus. Valve area by   continuity equation (using LVOT flow): 2.13 cm^2. - Tricuspid valve: There was moderate regurgitation. - Pulmonary arteries: Systolic pressure was mildly increased. PA   peak pressure: 35 mm Hg (S).  Patient Profile     46 y.o. male with history of PAD status post left AKA, ESRD on dialysis, diabetes, hypertension, and prior CVA presenting to the hospital with complete heart block.  Assessment & Plan    1.  Complete heart block Plan for pacemaker Monday.  2.  ESRD on HD Nephrology following, potentially dialyze today. Hayden [name] plan for right sided pacemaker per their recommendations.  3.  HTN Blood pressure elevated. AV block possibly contributing. Have added back clinidine patch. Hayden [name] adjust further post pacemaker.  4. Chest pain: Currently unclear as to the cause of his chest pain. Troponin slightly elevated but flat which could be due to renal failure and bradycardia. Per wife, also potentially due to anxiety/depression. Hayden [name] continue to monitor.  For questions or updates, please contact [place] Please consult [email or web address]  for contact info under Cardiology/STEMI.      Signed, Hayden Azucena Meredith Leeds, MD  05/04/2017, 8:36 AM

## 2017-05-04 NOTE — Progress Notes (Signed)
Dialysis treatment completed.  3500 mL ultrafiltrated and net fluid removal 3000 mL.    Patient status unchanged. Lung sounds diminished to ausculation in all fields. Generalized non pitting edema. Cardiac: 1st degree HB.  Disconnected lines and removed needles.  Pressure held for 10 minutes and band aid/gauze dressing applied.  Report given to bedside RN, Delsa Sale.

## 2017-05-05 ENCOUNTER — Other Ambulatory Visit: Payer: Self-pay

## 2017-05-05 ENCOUNTER — Encounter (HOSPITAL_COMMUNITY)
Admission: EM | Disposition: A | Discharge status: Home / Self Care | Payer: Self-pay | Source: Home / Self Care | Attending: Cardiology

## 2017-05-05 HISTORY — PX: PACEMAKER IMPLANT: EP1218

## 2017-05-05 LAB — CBC
HCT: 37.7 % — ABNORMAL LOW (ref 39.0–52.0)
HEMOGLOBIN: 11.9 g/dL — AB (ref 13.0–17.0)
MCH: 25.7 pg — ABNORMAL LOW (ref 26.0–34.0)
MCHC: 31.6 g/dL (ref 30.0–36.0)
MCV: 81.4 fL (ref 78.0–100.0)
PLATELETS: 338 10*3/uL (ref 150–400)
RBC: 4.63 MIL/uL (ref 4.22–5.81)
RDW: 16.9 % — ABNORMAL HIGH (ref 11.5–15.5)
WBC: 10.4 10*3/uL (ref 4.0–10.5)

## 2017-05-05 LAB — BASIC METABOLIC PANEL
ANION GAP: 19 — AB (ref 5–15)
BUN: 64 mg/dL — ABNORMAL HIGH (ref 6–20)
CO2: 21 mmol/L — ABNORMAL LOW (ref 22–32)
CREATININE: 7.1 mg/dL — AB (ref 0.61–1.24)
Calcium: 8.8 mg/dL — ABNORMAL LOW (ref 8.9–10.3)
Chloride: 96 mmol/L — ABNORMAL LOW (ref 101–111)
GFR, EST AFRICAN AMERICAN: 10 mL/min — AB (ref >60)
GFR, EST NON AFRICAN AMERICAN: 8 mL/min — AB (ref >60)
GLUCOSE: 83 mg/dL (ref 65–99)
Potassium: 4.4 mmol/L (ref 3.5–5.1)
Sodium: 136 mmol/L (ref 135–145)

## 2017-05-05 SURGERY — PACEMAKER IMPLANT

## 2017-05-05 MED ORDER — SODIUM CHLORIDE 0.9 % IR SOLN
Status: AC
  Filled 2017-05-05: qty 2

## 2017-05-05 MED ORDER — CEFAZOLIN SODIUM-DEXTROSE 2-4 GM/100ML-% IV SOLN
2.0000 g | Freq: Once | INTRAVENOUS | Status: AC
  Administered 2017-05-06: 2 g via INTRAVENOUS
  Filled 2017-05-05: qty 100

## 2017-05-05 MED ORDER — HYDRALAZINE HCL 20 MG/ML IJ SOLN
INTRAMUSCULAR | Status: AC
  Filled 2017-05-05: qty 1

## 2017-05-05 MED ORDER — SODIUM CHLORIDE 0.9 % IV SOLN: 100.0000 mL | INTRAVENOUS | Status: DC | PRN

## 2017-05-05 MED ORDER — HEPARIN SODIUM (PORCINE) 1000 UNIT/ML DIALYSIS: 1000.0000 [IU] | INTRAMUSCULAR | Status: DC | PRN

## 2017-05-05 MED ORDER — SODIUM CHLORIDE 0.9 % IR SOLN
80.0000 mg | Status: AC
  Administered 2017-05-05: 80 mg

## 2017-05-05 MED ORDER — MIDAZOLAM HCL 5 MG/5ML IJ SOLN
INTRAMUSCULAR | Status: DC | PRN
  Administered 2017-05-05 (×3): 1 mg via INTRAVENOUS

## 2017-05-05 MED ORDER — LIDOCAINE-PRILOCAINE 2.5-2.5 % EX CREA: 1.0000 "application " | TOPICAL_CREAM | CUTANEOUS | Status: DC | PRN

## 2017-05-05 MED ORDER — FENTANYL CITRATE (PF) 100 MCG/2ML IJ SOLN
INTRAMUSCULAR | Status: DC | PRN
  Administered 2017-05-05 (×3): 25 ug via INTRAVENOUS

## 2017-05-05 MED ORDER — SODIUM CHLORIDE 0.9 % IV SOLN: INTRAVENOUS | Status: DC

## 2017-05-05 MED ORDER — CHLORHEXIDINE GLUCONATE 4 % EX LIQD
60.0000 mL | Freq: Once | CUTANEOUS | Status: AC
  Administered 2017-05-05: 4 via TOPICAL
  Filled 2017-05-05: qty 15

## 2017-05-05 MED ORDER — LIDOCAINE HCL (PF) 1 % IJ SOLN
INTRAMUSCULAR | Status: AC
  Filled 2017-05-05: qty 30

## 2017-05-05 MED ORDER — PENTAFLUOROPROP-TETRAFLUOROETH EX AERO: 1.0000 "application " | INHALATION_SPRAY | CUTANEOUS | Status: DC | PRN

## 2017-05-05 MED ORDER — CHLORHEXIDINE GLUCONATE 4 % EX LIQD: 60.0000 mL | Freq: Once | CUTANEOUS | Status: AC

## 2017-05-05 MED ORDER — CEFAZOLIN SODIUM-DEXTROSE 2-4 GM/100ML-% IV SOLN
INTRAVENOUS | Status: AC
  Filled 2017-05-05: qty 100

## 2017-05-05 MED ORDER — MIDAZOLAM HCL 5 MG/5ML IJ SOLN
INTRAMUSCULAR | Status: AC
  Filled 2017-05-05: qty 5

## 2017-05-05 MED ORDER — FENTANYL CITRATE (PF) 100 MCG/2ML IJ SOLN
INTRAMUSCULAR | Status: AC
  Filled 2017-05-05: qty 2

## 2017-05-05 MED ORDER — LIDOCAINE HCL (PF) 1 % IJ SOLN
INTRAMUSCULAR | Status: DC | PRN
  Administered 2017-05-05: 90 mL

## 2017-05-05 MED ORDER — HEPARIN SODIUM (PORCINE) 1000 UNIT/ML DIALYSIS: 20.0000 [IU]/kg | INTRAMUSCULAR | Status: DC | PRN

## 2017-05-05 MED ORDER — HYDRALAZINE HCL 20 MG/ML IJ SOLN
INTRAMUSCULAR | Status: DC | PRN
  Administered 2017-05-05: 5 mg via INTRAVENOUS
  Administered 2017-05-05: 10 mg via INTRAVENOUS

## 2017-05-05 MED ORDER — LIDOCAINE HCL (PF) 1 % IJ SOLN: 5.0000 mL | INTRAMUSCULAR | Status: DC | PRN

## 2017-05-05 MED ORDER — HEPARIN (PORCINE) IN NACL 2-0.9 UNIT/ML-% IJ SOLN
INTRAMUSCULAR | Status: AC
  Filled 2017-05-05: qty 500

## 2017-05-05 MED ORDER — ALTEPLASE 2 MG IJ SOLR: 2.0000 mg | Freq: Once | INTRAMUSCULAR | Status: DC | PRN

## 2017-05-05 MED ORDER — ONDANSETRON HCL 4 MG/2ML IJ SOLN: 4.0000 mg | Freq: Four times a day (QID) | INTRAMUSCULAR | Status: DC | PRN

## 2017-05-05 MED ORDER — HYDRALAZINE HCL 20 MG/ML IJ SOLN
10.0000 mg | Freq: Once | INTRAMUSCULAR | Status: AC
  Administered 2017-05-05: 10 mg via INTRAVENOUS

## 2017-05-05 MED ORDER — HEPARIN (PORCINE) IN NACL 2-0.9 UNIT/ML-% IJ SOLN
INTRAMUSCULAR | Status: AC | PRN
  Administered 2017-05-05: 500 mL

## 2017-05-05 MED ORDER — CEFAZOLIN SODIUM-DEXTROSE 2-4 GM/100ML-% IV SOLN
2.0000 g | INTRAVENOUS | Status: AC
  Administered 2017-05-05: 2 g via INTRAVENOUS

## 2017-05-05 MED ORDER — ORAL CARE MOUTH RINSE
15.0000 mL | Freq: Two times a day (BID) | OROMUCOSAL | Status: DC
  Administered 2017-05-05 – 2017-05-06 (×4): 15 mL via OROMUCOSAL

## 2017-05-05 MED ORDER — LIDOCAINE-PRILOCAINE 2.5-2.5 % EX CREA
1.0000 "application " | TOPICAL_CREAM | CUTANEOUS | Status: DC | PRN
  Filled 2017-05-05: qty 5

## 2017-05-05 MED ORDER — CARVEDILOL 12.5 MG PO TABS
12.5000 mg | ORAL_TABLET | Freq: Two times a day (BID) | ORAL | Status: DC
  Administered 2017-05-06: 12.5 mg via ORAL
  Filled 2017-05-05: qty 1

## 2017-05-05 SURGICAL SUPPLY — 7 items
CABLE SURGICAL S-101-97-12 (CABLE) ×2 IMPLANT
LEAD TENDRIL MRI 52CM LPA1200M (Lead) ×2 IMPLANT
LEAD TENDRIL MRI 58CM LPA1200M (Lead) ×2 IMPLANT
PACEMAKER ASSURITY DR-RF (Pacemaker) ×2 IMPLANT
PAD DEFIB LIFELINK (PAD) ×2 IMPLANT
SHEATH CLASSIC 8F (SHEATH) ×4 IMPLANT
TRAY PACEMAKER INSERTION (PACKS) ×2 IMPLANT

## 2017-05-05 NOTE — Progress Notes (Signed)
Progress Note  Patient Name: Hayden [name] Date of Encounter: 05/05/2017  Primary Cardiologist: Dr. Ellyn Hack  Subjective   C/w some generalized chest discomfort, no SOB  Inpatient Medications    Scheduled Meds: . ALPRAZolam  1 mg Oral TID  . amLODipine  10 mg Oral Daily  . aspirin EC  81 mg Oral Daily  . cloNIDine  0.2 mg Transdermal Q Fri-1800  . divalproex  500 mg Oral Q12H  . escitalopram  10 mg Oral Daily  . feeding supplement (NEPRO CARB STEADY)  237 mL Oral BID BM  . ferric citrate  630 mg Oral TID WC  . furosemide  80 mg Oral BID  . gabapentin  100 mg Oral TID  . heparin  5,000 Units Subcutaneous Q8H  . hydrALAZINE  10 mg Intravenous Once  . hydrALAZINE  75 mg Oral Q8H  . isosorbide dinitrate  2.5 mg Oral BID  . mouth rinse  15 mL Mouth Rinse BID  . multivitamin  1 tablet Oral QHS  . nicotine  21 mg Transdermal Daily  . pravastatin  20 mg Oral Daily  . sodium chloride flush  3 mL Intravenous Q12H   Continuous Infusions: . sodium chloride 250 mL (05/03/17 2105)   PRN Meds: sodium chloride, acetaminophen, loperamide, nitroGLYCERIN, ondansetron (ZOFRAN) IV, sodium chloride flush, traMADol   Vital Signs    Vitals:   05/05/17 0500 05/05/17 0515 05/05/17 0530 05/05/17 0545  BP: (!) 198/73  (!) 202/57   Pulse: (!) 45 (!) 48 (!) 47 (!) 48  Resp: (!) 24 (!) 21 (!) 21 (!) 22  Temp:      TempSrc:      SpO2: (!) 89% 93% 96% 97%  Weight:      Height:        Intake/Output Summary (Last 24 hours) at 05/05/2017 0717 Last data filed at 05/04/2017 2334 Gross per 24 hour  Intake 780 ml  Output 3001 ml  Net -2221 ml   Filed Weights   05/04/17 0600 05/04/17 1936 05/04/17 2330  Weight: 204 lb 12.9 oz (92.9 kg) 207 lb 10.8 oz (94.2 kg) 201 lb 1 oz (91.2 kg)    Telemetry    CHB 40's - Personally Reviewed  ECG    No new EKGs - Personally Reviewed  Physical Exam   GEN: No acute distress, OOB to chair this AM, more interactive today Neck: No JVD Cardiac:  RRR, bradycardic, no murmurs, rubs, or gallops.  Respiratory: CTA b/l. GI: Soft, nontender, non-distended  MS: No edema; s/p R AKA Neuro:  Nonfocal Psych: Flat affect, defers to his wife routinely  Labs    Chemistry Recent Labs  Lab 05/02/17 1342 05/02/17 1349 05/03/17 0556 05/04/17 2130  NA 134* 134* 135 134*  K 5.1 4.9 4.9 5.8*  CL 97* 101 98* 98*  CO2 21*  --  18* 16*  GLUCOSE 138* 136* 97 126*  BUN 104* 98* 118* 149*  CREATININE 9.17* 9.00* 9.67* 11.82*  CALCIUM 8.4*  --  7.9* 7.8*  PROT 7.0  --   --   --   ALBUMIN 3.1*  --   --  3.1*  AST 25  --   --   --   ALT 33  --   --   --   ALKPHOS 217*  --   --   --   BILITOT 0.8  --   --   --   GFRNONAA 6*  --  6* 5*  GFRAA 7*  --  7* 5*  ANIONGAP 16*  --  19* 20*     Hematology Recent Labs  Lab 05/02/17 1342 05/02/17 1349 05/03/17 0556  WBC 10.0  --  9.7  RBC 4.63  --  4.43  HGB 12.1* 13.6 11.5*  HCT 38.2* 40.0 36.3*  MCV 82.5  --  81.9  MCH 26.1  --  26.0  MCHC 31.7  --  31.7  RDW 16.4*  --  16.3*  PLT 421*  --  420*    Cardiac Enzymes Recent Labs  Lab 05/03/17 1536 05/03/17 2055  TROPONINI 0.20* 0.18*    Recent Labs  Lab 05/02/17 1353  TROPIPOC 0.11*     BNPNo results for input(s): BNP, PROBNP in the last 168 hours.   DDimer No results for input(s): DDIMER in the last 168 hours.   Radiology    No results found.  Cardiac Studies   TTE 02/15/17 - Left ventricle: The cavity size was normal. Systolic function was normal. The estimated ejection fraction was in the range of 60% to 65%. Wall motion was normal; there were no regional wall motion abnormalities. Doppler parameters are consistent with a reversible restrictive pattern, indicative of decreased left ventricular diastolic compliance and/or increased left atrial pressure (grade 3 diastolic dysfunction). - Aortic valve: There was moderate stenosis. Peak velocity (S): 320 cm/s. Mean gradient (S): 21 mm Hg. Valve area  (VTI): 1.38 cm^2. Valve area (Vmax): 1.47 cm^2. Valve area (Vmean): 1.37 cm^2. - Mitral valve: Moderately calcified annulus. Valve area by continuity equation (using LVOT flow): 2.13 cm^2. - Tricuspid valve: There was moderate regurgitation. - Pulmonary arteries: Systolic pressure was mildly increased. PA peak pressure: 35 mm Hg (S).    Patient Profile     46 y.o. male with history of PAD status post left AKA, ESRD on dialysis, diabetes, hypertension, and prior CVA presenting to the hospital with complete heart block    Assessment & Plan    1. CHB     Planned for PPM today     Patient is seen/examined today by Dr. Curt Bears     Wife at bedside     No new questions from patient or wife, continue to want to proceed     Update labs today  2. HTN ooc     catapress was resumed not felt to be contributing to CHB      Pending AM meds, s/p hydralzine, follow likely to improve some post pacing and can add BB as well afterwards  3. ESRF on HD     Dialysis yesterday 3L removed     C/w nephrology  4. CP     Not felt ACS, trop flat and likely 2/2 ESRF   5. Anxiety/depress     Mood is better, my 1st day with him, Dr. Curt Bears at bedside felt continued to improve    For questions or updates, please contact [place] Please consult [email or web address] for contact info under Cardiology/STEMI.      Signed, Baldwin Jamaica, PA-C  05/05/2017, 7:17 AM    I have seen and examined this patient with Tommye Standard.  Agree with above, note added to reflect my findings.  On exam, bradycardic, no murmurs, lungs clear. Now in 2:1 AV block. Plan for pacemaker today. Risks and benefits discussed. Risks include but not limited to Tamponade, pneumothorax, bleeding and infection. Patient understands the risks and has agreed to the procedure.  His chest pain appears to be anxiety driven with negative troponin, if it continues post  pacer, would benefit from stress testing.  [place] Oborn M. Camille Dragan  MD 05/05/2017 7:37 AM

## 2017-05-05 NOTE — Progress Notes (Signed)
PHARMACY NOTE:  ANTIMICROBIAL RENAL DOSAGE ADJUSTMENT  Current antimicrobial regimen includes a mismatch between antimicrobial dosage and estimated renal function.  As per policy approved by the Pharmacy & Therapeutics and Medical Executive Committees, the antimicrobial dosage will be adjusted accordingly.  Current antimicrobial dosage:  Ancef 1gm IV Q6H x 3 doses  Indication:  Surgical prophylaxis  Renal Function:  Estimated Creatinine Clearance: 14.7 mL/min (A) (by C-G formula based on SCr of 7.1 mg/dL (H)). [x]      On intermittent HD, scheduled: HD tonight per RN []      On CRRT    Antimicrobial dosage has been changed to:  Ancef 2gm IV x 1 after HD  Additional comments:  Giuliana Handyside D. Mina Marble, PharmD, BCPS Pager:  [phone] 05/05/2017, 7:59 PM

## 2017-05-05 NOTE — Progress Notes (Signed)
Dr. Teena Dunk notified of pt's BP.  Orders received to give scheduled po BP meds at this time.  Will continue to closely monitor.

## 2017-05-05 NOTE — Progress Notes (Signed)
Patient ID: Hayden [name], male   DOB: 30-May-1971, 46 y.o.   MRN: [id number]   [facility] Progress Note   Assessment/ Plan:   1. Bradycardia/Complete heart block - he is scheduled to undergo pacemaker placement today-recommended for this to be done on the right side with a left radiocephalic fistula in place to limit risk of venous hypertension/left arm swelling from central vein stenosis from indwelling leads. 2. ESRD - usually on a Monday/Wednesday/Friday schedule and underwent hemodialysis yesterday with the go for a truncated dialysis treatment today after placement of pacemaker..   3. HTN/volume -  systolic blood pressure significantly elevated this morning-possibly a prominent anxiety component, on amlodipine, clonidine, hydralazine and furosemide. 4. Anemia - hemoglobin currently 11.9, not on ESA 5. Metabolic bone disease - phosphorus level significantly elevated, on ferric citrate 3 times a day before meals. Not on vitamin D receptor analogues/Citracal citrate with recent PTH of 110. 6. Nutrition - Renal diet/vitamins 7. Diabetes mellitus: Ongoing management/control per primary service  Subjective:   Expresses apprehension about his upcoming procedure, denies any chest pain. Troubled by fluctuations of his estimated dry weight.    Objective:   BP (!) 202/57   Pulse (!) 48   Temp 98.7 F (37.1 C) (Oral)   Resp (!) 22   Ht 5\' 9"  (1.753 m)   Wt 91.2 kg (201 lb 1 oz)   SpO2 97%   BMI 29.69 kg/m   Physical Exam: Gen: Comfortably resting in bed, wife at bedside CVS: Pulse regular bradycardia, normal S1 and S2 Resp: Anteriorly clear to auscultation, no rales/rhonchi Abd: Soft, obese, nontender Ext: Status post right below knee amputation, no left lower extremity edema. Left radiocephalic fistula.  Labs: BMET Recent Labs  Lab 05/02/17 1342 05/02/17 1349 05/03/17 0556 05/04/17 2130 05/05/17 0745  NA 134* 134* 135 134* 136  K 5.1 4.9 4.9 5.8* 4.4   CL 97* 101 98* 98* 96*  CO2 21*  --  18* 16* 21*  GLUCOSE 138* 136* 97 126* 83  BUN 104* 98* 118* 149* 64*  CREATININE 9.17* 9.00* 9.67* 11.82* 7.10*  CALCIUM 8.4*  --  7.9* 7.8* 8.8*  PHOS  --   --   --  15.0*  --    CBC Recent Labs  Lab 05/02/17 1342 05/02/17 1349 05/03/17 0556 05/05/17 0745  WBC 10.0  --  9.7 10.4  NEUTROABS 7.3  --   --   --   HGB 12.1* 13.6 11.5* 11.9*  HCT 38.2* 40.0 36.3* 37.7*  MCV 82.5  --  81.9 81.4  PLT 421*  --  420* 338   Medications:    . ALPRAZolam  1 mg Oral TID  . amLODipine  10 mg Oral Daily  . aspirin EC  81 mg Oral Daily  . cloNIDine  0.2 mg Transdermal Q Fri-1800  . divalproex  500 mg Oral Q12H  . escitalopram  10 mg Oral Daily  . feeding supplement (NEPRO CARB STEADY)  237 mL Oral BID BM  . ferric citrate  630 mg Oral TID WC  . furosemide  80 mg Oral BID  . gabapentin  100 mg Oral TID  . heparin  5,000 Units Subcutaneous Q8H  . hydrALAZINE  10 mg Intravenous Once  . hydrALAZINE  75 mg Oral Q8H  . isosorbide dinitrate  2.5 mg Oral BID  . mouth rinse  15 mL Mouth Rinse BID  . multivitamin  1 tablet Oral QHS  . nicotine  21 mg Transdermal  Daily  . pravastatin  20 mg Oral Daily  . sodium chloride flush  3 mL Intravenous Q12H   Elmarie Shiley, MD 05/05/2017, 9:43 AM

## 2017-05-06 ENCOUNTER — Encounter (HOSPITAL_COMMUNITY): Payer: Self-pay | Admitting: Cardiology

## 2017-05-06 ENCOUNTER — Inpatient Hospital Stay (HOSPITAL_COMMUNITY): Payer: Medicare Other

## 2017-05-06 DIAGNOSIS — I4892 Unspecified atrial flutter: Secondary | ICD-10-CM

## 2017-05-06 DIAGNOSIS — I10 Essential (primary) hypertension: Secondary | ICD-10-CM

## 2017-05-06 LAB — PROTIME-INR
INR: 1.27
Prothrombin Time: 15.8 seconds — ABNORMAL HIGH (ref 11.4–15.2)

## 2017-05-06 MED ORDER — IRBESARTAN 300 MG PO TABS
300.0000 mg | ORAL_TABLET | Freq: Every day | ORAL | Status: DC
  Administered 2017-05-06: 300 mg via ORAL
  Filled 2017-05-06: qty 1

## 2017-05-06 MED ORDER — WARFARIN - PHARMACIST DOSING INPATIENT: Freq: Every day | Status: DC

## 2017-05-06 MED ORDER — HYDRALAZINE HCL 50 MG PO TABS
100.0000 mg | ORAL_TABLET | Freq: Three times a day (TID) | ORAL | Status: DC
  Administered 2017-05-06: 100 mg via ORAL
  Filled 2017-05-06: qty 2

## 2017-05-06 MED ORDER — WARFARIN SODIUM 5 MG PO TABS
5.0000 mg | ORAL_TABLET | Freq: Once | ORAL | Status: AC
  Administered 2017-05-06: 5 mg via ORAL
  Filled 2017-05-06: qty 1

## 2017-05-06 MED ORDER — CARVEDILOL 12.5 MG PO TABS: 12.5000 mg | ORAL_TABLET | Freq: Two times a day (BID) | ORAL | 4 refills | Status: DC

## 2017-05-06 MED ORDER — IRBESARTAN 300 MG PO TABS: 300.0000 mg | ORAL_TABLET | Freq: Every day | ORAL | 4 refills | Status: DC

## 2017-05-06 MED ORDER — COUMADIN BOOK
Freq: Once | Status: AC
  Administered 2017-05-06: 11:00:00
  Filled 2017-05-06: qty 1

## 2017-05-06 MED ORDER — WARFARIN VIDEO
Freq: Once | Status: AC
  Administered 2017-05-06: 11:00:00

## 2017-05-06 MED ORDER — WARFARIN SODIUM 7.5 MG PO TABS: 7.5000 mg | ORAL_TABLET | Freq: Once | ORAL | Status: DC

## 2017-05-06 NOTE — Discharge Instructions (Signed)
Supplemental Discharge Instructions for  Pacemaker/Defibrillator Patients  Activity No heavy lifting or vigorous activity with your left/right arm for 6 to 8 weeks.  Do not raise your left/right arm above your head for one week.  Gradually raise your affected arm as drawn below.              05/09/17                    05/10/17                  05/11/17                 05/12/17 __  NO DRIVING patient does not drive  WOUND CARE - Keep the wound area clean and dry.  Do not get this area wet, no showers until cleared to at your wound check visit . - The tape/steri-strips on your wound will fall off; do not pull them off.  No bandage is needed on the site.  DO  NOT apply any creams, oils, or ointments to the wound area. - If you notice any drainage or discharge from the wound, any swelling or bruising at the site, or you develop a fever > 101? F after you are discharged home, call the office at once.  Special Instructions - You are still able to use cellular telephones; use the ear opposite the side where you have your pacemaker/defibrillator.  Avoid carrying your cellular phone near your device. - When traveling through airports, show security personnel your identification card to avoid being screened in the metal detectors.  Ask the security personnel to use the hand wand. - Avoid arc welding equipment, MRI testing (magnetic resonance imaging), TENS units (transcutaneous nerve stimulators).  Call the office for questions about other devices. - Avoid electrical appliances that are in poor condition or are not properly grounded. - Microwave ovens are safe to be near or to operate.  Additional information for defibrillator patients should your device go off: - If your device goes off ONCE and you feel fine afterward, notify the device clinic nurses. - If your device goes off ONCE and you do not feel well afterward, call 911. - If your device goes off TWICE, call 911. - If your device goes  off THREE times in one day, call 911.  DO NOT DRIVE YOURSELF OR A FAMILY MEMBER WITH A DEFIBRILLATOR TO THE HOSPITAL--CALL 911.  Information on my medicine - Coumadin   (Warfarin)  This medication education was reviewed with me or my healthcare representative as part of my discharge preparation.  The pharmacist that spoke with me during my hospital stay was:  Georgina Peer, [facility]  Why was Coumadin prescribed for you? Coumadin was prescribed for you because you have a blood clot or a medical condition that can cause an increased risk of forming blood clots. Blood clots can cause serious health problems by blocking the flow of blood to the heart, lung, or brain. Coumadin can prevent harmful blood clots from forming. As a reminder your indication for Coumadin is:   Stroke Prevention Because Of Atrial Fibrillation  What test will check on my response to Coumadin? While on Coumadin (warfarin) you will need to have an INR test regularly to ensure that your dose is keeping you in the desired range. The INR (international normalized ratio) number is calculated from the result of the laboratory test called prothrombin time (PT).  If an INR APPOINTMENT HAS NOT  ALREADY BEEN MADE FOR YOU please schedule an appointment to have this lab work done by your health care provider within 7 days. Your INR goal is usually a number between:  2 to 3 or your provider may give you a more narrow range like 2-2.5.  Ask your health care provider during an office visit what your goal INR is.  What  do you need to  know  About  COUMADIN? Take Coumadin (warfarin) exactly as prescribed by your healthcare provider about the same time each day.  DO NOT stop taking without talking to the doctor who prescribed the medication.  Stopping without other blood clot prevention medication to take the place of Coumadin may increase your risk of developing a new clot or stroke.  Get refills before you run out.  What do you do if you  miss a dose? If you miss a dose, take it as soon as you remember on the same day then continue your regularly scheduled regimen the next day.  Do not take two doses of Coumadin at the same time.  Important Safety Information A possible side effect of Coumadin (Warfarin) is an increased risk of bleeding. You should call your healthcare provider right away if you experience any of the following: ? Bleeding from an injury or your nose that does not stop. ? Unusual colored urine (red or dark brown) or unusual colored stools (red or black). ? Unusual bruising for unknown reasons. ? A serious fall or if you hit your head (even if there is no bleeding).  Some foods or medicines interact with Coumadin (warfarin) and might alter your response to warfarin. To help avoid this: ? Eat a balanced diet, maintaining a consistent amount of Vitamin K. ? Notify your provider about major diet changes you plan to make. ? Avoid alcohol or limit your intake to 1 drink for women and 2 drinks for men per day. (1 drink is 5 oz. wine, 12 oz. beer, or 1.5 oz. liquor.)  Make sure that ANY health care provider who prescribes medication for you knows that you are taking Coumadin (warfarin).  Also make sure the healthcare provider who is monitoring your Coumadin knows when you have started a new medication including herbals and non-prescription products.  Coumadin (Warfarin)  Major Drug Interactions  Increased Warfarin Effect Decreased Warfarin Effect  Alcohol (large quantities) Antibiotics (esp. Septra/Bactrim, Flagyl, Cipro) Amiodarone (Cordarone) Aspirin (ASA) Cimetidine (Tagamet) Megestrol (Megace) NSAIDs (ibuprofen, naproxen, etc.) Piroxicam (Feldene) Propafenone (Rythmol SR) Propranolol (Inderal) Isoniazid (INH) Posaconazole (Noxafil) Barbiturates (Phenobarbital) Carbamazepine (Tegretol) Chlordiazepoxide (Librium) Cholestyramine (Questran) Griseofulvin Oral Contraceptives Rifampin Sucralfate  (Carafate) Vitamin K   Coumadin (Warfarin) Major Herbal Interactions  Increased Warfarin Effect Decreased Warfarin Effect  Garlic Ginseng Ginkgo biloba Coenzyme Q10 Green tea St. Johns wort    Coumadin (Warfarin) FOOD Interactions  Eat a consistent number of servings per week of foods HIGH in Vitamin K (1 serving =  cup)  Collards (cooked, or boiled & drained) Kale (cooked, or boiled & drained) Mustard greens (cooked, or boiled & drained) Parsley *serving size only =  cup Spinach (cooked, or boiled & drained) Swiss chard (cooked, or boiled & drained) Turnip greens (cooked, or boiled & drained)  Eat a consistent number of servings per week of foods MEDIUM-HIGH in Vitamin K (1 serving = 1 cup)  Asparagus (cooked, or boiled & drained) Broccoli (cooked, boiled & drained, or raw & chopped) Brussel sprouts (cooked, or boiled & drained) *serving size only =  cup Lettuce,  raw (green leaf, endive, romaine) Spinach, raw Turnip greens, raw & chopped   These websites have more information on Coumadin (warfarin):  FailFactory.se; VeganReport.com.au;

## 2017-05-06 NOTE — Progress Notes (Signed)
Patient d/c home per MD order. Patient is alert and oriented, in good spirit with stable vital signs. Left with wife and another family member. Patient was wheel-chaired out. IV taken out.

## 2017-05-06 NOTE — Progress Notes (Signed)
HD tx completed @ 0315 w/o problem, UF goal met, blood rinsed back, VSS, report given to Dion Saucier, RN

## 2017-05-06 NOTE — Discharge Summary (Signed)
ELECTROPHYSIOLOGY PROCEDURE DISCHARGE SUMMARY    Patient ID: Hayden [name],  MRN: [id number], DOB/AGE: January 29, 1971 46 y.o.  Admit date: 05/02/2017 Discharge date: 05/06/2017  Primary Care Physician: [name], DO  Primary Cardiologist: Dr. Ellyn Hack Electrophysiologist: New to dr. Curt Bears this [facility]  Primary Discharge Diagnosis:  1. CHB 2. New PAFlutter     CHA2DS2Vasc is at least 5, started on warfarin  Secondary Discharge Diagnosis:  1. PVD Hx R AKA 2. ESRF on HD     [name]-F, established at [facility] 3. HTN 4. DM 5. CVA (old)  Allergies  Allergen Reactions  . Dilaudid [Hydromorphone Hcl] Other (See Comments)    ABNORMAL BEHAVIORS "VERBALLY AND PHYSICALLY ABUSIVE" PSYCHOSIS  . Morphine And Related Itching and Other (See Comments)  . Adhesive [Tape] Other (See Comments)    Redness from adhesive tape if left on too long, paper tape is preferred     Procedures This Admission:  1.  Implantation of a SJM dualchamber PPM on 05/05/18 by Dr Curt Bears.  The patient received a Huxley 1200M (serial number  [id number]) right atrial lead and a St Jude Medical model [id number] (serial number  [id number]) right ventricular lead St Jude Medical Assurity MRI  model [id number] (serial number  [id number] ) pacemaker There were no immediate post procedure complications. 2.  CXR on 05/06/17  demonstrated no pneumothorax status post device implantation.   Brief HPI: Hayden [name] is a 46 y.o. male with PMHx of PAD s/p left AKA, ESRD on HD, HTN, DM, prior CVA  was initially found to have bradycardia at dialysis and his Labetalol was discontinued. He was seen by Dr Ellyn Hack the day of admission where he was found to be in complete heart block. He was relatively asymptomatic but QRS with ventricular escape is different from baseline and there was concern about decompensation. He is also on Clonidine, admitted to ICU for monitoring during washout, and potential  need for [facility]     Hospital Course:  The patient was admitted, nephrology was consulted for his ESRF and HD management.  Echo done in 02/15/17 noted LVEF 60-65%, mod AS.  His BP was significantly elevated and difficult to control, he had nonspecific generalized c/o CP with flat troponins, not felt to be ACS and abn Trop were contributed to his ESRF.  Over the weekend he was transferred to ICU 2/2 with vague chest complaints, increased weakness and some degree of lethargy, as well as anxiety.  He remained in CHB and given difficulty with BP his clonidine patch was resumed.  He underwent PPM implant yesterday with details as outlined above.   Carvedilol was added to his regime post pacer. He was monitored on telemetry overnight which demonstrated SR/ST w/Vpacing, he developed AFlutter at 0300 and was pace terminated via his PPM by Dr. Curt Bears.  Right chest was without hematoma or ecchymosis.  The device was interrogated and found to be functioning normally.  CXR was obtained and demonstrated no pneumothorax status post device implantation.  Wound care, arm mobility, and restrictions were reviewed with the patient.  The patient was examined by Dr. Curt Bears and planned for discharge if BP improved and considered stable for discharge to home.   Telemetry is SR 80's-90s and BP has significantly improved.  The patient is being started on Coumadin 5mg  daily, given some element of chest discomfort (though none since pacer implant) and HTN a TOC visit for 2 days form now has been arranged  and to ensure coumadin clinic follow up as well.  The patient Hayden [name] call his dialysis center today and make sure he resumes his usual HD schedule tomorrow as we discussed.   Physical Exam: Vitals:   05/06/17 1015 05/06/17 1030 05/06/17 1045 05/06/17 1100  BP: 140/67 (!) 152/86 (!) 138/113 (!) 156/77  Pulse: 96 96 96 94  Resp: 20 19 19 19   Temp:    99.7 F (37.6 C)  TempSrc:    Oral  SpO2: 93% 94% 99% 96%  Weight:        Height:         Labs:   Lab Results  Component Value Date   WBC 10.4 05/05/2017   HGB 11.9 (L) 05/05/2017   HCT 37.7 (L) 05/05/2017   MCV 81.4 05/05/2017   PLT 338 05/05/2017    Recent Labs  Lab 05/02/17 1342  05/05/17 0745  NA 134*   < > 136  K 5.1   < > 4.4  CL 97*   < > 96*  CO2 21*   < > 21*  BUN 104*   < > 64*  CREATININE 9.17*   < > 7.10*  CALCIUM 8.4*   < > 8.8*  PROT 7.0  --   --   BILITOT 0.8  --   --   ALKPHOS 217*  --   --   ALT 33  --   --   AST 25  --   --   GLUCOSE 138*   < > 83   < > = values in this interval not displayed.    Discharge Medications:  Allergies as of 05/06/2017      Reactions   Dilaudid [hydromorphone Hcl] Other (See Comments)   ABNORMAL BEHAVIORS "VERBALLY AND PHYSICALLY ABUSIVE" PSYCHOSIS   Morphine And Related Itching, Other (See Comments)   Adhesive [tape] Other (See Comments)   Redness from adhesive tape if left on too long, paper tape is preferred      Medication List    STOP taking these medications   aspirin EC 81 MG tablet     TAKE these medications   ALPRAZolam 1 MG tablet Commonly known as:  XANAX Take 1 mg by mouth 3 (three) times daily as needed for anxiety or sleep.   amLODipine 10 MG tablet Commonly known as:  NORVASC Take 1 tablet (10 mg total) by mouth daily.   carvedilol 12.5 MG tablet Commonly known as:  COREG Take 1 tablet (12.5 mg total) 2 (two) times daily with a meal by mouth.   cinacalcet 60 MG tablet Commonly known as:  SENSIPAR Take 1 tablet (60 mg total) by mouth daily.   cloNIDine 0.2 mg/24hr patch Commonly known as:  CATAPRES - Dosed in mg/24 hr Place 1 patch (0.2 mg total) onto the skin once a week. What changed:  when to take this   divalproex 125 MG capsule Commonly known as:  DEPAKOTE SPRINKLE Take 4 capsules (500 mg total) by mouth every 12 (twelve) hours.   escitalopram 10 MG tablet Commonly known as:  LEXAPRO Take by mouth.   ferric citrate 1 GM 210 MG(Fe)  tablet Commonly known as:  AURYXIA Take 210 mg by mouth 3 (three) times daily. What changed:  Another medication with the same name was changed. Make sure you understand how and when to take each.   ferric citrate 1 GM 210 MG(Fe) tablet Commonly known as:  AURYXIA Take 2 tablets (420 mg total) by mouth 3 (three)  times daily with meals. What changed:  how much to take   furosemide 80 MG tablet Commonly known as:  LASIX Take 80 mg by mouth 2 (two) times daily.   gabapentin 100 MG capsule Commonly known as:  NEURONTIN Take 1 capsule (100 mg total) by mouth 3 (three) times daily. What changed:  when to take this   hydrALAZINE 100 MG tablet Commonly known as:  APRESOLINE Take 1 tablet (100 mg total) by mouth every 8 (eight) hours.   irbesartan 300 MG tablet Commonly known as:  AVAPRO Take 1 tablet (300 mg total) daily by mouth. Start taking on:  05/07/2017   isosorbide dinitrate 5 MG tablet Commonly known as:  ISORDIL Take 1 tablet (5 mg total) by mouth 2 (two) times daily. What changed:  how much to take   multivitamin Tabs tablet Take 1 tablet by mouth at bedtime.   omeprazole 40 MG capsule Commonly known as:  PRILOSEC Take 1 capsule (40 mg total) by mouth daily.   pravastatin 20 MG tablet Commonly known as:  PRAVACHOL Take 1 tablet (20 mg total) by mouth daily.       Disposition:  Home  Discharge Instructions    Diet - low sodium heart healthy   Complete by:  As directed    Increase activity slowly   Complete by:  As directed      Follow-up Information    [facility] [place] [facility] [employer]. Go on 05/20/2017.   Specialty:  Cardiology Why:  2:00PM, wound check visit Contact information: [address], [address]       Constance Haw, MD Follow up on 08/06/2017.   Specialty:  Cardiology Why:  9:00AM Contact information: [address] [place] [address] [place],  [facility] Follow up on 05/06/2017.   Why:  Please call to resume/make appointment for your usual dialysis schedule Contact information: [place] [address] [phone]        Almyra Deforest, [place] Follow up on 05/09/2017.   Specialties:  Cardiology, Radiology Why:  10:00AM Contact information: [address] [place] [address] [place] Follow up on 05/09/2017.   Specialty:  Cardiology Why:  9:00AM, coumadin check/lab draw Contact information: [place] [phone]          Duration of Discharge Encounter: Greater than 30 minutes including physician time.  Signed, Tommye Standard, PA-C 05/06/2017 12:50 PM  I have seen and examined this patient with Tommye Standard.  Agree with above, note added to reflect my findings.  On exam, RRR, no murmurs, lungs clear. Patient presented to the hospital and complete heart block. He was watched over the weekend as clonidine washout of his system. Did have remaining complete heart block and had a St. Jude dual-chamber pacemaker implanted. Device function was appropriate with chest x-ray showing no major abnormality. He was discharged home. While in the hospital, his blood pressure was quite elevated at times 260/50. His medications were adjusted and his blood pressure was better controlled at discharge. He Deago Burruss follow-up with cardiology nurse practitioners this week for further recommendations on blood pressure management. Of note his blood pressure goes up significantly after dialysis. He says that this is due to dialyzing off blood pressure medications.  Keala Drum M. Destenee Guerry MD 05/06/2017 2:10 PM

## 2017-05-06 NOTE — Progress Notes (Addendum)
Patient ID: Hayden [name], male   DOB: 09/04/1970, 46 y.o.   MRN: [id number]  [facility] Progress Note   Assessment/ Plan:   1. Bradycardia/Complete heart block - status post right sided permanent pacemaker placement with some postoperative pain as expected. 2. ESRD - usually on a Monday/Wednesday/Friday schedule and underwent hemodialysis yesterday /early this morning without problems. We'll plan for his next hemodialysis treatment again tomorrow anticipating that he may still be in the hospital.   3. HTN/volume -  blood pressures remain significantly elevated on amlodipine, clonidine, hydralazine and furosemide. Begin olmesartan and uptitrate hydralazine to 100 mg 3 times a day. 4. Anemia - hemoglobin currently 11.9, not on ESA 5. Metabolic bone disease - phosphorus level significantly elevated, on ferric citrate 3 times a day before meals. Not on vitamin D receptor analogues/Citracal citrate with recent PTH of 110. 6. Nutrition - Renal diet/vitamins 7. Diabetes mellitus: Ongoing management/control per primary service  Subjective:   Complains of some pain and discomfort over right upper chest/PPM implantation site    Objective:   BP (!) 226/185 (BP Location: Left Leg)   Pulse (!) 110   Temp 98.2 F (36.8 C) (Oral)   Resp 20   Ht 5\' 9"  (1.753 m)   Wt 88.5 kg (195 lb 1.7 oz)   SpO2 (!) 88%   BMI 28.81 kg/m   Physical Exam: Gen: Appears to be somewhat uncomfortable resting in bed CVS: Pulse regular rhythm, mild tachycardia 106, 3/6 HSM heard over apex Resp: Anteriorly clear to auscultation, no rales/rhonchi Abd: Soft, obese, nontender Ext: Status post right above-knee amputation, trace left lower extremity edema. Left radiocephalic fistula.  Labs: BMET Recent Labs  Lab 05/02/17 1342 05/02/17 1349 05/03/17 0556 05/04/17 2130 05/05/17 0745  NA 134* 134* 135 134* 136  K 5.1 4.9 4.9 5.8* 4.4  CL 97* 101 98* 98* 96*  CO2 21*  --  18* 16* 21*   GLUCOSE 138* 136* 97 126* 83  BUN 104* 98* 118* 149* 64*  CREATININE 9.17* 9.00* 9.67* 11.82* 7.10*  CALCIUM 8.4*  --  7.9* 7.8* 8.8*  PHOS  --   --   --  15.0*  --    CBC Recent Labs  Lab 05/02/17 1342 05/02/17 1349 05/03/17 0556 05/05/17 0745  WBC 10.0  --  9.7 10.4  NEUTROABS 7.3  --   --   --   HGB 12.1* 13.6 11.5* 11.9*  HCT 38.2* 40.0 36.3* 37.7*  MCV 82.5  --  81.9 81.4  PLT 421*  --  420* 338   Medications:    . ALPRAZolam  1 mg Oral TID  . amLODipine  10 mg Oral Daily  . carvedilol  12.5 mg Oral BID WC  . cloNIDine  0.2 mg Transdermal Q Fri-1800  . coumadin book   Does not apply Once  . divalproex  500 mg Oral Q12H  . escitalopram  10 mg Oral Daily  . feeding supplement (NEPRO CARB STEADY)  237 mL Oral BID BM  . ferric citrate  630 mg Oral TID WC  . furosemide  80 mg Oral BID  . gabapentin  100 mg Oral TID  . hydrALAZINE  10 mg Intravenous Once  . hydrALAZINE  75 mg Oral Q8H  . isosorbide dinitrate  2.5 mg Oral BID  . mouth rinse  15 mL Mouth Rinse BID  . multivitamin  1 tablet Oral QHS  . nicotine  21 mg Transdermal Daily  . pravastatin  20  mg Oral Daily  . sodium chloride flush  3 mL Intravenous Q12H  . warfarin  7.5 mg Oral ONCE-1800  . warfarin   Does not apply Once  . Warfarin - Pharmacist Dosing Inpatient   Does not apply [id number]   Elmarie Shiley, MD 05/06/2017, 8:15 AM
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
## 2017-05-06 NOTE — Plan of Care (Signed)
  Fluid Volume: Ability to maintain a balanced intake and output will improve 05/06/2017 0304 - Progressing by Alonna Buckler, RN Note Receiving HD this shift.
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
## 2017-05-06 NOTE — Progress Notes (Signed)
Progress Note  Patient Name: Hayden [name] Date of Encounter: 05/06/2017  Primary Cardiologist: Dr. Ellyn Hack  Subjective   Denies any CP this AM, no SOB.  Mild discomfort at pacer site  Inpatient Medications    Scheduled Meds: . ALPRAZolam  1 mg Oral TID  . amLODipine  10 mg Oral Daily  . aspirin EC  81 mg Oral Daily  . carvedilol  12.5 mg Oral BID WC  . cloNIDine  0.2 mg Transdermal Q Fri-1800  . divalproex  500 mg Oral Q12H  . escitalopram  10 mg Oral Daily  . feeding supplement (NEPRO CARB STEADY)  237 mL Oral BID BM  . ferric citrate  630 mg Oral TID WC  . furosemide  80 mg Oral BID  . gabapentin  100 mg Oral TID  . hydrALAZINE  10 mg Intravenous Once  . hydrALAZINE  75 mg Oral Q8H  . isosorbide dinitrate  2.5 mg Oral BID  . mouth rinse  15 mL Mouth Rinse BID  . multivitamin  1 tablet Oral QHS  . nicotine  21 mg Transdermal Daily  . pravastatin  20 mg Oral Daily  . sodium chloride flush  3 mL Intravenous Q12H   Continuous Infusions: . sodium chloride 250 mL (05/06/17 0100)  . sodium chloride    . sodium chloride     PRN Meds: sodium chloride, sodium chloride, sodium chloride, acetaminophen, heparin, heparin, lidocaine (PF), lidocaine-prilocaine, loperamide, nitroGLYCERIN, ondansetron (ZOFRAN) IV, pentafluoroprop-tetrafluoroeth, sodium chloride flush, traMADol   Vital Signs    Vitals:   05/06/17 0500 05/06/17 0545 05/06/17 0600 05/06/17 0700  BP: (!) 183/66 (!) 166/56 (!) 190/121   Pulse: 68 69 70 70  Resp: 19 20 (!) 22 20  Temp:      TempSrc:      SpO2: 95%   90%  Weight:      Height:        Intake/Output Summary (Last 24 hours) at 05/06/2017 0711 Last data filed at 05/06/2017 0700 Gross per 24 hour  Intake 582.5 ml  Output 2500 ml  Net -1917.5 ml   Filed Weights   05/04/17 2330 05/05/17 2358 05/06/17 0345  Weight: 201 lb 1 oz (91.2 kg) 200 lb 9.9 oz (91 kg) 195 lb 1.7 oz (88.5 kg)    Telemetry    V paced, Aflutter - Personally  Reviewed  ECG    SR, V paced - Personally Reviewed  Physical Exam   GEN: No acute distress, even more interactive today Neck: No JVD Cardiac: RRR, 1/6 SM, rubs, or gallops.  Respiratory: CTA b/l. GI: Soft, nontender, non-distended  MS: No edema; s/p R AKA Neuro:  Nonfocal Psych: still flat though much more interactive this AM  PPM implant site: dry, no hematoma  Labs    Chemistry Recent Labs  Lab 05/02/17 1342  05/03/17 0556 05/04/17 2130 05/05/17 0745  NA 134*   < > 135 134* 136  K 5.1   < > 4.9 5.8* 4.4  CL 97*   < > 98* 98* 96*  CO2 21*  --  18* 16* 21*  GLUCOSE 138*   < > 97 126* 83  BUN 104*   < > 118* 149* 64*  CREATININE 9.17*   < > 9.67* 11.82* 7.10*  CALCIUM 8.4*  --  7.9* 7.8* 8.8*  PROT 7.0  --   --   --   --   ALBUMIN 3.1*  --   --  3.1*  --  AST 25  --   --   --   --   ALT 33  --   --   --   --   ALKPHOS 217*  --   --   --   --   BILITOT 0.8  --   --   --   --   GFRNONAA 6*  --  6* 5* 8*  GFRAA 7*  --  7* 5* 10*  ANIONGAP 16*  --  19* 20* 19*   < > = values in this interval not displayed.     Hematology Recent Labs  Lab 05/02/17 1342 05/02/17 1349 05/03/17 0556 05/05/17 0745  WBC 10.0  --  9.7 10.4  RBC 4.63  --  4.43 4.63  HGB 12.1* 13.6 11.5* 11.9*  HCT 38.2* 40.0 36.3* 37.7*  MCV 82.5  --  81.9 81.4  MCH 26.1  --  26.0 25.7*  MCHC 31.7  --  31.7 31.6  RDW 16.4*  --  16.3* 16.9*  PLT 421*  --  420* 338    Cardiac Enzymes Recent Labs  Lab 05/03/17 1536 05/03/17 2055  TROPONINI 0.20* 0.18*    Recent Labs  Lab 05/02/17 1353  TROPIPOC 0.11*     BNPNo results for input(s): BNP, PROBNP in the last 168 hours.   DDimer No results for input(s): DDIMER in the last 168 hours.   Radiology    No results found.  Cardiac Studies   TTE 02/15/17 - Left ventricle: The cavity size was normal. Systolic function was normal. The estimated ejection fraction was in the range of 60% to 65%. Wall motion was normal; there were no  regional wall motion abnormalities. Doppler parameters are consistent with a reversible restrictive pattern, indicative of decreased left ventricular diastolic compliance and/or increased left atrial pressure (grade 3 diastolic dysfunction). - Aortic valve: There was moderate stenosis. Peak velocity (S): 320 cm/s. Mean gradient (S): 21 mm Hg. Valve area (VTI): 1.38 cm^2. Valve area (Vmax): 1.47 cm^2. Valve area (Vmean): 1.37 cm^2. - Mitral valve: Moderately calcified annulus. Valve area by continuity equation (using LVOT flow): 2.13 cm^2. - Tricuspid valve: There was moderate regurgitation. - Pulmonary arteries: Systolic pressure was mildly increased. PA peak pressure: 35 mm Hg (S).    Patient Profile     46 y.o. male with history of PAD status post left AKA, ESRD on dialysis, diabetes, hypertension, and prior CVA presenting to the hospital with complete heart block    Assessment & Plan    1. CHB     s/p PPM yesterday     Site stable     Device interrogation this AM with intact function     AFlutter started 0300  2. HTN ooc     coreg added post procedure, though didn't get last PM, follow today  3. ESRF on HD     Dialysis last PM again     C/w nephrology     ([name]-F is his usual regime)  4. CP     Not an ongoing complaint     Not felt ACS, trop flat and likely 2/2 ESRF   5. Anxiety/depress     Mood is better, much more interactive and conversational today   6. New AFlutter    CHA2DS2Vasc is at least 3    Consult pharmacy for coumadin    Pace terminated via his device this AM   Potential for discharge today if BP improves.  Patient would like to get home.  For questions or updates, please contact [place] Please consult [email or web address] for contact info under Cardiology/STEMI.      Signed, Baldwin Jamaica, PA-C  05/06/2017, 7:11 AM    I have seen and examined this patient with Tommye Standard.  Agree with above, note added to reflect  my findings.  On exam, RRR, no murmurs, lungs clear. Had dual chamber pacemaker inserted for complete AV block. Went into atrial flutter overnight and paced out today. Cassidee Deats start on coumadin. BP remains elevated but usually is post dialysis. Dwayna Kentner potentially discharge today if BP comes down with morning meds..    Giovanni Biby M. Alexsandro Salek MD 05/06/2017 11:55 AM

## 2017-05-06 NOTE — Progress Notes (Signed)
HD tx initiated via 15Gx2 w/o problem, pull/push/flush equally w/o problem, VSS w/ increased bp, will cont to monitor while on HD tx 

## 2017-05-07 ENCOUNTER — Other Ambulatory Visit: Payer: Self-pay | Admitting: Pharmacist

## 2017-05-07 MED ORDER — WARFARIN SODIUM 5 MG PO TABS: ORAL_TABLET | ORAL | 0 refills | Status: DC

## 2017-05-08 ENCOUNTER — Ambulatory Visit: Payer: Self-pay | Admitting: Physician Assistant

## 2017-05-09 ENCOUNTER — Ambulatory Visit (INDEPENDENT_AMBULATORY_CARE_PROVIDER_SITE_OTHER): Payer: Medicare Other | Admitting: Pharmacist

## 2017-05-09 ENCOUNTER — Ambulatory Visit (INDEPENDENT_AMBULATORY_CARE_PROVIDER_SITE_OTHER): Payer: Medicare Other | Admitting: Physician Assistant

## 2017-05-09 ENCOUNTER — Encounter: Payer: Self-pay | Admitting: Physician Assistant

## 2017-05-09 VITALS — BP 157/85 | HR 80 | Ht 69.0 in

## 2017-05-09 DIAGNOSIS — I4892 Unspecified atrial flutter: Secondary | ICD-10-CM

## 2017-05-09 DIAGNOSIS — E785 Hyperlipidemia, unspecified: Secondary | ICD-10-CM | POA: Diagnosis not present

## 2017-05-09 DIAGNOSIS — N186 End stage renal disease: Secondary | ICD-10-CM

## 2017-05-09 DIAGNOSIS — I739 Peripheral vascular disease, unspecified: Secondary | ICD-10-CM

## 2017-05-09 DIAGNOSIS — E119 Type 2 diabetes mellitus without complications: Secondary | ICD-10-CM | POA: Diagnosis not present

## 2017-05-09 DIAGNOSIS — I7025 Atherosclerosis of native arteries of other extremities with ulceration: Secondary | ICD-10-CM

## 2017-05-09 DIAGNOSIS — I10 Essential (primary) hypertension: Secondary | ICD-10-CM

## 2017-05-09 DIAGNOSIS — I442 Atrioventricular block, complete: Secondary | ICD-10-CM

## 2017-05-09 DIAGNOSIS — Z950 Presence of cardiac pacemaker: Secondary | ICD-10-CM

## 2017-05-09 LAB — POCT INR: INR: 1.4

## 2017-05-09 MED ORDER — ISOSORBIDE DINITRATE 10 MG PO TABS: 10.0000 mg | ORAL_TABLET | Freq: Every day | ORAL | 6 refills | Status: DC
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
## 2017-05-09 NOTE — Patient Instructions (Addendum)
Medication Instructions:  INCREASE Isosorbide Dinitrate to 10 mg Take 1 tablet twice a day  Labwork: None   Testing/Procedures: None   Follow-Up: Keep upcoming appointment with Dr Curt Bears, follow up with Dr Ellyn Hack in 5 months   Any Other Special Instructions Will Be Listed Below (If Applicable).  If you need a refill on your cardiac medications before your next appointment, please call your pharmacy.

## 2017-05-11 ENCOUNTER — Encounter: Payer: Self-pay | Admitting: Physician Assistant

## 2017-05-12 ENCOUNTER — Telehealth: Payer: Self-pay

## 2017-05-12 NOTE — Telephone Encounter (Signed)
Called patient and left a message on wife's voicemail advising patient to conact office.

## 2017-05-12 NOTE — Telephone Encounter (Signed)
-----   Message from [place], [place] sent at 05/11/2017 10:00 AM EST ----- Regarding: Isosorbide dose Isodorbide dinitrate should be BID, not daily. I told to patient to increase isosorbide dinitrate from 5 mg twice a day to 10mg  twice a day during the office visit. The AVS mentioned 10mg  daily, please double check with patient and make sure he is taking twice a day dosing.   Thanks  [employer]

## 2017-05-13 NOTE — Telephone Encounter (Signed)
Attempted to call patient, left message on voicemail advising patient to contact office.

## 2017-05-15 ENCOUNTER — Encounter (INDEPENDENT_AMBULATORY_CARE_PROVIDER_SITE_OTHER): Payer: Self-pay | Admitting: Family

## 2017-05-15 ENCOUNTER — Ambulatory Visit (INDEPENDENT_AMBULATORY_CARE_PROVIDER_SITE_OTHER): Payer: Medicare Other | Admitting: Family

## 2017-05-15 DIAGNOSIS — Z89611 Acquired absence of right leg above knee: Secondary | ICD-10-CM

## 2017-05-15 DIAGNOSIS — S78111A Complete traumatic amputation at level between right hip and knee, initial encounter: Secondary | ICD-10-CM

## 2017-05-15 DIAGNOSIS — G894 Chronic pain syndrome: Secondary | ICD-10-CM | POA: Diagnosis not present

## 2017-05-15 DIAGNOSIS — I7025 Atherosclerosis of native arteries of other extremities with ulceration: Secondary | ICD-10-CM
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
## 2017-05-15 NOTE — Addendum Note (Signed)
Addended by: Maxcine Ham on: 05/15/2017 09:04 AM   Modules accepted: Orders

## 2017-05-16 ENCOUNTER — Observation Stay (HOSPITAL_COMMUNITY)
Admission: EM | Admit: 2017-05-16 | Discharge: 2017-05-17 | Disposition: A | Discharge status: Home / Self Care | Payer: Medicare Other | Attending: Internal Medicine | Admitting: Internal Medicine

## 2017-05-16 ENCOUNTER — Emergency Department (HOSPITAL_COMMUNITY): Payer: Medicare Other

## 2017-05-16 ENCOUNTER — Observation Stay (HOSPITAL_COMMUNITY): Payer: Medicare Other

## 2017-05-16 ENCOUNTER — Ambulatory Visit (HOSPITAL_COMMUNITY): Payer: Self-pay | Admitting: Licensed Clinical Social Worker

## 2017-05-16 ENCOUNTER — Encounter (HOSPITAL_COMMUNITY): Payer: Self-pay | Admitting: Internal Medicine

## 2017-05-16 ENCOUNTER — Other Ambulatory Visit: Payer: Self-pay

## 2017-05-16 DIAGNOSIS — I132 Hypertensive heart and chronic kidney disease with heart failure and with stage 5 chronic kidney disease, or end stage renal disease: Secondary | ICD-10-CM | POA: Diagnosis not present

## 2017-05-16 DIAGNOSIS — I5042 Chronic combined systolic (congestive) and diastolic (congestive) heart failure: Secondary | ICD-10-CM | POA: Insufficient documentation

## 2017-05-16 DIAGNOSIS — Z8673 Personal history of transient ischemic attack (TIA), and cerebral infarction without residual deficits: Secondary | ICD-10-CM | POA: Insufficient documentation

## 2017-05-16 DIAGNOSIS — S78111A Complete traumatic amputation at level between right hip and knee, initial encounter: Secondary | ICD-10-CM

## 2017-05-16 DIAGNOSIS — I161 Hypertensive emergency: Secondary | ICD-10-CM | POA: Diagnosis not present

## 2017-05-16 DIAGNOSIS — I483 Typical atrial flutter: Secondary | ICD-10-CM | POA: Diagnosis not present

## 2017-05-16 DIAGNOSIS — R4182 Altered mental status, unspecified: Secondary | ICD-10-CM | POA: Insufficient documentation

## 2017-05-16 DIAGNOSIS — E1122 Type 2 diabetes mellitus with diabetic chronic kidney disease: Secondary | ICD-10-CM | POA: Insufficient documentation

## 2017-05-16 DIAGNOSIS — R0602 Shortness of breath: Secondary | ICD-10-CM | POA: Insufficient documentation

## 2017-05-16 DIAGNOSIS — E1142 Type 2 diabetes mellitus with diabetic polyneuropathy: Secondary | ICD-10-CM | POA: Diagnosis present

## 2017-05-16 DIAGNOSIS — R000 Tachycardia, unspecified: Secondary | ICD-10-CM

## 2017-05-16 DIAGNOSIS — N186 End stage renal disease: Secondary | ICD-10-CM

## 2017-05-16 DIAGNOSIS — F17210 Nicotine dependence, cigarettes, uncomplicated: Secondary | ICD-10-CM | POA: Insufficient documentation

## 2017-05-16 DIAGNOSIS — R002 Palpitations: Secondary | ICD-10-CM | POA: Diagnosis not present

## 2017-05-16 DIAGNOSIS — R400 Somnolence: Secondary | ICD-10-CM | POA: Diagnosis not present

## 2017-05-16 DIAGNOSIS — Z95828 Presence of other vascular implants and grafts: Secondary | ICD-10-CM

## 2017-05-16 DIAGNOSIS — R748 Abnormal levels of other serum enzymes: Secondary | ICD-10-CM

## 2017-05-16 DIAGNOSIS — Z950 Presence of cardiac pacemaker: Secondary | ICD-10-CM | POA: Diagnosis not present

## 2017-05-16 DIAGNOSIS — I249 Acute ischemic heart disease, unspecified: Secondary | ICD-10-CM | POA: Diagnosis present

## 2017-05-16 DIAGNOSIS — R7989 Other specified abnormal findings of blood chemistry: Secondary | ICD-10-CM

## 2017-05-16 DIAGNOSIS — E1140 Type 2 diabetes mellitus with diabetic neuropathy, unspecified: Secondary | ICD-10-CM | POA: Diagnosis not present

## 2017-05-16 DIAGNOSIS — Z7901 Long term (current) use of anticoagulants: Secondary | ICD-10-CM | POA: Insufficient documentation

## 2017-05-16 DIAGNOSIS — Z79899 Other long term (current) drug therapy: Secondary | ICD-10-CM | POA: Insufficient documentation

## 2017-05-16 DIAGNOSIS — R778 Other specified abnormalities of plasma proteins: Secondary | ICD-10-CM | POA: Insufficient documentation

## 2017-05-16 DIAGNOSIS — I4892 Unspecified atrial flutter: Secondary | ICD-10-CM | POA: Diagnosis not present

## 2017-05-16 DIAGNOSIS — Z85528 Personal history of other malignant neoplasm of kidney: Secondary | ICD-10-CM | POA: Diagnosis not present

## 2017-05-16 DIAGNOSIS — Z7902 Long term (current) use of antithrombotics/antiplatelets: Secondary | ICD-10-CM | POA: Insufficient documentation

## 2017-05-16 DIAGNOSIS — I442 Atrioventricular block, complete: Secondary | ICD-10-CM | POA: Diagnosis present

## 2017-05-16 DIAGNOSIS — Z992 Dependence on renal dialysis: Secondary | ICD-10-CM | POA: Diagnosis not present

## 2017-05-16 DIAGNOSIS — I5022 Chronic systolic (congestive) heart failure: Secondary | ICD-10-CM | POA: Diagnosis present

## 2017-05-16 DIAGNOSIS — R079 Chest pain, unspecified: Principal | ICD-10-CM

## 2017-05-16 DIAGNOSIS — I160 Hypertensive urgency: Secondary | ICD-10-CM

## 2017-05-16 HISTORY — DX: Unspecified atrial flutter: I48.92

## 2017-05-16 LAB — CBC WITH DIFFERENTIAL/PLATELET
Basophils Absolute: 0.1 10*3/uL (ref 0.0–0.1)
Basophils Relative: 1 %
EOS PCT: 3 %
Eosinophils Absolute: 0.4 10*3/uL (ref 0.0–0.7)
HEMATOCRIT: 33.7 % — AB (ref 39.0–52.0)
Hemoglobin: 10.6 g/dL — ABNORMAL LOW (ref 13.0–17.0)
Lymphocytes Relative: 5 %
Lymphs Abs: 0.6 10*3/uL — ABNORMAL LOW (ref 0.7–4.0)
MCH: 25.9 pg — AB (ref 26.0–34.0)
MCHC: 31.5 g/dL (ref 30.0–36.0)
MCV: 82.2 fL (ref 78.0–100.0)
MONO ABS: 1.5 10*3/uL — AB (ref 0.1–1.0)
MONOS PCT: 14 %
NEUTROS ABS: 8.4 10*3/uL — AB (ref 1.7–7.7)
Neutrophils Relative %: 77 %
PLATELETS: 415 10*3/uL — AB (ref 150–400)
RBC: 4.1 MIL/uL — ABNORMAL LOW (ref 4.22–5.81)
RDW: 17.3 % — AB (ref 11.5–15.5)
WBC: 10.9 10*3/uL — ABNORMAL HIGH (ref 4.0–10.5)

## 2017-05-16 LAB — I-STAT CHEM 8, ED
BUN: 61 mg/dL — AB (ref 6–20)
CALCIUM ION: 1.2 mmol/L (ref 1.15–1.40)
CREATININE: 6.3 mg/dL — AB (ref 0.61–1.24)
Chloride: 100 mmol/L — ABNORMAL LOW (ref 101–111)
Glucose, Bld: 133 mg/dL — ABNORMAL HIGH (ref 65–99)
HEMATOCRIT: 34 % — AB (ref 39.0–52.0)
HEMOGLOBIN: 11.6 g/dL — AB (ref 13.0–17.0)
Potassium: 4.4 mmol/L (ref 3.5–5.1)
SODIUM: 134 mmol/L — AB (ref 135–145)
TCO2: 26 mmol/L (ref 22–32)

## 2017-05-16 LAB — PROTIME-INR
INR: 1.33
Prothrombin Time: 16.4 seconds — ABNORMAL HIGH (ref 11.4–15.2)

## 2017-05-16 LAB — TROPONIN I
Troponin I: 0.77 ng/mL (ref <0.03)
Troponin I: 3.08 ng/mL (ref <0.03)

## 2017-05-16 LAB — AMMONIA: AMMONIA: 49 umol/L — AB (ref 9–35)

## 2017-05-16 LAB — MRSA PCR SCREENING: MRSA BY PCR: NEGATIVE

## 2017-05-16 LAB — VALPROIC ACID LEVEL: Valproic Acid Lvl: <10 ug/mL — ABNORMAL LOW (ref 50.0–100.0)

## 2017-05-16 MED ORDER — PANTOPRAZOLE SODIUM 40 MG PO TBEC
40.0000 mg | DELAYED_RELEASE_TABLET | Freq: Every day | ORAL | Status: DC
  Administered 2017-05-16: 40 mg via ORAL
  Filled 2017-05-16: qty 1

## 2017-05-16 MED ORDER — ONDANSETRON HCL 4 MG PO TABS: 4.0000 mg | ORAL_TABLET | Freq: Four times a day (QID) | ORAL | Status: DC | PRN

## 2017-05-16 MED ORDER — HYDRALAZINE HCL 50 MG PO TABS
100.0000 mg | ORAL_TABLET | Freq: Three times a day (TID) | ORAL | Status: DC
  Administered 2017-05-16 – 2017-05-17 (×2): 100 mg via ORAL
  Filled 2017-05-16 (×2): qty 2

## 2017-05-16 MED ORDER — ACETAMINOPHEN 650 MG RE SUPP: 650.0000 mg | Freq: Four times a day (QID) | RECTAL | Status: DC | PRN

## 2017-05-16 MED ORDER — CARVEDILOL 12.5 MG PO TABS: 12.5000 mg | ORAL_TABLET | Freq: Two times a day (BID) | ORAL | Status: DC

## 2017-05-16 MED ORDER — HYDRALAZINE HCL 20 MG/ML IJ SOLN: 10.0000 mg | INTRAMUSCULAR | Status: DC | PRN

## 2017-05-16 MED ORDER — IRBESARTAN 300 MG PO TABS
300.0000 mg | ORAL_TABLET | Freq: Every day | ORAL | Status: DC
  Administered 2017-05-17: 300 mg via ORAL
  Filled 2017-05-16: qty 2
  Filled 2017-05-16: qty 1

## 2017-05-16 MED ORDER — ACETAMINOPHEN 325 MG PO TABS
650.0000 mg | ORAL_TABLET | Freq: Four times a day (QID) | ORAL | Status: DC | PRN
  Administered 2017-05-17: 650 mg via ORAL
  Filled 2017-05-16: qty 2

## 2017-05-16 MED ORDER — ISOSORBIDE DINITRATE 10 MG PO TABS
10.0000 mg | ORAL_TABLET | Freq: Every day | ORAL | Status: DC
  Administered 2017-05-17: 10 mg via ORAL
  Filled 2017-05-16: qty 1

## 2017-05-16 MED ORDER — CARVEDILOL 12.5 MG PO TABS
12.5000 mg | ORAL_TABLET | Freq: Two times a day (BID) | ORAL | Status: DC
  Administered 2017-05-16 – 2017-05-17 (×2): 12.5 mg via ORAL
  Filled 2017-05-16 (×2): qty 1

## 2017-05-16 MED ORDER — ALPRAZOLAM 0.5 MG PO TABS
1.0000 mg | ORAL_TABLET | Freq: Three times a day (TID) | ORAL | Status: DC | PRN
  Administered 2017-05-16 – 2017-05-17 (×2): 1 mg via ORAL
  Filled 2017-05-16 (×2): qty 2

## 2017-05-16 MED ORDER — WARFARIN - PHARMACIST DOSING INPATIENT: Freq: Every day | Status: DC

## 2017-05-16 MED ORDER — WARFARIN SODIUM 10 MG PO TABS
10.0000 mg | ORAL_TABLET | Freq: Once | ORAL | Status: AC
  Administered 2017-05-16: 10 mg via ORAL
  Filled 2017-05-16: qty 1

## 2017-05-16 MED ORDER — ESCITALOPRAM OXALATE 10 MG PO TABS
10.0000 mg | ORAL_TABLET | Freq: Every day | ORAL | Status: DC
Start: 2017-05-17 — End: 2017-05-17
  Administered 2017-05-17: 10 mg via ORAL
  Filled 2017-05-16: qty 1

## 2017-05-16 MED ORDER — ONDANSETRON HCL 4 MG/2ML IJ SOLN: 4.0000 mg | Freq: Four times a day (QID) | INTRAMUSCULAR | Status: DC | PRN

## 2017-05-16 MED ORDER — NITROGLYCERIN 0.4 MG SL SUBL
0.4000 mg | SUBLINGUAL_TABLET | Freq: Once | SUBLINGUAL | Status: AC
  Administered 2017-05-16: 0.4 mg via SUBLINGUAL
  Filled 2017-05-16: qty 1

## 2017-05-16 MED ORDER — GABAPENTIN 100 MG PO CAPS
100.0000 mg | ORAL_CAPSULE | Freq: Two times a day (BID) | ORAL | Status: DC
  Administered 2017-05-17: 100 mg via ORAL
  Filled 2017-05-16: qty 1

## 2017-05-16 MED ORDER — FUROSEMIDE 20 MG PO TABS
80.0000 mg | ORAL_TABLET | Freq: Two times a day (BID) | ORAL | Status: DC
  Administered 2017-05-17: 80 mg via ORAL
  Filled 2017-05-16: qty 4

## 2017-05-16 MED ORDER — FERRIC CITRATE 1 GM 210 MG(FE) PO TABS
630.0000 mg | ORAL_TABLET | Freq: Three times a day (TID) | ORAL | Status: DC
  Administered 2017-05-17: 630 mg via ORAL
  Filled 2017-05-16 (×3): qty 3

## 2017-05-16 MED ORDER — DIVALPROEX SODIUM 125 MG PO CSDR
500.0000 mg | DELAYED_RELEASE_CAPSULE | Freq: Two times a day (BID) | ORAL | Status: DC
  Administered 2017-05-16 – 2017-05-17 (×2): 500 mg via ORAL
  Filled 2017-05-16 (×3): qty 4

## 2017-05-16 MED ORDER — CINACALCET HCL 30 MG PO TABS
60.0000 mg | ORAL_TABLET | Freq: Every day | ORAL | Status: DC
  Filled 2017-05-16: qty 2

## 2017-05-16 MED ORDER — CLONIDINE HCL 0.2 MG/24HR TD PTWK
0.2000 mg | MEDICATED_PATCH | TRANSDERMAL | Status: DC
  Filled 2017-05-16: qty 1

## 2017-05-16 MED ORDER — HYDRALAZINE HCL 20 MG/ML IJ SOLN
10.0000 mg | Freq: Once | INTRAMUSCULAR | Status: AC
  Administered 2017-05-16: 10 mg via INTRAVENOUS
  Filled 2017-05-16: qty 1

## 2017-05-16 MED ORDER — IOPAMIDOL (ISOVUE-370) INJECTION 76%
INTRAVENOUS | Status: AC
  Administered 2017-05-16: 100 mL
  Filled 2017-05-16: qty 100

## 2017-05-16 MED ORDER — PRAVASTATIN SODIUM 40 MG PO TABS
20.0000 mg | ORAL_TABLET | Freq: Every day | ORAL | Status: DC
  Administered 2017-05-16: 20 mg via ORAL
  Filled 2017-05-16: qty 1

## 2017-05-16 MED ORDER — CLONIDINE HCL 0.2 MG/24HR TD PTWK: 0.2000 mg | MEDICATED_PATCH | TRANSDERMAL | Status: DC

## 2017-05-16 MED ORDER — NITROGLYCERIN 0.4 MG/HR TD PT24
0.4000 mg | MEDICATED_PATCH | Freq: Every day | TRANSDERMAL | Status: DC
  Administered 2017-05-17: 0.4 mg via TRANSDERMAL
  Filled 2017-05-16 (×2): qty 1

## 2017-05-16 MED ORDER — RENA-VITE PO TABS
1.0000 | ORAL_TABLET | Freq: Every day | ORAL | Status: DC
  Administered 2017-05-16: 1 via ORAL
  Filled 2017-05-16: qty 1

## 2017-05-16 MED ORDER — AMLODIPINE BESYLATE 5 MG PO TABS
10.0000 mg | ORAL_TABLET | Freq: Every day | ORAL | Status: DC
  Administered 2017-05-17: 10 mg via ORAL
  Filled 2017-05-16: qty 2

## 2017-05-16 NOTE — ED Triage Notes (Signed)
Pt states he has chest pain that began approximately 1hr ago, pt also reports SOB. He states he feels as though someone is standing on his chest. Pt alert and oriented, skin warm and dry, appears pale in color. Recently seen here for bradycardia.
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
## 2017-05-16 NOTE — ED Notes (Signed)
Patient transported to CT 

## 2017-05-16 NOTE — Progress Notes (Addendum)
Dr. Hal Hope at bedside. MD aware of elevated troponin and blood pressure. Orders to keep patient npo except medications and nitroglycerine patch received. Patient resting in bed without chest pain at this time.   0020: patient with  New complaints of chest pain. 0022: patient without complaints of chest pain. EKG obtianed and upladed to chart. Patient slept through EKG and is resting in bed. STAT orders received for heparin drip from Dr. Hal Hope. Will hang once pharmacy approves.

## 2017-05-17 DIAGNOSIS — I5022 Chronic systolic (congestive) heart failure: Secondary | ICD-10-CM | POA: Diagnosis not present

## 2017-05-17 DIAGNOSIS — R000 Tachycardia, unspecified: Secondary | ICD-10-CM | POA: Diagnosis not present

## 2017-05-17 DIAGNOSIS — I161 Hypertensive emergency: Secondary | ICD-10-CM | POA: Diagnosis not present

## 2017-05-17 DIAGNOSIS — R079 Chest pain, unspecified: Secondary | ICD-10-CM | POA: Diagnosis not present

## 2017-05-17 DIAGNOSIS — Z89611 Acquired absence of right leg above knee: Secondary | ICD-10-CM

## 2017-05-17 DIAGNOSIS — Z992 Dependence on renal dialysis: Secondary | ICD-10-CM | POA: Diagnosis not present

## 2017-05-17 DIAGNOSIS — R0602 Shortness of breath: Secondary | ICD-10-CM | POA: Diagnosis not present

## 2017-05-17 DIAGNOSIS — I214 Non-ST elevation (NSTEMI) myocardial infarction: Secondary | ICD-10-CM | POA: Diagnosis not present

## 2017-05-17 DIAGNOSIS — N186 End stage renal disease: Secondary | ICD-10-CM | POA: Diagnosis not present

## 2017-05-17 LAB — CBC
HCT: 31.6 % — ABNORMAL LOW (ref 39.0–52.0)
Hemoglobin: 9.8 g/dL — ABNORMAL LOW (ref 13.0–17.0)
MCH: 25.5 pg — ABNORMAL LOW (ref 26.0–34.0)
MCHC: 31 g/dL (ref 30.0–36.0)
MCV: 82.1 fL (ref 78.0–100.0)
PLATELETS: 386 10*3/uL (ref 150–400)
RBC: 3.85 MIL/uL — ABNORMAL LOW (ref 4.22–5.81)
RDW: 17 % — AB (ref 11.5–15.5)
WBC: 9.7 10*3/uL (ref 4.0–10.5)

## 2017-05-17 LAB — BASIC METABOLIC PANEL
Anion gap: 14 (ref 5–15)
BUN: 71 mg/dL — AB (ref 6–20)
CALCIUM: 9.5 mg/dL (ref 8.9–10.3)
CO2: 24 mmol/L (ref 22–32)
CREATININE: 7.53 mg/dL — AB (ref 0.61–1.24)
Chloride: 99 mmol/L — ABNORMAL LOW (ref 101–111)
GFR calc Af Amer: 9 mL/min — ABNORMAL LOW (ref >60)
GFR, EST NON AFRICAN AMERICAN: 8 mL/min — AB (ref >60)
Glucose, Bld: 115 mg/dL — ABNORMAL HIGH (ref 65–99)
Potassium: 4.6 mmol/L (ref 3.5–5.1)
SODIUM: 137 mmol/L (ref 135–145)

## 2017-05-17 LAB — TROPONIN I
TROPONIN I: 5.32 ng/mL — AB (ref <0.03)
TROPONIN I: 5.72 ng/mL — AB (ref <0.03)

## 2017-05-17 LAB — TSH: TSH: 2.516 u[IU]/mL (ref 0.350–4.500)

## 2017-05-17 LAB — HEPARIN LEVEL (UNFRACTIONATED)

## 2017-05-17 MED ORDER — HEPARIN BOLUS VIA INFUSION
2000.0000 [IU] | Freq: Once | INTRAVENOUS | Status: AC
  Administered 2017-05-17: 2000 [IU] via INTRAVENOUS
  Filled 2017-05-17: qty 2000

## 2017-05-17 MED ORDER — ASPIRIN 81 MG PO CHEW: 81.0000 mg | CHEWABLE_TABLET | Freq: Every day | ORAL | Status: DC

## 2017-05-17 MED ORDER — HEPARIN (PORCINE) IN NACL 100-0.45 UNIT/ML-% IJ SOLN
1300.0000 [IU]/h | INTRAMUSCULAR | Status: DC
  Administered 2017-05-17: 1300 [IU]/h via INTRAVENOUS
  Filled 2017-05-17: qty 250

## 2017-05-17 NOTE — Progress Notes (Addendum)
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 Seen by EP for chest pain and palpitations. Noted to have elevated troponin.  Subjective   Chest pain free today. Troponin elevated (3.02 -> 5.32 -> 5.72). On IV heparin. EKG shows AV paced rhythm. Long discussion with patient and family in the room about recommendations for heart catheterization, however, he is refusing. Does not want any more procedures.  Objective   Vitals:   05/17/17 0450 05/17/17 0636 05/17/17 0805 05/17/17 0902  BP: (!) 122/46 (!) 169/88 (!) 145/83   Pulse: 90   83  Resp: 12     Temp: 98.8 F (37.1 C)   (!) 97.3 F (36.3 C)  TempSrc:    Oral  SpO2: 94%   98%  Weight: 214 lb 1.1 oz (97.1 kg)     Height:        Intake/Output Summary (Last 24 hours) at 05/17/2017 1052 Last data filed at 05/17/2017 0400 Gross per 24 hour  Intake 277.92 ml  Output -  Net 277.92 ml   Filed Weights   05/16/17 1738 05/16/17 2140 05/17/17 0450  Weight: 187 lb (84.8 kg) 214 lb 4.6 oz (97.2 kg) 214 lb 1.1 oz (97.1 kg)    Physical Exam   General appearance: alert and no distress Lungs: clear to auscultation bilaterally Heart: regular rate and rhythm and pacer in right upper chest Extremities: s/p R BKA Neurologic: Grossly normal  Inpatient Medications    Scheduled Meds: . amLODipine  10 mg Oral Daily  . carvedilol  12.5 mg Oral BID WC  . cinacalcet  60 mg Oral Q breakfast  . [START ON 05/23/2017] cloNIDine  0.2 mg Transdermal Q Fri  . divalproex  500 mg Oral Q12H  . escitalopram  10 mg Oral Daily  . ferric citrate  630 mg Oral TID WC  . furosemide  80 mg Oral BID  . gabapentin  100 mg Oral BID  . hydrALAZINE  100 mg Oral Q8H  . irbesartan  300 mg Oral Daily  . isosorbide dinitrate  10  mg Oral Daily  . multivitamin  1 tablet Oral QHS  . nitroGLYCERIN  0.4 mg Transdermal Daily  . pantoprazole  40 mg Oral QHS  . pravastatin  20 mg Oral QHS    Continuous Infusions: . heparin 1,300 Units/hr (05/17/17 0105)    PRN Meds: acetaminophen **OR** acetaminophen, ALPRAZolam, hydrALAZINE, ondansetron **OR** ondansetron (ZOFRAN) IV   Labs   Results for orders placed or performed during the hospital encounter of 05/16/17 (from the past 48 hour(s))  Troponin I     Status: Abnormal   Collection Time: 05/16/17  5:08 PM  Result Value Ref Range   Troponin I 0.77 (HH) <0.03 ng/mL    Comment: CRITICAL RESULT CALLED TO, READ BACK BY AND VERIFIED WITH: KENNEDY,R RN 05/16/2017 1819 JORDANS   I-stat Chem 8, ED     Status: Abnormal   Collection Time: 05/16/17  5:25 PM  Result Value Ref Range   Sodium 134 (L) 135 - 145 mmol/L   Potassium 4.4 3.5 - 5.1 mmol/L   Chloride 100 (L) 101 - 111 mmol/L   BUN 61 (H) 6 - 20 mg/dL   Creatinine, Ser 6.30 (H) 0.61 - 1.24 mg/dL  Glucose, Bld 133 (H) 65 - 99 mg/dL   Calcium, Ion 1.20 1.15 - 1.40 mmol/L   TCO2 26 22 - 32 mmol/L   Hemoglobin 11.6 (L) 13.0 - 17.0 g/dL   HCT 34.0 (L) 39.0 - 52.0 %  Valproic acid level     Status: Abnormal   Collection Time: 05/16/17  5:39 PM  Result Value Ref Range   Valproic Acid Lvl <10 (L) 50.0 - 100.0 ug/mL    Comment: RESULTS CONFIRMED BY MANUAL DILUTION  Protime-INR     Status: Abnormal   Collection Time: 05/16/17  5:48 PM  Result Value Ref Range   Prothrombin Time 16.4 (H) 11.4 - 15.2 seconds   INR 1.33   CBC with Differential     Status: Abnormal   Collection Time: 05/16/17  5:48 PM  Result Value Ref Range   WBC 10.9 (H) 4.0 - 10.5 K/uL   RBC 4.10 (L) 4.22 - 5.81 MIL/uL   Hemoglobin 10.6 (L) 13.0 - 17.0 g/dL   HCT 33.7 (L) 39.0 - 52.0 %   MCV 82.2 78.0 - 100.0 fL   MCH 25.9 (L) 26.0 - 34.0 pg   MCHC 31.5 30.0 - 36.0 g/dL   RDW 17.3 (H) 11.5 - 15.5 %   Platelets 415 (H) 150 - 400 K/uL    Neutrophils Relative % 77 %   Neutro Abs 8.4 (H) 1.7 - 7.7 K/uL   Lymphocytes Relative 5 %   Lymphs Abs 0.6 (L) 0.7 - 4.0 K/uL   Monocytes Relative 14 %   Monocytes Absolute 1.5 (H) 0.1 - 1.0 K/uL   Eosinophils Relative 3 %   Eosinophils Absolute 0.4 0.0 - 0.7 K/uL   Basophils Relative 1 %   Basophils Absolute 0.1 0.0 - 0.1 K/uL  MRSA PCR Screening     Status: None   Collection Time: 05/16/17  9:38 PM  Result Value Ref Range   MRSA by PCR NEGATIVE NEGATIVE    Comment:        The GeneXpert MRSA Assay (FDA approved for NASAL specimens only), is one component of a comprehensive MRSA colonization surveillance program. It is not intended to diagnose MRSA infection nor to guide or monitor treatment for MRSA infections.   Ammonia     Status: Abnormal   Collection Time: 05/16/17  9:47 PM  Result Value Ref Range   Ammonia 49 (H) 9 - 35 umol/L  Troponin I (q 6hr x 3)     Status: Abnormal   Collection Time: 05/16/17  9:47 PM  Result Value Ref Range   Troponin I 3.08 (HH) <0.03 ng/mL    Comment: CRITICAL RESULT CALLED TO, READ BACK BY AND VERIFIED WITH: SIMPSON,B RN 05/16/2017 2325 JORDANS   Basic metabolic panel     Status: Abnormal   Collection Time: 05/17/17  2:30 AM  Result Value Ref Range   Sodium 137 135 - 145 mmol/L   Potassium 4.6 3.5 - 5.1 mmol/L   Chloride 99 (L) 101 - 111 mmol/L   CO2 24 22 - 32 mmol/L   Glucose, Bld 115 (H) 65 - 99 mg/dL   BUN 71 (H) 6 - 20 mg/dL   Creatinine, Ser 7.53 (H) 0.61 - 1.24 mg/dL   Calcium 9.5 8.9 - 10.3 mg/dL   GFR calc non Af Amer 8 (L) >60 mL/min   GFR calc Af Amer 9 (L) >60 mL/min    Comment: (NOTE) The eGFR has been calculated using the CKD EPI equation. This calculation has not  been validated in all clinical situations. eGFR's persistently <60 mL/min signify possible Chronic Kidney Disease.    Anion gap 14 5 - 15  CBC     Status: Abnormal   Collection Time: 05/17/17  2:30 AM  Result Value Ref Range   WBC 9.7 4.0 - 10.5 K/uL     RBC 3.85 (L) 4.22 - 5.81 MIL/uL   Hemoglobin 9.8 (L) 13.0 - 17.0 g/dL   HCT 31.6 (L) 39.0 - 52.0 %   MCV 82.1 78.0 - 100.0 fL   MCH 25.5 (L) 26.0 - 34.0 pg   MCHC 31.0 30.0 - 36.0 g/dL   RDW 17.0 (H) 11.5 - 15.5 %   Platelets 386 150 - 400 K/uL  Troponin I (q 6hr x 3)     Status: Abnormal   Collection Time: 05/17/17  2:30 AM  Result Value Ref Range   Troponin I 5.32 (HH) <0.03 ng/mL    Comment: CRITICAL VALUE NOTED.  VALUE IS CONSISTENT WITH PREVIOUSLY REPORTED AND CALLED VALUE.  Troponin I (q 6hr x 3)     Status: Abnormal   Collection Time: 05/17/17  9:18 AM  Result Value Ref Range   Troponin I 5.72 (HH) <0.03 ng/mL    Comment: CRITICAL VALUE NOTED.  VALUE IS CONSISTENT WITH PREVIOUSLY REPORTED AND CALLED VALUE.  Heparin level (unfractionated)     Status: Abnormal   Collection Time: 05/17/17  9:18 AM  Result Value Ref Range   Heparin Unfractionated <0.10 (L) 0.30 - 0.70 IU/mL    Comment:        IF HEPARIN RESULTS ARE BELOW EXPECTED VALUES, AND PATIENT DOSAGE HAS BEEN CONFIRMED, SUGGEST FOLLOW UP TESTING OF ANTITHROMBIN III LEVELS.   TSH     Status: None   Collection Time: 05/17/17  9:18 AM  Result Value Ref Range   TSH 2.516 0.350 - 4.500 uIU/mL    Comment: Performed by a 3rd Generation assay with a functional sensitivity of <=0.01 uIU/mL.    ECG   N/A  Telemetry   AV Paced rhythm - Personally Reviewed  Radiology    Ct Head Wo Contrast  Result Date: 05/16/2017 CLINICAL DATA:  Altered mental status EXAM: CT HEAD WITHOUT CONTRAST TECHNIQUE: Contiguous axial images were obtained from the base of the skull through the vertex without intravenous contrast. COMPARISON:  Brain MRI 06/19/2015 FINDINGS: Brain: No mass lesion, intraparenchymal hemorrhage or extra-axial collection. No evidence of acute cortical infarct. There are scattered cortical punctate calcifications. Old right basal ganglia lacunar infarct. Vascular: No hyperdense vessel or unexpected calcification.  Hyperdensity of the venous sinuses is secondary to contrast administration for the earlier performed CTA of the chest, abdomen and pelvis. Skull: Normal visualized skull base, calvarium and extracranial soft tissues. Sinuses/Orbits: No sinus fluid levels or advanced mucosal thickening. No mastoid effusion. Normal orbits. IMPRESSION: Old right basal ganglia punctate lacunar infarct. No acute abnormality. Electronically Signed   By: Ulyses Jarred M.D.   On: 05/16/2017 22:54   Dg Chest Portable 1 View  Result Date: 05/16/2017 CLINICAL DATA:  Chest pain and shortness of Breath EXAM: PORTABLE CHEST 1 VIEW COMPARISON:  05/06/2017 FINDINGS: Cardiac shadow remains enlarged. Pacing device is again seen and stable. Aortic calcifications are again noted. The overall inspiratory effort is stable without focal infiltrate. No other focal abnormality is noted. IMPRESSION: No acute abnormality seen. Electronically Signed   By: Inez Catalina M.D.   On: 05/16/2017 17:55   Ct Angio Chest/abd/pel For Dissection W And/or Wo Contrast  Result  Date: 05/16/2017 CLINICAL DATA:  Chest and back pain EXAM: CT ANGIOGRAPHY CHEST, ABDOMEN AND PELVIS TECHNIQUE: Multidetector CT imaging through the chest, abdomen and pelvis was performed using the standard protocol during bolus administration of intravenous contrast. Multiplanar reconstructed images and MIPs were obtained and reviewed to evaluate the vascular anatomy. CONTRAST:  185m ISOVUE-370 IOPAMIDOL (ISOVUE-370) INJECTION 76% COMPARISON:  CT chest 02/14/2017, CT abdomen pelvis 810 2018, 10/30/2015 FINDINGS: CTA CHEST FINDINGS Cardiovascular: Non contrasted images of the chest demonstrate no evidence for intramural hematoma. Extensive atherosclerotic vascular calcification of the aorta. Calcifications at the origins of the great vessels, without high-grade stenosis. Extensive coronary artery calcification. Partially visualized intracardiac pacing leads. Mild cardiomegaly. Mitral  annular calcification. No large pericardial effusion. Following contrast, no dissection is seen. No definitive filling defects in the central pulmonary vessels. Small amount of air in the pulmonary trunk likely due to venous injection. Mediastinum/Nodes: Midline trachea. Stable calcified nodule near the left thyroid isthmus. Re- demonstrated mediastinal adenopathy. Right paratracheal lymph node measures 16 mm compared with 15 mm previously. Lymph node adjacent to the aortic arch measures 7 mm, compared with 8 mm previously. Additional prominent lymph nodes in the mediastinum. Esophagus within normal limits Lungs/Pleura: Negative for pneumothorax, consolidation or effusion. Mild hazy densities could reflect diffuse atelectasis or mild edema. Musculoskeletal: No acute or suspicious lesion. Degenerative changes of the spine. Review of the MIP images confirms the above findings. CTA ABDOMEN AND PELVIS FINDINGS VASCULAR Aorta: Extensive atherosclerotic calcification of the aorta. No aneurysmal dilatation or dissection is seen. Celiac: Mild stenosis at the origin of the celiac artery. Diffuse calcifications throughout the splenic and hepatic artery. SMA: Moderate-to-marked stenosis at the origin of the SMA. Scattered calcifications along the course of the SMA but distal vessel patency and no occlusion. Renals: Single right and single left renal artery. Extensive calcification at the origin of both artery. Suspected moderate stenosis at the origin of the right renal artery. Moderate severe stenosis of the proximal left renal artery. IMA: Patent Inflow: Extensive calcification of the distal aorta and common iliac vessels. No focal stenosis. Moderate stenosis of the origin of right internal iliac vessel. Extensive diffuse calcification of the distal branch vessels of both internal iliac vessels. Mild diffuse disease of the right external iliac artery. There appears to be a partially visualize right femoral bypass graft which  may be occluded. The native vessel demonstrates severe stenosis of the proximal SFA. Left external iliac vessel demonstrates mild diffuse disease but no high-grade stenosis. Mild stenosis of the proximal left SFA. Veins: No obvious venous abnormality within the limitations of this arterial phase study. Review of the MIP images confirms the above findings. NON-VASCULAR Hepatobiliary: No focal hepatic abnormality. Multiple calcifications at the gallbladder fossa post cholecystectomy likely representing old hematoma. No biliary dilatation Pancreas: Unremarkable. No pancreatic ductal dilatation or surrounding inflammatory changes. Spleen: Normal in size without focal abnormality. Adrenals/Urinary Tract: Adrenal glands are within normal limits. Atrophic appearing kidneys. No hydronephrosis. Bladder within normal limits. Stomach/Bowel: Stomach is nonenlarged. No dilated small bowel. No colon wall thickening. Normal appendix. Scattered sigmoid colon diverticula. Lymphatic: No significantly enlarged lymph nodes Reproductive: Prostate is unremarkable. Other: Negative for free air. Tiny free fluid in the pelvis. Moderate diffuse subcutaneous edema. Fat in the umbilicus. Musculoskeletal: Degenerative changes. No acute or suspicious lesion Review of the MIP images confirms the above findings. IMPRESSION: 1. Negative for acute aortic dissection. No aneurysm. No obvious filling defect in the central pulmonary vessels 2. Extensive vascular calcification throughout the aorta  and its branch vessels. Stenotic lesions involving the celiac, SMA, and renal vessels as detailed above. 3. Cardiomegaly with hazy pulmonary density which may reflect diffuse atelectasis or mild edema. 4. Stable mediastinal adenopathy 5. Small amount of free fluid in the pelvis.  Atrophic kidneys. Electronically Signed   By: Donavan Foil M.D.   On: 05/16/2017 21:10    Cardiac Studies   N/A  Assessment   1. Active Problems: 2.   Hypertensive urgency,  malignant 3.   Chest pain 4.   Acute coronary syndrome (HCC) 5.   Chronic systolic congestive heart failure/EF 40% PER echo 2010 6.   S/P femoral-popliteal bypass surgery 7.   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   ESRD on dialysis ([place]) 9.   Type 2 diabetes mellitus with peripheral neuropathy (HCC) 10.   Complete heart block ([place]) 11.   Plan   1. NSTEMI by troponin- he is refusing heart catheterization. Was informed that he is at risk for fatal heart attack or recurrent anginal symptoms. He understands this and does not want to proceed with further work-up. He wishes to be discharged - he explicitly said he does not want to see the hospitalist. I will reach out to them. Continue ASA and warfarin.  Cardiology will sign-off. Follow-up with EP in device clinic.  Time Spent Directly with Patient:  I have spent a total of 15 minutes with the patient reviewing hospital notes, telemetry, EKGs, labs and examining the patient as well as establishing an assessment and plan that was discussed personally with the patient. > 50% of time was spent in direct patient care.  Length of Stay:  LOS: 0 days   Pixie Casino, MD, [facility], [place] Director of the Advanced Lipid Disorders &  Cardiovascular Risk Reduction Clinic Attending Cardiologist  Direct Dial: [phone]  Fax: [phone]  Website:  [email or web address] 05/17/2017, 10:52 AM
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
## 2017-05-17 NOTE — Progress Notes (Signed)
Spoke at length to convince pt to stay and have further test/intervention done per MD recommendation. Pt indicated he does not want to have further intervention done. He states " I want to go home , I don't want to do heart catheterization. ". MD Nettey aware and have slao spoken at length with pt.
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
## 2017-05-17 NOTE — Discharge Summary (Signed)
AMA DISCHARGE SUMMARY  Hayden [name] QIH:[id number] DOB: 18-Apr-1971  PCP: [name], DO  Admit date: 05/16/2017 Discharge date: 05/17/2017  Time spent: 25 minutes    Admitted From: Home Disposition:  Home  Recommendations for Outpatient Follow-up: None. Patient left against medical Advice  Home Health:NONE  Equipment/Devices:None   Discharge Diagnoses:  Active Hospital Problems   Diagnosis Date Noted  . Complete heart block ([place]) 05/02/2017  . Type 2 diabetes mellitus with peripheral neuropathy (HCC)   . ESRD on dialysis ([place])   . 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 S/P femoral-popliteal bypass surgery   . Chronic systolic congestive heart failure/EF 40% PER echo 2010 09/22/2012  . Acute coronary syndrome ([place]) 09/22/2012  . Chest pain 09/21/2012  . Hypertensive urgency, malignant 09/21/2012    Resolved Hospital Problems  No resolved problems to display.    Discharge Condition: Guarded   Vitals:   05/17/17 0805 05/17/17 0902  BP: (!) 145/83   Pulse:  83  Resp:    Temp:  (!) 97.3 F (36.3 C)  SpO2:  98%    History of present illness:  Hayden [name] is a 46 y.o. year old male with medical history significant for CHB s/p pacemaker ( 05/2017), Paroxsymal atrial flutter on warfarin, PAD s/p left AKA, ESRD on HD, HTN, DM, prior CVA  who presented on 05/16/2017 with chest pain and was found to have NSTEMI. Patient left against medical advice  Remaining hospital course addressed in problem based format below:  Hospital Course:  Active Problems:   Hypertensive urgency, malignant   Chest pain   Acute coronary syndrome (HCC)   Chronic systolic congestive heart failure/EF 40% PER echo 2010   S/P femoral-popliteal bypass surgery   Above knee amputation of right lower extremity (HCC)   ESRD on dialysis ([place])   Type 2 diabetes mellitus with peripheral neuropathy ([place])   Complete heart block ([place])   ACS NSTEMI Patient was  initially admitted with chest pain in setting of mildly elevated troponin presume related to accelerated hypertension his blood pressure was 220/120.  Patient's pacemaker was evaluated by EP on admission and found to be functioning well.  CTA was negative for aortic dissection.  However patient's troponin continued to trend up over 5 with concern for ACS.  Heparin drip was started after consultation with cardiology overnight.  Patient was made n.p.o. as cardiology recommended for potential left heart catheterization for further workup for possible ischemic etiology.  Despite concerns for N STEMI patient was adamant that he would decline a left heart catheterization.  Both myself and on-call cardiologist explained to patient the risk associated with him leaving [place].  Patient understood risk and understood ultimate risk of potential death from fatal heart attack and wished to be discharged.  Patient was instructed to continue to take aspirin at home warfarin.  Hypertensive emergency Elevated blood pressure 220/120 upon admission.  Blood pressure improved with one-time dose of IV hydralazine.  Elevated troponin and chest pain presumably due to NSTEMI as mentioned above   Consultations: Cardiology, Dr. Lyman Bishop, Dr. Rayann Heman  Procedures/Studies: Dg Chest 2 View  Result Date: 05/06/2017 CLINICAL DATA:  Pacemaker placement. EXAM: CHEST  2 VIEW COMPARISON:  05/02/2017 FINDINGS: New permanent right-sided pacemaker with right atrial and right ventricular wires in good position and no complicating features. Stable cardiac enlargement and tortuous calcified thoracic aorta. Stable eventration right hemidiaphragm. Vascular congestion without overt pulmonary edema. No definite pleural effusions.  IMPRESSION: Pacer wires in good position without complicating features. Stable cardiac enlargement and vascular congestion. Electronically Signed   By: Marijo Sanes M.D.   On: 05/06/2017 08:31   Ct  Head Wo Contrast  Result Date: 05/16/2017 CLINICAL DATA:  Altered mental status EXAM: CT HEAD WITHOUT CONTRAST TECHNIQUE: Contiguous axial images were obtained from the base of the skull through the vertex without intravenous contrast. COMPARISON:  Brain MRI 06/19/2015 FINDINGS: Brain: No mass lesion, intraparenchymal hemorrhage or extra-axial collection. No evidence of acute cortical infarct. There are scattered cortical punctate calcifications. Old right basal ganglia lacunar infarct. Vascular: No hyperdense vessel or unexpected calcification. Hyperdensity of the venous sinuses is secondary to contrast administration for the earlier performed CTA of the chest, abdomen and pelvis. Skull: Normal visualized skull base, calvarium and extracranial soft tissues. Sinuses/Orbits: No sinus fluid levels or advanced mucosal thickening. No mastoid effusion. Normal orbits. IMPRESSION: Old right basal ganglia punctate lacunar infarct. No acute abnormality. Electronically Signed   By: Ulyses Jarred M.D.   On: 05/16/2017 22:54   Dg Chest Portable 1 View  Result Date: 05/16/2017 CLINICAL DATA:  Chest pain and shortness of Breath EXAM: PORTABLE CHEST 1 VIEW COMPARISON:  05/06/2017 FINDINGS: Cardiac shadow remains enlarged. Pacing device is again seen and stable. Aortic calcifications are again noted. The overall inspiratory effort is stable without focal infiltrate. No other focal abnormality is noted. IMPRESSION: No acute abnormality seen. Electronically Signed   By: Inez Catalina M.D.   On: 05/16/2017 17:55   Dg Chest Port 1 View  Result Date: 05/02/2017 CLINICAL DATA:  Bradycardia. EXAM: PORTABLE CHEST 1 VIEW COMPARISON:  CT 02/14/2017.  Chest x-ray 16 2018 . FINDINGS: Cardiomegaly with mild pulmonary vascular prominence. Interim partial resolution of pulmonary interstitial prominence consistent with partial clearing of CHF. No pleural effusion or pneumothorax. IMPRESSION: Interim partial clearing of pulmonary  interstitial edema. Electronically Signed   By: Marcello Moores  Register   On: 05/02/2017 13:50   Ct Angio Chest/abd/pel For Dissection W And/or Wo Contrast  Result Date: 05/16/2017 CLINICAL DATA:  Chest and back pain EXAM: CT ANGIOGRAPHY CHEST, ABDOMEN AND PELVIS TECHNIQUE: Multidetector CT imaging through the chest, abdomen and pelvis was performed using the standard protocol during bolus administration of intravenous contrast. Multiplanar reconstructed images and MIPs were obtained and reviewed to evaluate the vascular anatomy. CONTRAST:  132mL ISOVUE-370 IOPAMIDOL (ISOVUE-370) INJECTION 76% COMPARISON:  CT chest 02/14/2017, CT abdomen pelvis 810 2018, 10/30/2015 FINDINGS: CTA CHEST FINDINGS Cardiovascular: Non contrasted images of the chest demonstrate no evidence for intramural hematoma. Extensive atherosclerotic vascular calcification of the aorta. Calcifications at the origins of the great vessels, without high-grade stenosis. Extensive coronary artery calcification. Partially visualized intracardiac pacing leads. Mild cardiomegaly. Mitral annular calcification. No large pericardial effusion. Following contrast, no dissection is seen. No definitive filling defects in the central pulmonary vessels. Small amount of air in the pulmonary trunk likely due to venous injection. Mediastinum/Nodes: Midline trachea. Stable calcified nodule near the left thyroid isthmus. Re- demonstrated mediastinal adenopathy. Right paratracheal lymph node measures 16 mm compared with 15 mm previously. Lymph node adjacent to the aortic arch measures 7 mm, compared with 8 mm previously. Additional prominent lymph nodes in the mediastinum. Esophagus within normal limits Lungs/Pleura: Negative for pneumothorax, consolidation or effusion. Mild hazy densities could reflect diffuse atelectasis or mild edema. Musculoskeletal: No acute or suspicious lesion. Degenerative changes of the spine. Review of the MIP images confirms the above findings.  CTA ABDOMEN AND PELVIS FINDINGS VASCULAR Aorta: Extensive atherosclerotic  calcification of the aorta. No aneurysmal dilatation or dissection is seen. Celiac: Mild stenosis at the origin of the celiac artery. Diffuse calcifications throughout the splenic and hepatic artery. SMA: Moderate-to-marked stenosis at the origin of the SMA. Scattered calcifications along the course of the SMA but distal vessel patency and no occlusion. Renals: Single right and single left renal artery. Extensive calcification at the origin of both artery. Suspected moderate stenosis at the origin of the right renal artery. Moderate severe stenosis of the proximal left renal artery. IMA: Patent Inflow: Extensive calcification of the distal aorta and common iliac vessels. No focal stenosis. Moderate stenosis of the origin of right internal iliac vessel. Extensive diffuse calcification of the distal branch vessels of both internal iliac vessels. Mild diffuse disease of the right external iliac artery. There appears to be a partially visualize right femoral bypass graft which may be occluded. The native vessel demonstrates severe stenosis of the proximal SFA. Left external iliac vessel demonstrates mild diffuse disease but no high-grade stenosis. Mild stenosis of the proximal left SFA. Veins: No obvious venous abnormality within the limitations of this arterial phase study. Review of the MIP images confirms the above findings. NON-VASCULAR Hepatobiliary: No focal hepatic abnormality. Multiple calcifications at the gallbladder fossa post cholecystectomy likely representing old hematoma. No biliary dilatation Pancreas: Unremarkable. No pancreatic ductal dilatation or surrounding inflammatory changes. Spleen: Normal in size without focal abnormality. Adrenals/Urinary Tract: Adrenal glands are within normal limits. Atrophic appearing kidneys. No hydronephrosis. Bladder within normal limits. Stomach/Bowel: Stomach is nonenlarged. No dilated small  bowel. No colon wall thickening. Normal appendix. Scattered sigmoid colon diverticula. Lymphatic: No significantly enlarged lymph nodes Reproductive: Prostate is unremarkable. Other: Negative for free air. Tiny free fluid in the pelvis. Moderate diffuse subcutaneous edema. Fat in the umbilicus. Musculoskeletal: Degenerative changes. No acute or suspicious lesion Review of the MIP images confirms the above findings. IMPRESSION: 1. Negative for acute aortic dissection. No aneurysm. No obvious filling defect in the central pulmonary vessels 2. Extensive vascular calcification throughout the aorta and its branch vessels. Stenotic lesions involving the celiac, SMA, and renal vessels as detailed above. 3. Cardiomegaly with hazy pulmonary density which may reflect diffuse atelectasis or mild edema. 4. Stable mediastinal adenopathy 5. Small amount of free fluid in the pelvis.  Atrophic kidneys. Electronically Signed   By: Donavan Foil M.D.   On: 05/16/2017 21:10    (Echo, Carotid, EGD, Colonoscopy, ERCP)   Discharge Exam: BP (!) 145/83 (BP Location: Right Arm)   Pulse 83   Temp (!) 97.3 F (36.3 C) (Oral)   Resp 12   Ht 5\' 9"  (1.753 m)   Wt 97.1 kg (214 lb 1.1 oz)   SpO2 98%   BMI 31.61 kg/m   General: lying in bed comfortably, in no distress Cardiovascular: 3/6 SEM loudest at apex with radiation to axilla Respiratory: on room air, no respiratory distress Skin: Right BKA   Discharge Instructions You were cared for by a hospitalist during your hospital stay. If you have any questions about your discharge medications or the care you received while you were in the hospital after you are discharged, you can call the unit and asked to speak with the hospitalist on call if the hospitalist that took care of you is not available. Once you are discharged, your primary care physician will handle any further medical issues. Please note that NO REFILLS for any discharge medications will be authorized once you  are discharged, as it is imperative that you  return to your primary care physician (or establish a relationship with a primary care physician if you do not have one) for your aftercare needs so that they can reassess your need for medications and monitor your lab values.   Allergies as of 05/17/2017      Reactions   Dilaudid [hydromorphone Hcl] Other (See Comments)   ABNORMAL BEHAVIOR, "VERBALLY AND PHYSICALLY ABUSIVE," PSYCHOSIS   Morphine And Related Itching   Adhesive [tape] Other (See Comments)   Redness from adhesive tape if left on too long, paper tape is preferred    No Med Reconciliation as patient left against medical advice    Allergies  Allergen Reactions  . Dilaudid [Hydromorphone Hcl] Other (See Comments)    ABNORMAL BEHAVIOR, "VERBALLY AND PHYSICALLY ABUSIVE," PSYCHOSIS  . Morphine And Related Itching  . Adhesive [Tape] Other (See Comments)    Redness from adhesive tape if left on too long, paper tape is preferred      The results of significant diagnostics from this hospitalization (including imaging, microbiology, ancillary and laboratory) are listed below for reference.    Significant Diagnostic Studies: Dg Chest 2 View  Result Date: 05/06/2017 CLINICAL DATA:  Pacemaker placement. EXAM: CHEST  2 VIEW COMPARISON:  05/02/2017 FINDINGS: New permanent right-sided pacemaker with right atrial and right ventricular wires in good position and no complicating features. Stable cardiac enlargement and tortuous calcified thoracic aorta. Stable eventration right hemidiaphragm. Vascular congestion without overt pulmonary edema. No definite pleural effusions. IMPRESSION: Pacer wires in good position without complicating features. Stable cardiac enlargement and vascular congestion. Electronically Signed   By: Marijo Sanes M.D.   On: 05/06/2017 08:31   Ct Head Wo Contrast  Result Date: 05/16/2017 CLINICAL DATA:  Altered mental status EXAM: CT HEAD WITHOUT CONTRAST TECHNIQUE:  Contiguous axial images were obtained from the base of the skull through the vertex without intravenous contrast. COMPARISON:  Brain MRI 06/19/2015 FINDINGS: Brain: No mass lesion, intraparenchymal hemorrhage or extra-axial collection. No evidence of acute cortical infarct. There are scattered cortical punctate calcifications. Old right basal ganglia lacunar infarct. Vascular: No hyperdense vessel or unexpected calcification. Hyperdensity of the venous sinuses is secondary to contrast administration for the earlier performed CTA of the chest, abdomen and pelvis. Skull: Normal visualized skull base, calvarium and extracranial soft tissues. Sinuses/Orbits: No sinus fluid levels or advanced mucosal thickening. No mastoid effusion. Normal orbits. IMPRESSION: Old right basal ganglia punctate lacunar infarct. No acute abnormality. Electronically Signed   By: Ulyses Jarred M.D.   On: 05/16/2017 22:54   Dg Chest Portable 1 View  Result Date: 05/16/2017 CLINICAL DATA:  Chest pain and shortness of Breath EXAM: PORTABLE CHEST 1 VIEW COMPARISON:  05/06/2017 FINDINGS: Cardiac shadow remains enlarged. Pacing device is again seen and stable. Aortic calcifications are again noted. The overall inspiratory effort is stable without focal infiltrate. No other focal abnormality is noted. IMPRESSION: No acute abnormality seen. Electronically Signed   By: Inez Catalina M.D.   On: 05/16/2017 17:55   Dg Chest Port 1 View  Result Date: 05/02/2017 CLINICAL DATA:  Bradycardia. EXAM: PORTABLE CHEST 1 VIEW COMPARISON:  CT 02/14/2017.  Chest x-ray 16 2018 . FINDINGS: Cardiomegaly with mild pulmonary vascular prominence. Interim partial resolution of pulmonary interstitial prominence consistent with partial clearing of CHF. No pleural effusion or pneumothorax. IMPRESSION: Interim partial clearing of pulmonary interstitial edema. Electronically Signed   By: [place]   On: 05/02/2017 13:50   Ct Angio Chest/abd/pel For Dissection W  And/or Wo Contrast  Result Date: 05/16/2017 CLINICAL DATA:  Chest and back pain EXAM: CT ANGIOGRAPHY CHEST, ABDOMEN AND PELVIS TECHNIQUE: Multidetector CT imaging through the chest, abdomen and pelvis was performed using the standard protocol during bolus administration of intravenous contrast. Multiplanar reconstructed images and MIPs were obtained and reviewed to evaluate the vascular anatomy. CONTRAST:  175mL ISOVUE-370 IOPAMIDOL (ISOVUE-370) INJECTION 76% COMPARISON:  CT chest 02/14/2017, CT abdomen pelvis 810 2018, 10/30/2015 FINDINGS: CTA CHEST FINDINGS Cardiovascular: Non contrasted images of the chest demonstrate no evidence for intramural hematoma. Extensive atherosclerotic vascular calcification of the aorta. Calcifications at the origins of the great vessels, without high-grade stenosis. Extensive coronary artery calcification. Partially visualized intracardiac pacing leads. Mild cardiomegaly. Mitral annular calcification. No large pericardial effusion. Following contrast, no dissection is seen. No definitive filling defects in the central pulmonary vessels. Small amount of air in the pulmonary trunk likely due to venous injection. Mediastinum/Nodes: Midline trachea. Stable calcified nodule near the left thyroid isthmus. Re- demonstrated mediastinal adenopathy. Right paratracheal lymph node measures 16 mm compared with 15 mm previously. Lymph node adjacent to the aortic arch measures 7 mm, compared with 8 mm previously. Additional prominent lymph nodes in the mediastinum. Esophagus within normal limits Lungs/Pleura: Negative for pneumothorax, consolidation or effusion. Mild hazy densities could reflect diffuse atelectasis or mild edema. Musculoskeletal: No acute or suspicious lesion. Degenerative changes of the spine. Review of the MIP images confirms the above findings. CTA ABDOMEN AND PELVIS FINDINGS VASCULAR Aorta: Extensive atherosclerotic calcification of the aorta. No aneurysmal dilatation or  dissection is seen. Celiac: Mild stenosis at the origin of the celiac artery. Diffuse calcifications throughout the splenic and hepatic artery. SMA: Moderate-to-marked stenosis at the origin of the SMA. Scattered calcifications along the course of the SMA but distal vessel patency and no occlusion. Renals: Single right and single left renal artery. Extensive calcification at the origin of both artery. Suspected moderate stenosis at the origin of the right renal artery. Moderate severe stenosis of the proximal left renal artery. IMA: Patent Inflow: Extensive calcification of the distal aorta and common iliac vessels. No focal stenosis. Moderate stenosis of the origin of right internal iliac vessel. Extensive diffuse calcification of the distal branch vessels of both internal iliac vessels. Mild diffuse disease of the right external iliac artery. There appears to be a partially visualize right femoral bypass graft which may be occluded. The native vessel demonstrates severe stenosis of the proximal SFA. Left external iliac vessel demonstrates mild diffuse disease but no high-grade stenosis. Mild stenosis of the proximal left SFA. Veins: No obvious venous abnormality within the limitations of this arterial phase study. Review of the MIP images confirms the above findings. NON-VASCULAR Hepatobiliary: No focal hepatic abnormality. Multiple calcifications at the gallbladder fossa post cholecystectomy likely representing old hematoma. No biliary dilatation Pancreas: Unremarkable. No pancreatic ductal dilatation or surrounding inflammatory changes. Spleen: Normal in size without focal abnormality. Adrenals/Urinary Tract: Adrenal glands are within normal limits. Atrophic appearing kidneys. No hydronephrosis. Bladder within normal limits. Stomach/Bowel: Stomach is nonenlarged. No dilated small bowel. No colon wall thickening. Normal appendix. Scattered sigmoid colon diverticula. Lymphatic: No significantly enlarged lymph  nodes Reproductive: Prostate is unremarkable. Other: Negative for free air. Tiny free fluid in the pelvis. Moderate diffuse subcutaneous edema. Fat in the umbilicus. Musculoskeletal: Degenerative changes. No acute or suspicious lesion Review of the MIP images confirms the above findings. IMPRESSION: 1. Negative for acute aortic dissection. No aneurysm. No obvious filling defect in the central pulmonary vessels 2. Extensive vascular calcification throughout the  aorta and its branch vessels. Stenotic lesions involving the celiac, SMA, and renal vessels as detailed above. 3. Cardiomegaly with hazy pulmonary density which may reflect diffuse atelectasis or mild edema. 4. Stable mediastinal adenopathy 5. Small amount of free fluid in the pelvis.  Atrophic kidneys. Electronically Signed   By: Donavan Foil M.D.   On: 05/16/2017 21:10    Microbiology: Recent Results (from the past 240 hour(s))  MRSA PCR Screening     Status: None   Collection Time: 05/16/17  9:38 PM  Result Value Ref Range Status   MRSA by PCR NEGATIVE NEGATIVE Final    Comment:        The GeneXpert MRSA Assay (FDA approved for NASAL specimens only), is one component of a comprehensive MRSA colonization surveillance program. It is not intended to diagnose MRSA infection nor to guide or monitor treatment for MRSA infections.      Labs: Basic Metabolic Panel: Recent Labs  Lab 05/16/17 1725 05/17/17 0230  NA 134* 137  K 4.4 4.6  CL 100* 99*  CO2  --  24  GLUCOSE 133* 115*  BUN 61* 71*  CREATININE 6.30* 7.53*  CALCIUM  --  9.5   Liver Function Tests: No results for input(s): AST, ALT, ALKPHOS, BILITOT, PROT, ALBUMIN in the last 168 hours. No results for input(s): LIPASE, AMYLASE in the last 168 hours. Recent Labs  Lab 05/16/17 2147  AMMONIA 49*   CBC: Recent Labs  Lab 05/16/17 1725 05/16/17 1748 05/17/17 0230  WBC  --  10.9* 9.7  NEUTROABS  --  8.4*  --   HGB 11.6* 10.6* 9.8*  HCT 34.0* 33.7* 31.6*  MCV   --  82.2 82.1  PLT  --  415* 386   Cardiac Enzymes: Recent Labs  Lab 05/16/17 1708 05/16/17 2147 05/17/17 0230 05/17/17 0918  TROPONINI 0.77* 3.08* 5.32* 5.72*   BNP: BNP (last 3 results) No results for input(s): BNP in the last 8760 hours.  ProBNP (last 3 results) No results for input(s): PROBNP in the last 8760 hours.  CBG: No results for input(s): GLUCAP in the last 168 hours.     Signed:  Desiree Hane, MD [name] 05/17/2017, 3:53 PM

## 2017-05-19 ENCOUNTER — Ambulatory Visit: Payer: Self-pay

## 2017-05-20 ENCOUNTER — Telehealth: Payer: Self-pay | Admitting: Cardiology

## 2017-05-20 ENCOUNTER — Ambulatory Visit: Payer: Self-pay

## 2017-05-20 NOTE — Telephone Encounter (Signed)
Spoke with patient extensively regarding upcoming appointments. Patient states that he called for an appointment with Dr. Ellyn Hack but received an appointment with Dr. Curt Bears for 11/20. I explained that he did not need that appointment but that he did need to come in and have his wound check. I offered to have him come into the [address] [facility] for a wound check at 0945 - in substitution of his previous appointment with Dr. Curt Bears. Patient was agreeable to this.

## 2017-05-20 NOTE — Telephone Encounter (Signed)
New Message     Per wife pt has dialysis can not come in today, needs to reschedule , call after 3pm

## 2017-05-21 ENCOUNTER — Ambulatory Visit (INDEPENDENT_AMBULATORY_CARE_PROVIDER_SITE_OTHER): Payer: Medicare Other | Admitting: *Deleted

## 2017-05-21 ENCOUNTER — Telehealth: Payer: Self-pay | Admitting: Cardiology

## 2017-05-21 ENCOUNTER — Ambulatory Visit: Payer: Self-pay | Admitting: Cardiology

## 2017-05-21 DIAGNOSIS — I442 Atrioventricular block, complete: Secondary | ICD-10-CM

## 2017-05-21 NOTE — Telephone Encounter (Signed)
Called patient, got verbal OK to speak with wife. Explained to wife why heart cath was recommended based on elevated troponin and coronary risk factors. Explained need to stay on ASA. Advised ED eval for recurrent chest pain symptoms. Keep MD follow up on 12/3  Routed to MD as Juluis Rainier

## 2017-05-21 NOTE — Telephone Encounter (Signed)
New message   Patient spouse calling with questions regarding a heart cath. Wants clarification if a heart cath is needed. Patient left  Zacarias Pontes AMA on 11/17. Please call

## 2017-05-26 ENCOUNTER — Other Ambulatory Visit: Payer: Self-pay | Admitting: Physician Assistant

## 2017-05-26 MED ORDER — ISOSORBIDE DINITRATE 10 MG PO TABS: 10.0000 mg | ORAL_TABLET | Freq: Two times a day (BID) | ORAL | 1 refills | Status: DC

## 2017-05-26 NOTE — Telephone Encounter (Signed)
Please review for refill, thanks ! 

## 2017-05-26 NOTE — Telephone Encounter (Signed)
Unable to reach patient, put note in rx for pharmacy to inform patient that med instructions has changed.

## 2017-05-26 NOTE — Telephone Encounter (Signed)
REFILL 

## 2017-05-27 NOTE — Telephone Encounter (Signed)
All this happened since I saw him in clinic & was not read in on his NSTEMI / HTN Emergency admission.  It would make sense to consider cath given ++ Troponin.  Not sure who he is seening on 12/3.  Glenetta Hew, MD

## 2017-06-02 ENCOUNTER — Ambulatory Visit: Payer: Medicare Other | Admitting: Cardiology

## 2017-06-03 ENCOUNTER — Other Ambulatory Visit: Payer: Self-pay | Admitting: Cardiology

## 2017-06-03 ENCOUNTER — Ambulatory Visit (INDEPENDENT_AMBULATORY_CARE_PROVIDER_SITE_OTHER): Payer: Medicare Other | Admitting: Cardiology

## 2017-06-03 ENCOUNTER — Encounter: Payer: Self-pay | Admitting: Cardiology

## 2017-06-03 VITALS — BP 142/88 | HR 76 | Ht 69.0 in | Wt 213.0 lb

## 2017-06-03 DIAGNOSIS — I442 Atrioventricular block, complete: Secondary | ICD-10-CM

## 2017-06-03 DIAGNOSIS — I4892 Unspecified atrial flutter: Secondary | ICD-10-CM | POA: Diagnosis not present

## 2017-06-03 DIAGNOSIS — I249 Acute ischemic heart disease, unspecified: Secondary | ICD-10-CM

## 2017-06-03 DIAGNOSIS — Z01818 Encounter for other preprocedural examination: Secondary | ICD-10-CM | POA: Diagnosis not present

## 2017-06-03 DIAGNOSIS — I200 Unstable angina: Secondary | ICD-10-CM | POA: Diagnosis not present

## 2017-06-03 DIAGNOSIS — I5032 Chronic diastolic (congestive) heart failure: Secondary | ICD-10-CM

## 2017-06-03 DIAGNOSIS — I110 Hypertensive heart disease with heart failure: Secondary | ICD-10-CM | POA: Diagnosis not present

## 2017-06-03 DIAGNOSIS — Z720 Tobacco use: Secondary | ICD-10-CM | POA: Diagnosis not present

## 2017-06-03 DIAGNOSIS — I160 Hypertensive urgency: Secondary | ICD-10-CM

## 2017-06-03 DIAGNOSIS — E785 Hyperlipidemia, unspecified: Secondary | ICD-10-CM

## 2017-06-03 LAB — CUP PACEART INCLINIC DEVICE CHECK
Battery Remaining Percentage: 95 %
Battery Voltage: 3.02 V
Brady Statistic RA Percent Paced: 1 % — CL
Brady Statistic RV Percent Paced: 99 %
Implantable Lead Location: 753860
Implantable Pulse Generator Implant Date: 20181105
Lead Channel Impedance Value: 510 Ohm
Lead Channel Pacing Threshold Amplitude: 0.75 V
Lead Channel Sensing Intrinsic Amplitude: 5 mV
Lead Channel Setting Pacing Amplitude: 3.5 V
Lead Channel Setting Pacing Pulse Width: 0.5 ms
MDC IDC LEAD IMPLANT DT: 20181105
MDC IDC LEAD IMPLANT DT: 20181105
MDC IDC LEAD LOCATION: 753859
MDC IDC MSMT BATTERY REMAINING LONGEVITY: 55 mo
MDC IDC MSMT LEADCHNL RA PACING THRESHOLD PULSEWIDTH: 0.5 ms
MDC IDC MSMT LEADCHNL RV IMPEDANCE VALUE: 540 Ohm
MDC IDC MSMT LEADCHNL RV PACING THRESHOLD AMPLITUDE: 0.75 V
MDC IDC MSMT LEADCHNL RV PACING THRESHOLD PULSEWIDTH: 0.5 ms
MDC IDC PG SERIAL: 8959303
MDC IDC SESS DTM: 20181204125105
MDC IDC SET LEADCHNL RA PACING AMPLITUDE: 3.5 V
MDC IDC SET LEADCHNL RV SENSING SENSITIVITY: 2.5 mV
Pulse Gen Model: 2272

## 2017-06-03 MED ORDER — CARVEDILOL 25 MG PO TABS: 25.0000 mg | ORAL_TABLET | Freq: Two times a day (BID) | ORAL | 3 refills | Status: DC

## 2017-06-03 MED ORDER — PRAVASTATIN SODIUM 40 MG PO TABS: 40.0000 mg | ORAL_TABLET | Freq: Every evening | ORAL | 3 refills | Status: DC

## 2017-06-03 MED ORDER — METOPROLOL TARTRATE 50 MG PO TABS: 50.0000 mg | ORAL_TABLET | Freq: Once | ORAL | 0 refills | Status: DC

## 2017-06-03 NOTE — Progress Notes (Signed)
Wound check appointment. Steri-strips removed. Wound without redness or edema. Incision edges approximated, wound well healed. Normal device function. Thresholds, sensing, and impedances consistent with implant measurements. Device programmed at 3.5V (RA) and 5V (RV) for extra safety margin until 3 month visit. Histogram distribution appropriate for patient and level of activity. No mode switches or high ventricular rates noted. Patient educated about wound care, arm mobility, lifting restrictions. ROV in 3 months with WC.

## 2017-06-03 NOTE — Patient Instructions (Addendum)
    INSTRUCTIONS FOR  CORONARY CTA    Please arrive at the [facility] main entrance of [facility] at (30-45 minutes prior to test start time)  [facility] [place], [place] [address] [phone]  Proceed to the [facility] Radiology Department (First Floor).  Please follow these instructions carefully (unless otherwise directed):  PLEASE HAVE LABS - BMP  AT LEAST ONE WEEK PRIOR TO TEST  On the Night Before the Test: . Drink plenty of water. . Do not consume any caffeinated/decaffeinated beverages or chocolate 12 hours prior to your test. . Do not take any antihistamines 12 hours prior to your test.    On the Day of the Test: . Drink plenty of water. Do not drink any water within one hour of the test. . Do not eat any food 4 hours prior to the test. . You may take your regular medications prior to the test. . IF NOT ON A BETA BLOCKER - Take 50 mg of lopressor (metoprolol) one hour before the test.   After the Test: . Drink plenty of water. . After receiving IV contrast, you may experience a mild flushed feeling. This is normal. . On occasion, you may experience a mild rash up to 24 hours after the test. This is not dangerous. If this occurs, you can take Benadryl 25 mg and increase your fluid intake. . If you experience trouble breathing, this can be serious. If it is severe call 911 IMMEDIATELY. If it is mild, please call our office.     MEDICATION CHANGES   ---INCREASE CARVEDILOL TO 25 MG TWICE  A DAY  ---INCREASE PRAVACHOL 40 MG ONE TABLET DAILY.      SCHEDULE AT HOSPITAL  Your physician has requested that you have cardiac CT. Cardiac computed tomography (CT) is a painless test that uses an x-ray machine to take clear, detailed pictures of your heart. For further information please visit [email or web address]. Please follow instruction sheet as given.  PLEASE FOLLOW INSTRUCTIONS    Your physician recommends that you  schedule a follow-up appointment in 2 [place].

## 2017-06-03 NOTE — Progress Notes (Signed)
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 does not have long to live and does not see the virtue in stopping something to give him pleasure.       Other Visit Diagnoses    Pre-op testing       Relevant Orders   Basic metabolic panel   CT CORONARY MORPH W/CTA COR W/SCORE W/CA W/CM &/OR WO/CM   CT CORONARY FRACTIONAL FLOW RESERVE DATA PREP   CT CORONARY FRACTIONAL FLOW RESERVE FLUID ANALYSIS      Current medicines are reviewed at length with the patient today. (+/- concerns) n/a  The following changes have been made: increase Carvedilol to 25 mg BID., Pravachol to 40 mg   Patient Instructions   PLEASE HAVE LABS - BMP  AT LEAST ONE WEEK PRIOR TO TEST    MEDICATION CHANGES   ---INCREASE CARVEDILOL TO 25 MG TWICE  A DAY  ---INCREASE PRAVACHOL 40 MG ONE TABLET DAILY.  SCHEDULE AT HOSPITAL  Your physician has requested that you have cardiac CT. Cardiac computed tomography (CT) is a painless test that uses an x-ray machine to take clear, detailed pictures of your heart. For further information please visit [email or web address]. Please follow instruction sheet as given.  Your physician recommends that you schedule a follow-up appointment in 2  [place].  Studies Ordered:   Orders Placed This Encounter  Procedures  . CT CORONARY MORPH W/CTA COR W/SCORE W/CA W/CM &/OR WO/CM  . CT CORONARY FRACTIONAL FLOW RESERVE DATA PREP  . CT CORONARY FRACTIONAL FLOW RESERVE FLUID ANALYSIS  . Basic metabolic panel      Glenetta Hew, M.D., M.S. Interventional Cardiologist   Pager # [phone] Phone # [phone] [address]. [place], [place] [address]

## 2017-06-04 ENCOUNTER — Encounter: Payer: Self-pay | Admitting: Cardiology

## 2017-06-04 NOTE — Assessment & Plan Note (Addendum)
With severe PAD and likely CAD, his goal LDL is less than 70.  Most recently was 86.  Need to push harder. We will increase pravastatin to 40 mrem daily.  If that does not work, will need to convert to [employer]. Recheck lipids in roughly 3 months before I see him back.

## 2017-06-04 NOTE — Assessment & Plan Note (Signed)
Positive troponin in the setting of hypertensive emergency thought to be possibly related to acute coronary syndrome versus simply demand ischemia.  This was called non-STEMI in the hospital.  He declined cardiac catheterization. Plan: Per his request we are proceeding with coronary CT angiogram to exclude obstructive CAD. He does understand that if this is abnormal, then we would consider invasive cardiac catheterization.  His major concern is that he knows he has had femoral access, and has had issues with femoral arterial access hematoma in the past.  He is just almost has PTSD thinking about this..  Plan: Check CT coronary angiogram with possible CT FFR. Continue aspirin without Plavix because of his warfarin for now. We will increase pravastatin to 40 mg daily. Increase carvedilol to 12.5 mg twice daily in addition to other medications.

## 2017-06-04 NOTE — Assessment & Plan Note (Signed)
I honestly think that is admission with positive troponin was a malignant hypertension episode and potentially the troponin elevation was related to his hypertensive heart disease with hypertensive crisis. However we need to exclude coronary ischemia since he has had 2 intermediate risk stress test.  We talked about invasive versus noninvasive approaches.  He begged for a noninvasive approach understanding that it is still not a definitive evaluation, he indicated that he would prefer coronary CT angiogram with possible CT FFR to invasive cardiac catheterization.  He does understand that if this study is abnormal, then cardiac catheterization is in his future.

## 2017-06-04 NOTE — Assessment & Plan Note (Addendum)
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 Seems relatively euvolemic today although he is having some edema.  I think this may just be positional. Continue clonidine, amlodipine and irbesartan at current doses.  Also continue hydralazine with isosorbide dinitrate.   Plan: Increase carvedilol to 25 mg twice daily.

## 2017-06-04 NOTE — Assessment & Plan Note (Signed)
Status post pacemaker placement.  We are now free and to use AV nodal agents such as carvedilol and clonidine.  Rate notably improved today.

## 2017-06-04 NOTE — Assessment & Plan Note (Signed)
Currently cutting back how much she is smoking, but still needs to fully quit.  I do not think he is quite ready to do that.  Would probably need some assistance.  For now he is not ready to deal with the mental anguish of stopping smoking.  I actually think, smoking gives him pleasure and with his much illness he has he knows that he does not have long to live and does not see the virtue in stopping something to give him pleasure.

## 2017-06-04 NOTE — Assessment & Plan Note (Signed)
Thankfully, no recurrence since his hospitalization.  Clonidine and carvedilol both for blood pressure and now for rate control as well.  He has not had any further symptoms, we will need to monitor for signs of tachycardia. Continue warfarin for anti-coagulation.

## 2017-06-11 ENCOUNTER — Telehealth (INDEPENDENT_AMBULATORY_CARE_PROVIDER_SITE_OTHER): Payer: Self-pay | Admitting: Radiology

## 2017-06-11 DIAGNOSIS — Z89611 Acquired absence of right leg above knee: Secondary | ICD-10-CM

## 2017-06-11 DIAGNOSIS — G894 Chronic pain syndrome: Secondary | ICD-10-CM

## 2017-06-11 NOTE — Telephone Encounter (Signed)
I called and left voicemail, it was his wife's Kimberly's phone, advised we were working on pain management referral for him. Unfortunately it was declined. A new referral will be sent to another office. These are sadly a long process and sometimes to no avail. In meantime Dr. Sharol Given would like to increase neurontin to 300mg  tid. Advised if he is interested please let us know and we will send new neurontin rx into pharmacy he would like. He has multiple pharmacies in the chart.

## 2017-06-11 NOTE — Telephone Encounter (Signed)
-----   Message from Newt Minion, MD sent at 06/10/2017  1:25 PM EST ----- Have him increase his Neurontin to 300 mg 3 times a day and we will try for another pain clinic. ----- Message ----- From: Maxcine Ham, RT Sent: 06/05/2017   2:54 PM To: Newt Minion, MD, Suzan Slick, NP  Patient was denied by pain management. Per review this patient no showed to their office after hospital follow IP will not accept for treatment. What else would you like to do.

## 2017-06-16 ENCOUNTER — Telehealth (INDEPENDENT_AMBULATORY_CARE_PROVIDER_SITE_OTHER): Payer: Self-pay | Admitting: Orthopedic Surgery

## 2017-06-16 NOTE — Telephone Encounter (Signed)
40 --1 p.o. every 6 hours as needed for pain

## 2017-06-16 NOTE — Telephone Encounter (Signed)
Patients wife called stating that patients kidney doctor said he does not need to increase the neurontin due to ESRD. Is there another medication that can be prescribed? She was also curious about why he was declined at pain management and if that is usually a problem. She is also checking status of the new referral sent. Please give her a call back when possible # [phone]

## 2017-06-16 NOTE — Telephone Encounter (Signed)
Patient was declined from pain management. We are referring to [facility]. Takes estimated 6 weeks for chart review. Offered neurontin in meantime, but kidney doctor declined due to ESRD. Is there another medication that can be prescribed?

## 2017-06-16 NOTE — Telephone Encounter (Signed)
I called and left voicemail advised that unfortunately it is very common for patient to be turned down from pain management. This maybe to limited treatment options, patient volume, we are referring him to [facility]. Advised that pain management is a hard referral, often charts take 6 weeks to just review due to the high volume of incoming referrals across the board. And at the end of review after all that time has passed than you find out of a patient can be accepted, and sadly this is just the nature of the beast when trying to get someone pain management placement. Advised tramadol can be sent in we just need to know the pharmacy we can send this into for him.

## 2017-06-16 NOTE — Telephone Encounter (Signed)
error 

## 2017-06-16 NOTE — Telephone Encounter (Signed)
See if he can take tramadol.  If he can take this medication we could call it in for him

## 2017-06-17 ENCOUNTER — Telehealth (INDEPENDENT_AMBULATORY_CARE_PROVIDER_SITE_OTHER): Payer: Self-pay | Admitting: Radiology

## 2017-06-17 MED ORDER — TRAMADOL HCL 50 MG PO TABS: 50.0000 mg | ORAL_TABLET | Freq: Four times a day (QID) | ORAL | 0 refills | Status: DC | PRN

## 2017-06-17 NOTE — Telephone Encounter (Signed)
Will call into his pharmacy once they open after 9am.

## 2017-06-17 NOTE — Telephone Encounter (Signed)
-----   Message from Newt Minion, MD sent at 06/16/2017  4:40 PM EST -----   ----- Message ----- From: Maxcine Ham, RT Sent: 06/16/2017   4:32 PM To: Newt Minion, MD  Patient called back ok for tramadol, how many did you want to do? Uses [facility] [place].

## 2017-06-17 NOTE — Telephone Encounter (Signed)
Will call in tramadol per md 1 po q 6 hrs #40 no refills. Pharmacy does not open until 9am.

## 2017-06-18 ENCOUNTER — Telehealth (INDEPENDENT_AMBULATORY_CARE_PROVIDER_SITE_OTHER): Payer: Self-pay | Admitting: Radiology

## 2017-06-18 NOTE — Telephone Encounter (Signed)
Dr Truman Hayward called and inquired why patient was denied pain management.  I advised per referral that Dr Letta Pate denied due to patient having no showed an appointment with them.  I also advised her that patient has now been referred to [facility] management.  She asks what pain medication patient has been given, I advised tramadol yesterday, and no record of hydrocodone or oxycodone in chart.

## 2017-06-30 ENCOUNTER — Telehealth: Payer: Self-pay | Admitting: Pharmacist

## 2017-06-30 NOTE — Telephone Encounter (Signed)
This number is for Ms Hayden [name]. Left message for patient to call back.  Last INR check done 05/09/2017 with next schedule on 05/15/2017 (no show).  INR check overdue.

## 2017-07-09 ENCOUNTER — Other Ambulatory Visit: Payer: Self-pay

## 2017-07-09 DIAGNOSIS — T82598A Other mechanical complication of other cardiac and vascular devices and implants, initial encounter: Secondary | ICD-10-CM

## 2017-07-09 DIAGNOSIS — Z992 Dependence on renal dialysis: Principal | ICD-10-CM

## 2017-07-09 DIAGNOSIS — N186 End stage renal disease: Secondary | ICD-10-CM

## 2017-07-10 ENCOUNTER — Encounter (HOSPITAL_COMMUNITY): Payer: Self-pay

## 2017-07-10 ENCOUNTER — Other Ambulatory Visit: Payer: Self-pay

## 2017-07-10 NOTE — Telephone Encounter (Signed)
Patient no show for INR check and not answering phone calls.

## 2017-07-11 ENCOUNTER — Ambulatory Visit: Payer: Self-pay | Admitting: Vascular Surgery

## 2017-07-17 ENCOUNTER — Other Ambulatory Visit: Payer: Self-pay | Admitting: Cardiology

## 2017-07-17 MED ORDER — WARFARIN SODIUM 5 MG PO TABS: ORAL_TABLET | ORAL | 0 refills | Status: DC

## 2017-07-17 NOTE — Telephone Encounter (Signed)
Spoke with wife, patient hasn't had med for about 1 week.  Will fill enough to last, scheduled appointment for next Friday.  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    Recommend that he restart today with 7.5 mg qod alternating with 5 mg qod.   Wife voiced understanding.

## 2017-07-18 ENCOUNTER — Telehealth (INDEPENDENT_AMBULATORY_CARE_PROVIDER_SITE_OTHER): Payer: Self-pay | Admitting: Family

## 2017-07-18 DIAGNOSIS — R269 Unspecified abnormalities of gait and mobility: Secondary | ICD-10-CM

## 2017-07-18 DIAGNOSIS — Z89611 Acquired absence of right leg above knee: Secondary | ICD-10-CM

## 2017-07-18 NOTE — Telephone Encounter (Signed)
Per Junie Panning needs order for [facility] for PT. Placed for workque.

## 2017-07-19 ENCOUNTER — Other Ambulatory Visit: Payer: Self-pay

## 2017-07-19 ENCOUNTER — Emergency Department (HOSPITAL_COMMUNITY): Payer: Medicare Other

## 2017-07-19 ENCOUNTER — Inpatient Hospital Stay (HOSPITAL_COMMUNITY)
Admission: EM | Admit: 2017-07-19 | Discharge: 2017-08-05 | DRG: 602 | Disposition: A | Discharge status: Rehabilitation Facility | Payer: Medicare Other | Attending: Internal Medicine | Admitting: Internal Medicine

## 2017-07-19 DIAGNOSIS — I76 Septic arterial embolism: Secondary | ICD-10-CM | POA: Diagnosis not present

## 2017-07-19 DIAGNOSIS — Z95820 Peripheral vascular angioplasty status with implants and grafts: Secondary | ICD-10-CM

## 2017-07-19 DIAGNOSIS — H548 Legal blindness, as defined in USA: Secondary | ICD-10-CM | POA: Diagnosis present

## 2017-07-19 DIAGNOSIS — R197 Diarrhea, unspecified: Secondary | ICD-10-CM | POA: Diagnosis not present

## 2017-07-19 DIAGNOSIS — Z993 Dependence on wheelchair: Secondary | ICD-10-CM

## 2017-07-19 DIAGNOSIS — Z833 Family history of diabetes mellitus: Secondary | ICD-10-CM

## 2017-07-19 DIAGNOSIS — E46 Unspecified protein-calorie malnutrition: Secondary | ICD-10-CM | POA: Diagnosis present

## 2017-07-19 DIAGNOSIS — I252 Old myocardial infarction: Secondary | ICD-10-CM

## 2017-07-19 DIAGNOSIS — E875 Hyperkalemia: Secondary | ICD-10-CM | POA: Diagnosis present

## 2017-07-19 DIAGNOSIS — I272 Pulmonary hypertension, unspecified: Secondary | ICD-10-CM | POA: Diagnosis present

## 2017-07-19 DIAGNOSIS — G8929 Other chronic pain: Secondary | ICD-10-CM | POA: Diagnosis present

## 2017-07-19 DIAGNOSIS — E1152 Type 2 diabetes mellitus with diabetic peripheral angiopathy with gangrene: Secondary | ICD-10-CM

## 2017-07-19 DIAGNOSIS — R079 Chest pain, unspecified: Secondary | ICD-10-CM

## 2017-07-19 DIAGNOSIS — Z7901 Long term (current) use of anticoagulants: Secondary | ICD-10-CM

## 2017-07-19 DIAGNOSIS — D631 Anemia in chronic kidney disease: Secondary | ICD-10-CM | POA: Diagnosis present

## 2017-07-19 DIAGNOSIS — Z885 Allergy status to narcotic agent status: Secondary | ICD-10-CM

## 2017-07-19 DIAGNOSIS — L02416 Cutaneous abscess of left lower limb: Secondary | ICD-10-CM | POA: Diagnosis present

## 2017-07-19 DIAGNOSIS — E1151 Type 2 diabetes mellitus with diabetic peripheral angiopathy without gangrene: Secondary | ICD-10-CM | POA: Diagnosis present

## 2017-07-19 DIAGNOSIS — I080 Rheumatic disorders of both mitral and aortic valves: Secondary | ICD-10-CM | POA: Diagnosis present

## 2017-07-19 DIAGNOSIS — G43909 Migraine, unspecified, not intractable, without status migrainosus: Secondary | ICD-10-CM | POA: Diagnosis present

## 2017-07-19 DIAGNOSIS — I5043 Acute on chronic combined systolic (congestive) and diastolic (congestive) heart failure: Secondary | ICD-10-CM | POA: Diagnosis not present

## 2017-07-19 DIAGNOSIS — N186 End stage renal disease: Secondary | ICD-10-CM

## 2017-07-19 DIAGNOSIS — Z89611 Acquired absence of right leg above knee: Secondary | ICD-10-CM

## 2017-07-19 DIAGNOSIS — L958 Other vasculitis limited to the skin: Secondary | ICD-10-CM | POA: Diagnosis not present

## 2017-07-19 DIAGNOSIS — E1139 Type 2 diabetes mellitus with other diabetic ophthalmic complication: Secondary | ICD-10-CM | POA: Diagnosis present

## 2017-07-19 DIAGNOSIS — I6340 Cerebral infarction due to embolism of unspecified cerebral artery: Secondary | ICD-10-CM | POA: Diagnosis not present

## 2017-07-19 DIAGNOSIS — I442 Atrioventricular block, complete: Secondary | ICD-10-CM | POA: Diagnosis present

## 2017-07-19 DIAGNOSIS — E669 Obesity, unspecified: Secondary | ICD-10-CM | POA: Diagnosis present

## 2017-07-19 DIAGNOSIS — E1122 Type 2 diabetes mellitus with diabetic chronic kidney disease: Secondary | ICD-10-CM | POA: Diagnosis present

## 2017-07-19 DIAGNOSIS — Z905 Acquired absence of kidney: Secondary | ICD-10-CM

## 2017-07-19 DIAGNOSIS — Z85528 Personal history of other malignant neoplasm of kidney: Secondary | ICD-10-CM

## 2017-07-19 DIAGNOSIS — Z9119 Patient's noncompliance with other medical treatment and regimen: Secondary | ICD-10-CM

## 2017-07-19 DIAGNOSIS — F17210 Nicotine dependence, cigarettes, uncomplicated: Secondary | ICD-10-CM | POA: Diagnosis present

## 2017-07-19 DIAGNOSIS — Z95828 Presence of other vascular implants and grafts: Secondary | ICD-10-CM

## 2017-07-19 DIAGNOSIS — L03116 Cellulitis of left lower limb: Secondary | ICD-10-CM | POA: Diagnosis not present

## 2017-07-19 DIAGNOSIS — I6523 Occlusion and stenosis of bilateral carotid arteries: Secondary | ICD-10-CM | POA: Diagnosis present

## 2017-07-19 DIAGNOSIS — E11319 Type 2 diabetes mellitus with unspecified diabetic retinopathy without macular edema: Secondary | ICD-10-CM | POA: Diagnosis present

## 2017-07-19 DIAGNOSIS — Z6831 Body mass index (BMI) 31.0-31.9, adult: Secondary | ICD-10-CM

## 2017-07-19 DIAGNOSIS — Z515 Encounter for palliative care: Secondary | ICD-10-CM

## 2017-07-19 DIAGNOSIS — Z66 Do not resuscitate: Secondary | ICD-10-CM | POA: Diagnosis not present

## 2017-07-19 DIAGNOSIS — Z7189 Other specified counseling: Secondary | ICD-10-CM

## 2017-07-19 DIAGNOSIS — I2510 Atherosclerotic heart disease of native coronary artery without angina pectoris: Secondary | ICD-10-CM | POA: Diagnosis present

## 2017-07-19 DIAGNOSIS — F319 Bipolar disorder, unspecified: Secondary | ICD-10-CM | POA: Diagnosis present

## 2017-07-19 DIAGNOSIS — Z950 Presence of cardiac pacemaker: Secondary | ICD-10-CM

## 2017-07-19 DIAGNOSIS — G9341 Metabolic encephalopathy: Secondary | ICD-10-CM | POA: Diagnosis not present

## 2017-07-19 DIAGNOSIS — I350 Nonrheumatic aortic (valve) stenosis: Secondary | ICD-10-CM

## 2017-07-19 DIAGNOSIS — Z8673 Personal history of transient ischemic attack (TIA), and cerebral infarction without residual deficits: Secondary | ICD-10-CM

## 2017-07-19 DIAGNOSIS — Z8249 Family history of ischemic heart disease and other diseases of the circulatory system: Secondary | ICD-10-CM

## 2017-07-19 DIAGNOSIS — I7025 Atherosclerosis of native arteries of other extremities with ulceration: Secondary | ICD-10-CM | POA: Diagnosis present

## 2017-07-19 DIAGNOSIS — G40909 Epilepsy, unspecified, not intractable, without status epilepticus: Secondary | ICD-10-CM | POA: Diagnosis present

## 2017-07-19 DIAGNOSIS — I132 Hypertensive heart and chronic kidney disease with heart failure and with stage 5 chronic kidney disease, or end stage renal disease: Secondary | ICD-10-CM | POA: Diagnosis present

## 2017-07-19 DIAGNOSIS — E785 Hyperlipidemia, unspecified: Secondary | ICD-10-CM | POA: Diagnosis present

## 2017-07-19 DIAGNOSIS — I4892 Unspecified atrial flutter: Secondary | ICD-10-CM | POA: Diagnosis present

## 2017-07-19 DIAGNOSIS — Z992 Dependence on renal dialysis: Secondary | ICD-10-CM

## 2017-07-19 DIAGNOSIS — Z9049 Acquired absence of other specified parts of digestive tract: Secondary | ICD-10-CM

## 2017-07-19 DIAGNOSIS — S78111A Complete traumatic amputation at level between right hip and knee, initial encounter: Secondary | ICD-10-CM

## 2017-07-19 DIAGNOSIS — I255 Ischemic cardiomyopathy: Secondary | ICD-10-CM | POA: Diagnosis present

## 2017-07-19 DIAGNOSIS — M898X9 Other specified disorders of bone, unspecified site: Secondary | ICD-10-CM | POA: Diagnosis present

## 2017-07-19 DIAGNOSIS — R791 Abnormal coagulation profile: Secondary | ICD-10-CM | POA: Diagnosis present

## 2017-07-19 DIAGNOSIS — E11649 Type 2 diabetes mellitus with hypoglycemia without coma: Secondary | ICD-10-CM | POA: Diagnosis not present

## 2017-07-19 DIAGNOSIS — Z91048 Other nonmedicinal substance allergy status: Secondary | ICD-10-CM

## 2017-07-19 LAB — COMPREHENSIVE METABOLIC PANEL
ALBUMIN: 3 g/dL — AB (ref 3.5–5.0)
ALK PHOS: 173 U/L — AB (ref 38–126)
ALT: 13 U/L — AB (ref 17–63)
ANION GAP: 16 — AB (ref 5–15)
AST: 14 U/L — ABNORMAL LOW (ref 15–41)
BUN: 63 mg/dL — ABNORMAL HIGH (ref 6–20)
CALCIUM: 9.2 mg/dL (ref 8.9–10.3)
CHLORIDE: 99 mmol/L — AB (ref 101–111)
CO2: 23 mmol/L (ref 22–32)
CREATININE: 7.97 mg/dL — AB (ref 0.61–1.24)
GFR calc Af Amer: 8 mL/min — ABNORMAL LOW (ref >60)
GFR calc non Af Amer: 7 mL/min — ABNORMAL LOW (ref >60)
GLUCOSE: 119 mg/dL — AB (ref 65–99)
Potassium: 5.2 mmol/L — ABNORMAL HIGH (ref 3.5–5.1)
Sodium: 138 mmol/L (ref 135–145)
Total Bilirubin: 1.1 mg/dL (ref 0.3–1.2)
Total Protein: 7 g/dL (ref 6.5–8.1)

## 2017-07-19 LAB — CBC WITH DIFFERENTIAL/PLATELET
BASOS ABS: 0.2 10*3/uL — AB (ref 0.0–0.1)
BASOS PCT: 2 %
EOS ABS: 0.4 10*3/uL (ref 0.0–0.7)
EOS PCT: 5 %
HCT: 35.8 % — ABNORMAL LOW (ref 39.0–52.0)
Hemoglobin: 11.2 g/dL — ABNORMAL LOW (ref 13.0–17.0)
LYMPHS PCT: 15 %
Lymphs Abs: 1.2 10*3/uL (ref 0.7–4.0)
MCH: 27.7 pg (ref 26.0–34.0)
MCHC: 31.3 g/dL (ref 30.0–36.0)
MCV: 88.4 fL (ref 78.0–100.0)
Monocytes Absolute: 0.9 10*3/uL (ref 0.1–1.0)
Monocytes Relative: 11 %
Neutro Abs: 5.4 10*3/uL (ref 1.7–7.7)
Neutrophils Relative %: 67 %
PLATELETS: 462 10*3/uL — AB (ref 150–400)
RBC: 4.05 MIL/uL — AB (ref 4.22–5.81)
RDW: 19 % — AB (ref 11.5–15.5)
WBC: 8.2 10*3/uL (ref 4.0–10.5)

## 2017-07-19 LAB — I-STAT CG4 LACTIC ACID, ED
LACTIC ACID, VENOUS: 0.96 mmol/L (ref 0.5–1.9)
Lactic Acid, Venous: 0.92 mmol/L (ref 0.5–1.9)

## 2017-07-19 LAB — PROTIME-INR
INR: 1.2
PROTHROMBIN TIME: 15.1 s (ref 11.4–15.2)

## 2017-07-19 MED ORDER — VANCOMYCIN HCL 10 G IV SOLR
2000.0000 mg | Freq: Once | INTRAVENOUS | Status: DC
  Filled 2017-07-19: qty 2000

## 2017-07-19 MED ORDER — OXYCODONE-ACETAMINOPHEN 5-325 MG PO TABS
1.0000 | ORAL_TABLET | ORAL | Status: DC | PRN
  Administered 2017-07-19: 1 via ORAL
  Filled 2017-07-19: qty 1

## 2017-07-19 MED ORDER — FENTANYL CITRATE (PF) 100 MCG/2ML IJ SOLN
50.0000 ug | Freq: Once | INTRAMUSCULAR | Status: AC
  Administered 2017-07-19: 50 ug via INTRAVENOUS
  Filled 2017-07-19: qty 2

## 2017-07-19 MED ORDER — PIPERACILLIN-TAZOBACTAM 3.375 G IVPB
3.3750 g | Freq: Two times a day (BID) | INTRAVENOUS | Status: DC
  Administered 2017-07-20 (×2): 3.375 g via INTRAVENOUS
  Filled 2017-07-19 (×2): qty 50

## 2017-07-19 NOTE — ED Triage Notes (Signed)
Pt has had redness, swelling and hot to touch to LL leg for a few days. Pt received a dose of vancomycin on Friday at dialysis for cellulitis. Hx of same
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
## 2017-07-19 NOTE — ED Notes (Signed)
Patient transported to X-ray 

## 2017-07-20 ENCOUNTER — Encounter (HOSPITAL_COMMUNITY): Payer: Self-pay | Admitting: Internal Medicine

## 2017-07-20 ENCOUNTER — Inpatient Hospital Stay (HOSPITAL_COMMUNITY): Payer: Medicare Other

## 2017-07-20 ENCOUNTER — Other Ambulatory Visit: Payer: Self-pay

## 2017-07-20 DIAGNOSIS — I76 Septic arterial embolism: Secondary | ICD-10-CM | POA: Diagnosis not present

## 2017-07-20 DIAGNOSIS — I442 Atrioventricular block, complete: Secondary | ICD-10-CM

## 2017-07-20 DIAGNOSIS — E1152 Type 2 diabetes mellitus with diabetic peripheral angiopathy with gangrene: Secondary | ICD-10-CM | POA: Diagnosis present

## 2017-07-20 DIAGNOSIS — E785 Hyperlipidemia, unspecified: Secondary | ICD-10-CM | POA: Diagnosis present

## 2017-07-20 DIAGNOSIS — I272 Pulmonary hypertension, unspecified: Secondary | ICD-10-CM | POA: Diagnosis present

## 2017-07-20 DIAGNOSIS — I5042 Chronic combined systolic (congestive) and diastolic (congestive) heart failure: Secondary | ICD-10-CM | POA: Diagnosis present

## 2017-07-20 DIAGNOSIS — M79609 Pain in unspecified limb: Secondary | ICD-10-CM

## 2017-07-20 DIAGNOSIS — I639 Cerebral infarction, unspecified: Secondary | ICD-10-CM | POA: Diagnosis not present

## 2017-07-20 DIAGNOSIS — Z515 Encounter for palliative care: Secondary | ICD-10-CM | POA: Diagnosis not present

## 2017-07-20 DIAGNOSIS — L03116 Cellulitis of left lower limb: Secondary | ICD-10-CM | POA: Diagnosis present

## 2017-07-20 DIAGNOSIS — M792 Neuralgia and neuritis, unspecified: Secondary | ICD-10-CM | POA: Diagnosis not present

## 2017-07-20 DIAGNOSIS — Z85528 Personal history of other malignant neoplasm of kidney: Secondary | ICD-10-CM | POA: Diagnosis not present

## 2017-07-20 DIAGNOSIS — I7025 Atherosclerosis of native arteries of other extremities with ulceration: Secondary | ICD-10-CM | POA: Diagnosis present

## 2017-07-20 DIAGNOSIS — L02416 Cutaneous abscess of left lower limb: Secondary | ICD-10-CM

## 2017-07-20 DIAGNOSIS — I6340 Cerebral infarction due to embolism of unspecified cerebral artery: Secondary | ICD-10-CM | POA: Diagnosis not present

## 2017-07-20 DIAGNOSIS — Z7901 Long term (current) use of anticoagulants: Secondary | ICD-10-CM | POA: Diagnosis not present

## 2017-07-20 DIAGNOSIS — I10 Essential (primary) hypertension: Secondary | ICD-10-CM | POA: Diagnosis not present

## 2017-07-20 DIAGNOSIS — L0390 Cellulitis, unspecified: Secondary | ICD-10-CM | POA: Diagnosis not present

## 2017-07-20 DIAGNOSIS — G40909 Epilepsy, unspecified, not intractable, without status epilepticus: Secondary | ICD-10-CM | POA: Diagnosis present

## 2017-07-20 DIAGNOSIS — G9341 Metabolic encephalopathy: Secondary | ICD-10-CM | POA: Diagnosis not present

## 2017-07-20 DIAGNOSIS — G894 Chronic pain syndrome: Secondary | ICD-10-CM | POA: Diagnosis present

## 2017-07-20 DIAGNOSIS — I6349 Cerebral infarction due to embolism of other cerebral artery: Secondary | ICD-10-CM | POA: Diagnosis not present

## 2017-07-20 DIAGNOSIS — E669 Obesity, unspecified: Secondary | ICD-10-CM | POA: Diagnosis not present

## 2017-07-20 DIAGNOSIS — M79605 Pain in left leg: Secondary | ICD-10-CM | POA: Diagnosis not present

## 2017-07-20 DIAGNOSIS — I132 Hypertensive heart and chronic kidney disease with heart failure and with stage 5 chronic kidney disease, or end stage renal disease: Secondary | ICD-10-CM | POA: Diagnosis present

## 2017-07-20 DIAGNOSIS — R5381 Other malaise: Secondary | ICD-10-CM | POA: Diagnosis present

## 2017-07-20 DIAGNOSIS — E1169 Type 2 diabetes mellitus with other specified complication: Secondary | ICD-10-CM | POA: Diagnosis not present

## 2017-07-20 DIAGNOSIS — I5043 Acute on chronic combined systolic (congestive) and diastolic (congestive) heart failure: Secondary | ICD-10-CM | POA: Diagnosis not present

## 2017-07-20 DIAGNOSIS — M549 Dorsalgia, unspecified: Secondary | ICD-10-CM | POA: Diagnosis present

## 2017-07-20 DIAGNOSIS — D638 Anemia in other chronic diseases classified elsewhere: Secondary | ICD-10-CM | POA: Diagnosis present

## 2017-07-20 DIAGNOSIS — I080 Rheumatic disorders of both mitral and aortic valves: Secondary | ICD-10-CM | POA: Diagnosis present

## 2017-07-20 DIAGNOSIS — E1140 Type 2 diabetes mellitus with diabetic neuropathy, unspecified: Secondary | ICD-10-CM | POA: Diagnosis present

## 2017-07-20 DIAGNOSIS — R791 Abnormal coagulation profile: Secondary | ICD-10-CM | POA: Diagnosis present

## 2017-07-20 DIAGNOSIS — Z950 Presence of cardiac pacemaker: Secondary | ICD-10-CM | POA: Diagnosis not present

## 2017-07-20 DIAGNOSIS — Z89611 Acquired absence of right leg above knee: Secondary | ICD-10-CM | POA: Diagnosis not present

## 2017-07-20 DIAGNOSIS — D631 Anemia in chronic kidney disease: Secondary | ICD-10-CM | POA: Diagnosis present

## 2017-07-20 DIAGNOSIS — Z7189 Other specified counseling: Secondary | ICD-10-CM | POA: Diagnosis not present

## 2017-07-20 DIAGNOSIS — G9349 Other encephalopathy: Secondary | ICD-10-CM | POA: Diagnosis present

## 2017-07-20 DIAGNOSIS — F17210 Nicotine dependence, cigarettes, uncomplicated: Secondary | ICD-10-CM | POA: Diagnosis present

## 2017-07-20 DIAGNOSIS — Z905 Acquired absence of kidney: Secondary | ICD-10-CM | POA: Diagnosis not present

## 2017-07-20 DIAGNOSIS — I5032 Chronic diastolic (congestive) heart failure: Secondary | ICD-10-CM | POA: Diagnosis not present

## 2017-07-20 DIAGNOSIS — E1122 Type 2 diabetes mellitus with diabetic chronic kidney disease: Secondary | ICD-10-CM | POA: Diagnosis present

## 2017-07-20 DIAGNOSIS — G8918 Other acute postprocedural pain: Secondary | ICD-10-CM | POA: Diagnosis not present

## 2017-07-20 DIAGNOSIS — I361 Nonrheumatic tricuspid (valve) insufficiency: Secondary | ICD-10-CM | POA: Diagnosis not present

## 2017-07-20 DIAGNOSIS — G43909 Migraine, unspecified, not intractable, without status migrainosus: Secondary | ICD-10-CM | POA: Diagnosis present

## 2017-07-20 DIAGNOSIS — R401 Stupor: Secondary | ICD-10-CM | POA: Diagnosis not present

## 2017-07-20 DIAGNOSIS — Z66 Do not resuscitate: Secondary | ICD-10-CM | POA: Diagnosis present

## 2017-07-20 DIAGNOSIS — I2510 Atherosclerotic heart disease of native coronary artery without angina pectoris: Secondary | ICD-10-CM | POA: Diagnosis present

## 2017-07-20 DIAGNOSIS — I4892 Unspecified atrial flutter: Secondary | ICD-10-CM | POA: Diagnosis present

## 2017-07-20 DIAGNOSIS — I350 Nonrheumatic aortic (valve) stenosis: Secondary | ICD-10-CM | POA: Diagnosis not present

## 2017-07-20 DIAGNOSIS — E46 Unspecified protein-calorie malnutrition: Secondary | ICD-10-CM | POA: Diagnosis present

## 2017-07-20 DIAGNOSIS — Z992 Dependence on renal dialysis: Secondary | ICD-10-CM | POA: Diagnosis not present

## 2017-07-20 DIAGNOSIS — F319 Bipolar disorder, unspecified: Secondary | ICD-10-CM | POA: Diagnosis present

## 2017-07-20 DIAGNOSIS — G92 Toxic encephalopathy: Secondary | ICD-10-CM | POA: Diagnosis not present

## 2017-07-20 DIAGNOSIS — E875 Hyperkalemia: Secondary | ICD-10-CM | POA: Diagnosis present

## 2017-07-20 DIAGNOSIS — N186 End stage renal disease: Secondary | ICD-10-CM | POA: Diagnosis present

## 2017-07-20 DIAGNOSIS — Z8673 Personal history of transient ischemic attack (TIA), and cerebral infarction without residual deficits: Secondary | ICD-10-CM | POA: Diagnosis not present

## 2017-07-20 LAB — CBC
HCT: 33.6 % — ABNORMAL LOW (ref 39.0–52.0)
Hemoglobin: 10.1 g/dL — ABNORMAL LOW (ref 13.0–17.0)
MCH: 26.8 pg (ref 26.0–34.0)
MCHC: 30.1 g/dL (ref 30.0–36.0)
MCV: 89.1 fL (ref 78.0–100.0)
PLATELETS: 466 10*3/uL — AB (ref 150–400)
RBC: 3.77 MIL/uL — AB (ref 4.22–5.81)
RDW: 19 % — ABNORMAL HIGH (ref 11.5–15.5)
WBC: 9.7 10*3/uL (ref 4.0–10.5)

## 2017-07-20 LAB — PROTIME-INR
INR: 1.25
PROTHROMBIN TIME: 15.6 s — AB (ref 11.4–15.2)

## 2017-07-20 LAB — BASIC METABOLIC PANEL
Anion gap: 19 — ABNORMAL HIGH (ref 5–15)
BUN: 69 mg/dL — AB (ref 6–20)
CO2: 22 mmol/L (ref 22–32)
CREATININE: 8.67 mg/dL — AB (ref 0.61–1.24)
Calcium: 8.9 mg/dL (ref 8.9–10.3)
Chloride: 97 mmol/L — ABNORMAL LOW (ref 101–111)
GFR calc Af Amer: 8 mL/min — ABNORMAL LOW (ref >60)
GFR, EST NON AFRICAN AMERICAN: 7 mL/min — AB (ref >60)
Glucose, Bld: 97 mg/dL (ref 65–99)
POTASSIUM: 5.9 mmol/L — AB (ref 3.5–5.1)
SODIUM: 138 mmol/L (ref 135–145)

## 2017-07-20 LAB — HEPARIN LEVEL (UNFRACTIONATED)
Heparin Unfractionated: <0.1 IU/mL — ABNORMAL LOW (ref 0.30–0.70)
Heparin Unfractionated: <0.1 IU/mL — ABNORMAL LOW (ref 0.30–0.70)

## 2017-07-20 LAB — GLUCOSE, CAPILLARY: GLUCOSE-CAPILLARY: 100 mg/dL — AB (ref 65–99)

## 2017-07-20 LAB — VANCOMYCIN, RANDOM: Vancomycin Rm: 7

## 2017-07-20 MED ORDER — FUROSEMIDE 40 MG PO TABS
120.0000 mg | ORAL_TABLET | Freq: Two times a day (BID) | ORAL | Status: DC
  Administered 2017-07-20 – 2017-07-25 (×8): 120 mg via ORAL
  Filled 2017-07-20 (×10): qty 3

## 2017-07-20 MED ORDER — AMLODIPINE BESYLATE 10 MG PO TABS
10.0000 mg | ORAL_TABLET | Freq: Every day | ORAL | Status: DC
  Administered 2017-07-20 – 2017-08-05 (×14): 10 mg via ORAL
  Filled 2017-07-20 (×15): qty 1

## 2017-07-20 MED ORDER — SODIUM POLYSTYRENE SULFONATE PO POWD
30.0000 g | Freq: Once | ORAL | Status: AC
  Administered 2017-07-20: 30 g via ORAL
  Filled 2017-07-20: qty 30

## 2017-07-20 MED ORDER — HEPARIN BOLUS VIA INFUSION
2000.0000 [IU] | Freq: Once | INTRAVENOUS | Status: AC
  Administered 2017-07-21: 2000 [IU] via INTRAVENOUS
  Filled 2017-07-20: qty 2000

## 2017-07-20 MED ORDER — RENA-VITE PO TABS
1.0000 | ORAL_TABLET | Freq: Every day | ORAL | Status: DC
  Administered 2017-07-20 – 2017-08-04 (×15): 1 via ORAL
  Filled 2017-07-20 (×15): qty 1

## 2017-07-20 MED ORDER — ALPRAZOLAM 0.5 MG PO TABS
1.0000 mg | ORAL_TABLET | Freq: Three times a day (TID) | ORAL | Status: DC | PRN
  Administered 2017-07-20 – 2017-07-22 (×5): 1 mg via ORAL
  Filled 2017-07-20 (×5): qty 2

## 2017-07-20 MED ORDER — OXYCODONE-ACETAMINOPHEN 5-325 MG PO TABS
1.0000 | ORAL_TABLET | ORAL | Status: DC | PRN
  Administered 2017-07-20 – 2017-07-22 (×6): 2 via ORAL
  Filled 2017-07-20 (×7): qty 2

## 2017-07-20 MED ORDER — WARFARIN SODIUM 7.5 MG PO TABS
7.5000 mg | ORAL_TABLET | Freq: Once | ORAL | Status: AC
  Administered 2017-07-20: 7.5 mg via ORAL
  Filled 2017-07-20: qty 1

## 2017-07-20 MED ORDER — VANCOMYCIN HCL IN DEXTROSE 1-5 GM/200ML-% IV SOLN
1000.0000 mg | Freq: Once | INTRAVENOUS | Status: AC
  Administered 2017-07-20: 1000 mg via INTRAVENOUS
  Filled 2017-07-20: qty 200

## 2017-07-20 MED ORDER — HYDRALAZINE HCL 50 MG PO TABS
100.0000 mg | ORAL_TABLET | Freq: Three times a day (TID) | ORAL | Status: DC
  Administered 2017-07-20 – 2017-08-05 (×37): 100 mg via ORAL
  Filled 2017-07-20 (×42): qty 2

## 2017-07-20 MED ORDER — ESCITALOPRAM OXALATE 10 MG PO TABS
10.0000 mg | ORAL_TABLET | Freq: Every day | ORAL | Status: DC
  Administered 2017-07-20 – 2017-08-05 (×14): 10 mg via ORAL
  Filled 2017-07-20 (×15): qty 1

## 2017-07-20 MED ORDER — VANCOMYCIN HCL IN DEXTROSE 1-5 GM/200ML-% IV SOLN
1000.0000 mg | INTRAVENOUS | Status: DC
  Administered 2017-07-21 – 2017-07-25 (×3): 1000 mg via INTRAVENOUS
  Filled 2017-07-20 (×4): qty 200

## 2017-07-20 MED ORDER — NITROGLYCERIN 0.4 MG SL SUBL: 0.4000 mg | SUBLINGUAL_TABLET | SUBLINGUAL | Status: DC | PRN

## 2017-07-20 MED ORDER — DIVALPROEX SODIUM 125 MG PO CSDR
500.0000 mg | DELAYED_RELEASE_CAPSULE | Freq: Two times a day (BID) | ORAL | Status: DC
  Administered 2017-07-20 – 2017-08-05 (×30): 500 mg via ORAL
  Filled 2017-07-20 (×35): qty 4

## 2017-07-20 MED ORDER — CINACALCET HCL 30 MG PO TABS
60.0000 mg | ORAL_TABLET | Freq: Every day | ORAL | Status: DC
  Administered 2017-07-20 – 2017-07-27 (×5): 60 mg via ORAL
  Filled 2017-07-20 (×5): qty 2

## 2017-07-20 MED ORDER — SODIUM POLYSTYRENE SULFONATE 15 GM/60ML PO SUSP
30.0000 g | Freq: Once | ORAL | Status: DC
  Filled 2017-07-20: qty 120

## 2017-07-20 MED ORDER — HEPARIN BOLUS VIA INFUSION
2000.0000 [IU] | Freq: Once | INTRAVENOUS | Status: AC
  Administered 2017-07-20: 2000 [IU] via INTRAVENOUS
  Filled 2017-07-20: qty 2000

## 2017-07-20 MED ORDER — CARVEDILOL 25 MG PO TABS
25.0000 mg | ORAL_TABLET | Freq: Two times a day (BID) | ORAL | Status: DC
  Administered 2017-07-20 – 2017-08-05 (×27): 25 mg via ORAL
  Filled 2017-07-20 (×12): qty 1
  Filled 2017-07-20 (×2): qty 2
  Filled 2017-07-20 (×3): qty 1
  Filled 2017-07-20: qty 2
  Filled 2017-07-20 (×2): qty 1
  Filled 2017-07-20: qty 2
  Filled 2017-07-20 (×6): qty 1
  Filled 2017-07-20: qty 2
  Filled 2017-07-20: qty 1

## 2017-07-20 MED ORDER — PRAVASTATIN SODIUM 40 MG PO TABS
40.0000 mg | ORAL_TABLET | Freq: Every day | ORAL | Status: DC
  Administered 2017-07-20 – 2017-07-29 (×8): 40 mg via ORAL
  Filled 2017-07-20 (×9): qty 1

## 2017-07-20 MED ORDER — ISOSORBIDE DINITRATE 10 MG PO TABS
10.0000 mg | ORAL_TABLET | Freq: Two times a day (BID) | ORAL | Status: DC
  Administered 2017-07-20 – 2017-08-05 (×30): 10 mg via ORAL
  Filled 2017-07-20 (×34): qty 1

## 2017-07-20 MED ORDER — GABAPENTIN 100 MG PO CAPS
100.0000 mg | ORAL_CAPSULE | Freq: Two times a day (BID) | ORAL | Status: DC
  Administered 2017-07-20 – 2017-08-05 (×30): 100 mg via ORAL
  Filled 2017-07-20 (×31): qty 1

## 2017-07-20 MED ORDER — IRBESARTAN 300 MG PO TABS
300.0000 mg | ORAL_TABLET | Freq: Every day | ORAL | Status: DC
  Administered 2017-07-20 – 2017-08-05 (×14): 300 mg via ORAL
  Filled 2017-07-20 (×15): qty 1

## 2017-07-20 MED ORDER — WARFARIN - PHARMACIST DOSING INPATIENT: Freq: Every day | Status: DC

## 2017-07-20 MED ORDER — HEPARIN (PORCINE) IN NACL 100-0.45 UNIT/ML-% IJ SOLN
1800.0000 [IU]/h | INTRAMUSCULAR | Status: DC
  Administered 2017-07-20: 1300 [IU]/h via INTRAVENOUS
  Filled 2017-07-20 (×3): qty 250

## 2017-07-20 MED ORDER — FERRIC CITRATE 1 GM 210 MG(FE) PO TABS
420.0000 mg | ORAL_TABLET | Freq: Three times a day (TID) | ORAL | Status: DC
  Administered 2017-07-20 – 2017-07-26 (×8): 420 mg via ORAL
  Filled 2017-07-20 (×20): qty 2

## 2017-07-20 MED ORDER — CLONIDINE HCL 0.2 MG/24HR TD PTWK
0.2000 mg | MEDICATED_PATCH | TRANSDERMAL | Status: DC
Start: 2017-07-22 — End: 2017-08-05
  Administered 2017-07-22 – 2017-08-05 (×3): 0.2 mg via TRANSDERMAL
  Filled 2017-07-20 (×3): qty 1

## 2017-07-20 MED ORDER — SENNOSIDES-DOCUSATE SODIUM 8.6-50 MG PO TABS: 1.0000 | ORAL_TABLET | Freq: Every evening | ORAL | Status: DC | PRN

## 2017-07-20 NOTE — Progress Notes (Signed)
ANTICOAGULATION CONSULT NOTE - Follow Up Consult  Pharmacy Consult for Heparin Indication: atrial fibrillation and rule out DVT  Allergies  Allergen Reactions  . Dilaudid [Hydromorphone Hcl] Other (See Comments)    ABNORMAL BEHAVIOR, "VERBALLY AND PHYSICALLY ABUSIVE," PSYCHOSIS  . Morphine And Related Itching  . Adhesive [Tape] Other (See Comments)    Redness from adhesive tape if left on too long, paper tape is preferred    Patient Measurements: Height: 5\' 10"  (177.8 cm) Weight: 231 lb 7.7 oz (105 kg) IBW/kg (Calculated) : 73 Heparin Dosing Weight: 95.4 kg  Vital Signs: Temp: 98.5 F (36.9 C) (01/20 2135) Temp Source: Oral (01/20 2135) BP: 133/75 (01/20 2135) Pulse Rate: 87 (01/20 2135)  Labs: Recent Labs    07/19/17 2018 07/19/17 2315 07/20/17 0929 07/20/17 2125  HGB 11.2*  --  10.1*  --   HCT 35.8*  --  33.6*  --   PLT 462*  --  466*  --   LABPROT  --  15.1 15.6*  --   INR  --  1.20 1.25  --   HEPARINUNFRC  --   --  <0.10* <0.10*  CREATININE 7.97*  --  8.67*  --     Assessment: 47 y/o M with lower extremity cellulitis/swelling, pt has ESRD on HD MWF, pt on warfarin PTA for afib, INR is low at 1.2, sounds like pt took himself off warfarin due to a bleeding dialysis fistula (resolved) and was supposed to re-start warfarin on 1/17. Given lower extremity swelling, MD would also like to rule out a DVT.   Dopplers negative for DVT in LLE per vascular lab technician note.  Initial heparin level was undetectable earlier today- patient was rebolused and heparin rate increased. Repeat level this evening is still undetectable. No bleeding noted.  Goal of Therapy:  INR 2-3 Heparin level 0.3-0.7 units/ml Monitor platelets by anticoagulation protocol: Yes   Plan:  Re-bolus Heparin 2000 units x1. Increase heparin drip to 1800 units/hr. Recheck Heparin level in 8 hours.  Daily Heparin level, and CBC.   Chariti Havel D. [place], PharmD, BCPS Clinical Pharmacist 07/20/2017  10:34 PM

## 2017-07-20 NOTE — Progress Notes (Signed)
Plan of care:  47 year old male admitted by Dr. Daryll Drown this morning for evaluation of left lower extremity pain and erythema concerning for cellulitis.  Apparently patient had injured his extremity and since 2 weeks he has noticed worsening of erythema and swelling along with pain.  Apparently before his last dialysis session he received a dose of vancomycin but due to worsening of this he was brought to the ER. Patient was started on broad-spectrum antibiotics vancomycin and Zosyn but I will discontinue Zosyn.  Lower extremity ultrasound is still pending to rule out DVT.  He is on Coumadin for atrial flutter but his INR is subtherapeutic as he is unable to fill his Coumadin prescription recently outpatient.  We will continue heparin drip until we rule out DVT and bridge him to Coumadin with INR of 2-3. Will check EKG due to HyperK. Rest of the care as plan per admitting physician. Nephrology has been consulted as patient will need dialysis.  Call with further questions as necessary
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
## 2017-07-20 NOTE — Progress Notes (Signed)
Pharmacy Antibiotic Note  Hayden [name] is a 47 y.o. male admitted on 07/19/2017 with cellulitis.  Pharmacy has been consulted for Vancomycin/Zosyn dosing. WBC WNL. Pt has ESRD on HD MWF  Please note: pt received an unknown dosage of vancomycin at HD on Friday. I checked a random vancomycin level which came back low at 7.   Plan: Vancomycin 1000 mg IV x 1 now, then 1000 mg IV qHD MWF Zosyn 3.375G IV q12h to be infused over 4 hours Trend WBC, temp, HD schedule  F/U infectious work-up Drug levels as indicated  Temp (24hrs), Avg:98.9 F (37.2 C), Min:98.9 F (37.2 C), Max:98.9 F (37.2 C)  Recent Labs  Lab 07/19/17 2018 07/19/17 2032 07/19/17 2328 07/20/17 0013  WBC 8.2  --   --   --   CREATININE 7.97*  --   --   --   LATICACIDVEN  --  0.92 0.96  --   VANCORANDOM  --   --   --  7    CrCl cannot be calculated (Unknown ideal weight.).    Allergies  Allergen Reactions  . Dilaudid [Hydromorphone Hcl] Other (See Comments)    ABNORMAL BEHAVIOR, "VERBALLY AND PHYSICALLY ABUSIVE," PSYCHOSIS  . Morphine And Related Itching  . Adhesive [Tape] Other (See Comments)    Redness from adhesive tape if left on too long, paper tape is preferred   Narda Bonds 07/20/2017 1:08 AM
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
## 2017-07-20 NOTE — Progress Notes (Signed)
*  Preliminary Results* Left lower extremity venous duplex completed. Left lower extremity is negative for deep vein thrombosis. There is no evidence of left Baker's cyst.  07/20/2017 2:46 PM  Maudry Mayhew, BS, RVT, RDCS, RDMS
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
## 2017-07-20 NOTE — ED Notes (Signed)
Admitting MD at bedside.

## 2017-07-20 NOTE — ED Notes (Signed)
Pt's SpO2 sats dropped into the 80s while resting. Pt placed on 2L [place].

## 2017-07-20 NOTE — Progress Notes (Addendum)
ANTICOAGULATION CONSULT NOTE - Follow Up Consult  Pharmacy Consult for Heparin/Warfarin Indication: atrial fibrillation and rule out DVT  Allergies  Allergen Reactions  . Dilaudid [Hydromorphone Hcl] Other (See Comments)    ABNORMAL BEHAVIOR, "VERBALLY AND PHYSICALLY ABUSIVE," PSYCHOSIS  . Morphine And Related Itching  . Adhesive [Tape] Other (See Comments)    Redness from adhesive tape if left on too long, paper tape is preferred    Patient Measurements: Height: 5\' 10"  (177.8 cm) Weight: 231 lb 7.7 oz (105 kg) IBW/kg (Calculated) : 73 Heparin Dosing Weight: 95.4 kg  Vital Signs: Temp: 98.7 F (37.1 C) (01/20 0657) Temp Source: Oral (01/20 0657) BP: 130/90 (01/20 0657) Pulse Rate: 90 (01/20 0657)  Labs: Recent Labs    07/19/17 2018 07/19/17 2315 07/20/17 0929  HGB 11.2*  --  10.1*  HCT 35.8*  --  33.6*  PLT 462*  --  466*  LABPROT  --  15.1 15.6*  INR  --  1.20 1.25  HEPARINUNFRC  --   --  <0.10*  CREATININE 7.97*  --  8.67*    Estimated Creatinine Clearance: 13.1 mL/min (A) (by C-G formula based on SCr of 8.67 mg/dL (H)).   Medications:  Infusions:  . heparin 1,300 Units/hr (07/20/17 0207)  . [START ON 07/21/2017] vancomycin      Assessment: 47 y/o M with lower extremity cellulitis/swelling, pt has ESRD on HD MWF, pt on warfarin PTA for afib, INR is low at 1.2, sounds like pt took himself off warfarin due to a bleeding dialysis fistula (resolved) and was supposed to re-start warfarin on 1/17. Given lower extremity swelling, MD would also like to rule out a DVT.   Initial heparin level is <0.10. INR 1.25. Hgb low at 10.1. Platelets elevated. No bleeding or issues with infusion per RN, Sharyn Lull.   Goal of Therapy:  INR 2-3 Heparin level 0.3-0.7 units/ml Monitor platelets by anticoagulation protocol: Yes   Plan:  Re-bolus Heparin 2000 units x1. Increase heparin drip to 1650 units/hr. Recheck Heparin level in 8 hours.  Continue with Warfarin 7.5mg  po x1  today at 1800. Daily PT/INR, Heparin level, and CBC.   Sloan Leiter, PharmD, BCPS, BCCCP Clinical Pharmacist Clinical phone 07/20/2017 until 3:30PM [phone] After hours, please call [phone] 07/20/2017,12:17 PM

## 2017-07-21 ENCOUNTER — Inpatient Hospital Stay (HOSPITAL_COMMUNITY): Payer: Medicare Other

## 2017-07-21 LAB — RENAL FUNCTION PANEL
ALBUMIN: 2.9 g/dL — AB (ref 3.5–5.0)
Anion gap: 19 — ABNORMAL HIGH (ref 5–15)
BUN: 86 mg/dL — AB (ref 6–20)
CHLORIDE: 98 mmol/L — AB (ref 101–111)
CO2: 21 mmol/L — ABNORMAL LOW (ref 22–32)
CREATININE: 9.93 mg/dL — AB (ref 0.61–1.24)
Calcium: 8.7 mg/dL — ABNORMAL LOW (ref 8.9–10.3)
GFR calc Af Amer: 6 mL/min — ABNORMAL LOW (ref >60)
GFR, EST NON AFRICAN AMERICAN: 6 mL/min — AB (ref >60)
Glucose, Bld: 91 mg/dL (ref 65–99)
PHOSPHORUS: 12.8 mg/dL — AB (ref 2.5–4.6)
Potassium: 6.7 mmol/L (ref 3.5–5.1)
Sodium: 138 mmol/L (ref 135–145)

## 2017-07-21 LAB — CBC
HCT: 32.9 % — ABNORMAL LOW (ref 39.0–52.0)
Hemoglobin: 10.2 g/dL — ABNORMAL LOW (ref 13.0–17.0)
MCH: 27.6 pg (ref 26.0–34.0)
MCHC: 31 g/dL (ref 30.0–36.0)
MCV: 88.9 fL (ref 78.0–100.0)
PLATELETS: 499 10*3/uL — AB (ref 150–400)
RBC: 3.7 MIL/uL — AB (ref 4.22–5.81)
RDW: 18.8 % — ABNORMAL HIGH (ref 11.5–15.5)
WBC: 11.5 10*3/uL — AB (ref 4.0–10.5)

## 2017-07-21 LAB — GLUCOSE, CAPILLARY
GLUCOSE-CAPILLARY: 99 mg/dL (ref 65–99)
GLUCOSE-CAPILLARY: 80 mg/dL (ref 65–99)

## 2017-07-21 LAB — PROTIME-INR
INR: 1.35
Prothrombin Time: 16.5 seconds — ABNORMAL HIGH (ref 11.4–15.2)

## 2017-07-21 MED ORDER — VANCOMYCIN HCL IN DEXTROSE 1-5 GM/200ML-% IV SOLN
INTRAVENOUS | Status: AC
  Filled 2017-07-21: qty 200

## 2017-07-21 MED ORDER — WARFARIN SODIUM 7.5 MG PO TABS
7.5000 mg | ORAL_TABLET | Freq: Once | ORAL | Status: AC
  Administered 2017-07-21: 7.5 mg via ORAL
  Filled 2017-07-21 (×2): qty 1

## 2017-07-21 MED ORDER — PIPERACILLIN-TAZOBACTAM 3.375 G IVPB
3.3750 g | Freq: Two times a day (BID) | INTRAVENOUS | Status: DC
  Administered 2017-07-21 – 2017-07-28 (×13): 3.375 g via INTRAVENOUS
  Filled 2017-07-21 (×15): qty 50

## 2017-07-21 NOTE — Progress Notes (Signed)
Was the fall witnessed: Yes  Patient condition before and after the fall: Alert and oriented.  Unchanged  Patient's reaction to the fall: indiferent  Name of the doctor that was notified including date and time: TRH  Any interventions and vital signs: Documented in flow sheet. Bed alarm placed.
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
## 2017-07-21 NOTE — Progress Notes (Signed)
[facility] Progress Note  Subjective: very groggy on HD this am  Vitals:   07/21/17 1030 07/21/17 1100 07/21/17 1130 07/21/17 1145  BP: 126/71 135/77 132/75 (!) 144/74  Pulse: 83 85 82 76  Resp: 11 13 11 12   Temp:      TempSrc:      SpO2:   96% 97%  Weight:      Height:        Inpatient medications: . amLODipine  10 mg Oral Daily  . carvedilol  25 mg Oral BID WC  . cinacalcet  60 mg Oral Q breakfast  . [START ON 07/22/2017] cloNIDine  0.2 mg Transdermal Q Tue  . divalproex  500 mg Oral Q12H  . escitalopram  10 mg Oral Daily  . ferric citrate  420 mg Oral TID WC  . furosemide  120 mg Oral BID  . gabapentin  100 mg Oral BID  . hydrALAZINE  100 mg Oral Q8H  . irbesartan  300 mg Oral Daily  . isosorbide dinitrate  10 mg Oral BID  . multivitamin  1 tablet Oral QHS  . pravastatin  40 mg Oral Daily  . warfarin  7.5 mg Oral ONCE-1800  . Warfarin - Pharmacist Dosing Inpatient   Does not apply q1800   . vancomycin 1,000 mg (07/21/17 1144)   ALPRAZolam, nitroGLYCERIN, oxyCODONE-acetaminophen, senna-docusate  Exam: Very groggy, not waking up , no distress No jvd Chest cta bilat RRR 3/6 holosyst M abd obese soft ntnd R AKA w/o edema, 2+ LLE edema, erythema L shin to ankle area L AVF+bruit  Dialysis: MWF [facility] 4h  450/800  98kg   2/2.25 bath  L AVF Hep 2400 - Hectoral 43mcg IV q HD - Venofer 100mg  x 10 ordered (4 given) - Mircera 259mcg IV q 2 weeks (last 1/18) -home BP Rx  norvasc 10, coreg 25 bid, clon patch 0.2 weekly, lasix 120 bid, hydral 100 tid, avapro 300       Impression: 1 Cellulitis 2 ESRD vol^^, hyperkalemia, low kt/v at center 3 AMS hx mania 4 Bipolar 5 HTN mult meds, bp ok, get vol down 6 Anemia 7 DM2 8 PVD 9 Aflutter  Plan - HD, max uf,  fistulogram, abx   Kelly Splinter MD [facility] pager [phone]   07/21/2017, 11:53 AM   Recent Labs  Lab 07/19/17 2018 07/20/17 0929 07/21/17 0900  NA 138 138 138  K 5.2*  5.9* 6.7*  CL 99* 97* 98*  CO2 23 22 21*  GLUCOSE 119* 97 91  BUN 63* 69* 86*  CREATININE 7.97* 8.67* 9.93*  CALCIUM 9.2 8.9 8.7*  PHOS  --   --  12.8*   Recent Labs  Lab 07/19/17 2018 07/21/17 0900  AST 14*  --   ALT 13*  --   ALKPHOS 173*  --   BILITOT 1.1  --   PROT 7.0  --   ALBUMIN 3.0* 2.9*   Recent Labs  Lab 07/19/17 2018 07/20/17 0929 07/21/17 0900  WBC 8.2 9.7 11.5*  NEUTROABS 5.4  --   --   HGB 11.2* 10.1* 10.2*  HCT 35.8* 33.6* 32.9*  MCV 88.4 89.1 88.9  PLT 462* 466* 499*   Iron/TIBC/Ferritin/ %Sat    Component Value Date/Time   IRON 18 (L) 12/05/2016 2039   TIBC 185 (L) 12/05/2016 2039   FERRITIN 1,444 (H) 11/15/2015 1342   IRONPCTSAT 10 (L) 12/05/2016 2039

## 2017-07-21 NOTE — Progress Notes (Signed)
PROGRESS NOTE  Jakeob Tullis Sem  WUJ:[id number] DOB: 1971/05/20 DOA: 07/19/2017 PCP: [name], DO   Brief Narrative: Hayden [name] is a 47 y.o. 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 XR showed calcified vessels without evidence of emphysema or fracture. Vancomycin and zosyn were given, nephrology consulted for HD while inpatient, and he was admitted. Fistulogram is planned per IR.   Assessment & Plan: Cellulitis and abscess of left lower extremity: He has risk factors including h/o DM (reports not treated now), atherosclerosis of the limbs and h/o gangrene in the right limb requiring amputation. U/S negative for DVT.  - Erythema not receding from report, developing mild leukocytosis. Will restart zosyn in addition to vancomycin.  - Monitor cultures.   ESRD on hemodialysis with mild hyperkalemia and mild anemia - Nephrology consulted - K continued to rise, received HD 1/21.  - Continuing lasix.  Subtherapeutic INR: He is on coumadin for his vascular disease and Aflutter with a high Chads-vasc score.  He has been out of his coumadin and INR is 1.2 - Restarted coumadin. Appreciate pharmacy assistance with dosing.  History of CVA: Minimal residual deficits. - Continue statin, coumadin    Above knee amputation of right lower extremity  - PT evaluation    Complete heart block (HCC) - s/p PPM - No signs pacemaker is malfunctioning.      Atrial flutter: CHA2DS2-VASc Scoreis 5 - HR is well controlled, continue coreg.      Hyperlipidemia: - Continue statin.   Bipolar/depression - Continue depakote and lexapro  DVT prophylaxis: Coumadin Code Status: Full Family  Communication: None at bedside in HD today Disposition Plan: Continue IV antibiotics, fistulogram today.   Consultants:   Nephrology  IR  Procedures:   HD 07/21/2017  Antimicrobials:  Vancomycin, Zosyn   Subjective: Tired, but rousable in HD. No complaints. States left lower leg is painful.   Objective: Vitals:   07/21/17 1230 07/21/17 1245 07/21/17 1252 07/21/17 1300  BP: 109/63 (!) 131/55 (!) 152/91 (!) 162/94  Pulse: 76 75 90 93  Resp: 11 12 20 19   Temp:   97.8 F (36.6 C) 98 F (36.7 C)  TempSrc:   Axillary Oral  SpO2: 97% 97% 100% 92%  Weight:   101.4 kg (223 lb 8.7 oz)   Height:        Intake/Output Summary (Last 24 hours) at 07/21/2017 1421 Last data filed at 07/21/2017 1300 Gross per 24 hour  Intake 191.8 ml  Output 6000 ml  Net -5808.2 ml   Filed Weights   07/20/17 0348 07/21/17 0830 07/21/17 1252  Weight: 105 kg (231 lb 7.7 oz) 106.8 kg (235 lb 7.2 oz) 101.4 kg (223 lb 8.7 oz)    Gen: Chronically ill-appearing 47 y.o. male in no distress Pulm: Non-labored breathing. Clear to auscultation bilaterally.  CV: Regular rate and rhythm. No murmur, rub, or gallop. No JVD. GI: Abdomen soft, non-tender, non-distended, with normoactive bowel sounds. No organomegaly or masses felt. Ext: RLE amputation site normal. Left lower leg with circumferential blanchable erythema surrounding superficial wounds without purulence. Skin: As above Neuro: Drowsy but oriented. No focal neurological deficits. Psych: UTD  Data Reviewed: I have personally reviewed following labs and imaging  studies  CBC: Recent Labs  Lab 07/19/17 2018 07/20/17 0929 07/21/17 0900  WBC 8.2 9.7 11.5*  NEUTROABS 5.4  --   --   HGB 11.2* 10.1* 10.2*  HCT 35.8* 33.6* 32.9*  MCV 88.4 89.1 88.9  PLT 462* 466* 258*   Basic Metabolic Panel: Recent Labs  Lab 07/19/17 2018 07/20/17 0929 07/21/17 0900  NA 138 138 138  K 5.2* 5.9* 6.7*  CL 99* 97* 98*  CO2 23 22 21*  GLUCOSE 119* 97 91  BUN  63* 69* 86*  CREATININE 7.97* 8.67* 9.93*  CALCIUM 9.2 8.9 8.7*  PHOS  --   --  12.8*   GFR: Estimated Creatinine Clearance: 11.2 mL/min (A) (by C-G formula based on SCr of 9.93 mg/dL (H)). Liver Function Tests: Recent Labs  Lab 07/19/17 2018 07/21/17 0900  AST 14*  --   ALT 13*  --   ALKPHOS 173*  --   BILITOT 1.1  --   PROT 7.0  --   ALBUMIN 3.0* 2.9*   No results for input(s): LIPASE, AMYLASE in the last 168 hours. No results for input(s): AMMONIA in the last 168 hours. Coagulation Profile: Recent Labs  Lab 07/19/17 2315 07/20/17 0929 07/21/17 0900  INR 1.20 1.25 1.35   Cardiac Enzymes: No results for input(s): CKTOTAL, CKMB, CKMBINDEX, TROPONINI in the last 168 hours. BNP (last 3 results) No results for input(s): PROBNP in the last 8760 hours. HbA1C: No results for input(s): HGBA1C in the last 72 hours. CBG: Recent Labs  Lab 07/20/17 0617 07/21/17 0622 07/21/17 1251  GLUCAP 100* 99 80   Lipid Profile: No results for input(s): CHOL, HDL, LDLCALC, TRIG, CHOLHDL, LDLDIRECT in the last 72 hours. Thyroid Function Tests: No results for input(s): TSH, T4TOTAL, FREET4, T3FREE, THYROIDAB in the last 72 hours. Anemia Panel: No results for input(s): VITAMINB12, FOLATE, FERRITIN, TIBC, IRON, RETICCTPCT in the last 72 hours. Urine analysis:    Component Value Date/Time   COLORURINE AMBER (A) 11/04/2015 1930   APPEARANCEUR TURBID (A) 11/04/2015 1930   LABSPEC 1.019 11/04/2015 1930   PHURINE 5.5 11/04/2015 1930   GLUCOSEU 100 (A) 11/04/2015 1930   HGBUR NEGATIVE 11/04/2015 1930   BILIRUBINUR NEGATIVE 11/04/2015 1930   KETONESUR NEGATIVE 11/04/2015 1930   PROTEINUR >300 (A) 11/04/2015 1930   UROBILINOGEN 0.2 09/22/2012 1805   NITRITE NEGATIVE 11/04/2015 1930   LEUKOCYTESUR NEGATIVE 11/04/2015 1930   No results found for this or any previous visit (from the past 240 hour(s)).    Radiology Studies: Dg Tibia/fibula Left  Result Date: 07/20/2017 CLINICAL DATA:   Cellulitis EXAM: LEFT TIBIA AND FIBULA - 2 VIEW COMPARISON:  None. FINDINGS: There is no evidence of fracture or other focal bone lesions. No soft tissue emphysema. Extensive vascular and soft tissue calcification. IMPRESSION: No soft tissue emphysema.  Severe vascular calcification. Electronically Signed   By: Ulyses Jarred M.D.   On: 07/20/2017 00:08    Scheduled Meds: . amLODipine  10 mg Oral Daily  . carvedilol  25 mg Oral BID WC  . cinacalcet  60 mg Oral Q breakfast  . [START ON 07/22/2017] cloNIDine  0.2 mg Transdermal Q Tue  . divalproex  500 mg Oral Q12H  . escitalopram  10 mg Oral Daily  . ferric citrate  420 mg Oral TID WC  . furosemide  120 mg Oral BID  . gabapentin  100 mg Oral BID  . hydrALAZINE  100 mg Oral Q8H  . irbesartan  300 mg Oral Daily  .  isosorbide dinitrate  10 mg Oral BID  . multivitamin  1 tablet Oral QHS  . pravastatin  40 mg Oral Daily  . warfarin  7.5 mg Oral ONCE-1800  . Warfarin - Pharmacist Dosing Inpatient   Does not apply q1800   Continuous Infusions: . vancomycin 1,000 mg (07/21/17 1144)     LOS: 1 day   Time spent: 25 minutes.  Vance Gather, MD [name] Pager [phone]  If 7PM-7AM, please contact night-coverage [email or web address] Password [email or web address] 07/21/2017, 2:21 PM

## 2017-07-21 NOTE — Progress Notes (Signed)
10 am: Potassium of 6.7 addressed by Nephrologist, Dr. Jonnie Finner during HD (see HD Flowsheet).

## 2017-07-21 NOTE — Progress Notes (Addendum)
ANTICOAGULATION CONSULT NOTE - Follow Up Consult  Pharmacy Consult:  Coumadin Indication:  Afib  Allergies  Allergen Reactions  . Dilaudid [Hydromorphone Hcl] Other (See Comments)    ABNORMAL BEHAVIOR, "VERBALLY AND PHYSICALLY ABUSIVE," PSYCHOSIS  . Morphine And Related Itching  . Adhesive [Tape] Other (See Comments)    Redness from adhesive tape if left on too long, paper tape is preferred    Patient Measurements: Height: 5\' 10"  (177.8 cm) Weight: 235 lb 7.2 oz (106.8 kg) IBW/kg (Calculated) : 73  Vital Signs: Temp: 98 F (36.7 C) (01/21 0830) Temp Source: Oral (01/21 0830) BP: 154/86 (01/21 0930) Pulse Rate: 78 (01/21 0930)  Labs: Recent Labs    07/19/17 2018 07/19/17 2315 07/20/17 0929 07/20/17 2125 07/21/17 0900  HGB 11.2*  --  10.1*  --  10.2*  HCT 35.8*  --  33.6*  --  32.9*  PLT 462*  --  466*  --  499*  LABPROT  --  15.1 15.6*  --  16.5*  INR  --  1.20 1.25  --  1.35  HEPARINUNFRC  --   --  <0.10* <0.10*  --   CREATININE 7.97*  --  8.67*  --   --     Estimated Creatinine Clearance: 13.2 mL/min (A) (by C-G formula based on SCr of 8.67 mg/dL (H)).    Assessment: 33 YOM on Coumadin PTA for history of AFib.  Sounds like patient took himself off Coumadin due to a bleeding dialysis fistula (resolved) and was supposed to re-start Coumadin on 07/17/17.  Doppler negative for DVT and IV heparin discontinued.  INR remains sub-therapeutic but is trending up.  No bleeding reported.    Goal of Therapy:  INR 2 - 3 Monitor platelets by anticoagulation protocol: Yes    Plan:  Repeat Coumadin 7.5mg  PO today Daily PT / INR   Gillian Kluever D. Mina Marble, PharmD, BCPS Pager:  [phone] 07/21/2017, 9:46 AM  ==================================   Addendum: Pharmacy consulted to resume Zosyn for cellulitis ESRD on HD MWF Zosyn EID 3.375gm IV Q12H   Jair Lindblad D. Mina Marble, PharmD, BCPS Pager:  [phone] 07/21/2017, 2:39 PM

## 2017-07-21 NOTE — Discharge Instructions (Addendum)

## 2017-07-22 ENCOUNTER — Inpatient Hospital Stay (HOSPITAL_COMMUNITY): Payer: Medicare Other

## 2017-07-22 ENCOUNTER — Encounter: Payer: Self-pay | Admitting: Cardiology

## 2017-07-22 ENCOUNTER — Encounter (HOSPITAL_COMMUNITY): Payer: Self-pay | Admitting: Interventional Radiology

## 2017-07-22 DIAGNOSIS — G92 Toxic encephalopathy: Secondary | ICD-10-CM

## 2017-07-22 DIAGNOSIS — E1152 Type 2 diabetes mellitus with diabetic peripheral angiopathy with gangrene: Secondary | ICD-10-CM

## 2017-07-22 HISTORY — PX: IR DIALY SHUNT INTRO NEEDLE/INTRACATH INITIAL W/IMG LEFT: IMG6102

## 2017-07-22 LAB — RENAL FUNCTION PANEL
ALBUMIN: 2.9 g/dL — AB (ref 3.5–5.0)
Anion gap: 19 — ABNORMAL HIGH (ref 5–15)
BUN: 50 mg/dL — ABNORMAL HIGH (ref 6–20)
CALCIUM: 9.5 mg/dL (ref 8.9–10.3)
CO2: 24 mmol/L (ref 22–32)
CREATININE: 7.35 mg/dL — AB (ref 0.61–1.24)
Chloride: 94 mmol/L — ABNORMAL LOW (ref 101–111)
GFR calc Af Amer: 9 mL/min — ABNORMAL LOW (ref >60)
GFR, EST NON AFRICAN AMERICAN: 8 mL/min — AB (ref >60)
Glucose, Bld: 74 mg/dL (ref 65–99)
Phosphorus: 10.2 mg/dL — ABNORMAL HIGH (ref 2.5–4.6)
Potassium: 4.8 mmol/L (ref 3.5–5.1)
SODIUM: 137 mmol/L (ref 135–145)

## 2017-07-22 LAB — CBC
HCT: 34.2 % — ABNORMAL LOW (ref 39.0–52.0)
HCT: 34 % — ABNORMAL LOW (ref 39.0–52.0)
Hemoglobin: 10.8 g/dL — ABNORMAL LOW (ref 13.0–17.0)
Hemoglobin: 10.5 g/dL — ABNORMAL LOW (ref 13.0–17.0)
MCH: 27.6 pg (ref 26.0–34.0)
MCH: 27.1 pg (ref 26.0–34.0)
MCHC: 31.6 g/dL (ref 30.0–36.0)
MCHC: 30.9 g/dL (ref 30.0–36.0)
MCV: 87.5 fL (ref 78.0–100.0)
MCV: 87.9 fL (ref 78.0–100.0)
PLATELETS: 495 10*3/uL — AB (ref 150–400)
PLATELETS: 475 10*3/uL — AB (ref 150–400)
RBC: 3.91 MIL/uL — AB (ref 4.22–5.81)
RBC: 3.87 MIL/uL — AB (ref 4.22–5.81)
RDW: 18.8 % — ABNORMAL HIGH (ref 11.5–15.5)
RDW: 19.2 % — ABNORMAL HIGH (ref 11.5–15.5)
WBC: 9.9 10*3/uL (ref 4.0–10.5)
WBC: 9 10*3/uL (ref 4.0–10.5)

## 2017-07-22 LAB — GLUCOSE, CAPILLARY
GLUCOSE-CAPILLARY: 53 mg/dL — AB (ref 65–99)
GLUCOSE-CAPILLARY: 54 mg/dL — AB (ref 65–99)
GLUCOSE-CAPILLARY: 86 mg/dL (ref 65–99)
Glucose-Capillary: 133 mg/dL — ABNORMAL HIGH (ref 65–99)
Glucose-Capillary: 55 mg/dL — ABNORMAL LOW (ref 65–99)

## 2017-07-22 LAB — BLOOD GAS, ARTERIAL
ACID-BASE DEFICIT: 0.5 mmol/L (ref 0.0–2.0)
Bicarbonate: 24.1 mmol/L (ref 20.0–28.0)
DRAWN BY: 24486
FIO2: 21
O2 SAT: 95.2 %
PCO2 ART: 42.5 mmHg (ref 32.0–48.0)
Patient temperature: 98.6
pH, Arterial: 7.372 (ref 7.350–7.450)
pO2, Arterial: 84.5 mmHg (ref 83.0–108.0)

## 2017-07-22 LAB — BASIC METABOLIC PANEL
ANION GAP: 21 — AB (ref 5–15)
BUN: 50 mg/dL — ABNORMAL HIGH (ref 6–20)
CO2: 22 mmol/L (ref 22–32)
Calcium: 9.5 mg/dL (ref 8.9–10.3)
Chloride: 93 mmol/L — ABNORMAL LOW (ref 101–111)
Creatinine, Ser: 7.35 mg/dL — ABNORMAL HIGH (ref 0.61–1.24)
GFR, EST AFRICAN AMERICAN: 9 mL/min — AB (ref >60)
GFR, EST NON AFRICAN AMERICAN: 8 mL/min — AB (ref >60)
Glucose, Bld: 76 mg/dL (ref 65–99)
Potassium: 4.6 mmol/L (ref 3.5–5.1)
SODIUM: 136 mmol/L (ref 135–145)

## 2017-07-22 LAB — PROTIME-INR
INR: 1.59
PROTHROMBIN TIME: 18.8 s — AB (ref 11.4–15.2)

## 2017-07-22 LAB — AMMONIA: AMMONIA: 41 umol/L — AB (ref 9–35)

## 2017-07-22 LAB — VALPROIC ACID LEVEL: Valproic Acid Lvl: <10 ug/mL — ABNORMAL LOW (ref 50.0–100.0)

## 2017-07-22 MED ORDER — SODIUM CHLORIDE 0.9 % IV SOLN: 100.0000 mL | INTRAVENOUS | Status: DC | PRN

## 2017-07-22 MED ORDER — DEXTROSE 50 % IV SOLN
INTRAVENOUS | Status: AC
  Filled 2017-07-22: qty 50

## 2017-07-22 MED ORDER — IOPAMIDOL (ISOVUE-300) INJECTION 61%
INTRAVENOUS | Status: AC
  Administered 2017-07-22: 75 mL
  Filled 2017-07-22: qty 100

## 2017-07-22 MED ORDER — PENTAFLUOROPROP-TETRAFLUOROETH EX AERO: 1.0000 "application " | INHALATION_SPRAY | CUTANEOUS | Status: DC | PRN

## 2017-07-22 MED ORDER — HEPARIN SODIUM (PORCINE) 1000 UNIT/ML DIALYSIS: 1000.0000 [IU] | INTRAMUSCULAR | Status: DC | PRN

## 2017-07-22 MED ORDER — MIDAZOLAM HCL 2 MG/2ML IJ SOLN
INTRAMUSCULAR | Status: AC
  Filled 2017-07-22: qty 6

## 2017-07-22 MED ORDER — NALOXONE HCL 0.4 MG/ML IJ SOLN
INTRAMUSCULAR | Status: AC
  Filled 2017-07-22: qty 1

## 2017-07-22 MED ORDER — FENTANYL CITRATE (PF) 100 MCG/2ML IJ SOLN
INTRAMUSCULAR | Status: AC
  Filled 2017-07-22: qty 4

## 2017-07-22 MED ORDER — DEXTROSE 50 % IV SOLN
1.0000 | INTRAVENOUS | Status: AC
  Administered 2017-07-22: 50 mL via INTRAVENOUS

## 2017-07-22 MED ORDER — LIDOCAINE HCL (PF) 1 % IJ SOLN: 5.0000 mL | INTRAMUSCULAR | Status: DC | PRN

## 2017-07-22 MED ORDER — CEPHALEXIN 500 MG PO CAPS: 500.0000 mg | ORAL_CAPSULE | Freq: Every day | ORAL | 0 refills | Status: DC

## 2017-07-22 MED ORDER — ALTEPLASE 2 MG IJ SOLR: 2.0000 mg | Freq: Once | INTRAMUSCULAR | Status: DC | PRN

## 2017-07-22 MED ORDER — WARFARIN SODIUM 5 MG PO TABS: 5.0000 mg | ORAL_TABLET | Freq: Every evening | ORAL | 0 refills | Status: DC

## 2017-07-22 MED ORDER — NALOXONE HCL 0.4 MG/ML IJ SOLN
INTRAMUSCULAR | Status: AC
  Administered 2017-07-22: 0.4 mg
  Filled 2017-07-22: qty 1

## 2017-07-22 MED ORDER — WARFARIN SODIUM 7.5 MG PO TABS: 7.5000 mg | ORAL_TABLET | Freq: Once | ORAL | Status: DC

## 2017-07-22 MED ORDER — LIDOCAINE-PRILOCAINE 2.5-2.5 % EX CREA: 1.0000 "application " | TOPICAL_CREAM | CUTANEOUS | Status: DC | PRN

## 2017-07-22 NOTE — Significant Event (Signed)
Rapid Response Event Note  Overview: Time Called: 8335 Arrival Time: 1610 Event Type: Neurologic  Initial Focused Assessment: Unable to arouse patient.  Will clamp down with mouth care. Lung sounds clear decreased bases Pupils pinpoint BP 118/66  HR 79  RR 14  O2 sat 97% on 2L [place]  Interventions: 0.4 mg narcan given IV Patient now moving all extremities, mildly restless, open eyes to command.  Mumbles answers.   (per son this is how he  Has frequently been while he is at the hospital) Incontinent of Large liquid, semisolid BM, and urine. No seizure activity noted ABG done  7.3/42/84/24 Labs drawn:  Ammonia and Valproic Acid  Patient again becoming more lethargic  Updated Dr Bonner Puna of labs and patient status.   No new orders  Plan of Care (if not transferred): Will continue to monitor patient Son plans on staying at bedside until his mother arrives.     Neuro consult   Event Summary: Name of Physician Notified: Dr Bonner Puna at 1606    at    Outcome: Stayed in room and stabalized  Event End Time: [place]  Raliegh Ip
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
## 2017-07-22 NOTE — Progress Notes (Addendum)
ABG reviewed, normal. Pt is resisting mouth care and clearly maintaining airway. VSS, no focal deficits noted. Instructed RN to hold further sedating medications. Per family, this is a recurrent episode of somnolence which he often exhibits during admissions. Neurology consult pending, labs pending. Plan remains for HD tomorrow. CBG low at 55mg /dl. Will give D50 and recheck frequently per hypoglycemia protocol. Also ordered q4h CBGs.  Vance Gather, MD 07/22/2017 6:47 PM     ADDENDUM: Will order MRI once able. Confirmed by EMR that pt's PPM is MRI conditional, safe for MRI brain. It is [employer] MRI  model [id number].  Vance Gather, MD 07/22/2017 7:01 PM

## 2017-07-22 NOTE — Progress Notes (Signed)
Patients blood sugar was 53, administered dextrose 28mL solution per STAT MD order. Will continue to monitor patient

## 2017-07-22 NOTE — Evaluation (Signed)
Physical Therapy Evaluation Patient Details Name: Hayden [name] MRN: [id number] DOB: 03/21/1971 Today's Date: 07/22/2017   History of Present Illness  Hayden [name] is a 47 y.o. 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   Clinical Impression  Pt admitted with above diagnosis. Pt currently with functional limitations due to the deficits listed below (see PT Problem List). Pt very lethargic on eval, even when mobility initiated and pt sitting up EOB. Opened eyes twice but could not be fully roused to participate. Max A +2 needed for all mobility due to lethargy. Pt not appropriate for d/c home today. Will follow tomorrow when he will hopefully be alert. Pt will benefit from skilled PT to increase their independence and safety with mobility to allow discharge to the venue listed below.       Follow Up Recommendations Outpatient PT as previously initiated PTA    Equipment Recommendations  None recommended by PT    Recommendations for Other Services       Precautions / Restrictions Precautions Precautions: Fall Precaution Comments: fell off EOB yesterday onto his head per son's report Restrictions Weight Bearing Restrictions: No      Mobility  Bed Mobility Overal bed mobility: Needs Assistance Bed Mobility: Supine to Sit;Sit to Supine Rolling: Max assist   Supine to sit: Max assist;+2 for physical assistance Sit to supine: Max assist;+2 for physical assistance   General bed mobility comments: pt's son assisted with bed mobility. Max A for R and L roll for pad to be placed under pt. Max A +2 for hips to EOB and elevation of trunk. Pt opened eyes twice as he came to sitting  but quickly was sleeping again once sitting up. Max A +2 to return to supine, pt with some moaning but still no eye opening or following commands.   Transfers                 General transfer comment: unable due to level of arousal  Ambulation/Gait             General Gait Details: has not been ambulating  Science writer    Modified Rankin (Stroke Patients Only)       Balance Overall balance assessment: Needs assistance Sitting-balance support: Bilateral upper extremity supported Sitting balance-Leahy Scale: Poor Sitting balance - Comments: max A to maintain sitting Postural control: Posterior lean                                   Pertinent Vitals/Pain Pain Assessment: Faces Pain Score: 0-No pain    Home Living Family/patient expects to be discharged to:: Private residence Living Arrangements: Spouse/significant other;Children Available Help at Discharge: Family;Available 24 hours/day Type of Home: Mobile home Home Access: Ramped entrance     Home Layout: One level Home Equipment: Grab bars - tub/shower;Hand held shower head;Walker - 2 wheels;Wheelchair - manual;Electric scooter;Crutches;Shower seat Additional Comments: history given by son who is his primary caregiver (wife works)    Prior Function Level of Independence: Needs Water engineer / Transfers Assistance Needed: has been  transferring surface to surface independently. Has just recived R prosthesis, just learning to use  ADL's / Homemaking Assistance Needed: son helps with getting in and out of tub if pt needs it        Hand Dominance   Dominant Hand: Right    Extremity/Trunk Assessment   Upper Extremity Assessment Upper Extremity Assessment: Difficult to assess due to impaired cognition    Lower Extremity Assessment Lower Extremity Assessment: RLE deficits/detail;LLE deficits/detail RLE Deficits / Details: AKA LLE Deficits / Details:  redness, blisters lower leg    Cervical / Trunk Assessment Cervical / Trunk Assessment: Normal  Communication   Communication: (not evaluated due to lethargy)  Cognition Arousal/Alertness: Lethargic Behavior During Therapy: Flat affect Overall Cognitive Status: Impaired/Different from baseline Area of Impairment: Following commands                       Following Commands: (followed no commands)       General Comments: pt very lethargic throughout session so cognition not able to be adequately assessed. Son reports cognition WFL PTA. Pt fell on head yesterday, CT neg for acute abnormalities.       General Comments General comments (skin integrity, edema, etc.): Pt's O2 was on his head upon PT arrival, O2 sats 89%. Maury replaced and O2 sats increased to 95%    Exercises     Assessment/Plan    PT Assessment Patient needs continued PT services  PT Problem List Decreased cognition;Decreased mobility;Decreased balance;Decreased activity tolerance       PT Treatment Interventions DME instruction;Functional mobility training;Therapeutic activities;Therapeutic exercise;Balance training;Patient/family education;Cognitive remediation    PT Goals (Current goals can be found in the Care Plan section)  Acute Rehab PT Goals Patient Stated Goal: son wants to take pt home to begin therapy for prosthesis PT Goal Formulation: With family Time For Goal Achievement: 08/05/17 Potential to Achieve Goals: Good    Frequency Min 3X/week   Barriers to discharge        Co-evaluation               AM-PAC PT "6 Clicks" Daily Activity  Outcome Measure Difficulty turning over in bed (including adjusting bedclothes, sheets and blankets)?: Unable Difficulty moving from lying on back to sitting on the side of the bed? : Unable Difficulty sitting down on and standing up from a chair with arms (e.g., wheelchair, bedside commode, etc,.)?: Unable Help needed moving to and from a bed to  chair (including a wheelchair)?: Total Help needed walking in hospital room?: Total Help needed climbing 3-5 steps with a railing? : Total 6 Click Score: 6    End of Session   Activity Tolerance: Patient limited by lethargy Patient left: in bed;with call bell/phone within reach;with bed alarm set;with family/visitor present Nurse Communication: Mobility status;Other (comment)(cognition) PT Visit Diagnosis: Other abnormalities of gait and mobility (R26.89)    Time: 1884-1660 PT Time Calculation (min) (ACUTE ONLY): 23 min   Charges:   PT Evaluation $PT Eval Moderate Complexity: 1 Mod     PT G Codes:        Leighton Roach, PT  Acute Rehab Services  [place] 07/22/2017, 4:10 PM

## 2017-07-22 NOTE — Progress Notes (Signed)
Blood sugar rechecked, 133.

## 2017-07-22 NOTE — Progress Notes (Signed)
Patient lethargic, vitals WDL, on 2L of oxygen at 96%. PT notified nurse that they were unable to work with patient. MD, Dr. Auburn Bilberry notified of situation. Awaiting response and interventions.

## 2017-07-22 NOTE — Progress Notes (Signed)
Patient ID: Hayden [name], male   DOB: 12-25-1970, 47 y.o.   MRN: [id number]   Pt was scheduled for left arm dialysis fistula evaluation with possible angioplasty/stent placement. Possible dialysis catheter placement.  Was seen in dialysis in am yesterday---groggy an d sleep; calm In IR yesterday after dialysis---pt became restless and uncontrollable Procedure was not performed.  This am--pt is calm Wanting food--- very hungry Wife at bedside Seems agreeable to continue NPO til after procedure can be performed

## 2017-07-22 NOTE — Progress Notes (Signed)
ANTICOAGULATION CONSULT NOTE - Follow Up Consult  Pharmacy Consult:  Coumadin Indication:  Afib  Allergies  Allergen Reactions  . Dilaudid [Hydromorphone Hcl] Other (See Comments)    ABNORMAL BEHAVIOR, "VERBALLY AND PHYSICALLY ABUSIVE," PSYCHOSIS  . Morphine And Related Itching  . Adhesive [Tape] Other (See Comments)    Redness from adhesive tape if left on too long, paper tape is preferred    Patient Measurements: Height: 5\' 10"  (177.8 cm) Weight: 223 lb 8.7 oz (101.4 kg) IBW/kg (Calculated) : 73  Vital Signs: Temp: 97.4 F (36.3 C) (01/22 0439) Temp Source: Axillary (01/22 0439) BP: 145/86 (01/22 0439) Pulse Rate: 91 (01/22 0439)  Labs: Recent Labs    07/20/17 0929 07/20/17 2125 07/21/17 0900 07/22/17 0654  HGB 10.1*  --  10.2* 10.8*  HCT 33.6*  --  32.9* 34.2*  PLT 466*  --  499* 495*  LABPROT 15.6*  --  16.5* 18.8*  INR 1.25  --  1.35 1.59  HEPARINUNFRC <0.10* <0.10*  --   --   CREATININE 8.67*  --  9.93* 7.35*    Estimated Creatinine Clearance: 15.2 mL/min (A) (by C-G formula based on SCr of 7.35 mg/dL (H)).    Assessment: 85 YOM on Coumadin PTA for history of AFib.  INR remains sub-therapeutic but is trending up.  No bleeding reported.    Goal of Therapy:  INR 2 - 3 Monitor platelets by anticoagulation protocol: Yes    Plan:  Repeat Coumadin 7.5mg  PO today.  Increase dose in AM if response is minimal. Daily PT / INR If dialysis catheter placements is postponed again, consider holding Coumadin   Max Nuno D. Mina Marble, PharmD, BCPS Pager:  [phone] 07/22/2017, 7:58 AM

## 2017-07-22 NOTE — Progress Notes (Signed)
PROGRESS NOTE  Hayden [name]  HBZ:[id number] DOB: 12/13/1970 DOA: 07/19/2017 PCP: [name], DO   Brief Narrative: Hayden [name] is a 47 y.o. 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 XR showed calcified vessels without evidence of emphysema or fracture. Vancomycin and zosyn were given, nephrology consulted for HD while inpatient, and he was admitted. Fistulogram performed 1/22 showed widely patent AVF circuit. Plan was for discharge to home though the patient grew more lethargic following the procedure. Neurology is consulted.   Assessment & Plan: Cellulitis and abscess of left lower extremity: He has risk factors including h/o DM (reports not treated now), atherosclerosis of the limbs and h/o gangrene in the right limb requiring amputation. U/S negative for DVT.  - Erythema improving. No abscess. Continue vanc/zosyn.  - Monitor cultures.   Acute metabolic encephalopathy: With waxing/waning course, suspect delirium possibly worsened by sedating medications. Also possibly hyperammonemia due to depakote. No seizure-like activity reported.  - Check ammonia, valpro level, ABG STAT.  - Appears to be maintaining airway. If hypercarbic, would need BiPAP and transfer to SDU.  - Discussed with neurology, Dr. Rory Percy, who will consult on patient today.   ESRD on hemodialysis with mild hyperkalemia and mild anemia - Nephrology consulted: received HD 1/21, planning return 1/23.  - Continuing lasix.  Subtherapeutic INR: He is on coumadin for his vascular disease and Aflutter with a high Chads-vasc score.  He has been out of his coumadin and INR is 1.2 - Restarted coumadin. Appreciate  pharmacy assistance with dosing.  History of CVA: Minimal residual deficits. CT head 1/21 without acute abnormality.  - Continue statin, coumadin    Above knee amputation of right lower extremity  - PT evaluation    Complete heart block (HCC) - s/p PPM - No signs pacemaker is malfunctioning.      Atrial flutter: CHA2DS2-VASc Scoreis 5 - HR is well controlled, continue coreg.      Hyperlipidemia: - Continue statin.   Bipolar/depression - Continue depakote and lexapro  DVT prophylaxis: Coumadin Code Status: Full Family Communication: Wife at bedside this AM, Son at bedside on reevaluation this PM Disposition Plan: Remain inpatient; location contingent on blood gas results.   Consultants:   Nephrology  IR  Procedures:   HD 07/21/2017  Antimicrobials:  Vancomycin, Zosyn   Subjective: Called by RN for lethargy, reportedly by son the patient has been somnolent since returning from fistulogram today. Some mild improvement with narcan.   Objective: Vitals:   07/21/17 1955 07/22/17 0439 07/22/17 1300 07/22/17 1622  BP: 134/72 (!) 145/86 102/68 118/66  Pulse: (!) 101 91 79 76  Resp: 20 20 20    Temp: 97.9 F (36.6 C) (!) 97.4 F (36.3 C) 97.6 F (36.4 C)   TempSrc: Oral Axillary Axillary   SpO2: 92% 99% 99%   Weight:      Height:        Intake/Output Summary (Last 24 hours) at 07/22/2017 1624 Last data filed at 07/22/2017 1515 Gross per 24 hour  Intake 300 ml  Output -  Net 300 ml   Filed Weights   07/20/17 0348 07/21/17 0830 07/21/17 1252  Weight: 105 kg (231 lb 7.7 oz) 106.8 kg (235 lb  7.2 oz) 101.4 kg (223 lb 8.7 oz)   Gen: Chronically ill-appearing 47 y.o. male poorly responsive Pulm: Non-labored breathing. Clear to auscultation bilaterally.  CV: Regular rate and rhythm. No murmur, rub, or gallop. No JVD. GI: Abdomen soft, nondistended, +BS.   Ext: RLE amputation site stable, normal appearing. Left shin and ankle with blanchable erythema and induration  without palpable abscess. No pururlence noted. He withdraws leg to pain.  Skin: As above Neuro: Drowsy, will open eyes to verbal stimuli, localizes to pain. No seizure-like activity noted.  Psych: UTD  CBC: Recent Labs  Lab 07/19/17 2018 07/20/17 0929 07/21/17 0900 07/22/17 0654 07/22/17 0734  WBC 8.2 9.7 11.5* 9.9 9.0  NEUTROABS 5.4  --   --   --   --   HGB 11.2* 10.1* 10.2* 10.8* 10.5*  HCT 35.8* 33.6* 32.9* 34.2* 34.0*  MCV 88.4 89.1 88.9 87.5 87.9  PLT 462* 466* 499* 495* 195*   Basic Metabolic Panel: Recent Labs  Lab 07/19/17 2018 07/20/17 0929 07/21/17 0900 07/22/17 0654 07/22/17 0734  NA 138 138 138 136 137  K 5.2* 5.9* 6.7* 4.6 4.8  CL 99* 97* 98* 93* 94*  CO2 23 22 21* 22 24  GLUCOSE 119* 97 91 76 74  BUN 63* 69* 86* 50* 50*  CREATININE 7.97* 8.67* 9.93* 7.35* 7.35*  CALCIUM 9.2 8.9 8.7* 9.5 9.5  PHOS  --   --  12.8*  --  10.2*   Liver Function Tests: Recent Labs  Lab 07/19/17 2018 07/21/17 0900 07/22/17 0734  AST 14*  --   --   ALT 13*  --   --   ALKPHOS 173*  --   --   BILITOT 1.1  --   --   PROT 7.0  --   --   ALBUMIN 3.0* 2.9* 2.9*   Radiology Studies: Ct Head Wo Contrast  Result Date: 07/21/2017 CLINICAL DATA:  Altered level of consciousness and confusion. EXAM: CT HEAD WITHOUT CONTRAST TECHNIQUE: Contiguous axial images were obtained from the base of the skull through the vertex without intravenous contrast. COMPARISON:  05/16/2017 FINDINGS: Brain: Stable small lacunar infarct of right basal ganglia. Old lacunar infarcts also suspected in the left cerebellar hemisphere and left thalamus. The brain demonstrates no evidence of hemorrhage, acute infarction, edema, mass effect, extra-axial fluid collection, hydrocephalus or mass lesion. Vascular: Stable calcifications involving the intracranial internal carotid arteries, basilar artery and vertebral arteries. No hyperdense thrombus identified. Skull: Normal. Negative for fracture or focal lesion.  Sinuses/Orbits: No acute finding. Other: None. IMPRESSION: No acute findings.  Old lacunar infarcts present. Electronically Signed   By: Aletta Edouard M.D.   On: 07/21/2017 18:11   Ir Dialy Shunt Intro Needle/intracath Initial W/img Left  Result Date: 07/22/2017 INDICATION: Prolonged bleeding after dialysis. EXAM: LEFT WRIST AV FISTULOGRAM MEDICATIONS: None.  75 cc Isovue 300. ANESTHESIA/SEDATION: None FLUOROSCOPY TIME:  Fluoroscopy Time:  minutes 12 seconds (102 mGy). COMPLICATIONS: None immediate. PROCEDURE: Informed written consent was obtained from the patient after a thorough discussion of the procedural risks, benefits and alternatives. All questions were addressed. Maximal Sterile Barrier Technique was utilized including caps, mask, sterile gowns, sterile gloves, sterile drape, hand hygiene and skin antiseptic. A timeout was performed prior to the initiation of the procedure. The left wrist was prepped and draped in a sterile fashion. An 18 gauge Angiocath was inserted into the outflow forearm vein. Contrast was injected for imaging. The Angiocath was removed and hemostasis was achieved with direct pressure. FINDINGS:  The wrist arteriovenous anastomosis, outflow forearm veins, upper arm veins, and central venous structures are all widely patent. There are multiple competing venous structures in the forearm. IMPRESSION: Left wrist AV fistula circuit is widely patent. ACCESS: This access remains amenable to future percutaneous interventions as clinically indicated. Electronically Signed   By: Marybelle Killings M.D.   On: 07/22/2017 12:14    Scheduled Meds: . naloxone      . amLODipine  10 mg Oral Daily  . carvedilol  25 mg Oral BID WC  . cinacalcet  60 mg Oral Q breakfast  . cloNIDine  0.2 mg Transdermal Q Tue  . divalproex  500 mg Oral Q12H  . escitalopram  10 mg Oral Daily  . ferric citrate  420 mg Oral TID WC  . furosemide  120 mg Oral BID  . gabapentin  100 mg Oral BID  . hydrALAZINE  100 mg  Oral Q8H  . irbesartan  300 mg Oral Daily  . isosorbide dinitrate  10 mg Oral BID  . multivitamin  1 tablet Oral QHS  . pravastatin  40 mg Oral Daily  . warfarin  7.5 mg Oral ONCE-1800  . Warfarin - Pharmacist Dosing Inpatient   Does not apply q1800   Continuous Infusions: . sodium chloride    . sodium chloride    . piperacillin-tazobactam (ZOSYN)  IV Stopped (07/22/17 1516)  . vancomycin Stopped (07/21/17 1852)     LOS: 2 days   Time spent: 25 minutes.  Vance Gather, MD [name] Pager [phone]  If 7PM-7AM, please contact night-coverage [email or web address] Password [facility] 07/22/2017, 4:24 PM

## 2017-07-22 NOTE — Discharge Summary (Signed)
Physician Discharge Summary  Hayden [name] ZTI:[id number] DOB: [birth year] DOA: 07/19/2017  PCP: [name], DO  Admit date: 07/19/2017 Discharge date: 07/22/2017  Admitted From: Home Disposition: Home   Recommendations for Outpatient Follow-up:  1. Follow up with PCP in 1-2 weeks. 2. Continue routine HD. Will need to receive vancomycin 1g IV with HD through 1/30 or pending clinical progress. Will also take keflex 500mg  q24h (after HD on MWF). 3. Monitor blood cultures (NGTD at discharge)   Home Health: None Equipment/Devices: None new Discharge Condition: Stable CODE STATUS: Full Diet recommendation: Fluid restriction, renal.   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 XR showed calcified vessels without evidence of emphysema or fracture. Vancomycin and zosyn were given, nephrology consulted for HD while inpatient, and he was admitted. Fistulogram was performed while admitted on 1/22 showing widely patent system. Erythema and pain improved with vancomycin, leukocytosis resolved, and fever curve improved (99.67F > 97.31F). He exhibited some acute delirium which his wife reports is normal for him during admissions. Because neurologic exam could not be performed due to incooperativity 1/21, CT head was performed showing stable remote infarct. He does not appear to have had seizure-like activity and today exhibits no focal deficits.     Discharge Diagnoses:  Active Problems:   History of partial Right nephrectomy for renal mass (2008)   End stage renal disease on dialysis [facility])   History of CVA (cerebrovascular accident)    Atherosclerosis of native arteries of the extremities with ulceration ([place])   Above knee amputation of right lower extremity (HCC)   ESRD on hemodialysis ([place])   Complete heart block ([place]) - s/p PPM   Atrial flutter ([place]); CHA2DS2Vasc -5, on warfarin   Hyperlipidemia with target low density lipoprotein (LDL) cholesterol less than 70 mg/dL   Cellulitis and abscess of left lower extremity  Cellulitis of left lower extremity: He has risk factors including h/o DM (reports not treated now), atherosclerosis of the limbs and h/o gangrene in the right limb requiring amputation. U/S negative for DVT.  - Erythema receding, wife reports improvement in warmth. No fluctuance. Will continue vancomycin 1g every MWF at HD, add Keflex 500mg  q24h (after HD on MWF).  - Monitor cultures.   ESRD on hemodialysiswith mild hyperkalemia (resolved) and mild anemia - Nephrology consulted: Received HD 1/21.  - Continuing lasix.  Subtherapeutic INR: He is on coumadin for his vascular disease and Aflutter with a high Chads-vasc score. He has been out of his coumadin and INR is 1.2 - Restarted coumadin. Appreciate pharmacy assistance with dosing while inpatient. - New Rx for coumadin provided  History of CVA: Minimal residual deficits. CT head without acute changes 1/21.  - Continue statin, coumadin  Above knee amputation of right lower extremity - Noted  Complete heart block (HCC) - s/p PPM - No signs pacemaker is malfunctioning.   Atrial flutter: CHA2DS2-VASc Scoreis 5 - HR is well controlled, continue coreg.  Hyperlipidemia: - Continue statin.   Bipolar/depression - Continue depakote and lexapro  Discharge Instructions Discharge Instructions    Diet - low sodium heart healthy   Complete by:  As directed    Diet Carb Modified  Complete by:  As directed    Discharge instructions   Complete by:  As directed    You were admitted for cellulitis of the left lower leg which has improved.  You are stable for discharge with ongoing antibiotics. the fistulogram showed no abnormalities! - You will continue to receive vancomycin on hemodialysis days - You will need to take keflex once daily as well. You need to take this AFTER dialysis on MWF.  - Continue taking coumadin. A refill was sent to your pharmacy, though you will need to follow up with your outpatient providers for ongoing care.  - The redness and pain will very slowly improve. If you experience a fever or the area becomes more painful/red, seek medical attention.   Increase activity slowly   Complete by:  As directed      Allergies as of 07/22/2017      Reactions   Dilaudid [hydromorphone Hcl] Other (See Comments)   ABNORMAL BEHAVIOR, "VERBALLY AND PHYSICALLY ABUSIVE," PSYCHOSIS   Morphine And Related Itching   Adhesive [tape] Other (See Comments)   Redness from adhesive tape if left on too long, paper tape is preferred      Medication List    TAKE these medications   ALPRAZolam 1 MG tablet Commonly known as:  XANAX Take 1 mg by mouth 3 (three) times daily as needed for anxiety or sleep.   amLODipine 10 MG tablet Commonly known as:  NORVASC Take 1 tablet (10 mg total) by mouth daily.   carvedilol 25 MG tablet Commonly known as:  COREG Take 1 tablet (25 mg total) by mouth 2 (two) times daily.   cephALEXin 500 MG capsule Commonly known as:  KEFLEX Take 1 capsule (500 mg total) by mouth daily. Take AFTER (not before) HD on [name]-F.   cinacalcet 60 MG tablet Commonly known as:  SENSIPAR Take 1 tablet (60 mg total) by mouth daily.   cloNIDine 0.2 mg/24hr patch Commonly known as:  CATAPRES - Dosed in mg/24 hr Place 1 patch (0.2 mg total) onto the skin once a week. What changed:  when to take this   divalproex 125 MG capsule Commonly known as:  DEPAKOTE SPRINKLE Take 4 capsules (500 mg total) by mouth every 12 (twelve) hours.   escitalopram 10 MG tablet Commonly known as:  LEXAPRO Take 10 mg daily by  mouth.   ferric citrate 1 GM 210 MG(Fe) tablet Commonly known as:  AURYXIA Take 2 tablets (420 mg total) by mouth 3 (three) times daily with meals. What changed:    how much to take  when to take this  additional instructions   furosemide 80 MG tablet Commonly known as:  LASIX Take 120 mg by mouth 2 (two) times daily.   gabapentin 100 MG capsule Commonly known as:  NEURONTIN Take 1 capsule (100 mg total) by mouth 3 (three) times daily. What changed:    when to take this  additional instructions   hydrALAZINE 100 MG tablet Commonly known as:  APRESOLINE Take 1 tablet (100 mg total) by mouth every 8 (eight) hours.   irbesartan 300 MG tablet Commonly known as:  AVAPRO Take 1 tablet (300 mg total) daily by mouth.   isosorbide dinitrate 10 MG tablet Commonly known as:  ISORDIL TAKE 1 TABLET BY MOUTH TWICE DAILY   multivitamin Tabs tablet Take 1 tablet by mouth at bedtime.   nitroGLYCERIN 0.4 MG SL tablet Commonly known as:  NITROSTAT Place 0.4 mg under the tongue every 5 (five)  minutes as needed for chest pain.   omeprazole 40 MG capsule Commonly known as:  PRILOSEC Take 1 capsule (40 mg total) by mouth daily.   oxyCODONE-acetaminophen 5-325 MG tablet Commonly known as:  PERCOCET/ROXICET Take 1-2 tablets by mouth every 4 (four) hours as needed for severe pain.   pravastatin 40 MG tablet Commonly known as:  PRAVACHOL Take 1 tablet (40 mg total) by mouth every evening. What changed:  when to take this   traMADol 50 MG tablet Commonly known as:  ULTRAM Take 1 tablet (50 mg total) by mouth every 6 (six) hours as needed for severe pain.   warfarin 5 MG tablet Commonly known as:  COUMADIN Take as directed. If you are unsure how to take this medication, talk to your nurse or doctor. Original instructions:  Take 1-1.5 tablets (5-7.5 mg total) by mouth every evening. Take 1 to 1.5 tablets by mouth daily as directed by coumadin clinic What changed:    how much to  take  how to take this  when to take this      Follow-up Information    Le, Thao P, DO. Schedule an appointment as soon as possible for a visit in 1 week(s).   Specialty:  Family Medicine Contact information: [place] [address] [phone]          Allergies  Allergen Reactions  . Dilaudid [Hydromorphone Hcl] Other (See Comments)    ABNORMAL BEHAVIOR, "VERBALLY AND PHYSICALLY ABUSIVE," PSYCHOSIS  . Morphine And Related Itching  . Adhesive [Tape] Other (See Comments)    Redness from adhesive tape if left on too long, paper tape is preferred    Consultations:  Nephrology  IR  Procedures/Studies: Dg Tibia/fibula Left  Result Date: 07/20/2017 CLINICAL DATA:  Cellulitis EXAM: LEFT TIBIA AND FIBULA - 2 VIEW COMPARISON:  None. FINDINGS: There is no evidence of fracture or other focal bone lesions. No soft tissue emphysema. Extensive vascular and soft tissue calcification. IMPRESSION: No soft tissue emphysema.  Severe vascular calcification. Electronically Signed   By: Ulyses Jarred M.D.   On: 07/20/2017 00:08   Ct Head Wo Contrast  Result Date: 07/21/2017 CLINICAL DATA:  Altered level of consciousness and confusion. EXAM: CT HEAD WITHOUT CONTRAST TECHNIQUE: Contiguous axial images were obtained from the base of the skull through the vertex without intravenous contrast. COMPARISON:  05/16/2017 FINDINGS: Brain: Stable small lacunar infarct of right basal ganglia. Old lacunar infarcts also suspected in the left cerebellar hemisphere and left thalamus. The brain demonstrates no evidence of hemorrhage, acute infarction, edema, mass effect, extra-axial fluid collection, hydrocephalus or mass lesion. Vascular: Stable calcifications involving the intracranial internal carotid arteries, basilar artery and vertebral arteries. No hyperdense thrombus identified. Skull: Normal. Negative for fracture or focal lesion. Sinuses/Orbits: No acute finding. Other: None. IMPRESSION:  No acute findings.  Old lacunar infarcts present. Electronically Signed   By: Aletta Edouard M.D.   On: 07/21/2017 18:11   Ir Dialy Shunt Intro Needle/intracath Initial W/img Left  Result Date: 07/22/2017 INDICATION: Prolonged bleeding after dialysis. EXAM: LEFT WRIST AV FISTULOGRAM MEDICATIONS: None.  75 cc Isovue 300. ANESTHESIA/SEDATION: None FLUOROSCOPY TIME:  Fluoroscopy Time:  minutes 12 seconds (102 mGy). COMPLICATIONS: None immediate. PROCEDURE: Informed written consent was obtained from the patient after a thorough discussion of the procedural risks, benefits and alternatives. All questions were addressed. Maximal Sterile Barrier Technique was utilized including caps, mask, sterile gowns, sterile gloves, sterile drape, hand hygiene and skin antiseptic. A timeout  was performed prior to the initiation of the procedure. The left wrist was prepped and draped in a sterile fashion. An 18 gauge Angiocath was inserted into the outflow forearm vein. Contrast was injected for imaging. The Angiocath was removed and hemostasis was achieved with direct pressure. FINDINGS: The wrist arteriovenous anastomosis, outflow forearm veins, upper arm veins, and central venous structures are all widely patent. There are multiple competing venous structures in the forearm. IMPRESSION: Left wrist AV fistula circuit is widely patent. ACCESS: This access remains amenable to future percutaneous interventions as clinically indicated. Electronically Signed   By: Marybelle Killings M.D.   On: 07/22/2017 12:14   Subjective: Angry about being NPO this morning. Slept poorly as he usually dose in the hospital. Wife at bedside reports improvement in redness of left lower leg. Also reports he becomes agitated/delirious with every admission and wants him to be discharged as soon as possible.   Discharge Exam: Vitals:   07/22/17 0439 07/22/17 1300  BP: (!) 145/86 102/68  Pulse: 91 79  Resp: 20 20  Temp: (!) 97.4 F (36.3 C) 97.6 F  (36.4 C)  SpO2: 99% 99%   General: Pt is alert, awake, not in acute distress Cardiovascular: RRR, S1/S2 +, no rubs, no gallops Respiratory: CTA bilaterally, no wheezing, no rhonchi Abdominal: Soft, NT, ND, bowel sounds + Extremities: RLE amputation site normal. Left lower leg with circumferential blanchable erythema and induration, tender, surrounding superficial wounds without purulence/fluctuance. Neuro: No focal deficits in strength or sensation. Alert and oriented, agitated but cooperative with exam.   Labs: BNP (last 3 results) No results for input(s): BNP in the last 8760 hours. Basic Metabolic Panel: Recent Labs  Lab 07/19/17 2018 07/20/17 0929 07/21/17 0900 07/22/17 0654 07/22/17 0734  NA 138 138 138 136 137  K 5.2* 5.9* 6.7* 4.6 4.8  CL 99* 97* 98* 93* 94*  CO2 23 22 21* 22 24  GLUCOSE 119* 97 91 76 74  BUN 63* 69* 86* 50* 50*  CREATININE 7.97* 8.67* 9.93* 7.35* 7.35*  CALCIUM 9.2 8.9 8.7* 9.5 9.5  PHOS  --   --  12.8*  --  10.2*   Liver Function Tests: Recent Labs  Lab 07/19/17 2018 07/21/17 0900 07/22/17 0734  AST 14*  --   --   ALT 13*  --   --   ALKPHOS 173*  --   --   BILITOT 1.1  --   --   PROT 7.0  --   --   ALBUMIN 3.0* 2.9* 2.9*   No results for input(s): LIPASE, AMYLASE in the last 168 hours. No results for input(s): AMMONIA in the last 168 hours. CBC: Recent Labs  Lab 07/19/17 2018 07/20/17 0929 07/21/17 0900 07/22/17 0654 07/22/17 0734  WBC 8.2 9.7 11.5* 9.9 9.0  NEUTROABS 5.4  --   --   --   --   HGB 11.2* 10.1* 10.2* 10.8* 10.5*  HCT 35.8* 33.6* 32.9* 34.2* 34.0*  MCV 88.4 89.1 88.9 87.5 87.9  PLT 462* 466* 499* 495* 475*   Cardiac Enzymes: No results for input(s): CKTOTAL, CKMB, CKMBINDEX, TROPONINI in the last 168 hours. BNP: Invalid input(s): POCBNP CBG: Recent Labs  Lab 07/20/17 0617 07/21/17 0622 07/21/17 1251  GLUCAP 100* 99 80   D-Dimer No results for input(s): DDIMER in the last 72 hours. Hgb A1c No results  for input(s): HGBA1C in the last 72 hours. Lipid Profile No results for input(s): CHOL, HDL, LDLCALC, TRIG, CHOLHDL, LDLDIRECT in the last  72 hours. Thyroid function studies No results for input(s): TSH, T4TOTAL, T3FREE, THYROIDAB in the last 72 hours.  Invalid input(s): FREET3 Anemia work up No results for input(s): VITAMINB12, FOLATE, FERRITIN, TIBC, IRON, RETICCTPCT in the last 72 hours. Urinalysis    Component Value Date/Time   COLORURINE AMBER (A) 11/04/2015 1930   APPEARANCEUR TURBID (A) 11/04/2015 1930   LABSPEC 1.019 11/04/2015 1930   PHURINE 5.5 11/04/2015 1930   GLUCOSEU 100 (A) 11/04/2015 1930   HGBUR NEGATIVE 11/04/2015 1930   BILIRUBINUR NEGATIVE 11/04/2015 [place] NEGATIVE 11/04/2015 1930   PROTEINUR >300 (A) 11/04/2015 1930   UROBILINOGEN 0.2 09/22/2012 1805   NITRITE NEGATIVE 11/04/2015 1930   LEUKOCYTESUR NEGATIVE 11/04/2015 1930    Microbiology Recent Results (from the past 240 hour(s))  Blood culture (routine x 2)     Status: None (Preliminary result)   Collection Time: 07/19/17 11:25 PM  Result Value Ref Range Status   Specimen Description BLOOD RIGHT ARM  Final   Special Requests   Final    BOTTLES DRAWN AEROBIC AND ANAEROBIC Blood Culture adequate volume   Culture NO GROWTH 2 DAYS  Final   Report Status PENDING  Incomplete  Blood culture (routine x 2)     Status: None (Preliminary result)   Collection Time: 07/19/17 11:30 PM  Result Value Ref Range Status   Specimen Description BLOOD RIGHT HAND  Final   Special Requests   Final    BOTTLES DRAWN AEROBIC ONLY Blood Culture adequate volume   Culture NO GROWTH 2 DAYS  Final   Report Status PENDING  Incomplete    Time coordinating discharge: Approximately 40 minutes  Vance Gather, MD  [name] 07/22/2017, 1:41 PM Pager [phone]

## 2017-07-22 NOTE — Progress Notes (Signed)
[facility] Progress Note  Subjective: remains lethargic  Vitals:   07/21/17 1300 07/21/17 1955 07/22/17 0439 07/22/17 1300  BP: (!) 162/94 134/72 (!) 145/86 102/68  Pulse: 93 (!) 101 91 79  Resp: 19 20 20 20   Temp: 98 F (36.7 C) 97.9 F (36.6 C) (!) 97.4 F (36.3 C) 97.6 F (36.4 C)  TempSrc: Oral Oral Axillary Axillary  SpO2: 92% 92% 99% 99%  Weight:      Height:        Inpatient medications: . amLODipine  10 mg Oral Daily  . carvedilol  25 mg Oral BID WC  . cinacalcet  60 mg Oral Q breakfast  . cloNIDine  0.2 mg Transdermal Q Tue  . divalproex  500 mg Oral Q12H  . escitalopram  10 mg Oral Daily  . ferric citrate  420 mg Oral TID WC  . furosemide  120 mg Oral BID  . gabapentin  100 mg Oral BID  . hydrALAZINE  100 mg Oral Q8H  . irbesartan  300 mg Oral Daily  . isosorbide dinitrate  10 mg Oral BID  . multivitamin  1 tablet Oral QHS  . pravastatin  40 mg Oral Daily  . warfarin  7.5 mg Oral ONCE-1800  . Warfarin - Pharmacist Dosing Inpatient   Does not apply q1800   . sodium chloride    . sodium chloride    . piperacillin-tazobactam (ZOSYN)  IV 3.375 g (07/22/17 1033)  . vancomycin Stopped (07/21/17 1852)   sodium chloride, sodium chloride, ALPRAZolam, alteplase, heparin, lidocaine (PF), lidocaine-prilocaine, nitroGLYCERIN, oxyCODONE-acetaminophen, pentafluoroprop-tetrafluoroeth, senna-docusate  Exam: Still lethargic, not responding No jvd Chest cta bilat RRR 3/6 holosyst M abd obese soft ntnd R AKA w/o edema, 2+ LLE edema, erythema L shin to ankle area L AVF+bruit  Dialysis: MWF NW 4h  450/800  98kg   2/2.25 bath  L AVF Hep 2400 - Hectoral 63mcg IV q HD - Venofer 100mg  x 10 ordered (4 given) - Mircera 224mcg IV q 2 weeks (last 1/18) -home BP Rx  norvasc 10, coreg 25 bid, clon patch 0.2 weekly, lasix 120 bid, hydral 100 tid, avapro 300       Impression: 1 Cellulitis 2 ESRD vol excess improving, K+ better, low kt/v at center 3 AMS hx  mania  4 Bipolar 5 HTN mult meds, bp ok, get vol down 6 Anemia 7 DM2 8 PVD 9 Aflutter  Plan - HD Wed, max uf,  fistulogram, abx   Kelly Splinter MD [facility] pager [phone]   07/22/2017, 1:58 PM   Recent Labs  Lab 07/21/17 0900 07/22/17 0654 07/22/17 0734  NA 138 136 137  K 6.7* 4.6 4.8  CL 98* 93* 94*  CO2 21* 22 24  GLUCOSE 91 76 74  BUN 86* 50* 50*  CREATININE 9.93* 7.35* 7.35*  CALCIUM 8.7* 9.5 9.5  PHOS 12.8*  --  10.2*   Recent Labs  Lab 07/19/17 2018 07/21/17 0900 07/22/17 0734  AST 14*  --   --   ALT 13*  --   --   ALKPHOS 173*  --   --   BILITOT 1.1  --   --   PROT 7.0  --   --   ALBUMIN 3.0* 2.9* 2.9*   Recent Labs  Lab 07/19/17 2018  07/21/17 0900 07/22/17 0654 07/22/17 0734  WBC 8.2   < > 11.5* 9.9 9.0  NEUTROABS 5.4  --   --   --   --  HGB 11.2*   < > 10.2* 10.8* 10.5*  HCT 35.8*   < > 32.9* 34.2* 34.0*  MCV 88.4   < > 88.9 87.5 87.9  PLT 462*   < > 499* 495* 475*   < > = values in this interval not displayed.   Iron/TIBC/Ferritin/ %Sat    Component Value Date/Time   IRON 18 (L) 12/05/2016 2039   TIBC 185 (L) 12/05/2016 2039   FERRITIN 1,444 (H) 11/15/2015 1342   IRONPCTSAT 10 (L) 12/05/2016 2039

## 2017-07-23 ENCOUNTER — Inpatient Hospital Stay (HOSPITAL_COMMUNITY): Payer: Medicare Other

## 2017-07-23 ENCOUNTER — Encounter (HOSPITAL_COMMUNITY): Payer: Self-pay | Admitting: Radiology

## 2017-07-23 DIAGNOSIS — R401 Stupor: Secondary | ICD-10-CM

## 2017-07-23 LAB — CBC
HEMATOCRIT: 35.7 % — AB (ref 39.0–52.0)
HEMOGLOBIN: 10.9 g/dL — AB (ref 13.0–17.0)
MCH: 27.2 pg (ref 26.0–34.0)
MCHC: 30.5 g/dL (ref 30.0–36.0)
MCV: 89 fL (ref 78.0–100.0)
Platelets: 483 10*3/uL — ABNORMAL HIGH (ref 150–400)
RBC: 4.01 MIL/uL — ABNORMAL LOW (ref 4.22–5.81)
RDW: 19.2 % — ABNORMAL HIGH (ref 11.5–15.5)
WBC: 8.4 10*3/uL (ref 4.0–10.5)

## 2017-07-23 LAB — PHOSPHORUS: Phosphorus: 12.6 mg/dL — ABNORMAL HIGH (ref 2.5–4.6)

## 2017-07-23 LAB — GLUCOSE, CAPILLARY
GLUCOSE-CAPILLARY: 100 mg/dL — AB (ref 65–99)
GLUCOSE-CAPILLARY: 102 mg/dL — AB (ref 65–99)
GLUCOSE-CAPILLARY: 114 mg/dL — AB (ref 65–99)
GLUCOSE-CAPILLARY: 79 mg/dL (ref 65–99)
Glucose-Capillary: 61 mg/dL — ABNORMAL LOW (ref 65–99)
Glucose-Capillary: 75 mg/dL (ref 65–99)
Glucose-Capillary: 92 mg/dL (ref 65–99)

## 2017-07-23 LAB — PROTIME-INR
INR: 2.04
Prothrombin Time: 22.9 seconds — ABNORMAL HIGH (ref 11.4–15.2)

## 2017-07-23 LAB — BASIC METABOLIC PANEL
Anion gap: 21 — ABNORMAL HIGH (ref 5–15)
BUN: 68 mg/dL — ABNORMAL HIGH (ref 6–20)
CO2: 20 mmol/L — ABNORMAL LOW (ref 22–32)
Calcium: 8.6 mg/dL — ABNORMAL LOW (ref 8.9–10.3)
Chloride: 94 mmol/L — ABNORMAL LOW (ref 101–111)
Creatinine, Ser: 8.64 mg/dL — ABNORMAL HIGH (ref 0.61–1.24)
GFR calc Af Amer: 8 mL/min — ABNORMAL LOW (ref >60)
GFR, EST NON AFRICAN AMERICAN: 7 mL/min — AB (ref >60)
Glucose, Bld: 138 mg/dL — ABNORMAL HIGH (ref 65–99)
POTASSIUM: 4.7 mmol/L (ref 3.5–5.1)
SODIUM: 135 mmol/L (ref 135–145)

## 2017-07-23 MED ORDER — NICOTINE 21 MG/24HR TD PT24
21.0000 mg | MEDICATED_PATCH | Freq: Every day | TRANSDERMAL | Status: DC
  Administered 2017-07-23 – 2017-08-05 (×14): 21 mg via TRANSDERMAL
  Filled 2017-07-23 (×14): qty 1

## 2017-07-23 MED ORDER — LIDOCAINE HCL (PF) 2 % IJ SOLN
0.0000 mL | Freq: Once | INTRAMUSCULAR | Status: DC | PRN
  Filled 2017-07-23: qty 20

## 2017-07-23 MED ORDER — HEPARIN (PORCINE) IN NACL 100-0.45 UNIT/ML-% IJ SOLN
2150.0000 [IU]/h | INTRAMUSCULAR | Status: DC
  Administered 2017-07-23: 950 [IU]/h via INTRAVENOUS
  Administered 2017-07-25: 1950 [IU]/h via INTRAVENOUS
  Administered 2017-07-25: 2150 [IU]/h via INTRAVENOUS
  Filled 2017-07-23 (×6): qty 250

## 2017-07-23 MED ORDER — WARFARIN SODIUM 7.5 MG PO TABS: 7.5000 mg | ORAL_TABLET | Freq: Once | ORAL | Status: DC

## 2017-07-23 MED ORDER — VITAMIN K1 10 MG/ML IJ SOLN
5.0000 mg | Freq: Once | INTRAVENOUS | Status: AC
  Administered 2017-07-23: 5 mg via INTRAVENOUS
  Filled 2017-07-23: qty 0.5

## 2017-07-23 MED ORDER — LACTULOSE 10 GM/15ML PO SOLN
20.0000 g | Freq: Three times a day (TID) | ORAL | Status: DC
  Administered 2017-07-23: 20 g via ORAL
  Filled 2017-07-23 (×3): qty 30

## 2017-07-23 MED ORDER — DEXTROSE 50 % IV SOLN
INTRAVENOUS | Status: AC
  Administered 2017-07-23: 50 mL
  Filled 2017-07-23: qty 50

## 2017-07-23 NOTE — Progress Notes (Signed)
PROGRESS NOTE  Hayden [name]  FTD:[id number] DOB: 1971/03/23 DOA: 07/19/2017 PCP: [name], DO   Brief Narrative: Hayden [name] is a 47 y.o. 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 XR showed calcified vessels without evidence of emphysema or fracture. Vancomycin and zosyn were given, nephrology consulted for HD while inpatient, and he was admitted. Fistulogram performed 1/22 showed widely patent AVF circuit. The patient grew more lethargic following the procedure. Neurology was consulted, sedating medications held, MRI ordered and pending. EEG shows nonspecific slowing.   Assessment & Plan: Cellulitis and abscess of left lower extremity: He has risk factors including h/o DM, PVD w/hx right AKA. U/S negative for DVT.  - Erythema improving. No abscess. Splotchy hyperpigmented areas increasing. Continue vanc/zosyn for now.  - Discussed with Dr. 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 Phosphate binders per nephrology.  - Will also stop coumadin, give vit K and heparin as below. - Monitor cultures (no growth x3 days).   Acute metabolic encephalopathy: With waxing/waning course, suspect delirium possibly worsened by sedating medications. EEG 1/23 nonspecific slowing without epileptiform discharges. No seizure-like activity reported. Not hypercarbic. ?behavioral component.  - Ammonia mildly elevated, though valpro level undetectable. Will empirically start lactulose if pt able to take po.  - Hold sedating medications - Appreciate neurology involvement.  - Awaiting MRI (PPM is MRI conditional, safe for MRI brain.  It is [employer] MRI model [id number].) - Lethargy remains despite correction of hypoglycemia and treatment of infection. - Doubt infection could be causing this with negative cultures, afebrile, no leukocytosis. Would check cultures, CXR, UA if develops fever, cough, etc.   ESRD on hemodialysis with mild hyperkalemia and mild anemia - Nephrology providing HD on schedule.  - Continuing lasix.  Subtherapeutic INR: He is on coumadin for his vascular disease and Aflutter with a high Chads-vasc score.  He has been out of his coumadin and INR was 1.2.  - Holding coumadin as above.  History of CVA: Minimal residual deficits. CT head 1/21 without acute abnormality.  - Continue statin, coumadin - MRI as above    Above knee amputation of right lower extremity  - PT evaluation    Complete heart block: s/p PPM placement Nov 2018.  - No signs pacemaker is malfunctioning.      Atrial flutter: CHA2DS2-VASc Scoreis 5 - HR is well controlled, continue coreg.   - Heparin per pharmacy, stop coumadin    Hyperlipidemia: - Continue statin.   Bipolar/depression:  - Continue depakote and lexapro  DVT prophylaxis: Coumadin now held. INR 2.04.  Code Status: Full Family Communication: Wife at the bedside Disposition Plan: Uncertain  Consultants:   Nephrology  IR  Neurology  General surgery  Procedures:   HD 1/21, 1/23  EEG 1/23:  Description: The patient is poorly responsive during the recording.  During maximal wakefulness, there is a symmetric 7 Hz posterior dominant rhythm that attenuates with eye opening. This is admixed with diffuse 4-5 Hz theta and 2-3 Hz delta slowing of the waking background.  During drowsiness, there is an increase in theta slowing of the background.  Stage 2 sleep is not seen.  During the study, he exhibited occasional brief jerk of the extremities with no electrographic correlate.  There were no epileptiform discharges or electrographic seizures  seen.    EKG lead was unremarkable.  Impression: This EEG is abnormal due to diffuse slowing of the waking background.  Clinical Correlation of the above findings indicates diffuse cerebral dysfunction that is non-specific in etiology and can be seen with hypoxic/ischemic injury, toxic/metabolic encephalopathies, neurodegenerative disorders, or medication effect.  The absence of epileptiform discharges does not rule out a clinical diagnosis of epilepsy.  Clinical correlation is advised.  Antimicrobials:  Vancomycin, Zosyn   Subjective: Lethargy improved today, still not at baseline. Denies pain when asked. Was unable to take pills today.  Objective: Vitals:   07/22/17 1300 07/22/17 1622 07/22/17 1933 07/23/17 0432  BP: 102/68 118/66 135/82 131/63  Pulse: 79 76 76 76  Resp: 20 14 16 16   Temp: 97.6 F (36.4 C)  98.6 F (37 C) 98.4 F (36.9 C)  TempSrc: Axillary  Axillary Axillary  SpO2: 99% 97% 95% 97%  Weight:      Height:        Intake/Output Summary (Last 24 hours) at 07/23/2017 1310 Last data filed at 07/22/2017 1900 Gross per 24 hour  Intake 300 ml  Output 1 ml  Net 299 ml   Filed Weights   07/20/17 0348 07/21/17 0830 07/21/17 1252  Weight: 105 kg (231 lb 7.7 oz) 106.8 kg (235 lb 7.2 oz) 101.4 kg (223 lb 8.7 oz)   Gen: Chronically ill-appearing 47 y.o. male more responsive today. Pulm: Non-labored breathing. Clear to auscultation bilaterally.  CV: Regular rate and rhythm. No murmur, rub, or gallop. No JVD. GI: Abdomen soft, nondistended, +BS.   Ext: RLE amputation site stable, normal appearing. Left shin and ankle with sharply demarcated hyperpigmented/purpuric lesions slowly enlarging with surrounding blanchable erythema and induration without palpable abscess. No purulence noted. He withdraws leg to pain.  Skin: As above Neuro: Drowsy but rousable, asks me how I'm doing, but otherwise mumbles and doesn't follow commands. No seizure-like activity noted.  Psych:  UTD  CBC: Recent Labs  Lab 07/19/17 2018 07/20/17 0929 07/21/17 0900 07/22/17 0654 07/22/17 0734 07/23/17 0539  WBC 8.2 9.7 11.5* 9.9 9.0 8.4  NEUTROABS 5.4  --   --   --   --   --   HGB 11.2* 10.1* 10.2* 10.8* 10.5* 10.9*  HCT 35.8* 33.6* 32.9* 34.2* 34.0* 35.7*  MCV 88.4 89.1 88.9 87.5 87.9 89.0  PLT 462* 466* 499* 495* 475* 053*   Basic Metabolic Panel: Recent Labs  Lab 07/20/17 0929 07/21/17 0900 07/22/17 0654 07/22/17 0734 07/23/17 0539  NA 138 138 136 137 135  K 5.9* 6.7* 4.6 4.8 4.7  CL 97* 98* 93* 94* 94*  CO2 22 21* 22 24 20*  GLUCOSE 97 91 76 74 138*  BUN 69* 86* 50* 50* 68*  CREATININE 8.67* 9.93* 7.35* 7.35* 8.64*  CALCIUM 8.9 8.7* 9.5 9.5 8.6*  PHOS  --  12.8*  --  10.2*  --    Liver Function Tests: Recent Labs  Lab 07/19/17 2018 07/21/17 0900 07/22/17 0734  AST 14*  --   --   ALT 13*  --   --   ALKPHOS 173*  --   --   BILITOT 1.1  --   --   PROT 7.0  --   --   ALBUMIN 3.0* 2.9* 2.9*   Radiology Studies: Ct Head Wo Contrast  Result Date: 07/21/2017 CLINICAL  DATA:  Altered level of consciousness and confusion. EXAM: CT HEAD WITHOUT CONTRAST TECHNIQUE: Contiguous axial images were obtained from the base of the skull through the vertex without intravenous contrast. COMPARISON:  05/16/2017 FINDINGS: Brain: Stable small lacunar infarct of right basal ganglia. Old lacunar infarcts also suspected in the left cerebellar hemisphere and left thalamus. The brain demonstrates no evidence of hemorrhage, acute infarction, edema, mass effect, extra-axial fluid collection, hydrocephalus or mass lesion. Vascular: Stable calcifications involving the intracranial internal carotid arteries, basilar artery and vertebral arteries. No hyperdense thrombus identified. Skull: Normal. Negative for fracture or focal lesion. Sinuses/Orbits: No acute finding. Other: None. IMPRESSION: No acute findings.  Old lacunar infarcts present. Electronically Signed   By: Aletta Edouard M.D.    On: 07/21/2017 18:11   Ir Dialy Shunt Intro Needle/intracath Initial W/img Left  Result Date: 07/22/2017 INDICATION: Prolonged bleeding after dialysis. EXAM: LEFT WRIST AV FISTULOGRAM MEDICATIONS: None.  75 cc Isovue 300. ANESTHESIA/SEDATION: None FLUOROSCOPY TIME:  Fluoroscopy Time:  minutes 12 seconds (102 mGy). COMPLICATIONS: None immediate. PROCEDURE: Informed written consent was obtained from the patient after a thorough discussion of the procedural risks, benefits and alternatives. All questions were addressed. Maximal Sterile Barrier Technique was utilized including caps, mask, sterile gowns, sterile gloves, sterile drape, hand hygiene and skin antiseptic. A timeout was performed prior to the initiation of the procedure. The left wrist was prepped and draped in a sterile fashion. An 18 gauge Angiocath was inserted into the outflow forearm vein. Contrast was injected for imaging. The Angiocath was removed and hemostasis was achieved with direct pressure. FINDINGS: The wrist arteriovenous anastomosis, outflow forearm veins, upper arm veins, and central venous structures are all widely patent. There are multiple competing venous structures in the forearm. IMPRESSION: Left wrist AV fistula circuit is widely patent. ACCESS: This access remains amenable to future percutaneous interventions as clinically indicated. Electronically Signed   By: Marybelle Killings M.D.   On: 07/22/2017 12:14    Scheduled Meds: . amLODipine  10 mg Oral Daily  . carvedilol  25 mg Oral BID WC  . cinacalcet  60 mg Oral Q breakfast  . cloNIDine  0.2 mg Transdermal Q Tue  . divalproex  500 mg Oral Q12H  . escitalopram  10 mg Oral Daily  . ferric citrate  420 mg Oral TID WC  . furosemide  120 mg Oral BID  . gabapentin  100 mg Oral BID  . hydrALAZINE  100 mg Oral Q8H  . irbesartan  300 mg Oral Daily  . isosorbide dinitrate  10 mg Oral BID  . lactulose  20 g Oral TID  . multivitamin  1 tablet Oral QHS  . pravastatin  40 mg Oral  Daily  . warfarin  7.5 mg Oral ONCE-1800  . Warfarin - Pharmacist Dosing Inpatient   Does not apply q1800   Continuous Infusions: . sodium chloride    . sodium chloride    . piperacillin-tazobactam (ZOSYN)  IV 3.375 g (07/23/17 1023)  . vancomycin 1,000 mg (07/23/17 1257)     LOS: 3 days   Time spent: 25 minutes.  Vance Gather, MD [name] Pager [phone]  If 7PM-7AM, please contact night-coverage [email or web address] Password [email or web address] 07/23/2017, 1:10 PM
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
## 2017-07-23 NOTE — Progress Notes (Addendum)
Physical Therapy Treatment Patient Details Name: Hayden [name] MRN: [id number] DOB: Sep 24, 1970 Today's Date: 07/23/2017    History of Present Illness Hayden [name] is a 47 y.o. 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     PT Comments    Pt progressing towards physical therapy goals, and it appears that cognition continues to be the main limiting factor. Pt remains lethargic but was able to maintain eyes open ~10 minutes during EOB activity. Pt attempting to follow commands, however noted tremor-like movement in RUE when attempting to raise it to wave to wife, take a washcloth from therapist and wash his face. Mod assist provided for RUE support for these tasks. Recommending rehab admissions coordinator follow along to assess appropriateness for CIR admission when cognition clears. Will continue to follow acutely.   Follow Up Recommendations  CIR     Equipment Recommendations  None recommended by PT    Recommendations for Other Services       Precautions / Restrictions Precautions Precautions: Fall Precaution Comments: fell off EOB yesterday onto his head per son's report during evaluation. Restrictions Weight Bearing Restrictions: No    Mobility  Bed Mobility Overal bed mobility: Needs Assistance Bed Mobility: Supine to Sit;Sit to Supine Rolling: Max assist   Supine to sit: Max assist;+2 for physical assistance Sit to supine: Max assist;+2 for physical assistance   General bed mobility comments: Max A for R and L roll for pad to be placed under pt. Max A +2 for hips to EOB and elevation of trunk. Pt opened eyes upon transition to EOB and maintained eyes open ~10  minutes for EOB activity.   Transfers                 General transfer comment: unable due to level of arousal  Ambulation/Gait             General Gait Details: has not been ambulating outside of parallel bars - just recently got a prosthesis.   Stairs            Wheelchair Mobility    Modified Rankin (Stroke Patients Only)       Balance Overall balance assessment: Needs assistance Sitting-balance support: Bilateral upper extremity supported Sitting balance-Leahy Scale: Poor Sitting balance - Comments: min-max A to maintain sitting Postural control: Posterior lean                                  Cognition Arousal/Alertness: Lethargic Behavior During Therapy: Flat affect Overall Cognitive Status: Impaired/Different from baseline Area of Impairment: Following commands                       Following Commands: Follows one step commands inconsistently;Follows one step commands with increased time       General Comments: pt very lethargic throughout session so cognition not able to be adequately assessed. Pt was able to squeeze hand, and made attempts to take a washcloth from therapist to wash his face. Appeared to attempt to verbalize x2 during session however came out as grunts.       Exercises  General Comments        Pertinent Vitals/Pain Pain Assessment: Faces Faces Pain Scale: Hurts a little bit Pain Location: Generalized moaning and grimacing with movement - especially turns    Home Living                      Prior Function            PT Goals (current goals can now be found in the care plan section) Acute Rehab PT Goals Patient Stated Goal: son wants to take pt home to begin therapy for prosthesis PT Goal Formulation: With family Time For Goal Achievement: 08/05/17 Potential to Achieve Goals: Good Progress towards PT goals: Progressing toward goals    Frequency    Min 3X/week       PT Plan Discharge plan needs to be updated    Co-evaluation              AM-PAC PT "6 Clicks" Daily Activity  Outcome Measure  Difficulty turning over in bed (including adjusting bedclothes, sheets and blankets)?: Unable Difficulty moving from lying on back to sitting on the side of the bed? : Unable Difficulty sitting down on and standing up from a chair with arms (e.g., wheelchair, bedside commode, etc,.)?: Unable Help needed moving to and from a bed to chair (including a wheelchair)?: Total Help needed walking in hospital room?: Total Help needed climbing 3-5 steps with a railing? : Total 6 Click Score: 6    End of Session   Activity Tolerance: Patient limited by lethargy Patient left: in bed;with call bell/phone within reach;with bed alarm set;with family/visitor present Nurse Communication: Mobility status;Other (comment)(cognition) PT Visit Diagnosis: Other abnormalities of gait and mobility (R26.89)     Time: 2458-0998 PT Time Calculation (min) (ACUTE ONLY): 21 min  Charges:  $Therapeutic Activity: 8-22 mins                    G Codes:       Rolinda Roan, PT, DPT Acute Rehabilitation Services Pager: [phone]    Thelma Comp 07/23/2017, 2:08 PM

## 2017-07-23 NOTE — Progress Notes (Signed)
Rehab Admissions Coordinator Note:  Patient was screened by Retta Diones for appropriateness for an Inpatient Acute Rehab Consult.  At this time, we are recommending Inpatient Rehab consult.  Retta Diones 07/23/2017, 2:17 PM  I can be reached at [phone].

## 2017-07-23 NOTE — Progress Notes (Signed)
ANTICOAGULATION CONSULT NOTE - Follow Up Consult  Pharmacy Consult:  Heparin IV  Indication:  Aflutter, warfarin discontinued due to concern for skin necrosis  Allergies  Allergen Reactions  . Dilaudid [Hydromorphone Hcl] Other (See Comments)    ABNORMAL BEHAVIOR, "VERBALLY AND PHYSICALLY ABUSIVE," PSYCHOSIS  . Morphine And Related Itching  . Adhesive [Tape] Other (See Comments)    Redness from adhesive tape if left on too long, paper tape is preferred    Patient Measurements: Height: 5\' 10"  (177.8 cm) Weight: 223 lb 8.7 oz (101.4 kg) IBW/kg (Calculated) : 73  Heparin dosing weight: 94.3 kg  Vital Signs: Temp: 98.4 F (36.9 C) (01/23 0432) Temp Source: Axillary (01/23 0432) BP: 131/63 (01/23 0432) Pulse Rate: 76 (01/23 0432)  Labs: Recent Labs    07/20/17 2125  07/21/17 0900 07/22/17 0654 07/22/17 0734 07/23/17 0539  HGB  --    < > 10.2* 10.8* 10.5* 10.9*  HCT  --    < > 32.9* 34.2* 34.0* 35.7*  PLT  --    < > 499* 495* 475* 483*  LABPROT  --   --  16.5* 18.8*  --  22.9*  INR  --   --  1.35 1.59  --  2.04  HEPARINUNFRC <0.10*  --   --   --   --   --   CREATININE  --    < > 9.93* 7.35* 7.35* 8.64*   < > = values in this interval not displayed.    Estimated Creatinine Clearance: 12.9 mL/min (A) (by C-G formula based on SCr of 8.64 mg/dL (H)).    Assessment: 47 YO male who was on Coumadin PTA for history of AFib.  INR increased to therapeutic level today.  Yesterday's dose was not given, yet INR still increased to goal. .  No bleeding reported.  U/S negative for DVT.  Dr. Bonner Puna noted that splotchy hyperpigmented areas increasing. Concerned for skin necrosis thus stopped Coumadin and ordered to give Vitamin K 5mg  IVPB to reverse coumadin. Also consulted  pharmacy to start IV heparin infusion.  INR was 2.04 this AM, now to get Vitamin K to reverse INR.     Goal of Therapy:  Heparin level 0.3-0.7 units/ml Monitor platelets by anticoagulation protocol: Yes      Plan:  Start IV heparin drip (no bolus) 950 units/hr  6 hour Heparin level Daily heparin level, CBC.   INR in AM  Nicole Cella, RPh Clinical Pharmacist Pager: [phone] 4P-10P [phone] [place] [phone] 07/23/2017, 4:09 PM

## 2017-07-23 NOTE — Progress Notes (Signed)
[facility] Progress Note  Subjective: remains lethargic but awake and talking today, got narcan last night  Vitals:   07/22/17 1300 07/22/17 1622 07/22/17 1933 07/23/17 0432  BP: 102/68 118/66 135/82 131/63  Pulse: 79 76 76 76  Resp: 20 14 16 16   Temp: 97.6 F (36.4 C)  98.6 F (37 C) 98.4 F (36.9 C)  TempSrc: Axillary  Axillary Axillary  SpO2: 99% 97% 95% 97%  Weight:      Height:        Inpatient medications: . amLODipine  10 mg Oral Daily  . carvedilol  25 mg Oral BID WC  . cinacalcet  60 mg Oral Q breakfast  . cloNIDine  0.2 mg Transdermal Q Tue  . divalproex  500 mg Oral Q12H  . escitalopram  10 mg Oral Daily  . ferric citrate  420 mg Oral TID WC  . furosemide  120 mg Oral BID  . gabapentin  100 mg Oral BID  . hydrALAZINE  100 mg Oral Q8H  . irbesartan  300 mg Oral Daily  . isosorbide dinitrate  10 mg Oral BID  . lactulose  20 g Oral TID  . multivitamin  1 tablet Oral QHS  . pravastatin  40 mg Oral Daily  . warfarin  7.5 mg Oral ONCE-1800  . Warfarin - Pharmacist Dosing Inpatient   Does not apply q1800   . sodium chloride    . sodium chloride    . piperacillin-tazobactam (ZOSYN)  IV 3.375 g (07/23/17 1023)  . vancomycin Stopped (07/23/17 1459)   sodium chloride, sodium chloride, alteplase, heparin, lidocaine (PF), lidocaine-prilocaine, nitroGLYCERIN, pentafluoroprop-tetrafluoroeth, senna-docusate  Exam: Still lethargic, not responding No jvd Chest cta bilat RRR 3/6 holosyst M abd obese soft ntnd R AKA w/o edema L lower leg extensive serpentine purplish discoloration w several small areas of eschar, tender to palpation, no SQ nodules L AVF+bruit Confused   Dialysis: MWF NW 4h  450/800  98kg   2/2.25 bath  L AVF Hep 2400 - Hectoral 57mcg IV q HD - Venofer 100mg  x 10 ordered (4 given) - Mircera 291mcg IV q 2 weeks (last 1/18) -home BP Rx  norvasc 10, coreg 25 bid, clon patch 0.2 weekly, lasix 120 bid, hydral 100 tid, avapro 300        Impression: 1 Cellulitis , +leg eschar 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 or coumadin skin necrosis 2 ESRD HD MWF 3 AMS hx mania  4 Bipolar 5 HTN vol excess, up 3kg, mult meds, bp ok, get vol down 6 Anemia 7 DM2 8 PVD 9 Aflutter  Plan - HD MWF, hold Ca / vit D , consult surg for deep tissue skin bx, hold coumadin for now   Kelly Splinter MD [facility] pager [phone]   07/23/2017, 3:03 PM   Recent Labs  Lab 07/21/17 0900 07/22/17 0654 07/22/17 0734 07/23/17 0539  NA 138 136 137 135  K 6.7* 4.6 4.8 4.7  CL 98* 93* 94* 94*  CO2 21* 22 24 20*  GLUCOSE 91 76 74 138*  BUN 86* 50* 50* 68*  CREATININE 9.93* 7.35* 7.35* 8.64*  CALCIUM 8.7* 9.5 9.5 8.6*  PHOS 12.8*  --  10.2*  --    Recent Labs  Lab 07/19/17 2018 07/21/17 0900 07/22/17 0734  AST 14*  --   --   ALT 13*  --   --   ALKPHOS 173*  --   --   BILITOT 1.1  --   --   PROT  7.0  --   --   ALBUMIN 3.0* 2.9* 2.9*   Recent Labs  Lab 07/19/17 2018  07/22/17 0654 07/22/17 0734 07/23/17 0539  WBC 8.2   < > 9.9 9.0 8.4  NEUTROABS 5.4  --   --   --   --   HGB 11.2*   < > 10.8* 10.5* 10.9*  HCT 35.8*   < > 34.2* 34.0* 35.7*  MCV 88.4   < > 87.5 87.9 89.0  PLT 462*   < > 495* 475* 483*   < > = values in this interval not displayed.   Iron/TIBC/Ferritin/ %Sat    Component Value Date/Time   IRON 18 (L) 12/05/2016 2039   TIBC 185 (L) 12/05/2016 2039   FERRITIN 1,444 (H) 11/15/2015 1342   IRONPCTSAT 10 (L) 12/05/2016 2039

## 2017-07-23 NOTE — Progress Notes (Signed)
EEG completed, results pending. 

## 2017-07-23 NOTE — Progress Notes (Signed)
Neurology Progress Note   S:// Seen and examined. Improved some but continues to be lethargic and speech is dysarthric.  O:// Current vital signs: BP 131/63 (BP Location: Right Arm)   Pulse 76   Temp 98.4 F (36.9 C) (Axillary)   Resp 16   Ht 5\' 10"  (1.778 m)   Wt 101.4 kg (223 lb 8.7 oz)   SpO2 97%   BMI 32.08 kg/m  Vital signs in last 24 hours: Temp:  [98.4 F (36.9 C)-98.6 F (37 C)] 98.4 F (36.9 C) (01/23 0432) Pulse Rate:  [76] 76 (01/23 0432) Resp:  [14-16] 16 (01/23 0432) BP: (118-135)/(63-82) 131/63 (01/23 0432) SpO2:  [95 %-97 %] 97 % (01/23 0432) General: Patient is awake, in no distress HEENT: Normocephalic, atraumatic Lungs are clear to auscultation Cardio vascular: S1-S2 heard, regular rate rhythm Abdomen: Soft nondistended nontender Extremities: Right AKA, cellulitis on the left leg with hyperpigmentation Neurological exam Patient awake, alert, attends to the examiner.  He was able to tell me his name.  Unable to tell me where he has.  Very poor attention concentration. Speech is dysarthric. Cranial nerves: Pupils equal round reactive to light, no obvious clear breath sounds, no obvious facial droop Motor exam: Does not follow commands but moves all 4 extremities normally. Sensory exam: Withdraws all 4 to noxious stim Gait and coordination cannot be tested.  Medications  Current Facility-Administered Medications:  .  0.9 %  sodium chloride infusion, 100 mL, Intravenous, PRN, Loren Racer, PA-C .  0.9 %  sodium chloride infusion, 100 mL, Intravenous, PRN, Stovall, Woodfin Ganja, PA-C .  alteplase (CATHFLO ACTIVASE) injection 2 mg, 2 mg, Intracatheter, Once PRN, Loren Racer, PA-C .  amLODipine (NORVASC) tablet 10 mg, 10 mg, Oral, Daily, Gilles Chiquito B, MD, 10 mg at 07/22/17 1002 .  carvedilol (COREG) tablet 25 mg, 25 mg, Oral, BID WC, Gilles Chiquito B, MD, 25 mg at 07/22/17 1002 .  cinacalcet (SENSIPAR) tablet 60 mg, 60 mg, Oral, Q breakfast,  Gilles Chiquito B, MD, 60 mg at 07/22/17 1001 .  cloNIDine (CATAPRES - Dosed in mg/24 hr) patch 0.2 mg, 0.2 mg, Transdermal, Q Denyse Dago B, MD, 0.2 mg at 07/22/17 1007 .  divalproex (DEPAKOTE SPRINKLE) capsule 500 mg, 500 mg, Oral, Q12H, Gilles Chiquito B, MD, 500 mg at 07/22/17 1005 .  escitalopram (LEXAPRO) tablet 10 mg, 10 mg, Oral, Daily, Gilles Chiquito B, MD, 10 mg at 07/22/17 1023 .  ferric citrate (AURYXIA) tablet 420 mg, 420 mg, Oral, TID WC, Gilles Chiquito B, MD, 420 mg at 07/22/17 1005 .  furosemide (LASIX) tablet 120 mg, 120 mg, Oral, BID, Gilles Chiquito B, MD, 120 mg at 07/22/17 1003 .  gabapentin (NEURONTIN) capsule 100 mg, 100 mg, Oral, BID, Gilles Chiquito B, MD, 100 mg at 07/22/17 1004 .  heparin injection 1,000 Units, 1,000 Units, Dialysis, PRN, Loren Racer, PA-C .  hydrALAZINE (APRESOLINE) tablet 100 mg, 100 mg, Oral, Q8H, Gilles Chiquito B, MD, 100 mg at 07/22/17 0500 .  irbesartan (AVAPRO) tablet 300 mg, 300 mg, Oral, Daily, Gilles Chiquito B, MD, 300 mg at 07/22/17 1004 .  isosorbide dinitrate (ISORDIL) tablet 10 mg, 10 mg, Oral, BID, Gilles Chiquito B, MD, 10 mg at 07/22/17 1006 .  lactulose (CHRONULAC) 10 GM/15ML solution 20 g, 20 g, Oral, TID, Bonner Puna, Ryan B, MD .  lidocaine (PF) (XYLOCAINE) 1 % injection 5 mL, 5 mL, Intradermal, PRN, Stovall, Kathryn R, PA-C .  lidocaine (XYLOCAINE) 2 % injection 0-20  mL, 0-20 mL, Intradermal, Once PRN, Earnstine Regal, PA-C .  lidocaine-prilocaine (EMLA) cream 1 application, 1 application, Topical, PRN, Loren Racer, PA-C .  multivitamin (RENA-VIT) tablet 1 tablet, 1 tablet, Oral, QHS, Gilles Chiquito B, MD, 1 tablet at 07/21/17 2126 .  nitroGLYCERIN (NITROSTAT) SL tablet 0.4 mg, 0.4 mg, Sublingual, Q5 min PRN, Gilles Chiquito B, MD .  pentafluoroprop-tetrafluoroeth (GEBAUERS) aerosol 1 application, 1 application, Topical, PRN, Loren Racer, PA-C .  piperacillin-tazobactam (ZOSYN) IVPB 3.375 g, 3.375 g, Intravenous, Q12H, Skeet Simmer, [facility], Stopped at 07/23/17 1547 .  pravastatin (PRAVACHOL) tablet 40 mg, 40 mg, Oral, Daily, Gilles Chiquito B, MD, 40 mg at 07/22/17 1002 .  senna-docusate (Senokot-S) tablet 1 tablet, 1 tablet, Oral, QHS PRN, Sid Falcon, MD .  vancomycin (VANCOCIN) IVPB 1000 mg/200 mL premix, 1,000 mg, Intravenous, Q M,W,F-HD, Erenest Blank, RPH, Stopped at 07/23/17 1459 .  Warfarin - Pharmacist Dosing Inpatient, , Does not apply, q1800, Erenest Blank [facility] Labs CBC    Component Value Date/Time   WBC 8.4 07/23/2017 0539   RBC 4.01 (L) 07/23/2017 0539   HGB 10.9 (L) 07/23/2017 0539   HCT 35.7 (L) 07/23/2017 0539   PLT 483 (H) 07/23/2017 0539   MCV 89.0 07/23/2017 0539   MCH 27.2 07/23/2017 0539   MCHC 30.5 07/23/2017 0539   RDW 19.2 (H) 07/23/2017 0539   LYMPHSABS 1.2 07/19/2017 2018   MONOABS 0.9 07/19/2017 2018   EOSABS 0.4 07/19/2017 2018   BASOSABS 0.2 (H) 07/19/2017 2018    CMP     Component Value Date/Time   NA 135 07/23/2017 0539   K 4.7 07/23/2017 0539   CL 94 (L) 07/23/2017 0539   CO2 20 (L) 07/23/2017 0539   GLUCOSE 138 (H) 07/23/2017 0539   BUN 68 (H) 07/23/2017 0539   CREATININE 8.64 (H) 07/23/2017 0539   CALCIUM 8.6 (L) 07/23/2017 0539   PROT 7.0 07/19/2017 2018   ALBUMIN 2.9 (L) 07/22/2017 0734   AST 14 (L) 07/19/2017 2018   ALT 13 (L) 07/19/2017 2018   ALKPHOS 173 (H) 07/19/2017 2018   BILITOT 1.1 07/19/2017 2018   GFRNONAA 7 (L) 07/23/2017 0539   GFRAA 8 (L) 07/23/2017 0539  Valproate level is undetectable. Mildly elevated ammonia at 41. Imaging I have reviewed images in epic and the results pertinent to this consultation are: No new imaging to review  EEG- generalized slowing only.  No focal slowing.  No electro graphic seizures  Assessment:  47 year old man with history of atrial flutter on Coumadin, prior stroke with unknown deficits, history of seizures not well prior with undetectable levels, cognitive deficits after stroke, peripheral vascular  disease, anxiety, bipolar disorder, chronic back pain on opiates and Neurontin, end-stage renal disease dialysis Monday Wednesday Friday, with left leg cellulitis with acute onset of altered mental status in the hospital and neurology was consulted for that. He continues to be encephalopathic-although much more awake than yesterday. I suspect this is toxic metabolic encephalopathy. His current level is undetectable.  I checked with the pharmacy to see if there is any reason why that should be in terms of any medicine that are interacting.  There is no obvious reason for levels to be low other than noncompliance.  Impression: Multifactorial toxic metabolic encephalopathy Evaluate for stroke Evaluate for seizure  Recommendations: Continue to minimize sedating medication Correction of toxic metabolic derangements per primary team as you are Obtain MRI of the brain when possible I would recommend changing well  way to IV if he is unable to take p.o. Medications. Recheck ammonia levels We will reexamine him after dialysis tomorrow,  -- Amie Portland, MD [facility] Pager: [phone] If 7pm to 7am, please call on call as listed on AMION.

## 2017-07-23 NOTE — NC FL2 (Signed)
[place] LEVEL OF CARE SCREENING TOOL     IDENTIFICATION  Patient Name: Hayden [name] Birthdate: 1971-04-11 Sex: male Admission Date (Current Location): 07/19/2017  [facility] and [place] Number:  ([place])   Facility and Address:  [facility] [place], [place] [address] [place], [place] [address]      Provider Number: [id number]  Attending Physician Name and Address:  Patrecia Pour, MD  Relative Name and Phone Number:  Anvay Tennis, spouse, [phone]    Current Level of Care: Hospital Recommended Level of Care: [place] Prior Approval Number:    Date Approved/Denied:   PASRR Number:    Discharge Plan: SNF    Current Diagnoses: Patient Active Problem List   Diagnosis Date Noted  . Cellulitis and abscess of left lower extremity 07/20/2017  . Hyperlipidemia with target low density lipoprotein (LDL) cholesterol less than 70 mg/dL 06/03/2017  . Atrial flutter ([place]); CHA2DS2Vasc -5, on warfarin 05/09/2017  . Complete heart block ([place]) - s/p PPM 05/02/2017  . ESRD on hemodialysis ([place])   . Depression   . Phantom limb pain ([name])   . Hypertensive heart disease with chronic diastolic congestive heart failure ([place])   . Above knee amputation of right lower extremity ([place]) 12/04/2016  . Atherosclerosis of native arteries of the extremities with ulceration ([place]) 12/02/2016  . Neuropathic pain   . Type 2 diabetes mellitus with diabetic peripheral angiopathy and gangrene, without long-term current use of insulin ([place])   . Debility 11/22/2016  . S/P femoral-popliteal bypass surgery   . Tobacco abuse   . History of CVA (cerebrovascular accident)   . QT prolongation   . Gangrene of right foot ([place])   . End stage renal disease on dialysis ([place]) 06/19/2015  . GERD (gastroesophageal reflux disease) 06/19/2015  . Acute coronary syndrome ([place]) 09/22/2012  . History of partial Right nephrectomy for renal mass (2008) 09/22/2012  .  Hypertensive urgency, malignant 09/21/2012    Orientation RESPIRATION BLADDER Height & Weight     Self  O2(Nasal Cannula 2L) Continent Weight: 223 lb 8.7 oz (101.4 kg) Height:  5\' 10"  (177.8 cm)  BEHAVIORAL SYMPTOMS/MOOD NEUROLOGICAL BOWEL NUTRITION STATUS      Continent Diet(See DC Summary)  AMBULATORY STATUS COMMUNICATION OF NEEDS Skin   Extensive Assist Verbally Surgical wounds                       Personal Care Assistance Level of Assistance  Bathing, Dressing, Feeding Bathing Assistance: Limited assistance Feeding assistance: Limited assistance Dressing Assistance: Limited assistance     Functional Limitations Info  Sight, Hearing, Speech Sight Info: Adequate Hearing Info: Adequate Speech Info: Adequate    SPECIAL CARE FACTORS FREQUENCY  PT (By licensed PT), OT (By licensed OT)     PT Frequency: 5x week OT Frequency: 5x week            Contractures      Additional Factors Info  Code Status, Allergies, Psychotropic Code Status Info: Full Allergies Info: DILAUDID HYDROMORPHONE HCL, MORPHINE AND RELATED, ADHESIVE TAPE  Psychotropic Info: Depakote, Lexapro         Current Medications (07/23/2017):  This is the current hospital active medication list Current Facility-Administered Medications  Medication Dose Route Frequency Provider Last Rate Last Dose  . 0.9 %  sodium chloride infusion  100 mL Intravenous PRN Loren Racer, PA-C      . 0.9 %  sodium chloride infusion  100 mL  Intravenous PRN Loren Racer, PA-C      . alteplase (CATHFLO ACTIVASE) injection 2 mg  2 mg Intracatheter Once PRN Loren Racer, PA-C      . amLODipine (NORVASC) tablet 10 mg  10 mg Oral Daily Gilles Chiquito B, MD   10 mg at 07/22/17 1002  . carvedilol (COREG) tablet 25 mg  25 mg Oral BID WC Gilles Chiquito B, MD   25 mg at 07/23/17 1641  . cinacalcet (SENSIPAR) tablet 60 mg  60 mg Oral Q breakfast Gilles Chiquito B, MD   60 mg at 07/22/17 1001  . cloNIDine (CATAPRES -  Dosed in mg/24 hr) patch 0.2 mg  0.2 mg Transdermal Q Denyse Dago B, MD   0.2 mg at 07/22/17 1007  . divalproex (DEPAKOTE SPRINKLE) capsule 500 mg  500 mg Oral Q12H Gilles Chiquito B, MD   500 mg at 07/22/17 1005  . escitalopram (LEXAPRO) tablet 10 mg  10 mg Oral Daily Gilles Chiquito B, MD   10 mg at 07/22/17 1023  . ferric citrate (AURYXIA) tablet 420 mg  420 mg Oral TID WC Gilles Chiquito B, MD   420 mg at 07/22/17 1005  . furosemide (LASIX) tablet 120 mg  120 mg Oral BID Gilles Chiquito B, MD   120 mg at 07/23/17 1640  . gabapentin (NEURONTIN) capsule 100 mg  100 mg Oral BID Gilles Chiquito B, MD   100 mg at 07/22/17 1004  . heparin ADULT infusion 100 units/mL (25000 units/266mL sodium chloride 0.45%)  950 Units/hr Intravenous Continuous Patrecia Pour, MD      . heparin injection 1,000 Units  1,000 Units Dialysis PRN Loren Racer, PA-C      . hydrALAZINE (APRESOLINE) tablet 100 mg  100 mg Oral Q8H Gilles Chiquito B, MD   100 mg at 07/22/17 0500  . irbesartan (AVAPRO) tablet 300 mg  300 mg Oral Daily Gilles Chiquito B, MD   300 mg at 07/22/17 1004  . isosorbide dinitrate (ISORDIL) tablet 10 mg  10 mg Oral BID Gilles Chiquito B, MD   10 mg at 07/22/17 1006  . lactulose (CHRONULAC) 10 GM/15ML solution 20 g  20 g Oral TID Patrecia Pour, MD   20 g at 07/23/17 1641  . lidocaine (PF) (XYLOCAINE) 1 % injection 5 mL  5 mL Intradermal PRN Loren Racer, PA-C      . lidocaine (XYLOCAINE) 2 % injection 0-20 mL  0-20 mL Intradermal Once PRN Earnstine Regal, PA-C      . lidocaine-prilocaine (EMLA) cream 1 application  1 application Topical PRN Loren Racer, PA-C      . multivitamin (RENA-VIT) tablet 1 tablet  1 tablet Oral QHS Gilles Chiquito B, MD   1 tablet at 07/21/17 2126  . nitroGLYCERIN (NITROSTAT) SL tablet 0.4 mg  0.4 mg Sublingual Q5 min PRN Sid Falcon, MD      . pentafluoroprop-tetrafluoroeth Landry Dyke) aerosol 1 application  1 application Topical PRN Loren Racer, PA-C      .  phytonadione (VITAMIN K) 5 mg in dextrose 5 % 50 mL IVPB  5 mg Intravenous Once Patrecia Pour, MD 50 mL/hr at 07/23/17 1641 5 mg at 07/23/17 1641  . piperacillin-tazobactam (ZOSYN) IVPB 3.375 g  3.375 g Intravenous Q12H Skeet Simmer, [facility]   Stopped at 07/23/17 1547  . pravastatin (PRAVACHOL) tablet 40 mg  40 mg Oral Daily Gilles Chiquito B, MD   40 mg at 07/22/17 1002  .  senna-docusate (Senokot-S) tablet 1 tablet  1 tablet Oral QHS PRN Sid Falcon, MD      . vancomycin (VANCOCIN) IVPB 1000 mg/200 mL premix  1,000 mg Intravenous Q M,W,F-HD Erenest Blank, Bartholomew at 07/23/17 1459     Discharge Medications: Please see discharge summary for a list of discharge medications.  Relevant Imaging Results:  Relevant Lab Results:   Additional Information SS#: [id number]  MWF Dialysis [place], [place]

## 2017-07-23 NOTE — Clinical Social Work Note (Signed)
Clinical Social Work Assessment  Patient Details  Name: Hayden [name] MRN: [id number] Date of Birth: 23-Nov-1970  Date of referral:  07/23/17               Reason for consult:  Facility Placement                Permission sought to share information with:  Facility Sport and exercise psychologist Permission granted to share information::  Yes, Verbal Permission Granted  Name::     Hayden [name]  Agency::  SNF  Relationship::  spouse  Contact Information:     Housing/Transportation Living arrangements for the past 2 months:  Single Family Home Source of Information:  Spouse Patient Interpreter Needed:  None Criminal Activity/Legal Involvement Pertinent to Current Situation/Hospitalization:  No - Comment as needed Significant Relationships:  Adult Children, Other Family Members, Spouse Lives with:  Adult Children, Spouse Do you feel safe going back to the place where you live?  No Need for family participation in patient care:  Yes (Comment)  Care giving concerns:  Pt from home with spouse and was independent enough at home, but had help from son and spouse when she was not at work.  Pt dealing with cognitive issues while in hospital and is not stable. CSW met with patient and spouse at bedside to discuss the disposition plan. Spouse indicated that patient was in IP Rehab before and desires to return. CSW validated and wanted to discuss a backup plan. Spouse was apprehensive to join in with the discussion however did confirm that she would like to go to SNF in [place] area or [place]. She declined to send out SNF offers to other counties.  She was very adamant that she wanted IP Rehab. CSW validated and reiterated that this is back up plan.  Pt not able to fully participate in discussion and was moaning and uncomfortable while lying in the bed.  Social Worker assessment / plan:  CSW will send out offers. CSW starting the PASSR process and advised doctor of note on chart to be signed. CSW will  continue to follow for disposition.   Employment status:  Disabled (Comment on whether or not currently receiving Disability) Insurance information:  Medicare PT Recommendations:  Inpatient Rehab Consult, [place] / Referral to community resources:  Acute Rehab, [place]  Patient/Family's Response to care:  Family appreciative of CSW coming to meet however, desires to go to [employer] and does not really want pt to go to SNF.  Patient/Family's Understanding of and Emotional Response to Diagnosis, Current Treatment, and Prognosis: Pt/family understands patients impairment and physical limitations and desires IP Rehab as patient has experienced it before and had positive experience. Spouse hopes patient will be able to return to IP Rehab and is apprehensive about the SNF options. She also would want a private room before even making any decisions so that she and son can be with spouse so that he will do well.  CSW validated and indicated that once we get bed offers and she chooses a place, then I can confirm room type.   CSW will assist with disposition.  Emotional Assessment Appearance:  Appears stated age Attitude/Demeanor/Rapport:  Other(Moaning, uncomfortable) Affect (typically observed):  Agitated, Other(In pain, discomfort) Orientation:  Oriented to Self Alcohol / Substance use:    Psych involvement (Current and /or in the community):  No (Comment)  Discharge Needs  Concerns to be addressed:  Discharge Planning Concerns Readmission within the last 30  days:  No Current discharge risk:  Dependent with Mobility, Physical Impairment Barriers to Discharge:  No Barriers Identified   Hayden Baxter, LCSW 07/23/2017, 4:43 PM

## 2017-07-23 NOTE — Procedures (Signed)
ELECTROENCEPHALOGRAM REPORT  Date of Study: 07/23/2017  Patient's Name: Hayden [name] MRN: [id number] Date of Birth: 12-19-1970  Referring Provider: Amie Portland, MD  Clinical History: 47 y.o. male who has a past medical history of atrial flutter on Coumadin, prior stroke with unknown deficits, history of seizure disorder on valproate, cognitive deficits since the stroke, peripheral vascular disease, anxiety, anemia, bipolar disorder, chronic back pain on opiates and gabapentin, end-stage renal disease on dialysis with cellulitis presents with acute altered mental status.  Medications: Depakote Lexapro Gabapentin Hydralazine Avapro Isordil Norvasc Coreg Sensipar Lasix Pravastatin Warfarin  Vancomycin Zosyn  Technical Summary: A multichannel digital EEG recording measured by the international 10-20 system with electrodes applied with paste and impedances below 5000 ohms performed as portable with EKG monitoring in a poorly responsive patient.  Hyperventilation and photic stimulation were not performed.  The digital EEG was referentially recorded, reformatted, and digitally filtered in a variety of bipolar and referential montages for optimal display.   Description: The patient is poorly responsive during the recording.  During maximal wakefulness, there is a symmetric 7 Hz posterior dominant rhythm that attenuates with eye opening. This is admixed with diffuse 4-5 Hz theta and 2-3 Hz delta slowing of the waking background.  During drowsiness, there is an increase in theta slowing of the background.  Stage 2 sleep is not seen.  During the study, he exhibited occasional brief jerk of the extremities with no electrographic correlate.  There were no epileptiform discharges or electrographic seizures seen.    EKG lead was unremarkable.  Impression: This EEG is abnormal due to diffuse slowing of the waking background.  Clinical Correlation of the above findings indicates diffuse  cerebral dysfunction that is non-specific in etiology and can be seen with hypoxic/ischemic injury, toxic/metabolic encephalopathies, neurodegenerative disorders, or medication effect.  The absence of epileptiform discharges does not rule out a clinical diagnosis of epilepsy.  Clinical correlation is advised.   Metta Clines, DO

## 2017-07-23 NOTE — Progress Notes (Addendum)
ANTICOAGULATION & ANTIBIOTIC CONSULT NOTE - Follow Up Consult  Pharmacy Consult:  Coumadin + Vancomycin/Zosyn Indication:  Afib + Cellulitis  Allergies  Allergen Reactions  . Dilaudid [Hydromorphone Hcl] Other (See Comments)    ABNORMAL BEHAVIOR, "VERBALLY AND PHYSICALLY ABUSIVE," PSYCHOSIS  . Morphine And Related Itching  . Adhesive [Tape] Other (See Comments)    Redness from adhesive tape if left on too long, paper tape is preferred    Patient Measurements: Height: 5\' 10"  (177.8 cm) Weight: 223 lb 8.7 oz (101.4 kg) IBW/kg (Calculated) : 73  Vital Signs: Temp: 98.4 F (36.9 C) (01/23 0432) Temp Source: Axillary (01/23 0432) BP: 131/63 (01/23 0432) Pulse Rate: 76 (01/23 0432)  Labs: Recent Labs    07/20/17 0929 07/20/17 2125 07/21/17 0900 07/22/17 0654 07/22/17 0734 07/23/17 0539  HGB 10.1*  --  10.2* 10.8* 10.5* 10.9*  HCT 33.6*  --  32.9* 34.2* 34.0* 35.7*  PLT 466*  --  499* 495* 475* 483*  LABPROT 15.6*  --  16.5* 18.8*  --  22.9*  INR 1.25  --  1.35 1.59  --  2.04  HEPARINUNFRC <0.10* <0.10*  --   --   --   --   CREATININE 8.67*  --  9.93* 7.35* 7.35* 8.64*    Estimated Creatinine Clearance: 12.9 mL/min (A) (by C-G formula based on SCr of 8.64 mg/dL (H)).    Assessment: Hayden [name] on Coumadin PTA for history of AFib.  INR increased to therapeutic level today.  Yesterday's dose was not given, yet INR still increased to goal.  May need a dose reduction eventually, but will continue current dose today due to missed dose.  No bleeding reported.  Home Coumadin dose:  5mg  alternating with 7.5mg  PO daily   Pharmacy also managing vancomycin and Zosyn for cellulitis.  Patient has ESRD on MWF HD, to go today.  He remains afebrile and his WBC is mildly elevated.  Vanc 1/20 >> Zosyn 1/20 >> 1/20, resume 1/21 >>  1/20 VR = 7 mcg/mL (received unknown dose of vanc at HD on 1/18)  1/19 BCx - NGTD   Goal of Therapy:  INR 2 - 3 Monitor platelets by anticoagulation  protocol: Yes  Vanc pre-HD level:  15-25 mcg/mL    Plan:  Repeat Coumadin 7.5mg  PO today  Daily PT / INR  Continue vanc 1gm IV qHD MWF Continue Zosyn EID 3.375gm IV Q12H Monitor HD schedule/tolerance, clinical progress, vanc trough as indicated F/U abx de-escalation   Aliciana Ricciardi D. Mina Marble, PharmD, BCPS Pager:  [phone] 07/23/2017, 9:15 AM

## 2017-07-24 ENCOUNTER — Inpatient Hospital Stay (HOSPITAL_COMMUNITY): Payer: Medicare Other

## 2017-07-24 ENCOUNTER — Ambulatory Visit (HOSPITAL_COMMUNITY): Payer: Medicare Other

## 2017-07-24 DIAGNOSIS — I6349 Cerebral infarction due to embolism of other cerebral artery: Secondary | ICD-10-CM

## 2017-07-24 DIAGNOSIS — L0390 Cellulitis, unspecified: Secondary | ICD-10-CM

## 2017-07-24 DIAGNOSIS — I639 Cerebral infarction, unspecified: Secondary | ICD-10-CM

## 2017-07-24 DIAGNOSIS — I361 Nonrheumatic tricuspid (valve) insufficiency: Secondary | ICD-10-CM

## 2017-07-24 LAB — ECHOCARDIOGRAM COMPLETE
Height: 70 in
WEIGHTICAEL: 3474.45 [oz_av]

## 2017-07-24 LAB — IRON AND TIBC
Iron: 17 ug/dL — ABNORMAL LOW (ref 45–182)
Saturation Ratios: 9 % — ABNORMAL LOW (ref 17.9–39.5)
TIBC: 197 ug/dL — ABNORMAL LOW (ref 250–450)
UIBC: 180 ug/dL

## 2017-07-24 LAB — CBC
HCT: 36.2 % — ABNORMAL LOW (ref 39.0–52.0)
Hemoglobin: 12.2 g/dL — ABNORMAL LOW (ref 13.0–17.0)
MCH: 28.8 pg (ref 26.0–34.0)
MCHC: 33.7 g/dL (ref 30.0–36.0)
MCV: 85.4 fL (ref 78.0–100.0)
Platelets: 423 10*3/uL — ABNORMAL HIGH (ref 150–400)
RBC: 4.24 MIL/uL (ref 4.22–5.81)
RDW: 18.7 % — AB (ref 11.5–15.5)
WBC: 10.1 10*3/uL (ref 4.0–10.5)

## 2017-07-24 LAB — GLUCOSE, CAPILLARY
GLUCOSE-CAPILLARY: 114 mg/dL — AB (ref 65–99)
Glucose-Capillary: 109 mg/dL — ABNORMAL HIGH (ref 65–99)
Glucose-Capillary: 149 mg/dL — ABNORMAL HIGH (ref 65–99)

## 2017-07-24 LAB — PARATHYROID HORMONE, INTACT (NO CA): PTH: 242 pg/mL — AB (ref 15–65)

## 2017-07-24 LAB — BASIC METABOLIC PANEL
Anion gap: 17 — ABNORMAL HIGH (ref 5–15)
BUN: 27 mg/dL — AB (ref 6–20)
CO2: 25 mmol/L (ref 22–32)
CREATININE: 5.37 mg/dL — AB (ref 0.61–1.24)
Calcium: 8.4 mg/dL — ABNORMAL LOW (ref 8.9–10.3)
Chloride: 94 mmol/L — ABNORMAL LOW (ref 101–111)
GFR, EST AFRICAN AMERICAN: 14 mL/min — AB (ref >60)
GFR, EST NON AFRICAN AMERICAN: 12 mL/min — AB (ref >60)
Glucose, Bld: 131 mg/dL — ABNORMAL HIGH (ref 65–99)
Potassium: 3.7 mmol/L (ref 3.5–5.1)
SODIUM: 136 mmol/L (ref 135–145)

## 2017-07-24 LAB — FERRITIN: FERRITIN: 1427 ng/mL — AB (ref 24–336)

## 2017-07-24 LAB — PROTIME-INR
INR: 1.4
PROTHROMBIN TIME: 17 s — AB (ref 11.4–15.2)

## 2017-07-24 LAB — HEPARIN LEVEL (UNFRACTIONATED): Heparin Unfractionated: <0.1 IU/mL — ABNORMAL LOW (ref 0.30–0.70)

## 2017-07-24 MED ORDER — HEPARIN BOLUS VIA INFUSION
3000.0000 [IU] | Freq: Once | INTRAVENOUS | Status: AC
Start: 2017-07-24 — End: 2017-07-24
  Administered 2017-07-24: 3000 [IU] via INTRAVENOUS
  Filled 2017-07-24: qty 3000

## 2017-07-24 MED ORDER — HEPARIN BOLUS VIA INFUSION
2000.0000 [IU] | Freq: Once | INTRAVENOUS | Status: AC
  Administered 2017-07-24: 2000 [IU] via INTRAVENOUS
  Filled 2017-07-24: qty 2000

## 2017-07-24 MED ORDER — LACTULOSE 10 GM/15ML PO SOLN
10.0000 g | Freq: Two times a day (BID) | ORAL | Status: DC
  Administered 2017-07-25: 10 g via ORAL
  Filled 2017-07-24 (×3): qty 15

## 2017-07-24 NOTE — Progress Notes (Signed)
VASCULAR LAB PRELIMINARY  ARTERIAL  ABI completed:ABIs not ascertained secondary to severe cellulitis.  Previous ABIs done 12/04/16, indicated calcified vessels.  Left toe pressure is abnormal.     RIGHT    LEFT    PRESSURE WAVEFORM  PRESSURE WAVEFORM  BRACHIAL 156 T BRACHIAL    DP BKA  DP    AT BKA  AT  M  PT BKA  PT  M  PER   PER    GREAT TOE  NA GREAT TOE  NA    RIGHT LEFT  ABI  23     Hayden [name], RVT 07/24/2017, 1:00 PM

## 2017-07-24 NOTE — Progress Notes (Signed)
Inpatient Rehabilitation  Attempted to meet with patient; however, he was out of the room.  Met briefly with son to discuss team's recommendation for IP Rehab.  Shared booklets, insurance verification letter, and answered questions.  Patient is known to our service from June 2017 after his AKA and would prefer CIR.  Plan to follow for timing of medical readiness and IP Rehab bed availability.  Discussed with nurse case manager that patient will potentially be ready tomorrow.  Call if questions.  Carmelia Roller., CCC/SLP Admission Coordinator  [place]  Cell [phone]

## 2017-07-24 NOTE — Progress Notes (Signed)
CC:  Lower left leg cellulitis  Subjective: No change  Objective: Vital signs in last 24 hours: Temp:  [97.9 F (36.6 C)-98.5 F (36.9 C)] 98 F (36.7 C) (01/24 0600) Pulse Rate:  [62-91] 88 (01/24 0600) Resp:  [16] 16 (01/24 0600) BP: (104-152)/(59-81) 152/81 (01/24 0600) SpO2:  [93 %-95 %] 95 % (01/24 0600) Weight:  [98.5 kg (217 lb 2.5 oz)-102.4 kg (225 lb 12 oz)] 98.5 kg (217 lb 2.5 oz) (01/24 0600) Last BM Date: 07/22/17 Nothing PO recorded 400 IV 4000 HD Afebrile, VSS Labs pending ABI pending   Intake/Output from previous day: 01/23 0701 - 01/24 0700 In: 400 [I.V.:50; IV Piggyback:350] Out: 4000  Intake/Output this shift: No intake/output data recorded.  General appearance: alert and still confused and dysarthric Skin: Picture below      Lab Results:  Recent Labs    07/22/17 0734 07/23/17 0539  WBC 9.0 8.4  HGB 10.5* 10.9*  HCT 34.0* 35.7*  PLT 475* 483*    BMET Recent Labs    07/22/17 0734 07/23/17 0539  NA 137 135  K 4.8 4.7  CL 94* 94*  CO2 24 20*  GLUCOSE 74 138*  BUN 50* 68*  CREATININE 7.35* 8.64*  CALCIUM 9.5 8.6*   PT/INR Recent Labs    07/22/17 0654 07/23/17 0539  LABPROT 18.8* 22.9*  INR 1.59 2.04    Recent Labs  Lab 07/19/17 2018 07/21/17 0900 07/22/17 0734  AST 14*  --   --   ALT 13*  --   --   ALKPHOS 173*  --   --   BILITOT 1.1  --   --   PROT 7.0  --   --   ALBUMIN 3.0* 2.9* 2.9*     Lipase     Component Value Date/Time   LIPASE 25 02/07/2017 1208     Medications: . amLODipine  10 mg Oral Daily  . carvedilol  25 mg Oral BID WC  . cinacalcet  60 mg Oral Q breakfast  . cloNIDine  0.2 mg Transdermal Q Tue  . divalproex  500 mg Oral Q12H  . escitalopram  10 mg Oral Daily  . ferric citrate  420 mg Oral TID WC  . furosemide  120 mg Oral BID  . gabapentin  100 mg Oral BID  . hydrALAZINE  100 mg Oral Q8H  . irbesartan  300 mg Oral Daily  . isosorbide dinitrate  10 mg Oral BID  . lactulose  20  g Oral TID  . multivitamin  1 tablet Oral QHS  . nicotine  21 mg Transdermal Daily  . pravastatin  40 mg Oral Daily   Anti-infectives (From admission, onward)   Start     Dose/Rate Route Frequency Ordered Stop   07/22/17 0000  cephALEXin (KEFLEX) 500 MG capsule     500 mg Oral Daily 07/22/17 1341     07/21/17 2000  piperacillin-tazobactam (ZOSYN) IVPB 3.375 g     3.375 g 12.5 mL/hr over 240 Minutes Intravenous Every 12 hours 07/21/17 1908     07/21/17 1200  vancomycin (VANCOCIN) IVPB 1000 mg/200 mL premix     1,000 mg 200 mL/hr over 60 Minutes Intravenous Every [name]-F (Hemodialysis) 07/20/17 0130     07/21/17 0933  vancomycin (VANCOCIN) 1-5 GM/200ML-% IVPB    Comments:  Vernell Leep, [place]   : cabinet override      07/21/17 0933 07/21/17 1145   07/20/17 0115  vancomycin (VANCOCIN) IVPB 1000 mg/200 mL premix  1,000 mg 200 mL/hr over 60 Minutes Intravenous  Once 07/20/17 0101 07/20/17 0530   07/19/17 2330  vancomycin (VANCOCIN) 2,000 mg in sodium chloride 0.9 % 500 mL IVPB  Status:  Discontinued     2,000 mg 250 mL/hr over 120 Minutes Intravenous  Once 07/19/17 2329 07/19/17 2343   07/19/17 2330  piperacillin-tazobactam (ZOSYN) IVPB 3.375 g  Status:  Discontinued     3.375 g 12.5 mL/hr over 240 Minutes Intravenous Every 12 hours 07/19/17 2329 07/20/17 1141     . heparin 950 Units/hr (07/23/17 1744)  . piperacillin-tazobactam (ZOSYN)  IV Stopped (07/23/17 2346)  . 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 Peripheral vascular occlusive disease status post right AKA/skin changes left foot   End-stage renal disease on dialysis Monday Wednesday Friday Altered mental status with a history of mania. -Neurology is evaluating History of bipolar disease. 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  We will have to stop heparin also before biopsy.  Collecting equipment needed for skin biopsy.  If renal would mark the area they want biopsied that would be helpful too.      .   LOS: 4 days    Korra Christine 07/24/2017 [phone]

## 2017-07-24 NOTE — Progress Notes (Signed)
VASCULAR LAB PRELIMINARY  PRELIMINARY  PRELIMINARY  PRELIMINARY  Carotid duplex completed.    Preliminary report:  1-39% ICA stenosis.  Vertebral artery flow is antegrade.  Patient has significant plaque throughout the bilateral CCAs and at the bifurcation.  Unable to adequately interrogate secondary to constant patient  Movement.  Hayden [name], RVT 07/24/2017, 12:45 PM

## 2017-07-24 NOTE — Progress Notes (Signed)
PROGRESS NOTE  Hayden [name]  QJJ:[id number] DOB: [birth year] DOA: 07/19/2017 PCP: [name], DO   Brief Narrative: Hayden [name] is a 47 y.o. 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 XR showed calcified vessels without evidence of emphysema or fracture. Vancomycin and zosyn were given, nephrology consulted for HD while inpatient, and he was admitted. Fistulogram performed 1/22 showed widely patent AVF circuit. The patient grew more lethargic following the procedure. Neurology was consulted, sedating medications held. EEG showed nonspecific slowing. MRI showed multiple small supra- and infratentorial diffusion abnormalities most consistent with infarcts spanning multiple vascular territories.   Assessment & Plan: Cellulitis and abscess of left lower extremity: He has risk factors including h/o DM, PVD w/hx right AKA. U/S negative for DVT.  - Erythema is stable. No abscess. Splotchy hyperpigmented areas increasing. Continue vanc/zosyn for now.   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 Pt on heparin with INR <1.5, ok to DC whenever needed for Bx.  - Stopped coumadin, started heparin, holding Ca. Phos binder per nephrology.  - Monitor cultures (no growth x3 days).   Acute metabolic encephalopathy: With waxing/waning course, suspect delirium possibly worsened by sedating medications. EEG 1/23 nonspecific slowing without epileptiform discharges. No seizure-like activity reported. Not hypercarbic. ?behavioral component.  - Ammonia mildly elevated, though valpro level undetectable. Will empirically start lactulose if pt able to take po.  -  Hold sedating medications - Appreciate neurology involvement.  - Awaiting MRI (PPM is MRI conditional, safe for MRI brain. It is [employer] MRI model [id number].) - Lethargy remains despite correction of hypoglycemia and treatment of infection. - Doubt infection could be causing this with negative cultures, afebrile, no leukocytosis. Would check cultures, CXR, UA if develops fever, cough, etc.   ESRD on hemodialysis with mild hyperkalemia and mild anemia - Nephrology providing HD on schedule.  - Continuing lasix.  Subtherapeutic INR: He is on coumadin for his vascular disease and Aflutter with a high Chads-vasc score.  He has been out of his coumadin and INR was 1.2.  - Holding coumadin as above.  CVA: Seen on motion-degraded MRI. Multiple vascular distributions involved consistent with cardioembolic/septic emboli.  - Stroke team on board, further work up in progress. - Continue statin, permissive HTN - Echo, carotid U/S - IV heparin      Above knee amputation of right lower extremity  - PT evaluation    Complete heart block: s/p PPM placement Nov 2018.  - No signs pacemaker is malfunctioning.      Atrial flutter: CHA2DS2-VASc Scoreis 5 - HR is well controlled, continue coreg.   - Heparin per pharmacy, stop coumadin    Hyperlipidemia: - Continue statin.   Bipolar/depression:  - Continue depakote and lexapro since he's taking po.  DVT prophylaxis: IV heparin. Coumadin on hold. Code Status: Full Family Communication: Wife at the bedside Disposition Plan: CIR tentatively planned. SNF being arranged as a back up plan, though this is not preferred.   Consultants:   Nephrology  IR  Neurology  General surgery  Procedures:   HD 1/21, 1/23  EEG 1/23:  Description: The patient is poorly responsive during the recording.  During maximal  wakefulness, there is a symmetric 7 Hz posterior dominant rhythm that attenuates with eye opening. This is admixed with  diffuse 4-5 Hz theta and 2-3 Hz delta slowing of the waking background.  During drowsiness, there is an increase in theta slowing of the background.  Stage 2 sleep is not seen.  During the study, he exhibited occasional brief jerk of the extremities with no electrographic correlate.  There were no epileptiform discharges or electrographic seizures seen.    EKG lead was unremarkable.  Impression: This EEG is abnormal due to diffuse slowing of the waking background.  Clinical Correlation of the above findings indicates diffuse cerebral dysfunction that is non-specific in etiology and can be seen with hypoxic/ischemic injury, toxic/metabolic encephalopathies, neurodegenerative disorders, or medication effect.  The absence of epileptiform discharges does not rule out a clinical diagnosis of epilepsy.  Clinical correlation is advised.  Antimicrobials:  Vancomycin, Zosyn  Subjective: Has become more alert, wife feels he's agitated since nicotine patch fell off. Denies pain.  Objective: Vitals:   07/24/17 0500 07/24/17 0530 07/24/17 0600 07/24/17 1213  BP: 139/71 140/81 (!) 152/81 114/67  Pulse: 74 91 88 80  Resp:   16 16  Temp:   98 F (36.7 C) 99.2 F (37.3 C)  TempSrc:   Oral Oral  SpO2:   95% 92%  Weight:   98.5 kg (217 lb 2.5 oz)   Height:        Intake/Output Summary (Last 24 hours) at 07/24/2017 1416 Last data filed at 07/24/2017 0600 Gross per 24 hour  Intake 400 ml  Output 4000 ml  Net -3600 ml   Filed Weights   07/21/17 1252 07/24/17 0150 07/24/17 0600  Weight: 101.4 kg (223 lb 8.7 oz) 102.4 kg (225 lb 12 oz) 98.5 kg (217 lb 2.5 oz)   Gen: Chronically ill-appearing 47 y.o. male alert, slightly agitated in bed today Pulm: Non-labored breathing. Clear to auscultation bilaterally.  CV: Regular rate and rhythm. No murmur, rub, or gallop. No JVD. GI: Abdomen soft, nondistended, +BS.   Ext: RLE amputation site stable, normal appearing. Left shin and ankle with sharply  demarcated hyperpigmented/purpuric lesions with surrounding tender blanchable erythema and induration without palpable abscess. No purulence noted. Skin: As above Neuro: Alert, not oriented, follows commands. Upper extremity ataxia noted now that pt cooperative with exam.   Psych: UTD  CBC: Recent Labs  Lab 07/19/17 2018  07/21/17 0900 07/22/17 0654 07/22/17 0734 07/23/17 0539 07/24/17 0758  WBC 8.2   < > 11.5* 9.9 9.0 8.4 10.1  NEUTROABS 5.4  --   --   --   --   --   --   HGB 11.2*   < > 10.2* 10.8* 10.5* 10.9* 12.2*  HCT 35.8*   < > 32.9* 34.2* 34.0* 35.7* 36.2*  MCV 88.4   < > 88.9 87.5 87.9 89.0 85.4  PLT 462*   < > 499* 495* 475* 483* 423*   < > = values in this interval not displayed.   Basic Metabolic Panel: Recent Labs  Lab 07/21/17 0900 07/22/17 0654 07/22/17 0734 07/23/17 0539 07/23/17 1519 07/24/17 0758  NA 138 136 137 135  --  136  K 6.7* 4.6 4.8 4.7  --  3.7  CL 98* 93* 94* 94*  --  94*  CO2 21* 22 24 20*  --  25  GLUCOSE 91 76 74 138*  --  131*  BUN 86* 50* 50* 68*  --  27*  CREATININE 9.93* 7.35*  7.35* 8.64*  --  5.37*  CALCIUM 8.7* 9.5 9.5 8.6*  --  8.4*  PHOS 12.8*  --  10.2*  --  12.6*  --    Liver Function Tests: Recent Labs  Lab 07/19/17 2018 07/21/17 0900 07/22/17 0734  AST 14*  --   --   ALT 13*  --   --   ALKPHOS 173*  --   --   BILITOT 1.1  --   --   PROT 7.0  --   --   ALBUMIN 3.0* 2.9* 2.9*   Radiology Studies: Mr Brain 13 Contrast  Result Date: 07/23/2017 CLINICAL DATA:  Encephalopathy, altered mental status. Suspect toxic metabolic encephalopathy. History of end-stage renal disease on dialysis, seizures, atrial fibrillation on Coumadin, kidney cancer, hypertension, hyperlipidemia. EXAM: MRI HEAD WITHOUT CONTRAST TECHNIQUE: Axial diffusion weighted imaging. Examination prematurely terminated due to patient's combative behavior. COMPARISON:  CT HEAD July 21, 2017 FINDINGS: Severely motion degraded single axial diffusion weighted  sequence. Multifocal reduced diffusion bilateral cerebellum, anterior and RIGHT parietal lobes. Low ADC values on identifiable lesions. IMPRESSION: 1. Limited motion degraded single axial diffusion weighted sequence. 2. Multiple small supra-and infratentorial diffusion abnormalities most consistent with infarcts spanning multiple vascular territories, less likely hyperacute demyelination, septic emboli, or hypercellular metastasis. 3. Acute findings text paged to [facility], Neurology via AMION secure system on 07/23/2017 at 9:15 pm. Electronically Signed   By: Elon Alas M.D.   On: 07/23/2017 21:17    Scheduled Meds: . amLODipine  10 mg Oral Daily  . carvedilol  25 mg Oral BID WC  . cinacalcet  60 mg Oral Q breakfast  . cloNIDine  0.2 mg Transdermal Q Tue  . divalproex  500 mg Oral Q12H  . escitalopram  10 mg Oral Daily  . ferric citrate  420 mg Oral TID WC  . furosemide  120 mg Oral BID  . gabapentin  100 mg Oral BID  . hydrALAZINE  100 mg Oral Q8H  . irbesartan  300 mg Oral Daily  . isosorbide dinitrate  10 mg Oral BID  . lactulose  20 g Oral TID  . multivitamin  1 tablet Oral QHS  . nicotine  21 mg Transdermal Daily  . pravastatin  40 mg Oral Daily   Continuous Infusions: . heparin 1,800 Units/hr (07/24/17 1401)  . piperacillin-tazobactam (ZOSYN)  IV Stopped (07/24/17 1246)  . vancomycin Stopped (07/23/17 1459)     LOS: 4 days   Time spent: 35 minutes.  Vance Gather, MD [name] Pager [phone]  If 7PM-7AM, please contact night-coverage [email or web address] Password [email or web address] 07/24/2017, 2:16 PM

## 2017-07-24 NOTE — Care Management Important Message (Signed)
Important Message  Patient Details  Name: Hayden [name] MRN: [id number] Date of Birth: January 16, 1971   Medicare Important Message Given:  Yes    Orbie Pyo 07/24/2017, 2:13 PM

## 2017-07-24 NOTE — Social Work (Signed)
CSW sent documents to [facility] to obtain [facility] for SNF placement as a back up.  CSW will continue to follow for PASSR and IP Rehab.  Elissa Hefty, LCSW Clinical Social Worker [phone]

## 2017-07-24 NOTE — Progress Notes (Signed)
ANTICOAGULATION CONSULT NOTE - Follow Up Consult  Pharmacy Consult:  Heparin Indication:  Aflutter  Allergies  Allergen Reactions  . Dilaudid [Hydromorphone Hcl] Other (See Comments)    ABNORMAL BEHAVIOR, "VERBALLY AND PHYSICALLY ABUSIVE," PSYCHOSIS  . Morphine And Related Itching  . Adhesive [Tape] Other (See Comments)    Redness from adhesive tape if left on too long, paper tape is preferred    Patient Measurements: Height: 5\' 10"  (177.8 cm) Weight: 217 lb 2.5 oz (98.5 kg) IBW/kg (Calculated) : 73  Heparin dosing weight: 94 kg  Vital Signs: Temp: 98.3 F (36.8 C) (01/24 2058) Temp Source: Oral (01/24 2058) BP: 132/67 (01/24 2058) Pulse Rate: 73 (01/24 2058)  Labs: Recent Labs    07/22/17 0654 07/22/17 0734 07/23/17 0539 07/24/17 0758 07/24/17 1021 07/24/17 2142  HGB 10.8* 10.5* 10.9* 12.2*  --   --   HCT 34.2* 34.0* 35.7* 36.2*  --   --   PLT 495* 475* 483* 423*  --   --   LABPROT 18.8*  --  22.9*  --  17.0*  --   INR 1.59  --  2.04  --  1.40  --   HEPARINUNFRC  --   --   --   --  <0.10* <0.10*  CREATININE 7.35* 7.35* 8.64* 5.37*  --   --     Estimated Creatinine Clearance: 20.4 mL/min (A) (by C-G formula based on SCr of 5.37 mg/dL (H)).    Assessment: 77 YOM who was on Coumadin PTA for history of AFib.  Now concerned with skin necrosis and patient needs a biopsy.  Pharmacy consulted to transition patient back to IV heparin.  INR now sub-therapeutic s/p Vitamin K.  Heparin level is undetectable (he was previously sub-therapeutic on 1650 units/hr).  No overt bleeding reported.  Heparin level remains undetectable after rate increase this morning.   Goal of Therapy:  Heparin level 0.3-0.7 units/ml Monitor platelets by anticoagulation protocol: Yes     Plan:  Heparin bolus 2000 units IV x 1, then Increase heparin gtt back to 1950 units/hr Check 8 hr heparin level at 0630 Daily heparin level and CBC Monitor for s/sx of bleeding  Maryanna Shape, PharmD, BCPS   Clinical Pharmacist  Pager: [phone]   07/24/2017, 10:30 PM

## 2017-07-24 NOTE — Progress Notes (Signed)
  Echocardiogram 2D Echocardiogram has been performed.  Hayden [name] T Hayden [name] 07/24/2017, 3:05 PM

## 2017-07-24 NOTE — Progress Notes (Signed)
ANTICOAGULATION CONSULT NOTE - Follow Up Consult  Pharmacy Consult:  Heparin Indication:  Aflutter  Allergies  Allergen Reactions  . Dilaudid [Hydromorphone Hcl] Other (See Comments)    ABNORMAL BEHAVIOR, "VERBALLY AND PHYSICALLY ABUSIVE," PSYCHOSIS  . Morphine And Related Itching  . Adhesive [Tape] Other (See Comments)    Redness from adhesive tape if left on too long, paper tape is preferred    Patient Measurements: Height: 5\' 10"  (177.8 cm) Weight: 217 lb 2.5 oz (98.5 kg) IBW/kg (Calculated) : 73  Heparin dosing weight: 94 kg  Vital Signs: Temp: 99.2 F (37.3 C) (01/24 1213) Temp Source: Oral (01/24 1213) BP: 114/67 (01/24 1213) Pulse Rate: 80 (01/24 1213)  Labs: Recent Labs    07/22/17 0654 07/22/17 0734 07/23/17 0539 07/24/17 0758 07/24/17 1021  HGB 10.8* 10.5* 10.9* 12.2*  --   HCT 34.2* 34.0* 35.7* 36.2*  --   PLT 495* 475* 483* 423*  --   LABPROT 18.8*  --  22.9*  --  17.0*  INR 1.59  --  2.04  --  1.40  HEPARINUNFRC  --   --   --   --  <0.10*  CREATININE 7.35* 7.35* 8.64* 5.37*  --     Estimated Creatinine Clearance: 20.4 mL/min (A) (by C-G formula based on SCr of 5.37 mg/dL (H)).    Assessment: 36 YOM who was on Coumadin PTA for history of AFib.  Now concerned with skin necrosis and patient needs a biopsy.  Pharmacy consulted to transition patient back to IV heparin.  INR now sub-therapeutic s/p Vitamin K.  Heparin level is undetectable (he was previously sub-therapeutic on 1650 units/hr).  No overt bleeding reported.   Goal of Therapy:  Heparin level 0.3-0.7 units/ml Monitor platelets by anticoagulation protocol: Yes     Plan:  Heparin bolus 3000 units IV x 1, then Increase heparin gtt back to 1800 units/hr Check 8 hr heparin level Daily heparin level and CBC Monitor for s/sx of bleeding   Cyrah Mclamb D. Mina Marble, PharmD, BCPS Pager:  [phone] 07/24/2017, 1:10 PM
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
## 2017-07-24 NOTE — Progress Notes (Signed)
[facility] Progress Note  Subjective: more alert again today  Vitals:   07/24/17 0430 07/24/17 0500 07/24/17 0530 07/24/17 0600  BP: 131/78 139/71 140/81 (!) 152/81  Pulse: 80 74 91 88  Resp:    16  Temp:    98 F (36.7 C)  TempSrc:    Oral  SpO2:    95%  Weight:    98.5 kg (217 lb 2.5 oz)  Height:        Inpatient medications: . amLODipine  10 mg Oral Daily  . carvedilol  25 mg Oral BID WC  . cinacalcet  60 mg Oral Q breakfast  . cloNIDine  0.2 mg Transdermal Q Tue  . divalproex  500 mg Oral Q12H  . escitalopram  10 mg Oral Daily  . ferric citrate  420 mg Oral TID WC  . furosemide  120 mg Oral BID  . gabapentin  100 mg Oral BID  . hydrALAZINE  100 mg Oral Q8H  . irbesartan  300 mg Oral Daily  . isosorbide dinitrate  10 mg Oral BID  . lactulose  20 g Oral TID  . multivitamin  1 tablet Oral QHS  . nicotine  21 mg Transdermal Daily  . pravastatin  40 mg Oral Daily   . heparin 950 Units/hr (07/23/17 1744)  . piperacillin-tazobactam (ZOSYN)  IV 3.375 g (07/24/17 0846)  . vancomycin Stopped (07/23/17 1459)   lidocaine, nitroGLYCERIN, senna-docusate  Exam: Still lethargic, not responding No jvd Chest cta bilat RRR 3/6 holosyst M abd obese soft ntnd R AKA w/o edema L lower leg extensive serpentine purplish discoloration w several small areas of eschar, tender to palpation, no SQ nodules L AVF+bruit Confused   Dialysis: MWF [facility] 4h  450/800  98kg   2/2.25 bath  L AVF Hep 2400 - Hectoral 70mcg IV q HD - Venofer 100mg  x 10 ordered (4 given) - Mircera 221mcg IV q 2 weeks (last 1/18) -home BP Rx  norvasc 10, coreg 25 bid, clon patch 0.2 weekly, lasix 120 bid, hydral 100 tid, avapro 300       Impression: 1 Cellulitis , +leg eschar 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, coumadin skin necrosis 2 ESRD HD MWF 3 AMS improving 4 Bipolar 5 HTN better, mult bp meds, bp ok 6 Anemia 7 DM2 8 PVD 9 Aflutter  Plan - HD Fri, biopsy per surg, IV hep for now  Kelly Splinter MD [facility] pager [phone]   07/24/2017, 12:07 PM   Recent Labs  Lab 07/21/17 0900  07/22/17 0734 07/23/17 0539 07/23/17 1519 07/24/17 0758  NA 138   < > 137 135  --  136  K 6.7*   < > 4.8 4.7  --  3.7  CL 98*   < > 94* 94*  --  94*  CO2 21*   < > 24 20*  --  25  GLUCOSE 91   < > 74 138*  --  131*  BUN 86*   < > 50* 68*  --  27*  CREATININE 9.93*   < > 7.35* 8.64*  --  5.37*  CALCIUM 8.7*   < > 9.5 8.6*  --  8.4*  PHOS 12.8*  --  10.2*  --  12.6*  --    < > = values in this interval not displayed.   Recent Labs  Lab 07/19/17 2018 07/21/17 0900 07/22/17 0734  AST 14*  --   --   ALT 13*  --   --  ALKPHOS 173*  --   --   BILITOT 1.1  --   --   PROT 7.0  --   --   ALBUMIN 3.0* 2.9* 2.9*   Recent Labs  Lab 07/19/17 2018  07/22/17 0734 07/23/17 0539 07/24/17 0758  WBC 8.2   < > 9.0 8.4 10.1  NEUTROABS 5.4  --   --   --   --   HGB 11.2*   < > 10.5* 10.9* 12.2*  HCT 35.8*   < > 34.0* 35.7* 36.2*  MCV 88.4   < > 87.9 89.0 85.4  PLT 462*   < > 475* 483* 423*   < > = values in this interval not displayed.   Iron/TIBC/Ferritin/ %Sat    Component Value Date/Time   IRON 17 (L) 07/24/2017 0758   TIBC 197 (L) 07/24/2017 0758   FERRITIN 1,427 (H) 07/24/2017 0758   IRONPCTSAT 9 (L) 07/24/2017 4239

## 2017-07-25 LAB — BASIC METABOLIC PANEL
ANION GAP: 19 — AB (ref 5–15)
BUN: 45 mg/dL — ABNORMAL HIGH (ref 6–20)
CHLORIDE: 94 mmol/L — AB (ref 101–111)
CO2: 24 mmol/L (ref 22–32)
Calcium: 8.6 mg/dL — ABNORMAL LOW (ref 8.9–10.3)
Creatinine, Ser: 7.15 mg/dL — ABNORMAL HIGH (ref 0.61–1.24)
GFR, EST AFRICAN AMERICAN: 10 mL/min — AB (ref >60)
GFR, EST NON AFRICAN AMERICAN: 8 mL/min — AB (ref >60)
Glucose, Bld: 93 mg/dL (ref 65–99)
POTASSIUM: 4.2 mmol/L (ref 3.5–5.1)
SODIUM: 137 mmol/L (ref 135–145)

## 2017-07-25 LAB — CULTURE, BLOOD (ROUTINE X 2)
CULTURE: NO GROWTH
Culture: NO GROWTH
SPECIAL REQUESTS: ADEQUATE
Special Requests: ADEQUATE

## 2017-07-25 LAB — PROTIME-INR
INR: 1.31
Prothrombin Time: 16.1 seconds — ABNORMAL HIGH (ref 11.4–15.2)

## 2017-07-25 LAB — GLUCOSE, CAPILLARY
GLUCOSE-CAPILLARY: 109 mg/dL — AB (ref 65–99)
GLUCOSE-CAPILLARY: 154 mg/dL — AB (ref 65–99)

## 2017-07-25 LAB — CBC
HEMATOCRIT: 34.5 % — AB (ref 39.0–52.0)
Hemoglobin: 10.8 g/dL — ABNORMAL LOW (ref 13.0–17.0)
MCH: 27.3 pg (ref 26.0–34.0)
MCHC: 31.3 g/dL (ref 30.0–36.0)
MCV: 87.1 fL (ref 78.0–100.0)
Platelets: 376 10*3/uL (ref 150–400)
RBC: 3.96 MIL/uL — AB (ref 4.22–5.81)
RDW: 19.5 % — AB (ref 11.5–15.5)
WBC: 7.6 10*3/uL (ref 4.0–10.5)

## 2017-07-25 LAB — HEPARIN LEVEL (UNFRACTIONATED): Heparin Unfractionated: <0.1 IU/mL — ABNORMAL LOW (ref 0.30–0.70)

## 2017-07-25 LAB — AMMONIA: Ammonia: 33 umol/L (ref 9–35)

## 2017-07-25 MED ORDER — LIDOCAINE HCL (CARDIAC) 20 MG/ML IV SOLN
INTRAVENOUS | Status: AC
  Administered 2017-07-25: 100 mg
  Filled 2017-07-25: qty 5

## 2017-07-25 MED ORDER — LIDOCAINE-PRILOCAINE 2.5-2.5 % EX CREA
1.0000 "application " | TOPICAL_CREAM | CUTANEOUS | Status: DC | PRN
  Filled 2017-07-25: qty 5

## 2017-07-25 MED ORDER — PENTAFLUOROPROP-TETRAFLUOROETH EX AERO: 1.0000 "application " | INHALATION_SPRAY | CUTANEOUS | Status: DC | PRN

## 2017-07-25 MED ORDER — ALTEPLASE 2 MG IJ SOLR
2.0000 mg | Freq: Once | INTRAMUSCULAR | Status: DC | PRN
  Filled 2017-07-25: qty 2

## 2017-07-25 MED ORDER — HEPARIN SODIUM (PORCINE) 1000 UNIT/ML DIALYSIS: 2000.0000 [IU] | Freq: Once | INTRAMUSCULAR | Status: DC

## 2017-07-25 MED ORDER — SODIUM CHLORIDE 0.9 % IV SOLN: 100.0000 mL | INTRAVENOUS | Status: DC | PRN

## 2017-07-25 MED ORDER — HEPARIN SODIUM (PORCINE) 1000 UNIT/ML DIALYSIS
1000.0000 [IU] | INTRAMUSCULAR | Status: DC | PRN
  Filled 2017-07-25: qty 1

## 2017-07-25 MED ORDER — OXYCODONE HCL 5 MG PO TABS
10.0000 mg | ORAL_TABLET | Freq: Four times a day (QID) | ORAL | Status: DC | PRN
  Administered 2017-07-25 – 2017-07-26 (×3): 10 mg via ORAL
  Filled 2017-07-25 (×3): qty 2

## 2017-07-25 MED ORDER — LIDOCAINE HCL (PF) 1 % IJ SOLN: 5.0000 mL | INTRAMUSCULAR | Status: DC | PRN

## 2017-07-25 MED ORDER — HEPARIN (PORCINE) IN NACL 100-0.45 UNIT/ML-% IJ SOLN
2700.0000 [IU]/h | INTRAMUSCULAR | Status: DC
  Administered 2017-07-25: 2350 [IU]/h via INTRAVENOUS
  Administered 2017-07-26 – 2017-07-28 (×5): 2550 [IU]/h via INTRAVENOUS
  Administered 2017-07-29: 2600 [IU]/h via INTRAVENOUS
  Administered 2017-07-30: 2700 [IU]/h via INTRAVENOUS
  Filled 2017-07-25 (×15): qty 250

## 2017-07-25 MED ORDER — HEPARIN SODIUM (PORCINE) 1000 UNIT/ML DIALYSIS: 2400.0000 [IU] | Freq: Once | INTRAMUSCULAR | Status: DC

## 2017-07-25 MED ORDER — OXYCODONE-ACETAMINOPHEN 5-325 MG PO TABS
2.0000 | ORAL_TABLET | Freq: Once | ORAL | Status: AC
  Administered 2017-07-25: 2 via ORAL

## 2017-07-25 NOTE — Progress Notes (Signed)
ANTICOAGULATION CONSULT NOTE - Follow Up Consult  Pharmacy Consult:  Heparin Indication:  Aflutter  Patient Measurements: Height: 5\' 10"  (177.8 cm) Weight: 213 lb 10 oz (96.9 kg) IBW/kg (Calculated) : 73  Heparin dosing weight: 94 kg  Vital Signs: Temp: 97.8 F (36.6 C) (01/25 2139) Temp Source: Oral (01/25 2139) BP: 154/90 (01/25 2139) Pulse Rate: 78 (01/25 2139)  Labs: Recent Labs    07/23/17 0539 07/24/17 0758  07/24/17 1021 07/24/17 2142 07/25/17 0649 07/25/17 1921  HGB 10.9* 12.2*  --   --   --  10.8*  --   HCT 35.7* 36.2*  --   --   --  34.5*  --   PLT 483* 423*  --   --   --  376  --   LABPROT 22.9*  --   --  17.0*  --  16.1*  --   INR 2.04  --   --  1.40  --  1.31  --   HEPARINUNFRC  --   --    < > <0.10* <0.10* <0.10* <0.10*  CREATININE 8.64* 5.37*  --   --   --  7.15*  --    < > = values in this interval not displayed.    Estimated Creatinine Clearance: 15.2 mL/min (A) (by C-G formula based on SCr of 7.15 mg/dL (H)).    Assessment: 79 YOM who was on Coumadin PTA for history of AFib.  Now concerned with skin necrosis and patient needs a biopsy.  Pharmacy consulted to transition patient back to IV heparin.   Noted concern for possible new CVA vs septic emboli - will hold boluses and lower the goal to 0.3-0.5.  INR 1.4 s/p Vitamin K on 1/23. Heparin level remains undetectable after a rate increase yesterday evening. CBC low but stable - no bleeding noted at this time.   Heparin level remains undetectable despite several increases to rate.  Per RN, no issues with infusion.  Goal of Therapy:  Heparin level 0.3-0.5 units/ml Monitor platelets by anticoagulation protocol: Yes   Plan:  - Increase Heparin to 2350 units/hr (23.5 ml/hr) - Recheck heparin level in 8 hrs. - Will follow-up plans for the biopsy and any oral AC plans - Will continue to monitor for any signs/symptoms of bleeding  Thank you for allowing pharmacy to be a part of this patient's  care.   Uvaldo Rising, BCPS  Clinical Pharmacist Pager [phone]  07/25/2017 10:14 PM

## 2017-07-25 NOTE — Progress Notes (Signed)
[facility] Progress Note  Subjective: L leg hurting, no other c/o  Vitals:   07/25/17 0735 07/25/17 0800 07/25/17 0830 07/25/17 0900  BP: (!) 149/74 126/71 140/70 123/61  Pulse: 82 80 85 77  Resp: 18 17 18 17   Temp:      TempSrc:      SpO2:      Weight:      Height:        Inpatient medications: . amLODipine  10 mg Oral Daily  . carvedilol  25 mg Oral BID WC  . cinacalcet  60 mg Oral Q breakfast  . cloNIDine  0.2 mg Transdermal Q Tue  . divalproex  500 mg Oral Q12H  . escitalopram  10 mg Oral Daily  . ferric citrate  420 mg Oral TID WC  . furosemide  120 mg Oral BID  . gabapentin  100 mg Oral BID  . [START ON 07/26/2017] heparin  2,000 Units Dialysis Once in dialysis  . hydrALAZINE  100 mg Oral Q8H  . irbesartan  300 mg Oral Daily  . isosorbide dinitrate  10 mg Oral BID  . lactulose  10 g Oral BID  . multivitamin  1 tablet Oral QHS  . nicotine  21 mg Transdermal Daily  . pravastatin  40 mg Oral Daily   . sodium chloride    . sodium chloride    . heparin 1,950 Units/hr (07/25/17 0630)  . piperacillin-tazobactam (ZOSYN)  IV Stopped (07/25/17 0008)  . vancomycin Stopped (07/23/17 1459)   sodium chloride, sodium chloride, alteplase, heparin, lidocaine (PF), lidocaine, lidocaine-prilocaine, nitroGLYCERIN, pentafluoroprop-tetrafluoroeth, senna-docusate  Exam: Awake, alert  No jvd Chest cta bilat RRR 3/6 holosyst M abd obese soft ntnd R AKA w/o edema L lower leg extensive serpentine purplish discoloration w a few small areas of eschar, tender to palpation, no SQ nodules L AVF+bruit   Dialysis: MWF NW 4h  450/800  98kg   2/2.25 bath  L AVF Hep 2400 - Hectoral 7mcg IV q HD - Venofer 100mg  x 10 ordered (4 given) - Mircera 221mcg IV q 2 weeks (last 1/18) -home BP Rx  norvasc 10, coreg 25 bid, clon patch 0.2 weekly, lasix 120 bid, hydral 100 tid, avapro 300      Impression: 1 Cellulitis , +leg eschar 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 - auryxia/ sensipar, continue  Plan - HD today, biopsy per surg, IV hep for now  Kelly Splinter MD [facility] pager [phone]   07/25/2017, 9:19 AM   Recent Labs  Lab 07/21/17 0900  07/22/17 0734 07/23/17 0539 07/23/17 1519 07/24/17 0758 07/25/17 0649  NA 138   < > 137 135  --  136 137  K 6.7*   < > 4.8 4.7  --  3.7 4.2  CL 98*   < > 94* 94*  --  94* 94*  CO2 21*   < > 24 20*  --  25 24  GLUCOSE 91   < > 74 138*  --  131* 93  BUN 86*   < > 50* 68*  --  27* 45*  CREATININE 9.93*   < > 7.35* 8.64*  --  5.37* 7.15*  CALCIUM 8.7*   < > 9.5 8.6*  --  8.4* 8.6*  PHOS 12.8*  --  10.2*  --  12.6*  --   --    < > = values in this interval not displayed.   Recent Labs  Lab 07/19/17 2018 07/21/17 0900 07/22/17 0734  AST 14*  --   --   ALT 13*  --   --   ALKPHOS 173*  --   --   BILITOT 1.1  --   --   PROT 7.0  --   --   ALBUMIN 3.0* 2.9* 2.9*   Recent Labs  Lab 07/19/17 2018  07/23/17 0539 07/24/17 0758 07/25/17 0649  WBC 8.2   < > 8.4 10.1 7.6  NEUTROABS 5.4  --   --   --   --   HGB 11.2*   < > 10.9* 12.2* 10.8*  HCT 35.8*   < > 35.7* 36.2* 34.5*  MCV 88.4   < > 89.0 85.4 87.1  PLT 462*   < > 483* 423* 376   < > = values in this interval not displayed.   Iron/TIBC/Ferritin/ %Sat    Component Value Date/Time   IRON 17 (L) 07/24/2017 0758   TIBC 197 (L) 07/24/2017 0758   FERRITIN 1,427 (H) 07/24/2017 0758   IRONPCTSAT 9 (L) 07/24/2017 6144

## 2017-07-25 NOTE — Progress Notes (Signed)
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 Pt presented to the ED 1/19 with left lower extremity redness and pain. Pt became encephalopic and MRI on 1/23 revealed multiple small supra-and infratentorial diffusion abnormalities most consistent with infarcts spanning multiple vascular territories.    PT Comments    Pt more alert this session and able to assess mobility further. Pt agreeable to OOB to chair, and was able to complete an anterior-posterior transfer to recliner with +2 mod assist and bed pad for scooting support. Discussed the options for follow up therapy with pt and son, and feel this patient would be an excellent CIR candidate. Feel pt could tolerate the intensity of CIR program and would maximize functional independence to decrease caregiver burden. Will continue to follow and progress as able per POC.    Follow Up Recommendations  CIR;Supervision/Assistance - 24 hour     Equipment Recommendations  None recommended by PT    Recommendations for Other Services       Precautions / Restrictions Precautions Precautions: Fall Restrictions Weight Bearing Restrictions: No    Mobility  Bed Mobility               General bed mobility comments: Pt sitting up in long sitting in bed. Pt overall very close to foot board but pt maintaining sitting balance up right without assistance.   Transfers Overall transfer level: Needs assistance   Transfers: Comptroller transfers: Mod assist;+2 physical assistance   General transfer comment: Pt was cued for sequencing to turn his body in  preparation for anterior-posterior transfer. Pt required assist with bed pad for scooting and cues for hand placement to gain the most leverage.   Ambulation/Gait                 Stairs            Wheelchair Mobility    Modified Rankin (Stroke Patients Only)       Balance Overall balance assessment: Needs assistance Sitting-balance support: Bilateral upper extremity supported Sitting balance-Leahy Scale: Fair                                      Cognition Arousal/Alertness: Lethargic Behavior During Therapy: Flat affect Overall Cognitive Status: No family/caregiver present to determine baseline cognitive functioning Area of Impairment: Safety/judgement;Awareness;Problem solving                         Safety/Judgement: Decreased awareness of safety Awareness: Emergent Problem Solving: Slow processing;Difficulty sequencing General Comments: Son present but not able to get a feel for whether pt is at his baseline cognitively. Pt appeared to have difficulty sequencing during anterior-posterior transfer and overall decreased awareness of safety       Exercises      General Comments        Pertinent Vitals/Pain Pain Assessment: Faces Faces Pain Scale: Hurts even more Pain Location: Pt almost constantly rubbing his L lower leg due to pain.    Home Living  Prior Function            PT Goals (current goals can now be found in the care plan section) Acute Rehab PT Goals PT Goal Formulation: With family Time For Goal Achievement: 08/05/17 Potential to Achieve Goals: Good Progress towards PT goals: Progressing toward goals    Frequency    Min 3X/week      PT Plan Current plan remains appropriate    Co-evaluation              AM-PAC PT "6 Clicks" Daily Activity  Outcome Measure  Difficulty turning over in bed (including adjusting bedclothes, sheets and blankets)?: A Lot Difficulty  moving from lying on back to sitting on the side of the bed? : A Lot Difficulty sitting down on and standing up from a chair with arms (e.g., wheelchair, bedside commode, etc,.)?: Unable Help needed moving to and from a bed to chair (including a wheelchair)?: Total Help needed walking in hospital room?: Total Help needed climbing 3-5 steps with a railing? : Total 6 Click Score: 8    End of Session Equipment Utilized During Treatment: Gait belt Activity Tolerance: Patient tolerated treatment well Patient left: in chair;with call bell/phone within reach;with family/visitor present Nurse Communication: Mobility status PT Visit Diagnosis: Other abnormalities of gait and mobility (R26.89)     Time: 1430-1450 PT Time Calculation (min) (ACUTE ONLY): 20 min  Charges:  $Therapeutic Activity: 8-22 mins                    G Codes:       Rolinda Roan, PT, DPT Acute Rehabilitation Services Pager: [place] 07/25/2017, 3:18 PM

## 2017-07-25 NOTE — Progress Notes (Signed)
PROGRESS NOTE  Hayden [name]  DUK:[id number] DOB: [birth year] DOA: 07/19/2017 PCP: [name], DO   Brief Narrative: Hayden [name] is a 47 y.o. 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 XR showed calcified vessels without evidence of emphysema or fracture. Vancomycin and zosyn were given, nephrology consulted for HD while inpatient, and he was admitted. Fistulogram performed 1/22 showed widely patent AVF circuit. The patient grew more lethargic following the procedure. Neurology was consulted, sedating medications held. EEG showed nonspecific slowing. MRI showed multiple small supra- and infratentorial diffusion abnormalities most consistent with infarcts spanning multiple vascular territories.   Assessment & Plan: Cellulitis and abscess of left lower extremity: He has risk factors including h/o DM, PVD w/hx right AKA. U/S negative for DVT. Blood cultures negative.  - Erythema is stable. No abscess. Splotchy hyperpigmented areas increased. Continue vanc/zosyn for now.  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 Pt on heparin with INR <1.5, ok to DC whenever needed for Bx.  - Stopped coumadin, started heparin, holding Ca. Phos binder per nephrology.   Acute metabolic encephalopathy: Overall significantly improving. With waxing/waning course, suspect delirium worsened by sedating medications and CVA. EEG 1/23 nonspecific slowing without epileptiform discharges. No seizure-like activity reported. Not hypercarbic. ?behavioral component.  - Ammonia mildly elevated, empirically started lactulose. - Hold sedating medications as able. Will reorder  oxycodone with less frequent dosing prn.  CVA: Seen on motion-degraded MRI. Multiple vascular distributions involved consistent with cardioembolic/septic emboli. Echo showed no thrombus.  - Stroke team on board, further work up in progress. - Continue statin, permissive HTN - Carotid U/S pending.  - IV heparin  Chronic systolic CHF: Echo with newly depressed LVEF to 35-40% from 60-65% with akinesis of mid-apicalanteroseptal myocardium. No wall motion abnormalities on last echo in Aug 2018. Also showed severe aortic stenosis, mitral stenosis. Pt has no chest pain. - Cardiology nonurgently consulted.   ESRD on hemodialysis with mild hyperkalemia and mild anemia - Nephrology providing HD on schedule.     Above knee amputation of right lower extremity  - PT evaluation    Complete heart block: s/p PPM placement Nov 2018.  - No signs pacemaker is malfunctioning.      Atrial flutter: CHA2DS2-VASc Scoreis 5 - HR is well controlled, continue coreg.   - Heparin per pharmacy, stopped coumadin    Hyperlipidemia: - Continue statin.   Bipolar/depression:  - Continue depakote and lexapro since he's taking po.  Subtherapeutic INR: On arrival  DVT prophylaxis: IV heparin. Coumadin on hold. Code Status: Full Family Communication: None at the bedside, pt seen in HD. Disposition Plan: CIR tentatively planned, possibly 1/28. SNF being arranged as a back up plan, though this is not preferred.   Consultants:   Nephrology  IR  Neurology  General surgery  Cardiology  Procedures:   HD 1/21, 1/23, 1/25  EEG 1/23:  The patient is poorly responsive during the recording.  During maximal wakefulness, there is a symmetric 7 Hz posterior dominant rhythm that attenuates with eye opening. This is admixed with diffuse 4-5 Hz theta and 2-3 Hz delta slowing of the waking background.  During drowsiness, there is an increase in theta slowing  of the background.  Stage 2 sleep is not seen.  During the  study, he exhibited occasional brief jerk of the extremities with no electrographic correlate.  There were no epileptiform discharges or electrographic seizures seen.   EKG lead was unremarkable.  Impression: This EEG is abnormal due to diffuse slowing of the waking background.  Echocardiogram 07/24/2017: - Left ventricle: The cavity size was normal. Wall thickness was   increased in a pattern of severe LVH. Systolic function was   moderately reduced. The estimated ejection fraction was in the   range of 35% to 40%. There is akinesis of the apical myocardium.   There is akinesis of the mid-apicalanteroseptal myocardium. - Aortic valve: Valve mobility was restricted. There was severe   stenosis. Valve area (VTI): 0.44 cm^2. Valve area (Vmax): 0.38   cm^2. Valve area (Vmean): 0.41 cm^2. - Mitral valve: Severely calcified annulus. The findings are   consistent with severe stenosis. Valve area by pressure   half-time: 1.84 cm^2. Valve area by continuity equation (using   LVOT flow): 0.95 cm^2. - Left atrium: The atrium was moderately dilated. - Right ventricle: The cavity size was moderately dilated. - Pulmonary arteries: PA peak pressure: 32 mm Hg (S).  Impressions: - Akinesis of the distal anteroseptal wall and apex with overall   moderate LV dysfunction; severe LVH; heavily calcified aortic   valve with severe AS (mean gradient 60 mmHg); severe MAC with   severe MS (mean gradient 12 mmHg); moderate LAE; moderate RVE;   mild TR.  Antimicrobials:  Vancomycin, Zosyn  Subjective: Talkative, grumpy (which is his baseline). LLE pain is severe, he is now more able to vocalize this. No fevers. Denies chest pain or dyspnea, but has not been mobile for several days due to lethargy.  Objective: Vitals:   07/25/17 1030 07/25/17 1100 07/25/17 1130 07/25/17 1135  BP: 122/73 (!) 141/69 (!) 145/56 (!) 151/86  Pulse: 72 76 77 79  Resp: _0 Temp:    (!) 96.9 F (36.1 C)  TempSrc:     Oral  SpO2:    99%  Weight:    96.9 kg (213 lb 10 oz)  Height:        Intake/Output Summary (Last 24 hours) at 07/25/2017 1526 Last data filed at 07/25/2017 1135 Gross per 24 hour  Intake 240 ml  Output 2000 ml  Net -1760 ml   Filed Weights   07/24/17 0600 07/25/17 0730 07/25/17 1135  Weight: 98.5 kg (217 lb 2.5 oz) 98 kg (216 lb 0.8 oz) 96.9 kg (213 lb 10 oz)   Gen: Chronically ill-appearing, grumpy but affable 47 y.o. male alert in HD Pulm: Non-labored breathing. Clear bilaterally CV: RRR, II/VI systolic murmur throughout. No JVD. GI: Abdomen soft, nondistended, +BS.   Ext: RLE upper femur amputation site stable, normal appearing. Left shin with sharply demarcated hyperpigmented/purpuric lesions with surrounding tender blanchable erythema and induration without palpable abscess. No purulence noted. (Pictures taken 1/25 below) Skin: As above Neuro: Alert, oriented. Upper extremity ataxia noted bilaterally   Psych: Mood euthymic, affect congruent.   07/25/2017   07/24/2017  CBC: Recent Labs  Lab 07/19/17 2018  07/22/17 0654 07/22/17 0734 07/23/17 0539 07/24/17 0758 07/25/17 0649  WBC 8.2   < > 9.9 9.0 8.4 10.1 7.6  NEUTROABS 5.4  --   --   --   --   --   --   HGB 11.2*   < > 10.8* 10.5* 10.9* 12.2* 10.8*  HCT 35.8*   < > 34.2* 34.0* 35.7* 36.2* 34.5*  MCV 88.4   < > 87.5 87.9 89.0 85.4 87.1  PLT 462*   < > 495* 475* 483* 423* 376   < > = values in this interval not displayed.   Basic Metabolic Panel: Recent Labs  Lab 07/21/17 0900 07/22/17 0654 07/22/17 0734 07/23/17 0539 07/23/17 1519 07/24/17 0758 07/25/17 0649  NA 138 136 137 135  --  136 137  K 6.7* 4.6 4.8 4.7  --  3.7 4.2  CL 98* 93* 94* 94*  --  94* 94*  CO2 21* 22 24 20*  --  25 24  GLUCOSE 91 76 74 138*  --  131* 93  BUN 86* 50* 50* 68*  --  27* 45*  CREATININE 9.93* 7.35* 7.35* 8.64*  --  5.37* 7.15*  CALCIUM 8.7* 9.5 9.5 8.6*  --  8.4* 8.6*  PHOS 12.8*  --  10.2*  --  12.6*  --   --     Liver Function Tests: Recent Labs  Lab 07/19/17 2018 07/21/17 0900 07/22/17 0734  AST 14*  --   --   ALT 13*  --   --   ALKPHOS 173*  --   --   BILITOT 1.1  --   --   PROT 7.0  --   --   ALBUMIN 3.0* 2.9* 2.9*   Radiology Studies: Mr Brain 83 Contrast  Result Date: 07/23/2017 CLINICAL DATA:  Encephalopathy, altered mental status. Suspect toxic metabolic encephalopathy. History of end-stage renal disease on dialysis, seizures, atrial fibrillation on Coumadin, kidney cancer, hypertension, hyperlipidemia. EXAM: MRI HEAD WITHOUT CONTRAST TECHNIQUE: Axial diffusion weighted imaging. Examination prematurely terminated due to patient's combative behavior. COMPARISON:  CT HEAD July 21, 2017 FINDINGS: Severely motion degraded single axial diffusion weighted sequence. Multifocal reduced diffusion bilateral cerebellum, anterior and RIGHT parietal lobes. Low ADC values on identifiable lesions. IMPRESSION: 1. Limited motion degraded single axial diffusion weighted sequence. 2. Multiple small supra-and infratentorial diffusion abnormalities most consistent with infarcts spanning multiple vascular territories, less likely hyperacute demyelination, septic emboli, or hypercellular metastasis. 3. Acute findings text paged to [place], Neurology via AMION secure system on 07/23/2017 at 9:15 pm. Electronically Signed   By: Elon Alas M.D.   On: 07/23/2017 21:17   Scheduled Meds: . lidocaine (cardiac) 100 mg/38m      . amLODipine  10 mg Oral Daily  . carvedilol  25 mg Oral BID WC  . cinacalcet  60 mg Oral Q breakfast  . cloNIDine  0.2 mg Transdermal Q Tue  . divalproex  500 mg Oral Q12H  . escitalopram  10 mg Oral Daily  . ferric citrate  420 mg Oral TID WC  . furosemide  120 mg Oral BID  . gabapentin  100 mg Oral BID  . hydrALAZINE  100 mg Oral Q8H  . irbesartan  300 mg Oral Daily  . isosorbide dinitrate  10 mg Oral BID  . lactulose  10 g Oral BID  . multivitamin  1 tablet Oral QHS   . nicotine  21 mg Transdermal Daily  . pravastatin  40 mg Oral Daily   Continuous Infusions: . heparin 1,950 Units/hr (07/25/17 0630)  . piperacillin-tazobactam (ZOSYN)  IV Stopped (07/25/17 0008)  . vancomycin 1,000 mg (07/25/17 1027)    LOS: 5 days   Time spent: 25 minutes.  RVance Gather MD [name] Pager [phone] If 7PM-7AM, please contact night-coverage [email or web address] Password [email or web address]1/25/2019, 3:26 PM

## 2017-07-25 NOTE — Progress Notes (Signed)
Inpatient Rehabilitation  Met with spouse at bedside, who is familiar with our program.  She states that if he needs rehab she is in favor of our program, but is hopeful that he might not need it.  Also discussed case with MD.  Plan to follow up with the team Monday regarding medical readiness, therapy needs, and IP Rehab bed availability.  Call if questions.   Carmelia Roller., CCC/SLP Admission Coordinator  [place] [facility]  Cell [phone]

## 2017-07-25 NOTE — Plan of Care (Signed)
  Education: Knowledge of secondary prevention will improve 07/25/2017 0520 - Progressing by Janee Morn, RN   Education: Knowledge of patient specific risk factors addressed and post discharge goals established will improve 07/25/2017 0520 - Progressing by Janee Morn, RN

## 2017-07-25 NOTE — Progress Notes (Signed)
ANTICOAGULATION CONSULT NOTE - Follow Up Consult  Pharmacy Consult:  Heparin Indication:  Aflutter  Patient Measurements: Height: 5\' 10"  (177.8 cm) Weight: 216 lb 0.8 oz (98 kg) IBW/kg (Calculated) : 73  Heparin dosing weight: 94 kg  Vital Signs: Temp: 97.4 F (36.3 C) (01/25 0730) Temp Source: Oral (01/25 0730) BP: 122/73 (01/25 1030) Pulse Rate: 72 (01/25 1030)  Labs: Recent Labs    07/23/17 0539 07/24/17 0758 07/24/17 1021 07/24/17 2142 07/25/17 0649  HGB 10.9* 12.2*  --   --  10.8*  HCT 35.7* 36.2*  --   --  34.5*  PLT 483* 423*  --   --  376  LABPROT 22.9*  --  17.0*  --   --   INR 2.04  --  1.40  --   --   HEPARINUNFRC  --   --  <0.10* <0.10* <0.10*  CREATININE 8.64* 5.37*  --   --  7.15*    Estimated Creatinine Clearance: 15.3 mL/min (A) (by C-G formula based on SCr of 7.15 mg/dL (H)).    Assessment: 77 YOM who was on Coumadin PTA for history of AFib.  Now concerned with skin necrosis and patient needs a biopsy.  Pharmacy consulted to transition patient back to IV heparin.   Noted concern for possible new CVA vs septic emboli - will hold boluses and lower the goal to 0.3-0.5.  INR 1.4 s/p Vitamin K on 1/23. Heparin level remains undetectable after a rate increase yesterday evening. CBC low but stable - no bleeding noted at this time.   Per RN report - the drip was noted to be running at a secondary and not a primary and was adjusted at some point overnight. Also noted that a new bag was hung right around the time the lab was drawn. Will not be aggressive with the rate increase due to this.   Goal of Therapy:  Heparin level 0.3-0.5 units/ml Monitor platelets by anticoagulation protocol: Yes   Plan:  - Increase Heparin to 2150 units/hr (21.5 ml/hr) - Will follow-up plans for the biopsy and any oral AC plans - Will continue to monitor for any signs/symptoms of bleeding and will follow up with heparin level in 8 hours   Thank you for allowing pharmacy to be  a part of this patient's care.  Alycia Rossetti, PharmD, BCPS Clinical Pharmacist Pager: [phone] Clinical phone for 07/25/2017 from 7a-3:30p: [phone] If after 3:30p, please call main pharmacy at: [phone] 07/25/2017 10:44 AM

## 2017-07-25 NOTE — Progress Notes (Signed)
Skin Biopsy Procedure Note  Procedure: Punch biopsy  Pre-operative Diagnosis: Rash  Post-procedure Diagnosis: normal  Indications: rash of unknown etiology  Anesthesia: lidocaine 1%  Procedure Details  The procedure, risks and complications have been discussed in detail (including, but not limited to pain, infection, bleeding) with the patient, and the patient wishesto proceed withthe procedure. The skin was sterilely prepped over the affected area in the usual fashion. Using a 52mm punch 2 skin biopsies were taken from rash on the anterior left lower leg. Specimen placed in formalin specimen cup. Pressure held until hemostasis reached. Single suture was placed and dry dressing applied. The patient was observed until stable. There were no complications, and the patient tolerated the procedure well.  Plan: - Leave dressing in place x24 hours. Ok to Games developer. There is a single suture in place that will need to be removed in 5-7 days.  Hayden [name] A Keeley Sussman

## 2017-07-26 ENCOUNTER — Encounter (HOSPITAL_COMMUNITY): Payer: Self-pay | Admitting: Cardiology

## 2017-07-26 DIAGNOSIS — I350 Nonrheumatic aortic (valve) stenosis: Secondary | ICD-10-CM

## 2017-07-26 LAB — PROTIME-INR
INR: 1.29
PROTHROMBIN TIME: 16 s — AB (ref 11.4–15.2)

## 2017-07-26 LAB — BASIC METABOLIC PANEL
Anion gap: 14 (ref 5–15)
BUN: 26 mg/dL — AB (ref 6–20)
CALCIUM: 8.7 mg/dL — AB (ref 8.9–10.3)
CHLORIDE: 94 mmol/L — AB (ref 101–111)
CO2: 26 mmol/L (ref 22–32)
CREATININE: 4.91 mg/dL — AB (ref 0.61–1.24)
GFR calc non Af Amer: 13 mL/min — ABNORMAL LOW (ref >60)
GFR, EST AFRICAN AMERICAN: 15 mL/min — AB (ref >60)
Glucose, Bld: 92 mg/dL (ref 65–99)
Potassium: 3.7 mmol/L (ref 3.5–5.1)
SODIUM: 134 mmol/L — AB (ref 135–145)

## 2017-07-26 LAB — HEPARIN LEVEL (UNFRACTIONATED)
Heparin Unfractionated: 0.19 IU/mL — ABNORMAL LOW (ref 0.30–0.70)
Heparin Unfractionated: 0.59 IU/mL (ref 0.30–0.70)

## 2017-07-26 LAB — GLUCOSE, CAPILLARY
GLUCOSE-CAPILLARY: 97 mg/dL (ref 65–99)
Glucose-Capillary: 115 mg/dL — ABNORMAL HIGH (ref 65–99)
Glucose-Capillary: 115 mg/dL — ABNORMAL HIGH (ref 65–99)
Glucose-Capillary: 91 mg/dL (ref 65–99)

## 2017-07-26 LAB — CBC
HCT: 35.6 % — ABNORMAL LOW (ref 39.0–52.0)
HEMOGLOBIN: 11.1 g/dL — AB (ref 13.0–17.0)
MCH: 27.3 pg (ref 26.0–34.0)
MCHC: 31.2 g/dL (ref 30.0–36.0)
MCV: 87.5 fL (ref 78.0–100.0)
PLATELETS: 346 10*3/uL (ref 150–400)
RBC: 4.07 MIL/uL — ABNORMAL LOW (ref 4.22–5.81)
RDW: 19 % — AB (ref 11.5–15.5)
WBC: 8.6 10*3/uL (ref 4.0–10.5)

## 2017-07-26 LAB — VANCOMYCIN, RANDOM: Vancomycin Rm: 21

## 2017-07-26 MED ORDER — FERRIC CITRATE 1 GM 210 MG(FE) PO TABS
630.0000 mg | ORAL_TABLET | Freq: Three times a day (TID) | ORAL | Status: DC
  Administered 2017-07-26 – 2017-08-05 (×23): 630 mg via ORAL
  Filled 2017-07-26 (×33): qty 3

## 2017-07-26 MED ORDER — OXYCODONE HCL 5 MG PO TABS
15.0000 mg | ORAL_TABLET | Freq: Four times a day (QID) | ORAL | Status: DC | PRN
  Administered 2017-07-26 – 2017-07-28 (×6): 15 mg via ORAL
  Filled 2017-07-26 (×6): qty 3
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
## 2017-07-26 NOTE — Progress Notes (Addendum)
ANTICOAGULATION CONSULT NOTE - Follow Up Consult  Pharmacy Consult for heparin Indication: Aflutter  Labs: Recent Labs    07/24/17 0758 07/24/17 1021  07/25/17 0649 07/25/17 1921 07/26/17 0559  HGB 12.2*  --   --  10.8*  --  11.1*  HCT 36.2*  --   --  34.5*  --  35.6*  PLT 423*  --   --  376  --  346  LABPROT  --  17.0*  --  16.1*  --  16.0*  INR  --  1.40  --  1.31  --  1.29  HEPARINUNFRC  --  <0.10*   < > <0.10* <0.10* 0.19*  CREATININE 5.37*  --   --  7.15*  --  4.91*   < > = values in this interval not displayed.    Assessment: 47yo male remains sub therapeutic on heparin after rate increases.   Goal of Therapy:  Heparin level 0.3-0.5 units/ml   Plan:  Will increase heparin gtt by ~10% to 2600 units/hr and check level in 8 hours.   Wynona Neat, PharmD, BCPS  07/26/2017,7:12 AM

## 2017-07-26 NOTE — Progress Notes (Signed)
Pharmacy Antibiotic Note  Hayden [name] is a 47 y.o. male admitted on 07/19/2017 with cellulitis.  Pharmacy has been consulted for vancomycin and Zosyn dosing.  Patient has ESRD on HD MWF.  Vancomycin random level is therapeutic.  He is afebrile and his WBC is WNL.   Plan: Continue Vanc 1gm IV qHD MWF Continue Zosyn EID 3.375gm IV Q12H Monitor HD schedule/tolerance, clinical progress, PRN vanc trough F/U abx de-escalation, LOT plans  Consider d/c lasix in ESRD with no UOP charted - left MD SN   Height: 5\' 10"  (177.8 cm) Weight: 213 lb 10 oz (96.9 kg) IBW/kg (Calculated) : 73  Temp (24hrs), Avg:98 F (36.7 C), Min:96.9 F (36.1 C), Max:98.7 F (37.1 C)  Recent Labs  Lab 07/19/17 2032 07/19/17 2328 07/20/17 0013  07/22/17 0734 07/23/17 0539 07/24/17 0758 07/25/17 0649 07/26/17 0559  WBC  --   --   --    < > 9.0 8.4 10.1 7.6 8.6  CREATININE  --   --   --    < > 7.35* 8.64* 5.37* 7.15* 4.91*  LATICACIDVEN 0.92 0.96  --   --   --   --   --   --   --   VANCORANDOM  --   --  7  --   --   --   --   --  21   < > = values in this interval not displayed.    Estimated Creatinine Clearance: 22.2 mL/min (A) (by C-G formula based on SCr of 4.91 mg/dL (H)).    Allergies  Allergen Reactions  . Dilaudid [Hydromorphone Hcl] Other (See Comments)    ABNORMAL BEHAVIOR, "VERBALLY AND PHYSICALLY ABUSIVE," PSYCHOSIS  . Morphine And Related Itching  . Adhesive [Tape] Other (See Comments)    Redness from adhesive tape if left on too long, paper tape is preferred     Zosyn 1/20 >> 1/20, resume 1/21 >> Vanc 1/20 >> (1/30) * Doses: 1g on 1/20 in response to VR of 7, 1g (1/21 with HD, 1/23 no HD received this day, 1/25 with HD) * HD sessions: 1/21 (4 hr BFR 400), 1/24 (4 hr BFR 400), 1/25 (4 hr BFR 400)  1/26 VR = 21 mcg/mL >> no change  1/19 BCx - NGTD   Breena Bevacqua D. Mina Marble, PharmD, BCPS Pager:  [phone] 07/26/2017, 9:19 AM

## 2017-07-26 NOTE — Progress Notes (Deleted)
ANTICOAGULATION CONSULT NOTE - Follow Up Consult  Pharmacy Consult:  Heparin Indication:  Aflutter  Patient Measurements: Height: 5\' 10"  (177.8 cm) Weight: 213 lb 10 oz (96.9 kg) IBW/kg (Calculated) : 73  Heparin dosing weight: 94 kg  Vital Signs: Temp: 97.8 F (36.6 C) (01/26 1400) Temp Source: Oral (01/26 1400) BP: 122/63 (01/26 1400) Pulse Rate: 61 (01/26 1400)  Labs: Recent Labs    07/24/17 0758 07/24/17 1021  07/25/17 0649 07/25/17 1921 07/26/17 0559 07/26/17 1432  HGB 12.2*  --   --  10.8*  --  11.1*  --   HCT 36.2*  --   --  34.5*  --  35.6*  --   PLT 423*  --   --  376  --  346  --   LABPROT  --  17.0*  --  16.1*  --  16.0*  --   INR  --  1.40  --  1.31  --  1.29  --   HEPARINUNFRC  --  <0.10*   < > <0.10* <0.10* 0.19* 0.59  CREATININE 5.37*  --   --  7.15*  --  4.91*  --    < > = values in this interval not displayed.    Estimated Creatinine Clearance: 22.2 mL/min (A) (by C-G formula based on SCr of 4.91 mg/dL (H)).    Assessment: 60 YOM who was on Coumadin PTA for history of AFib.  Now concerned with skin necrosis and patient needs a biopsy.  Pharmacy consulted to transition patient back to IV heparin. Noted concern for possible new CVA vs septic emboli. -heparin level is 0.59 and above goal  Goal of Therapy:  Heparin level 0.3-0.5 units/ml Monitor platelets by anticoagulation protocol: Yes   Plan:  -Decrease heparin to 2500 units/hr -Heparin level daily wth CBC daily  Hildred Laser, Pharm D 07/26/2017 4:10 PM

## 2017-07-26 NOTE — Progress Notes (Signed)
ANTICOAGULATION CONSULT NOTE - Follow Up Consult  Pharmacy Consult:  Heparin Indication:  Aflutter  Patient Measurements: Height: 5\' 10"  (177.8 cm) Weight: 213 lb 10 oz (96.9 kg) IBW/kg (Calculated) : 73  Heparin dosing weight: 94 kg  Vital Signs: Temp: 97.8 F (36.6 C) (01/26 1400) Temp Source: Oral (01/26 1400) BP: 122/63 (01/26 1400) Pulse Rate: 61 (01/26 1400)  Labs: Recent Labs    07/24/17 0758 07/24/17 1021  07/25/17 0649 07/25/17 1921 07/26/17 0559 07/26/17 1432  HGB 12.2*  --   --  10.8*  --  11.1*  --   HCT 36.2*  --   --  34.5*  --  35.6*  --   PLT 423*  --   --  376  --  346  --   LABPROT  --  17.0*  --  16.1*  --  16.0*  --   INR  --  1.40  --  1.31  --  1.29  --   HEPARINUNFRC  --  <0.10*   < > <0.10* <0.10* 0.19* 0.59  CREATININE 5.37*  --   --  7.15*  --  4.91*  --    < > = values in this interval not displayed.    Estimated Creatinine Clearance: 22.2 mL/min (A) (by C-G formula based on SCr of 4.91 mg/dL (H)).   Assessment: 37 YOM who was on Coumadin PTA for history of AFib.  Now concerned with skin necrosis and patient needs a biopsy.  Pharmacy consulted to transition patient back to IV heparin.   Heparin level slightly supratherapeutic at 0.59. CBC stable, no bleeding noted.    Goal of Therapy:  Heparin level 0.3-0.5 units/ml (concern for new CVA vs. Septic emboli)  Monitor platelets by anticoagulation protocol: Yes   Plan:  - Decrease Heparin to 2550 units hour - Recheck heparin level in 8 hrs - Follow-up on oral AC plans - Continue to monitor for any signs/symptoms of bleeding   Leroy Libman, PharmD Pharmacy Resident Pager: [phone]

## 2017-07-26 NOTE — Progress Notes (Signed)
PROGRESS NOTE  Hayden [name]  NUU:[id number] DOB: [birth year] DOA: 07/19/2017 PCP: [name], DO   Brief Narrative: Hayden [name] is a 47 y.o. 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 XR showed calcified vessels without evidence of emphysema or fracture. Vancomycin and zosyn were given, nephrology consulted for HD while inpatient, and he was admitted. Fistulogram performed 1/22 showed widely patent AVF circuit. The patient grew more lethargic following the procedure. Neurology was consulted, sedating medications held. EEG showed nonspecific slowing. MRI showed multiple small supra- and infratentorial diffusion abnormalities most consistent with infarcts spanning multiple vascular territories.   Assessment & Plan: Cellulitis and abscess of left lower extremity: He has risk factors including h/o DM, PVD w/hx right AKA. U/S negative for DVT. Blood cultures negative.  - Erythema is stable, significant risk factors for complications. No abscess. Continue vanc/zosyn for now, plan to discuss with ID for ongoing recommendations at discharge.  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 - Stopped coumadin, started heparin, holding Ca. Phos binder per nephrology. - Pain control: Increase oxycodone now that he's more alert, monitor closely for sedation.   Acute metabolic encephalopathy: Overall significantly improving. With waxing/waning course, suspect delirium worsened by sedating medications and CVA. EEG 1/23 nonspecific slowing without epileptiform discharges. No seizure-like activity reported. Not hypercarbic. ?behavioral component.  -  Ammonia mildly elevated, empirically started lactulose, though will hold this given frequent BMs.  - Hold sedating medications as able.   CVA: Seen on motion-degraded MRI. Multiple vascular distributions involved consistent with cardioembolic/septic emboli. Echo showed no thrombus.  - Stroke team on board, further work up in progress. - Continue statin, permissive HTN - Carotid U/S pending.  - IV heparin  Acute systolic CHF with severe aortic stenosis, mitral stenosis: Echo with newly depressed LVEF to 35-40% from 60-65% with akinesis of mid-apicalanteroseptal myocardium. No wall motion abnormalities on last echo in Aug 2018. Also showed severe aortic stenosis, mitral stenosis. Pt has no chest pain. - Cardiology consult CTS on 1/28 for consideration of open repair and bypass vs. TAVR+PCI.   ESRD on hemodialysis with mild hyperkalemia and mild anemia - Nephrology providing HD on schedule.     Above knee amputation of right lower extremity  - PT evaluation, eventual dispo to CIR possibly.    Complete heart block: s/p PPM placement Nov 2018.  - No signs pacemaker is malfunctioning.      Atrial flutter: CHA2DS2-VASc Scoreis 5 - HR is well controlled, continue coreg.   - Heparin per pharmacy, stopped coumadin    Hyperlipidemia: - Continue statin.   Bipolar/depression:  - Continue depakote and lexapro since he's taking po.  Subtherapeutic INR: On arrival  DVT prophylaxis: IV heparin. Coumadin on hold. Code Status: Full Family Communication: Wife and son at the bedside Disposition Plan: CIR tentatively planned. SNF being arranged as a back up plan, though this is not preferred.   Consultants:   Nephrology  IR  Neurology  General surgery  Cardiology  Procedures:   HD 1/21, 1/23, 1/25  EEG 1/23:  The patient is poorly responsive during the recording.  During maximal wakefulness, there is a symmetric 7 Hz posterior dominant rhythm  that attenuates with eye opening. This  is admixed with diffuse 4-5 Hz theta and 2-3 Hz delta slowing of the waking background.  During drowsiness, there is an increase in theta slowing of the background.  Stage 2 sleep is not seen.  During the study, he exhibited occasional brief jerk of the extremities with no electrographic correlate.  There were no epileptiform discharges or electrographic seizures seen.   EKG lead was unremarkable.  Impression: This EEG is abnormal due to diffuse slowing of the waking background.  Echocardiogram 07/24/2017: - Left ventricle: The cavity size was normal. Wall thickness was   increased in a pattern of severe LVH. Systolic function was   moderately reduced. The estimated ejection fraction was in the   range of 35% to 40%. There is akinesis of the apical myocardium.   There is akinesis of the mid-apicalanteroseptal myocardium. - Aortic valve: Valve mobility was restricted. There was severe   stenosis. Valve area (VTI): 0.44 cm^2. Valve area (Vmax): 0.38   cm^2. Valve area (Vmean): 0.41 cm^2. - Mitral valve: Severely calcified annulus. The findings are   consistent with severe stenosis. Valve area by pressure   half-time: 1.84 cm^2. Valve area by continuity equation (using   LVOT flow): 0.95 cm^2. - Left atrium: The atrium was moderately dilated. - Right ventricle: The cavity size was moderately dilated. - Pulmonary arteries: PA peak pressure: 32 mm Hg (S).  Impressions: - Akinesis of the distal anteroseptal wall and apex with overall   moderate LV dysfunction; severe LVH; heavily calcified aortic   valve with severe AS (mean gradient 60 mmHg); severe MAC with   severe MS (mean gradient 12 mmHg); moderate LAE; moderate RVE;   mild TR.  Antimicrobials:  Vancomycin, Zosyn  Subjective: 75% back to his usual self per his wife. Pain not controlled in cellulitic area. Denies chest pain, dyspnea, palpitations.   Objective: Vitals:   07/25/17 1135 07/25/17 2036 07/26/17 0458 07/26/17 1400    BP: (!) 151/86 133/67 136/70 122/63  Pulse: 79 78 78 61  Resp: 18  18 18   Temp: (!) 96.9 F (36.1 C) 98.7 F (37.1 C) 98.3 F (36.8 C) 97.8 F (36.6 C)  TempSrc: Oral Oral Oral Oral  SpO2: 99% 94% 95% 94%  Weight: 96.9 kg (213 lb 10 oz)     Height:        Intake/Output Summary (Last 24 hours) at 07/26/2017 1708 Last data filed at 07/26/2017 1000 Gross per 24 hour  Intake 726.33 ml  Output -  Net 726.33 ml   Filed Weights   07/24/17 0600 07/25/17 0730 07/25/17 1135  Weight: 98.5 kg (217 lb 2.5 oz) 98 kg (216 lb 0.8 oz) 96.9 kg (213 lb 10 oz)   Gen: Chronically ill-appearing, grumpy but affable 47 y.o. male alert in HD Pulm: Non-labored breathing. Clear bilaterally CV: RRR, II/VI systolic murmur throughout. No JVD. GI: Abdomen soft, nondistended, +BS.   Ext: RLE upper femur amputation site stable, normal appearing. Left shin with sharply demarcated hyperpigmented/purpuric lesions with surrounding tender blanchable erythema and induration without palpable abscess. Developing blood blisters laterally. No purulence noted. (Pictures below) Skin: As above Neuro: Alert, oriented. Upper extremity ataxia noted bilaterally   Psych: Mood euthymic, affect congruent.     07/26/2017   07/25/2017   07/24/2017  CBC: Recent Labs  Lab 07/19/17 2018  07/22/17 0734 07/23/17 0539 07/24/17 0758 07/25/17 0649 07/26/17 0559  WBC 8.2   < > 9.0 8.4 10.1 7.6 8.6  NEUTROABS 5.4  --   --   --   --   --   --  HGB 11.2*   < > 10.5* 10.9* 12.2* 10.8* 11.1*  HCT 35.8*   < > 34.0* 35.7* 36.2* 34.5* 35.6*  MCV 88.4   < > 87.9 89.0 85.4 87.1 87.5  PLT 462*   < > 475* 483* 423* 376 346   < > = values in this interval not displayed.   Basic Metabolic Panel: Recent Labs  Lab 07/21/17 0900  07/22/17 0734 07/23/17 0539 07/23/17 1519 07/24/17 0758 07/25/17 0649 07/26/17 0559  NA 138   < > 137 135  --  136 137 134*  K 6.7*   < > 4.8 4.7  --  3.7 4.2 3.7  CL 98*   < > 94* 94*  --  94* 94*  94*  CO2 21*   < > 24 20*  --  25 24 26   GLUCOSE 91   < > 74 138*  --  131* 93 92  BUN 86*   < > 50* 68*  --  27* 45* 26*  CREATININE 9.93*   < > 7.35* 8.64*  --  5.37* 7.15* 4.91*  CALCIUM 8.7*   < > 9.5 8.6*  --  8.4* 8.6* 8.7*  PHOS 12.8*  --  10.2*  --  12.6*  --   --   --    < > = values in this interval not displayed.   Liver Function Tests: Recent Labs  Lab 07/19/17 2018 07/21/17 0900 07/22/17 0734  AST 14*  --   --   ALT 13*  --   --   ALKPHOS 173*  --   --   BILITOT 1.1  --   --   PROT 7.0  --   --   ALBUMIN 3.0* 2.9* 2.9*   Radiology Studies: No results found. Scheduled Meds: . amLODipine  10 mg Oral Daily  . carvedilol  25 mg Oral BID WC  . cinacalcet  60 mg Oral Q breakfast  . cloNIDine  0.2 mg Transdermal Q Tue  . divalproex  500 mg Oral Q12H  . escitalopram  10 mg Oral Daily  . ferric citrate  630 mg Oral TID WC  . gabapentin  100 mg Oral BID  . hydrALAZINE  100 mg Oral Q8H  . irbesartan  300 mg Oral Daily  . isosorbide dinitrate  10 mg Oral BID  . lactulose  10 g Oral BID  . multivitamin  1 tablet Oral QHS  . nicotine  21 mg Transdermal Daily  . pravastatin  40 mg Oral Daily   Continuous Infusions: . heparin 2,550 Units/hr (07/26/17 1617)  . piperacillin-tazobactam (ZOSYN)  IV Stopped (07/26/17 1218)  . vancomycin Stopped (07/25/17 1127)    LOS: 6 days   Time spent: 25 minutes.  Vance Gather, MD [name] Pager [phone]  If 7PM-7AM, please contact night-coverage [email or web address] Password [facility] 07/26/2017, 5:08 PM

## 2017-07-26 NOTE — Progress Notes (Signed)
[facility] Progress Note   Dialysis Orders: MWF NW 4h  450/800  98kg   2/2.25 bath  L AVF Hep 2400 - Hectoral 23mcg IV q HD - Venofer 100mg  x 10 ordered (4 given) - Mircera 210mcg IV q 2 weeks (last 1/18) -home BP Rx  norvasc 10, coreg 25 bid, clon patch 0.2 weekly, lasix 120 bid, hydral 100 tid, avapro 300  Assessment/Plan: 1 Cellulitis , +leg eschar 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 CVA cardioembolic/septic/ emboli - carotid US done- results pending; no thrombus on Echo 13. Tobacco abuse  Myriam Jacobson, PA-C [facility] [phone] 07/26/2017,8:31 AM  LOS: 6 days    Pt seen, examined and agree w A/P as above.  Kelly Splinter MD [employer] pager [phone]   07/26/2017, 1:17 PM     Subjective:   Multiple service complaints.  Many questions. Wife notes more swelling I "bruised area of LLE"  Objective Vitals:   07/25/17 1130 07/25/17 1135 07/25/17 2036 07/26/17 0458  BP: (!) 145/56 (!) 151/86 133/67 136/70  Pulse: 77 79 78 78  Resp: 18 18  18   Temp:  (!) 96.9 F (36.1 C) 98.7 F (37.1 C) 98.3 F (36.8 C)  TempSrc:  Oral Oral Oral  SpO2:  99% 94% 95%  Weight:   96.9 kg (213 lb 10 oz)    Height:       Physical Exam General: irritable NAD Heart: RRR Lungs: grossly clear Abdomen: obese soft NT Extremities: right AKA no edema LLE serpentine purplish discololration - Llateral side more swelling - likely dependent edema due to chronic positioning- tender -  Dialysis Access: left AVF + bruit   Additional Objective Labs: Basic Metabolic Panel: Recent Labs  Lab 07/21/17 0900  07/22/17 0734  07/23/17 1519 07/24/17 0758 07/25/17 0649 07/26/17 0559  NA 138   < > 137   < >  --  136 137 134*  K 6.7*   < > 4.8   < >  --  3.7 4.2 3.7  CL 98*   < > 94*   < >  --  94* 94* 94*  CO2 21*   < > 24   < >  --  25 24 26   GLUCOSE 91   < > 74   < >  --  131* 93 92  BUN 86*   < > 50*   < >  --  27* 45* 26*  CREATININE 9.93*   < > 7.35*   < >  --  5.37* 7.15* 4.91*  CALCIUM  8.7*   < > 9.5   < >  --  8.4* 8.6* 8.7*  PHOS 12.8*  --  10.2*  --  12.6*  --   --   --    < > = values in this interval not displayed.   Liver Function Tests: Recent Labs  Lab 07/19/17 2018 07/21/17 0900 07/22/17 0734  AST 14*  --   --   ALT 13*  --   --   ALKPHOS 173*  --   --   BILITOT 1.1  --   --   PROT 7.0  --   --   ALBUMIN 3.0* 2.9* 2.9*   No results for input(s): LIPASE, AMYLASE in the last 168 hours. CBC: Recent Labs  Lab 07/19/17 2018  07/22/17 0734 07/23/17 0539 07/24/17 0758 07/25/17 0649 07/26/17 0559  WBC 8.2   < > 9.0 8.4 10.1 7.6 8.6  NEUTROABS 5.4  --   --   --   --   --   --   HGB 11.2*   < > 10.5* 10.9* 12.2* 10.8* 11.1*  HCT 35.8*   < > 34.0* 35.7* 36.2* 34.5* 35.6*  MCV 88.4   < > 87.9 89.0 85.4 87.1 87.5  PLT 462*   < > 475* 483* 423* 376 346   < > = values in this interval not displayed.   Blood Culture    Component Value Date/Time   SDES BLOOD RIGHT HAND 07/19/2017 2330   SPECREQUEST  07/19/2017 2330    BOTTLES DRAWN AEROBIC ONLY Blood Culture adequate volume   CULT NO GROWTH 5 DAYS 07/19/2017 2330   REPTSTATUS 07/25/2017 FINAL  07/19/2017 2330    Cardiac Enzymes: No results for input(s): CKTOTAL, CKMB, CKMBINDEX, TROPONINI in the last 168 hours. CBG: Recent Labs  Lab 07/24/17 2101 07/25/17 0440 07/25/17 2032 07/26/17 0046 07/26/17 0643  GLUCAP 149* 109* 154* 115* 91   Iron Studies:  Recent Labs    07/24/17 0758  IRON 17*  TIBC 197*  FERRITIN 1,427*   Lab Results  Component Value Date   INR 1.29 07/26/2017   INR 1.31 07/25/2017   INR 1.40 07/24/2017   Studies/Results: No results found. Medications: . heparin 2,600 Units/hr (07/26/17 0706)  . piperacillin-tazobactam (ZOSYN)  IV 3.375 g (07/26/17 0818)  . vancomycin Stopped (07/25/17 1127)   . amLODipine  10 mg Oral Daily  . carvedilol  25 mg Oral BID WC  . cinacalcet  60 mg Oral Q breakfast  . cloNIDine  0.2 mg Transdermal Q Tue  . divalproex  500 mg Oral Q12H  . escitalopram  10 mg Oral Daily  . ferric citrate  420 mg Oral TID WC  . gabapentin  100 mg Oral BID  . hydrALAZINE  100 mg Oral Q8H  . irbesartan  300 mg Oral Daily  . isosorbide dinitrate  10 mg Oral BID  . lactulose  10 g Oral BID  . multivitamin  1 tablet Oral QHS  . nicotine  21 mg Transdermal Daily  . pravastatin  40 mg Oral Daily

## 2017-07-27 LAB — CBC
HEMATOCRIT: 36.9 % — AB (ref 39.0–52.0)
Hemoglobin: 11.3 g/dL — ABNORMAL LOW (ref 13.0–17.0)
MCH: 27.4 pg (ref 26.0–34.0)
MCHC: 30.6 g/dL (ref 30.0–36.0)
MCV: 89.6 fL (ref 78.0–100.0)
Platelets: 352 10*3/uL (ref 150–400)
RBC: 4.12 MIL/uL — ABNORMAL LOW (ref 4.22–5.81)
RDW: 19.3 % — AB (ref 11.5–15.5)
WBC: 9.8 10*3/uL (ref 4.0–10.5)

## 2017-07-27 LAB — PROTIME-INR
INR: 1.29
Prothrombin Time: 16 seconds — ABNORMAL HIGH (ref 11.4–15.2)

## 2017-07-27 LAB — GLUCOSE, CAPILLARY
Glucose-Capillary: 106 mg/dL — ABNORMAL HIGH (ref 65–99)
Glucose-Capillary: 101 mg/dL — ABNORMAL HIGH (ref 65–99)
Glucose-Capillary: 89 mg/dL (ref 65–99)
Glucose-Capillary: 152 mg/dL — ABNORMAL HIGH (ref 65–99)

## 2017-07-27 LAB — HEPARIN LEVEL (UNFRACTIONATED)
HEPARIN UNFRACTIONATED: 0.45 [IU]/mL (ref 0.30–0.70)
Heparin Unfractionated: 0.33 IU/mL (ref 0.30–0.70)

## 2017-07-27 MED ORDER — CINACALCET HCL 30 MG PO TABS
90.0000 mg | ORAL_TABLET | Freq: Every day | ORAL | Status: DC
  Administered 2017-07-28 – 2017-08-05 (×7): 90 mg via ORAL
  Filled 2017-07-27 (×7): qty 3

## 2017-07-27 NOTE — Progress Notes (Signed)
ANTICOAGULATION CONSULT NOTE - Follow Up Consult  Pharmacy Consult:  Heparin Indication:  Aflutter  Patient Measurements: Height: 5\' 10"  (177.8 cm) Weight: 213 lb 10 oz (96.9 kg) IBW/kg (Calculated) : 73  Heparin dosing weight: 94 kg  Vital Signs: Temp: 98.1 F (36.7 C) (01/27 0551) Temp Source: Axillary (01/27 0551) BP: 136/79 (01/27 0551) Pulse Rate: 69 (01/27 0551)  Labs: Recent Labs    07/25/17 0649  07/26/17 0559 07/26/17 1432 07/27/17 0001 07/27/17 0624  HGB 10.8*  --  11.1*  --   --  11.3*  HCT 34.5*  --  35.6*  --   --  36.9*  PLT 376  --  346  --   --  352  LABPROT 16.1*  --  16.0*  --   --  16.0*  INR 1.31  --  1.29  --   --  1.29  HEPARINUNFRC <0.10*   < > 0.19* 0.59 0.33 0.45  CREATININE 7.15*  --  4.91*  --   --   --    < > = values in this interval not displayed.    Estimated Creatinine Clearance: 22.2 mL/min (A) (by C-G formula based on SCr of 4.91 mg/dL (H)).   Assessment: Hayden [name] who was on Coumadin PTA for history of AFib.  Patient was transitioned to IV heparin for concern of Coumadin-induced skin necrosis vs calciphylaxis.  S/p skin biopsy on 07/25/17.  Heparin level is therpaeutic. CBC stable, no bleeding noted.    Goal of Therapy:  Heparin level 0.3-0.5 units/ml (concern for new CVA vs. septic emboli)  Monitor platelets by anticoagulation protocol: Yes    Plan:  Continue heparin gtt at 2550 units/hr (no bolus for possible infarct vs septic emboli) Daily heparin level and CBC   Pansy Ostrovsky D. Mina Marble, PharmD, BCPS Pager:  [phone] 07/27/2017, 9:03 AM

## 2017-07-27 NOTE — Progress Notes (Signed)
Progress Note  Patient Name: Hayden [name] Date of Encounter: 07/27/2017  Primary Cardiologist: Dr Ellyn Hack  Subjective   No chest pain or dyspnea  Inpatient Medications    Scheduled Meds: . amLODipine  10 mg Oral Daily  . carvedilol  25 mg Oral BID WC  . [START ON 07/28/2017] cinacalcet  90 mg Oral Q breakfast  . cloNIDine  0.2 mg Transdermal Q Tue  . divalproex  500 mg Oral Q12H  . escitalopram  10 mg Oral Daily  . ferric citrate  630 mg Oral TID WC  . gabapentin  100 mg Oral BID  . hydrALAZINE  100 mg Oral Q8H  . irbesartan  300 mg Oral Daily  . isosorbide dinitrate  10 mg Oral BID  . multivitamin  1 tablet Oral QHS  . nicotine  21 mg Transdermal Daily  . pravastatin  40 mg Oral Daily   Continuous Infusions: . heparin 2,550 Units/hr (07/27/17 0816)  . piperacillin-tazobactam (ZOSYN)  IV 3.375 g (07/27/17 0811)  . vancomycin Stopped (07/25/17 1127)   PRN Meds: nitroGLYCERIN, oxyCODONE, senna-docusate   Vital Signs    Vitals:   07/26/17 0458 07/26/17 1400 07/26/17 2023 07/27/17 0551  BP: 136/70 122/63 134/65 136/79  Pulse: 78 61 64 69  Resp: 18 18 18 18   Temp: 98.3 F (36.8 C) 97.8 F (36.6 C) 98.1 F (36.7 C) 98.1 F (36.7 C)  TempSrc: Oral Oral Oral Axillary  SpO2: 95% 94% 95% 91%  Weight:      Height:        Intake/Output Summary (Last 24 hours) at 07/27/2017 1033 Last data filed at 07/27/2017 0400 Gross per 24 hour  Intake 512.14 ml  Output -  Net 512.14 ml   Filed Weights   07/24/17 0600 07/25/17 0730 07/25/17 1135  Weight: 217 lb 2.5 oz (98.5 kg) 216 lb 0.8 oz (98 kg) 213 lb 10 oz (96.9 kg)    Physical Exam   GEN: No acute distress.   Neck: No JVD Cardiac: RRR, 3/6 systolic murmur Respiratory: Clear to auscultation bilaterally. GI: Soft, nontender, non-distended  MS: s/p right AKA, erythema LLE Neuro:  Nonfocal    Labs    Chemistry Recent Labs  Lab 07/21/17 0900  07/22/17 0734  07/24/17 0758 07/25/17 0649 07/26/17 0559    NA 138   < > 137   < > 136 137 134*  K 6.7*   < > 4.8   < > 3.7 4.2 3.7  CL 98*   < > 94*   < > 94* 94* 94*  CO2 21*   < > 24   < > 25 24 26   GLUCOSE 91   < > 74   < > 131* 93 92  BUN 86*   < > 50*   < > 27* 45* 26*  CREATININE 9.93*   < > 7.35*   < > 5.37* 7.15* 4.91*  CALCIUM 8.7*   < > 9.5   < > 8.4* 8.6* 8.7*  ALBUMIN 2.9*  --  2.9*  --   --   --   --   GFRNONAA 6*   < > 8*   < > 12* 8* 13*  GFRAA 6*   < > 9*   < > 14* 10* 15*  ANIONGAP 19*   < > 19*   < > 17* 19* 14   < > = values in this interval not displayed.     Hematology Recent Labs  Lab  07/25/17 0649 07/26/17 0559 07/27/17 0624  WBC 7.6 8.6 9.8  RBC 3.96* 4.07* 4.12*  HGB 10.8* 11.1* 11.3*  HCT 34.5* 35.6* 36.9*  MCV 87.1 87.5 89.6  MCH 27.3 27.3 27.4  MCHC 31.3 31.2 30.6  RDW 19.5* 19.0* 19.3*  PLT 376 346 352      Patient Profile     47 year old male with past medical history of diabetes mellitus, prior CVA, seizures, prior partial nephrectomy for kidney cancer, end-stage renal disease, prior right AKA, hypertension, hyperlipidemia, bipolar disorder, history of atrial flutter, prior pacemaker for evaluation of aortic stenosis and cardiomyopathy.  Admitted with lower extremity cellulitis.  Had decreased mental status transiently and MRI showed multiple small infarcts.  Echocardiogram was performed and showed ejection fraction 35-40% with akinesis of the apical myocardium, severe aortic stenosis with mean gradient 60 mmHg, severe mitral annular calcification with severe mitral stenosis with with mean gradient 12 mmHg, moderate left atrial enlargement and moderate right ventricular enlargement.    Assessment & Plan    1. Aortic stenosis/mitral stenosis-extremely difficult situation.  Echocardiogram shows newly reduced LV function with wall motion abnormality suggesting CAD.  Patient also has severe aortic stenosis and mitral stenosis.  He was seen by Dr. Ellyn Hack in clinic recently and cardiac CTA arranged with  thoughts of pursuing cardiac catheterization if abnormal.  It is not clear to me based on his multiple comorbidities (end-stage renal disease dialysis dependent, prior CVA, prior right AKA, recent cellulitis left lower extremity) that he would be a good candidate for aortic valve replacement, mitral valve replacement and coronary artery bypass and graft if indicated.  However he now states he would consider cardiac catheterization and valve surgery if he were a candidate.  I will have cardiothoracic surgery review for candidacy of open valve replacement and bypass tomorrow.  Alternatively could possibly consider TAVR and PCI if needed but would not address MV.  If they feel he is a candidate we will arrange cardiac catheterization.  If he is not a candidate for either then his prognosis will be very poor and would likely need palliative care consult. 2. History of atrial flutter-given CVA he will require long-term anticoagulation.  Continue heparin until results of skin biopsy known.  Continue carvedilol for rate control if atrial flutter recurs. 3. Prior pacemaker 4. Probable ischemic cardiomyopathy-continue beta-blocker.  Patient also on an ARB. 5. End-stage renal disease dialysis dependent-managed by nephrology. 6. Possible lower extremity cellulitis-antibiotics per primary service.  Skin biopsy results pending. 7. Tobacco abuse-patient counseled on discontinuing. 8. Hypertension-continue present blood pressure medications.     For questions or updates, please contact [place] Please consult [email or web address] for contact info under Cardiology/STEMI.      Signed, Kirk Ruths, MD  07/27/2017, 10:33 AM

## 2017-07-27 NOTE — Progress Notes (Signed)
PROGRESS NOTE  Hayden [name]  TDV:[id number] DOB: Nov 16, 1970 DOA: 07/19/2017 PCP: [name], DO   Brief Narrative: Hayden [name] is a 47 y.o. 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 XR showed calcified vessels without evidence of emphysema or fracture. Vancomycin and zosyn were given, nephrology consulted for HD while inpatient, and he was admitted. Fistulogram performed 1/22 showed widely patent AVF circuit. The patient grew more lethargic following the procedure. Neurology was consulted, sedating medications held. EEG showed nonspecific slowing. MRI showed multiple small supra- and infratentorial diffusion abnormalities most consistent with infarcts spanning multiple vascular territories.   Assessment & Plan: Cellulitis and abscess of left lower extremity: He has risk factors including h/o DM, PVD w/hx right AKA. U/S negative for DVT. Blood cultures negative.  - Erythema is stable, significant risk factors for complications. No abscess. Continue vanc/zosyn for now, plan to discuss with ID for ongoing recommendations at discharge.  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 - Holding coumadin, continue heparin, holding Ca. Phos binder per nephrology, planning recheck 1/28. - Pain control improved with augmented oxycodone, no plans to change at this time.   CVA: Seen on motion-degraded MRI. Multiple vascular distributions involved consistent with cardioembolic/septic emboli. Echo showed no thrombus. Carotid U/S limited by pt movement showed 1-39% stenosis of bilateral ICA's. 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  - Stroke  team following, no new recommendations at this time.  - Continue statin - IV heparin, will require ongoing anticoagulation.   Acute systolic CHF with severe aortic stenosis, mitral stenosis: Echo with newly depressed LVEF to 35-40% from 60-65% with akinesis of mid-apicalanteroseptal myocardium. No wall motion abnormalities on last echo in Aug 2018. Also showed severe aortic stenosis, mitral stenosis. Pt has no chest pain. - Cardiology to consult CTS on 1/28 for consideration of open repair and bypass vs. TAVR+PCI. Would proceed with LHC if felt to be an option. Palliative care only if not.   ESRD on hemodialysis with mild hyperkalemia and mild anemia - Nephrology providing HD on schedule.     Acute metabolic encephalopathy: Resolving. With waxing/waning course, suspect delirium worsened by sedating medications and CVA. EEG 1/23 nonspecific slowing without epileptiform discharges.  - Ammonia mildly elevated, gave lactulose, though had significant diarrhea and mental status improved. Holding for now.  - Hold sedating medications as able.   Above knee amputation of right lower extremity  - PT evaluation, eventual dispo to CIR possibly.    Complete heart block: s/p PPM placement Nov 2018.  - No signs pacemaker is malfunctioning.      Atrial flutter: CHA2DS2-VASc Scoreis 5 - HR is well controlled, continue coreg.   - Heparin per pharmacy, stopped coumadin    Hyperlipidemia: - Continue statin.   Bipolar/depression:  - Continue depakote and lexapro since he's taking po.  Subtherapeutic INR: On arrival  DVT prophylaxis: IV heparin. Coumadin on hold. Code Status: Full Family Communication: Wife at the bedside Disposition Plan: CIR tentatively planned. SNF being arranged as a back up plan, though this is not preferred.   Consultants:   Nephrology  IR  Neurology  General surgery  Cardiology  Procedures:   HD  1/21, 1/23, 1/25  EEG 1/23:  The patient is poorly responsive  during the recording.  During maximal wakefulness, there is a symmetric 7 Hz posterior dominant rhythm that attenuates with eye opening. This is admixed with diffuse 4-5 Hz theta and 2-3 Hz delta slowing of the waking background.  During drowsiness, there is an increase in theta slowing of the background.  Stage 2 sleep is not seen.  During the study, he exhibited occasional brief jerk of the extremities with no electrographic correlate.  There were no epileptiform discharges or electrographic seizures seen.   EKG lead was unremarkable.  Impression: This EEG is abnormal due to diffuse slowing of the waking background.  Echocardiogram 07/24/2017: - Left ventricle: The cavity size was normal. Wall thickness was   increased in a pattern of severe LVH. Systolic function was   moderately reduced. The estimated ejection fraction was in the   range of 35% to 40%. There is akinesis of the apical myocardium.   There is akinesis of the mid-apicalanteroseptal myocardium. - Aortic valve: Valve mobility was restricted. There was severe   stenosis. Valve area (VTI): 0.44 cm^2. Valve area (Vmax): 0.38   cm^2. Valve area (Vmean): 0.41 cm^2. - Mitral valve: Severely calcified annulus. The findings are   consistent with severe stenosis. Valve area by pressure   half-time: 1.84 cm^2. Valve area by continuity equation (using   LVOT flow): 0.95 cm^2. - Left atrium: The atrium was moderately dilated. - Right ventricle: The cavity size was moderately dilated. - Pulmonary arteries: PA peak pressure: 32 mm Hg (S).  Impressions: - Akinesis of the distal anteroseptal wall and apex with overall   moderate LV dysfunction; severe LVH; heavily calcified aortic   valve with severe AS (mean gradient 60 mmHg); severe MAC with   severe MS (mean gradient 12 mmHg); moderate LAE; moderate RVE;   mild TR.  Antimicrobials:  Vancomycin, Zosyn  Subjective: Confusion continues to clear. Pain better controlled. No new  complaints.    Objective: Vitals:   07/26/17 1400 07/26/17 2023 07/27/17 0551 07/27/17 1347  BP: 122/63 134/65 136/79 (!) 114/59  Pulse: 61 64 69 73  Resp: 18 18 18 18   Temp: 97.8 F (36.6 C) 98.1 F (36.7 C) 98.1 F (36.7 C) 98.8 F (37.1 C)  TempSrc: Oral Oral Axillary Oral  SpO2: 94% 95% 91% 93%  Weight:      Height:        Intake/Output Summary (Last 24 hours) at 07/27/2017 1434 Last data filed at 07/27/2017 1100 Gross per 24 hour  Intake 740.64 ml  Output -  Net 740.64 ml   Filed Weights   07/24/17 0600 07/25/17 0730 07/25/17 1135  Weight: 98.5 kg (217 lb 2.5 oz) 98 kg (216 lb 0.8 oz) 96.9 kg (213 lb 10 oz)   Gen: Chronically ill-appearing, grumpy but affable 47 y.o. male alert in HD Pulm: Non-labored breathing. Clear bilaterally CV: RRR, II/VI systolic murmur throughout. No JVD. GI: Abdomen soft, nondistended, +BS.   Ext: RLE upper femur amputation site stable, normal appearing. Left shin with sharply demarcated hyperpigmented/purpuric lesions with surrounding tender blanchable erythema and induration without palpable abscess. Developing blood blisters laterally. No purulence noted. (Pictures below) Overall stable today from yesterday.  Skin: As above Neuro: Alert, oriented. Upper extremity ataxia noted bilaterally   Psych: Mood euthymic, affect congruent.     07/26/2017   07/25/2017   07/24/2017  CBC: Recent Labs  Lab 07/23/17 6948 07/24/17 5462 07/25/17 7035 07/26/17 0559 07/27/17 0093  WBC 8.4 10.1 7.6 8.6 9.8  HGB 10.9* 12.2* 10.8* 11.1* 11.3*  HCT 35.7* 36.2* 34.5* 35.6* 36.9*  MCV 89.0 85.4 87.1 87.5 89.6  PLT 483* 423* 376 346 814   Basic Metabolic Panel: Recent Labs  Lab 07/21/17 0900  07/22/17 0734 07/23/17 0539 07/23/17 1519 07/24/17 0758 07/25/17 0649 07/26/17 0559  NA 138   < > 137 135  --  136 137 134*  K 6.7*   < > 4.8 4.7  --  3.7 4.2 3.7  CL 98*   < > 94* 94*  --  94* 94* 94*  CO2 21*   < > 24 20*  --  25 24 26   GLUCOSE 91    < > 74 138*  --  131* 93 92  BUN 86*   < > 50* 68*  --  27* 45* 26*  CREATININE 9.93*   < > 7.35* 8.64*  --  5.37* 7.15* 4.91*  CALCIUM 8.7*   < > 9.5 8.6*  --  8.4* 8.6* 8.7*  PHOS 12.8*  --  10.2*  --  12.6*  --   --   --    < > = values in this interval not displayed.   Liver Function Tests: Recent Labs  Lab 07/21/17 0900 07/22/17 0734  ALBUMIN 2.9* 2.9*   Radiology Studies: No results found. Scheduled Meds: . amLODipine  10 mg Oral Daily  . carvedilol  25 mg Oral BID WC  . [START ON 07/28/2017] cinacalcet  90 mg Oral Q breakfast  . cloNIDine  0.2 mg Transdermal Q Tue  . divalproex  500 mg Oral Q12H  . escitalopram  10 mg Oral Daily  . ferric citrate  630 mg Oral TID WC  . gabapentin  100 mg Oral BID  . hydrALAZINE  100 mg Oral Q8H  . irbesartan  300 mg Oral Daily  . isosorbide dinitrate  10 mg Oral BID  . multivitamin  1 tablet Oral QHS  . nicotine  21 mg Transdermal Daily  . pravastatin  40 mg Oral Daily   Continuous Infusions: . heparin 2,550 Units/hr (07/27/17 0816)  . piperacillin-tazobactam (ZOSYN)  IV Stopped (07/27/17 1242)  . vancomycin Stopped (07/25/17 1127)    LOS: 7 days   Time spent: 25 minutes.  Vance Gather, MD [name] Pager [phone]  If 7PM-7AM, please contact night-coverage [email or web address] Password [facility] 07/27/2017, 2:34 PM

## 2017-07-27 NOTE — Progress Notes (Signed)
ANTICOAGULATION CONSULT NOTE - Follow Up Consult  Pharmacy Consult for heparin Indication: Aflutter w/ concern for new CVA vs septic emboli  Labs: Recent Labs    07/24/17 0758 07/24/17 1021  07/25/17 0649  07/26/17 0559 07/26/17 1432 07/27/17 0001  HGB 12.2*  --   --  10.8*  --  11.1*  --   --   HCT 36.2*  --   --  34.5*  --  35.6*  --   --   PLT 423*  --   --  376  --  346  --   --   LABPROT  --  17.0*  --  16.1*  --  16.0*  --   --   INR  --  1.40  --  1.31  --  1.29  --   --   HEPARINUNFRC  --  <0.10*   < > <0.10*   < > 0.19* 0.59 0.33  CREATININE 5.37*  --   --  7.15*  --  4.91*  --   --    < > = values in this interval not displayed.    Assessment/Plan:  47yo male therapeutic on heparin after rate change. Will continue gtt at current rate and confirm stable with additional level.   Wynona Neat, PharmD, BCPS  07/27/2017,12:55 AM

## 2017-07-27 NOTE — Progress Notes (Signed)
[facility] Progress Note  Dialysis Orders: MWF NW 4h 450/800 98kg 2/2.25 bath L AVF Hep 2400 - Hectoral 10mcg IV q HD - Venofer 100mg  x 10 ordered (4 given) - Mircera 247mcg IV q 2 weeks (last 1/18) -home BP Rx norvasc 10, coreg 25 bid, clon patch 0.2 weekly, lasix 120 bid, hydral 100 tid, avapro 300  Assessment/Plan: 1 Cellulitis , +leg eschar 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 CVA cardioembolic/?septic/ emboli - carotid US done- results pending; no thrombus on Echo 13. Tobacco abuse 25. CM/CHF/MS/AS- cards evaluating -to have CT surgery eval Monday   Myriam Jacobson, PA-C [place] [phone] 07/27/2017,8:04 AM  LOS: 7 days   Pt seen, examined and agree w A/P as above.  Kelly Splinter MD [facility] pager [phone]   07/27/2017, 1:55 PM    Subjective:   LLE feels less tight.  Cardiology gave him some bad news but he appreciates the honesty. Bowels sluggish but doesn't want a laxative.  Objective Vitals:   07/26/17 0458 07/26/17 1400 07/26/17 2023  07/27/17 0551  BP: 136/70 122/63 134/65 136/79  Pulse: 78 61 64 69  Resp: 18 18 18 18   Temp: 98.3 F (36.8 C) 97.8 F (36.6 C) 98.1 F (36.7 C) 98.1 F (36.7 C)  TempSrc: Oral Oral Oral Axillary  SpO2: 95% 94% 95% 91%  Weight:      Height:       Physical Exam General: NAD in bed breathing easily on room air Heart: RRR 3/6 murmur Lungs: no alres Abdomen: obese soft NT Extremities: right AKA no sig edema; left LE - tr edema mod erythema/dark areas /dressing in place Dialysis Access: left lower AVF + bruit   Additional Objective Labs: Basic Metabolic Panel: Recent Labs  Lab 07/21/17 0900  07/22/17 0734  07/23/17 1519 07/24/17 0758 07/25/17 0649 07/26/17 0559  NA 138   < > 137   < >  --  136 137 134*  K 6.7*   < > 4.8   < >  --  3.7 4.2 3.7  CL 98*   < > 94*   < >  --  94* 94* 94*  CO2 21*   < > 24   < >  --  25 24 26   GLUCOSE 91   < > 74   < >  --  131* 93 92  BUN 86*   < > 50*   < >  --  27*  45* 26*  CREATININE 9.93*   < > 7.35*   < >  --  5.37* 7.15* 4.91*  CALCIUM 8.7*   < > 9.5   < >  --  8.4* 8.6* 8.7*  PHOS 12.8*  --  10.2*  --  12.6*  --   --   --    < > = values in this interval not displayed.   Liver Function Tests: Recent Labs  Lab 07/21/17 0900 07/22/17 0734  ALBUMIN 2.9* 2.9*   No results for input(s): LIPASE, AMYLASE in the last 168 hours. CBC: Recent Labs  Lab 07/23/17 0539 07/24/17 0758 07/25/17 0649 07/26/17 0559 07/27/17 0624  WBC 8.4 10.1 7.6 8.6 9.8  HGB 10.9* 12.2* 10.8* 11.1* 11.3*  HCT 35.7* 36.2* 34.5* 35.6* 36.9*  MCV 89.0 85.4 87.1 87.5 89.6  PLT 483* 423* 376 346 352   Blood Culture    Component Value Date/Time   SDES BLOOD RIGHT HAND 07/19/2017 2330   SPECREQUEST  07/19/2017 2330    BOTTLES DRAWN AEROBIC ONLY Blood Culture adequate volume   CULT NO GROWTH 5 DAYS 07/19/2017 2330   REPTSTATUS 07/25/2017 FINAL 07/19/2017 2330    Cardiac Enzymes: No results for input(s): CKTOTAL, CKMB, CKMBINDEX, TROPONINI in the last  168 hours. CBG: Recent Labs  Lab 07/26/17 0046 07/26/17 0643 07/26/17 1120 07/26/17 2027 07/27/17 0652  GLUCAP 115* 91 97 115* 106*   Iron Studies: No results for input(s): IRON, TIBC, TRANSFERRIN, FERRITIN in the last 72 hours. Lab Results  Component Value Date   INR 1.29 07/26/2017   INR 1.31 07/25/2017   INR 1.40 07/24/2017   Studies/Results: No results found. Medications: . heparin 2,550 Units/hr (07/26/17 1952)  . piperacillin-tazobactam (ZOSYN)  IV 3.375 g (07/26/17 2009)  . vancomycin Stopped (07/25/17 1127)   . amLODipine  10 mg Oral Daily  . carvedilol  25 mg Oral BID WC  . cinacalcet  60 mg Oral Q breakfast  . cloNIDine  0.2 mg Transdermal Q Tue  . divalproex  500 mg Oral Q12H  . escitalopram  10 mg Oral Daily  . ferric citrate  630 mg Oral TID WC  . gabapentin  100 mg Oral BID  . hydrALAZINE  100 mg Oral Q8H  . irbesartan  300 mg Oral Daily  . isosorbide dinitrate  10 mg Oral BID  . multivitamin  1 tablet Oral QHS  . nicotine  21 mg Transdermal Daily  . pravastatin  40 mg Oral Daily

## 2017-07-28 ENCOUNTER — Encounter (HOSPITAL_COMMUNITY): Payer: Self-pay | Admitting: Physician Assistant

## 2017-07-28 DIAGNOSIS — Z833 Family history of diabetes mellitus: Secondary | ICD-10-CM

## 2017-07-28 DIAGNOSIS — Z992 Dependence on renal dialysis: Secondary | ICD-10-CM

## 2017-07-28 DIAGNOSIS — Z885 Allergy status to narcotic agent status: Secondary | ICD-10-CM

## 2017-07-28 DIAGNOSIS — I350 Nonrheumatic aortic (valve) stenosis: Secondary | ICD-10-CM

## 2017-07-28 DIAGNOSIS — Z950 Presence of cardiac pacemaker: Secondary | ICD-10-CM

## 2017-07-28 DIAGNOSIS — Z8249 Family history of ischemic heart disease and other diseases of the circulatory system: Secondary | ICD-10-CM

## 2017-07-28 DIAGNOSIS — M79605 Pain in left leg: Secondary | ICD-10-CM

## 2017-07-28 DIAGNOSIS — I5032 Chronic diastolic (congestive) heart failure: Secondary | ICD-10-CM

## 2017-07-28 DIAGNOSIS — I132 Hypertensive heart and chronic kidney disease with heart failure and with stage 5 chronic kidney disease, or end stage renal disease: Secondary | ICD-10-CM

## 2017-07-28 DIAGNOSIS — Z905 Acquired absence of kidney: Secondary | ICD-10-CM

## 2017-07-28 DIAGNOSIS — E785 Hyperlipidemia, unspecified: Secondary | ICD-10-CM

## 2017-07-28 DIAGNOSIS — Z89611 Acquired absence of right leg above knee: Secondary | ICD-10-CM

## 2017-07-28 DIAGNOSIS — F17210 Nicotine dependence, cigarettes, uncomplicated: Secondary | ICD-10-CM

## 2017-07-28 DIAGNOSIS — G40909 Epilepsy, unspecified, not intractable, without status epilepticus: Secondary | ICD-10-CM

## 2017-07-28 DIAGNOSIS — Z8673 Personal history of transient ischemic attack (TIA), and cerebral infarction without residual deficits: Secondary | ICD-10-CM

## 2017-07-28 DIAGNOSIS — Z8679 Personal history of other diseases of the circulatory system: Secondary | ICD-10-CM

## 2017-07-28 DIAGNOSIS — N186 End stage renal disease: Secondary | ICD-10-CM

## 2017-07-28 DIAGNOSIS — Z91048 Other nonmedicinal substance allergy status: Secondary | ICD-10-CM

## 2017-07-28 DIAGNOSIS — Z85528 Personal history of other malignant neoplasm of kidney: Secondary | ICD-10-CM

## 2017-07-28 LAB — RENAL FUNCTION PANEL
ALBUMIN: 2.4 g/dL — AB (ref 3.5–5.0)
Anion gap: 17 — ABNORMAL HIGH (ref 5–15)
BUN: 50 mg/dL — ABNORMAL HIGH (ref 6–20)
CO2: 25 mmol/L (ref 22–32)
Calcium: 8.4 mg/dL — ABNORMAL LOW (ref 8.9–10.3)
Chloride: 93 mmol/L — ABNORMAL LOW (ref 101–111)
Creatinine, Ser: 8.02 mg/dL — ABNORMAL HIGH (ref 0.61–1.24)
GFR calc non Af Amer: 7 mL/min — ABNORMAL LOW (ref >60)
GFR, EST AFRICAN AMERICAN: 8 mL/min — AB (ref >60)
Glucose, Bld: 99 mg/dL (ref 65–99)
PHOSPHORUS: 7.8 mg/dL — AB (ref 2.5–4.6)
Potassium: 4.6 mmol/L (ref 3.5–5.1)
SODIUM: 135 mmol/L (ref 135–145)

## 2017-07-28 LAB — CBC
HCT: 36.5 % — ABNORMAL LOW (ref 39.0–52.0)
HEMOGLOBIN: 11.2 g/dL — AB (ref 13.0–17.0)
MCH: 27.4 pg (ref 26.0–34.0)
MCHC: 30.7 g/dL (ref 30.0–36.0)
MCV: 89.2 fL (ref 78.0–100.0)
Platelets: 330 10*3/uL (ref 150–400)
RBC: 4.09 MIL/uL — AB (ref 4.22–5.81)
RDW: 19 % — ABNORMAL HIGH (ref 11.5–15.5)
WBC: 10.6 10*3/uL — ABNORMAL HIGH (ref 4.0–10.5)

## 2017-07-28 LAB — GLUCOSE, CAPILLARY
GLUCOSE-CAPILLARY: 126 mg/dL — AB (ref 65–99)
Glucose-Capillary: 90 mg/dL (ref 65–99)
Glucose-Capillary: 129 mg/dL — ABNORMAL HIGH (ref 65–99)
Glucose-Capillary: 124 mg/dL — ABNORMAL HIGH (ref 65–99)

## 2017-07-28 LAB — PROTIME-INR
INR: 1.29
Prothrombin Time: 16 seconds — ABNORMAL HIGH (ref 11.4–15.2)

## 2017-07-28 LAB — HEPARIN LEVEL (UNFRACTIONATED)
HEPARIN UNFRACTIONATED: 0.39 [IU]/mL (ref 0.30–0.70)
Heparin Unfractionated: 0.79 IU/mL — ABNORMAL HIGH (ref 0.30–0.70)

## 2017-07-28 MED ORDER — OXYCODONE HCL 5 MG PO TABS
10.0000 mg | ORAL_TABLET | Freq: Four times a day (QID) | ORAL | Status: DC | PRN
  Administered 2017-07-28 – 2017-08-05 (×19): 10 mg via ORAL
  Filled 2017-07-28 (×19): qty 2

## 2017-07-28 MED ORDER — OXYCODONE HCL 5 MG PO TABS
ORAL_TABLET | ORAL | Status: AC
  Filled 2017-07-28: qty 3

## 2017-07-28 NOTE — Progress Notes (Signed)
Progress Note  Patient Name: Hayden [name] Date of Encounter: 07/28/2017  Primary Cardiologist: Dr Ellyn Hack  Subjective   Lethargic; denies CP or dyspnea  Inpatient Medications    Scheduled Meds: . amLODipine  10 mg Oral Daily  . carvedilol  25 mg Oral BID WC  . cinacalcet  90 mg Oral Q breakfast  . cloNIDine  0.2 mg Transdermal Q Tue  . divalproex  500 mg Oral Q12H  . escitalopram  10 mg Oral Daily  . ferric citrate  630 mg Oral TID WC  . gabapentin  100 mg Oral BID  . hydrALAZINE  100 mg Oral Q8H  . irbesartan  300 mg Oral Daily  . isosorbide dinitrate  10 mg Oral BID  . multivitamin  1 tablet Oral QHS  . nicotine  21 mg Transdermal Daily  . pravastatin  40 mg Oral Daily   Continuous Infusions: . heparin 2,550 Units/hr (07/28/17 0351)  . piperacillin-tazobactam (ZOSYN)  IV 3.375 g (07/28/17 0845)  . vancomycin Stopped (07/25/17 1127)   PRN Meds: nitroGLYCERIN, oxyCODONE, senna-docusate   Vital Signs    Vitals:   07/27/17 0551 07/27/17 1347 07/27/17 2127 07/28/17 0415  BP: 136/79 (!) 114/59 (!) 124/56 128/66  Pulse: 69 73 64 68  Resp: 18 18 16 17   Temp: 98.1 F (36.7 C) 98.8 F (37.1 C) 98.6 F (37 C) 98.3 F (36.8 C)  TempSrc: Axillary Oral Oral Oral  SpO2: 91% 93% 96% 98%  Weight:      Height:        Intake/Output Summary (Last 24 hours) at 07/28/2017 0957 Last data filed at 07/28/2017 0700 Gross per 24 hour  Intake 956 ml  Output -  Net 956 ml   Filed Weights   07/24/17 0600 07/25/17 0730 07/25/17 1135  Weight: 217 lb 2.5 oz (98.5 kg) 216 lb 0.8 oz (98 kg) 213 lb 10 oz (96.9 kg)    Physical Exam   GEN: WD, chronically ill appearing; NAD Neck: supple Cardiac: RRR, 3/6 systolic murmur; no DM Respiratory: Clear to auscultation bilaterally; no wheeze. GI: Soft, nontender, non-distended, no masses MS: s/p right AKA, erythema LLE; no edema Neuro:  lethargic; answers questions   Labs    Chemistry Recent Labs  Lab 07/22/17 0734   07/25/17 0649 07/26/17 0559 07/28/17 0705  NA 137   < > 137 134* 135  K 4.8   < > 4.2 3.7 4.6  CL 94*   < > 94* 94* 93*  CO2 24   < > 24 26 25   GLUCOSE 74   < > 93 92 99  BUN 50*   < > 45* 26* 50*  CREATININE 7.35*   < > 7.15* 4.91* 8.02*  CALCIUM 9.5   < > 8.6* 8.7* 8.4*  ALBUMIN 2.9*  --   --   --  2.4*  GFRNONAA 8*   < > 8* 13* 7*  GFRAA 9*   < > 10* 15* 8*  ANIONGAP 19*   < > 19* 14 17*   < > = values in this interval not displayed.     Hematology Recent Labs  Lab 07/26/17 0559 07/27/17 0624 07/28/17 0705  WBC 8.6 9.8 10.6*  RBC 4.07* 4.12* 4.09*  HGB 11.1* 11.3* 11.2*  HCT 35.6* 36.9* 36.5*  MCV 87.5 89.6 89.2  MCH 27.3 27.4 27.4  MCHC 31.2 30.6 30.7  RDW 19.0* 19.3* 19.0*  PLT 346 352 330      Patient Profile  47 year old male with past medical history of diabetes mellitus, prior CVA, seizures, prior partial nephrectomy for kidney cancer, end-stage renal disease, prior right AKA, hypertension, hyperlipidemia, bipolar disorder, history of atrial flutter, prior pacemaker for evaluation of aortic stenosis and cardiomyopathy.  Admitted with lower extremity cellulitis.  Had decreased mental status transiently and MRI showed multiple small infarcts.  Echocardiogram was performed and showed ejection fraction 35-40% with akinesis of the apical myocardium, severe aortic stenosis with mean gradient 60 mmHg, severe mitral annular calcification with severe mitral stenosis with with mean gradient 12 mmHg, moderate left atrial enlargement and moderate right ventricular enlargement.    Assessment & Plan    1. Aortic stenosis/mitral stenosis-extremely difficult situation.  Echocardiogram shows newly reduced LV function with wall motion abnormality suggesting CAD (vs apical abnormality due to pacing).  Patient also has severe aortic stenosis and mitral stenosis.  It is not clear to me based on his multiple comorbidities (end-stage renal disease dialysis dependent, prior CVA,  prior right AKA, recent cellulitis left lower extremity) that he would be a good candidate for aortic valve replacement, mitral valve replacement and coronary artery bypass and graft if indicated.  However he would like all measures if possible.  I have discussed the patient with Dr. Burt Knack.  They will evaluate.  Options appear to be 1.cardiac catheterization followed by aortic valve/mitral valve replacement and CABG if needed 2.  Cardiac catheterization with PCI as indicated and TAVR (would not address MV) 3 if he is not a candidate for either then hospice evaluation would need to be considered given severity of his valvular heart disease.   2. History of atrial flutter-given CVA he will require long-term anticoagulation.  Continue heparin until results of skin biopsy known.  Continue carvedilol for rate control if atrial flutter recurs. 3. Prior pacemaker 4. Probable ischemic cardiomyopathy-continue beta-blocker.  Patient also on an ARB. 5. End-stage renal disease dialysis dependent-managed by nephrology. 6. Possible lower extremity cellulitis-antibiotics per primary service.  Skin biopsy results pending (question calciphylaxis). 7. Tobacco abuse-patient counseled on discontinuing. 8. Hypertension-continue present blood pressure medications.     For questions or updates, please contact [place] Please consult [email or web address] for contact info under Cardiology/STEMI.      Signed, Kirk Ruths, MD  07/28/2017, 9:57 AM

## 2017-07-28 NOTE — Progress Notes (Addendum)
ANTICOAGULATION CONSULT NOTE - Follow Up Consult  Pharmacy Consult:  Heparin Indication:  Aflutter  Patient Measurements: Height: 5\' 10"  (177.8 cm) Weight: 213 lb 10 oz (96.9 kg) IBW/kg (Calculated) : 73  Heparin dosing weight: 94 kg  Vital Signs: Temp: 98.3 F (36.8 C) (01/28 0415) Temp Source: Oral (01/28 0415) BP: 128/66 (01/28 0415) Pulse Rate: 68 (01/28 0415)  Labs: Recent Labs    07/26/17 0559  07/27/17 0001 07/27/17 0624 07/28/17 0705  HGB 11.1*  --   --  11.3* 11.2*  HCT 35.6*  --   --  36.9* 36.5*  PLT 346  --   --  352 330  LABPROT 16.0*  --   --  16.0* 16.0*  INR 1.29  --   --  1.29 1.29  HEPARINUNFRC 0.19*   < > 0.33 0.45 0.79*  CREATININE 4.91*  --   --   --  8.02*   < > = values in this interval not displayed.    Estimated Creatinine Clearance: 13.6 mL/min (A) (by C-G formula based on SCr of 8.02 mg/dL (H)).   Assessment: 84 YOM who was on Coumadin PTA for history of AFib.  Patient was transitioned to IV heparin for concern of Coumadin-induced skin necrosis vs calciphylaxis. Negative for DVT. S/p skin biopsy on 07/25/17.  Heparin level is now supratherapeutic at 0.79 - per RN, believes this level was drawn incorrectly and will get stat repeat level. CBC stable, no bleeding noted.    Goal of Therapy:  Heparin level 0.3-0.5 units/ml (concern for new CVA vs. septic emboli)  Monitor platelets by anticoagulation protocol: Yes    Plan:  Continue heparin gtt at 2550 units/hr for now (no bolus for possible infarct vs septic emboli) Stat repeat heparin level Monitor daily heparin level and CBC, for s/sx bleeding   Elicia Lamp, PharmD, BCPS Clinical Pharmacist Clinical phone for 07/28/2017 until 3:30pm: [phone] If after 3:30pm, please call main pharmacy at: [phone] 07/28/2017 9:22 AM   ADDENDUM: Stat repeat heparin level therapeutic. No bleed issues documented.  Plan: Continue heparin at 2550 units/hr Monitor daily heparin level/CBC, s/sx  bleeding  Elicia Lamp, PharmD, BCPS Clinical Pharmacist 07/28/2017 12:38 PM

## 2017-07-28 NOTE — Progress Notes (Signed)
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 Pt presented to the ED 1/19 with left lower extremity redness and pain. Pt became encephalopic and MRI on 1/23 revealed multiple small supra-and infratentorial diffusion abnormalities most consistent with infarcts spanning multiple vascular territories.    PT Comments    Pt progressing towards physical therapy goals. At beginning of session pt lethargic and required lights on, loud voice and gentle shaking at the arm to wake up enough to participate. Pt appeared confused however was participatory throughout session. Continue to recommend CIR follow up at d/c to maximize functional independence and facilitate return home when ready.   Follow Up Recommendations  CIR;Supervision/Assistance - 24 hour     Equipment Recommendations  None recommended by PT    Recommendations for Other Services       Precautions / Restrictions Precautions Precautions: Fall Restrictions Weight Bearing Restrictions: No    Mobility  Bed Mobility Overal bed mobility: Needs Assistance Bed Mobility: Supine to Sit;Sit to Supine     Supine to sit: Mod assist Sit to supine: HOB elevated;Mod assist   General bed mobility comments: Pt pulled up into long sitting at beginning of session. Required +2 mod assist for scooting in preparation for A>P transfer. At end of session assist again provided to scoot and prepare for leaning trunk back onto head of bed.   Transfers Overall transfer level: Needs assistance   Transfers: Comptroller transfers: Mod assist;+2 physical assistance   General transfer comment: Bed pad and pillow under LLE utilized for scooting backwards into the recliner. After pt transitioned to chair, RN entered and reports that transport is on their way to take pt to HD. Posterior>anterior transfer then performed to return pt to bed. In addition to physical assistance provided, pt was cued for hand placement and sequencing for efficiency of transfer.   Ambulation/Gait             General Gait Details: has not been ambulating outside of parallel bars - just recently got a prosthesis.   Stairs            Wheelchair Mobility    Modified Rankin (Stroke Patients Only)       Balance Overall balance assessment: Needs assistance Sitting-balance support: Bilateral upper extremity supported Sitting balance-Leahy Scale: Fair Sitting balance - Comments: supervision Postural control: Posterior lean                                  Cognition Arousal/Alertness: Lethargic Behavior During Therapy: Flat affect   Area of Impairment: Safety/judgement;Awareness;Problem solving;Orientation;Memory;Attention;Following commands                 Orientation Level: Disoriented to;Situation Current Attention Level: Sustained Memory: Decreased short-term memory Following Commands: Follows one step commands inconsistently;Follows one step commands with increased time Safety/Judgement: Decreased awareness of safety;Decreased awareness of deficits Awareness: Intellectual Problem Solving: Slow processing;Difficulty sequencing General Comments: Pt lethargic throughout session and making comments that are obviously incorrect in facts (wife present and correcting him).      Exercises      General  Comments        Pertinent Vitals/Pain Pain Assessment: Faces Faces Pain Scale: Hurts a little bit Pain Location: LLE    Home Living                      Prior Function            PT Goals (current goals can now be found  in the care plan section) Acute Rehab PT Goals Patient Stated Goal: None stated this session PT Goal Formulation: With patient/family Time For Goal Achievement: 08/05/17 Potential to Achieve Goals: Good Progress towards PT goals: Progressing toward goals    Frequency    Min 3X/week      PT Plan Current plan remains appropriate    Co-evaluation              AM-PAC PT "6 Clicks" Daily Activity  Outcome Measure  Difficulty turning over in bed (including adjusting bedclothes, sheets and blankets)?: A Lot Difficulty moving from lying on back to sitting on the side of the bed? : Unable Difficulty sitting down on and standing up from a chair with arms (e.g., wheelchair, bedside commode, etc,.)?: Unable Help needed moving to and from a bed to chair (including a wheelchair)?: Total Help needed walking in hospital room?: Total Help needed climbing 3-5 steps with a railing? : Total 6 Click Score: 7    End of Session   Activity Tolerance: Patient tolerated treatment well Patient left: in chair;with call bell/phone within reach;with family/visitor present Nurse Communication: Mobility status PT Visit Diagnosis: Other abnormalities of gait and mobility (R26.89)     Time: 2248-2500 PT Time Calculation (min) (ACUTE ONLY): 26 min  Charges:  $Therapeutic Activity: 23-37 mins                    G Codes:       Rolinda Roan, PT, DPT Acute Rehabilitation Services Pager: [place] 07/28/2017, 1:35 PM

## 2017-07-28 NOTE — Progress Notes (Deleted)
Established Critical Limb Ischemia Patient   History of Present Illness   Hayden [name] is a 47 y.o. (1970/10/19) male who presents with chief complaint: ***.    Previous operation(s) include: s/p R CFA to BK pop BPG w/ NR ips GSV, R 1st ray amp (11/05/16).  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  The patient is in the process of fitting for his R AKA prosthesis.  Pt then develops an ulcer in his left foot reportedly since June 2018.  The patient did not follow since his operation in May 2018.  Pt was scheduled for a angiography but he canceled the procedure.  He also failed to follow up with a cardiologist to establish care.  His current sx are: ***.  The patient has *** rest pain and wounds include: ***.  The patient notes symptoms have *** progressed.  The patient's treatment regimen currently included: maximal medical management and ***.  The patient's PMH, PSH, SH, and FamHx are unchanged from ***.  No current facility-administered medications for this visit.    Current Outpatient Medications  Medication Sig Dispense Refill  . cephALEXin (KEFLEX) 500 MG capsule Take 1 capsule (500 mg total) by mouth daily. Take AFTER (not before) HD on [name]-F. 10 capsule 0  . warfarin (COUMADIN) 5 MG tablet Take 1-1.5 tablets (5-7.5 mg total) by mouth every evening. Take 1 to 1.5 tablets by mouth daily as directed by coumadin clinic 30 tablet 0   Facility-Administered Medications Ordered in Other Visits  Medication Dose Route Frequency Provider Last Rate Last Dose  . amLODipine (NORVASC) tablet 10 mg  10 mg Oral Daily Gilles Chiquito B, MD   10 mg at 07/28/17 0847  . carvedilol (COREG) tablet 25 mg  25 mg Oral BID WC Gilles Chiquito B, MD   25 mg at 07/28/17 0847  . cinacalcet (SENSIPAR) tablet 90 mg  90 mg Oral Q breakfast Alric Seton, PA-C   90 mg at 07/28/17 0846  . cloNIDine (CATAPRES - Dosed in mg/24 hr) patch 0.2 mg  0.2 mg Transdermal Q Denyse Dago B, MD   0.2 mg at 07/22/17 1007  . divalproex (DEPAKOTE SPRINKLE) capsule 500 mg  500 mg Oral Q12H Gilles Chiquito B, MD   500 mg at 07/28/17 0847  . escitalopram (LEXAPRO) tablet 10 mg  10 mg Oral Daily Gilles Chiquito B, MD   10 mg at 07/28/17 0847  . ferric citrate (AURYXIA) tablet 630 mg  630 mg Oral TID WC Alric Seton, PA-C   630 mg at 07/28/17 0846  . gabapentin (NEURONTIN) capsule 100 mg  100 mg Oral BID Gilles Chiquito B, MD   100 mg at 07/28/17 0847  . heparin ADULT infusion 100 units/mL (25000 units/253mL sodium chloride 0.45%)  2,550 Units/hr Intravenous Continuous Leroy Libman, RPH 25.5 mL/hr at 07/28/17 0351 2,550 Units/hr at 07/28/17 0351  . hydrALAZINE (APRESOLINE) tablet 100 mg  100 mg Oral Q8H Gilles Chiquito B, MD   100 mg at 07/28/17 0648  . irbesartan (AVAPRO) tablet 300 mg  300 mg Oral Daily Gilles Chiquito B, MD   300 mg at 07/28/17 0847  . isosorbide dinitrate (ISORDIL) tablet 10 mg  10 mg Oral BID Gilles Chiquito B, MD   10 mg at 07/28/17 0846  . multivitamin (RENA-VIT) tablet 1 tablet  1 tablet Oral QHS Sid Falcon, MD   1 tablet at 07/27/17 2126  . nicotine (NICODERM CQ - dosed  in mg/24 hours) patch 21 mg  21 mg Transdermal Daily Patrecia Pour, MD   21 mg at 07/27/17 1730  . nitroGLYCERIN (NITROSTAT) SL tablet 0.4 mg  0.4 mg Sublingual Q5 min PRN Gilles Chiquito B, MD      . oxyCODONE (Oxy IR/ROXICODONE) immediate release tablet 15 mg  15 mg Oral Q6H PRN Patrecia Pour, MD   15 mg at 07/28/17 0349  . piperacillin-tazobactam (ZOSYN) IVPB 3.375 g  3.375 g Intravenous Q12H Skeet Simmer, RPH 12.5 mL/hr at 07/28/17 0845 3.375 g at 07/28/17 0845  . pravastatin (PRAVACHOL) tablet 40 mg  40 mg Oral Daily Gilles Chiquito B, MD   40 mg at 07/28/17 0847  . senna-docusate (Senokot-S) tablet 1 tablet  1 tablet Oral QHS PRN Sid Falcon, MD      . vancomycin (VANCOCIN) IVPB 1000 mg/200 mL premix  1,000 mg Intravenous Q M,W,F-HD Erenest Blank, [place] at 07/25/17 1127     On ROS today: ***, ***   Physical Examination  ***There were no vitals filed for this visit. ***There is no height or weight on file to calculate BMI.  General {LOC:19197::"Somulent","Alert"}, {Orientation:19197::"Confused","O x 3"}, {Weight:19197::"Obese","Cachectic","WD"}, {General state of health:19197::"Ill appearing","Elderly","NAD"}  Pulmonary {Chest wall:19197::"Asx chest movement","Sym exp"}, {Air movt:19197::"Decreased *** air movt","good B air movt"}, {BS:19197::"rales on ***","rhonchi on ***","wheezing on ***","CTA B"}  Cardiac {Rhythm:19197::"Irregularly, irregular rate and rhythm","RRR, Nl S1, S2"}, {Murmur:19197::"Murmur present: ***","no Murmurs"}, {Rubs:19197::"Rub present: ***","No rubs"}, {Gallop:19197::"Gallop present: ***","No S3,S4"}  Vascular Vessel Right Left  Radial {Palpable:19197::"Not palpable","Faintly palpable","Palpable"} {Palpable:19197::"Not palpable","Faintly palpable","Palpable"}  Brachial {Palpable:19197::"Not palpable","Faintly palpable","Palpable"} {Palpable:19197::"Not palpable","Faintly palpable","Palpable"}  Carotid Palpable, {Bruit:19197::"Bruit present","No Bruit"} Palpable, {Bruit:19197::"Bruit present","No Bruit"}  Aorta Not palpable N/A  Femoral {Palpable:19197::"Not palpable","Faintly palpable","Palpable"} {Palpable:19197::"Not palpable","Faintly palpable","Palpable"}  Popliteal {Palpable:19197::"Prominently palpable","Not palpable"} {Palpable:19197::"Prominently palpable","Not palpable"}  PT {Palpable:19197::"Not palpable","Faintly palpable","Palpable"} {Palpable:19197::"Not palpable","Faintly palpable","Palpable"}  DP {Palpable:19197::"Not palpable","Faintly palpable","Palpable"} {Palpable:19197::"Not palpable","Faintly palpable","Palpable"}    Gastro- intestinal soft, {Distension:19197::"distended","non-distended"}, {TTP:19197::"TTP in *** quadrant","appropriate tenderness to palpation","non-tender to palpation"}, {Guarding:19197::"Guarding  and rebound in *** quadrant","No guarding or rebound"}, {HSM:19197::"HSM present","no HSM"}, {Masses:19197::"Mass present: ***","no masses"}, {Flank:19197::"CVAT on ***","Flank bruit present on ***","no CVAT B"}, {AAA:19197::"Palpable prominent aortic pulse","No palpable prominent aortic pulse"}, {Surgical incision:19197::"Surg incisions: ***","Surgical incisions well healed"," "}  Musculo- skeletal M/S 5/5 throughout {MS:19197::"except ***"," "}, Extremities without ischemic changes {MS:19197::"except ***"," "}, {Edema:19197::"Pitting edema present: ***","Non-pitting edema present: ***","No edema present"}, {Varicosities:19197::"Varicosities present: ***","No visible varicosities "}, {LDS:19197::"Lipodermatosclerosis present: ***","No Lipodermatosclerosis present"}  Neurologic Cranial nerves 2-12 intact{CN:19197::" except ***"," "}, Pain and light touch intact in extremities{CN:19197::" except for decreased sensation in ***"," "}, Motor exam as listed above    Non-Invasive Vascular Imaging   ABI (***)  R:   ABI: *** (***),   PT: {Signals:19197::"none","mono","bi","tri"}  DP: {Signals:19197::"none","mono","bi","tri"}  TBI:  ***  L:   ABI: *** (***),   PT: {Signals:19197::"none","mono","bi","tri"}  DP: {Signals:19197::"none","mono","bi","tri"}  TBI: ***   Medical Decision Making   Hayden [name] is a 47 y.o. male who presents with: ***critical limb ischemia with *** wounds.   Based on the patient's vascular studies and examination, I have offered the patient: ***.  I discussed in depth with the patient the nature of atherosclerosis, and emphasized the importance of maximal medical management including strict control of blood pressure, blood glucose, and lipid levels, antiplatelet agents, obtaining regular exercise, and cessation of smoking.    The patient is aware that without maximal medical management the underlying atherosclerotic disease process will progress, limiting  the benefit of any interventions. The patient  is currently on a statin: Pravachol.  The patient is currently not on an anti-platelet due to bleeding risks related to use of an anticoagulant.  Thank you for allowing [place] to participate in this patient's care.   Adele Barthel, MD, FACS [facility] Office: [phone] Pager: [phone]
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
## 2017-07-28 NOTE — Progress Notes (Signed)
Inpatient Rehabilitation  Continuing to follow for timing of medical readiness and IP Rehab bed availability.  Note cardiology work up at this time.  Call if questions.   Carmelia Roller., CCC/SLP Admission Coordinator  [place]  Cell [phone]
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
## 2017-07-28 NOTE — Progress Notes (Signed)
Patient off floor to dialysis

## 2017-07-28 NOTE — Progress Notes (Signed)
[facility] Progress Note   Subjective: Disoriented, confused this am. Doesn't know why he's here. Wife at bedside.   Objective Vitals:   07/27/17 1347 07/27/17 2127 07/28/17 0415 07/28/17 1226  BP: (!) 114/59 (!) 124/56 128/66   Pulse: 73 64 68   Resp: 18 16 17 17   Temp: 98.8 F (37.1 C) 98.6 F (37 C) 98.3 F (36.8 C) 98.4 F (36.9 C)  TempSrc: Oral Oral Oral Oral  SpO2: 93% 96% 98%   Weight:    102.4 kg (225 lb 12 oz)  Height:       Physical Exam General: Obese male, sitting up in bed, NAD Heart: RRR 3/6 SEM  Lungs: CTAB Abdomen: soft, obese +BS Extremities: s/p R AKA; L LE with diffuse erythema, necrotic areas to skin, covered by dressing  Dialysis Access: L AVF +bruit     Recent Labs  Lab 07/22/17 0734  07/23/17 1519  07/25/17 0649 07/26/17 0559 07/28/17 0705  NA 137   < >  --    < > 137 134* 135  K 4.8   < >  --    < > 4.2 3.7 4.6  CL 94*   < >  --    < > 94* 94* 93*  CO2 24   < >  --    < > 24 26 25   GLUCOSE 74   < >  --    < > 93 92 99  BUN 50*   < >  --    < > 45* 26* 50*  CREATININE 7.35*   < >  --    < > 7.15* 4.91* 8.02*  CALCIUM 9.5   < >  --    < > 8.6* 8.7* 8.4*  PHOS 10.2*  --  12.6*  --   --   --  7.8*   < > = values in this interval not displayed.    Recent Labs  Lab 07/24/17 0758 07/25/17 0649 07/26/17 0559 07/27/17 0624 07/28/17 0705  WBC 10.1 7.6 8.6 9.8 10.6*  HGB 12.2* 10.8* 11.1* 11.3* 11.2*  HCT 36.2* 34.5* 35.6* 36.9* 36.5*  MCV 85.4 87.1 87.5 89.6 89.2  PLT 423* 376 346 352 330   Blood Culture    Component Value Date/Time   SDES BLOOD RIGHT HAND 07/19/2017 2330   SPECREQUEST  07/19/2017 2330    BOTTLES DRAWN AEROBIC ONLY Blood Culture adequate volume   CULT NO GROWTH 5 DAYS 07/19/2017 2330   REPTSTATUS 07/25/2017 FINAL 07/19/2017 2330     Recent Labs  Lab 07/27/17 1208 07/27/17 1653 07/27/17 2125 07/28/17 0638 07/28/17 1136  GLUCAP 101* 89 152* 90 126*    Lab Results  Component Value Date    INR 1.29 07/28/2017   INR 1.29 07/27/2017   INR 1.29 07/26/2017   Medications: . heparin 2,550 Units/hr (07/28/17 0351)  . piperacillin-tazobactam (ZOSYN)  IV 3.375 g (07/28/17 0845)  . vancomycin Stopped (07/25/17 1127)   . amLODipine  10 mg Oral Daily  . carvedilol  25 mg Oral BID WC  . cinacalcet  90 mg Oral Q breakfast  . cloNIDine  0.2 mg Transdermal Q Tue  . divalproex  500 mg Oral Q12H  . escitalopram  10 mg Oral Daily  . ferric citrate  630 mg Oral TID WC  . gabapentin  100 mg Oral BID  . hydrALAZINE  100 mg Oral Q8H  . irbesartan  300 mg Oral Daily  . isosorbide dinitrate  10  mg Oral BID  . multivitamin  1 tablet Oral QHS  . nicotine  21 mg Transdermal Daily  . pravastatin  40 mg Oral Daily    Dialysis Orders:  MWF [facility] 4h 450/800 98kg 2/2.25 bath L AVF Hep 2400 - Hectoral 41mcg IV q HD - Venofer 100mg  x 10 ordered (4 given) - Mircera 271mcg IV q 2 weeks (last 1/18) -home BP Rx norvasc 10, coreg 25 bid, clon patch 0.2 weekly, lasix 120 bid, hydral 100 tid, avapro 300   Assessment/Plan: 1. Leg eschar 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 ESRD HD MWF. For HD today. K 4.6 3. 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 Clininal appearance consistent, Definite substrate for same, VERY poor phosphorus control/binder non-adherence. Off hectorol. On sensipar, avoid  Ca based binders. If + bx will need Na thiosulfate at HD.  4. AMS - waxing/waning  5. Bipolar 6. HTNcontrolled here onmult bp meds, net UF 2 L Friday post wt 96.9; at EDW - volume can do down a little more- furosemide stopped 7. Anemiahgb 11.2 stable 9% sat 1/24 ferritin 1427 - Fe in  binders - no IV repletion yet- ESA due 2/1 8. DM2 9. PVD- right aka 10. Aflutter- on heparin - off coumadinpending  bx results 11.  12. Nutrition - renal diet alb 2.9- eating food brought in from outside and not getting binders with this  Timing of outside food and  binders an issue 13. CVA cardioembolic/?septic/ emboli - carotid US done-no thrombus on Echo 14. Tobacco abuse 15. CM/CHF/MS/AS- cards evaluating -to have CT surgery evaluate  Lynnda Child PA-C [facility] Pager [phone] 07/28/2017,12:43 PM  LOS: 8 days   I have seen and examined this patient and agree with plan and assessment in the above note with renal recommendations/intervention highlighted. MWF HD. Cards evaluating severe AS. Dr. Burt Knack saw today, evaluating for possible TAVR. 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 Off VDRA, ^ sensipar. Attempt phos control.   Mauriana Dann B,MD 07/28/2017 7:54 PM

## 2017-07-28 NOTE — Plan of Care (Signed)
  Progressing Education: Knowledge of General Education information will improve 07/28/2017 1605 - Progressing by Darletta Moll, RN Health Behavior/Discharge Planning: Ability to manage health-related needs will improve 07/28/2017 1605 - Progressing by Darletta Moll, RN Clinical Measurements: Will remain free from infection 07/28/2017 1605 - Progressing by Darletta Moll, RN Diagnostic test results will improve 07/28/2017 1605 - Progressing by Darletta Moll, RN Respiratory complications will improve 07/28/2017 1605 - Progressing by Darletta Moll, RN Cardiovascular complication will be avoided 07/28/2017 1605 - Progressing by Darletta Moll, RN Nutrition: Adequate nutrition will be maintained 07/28/2017 1605 - Progressing by Darletta Moll, RN Coping: Level of anxiety will decrease 07/28/2017 1605 - Progressing by Darletta Moll, RN Elimination: Will not experience complications related to bowel motility 07/28/2017 1605 - Progressing by Darletta Moll, RN Will not experience complications related to urinary retention 07/28/2017 1605 - Progressing by Darletta Moll, RN Pain Managment: General experience of comfort will improve 07/28/2017 1605 - Progressing by Darletta Moll, RN Safety: Ability to remain free from injury will improve 07/28/2017 1605 - Progressing by Darletta Moll, RN Skin Integrity: Risk for impaired skin integrity will decrease 07/28/2017 1605 - Progressing by Darletta Moll, RN Education: Knowledge of secondary prevention will improve 07/28/2017 1605 - Progressing by Darletta Moll, RN Knowledge of patient specific risk factors addressed and post discharge goals established will improve 07/28/2017 1605 - Progressing by Darletta Moll, RN

## 2017-07-28 NOTE — Progress Notes (Signed)
PROGRESS NOTE  Levelle Edelen [place]  HQI:[id number] DOB: 1971/02/22 DOA: 07/19/2017 PCP: [name], DO   Brief Narrative: Hayden [name] is a 47 y.o. 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 XR showed calcified vessels without evidence of emphysema or fracture. Vancomycin and zosyn were given, nephrology consulted for HD while inpatient, and he was admitted. Fistulogram performed 1/22 showed widely patent AVF circuit. The patient grew more lethargic following the procedure. Neurology was consulted, sedating medications held. EEG showed nonspecific slowing. MRI showed multiple small supra- and infratentorial diffusion abnormalities most consistent with infarcts spanning multiple vascular territories.   Assessment & Plan: Cellulitis and abscess of left lower extremity: He has risk factors including h/o DM, PVD w/hx right AKA. U/S negative for DVT. Blood cultures negative.  - Erythema worsening/stable despite many days of broad spectrum abx. I've consulted ID, Dr. Linus Salmons who will evaluate the patient, though suspicion is that this is all related to calciphylaxis. , significant risk factors for complications. No abscess. Continue vanc/zosyn for now, plan to discuss with ID for ongoing recommendations at discharge. May DC abx.   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  - Follow up biopsy results from 1/25 - Holding coumadin, continue heparin, holding Ca. Phos binder per nephrology. - Pain control improved with oxycodone, no plans to change at this time, but will monitor mental status.  CVA: Seen on motion-degraded MRI. Multiple vascular distributions  involved consistent with cardioembolic/septic emboli. Echo showed no thrombus. Carotid U/S limited by pt movement showed 1-39% stenosis of bilateral ICA's. 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  - Stroke team signed off, no new recommendations at this time.  - Continue statin - IV heparin, will require ongoing anticoagulation.   Acute systolic CHF with severe aortic stenosis, mitral stenosis: Echo with newly depressed LVEF to 35-40% from 60-65% with akinesis of mid-apicalanteroseptal myocardium. No wall motion abnormalities on last echo in Aug 2018. Also showed severe aortic stenosis, mitral stenosis. Pt has no chest pain. - Pending CT surgery evaluation today.   ESRD on hemodialysis with mild hyperkalemia and mild anemia - Nephrology providing HD on schedule.     Acute metabolic encephalopathy: Resolving. With waxing/waning course, suspect delirium worsened by sedating medications and CVA. EEG 1/23 nonspecific slowing without epileptiform discharges.  - Ammonia mildly elevated, gave lactulose, though had significant diarrhea and mental status improved. Holding for now.  - Hold sedating medications as able.   Above knee amputation of right lower extremity  - PT evaluation, eventual dispo to CIR.    Complete heart block: s/p PPM placement Nov 2018.  - No signs pacemaker is malfunctioning.      Atrial flutter: CHA2DS2-VASc Scoreis 5 - HR is well controlled, continue coreg.   - Heparin per pharmacy, stopped coumadin    Hyperlipidemia: - Continue statin.   Bipolar/depression:  - Continue depakote and lexapro since he's taking po.  Subtherapeutic INR: On arrival  DVT prophylaxis: IV heparin. Coumadin on hold. Code Status: Full Family Communication: Wife at the bedside Disposition Plan: CIR tentatively planned. SNF being arranged as a back up plan, though this is not preferred.  Consultants:   Nephrology  IR  Neurology  General surgery  Cardiology  Cardiothoracic  surgery  ID  Procedures:   HD 1/21, 1/23, 1/25  EEG 1/23:  The patient is poorly responsive during the recording.  During maximal wakefulness, there is a symmetric 7 Hz posterior dominant rhythm that attenuates with eye opening. This is admixed with diffuse 4-5 Hz theta and 2-3 Hz delta slowing of the waking background.  During drowsiness, there is an increase in theta slowing of the background.  Stage 2 sleep is not seen.  During the study, he exhibited occasional brief jerk of the extremities with no electrographic correlate.  There were no epileptiform discharges or electrographic seizures seen.   EKG lead was unremarkable.  Impression: This EEG is abnormal due to diffuse slowing of the waking background.  Echocardiogram 07/24/2017: - Left ventricle: The cavity size was normal. Wall thickness was   increased in a pattern of severe LVH. Systolic function was   moderately reduced. The estimated ejection fraction was in the   range of 35% to 40%. There is akinesis of the apical myocardium.   There is akinesis of the mid-apicalanteroseptal myocardium. - Aortic valve: Valve mobility was restricted. There was severe   stenosis. Valve area (VTI): 0.44 cm^2. Valve area (Vmax): 0.38   cm^2. Valve area (Vmean): 0.41 cm^2. - Mitral valve: Severely calcified annulus. The findings are   consistent with severe stenosis. Valve area by pressure   half-time: 1.84 cm^2. Valve area by continuity equation (using   LVOT flow): 0.95 cm^2. - Left atrium: The atrium was moderately dilated. - Right ventricle: The cavity size was moderately dilated. - Pulmonary arteries: PA peak pressure: 32 mm Hg (S).  Impressions: - Akinesis of the distal anteroseptal wall and apex with overall   moderate LV dysfunction; severe LVH; heavily calcified aortic   valve with severe AS (mean gradient 60 mmHg); severe MAC with   severe MS (mean gradient 12 mmHg); moderate LAE; moderate RVE;   mild  TR.  Antimicrobials:  Vancomycin, Zosyn  Subjective: Slightly more confused this morning but remains oriented. Getting up with PT.    Objective: Vitals:   07/27/17 0551 07/27/17 1347 07/27/17 2127 07/28/17 0415  BP: 136/79 (!) 114/59 (!) 124/56 128/66  Pulse: 69 73 64 68  Resp: _0 Temp: 98.1 F (36.7 C) 98.8 F (37.1 C) 98.6 F (37 C) 98.3 F (36.8 C)  TempSrc: Axillary Oral Oral Oral  SpO2: 91% 93% 96% 98%  Weight:      Height:        Intake/Output Summary (Last 24 hours) at 07/28/2017 1225 Last data filed at 07/28/2017 0700 Gross per 24 hour  Intake 804 ml  Output -  Net 804 ml   Filed Weights   07/24/17 0600 07/25/17 0730 07/25/17 1135  Weight: 98.5 kg (217 lb 2.5 oz) 98 kg (216 lb 0.8 oz) 96.9 kg (213 lb 10 oz)   Gen: Chronically ill-appearing 47 y.o. male alert in HD Pulm: Non-labored breathing. Clear bilaterally CV: RRR, II/VI systolic murmur throughout. No JVD. GI: Abdomen soft, nondistended, +BS.   Ext: RLE upper femur amputation site stable, normal appearing. Left shin with sharply demarcated hyperpigmented/purpuric lesions with surrounding tender blanchable erythema and induration without palpable abscess. Developing blood blisters laterally. No purulence noted. (Pictures below) Overall worsening. Skin: As above Neuro: Drowsy, oriented. Upper extremity ataxia noted bilaterally   Psych: Mood euthymic, affect congruent.   07/28/2017   07/26/2017   07/25/2017   07/24/2017  CBC: Recent Labs  Lab 07/24/17 2458  07/25/17 6384 07/26/17 0559 07/27/17 0624 07/28/17 0705  WBC 10.1 7.6 8.6 9.8 10.6*  HGB 12.2* 10.8* 11.1* 11.3* 11.2*  HCT 36.2* 34.5* 35.6* 36.9* 36.5*  MCV 85.4 87.1 87.5 89.6 89.2  PLT 423* 376 346 352 536   Basic Metabolic Panel: Recent Labs  Lab 07/22/17 0734 07/23/17 0539 07/23/17 1519 07/24/17 0758 07/25/17 0649 07/26/17 0559 07/28/17 0705  NA 137 135  --  136 137 134* 135  K 4.8 4.7  --  3.7 4.2 3.7 4.6  CL  94* 94*  --  94* 94* 94* 93*  CO2 24 20*  --  _0 GLUCOSE 74 138*  --  131* 93 92 99  BUN 50* 68*  --  27* 45* 26* 50*  CREATININE 7.35* 8.64*  --  5.37* 7.15* 4.91* 8.02*  CALCIUM 9.5 8.6*  --  8.4* 8.6* 8.7* 8.4*  PHOS 10.2*  --  12.6*  --   --   --  7.8*   Liver Function Tests: Recent Labs  Lab 07/22/17 0734 07/28/17 0705  ALBUMIN 2.9* 2.4*   Radiology Studies: No results found. Scheduled Meds: . amLODipine  10 mg Oral Daily  . carvedilol  25 mg Oral BID WC  . cinacalcet  90 mg Oral Q breakfast  . cloNIDine  0.2 mg Transdermal Q Tue  . divalproex  500 mg Oral Q12H  . escitalopram  10 mg Oral Daily  . ferric citrate  630 mg Oral TID WC  . gabapentin  100 mg Oral BID  . hydrALAZINE  100 mg Oral Q8H  . irbesartan  300 mg Oral Daily  . isosorbide dinitrate  10 mg Oral BID  . multivitamin  1 tablet Oral QHS  . nicotine  21 mg Transdermal Daily  . pravastatin  40 mg Oral Daily   Continuous Infusions: . heparin 2,550 Units/hr (07/28/17 0351)  . piperacillin-tazobactam (ZOSYN)  IV 3.375 g (07/28/17 0845)  . vancomycin Stopped (07/25/17 1127)    LOS: 8 days   Time spent: 25 minutes.  Vance Gather, MD [name] Pager [phone]  If 7PM-7AM, please contact night-coverage [email or web address] Password [facility] 07/28/2017, 12:25 PM

## 2017-07-29 ENCOUNTER — Encounter (HOSPITAL_COMMUNITY): Payer: Self-pay

## 2017-07-29 LAB — RENAL FUNCTION PANEL
ANION GAP: 16 — AB (ref 5–15)
Albumin: 2.5 g/dL — ABNORMAL LOW (ref 3.5–5.0)
BUN: 34 mg/dL — ABNORMAL HIGH (ref 6–20)
CO2: 24 mmol/L (ref 22–32)
Calcium: 8.4 mg/dL — ABNORMAL LOW (ref 8.9–10.3)
Chloride: 94 mmol/L — ABNORMAL LOW (ref 101–111)
Creatinine, Ser: 6.4 mg/dL — ABNORMAL HIGH (ref 0.61–1.24)
GFR calc non Af Amer: 9 mL/min — ABNORMAL LOW (ref >60)
GFR, EST AFRICAN AMERICAN: 11 mL/min — AB (ref >60)
GLUCOSE: 104 mg/dL — AB (ref 65–99)
PHOSPHORUS: 5.3 mg/dL — AB (ref 2.5–4.6)
POTASSIUM: 4.2 mmol/L (ref 3.5–5.1)
Sodium: 134 mmol/L — ABNORMAL LOW (ref 135–145)

## 2017-07-29 LAB — GLUCOSE, CAPILLARY
GLUCOSE-CAPILLARY: 130 mg/dL — AB (ref 65–99)
Glucose-Capillary: 107 mg/dL — ABNORMAL HIGH (ref 65–99)
Glucose-Capillary: 125 mg/dL — ABNORMAL HIGH (ref 65–99)

## 2017-07-29 LAB — PROTIME-INR
INR: 1.31
Prothrombin Time: 16.1 seconds — ABNORMAL HIGH (ref 11.4–15.2)

## 2017-07-29 LAB — CBC
HEMATOCRIT: 35.7 % — AB (ref 39.0–52.0)
HEMOGLOBIN: 11.2 g/dL — AB (ref 13.0–17.0)
MCH: 27.7 pg (ref 26.0–34.0)
MCHC: 31.4 g/dL (ref 30.0–36.0)
MCV: 88.1 fL (ref 78.0–100.0)
Platelets: 314 10*3/uL (ref 150–400)
RBC: 4.05 MIL/uL — ABNORMAL LOW (ref 4.22–5.81)
RDW: 18.9 % — ABNORMAL HIGH (ref 11.5–15.5)
WBC: 11.5 10*3/uL — ABNORMAL HIGH (ref 4.0–10.5)

## 2017-07-29 LAB — HEPARIN LEVEL (UNFRACTIONATED): Heparin Unfractionated: 0.24 IU/mL — ABNORMAL LOW (ref 0.30–0.70)

## 2017-07-29 MED ORDER — SODIUM CHLORIDE 0.9% FLUSH
3.0000 mL | Freq: Two times a day (BID) | INTRAVENOUS | Status: DC
  Administered 2017-07-29: 3 mL via INTRAVENOUS

## 2017-07-29 MED ORDER — SODIUM CHLORIDE 0.9 % IV SOLN: INTRAVENOUS | Status: DC

## 2017-07-29 MED ORDER — SODIUM CHLORIDE 0.9% FLUSH: 3.0000 mL | INTRAVENOUS | Status: DC | PRN

## 2017-07-29 MED ORDER — SODIUM CHLORIDE 0.9 % IV SOLN: 250.0000 mL | INTRAVENOUS | Status: DC | PRN

## 2017-07-29 MED ORDER — ASPIRIN 81 MG PO CHEW
81.0000 mg | CHEWABLE_TABLET | ORAL | Status: AC
  Administered 2017-07-30: 81 mg via ORAL
  Filled 2017-07-29: qty 1

## 2017-07-29 NOTE — Plan of Care (Signed)
  Education: Knowledge of secondary prevention will improve 07/29/2017 0543 - Progressing by Janee Morn, RN

## 2017-07-29 NOTE — Progress Notes (Signed)
Progress Note  Patient Name: Hayden [name] Date of Encounter: 07/29/2017  Primary Cardiologist: No primary care provider on file.   Subjective   Feeling a little better today. Frustrated with being in the hospital. No chest pain. Complains of shortness of breath lying down.   Inpatient Medications    Scheduled Meds: . amLODipine  10 mg Oral Daily  . carvedilol  25 mg Oral BID WC  . cinacalcet  90 mg Oral Q breakfast  . cloNIDine  0.2 mg Transdermal Q Tue  . divalproex  500 mg Oral Q12H  . escitalopram  10 mg Oral Daily  . ferric citrate  630 mg Oral TID WC  . gabapentin  100 mg Oral BID  . hydrALAZINE  100 mg Oral Q8H  . irbesartan  300 mg Oral Daily  . isosorbide dinitrate  10 mg Oral BID  . multivitamin  1 tablet Oral QHS  . nicotine  21 mg Transdermal Daily  . pravastatin  40 mg Oral Daily   Continuous Infusions: . heparin 2,600 Units/hr (07/29/17 1530)   PRN Meds: nitroGLYCERIN, oxyCODONE, senna-docusate   Vital Signs    Vitals:   07/28/17 2008 07/29/17 0350 07/29/17 0757 07/29/17 1457  BP: (!) 154/86 (!) 145/70 (!) 147/72 117/63  Pulse: 85 72 86 66  Resp: 20 18 14 16   Temp: 98.5 F (36.9 C) 99.5 F (37.5 C) 98.1 F (36.7 C) 98.4 F (36.9 C)  TempSrc: Oral Oral Oral Oral  SpO2: 93% 95% 96% 94%  Weight:      Height:        Intake/Output Summary (Last 24 hours) at 07/29/2017 1556 Last data filed at 07/29/2017 1500 Gross per 24 hour  Intake 730 ml  Output 3500 ml  Net -2770 ml   Filed Weights   07/25/17 1135 07/28/17 1226 07/28/17 1640  Weight: 213 lb 10 oz (96.9 kg) 225 lb 12 oz (102.4 kg) 218 lb 4.1 oz (99 kg)    Telemetry    Sinus rhythm - Personally Reviewed  Physical Exam  Alert, oriented male, NAD, chronically ill appearing GEN: No acute distress.   Neck: No JVD Cardiac: RRR, 4/6 harsh late peaking systolic murmur heard throughout Respiratory: Clear to auscultation bilaterally. GI: Soft, nontender, non-distended  MS: Right AKA,  left leg with distal erythema, rash unchanged Neuro:  Nonfocal  Psych: Normal affect   Labs    Chemistry Recent Labs  Lab 07/26/17 0559 07/28/17 0705 07/29/17 1244  NA 134* 135 134*  K 3.7 4.6 4.2  CL 94* 93* 94*  CO2 26 25 24   GLUCOSE 92 99 104*  BUN 26* 50* 34*  CREATININE 4.91* 8.02* 6.40*  CALCIUM 8.7* 8.4* 8.4*  ALBUMIN  --  2.4* 2.5*  GFRNONAA 13* 7* 9*  GFRAA 15* 8* 11*  ANIONGAP 14 17* 16*     Hematology Recent Labs  Lab 07/27/17 0624 07/28/17 0705 07/29/17 1244  WBC 9.8 10.6* 11.5*  RBC 4.12* 4.09* 4.05*  HGB 11.3* 11.2* 11.2*  HCT 36.9* 36.5* 35.7*  MCV 89.6 89.2 88.1  MCH 27.4 27.4 27.7  MCHC 30.6 30.7 31.4  RDW 19.3* 19.0* 18.9*  PLT 352 330 314    Cardiac EnzymesNo results for input(s): TROPONINI in the last 168 hours. No results for input(s): TROPIPOC in the last 168 hours.   BNPNo results for input(s): BNP, PROBNP in the last 168 hours.   DDimer No results for input(s): DDIMER in the last 168 hours.   Radiology  No results found.   Patient Profile     47 y.o. male with past medical history of diabetes mellitus, prior CVA, seizures, prior partial nephrectomy for kidney cancer, end-stage renal disease, prior right AKA, hypertension, hyperlipidemia, bipolar disorder, history of atrial flutter, prior pacemaker for evaluation of aortic stenosis and cardiomyopathy.  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  Had decreased mental status transiently and MRI showed multiple small infarcts.  Echocardiogram was performed and showed ejection fraction 35-40% with akinesis of the apical myocardium,severe aortic stenosis with mean gradient 60 mmHg, severe mitral annular calcification with severe mitral stenosis with with mean gradient 12 mmHg, moderate left atrial enlargement and moderate right ventricular enlargement.   Assessment & Plan    1. Severe valvular heart disease - reviewed echo study with multidisciplinary heart team. He  has severe AS and moderate-severe MR. TAVR might ultimately be a reasonable treatment option. I discussed his case and potential problems with the patient and his wife at length today. They understand treatment options might be limited in the setting of severe comorbid medical conditions and severely calcified heart. He will need CTA studies and a diagnostic R/L heart cath to better define treatment options. Anticipate formal cardiac surgical evaluation after these studies are completed. I don't think he will be a candidate for double valve surgery +/- CABG and anticipate that treatment options will be limited to TAVR versus palliative medical therapy.   2. Acute on chronic combined systolic and diastolic heart failure. Pt unable to lie flat at present. Will need to do dialysis session tomorrow morning prior to diagnostic R/L heart cath. Discussed with Dr Marval Regal who will arrange first shift dialysis. Tentatively scheduled for R/L heart cath 4pm with Dr Martinique.  3. Atrial flutter with hx of CVA. Skin biopsy c/w calciphylaxis. Currently on IV heparin.   For questions or updates, please contact [place] Please consult [email or web address] for contact info under Cardiology/STEMI.      Signed, Sherren Mocha, MD  07/29/2017, 3:56 PM

## 2017-07-29 NOTE — Progress Notes (Signed)
[name] Progress Note   Subjective:  HD yesterday net UF 3.5L Feels SOB today, O2 helping CTS following for possible cath/CTA studies  Objective Vitals:   07/28/17 1640 07/28/17 2008 07/29/17 0350 07/29/17 0757  BP: 138/73 (!) 154/86 (!) 145/70 (!) 147/72  Pulse: 83 85 72 86  Resp: 18 20 18 14   Temp: 98.4 F (36.9 C) 98.5 F (36.9 C) 99.5 F (37.5 C) 98.1 F (36.7 C)  TempSrc: Oral Oral Oral Oral  SpO2: 98% 93% 95% 96%  Weight: 99 kg (218 lb 4.1 oz)     Height:       Physical Exam General: Obese male, sitting up in bed, NAD Heart: RRR 3/6 SEM  Lungs: CTAB Abdomen: soft, obese +BS Extremities: s/p R AKA; L LE with diffuse erythema, necrotic areas to skin, covered by dressing  Dialysis Access: L AVF +bruit     Recent Labs  Lab 07/23/17 1519  07/25/17 0649 07/26/17 0559 07/28/17 0705  NA  --    < > 137 134* 135  K  --    < > 4.2 3.7 4.6  CL  --    < > 94* 94* 93*  CO2  --    < > 24 26 25   GLUCOSE  --    < > 93 92 99  BUN  --    < > 45* 26* 50*  CREATININE  --    < > 7.15* 4.91* 8.02*  CALCIUM  --    < > 8.6* 8.7* 8.4*  PHOS 12.6*  --   --   --  7.8*   < > = values in this interval not displayed.    Recent Labs  Lab 07/24/17 0758 07/25/17 0649 07/26/17 0559 07/27/17 0624 07/28/17 0705  WBC 10.1 7.6 8.6 9.8 10.6*  HGB 12.2* 10.8* 11.1* 11.3* 11.2*  HCT 36.2* 34.5* 35.6* 36.9* 36.5*  MCV 85.4 87.1 87.5 89.6 89.2  PLT 423* 376 346 352 330   Blood Culture    Component Value Date/Time   SDES BLOOD RIGHT HAND 07/19/2017 2330   SPECREQUEST  07/19/2017 2330    BOTTLES DRAWN AEROBIC ONLY Blood Culture adequate volume   CULT NO GROWTH 5 DAYS 07/19/2017 2330   REPTSTATUS 07/25/2017 FINAL 07/19/2017 2330     Recent Labs  Lab 07/28/17 0638 07/28/17 1136 07/28/17 1744 07/28/17 2046 07/29/17 0623  GLUCAP 90 126* 129* 124* 107*    Lab Results  Component Value Date   INR 1.29 07/28/2017   INR 1.29 07/27/2017   INR 1.29  07/26/2017   Medications: . heparin 2,550 Units/hr (07/28/17 1747)   . amLODipine  10 mg Oral Daily  . carvedilol  25 mg Oral BID WC  . cinacalcet  90 mg Oral Q breakfast  . cloNIDine  0.2 mg Transdermal Q Tue  . divalproex  500 mg Oral Q12H  . escitalopram  10 mg Oral Daily  . ferric citrate  630 mg Oral TID WC  . gabapentin  100 mg Oral BID  . hydrALAZINE  100 mg Oral Q8H  . irbesartan  300 mg Oral Daily  . isosorbide dinitrate  10 mg Oral BID  . multivitamin  1 tablet Oral QHS  . nicotine  21 mg Transdermal Daily  . pravastatin  40 mg Oral Daily    Dialysis Orders:  MWF NW 4h 450/800 98kg 2/2.25 bath L AVF Hep 2400  - Hectoral 36mcg IV q HD - Venofer 100mg  x 10 ordered (4  given) - Mircera 246mcg IV q 2 weeks (last 1/18) -home BP Rx norvasc 10, coreg 25 bid, clon patch 0.2 weekly, lasix 120 bid, hydral 100 tid, avapro 300   Assessment/Plan: 1. 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 ABI/calcified vessels ID has stopped ATB's as feel most likely calciphylaxis and less likely cellulitis 2. ESRD HD MWF. Next HD 1/30. 2K/2Ca bath.  3. 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 Clininal appearance consistent, Definite substrate for same, VERY poor phosphorus control/binder non-adherence. Off hectorol. On sensipar, avoid  Ca based binders. If + bx will need Na thiosulfate at HD.  4. AMS - waxing/waning  5. Bipolar 6. HTNcontrolled here onmult bp meds. Post HD wt 1/28 99kg. Net UF 3.5L. Titrate volume down as tolerated 7. Anemiahgb 11.2 stable 9% sat 1/24 ferritin 1427 - Fe in binders - no IV repletion yet- ESA due 2/1 8. DM2 9. PVD- right aka 10. Aflutter- on heparin - off coumadinpending  bx results 11.  12. Nutrition - renal diet. Low albumin. eating food brought in from outside and not getting binders  with this  Timing of outside food and  binders an issue 13. CVA cardioembolic/?septic/ emboli - carotid US done-no thrombus on Echo 14. Tobacco abuse 15. CM/CHF/MS/AS- cards evaluating. CTS following for possible TAVR.    Hayden Child PA-C [facility] Pager [phone] 07/29/2017,9:50 AM  I have seen and examined this patient and agree with plan and assessment in the above note with renal recommendations/intervention highlighted.  Pt eating [place] takeout instead of his renal diet. Stressed the need to take his binders with his meals and avoid high phos foods (eating fried shrimp).  For bx tomorrow and will continue with HD qMWF while he remains an inpatient. Broadus John A Dresden Lozito,MD 07/29/2017 1:12 PM

## 2017-07-29 NOTE — Progress Notes (Signed)
PROGRESS NOTE  Hayden [name]  SNK:[id number] DOB: March 10, 1971 DOA: 07/19/2017 PCP: [name], DO   Brief Narrative: Hayden [name] is a 47 y.o. 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 XR showed calcified vessels without evidence of emphysema or fracture. Vancomycin and zosyn were given, nephrology consulted for HD while inpatient, and he was admitted. Fistulogram performed 1/22 showed widely patent AVF circuit. The patient grew more lethargic following the procedure. Neurology was consulted, sedating medications held. EEG showed nonspecific slowing. MRI showed multiple small supra- and infratentorial diffusion abnormalities most consistent with infarcts spanning multiple vascular territories. Work up of stroke included echocardiogram which showed severe aortic and mitral stenosis as well as new wall motion abnormalities. Cardiology was consulted and CT surgery evaluation is pending to decide on further actions.   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 U/S negative for DVT. Blood cultures negative.  - Erythema worsening/stable despite many days of broad spectrum abx. I've consulted ID, Dr. Linus Salmons who will evaluate the patient, though suspicion is that this is all related to calciphylaxis. , significant risk factors for complications. No abscess. Continue vanc/zosyn for now, plan to discuss with ID for ongoing recommendations at discharge. May DC abx.  - Follow up biopsy results from 1/25 - Holding coumadin, continue heparin, holding Ca. Phos binder per nephrology. Pt not adherent.  - Pain  control improved with oxycodone, decreased dose with lethargy.  CVA: Seen on motion-degraded MRI. Multiple vascular distributions involved consistent with cardioembolic/septic emboli. Echo showed no thrombus. Carotid U/S limited by pt movement showed 1-39% stenosis of bilateral ICA's. 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  - Stroke team signed off, no new recommendations at this time.  - Continue statin - IV heparin, will require ongoing anticoagulation.   Acute systolic CHF with severe aortic stenosis, mitral stenosis: Echo with newly depressed LVEF to 35-40% from 60-65% with akinesis of mid-apicalanteroseptal myocardium. No wall motion abnormalities on last echo in Aug 2018. Also showed severe aortic stenosis, mitral stenosis. Pt has no chest pain. - Pending CT surgery evaluation/multidisciplinary meeting.    ESRD on hemodialysis with mild hyperkalemia and mild anemia - Nephrology providing HD on schedule.     Acute metabolic encephalopathy: Resolving. With waxing/waning course, suspect delirium worsened by sedating medications and CVA. EEG 1/23 nonspecific slowing without epileptiform discharges.  - Ammonia mildly elevated, gave lactulose, though had significant diarrhea and mental status improved. Holding for now.  - Hold sedating medications as able.   Above knee amputation of right lower extremity  - PT evaluation, eventual dispo to CIR anticipated.    Complete heart block: s/p PPM placement Nov 2018.  - No signs pacemaker is malfunctioning.      Atrial flutter: CHA2DS2-VASc Scoreis 5 - HR is well controlled, continue coreg.   - Heparin per pharmacy, stopped coumadin    Hyperlipidemia: - Continue statin.   Bipolar/depression:  - Continue depakote and lexapro since he's taking po.  Subtherapeutic INR: On arrival  DVT prophylaxis: IV heparin. Coumadin on hold. Code Status: Full Family Communication: Wife at the bedside Disposition Plan: Uncertain timing. CIR tentatively planned. SNF  being arranged as  a back up plan, though this is not preferred.  Consultants:   Nephrology  IR  Neurology  General surgery  Cardiology  Cardiothoracic surgery  ID  Procedures:   HD 1/21, 1/23, 1/25, 1/28  EEG 1/23:  The patient is poorly responsive during the recording.  During maximal wakefulness, there is a symmetric 7 Hz posterior dominant rhythm that attenuates with eye opening. This is admixed with diffuse 4-5 Hz theta and 2-3 Hz delta slowing of the waking background.  During drowsiness, there is an increase in theta slowing of the background.  Stage 2 sleep is not seen.  During the study, he exhibited occasional brief jerk of the extremities with no electrographic correlate.  There were no epileptiform discharges or electrographic seizures seen.   EKG lead was unremarkable.  Impression: This EEG is abnormal due to diffuse slowing of the waking background.  Echocardiogram 07/24/2017: - Left ventricle: The cavity size was normal. Wall thickness was   increased in a pattern of severe LVH. Systolic function was   moderately reduced. The estimated ejection fraction was in the   range of 35% to 40%. There is akinesis of the apical myocardium.   There is akinesis of the mid-apicalanteroseptal myocardium. - Aortic valve: Valve mobility was restricted. There was severe   stenosis. Valve area (VTI): 0.44 cm^2. Valve area (Vmax): 0.38   cm^2. Valve area (Vmean): 0.41 cm^2. - Mitral valve: Severely calcified annulus. The findings are   consistent with severe stenosis. Valve area by pressure   half-time: 1.84 cm^2. Valve area by continuity equation (using   LVOT flow): 0.95 cm^2. - Left atrium: The atrium was moderately dilated. - Right ventricle: The cavity size was moderately dilated. - Pulmonary arteries: PA peak pressure: 32 mm Hg (S).  Impressions: - Akinesis of the distal anteroseptal wall and apex with overall   moderate LV dysfunction; severe LVH; heavily calcified  aortic   valve with severe AS (mean gradient 60 mmHg); severe MAC with   severe MS (mean gradient 12 mmHg); moderate LAE; moderate RVE;   mild TR.  Antimicrobials:  Vancomycin, Zosyn  Subjective: Mental status fluctuating, was worse at HD but improved afterward. Complains of left leg pain with movement. Not able to completely comprehend complex medical decision making.  Denies chest pain. No fevers.  Objective: Vitals:   07/28/17 1640 07/28/17 2008 07/29/17 0350 07/29/17 0757  BP: 138/73 (!) 154/86 (!) 145/70 (!) 147/72  Pulse: 83 85 72 86  Resp: 18 20 18 14   Temp: 98.4 F (36.9 C) 98.5 F (36.9 C) 99.5 F (37.5 C) 98.1 F (36.7 C)  TempSrc: Oral Oral Oral Oral  SpO2: 98% 93% 95% 96%  Weight: 99 kg (218 lb 4.1 oz)     Height:        Intake/Output Summary (Last 24 hours) at 07/29/2017 1432 Last data filed at 07/29/2017 0900 Gross per 24 hour  Intake 240 ml  Output 3500 ml  Net -3260 ml   Filed Weights   07/25/17 1135 07/28/17 1226 07/28/17 1640  Weight: 96.9 kg (213 lb 10 oz) 102.4 kg (225 lb 12 oz) 99 kg (218 lb 4.1 oz)   Gen: Chronically ill-appearing 47 y.o. male alert in HD Pulm: Non-labored breathing. Clear bilaterally CV: RRR, III/VI holosystolic murmur throughout. No JVD. GI: Abdomen soft, nondistended, +BS.   Ext: RLE upper femur amputation site stable, normal appearing. Left shin with sharply demarcated hyperpigmented/purpuric lesions with surrounding tender blanchable erythema and induration without palpable abscess. Developing blood  blisters laterally. No purulence noted. (Pictures below) Overall worsening.  Skin: As above Neuro: Drowsy but oriented to person, place, time. Upper extremity ataxia noted bilaterally   Psych: Mood euthymic, affect congruent. Cognitive status at this time precludes ability to comprehend outcomes of different interventions in a complex medical situation.   07/28/2017   07/26/2017   07/25/2017   07/24/2017  CBC: Recent Labs   Lab 07/25/17 0649 07/26/17 0559 07/27/17 0624 07/28/17 0705 07/29/17 1244  WBC 7.6 8.6 9.8 10.6* 11.5*  HGB 10.8* 11.1* 11.3* 11.2* 11.2*  HCT 34.5* 35.6* 36.9* 36.5* 35.7*  MCV 87.1 87.5 89.6 89.2 88.1  PLT 376 346 352 330 147   Basic Metabolic Panel: Recent Labs  Lab 07/23/17 1519 07/24/17 0758 07/25/17 0649 07/26/17 0559 07/28/17 0705 07/29/17 1244  NA  --  136 137 134* 135 134*  K  --  3.7 4.2 3.7 4.6 4.2  CL  --  94* 94* 94* 93* 94*  CO2  --  25 24 26 25 24   GLUCOSE  --  131* 93 92 99 104*  BUN  --  27* 45* 26* 50* 34*  CREATININE  --  5.37* 7.15* 4.91* 8.02* 6.40*  CALCIUM  --  8.4* 8.6* 8.7* 8.4* 8.4*  PHOS 12.6*  --   --   --  7.8* 5.3*   Liver Function Tests: Recent Labs  Lab 07/28/17 0705 07/29/17 1244  ALBUMIN 2.4* 2.5*   Radiology Studies: No results found. Scheduled Meds: . amLODipine  10 mg Oral Daily  . carvedilol  25 mg Oral BID WC  . cinacalcet  90 mg Oral Q breakfast  . cloNIDine  0.2 mg Transdermal Q Tue  . divalproex  500 mg Oral Q12H  . escitalopram  10 mg Oral Daily  . ferric citrate  630 mg Oral TID WC  . gabapentin  100 mg Oral BID  . hydrALAZINE  100 mg Oral Q8H  . irbesartan  300 mg Oral Daily  . isosorbide dinitrate  10 mg Oral BID  . multivitamin  1 tablet Oral QHS  . nicotine  21 mg Transdermal Daily  . pravastatin  40 mg Oral Daily   Continuous Infusions: . heparin 2,550 Units/hr (07/28/17 1747)    LOS: 9 days   Time spent: 25 minutes.  Vance Gather, MD [name] Pager [phone]  If 7PM-7AM, please contact night-coverage [email or web address] Password [facility] 07/29/2017, 2:32 PM

## 2017-07-29 NOTE — Progress Notes (Signed)
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 Pt presented to the ED 1/19 with left lower extremity redness and pain. Pt became encephalopic and MRI on 1/23 revealed multiple small supra-and infratentorial diffusion abnormalities most consistent with infarcts spanning multiple vascular territories.    PT Comments    Pt continues to be confused during session which limits overall progression of therapy. Noted limited AROM of LLE due to tight hamstrings however pt unable to tolerate any stretching activity due to pain. Will continue to follow and progress as able per POC.    Follow Up Recommendations  CIR;Supervision/Assistance - 24 hour     Equipment Recommendations  None recommended by PT    Recommendations for Other Services       Precautions / Restrictions Precautions Precautions: Fall Restrictions Weight Bearing Restrictions: No    Mobility  Bed Mobility Overal bed mobility: Needs Assistance Bed Mobility: Supine to Sit     Supine to sit: Mod assist;+2 for physical assistance     General bed mobility comments: Pt pulled up into long sitting at beginning of session. Required +2 mod assist for scooting in preparation for A>P transfer.  Transfers Overall transfer level: Needs assistance   Transfers: Comptroller transfers: Mod assist;+2 physical assistance   General transfer comment: Bed pad and pillow under LLE utilized for scooting backwards into the recliner. VC's for hand placement on arm rests of chair when close enough to gain leverage for a good scoot back  Ambulation/Gait              General Gait Details: has not been ambulating outside of parallel bars - just recently got a prosthesis.   Stairs            Wheelchair Mobility    Modified Rankin (Stroke Patients Only)       Balance Overall balance assessment: Needs assistance Sitting-balance support: Bilateral upper extremity supported Sitting balance-Leahy Scale: Fair Sitting balance - Comments: supervision Postural control: Posterior lean                                  Cognition Arousal/Alertness: Lethargic Behavior During Therapy: Flat affect Overall Cognitive Status: Impaired/Different from baseline Area of Impairment: Safety/judgement;Awareness;Problem solving;Orientation;Memory;Attention;Following commands                 Orientation Level: Disoriented to;Situation Current Attention Level: Sustained Memory: Decreased short-term memory Following Commands: Follows one step commands inconsistently;Follows one step commands with increased time Safety/Judgement: Decreased awareness of safety;Decreased awareness of deficits Awareness: Intellectual Problem Solving: Slow processing;Difficulty sequencing General Comments: Pt asking son if he brought the pet duck in from outside at one point. Overall confused.       Exercises      General Comments        Pertinent Vitals/Pain Pain Assessment: Faces Faces Pain Scale: Hurts even more Pain Location: LLE with movement or trying to straighten knee - noted very tight hamstrings Pain Descriptors / Indicators: Cramping;Sharp Pain Intervention(s): Limited activity within patient's tolerance;Monitored during session;Repositioned    Home Living  Prior Function            PT Goals (current goals can now be found in the care plan section) Acute Rehab PT Goals Patient Stated Goal: None stated this session PT Goal Formulation: With patient/family Time For Goal Achievement:  08/05/17 Potential to Achieve Goals: Good Progress towards PT goals: Progressing toward goals    Frequency    Min 3X/week      PT Plan Current plan remains appropriate    Co-evaluation              AM-PAC PT "6 Clicks" Daily Activity  Outcome Measure  Difficulty turning over in bed (including adjusting bedclothes, sheets and blankets)?: A Lot Difficulty moving from lying on back to sitting on the side of the bed? : Unable Difficulty sitting down on and standing up from a chair with arms (e.g., wheelchair, bedside commode, etc,.)?: Unable Help needed moving to and from a bed to chair (including a wheelchair)?: Total Help needed walking in hospital room?: Total Help needed climbing 3-5 steps with a railing? : Total 6 Click Score: 7    End of Session Equipment Utilized During Treatment: Gait belt Activity Tolerance: Patient tolerated treatment well Patient left: in chair;with call bell/phone within reach;with family/visitor present Nurse Communication: Mobility status PT Visit Diagnosis: Other abnormalities of gait and mobility (R26.89)     Time: 1415-1436 PT Time Calculation (min) (ACUTE ONLY): 21 min  Charges:  $Therapeutic Activity: 8-22 mins                    G Codes:       Rolinda Roan, PT, DPT Acute Rehabilitation Services Pager: [phone] 07/29/2017, 2:49 PM

## 2017-07-29 NOTE — Progress Notes (Signed)
ANTICOAGULATION CONSULT NOTE - Follow Up Consult  Pharmacy Consult:  Heparin Indication:  Aflutter  Patient Measurements: Height: 5\' 10"  (177.8 cm) Weight: 218 lb 4.1 oz (99 kg) IBW/kg (Calculated) : 73  Heparin dosing weight: 94 kg  Vital Signs: Temp: 98.1 F (36.7 C) (01/29 0757) Temp Source: Oral (01/29 0757) BP: 147/72 (01/29 0757) Pulse Rate: 86 (01/29 0757)  Labs: Recent Labs    07/27/17 0624 07/28/17 0705 07/28/17 1150 07/29/17 1244  HGB 11.3* 11.2*  --  11.2*  HCT 36.9* 36.5*  --  35.7*  PLT 352 330  --  314  LABPROT 16.0* 16.0*  --  16.1*  INR 1.29 1.29  --  1.31  HEPARINUNFRC 0.45 0.79* 0.39 0.24*  CREATININE  --  8.02*  --  6.40*    Estimated Creatinine Clearance: 17.2 mL/min (A) (by C-G formula based on SCr of 6.4 mg/dL (H)).   Assessment: 16 YOM who was on Coumadin PTA for history of AFib.  Patient was transitioned to IV heparin for concern of Coumadin-induced skin necrosis vs calciphylaxis. Negative for DVT. S/p skin biopsy on 07/25/17.  Heparin level is now slightly below goal at 0.24units/mL.Marland Kitchen Patient was refusing labs earlier today and level was drawn this afternoon. No bleeding noted.   Goal of Therapy:  Heparin level 0.3-0.5 units/ml (concern for new CVA vs. septic emboli)  Monitor platelets by anticoagulation protocol: Yes    Plan:  Increase heparin gtt slightly to 2600 units/hr for now Monitor daily heparin level and CBC, for s/sx bleeding   Jaymie Misch D. Yeva Bissette, PharmD, BCPS Clinical Pharmacist Clinical Phone for 07/29/2017 until 3:30pm: [phone] If after 3:30pm, please call main pharmacy at [phone] 07/29/2017 2:50 PM

## 2017-07-30 ENCOUNTER — Encounter (HOSPITAL_COMMUNITY)
Admission: EM | Disposition: A | Discharge status: Rehabilitation Facility | Payer: Self-pay | Source: Home / Self Care | Attending: Family Medicine

## 2017-07-30 HISTORY — PX: RIGHT/LEFT HEART CATH AND CORONARY ANGIOGRAPHY: CATH118266

## 2017-07-30 LAB — CBC
HCT: 35.3 % — ABNORMAL LOW (ref 39.0–52.0)
HEMOGLOBIN: 11.1 g/dL — AB (ref 13.0–17.0)
MCH: 27.8 pg (ref 26.0–34.0)
MCHC: 31.4 g/dL (ref 30.0–36.0)
MCV: 88.3 fL (ref 78.0–100.0)
PLATELETS: 346 10*3/uL (ref 150–400)
RBC: 4 MIL/uL — AB (ref 4.22–5.81)
RDW: 18.7 % — ABNORMAL HIGH (ref 11.5–15.5)
WBC: 11 10*3/uL — AB (ref 4.0–10.5)

## 2017-07-30 LAB — POCT I-STAT 3, VENOUS BLOOD GAS (G3P V)
ACID-BASE EXCESS: 4 mmol/L — AB (ref 0.0–2.0)
BICARBONATE: 29.6 mmol/L — AB (ref 20.0–28.0)
O2 SAT: 68 %
PO2 VEN: 36 mmHg (ref 32.0–45.0)
TCO2: 31 mmol/L (ref 22–32)
pCO2, Ven: 46.4 mmHg (ref 44.0–60.0)
pH, Ven: 7.413 (ref 7.250–7.430)

## 2017-07-30 LAB — GLUCOSE, CAPILLARY
GLUCOSE-CAPILLARY: 110 mg/dL — AB (ref 65–99)
Glucose-Capillary: 78 mg/dL (ref 65–99)
Glucose-Capillary: 81 mg/dL (ref 65–99)

## 2017-07-30 LAB — POCT I-STAT 3, ART BLOOD GAS (G3+)
ACID-BASE EXCESS: 2 mmol/L (ref 0.0–2.0)
Bicarbonate: 27.3 mmol/L (ref 20.0–28.0)
O2 SAT: 97 %
TCO2: 29 mmol/L (ref 22–32)
pCO2 arterial: 45.1 mmHg (ref 32.0–48.0)
pH, Arterial: 7.391 (ref 7.350–7.450)
pO2, Arterial: 90 mmHg (ref 83.0–108.0)

## 2017-07-30 LAB — RENAL FUNCTION PANEL
ANION GAP: 18 — AB (ref 5–15)
Albumin: 2.4 g/dL — ABNORMAL LOW (ref 3.5–5.0)
BUN: 44 mg/dL — ABNORMAL HIGH (ref 6–20)
CHLORIDE: 96 mmol/L — AB (ref 101–111)
CO2: 22 mmol/L (ref 22–32)
Calcium: 8.4 mg/dL — ABNORMAL LOW (ref 8.9–10.3)
Creatinine, Ser: 7.22 mg/dL — ABNORMAL HIGH (ref 0.61–1.24)
GFR calc non Af Amer: 8 mL/min — ABNORMAL LOW (ref >60)
GFR, EST AFRICAN AMERICAN: 9 mL/min — AB (ref >60)
GLUCOSE: 110 mg/dL — AB (ref 65–99)
POTASSIUM: 4.2 mmol/L (ref 3.5–5.1)
Phosphorus: 5.9 mg/dL — ABNORMAL HIGH (ref 2.5–4.6)
Sodium: 136 mmol/L (ref 135–145)

## 2017-07-30 LAB — POCT ACTIVATED CLOTTING TIME: Activated Clotting Time: 142 seconds

## 2017-07-30 LAB — PROTIME-INR
INR: 1.33
PROTHROMBIN TIME: 16.4 s — AB (ref 11.4–15.2)

## 2017-07-30 LAB — HEPARIN LEVEL (UNFRACTIONATED): Heparin Unfractionated: 0.23 IU/mL — ABNORMAL LOW (ref 0.30–0.70)

## 2017-07-30 SURGERY — RIGHT/LEFT HEART CATH AND CORONARY ANGIOGRAPHY
Anesthesia: LOCAL

## 2017-07-30 MED ORDER — PENTAFLUOROPROP-TETRAFLUOROETH EX AERO: 1.0000 "application " | INHALATION_SPRAY | CUTANEOUS | Status: DC | PRN

## 2017-07-30 MED ORDER — SODIUM CHLORIDE 0.9 % IV SOLN: 100.0000 mL | INTRAVENOUS | Status: DC | PRN

## 2017-07-30 MED ORDER — HEPARIN (PORCINE) IN NACL 100-0.45 UNIT/ML-% IJ SOLN
2900.0000 [IU]/h | INTRAMUSCULAR | Status: DC
  Administered 2017-07-31: 02:00:00 2700 [IU]/h via INTRAVENOUS
  Administered 2017-07-31: 21:00:00 2900 [IU]/h via INTRAVENOUS
  Administered 2017-07-31: 2700 [IU]/h via INTRAVENOUS
  Administered 2017-08-01 – 2017-08-04 (×9): 2850 [IU]/h via INTRAVENOUS
  Administered 2017-08-05: 2900 [IU]/h via INTRAVENOUS
  Filled 2017-07-30 (×14): qty 250

## 2017-07-30 MED ORDER — IOPAMIDOL (ISOVUE-370) INJECTION 76%
INTRAVENOUS | Status: DC | PRN
Start: 2017-07-30 — End: 2017-07-30
  Administered 2017-07-30: 85 mL via INTRA_ARTERIAL

## 2017-07-30 MED ORDER — SODIUM CHLORIDE 0.9% FLUSH
3.0000 mL | Freq: Two times a day (BID) | INTRAVENOUS | Status: DC
  Administered 2017-07-31 – 2017-08-04 (×4): 3 mL via INTRAVENOUS

## 2017-07-30 MED ORDER — OXYCODONE HCL 5 MG PO TABS
10.0000 mg | ORAL_TABLET | Freq: Once | ORAL | Status: AC
  Administered 2017-07-30: 10 mg via ORAL

## 2017-07-30 MED ORDER — ALTEPLASE 2 MG IJ SOLR
2.0000 mg | Freq: Once | INTRAMUSCULAR | Status: DC | PRN
  Filled 2017-07-30: qty 2

## 2017-07-30 MED ORDER — IOPAMIDOL (ISOVUE-370) INJECTION 76%
INTRAVENOUS | Status: AC
  Filled 2017-07-30: qty 100

## 2017-07-30 MED ORDER — HEPARIN SODIUM (PORCINE) 1000 UNIT/ML DIALYSIS
20.0000 [IU]/kg | INTRAMUSCULAR | Status: DC | PRN
  Filled 2017-07-30: qty 2

## 2017-07-30 MED ORDER — FENTANYL CITRATE (PF) 100 MCG/2ML IJ SOLN
25.0000 ug | Freq: Once | INTRAMUSCULAR | Status: AC
  Administered 2017-07-31: 25 ug via INTRAVENOUS
  Filled 2017-07-30: qty 2

## 2017-07-30 MED ORDER — SODIUM CHLORIDE 0.9 % IV SOLN: 250.0000 mL | INTRAVENOUS | Status: DC | PRN

## 2017-07-30 MED ORDER — HEPARIN (PORCINE) IN NACL 2-0.9 UNIT/ML-% IJ SOLN
INTRAMUSCULAR | Status: AC | PRN
  Administered 2017-07-30: 1000 mL via INTRA_ARTERIAL

## 2017-07-30 MED ORDER — MIDAZOLAM HCL 2 MG/2ML IJ SOLN
INTRAMUSCULAR | Status: DC | PRN
Start: 2017-07-30 — End: 2017-07-30
  Administered 2017-07-30 (×2): 1 mg via INTRAVENOUS

## 2017-07-30 MED ORDER — SODIUM THIOSULFATE 25 % IV SOLN
25.0000 g | INTRAVENOUS | Status: DC
  Administered 2017-08-01 – 2017-08-04 (×2): 25 g via INTRAVENOUS
  Filled 2017-07-30 (×4): qty 100

## 2017-07-30 MED ORDER — MIDAZOLAM HCL 2 MG/2ML IJ SOLN
INTRAMUSCULAR | Status: AC
  Filled 2017-07-30: qty 2

## 2017-07-30 MED ORDER — HEPARIN (PORCINE) IN NACL 2-0.9 UNIT/ML-% IJ SOLN
INTRAMUSCULAR | Status: AC
  Filled 2017-07-30: qty 1000

## 2017-07-30 MED ORDER — LIDOCAINE-PRILOCAINE 2.5-2.5 % EX CREA: 1.0000 "application " | TOPICAL_CREAM | CUTANEOUS | Status: DC | PRN

## 2017-07-30 MED ORDER — SODIUM CHLORIDE 0.9 % IV SOLN
125.0000 mg | INTRAVENOUS | Status: DC
  Administered 2017-08-01 – 2017-08-04 (×2): 125 mg via INTRAVENOUS
  Filled 2017-07-30 (×4): qty 10

## 2017-07-30 MED ORDER — LIDOCAINE HCL (PF) 1 % IJ SOLN: 5.0000 mL | INTRAMUSCULAR | Status: DC | PRN

## 2017-07-30 MED ORDER — FENTANYL CITRATE (PF) 100 MCG/2ML IJ SOLN
25.0000 ug | Freq: Once | INTRAMUSCULAR | Status: AC
  Administered 2017-07-30: 25 ug via INTRAVENOUS
  Filled 2017-07-30: qty 2

## 2017-07-30 MED ORDER — HEPARIN SODIUM (PORCINE) 1000 UNIT/ML DIALYSIS
1000.0000 [IU] | INTRAMUSCULAR | Status: DC | PRN
  Filled 2017-07-30: qty 1

## 2017-07-30 MED ORDER — LIDOCAINE HCL (PF) 1 % IJ SOLN
INTRAMUSCULAR | Status: DC | PRN
  Administered 2017-07-30: 30 mL

## 2017-07-30 MED ORDER — OXYCODONE HCL 5 MG PO TABS
ORAL_TABLET | ORAL | Status: AC
  Filled 2017-07-30: qty 2

## 2017-07-30 MED ORDER — FENTANYL CITRATE (PF) 100 MCG/2ML IJ SOLN
INTRAMUSCULAR | Status: DC | PRN
  Administered 2017-07-30 (×2): 25 ug via INTRAVENOUS

## 2017-07-30 MED ORDER — FENTANYL CITRATE (PF) 100 MCG/2ML IJ SOLN
INTRAMUSCULAR | Status: AC
  Filled 2017-07-30: qty 2

## 2017-07-30 MED ORDER — ASPIRIN 81 MG PO CHEW
81.0000 mg | CHEWABLE_TABLET | Freq: Every day | ORAL | Status: DC
  Administered 2017-07-31 – 2017-08-05 (×6): 81 mg via ORAL
  Filled 2017-07-30 (×6): qty 1

## 2017-07-30 MED ORDER — LIDOCAINE HCL 1 % IJ SOLN
INTRAMUSCULAR | Status: AC
  Filled 2017-07-30: qty 20

## 2017-07-30 MED ORDER — SODIUM CHLORIDE 0.9% FLUSH: 3.0000 mL | INTRAVENOUS | Status: DC | PRN

## 2017-07-30 MED ORDER — HEPARIN SODIUM (PORCINE) 1000 UNIT/ML DIALYSIS: 1000.0000 [IU] | INTRAMUSCULAR | Status: DC | PRN

## 2017-07-30 MED ORDER — SEVELAMER CARBONATE 800 MG PO TABS
2400.0000 mg | ORAL_TABLET | Freq: Three times a day (TID) | ORAL | Status: DC
  Administered 2017-07-31 – 2017-08-05 (×14): 2400 mg via ORAL
  Filled 2017-07-30 (×16): qty 3

## 2017-07-30 SURGICAL SUPPLY — 12 items
CATH INFINITI MULTIPACK ST 5F (CATHETERS) ×1 IMPLANT
CATH SWAN GANZ 7F STRAIGHT (CATHETERS) ×1 IMPLANT
COVER PRB 48X5XTLSCP FOLD TPE (BAG) IMPLANT
COVER PROBE 5X48 (BAG) ×2
HOVERMATT SINGLE USE (MISCELLANEOUS) ×1 IMPLANT
KIT HEART LEFT (KITS) ×2 IMPLANT
PACK CARDIAC CATHETERIZATION (CUSTOM PROCEDURE TRAY) ×2 IMPLANT
SHEATH PINNACLE 5F 10CM (SHEATH) ×1 IMPLANT
SHEATH PINNACLE 7F 10CM (SHEATH) ×1 IMPLANT
TRANSDUCER W/STOPCOCK (MISCELLANEOUS) ×2 IMPLANT
WIRE EMERALD 3MM-J .035X150CM (WIRE) ×1 IMPLANT
WIRE EMERALD ST .035X150CM (WIRE) ×1 IMPLANT

## 2017-07-30 NOTE — H&P (View-Only) (Signed)
Progress Note  Patient Name: Hayden [name] Date of Encounter: 07/30/2017  Primary Cardiologist: Dr Ellyn Hack  Subjective   Denies CP or dyspnea; seen in dialysis  Inpatient Medications    Scheduled Meds: . amLODipine  10 mg Oral Daily  . carvedilol  25 mg Oral BID WC  . cinacalcet  90 mg Oral Q breakfast  . cloNIDine  0.2 mg Transdermal Q Tue  . divalproex  500 mg Oral Q12H  . escitalopram  10 mg Oral Daily  . ferric citrate  630 mg Oral TID WC  . gabapentin  100 mg Oral BID  . hydrALAZINE  100 mg Oral Q8H  . irbesartan  300 mg Oral Daily  . isosorbide dinitrate  10 mg Oral BID  . multivitamin  1 tablet Oral QHS  . nicotine  21 mg Transdermal Daily  . pravastatin  40 mg Oral Daily  . sodium chloride flush  3 mL Intravenous Q12H   Continuous Infusions: . sodium chloride    . sodium chloride    . heparin 2,600 Units/hr (07/29/17 1530)   PRN Meds: sodium chloride, nitroGLYCERIN, oxyCODONE, senna-docusate, sodium chloride flush   Vital Signs    Vitals:   07/29/17 1300 07/29/17 1457 07/29/17 1944 07/30/17 0611  BP: 128/60 117/63 134/63 140/70  Pulse: 77 66 75 65  Resp: 16 16 18 18   Temp: 98.5 F (36.9 C) 98.4 F (36.9 C) 98.3 F (36.8 C) 98.2 F (36.8 C)  TempSrc: Oral Oral Oral Oral  SpO2: 94% 94% 95% 96%  Weight:      Height:        Intake/Output Summary (Last 24 hours) at 07/30/2017 0900 Last data filed at 07/30/2017 0700 Gross per 24 hour  Intake 970 ml  Output -  Net 970 ml   Filed Weights   07/25/17 1135 07/28/17 1226 07/28/17 1640  Weight: 213 lb 10 oz (96.9 kg) 225 lb 12 oz (102.4 kg) 218 lb 4.1 oz (99 kg)    Physical Exam   GEN: WD, obese, chronically ill appearing; NAD Neck: no JVD Cardiac: RRR, 3/6 systolic murmur, S2 diminished Respiratory: CTA GI: Soft, NT/ND MS: s/p right AKA, dressing in place LLE Neuro:  no focal findings   Labs    Chemistry Recent Labs  Lab 07/28/17 0705 07/29/17 1244 07/30/17 0558  NA 135 134* 136    K 4.6 4.2 4.2  CL 93* 94* 96*  CO2 25 24 22   GLUCOSE 99 104* 110*  BUN 50* 34* 44*  CREATININE 8.02* 6.40* 7.22*  CALCIUM 8.4* 8.4* 8.4*  ALBUMIN 2.4* 2.5* 2.4*  GFRNONAA 7* 9* 8*  GFRAA 8* 11* 9*  ANIONGAP 17* 16* 18*     Hematology Recent Labs  Lab 07/28/17 0705 07/29/17 1244 07/30/17 0558  WBC 10.6* 11.5* 11.0*  RBC 4.09* 4.05* 4.00*  HGB 11.2* 11.2* 11.1*  HCT 36.5* 35.7* 35.3*  MCV 89.2 88.1 88.3  MCH 27.4 27.7 27.8  MCHC 30.7 31.4 31.4  RDW 19.0* 18.9* 18.7*  PLT 330 314 346      Patient Profile     47 year old male with past medical history of diabetes mellitus, prior CVA, seizures, prior partial nephrectomy for kidney cancer, end-stage renal disease, prior right AKA, hypertension, hyperlipidemia, bipolar disorder, history of atrial flutter, prior pacemaker for evaluation of aortic stenosis and cardiomyopathy.  Admitted with lower extremity cellulitis.  Had decreased mental status transiently and MRI showed multiple small infarcts.  Echocardiogram was performed and showed ejection fraction 35-40%  with akinesis of the apical myocardium, severe aortic stenosis with mean gradient 60 mmHg, severe mitral annular calcification with severe mitral stenosis with with mean gradient 12 mmHg, moderate left atrial enlargement and moderate right ventricular enlargement.    Assessment & Plan    1. Aortic stenosis/mitral stenosis-Echocardiogram shows newly reduced LV function with wall motion abnormality suggesting CAD (vs apical abnormality due to pacing).  Given multiple comorbidities (end-stage renal disease dialysis dependent, prior CVA, prior right AKA, recent cellulitis left lower extremity) unclear if he is a candidate for AVR/MVR/CABG; presently being evaluated for possible PCI/TAVR though would not address mitral valve. For R and L cath today. Also for CTA. If not a candidate for either would need hospice eval.  2. History of atrial flutter-given CVA he will require  long-term anticoagulation.  Continue heparin until results of skin biopsy known.  Continue carvedilol for rate control if atrial flutter recurs. 3. Prior pacemaker 4. Probable ischemic cardiomyopathy-continue beta-blocker.  Patient also on an ARB. Add ASA. 5. End-stage renal disease dialysis dependent-being dialyzed today. 6. Possible lower extremity cellulitis-antibiotics per primary service.  Skin biopsy results pending (question calciphylaxis). 7. Tobacco abuse-patient counseled on discontinuing. 8. Hypertension-continue present blood pressure medications.     For questions or updates, please contact [place] Please consult [email or web address] for contact info under Cardiology/STEMI.      Signed, Kirk Ruths, MD  07/30/2017, 9:00 AM

## 2017-07-30 NOTE — Progress Notes (Addendum)
Site area: rt groin fa and fv sheaths pulled and pressure held by [employer] Prior to Removal:  Level 0 Pressure Applied For:  30 minutes Manual:   yes Patient Status During Pull:  stable Post Pull Site:  Level  0 Post Pull Instructions Given:  yes Post Pull Pulses Present: rt AKA Dressing Applied:  Gauze and tegaderm Bedrest begins @ 1730 Comments:

## 2017-07-30 NOTE — Progress Notes (Signed)
[facility] Progress Note   Subjective:  HD today on schedule Weight down about 6 kg from max weight 106.8  EDW 98 Post HD weight pending For heart cath today Leg pain biggest issue  Objective Vitals:   07/30/17 0930 07/30/17 1000 07/30/17 1030 07/30/17 1100  BP: 128/69 132/74 (!) 145/78 (!) 153/84  Pulse: 69 61 68 65  Resp: 18 17 18 18   Temp:      TempSrc:      SpO2:      Weight:      Height:       Physical Exam Obese bearded WM, seen in HD NAD No JVD Lungs clear 3/6 murmur LSB heard in neck and out to apex General: Obese male, sitting up in bed, NAD Abdomen is obese, no focal tenderness. + BS Extremities: s/p R AKA; L LE with diffuse erythema, necrotic areas to skin, covered by dressing  Dialysis Access: L AVF +bruit cannulated   Recent Labs  Lab 07/28/17 0705 07/29/17 1244 07/30/17 0558  NA 135 134* 136  K 4.6 4.2 4.2  CL 93* 94* 96*  CO2 25 24 22   GLUCOSE 99 104* 110*  BUN 50* 34* 44*  CREATININE 8.02* 6.40* 7.22*  CALCIUM 8.4* 8.4* 8.4*  PHOS 7.8* 5.3* 5.9*    Recent Labs  Lab 07/26/17 0559 07/27/17 0624 07/28/17 0705 07/29/17 1244 07/30/17 0558  WBC 8.6 9.8 10.6* 11.5* 11.0*  HGB 11.1* 11.3* 11.2* 11.2* 11.1*  HCT 35.6* 36.9* 36.5* 35.7* 35.3*  MCV 87.5 89.6 89.2 88.1 88.3  PLT 346 352 330 314 346   Blood Culture    Component Value Date/Time   SDES BLOOD RIGHT HAND 07/19/2017 2330   SPECREQUEST  07/19/2017 2330    BOTTLES DRAWN AEROBIC ONLY Blood Culture adequate volume   CULT NO GROWTH 5 DAYS 07/19/2017 2330   REPTSTATUS 07/25/2017 FINAL 07/19/2017 2330     Recent Labs  Lab 07/28/17 1744 07/28/17 2046 07/29/17 0623 07/29/17 1129 07/29/17 1558  GLUCAP 129* 124* 107* 125* 130*    Lab Results  Component Value Date   INR 1.33 07/30/2017   INR 1.31 07/29/2017   INR 1.29 07/28/2017    Biopsy LLE: 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 sodium chloride    . sodium  chloride    . sodium chloride    . heparin 2,600 Units/hr (07/29/17 1530)   . amLODipine  10 mg Oral Daily  . aspirin  81 mg Oral Daily  . carvedilol  25 mg Oral BID WC  . cinacalcet  90 mg Oral Q breakfast  . cloNIDine  0.2 mg Transdermal Q Tue  . divalproex  500 mg Oral Q12H  . escitalopram  10 mg Oral Daily  . ferric citrate  630 mg Oral TID WC  . gabapentin  100 mg Oral BID  . hydrALAZINE  100 mg Oral Q8H  . irbesartan  300 mg Oral Daily  . isosorbide dinitrate  10 mg Oral BID  . multivitamin  1 tablet Oral QHS  . nicotine  21 mg Transdermal Daily  . pravastatin  40 mg Oral Daily  . 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 On auryxia, will add renvela powder. Add Na thiosulfate with HD. Discussed with pt again today the absolute necessity of taking binders correctly.  2. ESRD HD MWF. HD today. Weight down, edema better (in the hospital)  3. CM/CHF/MS/severe AS- newly reduced LV fx and WMA's worrisome for ischemia. Cards evaluating. CTS following for possible TAVR. For R/L heart cath today and for CTA. 4. Bipolar 5. HTNcontrolled here onmult bp meds.  6. Anemiahgb 11.2 stable 9% sat 1/24 ferritin 1427 Replete with HD.  7. DM2 8. PVD- right aka 9. Aflutter- on heparin  - will need long term anticoagulation 10. Nutrition - renal diet. Low albumin. Eating food brought in from outside and not getting binders with this  Timing  of outside food and  binders an issue 11. CVA multiple bilateral on MRI. Suspect cardioembolic (from aflutter). 1-39% bilat ICA stenosis, no thrombus on ECHO. 12. Tobacco abuse  Jamal Maes, MD [place] Pager 07/30/2017, 12:17 PM

## 2017-07-30 NOTE — Progress Notes (Signed)
Progress Note  Patient Name: Hayden [name] Date of Encounter: 07/30/2017  Primary Cardiologist: Dr Ellyn Hack  Subjective   Denies CP or dyspnea; seen in dialysis  Inpatient Medications    Scheduled Meds: . amLODipine  10 mg Oral Daily  . carvedilol  25 mg Oral BID WC  . cinacalcet  90 mg Oral Q breakfast  . cloNIDine  0.2 mg Transdermal Q Tue  . divalproex  500 mg Oral Q12H  . escitalopram  10 mg Oral Daily  . ferric citrate  630 mg Oral TID WC  . gabapentin  100 mg Oral BID  . hydrALAZINE  100 mg Oral Q8H  . irbesartan  300 mg Oral Daily  . isosorbide dinitrate  10 mg Oral BID  . multivitamin  1 tablet Oral QHS  . nicotine  21 mg Transdermal Daily  . pravastatin  40 mg Oral Daily  . sodium chloride flush  3 mL Intravenous Q12H   Continuous Infusions: . sodium chloride    . sodium chloride    . heparin 2,600 Units/hr (07/29/17 1530)   PRN Meds: sodium chloride, nitroGLYCERIN, oxyCODONE, senna-docusate, sodium chloride flush   Vital Signs    Vitals:   07/29/17 1300 07/29/17 1457 07/29/17 1944 07/30/17 0611  BP: 128/60 117/63 134/63 140/70  Pulse: 77 66 75 65  Resp: 16 16 18 18   Temp: 98.5 F (36.9 C) 98.4 F (36.9 C) 98.3 F (36.8 C) 98.2 F (36.8 C)  TempSrc: Oral Oral Oral Oral  SpO2: 94% 94% 95% 96%  Weight:      Height:        Intake/Output Summary (Last 24 hours) at 07/30/2017 0900 Last data filed at 07/30/2017 0700 Gross per 24 hour  Intake 970 ml  Output -  Net 970 ml   Filed Weights   07/25/17 1135 07/28/17 1226 07/28/17 1640  Weight: 213 lb 10 oz (96.9 kg) 225 lb 12 oz (102.4 kg) 218 lb 4.1 oz (99 kg)    Physical Exam   GEN: WD, obese, chronically ill appearing; NAD Neck: no JVD Cardiac: RRR, 3/6 systolic murmur, S2 diminished Respiratory: CTA GI: Soft, NT/ND MS: s/p right AKA, dressing in place LLE Neuro:  no focal findings   Labs    Chemistry Recent Labs  Lab 07/28/17 0705 07/29/17 1244 07/30/17 0558  NA 135 134* 136    K 4.6 4.2 4.2  CL 93* 94* 96*  CO2 25 24 22   GLUCOSE 99 104* 110*  BUN 50* 34* 44*  CREATININE 8.02* 6.40* 7.22*  CALCIUM 8.4* 8.4* 8.4*  ALBUMIN 2.4* 2.5* 2.4*  GFRNONAA 7* 9* 8*  GFRAA 8* 11* 9*  ANIONGAP 17* 16* 18*     Hematology Recent Labs  Lab 07/28/17 0705 07/29/17 1244 07/30/17 0558  WBC 10.6* 11.5* 11.0*  RBC 4.09* 4.05* 4.00*  HGB 11.2* 11.2* 11.1*  HCT 36.5* 35.7* 35.3*  MCV 89.2 88.1 88.3  MCH 27.4 27.7 27.8  MCHC 30.7 31.4 31.4  RDW 19.0* 18.9* 18.7*  PLT 330 314 346      Patient Profile     47 year old male with past medical history of diabetes mellitus, prior CVA, seizures, prior partial nephrectomy for kidney cancer, end-stage renal disease, prior right AKA, hypertension, hyperlipidemia, bipolar disorder, history of atrial flutter, prior pacemaker for evaluation of aortic stenosis and cardiomyopathy.  Admitted with lower extremity cellulitis.  Had decreased mental status transiently and MRI showed multiple small infarcts.  Echocardiogram was performed and showed ejection fraction 35-40%  with akinesis of the apical myocardium, severe aortic stenosis with mean gradient 60 mmHg, severe mitral annular calcification with severe mitral stenosis with with mean gradient 12 mmHg, moderate left atrial enlargement and moderate right ventricular enlargement.    Assessment & Plan    1. Aortic stenosis/mitral stenosis-Echocardiogram shows newly reduced LV function with wall motion abnormality suggesting CAD (vs apical abnormality due to pacing).  Given multiple comorbidities (end-stage renal disease dialysis dependent, prior CVA, prior right AKA, recent cellulitis left lower extremity) unclear if he is a candidate for AVR/MVR/CABG; presently being evaluated for possible PCI/TAVR though would not address mitral valve. For R and L cath today. Also for CTA. If not a candidate for either would need hospice eval.  2. History of atrial flutter-given CVA he will require  long-term anticoagulation.  Continue heparin until results of skin biopsy known.  Continue carvedilol for rate control if atrial flutter recurs. 3. Prior pacemaker 4. Probable ischemic cardiomyopathy-continue beta-blocker.  Patient also on an ARB. Add ASA. 5. End-stage renal disease dialysis dependent-being dialyzed today. 6. Possible lower extremity cellulitis-antibiotics per primary service.  Skin biopsy results pending (question calciphylaxis). 7. Tobacco abuse-patient counseled on discontinuing. 8. Hypertension-continue present blood pressure medications.     For questions or updates, please contact [place] Please consult [email or web address] for contact info under Cardiology/STEMI.      Signed, Kirk Ruths, MD  07/30/2017, 9:00 AM

## 2017-07-30 NOTE — Interval H&P Note (Signed)
History and Physical Interval Note:  07/30/2017 3:46 PM  Hayden [name]  has presented today for surgery, with the diagnosis of Aortic Stenosis  The various methods of treatment have been discussed with the patient and family. After consideration of risks, benefits and other options for treatment, the patient has consented to  Procedure(s): RIGHT/LEFT HEART CATH AND CORONARY ANGIOGRAPHY (N/A) as a surgical intervention .  The patient's history has been reviewed, patient examined, no change in status, stable for surgery.  I have reviewed the patient's chart and labs.  Questions were answered to the patient's satisfaction.     Col[facility] 07/30/2017 3:46 PM

## 2017-07-30 NOTE — Progress Notes (Signed)
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 XR showed calcified vessels without evidence of emphysema or fracture. Vancomycin and zosyn were given, nephrology consulted for HD while inpatient, and he was admitted. Fistulogram performed 1/22 showed widely patent AVF circuit. The patient grew more lethargic following the procedure. Neurology was consulted, sedating medications held. EEG showed nonspecific slowing. MRI showed multiple small supra- and infratentorial diffusion abnormalities most consistent with infarcts spanning multiple vascular territories. Work up of stroke included echocardiogram which showed severe aortic and mitral stenosis as well as new wall motion abnormalities. Cardiology was consulted and CT surgery evaluation is pending to decide on further actions.      Assessment & Plan:   Active Problems:   History of partial Right nephrectomy for renal mass (2008)   End stage renal disease on dialysis [facility])   History of CVA (cerebrovascular accident)   Atherosclerosis of native arteries of the extremities with ulceration ([place])   Above knee amputation of right lower extremity (HCC)   ESRD on hemodialysis ([place])   Complete heart block ([place]) - s/p PPM   Atrial flutter ([place]); CHA2DS2Vasc -5, on warfarin   Hyperlipidemia with target low density lipoprotein (LDL) cholesterol less than 70 mg/dL   Cellulitis and abscess of left lower extremity   Severe aortic stenosis   Assessment & Plan: 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 U/S negative for DVT. Blood cultures negative.  - Erythema worsening/stable despite many days of broad spectrum abx.  -this is all related to calciphylaxis.  - Follow up biopsy results from 1/25, showed VASCULITIS WITH CALCIFICATION MOST CONSISTENT WITH CALCIPHYLAXIS. - Holding coumadin, continue heparin, holding Ca. Phos binder per nephrology. Pt not adherent.  - Pain control improved with oxycodone, decreased dose with lethargy. -ID consulted. They evaluated patient on 1-28 recommend to stop antibiotics. Diagnosis consistent with calciphylaxis.  -started on renvela and thiosulfate. Appreciate Dr Lorrene Reid help.   CVA: Seen on motion-degraded MRI. Multiple vascular distributions involved consistent with cardioembolic/septic emboli. Echo showed no thrombus. Carotid U/S limited by pt movement showed 1-39% stenosis of bilateral ICA's. 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  - Stroke team signed off, no new recommendations at this time.  - Continue statin - IV heparin, will require ongoing anticoagulation.   Acute systolic CHF with severe aortic stenosis, mitral stenosis: Echo with newly depressed LVEF to 35-40% from 60-65% with akinesis of mid-apicalanteroseptal myocardium. No wall motion abnormalities on last echo in Aug 2018. Also showed severe aortic stenosis, mitral stenosis. Pt has no chest pain. - Pending CT surgery evaluation/multidisciplinary meeting.   -for Cath and CT   ESRD on hemodialysiswith mild hyperkalemia and mild anemia - Nephrology providing HD on schedule.   Acute metabolic encephalopathy: Resolving. With waxing/waning course, suspect delirium worsened by sedating medications and CVA. EEG 1/23 nonspecific slowing without epileptiform discharges.  - Ammonia mildly elevated, gave lactulose, though had significant diarrhea and mental status improved. Holding for now.  - Hold sedating medications as able.    -oxycodone  dose decreased.   Above knee amputation of right lower extremity - PT evaluation, eventual dispo to CIR anticipated.  Complete heart block: s/p PPM placement Nov 2018.  - No signs pacemaker is malfunctioning.   Atrial flutter: CHA2DS2-VASc Scoreis 5 - continue coreg. - Heparin per pharmacy, stopped coumadin  Hyperlipidemia: - Continue statin.   Bipolar/depression:  - Continue depakote and lexapro     DVT prophylaxis: IV heparin.  Code Status: full code.  Family Communication: care discussed with patient.  Disposition Plan: to be determine. CIR vs SNF  Consultants:   Nephrology  IR  Neurology  General surgery  Cardiology  Cardiothoracic surgery  ID       Procedures:   HD Echocardiogram 07/24/2017: Akinesis of the distal anteroseptal wall and apex with overall moderate LV dysfunction; severe LVH; heavily calcified aortic valve with severe AS (mean gradient 60 mmHg); severe MAC with severe MS (mean gradient 12 mmHg); moderate LAE; moderate RVE; mild TR.     Antimicrobials:    Subjective: Seen during HD, he is sleepy, denies dyspnea or chest pain.   Objective: Vitals:   07/30/17 1030 07/30/17 1100 07/30/17 1130 07/30/17 1155  BP: (!) 145/78 (!) 153/84 (!) 157/80 (!) 166/93  Pulse: 68 65 68 63  Resp: 18 18 17 18   Temp:    98.1 F (36.7 C)  TempSrc:    Oral  SpO2:    100%  Weight:    97 kg (213 lb 13.5 oz)  Height:        Intake/Output Summary (Last 24 hours) at 07/30/2017 1407 Last data filed at 07/30/2017 1155 Gross per 24 hour  Intake 970 ml  Output 3500 ml  Net -2530 ml   Filed Weights   07/28/17 1640 07/30/17 0720 07/30/17 1155  Weight: 99 kg (218 lb 4.1 oz) 100.5 kg (221 lb 9 oz) 97 kg (213 lb 13.5 oz)    Examination:  General exam: Appears calm and comfortable  Respiratory system: Clear to auscultation. Respiratory effort normal. Cardiovascular system: S1 & S2 heard, RRR. Systolic murmur.   Gastrointestinal system: Abdomen is nondistended, soft and nontender. No organomegaly or masses felt. Normal bowel sounds heard. Central nervous system: Alert and oriented. No focal neurological deficits. Extremities: Symmetric 5 x 5 power. Skin: erythema and necrosis LE same.    Data Reviewed: I have personally reviewed following labs and imaging studies  CBC: Recent Labs  Lab 07/26/17 0559 07/27/17 0624 07/28/17 0705 07/29/17 1244 07/30/17 0558  WBC 8.6 9.8 10.6* 11.5* 11.0*  HGB 11.1* 11.3* 11.2* 11.2* 11.1*  HCT 35.6* 36.9* 36.5* 35.7* 35.3*  MCV 87.5 89.6 89.2 88.1 88.3  PLT 346 352 330 314 702   Basic Metabolic Panel: Recent Labs  Lab 07/23/17 1519  07/25/17 0649 07/26/17 0559 07/28/17 0705 07/29/17 1244 07/30/17 0558  NA  --    < > 137 134* 135 134* 136  K  --    < > 4.2 3.7 4.6 4.2 4.2  CL  --    < > 94* 94* 93* 94* 96*  CO2  --    < > 24 26 25 24 22   GLUCOSE  --    < > 93 92 99 104* 110*  BUN  --    < > 45* 26* 50* 34* 44*  CREATININE  --    < > 7.15* 4.91* 8.02* 6.40* 7.22*  CALCIUM  --    < > 8.6* 8.7* 8.4* 8.4* 8.4*  PHOS 12.6*  --   --   --  7.8* 5.3* 5.9*   < > = values in this interval not displayed.   GFR: Estimated Creatinine Clearance: 15.1 mL/min (A) (by C-G formula based on SCr of 7.22 mg/dL (H)). Liver Function Tests: Recent Labs  Lab 07/28/17 0705 07/29/17 1244 07/30/17 0558  ALBUMIN 2.4* 2.5* 2.4*   No results for input(s): LIPASE, AMYLASE in the last 168 hours. Recent Labs  Lab 07/25/17 0649  AMMONIA 33   Coagulation Profile: Recent Labs  Lab 07/26/17 0559 07/27/17 0624 07/28/17 0705 07/29/17 1244 07/30/17 0558  INR 1.29 1.29 1.29 1.31 1.33   Cardiac Enzymes: No results for input(s): CKTOTAL, CKMB, CKMBINDEX, TROPONINI in the last 168 hours. BNP (last 3 results) No results for input(s): PROBNP in the last 8760 hours. HbA1C: No results for input(s): HGBA1C in the last 72 hours. CBG: Recent Labs  Lab 07/28/17 1744  07/28/17 2046 07/29/17 0623 07/29/17 1129 07/29/17 1558  GLUCAP 129* 124* 107* 125* 130*   Lipid Profile: No results for input(s): CHOL, HDL, LDLCALC, TRIG, CHOLHDL, LDLDIRECT in the last 72 hours. Thyroid Function Tests: No results for input(s): TSH, T4TOTAL, FREET4, T3FREE, THYROIDAB in the last 72 hours. Anemia Panel: No results for input(s): VITAMINB12, FOLATE, FERRITIN, TIBC, IRON, RETICCTPCT in the last 72 hours. Sepsis Labs: No results for input(s): PROCALCITON, LATICACIDVEN in the last 168 hours.  No results found for this or any previous visit (from the past 240 hour(s)).       Radiology Studies: No results found.      Scheduled Meds: . amLODipine  10 mg Oral Daily  . aspirin  81 mg Oral Daily  . carvedilol  25 mg Oral BID WC  . cinacalcet  90 mg Oral Q breakfast  . cloNIDine  0.2 mg Transdermal Q Tue  . divalproex  500 mg Oral Q12H  . escitalopram  10 mg Oral Daily  . ferric citrate  630 mg Oral TID WC  . gabapentin  100 mg Oral BID  . hydrALAZINE  100 mg Oral Q8H  . irbesartan  300 mg Oral Daily  . isosorbide dinitrate  10 mg Oral BID  . multivitamin  1 tablet Oral QHS  . nicotine  21 mg Transdermal Daily  . pravastatin  40 mg Oral Daily  . sevelamer carbonate  2,400 mg Oral TID WC  . sodium chloride flush  3 mL Intravenous Q12H   Continuous Infusions: . sodium chloride    . sodium chloride    . sodium chloride    . [START ON 08/01/2017] ferric gluconate (FERRLECIT/NULECIT) IV    . heparin 2,700 Units/hr (07/30/17 1217)  . [START ON 08/01/2017] sodium thiosulfate infusion for calciphylaxis       LOS: 10 days    Time spent: 35 minutes.     Elmarie Shiley, MD [name] Pager [phone]  If 7PM-7AM, please contact night-coverage [email or web address] Password [email or web address] 07/30/2017, 2:07 PM

## 2017-07-30 NOTE — Care Management Important Message (Signed)
Important Message  Patient Details  Name: Hayden [name] MRN: [id number] Date of Birth: Aug 25, 1970   Medicare Important Message Given:  Yes    [name] 07/30/2017, 12:58 PM

## 2017-07-30 NOTE — Progress Notes (Signed)
ANTICOAGULATION CONSULT NOTE - Follow Up Consult  Pharmacy Consult for Heparin Indication: atrial fibrillation  Allergies  Allergen Reactions  . Dilaudid [Hydromorphone Hcl] Other (See Comments)    ABNORMAL BEHAVIOR, "VERBALLY AND PHYSICALLY ABUSIVE," PSYCHOSIS  . Morphine And Related Itching  . Adhesive [Tape] Other (See Comments)    Redness from adhesive tape if left on too long, paper tape is preferred    Patient Measurements: Height: 5\' 10"  (177.8 cm) Weight: 213 lb 13.5 oz (97 kg) IBW/kg (Calculated) : 73 Heparin Dosing Weight:    Vital Signs: Temp: 98.1 F (36.7 C) (01/30 1155) Temp Source: Oral (01/30 1155) BP: 164/76 (01/30 1710) Pulse Rate: 73 (01/30 1710)  Labs: Recent Labs    07/28/17 0705 07/28/17 1150 07/29/17 1244 07/30/17 0558  HGB 11.2*  --  11.2* 11.1*  HCT 36.5*  --  35.7* 35.3*  PLT 330  --  314 346  LABPROT 16.0*  --  16.1* 16.4*  INR 1.29  --  1.31 1.33  HEPARINUNFRC 0.79* 0.39 0.24* 0.23*  CREATININE 8.02*  --  6.40* 7.22*    Estimated Creatinine Clearance: 15.1 mL/min (A) (by C-G formula based on SCr of 7.22 mg/dL (H)).   Assessment:  Anticoag: Coumadin PTA for Afib (CHADsVASc = 5). C/w skin necrosis so switched back to heparin, Renal thinks calciphylaxis, s/p Vit K 5mg  1/23 and skin biopsy 1/25 - INR 1.29.  1/30 Cath: 2V obstructive CAD. Mod-severe AS, MS, mod pulm HTN, Treat medically. Resume heparin post-cath 8 hrs post sheath removal (femoral) 1710  Goal of Therapy:  Heparin level 0.3-0.5 units/ml Monitor platelets by anticoagulation protocol: Yes   Plan:  Resume Heparin at 2700 units/hr at 0110 Check HL 6-8 hr after restarted Daily heparin level and CBC, for s/sx bleeding  Jeanet Lupe S. Alford Highland, PharmD, BCPS Clinical Staff Pharmacist Pager [phone]  Eilene Ghazi Stillinger 07/30/2017,5:19 PM

## 2017-07-30 NOTE — Progress Notes (Signed)
ANTICOAGULATION CONSULT NOTE - Follow Up Consult  Pharmacy Consult:  Heparin Indication:  Aflutter  Patient Measurements: Height: 5\' 10"  (177.8 cm) Weight: 218 lb 4.1 oz (99 kg) IBW/kg (Calculated) : 73  Heparin dosing weight: 94 kg  Vital Signs: Temp: 98.2 F (36.8 C) (01/30 0611) Temp Source: Oral (01/30 0611) BP: 140/70 (01/30 0611) Pulse Rate: 65 (01/30 0611)  Labs: Recent Labs    07/28/17 0705 07/28/17 1150 07/29/17 1244 07/30/17 0558  HGB 11.2*  --  11.2* 11.1*  HCT 36.5*  --  35.7* 35.3*  PLT 330  --  314 346  LABPROT 16.0*  --  16.1* 16.4*  INR 1.29  --  1.31 1.33  HEPARINUNFRC 0.79* 0.39 0.24* 0.23*  CREATININE 8.02*  --  6.40* 7.22*    Estimated Creatinine Clearance: 15.2 mL/min (A) (by C-G formula based on SCr of 7.22 mg/dL (H)).   Assessment: 80 YOM who was on Coumadin PTA for history of AFib.  Patient was transitioned to IV heparin for concern of Coumadin-induced skin necrosis vs calciphylaxis. Negative for DVT. S/p skin biopsy on 07/25/17.  Heparin level continues to be slightly below goal and essentially unchanged at 0.23 after slight rate increase. No bleeding or issues with the drip noted per RN. Tentatively scheduled for HD this AM prior to diagnostic cath per Cardiology.  Goal of Therapy:  Heparin level 0.3-0.5 units/ml (concern for new CVA vs. septic emboli)  Monitor platelets by anticoagulation protocol: Yes   Plan:  Increase heparin gtt to 2700 units/hr (conservative increase for now) Monitor daily heparin level and CBC, for s/sx bleeding   Elicia Lamp, PharmD, BCPS Clinical Pharmacist Clinical phone for 07/30/2017 until 3:30pm: [phone] If after 3:30pm, please call main pharmacy at: [phone] 07/30/2017 8:27 AM

## 2017-07-30 NOTE — Plan of Care (Signed)
  Education: Knowledge of secondary prevention will improve 07/30/2017 0552 - Progressing by Janee Morn, RN

## 2017-07-31 ENCOUNTER — Inpatient Hospital Stay (HOSPITAL_COMMUNITY): Payer: Medicare Other

## 2017-07-31 ENCOUNTER — Other Ambulatory Visit: Payer: Self-pay | Admitting: Physician Assistant

## 2017-07-31 ENCOUNTER — Encounter (HOSPITAL_COMMUNITY): Payer: Self-pay | Admitting: Cardiology

## 2017-07-31 DIAGNOSIS — I350 Nonrheumatic aortic (valve) stenosis: Secondary | ICD-10-CM

## 2017-07-31 LAB — CBC
HCT: 34.7 % — ABNORMAL LOW (ref 39.0–52.0)
HEMOGLOBIN: 10.8 g/dL — AB (ref 13.0–17.0)
MCH: 27.6 pg (ref 26.0–34.0)
MCHC: 31.1 g/dL (ref 30.0–36.0)
MCV: 88.5 fL (ref 78.0–100.0)
Platelets: 341 10*3/uL (ref 150–400)
RBC: 3.92 MIL/uL — AB (ref 4.22–5.81)
RDW: 18.7 % — ABNORMAL HIGH (ref 11.5–15.5)
WBC: 10.3 10*3/uL (ref 4.0–10.5)

## 2017-07-31 LAB — PROTIME-INR
INR: 1.36
PROTHROMBIN TIME: 16.6 s — AB (ref 11.4–15.2)

## 2017-07-31 LAB — RENAL FUNCTION PANEL
ALBUMIN: 2.4 g/dL — AB (ref 3.5–5.0)
Anion gap: 14 (ref 5–15)
BUN: 26 mg/dL — AB (ref 6–20)
CALCIUM: 8.5 mg/dL — AB (ref 8.9–10.3)
CO2: 26 mmol/L (ref 22–32)
CREATININE: 5.32 mg/dL — AB (ref 0.61–1.24)
Chloride: 98 mmol/L — ABNORMAL LOW (ref 101–111)
GFR calc Af Amer: 14 mL/min — ABNORMAL LOW (ref >60)
GFR, EST NON AFRICAN AMERICAN: 12 mL/min — AB (ref >60)
Glucose, Bld: 81 mg/dL (ref 65–99)
Phosphorus: 5.5 mg/dL — ABNORMAL HIGH (ref 2.5–4.6)
Potassium: 3.9 mmol/L (ref 3.5–5.1)
SODIUM: 138 mmol/L (ref 135–145)

## 2017-07-31 LAB — GLUCOSE, CAPILLARY
Glucose-Capillary: 87 mg/dL (ref 65–99)
Glucose-Capillary: 94 mg/dL (ref 65–99)

## 2017-07-31 LAB — HEPARIN LEVEL (UNFRACTIONATED)
Heparin Unfractionated: 0.22 IU/mL — ABNORMAL LOW (ref 0.30–0.70)
Heparin Unfractionated: 0.46 IU/mL (ref 0.30–0.70)

## 2017-07-31 MED ORDER — ATORVASTATIN CALCIUM 80 MG PO TABS
80.0000 mg | ORAL_TABLET | Freq: Every day | ORAL | Status: DC
  Administered 2017-07-31 – 2017-08-05 (×6): 80 mg via ORAL
  Filled 2017-07-31 (×6): qty 1

## 2017-07-31 MED ORDER — IOPAMIDOL (ISOVUE-370) INJECTION 76%
INTRAVENOUS | Status: AC
  Administered 2017-07-31: 11:00:00 80 mL
  Filled 2017-07-31: qty 100

## 2017-07-31 MED ORDER — FENTANYL CITRATE (PF) 100 MCG/2ML IJ SOLN
25.0000 ug | Freq: Once | INTRAMUSCULAR | Status: AC
  Administered 2017-07-31: 10:00:00 25 ug via INTRAVENOUS
  Filled 2017-07-31: qty 2

## 2017-07-31 MED FILL — Lidocaine HCl Local Inj 1%: INTRAMUSCULAR | Qty: 20 | Status: AC

## 2017-07-31 NOTE — Progress Notes (Addendum)
Progress Note  Patient Name: Hayden [name] Date of Encounter: 07/31/2017  Primary Cardiologist: Dr Ellyn Hack  Subjective   No CP or dyspnea  Inpatient Medications    Scheduled Meds: . amLODipine  10 mg Oral Daily  . aspirin  81 mg Oral Daily  . carvedilol  25 mg Oral BID WC  . cinacalcet  90 mg Oral Q breakfast  . cloNIDine  0.2 mg Transdermal Q Tue  . divalproex  500 mg Oral Q12H  . escitalopram  10 mg Oral Daily  . ferric citrate  630 mg Oral TID WC  . gabapentin  100 mg Oral BID  . hydrALAZINE  100 mg Oral Q8H  . irbesartan  300 mg Oral Daily  . isosorbide dinitrate  10 mg Oral BID  . multivitamin  1 tablet Oral QHS  . nicotine  21 mg Transdermal Daily  . pravastatin  40 mg Oral Daily  . sevelamer carbonate  2,400 mg Oral TID WC  . sodium chloride flush  3 mL Intravenous Q12H   Continuous Infusions: . sodium chloride    . sodium chloride    . [START ON 08/01/2017] ferric gluconate (FERRLECIT/NULECIT) IV    . heparin 2,700 Units/hr (07/31/17 8144)  . [START ON 08/01/2017] sodium thiosulfate infusion for calciphylaxis     PRN Meds: sodium chloride, sodium chloride, alteplase, heparin, heparin, heparin, lidocaine (PF), lidocaine-prilocaine, nitroGLYCERIN, oxyCODONE, pentafluoroprop-tetrafluoroeth, senna-docusate, sodium chloride flush   Vital Signs    Vitals:   07/30/17 2026 07/30/17 2100 07/31/17 0000 07/31/17 0416  BP:   (!) 180/82 (!) 129/58  Pulse: 74 72 76 77  Resp: 17 (!) 27 18 14   Temp:    98.1 F (36.7 C)  TempSrc:    Oral  SpO2: 92% 92% 95% 92%  Weight:    220 lb 14.4 oz (100.2 kg)  Height:        Intake/Output Summary (Last 24 hours) at 07/31/2017 0815 Last data filed at 07/31/2017 0600 Gross per 24 hour  Intake 103.95 ml  Output 3500 ml  Net -3396.05 ml   Filed Weights   07/30/17 0720 07/30/17 1155 07/31/17 0416  Weight: 221 lb 9 oz (100.5 kg) 213 lb 13.5 oz (97 kg) 220 lb 14.4 oz (100.2 kg)    Physical Exam   GEN: WD, obese NAD Neck:  no JVD, supple Cardiac: RRR, 3/6 systolic murmur, S2 diminished; no DM Respiratory: CTA; no wheeze GI: Soft, NT/ND, no masses MS: s/p right AKA; erythematous lesion LLE Neuro:  grossly intact   Labs    Chemistry Recent Labs  Lab 07/29/17 1244 07/30/17 0558 07/31/17 0431  NA 134* 136 138  K 4.2 4.2 3.9  CL 94* 96* 98*  CO2 24 22 26   GLUCOSE 104* 110* 81  BUN 34* 44* 26*  CREATININE 6.40* 7.22* 5.32*  CALCIUM 8.4* 8.4* 8.5*  ALBUMIN 2.5* 2.4* 2.4*  GFRNONAA 9* 8* 12*  GFRAA 11* 9* 14*  ANIONGAP 16* 18* 14     Hematology Recent Labs  Lab 07/29/17 1244 07/30/17 0558 07/31/17 0431  WBC 11.5* 11.0* 10.3  RBC 4.05* 4.00* 3.92*  HGB 11.2* 11.1* 10.8*  HCT 35.7* 35.3* 34.7*  MCV 88.1 88.3 88.5  MCH 27.7 27.8 27.6  MCHC 31.4 31.4 31.1  RDW 18.9* 18.7* 18.7*  PLT 314 346 341      Patient Profile     47 year old male with past medical history of diabetes mellitus, prior CVA, seizures, prior partial nephrectomy for kidney cancer, end-stage  renal disease, prior right AKA, hypertension, hyperlipidemia, bipolar disorder, history of atrial flutter, prior pacemaker for evaluation of aortic stenosis and cardiomyopathy.  Admitted with lower extremity cellulitis.  Had decreased mental status transiently and MRI showed multiple small infarcts.  Echocardiogram was performed and showed ejection fraction 35-40% with akinesis of the apical myocardium, severe aortic stenosis with mean gradient 60 mmHg, severe mitral annular calcification with severe mitral stenosis with with mean gradient 12 mmHg, moderate left atrial enlargement and moderate right ventricular enlargement.    Assessment & Plan    1. Aortic stenosis/mitral stenosis-Echocardiogram shows newly reduced LV function with wall motion abnormality suggesting CAD (vs apical abnormality due to pacing).  Cath results noted. Plan medical therapy for CAD. Continue ASA and statin. Given multiple comorbidities (end-stage renal disease  dialysis dependent, prior CVA, prior right AKA, recent cellulitis left lower extremity) unclear if he is a candidate for AVR/MVR/CABG; presently being evaluated for possible TAVR though would not address mitral valve. Await results of CTA today then will need further discussion with Dr Burt Knack. 2. CAD-plan medical therapy; treat with ASA and statin. 3. History of atrial flutter-given CVA he will require long-term anticoagulation.  Continue heparin until all procedures complete.  Continue carvedilol for rate control if atrial flutter recurs. 4. Prior pacemaker 5. Probable ischemic cardiomyopathy-continue beta-blocker.  Patient also on an ARB. 6. End-stage renal disease dialysis dependent-being dialyzed today. 7. Possible lower extremity cellulitis-Skin biopsy results c/w calciphylaxis. Per IM. 8. Hypertension-continue present blood pressure medications. 9. Hyperlipidemia-given documented CAD, DC pravachol and treat with lipitor 80 mg daily.     For questions or updates, please contact [place] Please consult [email or web address] for contact info under Cardiology/STEMI.      Signed, Kirk Ruths, MD  07/31/2017, 8:15 AM

## 2017-07-31 NOTE — Progress Notes (Signed)
Suture removed from biopsy site in LLE. Wound closed without bleeding. No need to apply dressing.   Dispo - General Surgery will sign off. Please page [place] with an further concerns.   Signed:  Edison Simon , PA-S [facility] Surgery 07/31/2017, 11:11 AM Pager: [phone] Trauma Pager: [phone] Mon-Fri 7:00 am-4:30 pm Sat-Sun 7:00 am-11:30 am

## 2017-07-31 NOTE — Progress Notes (Signed)
Patient is back form CT scan and states his pain is much better, rating it a 4/10 at this time. Lying in bed appears to be trying to sleep at this time with left leg elevated on pillow.

## 2017-07-31 NOTE — Progress Notes (Signed)
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 XR showed calcified vessels without evidence of emphysema or fracture. Vancomycin and zosyn were given, nephrology consulted for HD while inpatient, and he was admitted. Fistulogram performed 1/22 showed widely patent AVF circuit. The patient grew more lethargic following the procedure. Neurology was consulted, sedating medications held. EEG showed nonspecific slowing. MRI showed multiple small supra- and infratentorial diffusion abnormalities most consistent with infarcts spanning multiple vascular territories. Work up of stroke included echocardiogram which showed severe aortic and mitral stenosis as well as new wall motion abnormalities. Cardiology was consulted and CT surgery evaluation is pending to decide on further actions.      Assessment & Plan:   Active Problems:   History of partial Right nephrectomy for renal mass (2008)   End stage renal disease on dialysis [facility])   History of CVA (cerebrovascular accident)   Atherosclerosis of native arteries of the extremities with ulceration (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 U/S negative for DVT. Blood cultures negative.  - Erythema worsening/stable despite many days of broad spectrum abx.  -this is all related to calciphylaxis.  -  biopsy results from 1/25, showed VASCULITIS WITH CALCIFICATION MOST CONSISTENT WITH CALCIPHYLAXIS. - Holding coumadin, continue heparin, holding Ca. Phos binder per nephrology. Pt not adherent.  - Pain control,  with oxycodone, decreased dose with lethargy. -ID consulted. They evaluated patient on 1-28 recommend to stop antibiotics. Diagnosis consistent with calciphylaxis.  -started on renvela and thiosulfate. Appreciate Dr Lorrene Reid help.  -ok to resume coumadin, when appropriated, discussed with Dr Lorrene Reid.   CVA: Seen on motion-degraded MRI. Multiple vascular distributions involved consistent with cardioembolic/septic emboli. Echo showed no thrombus. Carotid U/S limited by pt movement showed 1-39% stenosis of bilateral ICA's. 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  - Stroke team signed off, no new recommendations at this time.  - Continue statin - IV heparin, will require ongoing anticoagulation.   Acute systolic CHF with severe aortic stenosis, mitral stenosis: Echo with newly depressed LVEF to 35-40% from 60-65% with akinesis of mid-apicalanteroseptal myocardium. No wall motion abnormalities on last echo in Aug 2018. Also showed severe aortic stenosis, mitral stenosis. Pt has no chest pain. - Pending CT surgery evaluation/multidisciplinary meeting.   -cath , 2 vessels CAD. Medical management  CT angio, coronary pending results. Await Dr Cyndia Bent evaluation.   ESRD on hemodialysiswith mild hyperkalemia and mild anemia - Nephrology providing HD on schedule.   Acute metabolic encephalopathy: Resolving. With waxing/waning course, suspect delirium worsened by sedating medications and CVA. EEG 1/23 nonspecific slowing without epileptiform discharges.  - Ammonia mildly  elevated,  gave lactulose, though had significant diarrhea and mental status improved. Holding for now.  - Hold sedating medications as able.  -oxycodone dose decreased.   Above knee amputation of right lower extremity - PT evaluation, eventual dispo to CIR anticipated.  Complete heart block: s/p PPM placement Nov 2018.  - No signs pacemaker is malfunctioning.   Atrial flutter: CHA2DS2-VASc Scoreis 5 - continue coreg. - Heparin per pharmacy, stopped coumadin  Hyperlipidemia: - Continue statin.   Bipolar/depression:  - Continue depakote and lexapro     DVT prophylaxis: IV heparin.  Code Status: full code.  Family Communication: care discussed with patient and wife Disposition Plan: to be determine. CIR vs SNF  Consultants:   Nephrology  IR  Neurology  General surgery  Cardiology  Cardiothoracic surgery  ID       Procedures:   HD Echocardiogram 07/24/2017: Akinesis of the distal anteroseptal wall and apex with overall moderate LV dysfunction; severe LVH; heavily calcified aortic valve with severe AS (mean gradient 60 mmHg); severe MAC with severe MS (mean gradient 12 mmHg); moderate LAE; moderate RVE; mild TR.     Antimicrobials:    Subjective: Complaining of pain left leg , sleepy just got pain medication, but overall wife think he has been more alert   Objective: Vitals:   07/31/17 0416 07/31/17 0800 07/31/17 0949 07/31/17 1100  BP: (!) 129/58 (!) 164/78 (!) 160/67 (!) 156/74  Pulse: 77 75    Resp: 14 (!) 25  12  Temp: 98.1 F (36.7 C) 98.5 F (36.9 C)    TempSrc: Oral Oral    SpO2: 92% 94%    Weight: 100.2 kg (220 lb 14.4 oz)     Height:        Intake/Output Summary (Last 24 hours) at 07/31/2017 1424 Last data filed at 07/31/2017 1100 Gross per 24 hour  Intake 238.95 ml  Output -  Net 238.95 ml   Filed Weights   07/30/17 0720 07/30/17 1155 07/31/17 0416  Weight: 100.5 kg (221 lb 9 oz) 97 kg (213 lb 13.5 oz)  100.2 kg (220 lb 14.4 oz)    Examination:  General exam: NAD Respiratory system: CTA Cardiovascular system: S 1, S 2 Systolic murmur.  Gastrointestinal system: BS present, soft, nt Central nervous system: alert  Extremities: symmetric power.  Skin: erythema and necrosis, stab;e.    Data Reviewed: I have personally reviewed following labs and imaging studies  CBC: Recent Labs  Lab 07/27/17 0624 07/28/17 0705 07/29/17 1244 07/30/17 0558 07/31/17 0431  WBC 9.8 10.6* 11.5* 11.0* 10.3  HGB 11.3* 11.2* 11.2* 11.1* 10.8*  HCT 36.9* 36.5* 35.7* 35.3* 34.7*  MCV 89.6 89.2 88.1 88.3 88.5  PLT 352 330 314 346 891   Basic Metabolic Panel: Recent Labs  Lab 07/26/17 0559 07/28/17 0705 07/29/17 1244 07/30/17 0558 07/31/17 0431  NA 134* 135 134* 136 138  K 3.7 4.6 4.2 4.2 3.9  CL 94* 93* 94* 96* 98*  CO2 _0 GLUCOSE 92 99 104* 110* 81  BUN 26* 50* 34* 44* 26*  CREATININE 4.91* 8.02* 6.40* 7.22* 5.32*  CALCIUM 8.7* 8.4* 8.4* 8.4* 8.5*  PHOS  --  7.8* 5.3* 5.9* 5.5*   GFR: Estimated Creatinine Clearance: 20.8 mL/min (A) (by C-G formula based on SCr of 5.32 mg/dL (H)). Liver Function Tests: Recent Labs  Lab 07/28/17 0705 07/29/17 1244 07/30/17 0558 07/31/17 0431  ALBUMIN 2.4* 2.5* 2.4* 2.4*   No results for input(s): LIPASE, AMYLASE in the  last 168 hours. Recent Labs  Lab 07/25/17 0649  AMMONIA 33   Coagulation Profile: Recent Labs  Lab 07/27/17 0624 07/28/17 0705 07/29/17 1244 07/30/17 0558 07/31/17 0431  INR 1.29 1.29 1.31 1.33 1.36   Cardiac Enzymes: No results for input(s): CKTOTAL, CKMB, CKMBINDEX, TROPONINI in the last 168 hours. BNP (last 3 results) No results for input(s): PROBNP in the last 8760 hours. HbA1C: No results for input(s): HGBA1C in the last 72 hours. CBG: Recent Labs  Lab 07/30/17 1659 07/30/17 1702 07/30/17 2132 07/31/17 0600 07/31/17 1144  GLUCAP 78 81 110* 87 94   Lipid Profile: No results for input(s): CHOL,  HDL, LDLCALC, TRIG, CHOLHDL, LDLDIRECT in the last 72 hours. Thyroid Function Tests: No results for input(s): TSH, T4TOTAL, FREET4, T3FREE, THYROIDAB in the last 72 hours. Anemia Panel: No results for input(s): VITAMINB12, FOLATE, FERRITIN, TIBC, IRON, RETICCTPCT in the last 72 hours. Sepsis Labs: No results for input(s): PROCALCITON, LATICACIDVEN in the last 168 hours.  No results found for this or any previous visit (from the past 240 hour(s)).       Radiology Studies: No results found.      Scheduled Meds: . amLODipine  10 mg Oral Daily  . aspirin  81 mg Oral Daily  . atorvastatin  80 mg Oral q1800  . carvedilol  25 mg Oral BID WC  . cinacalcet  90 mg Oral Q breakfast  . cloNIDine  0.2 mg Transdermal Q Tue  . divalproex  500 mg Oral Q12H  . escitalopram  10 mg Oral Daily  . ferric citrate  630 mg Oral TID WC  . gabapentin  100 mg Oral BID  . hydrALAZINE  100 mg Oral Q8H  . irbesartan  300 mg Oral Daily  . isosorbide dinitrate  10 mg Oral BID  . multivitamin  1 tablet Oral QHS  . nicotine  21 mg Transdermal Daily  . sevelamer carbonate  2,400 mg Oral TID WC  . sodium chloride flush  3 mL Intravenous Q12H   Continuous Infusions: . sodium chloride    . sodium chloride    . [START ON 08/01/2017] ferric gluconate (FERRLECIT/NULECIT) IV    . heparin 2,900 Units/hr (07/31/17 1140)  . [START ON 08/01/2017] sodium thiosulfate infusion for calciphylaxis       LOS: 11 days    Time spent: 35 minutes.     Elmarie Shiley, MD [name] Pager [phone]  If 7PM-7AM, please contact night-coverage [email or web address] Password [facility] 07/31/2017, 2:24 PM

## 2017-07-31 NOTE — Progress Notes (Signed)
[facility] Progress Note   Subjective:  S/p cardiac cath - 2 vessel CAD, EF 55-65%, mod-severe AS HD yesterday-  post HD weight 97kg  Phos improving More alert today. Didn't sleep well last night with leg pain   Objective Vitals:   07/30/17 2100 07/31/17 0000 07/31/17 0416 07/31/17 0800  BP:  (!) 180/82 (!) 129/58 (!) 164/78  Pulse: 72 76 77 75  Resp: (!) 27 18 14  (!) 25  Temp:   98.1 F (36.7 C) 98.5 F (36.9 C)  TempSrc:   Oral Oral  SpO2: 92% 95% 92% 94%  Weight:   100.2 kg (220 lb 14.4 oz)   Height:       Physical Exam Obese bearded WM NAD No JVD Lungs clear 3/6 murmur LSB heard in neck and out to apex Abdomen is obese, no focal tenderness. + BS Extremities: s/p R AKA; L LE with diffuse erythema, necrotic areas to skin, covered by dressing  Dialysis Access: L AVF +bruit   Recent Labs  Lab 07/29/17 1244 07/30/17 0558 07/31/17 0431  NA 134* 136 138  K 4.2 4.2 3.9  CL 94* 96* 98*  CO2 24 22 26   GLUCOSE 104* 110* 81  BUN 34* 44* 26*  CREATININE 6.40* 7.22* 5.32*  CALCIUM 8.4* 8.4* 8.5*  PHOS 5.3* 5.9* 5.5*    Recent Labs  Lab 07/27/17 0624 07/28/17 0705 07/29/17 1244 07/30/17 0558 07/31/17 0431  WBC 9.8 10.6* 11.5* 11.0* 10.3  HGB 11.3* 11.2* 11.2* 11.1* 10.8*  HCT 36.9* 36.5* 35.7* 35.3* 34.7*  MCV 89.6 89.2 88.1 88.3 88.5  PLT 352 330 314 346 341   Blood Culture    Component Value Date/Time   SDES BLOOD RIGHT HAND 07/19/2017 2330   SPECREQUEST  07/19/2017 2330    BOTTLES DRAWN AEROBIC ONLY Blood Culture adequate volume   CULT NO GROWTH 5 DAYS 07/19/2017 2330   REPTSTATUS 07/25/2017 FINAL 07/19/2017 2330     Recent Labs  Lab 07/29/17 1558 07/30/17 1659 07/30/17 1702 07/30/17 2132 07/31/17 0600  GLUCAP 130* 78 81 110* 87    Lab Results  Component Value Date   INR 1.36 07/31/2017   INR 1.33 07/30/2017   INR 1.31 07/29/2017    Biopsy LLE: VASCULITIS WITH CALCIFICATION MOST CONSISTENT WITH  CALCIPHYLAXIS  Medications: . sodium chloride    . sodium chloride    . [START ON 08/01/2017] ferric gluconate (FERRLECIT/NULECIT) IV    . heparin 2,700 Units/hr (07/31/17 7846)  . [START ON 08/01/2017] sodium thiosulfate infusion for calciphylaxis     . amLODipine  10 mg Oral Daily  . aspirin  81 mg Oral Daily  . atorvastatin  80 mg Oral q1800  . carvedilol  25 mg Oral BID WC  . cinacalcet  90 mg Oral Q breakfast  . cloNIDine  0.2 mg Transdermal Q Tue  . divalproex  500 mg Oral Q12H  . escitalopram  10 mg Oral Daily  . ferric citrate  630 mg Oral TID WC  . gabapentin  100 mg Oral BID  . hydrALAZINE  100 mg Oral Q8H  . irbesartan  300 mg Oral Daily  . isosorbide dinitrate  10 mg Oral BID  . multivitamin  1 tablet Oral QHS  . nicotine  21 mg Transdermal Daily  . sevelamer carbonate  2,400 mg Oral TID WC  . sodium chloride flush  3 mL Intravenous Q12H    Dialysis Orders:  MWF NW 4h  450/800  98kg  2/2.25 bath  L AVF  Hep 2400  - Hectoral 9mcg IV q HD (D/c'd) - Venofer 100mg  x 10 ordered (4 given) - Mircera 272mcg IV q 2 weeks (last 1/18) -home BP Rx norvasc 10, coreg 25 bid, clon patch 0.2 weekly, lasix 120 bid, hydral 100 tid, avapro 300   Assessment/Plan:  1. Calciphylaxis. Leg eschar biopsy consistent with calciphylaxis.VERY poor phosphorus control/binder non-adherence as outpt .On sensipar (dose ^ to 31), phosphorus better controlled in the hospital with forced  Compliance. Added renvela powder to [place].  Adding Na thiosulfate with HD. Discussed with pt again today the absolute necessity of taking binders correctly.  2. ESRD HD MWF. Next HD 2/1. Weight down, edema better (in the hospital)  3. CM/CHF/MS/severe AS- newly reduced LV fx and WMA's worrisome for ischemia. Cards evaluating. CTS following for possible TAVR. S/p R/L heart cath 1/30 with 2 vessel obstructive CAD (med management), normal LV fx EF 55-65%, mod-severe AS, mod MS.   For CTA today.   4. Bipolar 5. HTNcontrolled here onmult bp meds.  6. Anemiahgb 10.8 stable 9% sat 1/24 ferritin 1427 Replete with HD.  7. DM2 8. PVD- right aka 9. Aflutter- on heparin  - will need long term anticoagulation 10. Nutrition - renal diet. Low albumin. Eating food brought in from outside and not getting binders with this  Timing of outside food and  binders an issue 11. CVA multiple bilateral on MRI. Suspect cardioembolic (from aflutter). 1-39% bilat ICA stenosis, no thrombus on ECHO. 12. Tobacco abuse  Lynnda Child PA-C [facility] Pager [phone] 07/31/2017,8:57 AM   I have seen and examined this patient and agree with plan and assessment in the above note with renal recommendations/intervention highlighted. Treating calciphylaxis as aggressively as possible. Long term outcome dependent on pt compliance. Adding sodium thiosulfate with HD. Eval for TAVR continues. CTA today.  Kerron Sedano B,MD 07/31/2017 10:20 AM

## 2017-07-31 NOTE — Plan of Care (Signed)
  Pain Managment: General experience of comfort will improve 07/31/2017 0048 - Progressing by Tish Frederickson, RN   Cardiovascular: Ability to achieve and maintain adequate cardiovascular perfusion will improve 07/31/2017 0048 - Completed/Met by Tish Frederickson, RN Vascular access site(s) Level 0-1 will be maintained 07/31/2017 0048 - Completed/Met by Tish Frederickson, RN

## 2017-07-31 NOTE — Progress Notes (Signed)
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 Pt presented to the ED 1/19 with left lower extremity redness and pain. Pt became encephalopic and MRI on 1/23 revealed multiple small supra-and infratentorial diffusion abnormalities most consistent with infarcts spanning multiple vascular territories. Pt now s/p RIGHT/LEFT HEART CATH AND CORONARY ANGIOGRAPHY on 1/30.    PT Comments    Pt with greatly improved cognition this session; however, a bit impatient and frustrated throughout. PT offered supportive encouragement. Pt tolerated bed mobility with mod A and A-P transfer with min A x2. Pt continues to have significant pain in L LE and unable to tolerate in a dependent position or weight bearing. Pt would continue to benefit from skilled physical therapy services at this time while admitted and after d/c to address the below listed limitations in order to improve overall safety and independence with functional mobility.   Follow Up Recommendations  CIR;Supervision/Assistance - 24 hour     Equipment Recommendations  None recommended by PT    Recommendations for Other Services       Precautions / Restrictions Precautions Precautions: Fall Restrictions Weight Bearing Restrictions: No    Mobility  Bed Mobility Overal bed mobility: Needs Assistance Bed Mobility: Supine to Sit;Rolling Rolling: Mod assist   Supine to sit: Mod assist     General bed mobility comments: increased time and effort, pt elevated trunk into long sitting with mod A  Transfers Overall transfer level: Needs assistance Equipment used: None Transfers:  Comptroller transfers: +2 physical assistance;Min assist   General transfer comment: pt able to use bilateral UEs onto bed to assist with scooting, bed pads and min A x2 required to achieve full sitting position in recliner chair  Ambulation/Gait             General Gait Details: has not been ambulating outside of parallel bars - just recently got a prosthesis. Pt with significant pain in L LE as well   Stairs            Wheelchair Mobility    Modified Rankin (Stroke Patients Only)       Balance Overall balance assessment: Needs assistance Sitting-balance support: Bilateral upper extremity supported Sitting balance-Leahy Scale: Fair Sitting balance - Comments: supervision                                    Cognition Arousal/Alertness: Awake/alert Behavior During Therapy: Flat affect;Impulsive Overall Cognitive Status: Within Functional Limits for tasks assessed                                        Exercises      General Comments        Pertinent Vitals/Pain Pain Assessment: Faces Faces Pain Scale: Hurts whole lot Pain Location: L LE Pain Descriptors / Indicators: Sore;Grimacing;Guarding;Moaning Pain Intervention(s): Monitored during session;Repositioned    Home Living  Prior Function            PT Goals (current goals can now be found in the care plan section) Acute Rehab PT Goals PT Goal Formulation: With patient/family Time For Goal Achievement: 08/05/17 Potential to Achieve Goals: Good Progress towards PT goals: Progressing toward goals    Frequency    Min 3X/week      PT Plan Current plan remains appropriate    Co-evaluation              AM-PAC PT "6 Clicks" Daily Activity  Outcome Measure  Difficulty turning over in bed (including adjusting bedclothes, sheets and blankets)?: A Lot Difficulty moving from lying on back  to sitting on the side of the bed? : Unable Difficulty sitting down on and standing up from a chair with arms (e.g., wheelchair, bedside commode, etc,.)?: Unable Help needed moving to and from a bed to chair (including a wheelchair)?: Total Help needed walking in hospital room?: Total Help needed climbing 3-5 steps with a railing? : Total 6 Click Score: 7    End of Session   Activity Tolerance: Patient limited by pain Patient left: in chair;with call bell/phone within reach Nurse Communication: Mobility status;Other (comment)(pt feels that his dressing on L LE is too tight) PT Visit Diagnosis: Other abnormalities of gait and mobility (R26.89)     Time: 6629-4765 PT Time Calculation (min) (ACUTE ONLY): 20 min  Charges:  $Therapeutic Activity: 8-22 mins                    G Codes:       [place], [place], [place] 07/31/2017, 10:36 AM

## 2017-07-31 NOTE — Progress Notes (Signed)
ANTICOAGULATION CONSULT NOTE - Follow Up Consult  Pharmacy Consult:  Heparin Indication:  Aflutter  Patient Measurements: Height: 5\' 10"  (177.8 cm) Weight: 220 lb 14.4 oz (100.2 kg) IBW/kg (Calculated) : 73  Heparin dosing weight: 94 kg  Vital Signs: Temp: 98.5 F (36.9 C) (01/31 0800) Temp Source: Oral (01/31 0800) BP: 156/74 (01/31 1100) Pulse Rate: 75 (01/31 0800)  Labs: Recent Labs    07/29/17 1244 07/30/17 0558 07/31/17 0431 07/31/17 0956  HGB 11.2* 11.1* 10.8*  --   HCT 35.7* 35.3* 34.7*  --   PLT 314 346 341  --   LABPROT 16.1* 16.4* 16.6*  --   INR 1.31 1.33 1.36  --   HEPARINUNFRC 0.24* 0.23*  --  0.22*  CREATININE 6.40* 7.22* 5.32*  --     Estimated Creatinine Clearance: 20.8 mL/min (A) (by C-G formula based on SCr of 5.32 mg/dL (H)).   Assessment: 28 YOM who was on Coumadin PTA for history of AFib.  Patient was transitioned to IV heparin for concern of Coumadin-induced skin necrosis vs calciphylaxis. Negative for DVT. S/p skin biopsy on 07/25/17.  Heparin level continues to be slightly below goal and essentially unchanged. No bleeding or issues with the drip noted per RN.    Goal of Therapy:  Heparin level 0.3-0.5 units/ml (concern for new CVA vs. septic emboli)  Monitor platelets by anticoagulation protocol: Yes    Plan:  Increase heparin gtt to 2900 units/hr Check 6 hr heparin level Daily heparin level and CBC Monitor for s/sx bleeding   Torin Modica D. Mina Marble, PharmD, BCPS Pager:  [phone] 07/31/2017, 11:39 AM

## 2017-07-31 NOTE — Progress Notes (Signed)
Patient is lying in bed with eyes closed and unlabored breathing noted, appears to be sleeping. No S/S of distress noted or complaints voiced at this time. Report given to [facility] without incident. Patient's wife remains at bedside.

## 2017-07-31 NOTE — Progress Notes (Signed)
  [facility]   MULTIDISCIPLINARY HEART VALVE TEAM  Pre TAVR CTs done today. Dr. Cyndia Bent will see tomorrow in consult. I have ordered PFTs.   Angelena Form PA-C  [facility]

## 2017-07-31 NOTE — Progress Notes (Signed)
Inpatient Rehabilitation  Continuing to follow at a distance for post acute therapy needs.  Note acute work up is on going.  Call if questions.   Carmelia Roller., CCC/SLP Admission Coordinator  [place]  Cell [phone]

## 2017-07-31 NOTE — Progress Notes (Signed)
ANTICOAGULATION CONSULT NOTE - Follow Up Consult  Pharmacy Consult:  Heparin Indication:  Aflutter  Patient Measurements: Height: 5\' 10"  (177.8 cm) Weight: 220 lb 14.4 oz (100.2 kg) IBW/kg (Calculated) : 73  Heparin dosing weight: 94 kg  Vital Signs: Temp: 98.4 F (36.9 C) (01/31 1906) Temp Source: Oral (01/31 1906) BP: 158/83 (01/31 1906) Pulse Rate: 62 (01/31 1906)  Labs: Recent Labs    07/29/17 1244 07/30/17 0558 07/31/17 0431 07/31/17 0956 07/31/17 1958  HGB 11.2* 11.1* 10.8*  --   --   HCT 35.7* 35.3* 34.7*  --   --   PLT 314 346 341  --   --   LABPROT 16.1* 16.4* 16.6*  --   --   INR 1.31 1.33 1.36  --   --   HEPARINUNFRC 0.24* 0.23*  --  0.22* 0.46  CREATININE 6.40* 7.22* 5.32*  --   --     Estimated Creatinine Clearance: 20.8 mL/min (A) (by C-G formula based on SCr of 5.32 mg/dL (H)).   Assessment: 5 YOM who was on Coumadin PTA for history of AFib.  Patient was transitioned to IV heparin for concern of Coumadin-induced skin necrosis vs calciphylaxis. Negative for DVT. S/p skin biopsy on 07/25/17.  Heparin level 0.46 at goal on heparin drip rate 2900 uts/hr.  No bleeding or issues with the drip noted per RN.    Goal of Therapy:  Heparin level 0.3-0.5 units/ml (concern for new CVA vs. septic emboli)  Monitor platelets by anticoagulation protocol: Yes    Plan:  Continue heparin gtt to 2900 units/hr Daily heparin level and CBC Monitor for s/sx bleeding  Bonnita Nasuti Pharm.D. CPP, BCPS Clinical Pharmacist [phone] 07/31/2017 9:22 PM

## 2017-08-01 ENCOUNTER — Ambulatory Visit: Payer: Medicare Other | Admitting: Vascular Surgery

## 2017-08-01 ENCOUNTER — Encounter (HOSPITAL_COMMUNITY): Payer: Self-pay

## 2017-08-01 DIAGNOSIS — I350 Nonrheumatic aortic (valve) stenosis: Secondary | ICD-10-CM

## 2017-08-01 LAB — GLUCOSE, CAPILLARY
GLUCOSE-CAPILLARY: 88 mg/dL (ref 65–99)
GLUCOSE-CAPILLARY: 102 mg/dL — AB (ref 65–99)
Glucose-Capillary: 89 mg/dL (ref 65–99)

## 2017-08-01 LAB — RENAL FUNCTION PANEL
Albumin: 2.5 g/dL — ABNORMAL LOW (ref 3.5–5.0)
Anion gap: 15 (ref 5–15)
BUN: 37 mg/dL — AB (ref 6–20)
CO2: 24 mmol/L (ref 22–32)
CREATININE: 6.86 mg/dL — AB (ref 0.61–1.24)
Calcium: 8.8 mg/dL — ABNORMAL LOW (ref 8.9–10.3)
Chloride: 95 mmol/L — ABNORMAL LOW (ref 101–111)
GFR calc Af Amer: 10 mL/min — ABNORMAL LOW (ref >60)
GFR calc non Af Amer: 9 mL/min — ABNORMAL LOW (ref >60)
GLUCOSE: 81 mg/dL (ref 65–99)
PHOSPHORUS: 6.8 mg/dL — AB (ref 2.5–4.6)
POTASSIUM: 4.5 mmol/L (ref 3.5–5.1)
Sodium: 134 mmol/L — ABNORMAL LOW (ref 135–145)

## 2017-08-01 LAB — CBC
HCT: 34.9 % — ABNORMAL LOW (ref 39.0–52.0)
HEMOGLOBIN: 10.7 g/dL — AB (ref 13.0–17.0)
MCH: 27.2 pg (ref 26.0–34.0)
MCHC: 30.7 g/dL (ref 30.0–36.0)
MCV: 88.8 fL (ref 78.0–100.0)
PLATELETS: 373 10*3/uL (ref 150–400)
RBC: 3.93 MIL/uL — ABNORMAL LOW (ref 4.22–5.81)
RDW: 18.8 % — ABNORMAL HIGH (ref 11.5–15.5)
WBC: 10.7 10*3/uL — AB (ref 4.0–10.5)

## 2017-08-01 LAB — HEPARIN LEVEL (UNFRACTIONATED): HEPARIN UNFRACTIONATED: 0.54 [IU]/mL (ref 0.30–0.70)

## 2017-08-01 LAB — PROTIME-INR
INR: 1.33
PROTHROMBIN TIME: 16.4 s — AB (ref 11.4–15.2)

## 2017-08-01 MED ORDER — CAMPHOR-MENTHOL 0.5-0.5 % EX LOTN
TOPICAL_LOTION | CUTANEOUS | Status: DC | PRN
  Administered 2017-08-01: 04:00:00 via TOPICAL
  Filled 2017-08-01: qty 222

## 2017-08-01 MED ORDER — SODIUM CHLORIDE 0.9 % IV SOLN: 100.0000 mL | INTRAVENOUS | Status: DC | PRN

## 2017-08-01 MED ORDER — HYDROXYZINE HCL 25 MG PO TABS
25.0000 mg | ORAL_TABLET | Freq: Once | ORAL | Status: AC
  Administered 2017-08-01: 25 mg via ORAL
  Filled 2017-08-01: qty 1

## 2017-08-01 NOTE — Progress Notes (Signed)
[facility] Progress Note   Subjective:    Seen while on HD, tolerating well.  Tired today.  No new complaints.  P worsened.   Objective Vitals:   08/01/17 0755 08/01/17 0802 08/01/17 0810 08/01/17 0815  BP: (!) 160/90 (!) 157/91 (!) 152/86 (!) 150/83  Pulse: 64 63 62 61  Resp: 16 16 15    Temp: 97.9 F (36.6 C)     TempSrc: Oral     SpO2: 94%     Weight: 100 kg (220 lb 7.4 oz)     Height:       Physical Exam General:NAD, chronically ill appearing, sleeping but responds to verbal stimuli Heart: RRR, +1/6 systolic murmur, heard throughout pericardium Lungs:CTAB anterior/laterally, BS diminished, nml WOB Abdomen:soft, obese, +BS Extremities: RLE: AKA, LLE: large area of eschar w/surrounding erythema on medal surface of calf - no edema Dialysis Access: L AVF cannulated  Filed Weights   07/31/17 0416 08/01/17 0437 08/01/17 0755  Weight: 100.2 kg (220 lb 14.4 oz) 100.2 kg (220 lb 14.4 oz) 100 kg (220 lb 7.4 oz)    Intake/Output Summary (Last 24 hours) at 08/01/2017 0831 Last data filed at 08/01/2017 0000 Gross per 24 hour  Intake 510.67 ml  Output -  Net 510.67 ml    Recent Labs  Lab 07/30/17 0558 07/31/17 0431 08/01/17 0407  NA 136 138 134*  K 4.2 3.9 4.5  CL 96* 98* 95*  CO2 22 26 24   GLUCOSE 110* 81 81  BUN 44* 26* 37*  CREATININE 7.22* 5.32* 6.86*  CALCIUM 8.4* 8.5* 8.8*  PHOS 5.9* 5.5* 6.8*    Recent Labs  Lab 07/30/17 0558 07/31/17 0431 08/01/17 0407  ALBUMIN 2.4* 2.4* 2.5*   CBC: Recent Labs  Lab 07/28/17 0705 07/29/17 1244 07/30/17 0558 07/31/17 0431 08/01/17 0407  WBC 10.6* 11.5* 11.0* 10.3 10.7*  HGB 11.2* 11.2* 11.1* 10.8* 10.7*  HCT 36.5* 35.7* 35.3* 34.7* 34.9*  MCV 89.2 88.1 88.3 88.5 88.8  PLT 330 314 346 341 373   Blood Culture    Component Value Date/Time   SDES BLOOD RIGHT HAND 07/19/2017 2330   SPECREQUEST  07/19/2017 2330    BOTTLES DRAWN AEROBIC ONLY Blood Culture adequate volume   CULT NO GROWTH 5 DAYS  07/19/2017 2330   REPTSTATUS 07/25/2017 FINAL 07/19/2017 2330     Recent Labs  Lab 07/30/17 1702 07/30/17 2132 07/31/17 0600 07/31/17 1144 08/01/17 0651  GLUCAP 81 110* 87 94 89    Lab Results  Component Value Date   INR 1.33 08/01/2017   INR 1.36 07/31/2017   INR 1.33 07/30/2017   Studies/Results: Ct Coronary Morph W/cta Cor W/score W/ca W/cm &/or Wo/cm  Addendum Date: 07/31/2017   ADDENDUM REPORT: 07/31/2017 16:23 ADDENDUM: Please see separate dictation for contemporaneously obtained CTA chest, abdomen and pelvis 07/31/2017 for full description of relevant extracardiac findings. Electronically Signed   By: Vinnie Langton M.D.   On: 07/31/2017 16:23   Result Date: 07/31/2017 CLINICAL DATA:  Aortic Stenosis EXAM: Cardiac TAVR CT TECHNIQUE: The patient was scanned on a Siemens Force 010 slice scanner. A 120 kV retrospective scan was triggered in the ascending thoracic aorta at 140 HU's. Gantry rotation speed was 250 msecs and collimation was .6 mm. No beta blockade or nitro were given. The 3D data set was reconstructed in 5% intervals of the R-R cycle. Systolic and diastolic phases were analyzed on a dedicated work station using MPR, MIP and VRT modes. The patient received 80 cc of  contrast. FINDINGS: Aortic Valve: Calcified tri-leaflet with restricted motion Aorta: Marked calcification of the arch and descending thoracic aorta Sino-tubular Junction: 31 mm Ascending Thoracic Aorta: 36 mm Aortic Arch: 30 mm Descending Thoracic Aorta: 29 mm Sinus of Valsalva Measurements: Non-coronary: 34.5 mm Right - coronary: 32.9 mm Left -   coronary: 32.7 mm Coronary Artery Height above Annulus: Left Main: 12.7 mm above annulus Right Coronary: 15 mm above annulus Virtual Basal Annulus Measurements: Maximum / Minimum Diameter: 24.2 mm x 31 mm Perimeter: 89 mm Area: 613 mm2 Coronary Arteries: Sufficient height above annulus for deployment Optimum Fluoroscopic Angle for Delivery: LAO 13 degrees Caudal 15  degrees IMPRESSION: 1. Calcified Tri-leaflet AV with annular area of 613 mm2 suitable for a 29 mm Sapien 3 valve 2. Marked calcific atherosclerosis of the aortic arch and descending thoracic aorta 3. Optimum angiographic angle for deployment LAO 13 degrees Caudal 15 degrees 4.  Coronary arteries sufficient height above annulus for deployment Overall poor quality study for measurements due to poor opacification Jenkins Rouge Electronically Signed: By: Jenkins Rouge M.D. On: 07/31/2017 15:51   Ct Angio Chest Aorta W/cm &/or Wo/cm  Result Date: 07/31/2017 CLINICAL DATA:  47 year old male with history of severe aortic stenosis. Preprocedural study prior to potential transcatheter aortic valve replacement (TAVR) procedure. EXAM: CT ANGIOGRAPHY CHEST, ABDOMEN AND PELVIS TECHNIQUE: Multidetector CT imaging through the chest, abdomen and pelvis was performed using the standard protocol during bolus administration of intravenous contrast. Multiplanar reconstructed images and MIPs were obtained and reviewed to evaluate the vascular anatomy. CONTRAST:  51mL ISOVUE-370 IOPAMIDOL (ISOVUE-370) INJECTION 76% COMPARISON:  CT abdomen and pelvis 02/07/2017.  Chest CT 11/15/2015. FINDINGS: CTA CHEST FINDINGS Cardiovascular: Heart size is enlarged with left ventricular hypertrophy. There is no significant pericardial fluid, thickening or pericardial calcification. There is aortic atherosclerosis, as well as atherosclerosis of the great vessels of the mediastinum and the coronary arteries, including calcified atherosclerotic plaque in the left main, left anterior descending, left circumflex and right coronary arteries. Severe calcifications of the aortic valve and mitral annulus right-sided pacemaker device with lead tips terminating in the right atrium and right ventricular apex. Mediastinum/Lymph Nodes: Multiple prominent borderline enlarged mediastinal and hilar lymph nodes are noted, but are nonspecific. Esophagus is unremarkable  in appearance. No axillary lymphadenopathy. Lungs/Pleura: Mild diffuse ground-glass attenuation and mild interlobular septal thickening, suggestive of a background of mild interstitial pulmonary edema. Trace right pleural effusion. No left pleural effusion. No consolidative airspace disease. No suspicious appearing pulmonary nodules or masses. Musculoskeletal/Soft Tissues: There are no aggressive appearing lytic or blastic lesions noted in the visualized portions of the skeleton. CTA ABDOMEN AND PELVIS FINDINGS Hepatobiliary: Liver has a slightly shrunken appearance and nodular contour, suggesting underlying cirrhosis. No definite suspicious appearing cystic or solid hepatic lesions. No intra or extrahepatic biliary ductal dilatation. Status post cholecystectomy. Pancreas: No pancreatic mass. No pancreatic ductal dilatation. No pancreatic or peripancreatic fluid or inflammatory changes. Spleen: Unremarkable. Adrenals/Urinary Tract: 2.0 x 1.5 cm fatty attenuation lesion in the left adrenal gland, compatible with a small adrenal myelolipoma. Right adrenal gland is normal. Mild atrophy of both kidneys. No suspicious appearing renal lesions. No hydroureteronephrosis. Urinary bladder is unremarkable in appearance. Stomach/Bowel: Normal appearance of the stomach. No pathologic dilatation of small bowel or colon. Normal appendix. Vascular/Lymphatic: Aortic atherosclerosis, with vascular findings and measurements pertinent to potential TAVR procedure, as detailed below. 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 Suboptimal contrast bolus on today's examination  limits accurate assessment of other major abdominal vessels for potential hemodynamically significant stenosis, but there is considerable atherosclerotic disease in these vessels, particularly in the proximal celiac axis and superior mesenteric artery, both of which likely demonstrate at least moderate stenosis. No  lymphadenopathy noted in the abdomen or pelvis. Reproductive: Prostate gland and seminal vesicles are unremarkable in appearance. Other: Trace volume of ascites.  No pneumoperitoneum. Musculoskeletal: There are no aggressive appearing lytic or blastic lesions noted in the visualized portions of the skeleton. VASCULAR MEASUREMENTS PERTINENT TO TAVR: AORTA: Minimal Aortic Diameter-10 x 13 mm Severity of Aortic Calcification-severe RIGHT PELVIS: Right Common Iliac Artery - Minimal Diameter-10.6 x 9.1 mm Tortuosity-mild Calcification-moderate to severe Right External Iliac Artery - Minimal Diameter-9.6 x 7.9 mm Tortuosity-mild Calcification-moderate Right Common Femoral Artery - Minimal Diameter-7.9 x 6.2 mm Tortuosity-mild Calcification-moderate LEFT PELVIS: Left Common Iliac Artery - Minimal Diameter-9.3 x 7.6 mm Tortuosity-mild Calcification-moderate to severe Left External Iliac Artery - Minimal Diameter-9.6 x 10.2 mm Tortuosity-mild Calcification-moderate Left Common Femoral Artery - Minimal Diameter-10.3 x 7.9 mm Tortuosity-mild Calcification-moderate Review of the MIP images confirms the above findings. IMPRESSION: 1. Vascular findings and measurements pertinent to potential TAVR procedure, as detailed above. 2. Severe thickening calcification of the aortic valve, compatible with the reported clinical history of severe aortic stenosis. 3. 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 4. Cardiomegaly with left ventricular hypertrophy. 5. 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 6. Aortic atherosclerosis, in addition to left main and 3 vessel coronary artery disease. Please note that although the presence of coronary artery calcium documents the presence of coronary artery disease, the severity of this disease and any potential stenosis cannot be assessed on this non-gated CT examination. Assessment for potential risk factor modification, dietary therapy or pharmacologic therapy may be warranted, if  clinically indicated. 7. Additional incidental findings, as above. Aortic Atherosclerosis (ICD10-I70.0). Electronically Signed   By: Vinnie Langton M.D.   On: 07/31/2017 16:59   Ct Angio Abd/pel W/ And/or W/o  Result Date: 07/31/2017 CLINICAL DATA:  47 year old male with history of severe aortic stenosis. Preprocedural study prior to potential transcatheter aortic valve replacement (TAVR) procedure. EXAM: CT ANGIOGRAPHY CHEST, ABDOMEN AND PELVIS TECHNIQUE: Multidetector CT imaging through the chest, abdomen and pelvis was performed using the standard protocol during bolus administration of intravenous contrast. Multiplanar reconstructed images and MIPs were obtained and reviewed to evaluate the vascular anatomy. CONTRAST:  72mL ISOVUE-370 IOPAMIDOL (ISOVUE-370) INJECTION 76% COMPARISON:  CT abdomen and pelvis 02/07/2017.  Chest CT 11/15/2015. FINDINGS: CTA CHEST FINDINGS Cardiovascular: Heart size is enlarged with left ventricular hypertrophy. There is no significant pericardial fluid, thickening or pericardial calcification. There is aortic atherosclerosis, as well as atherosclerosis of the great vessels of the mediastinum and the coronary arteries, including calcified atherosclerotic plaque in the left main, left anterior descending, left circumflex and right coronary arteries. Severe calcifications of the aortic valve and mitral annulus right-sided pacemaker device with lead tips terminating in the right atrium and right ventricular apex. Mediastinum/Lymph Nodes: Multiple prominent borderline enlarged mediastinal and hilar lymph nodes are noted, but are nonspecific. Esophagus is unremarkable in appearance. No axillary lymphadenopathy. Lungs/Pleura: Mild diffuse ground-glass attenuation and mild interlobular septal thickening, suggestive of a background of mild interstitial pulmonary edema. Trace right pleural effusion. No left pleural effusion. No consolidative airspace disease. No suspicious appearing  pulmonary nodules or masses. Musculoskeletal/Soft Tissues: There are no aggressive appearing lytic or blastic lesions noted in the visualized portions of the skeleton. CTA ABDOMEN AND  PELVIS FINDINGS Hepatobiliary: Liver has a slightly shrunken appearance and nodular contour, suggesting underlying cirrhosis. No definite suspicious appearing cystic or solid hepatic lesions. No intra or extrahepatic biliary ductal dilatation. Status post cholecystectomy. Pancreas: No pancreatic mass. No pancreatic ductal dilatation. No pancreatic or peripancreatic fluid or inflammatory changes. Spleen: Unremarkable. Adrenals/Urinary Tract: 2.0 x 1.5 cm fatty attenuation lesion in the left adrenal gland, compatible with a small adrenal myelolipoma. Right adrenal gland is normal. Mild atrophy of both kidneys. No suspicious appearing renal lesions. No hydroureteronephrosis. Urinary bladder is unremarkable in appearance. Stomach/Bowel: Normal appearance of the stomach. No pathologic dilatation of small bowel or colon. Normal appendix. Vascular/Lymphatic: Aortic atherosclerosis, with vascular findings and measurements pertinent to potential TAVR procedure, as detailed below. 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 Suboptimal contrast bolus on today's examination limits accurate assessment of other major abdominal vessels for potential hemodynamically significant stenosis, but there is considerable atherosclerotic disease in these vessels, particularly in the proximal celiac axis and superior mesenteric artery, both of which likely demonstrate at least moderate stenosis. No lymphadenopathy noted in the abdomen or pelvis. Reproductive: Prostate gland and seminal vesicles are unremarkable in appearance. Other: Trace volume of ascites.  No pneumoperitoneum. Musculoskeletal: There are no aggressive appearing lytic or blastic lesions noted in the visualized portions of the skeleton. VASCULAR  MEASUREMENTS PERTINENT TO TAVR: AORTA: Minimal Aortic Diameter-10 x 13 mm Severity of Aortic Calcification-severe RIGHT PELVIS: Right Common Iliac Artery - Minimal Diameter-10.6 x 9.1 mm Tortuosity-mild Calcification-moderate to severe Right External Iliac Artery - Minimal Diameter-9.6 x 7.9 mm Tortuosity-mild Calcification-moderate Right Common Femoral Artery - Minimal Diameter-7.9 x 6.2 mm Tortuosity-mild Calcification-moderate LEFT PELVIS: Left Common Iliac Artery - Minimal Diameter-9.3 x 7.6 mm Tortuosity-mild Calcification-moderate to severe Left External Iliac Artery - Minimal Diameter-9.6 x 10.2 mm Tortuosity-mild Calcification-moderate Left Common Femoral Artery - Minimal Diameter-10.3 x 7.9 mm Tortuosity-mild Calcification-moderate Review of the MIP images confirms the above findings. IMPRESSION: 1. Vascular findings and measurements pertinent to potential TAVR procedure, as detailed above. 2. Severe thickening calcification of the aortic valve, compatible with the reported clinical history of severe aortic stenosis. 3. 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 4. Cardiomegaly with left ventricular hypertrophy. 5. 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 6. Aortic atherosclerosis, in addition to left main and 3 vessel coronary artery disease. Please note that although the presence of coronary artery calcium documents the presence of coronary artery disease, the severity of this disease and any potential stenosis cannot be assessed on this non-gated CT examination. Assessment for potential risk factor modification, dietary therapy or pharmacologic therapy may be warranted, if clinically indicated. 7. Additional incidental findings, as above. Aortic Atherosclerosis (ICD10-I70.0). Electronically Signed   By: Vinnie Langton M.D.   On: 07/31/2017 16:59    Medications: . sodium chloride    . sodium chloride    . sodium chloride    . sodium chloride    . ferric gluconate (FERRLECIT/NULECIT)  IV    . heparin 2,900 Units/hr (08/01/17 0000)  . sodium thiosulfate infusion for calciphylaxis     . amLODipine  10 mg Oral Daily  . aspirin  81 mg Oral Daily  . atorvastatin  80 mg Oral q1800  . carvedilol  25 mg Oral BID WC  . cinacalcet  90 mg Oral Q breakfast  . cloNIDine  0.2 mg Transdermal Q Tue  . divalproex  500 mg Oral Q12H  . escitalopram  10  mg Oral Daily  . ferric citrate  630 mg Oral TID WC  . gabapentin  100 mg Oral BID  . hydrALAZINE  100 mg Oral Q8H  . irbesartan  300 mg Oral Daily  . isosorbide dinitrate  10 mg Oral BID  . multivitamin  1 tablet Oral QHS  . nicotine  21 mg Transdermal Daily  . sevelamer carbonate  2,400 mg Oral TID WC  . 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 Na thiosulfate started today w/HD. VERY poor P control/nonadherence to binders as outpt. On sensipar dose ^90. Now on Renvela powder and Auryxia.  P 5.5>6.8 today. Cor Ca 10. VDRA stopped. Importance of binder compliance stressed. 2. ESRD - HD today per regular schedule, continue MWF schedule. Na thiosulfate with HD starting 2/1. Does not appear volume overloaded on exam. Continue to titrate down volume as tolerated.  3. CM/CHF/MS/severe AS - Cards following. newly worsened LV fxn (EF  35-40%) Per cards unclear if he is even candidate for TAVR (Dr. Cyndia Bent to see and if that is case Dr. Stanford Breed says needs to consider [facility]) 4. CAD - s/p cardiac cath showing 2 vessel CAD, medical therapy - ASA & statin, per cards 5. 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 1-39% b/l ICA stenosis, no  thrombus on ECHO 5. Anemia of CKD- Hgb 10.7. Stable. 6. HTN - controlled with multiple meds. 7. Nutrition - Renal diet with fluid restricions. Low albumin. Getting outside food w/o binders - which is an issue.  8. A. Flutter - on heparin, will need long term anticoagulation  9. PVD - R AKA 10. DM2 - per primary 11. Bipolar - per primary 12. Tobacco abuse  Jen Mow, PA-C [facility] Pager: [phone] 08/01/2017,8:31 AM  LOS: 12 days   I have seen and examined this patient and agree with plan and assessment in the above note with renal recommendations/intervention highlighted. Starting Na thiosulfate today. Phos increasing despite forced compliance (eats food from outside with no binders). Unclear if candidate for TAVR per cardiology (Dr. Cyndia Bent to see today). If not they say should consider Hospice   Kassi Esteve B,MD 08/01/2017 11:51 AM

## 2017-08-01 NOTE — Procedures (Signed)
I have personally attended this patient's dialysis session.   Pre weight 100 kg (EDW 98) UFG 3.5 liters No AVF issues 2K bath  Jamal Maes, MD [facility] [phone] Pager 08/01/2017, 11:56 AM

## 2017-08-01 NOTE — Progress Notes (Signed)
ANTICOAGULATION CONSULT NOTE - Follow Up Consult  Pharmacy Consult:  Heparin Indication:  Aflutter  Patient Measurements: Height: 5\' 10"  (177.8 cm) Weight: 220 lb 7.4 oz (100 kg) IBW/kg (Calculated) : 73  Heparin dosing weight: 94 kg  Vital Signs: Temp: 97.9 F (36.6 C) (02/01 0755) Temp Source: Oral (02/01 0755) BP: 143/77 (02/01 0830) Pulse Rate: 62 (02/01 0830)  Labs: Recent Labs    07/30/17 0558 07/31/17 0431 07/31/17 0956 07/31/17 1958 08/01/17 0407  HGB 11.1* 10.8*  --   --  10.7*  HCT 35.3* 34.7*  --   --  34.9*  PLT 346 341  --   --  373  LABPROT 16.4* 16.6*  --   --  16.4*  INR 1.33 1.36  --   --  1.33  HEPARINUNFRC 0.23*  --  0.22* 0.46 0.54  CREATININE 7.22* 5.32*  --   --  6.86*    Estimated Creatinine Clearance: 16.1 mL/min (A) (by C-G formula based on SCr of 6.86 mg/dL (H)).   Assessment: 64 YOM who was on Coumadin PTA for history of AFib.  Patient was transitioned to IV heparin for concern of Coumadin-induced skin necrosis vs calciphylaxis. Negative for DVT. S/p skin biopsy on 07/25/17.  Heparin level this morning is 0.54 and slightly above the lower goal of 0.3-0.5. Will reduce slightly and follow-up with AM labs on 2/2 since so close to goal. No bleeding noted at this time.   Goal of Therapy:  Heparin level 0.3-0.5 units/ml (concern for new CVA vs. septic emboli)  Monitor platelets by anticoagulation protocol: Yes    Plan:  1. Reduce Heparin drip rate slightly to 2850 units/hr (28.5 ml/hr) 2. Will f/u with CVTS evaluation for possible surgery 3. Will continue to monitor for any signs/symptoms of bleeding and will follow up with heparin level in the a.m.   Thank you for allowing pharmacy to be a part of this patient's care.  Alycia Rossetti, PharmD, BCPS Clinical Pharmacist Pager: [phone] Clinical phone for 08/01/2017 from 7a-3:30p: [phone] If after 3:30p, please call main pharmacy at: [phone] 08/01/2017 9:02 AM

## 2017-08-01 NOTE — Consult Note (Signed)
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 A-P, ES                           51     mm       ---------  LA ID/bsa, A-P                   (H)     2.28   cm/m^2   <=2.2  LA volume, S                             98.7   ml       ---------  LA volume/bsa, S                         44.2   ml/m^2   ---------  LA volume, ES, 1-p A4C                   95.5   ml       ---------  LA volume/bsa, ES, 1-p A4C  42.8   ml/m^2   ---------  LA volume, ES, 1-p A2C                   98     ml       ---------  LA volume/bsa, ES, 1-p A2C               43.9   ml/m^2   ---------    Mitral valve                             Value           Reference  Mitral E-wave peak velocity              164.75 cm/s     ---------  Mitral mean velocity, D                  138    cm/s     ---------  Mitral deceleration slope                399.27 cm/s^2    ---------  Mitral deceleration time         (H)     413    ms       150 - 230  Mitral pressure half-time                120    ms       ---------  Mitral mean gradient, D                  12     mm Hg    ---------  Mitral peak gradient, D                  11     mm Hg    ---------  Mitral valve area, PHT, DP               1.84   cm^2     ---------  Mitral valve area/bsa, PHT, DP           0.82   cm^2/m^2 ---------  Mitral valve area, LVOT                  0.95   cm^2     ---------  continuity  Mitral valve area/bsa, LVOT              0.43   cm^2/m^2 ---------  continuity  Mitral annulus VTI, D                    52.3   cm       ---------    Pulmonary arteries                       Value           Reference  PA pressure, S, DP               (H)     32     mm Hg    <=30    Tricuspid valve                          Value           Reference  Tricuspid regurg peak velocity  244    cm/s     ---------  Tricuspid peak RV-RA gradient            24     mm Hg    ---------    Systemic veins                           Value           Reference  Estimated CVP                            8      mm Hg    ---------    Right ventricle                          Value           Reference  RV ID, minor axis, ED, A4C base          50     mm       ---------  TAPSE                                    16.9   mm       ---------  RV pressure, S, DP               (H)     32     mm Hg    <=30  RV s&', lateral, S                        9.46   cm/s     ---------  Legend: (L)  and  (H)  mark values outside specified reference range.  ------------------------------------------------------------------- Prepared and Electronically Authenticated by  Kirk Ruths 2019-01-24T15:30:56   Physicians   Panel Physicians Referring Physician Case Authorizing Physician  Martinique, Peter M, MD (Primary)    Procedures   RIGHT/LEFT HEART CATH AND CORONARY ANGIOGRAPHY  Conclusion     RV marginal branch is  99% stenosed. this is a small branch.  Mid RCA lesion is 70% stenosed.  Post Atrio lesion is 95% stenosed. This is a small vessel.  Ost 1st Diag lesion is 90% stenosed.  Mid LAD lesion is 50% stenosed.  Dist LAD lesion is 90% stenosed.  Ost Cx to Prox Cx lesion is 50% stenosed.  The left ventricular systolic function is normal.  LV end diastolic pressure is mildly elevated.  The left ventricular ejection fraction is 55-65% by visual estimate.  There is moderate to severe Aortic stenosis. Peak gradient 69 mm Hg, mean gradient 42 mm Hg. valve area 1.2 cm squared with index .55  There is moderate mitral valve stenosis. MV mean gradient of 10 mm Hg. valve area 1.98 cm squared. index 0.9.  Hemodynamic findings consistent with moderate pulmonary hypertension.   1. 2 vessel obstructive CAD. Heavily calcified vessels with predominantly small vessel disease. 2. Moderate to severe Aortic stenosis.  3. Moderate mitral stenosis. 4. Moderate pulmonary HTN. 5. Normal cardiac output.  6. Normal LV function  Plan: The CAD is probably best treated medically. Will review hemodynamic data with multidisciplinary valve team.    Indications   Severe aortic stenosis [I35.0 (ICD-10-CM)]  Procedural Details/Technique   Technical Details Indication: 47 yo dialysis patient with  severe AS and moderate MS by Echo. Evaluated for potential valve replacement.  Procedural Details: The right groin was prepped, draped, and anesthetized with 1% lidocaine. Using the modified Seldinger technique and ultrasound guidance a 5 Fr sheath was placed in the right femoral artery and a 7 French sheath was placed in the right femoral vein. A Swan-Ganz catheter was used for the right heart catheterization. Standard protocol was followed for recording of right heart pressures and sampling of oxygen saturations. Fick cardiac output was calculated. Standard Judkins catheters were used for selective coronary angiography and  left ventriculography. There were no immediate procedural complications. The patient was transferred to the post catheterization recovery area for further monitoring. Contrast: 100 cc   Estimated blood loss <50 mL.  During this procedure the patient was administered the following to achieve and maintain moderate conscious sedation: Versed 2 mg, Fentanyl 50 mcg, while the patient's heart rate, blood pressure, and oxygen saturation were continuously monitored. The period of conscious sedation was 35 minutes, of which I was present face-to-face 100% of this time.  Complications   Complications documented before study signed (07/30/2017 4:51 PM EST)    No complications were associated with this study.  Documented by Martinique, Peter M, MD - 07/30/2017 4:44 PM EST    Coronary Findings   Diagnostic  Dominance: Right  Left Anterior Descending  Vessel is large.  Mid LAD lesion 50% stenosed  Mid LAD lesion is 50% stenosed.  Dist LAD lesion 90% stenosed  Dist LAD lesion is 90% stenosed. The lesion is severely calcified.  First Diagonal Branch  Vessel is small in size.  Ost 1st Diag lesion 90% stenosed  Ost 1st Diag lesion is 90% stenosed.  Left Circumflex  Ost Cx to Prox Cx lesion 50% stenosed  Ost Cx to Prox Cx lesion is 50% stenosed. The lesion is severely calcified.  Right Coronary Artery  Mid RCA lesion 70% stenosed  Mid RCA lesion is 70% stenosed. The lesion is severely calcified.  Right Posterior Atrioventricular Branch  Post Atrio lesion 95% stenosed  Post Atrio lesion is 95% stenosed.  First Right Posterolateral  Vessel is small in size.  Intervention   No interventions have been documented.  Right Heart   Right Heart Pressures Hemodynamic findings consistent with moderate pulmonary hypertension.  Wall Motion      All segments of the heart are normal.          Left Heart   Left Ventricle The left ventricular size is normal. The left ventricular systolic function is  normal. LV end diastolic pressure is mildly elevated. The left ventricular ejection fraction is 55-65% by visual estimate. No regional wall motion abnormalities.  Mitral Valve There is moderate mitral valve stenosis. The annulus is calcified.  Aortic Valve The aortic valve is calcified. There is restricted aortic valve motion.  Coronary Diagrams   Diagnostic Diagram       Implants     No implant documentation for this case.  MERGE Images   Show images for CARDIAC CATHETERIZATION   Link to Procedure Log   Procedure Log    Hemo Data    Most Recent Value  Fick Cardiac Output 6.59 L/min  Fick Cardiac Output Index 3.04 (L/min)/BSA  Aortic Mean Gradient 42 mmHg  Aortic Peak Gradient 55 mmHg  Aortic Valve Area 1.20  Aortic Value Area Index 0.55 cm2/BSA  Mitral Mean Gradient 10.2 mmHg  Mitral Peak Gradient 2 mmHg  Mitral Valve Area Index 0.91 cm2/BSA  RA  A Wave 20 mmHg  RA V Wave 19 mmHg  RA Mean 15 mmHg  RV Systolic Pressure 57 mmHg  RV Diastolic Pressure 8 mmHg  RV EDP 18 mmHg  PA Systolic Pressure 54 mmHg  PA Diastolic Pressure 24 mmHg  PA Mean 36 mmHg  PW A Wave 29 mmHg  PW V Wave 30 mmHg  PW Mean 25 mmHg  AO Systolic Pressure 376 mmHg  AO Diastolic Pressure 79 mmHg  AO Mean 283 mmHg  LV Systolic Pressure 151 mmHg  LV Diastolic Pressure 9 mmHg  LV EDP 23 mmHg  Arterial Occlusion Pressure Extended Systolic Pressure 761 mmHg  Arterial Occlusion Pressure Extended Diastolic Pressure 69 mmHg  Arterial Occlusion Pressure Extended Mean Pressure 103 mmHg  Left Ventricular Apex Extended Systolic Pressure 607 mmHg  Left Ventricular Apex Extended Diastolic Pressure 23 mmHg  Left Ventricular Apex Extended EDP Pressure 31 mmHg  QP/QS 1  TPVR Index 11.85 HRUI  TSVR Index 35.55 HRUI  PVR SVR Ratio 0.14  TPVR/TSVR Ratio 0.33    ADDENDUM REPORT: 07/31/2017 16:23  ADDENDUM: Please see separate dictation for contemporaneously obtained CTA chest, abdomen and pelvis  07/31/2017 for full description of relevant extracardiac findings.   Electronically Signed   By: Vinnie Langton M.D.   On: 07/31/2017 16:23   Addended by Etheleen Mayhew, MD on 07/31/2017 4:26 PM    Study Result   CLINICAL DATA:  Aortic Stenosis  EXAM: Cardiac TAVR CT  TECHNIQUE: The patient was scanned on a Siemens Force 371 slice scanner. A 120 kV retrospective scan was triggered in the ascending thoracic aorta at 140 HU's. Gantry rotation speed was 250 msecs and collimation was .6 mm. No beta blockade or nitro were given. The 3D data set was reconstructed in 5% intervals of the R-R cycle. Systolic and diastolic phases were analyzed on a dedicated work station using MPR, MIP and VRT modes. The patient received 80 cc of contrast.  FINDINGS: Aortic Valve: Calcified tri-leaflet with restricted motion  Aorta: Marked calcification of the arch and descending thoracic aorta  Sino-tubular Junction: 31 mm  Ascending Thoracic Aorta: 36 mm  Aortic Arch: 30 mm  Descending Thoracic Aorta: 29 mm  Sinus of Valsalva Measurements:  Non-coronary: 34.5 mm  Right - coronary: 32.9 mm  Left -   coronary: 32.7 mm  Coronary Artery Height above Annulus:  Left Main: 12.7 mm above annulus  Right Coronary: 15 mm above annulus  Virtual Basal Annulus Measurements:  Maximum / Minimum Diameter: 24.2 mm x 31 mm  Perimeter: 89 mm  Area: 613 mm2  Coronary Arteries: Sufficient height above annulus for deployment  Optimum Fluoroscopic Angle for Delivery: LAO 13 degrees Caudal 15 degrees  IMPRESSION: 1. Calcified Tri-leaflet AV with annular area of 613 mm2 suitable for a 29 mm Sapien 3 valve  2. Marked calcific atherosclerosis of the aortic arch and descending thoracic aorta  3. Optimum angiographic angle for deployment LAO 13 degrees Caudal 15 degrees  4.  Coronary arteries sufficient height above annulus for deployment  Overall poor  quality study for measurements due to poor opacification  Jenkins Rouge  Electronically Signed: By: Jenkins Rouge M.D. On: 07/31/2017 15:51       CLINICAL DATA:  47 year old male with history of severe aortic stenosis. Preprocedural study prior to potential transcatheter aortic valve replacement (TAVR) procedure.  EXAM: CT ANGIOGRAPHY CHEST, ABDOMEN AND PELVIS  TECHNIQUE: Multidetector CT imaging through the chest, abdomen and pelvis was performed using the standard protocol during  bolus administration of intravenous contrast. Multiplanar reconstructed images and MIPs were obtained and reviewed to evaluate the vascular anatomy.  CONTRAST:  22m ISOVUE-370 IOPAMIDOL (ISOVUE-370) INJECTION 76%  COMPARISON:  CT abdomen and pelvis 02/07/2017.  Chest CT 11/15/2015.  FINDINGS: CTA CHEST FINDINGS  Cardiovascular: Heart size is enlarged with left ventricular hypertrophy. There is no significant pericardial fluid, thickening or pericardial calcification. There is aortic atherosclerosis, as well as atherosclerosis of the great vessels of the mediastinum and the coronary arteries, including calcified atherosclerotic plaque in the left main, left anterior descending, left circumflex and right coronary arteries. Severe calcifications of the aortic valve and mitral annulus right-sided pacemaker device with lead tips terminating in the right atrium and right ventricular apex.  Mediastinum/Lymph Nodes: Multiple prominent borderline enlarged mediastinal and hilar lymph nodes are noted, but are nonspecific. Esophagus is unremarkable in appearance. No axillary lymphadenopathy.  Lungs/Pleura: Mild diffuse ground-glass attenuation and mild interlobular septal thickening, suggestive of a background of mild interstitial pulmonary edema. Trace right pleural effusion. No left pleural effusion. No consolidative airspace disease. No suspicious appearing pulmonary nodules or  masses.  Musculoskeletal/Soft Tissues: There are no aggressive appearing lytic or blastic lesions noted in the visualized portions of the skeleton.  CTA ABDOMEN AND PELVIS FINDINGS  Hepatobiliary: Liver has a slightly shrunken appearance and nodular contour, suggesting underlying cirrhosis. No definite suspicious appearing cystic or solid hepatic lesions. No intra or extrahepatic biliary ductal dilatation. Status post cholecystectomy.  Pancreas: No pancreatic mass. No pancreatic ductal dilatation. No pancreatic or peripancreatic fluid or inflammatory changes.  Spleen: Unremarkable.  Adrenals/Urinary Tract: 2.0 x 1.5 cm fatty attenuation lesion in the left adrenal gland, compatible with a small adrenal myelolipoma. Right adrenal gland is normal. Mild atrophy of both kidneys. No suspicious appearing renal lesions. No hydroureteronephrosis. Urinary bladder is unremarkable in appearance.  Stomach/Bowel: Normal appearance of the stomach. No pathologic dilatation of small bowel or colon. Normal appendix.  Vascular/Lymphatic: Aortic atherosclerosis, with vascular findings and measurements pertinent to potential TAVR procedure, as detailed below. 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 Suboptimal contrast bolus on today's examination limits accurate assessment of other major abdominal vessels for potential hemodynamically significant stenosis, but there is considerable atherosclerotic disease in these vessels, particularly in the proximal celiac axis and superior mesenteric artery, both of which likely demonstrate at least moderate stenosis. No lymphadenopathy noted in the abdomen or pelvis.  Reproductive: Prostate gland and seminal vesicles are unremarkable in appearance.  Other: Trace volume of ascites.  No pneumoperitoneum.  Musculoskeletal: There are no aggressive appearing lytic or blastic lesions noted in the  visualized portions of the skeleton.  VASCULAR MEASUREMENTS PERTINENT TO TAVR:  AORTA:  Minimal Aortic Diameter-10 x 13 mm  Severity of Aortic Calcification-severe  RIGHT PELVIS:  Right Common Iliac Artery -  Minimal Diameter-10.6 x 9.1 mm  Tortuosity-mild  Calcification-moderate to severe  Right External Iliac Artery -  Minimal Diameter-9.6 x 7.9 mm  Tortuosity-mild  Calcification-moderate  Right Common Femoral Artery -  Minimal Diameter-7.9 x 6.2 mm  Tortuosity-mild  Calcification-moderate  LEFT PELVIS:  Left Common Iliac Artery -  Minimal Diameter-9.3 x 7.6 mm  Tortuosity-mild  Calcification-moderate to severe  Left External Iliac Artery -  Minimal Diameter-9.6 x 10.2 mm  Tortuosity-mild  Calcification-moderate  Left Common Femoral Artery -  Minimal Diameter-10.3 x 7.9 mm  Tortuosity-mild  Calcification-moderate  Review of the MIP images confirms the above findings.  IMPRESSION: 1. Vascular findings and measurements  pertinent to potential TAVR procedure, as detailed above. 2. Severe thickening calcification of the aortic valve, compatible with the reported clinical history of severe aortic stenosis. 3. 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 4. Cardiomegaly with left ventricular hypertrophy. 5. 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 6. Aortic atherosclerosis, in addition to left main and 3 vessel coronary artery disease. Please note that although the presence of coronary artery calcium documents the presence of coronary artery disease, the severity of this disease and any potential stenosis cannot be assessed on this non-gated CT examination. Assessment for potential risk factor modification, dietary therapy or pharmacologic therapy may be warranted, if clinically indicated. 7. Additional incidental findings, as above.  Aortic Atherosclerosis (ICD10-I70.0).   Electronically  Signed   By: Vinnie Langton M.D.   On: 07/31/2017 16:59  Impression/Plan:  This 47 year old gentleman has stage D, severe, symptomatic aortic stenosis and at least moderate mitral stenosis with a mean transmitral of 12 mmHg.  He has moderate left ventricular systolic dysfunction with ejection fraction of 35-40%.  His cardiac catheterization shows severe multivessel coronary disease with heavily calcified coronary arteries and predominantly small vessel disease.  Right heart catheterization showed moderate pulmonary hypertension.  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  I would not consider him a candidate for open surgery.  He could potentially be a candidate for transcatheter aortic valve replacement although this would not do anything for his mitral stenosis and I would not consider him a candidate for TAVR at this time with the necrotic skin and surrounding erythema in the left lower leg.  I think he is at risk of progression of this process with further infection and potentially loss of his left lower extremity.  I doubt that transcatheter aortic valve replacement is going to significantly improve his survival.  His symptoms are relatively mild at this time and he is very inactive due to his general medical condition and right leg amputation.  I would recommend not doing anything at this time and seeing what course his left lower extremity takes over the next few weeks to months.  I have personally reviewed his gated cardiac CT and CTA of the abdomen and pelvis.  His gated cardiac CT shows anatomy suitable for transcatheter aortic valve replacement using a Sapien 3 valve.  The CTA of  his chest, shows extensive calcific vascular disease involving the aortic arch, descending aorta, and abdominal.  His pelvic arterial anatomy appears adequate to allow transfemoral insertion.  I would discuss his case again with the other members of the heart valve team but I would not recommend TAVR at this time due to the concerns about his left lower extremity.   I spent 60 minutes performing this consultation and > 50% of this time was spent face to face counseling and coordinating the care of this patient's severe aortic and moderate to severe mitral stenosis.    Gaye Pollack, MD 08/01/2017

## 2017-08-01 NOTE — Progress Notes (Signed)
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 XR showed calcified vessels without evidence of emphysema or fracture. Vancomycin and zosyn were given, nephrology consulted for HD while inpatient, and he was admitted. Fistulogram performed 1/22 showed widely patent AVF circuit. The patient grew more lethargic following the procedure. Neurology was consulted, sedating medications held. EEG showed nonspecific slowing. MRI showed multiple small supra- and infratentorial diffusion abnormalities most consistent with infarcts spanning multiple vascular territories. Work up of stroke included echocardiogram which showed severe aortic and mitral stenosis as well as new wall motion abnormalities. Cardiology was consulted and CT surgery evaluation is pending to decide on further actions.      Assessment & Plan:   Active Problems:   History of partial Right nephrectomy for renal mass (2008)   End stage renal disease on dialysis 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 U/S negative for DVT. Blood cultures negative.  - Erythema worsening/stable despite many days of broad spectrum abx.  -this is all related to calciphylaxis.  -  biopsy results from 1/25, showed VASCULITIS WITH CALCIFICATION MOST CONSISTENT WITH CALCIPHYLAXIS. - Holding coumadin, continue heparin, holding Ca. Phos binder per nephrology. Pt not adherent.  - Pain control,  with oxycodone, decreased dose with lethargy. -ID consulted. They evaluated patient on 1-28 recommend to stop antibiotics. Diagnosis consistent with calciphylaxis.  -started on renvela and thiosulfate. Appreciate Dr Lorrene Reid help.  -ok to resume coumadin, when appropriated, discussed with Dr Lorrene Reid.   CVA: Seen on motion-degraded MRI. Multiple vascular distributions involved consistent with cardioembolic/septic emboli. Echo showed no thrombus. Carotid U/S limited by pt movement showed 1-39% stenosis of bilateral ICA's. 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  - Stroke team signed off, no new recommendations at this time.  - Continue statin - IV heparin, will require ongoing anticoagulation.   Acute systolic CHF with severe aortic stenosis, mitral stenosis: Echo with newly depressed LVEF to 35-40% from 60-65% with akinesis of mid-apicalanteroseptal myocardium. No wall motion abnormalities on last echo in Aug 2018. Also showed severe aortic stenosis, mitral stenosis. Pt has no chest pain. -cath , 2 vessels CAD. Medical management  -CT angio, coronary amenable for TAVAR  -evaluated by Dr Cyndia Bent, patient is too high risk for open heart surgery, no candidate for TAVR at this time due to concerns with his LE issues. Recommend to follow course of LE calciphylaxis ober few weeks , months.,   ESRD on hemodialysiswith mild hyperkalemia and mild anemia - Nephrology providing HD on schedule.  Acute metabolic encephalopathy: Resolving. With waxing/waning course, suspect  delirium worsened by sedating medications and CVA. EEG 1/23 nonspecific slowing without epileptiform discharges.  - Ammonia mildly elevated, gave lactulose, though had significant diarrhea and mental status improved. Holding for now.  - Hold sedating medications as able.  -oxycodone dose decreased.   Above knee amputation of right lower extremity - PT evaluation, eventual dispo to CIR anticipated.  Complete heart block: s/p PPM placement Nov 2018.  - No signs pacemaker is malfunctioning.   Atrial flutter: CHA2DS2-VASc Scoreis 5 - continue coreg. - Heparin per pharmacy, stopped coumadin  Hyperlipidemia: - Continue statin.   Bipolar/depression:  - Continue depakote and lexapro     DVT prophylaxis: IV heparin.  Code Status: full code.  Family Communication: care discussed with patient and wife Disposition Plan: to be determine. CIR vs SNF  Consultants:   Nephrology  IR  Neurology  General surgery  Cardiology  Cardiothoracic surgery  ID       Procedures:   HD Echocardiogram 07/24/2017: Akinesis of the distal anteroseptal wall and apex with overall moderate LV dysfunction; severe LVH; heavily calcified aortic valve with severe AS (mean gradient 60 mmHg); severe MAC with severe MS (mean gradient 12 mmHg); moderate LAE; moderate RVE; mild TR.     Antimicrobials:    Subjective: I saw patient during HD, he is sleepy, but answer questions, follows command   Objective: Vitals:   08/01/17 1200 08/01/17 1203 08/01/17 1241 08/01/17 1459  BP: (!) 149/68 (!) 152/74 136/61 (!) 158/74  Pulse: 63 62 75 67  Resp:  16 16 19   Temp:  98.6 F (37 C) 98.5 F (36.9 C) 98.7 F (37.1 C)  TempSrc:  Oral Oral Oral  SpO2:  95% 95% 95%  Weight:  96.7 kg (213 lb 3 oz)    Height:        Intake/Output Summary (Last 24 hours) at 08/01/2017 1519 Last data filed at 08/01/2017 1300 Gross per 24 hour  Intake 735.67 ml  Output 3500 ml  Net -2764.33 ml    Filed Weights   08/01/17 0437 08/01/17 0755 08/01/17 1203  Weight: 100.2 kg (220 lb 14.4 oz) 100 kg (220 lb 7.4 oz) 96.7 kg (213 lb 3 oz)    Examination:  General exam: NAD, sleepy  Respiratory system: CTA Cardiovascular system: S 1, S 2 RRR Gastrointestinal system: BS present soft, nt Central nervous system: sleepy, keep yes close but answer questions, and follows command  Extremities: right AKA, left LE with erythema and superficial necrosis.  Skin: erythema and necrosis, stab;e.    Data Reviewed: I have personally reviewed following labs and imaging studies  CBC: Recent Labs  Lab 07/28/17 0705 07/29/17 1244 07/30/17 0558 07/31/17 0431 08/01/17 0407  WBC 10.6* 11.5* 11.0* 10.3 10.7*  HGB 11.2* 11.2* 11.1* 10.8* 10.7*  HCT 36.5* 35.7* 35.3* 34.7* 34.9*  MCV 89.2 88.1 88.3 88.5 88.8  PLT 330 314 346 341 681   Basic Metabolic Panel: Recent Labs  Lab 07/28/17 0705 07/29/17 1244 07/30/17 0558 07/31/17 0431 08/01/17 0407  NA 135 134* 136 138 134*  K 4.6 4.2 4.2 3.9 4.5  CL 93* 94* 96* 98* 95*  CO2 25 24 22 26 24   GLUCOSE 99 104* 110* 81 81  BUN 50* 34* 44* 26* 37*  CREATININE 8.02* 6.40* 7.22* 5.32* 6.86*  CALCIUM 8.4* 8.4* 8.4* 8.5* 8.8*  PHOS 7.8* 5.3* 5.9* 5.5* 6.8*   GFR: Estimated Creatinine Clearance: 15.9 mL/min (A) (by C-G formula based  on SCr of 6.86 mg/dL (H)). Liver Function Tests: Recent Labs  Lab 07/28/17 0705 07/29/17 1244 07/30/17 0558 07/31/17 0431 08/01/17 0407  ALBUMIN 2.4* 2.5* 2.4* 2.4* 2.5*   No results for input(s): LIPASE, AMYLASE in the last 168 hours. No results for input(s): AMMONIA in the last 168 hours. Coagulation Profile: Recent Labs  Lab 07/28/17 0705 07/29/17 1244 07/30/17 0558 07/31/17 0431 08/01/17 0407  INR 1.29 1.31 1.33 1.36 1.33   Cardiac Enzymes: No results for input(s): CKTOTAL, CKMB, CKMBINDEX, TROPONINI in the last 168 hours. BNP (last 3 results) No results for input(s): PROBNP in the last 8760  hours. HbA1C: No results for input(s): HGBA1C in the last 72 hours. CBG: Recent Labs  Lab 07/30/17 2132 07/31/17 0600 07/31/17 1144 08/01/17 0651 08/01/17 0928  GLUCAP 110* 87 94 89 88   Lipid Profile: No results for input(s): CHOL, HDL, LDLCALC, TRIG, CHOLHDL, LDLDIRECT in the last 72 hours. Thyroid Function Tests: No results for input(s): TSH, T4TOTAL, FREET4, T3FREE, THYROIDAB in the last 72 hours. Anemia Panel: No results for input(s): VITAMINB12, FOLATE, FERRITIN, TIBC, IRON, RETICCTPCT in the last 72 hours. Sepsis Labs: No results for input(s): PROCALCITON, LATICACIDVEN in the last 168 hours.  No results found for this or any previous visit (from the past 240 hour(s)).       Radiology Studies: Ct Coronary Morph W/cta Cor W/score W/ca W/cm &/or Wo/cm  Addendum Date: 07/31/2017   ADDENDUM REPORT: 07/31/2017 16:23 ADDENDUM: Please see separate dictation for contemporaneously obtained CTA chest, abdomen and pelvis 07/31/2017 for full description of relevant extracardiac findings. Electronically Signed   By: Vinnie Langton M.D.   On: 07/31/2017 16:23   Result Date: 07/31/2017 CLINICAL DATA:  Aortic Stenosis EXAM: Cardiac TAVR CT TECHNIQUE: The patient was scanned on a Siemens Force 379 slice scanner. A 120 kV retrospective scan was triggered in the ascending thoracic aorta at 140 HU's. Gantry rotation speed was 250 msecs and collimation was .6 mm. No beta blockade or nitro were given. The 3D data set was reconstructed in 5% intervals of the R-R cycle. Systolic and diastolic phases were analyzed on a dedicated work station using MPR, MIP and VRT modes. The patient received 80 cc of contrast. FINDINGS: Aortic Valve: Calcified tri-leaflet with restricted motion Aorta: Marked calcification of the arch and descending thoracic aorta Sino-tubular Junction: 31 mm Ascending Thoracic Aorta: 36 mm Aortic Arch: 30 mm Descending Thoracic Aorta: 29 mm Sinus of Valsalva Measurements:  Non-coronary: 34.5 mm Right - coronary: 32.9 mm Left -   coronary: 32.7 mm Coronary Artery Height above Annulus: Left Main: 12.7 mm above annulus Right Coronary: 15 mm above annulus Virtual Basal Annulus Measurements: Maximum / Minimum Diameter: 24.2 mm x 31 mm Perimeter: 89 mm Area: 613 mm2 Coronary Arteries: Sufficient height above annulus for deployment Optimum Fluoroscopic Angle for Delivery: LAO 13 degrees Caudal 15 degrees IMPRESSION: 1. Calcified Tri-leaflet AV with annular area of 613 mm2 suitable for a 29 mm Sapien 3 valve 2. Marked calcific atherosclerosis of the aortic arch and descending thoracic aorta 3. Optimum angiographic angle for deployment LAO 13 degrees Caudal 15 degrees 4.  Coronary arteries sufficient height above annulus for deployment Overall poor quality study for measurements due to poor opacification Jenkins Rouge Electronically Signed: By: Jenkins Rouge M.D. On: 07/31/2017 15:51   Ct Angio Chest Aorta W/cm &/or Wo/cm  Result Date: 07/31/2017 CLINICAL DATA:  47 year old male with history of severe aortic stenosis. Preprocedural study prior to potential transcatheter aortic valve replacement (  TAVR) procedure. EXAM: CT ANGIOGRAPHY CHEST, ABDOMEN AND PELVIS TECHNIQUE: Multidetector CT imaging through the chest, abdomen and pelvis was performed using the standard protocol during bolus administration of intravenous contrast. Multiplanar reconstructed images and MIPs were obtained and reviewed to evaluate the vascular anatomy. CONTRAST:  70m ISOVUE-370 IOPAMIDOL (ISOVUE-370) INJECTION 76% COMPARISON:  CT abdomen and pelvis 02/07/2017.  Chest CT 11/15/2015. FINDINGS: CTA CHEST FINDINGS Cardiovascular: Heart size is enlarged with left ventricular hypertrophy. There is no significant pericardial fluid, thickening or pericardial calcification. There is aortic atherosclerosis, as well as atherosclerosis of the great vessels of the mediastinum and the coronary arteries, including calcified  atherosclerotic plaque in the left main, left anterior descending, left circumflex and right coronary arteries. Severe calcifications of the aortic valve and mitral annulus right-sided pacemaker device with lead tips terminating in the right atrium and right ventricular apex. Mediastinum/Lymph Nodes: Multiple prominent borderline enlarged mediastinal and hilar lymph nodes are noted, but are nonspecific. Esophagus is unremarkable in appearance. No axillary lymphadenopathy. Lungs/Pleura: Mild diffuse ground-glass attenuation and mild interlobular septal thickening, suggestive of a background of mild interstitial pulmonary edema. Trace right pleural effusion. No left pleural effusion. No consolidative airspace disease. No suspicious appearing pulmonary nodules or masses. Musculoskeletal/Soft Tissues: There are no aggressive appearing lytic or blastic lesions noted in the visualized portions of the skeleton. CTA ABDOMEN AND PELVIS FINDINGS Hepatobiliary: Liver has a slightly shrunken appearance and nodular contour, suggesting underlying cirrhosis. No definite suspicious appearing cystic or solid hepatic lesions. No intra or extrahepatic biliary ductal dilatation. Status post cholecystectomy. Pancreas: No pancreatic mass. No pancreatic ductal dilatation. No pancreatic or peripancreatic fluid or inflammatory changes. Spleen: Unremarkable. Adrenals/Urinary Tract: 2.0 x 1.5 cm fatty attenuation lesion in the left adrenal gland, compatible with a small adrenal myelolipoma. Right adrenal gland is normal. Mild atrophy of both kidneys. No suspicious appearing renal lesions. No hydroureteronephrosis. Urinary bladder is unremarkable in appearance. Stomach/Bowel: Normal appearance of the stomach. No pathologic dilatation of small bowel or colon. Normal appendix. Vascular/Lymphatic: Aortic atherosclerosis, with vascular findings and measurements pertinent to potential TAVR procedure, as detailed below. 6 mm pseudoaneurysm of the  right common femoral artery (axial image 211 of series 6). Suboptimal contrast bolus on today's examination limits accurate assessment of other major abdominal vessels for potential hemodynamically significant stenosis, but there is considerable atherosclerotic disease in these vessels, particularly in the proximal celiac axis and superior mesenteric artery, both of which likely demonstrate at least moderate stenosis. No lymphadenopathy noted in the abdomen or pelvis. Reproductive: Prostate gland and seminal vesicles are unremarkable in appearance. Other: Trace volume of ascites.  No pneumoperitoneum. Musculoskeletal: There are no aggressive appearing lytic or blastic lesions noted in the visualized portions of the skeleton. VASCULAR MEASUREMENTS PERTINENT TO TAVR: AORTA: Minimal Aortic Diameter-10 x 13 mm Severity of Aortic Calcification-severe RIGHT PELVIS: Right Common Iliac Artery - Minimal Diameter-10.6 x 9.1 mm Tortuosity-mild Calcification-moderate to severe Right External Iliac Artery - Minimal Diameter-9.6 x 7.9 mm Tortuosity-mild Calcification-moderate Right Common Femoral Artery - Minimal Diameter-7.9 x 6.2 mm Tortuosity-mild Calcification-moderate LEFT PELVIS: Left Common Iliac Artery - Minimal Diameter-9.3 x 7.6 mm Tortuosity-mild Calcification-moderate to severe Left External Iliac Artery - Minimal Diameter-9.6 x 10.2 mm Tortuosity-mild Calcification-moderate Left Common Femoral Artery - Minimal Diameter-10.3 x 7.9 mm Tortuosity-mild Calcification-moderate Review of the MIP images confirms the above findings. IMPRESSION: 1. Vascular findings and measurements pertinent to potential TAVR procedure, as detailed above. 2. Severe thickening calcification of the aortic valve, compatible with the reported clinical history of  severe aortic stenosis. 3. 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 4. Cardiomegaly with left ventricular hypertrophy. 5. 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 6. Aortic  atherosclerosis, in addition to left main and 3 vessel coronary artery disease. Please note that although the presence of coronary artery calcium documents the presence of coronary artery disease, the severity of this disease and any potential stenosis cannot be assessed on this non-gated CT examination. Assessment for potential risk factor modification, dietary therapy or pharmacologic therapy may be warranted, if clinically indicated. 7. Additional incidental findings, as above. Aortic Atherosclerosis (ICD10-I70.0). Electronically Signed   By: Vinnie Langton M.D.   On: 07/31/2017 16:59   Ct Angio Abd/pel W/ And/or W/o  Result Date: 07/31/2017 CLINICAL DATA:  47 year old male with history of severe aortic stenosis. Preprocedural study prior to potential transcatheter aortic valve replacement (TAVR) procedure. EXAM: CT ANGIOGRAPHY CHEST, ABDOMEN AND PELVIS TECHNIQUE: Multidetector CT imaging through the chest, abdomen and pelvis was performed using the standard protocol during bolus administration of intravenous contrast. Multiplanar reconstructed images and MIPs were obtained and reviewed to evaluate the vascular anatomy. CONTRAST:  74m ISOVUE-370 IOPAMIDOL (ISOVUE-370) INJECTION 76% COMPARISON:  CT abdomen and pelvis 02/07/2017.  Chest CT 11/15/2015. FINDINGS: CTA CHEST FINDINGS Cardiovascular: Heart size is enlarged with left ventricular hypertrophy. There is no significant pericardial fluid, thickening or pericardial calcification. There is aortic atherosclerosis, as well as atherosclerosis of the great vessels of the mediastinum and the coronary arteries, including calcified atherosclerotic plaque in the left main, left anterior descending, left circumflex and right coronary arteries. Severe calcifications of the aortic valve and mitral annulus right-sided pacemaker device with lead tips terminating in the right atrium and right ventricular apex. Mediastinum/Lymph Nodes: Multiple prominent borderline  enlarged mediastinal and hilar lymph nodes are noted, but are nonspecific. Esophagus is unremarkable in appearance. No axillary lymphadenopathy. Lungs/Pleura: Mild diffuse ground-glass attenuation and mild interlobular septal thickening, suggestive of a background of mild interstitial pulmonary edema. Trace right pleural effusion. No left pleural effusion. No consolidative airspace disease. No suspicious appearing pulmonary nodules or masses. Musculoskeletal/Soft Tissues: There are no aggressive appearing lytic or blastic lesions noted in the visualized portions of the skeleton. CTA ABDOMEN AND PELVIS FINDINGS Hepatobiliary: Liver has a slightly shrunken appearance and nodular contour, suggesting underlying cirrhosis. No definite suspicious appearing cystic or solid hepatic lesions. No intra or extrahepatic biliary ductal dilatation. Status post cholecystectomy. Pancreas: No pancreatic mass. No pancreatic ductal dilatation. No pancreatic or peripancreatic fluid or inflammatory changes. Spleen: Unremarkable. Adrenals/Urinary Tract: 2.0 x 1.5 cm fatty attenuation lesion in the left adrenal gland, compatible with a small adrenal myelolipoma. Right adrenal gland is normal. Mild atrophy of both kidneys. No suspicious appearing renal lesions. No hydroureteronephrosis. Urinary bladder is unremarkable in appearance. Stomach/Bowel: Normal appearance of the stomach. No pathologic dilatation of small bowel or colon. Normal appendix. Vascular/Lymphatic: Aortic atherosclerosis, with vascular findings and measurements pertinent to potential TAVR procedure, as detailed below. 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 Suboptimal contrast bolus on today's examination limits accurate assessment of other major abdominal vessels for potential hemodynamically significant stenosis, but there is considerable atherosclerotic disease in these vessels, particularly in the proximal celiac axis and  superior mesenteric artery, both of which likely demonstrate at least moderate stenosis. No lymphadenopathy noted in the abdomen or pelvis. Reproductive: Prostate gland and seminal vesicles are unremarkable in appearance. Other: Trace volume of ascites.  No pneumoperitoneum. Musculoskeletal: There are no  aggressive appearing lytic or blastic lesions noted in the visualized portions of the skeleton. VASCULAR MEASUREMENTS PERTINENT TO TAVR: AORTA: Minimal Aortic Diameter-10 x 13 mm Severity of Aortic Calcification-severe RIGHT PELVIS: Right Common Iliac Artery - Minimal Diameter-10.6 x 9.1 mm Tortuosity-mild Calcification-moderate to severe Right External Iliac Artery - Minimal Diameter-9.6 x 7.9 mm Tortuosity-mild Calcification-moderate Right Common Femoral Artery - Minimal Diameter-7.9 x 6.2 mm Tortuosity-mild Calcification-moderate LEFT PELVIS: Left Common Iliac Artery - Minimal Diameter-9.3 x 7.6 mm Tortuosity-mild Calcification-moderate to severe Left External Iliac Artery - Minimal Diameter-9.6 x 10.2 mm Tortuosity-mild Calcification-moderate Left Common Femoral Artery - Minimal Diameter-10.3 x 7.9 mm Tortuosity-mild Calcification-moderate Review of the MIP images confirms the above findings. IMPRESSION: 1. Vascular findings and measurements pertinent to potential TAVR procedure, as detailed above. 2. Severe thickening calcification of the aortic valve, compatible with the reported clinical history of severe aortic stenosis. 3. 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 4. Cardiomegaly with left ventricular hypertrophy. 5. 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 6. Aortic atherosclerosis, in addition to left main and 3 vessel coronary artery disease. Please note that although the presence of coronary artery calcium documents the presence of coronary artery disease, the severity of this disease and any potential stenosis cannot be assessed on this non-gated CT examination. Assessment for potential  risk factor modification, dietary therapy or pharmacologic therapy may be warranted, if clinically indicated. 7. Additional incidental findings, as above. Aortic Atherosclerosis (ICD10-I70.0). Electronically Signed   By: Vinnie Langton M.D.   On: 07/31/2017 16:59        Scheduled Meds: . amLODipine  10 mg Oral Daily  . aspirin  81 mg Oral Daily  . atorvastatin  80 mg Oral q1800  . carvedilol  25 mg Oral BID WC  . cinacalcet  90 mg Oral Q breakfast  . cloNIDine  0.2 mg Transdermal Q Tue  . divalproex  500 mg Oral Q12H  . escitalopram  10 mg Oral Daily  . ferric citrate  630 mg Oral TID WC  . gabapentin  100 mg Oral BID  . hydrALAZINE  100 mg Oral Q8H  . irbesartan  300 mg Oral Daily  . isosorbide dinitrate  10 mg Oral BID  . multivitamin  1 tablet Oral QHS  . nicotine  21 mg Transdermal Daily  . sevelamer carbonate  2,400 mg Oral TID WC  . sodium chloride flush  3 mL Intravenous Q12H   Continuous Infusions: . sodium chloride    . sodium chloride    . ferric gluconate (FERRLECIT/NULECIT) IV 125 mg (08/01/17 0958)  . heparin 2,850 Units/hr (08/01/17 1458)  . sodium thiosulfate infusion for calciphylaxis       LOS: 12 days    Time spent: 35 minutes.     Elmarie Shiley, MD [name] Pager [phone]  If 7PM-7AM, please contact night-coverage [email or web address] Password [email or web address] 08/01/2017, 3:19 PM

## 2017-08-01 NOTE — Care Management Note (Addendum)
Case Management Note  Patient Details  Name: Hayden [name] MRN: [id number] Date of Birth: 07-10-1970  Subjective/Objective:   From home with wife and son, wife states she will be with him at home and then when she is not there her son will be there to assist him.  He presents with aortic and mitral stenosis, CAD, aflutter, hx of CVA, prior pacemaker, ISCM, ESRD, LE cellulitis, HTN, HLD, R AKA.   CVTS consult for surgery.  He uses a w/chair at home per wife.             Action/Plan: NCM will follow for dc needs.   Expected Discharge Date:  07/22/17               Expected Discharge Plan:  Home/Self Care  In-House Referral:     Discharge planning Services  CM Consult  Post Acute Care Choice:    Choice offered to:     DME Arranged:    DME Agency:     HH Arranged:    HH Agency:     Status of Service:  In process, will continue to follow  If discussed at Long Length of Stay Meetings, dates discussed:    Additional Comments:  Zenon Mayo, RN 08/01/2017, 10:06 AM

## 2017-08-01 NOTE — Progress Notes (Signed)
Progress Note  Patient Name: Hayden [name] Date of Encounter: 08/01/2017  Primary Cardiologist: Dr Ellyn Hack  Subjective   In dialysis; denies CP or dyspnea  Inpatient Medications    Scheduled Meds: . amLODipine  10 mg Oral Daily  . aspirin  81 mg Oral Daily  . atorvastatin  80 mg Oral q1800  . carvedilol  25 mg Oral BID WC  . cinacalcet  90 mg Oral Q breakfast  . cloNIDine  0.2 mg Transdermal Q Tue  . divalproex  500 mg Oral Q12H  . escitalopram  10 mg Oral Daily  . ferric citrate  630 mg Oral TID WC  . gabapentin  100 mg Oral BID  . hydrALAZINE  100 mg Oral Q8H  . irbesartan  300 mg Oral Daily  . isosorbide dinitrate  10 mg Oral BID  . multivitamin  1 tablet Oral QHS  . nicotine  21 mg Transdermal Daily  . sevelamer carbonate  2,400 mg Oral TID WC  . sodium chloride flush  3 mL Intravenous Q12H   Continuous Infusions: . sodium chloride    . sodium chloride    . ferric gluconate (FERRLECIT/NULECIT) IV    . heparin 2,900 Units/hr (08/01/17 0000)  . sodium thiosulfate infusion for calciphylaxis     PRN Meds: sodium chloride, sodium chloride, alteplase, camphor-menthol, heparin, heparin, heparin, lidocaine (PF), lidocaine-prilocaine, nitroGLYCERIN, oxyCODONE, pentafluoroprop-tetrafluoroeth, senna-docusate, sodium chloride flush   Vital Signs    Vitals:   07/31/17 1100 07/31/17 1609 07/31/17 1906 08/01/17 0437  BP: (!) 156/74 126/85 (!) 158/83   Pulse: 73 70 62 66  Resp: 12 13 18 17   Temp:  97.7 F (36.5 C) 98.4 F (36.9 C) 98.1 F (36.7 C)  TempSrc:  Oral Oral Oral  SpO2:  94% 95%   Weight:    220 lb 14.4 oz (100.2 kg)  Height:        Intake/Output Summary (Last 24 hours) at 08/01/2017 0806 Last data filed at 08/01/2017 0000 Gross per 24 hour  Intake 510.67 ml  Output -  Net 510.67 ml   Filed Weights   07/30/17 1155 07/31/17 0416 08/01/17 0437  Weight: 213 lb 13.5 oz (97 kg) 220 lb 14.4 oz (100.2 kg) 220 lb 14.4 oz (100.2 kg)    Physical Exam    GEN: WD, chronically ill appearing obese NAD Neck: supple Cardiac: RRR, 3/6 systolic murmur; no DM Respiratory: CTA GI: Soft, NT/ND MS: s/p right AKA; erythematous lesion LLE; no change compared to yesterday Neuro:  no focal findings   Labs    Chemistry Recent Labs  Lab 07/30/17 0558 07/31/17 0431 08/01/17 0407  NA 136 138 134*  K 4.2 3.9 4.5  CL 96* 98* 95*  CO2 22 26 24   GLUCOSE 110* 81 81  BUN 44* 26* 37*  CREATININE 7.22* 5.32* 6.86*  CALCIUM 8.4* 8.5* 8.8*  ALBUMIN 2.4* 2.4* 2.5*  GFRNONAA 8* 12* 9*  GFRAA 9* 14* 10*  ANIONGAP 18* 14 15     Hematology Recent Labs  Lab 07/30/17 0558 07/31/17 0431 08/01/17 0407  WBC 11.0* 10.3 10.7*  RBC 4.00* 3.92* 3.93*  HGB 11.1* 10.8* 10.7*  HCT 35.3* 34.7* 34.9*  MCV 88.3 88.5 88.8  MCH 27.8 27.6 27.2  MCHC 31.4 31.1 30.7  RDW 18.7* 18.7* 18.8*  PLT 346 341 373      Patient Profile     47 year old male with past medical history of diabetes mellitus, prior CVA, seizures, prior partial nephrectomy for kidney  cancer, end-stage renal disease, prior right AKA, hypertension, hyperlipidemia, bipolar disorder, history of atrial flutter, prior pacemaker with severe aortic stenosis and cardiomyopathy.  Admitted with lower extremity cellulitis.  Had decreased mental status transiently and MRI showed multiple small infarcts.  Echocardiogram was performed and showed ejection fraction 35-40% with akinesis of the apical myocardium, severe aortic stenosis with mean gradient 60 mmHg, severe mitral annular calcification with severe mitral stenosis with with mean gradient 12 mmHg, moderate left atrial enlargement and moderate right ventricular enlargement. Cardiac cath performed and medical therapy planned.    Assessment & Plan    1. Aortic stenosis/mitral stenosis-Echocardiogram shows newly reduced LV function with wall motion abnormality (EF 35-40).  Cath results noted. Plan medical therapy for CAD. Continue ASA and statin. Given  multiple comorbidities (end-stage renal disease dialysis dependent, prior CVA, prior right AKA, recent cellulitis left lower extremity) unclear if he is a candidate for AVR/MVR/CABG or TAVR. CTA results noted; Dr Cyndia Bent to review today. If he is not a candidate for either procedure, would need to consider hospice. 2. CAD-plan medical therapy; treat with ASA and statin. 3. History of atrial flutter-given CVA he will require long-term anticoagulation.  Continue heparin until all procedures complete.  Continue carvedilol for rate control if atrial flutter recurs. 4. Prior pacemaker 5. Probable ischemic cardiomyopathy-continue beta-blocker and ARB. 6. End-stage renal disease dialysis dependent-being dialyzed today. 7. Possible lower extremity cellulitis-Skin biopsy results c/w calciphylaxis. Per IM. 8. Hypertension-continue present blood pressure medications. 9. Hyperlipidemia-continue statin at present dose.     For questions or updates, please contact [place] Please consult [email or web address] for contact info under Cardiology/STEMI.      Signed, Kirk Ruths, MD  08/01/2017, 8:06 AM

## 2017-08-02 LAB — RENAL FUNCTION PANEL
ANION GAP: 18 — AB (ref 5–15)
Albumin: 2.4 g/dL — ABNORMAL LOW (ref 3.5–5.0)
BUN: 22 mg/dL — ABNORMAL HIGH (ref 6–20)
CALCIUM: 8.7 mg/dL — AB (ref 8.9–10.3)
CHLORIDE: 95 mmol/L — AB (ref 101–111)
CO2: 25 mmol/L (ref 22–32)
Creatinine, Ser: 5.04 mg/dL — ABNORMAL HIGH (ref 0.61–1.24)
GFR calc Af Amer: 15 mL/min — ABNORMAL LOW (ref >60)
GFR calc non Af Amer: 13 mL/min — ABNORMAL LOW (ref >60)
GLUCOSE: 99 mg/dL (ref 65–99)
Phosphorus: 4 mg/dL (ref 2.5–4.6)
Potassium: 4 mmol/L (ref 3.5–5.1)
SODIUM: 138 mmol/L (ref 135–145)

## 2017-08-02 LAB — CBC
HCT: 35.1 % — ABNORMAL LOW (ref 39.0–52.0)
Hemoglobin: 11.1 g/dL — ABNORMAL LOW (ref 13.0–17.0)
MCH: 27.6 pg (ref 26.0–34.0)
MCHC: 31.6 g/dL (ref 30.0–36.0)
MCV: 87.3 fL (ref 78.0–100.0)
PLATELETS: 344 10*3/uL (ref 150–400)
RBC: 4.02 MIL/uL — ABNORMAL LOW (ref 4.22–5.81)
RDW: 18.9 % — AB (ref 11.5–15.5)
WBC: 10.5 10*3/uL (ref 4.0–10.5)

## 2017-08-02 LAB — HEPARIN LEVEL (UNFRACTIONATED): Heparin Unfractionated: 0.45 IU/mL (ref 0.30–0.70)

## 2017-08-02 LAB — GLUCOSE, CAPILLARY: GLUCOSE-CAPILLARY: 98 mg/dL (ref 65–99)

## 2017-08-02 LAB — PROTIME-INR
INR: 1.31
Prothrombin Time: 16.2 seconds — ABNORMAL HIGH (ref 11.4–15.2)

## 2017-08-02 LAB — MRSA PCR SCREENING: MRSA by PCR: NEGATIVE

## 2017-08-02 MED ORDER — HYDROXYZINE HCL 25 MG PO TABS: 25.0000 mg | ORAL_TABLET | Freq: Every day | ORAL | Status: DC | PRN

## 2017-08-02 MED ORDER — SEVELAMER CARBONATE 2.4 G PO PACK: 2.4000 g | PACK | Freq: Two times a day (BID) | ORAL | Status: DC | PRN

## 2017-08-02 MED ORDER — HYDROXYZINE HCL 25 MG PO TABS
25.0000 mg | ORAL_TABLET | Freq: Once | ORAL | Status: AC
  Administered 2017-08-02: 25 mg via ORAL
  Filled 2017-08-02: qty 1

## 2017-08-02 MED ORDER — WARFARIN - PHARMACIST DOSING INPATIENT
Freq: Every day | Status: DC
  Administered 2017-08-03: 18:00:00

## 2017-08-02 MED ORDER — WARFARIN SODIUM 5 MG PO TABS
5.0000 mg | ORAL_TABLET | Freq: Once | ORAL | Status: AC
  Administered 2017-08-02: 5 mg via ORAL
  Filled 2017-08-02: qty 1

## 2017-08-02 NOTE — Progress Notes (Signed)
Progress Note  Patient Name: Hayden [name] Date of Encounter: 08/02/2017  Primary Cardiologist:   No primary care provider on file.   Subjective   He denies SOB.  Refused dialysis today.    Inpatient Medications    Scheduled Meds: . amLODipine  10 mg Oral Daily  . aspirin  81 mg Oral Daily  . atorvastatin  80 mg Oral q1800  . carvedilol  25 mg Oral BID WC  . cinacalcet  90 mg Oral Q breakfast  . cloNIDine  0.2 mg Transdermal Q Tue  . divalproex  500 mg Oral Q12H  . escitalopram  10 mg Oral Daily  . ferric citrate  630 mg Oral TID WC  . gabapentin  100 mg Oral BID  . hydrALAZINE  100 mg Oral Q8H  . irbesartan  300 mg Oral Daily  . isosorbide dinitrate  10 mg Oral BID  . multivitamin  1 tablet Oral QHS  . nicotine  21 mg Transdermal Daily  . sevelamer carbonate  2,400 mg Oral TID WC  . sodium chloride flush  3 mL Intravenous Q12H   Continuous Infusions: . sodium chloride    . sodium chloride    . ferric gluconate (FERRLECIT/NULECIT) IV 125 mg (08/01/17 0958)  . heparin 2,850 Units/hr (08/02/17 0845)  . sodium thiosulfate infusion for calciphylaxis     PRN Meds: sodium chloride, sodium chloride, camphor-menthol, heparin, heparin, heparin, hydrOXYzine, nitroGLYCERIN, oxyCODONE, senna-docusate, sevelamer carbonate, sodium chloride flush   Vital Signs    Vitals:   08/01/17 2145 08/01/17 2344 08/02/17 0501 08/02/17 0845  BP:  (!) 147/70 (!) 170/77 135/76  Pulse:  69 73 70  Resp:  18 16 20   Temp:  98.2 F (36.8 C) 98 F (36.7 C) 98.5 F (36.9 C)  TempSrc:  Oral Oral Oral  SpO2: 93% 95% 93% 93%  Weight:  215 lb 13.3 oz (97.9 kg)    Height:  5\' 10"  (1.778 m)      Intake/Output Summary (Last 24 hours) at 08/02/2017 1313 Last data filed at 08/02/2017 4431 Gross per 24 hour  Intake 772.79 ml  Output 0 ml  Net 772.79 ml   Filed Weights   08/01/17 0755 08/01/17 1203 08/01/17 2344  Weight: 220 lb 7.4 oz (100 kg) 213 lb 3 oz (96.7 kg) 215 lb 13.3 oz (97.9 kg)     Telemetry    NSR - Personally Reviewed  ECG    NA - Personally Reviewed  Physical Exam   GEN: No acute distress.   Neck: No  JVD Cardiac: RRR, 3/6 apical systolic murmurs, rubs, or gallops.  Respiratory: Clear  to auscultation bilaterally. GI: Soft, nontender, non-distended  MS: No edema; No deformity.  Chronic left leg ulcer Neuro:  Nonfocal  Psych: Normal affect   Labs    Chemistry Recent Labs  Lab 07/31/17 0431 08/01/17 0407 08/02/17 0628  NA 138 134* 138  K 3.9 4.5 4.0  CL 98* 95* 95*  CO2 26 24 25   GLUCOSE 81 81 99  BUN 26* 37* 22*  CREATININE 5.32* 6.86* 5.04*  CALCIUM 8.5* 8.8* 8.7*  ALBUMIN 2.4* 2.5* 2.4*  GFRNONAA 12* 9* 13*  GFRAA 14* 10* 15*  ANIONGAP 14 15 18*     Hematology Recent Labs  Lab 07/31/17 0431 08/01/17 0407 08/02/17 0628  WBC 10.3 10.7* 10.5  RBC 3.92* 3.93* 4.02*  HGB 10.8* 10.7* 11.1*  HCT 34.7* 34.9* 35.1*  MCV 88.5 88.8 87.3  MCH 27.6 27.2 27.6  MCHC 31.1 30.7 31.6  RDW 18.7* 18.8* 18.9*  PLT 341 373 344    Cardiac EnzymesNo results for input(s): TROPONINI in the last 168 hours. No results for input(s): TROPIPOC in the last 168 hours.   BNPNo results for input(s): BNP, PROBNP in the last 168 hours.   DDimer No results for input(s): DDIMER in the last 168 hours.   Radiology    Ct Coronary Morph W/cta Cor W/score W/ca W/cm &/or Wo/cm  Addendum Date: 07/31/2017   ADDENDUM REPORT: 07/31/2017 16:23 ADDENDUM: Please see separate dictation for contemporaneously obtained CTA chest, abdomen and pelvis 07/31/2017 for full description of relevant extracardiac findings. Electronically Signed   By: Vinnie Langton M.D.   On: 07/31/2017 16:23   Result Date: 07/31/2017 CLINICAL DATA:  Aortic Stenosis EXAM: Cardiac TAVR CT TECHNIQUE: The patient was scanned on a Siemens Force 563 slice scanner. A 120 kV retrospective scan was triggered in the ascending thoracic aorta at 140 HU's. Gantry rotation speed was 250 msecs and  collimation was .6 mm. No beta blockade or nitro were given. The 3D data set was reconstructed in 5% intervals of the R-R cycle. Systolic and diastolic phases were analyzed on a dedicated work station using MPR, MIP and VRT modes. The patient received 80 cc of contrast. FINDINGS: Aortic Valve: Calcified tri-leaflet with restricted motion Aorta: Marked calcification of the arch and descending thoracic aorta Sino-tubular Junction: 31 mm Ascending Thoracic Aorta: 36 mm Aortic Arch: 30 mm Descending Thoracic Aorta: 29 mm Sinus of Valsalva Measurements: Non-coronary: 34.5 mm Right - coronary: 32.9 mm Left -   coronary: 32.7 mm Coronary Artery Height above Annulus: Left Main: 12.7 mm above annulus Right Coronary: 15 mm above annulus Virtual Basal Annulus Measurements: Maximum / Minimum Diameter: 24.2 mm x 31 mm Perimeter: 89 mm Area: 613 mm2 Coronary Arteries: Sufficient height above annulus for deployment Optimum Fluoroscopic Angle for Delivery: LAO 13 degrees Caudal 15 degrees IMPRESSION: 1. Calcified Tri-leaflet AV with annular area of 613 mm2 suitable for a 29 mm Sapien 3 valve 2. Marked calcific atherosclerosis of the aortic arch and descending thoracic aorta 3. Optimum angiographic angle for deployment LAO 13 degrees Caudal 15 degrees 4.  Coronary arteries sufficient height above annulus for deployment Overall poor quality study for measurements due to poor opacification Jenkins Rouge Electronically Signed: By: Jenkins Rouge M.D. On: 07/31/2017 15:51   Ct Angio Chest Aorta W/cm &/or Wo/cm  Result Date: 07/31/2017 CLINICAL DATA:  47 year old male with history of severe aortic stenosis. Preprocedural study prior to potential transcatheter aortic valve replacement (TAVR) procedure. EXAM: CT ANGIOGRAPHY CHEST, ABDOMEN AND PELVIS TECHNIQUE: Multidetector CT imaging through the chest, abdomen and pelvis was performed using the standard protocol during bolus administration of intravenous contrast. Multiplanar  reconstructed images and MIPs were obtained and reviewed to evaluate the vascular anatomy. CONTRAST:  74mL ISOVUE-370 IOPAMIDOL (ISOVUE-370) INJECTION 76% COMPARISON:  CT abdomen and pelvis 02/07/2017.  Chest CT 11/15/2015. FINDINGS: CTA CHEST FINDINGS Cardiovascular: Heart size is enlarged with left ventricular hypertrophy. There is no significant pericardial fluid, thickening or pericardial calcification. There is aortic atherosclerosis, as well as atherosclerosis of the great vessels of the mediastinum and the coronary arteries, including calcified atherosclerotic plaque in the left main, left anterior descending, left circumflex and right coronary arteries. Severe calcifications of the aortic valve and mitral annulus right-sided pacemaker device with lead tips terminating in the right atrium and right ventricular apex. Mediastinum/Lymph Nodes: Multiple prominent borderline enlarged mediastinal and hilar lymph nodes are  noted, but are nonspecific. Esophagus is unremarkable in appearance. No axillary lymphadenopathy. Lungs/Pleura: Mild diffuse ground-glass attenuation and mild interlobular septal thickening, suggestive of a background of mild interstitial pulmonary edema. Trace right pleural effusion. No left pleural effusion. No consolidative airspace disease. No suspicious appearing pulmonary nodules or masses. Musculoskeletal/Soft Tissues: There are no aggressive appearing lytic or blastic lesions noted in the visualized portions of the skeleton. CTA ABDOMEN AND PELVIS FINDINGS Hepatobiliary: Liver has a slightly shrunken appearance and nodular contour, suggesting underlying cirrhosis. No definite suspicious appearing cystic or solid hepatic lesions. No intra or extrahepatic biliary ductal dilatation. Status post cholecystectomy. Pancreas: No pancreatic mass. No pancreatic ductal dilatation. No pancreatic or peripancreatic fluid or inflammatory changes. Spleen: Unremarkable. Adrenals/Urinary Tract: 2.0 x 1.5 cm  fatty attenuation lesion in the left adrenal gland, compatible with a small adrenal myelolipoma. Right adrenal gland is normal. Mild atrophy of both kidneys. No suspicious appearing renal lesions. No hydroureteronephrosis. Urinary bladder is unremarkable in appearance. Stomach/Bowel: Normal appearance of the stomach. No pathologic dilatation of small bowel or colon. Normal appendix. Vascular/Lymphatic: Aortic atherosclerosis, with vascular findings and measurements pertinent to potential TAVR procedure, as detailed below. 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 Suboptimal contrast bolus on today's examination limits accurate assessment of other major abdominal vessels for potential hemodynamically significant stenosis, but there is considerable atherosclerotic disease in these vessels, particularly in the proximal celiac axis and superior mesenteric artery, both of which likely demonstrate at least moderate stenosis. No lymphadenopathy noted in the abdomen or pelvis. Reproductive: Prostate gland and seminal vesicles are unremarkable in appearance. Other: Trace volume of ascites.  No pneumoperitoneum. Musculoskeletal: There are no aggressive appearing lytic or blastic lesions noted in the visualized portions of the skeleton. VASCULAR MEASUREMENTS PERTINENT TO TAVR: AORTA: Minimal Aortic Diameter-10 x 13 mm Severity of Aortic Calcification-severe RIGHT PELVIS: Right Common Iliac Artery - Minimal Diameter-10.6 x 9.1 mm Tortuosity-mild Calcification-moderate to severe Right External Iliac Artery - Minimal Diameter-9.6 x 7.9 mm Tortuosity-mild Calcification-moderate Right Common Femoral Artery - Minimal Diameter-7.9 x 6.2 mm Tortuosity-mild Calcification-moderate LEFT PELVIS: Left Common Iliac Artery - Minimal Diameter-9.3 x 7.6 mm Tortuosity-mild Calcification-moderate to severe Left External Iliac Artery - Minimal Diameter-9.6 x 10.2 mm Tortuosity-mild  Calcification-moderate Left Common Femoral Artery - Minimal Diameter-10.3 x 7.9 mm Tortuosity-mild Calcification-moderate Review of the MIP images confirms the above findings. IMPRESSION: 1. Vascular findings and measurements pertinent to potential TAVR procedure, as detailed above. 2. Severe thickening calcification of the aortic valve, compatible with the reported clinical history of severe aortic stenosis. 3. 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 4. Cardiomegaly with left ventricular hypertrophy. 5. 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 6. Aortic atherosclerosis, in addition to left main and 3 vessel coronary artery disease. Please note that although the presence of coronary artery calcium documents the presence of coronary artery disease, the severity of this disease and any potential stenosis cannot be assessed on this non-gated CT examination. Assessment for potential risk factor modification, dietary therapy or pharmacologic therapy may be warranted, if clinically indicated. 7. Additional incidental findings, as above. Aortic Atherosclerosis (ICD10-I70.0). Electronically Signed   By: Vinnie Langton M.D.   On: 07/31/2017 16:59   Ct Angio Abd/pel W/ And/or W/o  Result Date: 07/31/2017 CLINICAL DATA:  47 year old male with history of severe aortic stenosis. Preprocedural study prior to potential transcatheter aortic valve replacement (TAVR) procedure. EXAM: CT ANGIOGRAPHY CHEST, ABDOMEN AND PELVIS TECHNIQUE: Multidetector CT  imaging through the chest, abdomen and pelvis was performed using the standard protocol during bolus administration of intravenous contrast. Multiplanar reconstructed images and MIPs were obtained and reviewed to evaluate the vascular anatomy. CONTRAST:  35mL ISOVUE-370 IOPAMIDOL (ISOVUE-370) INJECTION 76% COMPARISON:  CT abdomen and pelvis 02/07/2017.  Chest CT 11/15/2015. FINDINGS: CTA CHEST FINDINGS Cardiovascular: Heart size is enlarged with left ventricular  hypertrophy. There is no significant pericardial fluid, thickening or pericardial calcification. There is aortic atherosclerosis, as well as atherosclerosis of the great vessels of the mediastinum and the coronary arteries, including calcified atherosclerotic plaque in the left main, left anterior descending, left circumflex and right coronary arteries. Severe calcifications of the aortic valve and mitral annulus right-sided pacemaker device with lead tips terminating in the right atrium and right ventricular apex. Mediastinum/Lymph Nodes: Multiple prominent borderline enlarged mediastinal and hilar lymph nodes are noted, but are nonspecific. Esophagus is unremarkable in appearance. No axillary lymphadenopathy. Lungs/Pleura: Mild diffuse ground-glass attenuation and mild interlobular septal thickening, suggestive of a background of mild interstitial pulmonary edema. Trace right pleural effusion. No left pleural effusion. No consolidative airspace disease. No suspicious appearing pulmonary nodules or masses. Musculoskeletal/Soft Tissues: There are no aggressive appearing lytic or blastic lesions noted in the visualized portions of the skeleton. CTA ABDOMEN AND PELVIS FINDINGS Hepatobiliary: Liver has a slightly shrunken appearance and nodular contour, suggesting underlying cirrhosis. No definite suspicious appearing cystic or solid hepatic lesions. No intra or extrahepatic biliary ductal dilatation. Status post cholecystectomy. Pancreas: No pancreatic mass. No pancreatic ductal dilatation. No pancreatic or peripancreatic fluid or inflammatory changes. Spleen: Unremarkable. Adrenals/Urinary Tract: 2.0 x 1.5 cm fatty attenuation lesion in the left adrenal gland, compatible with a small adrenal myelolipoma. Right adrenal gland is normal. Mild atrophy of both kidneys. No suspicious appearing renal lesions. No hydroureteronephrosis. Urinary bladder is unremarkable in appearance. Stomach/Bowel: Normal appearance of the  stomach. No pathologic dilatation of small bowel or colon. Normal appendix. Vascular/Lymphatic: Aortic atherosclerosis, with vascular findings and measurements pertinent to potential TAVR procedure, as detailed below. 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 Suboptimal contrast bolus on today's examination limits accurate assessment of other major abdominal vessels for potential hemodynamically significant stenosis, but there is considerable atherosclerotic disease in these vessels, particularly in the proximal celiac axis and superior mesenteric artery, both of which likely demonstrate at least moderate stenosis. No lymphadenopathy noted in the abdomen or pelvis. Reproductive: Prostate gland and seminal vesicles are unremarkable in appearance. Other: Trace volume of ascites.  No pneumoperitoneum. Musculoskeletal: There are no aggressive appearing lytic or blastic lesions noted in the visualized portions of the skeleton. VASCULAR MEASUREMENTS PERTINENT TO TAVR: AORTA: Minimal Aortic Diameter-10 x 13 mm Severity of Aortic Calcification-severe RIGHT PELVIS: Right Common Iliac Artery - Minimal Diameter-10.6 x 9.1 mm Tortuosity-mild Calcification-moderate to severe Right External Iliac Artery - Minimal Diameter-9.6 x 7.9 mm Tortuosity-mild Calcification-moderate Right Common Femoral Artery - Minimal Diameter-7.9 x 6.2 mm Tortuosity-mild Calcification-moderate LEFT PELVIS: Left Common Iliac Artery - Minimal Diameter-9.3 x 7.6 mm Tortuosity-mild Calcification-moderate to severe Left External Iliac Artery - Minimal Diameter-9.6 x 10.2 mm Tortuosity-mild Calcification-moderate Left Common Femoral Artery - Minimal Diameter-10.3 x 7.9 mm Tortuosity-mild Calcification-moderate Review of the MIP images confirms the above findings. IMPRESSION: 1. Vascular findings and measurements pertinent to potential TAVR procedure, as detailed above. 2. Severe thickening calcification of the  aortic valve, compatible with the reported clinical history of severe aortic stenosis. 3. 6 mm pseudoaneurysm in the right common  femoral artery. 4. Cardiomegaly with left ventricular hypertrophy. 5. 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 6. Aortic atherosclerosis, in addition to left main and 3 vessel coronary artery disease. Please note that although the presence of coronary artery calcium documents the presence of coronary artery disease, the severity of this disease and any potential stenosis cannot be assessed on this non-gated CT examination. Assessment for potential risk factor modification, dietary therapy or pharmacologic therapy may be warranted, if clinically indicated. 7. Additional incidental findings, as above. Aortic Atherosclerosis (ICD10-I70.0). Electronically Signed   By: Vinnie Langton M.D.   On: 07/31/2017 16:59    Cardiac Studies   Cardiac cath 07/30/17  Conclusion     RV marginal branch is 99% stenosed. this is a small branch.  Mid RCA lesion is 70% stenosed.  Post Atrio lesion is 95% stenosed. This is a small vessel.  Ost 1st Diag lesion is 90% stenosed.  Mid LAD lesion is 50% stenosed.  Dist LAD lesion is 90% stenosed.  Ost Cx to Prox Cx lesion is 50% stenosed.  The left ventricular systolic function is normal.  LV end diastolic pressure is mildly elevated.  The left ventricular ejection fraction is 55-65% by visual estimate.  There is moderate to severe Aortic stenosis. Peak gradient 69 mm Hg, mean gradient 42 mm Hg. valve area 1.2 cm squared with index .55  There is moderate mitral valve stenosis. MV mean gradient of 10 mm Hg. valve area 1.98 cm squared. index 0.9.  Hemodynamic findings consistent with moderate pulmonary hypertension.   1. 2 vessel obstructive CAD. Heavily calcified vessels with predominantly small vessel disease. 2. Moderate to severe Aortic stenosis.  3. Moderate mitral stenosis. 4. Moderate pulmonary HTN. 5. Normal cardiac  output.  6. Normal LV function     Patient Profile     47 y.o. male with past medical history of diabetes mellitus, prior CVA, seizures, prior partial nephrectomy for kidney cancer, end-stage renal disease, prior right AKA, hypertension, hyperlipidemia, bipolar disorder, history of atrial flutter, prior pacemaker with severe aortic stenosis and cardiomyopathy.  Admitted with lower extremity cellulitis.  Had decreased mental status transiently and MRI showed multiple small infarcts.  Echocardiogram was performed and showed ejection fraction 35-40% with akinesis of the apical myocardium,severe aortic stenosis with mean gradient 60 mmHg, severe mitral annular calcification with severe mitral stenosis with with mean gradient 12 mmHg, moderate left atrial enlargement and moderate right ventricular enlargement. Cardiac cath performed and medical therapy planned.  Was evaluated by Dr. Cyndia Bent and not thought to be a TAVR candidate     Assessment & Plan    Aortic stenosis/mitral stenosis-Echocardiogram shows newly reduced LV function with wall motion abnormality (EF 35-40). Cath results noted. Plan medical therapy for CAD. Not a TAVR or SAVR candidate.  Continue volume management per dialysis.  Consider palliative consult.  CAD-plan medical therapy; treat with ASA and statin.  History of atrial flutter-given CVA he will require long-term anticoagulation. Restart warfarin per primary team if there are no other invasive procedures planned.    Prior pacemaker:  He has been up to date with pacemaker follow up.  He was seen in Nov by the EP service.   Probable ischemic cardiomyopathy-continue beta-blocker and ARB.  We will sign off.  Please call with other questions.   For questions or updates, please contact [place] Please consult [email or web address] for contact info under Cardiology/STEMI.   Signed, Minus Breeding, MD  08/02/2017, 1:13 PM

## 2017-08-02 NOTE — Progress Notes (Signed)
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 XR showed calcified vessels without evidence of emphysema or fracture. Vancomycin and zosyn were given, nephrology consulted for HD while inpatient, and he was admitted. Fistulogram performed 1/22 showed widely patent AVF circuit. The patient grew more lethargic following the procedure. Neurology was consulted, sedating medications held. EEG showed nonspecific slowing. MRI showed multiple small supra- and infratentorial diffusion abnormalities most consistent with infarcts spanning multiple vascular territories. Work up of stroke included echocardiogram which showed severe aortic and mitral stenosis as well as new wall motion abnormalities. Cardiology was consulted and CT surgery evaluation is pending to decide on further actions.    Assessment & Plan:   Active Problems:   History of partial Right nephrectomy for renal mass (2008)   End stage renal disease on dialysis [facility])   History of CVA (cerebrovascular accident)   Atherosclerosis of native arteries of the extremities with ulceration ([place])   Above knee amputation of right lower extremity ([facility])   ESRD on hemodialysis ([place])   Complete heart block ([place]) - s/p PPM   Atrial flutter ([place]); CHA2DS2Vasc -5, on warfarin   Hyperlipidemia with target low density lipoprotein (LDL) cholesterol less than 70 mg/dL   Cellulitis and abscess of left lower extremity   Severe aortic stenosis   Calciphylaxis   Assessment & Plan: Cellulitis complicating  calciphylaxis of left lower extremity: He has risk factors including h/o DM, PVD w/hx right AKA. U/S negative for DVT. Blood cultures negative.  - Erythema worsening/stable despite many days of broad spectrum abx.  -this is all related to calciphylaxis.  -  biopsy results from 1/25, showed VASCULITIS WITH CALCIFICATION MOST CONSISTENT WITH CALCIPHYLAXIS. - Holding coumadin, continue heparin, holding Ca. Phos binder per nephrology. Pt not adherent.  - Pain control,  with oxycodone, decreased dose with lethargy. -ID consulted. They evaluated patient on 1-28 recommend to stop antibiotics. Diagnosis consistent with calciphylaxis.  -started on renvela and thiosulfate. Appreciate Dr Lorrene Reid help.  -ok to resume coumadin, when appropriated, discussed with Dr Lorrene Reid. Will resume coumadin.   CVA: Seen on motion-degraded MRI. Multiple vascular distributions involved consistent with cardioembolic/septic emboli. Echo showed no thrombus. Carotid U/S limited by pt movement showed 1-39% stenosis of bilateral ICA's. 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  - Stroke team signed off, no new recommendations at this time.  - Continue statin - IV heparin, will require ongoing anticoagulation.   Acute systolic CHF with severe aortic stenosis, mitral stenosis: Echo with newly depressed LVEF to 35-40% from 60-65% with akinesis of mid-apicalanteroseptal myocardium. No wall motion abnormalities on last echo in Aug 2018. Also showed severe aortic stenosis, mitral stenosis. Pt has no chest pain. -cath , 2 vessels CAD. Medical management  -CT angio, coronary amenable for TAVAR  -evaluated by Dr Cyndia Bent, patient is too high risk for open heart surgery, no candidate for TAVR at this time due to concerns with his LE issues. Recommend to follow course of LE calciphylaxis over few weeks , months.,  -Dr Percival Spanish recommend palliative care consult.   ESRD on hemodialysiswith mild  hyperkalemia and mild anemia - Nephrology providing HD on  schedule.   Acute metabolic encephalopathy: Resolving. With waxing/waning course, suspect delirium worsened by sedating medications and CVA. EEG 1/23 nonspecific slowing without epileptiform discharges.  - Ammonia mildly elevated, gave lactulose, though had significant diarrhea and mental status improved. Holding for now.  - Hold sedating medications as able.  -oxycodone dose decreased.   Above knee amputation of right lower extremity - PT evaluation, eventual dispo to CIR anticipated.  Complete heart block: s/p PPM placement Nov 2018.  - No signs pacemaker is malfunctioning.   Atrial flutter: CHA2DS2-VASc Scoreis 5 - continue coreg. - Heparin per pharmacy, stopped coumadin  Hyperlipidemia: - Continue statin.   Bipolar/depression:  - Continue depakote and lexapro     DVT prophylaxis: IV heparin.  Code Status: full code.  Family Communication: care discussed with patient and wife Disposition Plan: to be determine. CIR vs SNF  Consultants:   Nephrology  IR  Neurology  General surgery  Cardiology  Cardiothoracic surgery  ID       Procedures:   HD Echocardiogram 07/24/2017: Akinesis of the distal anteroseptal wall and apex with overall moderate LV dysfunction; severe LVH; heavily calcified aortic valve with severe AS (mean gradient 60 mmHg); severe MAC with severe MS (mean gradient 12 mmHg); moderate LAE; moderate RVE; mild TR.     Antimicrobials:    Subjective: Per wife, patient sty up until late night. He cant sleep. He sleeps during the day.  He is sleepy,   answer questions.   Objective: Vitals:   08/01/17 2145 08/01/17 2344 08/02/17 0501 08/02/17 0845  BP:  (!) 147/70 (!) 170/77 135/76  Pulse:  69 73 70  Resp:  18 16 20   Temp:  98.2 F (36.8 C) 98 F (36.7 C) 98.5 F (36.9 C)  TempSrc:  Oral Oral Oral  SpO2: 93% 95% 93% 93%  Weight:  97.9 kg (215 lb 13.3 oz)    Height:  5' 10"  (1.778 m)      Intake/Output  Summary (Last 24 hours) at 08/02/2017 1437 Last data filed at 08/02/2017 4034 Gross per 24 hour  Intake 772.79 ml  Output 0 ml  Net 772.79 ml   Filed Weights   08/01/17 0755 08/01/17 1203 08/01/17 2344  Weight: 100 kg (220 lb 7.4 oz) 96.7 kg (213 lb 3 oz) 97.9 kg (215 lb 13.3 oz)    Examination:  General exam: NAD, Sleepy  Respiratory system: CTA Cardiovascular system: S 1, S 2 RRR Gastrointestinal system: BS present, soft, nt Central nervous system: sleepy, follows some command.  Extremities: Right AKA, left LE with erythema and superficial necrosis.     Data Reviewed: I have personally reviewed following labs and imaging studies  CBC: Recent Labs  Lab 07/29/17 1244 07/30/17 0558 07/31/17 0431 08/01/17 0407 08/02/17 0628  WBC 11.5* 11.0* 10.3 10.7* 10.5  HGB 11.2* 11.1* 10.8* 10.7* 11.1*  HCT 35.7* 35.3* 34.7* 34.9* 35.1*  MCV 88.1 88.3 88.5 88.8 87.3  PLT 314 346 341 373 742   Basic Metabolic Panel: Recent Labs  Lab 07/29/17 1244 07/30/17 0558 07/31/17 0431 08/01/17 0407 08/02/17 0628  NA 134* 136 138 134* 138  K 4.2 4.2 3.9 4.5 4.0  CL 94* 96* 98* 95* 95*  CO2 24 22 26 24 25   GLUCOSE 104* 110* 81 81 99  BUN 34* 44* 26* 37* 22*  CREATININE 6.40* 7.22* 5.32* 6.86* 5.04*  CALCIUM 8.4* 8.4* 8.5* 8.8* 8.7*  PHOS 5.3* 5.9* 5.5* 6.8*  4.0   GFR: Estimated Creatinine Clearance: 21.7 mL/min (A) (by C-G formula based on SCr of 5.04 mg/dL (H)). Liver Function Tests: Recent Labs  Lab 07/29/17 1244 07/30/17 0558 07/31/17 0431 08/01/17 0407 08/02/17 0628  ALBUMIN 2.5* 2.4* 2.4* 2.5* 2.4*   No results for input(s): LIPASE, AMYLASE in the last 168 hours. No results for input(s): AMMONIA in the last 168 hours. Coagulation Profile: Recent Labs  Lab 07/29/17 1244 07/30/17 0558 07/31/17 0431 08/01/17 0407 08/02/17 0628  INR 1.31 1.33 1.36 1.33 1.31   Cardiac Enzymes: No results for input(s): CKTOTAL, CKMB, CKMBINDEX, TROPONINI in the last 168 hours. BNP  (last 3 results) No results for input(s): PROBNP in the last 8760 hours. HbA1C: No results for input(s): HGBA1C in the last 72 hours. CBG: Recent Labs  Lab 07/31/17 1144 08/01/17 0651 08/01/17 0928 08/01/17 2216 08/02/17 0736  GLUCAP 94 89 88 102* 98   Lipid Profile: No results for input(s): CHOL, HDL, LDLCALC, TRIG, CHOLHDL, LDLDIRECT in the last 72 hours. Thyroid Function Tests: No results for input(s): TSH, T4TOTAL, FREET4, T3FREE, THYROIDAB in the last 72 hours. Anemia Panel: No results for input(s): VITAMINB12, FOLATE, FERRITIN, TIBC, IRON, RETICCTPCT in the last 72 hours. Sepsis Labs: No results for input(s): PROCALCITON, LATICACIDVEN in the last 168 hours.  Recent Results (from the past 240 hour(s))  MRSA PCR Screening     Status: None   Collection Time: 08/01/17 11:53 PM  Result Value Ref Range Status   MRSA by PCR NEGATIVE NEGATIVE Final    Comment:        The GeneXpert MRSA Assay (FDA approved for NASAL specimens only), is one component of a comprehensive MRSA colonization surveillance program. It is not intended to diagnose MRSA infection nor to guide or monitor treatment for MRSA infections. Performed at [facility], [place] [address] [place], [place] [address]          Radiology Studies: Ct Coronary Morph W/cta Cor W/score W/ca W/cm &/or Wo/cm  Addendum Date: 07/31/2017   ADDENDUM REPORT: 07/31/2017 16:23 ADDENDUM: Please see separate dictation for contemporaneously obtained CTA chest, abdomen and pelvis 07/31/2017 for full description of relevant extracardiac findings. Electronically Signed   By: Vinnie Langton M.D.   On: 07/31/2017 16:23   Result Date: 07/31/2017 CLINICAL DATA:  Aortic Stenosis EXAM: Cardiac TAVR CT TECHNIQUE: The patient was scanned on a Siemens Force 664 slice scanner. A 120 kV retrospective scan was triggered in the ascending thoracic aorta at 140 HU's. Gantry rotation speed was 250 msecs and collimation was .6 mm. No beta  blockade or nitro were given. The 3D data set was reconstructed in 5% intervals of the R-R cycle. Systolic and diastolic phases were analyzed on a dedicated work station using MPR, MIP and VRT modes. The patient received 80 cc of contrast. FINDINGS: Aortic Valve: Calcified tri-leaflet with restricted motion Aorta: Marked calcification of the arch and descending thoracic aorta Sino-tubular Junction: 31 mm Ascending Thoracic Aorta: 36 mm Aortic Arch: 30 mm Descending Thoracic Aorta: 29 mm Sinus of Valsalva Measurements: Non-coronary: 34.5 mm Right - coronary: 32.9 mm Left -   coronary: 32.7 mm Coronary Artery Height above Annulus: Left Main: 12.7 mm above annulus Right Coronary: 15 mm above annulus Virtual Basal Annulus Measurements: Maximum / Minimum Diameter: 24.2 mm x 31 mm Perimeter: 89 mm Area: 613 mm2 Coronary Arteries: Sufficient height above annulus for deployment Optimum Fluoroscopic Angle for Delivery: LAO 13 degrees Caudal 15 degrees IMPRESSION: 1. Calcified Tri-leaflet AV with  annular area of 613 mm2 suitable for a 29 mm Sapien 3 valve 2. Marked calcific atherosclerosis of the aortic arch and descending thoracic aorta 3. Optimum angiographic angle for deployment LAO 13 degrees Caudal 15 degrees 4.  Coronary arteries sufficient height above annulus for deployment Overall poor quality study for measurements due to poor opacification Jenkins Rouge Electronically Signed: By: Jenkins Rouge M.D. On: 07/31/2017 15:51   Ct Angio Chest Aorta W/cm &/or Wo/cm  Result Date: 07/31/2017 CLINICAL DATA:  47 year old male with history of severe aortic stenosis. Preprocedural study prior to potential transcatheter aortic valve replacement (TAVR) procedure. EXAM: CT ANGIOGRAPHY CHEST, ABDOMEN AND PELVIS TECHNIQUE: Multidetector CT imaging through the chest, abdomen and pelvis was performed using the standard protocol during bolus administration of intravenous contrast. Multiplanar reconstructed images and MIPs were  obtained and reviewed to evaluate the vascular anatomy. CONTRAST:  41m ISOVUE-370 IOPAMIDOL (ISOVUE-370) INJECTION 76% COMPARISON:  CT abdomen and pelvis 02/07/2017.  Chest CT 11/15/2015. FINDINGS: CTA CHEST FINDINGS Cardiovascular: Heart size is enlarged with left ventricular hypertrophy. There is no significant pericardial fluid, thickening or pericardial calcification. There is aortic atherosclerosis, as well as atherosclerosis of the great vessels of the mediastinum and the coronary arteries, including calcified atherosclerotic plaque in the left main, left anterior descending, left circumflex and right coronary arteries. Severe calcifications of the aortic valve and mitral annulus right-sided pacemaker device with lead tips terminating in the right atrium and right ventricular apex. Mediastinum/Lymph Nodes: Multiple prominent borderline enlarged mediastinal and hilar lymph nodes are noted, but are nonspecific. Esophagus is unremarkable in appearance. No axillary lymphadenopathy. Lungs/Pleura: Mild diffuse ground-glass attenuation and mild interlobular septal thickening, suggestive of a background of mild interstitial pulmonary edema. Trace right pleural effusion. No left pleural effusion. No consolidative airspace disease. No suspicious appearing pulmonary nodules or masses. Musculoskeletal/Soft Tissues: There are no aggressive appearing lytic or blastic lesions noted in the visualized portions of the skeleton. CTA ABDOMEN AND PELVIS FINDINGS Hepatobiliary: Liver has a slightly shrunken appearance and nodular contour, suggesting underlying cirrhosis. No definite suspicious appearing cystic or solid hepatic lesions. No intra or extrahepatic biliary ductal dilatation. Status post cholecystectomy. Pancreas: No pancreatic mass. No pancreatic ductal dilatation. No pancreatic or peripancreatic fluid or inflammatory changes. Spleen: Unremarkable. Adrenals/Urinary Tract: 2.0 x 1.5 cm fatty attenuation lesion in the  left adrenal gland, compatible with a small adrenal myelolipoma. Right adrenal gland is normal. Mild atrophy of both kidneys. No suspicious appearing renal lesions. No hydroureteronephrosis. Urinary bladder is unremarkable in appearance. Stomach/Bowel: Normal appearance of the stomach. No pathologic dilatation of small bowel or colon. Normal appendix. Vascular/Lymphatic: Aortic atherosclerosis, with vascular findings and measurements pertinent to potential TAVR procedure, as detailed below. 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 Suboptimal contrast bolus on today's examination limits accurate assessment of other major abdominal vessels for potential hemodynamically significant stenosis, but there is considerable atherosclerotic disease in these vessels, particularly in the proximal celiac axis and superior mesenteric artery, both of which likely demonstrate at least moderate stenosis. No lymphadenopathy noted in the abdomen or pelvis. Reproductive: Prostate gland and seminal vesicles are unremarkable in appearance. Other: Trace volume of ascites.  No pneumoperitoneum. Musculoskeletal: There are no aggressive appearing lytic or blastic lesions noted in the visualized portions of the skeleton. VASCULAR MEASUREMENTS PERTINENT TO TAVR: AORTA: Minimal Aortic Diameter-10 x 13 mm Severity of Aortic Calcification-severe RIGHT PELVIS: Right Common Iliac Artery - Minimal Diameter-10.6 x 9.1 mm Tortuosity-mild Calcification-moderate to severe  Right External Iliac Artery - Minimal Diameter-9.6 x 7.9 mm Tortuosity-mild Calcification-moderate Right Common Femoral Artery - Minimal Diameter-7.9 x 6.2 mm Tortuosity-mild Calcification-moderate LEFT PELVIS: Left Common Iliac Artery - Minimal Diameter-9.3 x 7.6 mm Tortuosity-mild Calcification-moderate to severe Left External Iliac Artery - Minimal Diameter-9.6 x 10.2 mm Tortuosity-mild Calcification-moderate Left Common Femoral Artery -  Minimal Diameter-10.3 x 7.9 mm Tortuosity-mild Calcification-moderate Review of the MIP images confirms the above findings. IMPRESSION: 1. Vascular findings and measurements pertinent to potential TAVR procedure, as detailed above. 2. Severe thickening calcification of the aortic valve, compatible with the reported clinical history of severe aortic stenosis. 3. 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 4. Cardiomegaly with left ventricular hypertrophy. 5. 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 6. Aortic atherosclerosis, in addition to left main and 3 vessel coronary artery disease. Please note that although the presence of coronary artery calcium documents the presence of coronary artery disease, the severity of this disease and any potential stenosis cannot be assessed on this non-gated CT examination. Assessment for potential risk factor modification, dietary therapy or pharmacologic therapy may be warranted, if clinically indicated. 7. Additional incidental findings, as above. Aortic Atherosclerosis (ICD10-I70.0). Electronically Signed   By: Vinnie Langton M.D.   On: 07/31/2017 16:59   Ct Angio Abd/pel W/ And/or W/o  Result Date: 07/31/2017 CLINICAL DATA:  47 year old male with history of severe aortic stenosis. Preprocedural study prior to potential transcatheter aortic valve replacement (TAVR) procedure. EXAM: CT ANGIOGRAPHY CHEST, ABDOMEN AND PELVIS TECHNIQUE: Multidetector CT imaging through the chest, abdomen and pelvis was performed using the standard protocol during bolus administration of intravenous contrast. Multiplanar reconstructed images and MIPs were obtained and reviewed to evaluate the vascular anatomy. CONTRAST:  22m ISOVUE-370 IOPAMIDOL (ISOVUE-370) INJECTION 76% COMPARISON:  CT abdomen and pelvis 02/07/2017.  Chest CT 11/15/2015. FINDINGS: CTA CHEST FINDINGS Cardiovascular: Heart size is enlarged with left ventricular hypertrophy. There is no significant pericardial fluid,  thickening or pericardial calcification. There is aortic atherosclerosis, as well as atherosclerosis of the great vessels of the mediastinum and the coronary arteries, including calcified atherosclerotic plaque in the left main, left anterior descending, left circumflex and right coronary arteries. Severe calcifications of the aortic valve and mitral annulus right-sided pacemaker device with lead tips terminating in the right atrium and right ventricular apex. Mediastinum/Lymph Nodes: Multiple prominent borderline enlarged mediastinal and hilar lymph nodes are noted, but are nonspecific. Esophagus is unremarkable in appearance. No axillary lymphadenopathy. Lungs/Pleura: Mild diffuse ground-glass attenuation and mild interlobular septal thickening, suggestive of a background of mild interstitial pulmonary edema. Trace right pleural effusion. No left pleural effusion. No consolidative airspace disease. No suspicious appearing pulmonary nodules or masses. Musculoskeletal/Soft Tissues: There are no aggressive appearing lytic or blastic lesions noted in the visualized portions of the skeleton. CTA ABDOMEN AND PELVIS FINDINGS Hepatobiliary: Liver has a slightly shrunken appearance and nodular contour, suggesting underlying cirrhosis. No definite suspicious appearing cystic or solid hepatic lesions. No intra or extrahepatic biliary ductal dilatation. Status post cholecystectomy. Pancreas: No pancreatic mass. No pancreatic ductal dilatation. No pancreatic or peripancreatic fluid or inflammatory changes. Spleen: Unremarkable. Adrenals/Urinary Tract: 2.0 x 1.5 cm fatty attenuation lesion in the left adrenal gland, compatible with a small adrenal myelolipoma. Right adrenal gland is normal. Mild atrophy of both kidneys. No suspicious appearing renal lesions. No hydroureteronephrosis. Urinary bladder is unremarkable in appearance. Stomach/Bowel: Normal appearance of the stomach. No pathologic dilatation of small bowel or colon.  Normal appendix. Vascular/Lymphatic: Aortic atherosclerosis, with vascular findings  and measurements pertinent to potential TAVR procedure, as detailed below. 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 Suboptimal contrast bolus on today's examination limits accurate assessment of other major abdominal vessels for potential hemodynamically significant stenosis, but there is considerable atherosclerotic disease in these vessels, particularly in the proximal celiac axis and superior mesenteric artery, both of which likely demonstrate at least moderate stenosis. No lymphadenopathy noted in the abdomen or pelvis. Reproductive: Prostate gland and seminal vesicles are unremarkable in appearance. Other: Trace volume of ascites.  No pneumoperitoneum. Musculoskeletal: There are no aggressive appearing lytic or blastic lesions noted in the visualized portions of the skeleton. VASCULAR MEASUREMENTS PERTINENT TO TAVR: AORTA: Minimal Aortic Diameter-10 x 13 mm Severity of Aortic Calcification-severe RIGHT PELVIS: Right Common Iliac Artery - Minimal Diameter-10.6 x 9.1 mm Tortuosity-mild Calcification-moderate to severe Right External Iliac Artery - Minimal Diameter-9.6 x 7.9 mm Tortuosity-mild Calcification-moderate Right Common Femoral Artery - Minimal Diameter-7.9 x 6.2 mm Tortuosity-mild Calcification-moderate LEFT PELVIS: Left Common Iliac Artery - Minimal Diameter-9.3 x 7.6 mm Tortuosity-mild Calcification-moderate to severe Left External Iliac Artery - Minimal Diameter-9.6 x 10.2 mm Tortuosity-mild Calcification-moderate Left Common Femoral Artery - Minimal Diameter-10.3 x 7.9 mm Tortuosity-mild Calcification-moderate Review of the MIP images confirms the above findings. IMPRESSION: 1. Vascular findings and measurements pertinent to potential TAVR procedure, as detailed above. 2. Severe thickening calcification of the aortic valve, compatible with the reported clinical history of  severe aortic stenosis. 3. 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 4. Cardiomegaly with left ventricular hypertrophy. 5. 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 6. Aortic atherosclerosis, in addition to left main and 3 vessel coronary artery disease. Please note that although the presence of coronary artery calcium documents the presence of coronary artery disease, the severity of this disease and any potential stenosis cannot be assessed on this non-gated CT examination. Assessment for potential risk factor modification, dietary therapy or pharmacologic therapy may be warranted, if clinically indicated. 7. Additional incidental findings, as above. Aortic Atherosclerosis (ICD10-I70.0). Electronically Signed   By: Vinnie Langton M.D.   On: 07/31/2017 16:59        Scheduled Meds: . amLODipine  10 mg Oral Daily  . aspirin  81 mg Oral Daily  . atorvastatin  80 mg Oral q1800  . carvedilol  25 mg Oral BID WC  . cinacalcet  90 mg Oral Q breakfast  . cloNIDine  0.2 mg Transdermal Q Tue  . divalproex  500 mg Oral Q12H  . escitalopram  10 mg Oral Daily  . ferric citrate  630 mg Oral TID WC  . gabapentin  100 mg Oral BID  . hydrALAZINE  100 mg Oral Q8H  . irbesartan  300 mg Oral Daily  . isosorbide dinitrate  10 mg Oral BID  . multivitamin  1 tablet Oral QHS  . nicotine  21 mg Transdermal Daily  . sevelamer carbonate  2,400 mg Oral TID WC  . sodium chloride flush  3 mL Intravenous Q12H   Continuous Infusions: . sodium chloride    . sodium chloride    . ferric gluconate (FERRLECIT/NULECIT) IV 125 mg (08/01/17 0958)  . heparin 2,850 Units/hr (08/02/17 0845)  . sodium thiosulfate infusion for calciphylaxis       LOS: 13 days    Time spent: 35 minutes.     Elmarie Shiley, MD [name] Pager [phone]  If 7PM-7AM, please contact night-coverage [email or web address] Password [facility] 08/02/2017, 2:37 PM

## 2017-08-02 NOTE — Progress Notes (Signed)
Patient said he doesn't want to go today for an extra HD session, notified Dr. Dicie Beam.  She said he said the same thing in the morning on her rounds but was trying to see if he will change his mind till the time comes. HD said that he will be on 2nd round.  NOtified the same thing to the doctor and asked if his medications can be given and she said go ahead and give and see if he changes his mind.

## 2017-08-02 NOTE — Plan of Care (Signed)
  Pain Managment: General experience of comfort will improve 08/02/2017 2329 - Progressing by Irish Lack, RN   Clinical Measurements: Ability to maintain clinical measurements within normal limits will improve 08/02/2017 2329 - Progressing by Irish Lack, RN

## 2017-08-02 NOTE — Progress Notes (Signed)
[facility] Progress Note   Subjective:   Sleeping but easily awoken. No complaints today. Denies pain, n/v/d.   Objective Vitals:   08/01/17 2025 08/01/17 2145 08/01/17 2344 08/02/17 0501  BP: (!) 141/68  (!) 147/70 (!) 170/77  Pulse:   69 73  Resp: 19  18 16   Temp: 99.1 F (37.3 C)  98.2 F (36.8 C) 98 F (36.7 C)  TempSrc: Oral  Oral Oral  SpO2: 91% 93% 95% 93%  Weight:   97.9 kg (215 lb 13.3 oz)   Height:   5\' 10"  (1.778 m)    Physical Exam General:NAD, chronically ill appearing obese male.  Heart:RRR, +3/7 systolic murmur, heard throughout pericardium, +JVD Lungs:CTAB anterior/laterally, BS diminished, nml WOB Abdomen:soft, NTND, obese, +BS Extremities:RLE: AKA, LLE: large area of eschar/necrosis w/surrounding erythema on medial calf - no edema Dialysis Access: LU AVF +b/t   Filed Weights   08/01/17 0755 08/01/17 1203 08/01/17 2344  Weight: 100 kg (220 lb 7.4 oz) 96.7 kg (213 lb 3 oz) 97.9 kg (215 lb 13.3 oz)    Intake/Output Summary (Last 24 hours) at 08/02/2017 0745 Last data filed at 08/02/2017 9024 Gross per 24 hour  Intake 1393.79 ml  Output 3500 ml  Net -2106.21 ml    Recent Labs  Lab 07/31/17 0431 08/01/17 0407 08/02/17 0628  NA 138 134* 138  K 3.9 4.5 4.0  CL 98* 95* 95*  CO2 26 24 25   GLUCOSE 81 81 99  BUN 26* 37* 22*  CREATININE 5.32* 6.86* 5.04*  CALCIUM 8.5* 8.8* 8.7*  PHOS 5.5* 6.8* 4.0    Recent Labs  Lab 07/30/17 0558 07/31/17 0431 08/01/17 0407  ALBUMIN 2.4* 2.4* 2.5*    Recent Labs  Lab 07/29/17 1244 07/30/17 0558 07/31/17 0431 08/01/17 0407 08/02/17 0628  WBC 11.5* 11.0* 10.3 10.7* 10.5  HGB 11.2* 11.1* 10.8* 10.7* 11.1*  HCT 35.7* 35.3* 34.7* 34.9* 35.1*  MCV 88.1 88.3 88.5 88.8 87.3  PLT 314 346 341 373 344   CBG: Recent Labs  Lab 07/31/17 1144 08/01/17 0651 08/01/17 0928 08/01/17 2216 08/02/17 0736  GLUCAP 94 89 88 102* 98    Lab Results  Component Value Date   INR 1.31 08/02/2017   INR  1.33 08/01/2017   INR 1.36 07/31/2017   Studies/Results: Ct Coronary Morph W/cta Cor W/score W/ca W/cm &/or Wo/cm  Addendum Date: 07/31/2017   ADDENDUM REPORT: 07/31/2017 16:23 ADDENDUM: Please see separate dictation for contemporaneously obtained CTA chest, abdomen and pelvis 07/31/2017 for full description of relevant extracardiac findings. Electronically Signed   By: Vinnie Langton M.D.   On: 07/31/2017 16:23   Result Date: 07/31/2017 CLINICAL DATA:  Aortic Stenosis EXAM: Cardiac TAVR CT TECHNIQUE: The patient was scanned on a Siemens Force 097 slice scanner. A 120 kV retrospective scan was triggered in the ascending thoracic aorta at 140 HU's. Gantry rotation speed was 250 msecs and collimation was .6 mm. No beta blockade or nitro were given. The 3D data set was reconstructed in 5% intervals of the R-R cycle. Systolic and diastolic phases were analyzed on a dedicated work station using MPR, MIP and VRT modes. The patient received 80 cc of contrast. FINDINGS: Aortic Valve: Calcified tri-leaflet with restricted motion Aorta: Marked calcification of the arch and descending thoracic aorta Sino-tubular Junction: 31 mm Ascending Thoracic Aorta: 36 mm Aortic Arch: 30 mm Descending Thoracic Aorta: 29 mm Sinus of Valsalva Measurements: Non-coronary: 34.5 mm Right - coronary: 32.9 mm Left -   coronary:  32.7 mm Coronary Artery Height above Annulus: Left Main: 12.7 mm above annulus Right Coronary: 15 mm above annulus Virtual Basal Annulus Measurements: Maximum / Minimum Diameter: 24.2 mm x 31 mm Perimeter: 89 mm Area: 613 mm2 Coronary Arteries: Sufficient height above annulus for deployment Optimum Fluoroscopic Angle for Delivery: LAO 13 degrees Caudal 15 degrees IMPRESSION: 1. Calcified Tri-leaflet AV with annular area of 613 mm2 suitable for a 29 mm Sapien 3 valve 2. Marked calcific atherosclerosis of the aortic arch and descending thoracic aorta 3. Optimum angiographic angle for deployment LAO 13 degrees Caudal  15 degrees 4.  Coronary arteries sufficient height above annulus for deployment Overall poor quality study for measurements due to poor opacification Jenkins Rouge Electronically Signed: By: Jenkins Rouge M.D. On: 07/31/2017 15:51   Ct Angio Chest Aorta W/cm &/or Wo/cm  Result Date: 07/31/2017 CLINICAL DATA:  47 year old male with history of severe aortic stenosis. Preprocedural study prior to potential transcatheter aortic valve replacement (TAVR) procedure. EXAM: CT ANGIOGRAPHY CHEST, ABDOMEN AND PELVIS TECHNIQUE: Multidetector CT imaging through the chest, abdomen and pelvis was performed using the standard protocol during bolus administration of intravenous contrast. Multiplanar reconstructed images and MIPs were obtained and reviewed to evaluate the vascular anatomy. CONTRAST:  75mL ISOVUE-370 IOPAMIDOL (ISOVUE-370) INJECTION 76% COMPARISON:  CT abdomen and pelvis 02/07/2017.  Chest CT 11/15/2015. FINDINGS: CTA CHEST FINDINGS Cardiovascular: Heart size is enlarged with left ventricular hypertrophy. There is no significant pericardial fluid, thickening or pericardial calcification. There is aortic atherosclerosis, as well as atherosclerosis of the great vessels of the mediastinum and the coronary arteries, including calcified atherosclerotic plaque in the left main, left anterior descending, left circumflex and right coronary arteries. Severe calcifications of the aortic valve and mitral annulus right-sided pacemaker device with lead tips terminating in the right atrium and right ventricular apex. Mediastinum/Lymph Nodes: Multiple prominent borderline enlarged mediastinal and hilar lymph nodes are noted, but are nonspecific. Esophagus is unremarkable in appearance. No axillary lymphadenopathy. Lungs/Pleura: Mild diffuse ground-glass attenuation and mild interlobular septal thickening, suggestive of a background of mild interstitial pulmonary edema. Trace right pleural effusion. No left pleural effusion. No  consolidative airspace disease. No suspicious appearing pulmonary nodules or masses. Musculoskeletal/Soft Tissues: There are no aggressive appearing lytic or blastic lesions noted in the visualized portions of the skeleton. CTA ABDOMEN AND PELVIS FINDINGS Hepatobiliary: Liver has a slightly shrunken appearance and nodular contour, suggesting underlying cirrhosis. No definite suspicious appearing cystic or solid hepatic lesions. No intra or extrahepatic biliary ductal dilatation. Status post cholecystectomy. Pancreas: No pancreatic mass. No pancreatic ductal dilatation. No pancreatic or peripancreatic fluid or inflammatory changes. Spleen: Unremarkable. Adrenals/Urinary Tract: 2.0 x 1.5 cm fatty attenuation lesion in the left adrenal gland, compatible with a small adrenal myelolipoma. Right adrenal gland is normal. Mild atrophy of both kidneys. No suspicious appearing renal lesions. No hydroureteronephrosis. Urinary bladder is unremarkable in appearance. Stomach/Bowel: Normal appearance of the stomach. No pathologic dilatation of small bowel or colon. Normal appendix. Vascular/Lymphatic: Aortic atherosclerosis, with vascular findings and measurements pertinent to potential TAVR procedure, as detailed below. 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 Suboptimal contrast bolus on today's examination limits accurate assessment of other major abdominal vessels for potential hemodynamically significant stenosis, but there is considerable atherosclerotic disease in these vessels, particularly in the proximal celiac axis and superior mesenteric artery, both of which likely demonstrate at least moderate stenosis. No lymphadenopathy noted in the abdomen or pelvis. Reproductive: Prostate gland and seminal  vesicles are unremarkable in appearance. Other: Trace volume of ascites.  No pneumoperitoneum. Musculoskeletal: There are no aggressive appearing lytic or blastic lesions noted in  the visualized portions of the skeleton. VASCULAR MEASUREMENTS PERTINENT TO TAVR: AORTA: Minimal Aortic Diameter-10 x 13 mm Severity of Aortic Calcification-severe RIGHT PELVIS: Right Common Iliac Artery - Minimal Diameter-10.6 x 9.1 mm Tortuosity-mild Calcification-moderate to severe Right External Iliac Artery - Minimal Diameter-9.6 x 7.9 mm Tortuosity-mild Calcification-moderate Right Common Femoral Artery - Minimal Diameter-7.9 x 6.2 mm Tortuosity-mild Calcification-moderate LEFT PELVIS: Left Common Iliac Artery - Minimal Diameter-9.3 x 7.6 mm Tortuosity-mild Calcification-moderate to severe Left External Iliac Artery - Minimal Diameter-9.6 x 10.2 mm Tortuosity-mild Calcification-moderate Left Common Femoral Artery - Minimal Diameter-10.3 x 7.9 mm Tortuosity-mild Calcification-moderate Review of the MIP images confirms the above findings. IMPRESSION: 1. Vascular findings and measurements pertinent to potential TAVR procedure, as detailed above. 2. Severe thickening calcification of the aortic valve, compatible with the reported clinical history of severe aortic stenosis. 3. 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 4. Cardiomegaly with left ventricular hypertrophy. 5. 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 6. Aortic atherosclerosis, in addition to left main and 3 vessel coronary artery disease. Please note that although the presence of coronary artery calcium documents the presence of coronary artery disease, the severity of this disease and any potential stenosis cannot be assessed on this non-gated CT examination. Assessment for potential risk factor modification, dietary therapy or pharmacologic therapy may be warranted, if clinically indicated. 7. Additional incidental findings, as above. Aortic Atherosclerosis (ICD10-I70.0). Electronically Signed   By: Vinnie Langton M.D.   On: 07/31/2017 16:59   Ct Angio Abd/pel W/ And/or W/o  Result Date: 07/31/2017 CLINICAL DATA:  47 year old male  with history of severe aortic stenosis. Preprocedural study prior to potential transcatheter aortic valve replacement (TAVR) procedure. EXAM: CT ANGIOGRAPHY CHEST, ABDOMEN AND PELVIS TECHNIQUE: Multidetector CT imaging through the chest, abdomen and pelvis was performed using the standard protocol during bolus administration of intravenous contrast. Multiplanar reconstructed images and MIPs were obtained and reviewed to evaluate the vascular anatomy. CONTRAST:  43mL ISOVUE-370 IOPAMIDOL (ISOVUE-370) INJECTION 76% COMPARISON:  CT abdomen and pelvis 02/07/2017.  Chest CT 11/15/2015. FINDINGS: CTA CHEST FINDINGS Cardiovascular: Heart size is enlarged with left ventricular hypertrophy. There is no significant pericardial fluid, thickening or pericardial calcification. There is aortic atherosclerosis, as well as atherosclerosis of the great vessels of the mediastinum and the coronary arteries, including calcified atherosclerotic plaque in the left main, left anterior descending, left circumflex and right coronary arteries. Severe calcifications of the aortic valve and mitral annulus right-sided pacemaker device with lead tips terminating in the right atrium and right ventricular apex. Mediastinum/Lymph Nodes: Multiple prominent borderline enlarged mediastinal and hilar lymph nodes are noted, but are nonspecific. Esophagus is unremarkable in appearance. No axillary lymphadenopathy. Lungs/Pleura: Mild diffuse ground-glass attenuation and mild interlobular septal thickening, suggestive of a background of mild interstitial pulmonary edema. Trace right pleural effusion. No left pleural effusion. No consolidative airspace disease. No suspicious appearing pulmonary nodules or masses. Musculoskeletal/Soft Tissues: There are no aggressive appearing lytic or blastic lesions noted in the visualized portions of the skeleton. CTA ABDOMEN AND PELVIS FINDINGS Hepatobiliary: Liver has a slightly shrunken appearance and nodular contour,  suggesting underlying cirrhosis. No definite suspicious appearing cystic or solid hepatic lesions. No intra or extrahepatic biliary ductal dilatation. Status post cholecystectomy. Pancreas: No pancreatic mass. No pancreatic ductal dilatation. No pancreatic or peripancreatic fluid or inflammatory changes. Spleen: Unremarkable. Adrenals/Urinary Tract:  2.0 x 1.5 cm fatty attenuation lesion in the left adrenal gland, compatible with a small adrenal myelolipoma. Right adrenal gland is normal. Mild atrophy of both kidneys. No suspicious appearing renal lesions. No hydroureteronephrosis. Urinary bladder is unremarkable in appearance. Stomach/Bowel: Normal appearance of the stomach. No pathologic dilatation of small bowel or colon. Normal appendix. Vascular/Lymphatic: Aortic atherosclerosis, with vascular findings and measurements pertinent to potential TAVR procedure, as detailed below. 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 Suboptimal contrast bolus on today's examination limits accurate assessment of other major abdominal vessels for potential hemodynamically significant stenosis, but there is considerable atherosclerotic disease in these vessels, particularly in the proximal celiac axis and superior mesenteric artery, both of which likely demonstrate at least moderate stenosis. No lymphadenopathy noted in the abdomen or pelvis. Reproductive: Prostate gland and seminal vesicles are unremarkable in appearance. Other: Trace volume of ascites.  No pneumoperitoneum. Musculoskeletal: There are no aggressive appearing lytic or blastic lesions noted in the visualized portions of the skeleton. VASCULAR MEASUREMENTS PERTINENT TO TAVR: AORTA: Minimal Aortic Diameter-10 x 13 mm Severity of Aortic Calcification-severe RIGHT PELVIS: Right Common Iliac Artery - Minimal Diameter-10.6 x 9.1 mm Tortuosity-mild Calcification-moderate to severe Right External Iliac Artery - Minimal  Diameter-9.6 x 7.9 mm Tortuosity-mild Calcification-moderate Right Common Femoral Artery - Minimal Diameter-7.9 x 6.2 mm Tortuosity-mild Calcification-moderate LEFT PELVIS: Left Common Iliac Artery - Minimal Diameter-9.3 x 7.6 mm Tortuosity-mild Calcification-moderate to severe Left External Iliac Artery - Minimal Diameter-9.6 x 10.2 mm Tortuosity-mild Calcification-moderate Left Common Femoral Artery - Minimal Diameter-10.3 x 7.9 mm Tortuosity-mild Calcification-moderate Review of the MIP images confirms the above findings. IMPRESSION: 1. Vascular findings and measurements pertinent to potential TAVR procedure, as detailed above. 2. Severe thickening calcification of the aortic valve, compatible with the reported clinical history of severe aortic stenosis. 3. 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 4. Cardiomegaly with left ventricular hypertrophy. 5. 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 6. Aortic atherosclerosis, in addition to left main and 3 vessel coronary artery disease. Please note that although the presence of coronary artery calcium documents the presence of coronary artery disease, the severity of this disease and any potential stenosis cannot be assessed on this non-gated CT examination. Assessment for potential risk factor modification, dietary therapy or pharmacologic therapy may be warranted, if clinically indicated. 7. Additional incidental findings, as above. Aortic Atherosclerosis (ICD10-I70.0). Electronically Signed   By: Vinnie Langton M.D.   On: 07/31/2017 16:59    Medications: . sodium chloride    . sodium chloride    . ferric gluconate (FERRLECIT/NULECIT) IV 125 mg (08/01/17 0958)  . heparin 2,850 Units/hr (08/01/17 2329)  . sodium thiosulfate infusion for calciphylaxis     . amLODipine  10 mg Oral Daily  . aspirin  81 mg Oral Daily  . atorvastatin  80 mg Oral q1800  . carvedilol  25 mg Oral BID WC  . cinacalcet  90 mg Oral Q breakfast  . cloNIDine  0.2 mg  Transdermal Q Tue  . divalproex  500 mg Oral Q12H  . escitalopram  10 mg Oral Daily  . ferric citrate  630 mg Oral TID WC  . gabapentin  100 mg Oral BID  . hydrALAZINE  100 mg Oral Q8H  . irbesartan  300 mg Oral Daily  . isosorbide dinitrate  10 mg Oral BID  . multivitamin  1 tablet Oral QHS  . nicotine  21 mg Transdermal Daily  .  sevelamer carbonate  2,400 mg Oral TID WC  . 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 Na thiosulfate started 1/31 w/HD. VERY poor P control/nonadherence to binders as outpt. On sensipar dose ^90. Now on Renvela powder and Auryxia.  P improved to 4 today best I have ever seen for this pt. Cor Ca 10. No further hectorol to be given. Importance of binder compliance stressed. 2. ESRD - HD yesterday, tolerated well, net UF3.5L. Extra HD scheduled today due to increased volume but pt refused.  Under EDW, continue to titrate down volume as tolerated. Na thiosulfate with HD starting 2/1 - will write orders to continue at HD.  3. CM/CHF/MS/severe AS - Cards following. newly worsened LV fxn (EF  35-40%) Per cardiothoracic surgery, patient is not a candidate for  open surgery. May be candidate in future for TAVR, although doubtful it will significantly improve his survival. Recommend not doing anything at this time, wait and watch progression on LLE. 4. CAD - s/p cardiac cath showing 2 vessel CAD, medical therapy - ASA & statin, per cards 5. 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 1-39% b/l ICA stenosis, no thrombus on ECHO 6. Anemia of CKD- Hgb 11.1. Stable. 7. HTN -  BP elevated today, on multiple meds. To have extra HD today, should improve post. 8. Nutrition - Renal diet with fluid restricions. Low albumin. Getting outside food w/o binders - which is an issue.  9. A. Flutter - on heparin, will need long term anticoagulation  10. PVD - R AKA 11.  DM2 - per primary 11. Bipolar - per primary 12. Tobacco abuse  Jen Mow, PA-C [facility] Pager: [phone] 08/02/2017,7:45 AM  LOS: 13 days    I have seen and examined this patient and agree with plan and assessment in the above note with renal recommendations/intervention highlighted. Calciphylaxis and AoV issues as outlined. Next HD Monday as he refused extra TMT today.  Keyon Winnick B,MD 08/02/2017 11:55 AM

## 2017-08-02 NOTE — Progress Notes (Addendum)
ANTICOAGULATION CONSULT NOTE - Follow Up Consult  Pharmacy Consult:  Heparin + Coumadin Indication:  Aflutter  Patient Measurements: Height: 5\' 10"  (177.8 cm) Weight: 215 lb 13.3 oz (97.9 kg) IBW/kg (Calculated) : 73  Heparin dosing weight: 94 kg  Vital Signs: Temp: 98 F (36.7 C) (02/02 0501) Temp Source: Oral (02/02 0501) BP: 170/77 (02/02 0501) Pulse Rate: 73 (02/02 0501)  Labs: Recent Labs    07/31/17 0431  07/31/17 1958 08/01/17 0407 08/02/17 0628  HGB 10.8*  --   --  10.7* 11.1*  HCT 34.7*  --   --  34.9* 35.1*  PLT 341  --   --  373 344  LABPROT 16.6*  --   --  16.4* 16.2*  INR 1.36  --   --  1.33 1.31  HEPARINUNFRC  --    < > 0.46 0.54 0.45  CREATININE 5.32*  --   --  6.86* 5.04*   < > = values in this interval not displayed.    Estimated Creatinine Clearance: 21.7 mL/min (A) (by C-G formula based on SCr of 5.04 mg/dL (H)).   Assessment: 89 YOM who was on Coumadin PTA for history of AFib.  Patient was transitioned to IV heparin for concern of Coumadin-induced skin necrosis vs calciphylaxis. Negative for DVT. S/p skin biopsy on 07/25/17. Per cardiothoracic surgery, patient is not a candidate for open surgery at this time.  Heparin level this morning is 0.45 and within goal of 0.3-0.5. No bleeding noted at this time.   ADDENDUM: Coumadin consult now placed, will re-start Coumadin tonight per Dr. Lorrene Reid.   Goal of Therapy:  Heparin level 0.3-0.5 units/ml (concern for new CVA vs. septic emboli)  INR 2-3 Monitor platelets by anticoagulation protocol: Yes   Plan:  1. Continue Heparin drip at 2,850 units/hr (28.5 ml/hr) 2. Coumadin 5 mg x1 tonight 3. Will continue to monitor CBC/INR/heparin level and for any signs/symptoms of bleeding   Thank you for allowing pharmacy to be a part of this patient's care.  Diana L. Kyung Rudd, PharmD, [place] PGY1 Pharmacy Resident Pager: [phone]

## 2017-08-02 NOTE — Progress Notes (Signed)
Received patient transferred from [facility]. Patient alert and oriented x4. Heparin infusing at 28.5 cc/hr to Rt FA.Band aid to rt groin,S/P cardiac cath,no bruising noted. Unstageable pressure injury to sacrum,yellow slough  noted,measures 6.2 x 1.3,cleaned and covered with allevyn foam dressing. Device related pressure injury also noted on both buttocks,(bed pan mark?). Left buttock with DTI measuring 1x1,area also covered with allevyn foam. Left lower extremity with darkened areas (calcyphylaxis) with redness around them,leg elevated on pillows. Patient oriented to room and unit. Call bell within reach. Ratasha Fabre, Wonda Cheng, Therapist, sports

## 2017-08-03 LAB — GLUCOSE, CAPILLARY
GLUCOSE-CAPILLARY: 90 mg/dL (ref 65–99)
Glucose-Capillary: 99 mg/dL (ref 65–99)

## 2017-08-03 LAB — RENAL FUNCTION PANEL
Albumin: 2.5 g/dL — ABNORMAL LOW (ref 3.5–5.0)
Anion gap: 19 — ABNORMAL HIGH (ref 5–15)
BUN: 34 mg/dL — AB (ref 6–20)
CHLORIDE: 94 mmol/L — AB (ref 101–111)
CO2: 22 mmol/L (ref 22–32)
CREATININE: 6.62 mg/dL — AB (ref 0.61–1.24)
Calcium: 9.1 mg/dL (ref 8.9–10.3)
GFR calc non Af Amer: 9 mL/min — ABNORMAL LOW (ref >60)
GFR, EST AFRICAN AMERICAN: 11 mL/min — AB (ref >60)
Glucose, Bld: 110 mg/dL — ABNORMAL HIGH (ref 65–99)
POTASSIUM: 5.2 mmol/L — AB (ref 3.5–5.1)
Phosphorus: 5.4 mg/dL — ABNORMAL HIGH (ref 2.5–4.6)
Sodium: 135 mmol/L (ref 135–145)

## 2017-08-03 LAB — CBC
HCT: 37.3 % — ABNORMAL LOW (ref 39.0–52.0)
HEMOGLOBIN: 11.6 g/dL — AB (ref 13.0–17.0)
MCH: 27.6 pg (ref 26.0–34.0)
MCHC: 31.1 g/dL (ref 30.0–36.0)
MCV: 88.6 fL (ref 78.0–100.0)
Platelets: 377 10*3/uL (ref 150–400)
RBC: 4.21 MIL/uL — AB (ref 4.22–5.81)
RDW: 18.9 % — ABNORMAL HIGH (ref 11.5–15.5)
WBC: 10.6 10*3/uL — AB (ref 4.0–10.5)

## 2017-08-03 LAB — PROTIME-INR
INR: 1.27
PROTHROMBIN TIME: 15.8 s — AB (ref 11.4–15.2)

## 2017-08-03 LAB — HEPARIN LEVEL (UNFRACTIONATED): Heparin Unfractionated: 0.41 IU/mL (ref 0.30–0.70)

## 2017-08-03 MED ORDER — SEVELAMER CARBONATE 800 MG PO TABS: 1600.0000 mg | ORAL_TABLET | Freq: Two times a day (BID) | ORAL | Status: DC | PRN

## 2017-08-03 MED ORDER — WARFARIN SODIUM 7.5 MG PO TABS
7.5000 mg | ORAL_TABLET | Freq: Once | ORAL | Status: AC
  Administered 2017-08-03: 7.5 mg via ORAL
  Filled 2017-08-03: qty 1

## 2017-08-03 NOTE — Progress Notes (Signed)
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 XR showed calcified vessels without evidence of emphysema or fracture. Vancomycin and zosyn were given, nephrology consulted for HD while inpatient, and he was admitted. Fistulogram performed 1/22 showed widely patent AVF circuit. The patient grew more lethargic following the procedure. Neurology was consulted, sedating medications held. EEG showed nonspecific slowing. MRI showed multiple small supra- and infratentorial diffusion abnormalities most consistent with infarcts spanning multiple vascular territories. Work up of stroke included echocardiogram which showed severe aortic and mitral stenosis as well as new wall motion abnormalities. Cardiology was consulted and CT surgery evaluation is pending to decide on further actions.    Assessment & Plan:   Active Problems:   History of partial Right nephrectomy for renal mass (2008)   End stage renal disease on dialysis [facility])   History of CVA (cerebrovascular accident)   Atherosclerosis of native arteries of the extremities with ulceration ([place])   Above knee amputation of right lower extremity (HCC)   ESRD on hemodialysis ([place])   Complete heart block ([place]) - s/p PPM   Atrial flutter ([place]); CHA2DS2Vasc -5, on warfarin   Hyperlipidemia with target low density lipoprotein (LDL) cholesterol less than 70 mg/dL   Cellulitis and abscess of left lower extremity   Severe aortic stenosis   Calciphylaxis   Assessment & Plan: Cellulitis complicating  calciphylaxis of left lower extremity: He has risk factors including h/o DM, PVD w/hx right AKA. U/S negative for DVT. Blood cultures negative.  - Erythema worsening/stable despite many days of broad spectrum abx.  -this is all related to calciphylaxis.  -  biopsy results from 1/25, showed VASCULITIS WITH CALCIFICATION MOST CONSISTENT WITH CALCIPHYLAXIS. - Holding coumadin, continue heparin, holding Ca. Phos binder per nephrology. Pt not adherent.  - Pain control,  with oxycodone, decreased dose with lethargy. -ID consulted. They evaluated patient on 1-28 recommend to stop antibiotics. Diagnosis consistent with calciphylaxis.  -started on renvela and thiosulfate. Appreciate Dr Lorrene Reid help.  Stable. He will need referral to local pain clinic. CM consulted.   CVA: Seen on motion-degraded MRI. Multiple vascular distributions involved consistent with cardioembolic/septic emboli. Echo showed no thrombus. Carotid U/S limited by pt movement showed 1-39% stenosis of bilateral ICA's. 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  - Stroke team signed off, no new recommendations at this time.  - Continue statin - IV heparin, will require ongoing anticoagulation. Resume coumadin.   Acute systolic CHF with severe aortic stenosis, mitral stenosis: Echo with newly depressed LVEF to 35-40% from 60-65% with akinesis of mid-apicalanteroseptal myocardium. No wall motion abnormalities on last echo in Aug 2018. Also showed severe aortic stenosis, mitral stenosis. Pt has no chest pain. -cath , 2 vessels CAD. Medical management  -CT angio, coronary amenable for TAVAR  -evaluated by Dr Cyndia Bent, patient is too high risk for open heart surgery, no candidate for TAVR at this time due to concerns with his LE issues. Recommend to follow course of LE calciphylaxis over few weeks , months.,  -Dr Percival Spanish recommend palliative care consult. Discussed with wife, will consult palliative  care.   ESRD on hemodialysiswith mild hyperkalemia and mild  anemia - Nephrology providing HD on schedule.   Acute metabolic encephalopathy: Resolving. With waxing/waning course, suspect delirium worsened by sedating medications and CVA. EEG 1/23 nonspecific slowing without epileptiform discharges.  - Ammonia mildly elevated, gave lactulose, though had significant diarrhea and mental status improved. Holding for now.  - Hold sedating medications as able.  -oxycodone dose decreased.   Above knee amputation of right lower extremity - PT evaluation, eventual dispo to CIR anticipated.  Complete heart block: s/p PPM placement Nov 2018.  - No signs pacemaker is malfunctioning.   Atrial flutter: CHA2DS2-VASc Scoreis 5 - continue coreg. - Heparin per pharmacy, back on coumadin.   Hyperlipidemia: - Continue statin.   Bipolar/depression:  - Continue depakote and lexapro     DVT prophylaxis: IV heparin.  Code Status: full code.  Family Communication: care discussed with patient and wife Disposition Plan: to be determine. CIR vs SNF  Consultants:   Nephrology  IR  Neurology  General surgery  Cardiology  Cardiothoracic surgery  ID       Procedures:   HD Echocardiogram 07/24/2017: Akinesis of the distal anteroseptal wall and apex with overall moderate LV dysfunction; severe LVH; heavily calcified aortic valve with severe AS (mean gradient 60 mmHg); severe MAC with severe MS (mean gradient 12 mmHg); moderate LAE; moderate RVE; mild TR.     Antimicrobials:    Subjective: He is sleepy, wake up answer questions. He report pain as moderate in his leg. He was able to sleep better last night.   Objective: Vitals:   08/02/17 0845 08/02/17 1825 08/02/17 2141 08/03/17 0514  BP: 135/76 (!) 146/71 (!) 154/73 135/69  Pulse: 70 70 79 74  Resp: _0 Temp: 98.5 F (36.9 C) 98 F (36.7 C) 98.5 F (36.9 C) 97.8 F (36.6 C)  TempSrc: Oral Oral Oral Oral  SpO2: 93% 94% 90% 99%  Weight:   97.8 kg  (215 lb 9.8 oz)   Height:        Intake/Output Summary (Last 24 hours) at 08/03/2017 1016 Last data filed at 08/03/2017 0529 Gross per 24 hour  Intake 963.25 ml  Output 0 ml  Net 963.25 ml   Filed Weights   08/01/17 1203 08/01/17 2344 08/02/17 2141  Weight: 96.7 kg (213 lb 3 oz) 97.9 kg (215 lb 13.3 oz) 97.8 kg (215 lb 9.8 oz)    Examination:  General exam: NAD Respiratory system: CTA Cardiovascular system: S 1, S 2 RRR Gastrointestinal system:  BS present, soft, nt Central nervous system: al;ert follows command.  Extremities: Right AKA, left LE with erythema improving and superficial necrosis stable.     Data Reviewed: I have personally reviewed following labs and imaging studies  CBC: Recent Labs  Lab 07/30/17 0558 07/31/17 0431 08/01/17 0407 08/02/17 0628 08/03/17 0902  WBC 11.0* 10.3 10.7* 10.5 10.6*  HGB 11.1* 10.8* 10.7* 11.1* 11.6*  HCT 35.3* 34.7* 34.9* 35.1* 37.3*  MCV 88.3 88.5 88.8 87.3 88.6  PLT 346 341 373 344 299   Basic Metabolic Panel: Recent Labs  Lab 07/29/17 1244 07/30/17 0558 07/31/17 0431 08/01/17 0407 08/02/17 0628  NA 134* 136 138 134* 138  K 4.2 4.2 3.9 4.5 4.0  CL 94* 96* 98* 95* 95*  CO2 _1 GLUCOSE 104* 110* 81 81 99  BUN 34* 44* 26* 37* 22*  CREATININE 6.40* 7.22* 5.32* 6.86* 5.04*  CALCIUM 8.4* 8.4* 8.5*  8.8* 8.7*  PHOS 5.3* 5.9* 5.5* 6.8* 4.0   GFR: Estimated Creatinine Clearance: 21.7 mL/min (A) (by C-G formula based on SCr of 5.04 mg/dL (H)). Liver Function Tests: Recent Labs  Lab 07/29/17 1244 07/30/17 0558 07/31/17 0431 08/01/17 0407 08/02/17 0628  ALBUMIN 2.5* 2.4* 2.4* 2.5* 2.4*   No results for input(s): LIPASE, AMYLASE in the last 168 hours. No results for input(s): AMMONIA in the last 168 hours. Coagulation Profile: Recent Labs  Lab 07/29/17 1244 07/30/17 0558 07/31/17 0431 08/01/17 0407 08/02/17 0628  INR 1.31 1.33 1.36 1.33 1.31   Cardiac Enzymes: No results for input(s): CKTOTAL,  CKMB, CKMBINDEX, TROPONINI in the last 168 hours. BNP (last 3 results) No results for input(s): PROBNP in the last 8760 hours. HbA1C: No results for input(s): HGBA1C in the last 72 hours. CBG: Recent Labs  Lab 08/01/17 0651 08/01/17 0928 08/01/17 2216 08/02/17 0736 08/03/17 0737  GLUCAP 89 88 102* 98 90   Lipid Profile: No results for input(s): CHOL, HDL, LDLCALC, TRIG, CHOLHDL, LDLDIRECT in the last 72 hours. Thyroid Function Tests: No results for input(s): TSH, T4TOTAL, FREET4, T3FREE, THYROIDAB in the last 72 hours. Anemia Panel: No results for input(s): VITAMINB12, FOLATE, FERRITIN, TIBC, IRON, RETICCTPCT in the last 72 hours. Sepsis Labs: No results for input(s): PROCALCITON, LATICACIDVEN in the last 168 hours.  Recent Results (from the past 240 hour(s))  MRSA PCR Screening     Status: None   Collection Time: 08/01/17 11:53 PM  Result Value Ref Range Status   MRSA by PCR NEGATIVE NEGATIVE Final    Comment:        The GeneXpert MRSA Assay (FDA approved for NASAL specimens only), is one component of a comprehensive MRSA colonization surveillance program. It is not intended to diagnose MRSA infection nor to guide or monitor treatment for MRSA infections. Performed at [facility], [place] [address], [place], [place] [address]          Radiology Studies: No results found.      Scheduled Meds: . amLODipine  10 mg Oral Daily  . aspirin  81 mg Oral Daily  . atorvastatin  80 mg Oral q1800  . carvedilol  25 mg Oral BID WC  . cinacalcet  90 mg Oral Q breakfast  . cloNIDine  0.2 mg Transdermal Q Tue  . divalproex  500 mg Oral Q12H  . escitalopram  10 mg Oral Daily  . ferric citrate  630 mg Oral TID WC  . gabapentin  100 mg Oral BID  . hydrALAZINE  100 mg Oral Q8H  . irbesartan  300 mg Oral Daily  . isosorbide dinitrate  10 mg Oral BID  . multivitamin  1 tablet Oral QHS  . nicotine  21 mg Transdermal Daily  . sevelamer carbonate  2,400 mg Oral TID WC    . sodium chloride flush  3 mL Intravenous Q12H  . Warfarin - Pharmacist Dosing Inpatient   Does not apply q1800   Continuous Infusions: . sodium chloride    . sodium chloride    . ferric gluconate (FERRLECIT/NULECIT) IV 125 mg (08/01/17 0958)  . heparin 2,850 Units/hr (08/03/17 0246)  . sodium thiosulfate infusion for calciphylaxis       LOS: 14 days    Time spent: 35 minutes.     Elmarie Shiley, MD [name] Pager [phone]  If 7PM-7AM, please contact night-coverage [email or web address] Password [email or web address] 08/03/2017, 10:16 AM

## 2017-08-03 NOTE — Progress Notes (Signed)
ANTICOAGULATION CONSULT NOTE - Follow Up Consult  Pharmacy Consult:  Heparin + Coumadin Indication:  Aflutter  Patient Measurements: Height: 5\' 10"  (177.8 cm) Weight: 215 lb 9.8 oz (97.8 kg) IBW/kg (Calculated) : 73  Heparin dosing weight: 94 kg  Vital Signs: Temp: 97.6 F (36.4 C) (02/03 0900) Temp Source: Oral (02/03 0900) BP: 150/71 (02/03 0900) Pulse Rate: 66 (02/03 0900)  Labs: Recent Labs    08/01/17 0407 08/02/17 0628 08/03/17 0902  HGB 10.7* 11.1* 11.6*  HCT 34.9* 35.1* 37.3*  PLT 373 344 377  LABPROT 16.4* 16.2* 15.8*  INR 1.33 1.31 1.27  HEPARINUNFRC 0.54 0.45 0.41  CREATININE 6.86* 5.04* 6.62*    Estimated Creatinine Clearance: 16.5 mL/min (A) (by C-G formula based on SCr of 6.62 mg/dL (H)).   Assessment: 68 YOM who was on Coumadin PTA for history of AFib.  Patient was transitioned to IV heparin for concern of Coumadin-induced skin necrosis vs calciphylaxis. Now back on Coumadin. Negative for DVT. S/p skin biopsy on 07/25/17. Per cardiothoracic surgery, patient is not a candidate for open surgery at this time.  Heparin level this morning is 0.41 and within goal of 0.3-0.5.  INR today: 1.27 No bleeding noted at this time. CBC stable.  Goal of Therapy:  Heparin level 0.3-0.5 units/ml (concern for new CVA vs. septic emboli)  INR 2-3 Monitor platelets by anticoagulation protocol: Yes   Plan:  - Heparin drip 2850 units/hr (28.5 ml/hr) (Goal 0.3-0.5) - Coumadin 7.5 mg x1 tonight - Discontinue heparin when INR >2 - Daily heparin level, INR, and CBC - Monitor for s/sx bleeding  Thank you for allowing pharmacy to be a part of this patient's care.  Nikeisha Klutz L. Kyung Rudd, PharmD, [place] PGY1 Pharmacy Resident Pager: [phone]

## 2017-08-03 NOTE — Progress Notes (Signed)
[facility] Progress Note   Subjective:   Feeling ok today except pain in leg. Concerned about pain control once discharged.  No new complaints. Denies SOB, edema.  Objective Vitals:   08/02/17 0845 08/02/17 1825 08/02/17 2141 08/03/17 0514  BP: 135/76 (!) 146/71 (!) 154/73 135/69  Pulse: 70 70 79 74  Resp: 20 18 19 20   Temp: 98.5 F (36.9 C) 98 F (36.7 C) 98.5 F (36.9 C) 97.8 F (36.6 C)  TempSrc: Oral Oral Oral Oral  SpO2: 93% 94% 90% 99%  Weight:   97.8 kg (215 lb 9.8 oz)   Height:       Physical Exam NAD, chronically ill appearing, obese male RRR, +3/5 blowing systolic murmur heard throughout precordium Lungs CTAB anteriorly/laterally, BS diminished,  Abdomen soft, obese, NTND Extremities:RLE: AKA, LLE: large area of eschar/necrosis w/surrounding erythema on medial calf - no edema, appears slightly improved.  Dialysis Access: LU AVF +b/t   Filed Weights   08/01/17 1203 08/01/17 2344 08/02/17 2141  Weight: 96.7 kg (213 lb 3 oz) 97.9 kg (215 lb 13.3 oz) 97.8 kg (215 lb 9.8 oz)    Intake/Output Summary (Last 24 hours) at 08/03/2017 0923 Last data filed at 08/03/2017 0529 Gross per 24 hour  Intake 963.25 ml  Output 0 ml  Net 963.25 ml    Recent Labs  Lab 08/01/17 0407 08/02/17 0628 08/03/17 0902  NA 134* 138 135  K 4.5 4.0 5.2*  CL 95* 95* 94*  CO2 24 25 22   GLUCOSE 81 99 110*  BUN 37* 22* 34*  CREATININE 6.86* 5.04* 6.62*  CALCIUM 8.8* 8.7* 9.1  PHOS 6.8* 4.0 5.4*    Recent Labs  Lab 07/31/17 0431 08/01/17 0407 08/02/17 0628  ALBUMIN 2.4* 2.5* 2.4*    Recent Labs  Lab 07/29/17 1244 07/30/17 0558 07/31/17 0431 08/01/17 0407 08/02/17 0628  WBC 11.5* 11.0* 10.3 10.7* 10.5  HGB 11.2* 11.1* 10.8* 10.7* 11.1*  HCT 35.7* 35.3* 34.7* 34.9* 35.1*  MCV 88.1 88.3 88.5 88.8 87.3  PLT 314 346 341 373 344       Component Value Date/Time   SDES BLOOD RIGHT HAND 07/19/2017 2330   SPECREQUEST  07/19/2017 2330    BOTTLES DRAWN  AEROBIC ONLY Blood Culture adequate volume   CULT NO GROWTH 5 DAYS 07/19/2017 2330   REPTSTATUS 07/25/2017 FINAL 07/19/2017 2330     Recent Labs  Lab 08/01/17 0651 08/01/17 0928 08/01/17 2216 08/02/17 0736 08/03/17 0737  GLUCAP 89 88 102* 98 90    Lab Results  Component Value Date   INR 1.31 08/02/2017   INR 1.33 08/01/2017   INR 1.36 07/31/2017    Medications: . sodium chloride    . sodium chloride    . ferric gluconate (FERRLECIT/NULECIT) IV 125 mg (08/01/17 0958)  . heparin 2,850 Units/hr (08/03/17 0246)  . sodium thiosulfate infusion for calciphylaxis     . amLODipine  10 mg Oral Daily  . aspirin  81 mg Oral Daily  . atorvastatin  80 mg Oral q1800  . carvedilol  25 mg Oral BID WC  . cinacalcet  90 mg Oral Q breakfast  . cloNIDine  0.2 mg Transdermal Q Tue  . divalproex  500 mg Oral Q12H  . escitalopram  10 mg Oral Daily  . ferric citrate  630 mg Oral TID WC  . gabapentin  100 mg Oral BID  . hydrALAZINE  100 mg Oral Q8H  . irbesartan  300 mg Oral Daily  .  isosorbide dinitrate  10 mg Oral BID  . multivitamin  1 tablet Oral QHS  . nicotine  21 mg Transdermal Daily  . sevelamer carbonate  2,400 mg Oral TID WC  . sodium chloride flush  3 mL Intravenous Q12H  . Warfarin - Pharmacist Dosing Inpatient   Does not apply q1800    Dialysis Orders: MWF NW 4h  450/800  98kg  will have lower EDW at discharge 2/2.25 bath  L AVF  Hep 2400 - Hectoral 48mcg IV q HD(D/c'd) - Venofer 100mg  x 10 ordered (4 given) - Mircera 253mcg IV q 2 weeks (last 1/18) - Na thiosulfate 25 gm QHD started this hospitalization -home BP Rx norvasc 10, coreg 25 bid, clon patch 0.2 weekly, lasix 120 bid, hydral 100 tid, avapro 300   Assessment/Plan: 1. Calciphylaxis - Bx of leg eschar +calciphylaxis. Na thiosulfate started 2/1 w/HD. Appears slightly better. VERY poor P control/nonadherence to binders as outpt. On sensipar dose ^90. Now Renvela powder and Auryxia. Phos 5.4 and var  control in hospital despite forced compliance. Having difficultly getting binders at the same time as food. Orders adjusted. 2. ESRD -Refused extra treatment Saturday, continue per regular schedule. No emergent need indicated today. K 5.2 Under EDW, continue to titrate down volume. Na thiosulfate with HD starting 2/1 - orders will be sent to outpatient unit. 3. CM/CHF/MS/severe AS - Cards following. newly worsened LV fxn (EF 35-40%) Per cardiothoracic surgery, patient is not a candidate for  open surgery. May be candidate in future for TAVR, although doubtful it will significantly improve his survival. Recommend not doing anything at this time, wait and watch progression on LLE. 4. CAD - s/p cardiac cath showing 2 vessel CAD, medical therapy - ASA & statin, per cards 5. 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 1-39% b/l ICA stenosis, no thrombus on ECHO 6. Anemia of CKD-Hgb stable. Started 8 dose Fe load on 2/1. 7. HTN- meds/vol. Controlled 8. Nutrition -Renal diet with fluid restricions. Low albumin. Work on having binders at the same time as food.  9. A. Flutter - currently on heparin/pharmacy dosing coumadin.  Coumadin clinic will need to resume management as outpatient as this is not managed by dialysis centers.  10. PVD - R AKA 11.  DM2 - per primary 11. Bipolar - per primary 12. Tobacco abuse  Jen Mow, PA-C [facility] Pager: [phone] 08/03/2017,9:23 AM  LOS: 14 days   I have seen and examined this patient and agree with plan and assessment in the above note with renal recommendations/intervention highlighted. Would anticipate discharge once INR therapeutic. Long term prognosis does not (to me) look very good given severity of AS, non-cancidacy for valve replacement,  presence of calciphylaxis, and predicted ongoing medical compliance issues.    Tayvia Faughnan B,MD 08/03/2017 11:50 AM

## 2017-08-04 ENCOUNTER — Encounter (HOSPITAL_COMMUNITY): Payer: Self-pay | Admitting: Primary Care

## 2017-08-04 DIAGNOSIS — Z7189 Other specified counseling: Secondary | ICD-10-CM

## 2017-08-04 DIAGNOSIS — Z515 Encounter for palliative care: Secondary | ICD-10-CM

## 2017-08-04 LAB — RENAL FUNCTION PANEL
Albumin: 2.4 g/dL — ABNORMAL LOW (ref 3.5–5.0)
Anion gap: 22 — ABNORMAL HIGH (ref 5–15)
BUN: 42 mg/dL — AB (ref 6–20)
CHLORIDE: 93 mmol/L — AB (ref 101–111)
CO2: 20 mmol/L — ABNORMAL LOW (ref 22–32)
CREATININE: 7.74 mg/dL — AB (ref 0.61–1.24)
Calcium: 9.1 mg/dL (ref 8.9–10.3)
GFR calc Af Amer: 9 mL/min — ABNORMAL LOW (ref >60)
GFR, EST NON AFRICAN AMERICAN: 8 mL/min — AB (ref >60)
Glucose, Bld: 62 mg/dL — ABNORMAL LOW (ref 65–99)
Phosphorus: 6 mg/dL — ABNORMAL HIGH (ref 2.5–4.6)
Potassium: 5 mmol/L (ref 3.5–5.1)
Sodium: 135 mmol/L (ref 135–145)

## 2017-08-04 LAB — CBC
HCT: 34.7 % — ABNORMAL LOW (ref 39.0–52.0)
Hemoglobin: 10.8 g/dL — ABNORMAL LOW (ref 13.0–17.0)
MCH: 27.3 pg (ref 26.0–34.0)
MCHC: 31.1 g/dL (ref 30.0–36.0)
MCV: 87.8 fL (ref 78.0–100.0)
Platelets: 364 10*3/uL (ref 150–400)
RBC: 3.95 MIL/uL — ABNORMAL LOW (ref 4.22–5.81)
RDW: 18.6 % — AB (ref 11.5–15.5)
WBC: 11 10*3/uL — AB (ref 4.0–10.5)

## 2017-08-04 LAB — PROTIME-INR
INR: 1.42
Prothrombin Time: 17.2 seconds — ABNORMAL HIGH (ref 11.4–15.2)

## 2017-08-04 LAB — HEPARIN LEVEL (UNFRACTIONATED): HEPARIN UNFRACTIONATED: 0.26 [IU]/mL — AB (ref 0.30–0.70)

## 2017-08-04 MED ORDER — SODIUM CHLORIDE 0.9 % IV SOLN: 100.0000 mL | INTRAVENOUS | Status: DC | PRN

## 2017-08-04 MED ORDER — ALTEPLASE 2 MG IJ SOLR: 2.0000 mg | Freq: Once | INTRAMUSCULAR | Status: DC | PRN

## 2017-08-04 MED ORDER — HEPARIN SODIUM (PORCINE) 1000 UNIT/ML DIALYSIS: 1000.0000 [IU] | INTRAMUSCULAR | Status: DC | PRN

## 2017-08-04 MED ORDER — LIDOCAINE HCL (PF) 1 % IJ SOLN: 5.0000 mL | INTRAMUSCULAR | Status: DC | PRN

## 2017-08-04 MED ORDER — PENTAFLUOROPROP-TETRAFLUOROETH EX AERO: 1.0000 "application " | INHALATION_SPRAY | CUTANEOUS | Status: DC | PRN

## 2017-08-04 MED ORDER — ACETAMINOPHEN 325 MG PO TABS
650.0000 mg | ORAL_TABLET | Freq: Four times a day (QID) | ORAL | Status: DC | PRN
  Administered 2017-08-04: 650 mg via ORAL
  Filled 2017-08-04: qty 2

## 2017-08-04 MED ORDER — LIDOCAINE-PRILOCAINE 2.5-2.5 % EX CREA: 1.0000 "application " | TOPICAL_CREAM | CUTANEOUS | Status: DC | PRN

## 2017-08-04 MED ORDER — WARFARIN SODIUM 7.5 MG PO TABS
7.5000 mg | ORAL_TABLET | Freq: Once | ORAL | Status: AC
  Administered 2017-08-04: 7.5 mg via ORAL
  Filled 2017-08-04: qty 1

## 2017-08-04 NOTE — Progress Notes (Signed)
ANTICOAGULATION CONSULT NOTE - Follow Up Consult  Pharmacy Consult for Heparin and Coumadin Indication: atrial fibrillation  Allergies  Allergen Reactions  . Dilaudid [Hydromorphone Hcl] Other (See Comments)    ABNORMAL BEHAVIOR, "VERBALLY AND PHYSICALLY ABUSIVE," PSYCHOSIS  . Morphine And Related Itching  . Adhesive [Tape] Other (See Comments)    Redness from adhesive tape if left on too long, paper tape is preferred    Patient Measurements: Height: 5\' 10"  (182.9 cm) Weight: 211 lb 10.3 oz (96 kg) IBW/kg (Calculated) : 73 Heparin Dosing Weight: 94 kg  Vital Signs: Temp: 98.1 F (36.7 C) (02/04 1337) Temp Source: Oral (02/04 1337) BP: 144/61 (02/04 1530) Pulse Rate: 79 (02/04 1337)  Labs: Recent Labs    08/02/17 0628 08/03/17 0902 08/04/17 0656  HGB 11.1* 11.6* 10.8*  HCT 35.1* 37.3* 34.7*  PLT 344 377 364  LABPROT 16.2* 15.8* 17.2*  INR 1.31 1.27 1.42  HEPARINUNFRC 0.45 0.41 0.26*  CREATININE 5.04* 6.62* 7.74*   ESRD  Assessment:  51 YOM who was on Coumadin PTA for history of AFib, CVA. Patient was transitioned to IV heparin for concern of Coumadin-induced skin necrosis vs calciphylaxis. Negative for DVT. Now back on Coumadin. 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 Aortic and mitral stenosis, TCTS consulted; not a candidate forvalve surgery at this time.     Heparin level 0.26 today on 2850 units/hr. Just below target range.   INR up to 1.42 after Coumadin 5 mg then 7.5 mg.    Home Coumadin regimen: 5 mg alternating with 7.5 mg.  Goal of Therapy:  INR 2-3 Heparin level 0.3-0.5 units/ml Monitor platelets by anticoagulation protocol: Yes   Plan:   Increase heparin drip to 2900 units/hr.  Coumadin 7.5 mg again today.  Daily heparin level, PT/INR and CBC.  Arty Baumgartner, [place] Pager: [phone] 08/04/2017,5:02 PM

## 2017-08-04 NOTE — Progress Notes (Signed)
PT Cancellation Note  Patient Details Name: JAICE DIGIOIA MRN: [id number] DOB: [birth year]   Cancelled Treatment:    Reason Eval/Treat Not Completed: Patient at procedure or test/unavailable   Currently in HD;  Will follow up later today as time allows;  Otherwise, will follow up for PT tomorrow;   Thank you,  Roney Marion, PT  Acute Rehabilitation Services Pager [phone] Office [place] 08/04/2017, 8:36 AM
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
## 2017-08-04 NOTE — Progress Notes (Signed)
Physical Therapy Treatment Patient Details Name: Hayden [name] MRN: [id number] DOB: 12-01-1970 Today's Date: 08/04/2017    History of Present Illness Pt is a 47 y/o male with PMH significant of stroke, seizure (last 2-3 years ago), migraine, kidney cancer s/p partial nephrectomy, HTN, chronic dCHF, HLD, ESRD, depression, bipolar, atrial flutter, h/o complete AV block with pacemaker in place. Pt presented to the ED 1/19 with left lower extremity redness and pain. Pt became encephalopic and MRI on 1/23 revealed multiple small supra-and infratentorial diffusion abnormalities most consistent with infarcts spanning multiple vascular territories. Pt now s/p RIGHT/LEFT HEART CATH AND CORONARY ANGIOGRAPHY on 1/30.    PT Comments    Continuing work on functional mobility and activity tolerance;  Noted Hayden [name] has requested Palliative Care Team consult, and I look forward to more elucidation of goals of care; Today Hayden [name] worked well with PT, even after HD; performed a transfer OOB to recliner with +2 min assist; we were able to initiate gentle LLE stretching (he had been premedicated for pain)  Follow Up Recommendations  CIR;Supervision/Assistance - 24 hour     Equipment Recommendations  None recommended by PT    Recommendations for Other Services       Precautions / Restrictions Precautions Precautions: Fall Restrictions Weight Bearing Restrictions: No    Mobility  Bed Mobility Overal bed mobility: Needs Assistance Bed Mobility: Supine to Sit;Rolling Rolling: Mod assist   Supine to sit: Min guard     General bed mobility comments: Cues and mod assist to roll fo rpad placement; Minguard assist, and pt used rails to pull to sit  Transfers Overall transfer level: Needs assistance Equipment used: (Bed Pad) Transfers: Anterior-Posterior Transfer       Anterior-Posterior transfers: +2 physical assistance;Min assist   General transfer comment: pt able to use bilateral UEs  onto bed to assist with scooting, bed pads and min A x2 required to achieve full sitting position in recliner chair  Ambulation/Gait                 Stairs            Wheelchair Mobility    Modified Rankin (Stroke Patients Only)       Balance     Sitting balance-Leahy Scale: Fair(Approaching Good)                                      Cognition Arousal/Alertness: Awake/alert Behavior During Therapy: WFL for tasks assessed/performed Overall Cognitive Status: Within Functional Limits for tasks assessed                                 General Comments: Less confused this session      Exercises      General Comments General comments (skin integrity, edema, etc.): Tends to keep LLE in hip externally rotated, knee flexed position; We discussed that to work on goals of standing/walkign with prosthesis, he must have adequate ROM with hip and knee extension; Worked on L knee extension with tactile cues and facilitation; initially with muscle guard at hamstrings, but able to extend knee to approx 10 deg shy of full extension; cues to activate L quad as well; bil gluteal setting performed x10      Pertinent Vitals/Pain Pain Assessment: Faces Faces Pain Scale: Hurts whole lot Pain Location: L LE Pain Descriptors /  Indicators: Sore;Grimacing;Guarding;Moaning Pain Intervention(s): Premedicated before session;Repositioned    Home Living                      Prior Function            PT Goals (current goals can now be found in the care plan section) Acute Rehab PT Goals Patient Stated Goal: None stated this session PT Goal Formulation: With patient/family Time For Goal Achievement: 08/05/17 Potential to Achieve Goals: Good Progress towards PT goals: Progressing toward goals    Frequency    Min 3X/week      PT Plan Current plan remains appropriate    Co-evaluation              AM-PAC PT "6 Clicks" Daily  Activity  Outcome Measure  Difficulty turning over in bed (including adjusting bedclothes, sheets and blankets)?: A Little Difficulty moving from lying on back to sitting on the side of the bed? : A Little Difficulty sitting down on and standing up from a chair with arms (e.g., wheelchair, bedside commode, etc,.)?: Unable Help needed moving to and from a bed to chair (including a wheelchair)?: A Lot Help needed walking in hospital room?: Total Help needed climbing 3-5 steps with a railing? : Total 6 Click Score: 11    End of Session Equipment Utilized During Treatment: (bed pad) Activity Tolerance: Patient tolerated treatment well Patient left: in chair;with call bell/phone within reach;with chair alarm set;with family/visitor present Nurse Communication: Mobility status;Other (comment)(options fo rback to bed) PT Visit Diagnosis: Other abnormalities of gait and mobility (R26.89)     Time: 1157-2620 PT Time Calculation (min) (ACUTE ONLY): 29 min  Charges:  $Therapeutic Activity: 23-37 mins                    G Codes:       Hayden [name], PT  Acute Rehabilitation Services Pager [phone] Office [phone]    Hayden [name] 08/04/2017, 3:19 PM

## 2017-08-04 NOTE — Progress Notes (Signed)
Notified MD Regalado that pt is still having pain after administering the Oxy and tylenol PRN. Pt is groaning and can not get comfortable.   8979 Per MD Regalado- can give the Oxy IR 10 mg now.   Paulla Fore, RN

## 2017-08-04 NOTE — Care Management Note (Addendum)
Case Management Note  Patient Details  Name: Hayden [name] MRN: [id number] Date of Birth: Apr 22, 1971  Subjective/Objective:     ESRD on HD, cellulitis complicating calciphylaxis of left lower extremity,              Action/Plan: Spoke to pt, son and wife at bedside. Wife had questions about plan for his treatment post discharge. NCM notified attending. Pt requested his SNF and physicians appts be arranged in [place]. Pt is being followed for CIR and SNF. Spoke to Palliative and consult received. Will continue to follow for dc needs. Contacted [facility], [place] and requested facesheet, and progress notes be faxed with referral.   Pt and wife had decided on IP rehab. NCM spoke to IP rehab RN Coordinator and they will set up Outpt Pain Management when is dc from CIR. Will cancel referral to [facility].   Expected Discharge Date:               Expected Discharge Plan:  IP Rehab  In-House Referral:  Clinical Social Work  Discharge planning Services  CM Consult  Post Acute Care Choice:  NA Choice offered to:  NA  DME Arranged:  N/A DME Agency:  NA  HH Arranged:  NA HH Agency:  NA  Status of Service:  In process, will continue to follow  If discussed at Long Length of Stay Meetings, dates discussed:    Additional Comments:  Erenest Rasher, RN 08/04/2017, 3:35 PM

## 2017-08-04 NOTE — Clinical Social Work Note (Addendum)
CSW talked initially with patient and his son Hayden [name] at the bedside regarding a skilled nursing facility as the back-up plan and facility responses given (this had been initiated by [facility] on the original unit patient was on). Hayden [name] was in a lot of pain so CSW talked initially with his son Hayden [name], then CSW accompanied CIR representative Hayden [name] in room to talk with wife and patient regarding d/c disposition. Wife's preference is CIR for her husband as he has been there before. CSW made aware that Hayden [name] will be person at home with patient at discharge while his mother is at work. CSW will continue to follow.  Hayden [name], MSW, LCSW Licensed Clinical Social Worker Hayden [name] [phone]

## 2017-08-04 NOTE — Progress Notes (Signed)
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 XR showed calcified vessels without evidence of emphysema or fracture. Vancomycin and zosyn were given, nephrology consulted for HD while inpatient, and he was admitted. Fistulogram performed 1/22 showed widely patent AVF circuit. The patient grew more lethargic following the procedure. Neurology was consulted, sedating medications held. EEG showed nonspecific slowing. MRI showed multiple small supra- and infratentorial diffusion abnormalities most consistent with infarcts spanning multiple vascular territories. Work up of stroke included echocardiogram which showed severe aortic and mitral stenosis as well as new wall motion abnormalities. Cardiology was consulted and CT surgery evaluation is pending to decide on further actions.    Assessment & Plan:   Active Problems:   History of partial Right nephrectomy for renal mass (2008)   End stage renal disease on dialysis [facility])   History of CVA (cerebrovascular accident)   Atherosclerosis of native arteries of the extremities with ulceration ([place])   Above knee amputation of right lower extremity (H[facility])   ESRD on hemodialysis ([place])   Complete heart block ([place]) - s/p PPM   Atrial flutter ([place]); CHA2DS2Vasc -5, on warfarin   Hyperlipidemia with target low density lipoprotein (LDL) cholesterol less than 70 mg/dL   Cellulitis and abscess of left lower extremity   Severe aortic stenosis   Calciphylaxis   Assessment & Plan: Cellulitis complicating  calciphylaxis of left lower extremity: He has risk factors including h/o DM, PVD w/hx right AKA. U/S negative for DVT. Blood cultures negative.  - Erythema worsening/stable despite many days of broad spectrum abx.  -this is all related to calciphylaxis.  -  biopsy results from 1/25, showed VASCULITIS WITH CALCIFICATION MOST CONSISTENT WITH CALCIPHYLAXIS. - Holding coumadin, continue heparin, holding Ca. Phos binder per nephrology. Pt not adherent.  - Pain control,  with oxycodone, decreased dose with lethargy. -ID consulted. They evaluated patient on 1-28 recommend to stop antibiotics. Diagnosis consistent with calciphylaxis.  -started on renvela and thiosulfate. Appreciate renal help/  Stable. He will need referral to local pain clinic. CM consulted.   CVA: Seen on motion-degraded MRI. Multiple vascular distributions involved consistent with cardioembolic/septic emboli. Echo showed no thrombus. Carotid U/S limited by pt movement showed 1-39% stenosis of bilateral ICA's. 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  - Stroke team signed off, no new recommendations at this time.  - Continue statin - IV heparin, will require ongoing anticoagulation. Resume coumadin.   Acute systolic CHF with severe aortic stenosis, mitral stenosis: Echo with newly depressed LVEF to 35-40% from 60-65% with akinesis of mid-apicalanteroseptal myocardium. No wall motion abnormalities on last echo in Aug 2018. Also showed severe aortic stenosis, mitral stenosis. Pt has no chest pain. -cath , 2 vessels CAD. Medical management  -CT angio, coronary amenable for TAVAR  -evaluated by Dr Cyndia Bent, patient is too high risk for open heart surgery, no candidate for TAVR at this time due to concerns with his LE issues. Recommend to follow course of LE calciphylaxis over few weeks , months.,  -Dr Percival Spanish recommend palliative care consult. Discussed with wife, will consult palliative care.  Awaiting palliative care meeting   ESRD on  hemodialysiswith mild hyperkalemia and mild anemia - Nephrology providing HD on schedule.   Acute metabolic encephalopathy: Resolving. With waxing/waning course, suspect delirium worsened by sedating medications and CVA. EEG 1/23 nonspecific slowing without epileptiform discharges.  - Ammonia mildly elevated, gave lactulose, though had significant diarrhea and mental status improved. Holding for now.  - Hold sedating medications as able.  -oxycodone dose decreased.   Above knee amputation of right lower extremity - PT evaluation, eventual dispo to CIR anticipated.  Complete heart block: s/p PPM placement Nov 2018.  - No signs pacemaker is malfunctioning.   Atrial flutter: CHA2DS2-VASc Scoreis 5 - continue coreg. - Heparin per pharmacy, back on coumadin.   Hyperlipidemia: - Continue statin.   Bipolar/depression:  - Continue depakote and lexapro     DVT prophylaxis: IV heparin.  Code Status: full code.  Family Communication: care discussed with patient and wife Disposition Plan: to be determine. CIR vs SNF  Consultants:   Nephrology  IR  Neurology  General surgery  Cardiology  Cardiothoracic surgery  ID       Procedures:   HD Echocardiogram 07/24/2017: Akinesis of the distal anteroseptal wall and apex with overall moderate LV dysfunction; severe LVH; heavily calcified aortic valve with severe AS (mean gradient 60 mmHg); severe MAC with severe MS (mean gradient 12 mmHg); moderate LAE; moderate RVE; mild TR.     Antimicrobials:    Subjective: He is alert, complaints of pain left leg   Objective: Vitals:   08/04/17 1100 08/04/17 1130 08/04/17 1155 08/04/17 1337  BP: (!) 154/73 (!) 146/97 136/76 134/64  Pulse: (!) 54 76 68 79  Resp:   18 18  Temp:   98.6 F (37 C) 98.1 F (36.7 C)  TempSrc:   Oral Oral  SpO2:   95% 94%  Weight:   96 kg (211 lb 10.3 oz)   Height:        Intake/Output Summary (Last 24 hours) at  08/04/2017 1424 Last data filed at 08/04/2017 1400 Gross per 24 hour  Intake 746.42 ml  Output 4000 ml  Net -3253.58 ml   Filed Weights   08/03/17 2035 08/04/17 0730 08/04/17 1155  Weight: 97.6 kg (215 lb 2.7 oz) 97.5 kg (214 lb 15.2 oz) 96 kg (211 lb 10.3 oz)    Examination:  General exam: NAD Respiratory system: CTA Cardiovascular system: S 1, S 2 RRR Gastrointestinal system:  BS present, soft, nt Central nervous system: alert, follows command   Extremities: Right AKA, left LE with erythema improving and superficial necrosis stable.     Data Reviewed: I have personally reviewed following labs and imaging studies  CBC: Recent Labs  Lab 07/31/17 0431 08/01/17 0407 08/02/17 0628 08/03/17 0902 08/04/17 0656  WBC 10.3 10.7* 10.5 10.6* 11.0*  HGB 10.8* 10.7* 11.1* 11.6* 10.8*  HCT 34.7* 34.9* 35.1* 37.3* 34.7*  MCV 88.5 88.8 87.3 88.6 87.8  PLT 341 373 344 377 213   Basic Metabolic Panel: Recent Labs  Lab 07/31/17 0431 08/01/17 0407 08/02/17 0628 08/03/17 0902 08/04/17 0656  NA 138 134* 138 135 135  K 3.9 4.5 4.0 5.2* 5.0  CL 98* 95* 95* 94* 93*  CO2 _0 20*  GLUCOSE 81 81 99 110* 62*  BUN 26* 37* 22* 34* 42*  CREATININE 5.32* 6.86* 5.04* 6.62* 7.74*  CALCIUM 8.5* 8.8* 8.7* 9.1 9.1  PHOS 5.5* 6.8* 4.0 5.4* 6.0*   GFR: Estimated Creatinine Clearance: 14  mL/min (A) (by C-G formula based on SCr of 7.74 mg/dL (H)). Liver Function Tests: Recent Labs  Lab 07/31/17 0431 08/01/17 0407 08/02/17 0628 08/03/17 0902 08/04/17 0656  ALBUMIN 2.4* 2.5* 2.4* 2.5* 2.4*   No results for input(s): LIPASE, AMYLASE in the last 168 hours. No results for input(s): AMMONIA in the last 168 hours. Coagulation Profile: Recent Labs  Lab 07/31/17 0431 08/01/17 0407 08/02/17 0628 08/03/17 0902 08/04/17 0656  INR 1.36 1.33 1.31 1.27 1.42   Cardiac Enzymes: No results for input(s): CKTOTAL, CKMB, CKMBINDEX, TROPONINI in the last 168 hours. BNP (last 3 results) No  results for input(s): PROBNP in the last 8760 hours. HbA1C: No results for input(s): HGBA1C in the last 72 hours. CBG: Recent Labs  Lab 08/01/17 0928 08/01/17 2216 08/02/17 0736 08/03/17 0737 08/03/17 2028  GLUCAP 88 102* 98 90 99   Lipid Profile: No results for input(s): CHOL, HDL, LDLCALC, TRIG, CHOLHDL, LDLDIRECT in the last 72 hours. Thyroid Function Tests: No results for input(s): TSH, T4TOTAL, FREET4, T3FREE, THYROIDAB in the last 72 hours. Anemia Panel: No results for input(s): VITAMINB12, FOLATE, FERRITIN, TIBC, IRON, RETICCTPCT in the last 72 hours. Sepsis Labs: No results for input(s): PROCALCITON, LATICACIDVEN in the last 168 hours.  Recent Results (from the past 240 hour(s))  MRSA PCR Screening     Status: None   Collection Time: 08/01/17 11:53 PM  Result Value Ref Range Status   MRSA by PCR NEGATIVE NEGATIVE Final    Comment:        The GeneXpert MRSA Assay (FDA approved for NASAL specimens only), is one component of a comprehensive MRSA colonization surveillance program. It is not intended to diagnose MRSA infection nor to guide or monitor treatment for MRSA infections. Performed at [facility], [place] [address], [place], [place] [address]          Radiology Studies: No results found.      Scheduled Meds: . amLODipine  10 mg Oral Daily  . aspirin  81 mg Oral Daily  . atorvastatin  80 mg Oral q1800  . carvedilol  25 mg Oral BID WC  . cinacalcet  90 mg Oral Q breakfast  . cloNIDine  0.2 mg Transdermal Q Tue  . divalproex  500 mg Oral Q12H  . escitalopram  10 mg Oral Daily  . ferric citrate  630 mg Oral TID WC  . gabapentin  100 mg Oral BID  . hydrALAZINE  100 mg Oral Q8H  . irbesartan  300 mg Oral Daily  . isosorbide dinitrate  10 mg Oral BID  . multivitamin  1 tablet Oral QHS  . nicotine  21 mg Transdermal Daily  . sevelamer carbonate  2,400 mg Oral TID WC  . sodium chloride flush  3 mL Intravenous Q12H  . Warfarin - Pharmacist  Dosing Inpatient   Does not apply q1800   Continuous Infusions: . sodium chloride    . heparin 2,850 Units/hr (08/04/17 4034)  . sodium thiosulfate infusion for calciphylaxis       LOS: 15 days    Time spent: 35 minutes.     Elmarie Shiley, MD [name] Pager [phone]  If 7PM-7AM, please contact night-coverage [email or web address] Password [email or web address] 08/04/2017, 2:24 PM

## 2017-08-04 NOTE — Progress Notes (Signed)
[facility] Progress Note  Subjective: L leg pain, o/w no c/o  Vitals:   08/04/17 0743 08/04/17 0748 08/04/17 0800 08/04/17 0830  BP: 128/78 136/71 128/70 (!) 142/75  Pulse: 66 72 69 70  Resp:      Temp:      TempSrc:      SpO2:      Weight:      Height:        Inpatient medications: . amLODipine  10 mg Oral Daily  . aspirin  81 mg Oral Daily  . atorvastatin  80 mg Oral q1800  . carvedilol  25 mg Oral BID WC  . cinacalcet  90 mg Oral Q breakfast  . cloNIDine  0.2 mg Transdermal Q Tue  . divalproex  500 mg Oral Q12H  . escitalopram  10 mg Oral Daily  . ferric citrate  630 mg Oral TID WC  . gabapentin  100 mg Oral BID  . hydrALAZINE  100 mg Oral Q8H  . irbesartan  300 mg Oral Daily  . isosorbide dinitrate  10 mg Oral BID  . multivitamin  1 tablet Oral QHS  . nicotine  21 mg Transdermal Daily  . sevelamer carbonate  2,400 mg Oral TID WC  . sodium chloride flush  3 mL Intravenous Q12H  . Warfarin - Pharmacist Dosing Inpatient   Does not apply q1800   . sodium chloride    . sodium chloride    . sodium chloride    . ferric gluconate (FERRLECIT/NULECIT) IV 125 mg (08/01/17 0958)  . heparin 2,850 Units/hr (08/04/17 0086)  . sodium thiosulfate infusion for calciphylaxis     sodium chloride, sodium chloride, sodium chloride, alteplase, camphor-menthol, heparin, hydrOXYzine, lidocaine (PF), lidocaine-prilocaine, nitroGLYCERIN, oxyCODONE, pentafluoroprop-tetrafluoroeth, senna-docusate, sevelamer carbonate, sodium chloride flush  Exam: Alert, pleasant, nad No jvd Chest cta bilat RRR 3/6 holosyst M Abd soft ntnd obese Ext serpentine skin necrosis R shin/ calf LUA AVF NF, ox3  Dialysis: MWF NW 4h  98kg (lower at dc) 2/2.25 bath  LUA AVF   Hep 2400 - Hectoral 55mcg IV q HD(D/c'd) - Venofer 100mg  x 10 ordered (4 given) - Mircera 279mcg IV q 2 weeks (last 1/18) - Na thiosulfate 25 gm QHD started this hospitalization -home BP-norvasc 10/ coreg 25 bid/ clon  patch 0.2 / hydral 100 tid/ avapro 300      Impression: 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 3  CM/ severe AS, not surg candidate 4  CVA mult, small 5  Aflutter, back on coumadin 6  HTN mult meds, bp ok 7  Vol under dry 8  AMS resolved 9  PVD 10  DM2 11  Bipolar 12  Anemia ckd, Hb good, will dc Fe load d/t #1 13  CAD sp cath 2V CAD, med Rx  Plan - HD , Na thio, rehab pend   Kelly Splinter MD [facility] pager [phone]   08/04/2017, 9:01 AM   Recent Labs  Lab 08/02/17 0628 08/03/17 0902 08/04/17 0656  NA 138 135 135  K 4.0 5.2* 5.0  CL 95* 94* 93*  CO2 25 22 20*  GLUCOSE 99 110* 62*  BUN 22* 34* 42*  CREATININE 5.04* 6.62* 7.74*  CALCIUM 8.7* 9.1 9.1  PHOS 4.0 5.4* 6.0*   Recent Labs  Lab 08/02/17 0628 08/03/17 0902 08/04/17 0656  ALBUMIN 2.4* 2.5* 2.4*   Recent Labs  Lab 08/02/17 0628 08/03/17 0902 08/04/17 0656  WBC 10.5 10.6* 11.0*  HGB 11.1*  11.6* 10.8*  HCT 35.1* 37.3* 34.7*  MCV 87.3 88.6 87.8  PLT 344 377 364   Iron/TIBC/Ferritin/ %Sat    Component Value Date/Time   IRON 17 (L) 07/24/2017 0758   TIBC 197 (L) 07/24/2017 0758   FERRITIN 1,427 (H) 07/24/2017 0758   IRONPCTSAT 9 (L) 07/24/2017 5956

## 2017-08-04 NOTE — Care Management Important Message (Signed)
Important Message  Patient Details  Name: Hayden [name] MRN: [id number] Date of Birth: 1970-10-30   Medicare Important Message Given:  Yes    Brittanyann Wittner P Mahogani Holohan 08/04/2017, 3:51 PM

## 2017-08-04 NOTE — Progress Notes (Signed)
Inpatient Rehabilitation  Received notification that patient is approaching medical readiness from [facility].  Met with patient and spouse at bedside to discuss team's recommendation for IP Rehab.  It is their wish for him to participate in [facility], since they are familiar with our program in order to ensure the quickest transition home.  Spouse expressed concerns about equipment as well as pain management, which will be passed off to rehab team.  Plan to follow up tomorrow for medical readiness and IP Rehab bed availability.  Call if questions.  Carmelia Roller., CCC/SLP Admission Coordinator  [facility]  Cell [phone]

## 2017-08-05 ENCOUNTER — Inpatient Hospital Stay (HOSPITAL_COMMUNITY)
Admission: RE | Admit: 2017-08-05 | Discharge: 2017-08-16 | DRG: 945 | Disposition: A | Discharge status: Home Health | Payer: Medicare Other | Source: Intra-hospital | Attending: Physical Medicine & Rehabilitation | Admitting: Physical Medicine & Rehabilitation

## 2017-08-05 ENCOUNTER — Encounter (HOSPITAL_COMMUNITY): Payer: Self-pay | Admitting: *Deleted

## 2017-08-05 DIAGNOSIS — E1122 Type 2 diabetes mellitus with diabetic chronic kidney disease: Secondary | ICD-10-CM | POA: Diagnosis present

## 2017-08-05 DIAGNOSIS — I4892 Unspecified atrial flutter: Secondary | ICD-10-CM | POA: Diagnosis present

## 2017-08-05 DIAGNOSIS — G894 Chronic pain syndrome: Secondary | ICD-10-CM | POA: Diagnosis present

## 2017-08-05 DIAGNOSIS — Z91048 Other nonmedicinal substance allergy status: Secondary | ICD-10-CM

## 2017-08-05 DIAGNOSIS — Z515 Encounter for palliative care: Secondary | ICD-10-CM

## 2017-08-05 DIAGNOSIS — R5381 Other malaise: Principal | ICD-10-CM | POA: Diagnosis present

## 2017-08-05 DIAGNOSIS — Z79891 Long term (current) use of opiate analgesic: Secondary | ICD-10-CM

## 2017-08-05 DIAGNOSIS — Z833 Family history of diabetes mellitus: Secondary | ICD-10-CM

## 2017-08-05 DIAGNOSIS — Z8673 Personal history of transient ischemic attack (TIA), and cerebral infarction without residual deficits: Secondary | ICD-10-CM | POA: Diagnosis not present

## 2017-08-05 DIAGNOSIS — E119 Type 2 diabetes mellitus without complications: Secondary | ICD-10-CM

## 2017-08-05 DIAGNOSIS — E1152 Type 2 diabetes mellitus with diabetic peripheral angiopathy with gangrene: Secondary | ICD-10-CM | POA: Diagnosis present

## 2017-08-05 DIAGNOSIS — Z950 Presence of cardiac pacemaker: Secondary | ICD-10-CM

## 2017-08-05 DIAGNOSIS — Z992 Dependence on renal dialysis: Secondary | ICD-10-CM | POA: Diagnosis not present

## 2017-08-05 DIAGNOSIS — G9349 Other encephalopathy: Secondary | ICD-10-CM | POA: Diagnosis present

## 2017-08-05 DIAGNOSIS — I2510 Atherosclerotic heart disease of native coronary artery without angina pectoris: Secondary | ICD-10-CM | POA: Diagnosis present

## 2017-08-05 DIAGNOSIS — E669 Obesity, unspecified: Secondary | ICD-10-CM

## 2017-08-05 DIAGNOSIS — Z89611 Acquired absence of right leg above knee: Secondary | ICD-10-CM

## 2017-08-05 DIAGNOSIS — M792 Neuralgia and neuritis, unspecified: Secondary | ICD-10-CM | POA: Diagnosis present

## 2017-08-05 DIAGNOSIS — E1140 Type 2 diabetes mellitus with diabetic neuropathy, unspecified: Secondary | ICD-10-CM | POA: Diagnosis present

## 2017-08-05 DIAGNOSIS — N186 End stage renal disease: Secondary | ICD-10-CM

## 2017-08-05 DIAGNOSIS — G40909 Epilepsy, unspecified, not intractable, without status epilepticus: Secondary | ICD-10-CM | POA: Diagnosis present

## 2017-08-05 DIAGNOSIS — I5042 Chronic combined systolic (congestive) and diastolic (congestive) heart failure: Secondary | ICD-10-CM | POA: Diagnosis present

## 2017-08-05 DIAGNOSIS — Z905 Acquired absence of kidney: Secondary | ICD-10-CM

## 2017-08-05 DIAGNOSIS — Z885 Allergy status to narcotic agent status: Secondary | ICD-10-CM

## 2017-08-05 DIAGNOSIS — Z713 Dietary counseling and surveillance: Secondary | ICD-10-CM

## 2017-08-05 DIAGNOSIS — I132 Hypertensive heart and chronic kidney disease with heart failure and with stage 5 chronic kidney disease, or end stage renal disease: Secondary | ICD-10-CM | POA: Diagnosis present

## 2017-08-05 DIAGNOSIS — M549 Dorsalgia, unspecified: Secondary | ICD-10-CM | POA: Diagnosis present

## 2017-08-05 DIAGNOSIS — I080 Rheumatic disorders of both mitral and aortic valves: Secondary | ICD-10-CM | POA: Diagnosis present

## 2017-08-05 DIAGNOSIS — Z66 Do not resuscitate: Secondary | ICD-10-CM | POA: Diagnosis present

## 2017-08-05 DIAGNOSIS — D631 Anemia in chronic kidney disease: Secondary | ICD-10-CM | POA: Diagnosis present

## 2017-08-05 DIAGNOSIS — I6349 Cerebral infarction due to embolism of other cerebral artery: Secondary | ICD-10-CM | POA: Diagnosis not present

## 2017-08-05 DIAGNOSIS — F17210 Nicotine dependence, cigarettes, uncomplicated: Secondary | ICD-10-CM | POA: Diagnosis present

## 2017-08-05 DIAGNOSIS — Z85528 Personal history of other malignant neoplasm of kidney: Secondary | ICD-10-CM

## 2017-08-05 DIAGNOSIS — S78111A Complete traumatic amputation at level between right hip and knee, initial encounter: Secondary | ICD-10-CM

## 2017-08-05 DIAGNOSIS — Z6829 Body mass index (BMI) 29.0-29.9, adult: Secondary | ICD-10-CM

## 2017-08-05 DIAGNOSIS — F319 Bipolar disorder, unspecified: Secondary | ICD-10-CM | POA: Diagnosis present

## 2017-08-05 DIAGNOSIS — E785 Hyperlipidemia, unspecified: Secondary | ICD-10-CM | POA: Diagnosis present

## 2017-08-05 DIAGNOSIS — I639 Cerebral infarction, unspecified: Secondary | ICD-10-CM | POA: Diagnosis present

## 2017-08-05 DIAGNOSIS — Z888 Allergy status to other drugs, medicaments and biological substances status: Secondary | ICD-10-CM

## 2017-08-05 DIAGNOSIS — I272 Pulmonary hypertension, unspecified: Secondary | ICD-10-CM | POA: Diagnosis present

## 2017-08-05 DIAGNOSIS — Z9049 Acquired absence of other specified parts of digestive tract: Secondary | ICD-10-CM

## 2017-08-05 DIAGNOSIS — Z79899 Other long term (current) drug therapy: Secondary | ICD-10-CM

## 2017-08-05 DIAGNOSIS — D638 Anemia in other chronic diseases classified elsewhere: Secondary | ICD-10-CM

## 2017-08-05 DIAGNOSIS — Z6830 Body mass index (BMI) 30.0-30.9, adult: Secondary | ICD-10-CM

## 2017-08-05 DIAGNOSIS — Z8249 Family history of ischemic heart disease and other diseases of the circulatory system: Secondary | ICD-10-CM

## 2017-08-05 DIAGNOSIS — I10 Essential (primary) hypertension: Secondary | ICD-10-CM

## 2017-08-05 DIAGNOSIS — Z993 Dependence on wheelchair: Secondary | ICD-10-CM

## 2017-08-05 DIAGNOSIS — E1169 Type 2 diabetes mellitus with other specified complication: Secondary | ICD-10-CM

## 2017-08-05 DIAGNOSIS — Z7189 Other specified counseling: Secondary | ICD-10-CM

## 2017-08-05 LAB — CBC
HEMATOCRIT: 35.9 % — AB (ref 39.0–52.0)
Hemoglobin: 11.2 g/dL — ABNORMAL LOW (ref 13.0–17.0)
MCH: 27.6 pg (ref 26.0–34.0)
MCHC: 31.2 g/dL (ref 30.0–36.0)
MCV: 88.4 fL (ref 78.0–100.0)
Platelets: 359 10*3/uL (ref 150–400)
RBC: 4.06 MIL/uL — ABNORMAL LOW (ref 4.22–5.81)
RDW: 18.5 % — ABNORMAL HIGH (ref 11.5–15.5)
WBC: 11.5 10*3/uL — AB (ref 4.0–10.5)

## 2017-08-05 LAB — RENAL FUNCTION PANEL
ALBUMIN: 2.5 g/dL — AB (ref 3.5–5.0)
Anion gap: 18 — ABNORMAL HIGH (ref 5–15)
BUN: 24 mg/dL — ABNORMAL HIGH (ref 6–20)
CALCIUM: 9.4 mg/dL (ref 8.9–10.3)
CO2: 22 mmol/L (ref 22–32)
CREATININE: 5.66 mg/dL — AB (ref 0.61–1.24)
Chloride: 93 mmol/L — ABNORMAL LOW (ref 101–111)
GFR calc non Af Amer: 11 mL/min — ABNORMAL LOW (ref >60)
GFR, EST AFRICAN AMERICAN: 13 mL/min — AB (ref >60)
Glucose, Bld: 97 mg/dL (ref 65–99)
PHOSPHORUS: 5 mg/dL — AB (ref 2.5–4.6)
Potassium: 4 mmol/L (ref 3.5–5.1)
SODIUM: 133 mmol/L — AB (ref 135–145)

## 2017-08-05 LAB — PROTIME-INR
INR: 2.03
Prothrombin Time: 22.8 seconds — ABNORMAL HIGH (ref 11.4–15.2)

## 2017-08-05 LAB — HEPARIN LEVEL (UNFRACTIONATED): HEPARIN UNFRACTIONATED: 0.5 [IU]/mL (ref 0.30–0.70)

## 2017-08-05 LAB — GLUCOSE, CAPILLARY: Glucose-Capillary: 89 mg/dL (ref 65–99)

## 2017-08-05 MED ORDER — AMLODIPINE BESYLATE 10 MG PO TABS
10.0000 mg | ORAL_TABLET | Freq: Every day | ORAL | Status: DC
  Administered 2017-08-07 – 2017-08-16 (×6): 10 mg via ORAL
  Filled 2017-08-05 (×8): qty 1

## 2017-08-05 MED ORDER — PROCHLORPERAZINE 25 MG RE SUPP: 12.5000 mg | Freq: Four times a day (QID) | RECTAL | Status: DC | PRN

## 2017-08-05 MED ORDER — ASPIRIN 81 MG PO CHEW: 81.0000 mg | CHEWABLE_TABLET | Freq: Every day | ORAL | 0 refills | Status: AC

## 2017-08-05 MED ORDER — ACETAMINOPHEN 325 MG PO TABS
325.0000 mg | ORAL_TABLET | ORAL | Status: DC | PRN
  Administered 2017-08-06: 650 mg via ORAL
  Administered 2017-08-07: 325 mg via ORAL
  Filled 2017-08-05: qty 2

## 2017-08-05 MED ORDER — LIDOCAINE 5 % EX OINT
TOPICAL_OINTMENT | Freq: Four times a day (QID) | CUTANEOUS | Status: DC
  Administered 2017-08-05 – 2017-08-16 (×13): via TOPICAL
  Filled 2017-08-05: qty 35.44

## 2017-08-05 MED ORDER — WARFARIN SODIUM 5 MG PO TABS: 5.0000 mg | ORAL_TABLET | Freq: Every day | ORAL | 0 refills | Status: DC

## 2017-08-05 MED ORDER — WARFARIN SODIUM 5 MG PO TABS
5.0000 mg | ORAL_TABLET | Freq: Once | ORAL | Status: DC
  Administered 2017-08-05: 5 mg via ORAL
  Filled 2017-08-05: qty 1

## 2017-08-05 MED ORDER — CARVEDILOL 25 MG PO TABS
25.0000 mg | ORAL_TABLET | Freq: Two times a day (BID) | ORAL | Status: DC
  Administered 2017-08-07 – 2017-08-16 (×15): 25 mg via ORAL
  Filled 2017-08-05 (×17): qty 1

## 2017-08-05 MED ORDER — DIPHENHYDRAMINE HCL 12.5 MG/5ML PO ELIX: 12.5000 mg | ORAL_SOLUTION | Freq: Four times a day (QID) | ORAL | Status: DC | PRN

## 2017-08-05 MED ORDER — OXYCODONE HCL 10 MG PO TABS: 10.0000 mg | ORAL_TABLET | ORAL | 0 refills | Status: DC | PRN

## 2017-08-05 MED ORDER — SEVELAMER CARBONATE 800 MG PO TABS
2400.0000 mg | ORAL_TABLET | Freq: Three times a day (TID) | ORAL | Status: DC
  Administered 2017-08-06 – 2017-08-16 (×27): 2400 mg via ORAL
  Filled 2017-08-05 (×29): qty 3

## 2017-08-05 MED ORDER — RENA-VITE PO TABS
1.0000 | ORAL_TABLET | Freq: Every day | ORAL | Status: DC
  Administered 2017-08-05 – 2017-08-15 (×11): 1 via ORAL
  Filled 2017-08-05 (×11): qty 1

## 2017-08-05 MED ORDER — SENNOSIDES-DOCUSATE SODIUM 8.6-50 MG PO TABS
1.0000 | ORAL_TABLET | Freq: Every evening | ORAL | Status: DC | PRN
  Administered 2017-08-08 – 2017-08-15 (×7): 1 via ORAL
  Filled 2017-08-05 (×7): qty 1

## 2017-08-05 MED ORDER — COLLAGENASE 250 UNIT/GM EX OINT
TOPICAL_OINTMENT | Freq: Every day | CUTANEOUS | Status: DC
  Administered 2017-08-06 – 2017-08-07 (×2): via TOPICAL
  Administered 2017-08-07: 1 via TOPICAL
  Administered 2017-08-09 – 2017-08-13 (×4): via TOPICAL
  Filled 2017-08-05: qty 30

## 2017-08-05 MED ORDER — GABAPENTIN 100 MG PO CAPS: 100.0000 mg | ORAL_CAPSULE | Freq: Two times a day (BID) | ORAL | 0 refills | Status: DC

## 2017-08-05 MED ORDER — COLLAGENASE 250 UNIT/GM EX OINT
TOPICAL_OINTMENT | Freq: Every day | CUTANEOUS | Status: DC
  Filled 2017-08-05: qty 30

## 2017-08-05 MED ORDER — ISOSORBIDE DINITRATE 10 MG PO TABS
10.0000 mg | ORAL_TABLET | Freq: Two times a day (BID) | ORAL | Status: DC
  Administered 2017-08-05 – 2017-08-16 (×17): 10 mg via ORAL
  Filled 2017-08-05 (×22): qty 1

## 2017-08-05 MED ORDER — CINACALCET HCL 30 MG PO TABS: 90.0000 mg | ORAL_TABLET | Freq: Every day | ORAL | 0 refills | Status: DC

## 2017-08-05 MED ORDER — FLEET ENEMA 7-19 GM/118ML RE ENEM: 1.0000 | ENEMA | Freq: Once | RECTAL | Status: DC | PRN

## 2017-08-05 MED ORDER — ALUMINUM HYDROXIDE GEL 320 MG/5ML PO SUSP
10.0000 mL | ORAL | Status: DC | PRN
  Filled 2017-08-05: qty 30

## 2017-08-05 MED ORDER — CLONIDINE HCL 0.2 MG/24HR TD PTWK
0.2000 mg | MEDICATED_PATCH | TRANSDERMAL | Status: DC
Start: 2017-08-12 — End: 2017-08-16
  Administered 2017-08-12: 0.2 mg via TRANSDERMAL
  Filled 2017-08-05: qty 1

## 2017-08-05 MED ORDER — BISACODYL 10 MG RE SUPP
10.0000 mg | Freq: Every day | RECTAL | Status: DC | PRN
  Administered 2017-08-10: 10 mg via RECTAL
  Filled 2017-08-05 (×3): qty 1

## 2017-08-05 MED ORDER — TRAZODONE HCL 50 MG PO TABS: 25.0000 mg | ORAL_TABLET | Freq: Every evening | ORAL | Status: DC | PRN

## 2017-08-05 MED ORDER — POLYETHYLENE GLYCOL 3350 17 G PO PACK
17.0000 g | PACK | Freq: Every day | ORAL | Status: DC | PRN
  Administered 2017-08-07: 17 g via ORAL
  Filled 2017-08-05: qty 1

## 2017-08-05 MED ORDER — ACETAMINOPHEN 325 MG PO TABS
650.0000 mg | ORAL_TABLET | Freq: Three times a day (TID) | ORAL | Status: DC
  Administered 2017-08-05 – 2017-08-16 (×39): 650 mg via ORAL
  Filled 2017-08-05 (×41): qty 2

## 2017-08-05 MED ORDER — SODIUM CHLORIDE 0.9% FLUSH
3.0000 mL | Freq: Two times a day (BID) | INTRAVENOUS | Status: DC
  Administered 2017-08-05 – 2017-08-06 (×3): 3 mL via INTRAVENOUS

## 2017-08-05 MED ORDER — OXYCODONE HCL 5 MG PO TABS
10.0000 mg | ORAL_TABLET | Freq: Two times a day (BID) | ORAL | Status: DC
  Administered 2017-08-06: 10 mg via ORAL
  Filled 2017-08-05: qty 2

## 2017-08-05 MED ORDER — ATORVASTATIN CALCIUM 80 MG PO TABS
80.0000 mg | ORAL_TABLET | Freq: Every day | ORAL | Status: DC
  Administered 2017-08-07 – 2017-08-15 (×8): 80 mg via ORAL
  Filled 2017-08-05 (×10): qty 1

## 2017-08-05 MED ORDER — ESCITALOPRAM OXALATE 10 MG PO TABS
10.0000 mg | ORAL_TABLET | Freq: Every day | ORAL | Status: DC
  Administered 2017-08-06 – 2017-08-16 (×11): 10 mg via ORAL
  Filled 2017-08-05 (×11): qty 1

## 2017-08-05 MED ORDER — SODIUM CHLORIDE 0.9 % IV SOLN
25.0000 g | INTRAVENOUS | Status: DC
  Administered 2017-08-08 – 2017-08-15 (×3): 25 g via INTRAVENOUS
  Filled 2017-08-05 (×7): qty 100

## 2017-08-05 MED ORDER — FERRIC CITRATE 1 GM 210 MG(FE) PO TABS
630.0000 mg | ORAL_TABLET | Freq: Three times a day (TID) | ORAL | Status: DC
  Administered 2017-08-06 – 2017-08-16 (×27): 630 mg via ORAL
  Filled 2017-08-05 (×33): qty 3

## 2017-08-05 MED ORDER — GABAPENTIN 100 MG PO CAPS
100.0000 mg | ORAL_CAPSULE | Freq: Two times a day (BID) | ORAL | Status: DC
  Administered 2017-08-05 – 2017-08-16 (×22): 100 mg via ORAL
  Filled 2017-08-05 (×22): qty 1

## 2017-08-05 MED ORDER — NICOTINE 21 MG/24HR TD PT24: 21.0000 mg | MEDICATED_PATCH | Freq: Every day | TRANSDERMAL | 0 refills | Status: DC

## 2017-08-05 MED ORDER — ASPIRIN 81 MG PO CHEW
81.0000 mg | CHEWABLE_TABLET | Freq: Every day | ORAL | Status: DC
  Administered 2017-08-07 – 2017-08-16 (×10): 81 mg via ORAL
  Filled 2017-08-05 (×10): qty 1

## 2017-08-05 MED ORDER — OXYCODONE HCL 5 MG PO TABS
10.0000 mg | ORAL_TABLET | ORAL | Status: DC | PRN
  Administered 2017-08-05: 10 mg via ORAL
  Filled 2017-08-05: qty 2

## 2017-08-05 MED ORDER — CINACALCET HCL 30 MG PO TABS
90.0000 mg | ORAL_TABLET | Freq: Every day | ORAL | Status: DC
  Administered 2017-08-06 – 2017-08-16 (×10): 90 mg via ORAL
  Filled 2017-08-05 (×12): qty 3

## 2017-08-05 MED ORDER — SEVELAMER CARBONATE 800 MG PO TABS
1600.0000 mg | ORAL_TABLET | Freq: Two times a day (BID) | ORAL | Status: DC | PRN
  Administered 2017-08-05: 1600 mg via ORAL
  Filled 2017-08-05: qty 2

## 2017-08-05 MED ORDER — HYDRALAZINE HCL 50 MG PO TABS
100.0000 mg | ORAL_TABLET | Freq: Three times a day (TID) | ORAL | Status: DC
  Administered 2017-08-05 – 2017-08-16 (×27): 100 mg via ORAL
  Filled 2017-08-05 (×26): qty 2

## 2017-08-05 MED ORDER — SODIUM CHLORIDE 0.9% FLUSH: 3.0000 mL | INTRAVENOUS | Status: DC | PRN

## 2017-08-05 MED ORDER — ATORVASTATIN CALCIUM 80 MG PO TABS: 80.0000 mg | ORAL_TABLET | Freq: Every day | ORAL | 0 refills | Status: DC

## 2017-08-05 MED ORDER — SEVELAMER CARBONATE 800 MG PO TABS: 1600.0000 mg | ORAL_TABLET | Freq: Two times a day (BID) | ORAL | 0 refills | Status: DC | PRN

## 2017-08-05 MED ORDER — WARFARIN SODIUM 5 MG PO TABS: 5.0000 mg | ORAL_TABLET | Freq: Once | ORAL | Status: DC

## 2017-08-05 MED ORDER — DIVALPROEX SODIUM 125 MG PO CSDR: 500.0000 mg | DELAYED_RELEASE_CAPSULE | Freq: Two times a day (BID) | ORAL | 0 refills | Status: DC

## 2017-08-05 MED ORDER — OXYCODONE HCL ER 10 MG PO T12A: 20.0000 mg | EXTENDED_RELEASE_TABLET | Freq: Two times a day (BID) | ORAL | Status: DC

## 2017-08-05 MED ORDER — PROCHLORPERAZINE MALEATE 5 MG PO TABS: 5.0000 mg | ORAL_TABLET | Freq: Four times a day (QID) | ORAL | Status: DC | PRN

## 2017-08-05 MED ORDER — SEVELAMER CARBONATE 800 MG PO TABS: 2400.0000 mg | ORAL_TABLET | Freq: Three times a day (TID) | ORAL | 0 refills | Status: DC

## 2017-08-05 MED ORDER — OXYCODONE HCL 5 MG PO TABS
10.0000 mg | ORAL_TABLET | ORAL | Status: DC | PRN
  Administered 2017-08-05 – 2017-08-16 (×17): 10 mg via ORAL
  Filled 2017-08-05 (×15): qty 2

## 2017-08-05 MED ORDER — IRBESARTAN 300 MG PO TABS
300.0000 mg | ORAL_TABLET | Freq: Every day | ORAL | Status: DC
  Administered 2017-08-07 – 2017-08-16 (×6): 300 mg via ORAL
  Filled 2017-08-05 (×8): qty 1

## 2017-08-05 MED ORDER — LIDOCAINE 5 % EX OINT
TOPICAL_OINTMENT | Freq: Four times a day (QID) | CUTANEOUS | Status: DC
  Administered 2017-08-05: 18:00:00 via TOPICAL
  Filled 2017-08-05: qty 35.44

## 2017-08-05 MED ORDER — WARFARIN - PHARMACIST DOSING INPATIENT: Freq: Every day | Status: DC

## 2017-08-05 MED ORDER — CAMPHOR-MENTHOL 0.5-0.5 % EX LOTN
TOPICAL_LOTION | CUTANEOUS | Status: DC | PRN
  Administered 2017-08-05: 23:00:00 via TOPICAL
  Filled 2017-08-05: qty 222

## 2017-08-05 MED ORDER — NICOTINE 21 MG/24HR TD PT24
21.0000 mg | MEDICATED_PATCH | Freq: Every day | TRANSDERMAL | Status: DC
  Administered 2017-08-07 – 2017-08-15 (×8): 21 mg via TRANSDERMAL
  Filled 2017-08-05 (×8): qty 1

## 2017-08-05 MED ORDER — DIVALPROEX SODIUM 125 MG PO CSDR
500.0000 mg | DELAYED_RELEASE_CAPSULE | Freq: Two times a day (BID) | ORAL | Status: DC
  Administered 2017-08-05 – 2017-08-16 (×22): 500 mg via ORAL
  Filled 2017-08-05 (×22): qty 4

## 2017-08-05 MED ORDER — PROCHLORPERAZINE EDISYLATE 5 MG/ML IJ SOLN: 5.0000 mg | Freq: Four times a day (QID) | INTRAMUSCULAR | Status: DC | PRN

## 2017-08-05 MED ORDER — NITROGLYCERIN 0.4 MG SL SUBL
0.4000 mg | SUBLINGUAL_TABLET | SUBLINGUAL | Status: DC | PRN
Start: 2017-08-05 — End: 2017-08-16

## 2017-08-05 MED ORDER — GUAIFENESIN-DM 100-10 MG/5ML PO SYRP: 5.0000 mL | ORAL_SOLUTION | Freq: Four times a day (QID) | ORAL | Status: DC | PRN

## 2017-08-05 MED ORDER — OXYCODONE HCL ER 20 MG PO T12A: 20.0000 mg | EXTENDED_RELEASE_TABLET | Freq: Two times a day (BID) | ORAL | 0 refills | Status: DC
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
## 2017-08-05 NOTE — Progress Notes (Signed)
Patient received at 62 with wife and mother in law at bedside. Patient sleeping arousable but sleeping. Patient on overlay mattress. Oriented patient and family to room and call system. Family verbalized understanding of admission process. Continue with plan of care.  Hayden [name]

## 2017-08-05 NOTE — Progress Notes (Signed)
[facility] Progress Note  Subjective: L leg pain  Vitals:   08/04/17 1734 08/04/17 2205 08/05/17 0835 08/05/17 1157  BP: (!) 143/95 (!) 144/69 138/67 (!) 144/76  Pulse: 81 67 80 77  Resp: 17 16 18 20   Temp: 98.9 F (37.2 C) 99.1 F (37.3 C) 97.8 F (36.6 C) 98 F (36.7 C)  TempSrc: Oral Oral Oral Oral  SpO2: 93% 92% 93%   Weight:  96 kg (211 lb 10.3 oz)    Height:        Inpatient medications: . amLODipine  10 mg Oral Daily  . aspirin  81 mg Oral Daily  . atorvastatin  80 mg Oral q1800  . carvedilol  25 mg Oral BID WC  . cinacalcet  90 mg Oral Q breakfast  . cloNIDine  0.2 mg Transdermal Q Tue  . divalproex  500 mg Oral Q12H  . escitalopram  10 mg Oral Daily  . ferric citrate  630 mg Oral TID WC  . gabapentin  100 mg Oral BID  . hydrALAZINE  100 mg Oral Q8H  . irbesartan  300 mg Oral Daily  . isosorbide dinitrate  10 mg Oral BID  . multivitamin  1 tablet Oral QHS  . nicotine  21 mg Transdermal Daily  . sevelamer carbonate  2,400 mg Oral TID WC  . sodium chloride flush  3 mL Intravenous Q12H  . warfarin  5 mg Oral ONCE-1800  . Warfarin - Pharmacist Dosing Inpatient   Does not apply q1800   . sodium chloride    . sodium thiosulfate infusion for calciphylaxis     sodium chloride, acetaminophen, camphor-menthol, hydrOXYzine, nitroGLYCERIN, oxyCODONE, senna-docusate, sevelamer carbonate, sodium chloride flush  Exam: Alert, pleasant, nad No jvd Chest cta bilat RRR 3/6 holosyst M Abd soft ntnd obese Ext serpentine skin necrosis R shin/ calf LUA AVF NF, ox3  Dialysis: MWF [place] 4h  98kg (lower at dc) 2/2.25 bath  LUA AVF   Hep 2400 - Hectoral 68mcg IV q HD(D/c'd) - Venofer 100mg  x 10 ordered (4 given) - Mircera 262mcg IV q 2 weeks (last 1/18) - Na thiosulfate 25 gm QHD started this hospitalization -home BP-norvasc 10/ coreg 25 bid/ clon patch 0.2 / hydral 100 tid/ avapro 300      Impression: 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 3  CM/ severe  AS, not surg candidate 4  CVA mult, small 5  Aflutter, back on coumadin 6  HTN mult meds, bp ok 7  Vol under dry 8  AMS resolved 9  PVD 10  DM2 11  Bipolar 12  Anemia ckd, Hb good, will dc Fe load d/t #1 13  CAD sp cath 2V CAD, med Rx  Plan - HD , Na thio, rehab pend   Kelly Splinter MD [facility] pager [phone]   08/05/2017, 1:48 PM   Recent Labs  Lab 08/03/17 0902 08/04/17 0656 08/05/17 0820  NA 135 135 133*  K 5.2* 5.0 4.0  CL 94* 93* 93*  CO2 22 20* 22  GLUCOSE 110* 62* 97  BUN 34* 42* 24*  CREATININE 6.62* 7.74* 5.66*  CALCIUM 9.1 9.1 9.4  PHOS 5.4* 6.0* 5.0*   Recent Labs  Lab 08/03/17 0902 08/04/17 0656 08/05/17 0820  ALBUMIN 2.5* 2.4* 2.5*   Recent Labs  Lab 08/03/17 0902 08/04/17 0656 08/05/17 0820  WBC 10.6* 11.0* 11.5*  HGB 11.6* 10.8* 11.2*  HCT 37.3* 34.7* 35.9*  MCV 88.6 87.8 88.4  PLT  377 364 359   Iron/TIBC/Ferritin/ %Sat    Component Value Date/Time   IRON 17 (L) 07/24/2017 0758   TIBC 197 (L) 07/24/2017 0758   FERRITIN 1,427 (H) 07/24/2017 0758   IRONPCTSAT 9 (L) 07/24/2017 4970

## 2017-08-05 NOTE — Progress Notes (Signed)
Patient discharged to Inpatient Rehab in air mattress bed [phone].  Given report and answered all the questions.  Took all his belongings with him.

## 2017-08-05 NOTE — ACP (Advance Care Planning) (Signed)
Advance Care Planning Note  Advance directives were discussed. Patient would not want to be long term on life support or have artificial nutrition.  We discussed code status. Due to his cardiac illness, advanced renal disease, calciphylaxis, patient chooses DNR status. He and his wife understand this to mean that if he does not have a pulse and is not breathing, he chooses not to be intubated, receive CPR or cardiac medications, or have code blue status called.   Mariana Kaufman, AGNP-C Palliative Medicine  Please call Palliative Medicine team phone with any questions [phone]. For individual providers please see AMION.

## 2017-08-05 NOTE — Progress Notes (Signed)
Inpatient Rehabilitation  I have received medical clearance and have an IP Rehab bed available to offer today.  Plan to proceed with admission.  Call if questions.   Carmelia Roller., CCC/SLP Admission Coordinator  [place]  Cell [phone]
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
## 2017-08-05 NOTE — Progress Notes (Addendum)
ANTICOAGULATION CONSULT NOTE - Follow Up Consult  Pharmacy Consult for Heparin and Coumadin Indication: atrial fibrillation  Allergies  Allergen Reactions  . Dilaudid [Hydromorphone Hcl] Other (See Comments)    ABNORMAL BEHAVIOR, "VERBALLY AND PHYSICALLY ABUSIVE," PSYCHOSIS  . Morphine And Related Itching  . Adhesive [Tape] Other (See Comments)    Redness from adhesive tape if left on too long, paper tape is preferred    Patient Measurements: Height: 5\' 10"  (177.8 cm) Weight: 211 lb 10.3 oz (96 kg) IBW/kg (Calculated) : 73 Heparin Dosing Weight: 94 kg  Vital Signs: Temp: 97.8 F (36.6 C) (02/05 0835) Temp Source: Oral (02/05 0835) BP: 138/67 (02/05 0835) Pulse Rate: 80 (02/05 0835)  Labs: Recent Labs    08/03/17 0902 08/04/17 0656 08/05/17 0820 08/05/17 1014  HGB 11.6* 10.8* 11.2*  --   HCT 37.3* 34.7* 35.9*  --   PLT 377 364 359  --   LABPROT 15.8* 17.2*  --  22.8*  INR 1.27 1.42  --  2.03  HEPARINUNFRC 0.41 0.26*  --  0.50  CREATININE 6.62* 7.74* 5.66*  --    ESRD  Assessment:  48 YOM who was on Coumadin PTA for history of AFib, CVA. Patient was transitioned to IV heparin for concern of Coumadin-induced skin necrosis vs calciphylaxis. Negative for DVT. Now back on Coumadin. 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 Aortic and mitral stenosis, TCTS consulted; not a candidate forvalve surgery at this time.     Heparin level 0.50 today on 2900 units/hr. Level was 0.26 (low) on 2850 units/hr on 244   INR up to 2.03 after Coumadin 5 mg x 1 then 7.5 mg x 2 days.    Home Coumadin regimen: 5 mg alternating with 7.5 mg.  Goal of Therapy:  INR 2-3 Heparin level 0.3-0.5 units/ml Monitor platelets by anticoagulation protocol: Yes   Plan:   Continue heparin drip at 2900 units/hr.  Coumadin 5 mg today.  IV heparin discontinued with INR >2. Dw/ Dr. Tyrell Antonio.  Daily heparin level, PT/INR and CBC.  Arty Baumgartner, [place] Pager: [phone] 08/05/2017,11:43 AM

## 2017-08-05 NOTE — Discharge Summary (Addendum)
Physician Discharge Summary  Hayden [name] NOB:[id number] DOB: 1971-03-12 DOA: 07/19/2017  PCP: [name], DO  Admit date: 07/19/2017 Discharge date: 08/05/2017  Admitted From:  Home  Disposition:  CIR  Recommendations for Outpatient Follow-up:  1. Please check INR daily, adjust coumadin as needed.  2. Monitor on change of pain medications.  3. 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 XR showed calcified vessels without evidence of emphysema or fracture. Vancomycin and zosyn were given, nephrology consulted for HD while inpatient, and he was admitted. Fistulogram performed 1/22 showed widely patent AVF circuit. The patient grew more lethargic following the procedure. Neurology was consulted, sedating medications held. EEG showed nonspecific slowing. MRI showed multiple small supra- and infratentorial diffusion abnormalities most consistent with infarcts spanning multiple vascular territories.Work up of stroke included echocardiogram which showed severe aortic and mitral stenosis as well as new wall motion abnormalities. Cardiology was consulted and CT surgery evaluation is pending to decide on further actions.   Assessment & Plan:   Active Problems:   History of partial Right nephrectomy for renal mass (2008)   End stage renal disease on dialysis 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 U/S negative for DVT. Blood cultures negative.  - Erythema worsening/stable despite many days of broad spectrum abx.  -this is all related to calciphylaxis.  -  biopsy results from 1/25, showed VASCULITIS WITH CALCIFICATION MOST CONSISTENT WITH CALCIPHYLAXIS. - Holding coumadin, continue heparin, holding Ca. Phos binder per nephrology.Pt not adherent. - Pain control,  with oxycodone, decreased dose with lethargy. -ID consulted. They evaluated patient on 1-28 recommend to stop antibiotics. Diagnosis consistent with calciphylaxis.  -Started on renvela and thiosulfate. Appreciate renal help/  -Stable. He will need referral to local pain clinic. CM consulted.   HUT:MLYY on motion-degraded MRI. Multiple vascular distributions involved consistent with cardioembolic/septic emboli. Echo showed no thrombus. Carotid U/S limited by pt movement showed 1-39% stenosis of bilateral ICA's. 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  - Stroke team signed off, no new recommendations at this time.  - Continue statin - he was bridge with heparin. Continue with coumadin.   Acutesystolic CHF with severe aortic stenosis, mitral stenosis:Echo with newly depressed LVEF to 35-40% from 60-65% with akinesis of mid-apicalanteroseptal myocardium. No wall motion abnormalities on last echo in Aug 2018. Also showed severe aortic stenosis, mitral stenosis. Pt has no chest pain. -cath , 2 vessels CAD. Medical management  -CT angio, coronary amenable for TAVAR  -evaluated by  Dr Cyndia Bent, patient is too high risk for open heart surgery, no candidate for TAVR at this time due to concerns with his LE issues. Recommend to follow course of LE calciphylaxis over few weeks , months.,  -Dr Percival Spanish recommend palliative care consult. Discussed with wife, will consult palliative care.  Plan for CIR, palliative met with patient and family, pain medications adjusted, patient is now DNR.   ESRD on hemodialysiswith mild hyperkalemia and mild anemia - Nephrology providing HD on schedule.   Acute metabolic encephalopathy: Resolving. With waxing/waning course, suspect delirium worsened by sedating medications and CVA. EEG 1/23 nonspecific slowing without epileptiform discharges.  - Ammonia mildly elevated, gave lactulose, though had significant diarrhea and mental status improved. Holding for now.  - monitor on increase dose of pain medications.   Above knee amputation of right lower extremity - PT evaluation, eventual dispo to CIRanticipated.  Complete heart block: s/p PPM placement Nov 2018.  - No signs pacemaker is malfunctioning.   Atrial flutter: CHA2DS2-VASc Scoreis 5 - continue coreg. - , back on coumadin. INR at 2. Discontinue heparin. Repeat INR in am.  Adjust coumadin as needed.   Hyperlipidemia: - Continue statin.   Bipolar/depression:  - Continue depakote and lexapro  Un-stageable sacrum wound; POA; no. Local care.     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   Atrial flutter ([place]); CHA2DS2Vasc -5, on warfarin   Hyperlipidemia with target low density lipoprotein (LDL) cholesterol less than 70  mg/dL   Cellulitis and abscess of left lower extremity   Severe aortic stenosis   Calciphylaxis   Palliative care encounter   Goals of care, counseling/discussion   Palliative care by specialist   Advance care planning    Discharge Instructions  Discharge Instructions    Ambulatory referral to Neurology   Complete by:  As directed    An appointment is requested in approximately: 6 weeks Follow up with stroke clinic (Dr Leonie Man preferred, if not available, then consider Caesar Chestnut, [facility] or Jaynee Eagles whoever is available) at [facility] in about 6-8 weeks. Thanks.   Diet - low sodium heart healthy   Complete by:  As directed    Diet - low sodium heart healthy   Complete by:  As directed    Diet Carb Modified   Complete by:  As directed    Discharge instructions   Complete by:  As directed    You were admitted for cellulitis of the left lower leg which has improved. You are stable for discharge with ongoing antibiotics. the fistulogram showed no abnormalities! - You will continue to receive vancomycin on hemodialysis days - You will need to take keflex once daily as well. You need to take this AFTER dialysis on MWF.  - Continue taking coumadin. A refill was sent to your pharmacy, though you will need to follow up with your outpatient providers for ongoing care.  - The redness and pain will very slowly improve. If you experience a fever or the area becomes more painful/red, seek medical attention.   Increase activity slowly   Complete by:  As directed    Increase activity slowly  Complete by:  As directed      Allergies as of 08/05/2017      Reactions   Dilaudid [hydromorphone Hcl] Other (See Comments)   ABNORMAL BEHAVIOR, "VERBALLY AND PHYSICALLY ABUSIVE," PSYCHOSIS   Morphine And Related Itching   Adhesive [tape] Other (See Comments)   Redness from adhesive tape if left on too long, paper tape is preferred      Medication List    STOP taking these medications   ALPRAZolam 1 MG  tablet Commonly known as:  XANAX   furosemide 80 MG tablet Commonly known as:  LASIX   oxyCODONE-acetaminophen 5-325 MG tablet Commonly known as:  PERCOCET/ROXICET   traMADol 50 MG tablet Commonly known as:  ULTRAM     TAKE these medications   amLODipine 10 MG tablet Commonly known as:  NORVASC Take 1 tablet (10 mg total) by mouth daily.   aspirin 81 MG chewable tablet Chew 1 tablet (81 mg total) by mouth daily. Start taking on:  08/06/2017   atorvastatin 80 MG tablet Commonly known as:  LIPITOR Take 1 tablet (80 mg total) by mouth daily at 6 PM.   carvedilol 25 MG tablet Commonly known as:  COREG Take 1 tablet (25 mg total) by mouth 2 (two) times daily.   cinacalcet 30 MG tablet Commonly known as:  SENSIPAR Take 3 tablets (90 mg total) by mouth daily with breakfast. Start taking on:  08/06/2017 What changed:    medication strength  how much to take  when to take this   cloNIDine 0.2 mg/24hr patch Commonly known as:  CATAPRES - Dosed in mg/24 hr Place 1 patch (0.2 mg total) onto the skin once a week. What changed:  when to take this   divalproex 125 MG capsule Commonly known as:  DEPAKOTE SPRINKLE Take 4 capsules (500 mg total) by mouth every 12 (twelve) hours.   escitalopram 10 MG tablet Commonly known as:  LEXAPRO Take 10 mg daily by mouth.   ferric citrate 1 GM 210 MG(Fe) tablet Commonly known as:  AURYXIA Take 2 tablets (420 mg total) by mouth 3 (three) times daily with meals. What changed:    how much to take  when to take this  additional instructions   gabapentin 100 MG capsule Commonly known as:  NEURONTIN Take 1 capsule (100 mg total) by mouth 2 (two) times daily. What changed:  when to take this   hydrALAZINE 100 MG tablet Commonly known as:  APRESOLINE Take 1 tablet (100 mg total) by mouth every 8 (eight) hours.   irbesartan 300 MG tablet Commonly known as:  AVAPRO Take 1 tablet (300 mg total) daily by mouth.   isosorbide dinitrate  10 MG tablet Commonly known as:  ISORDIL TAKE 1 TABLET BY MOUTH TWICE DAILY   multivitamin Tabs tablet Take 1 tablet by mouth at bedtime.   nicotine 21 mg/24hr patch Commonly known as:  NICODERM CQ - dosed in mg/24 hours Place 1 patch (21 mg total) onto the skin daily.   nitroGLYCERIN 0.4 MG SL tablet Commonly known as:  NITROSTAT Place 0.4 mg under the tongue every 5 (five) minutes as needed for chest pain.   omeprazole 40 MG capsule Commonly known as:  PRILOSEC Take 1 capsule (40 mg total) by mouth daily.   Oxycodone HCl 10 MG Tabs Take 1 tablet (10 mg total) by mouth every 4 (four) hours as needed for severe pain.   oxyCODONE 20 mg 12 hr tablet Commonly known as:  OXYCONTIN Take  1 tablet (20 mg total) by mouth every 12 (twelve) hours.   pravastatin 40 MG tablet Commonly known as:  PRAVACHOL Take 1 tablet (40 mg total) by mouth every evening. What changed:  when to take this   sevelamer carbonate 800 MG tablet Commonly known as:  RENVELA Take 2 tablets (1,600 mg total) by mouth 2 (two) times daily between meals as needed (with snacks).   sevelamer carbonate 800 MG tablet Commonly known as:  RENVELA Take 3 tablets (2,400 mg total) by mouth 3 (three) times daily with meals.   warfarin 5 MG tablet Commonly known as:  COUMADIN Take as directed. If you are unsure how to take this medication, talk to your nurse or doctor. Original instructions:  Take 1 tablet (5 mg total) by mouth daily at 6 PM. What changed:    how much to take  how to take this  when to take this  additional instructions      Follow-up Information    Le, Thao P, DO. Schedule an appointment as soon as possible for a visit in 1 week(s).   Specialty:  Family Medicine Contact information: [place] [address] [phone]        Garvin Fila, MD. Schedule an appointment as soon as possible for a visit in 6 week(s).   Specialties:  Neurology, Radiology Contact  information: [address] [place] [address] [phone]          Allergies  Allergen Reactions  . Dilaudid [Hydromorphone Hcl] Other (See Comments)    ABNORMAL BEHAVIOR, "VERBALLY AND PHYSICALLY ABUSIVE," PSYCHOSIS  . Morphine And Related Itching  . Adhesive [Tape] Other (See Comments)    Redness from adhesive tape if left on too long, paper tape is preferred    Consultations:  Cardiology  Nephrology   Palliative   CVTS   Procedures/Studies: Dg Tibia/fibula Left  Result Date: 07/20/2017 CLINICAL DATA:  Cellulitis EXAM: LEFT TIBIA AND FIBULA - 2 VIEW COMPARISON:  None. FINDINGS: There is no evidence of fracture or other focal bone lesions. No soft tissue emphysema. Extensive vascular and soft tissue calcification. IMPRESSION: No soft tissue emphysema.  Severe vascular calcification. Electronically Signed   By: Ulyses Jarred M.D.   On: 07/20/2017 00:08   Ct Head Wo Contrast  Result Date: 07/21/2017 CLINICAL DATA:  Altered level of consciousness and confusion. EXAM: CT HEAD WITHOUT CONTRAST TECHNIQUE: Contiguous axial images were obtained from the base of the skull through the vertex without intravenous contrast. COMPARISON:  05/16/2017 FINDINGS: Brain: Stable small lacunar infarct of right basal ganglia. Old lacunar infarcts also suspected in the left cerebellar hemisphere and left thalamus. The brain demonstrates no evidence of hemorrhage, acute infarction, edema, mass effect, extra-axial fluid collection, hydrocephalus or mass lesion. Vascular: Stable calcifications involving the intracranial internal carotid arteries, basilar artery and vertebral arteries. No hyperdense thrombus identified. Skull: Normal. Negative for fracture or focal lesion. Sinuses/Orbits: No acute finding. Other: None. IMPRESSION: No acute findings.  Old lacunar infarcts present. Electronically Signed   By: Aletta Edouard M.D.   On: 07/21/2017 18:11   Mr Brain Wo Contrast  Result Date:  07/23/2017 CLINICAL DATA:  Encephalopathy, altered mental status. Suspect toxic metabolic encephalopathy. History of end-stage renal disease on dialysis, seizures, atrial fibrillation on Coumadin, kidney cancer, hypertension, hyperlipidemia. EXAM: MRI HEAD WITHOUT CONTRAST TECHNIQUE: Axial diffusion weighted imaging. Examination prematurely terminated due to patient's combative behavior. COMPARISON:  CT HEAD July 21, 2017 FINDINGS: Severely motion  degraded single axial diffusion weighted sequence. Multifocal reduced diffusion bilateral cerebellum, anterior and RIGHT parietal lobes. Low ADC values on identifiable lesions. IMPRESSION: 1. Limited motion degraded single axial diffusion weighted sequence. 2. Multiple small supra-and infratentorial diffusion abnormalities most consistent with infarcts spanning multiple vascular territories, less likely hyperacute demyelination, septic emboli, or hypercellular metastasis. 3. Acute findings text paged to [place], Neurology via AM[employer] secure system on 07/23/2017 at 9:15 pm. Electronically Signed   By: Elon Alas M.D.   On: 07/23/2017 21:17   Ct Coronary Morph W/cta Cor W/score W/ca W/cm &/or Wo/cm  Addendum Date: 07/31/2017   ADDENDUM REPORT: 07/31/2017 16:23 ADDENDUM: Please see separate dictation for contemporaneously obtained CTA chest, abdomen and pelvis 07/31/2017 for full description of relevant extracardiac findings. Electronically Signed   By: Vinnie Langton M.D.   On: 07/31/2017 16:23   Result Date: 07/31/2017 CLINICAL DATA:  Aortic Stenosis EXAM: Cardiac TAVR CT TECHNIQUE: The patient was scanned on a Siemens Force 546 slice scanner. A 120 kV retrospective scan was triggered in the ascending thoracic aorta at 140 HU's. Gantry rotation speed was 250 msecs and collimation was .6 mm. No beta blockade or nitro were given. The 3D data set was reconstructed in 5% intervals of the R-R cycle. Systolic and diastolic phases were analyzed on a dedicated  work station using MPR, MIP and VRT modes. The patient received 80 cc of contrast. FINDINGS: Aortic Valve: Calcified tri-leaflet with restricted motion Aorta: Marked calcification of the arch and descending thoracic aorta Sino-tubular Junction: 31 mm Ascending Thoracic Aorta: 36 mm Aortic Arch: 30 mm Descending Thoracic Aorta: 29 mm Sinus of Valsalva Measurements: Non-coronary: 34.5 mm Right - coronary: 32.9 mm Left -   coronary: 32.7 mm Coronary Artery Height above Annulus: Left Main: 12.7 mm above annulus Right Coronary: 15 mm above annulus Virtual Basal Annulus Measurements: Maximum / Minimum Diameter: 24.2 mm x 31 mm Perimeter: 89 mm Area: 613 mm2 Coronary Arteries: Sufficient height above annulus for deployment Optimum Fluoroscopic Angle for Delivery: LAO 13 degrees Caudal 15 degrees IMPRESSION: 1. Calcified Tri-leaflet AV with annular area of 613 mm2 suitable for a 29 mm Sapien 3 valve 2. Marked calcific atherosclerosis of the aortic arch and descending thoracic aorta 3. Optimum angiographic angle for deployment LAO 13 degrees Caudal 15 degrees 4.  Coronary arteries sufficient height above annulus for deployment Overall poor quality study for measurements due to poor opacification Jenkins Rouge Electronically Signed: By: Jenkins Rouge M.D. On: 07/31/2017 15:51   Ct Angio Chest Aorta W/cm &/or Wo/cm  Result Date: 07/31/2017 CLINICAL DATA:  47 year old male with history of severe aortic stenosis. Preprocedural study prior to potential transcatheter aortic valve replacement (TAVR) procedure. EXAM: CT ANGIOGRAPHY CHEST, ABDOMEN AND PELVIS TECHNIQUE: Multidetector CT imaging through the chest, abdomen and pelvis was performed using the standard protocol during bolus administration of intravenous contrast. Multiplanar reconstructed images and MIPs were obtained and reviewed to evaluate the vascular anatomy. CONTRAST:  59m ISOVUE-370 IOPAMIDOL (ISOVUE-370) INJECTION 76% COMPARISON:  CT abdomen and pelvis  02/07/2017.  Chest CT 11/15/2015. FINDINGS: CTA CHEST FINDINGS Cardiovascular: Heart size is enlarged with left ventricular hypertrophy. There is no significant pericardial fluid, thickening or pericardial calcification. There is aortic atherosclerosis, as well as atherosclerosis of the great vessels of the mediastinum and the coronary arteries, including calcified atherosclerotic plaque in the left main, left anterior descending, left circumflex and right coronary arteries. Severe calcifications of the aortic valve and mitral annulus right-sided pacemaker device with lead tips terminating in  the right atrium and right ventricular apex. Mediastinum/Lymph Nodes: Multiple prominent borderline enlarged mediastinal and hilar lymph nodes are noted, but are nonspecific. Esophagus is unremarkable in appearance. No axillary lymphadenopathy. Lungs/Pleura: Mild diffuse ground-glass attenuation and mild interlobular septal thickening, suggestive of a background of mild interstitial pulmonary edema. Trace right pleural effusion. No left pleural effusion. No consolidative airspace disease. No suspicious appearing pulmonary nodules or masses. Musculoskeletal/Soft Tissues: There are no aggressive appearing lytic or blastic lesions noted in the visualized portions of the skeleton. CTA ABDOMEN AND PELVIS FINDINGS Hepatobiliary: Liver has a slightly shrunken appearance and nodular contour, suggesting underlying cirrhosis. No definite suspicious appearing cystic or solid hepatic lesions. No intra or extrahepatic biliary ductal dilatation. Status post cholecystectomy. Pancreas: No pancreatic mass. No pancreatic ductal dilatation. No pancreatic or peripancreatic fluid or inflammatory changes. Spleen: Unremarkable. Adrenals/Urinary Tract: 2.0 x 1.5 cm fatty attenuation lesion in the left adrenal gland, compatible with a small adrenal myelolipoma. Right adrenal gland is normal. Mild atrophy of both kidneys. No suspicious appearing renal  lesions. No hydroureteronephrosis. Urinary bladder is unremarkable in appearance. Stomach/Bowel: Normal appearance of the stomach. No pathologic dilatation of small bowel or colon. Normal appendix. Vascular/Lymphatic: Aortic atherosclerosis, with vascular findings and measurements pertinent to potential TAVR procedure, as detailed below. 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 Suboptimal contrast bolus on today's examination limits accurate assessment of other major abdominal vessels for potential hemodynamically significant stenosis, but there is considerable atherosclerotic disease in these vessels, particularly in the proximal celiac axis and superior mesenteric artery, both of which likely demonstrate at least moderate stenosis. No lymphadenopathy noted in the abdomen or pelvis. Reproductive: Prostate gland and seminal vesicles are unremarkable in appearance. Other: Trace volume of ascites.  No pneumoperitoneum. Musculoskeletal: There are no aggressive appearing lytic or blastic lesions noted in the visualized portions of the skeleton. VASCULAR MEASUREMENTS PERTINENT TO TAVR: AORTA: Minimal Aortic Diameter-10 x 13 mm Severity of Aortic Calcification-severe RIGHT PELVIS: Right Common Iliac Artery - Minimal Diameter-10.6 x 9.1 mm Tortuosity-mild Calcification-moderate to severe Right External Iliac Artery - Minimal Diameter-9.6 x 7.9 mm Tortuosity-mild Calcification-moderate Right Common Femoral Artery - Minimal Diameter-7.9 x 6.2 mm Tortuosity-mild Calcification-moderate LEFT PELVIS: Left Common Iliac Artery - Minimal Diameter-9.3 x 7.6 mm Tortuosity-mild Calcification-moderate to severe Left External Iliac Artery - Minimal Diameter-9.6 x 10.2 mm Tortuosity-mild Calcification-moderate Left Common Femoral Artery - Minimal Diameter-10.3 x 7.9 mm Tortuosity-mild Calcification-moderate Review of the MIP images confirms the above findings. IMPRESSION: 1. Vascular findings  and measurements pertinent to potential TAVR procedure, as detailed above. 2. Severe thickening calcification of the aortic valve, compatible with the reported clinical history of severe aortic stenosis. 3. 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 4. Cardiomegaly with left ventricular hypertrophy. 5. 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 6. Aortic atherosclerosis, in addition to left main and 3 vessel coronary artery disease. Please note that although the presence of coronary artery calcium documents the presence of coronary artery disease, the severity of this disease and any potential stenosis cannot be assessed on this non-gated CT examination. Assessment for potential risk factor modification, dietary therapy or pharmacologic therapy may be warranted, if clinically indicated. 7. Additional incidental findings, as above. Aortic Atherosclerosis (ICD10-I70.0). Electronically Signed   By: Vinnie Langton M.D.   On: 07/31/2017 16:59   Ir Dialy Shunt Intro Needle/intracath Initial W/img Left  Result Date: 07/22/2017 INDICATION: Prolonged bleeding after dialysis. EXAM: LEFT WRIST AV FISTULOGRAM MEDICATIONS:  None.  75 cc Isovue 300. ANESTHESIA/SEDATION: None FLUOROSCOPY TIME:  Fluoroscopy Time:  minutes 12 seconds (102 mGy). COMPLICATIONS: None immediate. PROCEDURE: Informed written consent was obtained from the patient after a thorough discussion of the procedural risks, benefits and alternatives. All questions were addressed. Maximal Sterile Barrier Technique was utilized including caps, mask, sterile gowns, sterile gloves, sterile drape, hand hygiene and skin antiseptic. A timeout was performed prior to the initiation of the procedure. The left wrist was prepped and draped in a sterile fashion. An 18 gauge Angiocath was inserted into the outflow forearm vein. Contrast was injected for imaging. The Angiocath was removed and hemostasis was achieved with direct pressure. FINDINGS: The wrist  arteriovenous anastomosis, outflow forearm veins, upper arm veins, and central venous structures are all widely patent. There are multiple competing venous structures in the forearm. IMPRESSION: Left wrist AV fistula circuit is widely patent. ACCESS: This access remains amenable to future percutaneous interventions as clinically indicated. Electronically Signed   By: Marybelle Killings M.D.   On: 07/22/2017 12:14   Vas [place] Burnard Bunting With/wo Tbi  Result Date: 07/24/2017 LOWER EXTREMITY DOPPLER STUDY Indications: Cellulitis. Other Factors: PAD. Right BKA 12/04/16. Examination Guidelines: A complete evaluation includes B-mode imaging, spectral doppler, color doppler, and power doppler as needed of all accessible portions of each vessel. Bilateral testing is considered an integral part of a complete examination. Limited examinations for reoccurring indications may be performed as noted. Unable to ascertain ABI secondary to severe cellulitis and pain. Toe pressure may not be accurate as patient could not remain still. Prior exam from 11/06/2016 is available for comparison.  ABI Findings: +--------+------------------+-----+---------+--------+ Right   Rt Pressure (mmHg)IndexWaveform Comment  +--------+------------------+-----+---------+--------+ [id number]                    triphasic         +--------+------------------+-----+---------+--------+ +---------+------------------+-----+----------+-------+ Left     Lt Pressure (mmHg)IndexWaveform  Comment +---------+------------------+-----+----------+-------+ ATA                             monophasic        +---------+------------------+-----+----------+-------+ PTA                             monophasic        +---------+------------------+-----+----------+-------+ Raymon Mutton                                       +---------+------------------+-----+----------+-------+ +-------+-----------+-----------+------------+------------+ ABI/TBIToday's  ABIToday's TBIPrevious ABIPrevious TBI +-------+-----------+-----------+------------+------------+ Right                                    calcified    +-------+-----------+-----------+------------+------------+ Left                                     calcified    +-------+-----------+-----------+------------+------------+  Final Interpretation: Left: Unable to obtain ABI's because of cellulitis and pt could not remain still. Abnormal doppler signals at rest. Toe pressure of 23 mm Hg suggests significant arterial occlusive disease.  *See table(s) above for measurements and observations.  Electronically signed by Deitra Mayo on 07/24/2017 at 4:59:35 PM   Vas [place] Lower Extremity Venous (dvt)  Result Date: 07/21/2017  Lower Venous Study Indication: Pain. Examination Guidelines: A complete evaluation includes B-mode imaging, spectral doppler, color doppler, and power doppler as needed of all accessible portions of each vessel. Bilateral testing is considered an integral part of a complete examination. Limited examinations for reoccurring indications may be performed as noted.  Right Venous Findings: +---+---------------+---------+-----------+----------+-------------------------+    CompressibilityPhasicitySpontaneityPropertiesSummary                   +---+---------------+---------+-----------+----------+-------------------------+ CFV                                             Unable to evaluate due to                                                 poor patient cooperatio   +---+---------------+---------+-----------+----------+-------------------------+  Left Venous Findings: +---------+---------------+---------+-----------+----------+-------------------+            Ct Angio Abd/pel W/ And/or W/o  Result Date: 07/31/2017 CLINICAL DATA:  47 year old male with history of severe aortic stenosis. Preprocedural study prior to potential transcatheter aortic valve  replacement (TAVR) procedure. EXAM: CT ANGIOGRAPHY CHEST, ABDOMEN AND PELVIS TECHNIQUE: Multidetector CT imaging through the chest, abdomen and pelvis was performed using the standard protocol during bolus administration of intravenous contrast. Multiplanar reconstructed images and MIPs were obtained and reviewed to evaluate the vascular anatomy. CONTRAST:  5m ISOVUE-370 IOPAMIDOL (ISOVUE-370) INJECTION 76% COMPARISON:  CT abdomen and pelvis 02/07/2017.  Chest CT 11/15/2015. FINDINGS: CTA CHEST FINDINGS Cardiovascular: Heart size is enlarged with left ventricular hypertrophy. There is no significant pericardial fluid, thickening or pericardial calcification. There is aortic atherosclerosis, as well as atherosclerosis of the great vessels of the mediastinum and the coronary arteries, including calcified atherosclerotic plaque in the left main, left anterior descending, left circumflex and right coronary arteries. Severe calcifications of the aortic valve and mitral annulus right-sided pacemaker device with lead tips terminating in the right atrium and right ventricular apex. Mediastinum/Lymph Nodes: Multiple prominent borderline enlarged mediastinal and hilar lymph nodes are noted, but are nonspecific. Esophagus is unremarkable in appearance. No axillary lymphadenopathy. Lungs/Pleura: Mild diffuse ground-glass attenuation and mild interlobular septal thickening, suggestive of a background of mild interstitial pulmonary edema. Trace right pleural effusion. No left pleural effusion. No consolidative airspace disease. No suspicious appearing pulmonary nodules or masses. Musculoskeletal/Soft Tissues: There are no aggressive appearing lytic or blastic lesions noted in the visualized portions of the skeleton. CTA ABDOMEN AND PELVIS FINDINGS Hepatobiliary: Liver has a slightly shrunken appearance and nodular contour, suggesting underlying cirrhosis. No definite suspicious appearing cystic or solid hepatic lesions. No  intra or extrahepatic biliary ductal dilatation. Status post cholecystectomy. Pancreas: No pancreatic mass. No pancreatic ductal dilatation. No pancreatic or peripancreatic fluid or inflammatory changes. Spleen: Unremarkable. Adrenals/Urinary Tract: 2.0 x 1.5 cm fatty attenuation lesion in the left adrenal gland, compatible with a small adrenal myelolipoma. Right adrenal gland is normal. Mild atrophy of both kidneys. No suspicious appearing renal lesions. No hydroureteronephrosis. Urinary bladder is unremarkable in appearance. Stomach/Bowel: Normal appearance of the stomach. No pathologic dilatation of small bowel or colon. Normal appendix. Vascular/Lymphatic: Aortic atherosclerosis, with vascular findings and measurements pertinent to potential TAVR procedure, as detailed below. 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 Suboptimal contrast bolus on today's examination limits accurate assessment of other major abdominal vessels for potential hemodynamically significant stenosis, but there is considerable atherosclerotic disease in these vessels, particularly in the proximal celiac axis and superior mesenteric artery, both of which likely demonstrate at least moderate stenosis. No lymphadenopathy noted in the abdomen or pelvis. Reproductive: Prostate gland and seminal vesicles are unremarkable in appearance. Other: Trace volume of ascites.  No pneumoperitoneum. Musculoskeletal: There are no aggressive appearing lytic or blastic lesions noted in the visualized portions of the skeleton. VASCULAR MEASUREMENTS PERTINENT TO TAVR: AORTA: Minimal Aortic Diameter-10 x 13 mm Severity of Aortic Calcification-severe RIGHT PELVIS: Right Common Iliac Artery - Minimal Diameter-10.6 x 9.1 mm Tortuosity-mild Calcification-moderate to severe Right External Iliac Artery - Minimal Diameter-9.6 x 7.9 mm Tortuosity-mild Calcification-moderate Right Common Femoral Artery - Minimal Diameter-7.9 x  6.2 mm Tortuosity-mild Calcification-moderate LEFT PELVIS: Left Common Iliac Artery - Minimal Diameter-9.3 x 7.6 mm Tortuosity-mild Calcification-moderate to severe Left External Iliac Artery - Minimal Diameter-9.6 x 10.2 mm Tortuosity-mild Calcification-moderate Left Common Femoral Artery - Minimal Diameter-10.3 x 7.9 mm Tortuosity-mild Calcification-moderate Review of the MIP images confirms the above findings. IMPRESSION: 1. Vascular findings and measurements pertinent to potential TAVR procedure, as detailed above. 2. Severe thickening calcification of the aortic valve, compatible with the reported clinical history of severe aortic stenosis. 3. 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 4. Cardiomegaly with left ventricular hypertrophy. 5. 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 6. Aortic atherosclerosis, in addition to left main and 3 vessel coronary artery disease. Please note that although the presence of coronary artery calcium documents the presence of coronary artery disease, the severity of this disease and any potential stenosis cannot be assessed on this non-gated CT examination. Assessment for potential risk factor modification, dietary therapy or pharmacologic therapy may be warranted, if clinically indicated. 7. Additional incidental findings, as above. Aortic Atherosclerosis (ICD10-I70.0). Electronically Signed   By: Vinnie Langton M.D.   On: 07/31/2017 16:59      Subjective: He is in a lot pain , left leg   Discharge Exam: Vitals:   08/05/17 0835 08/05/17 1157  BP: 138/67 (!) 144/76  Pulse: 80 77  Resp: 18 20  Temp: 97.8 F (36.6 C) 98 F (36.7 C)  SpO2: 93% 95%   Vitals:   08/04/17 1734 08/04/17 2205 08/05/17 0835 08/05/17 1157  BP: (!) 143/95 (!) 144/69 138/67 (!) 144/76  Pulse: 81 67 80 77  Resp: _0 Temp: 98.9 F (37.2 C) 99.1 F (37.3 C) 97.8 F (36.6 C) 98 F (36.7 C)  TempSrc: Oral Oral Oral Oral  SpO2: 93% 92% 93% 95%  Weight:  96 kg  (211 lb 10.3 oz)    Height:        General: Pt is alert, awake, not in acute distress Cardiovascular: RRR, S1/S2 +, no rubs, no gallops Respiratory: CTA bilaterally, no wheezing, no rhonchi Abdominal: Soft, NT, ND, bowel sounds + Extremities: left leg with less redness, skin with necrosis. stabl;e    The results of significant diagnostics from this hospitalization (including imaging, microbiology, ancillary and laboratory) are listed below for reference.     Microbiology: Recent Results (from the past 240 hour(s))  MRSA PCR Screening     Status: None   Collection Time: 08/01/17 11:53 PM  Result Value Ref Range Status   MRSA by PCR NEGATIVE NEGATIVE Final    Comment:  The GeneXpert MRSA Assay (FDA approved for NASAL specimens only), is one component of a comprehensive MRSA colonization surveillance program. It is not intended to diagnose MRSA infection nor to guide or monitor treatment for MRSA infections. Performed at [facility], [place] [address] [place], [place] [address]      Labs: BNP (last 3 results) No results for input(s): BNP in the last 8760 hours. Basic Metabolic Panel: Recent Labs  Lab 08/01/17 0407 08/02/17 0628 08/03/17 0902 08/04/17 0656 08/05/17 0820  NA 134* 138 135 135 133*  K 4.5 4.0 5.2* 5.0 4.0  CL 95* 95* 94* 93* 93*  CO2 _0 20* 22  GLUCOSE 81 99 110* 62* 97  BUN 37* 22* 34* 42* 24*  CREATININE 6.86* 5.04* 6.62* 7.74* 5.66*  CALCIUM 8.8* 8.7* 9.1 9.1 9.4  PHOS 6.8* 4.0 5.4* 6.0* 5.0*   Liver Function Tests: Recent Labs  Lab 08/01/17 0407 08/02/17 0321 08/03/17 0902 08/04/17 0656 08/05/17 0820  ALBUMIN 2.5* 2.4* 2.5* 2.4* 2.5*   No results for input(s): LIPASE, AMYLASE in the last 168 hours. No results for input(s): AMMONIA in the last 168 hours. CBC: Recent Labs  Lab 08/01/17 0407 08/02/17 2248 08/03/17 0902 08/04/17 0656 08/05/17 0820  WBC 10.7* 10.5 10.6* 11.0* 11.5*  HGB 10.7* 11.1* 11.6* 10.8*  11.2*  HCT 34.9* 35.1* 37.3* 34.7* 35.9*  MCV 88.8 87.3 88.6 87.8 88.4  PLT 373 344 377 364 359   Cardiac Enzymes: No results for input(s): CKTOTAL, CKMB, CKMBINDEX, TROPONINI in the last 168 hours. BNP: Invalid input(s): POCBNP CBG: Recent Labs  Lab 08/01/17 2216 08/02/17 0736 08/03/17 0737 08/03/17 2028 08/05/17 0726  GLUCAP 102* 98 90 99 89   D-Dimer No results for input(s): DDIMER in the last 72 hours. Hgb A1c No results for input(s): HGBA1C in the last 72 hours. Lipid Profile No results for input(s): CHOL, HDL, LDLCALC, TRIG, CHOLHDL, LDLDIRECT in the last 72 hours. Thyroid function studies No results for input(s): TSH, T4TOTAL, T3FREE, THYROIDAB in the last 72 hours.  Invalid input(s): FREET3 Anemia work up No results for input(s): VITAMINB12, FOLATE, FERRITIN, TIBC, IRON, RETICCTPCT in the last 72 hours. Urinalysis    Component Value Date/Time   COLORURINE AMBER (A) 11/04/2015 1930   APPEARANCEUR TURBID (A) 11/04/2015 1930   LABSPEC 1.019 11/04/2015 1930   PHURINE 5.5 11/04/2015 1930   GLUCOSEU 100 (A) 11/04/2015 1930   HGBUR NEGATIVE 11/04/2015 1930   BILIRUBINUR NEGATIVE 11/04/2015 1930   KETONESUR NEGATIVE 11/04/2015 1930   PROTEINUR >300 (A) 11/04/2015 1930   UROBILINOGEN 0.2 09/22/2012 1805   NITRITE NEGATIVE 11/04/2015 1930   LEUKOCYTESUR NEGATIVE 11/04/2015 1930   Sepsis Labs Invalid input(s): PROCALCITONIN,  WBC,  LACTICIDVEN Microbiology Recent Results (from the past 240 hour(s))  MRSA PCR Screening     Status: None   Collection Time: 08/01/17 11:53 PM  Result Value Ref Range Status   MRSA by PCR NEGATIVE NEGATIVE Final    Comment:        The GeneXpert MRSA Assay (FDA approved for NASAL specimens only), is one component of a comprehensive MRSA colonization surveillance program. It is not intended to diagnose MRSA infection nor to guide or monitor treatment for MRSA infections. Performed at [facility], [place] [address],  [place], [place] [address]      Time coordinating discharge: Over 30 minutes  SIGNED:   Elmarie Shiley, MD  [name] 08/05/2017, 5:37 PM Pager   If 7PM-7AM, please contact  night-coverage [email or web address] Password [email or web address]

## 2017-08-05 NOTE — Progress Notes (Signed)
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 Pt presented to the ED 1/19 with left lower extremity redness and pain. Pt became encephalopic and MRI on 1/23 revealed multiple small supra-and infratentorial diffusion abnormalities most consistent with infarcts spanning multiple vascular territories. Pt now s/p RIGHT/LEFT HEART CATH AND CORONARY ANGIOGRAPHY on 1/30.    PT Comments    Patient seen for activity progression, some self limiting throughout session due to increased pain despite pre medication. Educated patient on importance of OOB activity however patient only agreeable to EOB exercises. Patient educated on HEP to perform in room. Had patient work towards placing LLE on ground, but patient reports extreme pain and is limited in ability to tolerate contact. At this time, will continue to see and progress as tolerated.    Follow Up Recommendations  CIR;Supervision/Assistance - 24 hour     Equipment Recommendations  None recommended by PT    Recommendations for Other Services       Precautions / Restrictions Precautions Precautions: Fall Restrictions Weight Bearing Restrictions: No RLE Weight Bearing: Weight bearing as tolerated    Mobility  Bed Mobility Overal bed mobility: Needs Assistance Bed Mobility: Supine to Sit;Rolling Rolling: Supervision   Supine to sit: Supervision;HOB elevated Sit to supine: HOB elevated;Mod assist   General bed mobility comments: able to perform supine to sit with [facility] elevate x3 during session without difficulty  Transfers                 General transfer comment: not agreeable to OOB at  this time  Ambulation/Gait                 Stairs            Wheelchair Mobility    Modified Rankin (Stroke Patients Only)       Balance Overall balance assessment: Needs assistance Sitting-balance support: Bilateral upper extremity supported Sitting balance-Leahy Scale: Good(tolerated dynamic sitting activity without issue)                                      Cognition Arousal/Alertness: Awake/alert Behavior During Therapy: Anxious Overall Cognitive Status: Within Functional Limits for tasks assessed                                        Exercises Other Exercises Other Exercises: patient performed LLE LAQs at EOB, limited range and limited by pain 2 sets 6 reps Other Exercises: patient performed modified due to limited ROM in semi reclined position in bed 2 sets x10 Other Exercises: ankle pumps LLE 2sets  x10 Other Exercises: PROM heel cord stretch LLE limited by pain with resisted dorsiflexion Other Exercises: AAROM heel cord stretch with sheet demonstrated and performed with patient x5 in bed    General Comments        Pertinent Vitals/Pain Pain Assessment: 0-10 Pain Score: 8  Pain Location: L LE Pain Descriptors / Indicators: Sore;Grimacing;Guarding;Moaning Pain Intervention(s): Premedicated before session;Repositioned;Relaxation    Home Living  Prior Function            PT Goals (current goals can now be found in the care plan section) Acute Rehab PT Goals Patient Stated Goal: None stated this session PT Goal Formulation: With patient/family Time For Goal Achievement: 08/05/17 Potential to Achieve Goals: Good Progress towards PT goals: Progressing toward goals    Frequency    Min 3X/week      PT Plan Current plan remains appropriate    Co-evaluation              AM-PAC PT "6 Clicks" Daily Activity  Outcome Measure  Difficulty turning over in bed (including  adjusting bedclothes, sheets and blankets)?: None Difficulty moving from lying on back to sitting on the side of the bed? : A Little Difficulty sitting down on and standing up from a chair with arms (e.g., wheelchair, bedside commode, etc,.)?: Unable Help needed moving to and from a bed to chair (including a wheelchair)?: A Lot Help needed walking in hospital room?: Total Help needed climbing 3-5 steps with a railing? : Total 6 Click Score: 12    End of Session   Activity Tolerance: Patient tolerated treatment well Patient left: in bed;with call bell/phone within reach;with family/visitor present Nurse Communication: Mobility status;Other (comment)(options fo rback to bed) PT Visit Diagnosis: Other abnormalities of gait and mobility (R26.89)     Time: 8206-0156 PT Time Calculation (min) (ACUTE ONLY): 16 min  Charges:  $Therapeutic Exercise: 8-22 mins                    G Codes:       Alben Deeds, PT DPT  Board Certified Neurologic Specialist [place] 08/05/2017, 11:45 AM
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
## 2017-08-05 NOTE — Consult Note (Signed)
[place] Nurse wound consult note Reason for Consult: Unstageable pressure injury Wound type: Unstageable pressure injury Pressure Injury POA: No Measurement: 5cm x 3cm x 0.1cm  Wound bed: 100% yellow slough Drainage (amount, consistency, odor) minimal, no odor Periwound: intact  Dressing procedure/placement/frequency: Add enzymatic debridement ointment to the buttock wound, top with saline moist gauze and dry dressing. Change daily. Add mattress overlay for pressure redistribution and moisture management  WOC Nurse team will follow along with you for weekly wound assessments.  Please notify me of any acute changes in the wounds or any new areas of concerns [place] MSN, RN,CWOCN, [place], [place]
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
## 2017-08-05 NOTE — H&P (Deleted)
  The note originally documented on this encounter has been moved the the encounter in which it belongs.  

## 2017-08-05 NOTE — Progress Notes (Signed)
Daily Progress Note   Patient Name: Hayden [name]       Date: 08/05/2017 DOB: 04/07/1971  Age: 47 y.o. MRN#: [id number] Attending Physician: Elmarie Shiley, MD Primary Care Physician: [name], DO Admit Date: 07/19/2017  Reason for Consultation/Follow-up: Establishing goals of care    Subjective: Met at the bedside with the patient, his spouse- Hayden [name], and mother in Sports coach, Hayden [name].  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 We discussed what function looks like to this patient and his family. Discussed their hopes for d/c to CIR with plans to return home. Palliative medicine outpatient was offered and explained.  Patient notes that [place] is to have level of functioning to be able to interact with family and leave his house "every once in a while". They are considering moving to [place] to make transporting to dialysis more tolerable.  Advance directives were discussed. Patient would not want to be long term on life support or have artificial nutrition.  We discussed code status. Due to his cardiac illness, advanced renal disease, calciphylaxis, patient chooses DNR status. He and his wife understand this to mean that if he does not have a pulse and is not breathing, he chooses not to be intubated, receive CPR or cardiac medications, or have code blue status called.  Pain management was discussed- he was previously on a long acting medication which better controlled his pain. Will transition him to oxycontin based on equilanalgesic dosing with Oxy IR for breakthrough. We  discussed the need for daily stool softener.  Patient and spouse request Palliative continue to follow for pain mgmt and continued GOC when patient is transferred to CIR.  Review of Systems  Respiratory: Negative.   Cardiovascular: Negative.   Skin:       LLE with large red area with quarter sized black area  Neurological: Positive for weakness.  Psychiatric/Behavioral: Positive for depression. Negative for suicidal ideas. The patient has insomnia.     Length of Stay: 16  Current Medications: Scheduled Meds:  . amLODipine  10 mg Oral Daily  . aspirin  81 mg Oral Daily  . atorvastatin  80  mg Oral q1800  . carvedilol  25 mg Oral BID WC  . cinacalcet  90 mg Oral Q breakfast  . cloNIDine  0.2 mg Transdermal Q Tue  . collagenase   Topical Daily  . divalproex  500 mg Oral Q12H  . escitalopram  10 mg Oral Daily  . ferric citrate  630 mg Oral TID WC  . gabapentin  100 mg Oral BID  . hydrALAZINE  100 mg Oral Q8H  . irbesartan  300 mg Oral Daily  . isosorbide dinitrate  10 mg Oral BID  . multivitamin  1 tablet Oral QHS  . nicotine  21 mg Transdermal Daily  . sevelamer carbonate  2,400 mg Oral TID WC  . sodium chloride flush  3 mL Intravenous Q12H  . warfarin  5 mg Oral ONCE-1800  . Warfarin - Pharmacist Dosing Inpatient   Does not apply q1800    Continuous Infusions: . sodium chloride    . sodium thiosulfate infusion for calciphylaxis      PRN Meds: sodium chloride, acetaminophen, camphor-menthol, hydrOXYzine, nitroGLYCERIN, oxyCODONE, senna-docusate, sevelamer carbonate, sodium chloride flush  Physical Exam  Constitutional: He is oriented to person, place, and time. He appears well-developed and well-nourished.  Cardiovascular: Normal rate.  Pulmonary/Chest: Effort normal.  Musculoskeletal:  R AKA  Neurological: He is alert and oriented to person, place, and time.  Skin: Skin is warm and dry.  Psychiatric: He has a normal mood and affect. His behavior is normal. Judgment  and thought content normal.  Nursing note and vitals reviewed.           Vital Signs: BP (!) 144/76 (BP Location: Right Arm)   Pulse 77   Temp 98 F (36.7 C) (Oral)   Resp 20   Ht 5' 10" (1.778 m)   Wt 96 kg (211 lb 10.3 oz)   SpO2 95%   BMI 30.37 kg/m  SpO2: SpO2: 95 % O2 Device: O2 Device: Not Delivered O2 Flow Rate: O2 Flow Rate (L/min): 2 L/min  Intake/output summary:   Intake/Output Summary (Last 24 hours) at 08/05/2017 1611 Last data filed at 08/05/2017 1152 Gross per 24 hour  Intake 620 ml  Output 0 ml  Net 620 ml   LBM: Last BM Date: 08/04/17 Baseline Weight: Weight: 105 kg (231 lb 7.7 oz) Most recent weight: Weight: 96 kg (211 lb 10.3 oz)       Palliative Assessment/Data: PPS: 50%   Flowsheet Rows     Most Recent Value  Intake Tab  Referral Department  Hospitalist  Unit at Time of Referral  Med/Surg Unit  Date Notified  08/03/17  Palliative Care Type  New Palliative care  Reason for referral  Clarify Goals of Care  Date of Admission  07/19/17  Date first seen by Palliative Care  08/04/17  # of days Palliative referral response time  1 Day(s)  # of days IP prior to Palliative referral  15  Clinical Assessment  Palliative Performance Scale Score  40%  Pain Max last 24 hours  Not able to report  Pain Min Last 24 hours  Not able to report  Dyspnea Max Last 24 Hours  Not able to report  Dyspnea Min Last 24 hours  Not able to report  Psychosocial & Spiritual Assessment  Palliative Care Outcomes  Patient/Family meeting held?  Yes  Who was at the meeting?  Patient in HD son at bedside  [place] goals of care, Provided psychosocial or spiritual support  Patient Active Problem List   Diagnosis Date Noted  . Palliative care encounter   . Goals of care, counseling/discussion   . 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 Severe aortic stenosis   . Cellulitis and abscess of left lower extremity 07/20/2017  . Hyperlipidemia with target low  density lipoprotein (LDL) cholesterol less than 70 mg/dL 06/03/2017  . Atrial flutter ([place]); CHA2DS2Vasc -5, on warfarin 05/09/2017  . Complete heart block ([place]) - s/p PPM 05/02/2017  . ESRD on hemodialysis ([place])   . Depression   . Phantom limb pain ([place])   . Hypertensive heart disease with chronic diastolic congestive heart failure ([place])   . Above knee amputation of right lower extremity ([place]) 12/04/2016  . Atherosclerosis of native arteries of the extremities with ulceration ([place]) 12/02/2016  . Neuropathic pain   . Type 2 diabetes mellitus with diabetic peripheral angiopathy and gangrene, without long-term current use of insulin ([place])   . Debility 11/22/2016  . S/P femoral-popliteal bypass surgery   . Tobacco abuse   . History of CVA (cerebrovascular accident)   . QT prolongation   . Gangrene of right foot ([place])   . End stage renal disease on dialysis ([place]) 06/19/2015  . GERD (gastroesophageal reflux disease) 06/19/2015  . Acute coronary syndrome ([place]) 09/22/2012  . History of partial Right nephrectomy for renal mass (2008) 09/22/2012  . Hypertensive urgency, malignant 09/21/2012    Palliative Care Assessment & Plan   Patient Profile:  47 y.o. male  with past medical history of stroke, seizure history, history of kidney cancer with right partial nephrectomy June 2018, end-stage renal disease on hemodialysis for 4-5 years, history of complete AV block with pacemaker, atrial flutter, increased blood pressure and cholesterol, right above-the-knee amputation approximately 18 months ago admitted on 07/19/2017 with cellulitis complication of calciphylaxis of left lower extremity. Palliative medicine consulted for [place].    Assessment/Recommendations/Plan   D/C to CIR, then D/C home with Palliative Care through [facility] for continued pain management and GOC  DNR  20 mg Oxycontin BID for long acting pain control  21m oxy IR q 4hr prn for breakthrough  pain  Patient would benefit from continued Palliative assistance for pain management and continued GOC at CIR- please refer once patient is admitted if assistance is desired  Goals of Care and Additional Recommendations:  Limitations on Scope of Treatment: No Artificial Feeding and No Surgical Procedures  Code Status:  DNR  Prognosis:   Unable to determine - likely limited due to calciphylaxis  Discharge Planning:  CIR, then home with Palliative  Care plan was discussed with patient and spouse.   Thank you for allowing the Palliative Medicine Team to assist in the care of this patient.   Time In: 1500 Time Out: 1640 Total Time 100 mins Prolonged Time Billed Yes      Greater than 50%  of this time was spent counseling and coordinating care related to the above assessment and plan.  KMariana Kaufman AGNP-C Palliative Medicine   Please contact Palliative Medicine Team phone at [phone] questions and concerns.

## 2017-08-06 ENCOUNTER — Inpatient Hospital Stay (HOSPITAL_COMMUNITY): Payer: Self-pay | Admitting: Occupational Therapy

## 2017-08-06 ENCOUNTER — Encounter: Payer: Self-pay | Admitting: Cardiology

## 2017-08-06 ENCOUNTER — Inpatient Hospital Stay (HOSPITAL_COMMUNITY): Payer: Medicare Other | Admitting: Speech Pathology

## 2017-08-06 ENCOUNTER — Inpatient Hospital Stay (HOSPITAL_COMMUNITY): Payer: Medicare Other

## 2017-08-06 LAB — CBC
HEMATOCRIT: 35.7 % — AB (ref 39.0–52.0)
Hemoglobin: 11.1 g/dL — ABNORMAL LOW (ref 13.0–17.0)
MCH: 27.7 pg (ref 26.0–34.0)
MCHC: 31.1 g/dL (ref 30.0–36.0)
MCV: 89 fL (ref 78.0–100.0)
Platelets: 367 10*3/uL (ref 150–400)
RBC: 4.01 MIL/uL — ABNORMAL LOW (ref 4.22–5.81)
RDW: 18.3 % — AB (ref 11.5–15.5)
WBC: 10.9 10*3/uL — AB (ref 4.0–10.5)

## 2017-08-06 LAB — RENAL FUNCTION PANEL
ALBUMIN: 2.6 g/dL — AB (ref 3.5–5.0)
ANION GAP: 18 — AB (ref 5–15)
BUN: 38 mg/dL — AB (ref 6–20)
CHLORIDE: 91 mmol/L — AB (ref 101–111)
CO2: 24 mmol/L (ref 22–32)
Calcium: 9.7 mg/dL (ref 8.9–10.3)
Creatinine, Ser: 7.51 mg/dL — ABNORMAL HIGH (ref 0.61–1.24)
GFR, EST AFRICAN AMERICAN: 9 mL/min — AB (ref >60)
GFR, EST NON AFRICAN AMERICAN: 8 mL/min — AB (ref >60)
Glucose, Bld: 87 mg/dL (ref 65–99)
PHOSPHORUS: 6.2 mg/dL — AB (ref 2.5–4.6)
POTASSIUM: 4.8 mmol/L (ref 3.5–5.1)
Sodium: 133 mmol/L — ABNORMAL LOW (ref 135–145)

## 2017-08-06 LAB — PROTIME-INR
INR: 2.16
PROTHROMBIN TIME: 23.9 s — AB (ref 11.4–15.2)

## 2017-08-06 LAB — GLUCOSE, CAPILLARY
GLUCOSE-CAPILLARY: 108 mg/dL — AB (ref 65–99)
Glucose-Capillary: 75 mg/dL (ref 65–99)

## 2017-08-06 MED ORDER — DIPHENHYDRAMINE HCL 12.5 MG/5ML PO ELIX
12.5000 mg | ORAL_SOLUTION | Freq: Four times a day (QID) | ORAL | Status: DC | PRN
  Administered 2017-08-07: 12.5 mg via ORAL
  Administered 2017-08-08: 25 mg via ORAL
  Filled 2017-08-06: qty 0
  Filled 2017-08-06: qty 10

## 2017-08-06 MED ORDER — HEPARIN SODIUM (PORCINE) 1000 UNIT/ML DIALYSIS
2400.0000 [IU] | Freq: Once | INTRAMUSCULAR | Status: DC
  Filled 2017-08-06: qty 3

## 2017-08-06 NOTE — Progress Notes (Addendum)
[facility] Progress Note  Subjective: L leg pain persists  Vitals:   08/05/17 2217 08/06/17 0219  BP: 138/74 (!) 149/72  Pulse:  80  Resp:  16  Temp:  99.3 F (37.4 C)  TempSrc:  Axillary  SpO2:  90%  Weight:  96 kg (211 lb 10.3 oz)    Inpatient medications: . acetaminophen  650 mg Oral TID WC & HS  . amLODipine  10 mg Oral Daily  . aspirin  81 mg Oral Daily  . atorvastatin  80 mg Oral q1800  . carvedilol  25 mg Oral BID WC  . cinacalcet  90 mg Oral Q breakfast  . [START ON 08/12/2017] cloNIDine  0.2 mg Transdermal Q Tue  . collagenase   Topical Daily  . divalproex  500 mg Oral Q12H  . escitalopram  10 mg Oral Daily  . ferric citrate  630 mg Oral TID WC  . gabapentin  100 mg Oral BID  . hydrALAZINE  100 mg Oral Q8H  . irbesartan  300 mg Oral Daily  . isosorbide dinitrate  10 mg Oral BID  . lidocaine   Topical QID  . multivitamin  1 tablet Oral QHS  . nicotine  21 mg Transdermal Daily  . sevelamer carbonate  2,400 mg Oral TID WC  . sodium chloride flush  3 mL Intravenous Q12H   . 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 LUA AVF NF, ox3  Dialysis: MWF [facility] 4h  98kg (lower at dc) 2/2.25 bath  LUA AVF   Hep 2400 - Hectoral 23mg IV q HD(D/c'd) - Venofer '100mg'$  x 10 ordered (4 given) - Mircera 2282m IV q 2 weeks (last 1/18) - Na thiosulfate 25 gm QHD started this hospitalization -home BP-norvasc 10/ coreg 25 bid/ clon patch 0.2 / hydral 100 tid/ avapro 300      Impression: 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 3   CM/ severe AS, not surg candidate 4  CVA mult, small 5  Aflutter, will chg coumadin to eliquis ,renal dose per pharm (for #1) 6  HTN mult meds, bp ok 7  Vol under dry 8  AMS resolved 9  PVD 10  DM2 11  Bipolar 12  Anemia ckd, Hb good, will dc Fe load d/t #1 13  CAD sp cath 2V CAD, med Rx 14  EOL - met w pall care, DNR now  Plan - HD today , Na thio, eliquis.     [facility] pager [phone] 08/06/2017, 12:02 PM   Recent Labs  Lab 08/03/17 0902 08/04/17 0656 08/05/17 0820  NA 135 135 133*  K 5.2* 5.0 4.0  CL 94* 93* 93*  CO2 22 20* 22  GLUCOSE 110* 62* 97  BUN 34* 42* 24*  CREATININE 6.62* 7.74* 5.66*  CALCIUM 9.1 9.1 9.4  PHOS 5.4* 6.0* 5.0*   Recent Labs  Lab 08/03/17 0902 08/04/17 0656 08/05/17 0820  ALBUMIN 2.5* 2.4* 2.5*   Recent Labs  Lab 08/03/17 0902 08/04/17 0656 08/05/17 0820  WBC 10.6* 11.0* 11.5*  HGB 11.6* 10.8* 11.2*  HCT 37.3* 34.7* 35.9*  MCV 88.6 87.8 88.4  PLT 377 364  359   Iron/TIBC/Ferritin/ %Sat    Component Value Date/Time   IRON 17 (L) 07/24/2017 0758   TIBC 197 (L) 07/24/2017 0758   FERRITIN 1,427 (H) 07/24/2017 0758   IRONPCTSAT 9 (L) 07/24/2017 8315
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
## 2017-08-06 NOTE — Progress Notes (Signed)
[facility]     PROGRESS NOTE    Subjective/Complaints: Pain is an ongoing issue. Ranging from a 6-9/10. 7 right now. Worse with activity. Pain exclusively in left leg  ROS: pt denies nausea, vomiting, diarrhea, cough, shortness of breath or chest pain   Objective: Vital Signs: Blood pressure (!) 149/72, pulse 80, temperature 99.3 F (37.4 C), temperature source Axillary, resp. rate 16, weight 96 kg (211 lb 10.3 oz), SpO2 90 %. No results found. Recent Labs    08/04/17 0656 08/05/17 0820  WBC 11.0* 11.5*  HGB 10.8* 11.2*  HCT 34.7* 35.9*  PLT 364 359   Recent Labs    08/04/17 0656 08/05/17 0820  NA 135 133*  K 5.0 4.0  CL 93* 93*  GLUCOSE 62* 97  BUN 42* 24*  CREATININE 7.74* 5.66*  CALCIUM 9.1 9.4   CBG (last 3)  Recent Labs    08/03/17 2028 08/05/17 0726  GLUCAP 99 89    Wt Readings from Last 3 Encounters:  08/06/17 96 kg (211 lb 10.3 oz)  08/04/17 96 kg (211 lb 10.3 oz)  06/03/17 96.6 kg (213 lb)    Physical Exam:  Constitutional: He appears well-developed and well-nourished.  HENT:  Head: Normocephalic.  Eyes: Pupils are equal, round, and reactive to light.  Neck: Normal range of motion.  Cardiovascular: RRR with systolic murmur Respiratory: normal effort GI: Soft. He exhibits no distension. There is no tenderness.  Musculoskeletal: He exhibits tr edema.  Neurological: No cranial nerve deficit.  Bilateral UE's 4/5. RLE 4-/5 HF. LLE 3/5 with limitations due to pain. Limited insight and awareness.  Skin:  Tattoos, LLE with erythema, eschar around healing wound, some debris on pillow  Psychiatric:  Flat but cooperative     Assessment/Plan: 1. 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 Physiatrist is providing close team supervision and 24 hour management of active medical problems listed  below. Physiatrist and rehab team continue to assess barriers to discharge/monitor patient progress toward functional and medical goals.  Function:  Bathing Bathing position      Bathing parts      Bathing assist        Upper Body Dressing/Undressing Upper body dressing                    Upper body assist        Lower Body Dressing/Undressing Lower body dressing                                  Lower body assist        Toileting Toileting          Toileting assist     Transfers Chair/bed transfer             Locomotion Ambulation           Wheelchair          Cognition Comprehension Comprehension assist level: Understands basic 75 - 89% of the time/ requires cueing 10 - 24% of the time  Expression Expression assist level: Expresses basic 75 - 89% of the time/requires cueing 10 - 24% of the time. Needs helper to occlude trach/needs to repeat words.  Social Interaction Social Interaction assist level: Interacts appropriately 75 - 89% of the time - Needs redirection for appropriate language or to  initiate interaction.  Problem Solving Problem solving assist level: Solves basic 75 - 89% of the time/requires cueing 10 - 24% of the time  Memory Memory assist level: Recognizes or recalls 75 - 89% of the time/requires cueing 10 - 24% of the time   Medical Problem List and Plan:  1. Functional and mobility deficits secondary to debility and cardio-embolic CVA/encephalopathy  -beginning therapies today  2. Anticoagulation: Pharmaceutical: Coumadin therapeutic. Should stay on medication for stroke prophylaxis  3. Chronic back pain/Pain Management: Will schedule Oxycodone prior to therapy and prn during the day with gabapentin bid.      -consider long acting agent as well but need to be careful re: neuro-sedating effects 4. Bipolar disorder/Mood: continue Lexapro and depakote. LCSW to follow for evaluation and support.  5. Neuropsych: This  patient is not capable of making decisions on his own behalf.  6. Skin/Wound Care: recent bx with vasculitis c/w calciphylaxis  -coumadin had been on hold  -renvela/thiosulfate with HDper renal recs  -pain mgt  -cover left leg daily with non-adherent dressing 7. Fluids/Electrolytes/Nutrition: strict I/O. Renal diet with 1200 cc FR/day. Labs reviewed 8. CAD/Systolic CHF with severe AS, MS/a-flutter: deemed non-operative candidate  -Treated medically with ASA, Lipitor, Coreg and Isordil  -daily weights, voliume mgt  -palliative care consult for goals of care was reviewed  9. ESRD: Continue HD MWF. Schedule at end of the day to help with activity tolerance.  10. HTN: Monitor BID. Continue Apresoline, Avapro, Norvasc, Coreg, Catapres and Isordil.  11. Tobacco abuse: Continue nicotine patch.  12 . H/o Seizure disorder: On Depakote.  13. Neuropathy: continue gabapentin bid.  14. Anemia of chronic disease: Stable.  15. T2DM: Diet controlled. Continue to monitor BS ac/hs. Use SSI for elevated BS.    LOS (Days) 1 A Gardner T, MD 08/06/2017 8:33 AM
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
## 2017-08-06 NOTE — Progress Notes (Signed)
Patients family member alerted that patient was bleeding. When noted, patient had scratched scratch mark areas to the left neck region & the scratches had bled. Cleansed the areas with normal saline, but they were still bleeding. Ice was applied. Patient asked to take it off after 5 minutes. He took it off & it was noted that he was still bleeding. Patient instructed to reapply & hold on the area for a few minutes. Patients family stated that he has issues with itching & was given a medicine last night that helped. The medication, Atarax, was no longer on his [facility] & he was informed that he had an order for benadryl. He stated, "I don't take benadryl" & did not want it. He also has an order for sarna lotion, which is in his room. Will continue to monitor.
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
## 2017-08-06 NOTE — Progress Notes (Signed)
Patient's family member was anxious about the time that patient will receive his pain medication. He was given his prn oxycodone by the previous nurse at [facility]. He has 2 orders for oxycodone 10mg , one that is to be given at breakfast & lunch and another that is every 4 hours prn. This nurse went in to give him his dose after midnight, but he was asleep. His family member wanted the nurse to come back at 1pm to check to see if he was asleep, because she did not want him to miss his dose. Informed her that the dose was a prn dose, but she did not want to wake him up. She asked for the nurse then to come back at 2pm. The family member called stating that it was after 2am & the nurse was going to give the medication, but told the tech that the patient was still asleep & was starting to scratch. This nurse went down to his room & called the patient, he did not answer until the family called him another name. When the nurse spoke to the patient he seemed to paused as if he did not hear the nurse even after that. He stated that his pain was a 7, but was not showing any other signs. Medication was given & nurse tech took his vitals & freshened up his bed. Will continue to monitor. A communication was placed for clarification of Oxycodone orders. Family stated that she was told that he would be on a long acting pain med & have the oxycodone as breakthrough. He is palliative care. Will pass on to on-coming nurse as well.

## 2017-08-07 ENCOUNTER — Ambulatory Visit: Payer: Medicare Other | Admitting: Cardiology

## 2017-08-07 ENCOUNTER — Inpatient Hospital Stay (HOSPITAL_COMMUNITY): Payer: Medicare Other | Admitting: Speech Pathology

## 2017-08-07 ENCOUNTER — Inpatient Hospital Stay (HOSPITAL_COMMUNITY): Payer: Self-pay

## 2017-08-07 ENCOUNTER — Inpatient Hospital Stay (HOSPITAL_COMMUNITY): Payer: Self-pay | Admitting: Physical Therapy

## 2017-08-07 LAB — GLUCOSE, CAPILLARY: Glucose-Capillary: 87 mg/dL (ref 65–99)

## 2017-08-07 LAB — PROTIME-INR
INR: 1.88
Prothrombin Time: 21.4 seconds — ABNORMAL HIGH (ref 11.4–15.2)

## 2017-08-07 MED ORDER — APIXABAN 5 MG PO TABS
5.0000 mg | ORAL_TABLET | Freq: Two times a day (BID) | ORAL | Status: DC
  Administered 2017-08-07 – 2017-08-16 (×19): 5 mg via ORAL
  Filled 2017-08-07 (×19): qty 1

## 2017-08-07 MED ORDER — OXYCODONE HCL ER 10 MG PO T12A
10.0000 mg | EXTENDED_RELEASE_TABLET | Freq: Two times a day (BID) | ORAL | Status: DC
  Administered 2017-08-07 – 2017-08-13 (×13): 10 mg via ORAL
  Filled 2017-08-07 (×13): qty 1

## 2017-08-07 NOTE — Progress Notes (Signed)
Speech Language Pathology Daily Session Note  Patient Details  Name: Hayden [name] MRN: [id number] Date of Birth: Aug 20, 1970  Today's Date: 08/07/2017 SLP Individual Time: 1500-1530 SLP Individual Time Calculation (min): 30 min  Short Term Goals: Week 1: SLP Short Term Goal 1 (Week 1): Pt will utilize external memory aids to recall new daily information with supervision cues.  SLP Short Term Goal 2 (Week 1): Pt will demonstrate selective attention to basic famailiar tasks for ~ 45 minutes with supervision cues.  SLP Short Term Goal 3 (Week 1): Pt will demonstrate anticipatory awareness but listing 3activities that are safe to participate in within home environment with supervision cues.   Skilled Therapeutic Interventions: Skilled treatment session focused on cognition goals. SLP received pt upright in wheelchair with family present. Pt states that he is very fatigued and SLP questioned possibility of overmedication as pt's fatigue fluctuated throughout session when fatigue generally presents itself as consistently present throughout session. SLP attempted [facility] again but pt unable to complete d/t internal distraction of fatigue, complants of his mother-in-law and overall disinterested in task. Pt's inconsistent fatigue and comments make it difficult to provide differential diagnosis of cognitive impairments vs medication vs fatigue. Will give compensatory strategies and information to family on how to cope with fluctuating abilities and then discharge from [place] services.      Function:    Cognition Comprehension Comprehension assist level: Understands basic 90% of the time/cues < 10% of the time;Follows basic conversation/direction with extra time/assistive device  Expression   Expression assist level: Expresses basic needs/ideas: With no assist  Social Interaction Social Interaction assist level: Interacts appropriately 90% of the time - Needs monitoring or encouragement for participation or  interaction.  Problem Solving Problem solving assist level: Solves basic 90% of the time/requires cueing < 10% of the time;Solves basic problems with no assist  Memory Memory assist level: Recognizes or recalls 90% of the time/requires cueing < 10% of the time;Recognizes or recalls 75 - 89% of the time/requires cueing 10 - 24% of the time    Pain Pain Assessment Pain Assessment: 0-10 Pain Score: 8  Pain Type: Chronic pain Pain Location: Leg Pain Orientation: Left Pain Onset: On-going Pain Intervention(s): Medication (See eMAR)  Therapy/Group: Individual Therapy  Amore Grater 08/07/2017, 3:37 PM

## 2017-08-07 NOTE — Progress Notes (Signed)
Patient vomited after 1800 med administration. Unsure which pills removed

## 2017-08-07 NOTE — Progress Notes (Signed)
Occupational Therapy Note  Patient Details  Name: Hayden [name] MRN: [id number] Date of Birth: 12-07-1970  Today's Date: 08/07/2017 OT Individual Time: 1400-1425 OT Individual Time Calculation (min): 25 min   Pt denies pain Individual Therapy  Pt resting in w/c upon arrival with family present.  OT intervention with focus on discharge planning and pt's goals regarding toileting and bathing.  Bathroom is w/c accessible and pt able to scoot over to toilet prior to this hospital admission.  Pt was scooting to edge of tub from toilet and lowering into bathtub.  Pt's son would lift pt out of tub after bathing.  Discussed risks with pt, son, and mother-in-law.  Recommeded tub bench or shower seat for use in walk in shower if able to access.  Demonstrated tub bench and shower seat.  Pt and/or mother-in-law will discuss with pt's wife and notify staff of decision.  Pt liked idea of bathing on seat.  Pt hesitant to attempt shower while in hospital but will consider later during hospital stay.  Pt agreeable to remaining in w/c until end of next therapy.  Pt remained in w/c with all needs within reach and family present.    Hayden [name] [facility] 08/07/2017, 2:48 PM

## 2017-08-07 NOTE — Progress Notes (Signed)
Patient refused miralax but agreed to drink warm prune juice
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
## 2017-08-07 NOTE — Progress Notes (Signed)
Physical Therapy Session Note  Patient Details  Name: Hayden [name] MRN: [id number] Date of Birth: [birth year]  Today's Date: 08/07/2017 PT Individual Time: 1300-1400 PT Individual Time Calculation (min): 60 min   Short Term Goals: Week 1:  PT Short Term Goal 1 (Week 1): pt will perform lateral scoot transfer with supervision after set-up with slide board PT Short Term Goal 2 (Week 1): pt will propel w/c x 150' with supervision PT Short Term Goal 3 (Week 1): pt will tolerate wt bearing through L foot during assisted sit> stand, using Stedy PRN  Skilled Therapeutic Interventions/Progress Updates: Pt received seated in bed, c/o pain 7/10 and RN present during session to dress LLE and administer medication. Performed LLE strengthening exercises including resisted plantarflexion with theraband and straight leg raises. Supine>sit with S and increased time, bedrails. Lateral scoot transfer with transfer board to w/c with mod cues for technique and setup for correct placement of board. W/c propulsion 2x100-150' per trial BUE for strengthening and endurance; provided pt with w/c gloves to increase efficiency of propulsion and protect hands. Performed car transfer with slight uphill to car using transfer board and min guard; mod cues for technique and safety as pt attempting to reach RUE to unstable door. Utilized SmartCheck to [employer] cushion inflation level; educated pt on proper inflation to reduce risk of further skin breakdown. Returned to room as above; remained seated in w/c at end of session, family present and all needs in reach.      Therapy Documentation Precautions:  Precautions Precautions: Fall Precaution Comments: pain in left LE Restrictions Weight Bearing Restrictions: No RLE Weight Bearing: Weight bearing as tolerated   See Function Navigator for Current Functional Status.   Therapy/Group: Individual Therapy  Luberta Mutter 08/07/2017, 2:05 PM

## 2017-08-07 NOTE — Progress Notes (Signed)
[facility] PHYSICAL MEDICINE & REHABILITATION     PROGRESS NOTE    Subjective/Complaints: Pain kept him awake at night. Using meds q4. Leg not wrapped  ROS: pt denies nausea, vomiting, diarrhea, cough, shortness of breath or chest pain    Objective: Vital Signs: Blood pressure (!) 167/95, pulse 80, temperature 97.8 F (36.6 C), temperature source Oral, resp. rate 18, weight 94.1 kg (207 lb 7.3 oz), SpO2 94 %. No results found. Recent Labs    08/05/17 0820 08/06/17 1630  WBC 11.5* 10.9*  HGB 11.2* 11.1*  HCT 35.9* 35.7*  PLT 359 367   Recent Labs    08/05/17 0820 08/06/17 1630  NA 133* 133*  K 4.0 4.8  CL 93* 91*  GLUCOSE 97 87  BUN 24* 38*  CREATININE 5.66* 7.51*  CALCIUM 9.4 9.7   CBG (last 3)  Recent Labs    08/06/17 0636 08/06/17 1147 08/07/17 0652  GLUCAP 75 108* 87    Wt Readings from Last 3 Encounters:  08/06/17 94.1 kg (207 lb 7.3 oz)  08/04/17 96 kg (211 lb 10.3 oz)  06/03/17 96.6 kg (213 lb)    Physical Exam:  Constitutional: He appears well-developed and well-nourished.  HENT:  Head: Normocephalic.  Eyes: Pupils are equal, round, and reactive to light.  Neck: Normal range of motion.  Cardiovascular: RRR without murmur. No JVD  Chest: CTA B GI: Soft. He exhibits no distension. There is no tenderness.  Musculoskeletal: He exhibits tr edema.  Neurological: No cranial nerve deficit.  Bilateral UE's 4/5. RLE 4-/5 HF. LLE 3/5 with limitations due to pain. Limited insight and awareness.  Skin:  Tattoos, LLE with erythema, eschar around healing wound---stable Psychiatric:  Flat but cooperative     Assessment/Plan: 1. 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 Physiatrist is providing close team supervision and 24 hour management of active medical problems listed below. Physiatrist and rehab team continue to assess barriers to  discharge/monitor patient progress toward functional and medical goals.  Function:  Bathing Bathing position   Position: Bed  Bathing parts Body parts bathed by patient: Right arm, Left arm, Chest, Abdomen, Front perineal area, Right upper leg, Left upper leg Body parts bathed by helper: Buttocks, Back  Bathing assist        Upper Body Dressing/Undressing Upper body dressing   What is the patient wearing?: Pull over shirt/dress     Pull over shirt/dress - Perfomed by patient: Thread/unthread right sleeve, Thread/unthread left sleeve, Put head through opening, Pull shirt over trunk          Upper body assist Assist Level: Supervision or verbal cues      Lower Body Dressing/Undressing Lower body dressing   What is the patient wearing?: Pants       Pants- Performed by helper: Thread/unthread right pants leg, Thread/unthread left pants leg, Pull pants up/down                      Lower body assist Assist for lower body dressing: Supervision or verbal cues      Toileting Toileting Toileting activity did not occur: No continent bowel/bladder event        Toileting assist     Transfers Chair/bed transfer   Chair/bed transfer method: Lateral scoot Chair/bed transfer assist level: Touching or steadying assistance (Pt > 75%) Chair/bed transfer assistive device: Sliding board     Locomotion Ambulation  Ambulation activity did not occur: Safety/medical concerns(unable to tolerate wt bearing LLE)         Wheelchair   Type: Manual Max wheelchair distance: 150 Assist Level: Touching or steadying assistance (Pt > 75%)  Cognition Comprehension Comprehension assist level: Understands basic 90% of the time/cues < 10% of the time  Expression Expression assist level: Expresses basic 90% of the time/requires cueing < 10% of the time.  Social Interaction Social Interaction assist level: Interacts appropriately 75 - 89% of the time - Needs redirection for appropriate  language or to initiate interaction.  Problem Solving Problem solving assist level: Solves basic 75 - 89% of the time/requires cueing 10 - 24% of the time  Memory Memory assist level: Recognizes or recalls 75 - 89% of the time/requires cueing 10 - 24% of the time   Medical Problem List and Plan:  1. Functional and mobility deficits secondary to debility and cardio-embolic CVA/encephalopathy  -continue therapies 2. Anticoagulation: Pharmaceutical: Coumadin therapeutic. Should stay on medication for stroke prophylaxis  3. Chronic back pain/Pain Management: Will schedule Oxycodone prior to therapy and prn during the day with gabapentin bid.      -begin oxy CR 10mg  q 12--observing closely for tolerance 4. Bipolar disorder/Mood: continue Lexapro and depakote. LCSW to follow for evaluation and support.  5. Neuropsych: This patient is not capable of making decisions on his own behalf.  6. Skin/Wound Care: recent bx with vasculitis c/w calciphylaxis  -coumadin had been on hold  -renvela/thiosulfate with HDper renal recs  -pain mgt  -cover left leg daily with non-adherent dressing 7. Fluids/Electrolytes/Nutrition: strict I/O. Renal diet with 1200 cc FR/day. Labs reviewed 8. CAD/Systolic CHF with severe AS, MS/a-flutter: deemed non-operative candidate  -Treated medically with ASA, Lipitor, Coreg and Isordil  -daily weights, volume mgt  -palliative care consult for goals of care was reviewed  9. ESRD: Continue HD MWF. Scheduled at end of the day to help with activity tolerance.  10. HTN: Monitor BID. Continue Apresoline, Avapro, Norvasc, Coreg, Catapres and Isordil.  11. Tobacco abuse: Continue nicotine patch.  12 . H/o Seizure disorder: On Depakote.  13. Neuropathy: continue gabapentin bid.  14. Anemia of chronic disease: Stable.  15. T2DM: Diet controlled at present    -Continue to monitor BS ac/hs. Use SSI for elevated BS.    LOS (Days) 2 A [place] T, MD 08/07/2017 8:13 AM

## 2017-08-07 NOTE — Progress Notes (Signed)
Occupational Therapy Session Note  Patient Details  Name: KAHLIN MARK MRN: [id number] Date of Birth: [birth year]  Today's Date: 08/07/2017 OT Individual Time: 6644-0347 OT Individual Time Calculation (min): 73 min    Short Term Goals: Week 1:  OT Short Term Goal 1 (Week 1): Pt will transfer to BSC/ toilet with min A  OT Short Term Goal 2 (Week 1): Pt will thread LB clothing with setup  OT Short Term Goal 3 (Week 1): Pt will tolerate left LE being dependent in prep for transfer/ ADL task OT Short Term Goal 4 (Week 1): Pt will perform bed mobility with mod I in prep for ADL tasks   Skilled Therapeutic Interventions/Progress Updates:    Pt resting in bed upon arrival.  Blood spots noted on sheet and pillow cases and pt stated that was because he "scratched a lot." RN aware and linens changed.  Pt noted with scratch on L cheek near ear which was bleeding.  RN notified.  Pt engaged in bathing/dressing tasks at bed level, declining sitting EOB and placing LLE in dependent position.  Pt states his LLE throbs when placed in dependent position.  Pt completed bathing dressing tasks at bed level without assistance.  Pt rolls in bed with use of bed rails.  Pt transitioned to BUE therex with green Theraband and 2kg weight ball.  OT intervention with focus on BUE therex, BADL retraiing, bed mobility, and activity tolerance, to increase independence with BADLs.   Therapy Documentation Precautions:  Precautions Precautions: Fall Precaution Comments: pain in left LE Restrictions Weight Bearing Restrictions: No RLE Weight Bearing: Weight bearing as tolerated Pain: Pain Assessment Pain Assessment: 0-10 Pain Score: 8  Pain Type: Chronic pain Pain Location: Leg Pain Orientation: Left Pain Descriptors / Indicators: Burning Pain Frequency: Constant Pain Onset: On-going Pain Intervention(s): Medication (See eMAR) 2nd Pain Site Pain Score: 9 Pain Type: Chronic pain Pain Location: Buttocks Pain  Orientation: Mid Pain Descriptors / Indicators: Burning Pain Frequency: Constant Pain Onset: On-going Pain Intervention(s): Medication (See eMAR)  See Function Navigator for Current Functional Status.   Therapy/Group: Individual Therapy  Leroy Libman 08/07/2017, 11:29 AM

## 2017-08-07 NOTE — Progress Notes (Signed)
[facility] Progress Note  Subjective: L leg pain persists  Vitals:   08/06/17 1845 08/06/17 1850 08/07/17 0500 08/07/17 0911  BP: (!) 153/58 (!) 142/44 (!) 167/95 (!) 168/81  Pulse: 80 84 80   Resp:  18 18   Temp:  98.4 F (36.9 C) 97.8 F (36.6 C)   TempSrc:  Oral Oral   SpO2:  100% 94%   Weight:  94.1 kg (207 lb 7.3 oz)      Inpatient medications: . acetaminophen  650 mg Oral TID WC & HS  . amLODipine  10 mg Oral Daily  . apixaban  5 mg Oral BID  . aspirin  81 mg Oral Daily  . atorvastatin  80 mg Oral q1800  . carvedilol  25 mg Oral BID WC  . cinacalcet  90 mg Oral Q breakfast  . [START ON 08/12/2017] cloNIDine  0.2 mg Transdermal Q Tue  . collagenase   Topical Daily  . divalproex  500 mg Oral Q12H  . escitalopram  10 mg Oral Daily  . ferric citrate  630 mg Oral TID WC  . gabapentin  100 mg Oral BID  . heparin  2,400 Units Dialysis Once in dialysis  . hydrALAZINE  100 mg Oral Q8H  . irbesartan  300 mg Oral Daily  . isosorbide dinitrate  10 mg Oral BID  . lidocaine   Topical QID  . multivitamin  1 tablet Oral QHS  . nicotine  21 mg Transdermal Daily  . oxyCODONE  10 mg Oral Q12H  . sevelamer carbonate  2,400 mg Oral TID WC  . sodium chloride flush  3 mL Intravenous Q12H   . 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 LUA AVF NF, ox3  Dialysis: MWF [facility] 4h  98kg (lower at dc) 2/2.25 bath  LUA AVF   Hep 2400 - Hectoral 21mcg IV q HD(D/c'd) - Venofer 100mg  x 10 ordered (4 given) - Mircera 277mcg IV q 2 weeks (last  1/18) - Na thiosulfate 25 gm QHD started this hospitalization -home BP-norvasc 10/ coreg 25 bid/ clon patch 0.2 / hydral 100 tid/ avapro 300      Impression: 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 3  CM/ severe AS, not surg candidate 4  CVA mult, small 5  Aflutter, changing to eliquis ,renal dose per pharm (for #1) 6  HTN mult meds, bp ok 7  Vol under dry 8  AMS resolved 9  PVD 10  DM2 11  Bipolar 12  Anemia ckd, Hb good, will dc Fe load d/t #1 13  CAD sp cath 2V CAD, med Rx 14  EOL - DNR now  Plan - HD Friday, na thio, eliquis    Kelly Splinter MD [facility] pager [phone]   08/07/2017, 12:46 PM   Recent Labs  Lab 08/04/17 0656 08/05/17 0820 08/06/17 1630  NA 135 133* 133*  K 5.0 4.0 4.8  CL 93* 93* 91*  CO2 20* 22 24  GLUCOSE 62* 97 87  BUN 42* 24* 38*  CREATININE 7.74* 5.66* 7.51*  CALCIUM 9.1 9.4 9.7  PHOS 6.0* 5.0* 6.2*   Recent Labs  Lab 08/04/17 0656 08/05/17 0820 08/06/17  1630  ALBUMIN 2.4* 2.5* 2.6*   Recent Labs  Lab 08/04/17 0656 08/05/17 0820 08/06/17 1630  WBC 11.0* 11.5* 10.9*  HGB 10.8* 11.2* 11.1*  HCT 34.7* 35.9* 35.7*  MCV 87.8 88.4 89.0  PLT 364 359 367   Iron/TIBC/Ferritin/ %Sat    Component Value Date/Time   IRON 17 (L) 07/24/2017 0758   TIBC 197 (L) 07/24/2017 0758   FERRITIN 1,427 (H) 07/24/2017 0758   IRONPCTSAT 9 (L) 07/24/2017 2957

## 2017-08-08 ENCOUNTER — Inpatient Hospital Stay (HOSPITAL_COMMUNITY): Payer: Medicare Other | Admitting: Speech Pathology

## 2017-08-08 ENCOUNTER — Inpatient Hospital Stay (HOSPITAL_COMMUNITY): Payer: Self-pay

## 2017-08-08 ENCOUNTER — Inpatient Hospital Stay (HOSPITAL_COMMUNITY): Payer: Self-pay | Admitting: Physical Therapy

## 2017-08-08 ENCOUNTER — Inpatient Hospital Stay (HOSPITAL_COMMUNITY): Payer: Self-pay | Admitting: Speech Pathology

## 2017-08-08 LAB — CBC
HEMATOCRIT: 37.2 % — AB (ref 39.0–52.0)
Hemoglobin: 11.4 g/dL — ABNORMAL LOW (ref 13.0–17.0)
MCH: 27.7 pg (ref 26.0–34.0)
MCHC: 30.6 g/dL (ref 30.0–36.0)
MCV: 90.5 fL (ref 78.0–100.0)
Platelets: 364 10*3/uL (ref 150–400)
RBC: 4.11 MIL/uL — AB (ref 4.22–5.81)
RDW: 18.1 % — ABNORMAL HIGH (ref 11.5–15.5)
WBC: 10.2 10*3/uL (ref 4.0–10.5)

## 2017-08-08 LAB — RENAL FUNCTION PANEL
ALBUMIN: 2.5 g/dL — AB (ref 3.5–5.0)
Anion gap: 14 (ref 5–15)
BUN: 34 mg/dL — ABNORMAL HIGH (ref 6–20)
CHLORIDE: 94 mmol/L — AB (ref 101–111)
CO2: 26 mmol/L (ref 22–32)
Calcium: 9.8 mg/dL (ref 8.9–10.3)
Creatinine, Ser: 6.96 mg/dL — ABNORMAL HIGH (ref 0.61–1.24)
GFR, EST AFRICAN AMERICAN: 10 mL/min — AB (ref >60)
GFR, EST NON AFRICAN AMERICAN: 9 mL/min — AB (ref >60)
Glucose, Bld: 87 mg/dL (ref 65–99)
PHOSPHORUS: 4.8 mg/dL — AB (ref 2.5–4.6)
Potassium: 4.6 mmol/L (ref 3.5–5.1)
Sodium: 134 mmol/L — ABNORMAL LOW (ref 135–145)

## 2017-08-08 MED ORDER — HEPARIN SODIUM (PORCINE) 1000 UNIT/ML DIALYSIS: 2400.0000 [IU] | Freq: Once | INTRAMUSCULAR | Status: DC

## 2017-08-08 MED ORDER — FLUTICASONE PROPIONATE 50 MCG/ACT NA SUSP
1.0000 | Freq: Two times a day (BID) | NASAL | Status: DC
  Administered 2017-08-09 – 2017-08-16 (×3): 1 via NASAL
  Filled 2017-08-08: qty 16

## 2017-08-08 MED ORDER — OXYCODONE HCL 5 MG PO TABS
ORAL_TABLET | ORAL | Status: AC
  Filled 2017-08-08: qty 2

## 2017-08-08 MED ORDER — HYDROCERIN EX CREA
TOPICAL_CREAM | Freq: Three times a day (TID) | CUTANEOUS | Status: DC
  Administered 2017-08-08 – 2017-08-16 (×7): via TOPICAL
  Filled 2017-08-08: qty 113

## 2017-08-08 NOTE — Progress Notes (Signed)
Occupational Therapy Session Note  Patient Details  Name: Hayden [name] MRN: [id number] Date of Birth: 12-03-1970  Today's Date: 08/08/2017 OT Individual Time: 5681-2751 OT Individual Time Calculation (min): 57 min    Short Term Goals: Week 1:  OT Short Term Goal 1 (Week 1): Pt will transfer to BSC/ toilet with min A  OT Short Term Goal 2 (Week 1): Pt will thread LB clothing with setup  OT Short Term Goal 3 (Week 1): Pt will tolerate left LE being dependent in prep for transfer/ ADL task OT Short Term Goal 4 (Week 1): Pt will perform bed mobility with mod I in prep for ADL tasks   Skilled Therapeutic Interventions/Progress Updates:    1:1. Pt supine to sitting EOB with supervision to transfer onto w/c via slideboard, however pain in buttocks too painful from sacral wound. Pt reporting 10/10 pain and RN alerted to administer pain medication. Pt sits in bed with supervision and VC for bathing body parts, rolling in bed to bathe buttocks and Vc for problem solving opening soap. Pt discusses different pain management strategies ( coloring/repetitive activities, music, and guided imagery) while washing in bed. Also discussed pain medication management with RN present and scheduling around therapy time. Pt grooms seated up in bed brushing teeth and washing face for sitting balance and tolerance. Exited session iwht pt seated in bed, call light in reach and all needs met.  Therapy Documentation Precautions:  Precautions Precautions: Fall Precaution Comments: pain in left LE Restrictions Weight Bearing Restrictions: No RLE Weight Bearing: Weight bearing as tolerated  See Function Navigator for Current Functional Status.   Therapy/Group: Individual Therapy  Tonny Branch 08/08/2017, 8:19 AM

## 2017-08-08 NOTE — Progress Notes (Signed)
Occupational Therapy Note  Patient Details  Name: Hayden [name] MRN: [id number] Date of Birth: [birth year]  Today's Date: 08/08/2017 OT Individual Time: 1100-1155 OT Individual Time Calculation (min): 55 min   Pt c/o 5/10 pain in LLE; repositioned, RN aware Individual Therapy  Pt resting in w/c upon arrival.  OT intervention with focus on tub bench tranfsers, DME education, ongoing discharge planning, and BUE therex to increase endurance and independence with BADLs. Pt propelled w/c from room to ADL tub room for education on equipment.  Pt stated he had a similar bench at home but it water ran onto floor.  Pt declined practicing tranfser because he wasn't sure if bench would work at home.  Will confirm with mother-in-law.  Pt propelled w/c to Day Room and engaged in BUE threx on [employer]-10 min at work load 5 and 10 min random at work load 3 with extended rest break.  Pt returned to room and agreed to remain in w/c to eat lunch.    Leotis Shames [facility] 08/08/2017, 12:15 PM

## 2017-08-08 NOTE — Progress Notes (Signed)
Physical Therapy Session Note  Patient Details  Name: Hayden [name] MRN: [id number] Date of Birth: [birth year]  Today's Date: 08/08/2017 PT Individual Time: 1000-1100 PT Individual Time Calculation (min): 60 min   Short Term Goals: Week 1:  PT Short Term Goal 1 (Week 1): pt will perform lateral scoot transfer with supervision after set-up with slide board PT Short Term Goal 2 (Week 1): pt will propel w/c x 150' with supervision PT Short Term Goal 3 (Week 1): pt will tolerate wt bearing through L foot during assisted sit> stand, using Stedy PRN  Skilled Therapeutic Interventions/Progress Updates:    Pt greeted sitting upright in bed, agreeable to therapy, reports skipped last pain medication. Transitions to sitting EOB with S, attempts to perform sit<>stand from bed using RW however LLE too painful in dependent position, unable to reach LLE to floor despite multiple attempts. Pt performs A/P transfer bed>manual WC with sliding board under R residual limb per pt request. Pt propels self approximately 300 ft using BUE, multiple rest breaks needed, LLE lowered incrementally to pt tolerance. Pt propels up/down 3% grade ramp with min guard, vcing for technique on down ramp in order to keep pt facing forward and reduce risk of tipping. Pt transfers WC<>mat using slideboard with S, performs 1 x 10 SLR with LLE with pt reporting improved ability to perform compared to evaluation, SAQ over bolster, HS stretch in long sitting. Pt expresses frustration that "the less I do, the less therapy I get", discussed goals of intervention to maintain BLE strength and flexibility until improved ability to weightbear on LLE, pt expresses understanding. Pt rolls supine to prone with minA for trunk, maintains prone for B hip flexor stretch, 1 x 10 hip extension BLE. Pt tranfers to [place] as above, returned to room with [facility] d/t time constraints, pt left upright in [facility] for OT session, denies any needs at this time.   Therapy  Documentation Precautions:  Precautions Precautions: Fall Precaution Comments: pain in left LE Restrictions Weight Bearing Restrictions: No RLE Weight Bearing: Weight bearing as tolerated Pain: Pain Assessment Pain Score: 8  Pain Location: Leg Pain Orientation: Left Pain Onset: With Activity Pain Intervention(s): Medication (See eMAR) Mobility:   Locomotion :    Trunk/Postural Assessment :    Balance:   Exercises:   Other Treatments:     See Function Navigator for Current Functional Status.   Therapy/Group: Individual Therapy  Salvatore Decent 08/08/2017, 12:21 PM

## 2017-08-08 NOTE — Progress Notes (Signed)
Speech Language Pathology Discharge Summary  Patient Details  Name: Hayden [name] MRN: [id number] Date of Birth: 07-Nov-1970  Today's Date: 08/08/2017 SLP Individual Time: 0900-0930 SLP Individual Time Calculation (min): 30 min   Skilled Therapeutic Interventions:  Skilled treatment session focused on completion of cognitive therapy. Pt is largely disinterested in [place] services this session and isn't interested in attempting any formal assessments. [name] present and confirms that overall pt is at baseline level of function. However this baseline ability is further taxed by fatigued, frustration by overall situation, by mother-in-law herself and medication. Education provided to pt and mother-in-law to help cope and both agree that discharge from [place] services is functional.      Patient has met 4 of 4 long term goals.  Patient to discharge at overall Supervision level.    Clinical Impression/Discharge Summary:   Pt is at baseline level of cognitive ability and has met supervision level goals.   Care Partner:  Caregiver Able to Provide Assistance: Yes  Type of Caregiver Assistance: Cognitive  Recommendation:  None      Equipment:     Reasons for discharge: Lack of progress towards goals;Treatment goals met   Patient/Family Agrees with Progress Made and Goals Achieved: Yes   Function:    Cognition Comprehension Comprehension assist level: Understands basic 90% of the time/cues < 10% of the time  Expression   Expression assist level: Expresses basic needs/ideas: With no assist  Social Interaction Social Interaction assist level: Interacts appropriately 90% of the time - Needs monitoring or encouragement for participation or interaction.  Problem Solving Problem solving assist level: Solves basic 90% of the time/requires cueing < 10% of the time;Solves basic problems with no assist  Memory Memory assist level: Recognizes or recalls 90% of the time/requires cueing < 10% of  the time;Recognizes or recalls 75 - 89% of the time/requires cueing 10 - 24% of the time   [name] 08/08/2017, 12:15 PM

## 2017-08-08 NOTE — Progress Notes (Signed)
[facility] Progress Note  Subjective:  No new /co  Vitals:   08/07/17 1439 08/07/17 2055 08/08/17 0437 08/08/17 1322  BP: (!) 148/77 133/81 (!) 168/77 (!) 166/60  Pulse: 65  82 84  Resp: 18  18   Temp: 99.5 F (37.5 C)  97.6 F (36.4 C)   TempSrc: Oral  Oral   SpO2: 90%  93% 94%  Weight:   94 kg (207 lb 3.7 oz)     Inpatient medications: . acetaminophen  650 mg Oral TID WC & HS  . amLODipine  10 mg Oral Daily  . apixaban  5 mg Oral BID  . aspirin  81 mg Oral Daily  . atorvastatin  80 mg Oral q1800  . carvedilol  25 mg Oral BID WC  . cinacalcet  90 mg Oral Q breakfast  . [START ON 08/12/2017] cloNIDine  0.2 mg Transdermal Q Tue  . collagenase   Topical Daily  . divalproex  500 mg Oral Q12H  . escitalopram  10 mg Oral Daily  . ferric citrate  630 mg Oral TID WC  . gabapentin  100 mg Oral BID  . heparin  2,400 Units Dialysis Once in dialysis  . hydrALAZINE  100 mg Oral Q8H  . irbesartan  300 mg Oral Daily  . isosorbide dinitrate  10 mg Oral BID  . lidocaine   Topical QID  . multivitamin  1 tablet Oral QHS  . nicotine  21 mg Transdermal Daily  . oxyCODONE  10 mg Oral Q12H  . sevelamer carbonate  2,400 mg Oral TID WC  . sodium chloride flush  3 mL Intravenous Q12H   . 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 LUA AVF NF, ox3  Dialysis: MWF [facility] 4h  98kg (lower at dc) 2/2.25 bath  LUA AVF   Hep 2400 - Hectoral 82mcg IV q HD(D/c'd) - Venofer 100mg  x 10 ordered (4 given) - Mircera 219mcg IV q 2 weeks (last 1/18) - Na  thiosulfate 25 gm QHD started this hospitalization -home BP-norvasc 10/ coreg 25 bid/ clon patch 0.2 / hydral 100 tid/ avapro 300      Impression: 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 3  CM/ severe AS, not surg candidate 4  CVA mult, small 5  Aflutter, changing to eliquis ,renal dose per pharm (for #1) 6  HTN mult meds, bp ok 7  Vol under dry, lower edw at dc 8  AMS resolved 9  PVD 10  DM2 11  Bipolar 12  Anemia ckd, Hb good, will dc Fe load d/t #1 13  CAD sp cath 2V CAD, med Rx 14  EOL - DNR now  Plan - HD today, na thio, eliquis    Kelly Splinter MD [facility] pager [phone]   08/08/2017, 1:37 PM   Recent Labs  Lab 08/04/17 0656 08/05/17 0820 08/06/17 1630  NA 135 133* 133*  K 5.0 4.0 4.8  CL 93* 93* 91*  CO2 20* 22 24  GLUCOSE 62* 97 87  BUN 42* 24* 38*  CREATININE 7.74* 5.66* 7.51*  CALCIUM 9.1 9.4 9.7  PHOS 6.0* 5.0* 6.2*   Recent Labs  Lab 08/04/17 [phone]  08/05/17 0820 08/06/17 1630  ALBUMIN 2.4* 2.5* 2.6*   Recent Labs  Lab 08/05/17 0820 08/06/17 1630 08/08/17 1159  WBC 11.5* 10.9* 10.2  HGB 11.2* 11.1* 11.4*  HCT 35.9* 35.7* 37.2*  MCV 88.4 89.0 90.5  PLT 359 367 364   Iron/TIBC/Ferritin/ %Sat    Component Value Date/Time   IRON 17 (L) 07/24/2017 0758   TIBC 197 (L) 07/24/2017 0758   FERRITIN 1,427 (H) 07/24/2017 0758   IRONPCTSAT 9 (L) 07/24/2017 4446

## 2017-08-08 NOTE — Progress Notes (Signed)
[place] PHYSICAL MEDICINE & REHABILITATION     PROGRESS NOTE    Subjective/Complaints: Still complaining of pain in left leg. Slept off and on last night. Still picking at wounds  ROS: pt denies nausea, vomiting, diarrhea, cough, shortness of breath or chest pain   Objective: Vital Signs: Blood pressure (!) 168/77, pulse 82, temperature 97.6 F (36.4 C), temperature source Oral, resp. rate 18, weight 94 kg (207 lb 3.7 oz), SpO2 93 %. No results found. Recent Labs    08/06/17 1630  WBC 10.9*  HGB 11.1*  HCT 35.7*  PLT 367   Recent Labs    08/06/17 1630  NA 133*  K 4.8  CL 91*  GLUCOSE 87  BUN 38*  CREATININE 7.51*  CALCIUM 9.7   CBG (last 3)  Recent Labs    08/06/17 0636 08/06/17 1147 08/07/17 0652  GLUCAP 75 108* 87    Wt Readings from Last 3 Encounters:  08/08/17 94 kg (207 lb 3.7 oz)  08/04/17 96 kg (211 lb 10.3 oz)  06/03/17 96.6 kg (213 lb)    Physical Exam:  Constitutional: He appears well-developed and well-nourished.  HENT:  Head: Normocephalic.  Eyes: Pupils are equal, round, and reactive to light.  Neck: Normal range of motion.  Cardiovascular: RRR without murmur. No JVD   Chest: CTA B GI: Soft. He exhibits no distension. There is no tenderness.  Musculoskeletal: He exhibits tr edema.  Neurological: No cranial nerve deficit.  Bilateral UE's 4/5. RLE 4-/5 HF. LLE 3 to 3+/5 with limitations due to pain. Limited insight and awareness.  Skin:  Tattoos, LLE with erythema, eschar around healing wound, dressed. Weeping scabs on left temple and chest with blood on blanket Psychiatric:  cooperative   Assessment/Plan: 1. 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 Physiatrist is providing close team supervision and 24 hour management of active medical problems listed below. Physiatrist and rehab team continue to assess barriers to  discharge/monitor patient progress toward functional and medical goals.  Function:  Bathing Bathing position   Position: Bed  Bathing parts Body parts bathed by patient: Right arm, Left arm, Chest, Abdomen, Front perineal area, Right upper leg, Left upper leg, Buttocks Body parts bathed by helper: Back  Bathing assist Assist Level: Supervision or verbal cues, Set up   Set up : To obtain items  Upper Body Dressing/Undressing Upper body dressing   What is the patient wearing?: Pull over shirt/dress     Pull over shirt/dress - Perfomed by patient: Thread/unthread right sleeve, Thread/unthread left sleeve, Put head through opening, Pull shirt over trunk          Upper body assist Assist Level: Supervision or verbal cues      Lower Body Dressing/Undressing Lower body dressing   What is the patient wearing?: Pants     Pants- Performed by patient: Thread/unthread right pants leg, Thread/unthread left pants leg, Pull pants up/down Pants- Performed by helper: Thread/unthread right pants leg, Thread/unthread left pants leg, Pull pants up/down                      Lower body assist Assist for lower body dressing: Supervision or verbal cues      Toileting Toileting Toileting activity did not occur: No continent bowel/bladder event        Toileting assist     Transfers Chair/bed transfer   Chair/bed transfer method: Lateral scoot  Chair/bed transfer assist level: Touching or steadying assistance (Pt > 75%) Chair/bed transfer assistive device: Sliding board, Armrests     Locomotion Ambulation Ambulation activity did not occur: Safety/medical concerns(unable to tolerate wt bearing LLE)         Wheelchair   Type: Manual Max wheelchair distance: 150 Assist Level: Supervision or verbal cues  Cognition Comprehension Comprehension assist level: Understands basic 90% of the time/cues < 10% of the time, Follows basic conversation/direction with extra time/assistive  device  Expression Expression assist level: Expresses basic needs/ideas: With no assist  Social Interaction Social Interaction assist level: Interacts appropriately 90% of the time - Needs monitoring or encouragement for participation or interaction.  Problem Solving Problem solving assist level: Solves basic 90% of the time/requires cueing < 10% of the time, Solves basic problems with no assist  Memory Memory assist level: Recognizes or recalls 90% of the time/requires cueing < 10% of the time, Recognizes or recalls 75 - 89% of the time/requires cueing 10 - 24% of the time   Medical Problem List and Plan:  1. Functional and mobility deficits secondary to debility and cardio-embolic CVA/encephalopathy  -continue therapies 2. Anticoagulation: Pharmaceutical: Coumadin therapeutic. Should stay on medication for stroke prophylaxis  3. Chronic back pain/Pain Management: Will schedule Oxycodone prior to therapy and prn during the day with gabapentin bid.      -continue oxy CR 10mg  q 12--discussed with him the fact that we are being careful with increasing his narcotics given his history. Consider increasing to 20mg  this weekend.    -discussed other techniques for pain relief including relaxation, tactile feedback   -physical therapy will work on strategies with him as well. 4. Bipolar disorder/Mood: continue Lexapro and depakote. LCSW to follow for evaluation and support.  5. Neuropsych: This patient is not capable of making decisions on his own behalf.  6. Skin/Wound Care: recent bx with vasculitis c/w calciphylaxis  -renvela/thiosulfate with HDper renal recs  -pain mgt  -cover left leg daily with non-adherent dressing. Can remove at night 7. Fluids/Electrolytes/Nutrition: strict I/O. Renal diet with 1200 cc FR/day.   8. CAD/Systolic CHF with severe AS, MS/a-flutter: deemed non-operative candidate  -Treated medically with ASA, Lipitor, Coreg and Isordil  -daily weights, volume mgt  -palliative  care consult for goals of care was reviewed  9. ESRD: Continue HD MWF. Scheduled at end of the day to help with activity tolerance.  10. HTN: Monitor BID. Continue Apresoline, Avapro, Norvasc, Coreg, Catapres and Isordil.  11. Tobacco abuse: Continue nicotine patch.  12 . H/o Seizure disorder: On Depakote.  13. Neuropathy: continue gabapentin bid.  14. Anemia of chronic disease: Stable.  15. T2DM: Diet controlled at present    -Continue to monitor BS ac/hs. ---controlled at present   -Use SSI for elevated BS.    LOS (Days) 3 A Bowles T, MD 08/08/2017 8:43 AM
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
## 2017-08-08 NOTE — IPOC Note (Signed)
Overall Plan of Care [facility]) Patient Details Name: Hayden [name] MRN: [id number] DOB: [birth year]  Admitting Diagnosis: Stroke due to embolism [facility]), left leg cellulitis  Hospital Problems: Principal Problem:   Stroke due to embolism [facility]) Active Problems:   End stage renal disease on dialysis ([place])   Neuropathic pain   Above knee amputation of right lower extremity ([place])   Calciphylaxis     Functional Problem List: Nursing Bowel, Edema, Endurance, Medication Management, Motor, Nutrition, Pain, Perception, Safety, Sensory, Skin Integrity  PT Balance, Edema, Endurance, Pain, Safety  OT Balance, Safety, Sensory, Cognition, Endurance, Motor, Pain, Skin Integrity  SLP Cognition  TR         Basic ADL's: OT Grooming, Bathing, Dressing, Toileting     Advanced  ADL's: OT       Transfers: PT Bed Mobility, Bed to Chair, Manufacturing systems engineer, Metallurgist: PT Ambulation, Wheelchair Mobility     Additional Impairments: OT None  SLP Social Cognition   Memory, Attention, Awareness  TR      Anticipated Outcomes Item Anticipated Outcome  Self Feeding n/a  Swallowing      Basic self-care  supervision   Toileting  supervision    Bathroom Transfers supervision   Bowel/Bladder  managed HD MWF   Transfers  modified indepependent  Locomotion  modified independend w/c propulsion controlled  and home env x 150; gait TBD  Communication     Cognition  Supervision  Pain  less than 4  Safety/Judgment  min assist    Therapy Plan: PT Intensity: Minimum of 1-2 x/day ,45 to 90 minutes PT Frequency: 5 out of 7 days PT Duration Estimated Length of Stay: 14-17 days OT Intensity: Minimum of 1-2 x/day, 45 to 90 minutes OT Frequency: 5 out of 7 days OT Duration/Estimated Length of Stay: ~12-14 days SLP Intensity: Minumum of 1-2 x/day, 30 to 90 minutes SLP Frequency: 3 to 5 out of 7 days SLP Duration/Estimated Length of Stay: 1 week for ST    Team  Interventions: Nursing Interventions Patient/Family Education, Bowel Management, Disease Management/Prevention, Pain Management, Medication Management, Skin Care/Wound Management, Discharge Planning, Psychosocial Support  PT interventions Ambulation/gait training, Training and development officer, Cognitive remediation/compensation, Discharge planning, Community reintegration, DME/adaptive equipment instruction, Neuromuscular re-education, Psychosocial support, UE/LE Strength taining/ROM, Wheelchair propulsion/positioning, UE/LE Coordination activities, Therapeutic Activities, Pain management, Functional electrical stimulation, Functional mobility training, Patient/family education, Splinting/orthotics, Therapeutic Exercise  OT Interventions Balance/vestibular training, Discharge planning, Pain management, Self Care/advanced ADL retraining, Therapeutic Activities, Cognitive remediation/compensation, Disease mangement/prevention, Functional mobility training, Patient/family education, Skin care/wound managment, Therapeutic Exercise, Community reintegration, DME/adaptive equipment instruction, Neuromuscular re-education, Psychosocial support, Splinting/orthotics, UE/LE Strength taining/ROM, Wheelchair propulsion/positioning  SLP Interventions Cognitive remediation/compensation, Patient/family education, Internal/external aids  TR Interventions    SW/CM Interventions Discharge Planning, Psychosocial Support, Patient/Family Education   Barriers to Discharge MD  Medical stability and Wound care  Nursing Wound Care    PT      OT Other (comments) pain   SLP      SW       Team Discharge Planning: Destination: PT-Home ,OT- Home , SLP-Home Projected Follow-up: PT-Home health PT, OT-  Home health OT, SLP-None Projected Equipment Needs: PT-To be determined, OT- To be determined, SLP-None recommended by SLP Equipment Details: PT-pt owns RW, manual w/c and scooter; used scooter in the community via personal  [place], OT-  Patient/family involved in discharge planning: PT- Patient, Family member/caregiver,  OT-Patient, SLP-Patient  MD ELOS: 12-15 days Medical Rehab Prognosis:  Excellent Assessment: The patient has been admitted for CIR therapies with the diagnosis of CVA/encephalopathy/debility. The team will be addressing functional mobility, strength, stamina, balance, safety, adaptive techniques and equipment, self-care, bowel and bladder mgt, patient and caregiver education, wound care, pain mgt, NMR, cognition, communication, ego support. Goals have been set at supervision for basic self-care and ADL's as well as cognition, mod I for basic transfers and w/c mobility.    Meredith Staggers, MD, FAAPMR      See Team Conference Notes for weekly updates to the plan of care

## 2017-08-09 ENCOUNTER — Inpatient Hospital Stay (HOSPITAL_COMMUNITY): Payer: Self-pay

## 2017-08-09 DIAGNOSIS — D638 Anemia in other chronic diseases classified elsewhere: Secondary | ICD-10-CM

## 2017-08-09 DIAGNOSIS — G894 Chronic pain syndrome: Secondary | ICD-10-CM

## 2017-08-09 DIAGNOSIS — E669 Obesity, unspecified: Secondary | ICD-10-CM

## 2017-08-09 DIAGNOSIS — I5042 Chronic combined systolic (congestive) and diastolic (congestive) heart failure: Secondary | ICD-10-CM

## 2017-08-09 DIAGNOSIS — I350 Nonrheumatic aortic (valve) stenosis: Secondary | ICD-10-CM

## 2017-08-09 DIAGNOSIS — I10 Essential (primary) hypertension: Secondary | ICD-10-CM

## 2017-08-09 DIAGNOSIS — E1169 Type 2 diabetes mellitus with other specified complication: Secondary | ICD-10-CM

## 2017-08-09 MED ORDER — LACTULOSE 10 GM/15ML PO SOLN
20.0000 g | Freq: Once | ORAL | Status: AC
Start: 2017-08-09 — End: 2017-08-09
  Administered 2017-08-09: 20 g via ORAL
  Filled 2017-08-09: qty 30

## 2017-08-09 NOTE — Progress Notes (Signed)
[place] PHYSICAL MEDICINE & REHABILITATION     PROGRESS NOTE    Subjective/Complaints: Patient seen lying in bed this morning. He states he slept well overnight.  ROS: Denies nausea, vomiting, diarrhea, shortness of breath or chest pain   Objective: Vital Signs: Blood pressure (!) 165/80, pulse 73, temperature 97.9 F (36.6 C), temperature source Oral, resp. rate 18, weight 95 kg (209 lb 7 oz), SpO2 93 %. No results found. Recent Labs    08/06/17 1630 08/08/17 1159  WBC 10.9* 10.2  HGB 11.1* 11.4*  HCT 35.7* 37.2*  PLT 367 364   Recent Labs    08/06/17 1630 08/08/17 1330  NA 133* 134*  K 4.8 4.6  CL 91* 94*  GLUCOSE 87 87  BUN 38* 34*  CREATININE 7.51* 6.96*  CALCIUM 9.7 9.8   CBG (last 3)  Recent Labs    08/06/17 1147 08/07/17 0652  GLUCAP 108* 87    Wt Readings from Last 3 Encounters:  08/09/17 95 kg (209 lb 7 oz)  08/04/17 96 kg (211 lb 10.3 oz)  06/03/17 96.6 kg (213 lb)    Physical Exam:  Constitutional: He appears well-developed and well-nourished.  HENT: Normocephalic. Atraumatic Eyes: EOMI. No discharge.  Cardiovascular: RRR. No JVD   Chest: CTA B. Unlabored GI: Soft. He exhibits no distension.  Musculoskeletal: Left lower extremity tenderness  Neurological: No cranial nerve deficit.  Motor: Bilateral UE's 4/5.  RLE: 4/5 HF.  LLE 4-/5 proximal to distal (pain inhibition) Limited insight and awareness.  Skin:  Tattoos, LLE with erythema, eschar around healing wound.  Weeping scabs on left temple and chest with blood on pillow Psychiatric: cooperative  Assessment/Plan: 1. 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 Physiatrist is providing close team supervision and 24 hour management of active medical problems listed below. Physiatrist and rehab team continue to assess barriers to discharge/monitor patient progress toward  functional and medical goals.  Function:  Bathing Bathing position   Position: Bed  Bathing parts Body parts bathed by patient: Right arm, Left arm, Chest, Abdomen, Front perineal area, Right upper leg, Left upper leg, Buttocks Body parts bathed by helper: Back  Bathing assist Assist Level: Supervision or verbal cues, Set up   Set up : To obtain items  Upper Body Dressing/Undressing Upper body dressing   What is the patient wearing?: Pull over shirt/dress     Pull over shirt/dress - Perfomed by patient: Thread/unthread right sleeve, Thread/unthread left sleeve, Put head through opening, Pull shirt over trunk          Upper body assist Assist Level: Supervision or verbal cues      Lower Body Dressing/Undressing Lower body dressing   What is the patient wearing?: Pants     Pants- Performed by patient: Thread/unthread right pants leg, Thread/unthread left pants leg, Pull pants up/down Pants- Performed by helper: Thread/unthread right pants leg, Thread/unthread left pants leg, Pull pants up/down                      Lower body assist Assist for lower body dressing: Supervision or verbal cues      Toileting Toileting Toileting activity did not occur: No continent bowel/bladder event   Toileting steps completed by helper: Adjust clothing prior to toileting, Performs perineal hygiene, Adjust clothing after toileting(per Haskell Flirt Aquit, NT report)    Toileting assist Assist level: Touching or steadying assistance (Pt.75%)(per  Haskell Flirt Aquit, NT report)   Transfers Chair/bed transfer   Chair/bed transfer method: Lateral scoot Chair/bed transfer assist level: Touching or steadying assistance (Pt > 75%) Chair/bed transfer assistive device: Sliding board, Armrests     Locomotion Ambulation Ambulation activity did not occur: Safety/medical concerns(unable to tolerate wt bearing LLE)         Wheelchair   Type: Manual Max wheelchair distance: 150 Assist Level:  Supervision or verbal cues  Cognition Comprehension Comprehension assist level: Understands basic 90% of the time/cues < 10% of the time  Expression Expression assist level: Expresses basic needs/ideas: With no assist  Social Interaction Social Interaction assist level: Interacts appropriately 90% of the time - Needs monitoring or encouragement for participation or interaction.  Problem Solving Problem solving assist level: Solves basic 90% of the time/requires cueing < 10% of the time, Solves basic problems with no assist  Memory Memory assist level: Recognizes or recalls 90% of the time/requires cueing < 10% of the time, Recognizes or recalls 75 - 89% of the time/requires cueing 10 - 24% of the time   Medical Problem List and Plan:  1. Functional and mobility deficits secondary to debility and cardio-embolic CVA/encephalopathy    Cont CIR   Notes reviewed, images reviewed, labs reviewed 2. Anticoagulation: Pharmaceutical:    Coumadin therapeutic.  3. Chronic back pain/Pain Management: Will schedule Oxycodone prior to therapy and prn during the day with gabapentin bid.      -continue oxy CR 10mg  q 12--discussed with him the fact that we are being careful with increasing his narcotics given his history.    Techniques for pain relief including relaxation, tactile feedback have been discussed   Physical therapy will work on strategies with him as well. 4. Bipolar disorder/Mood: continue Lexapro and depakote. LCSW to follow for evaluation and support.  5. Neuropsych: This patient is not capable of making decisions on his own behalf.  6. Skin/Wound Care: recent bx with vasculitis c/w calciphylaxis  -renvela/thiosulfate with HDper renal recs  -pain mgt  -cover left leg daily with non-adherent dressing. Can remove at night 7. Fluids/Electrolytes/Nutrition: strict I/O. Renal diet with 1200 cc FR/day.   8. CAD/Systolic CHF with severe AS, MS/a-flutter: deemed non-operative candidate  -Treated  medically with ASA, Lipitor, Coreg and Isordil  -daily weights, volume mgt  -palliative care consult for goals of care  Filed Weights   08/08/17 0437 08/08/17 1340 08/09/17 0411  Weight: 94 kg (207 lb 3.7 oz) 94 kg (207 lb 3.7 oz) 95 kg (209 lb 7 oz)  9. ESRD: Continue HD MWF. Scheduled at end of the day to help with activity tolerance.  10. HTN: Monitor BID. Continue Apresoline, Avapro, Norvasc, Coreg, Catapres and Isordil.    Labile with HD 11. Tobacco abuse: Continue nicotine patch.  12 . H/o Seizure disorder: On Depakote.  13. Neuropathy: continue gabapentin bid.  14. Anemia of chronic disease:    Hemoglobin 11.4 on 2/8    Continue to monitor 15. T2DM: Diet controlled at present    -Continue to monitor BS ac/hs.    -Use SSI for elevated BS.    Relatively controlled on 2/8 16. Morbid obesity   Body mass index is 30.05 kg/m.   Diet and exercise education   Will cont to encourage weight loss to increase endurance and promote overall health   LOS (Days) 4 A FACE TO FACE EVALUATION WAS PERFORMED  Brindley Madarang Lorie Phenix, MD 08/09/2017 7:21 AM

## 2017-08-09 NOTE — Progress Notes (Signed)
Occupational Therapy Session Note  Patient Details  Name: Hayden [name] MRN: [id number] Date of Birth: 07-Aug-1970  Today's Date: 08/09/2017 OT Individual Time: 1407-1500 OT Individual Time Calculation (min): 53 min    Short Term Goals: Week 1:  OT Short Term Goal 1 (Week 1): Pt will transfer to BSC/ toilet with min A  OT Short Term Goal 2 (Week 1): Pt will thread LB clothing with setup  OT Short Term Goal 3 (Week 1): Pt will tolerate left LE being dependent in prep for transfer/ ADL task OT Short Term Goal 4 (Week 1): Pt will perform bed mobility with mod I in prep for ADL tasks   Skilled Therapeutic Interventions/Progress Updates:    1:1. Pt without dressing on LLE. RN alerted and redresses. Pt dons shirt with set up while waiting for RN. Pt misses 7 min skilled OT d/t RN care. Pt requesting to get up OOB this session. Pt slide board trnasfer throughotu session with supervision and A for placement of board. Pt requesting to complete BUE therex for strengthening. Pt says, "im the weakest ive been in my life." Pt propels w.c to/from tx destinations for BUE strength/endurance. PT completes 2x30 basketball passes (chest, overhead and bounce) with 1# wristweights and cues for isometric shoulder flexion hold in between passes for BUE strength/endurance required for BADLs. Pt uses 2# dowel rod to chest press/overhead press to bounce beach ball back to therapist 2x30 reps to improve sitting balance, postural control, reaching, and BUE strength required for BADLs. Exited session with pt seated in w/c, call light in reach, son in room and all needs met.    Therapy Documentation Precautions:  Precautions Precautions: Fall Precaution Comments: pain in left LE Restrictions Weight Bearing Restrictions: No RLE Weight Bearing: Weight bearing as tolerated  See Function Navigator for Current Functional Status.   Therapy/Group: Individual Therapy  Tonny Branch 08/09/2017, 2:10 PM

## 2017-08-10 ENCOUNTER — Inpatient Hospital Stay (HOSPITAL_COMMUNITY): Payer: Self-pay | Admitting: Physical Therapy

## 2017-08-10 ENCOUNTER — Inpatient Hospital Stay (HOSPITAL_COMMUNITY): Payer: Self-pay

## 2017-08-10 DIAGNOSIS — G8918 Other acute postprocedural pain: Secondary | ICD-10-CM

## 2017-08-10 LAB — GLUCOSE, CAPILLARY: Glucose-Capillary: 96 mg/dL (ref 65–99)

## 2017-08-10 NOTE — Plan of Care (Signed)
  Not Progressing RH BOWEL ELIMINATION RH STG MANAGE BOWEL WITH ASSISTANCE Description STG Manage Bowel with  [place].  08/10/2017 0152 - Not Progressing by Gwendolyn Grant, RN Note Last BM 2/4. Laxative given, awaiting for result. Pt refused dulcolax suppository and enema. RH SKIN INTEGRITY RH STG SKIN FREE OF INFECTION/BREAKDOWN Description Prevent further breakdown with max assist  08/10/2017 0152 - Not Progressing by Gwendolyn Grant, RN Note Pt refused dressing on sacrum at this time.

## 2017-08-10 NOTE — Progress Notes (Signed)
[facility]IC[facility]ILIT[facility]     PROGRESS NOTE    Subjective/Complaints: Patient seen lying in bed this morning. He states he slept well overnight. He denies complaints.  ROS: Denies nausea, vomiting, diarrhea, shortness of breath or chest pain   Objective: Vital Signs: Blood pressure (!) 153/80, pulse 72, temperature 97.6 F (36.4 C), temperature source Oral, resp. rate 18, weight 94.8 kg (209 lb), SpO2 97 %. No results found. Recent Labs    08/08/17 1159  WBC 10.2  HGB 11.4*  HCT 37.2*  PLT 364   Recent Labs    08/08/17 1330  NA 134*  K 4.6  CL 94*  GLUCOSE 87  BUN 34*  CREATININE 6.96*  CALCIUM 9.8   CBG (last 3)  No results for input(s): GLUCAP in the last 72 hours.  Wt Readings from Last 3 Encounters:  08/10/17 94.8 kg (209 lb)  08/04/17 96 kg (211 lb 10.3 oz)  06/03/17 96.6 kg (213 lb)    Physical Exam:  Constitutional: He appears well-developed and well-nourished.  HENT: Normocephalic. Atraumatic Eyes: EOMI. No discharge.  Cardiovascular: RRR. No JVD. + Murmur   Chest: CTA B. Unlabored GI: Soft. He exhibits no distension.  Musculoskeletal: Left lower extremity tenderness  Neurological: No cranial nerve deficit.  Motor: Bilateral UE's 4/5.  RLE: 4/5 HF.  LLE 4-/5 proximal to distal (pain inhibition, stable) Limited insight and awareness.  Skin:  Tattoos, LLE with erythema, eschar around healing wound.  Weeping scabs on left temple and chest with blood on pillow Psychiatric: cooperative  Assessment/Plan: 1. 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 Physiatrist is providing close team supervision and 24 hour management of active medical problems listed below. Physiatrist and rehab team continue to assess barriers to discharge/monitor patient progress toward functional and medical  goals.  Function:  Bathing Bathing position   Position: Bed  Bathing parts Body parts bathed by patient: Right arm, Left arm, Chest, Abdomen, Front perineal area, Right upper leg, Left upper leg, Buttocks Body parts bathed by helper: Back  Bathing assist Assist Level: Supervision or verbal cues, Set up   Set up : To obtain items  Upper Body Dressing/Undressing Upper body dressing   What is the patient wearing?: Pull over shirt/dress     Pull over shirt/dress - Perfomed by patient: Thread/unthread right sleeve, Thread/unthread left sleeve, Put head through opening, Pull shirt over trunk          Upper body assist Assist Level: Supervision or verbal cues      Lower Body Dressing/Undressing Lower body dressing   What is the patient wearing?: Pants     Pants- Performed by patient: Thread/unthread right pants leg, Thread/unthread left pants leg, Pull pants up/down Pants- Performed by helper: Thread/unthread right pants leg, Thread/unthread left pants leg, Pull pants up/down                      Lower body assist Assist for lower body dressing: Supervision or verbal cues      Toileting Toileting Toileting activity did not occur: No continent bowel/bladder event   Toileting steps completed by helper: Adjust clothing prior to toileting, Performs perineal hygiene, Adjust clothing after toileting(per [place] Flirt [place], NT report)    Toileting assist Assist level: Touching or steadying assistance (Pt.75%)(per [place], NT report)   Transfers Chair/bed transfer   Chair/bed transfer method: Lateral scoot Chair/bed  transfer assist level: Touching or steadying assistance (Pt > 75%) Chair/bed transfer assistive device: Sliding board, Armrests     Locomotion Ambulation Ambulation activity did not occur: Safety/medical concerns(unable to tolerate wt bearing LLE)         Wheelchair   Type: Manual Max wheelchair distance: 150 Assist Level: Supervision or verbal  cues  Cognition Comprehension Comprehension assist level: Understands basic 90% of the time/cues < 10% of the time  Expression Expression assist level: Expresses basic needs/ideas: With no assist  Social Interaction Social Interaction assist level: Interacts appropriately 90% of the time - Needs monitoring or encouragement for participation or interaction.  Problem Solving Problem solving assist level: Solves basic 90% of the time/requires cueing < 10% of the time  Memory Memory assist level: Recognizes or recalls 90% of the time/requires cueing < 10% of the time   Medical Problem List and Plan:  1. Functional and mobility deficits secondary to debility and cardio-embolic CVA/encephalopathy    Cont CIR 2. Anticoagulation: Pharmaceutical:    Coumadin therapeutic.  3. Chronic back pain/Pain Management: Will schedule Oxycodone prior to therapy and prn during the day with gabapentin bid.      -continue oxy CR 10mg  q 12--discussed with him the fact that we are being careful with increasing his narcotics given his history.    Techniques for pain relief including relaxation, tactile feedback have been discussed   Physical therapy will work on strategies with him as well.     Control at present 4. Bipolar disorder/Mood: continue Lexapro and depakote. LCSW to follow for evaluation and support.  5. Neuropsych: This patient is not capable of making decisions on his own behalf.  6. Skin/Wound Care: recent bx with vasculitis c/w calciphylaxis  -renvela/thiosulfate with HDper renal recs  -pain mgt  -cover left leg daily with non-adherent dressing. Can remove at night 7. Fluids/Electrolytes/Nutrition: strict I/O. Renal diet with 1200 cc FR/day.   8. CAD/Systolic CHF with severe AS, MS/a-flutter: deemed non-operative candidate  -Treated medically with ASA, Lipitor, Coreg and Isordil  -daily weights, volume mgt  -palliative care consult for goals of care  [facility] Weights   08/08/17 1340 08/09/17 0411  08/10/17 0519  Weight: 94 kg (207 lb 3.7 oz) 95 kg (209 lb 7 oz) 94.8 kg (209 lb)  9. ESRD: Continue HD MWF. Scheduled at end of the day to help with activity tolerance.  10. HTN: Monitor BID. Continue Apresoline, Avapro, Norvasc, Coreg, Catapres and Isordil.    Labile with HD, but overall within acceptable range on 2/10 11. Tobacco abuse: Continue nicotine patch.  12 . H/o Seizure disorder: On Depakote.  13. Neuropathy: continue gabapentin bid.  14. Anemia of chronic disease:    Hemoglobin 11.4 on 2/8    Continue to monitor 15. T2DM: Diet controlled at present    -Continue to monitor BS ac/hs.    -Use SSI for elevated BS.    Relatively controlled on 2/8 16. Morbid obesity   Body mass index is 29.99 kg/m.   Diet and exercise education   Will cont to encourage weight loss to increase endurance and promote overall health   LOS (Days) 5 A FACE TO FACE EVALUATION WAS PERFORMED  Antoine Vandermeulen Lorie Phenix, MD 08/10/2017 6:57 AM

## 2017-08-10 NOTE — Progress Notes (Signed)
Occupational Therapy Session Note  Patient Details  Name: Hayden [name] MRN: [id number] Date of Birth: Aug 13, 1970  Today's Date: 08/10/2017 OT Individual Time: 0768-0881 OT Individual Time Calculation (min): 55 min    Short Term Goals: Week 1:  OT Short Term Goal 1 (Week 1): Pt will transfer to BSC/ toilet with min A  OT Short Term Goal 2 (Week 1): Pt will thread LB clothing with setup  OT Short Term Goal 3 (Week 1): Pt will tolerate left LE being dependent in prep for transfer/ ADL task OT Short Term Goal 4 (Week 1): Pt will perform bed mobility with mod I in prep for ADL tasks   Skilled Therapeutic Interventions/Progress Updates:    1:1. Pt asleep but easily arousable. Pt agreeable to bathing at sink today. Pt transfers EOB>w/c with set up of slide board. Pt bathes UB at sinkw ith set up. Pt unable to remember he donned current clothes yesterday. Pt leans laterally with cueing from OT to cleans buttocks/peri area and advance new pants past hips. Pt requires Vc to lean onto elbow into recliner next to w/c for clothing management and VC to turn hand palm out to reach back and pull pants past hips. Ultimately required MOD A for clothing mangment after leaning using w/c push up so OT could finish advancing pants past hips. Pt doffs/dons clean sock. Pt grooms at sink with set up seated in w/c. Pt applies pillow cases to pillows for BUE strength and coordination completing close quarters w/c management in room with supervision. Exited session with pt set up with breakfast seated in w/c and RN in room administering meds  Therapy Documentation Precautions:  Precautions Precautions: Fall Precaution Comments: pain in left LE Restrictions Weight Bearing Restrictions: No RLE Weight Bearing: Weight bearing as tolerated  See Function Navigator for Current Functional Status.   Therapy/Group: Individual Therapy  Tonny Branch 08/10/2017, 8:14 AM

## 2017-08-10 NOTE — Progress Notes (Signed)
[facility] Progress Note  Subjective: leg pain "not too bad", up in chair bathing in the sink  Vitals:   08/09/17 0411 08/09/17 1600 08/09/17 2133 08/10/17 0519  BP: (!) 165/80 (!) 163/95 (!) 141/67 (!) 153/80  Pulse: 73 78  72  Resp: 18 20  18   Temp: 97.9 F (36.6 C)   97.6 F (36.4 C)  TempSrc: Oral   Oral  SpO2: 93% 92%  97%  Weight: 95 kg (209 lb 7 oz)   94.8 kg (209 lb)    Inpatient medications: . acetaminophen  650 mg Oral TID WC & HS  . amLODipine  10 mg Oral Daily  . apixaban  5 mg Oral BID  . aspirin  81 mg Oral Daily  . atorvastatin  80 mg Oral q1800  . carvedilol  25 mg Oral BID WC  . cinacalcet  90 mg Oral Q breakfast  . [START ON 08/12/2017] cloNIDine  0.2 mg Transdermal Q Tue  . collagenase   Topical Daily  . divalproex  500 mg Oral Q12H  . escitalopram  10 mg Oral Daily  . ferric citrate  630 mg Oral TID WC  . fluticasone  1 spray Each Nare BID  . gabapentin  100 mg Oral BID  . hydrALAZINE  100 mg Oral Q8H  . hydrocerin   Topical TID  . irbesartan  300 mg Oral Daily  . isosorbide dinitrate  10 mg Oral BID  . lidocaine   Topical QID  . multivitamin  1 tablet Oral QHS  . nicotine  21 mg Transdermal Daily  . oxyCODONE  10 mg Oral Q12H  . sevelamer carbonate  2,400 mg Oral TID WC  . sodium chloride flush  3 mL Intravenous Q12H   . 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 LUA AVF NF, ox3  Dialysis: MWF [facility] 4h  98kg (lower at dc) 2/2.25 bath  LUA AVF   Hep 2400 - Hectoral 63mcg IV q HD(D/c'd) -  Venofer 100mg  x 10 ordered (4 given) - Mircera 254mcg IV q 2 weeks (last 1/18) - Na thiosulfate 25 gm QHD started this hospitalization -home BP-norvasc 10/ coreg 25 bid/ clon patch 0.2 / hydral 100 tid/ avapro 300      Impression: 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 3  CM/ severe AS, not surg candidate 4  CVA mult, small 5  Aflutter, changing to eliquis ,renal dose per pharm (for #1) 6  HTN mult meds, bp ok 7  Vol under dry, lower edw at dc 8  AMS resolved 9  PVD 10  DM2 11  Bipolar 12  Anemia ckd, Hb good, dc'd Fe load d/t #1 13  CAD sp cath 2V CAD, med Rx 14  EOL - DNR now  Plan - HD Monday, na thio, eliquis    Kelly Splinter MD [facility] pager [phone]   08/10/2017, 10:23 AM   Recent Labs  Lab 08/05/17 0820 08/06/17 1630 08/08/17 1330  NA 133* 133* 134*  K 4.0 4.8 4.6  CL 93* 91* 94*  CO2 22 24 26   GLUCOSE 97 87 87  BUN 24* 38* 34*  CREATININE 5.66* 7.51* 6.96*  CALCIUM 9.4 9.7 9.8  PHOS 5.0* 6.2* 4.8*   Recent Labs  Lab 08/05/17 0820 08/06/17 1630 08/08/17 1330  ALBUMIN 2.5* 2.6* 2.5*   Recent Labs  Lab 08/05/17 0820 08/06/17 1630 08/08/17 1159  WBC 11.5* 10.9* 10.2  HGB 11.2* 11.1* 11.4*  HCT 35.9* 35.7* 37.2*  MCV 88.4 89.0 90.5  PLT 359 367 364   Iron/TIBC/Ferritin/ %Sat    Component Value Date/Time   IRON 17 (L) 07/24/2017 0758   TIBC 197 (L) 07/24/2017 0758   FERRITIN 1,427 (H) 07/24/2017 0758   IRONPCTSAT 9 (L) 07/24/2017 8016

## 2017-08-10 NOTE — Progress Notes (Signed)
Physical Therapy Session Note  Patient Details  Name: Hayden [name] MRN: [id number] Date of Birth: 1971/03/22  Today's Date: 08/10/2017 PT Individual Time: 1000-1100 and 1340-1425 PT Individual Time Calculation (min): 60 min and 45 min (total 105 min)   Short Term Goals: Week 1:  PT Short Term Goal 1 (Week 1): pt will perform lateral scoot transfer with supervision after set-up with slide board PT Short Term Goal 2 (Week 1): pt will propel w/c x 150' with supervision PT Short Term Goal 3 (Week 1): pt will tolerate wt bearing through L foot during assisted sit> stand, using Stedy PRN  Skilled Therapeutic Interventions/Progress Updates: Tx 1: Pt received seated in w/c, c/o pain 8/10 in LLE and agreeable to treatment. W/c propulsion to gym BUE x150' with S; occasional rest breaks d/t fatigue. Donned RLE prosthetic with modA; required standing on prosthetic in order to fully seat residual limb into socket. Able to perform two trials into full standing with min/modA and BUE on parallel bars; unable to tolerate standing >5 seconds d/t LLE pain. Lateral scoot with transfer board w/c <>mat table with min guard. Supine UE exercises including chest press and tricep extension (12.5# weighted bar), pec fly (4# hand weights BUE). Prone lying x10 min total for passive hip flexor/lumbar extension stretch while performing BUE scapular retraction in "T" for focus on middle traps/rhomboids, BLE hip extension (AAROM RLE d/t prosthetic), sidelying hip abduction. All exercises 2x10-15 reps per tolerance. Transfer back to w/c close S with transfer board. W/c propulsion to return to room x150'; pushed totalA remaining distance for energy conservation. Remained seated in w/c at end of session, mother in law present and all needs in reach.   Tx 2: Pt received supine in bed, denies pain but reports significant fatigue and requests to perform bed level exercises "as long as it wont set me back". Performed BLE SLR 2x15 reps  (prosthetic on RLE), 2x15 semi-reclined crunches. Supine>sit with S. Bicep curls (4# hand weights), shoulder flexion front raises, lateral raises (3# weights) 2x15 each. RLE prosthetic removed; educated pt on frequent skin checks as skin gets used to prosthetic wear. No hot spots noted. Returned to supine d/t worsening pain in LLE in dependent position. Provided pt with handouts of supine/sidelying/prone exercises and educated on performance independently to allow therapy sessions to focus on standing and prosthetic training; pt agreeable to perform exercises independently. Reviewed importance of maintaining LE ROM and strength to prepare for prosthetic use. Remained in bed at end of session, all needs in reach.      Therapy Documentation Precautions:  Precautions Precautions: Fall Precaution Comments: pain in left LE Restrictions Weight Bearing Restrictions: No RLE Weight Bearing: Weight bearing as tolerated  See Function Navigator for Current Functional Status.   Therapy/Group: Individual Therapy  Luberta Mutter 08/10/2017, 11:00 AM

## 2017-08-10 NOTE — Progress Notes (Signed)
Occupational Therapy Session Note  Patient Details  Name: Hayden [name] MRN: [id number] Date of Birth: [birth year]  Today's Date: 08/10/2017 OT Individual Time: 1300-1330 OT Individual Time Calculation (min): 30 min    Short Term Goals: Week 1:  OT Short Term Goal 1 (Week 1): Pt will transfer to BSC/ toilet with min A  OT Short Term Goal 2 (Week 1): Pt will thread LB clothing with setup  OT Short Term Goal 3 (Week 1): Pt will tolerate left LE being dependent in prep for transfer/ ADL task OT Short Term Goal 4 (Week 1): Pt will perform bed mobility with mod I in prep for ADL tasks   Skilled Therapeutic Interventions/Progress Updates:    1:1. Pt seated in w/c requesting new leg rest for w/c as pt now has prosthetic for RLE. Pt propels w/c to gym with encouragement to not take a rest break to improve BUE endurance. OT selects and adjusts new leg rest to appropriate height for RLE. Pt returns to room in same manner as above. Pt reporting 8/10 pain in LLE. RN alerted and also alerted to L arm needing new dressing as pt bleeding through bandage. Pt slide board transfer to R with set up of board and VC for head hips relationship/hand placement. Pt supine in bed while participating in guided imagery to reduce pain/anxiety. After meditation pain reported to be at 7/10, but pt states, "if I do that a few more times it would really help my chi." Exited session with pt supine in bed and RN entering room.  Therapy Documentation Precautions:  Precautions Precautions: Fall Precaution Comments: pain in left LE Restrictions Weight Bearing Restrictions: No RLE Weight Bearing: Weight bearing as tolerated General:   Vital Signs: Therapy Vitals Temp: 98.2 F (36.8 C) Temp Source: Oral Pulse Rate: 64 Resp: 17 BP: 140/76 Patient Position (if appropriate): Lying Oxygen Therapy SpO2: 100 % O2 Device: Not Delivered  See Function Navigator for Current Functional Status.   Therapy/Group: Individual  Therapy  Tonny Branch 08/10/2017, 5:08 PM

## 2017-08-11 ENCOUNTER — Inpatient Hospital Stay (HOSPITAL_COMMUNITY): Payer: Self-pay | Admitting: Physical Therapy

## 2017-08-11 ENCOUNTER — Inpatient Hospital Stay (HOSPITAL_COMMUNITY): Payer: Self-pay

## 2017-08-11 LAB — CBC
HCT: 36 % — ABNORMAL LOW (ref 39.0–52.0)
HEMOGLOBIN: 11.4 g/dL — AB (ref 13.0–17.0)
MCH: 28.1 pg (ref 26.0–34.0)
MCHC: 31.7 g/dL (ref 30.0–36.0)
MCV: 88.7 fL (ref 78.0–100.0)
Platelets: 388 10*3/uL (ref 150–400)
RBC: 4.06 MIL/uL — AB (ref 4.22–5.81)
RDW: 18.1 % — ABNORMAL HIGH (ref 11.5–15.5)
WBC: 12.5 10*3/uL — AB (ref 4.0–10.5)

## 2017-08-11 LAB — GLUCOSE, CAPILLARY
Glucose-Capillary: 81 mg/dL (ref 65–99)
Glucose-Capillary: 112 mg/dL — ABNORMAL HIGH (ref 65–99)

## 2017-08-11 LAB — RENAL FUNCTION PANEL
ALBUMIN: 2.5 g/dL — AB (ref 3.5–5.0)
ANION GAP: 19 — AB (ref 5–15)
BUN: 53 mg/dL — ABNORMAL HIGH (ref 6–20)
CO2: 24 mmol/L (ref 22–32)
Calcium: 9.7 mg/dL (ref 8.9–10.3)
Chloride: 92 mmol/L — ABNORMAL LOW (ref 101–111)
Creatinine, Ser: 8.18 mg/dL — ABNORMAL HIGH (ref 0.61–1.24)
GFR calc non Af Amer: 7 mL/min — ABNORMAL LOW (ref >60)
GFR, EST AFRICAN AMERICAN: 8 mL/min — AB (ref >60)
Glucose, Bld: 80 mg/dL (ref 65–99)
PHOSPHORUS: 3.6 mg/dL (ref 2.5–4.6)
Potassium: 4.7 mmol/L (ref 3.5–5.1)
SODIUM: 135 mmol/L (ref 135–145)

## 2017-08-11 MED ORDER — HEPARIN SODIUM (PORCINE) 1000 UNIT/ML DIALYSIS
4200.0000 [IU] | Freq: Once | INTRAMUSCULAR | Status: DC
  Filled 2017-08-11: qty 5

## 2017-08-11 NOTE — Progress Notes (Signed)
Physical Therapy Session Note  Patient Details  Name: Hayden [name] MRN: [id number] Date of Birth: 1971-05-03  Today's Date: 08/11/2017 PT Individual Time: 0800-0900 1100-1200 PT Individual Time Calculation (min): 60 min + 60 min 157min total  Short Term Goals: Week 1:  PT Short Term Goal 1 (Week 1): pt will perform lateral scoot transfer with supervision after set-up with slide board PT Short Term Goal 2 (Week 1): pt will propel w/c x 150' with supervision PT Short Term Goal 3 (Week 1): pt will tolerate wt bearing through L foot during assisted sit> stand, using Stedy PRN  Skilled Therapeutic Interventions/Progress Updates:    Tx 1: Pt was greeted lying supine in bed, agreeable to therapy, reports soreness "all over" and requests to "skip prosthetic training today" Pt performs lateral scoot transfer using transfer board with minA for board placement and stabilization. Pt LLE painful in dependent position. Pt propels manual WC using BUE 55ft x 2 , performs 47mins on arm bike level 8 initially, decreased to level 6 per pt tolerance. Pt transfers WC<>mat using sliding board with min guard, performs 2 x 12 sets bridge with towel roll under BLE with pt unable to clear hips from mat, LLE adduction bridge on bolster. Pt propels manual WC approximately 116ft back to room using BUE, excessive time required to complete task, with supervision/minA to avoid hitting cart on R, vcing to push harder with RUE to maintain straight path. At end of session, pt left upright in manual WC, mother-in-law present, denies any needs at this time.  Tx 2: Pt was greeted sitting upright in manual WC, son present throughout session, agreeable to therapy. Pt transferred WC<>bed using sliding board with set-up assistance, son assisted pt in donning prosthetic liner in supine, pt states it usually takes a few hours for the liner to shrink his residual limb to fit his prosthetic limb socket. Pt propels WC to therapy gym with  increased time using BUE, with supervision for steering and directional cues. In parallel bars, pt attempts sit<>stand but reports unable to bear weight through LLE d/t pain. Performed anterior weight shift activity in sitting with L foot on 4 inch step, lowered to 2 inch step to emphasize pt tolerance to weight-bearing on LLE. Attempted to don prosthetic limb on R, however LLE still too painful to WB in order to fully don prosthetic. Instructed son in car transfer using slideboard, moderate vcing for placement and technique throughout transfer. At end of session, pt's son wheeled him back to room, left sitting upright in WC to eat lunch, denies any needs at this time.   Therapy Documentation Precautions:  Precautions Precautions: Fall Precaution Comments: pain in left LE Restrictions Weight Bearing Restrictions: No RLE Weight Bearing: Weight bearing as tolerated Vital Signs:    See Function Navigator for Current Functional Status.   Therapy/Group: Individual Therapy  Salvatore Decent 08/11/2017, 1:02 PM

## 2017-08-11 NOTE — Plan of Care (Signed)
  Progressing RH BOWEL ELIMINATION RH STG MANAGE BOWEL WITH ASSISTANCE Description STG Manage Bowel with  [place].  08/11/2017 0027 - Progressing by Gwendolyn Grant, RN Note Large BM per patient's wife. 08/11/2017 0024 - Progressing by Gwendolyn Grant, RN Note Large BM per patient's wife 2/10 RH PAIN MANAGEMENT RH STG PAIN MANAGED AT OR BELOW PT'S PAIN GOAL Description Less than 5  08/11/2017 0027 - Progressing by Gwendolyn Grant, RN 08/11/2017 0024 - Progressing by Gwendolyn Grant, RN RH KNOWLEDGE DEFICIT GENERAL RH STG INCREASE KNOWLEDGE OF SELF CARE AFTER HOSPITALIZATION Description Increase awareness and knowledge of self-care after discharge with min assist  08/11/2017 0027 - Progressing by Gwendolyn Grant, RN 08/11/2017 0024 - Progressing by Gwendolyn Grant, RN

## 2017-08-11 NOTE — Progress Notes (Signed)
Occupational Therapy Session Note  Patient Details  Name: Hayden [name] MRN: [id number] Date of Birth: [birth year]  Today's Date: 08/11/2017 OT Individual Time: 1000-1100 OT Individual Time Calculation (min): 60 min    Short Term Goals: Week 1:  OT Short Term Goal 1 (Week 1): Pt will transfer to BSC/ toilet with min A  OT Short Term Goal 2 (Week 1): Pt will thread LB clothing with setup  OT Short Term Goal 3 (Week 1): Pt will tolerate left LE being dependent in prep for transfer/ ADL task OT Short Term Goal 4 (Week 1): Pt will perform bed mobility with mod I in prep for ADL tasks   Skilled Therapeutic Interventions/Progress Updates:    OT intervention with focus on tub bench transfers and BUE therex to increase independence with BADLs and functional transfers.  Pt's son present for therapy session.  Pt practiced tub bench transfer with slide board at supervision level (assistance to hold board during transfer). Pt engaged in BUE therex with 2kg weight ball for PNF patterns and 5# weight bar tapping beach ball when tossed.  Pt returned to room and remained in w/c with all needs within reach.   Therapy Documentation Precautions:  Precautions Precautions: Fall Precaution Comments: pain in left LE Restrictions Weight Bearing Restrictions: No RLE Weight Bearing: Weight bearing as tolerated  Pain: Pain Assessment Pain Score: 4  Faces Pain Scale: Hurts little more PAINAD (Pain Assessment in Advanced Dementia) Breathing: normal Negative Vocalization: occasional moan/groan, low speech, negative/disapproving quality Facial Expression: facial grimacing Body Language: tense, distressed pacing, fidgeting Consolability: distracted or reassured by voice/touch PAINAD Score: 5  See Function Navigator for Current Functional Status.   Therapy/Group: Individual Therapy  Leroy Libman 08/11/2017, 11:01 AM

## 2017-08-11 NOTE — Progress Notes (Signed)
PHYSICAL MEDICINE & REHABILITATION     PROGRESS NOTE    Subjective/Complaints: Had a pretty good weekend. Needing less pain medication. Mother notices that he's gained weight.   ROS: pt denies nausea, vomiting, diarrhea, cough, shortness of breath or chest pain   Objective: Vital Signs: Blood pressure (!) 152/84, pulse 79, temperature (!) 97.5 F (36.4 C), temperature source Oral, resp. rate 18, weight 105.7 kg (233 lb), SpO2 95 %. No results found. Recent Labs    08/08/17 1159  WBC 10.2  HGB 11.4*  HCT 37.2*  PLT 364   Recent Labs    08/08/17 1330  NA 134*  K 4.6  CL 94*  GLUCOSE 87  BUN 34*  CREATININE 6.96*  CALCIUM 9.8   CBG (last 3)  Recent Labs    08/10/17 0702 08/11/17 0647  GLUCAP 96 81    Wt Readings from Last 3 Encounters:  08/11/17 105.7 kg (233 lb)  08/04/17 96 kg (211 lb 10.3 oz)  06/03/17 96.6 kg (213 lb)    Physical Exam:  Constitutional: He appears well-developed and well-nourished.  HENT:  Head: Normocephalic.  Eyes: Pupils are equal, round, and reactive to light.  Neck: Normal range of motion.  Cardiovascular: RRR without murmur. No JVD    Chest: CTA Bilaterally without wheezes or rales. Normal effort  GI: Soft. He exhibits no distension. There is no tenderness. Abdomen a little more protuberant Musculoskeletal: He exhibits tr+ edema.  Neurological: No cranial nerve deficit.  Bilateral UE's 4/5. RLE 4-/5 HF. LLE 3 to 3+/5 with limitations due to pain. Limited insight and awareness.  Skin:  Tattoos, LLE with erythema, eschar around healing wound, dressed.   scabs on left temple and chest   Psychiatric:  cooperative   Assessment/Plan: 1. 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 Physiatrist is providing close team supervision and 24 hour management of active medical problems listed below. Physiatrist and  rehab team continue to assess barriers to discharge/monitor patient progress toward functional and medical goals.  Function:  Bathing Bathing position   Position: Bed  Bathing parts Body parts bathed by patient: Right arm, Left arm, Chest, Abdomen, Front perineal area, Right upper leg, Left upper leg, Back Body parts bathed by helper: Back  Bathing assist Assist Level: Supervision or verbal cues, Set up   Set up : To obtain items  Upper Body Dressing/Undressing Upper body dressing   What is the patient wearing?: Pull over shirt/dress     Pull over shirt/dress - Perfomed by patient: Thread/unthread right sleeve, Thread/unthread left sleeve, Put head through opening, Pull shirt over trunk          Upper body assist Assist Level: Supervision or verbal cues      Lower Body Dressing/Undressing Lower body dressing   What is the patient wearing?: Non-skid slipper socks     Pants- Performed by patient: Thread/unthread right pants leg, Thread/unthread left pants leg Pants- Performed by helper: Pull pants up/down Non-skid slipper socks- Performed by patient: Don/doff left sock                    Lower body assist Assist for lower body dressing: Touching or steadying assistance (Pt > 75%)      Toileting Toileting Toileting activity did not occur: No continent bowel/bladder event   Toileting steps completed by helper: Adjust clothing prior to toileting, Performs perineal hygiene, Adjust clothing after  toileting(per Haskell Flirt Aquit, NT report)    Toileting assist Assist level: Touching or steadying assistance (Pt.75%)(per Haskell Flirt Aquit, NT report)   Transfers Chair/bed transfer   Chair/bed transfer method: Lateral scoot Chair/bed transfer assist level: Touching or steadying assistance (Pt > 75%) Chair/bed transfer assistive device: Sliding board, Armrests     Locomotion Ambulation Ambulation activity did not occur: Safety/medical concerns(unable to tolerate wt  bearing LLE)         Wheelchair   Type: Manual Max wheelchair distance: 150 Assist Level: Supervision or verbal cues  Cognition Comprehension Comprehension assist level: Understands basic 90% of the time/cues < 10% of the time  Expression Expression assist level: Expresses basic needs/ideas: With extra time/assistive device, Expresses basic needs/ideas: With no assist  Social Interaction Social Interaction assist level: Interacts appropriately 90% of the time - Needs monitoring or encouragement for participation or interaction.  Problem Solving Problem solving assist level: Solves basic 90% of the time/requires cueing < 10% of the time  Memory Memory assist level: Recognizes or recalls 90% of the time/requires cueing < 10% of the time   Medical Problem List and Plan:  1. Functional and mobility deficits secondary to debility and cardio-embolic CVA/encephalopathy  -continue therapies 2. Anticoagulation: Pharmaceutical: Coumadin therapeutic. Should stay on medication for stroke prophylaxis  3. Chronic back pain/Pain Management:   Oxycodone  prn during the day with gabapentin bid.      -continue oxy CR 10mg  q 12--    -using less breakthrough medication    -continue with other techniques for pain relief including relaxation, tactile feedback   -physical therapy working on strategies with him as well. 4. Bipolar disorder/Mood: continue Lexapro and depakote. LCSW to follow for evaluation and support.  5. Neuropsych: This patient is not capable of making decisions on his own behalf.  6. Skin/Wound Care: recent bx with vasculitis c/w calciphylaxis  -renvela/thiosulfate with HDper renal recs  -pain mgt  -cover left leg daily with non-adherent dressing. Can remove at night 7. Fluids/Electrolytes/Nutrition: strict I/O. Renal diet with 1200 cc FR/day.   8. CAD/Systolic CHF with severe AS, MS/a-flutter: deemed non-operative candidate  -Treated medically with ASA, Lipitor, Coreg and Isordil   -daily weights, volume mgt  -palliative care consult for goals of care was reviewed  9. ESRD: Continue HD MWF. Scheduled at end of the day to help with activity tolerance.     -volume mg per renal Filed Weights   08/09/17 0411 08/10/17 0519 08/11/17 0552  Weight: 95 kg (209 lb 7 oz) 94.8 kg (209 lb) 105.7 kg (233 lb)    10. HTN: Monitor BID. Continue Apresoline, Avapro, Norvasc, Coreg, Catapres and Isordil.  11. Tobacco abuse: Continue nicotine patch.  12 . H/o Seizure disorder: On Depakote.  13. Neuropathy: continue gabapentin bid.  14. Anemia of chronic disease: Stable.  15. T2DM: Diet controlled at present    -Continue to monitor BS ac/hs. ---controlled at present   -Use SSI  PRN   LOS (Days) 6 A FACE TO FACE EVALUATION WAS PERFORMED  Meredith Staggers, MD 08/11/2017 8:18 AM

## 2017-08-11 NOTE — Plan of Care (Signed)
  Consults Skin Care Protocol Initiated - if Braden Score 18 or less Description Patient and wife will manage skin with mod I at home  08/11/2017 1455 - Progressing by Dietrich Pates, RN [facility] GENERAL PATIENT EDUCATION Description See Patient Education module for education specifics. 08/11/2017 1455 - Progressing by Dietrich Pates, RN   Consults [facility] GENERAL PATIENT EDUCATION Description See Patient Education module for education specifics. 08/11/2017 1455 - Progressing by Dietrich Pates, RN   RH SKIN INTEGRITY RH STG SKIN FREE OF INFECTION/BREAKDOWN Description Prevent further breakdown with max assist  08/11/2017 1455 - Progressing by Dietrich Pates, RN   RH PAIN MANAGEMENT RH STG PAIN MANAGED AT OR BELOW PT'S PAIN GOAL Description Less than 5  08/11/2017 1455 - Progressing by Dietrich Pates, RN   RH KNOWLEDGE DEFICIT GENERAL RH STG INCREASE KNOWLEDGE OF SELF CARE AFTER HOSPITALIZATION Description Increase awareness and knowledge of self-care after discharge with min assist  08/11/2017 1455 - Progressing by Dietrich Pates, RN

## 2017-08-11 NOTE — Progress Notes (Signed)
[facility] ROUNDING NOTE   Subjective:   End stage renal disease seen with no complaints  MWF dialysis. 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 He has severe Aortic stenosis and not a surgical candidate  He has history of multiple infarcts and also coronary artery disease He also has atrial flutter and is currently on eliquis     Objective:  Vital signs in last 24 hours:  Temp:  [97.5 F (36.4 C)-98.2 F (36.8 C)] 97.5 F (36.4 C) (02/11 0552) Pulse Rate:  [64-79] 79 (02/11 0552) Resp:  [17-18] 18 (02/11 0552) BP: (140-161)/(75-84) 152/84 (02/11 0552) SpO2:  [95 %-100 %] 95 % (02/11 0552) Weight:  [233 lb (105.7 kg)] 233 lb (105.7 kg) (02/11 0552)  Weight change: 24 lb (10.9 kg) Filed Weights   08/09/17 0411 08/10/17 0519 08/11/17 0552  Weight: 209 lb 7 oz (95 kg) 209 lb (94.8 kg) 233 lb (105.7 kg)    Intake/Output: I/O last 3 completed shifts: In: 1320 [P.O.:1320] Out: -    Intake/Output this shift:  No intake/output data recorded.  Alert, pleasant, nad No jvd Chest cta bilat RRR 3/6 holosyst M Abd soft ntnd obese Ext serpentine skin necrosis R shin/ calf LUA AVF NF, ox3     Basic Metabolic Panel: Recent Labs  Lab 08/05/17 0820 08/06/17 1630 08/08/17 1330  NA 133* 133* 134*  K 4.0 4.8 4.6  CL 93* 91* 94*  CO2 22 24 26   GLUCOSE 97 87 87  BUN 24* 38* 34*  CREATININE 5.66* 7.51* 6.96*  CALCIUM 9.4 9.7 9.8  PHOS 5.0* 6.2* 4.8*    Liver Function Tests: Recent Labs  Lab 08/05/17 0820 08/06/17 1630 08/08/17 1330  ALBUMIN 2.5* 2.6* 2.5*   No results for input(s): LIPASE, AMYLASE in the last 168 hours. No results for input(s): AMMONIA in the last 168 hours.  CBC: Recent Labs  Lab 08/05/17 0820 08/06/17 1630 08/08/17 1159  WBC 11.5* 10.9* 10.2  HGB 11.2* 11.1* 11.4*  HCT 35.9* 35.7* 37.2*  MCV 88.4 89.0 90.5  PLT 359 367 364    Cardiac Enzymes: No results for input(s): CKTOTAL, CKMB, CKMBINDEX, TROPONINI in the last  168 hours.  BNP: Invalid input(s): POCBNP  CBG: Recent Labs  Lab 08/06/17 0636 08/06/17 1147 08/07/17 0652 08/10/17 0702 08/11/17 0647  GLUCAP 75 108* 87 96 81    Microbiology: Results for orders placed or performed during the hospital encounter of 07/19/17  Blood culture (routine x 2)     Status: None   Collection Time: 07/19/17 11:25 PM  Result Value Ref Range Status   Specimen Description BLOOD RIGHT ARM  Final   Special Requests   Final    BOTTLES DRAWN AEROBIC AND ANAEROBIC Blood Culture adequate volume   Culture NO GROWTH 5 DAYS  Final   Report Status 07/25/2017 FINAL  Final  Blood culture (routine x 2)     Status: None   Collection Time: 07/19/17 11:30 PM  Result Value Ref Range Status   Specimen Description BLOOD RIGHT HAND  Final   Special Requests   Final    BOTTLES DRAWN AEROBIC ONLY Blood Culture adequate volume   Culture NO GROWTH 5 DAYS  Final   Report Status 07/25/2017 FINAL  Final  MRSA PCR Screening     Status: None   Collection Time: 08/01/17 11:53 PM  Result Value Ref Range Status   MRSA by PCR NEGATIVE NEGATIVE Final    Comment:  The GeneXpert MRSA Assay (FDA approved for NASAL specimens only), is one component of a comprehensive MRSA colonization surveillance program. It is not intended to diagnose MRSA infection nor to guide or monitor treatment for MRSA infections. Performed at [facility], [place] [address], [place], [place] [address]     Coagulation Studies: No results for input(s): LABPROT, INR in the last 72 hours.  Urinalysis: No results for input(s): COLORURINE, LABSPEC, PHURINE, GLUCOSEU, HGBUR, BILIRUBINUR, KETONESUR, PROTEINUR, UROBILINOGEN, NITRITE, LEUKOCYTESUR in the last 72 hours.  Invalid input(s): APPERANCEUR    Imaging: No results found.   Medications:   . sodium thiosulfate infusion for calciphylaxis     . acetaminophen  650 mg Oral TID WC & HS  . amLODipine  10 mg Oral Daily  . apixaban  5 mg  Oral BID  . aspirin  81 mg Oral Daily  . atorvastatin  80 mg Oral q1800  . carvedilol  25 mg Oral BID WC  . cinacalcet  90 mg Oral Q breakfast  . [START ON 08/12/2017] cloNIDine  0.2 mg Transdermal Q Tue  . collagenase   Topical Daily  . divalproex  500 mg Oral Q12H  . escitalopram  10 mg Oral Daily  . ferric citrate  630 mg Oral TID WC  . fluticasone  1 spray Each Nare BID  . gabapentin  100 mg Oral BID  . hydrALAZINE  100 mg Oral Q8H  . hydrocerin   Topical TID  . irbesartan  300 mg Oral Daily  . isosorbide dinitrate  10 mg Oral BID  . lidocaine   Topical QID  . multivitamin  1 tablet Oral QHS  . nicotine  21 mg Transdermal Daily  . oxyCODONE  10 mg Oral Q12H  . sevelamer carbonate  2,400 mg Oral TID WC  . sodium chloride flush  3 mL Intravenous Q12H   acetaminophen, aluminum hydroxide, bisacodyl, camphor-menthol, diphenhydrAMINE, guaiFENesin-dextromethorphan, nitroGLYCERIN, oxyCODONE, polyethylene glycol, prochlorperazine **OR** prochlorperazine **OR** prochlorperazine, senna-docusate, sevelamer carbonate, sodium chloride flush, sodium phosphate  Assessment/ Plan:  Dialysis: MWF NW 4h  98kg (lower at dc) 2/2.25 bath  LUA AVF   Hep 2400 - Hectoral 26mcg IV q HD(D/c'd) - Venofer 100mg  x 10 ordered (4 given) - Mircera 211mcg IV q 2 weeks (last 1/18) - Na thiosulfate 25 gm QHD started this hospitalization -home BP-norvasc 10/ coreg 25 bid/ clon patch 0.2 / hydral 100 tid/ avapro 300      Impression: 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 3  CM/ severe AS, not surg candidate 4  CVA mult, small 5  Aflutter, changing to eliquis ,renal dose per pharm (for #1) 6  HTN mult meds, bp ok 7  Vol under dry, lower edw at dc 8  AMS resolved 9  PVD 10  DM2 11  Bipolar 12  Anemia ckd, Hb good, dc'd Fe load d/t #1 13  CAD sp cath 2V CAD, med Rx 14  EOL - DNR now    LOS: 6 Hayden [name] W @TODAY @8 :45 AM

## 2017-08-12 ENCOUNTER — Inpatient Hospital Stay (HOSPITAL_COMMUNITY): Payer: Self-pay

## 2017-08-12 ENCOUNTER — Inpatient Hospital Stay (HOSPITAL_COMMUNITY): Payer: Medicare Other

## 2017-08-12 ENCOUNTER — Inpatient Hospital Stay (HOSPITAL_COMMUNITY): Payer: Self-pay | Admitting: Physical Therapy

## 2017-08-12 LAB — GLUCOSE, CAPILLARY: GLUCOSE-CAPILLARY: 114 mg/dL — AB (ref 65–99)

## 2017-08-12 NOTE — Progress Notes (Signed)
[place] PHYSICAL MEDICINE & REHABILITATION     PROGRESS NOTE    Subjective/Complaints: Left ankle sore this morning.  He is consciously trying to take less oxycodone.  Feels that pain is moving further down her leg as before.  ROS: pt denies nausea, vomiting, diarrhea, cough, shortness of breath or chest pain   Objective: Vital Signs: Blood pressure (!) 170/96, pulse 78, temperature 98.9 F (37.2 C), temperature source Oral, resp. rate 16, weight 103.1 kg (227 lb 4.7 oz), SpO2 91 %. No results found. Recent Labs    08/11/17 1650  WBC 12.5*  HGB 11.4*  HCT 36.0*  PLT 388   Recent Labs    08/11/17 1620  NA 135  K 4.7  CL 92*  GLUCOSE 80  BUN 53*  CREATININE 8.18*  CALCIUM 9.7   CBG (last 3)  Recent Labs    08/11/17 0647 08/11/17 2117 08/12/17 0704  GLUCAP 81 112* 114*    Wt Readings from Last 3 Encounters:  08/12/17 103.1 kg (227 lb 4.7 oz)  08/04/17 96 kg (211 lb 10.3 oz)  06/03/17 96.6 kg (213 lb)    Physical Exam:  Constitutional: He appears well-developed and well-nourished.  HENT:  Head: Normocephalic.  Eyes: Pupils are equal, round, and reactive to light.  Neck: Normal range of motion.  Cardiovascular: RRR with murmur. No JVD    Chest: CTA Bilaterally without wheezes or rales. Normal effort   GI: Soft. He exhibits no distension. There is no tenderness. Abdomen a little more protuberant Musculoskeletal: He exhibits tr+ edema.  Neurological: No cranial nerve deficit.  Bilateral UE's 4/5. RLE 4-/5 HF. LLE 3 to 3+/5 with limitations due to pain. Limited insight and awareness.  Skin:  Tattoos, LLE erythema/eschar stable to decreased Psychiatric:  cooperative   Assessment/Plan: 1. 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 Physiatrist is providing close team supervision and 24 hour management of active medical problems listed  below. Physiatrist and rehab team continue to assess barriers to discharge/monitor patient progress toward functional and medical goals.  Function:  Bathing Bathing position   Position: Bed  Bathing parts Body parts bathed by patient: Right arm, Left arm, Chest, Abdomen, Front perineal area, Right upper leg, Left upper leg, Back Body parts bathed by helper: Back  Bathing assist Assist Level: Supervision or verbal cues, Set up   Set up : To obtain items  Upper Body Dressing/Undressing Upper body dressing   What is the patient wearing?: Pull over shirt/dress     Pull over shirt/dress - Perfomed by patient: Thread/unthread right sleeve, Thread/unthread left sleeve, Put head through opening, Pull shirt over trunk          Upper body assist Assist Level: Supervision or verbal cues      Lower Body Dressing/Undressing Lower body dressing   What is the patient wearing?: Non-skid slipper socks     Pants- Performed by patient: Thread/unthread right pants leg, Thread/unthread left pants leg Pants- Performed by helper: Pull pants up/down Non-skid slipper socks- Performed by patient: Don/doff left sock                    Lower body assist Assist for lower body dressing: Touching or steadying assistance (Pt > 75%)      Toileting Toileting Toileting activity did not occur: No continent bowel/bladder event   Toileting steps completed by helper: Adjust clothing prior to toileting, Performs perineal  hygiene, Adjust clothing after toileting(per [place], NT report)    Toileting assist Assist level: Touching or steadying assistance (Pt.75%)(per Haskell Flirt Aquit, NT report)   Transfers Chair/bed transfer   Chair/bed transfer method: Lateral scoot Chair/bed transfer assist level: Supervision or verbal cues Chair/bed transfer assistive device: Sliding board, Armrests     Locomotion Ambulation Ambulation activity did not occur: Safety/medical concerns          Wheelchair   Type: Manual Max wheelchair distance: 150 Assist Level: Touching or steadying assistance (Pt > 75%)  Cognition Comprehension Comprehension assist level: Understands basic 90% of the time/cues < 10% of the time  Expression Expression assist level: Expresses basic needs/ideas: With extra time/assistive device, Expresses basic needs/ideas: With no assist  Social Interaction Social Interaction assist level: Interacts appropriately 90% of the time - Needs monitoring or encouragement for participation or interaction.  Problem Solving Problem solving assist level: Solves basic 90% of the time/requires cueing < 10% of the time  Memory Memory assist level: Recognizes or recalls 90% of the time/requires cueing < 10% of the time   Medical Problem List and Plan:  1. Functional and mobility deficits secondary to debility and cardio-embolic CVA/encephalopathy  -continue therapies -Team conference today 2. Anticoagulation: Pharmaceutical: Coumadin therapeutic. Should stay on medication for stroke prophylaxis  3. Chronic back pain/Pain Management:   Oxycodone  prn during the day with gabapentin bid.      -continue oxy CR 10mg  q 12--    -using less breakthrough medication.  Encouraged him to use medication if he needs it, especially before therapies    -continue with other techniques for pain relief including relaxation, tactile feedback   -physical therapy working on strategies with him as well. 4. Bipolar disorder/Mood: continue Lexapro and depakote. LCSW to follow for evaluation and support.  5. Neuropsych: This patient is not capable of making decisions on his own behalf.  6. Skin/Wound Care: recent bx with vasculitis c/w calciphylaxis  -renvela/thiosulfate with HDper renal recs  -pain mgt  -cover left leg daily with non-adherent dressing. Can remove at night 7. Fluids/Electrolytes/Nutrition: strict I/O. Renal diet with 1200 cc FR/day.   8. CAD/Systolic CHF with severe AS,  MS/a-flutter: deemed non-operative candidate  -Treated medically with ASA, Lipitor, Coreg and Isordil  -daily weights, volume mgt ongoing -palliative care consult for goals of care was reviewed  9. ESRD: Continue HD MWF. Scheduled at end of the day to help with activity tolerance.     -volume mg per renal Filed Weights   08/11/17 1740 08/11/17 2119 08/12/17 0654  Weight: 102.7 kg (226 lb 6.6 oz) 103 kg (227 lb 1.2 oz) 103.1 kg (227 lb 4.7 oz)    10. HTN: Monitor BID. Continue Apresoline, Avapro, Norvasc, Coreg, Catapres and Isordil.  11. Tobacco abuse: Continue nicotine patch.  12 . H/o Seizure disorder: On Depakote.  13. Neuropathy: continue gabapentin bid.  14. Anemia of chronic disease: Stable.  15. T2DM: Diet controlled at present    -Continue to monitor BS ac/hs. --- remains controlled at present   -Use SSI  PRN   LOS (Days) 7 A FACE TO FACE EVALUATION WAS PERFORMED  Meredith Staggers, MD 08/12/2017 8:22 AM

## 2017-08-12 NOTE — Progress Notes (Signed)
Physical Therapy Session Note  Patient Details  Name: Hayden [name] MRN: [id number] Date of Birth: 1970/09/23  Today's Date: 08/12/2017 PT Individual Time: 0900-1000 PT Individual Time Calculation (min): 60 min   Short Term Goals: Week 1:  PT Short Term Goal 1 (Week 1): pt will perform lateral scoot transfer with supervision after set-up with slide board PT Short Term Goal 2 (Week 1): pt will propel w/c x 150' with supervision PT Short Term Goal 3 (Week 1): pt will tolerate wt bearing through L foot during assisted sit> stand, using Stedy PRN  Skilled Therapeutic Interventions/Progress Updates:    Pt greeted sitting upright in manual WC in room, mother-in-law present but not throughout session, pt reports LLE is very painful, consciously trying to take less pain medication, limiting participation with therapy however pt agreeable to session. Pt propels manual WC approximately 150' using BUE with supervision, increased time. Transfers WC<>mat using slide bpard with vcing for WC parts management and slide board transfer, min guard during transfer for safety. Utilized pressure map throughout session for visual for pressure relief, pt propels WC >500' to gift shop, increased pressure under R ischial tuberosity and sacrum as evidenced by green on pressure map, pt unable to clear buttocks using BUE, leaning to R he is able to clear L buttock, leaning to L pt is able to clear both buttocks, discussed technique for adequate pressure relief for him is to lean to L, pt expresses understanding, needs reinforcement for time to complete pressure relief. At end of session, pt returned to room dependently d/t fatigue, transfers WC>bed using slideboard with min guard, left in supine with all needs within reach.   Therapy Documentation Precautions:  Precautions Precautions: Fall Precaution Comments: pain in left LE Restrictions Weight Bearing Restrictions: No RLE Weight Bearing: Weight bearing as  tolerated General:   Vital Signs: Therapy Vitals Temp: 98.9 F (37.2 C) Temp Source: Oral Pulse Rate: 76 Resp: 16 BP: (!) 161/75 Patient Position (if appropriate): Sitting Oxygen Therapy SpO2: 91 % O2 Device: Not Delivered Pain: Pain Assessment Pain Assessment: 0-10 Pain Score: 6  Pain Location: Leg   See Function Navigator for Current Functional Status.   Therapy/Group: Individual Therapy  Salvatore Decent 08/12/2017, 10:20 AM

## 2017-08-12 NOTE — Progress Notes (Signed)
[facility] ROUNDING NOTE   Subjective:   End stage renal disease seen with no complaints  MWF dialysis. 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 He has severe Aortic stenosis and not a surgical candidate  He has history of multiple infarcts and also coronary artery disease He also has atrial flutter and is currently on eliquis     Objective:  Vital signs in last 24 hours:  Temp:  [97.7 F (36.5 C)-98.9 F (37.2 C)] 97.7 F (36.5 C) (02/12 1400) Pulse Rate:  [70-84] 70 (02/12 1400) Resp:  [16-18] 16 (02/12 0654) BP: (138-182)/(55-96) 149/79 (02/12 1400) SpO2:  [90 %-98 %] 90 % (02/12 1400) Weight:  [226 lb 6.6 oz (102.7 kg)-227 lb 4.7 oz (103.1 kg)] 227 lb 4.7 oz (103.1 kg) (02/12 0654)  Weight change: 1 lb 2.1 oz (0.512 kg) Filed Weights   08/11/17 1740 08/11/17 2119 08/12/17 0654  Weight: 226 lb 6.6 oz (102.7 kg) 227 lb 1.2 oz (103 kg) 227 lb 4.7 oz (103.1 kg)    Intake/Output: I/O last 3 completed shifts: In: 360 [P.O.:360] Out: 4000 [Other:4000]   Intake/Output this shift:  Total I/O In: -  Out: 300 [Urine:300]  Alert, pleasant, nad No jvd Chest cta bilat RRR 3/6 holosyst M Abd soft ntnd obese Ext serpentine skin necrosis R shin/ calf LUA AVF NF, ox3     Basic Metabolic Panel: Recent Labs  Lab 08/06/17 1630 08/08/17 1330 08/11/17 1620  NA 133* 134* 135  K 4.8 4.6 4.7  CL 91* 94* 92*  CO2 24 26 24   GLUCOSE 87 87 80  BUN 38* 34* 53*  CREATININE 7.51* 6.96* 8.18*  CALCIUM 9.7 9.8 9.7  PHOS 6.2* 4.8* 3.6    Liver Function Tests: Recent Labs  Lab 08/06/17 1630 08/08/17 1330 08/11/17 1620  ALBUMIN 2.6* 2.5* 2.5*   No results for input(s): LIPASE, AMYLASE in the last 168 hours. No results for input(s): AMMONIA in the last 168 hours.  CBC: Recent Labs  Lab 08/06/17 1630 08/08/17 1159 08/11/17 1650  WBC 10.9* 10.2 12.5*  HGB 11.1* 11.4* 11.4*  HCT 35.7* 37.2* 36.0*  MCV 89.0 90.5 88.7  PLT 367 364 388    Cardiac  Enzymes: No results for input(s): CKTOTAL, CKMB, CKMBINDEX, TROPONINI in the last 168 hours.  BNP: Invalid input(s): POCBNP  CBG: Recent Labs  Lab 08/07/17 0652 08/10/17 0702 08/11/17 0647 08/11/17 2117 08/12/17 0704  GLUCAP 87 96 81 112* 114*    Microbiology: Results for orders placed or performed during the hospital encounter of 07/19/17  Blood culture (routine x 2)     Status: None   Collection Time: 07/19/17 11:25 PM  Result Value Ref Range Status   Specimen Description BLOOD RIGHT ARM  Final   Special Requests   Final    BOTTLES DRAWN AEROBIC AND ANAEROBIC Blood Culture adequate volume   Culture NO GROWTH 5 DAYS  Final   Report Status 07/25/2017 FINAL  Final  Blood culture (routine x 2)     Status: None   Collection Time: 07/19/17 11:30 PM  Result Value Ref Range Status   Specimen Description BLOOD RIGHT HAND  Final   Special Requests   Final    BOTTLES DRAWN AEROBIC ONLY Blood Culture adequate volume   Culture NO GROWTH 5 DAYS  Final   Report Status 07/25/2017 FINAL  Final  MRSA PCR Screening     Status: None   Collection Time: 08/01/17 11:53 PM  Result Value Ref Range Status  MRSA by PCR NEGATIVE NEGATIVE Final    Comment:        The GeneXpert MRSA Assay (FDA approved for NASAL specimens only), is one component of a comprehensive MRSA colonization surveillance program. It is not intended to diagnose MRSA infection nor to guide or monitor treatment for MRSA infections. Performed at [facility], [place] [address] [place], [place] [address]     Coagulation Studies: No results for input(s): LABPROT, INR in the last 72 hours.  Urinalysis: No results for input(s): COLORURINE, LABSPEC, PHURINE, GLUCOSEU, HGBUR, BILIRUBINUR, KETONESUR, PROTEINUR, UROBILINOGEN, NITRITE, LEUKOCYTESUR in the last 72 hours.  Invalid input(s): APPERANCEUR    Imaging: No results found.   Medications:   . sodium thiosulfate infusion for calciphylaxis     .  acetaminophen  650 mg Oral TID WC & HS  . amLODipine  10 mg Oral Daily  . apixaban  5 mg Oral BID  . aspirin  81 mg Oral Daily  . atorvastatin  80 mg Oral q1800  . carvedilol  25 mg Oral BID WC  . cinacalcet  90 mg Oral Q breakfast  . cloNIDine  0.2 mg Transdermal Q Tue  . collagenase   Topical Daily  . divalproex  500 mg Oral Q12H  . escitalopram  10 mg Oral Daily  . ferric citrate  630 mg Oral TID WC  . fluticasone  1 spray Each Nare BID  . gabapentin  100 mg Oral BID  . heparin  4,200 Units Dialysis Once in dialysis  . hydrALAZINE  100 mg Oral Q8H  . hydrocerin   Topical TID  . irbesartan  300 mg Oral Daily  . isosorbide dinitrate  10 mg Oral BID  . lidocaine   Topical QID  . multivitamin  1 tablet Oral QHS  . nicotine  21 mg Transdermal Daily  . oxyCODONE  10 mg Oral Q12H  . sevelamer carbonate  2,400 mg Oral TID WC  . sodium chloride flush  3 mL Intravenous Q12H   acetaminophen, aluminum hydroxide, bisacodyl, camphor-menthol, diphenhydrAMINE, guaiFENesin-dextromethorphan, nitroGLYCERIN, oxyCODONE, polyethylene glycol, prochlorperazine **OR** prochlorperazine **OR** prochlorperazine, senna-docusate, sevelamer carbonate, sodium chloride flush, sodium phosphate  Assessment/ Plan:  Dialysis: MWF NW 4h  98kg (lower at dc) 2/2.25 bath  LUA AVF   Hep 2400 - Hectoral 65mcg IV q HD(D/c'd) - Venofer 100mg  x 10 ordered (4 given) - Mircera 236mcg IV q 2 weeks (last 1/18) - Na thiosulfate 25 gm QHD started this hospitalization -home BP-norvasc 10/ coreg 25 bid/ clon patch 0.2 / hydral 100 tid/ avapro 300      Impression: 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 3  CM/ severe AS, not surg candidate 4  CVA mult, small 5  Aflutter, changing to eliquis ,renal dose per pharm (for #1) 6  HTN mult meds, bp ok 7  Vol under dry, lower edw at dc 8  AMS resolved 9  PVD 10  DM2 11  Bipolar 12  Anemia ckd, Hb good, dc'd Fe load d/t #1 13  CAD sp cath 2V CAD, med Rx 14  EOL - DNR now     LOS: 7 Syanne Looney W @TODAY @2 :21 PM

## 2017-08-12 NOTE — Progress Notes (Signed)
Physical Therapy Session Note  Patient Details  Name: Hayden [name] MRN: [id number] Date of Birth: [birth year]  Today's Date: 08/12/2017 PT Individual Time: 6837-2902 PT Individual Time Calculation (min): 42 min   Short Term Goals: Week 1:  PT Short Term Goal 1 (Week 1): pt will perform lateral scoot transfer with supervision after set-up with slide board PT Short Term Goal 2 (Week 1): pt will propel w/c x 150' with supervision PT Short Term Goal 3 (Week 1): pt will tolerate wt bearing through L foot during assisted sit> stand, using Stedy PRN  Skilled Therapeutic Interventions/Progress Updates:    session focused on functional mobility including bed mobility and various transfers within pt tolerance to improve overall strength, endurance, and functional mobility in preparation for discharge. Initially attempted slideboard transfer but pt unable to tolerate LLE in dependent position  Despite various attempts and ultimately most successful with A/P transfers during session including bed -> w/c <> mat. Requires assist with LLE and sometimes R prosthesis during mobility activities which pt directed son to assist as able. Pt's son donned prosthesis prior to OOB per pt direction. On mat engaged in LLE mobility and ROM with focus on extension of knee and hip which pt reports as "excruciating" but able to tolerate x 7 reps with manual overpressure with rest breaks in flexion between trials. Educated on importance of movement and stretching and pt verbalized understanding. Pt requires cues throughout session for sequencing and set up of transfers. Handoff to RN at end of session.   Therapy Documentation Precautions:  Precautions Precautions: Fall Precaution Comments: pain in left LE Restrictions Weight Bearing Restrictions: No RLE Weight Bearing: Weight bearing as tolerated Pain: Pain in LLE and sacral area limiting mobility throughout session. Pt reports being premedicated. RN aware.  See  Function Navigator for Current Functional Status.   Therapy/Group: Individual Therapy  Canary Brim Ivory Broad, PT, DPT  08/12/2017, 12:07 PM

## 2017-08-12 NOTE — Progress Notes (Signed)
Physical Therapy Session Note  Patient Details  Name: Hayden [name] MRN: [id number] Date of Birth: 25-Jan-1971  Today's Date: 08/12/2017 PT Individual Time: 0800-0830 PT Individual Time Calculation (min): 30 min   Short Term Goals: Week 1:  PT Short Term Goal 1 (Week 1): pt will perform lateral scoot transfer with supervision after set-up with slide board PT Short Term Goal 2 (Week 1): pt will propel w/c x 150' with supervision PT Short Term Goal 3 (Week 1): pt will tolerate wt bearing through L foot during assisted sit> stand, using Stedy PRN  Skilled Therapeutic Interventions/Progress Updates: Pt presented in bed agreeable to therapy. Pt c/o pain in LLE and PTA noted wound not dressed. Per pt dressings not avail at moment. Pt able to instruct PTA in w/c placement and SB placement for transfer to w/c. Pt initiated SB transfer however required frequent breaks due to pain when placed in lowered position. Pt advised to increase lateral lean to increase head hips relationship and facilitate transfer. Pt able to complete transfer after several attempts with minA to min guard. LLE elevated with pillow placed under for support. Pt propelled to day room and participated in UBE L2.5 x 5 min for endurance. Pt then returned to room and agreeable to remain in w/c until next session in approx 30 min. Pt left with needs met and wife present.      Therapy Documentation Precautions:  Precautions Precautions: Fall Precaution Comments: pain in left LE Restrictions Weight Bearing Restrictions: No RLE Weight Bearing: Weight bearing as tolerated General:   Vital Signs: Therapy Vitals Pulse Rate: 76 BP: (!) 141/65 Patient Position (if appropriate): Sitting Pain: Pain Assessment Pain Assessment: 0-10 Pain Score: 5  Pain Location: Leg   See Function Navigator for Current Functional Status.   Therapy/Group: Individual Therapy  Brieonna Crutcher  Loralyn Rachel, PTA  08/12/2017, 12:27 PM

## 2017-08-12 NOTE — Care Management (Signed)
[facility] Individual Statement of Services  Patient Name:  BRAXTIN BAMBA  Date:  08/11/2017  Welcome to the [facility].  Our goal is to provide you with an individualized program based on your diagnosis and situation, designed to meet your specific needs.  With this comprehensive rehabilitation program, you will be expected to participate in at least 3 hours of rehabilitation therapies Monday-Friday, with modified therapy programming on the weekends.  Your rehabilitation program will include the following services:  Physical Therapy (PT), Occupational Therapy (OT), Speech Therapy (ST), 24 hour per day rehabilitation nursing, Therapeutic Recreaction (TR), Neuropsychology, Case Management (Social Worker), Rehabilitation Medicine, Nutrition Services and Pharmacy Services  Weekly team conferences will be held on Tuesdays to discuss your progress.  Your Social Worker will talk with you frequently to get your input and to update you on team discussions.  Team conferences with you and your family in attendance may also be held.  Expected length of stay: 14 days    Overall anticipated outcome: supervision @ wheelchair level  Depending on your progress and recovery, your program may change. Your Social Worker will coordinate services and will keep you informed of any changes. Your Social Worker's name and contact numbers are listed  below.  The following services may also be recommended but are not provided by the [facility] will be made to provide these services after discharge if needed.  Arrangements include referral to agencies that provide these services.  Your insurance has been verified to be:  Medicare and [facility] Your primary doctor is:  Dr. Marin Comment  Pertinent information will be shared with your doctor and your  insurance company.  Social Worker:  [place], [place] or [phone]   Information discussed with and copy given to patient by: Lennart Pall, 08/11/2017, 10:18 AM

## 2017-08-12 NOTE — Progress Notes (Signed)
Occupational Therapy Session Note  Patient Details  Name: Hayden [name] MRN: [id number] Date of Birth: 1970/11/10  Today's Date: 08/12/2017 OT Individual Time: 1300-1355 OT Individual Time Calculation (min): 55 min    Short Term Goals: Week 1:  OT Short Term Goal 1 (Week 1): Pt will transfer to BSC/ toilet with min A  OT Short Term Goal 2 (Week 1): Pt will thread LB clothing with setup  OT Short Term Goal 3 (Week 1): Pt will tolerate left LE being dependent in prep for transfer/ ADL task OT Short Term Goal 4 (Week 1): Pt will perform bed mobility with mod I in prep for ADL tasks   Skilled Therapeutic Interventions/Progress Updates:    Pt resting in w/c upon arrival.  Pt declined bathing/dressing today.  OT intervention with focus on functional transfers, ongoing family education, and BUE therex.  Pt performed A/P transfers w/c<>mat X 3 with pt directing care/assistance.  Pt's son present and assisted.  Pt performed PNF and theraband exercises sitting unsupported on mat before transferring back to w/c.  Pt transitioned to Day Room and engaged in hitting beach ball with 5# weight bar before switching to SciFit (5 mins X 2 with rest break). Pt returned to room and transferred back to bed at [place] level.  Pt remained in bed with family present.   Therapy Documentation Precautions:  Precautions Precautions: Fall Precaution Comments: pain in left LE Restrictions Weight Bearing Restrictions: No RLE Weight Bearing: Weight bearing as tolerated Pain: Pain Assessment Pain Assessment: 0-10 Pain Score: 7  Pain Type: Acute pain Pain Location: Leg Pain Orientation: Left Pain Descriptors / Indicators: Aching Pain Frequency: Constant Pain Onset: On-going Pain Intervention(s): Medication (See eMAR)  See Function Navigator for Current Functional Status.   Therapy/Group: Individual Therapy  Leroy Libman 08/12/2017, 3:03 PM

## 2017-08-13 ENCOUNTER — Inpatient Hospital Stay (HOSPITAL_COMMUNITY): Payer: Self-pay

## 2017-08-13 ENCOUNTER — Inpatient Hospital Stay (HOSPITAL_COMMUNITY): Payer: Self-pay | Admitting: Physical Therapy

## 2017-08-13 ENCOUNTER — Inpatient Hospital Stay (HOSPITAL_COMMUNITY): Payer: Self-pay | Admitting: *Deleted

## 2017-08-13 ENCOUNTER — Inpatient Hospital Stay (HOSPITAL_COMMUNITY): Payer: Medicare Other

## 2017-08-13 LAB — CBC
HCT: 36 % — ABNORMAL LOW (ref 39.0–52.0)
HEMOGLOBIN: 11.2 g/dL — AB (ref 13.0–17.0)
MCH: 27.9 pg (ref 26.0–34.0)
MCHC: 31.1 g/dL (ref 30.0–36.0)
MCV: 89.8 fL (ref 78.0–100.0)
PLATELETS: 343 10*3/uL (ref 150–400)
RBC: 4.01 MIL/uL — AB (ref 4.22–5.81)
RDW: 17.7 % — ABNORMAL HIGH (ref 11.5–15.5)
WBC: 11.1 10*3/uL — AB (ref 4.0–10.5)

## 2017-08-13 LAB — GLUCOSE, CAPILLARY
GLUCOSE-CAPILLARY: 89 mg/dL (ref 65–99)
Glucose-Capillary: 69 mg/dL (ref 65–99)

## 2017-08-13 LAB — RENAL FUNCTION PANEL
ANION GAP: 17 — AB (ref 5–15)
Albumin: 2.7 g/dL — ABNORMAL LOW (ref 3.5–5.0)
BUN: 55 mg/dL — ABNORMAL HIGH (ref 6–20)
CALCIUM: 9.4 mg/dL (ref 8.9–10.3)
CHLORIDE: 93 mmol/L — AB (ref 101–111)
CO2: 22 mmol/L (ref 22–32)
CREATININE: 7.67 mg/dL — AB (ref 0.61–1.24)
GFR, EST AFRICAN AMERICAN: 9 mL/min — AB (ref >60)
GFR, EST NON AFRICAN AMERICAN: 8 mL/min — AB (ref >60)
Glucose, Bld: 99 mg/dL (ref 65–99)
Phosphorus: 3.7 mg/dL (ref 2.5–4.6)
Potassium: 4.5 mmol/L (ref 3.5–5.1)
Sodium: 132 mmol/L — ABNORMAL LOW (ref 135–145)

## 2017-08-13 MED ORDER — ALTEPLASE 2 MG IJ SOLR: 2.0000 mg | Freq: Once | INTRAMUSCULAR | Status: DC | PRN

## 2017-08-13 MED ORDER — LIDOCAINE HCL (PF) 1 % IJ SOLN
5.0000 mL | INTRAMUSCULAR | Status: DC | PRN
  Filled 2017-08-13: qty 5

## 2017-08-13 MED ORDER — HEPARIN SODIUM (PORCINE) 1000 UNIT/ML DIALYSIS
1000.0000 [IU] | INTRAMUSCULAR | Status: DC | PRN
  Filled 2017-08-13: qty 1

## 2017-08-13 MED ORDER — PENTAFLUOROPROP-TETRAFLUOROETH EX AERO: 1.0000 "application " | INHALATION_SPRAY | CUTANEOUS | Status: DC | PRN

## 2017-08-13 MED ORDER — SODIUM CHLORIDE 0.9 % IV SOLN: 100.0000 mL | INTRAVENOUS | Status: DC | PRN

## 2017-08-13 MED ORDER — OXYCODONE HCL ER 10 MG PO T12A
20.0000 mg | EXTENDED_RELEASE_TABLET | Freq: Two times a day (BID) | ORAL | Status: DC
  Administered 2017-08-13 – 2017-08-16 (×6): 20 mg via ORAL
  Filled 2017-08-13 (×6): qty 2

## 2017-08-13 MED ORDER — OXYCODONE HCL 5 MG PO TABS
ORAL_TABLET | ORAL | Status: AC
  Administered 2017-08-13: 10 mg via ORAL
  Filled 2017-08-13: qty 2

## 2017-08-13 MED ORDER — LIDOCAINE-PRILOCAINE 2.5-2.5 % EX CREA
1.0000 "application " | TOPICAL_CREAM | CUTANEOUS | Status: DC | PRN
  Filled 2017-08-13: qty 5

## 2017-08-13 NOTE — Consult Note (Signed)
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 Wound continues to need Santyl enzymatic debridment ointment. Will continue with those orders. Specified to fold foam dressing in a taco fashion and insert into cleft.  Make sure dressing goes into gluteal cleft and not essentially taping buttocks together. We will follow this patient weekly and remain available to this patient, nursing, and the medical and surgical teams.  Please re-consult if we need to assist further prior to our next visit.   Fara Olden, RN-C, WTA-C, [facility] Wound Treatment Associate Ostomy Care Associate

## 2017-08-13 NOTE — Progress Notes (Signed)
Physical Therapy Session Note  Patient Details  Name: Hayden [name] MRN: [id number] Date of Birth: Oct 02, 1970  Today's Date: 08/13/2017 PT Individual Time: 0803-0900 PT Individual Time Calculation (min): 57 min   Short Term Goals: Week 1:  PT Short Term Goal 1 (Week 1): pt will perform lateral scoot transfer with supervision after set-up with slide board PT Short Term Goal 2 (Week 1): pt will propel w/c x 150' with supervision PT Short Term Goal 3 (Week 1): pt will tolerate wt bearing through L foot during assisted sit> stand, using Stedy PRN  Skilled Therapeutic Interventions/Progress Updates: Pt presented in bed asleep slow to arouse, but alert once awoken. Pt agreeable to therapy, with MIL present for family ed. C/o pain in LLE 6/10, nsg arrived to provide meds including pain med. Per pt unable to lower leg due to pain MIL present. Pt able to performed supine to sit with HOB elevated and min guard to maintain upright unsupported x 5 min. Pt performed AP transfer due to pain management with explanation to MIL regarding safety of transfer and technique. Pt able to perform with min guard and increased time. Pt propelled 191f supervision with increased time and PTA transported remaining distance to ortho gym. Provided pt edu on car transfer with awareness to pain management. Performed car transfer with SB by placing LLE in car (on floorboard) prior to transfer. Pt able to perform transfer with min guard and verbal cues for safe hand placement. Pt also performed with MIL with PTA providing cues for safety and sequencing. After transfers pt propelled additional 1553fwith x 1 rest break and transported remaining distance to room. Pt remained in w/c with needs met.      Therapy Documentation Precautions:  Precautions Precautions: Fall Precaution Comments: pain in left LE Restrictions Weight Bearing Restrictions: No RLE Weight Bearing: Weight bearing as tolerated General:   Vital  Signs: Therapy Vitals Temp: 98.2 F (36.8 C) Temp Source: Oral Pulse Rate: 78 Resp: 18 BP: (!) 171/90 Patient Position (if appropriate): Lying Oxygen Therapy SpO2: 95 % O2 Device: Not Delivered Pain: Pain Assessment Pain Assessment: No/denies pain Pain Score: 0-No pain   See Function Navigator for Current Functional Status.   Therapy/Group: Individual Therapy  Farrah Skoda  Raeann Offner, PTA  08/13/2017, 4:48 PM

## 2017-08-13 NOTE — Progress Notes (Signed)
Social Work Patient ID: [name], male   DOB: 10/09/1970, 47 y.o.   MRN: [id number]   Met with pt, wife and mother-in-law yesterday afternoon following conference.  All aware of targeted d/c date 2/16, however, wife asking "do you really think he'll be ready?"  Attempted to reassure family that team feels he is on track for supervision/ min assist wheelchair goals.   We discussed [facility] follow up and wife asking about referral for Palliative Care at home as she discussed with palliative care RN on acute.  I contacted Mariana Kaufman with [facility] and confirmed that family had requested this as OP and she provided me with contact information on [facility] and [facility] covering [place], [place] area.  I have contacted this agency and left message about need to make referral.  Once I have confirmed that they will be able to follow pt, I will also make referral for further therapy in the home via [facility] (they have followed pt in the past.)   Wife plans to complete family education on Friday.  Tx team aware.  Continue to follow.  Aribella Vavra, LCSW

## 2017-08-13 NOTE — Progress Notes (Signed)
Physical Therapy Note  Patient Details  Name: Hayden [name] MRN: [id number] Date of Birth: April 10, 1971 Today's Date: 08/13/2017  0915-1000, 45 min individual tx Pain: 8/10 LLL; premedicated  tx focused on w/c propulsion using bil UEs for activity tolerance,   A-P transfer w/c<> level mat, pt and family ed regarding bed and w/c positioning, and therapeutic exercises performed with LE to increase strength for functional mobility.  Pt is unable to tolerate full supine due to wound on sacrum.  Partial R side lying/Supine: 10 x 1 short arc quad L knee ext with 3 second hold at end range, L quad sets, L ankle pumps, L hip internal rotation with 3 second hold at end range, L unilateral bridging (wt bearing through L foot). L partial sidelying: L long arc quad knee ext and L resisted PF.  PT educated pt on importance of neutral L hip rotation, maximizing L knee ext and neutral ankle position in bed and in w/c to ensure stable position for wt bearing when pt is able to do so through LLE in standing.  Pt left resting in w/c with all needs within reach. Pt declined quick release belt.  [name] stated she would stay in the room with him until his next therapy in 15 minutes.   See function navigator for current status.  Sylar Voong 08/13/2017, 7:57 AM

## 2017-08-13 NOTE — Progress Notes (Signed)
[place] PHYSICAL MEDICINE & REHABILITATION     PROGRESS NOTE    Subjective/Complaints: Patient with difficulty sleeping last night due to pain.  Still sleeping this morning as mother wanted to let him have another 15 minutes of rest for the had breakfast and got up with therapy.  ROS: pt denies nausea, vomiting, diarrhea, cough, shortness of breath or chest pain   Objective: Vital Signs: Blood pressure (!) 144/78, pulse 74, temperature 97.9 F (36.6 C), temperature source Oral, resp. rate 18, weight 103.3 kg (227 lb 10.1 oz), SpO2 92 %. No results found. Recent Labs    08/11/17 1650  WBC 12.5*  HGB 11.4*  HCT 36.0*  PLT 388   Recent Labs    08/11/17 1620  NA 135  K 4.7  CL 92*  GLUCOSE 80  BUN 53*  CREATININE 8.18*  CALCIUM 9.7   CBG (last 3)  Recent Labs    08/12/17 0704 08/13/17 0612 08/13/17 0632  GLUCAP 114* 69 89    Wt Readings from Last 3 Encounters:  08/13/17 103.3 kg (227 lb 10.1 oz)  08/04/17 96 kg (211 lb 10.3 oz)  06/03/17 96.6 kg (213 lb)    Physical Exam:  Constitutional: He appears well-developed and well-nourished.  HENT:  Head: Normocephalic.  Eyes: Pupils are equal, round, and reactive to light.  Neck: Normal range of motion.  Cardiovascular: RRR without murmur. No JVD     Chest: CTA Bilaterally without wheezes or rales. Normal effort   GI: Soft. He exhibits no distension. There is no tenderness. Abdomen a little more protuberant Musculoskeletal: He exhibits tr+ edema.  Neurological: No cranial nerve deficit.  Bilateral UE's 4/5. RLE 4-/5 HF. LLE 3 to 3+/5 with limitations due to pain. Limited insight and awareness.  Skin:  Tattoos, LLE erythema/eschar unchanged Psychiatric:  Cooperative but just waking up   Assessment/Plan: 1. 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 Physiatrist is providing close team  supervision and 24 hour management of active medical problems listed below. Physiatrist and rehab team continue to assess barriers to discharge/monitor patient progress toward functional and medical goals.  Function:  Bathing Bathing position   Position: Bed  Bathing parts Body parts bathed by patient: Right arm, Left arm, Chest, Abdomen, Front perineal area, Right upper leg, Left upper leg, Back Body parts bathed by helper: Back  Bathing assist Assist Level: Supervision or verbal cues, Set up   Set up : To obtain items  Upper Body Dressing/Undressing Upper body dressing   What is the patient wearing?: Pull over shirt/dress     Pull over shirt/dress - Perfomed by patient: Thread/unthread right sleeve, Thread/unthread left sleeve, Put head through opening, Pull shirt over trunk          Upper body assist Assist Level: Supervision or verbal cues      Lower Body Dressing/Undressing Lower body dressing   What is the patient wearing?: Non-skid slipper socks     Pants- Performed by patient: Thread/unthread right pants leg, Thread/unthread left pants leg Pants- Performed by helper: Pull pants up/down Non-skid slipper socks- Performed by patient: Don/doff left sock                    Lower body assist Assist for lower body dressing: Touching or steadying assistance (Pt > 75%)      Toileting Toileting Toileting activity did not occur: No continent bowel/bladder event  Toileting steps completed by helper: Adjust clothing prior to toileting, Performs perineal hygiene, Adjust clothing after toileting    Toileting assist Assist level: Touching or steadying assistance (Pt.75%)   Transfers Chair/bed transfer   Chair/bed transfer method: Anterior/posterior Chair/bed transfer assist level: Touching or steadying assistance (Pt > 75%) Chair/bed transfer assistive device: Armrests, Prosthesis     Locomotion Ambulation Ambulation activity did not occur: Safety/medical  concerns         Wheelchair   Type: Manual Max wheelchair distance: >500' multiple rest breaks  Assist Level: Supervision or verbal cues  Cognition Comprehension Comprehension assist level: Understands basic 90% of the time/cues < 10% of the time  Expression Expression assist level: Expresses basic needs/ideas: With extra time/assistive device, Expresses basic needs/ideas: With no assist  Social Interaction Social Interaction assist level: Interacts appropriately 90% of the time - Needs monitoring or encouragement for participation or interaction.  Problem Solving Problem solving assist level: Solves basic 90% of the time/requires cueing < 10% of the time  Memory Memory assist level: Recognizes or recalls 90% of the time/requires cueing < 10% of the time   Medical Problem List and Plan:  1. Functional and mobility deficits secondary to debility and cardio-embolic CVA/encephalopathy  -continue therapies -Team conference today 2. Anticoagulation: Pharmaceutical: Coumadin therapeutic. Should stay on medication for stroke prophylaxis  3. Chronic back pain/Pain Management:   Oxycodone  prn during the day with gabapentin bid.      -Increase Oxy CR to 20 mg every 12 hours-observe closely for tolerance--   -Continue breakthrough oxycodone    -continue with other techniques for pain relief including relaxation, tactile feedback   -physical therapy working on strategies with him as well. 4. Bipolar disorder/Mood: continue Lexapro and depakote. LCSW to follow for evaluation and support.  5. Neuropsych: This patient is not capable of making decisions on his own behalf.  6. Skin/Wound Care: recent bx with vasculitis c/w calciphylaxis  -renvela/thiosulfate with HDper renal recs  -pain mgt  -cover left leg daily with non-adherent dressing. Can remove at night 7. Fluids/Electrolytes/Nutrition: strict I/O. Renal diet with 1200 cc FR/day.   8. CAD/Systolic CHF with severe AS, MS/a-flutter: deemed  non-operative candidate  -Treated medically with ASA, Lipitor, Coreg and Isordil  -daily weights, volume mgt ongoing -  9. ESRD: Continue HD MWF. Scheduled at end of the day to help with activity tolerance.     -volume mg per renal Filed Weights   08/11/17 2119 08/12/17 0654 08/13/17 0637  Weight: 103 kg (227 lb 1.2 oz) 103.1 kg (227 lb 4.7 oz) 103.3 kg (227 lb 10.1 oz)    10. HTN: Monitor BID. Continue Apresoline, Avapro, Norvasc, Coreg, Catapres and Isordil.  11. Tobacco abuse: Continue nicotine patch.  12 . H/o Seizure disorder: On Depakote.  13. Neuropathy: continue gabapentin bid.  14. Anemia of chronic disease: Stable.  15. T2DM: Diet controlled at present    -Continue to monitor BS ac/hs. --- remains controlled at present   -Use SSI  PRN   LOS (Days) 8 A FACE TO FACE EVALUATION WAS PERFORMED  Meredith Staggers, MD 08/13/2017 8:36 AM

## 2017-08-13 NOTE — Progress Notes (Deleted)
addendum

## 2017-08-13 NOTE — Progress Notes (Signed)
[facility] ROUNDING NOTE   Subjective:   End stage renal disease seen with no complaints  MWF dialysis. 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 He has severe Aortic stenosis and not a surgical candidate  He has history of multiple infarcts and also coronary artery disease He also has atrial flutter and is currently on eliquis    Appears somewhat drowsy and quite difficult to rouse this am   Objective:  Vital signs in last 24 hours:  Temp:  [97.7 F (36.5 C)-97.9 F (36.6 C)] 97.9 F (36.6 C) (02/13 0637) Pulse Rate:  [70-74] 74 (02/13 0637) Resp:  [18] 18 (02/13 0637) BP: (141-149)/(65-79) 144/78 (02/13 0637) SpO2:  [90 %-92 %] 92 % (02/13 0637) Weight:  [227 lb 10.1 oz (103.3 kg)] 227 lb 10.1 oz (103.3 kg) (02/13 1610)  Weight change: -7.9 oz (-2.946 kg) Filed Weights   08/11/17 2119 08/12/17 0654 08/13/17 0637  Weight: 227 lb 1.2 oz (103 kg) 227 lb 4.7 oz (103.1 kg) 227 lb 10.1 oz (103.3 kg)    Intake/Output: I/O last 3 completed shifts: In: 720 [P.O.:720] Out: 300 [Urine:300]   Intake/Output this shift:  No intake/output data recorded.  Alert, pleasant, nad No jvd Chest cta bilat RRR 3/6 holosyst M Abd soft ntnd obese Ext serpentine skin necrosis R shin/ calf LUA AVF NF, ox3     Basic Metabolic Panel: Recent Labs  Lab 08/06/17 1630 08/08/17 1330 08/11/17 1620  NA 133* 134* 135  K 4.8 4.6 4.7  CL 91* 94* 92*  CO2 24 26 24   GLUCOSE 87 87 80  BUN 38* 34* 53*  CREATININE 7.51* 6.96* 8.18*  CALCIUM 9.7 9.8 9.7  PHOS 6.2* 4.8* 3.6    Liver Function Tests: Recent Labs  Lab 08/06/17 1630 08/08/17 1330 08/11/17 1620  ALBUMIN 2.6* 2.5* 2.5*   No results for input(s): LIPASE, AMYLASE in the last 168 hours. No results for input(s): AMMONIA in the last 168 hours.  CBC: Recent Labs  Lab 08/06/17 1630 08/08/17 1159 08/11/17 1650  WBC 10.9* 10.2 12.5*  HGB 11.1* 11.4* 11.4*  HCT 35.7* 37.2* 36.0*  MCV 89.0 90.5 88.7  PLT  367 364 388    Cardiac Enzymes: No results for input(s): CKTOTAL, CKMB, CKMBINDEX, TROPONINI in the last 168 hours.  BNP: Invalid input(s): POCBNP  CBG: Recent Labs  Lab 08/11/17 0647 08/11/17 2117 08/12/17 0704 08/13/17 0612 08/13/17 0632  GLUCAP 81 112* 114* 69 1    Microbiology: Results for orders placed or performed during the hospital encounter of 07/19/17  Blood culture (routine x 2)     Status: None   Collection Time: 07/19/17 11:25 PM  Result Value Ref Range Status   Specimen Description BLOOD RIGHT ARM  Final   Special Requests   Final    BOTTLES DRAWN AEROBIC AND ANAEROBIC Blood Culture adequate volume   Culture NO GROWTH 5 DAYS  Final   Report Status 07/25/2017 FINAL  Final  Blood culture (routine x 2)     Status: None   Collection Time: 07/19/17 11:30 PM  Result Value Ref Range Status   Specimen Description BLOOD RIGHT HAND  Final   Special Requests   Final    BOTTLES DRAWN AEROBIC ONLY Blood Culture adequate volume   Culture NO GROWTH 5 DAYS  Final   Report Status 07/25/2017 FINAL  Final  MRSA PCR Screening     Status: None   Collection Time: 08/01/17 11:53 PM  Result Value Ref Range Status  MRSA by PCR NEGATIVE NEGATIVE Final    Comment:        The GeneXpert MRSA Assay (FDA approved for NASAL specimens only), is one component of a comprehensive MRSA colonization surveillance program. It is not intended to diagnose MRSA infection nor to guide or monitor treatment for MRSA infections. Performed at [facility], [place] [address] [place], [place] [address]     Coagulation Studies: No results for input(s): LABPROT, INR in the last 72 hours.  Urinalysis: No results for input(s): COLORURINE, LABSPEC, PHURINE, GLUCOSEU, HGBUR, BILIRUBINUR, KETONESUR, PROTEINUR, UROBILINOGEN, NITRITE, LEUKOCYTESUR in the last 72 hours.  Invalid input(s): APPERANCEUR    Imaging: No results found.   Medications:   . sodium thiosulfate infusion for  calciphylaxis     . acetaminophen  650 mg Oral TID WC & HS  . amLODipine  10 mg Oral Daily  . apixaban  5 mg Oral BID  . aspirin  81 mg Oral Daily  . atorvastatin  80 mg Oral q1800  . carvedilol  25 mg Oral BID WC  . cinacalcet  90 mg Oral Q breakfast  . cloNIDine  0.2 mg Transdermal Q Tue  . collagenase   Topical Daily  . divalproex  500 mg Oral Q12H  . escitalopram  10 mg Oral Daily  . ferric citrate  630 mg Oral TID WC  . fluticasone  1 spray Each Nare BID  . gabapentin  100 mg Oral BID  . heparin  4,200 Units Dialysis Once in dialysis  . hydrALAZINE  100 mg Oral Q8H  . hydrocerin   Topical TID  . irbesartan  300 mg Oral Daily  . isosorbide dinitrate  10 mg Oral BID  . lidocaine   Topical QID  . multivitamin  1 tablet Oral QHS  . nicotine  21 mg Transdermal Daily  . oxyCODONE  20 mg Oral Q12H  . sevelamer carbonate  2,400 mg Oral TID WC  . sodium chloride flush  3 mL Intravenous Q12H   acetaminophen, aluminum hydroxide, bisacodyl, camphor-menthol, diphenhydrAMINE, guaiFENesin-dextromethorphan, nitroGLYCERIN, oxyCODONE, polyethylene glycol, prochlorperazine **OR** prochlorperazine **OR** prochlorperazine, senna-docusate, sevelamer carbonate, sodium chloride flush, sodium phosphate  Assessment/ Plan:  Dialysis: MWF NW 4h  98kg (lower at dc) 2/2.25 bath  LUA AVF   Hep 2400 - Hectoral 51mcg IV q HD(D/c'd) - Venofer 100mg  x 10 ordered (4 given) - Mircera 266mcg IV q 2 weeks (last 1/18) - Na thiosulfate 25 gm QHD started this hospitalization -home BP-norvasc 10/ coreg 25 bid/ clon patch 0.2 / hydral 100 tid/ avapro 300      Impression: 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 3  CM/ severe AS, not surg candidate 4  CVA mult, small 5  Aflutter, changing to eliquis ,renal dose per pharm (for #1) 6  HTN mult meds, bp ok 7  Vol under dry, lower edw at dc 8  AMS resolved 9  PVD 10  DM2 11  Bipolar 12  Anemia ckd, Hb good, dc'd Fe load d/t #1 13  CAD sp cath 2V CAD, med  Rx 14  EOL - DNR now    LOS: 8 Kais Monje W @TODAY @10 :42 AM

## 2017-08-13 NOTE — Patient Care Conference (Signed)
Inpatient RehabilitationTeam Conference and Plan of Care Update Date: 08/12/2017   Time: 2:15 PM    Patient Name: Hayden [name]      Medical Record Number: [id number]  Date of Birth: [birth year] Sex: Male         Room/Bed: 4W03C/4W03C-01 Payor Info: Payor: MEDICARE / Plan: MEDICARE PART A AND B / Product Type: *No Product type* /    Admitting Diagnosis: CVA  Admit Date/Time:  08/05/2017  6:11 PM Admission Comments: No comment available   Primary Diagnosis:  Stroke due to embolism ([place]) Principal Problem: Stroke due to embolism [facility])  Patient Active Problem List   Diagnosis Date Noted  . Postoperative pain   . Diabetes mellitus type 2 in obese ([place])   . Anemia of chronic disease   . Essential hypertension   . Chronic combined systolic and diastolic CHF (congestive heart failure) ([place])   . Chronic pain syndrome   . Stroke due to embolism ([place]) 08/05/2017  . Palliative care by specialist   . Advance care planning   . Palliative care encounter   . Goals of care, counseling/discussion   . 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 Severe aortic stenosis   . Cellulitis and abscess of left lower extremity 07/20/2017  . Hyperlipidemia with target low density lipoprotein (LDL) cholesterol less than 70 mg/dL 06/03/2017  . Atrial flutter ([place]); CHA2DS2Vasc -5, on warfarin 05/09/2017  . Complete heart block ([place]) - s/p PPM 05/02/2017  . ESRD on hemodialysis ([place])   . Depression   . Phantom limb pain ([place])   . Hypertensive heart disease with chronic diastolic congestive heart failure ([place])   . 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 Atherosclerosis of native arteries of the extremities with ulceration ([place]) 12/02/2016  . Neuropathic pain   . Type 2 diabetes mellitus with diabetic peripheral angiopathy and gangrene, without long-term current use of insulin ([place])   . Debility 11/22/2016  . S/P femoral-popliteal bypass surgery   . Tobacco abuse   . History of CVA  (cerebrovascular accident)   . QT prolongation   . Gangrene of right foot ([place])   . End stage renal disease on dialysis ([place]) 06/19/2015  . GERD (gastroesophageal reflux disease) 06/19/2015  . Acute coronary syndrome ([place]) 09/22/2012  . History of partial Right nephrectomy for renal mass (2008) 09/22/2012  . Hypertensive urgency, malignant 09/21/2012    Expected Discharge Date: Expected Discharge Date: 08/16/17  Team Members Present: Physician leading conference: Dr. Alger Simons Social Worker Present: Lennart Pall, LCSW Nurse Present: Benjie Karvonen, RN PT Present: Kem Parkinson, PT OT Present: Roanna Epley, COTA;Jennifer Tamala Julian, OT PPS Coordinator present : Ileana Ladd, PT     Current Status/Progress Goal Weekly Team Focus  Medical   Pain control seems better.  Wounds improving to stable.  Protective dressing for therapy.  Volume management per nephrology  Maximize pain control  Ongoing wound care, volume balancing with dialysis, pain control   Bowel/Bladder   cont bowel; oliguric; lbm 2/10  maintain bowel continence; hemodialysis therapy  admin dulcolax/miralax/senokot prn; MWF HD   Swallow/Nutrition/ Hydration             ADL's   bathing/dressing-supervision bed level; min a w/c level; toilet transfers/tub transfers-supervision with slide boartd  supervision overall  activity tolerance; functional tranfsers, family education   Mobility   min guard transfers using slideboard, WC mobility, tolerates <5 sec WBing LLE  modI transfers, Wc mobility, gait TBD d/t pt tolerance in WBing LLE  activity tolerance, prosthetic training per pt tolerance, WC mobility, pt/family education   Communication             Safety/Cognition/ Behavioral Observations  confused at times  reorient  assess orientation q shift and prn   Pain   oxycontin; oxycodone; tylenol  <4  assess for pain q shift and prn   Skin   scattered excoriation; R AKA; scattered abrasions; calcyphylaxis wounds to  LLE; sacral wound  free from further breakdown and/or  injury  apply lidocaine; dressing changes as ordered for LLE and sacrum; assess for changes q shift and prn    Rehab Goals Patient on target to meet rehab goals: Yes *See Care Plan and progress notes for long and short-term goals.     Barriers to Discharge  Current Status/Progress Possible Resolutions Date Resolved   Physician    Wound Care;Medical stability        Ongoing  medical management of the above      Nursing                  PT                    OT                  SLP                SW                Discharge Planning/Teaching Needs:  Plan to d/c home with spouse and son who can provide 24/7 assistance (providing PTA)  Will plan teaching family prior to d/c.   Team Discussion:  Wound care and pain continue to be issues.  Left leg pain is limiting overall function and PT is not planning to set any gait goals.  PT performing AP transfers (was doing this PTA). Making progress and should meet supervision to min assist w/c level.  Need to plan ed with son and wife.  Review of DME and follow up needs.  Revisions to Treatment Plan:  None    Continued Need for Acute Rehabilitation Level of Care: The patient requires daily medical management by a physician with specialized training in physical medicine and rehabilitation for the following conditions: Daily direction of a multidisciplinary physical rehabilitation program to ensure safe treatment while eliciting the highest outcome that is of practical value to the patient.: Yes Daily medical management of patient stability for increased activity during participation in an intensive rehabilitation regime.: Yes Daily analysis of laboratory values and/or radiology reports with any subsequent need for medication adjustment of medical intervention for : Wound care problems;Renal problems  Chlora Mcbain 08/13/2017, 12:04 PM

## 2017-08-13 NOTE — Progress Notes (Signed)
Social Work Patient ID: Hayden [name], male   DOB: Apr 20, 1971, 47 y.o.   MRN: [id number]    Hayden [name]  Social Worker  General Practice  Patient Care Conference  Signed  Date of Service:  08/13/2017 12:02 PM          Signed          [] Hide copied text  [] Hover for details   Inpatient RehabilitationTeam Conference and Plan of Care Update Date: 08/12/2017   Time: 2:15 PM      Patient Name: Hayden [name]      Medical Record Number: [id number]  Date of Birth: 09-22-1970 Sex: Male         Room/Bed: 4W03C/4W03C-01 Payor Info: Payor: MEDICARE / Plan: MEDICARE PART A AND B / Product Type: *No Product type* /     Admitting Diagnosis: CVA  Admit Date/Time:  08/05/2017  6:11 PM Admission Comments: No comment available    Primary Diagnosis:  Stroke due to embolism ([place]) Principal Problem: Stroke due to embolism [facility])       Patient Active Problem List    Diagnosis Date Noted  . Postoperative pain    . Diabetes mellitus type 2 in obese ([place])    . Anemia of chronic disease    . Essential hypertension    . Chronic combined systolic and diastolic CHF (congestive heart failure) ([place])    . Chronic pain syndrome    . Stroke due to embolism ([place]) 08/05/2017  . Palliative care by specialist    . Advance care planning    . Palliative care encounter    . Goals of care, counseling/discussion    . 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 Severe aortic stenosis    . Cellulitis and abscess of left lower extremity 07/20/2017  . Hyperlipidemia with target low density lipoprotein (LDL) cholesterol less than 70 mg/dL 06/03/2017  . Atrial flutter ([place]); CHA2DS2Vasc -5, on warfarin 05/09/2017  . Complete heart block ([place]) - s/p PPM 05/02/2017  . ESRD on hemodialysis ([place])    . Depression    . Phantom limb pain ([place])    . Hypertensive heart disease with chronic diastolic congestive heart failure ([place])    . 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 Atherosclerosis of  native arteries of the extremities with ulceration ([place]) 12/02/2016  . Neuropathic pain    . Type 2 diabetes mellitus with diabetic peripheral angiopathy and gangrene, without long-term current use of insulin ([place])    . Debility 11/22/2016  . S/P femoral-popliteal bypass surgery    . Tobacco abuse    . History of CVA (cerebrovascular accident)    . QT prolongation    . Gangrene of right foot ([place])    . End stage renal disease on dialysis ([place]) 06/19/2015  . GERD (gastroesophageal reflux disease) 06/19/2015  . Acute coronary syndrome ([place]) 09/22/2012  . History of partial Right nephrectomy for renal mass (2008) 09/22/2012  . Hypertensive urgency, malignant 09/21/2012      Expected Discharge Date: Expected Discharge Date: 08/16/17   Team Members Present: Physician leading conference: Dr. Alger [name] Social Worker Present: Hayden Pall, LCSW Nurse Present: Hayden Karvonen, RN PT Present: Hayden [name], PT OT Present: Hayden [name], COTA;Hayden [name], OT PPS Coordinator present : Hayden [name], PT       Current Status/Progress Goal Weekly Team Focus  Medical     Pain control seems better.  Wounds improving to stable.  Protective dressing for therapy.  Volume  management per nephrology  Maximize pain control  Ongoing wound care, volume balancing with dialysis, pain control   Bowel/Bladder     cont bowel; oliguric; lbm 2/10  maintain bowel continence; hemodialysis therapy  admin dulcolax/miralax/senokot prn; MWF HD   Swallow/Nutrition/ Hydration               ADL's     bathing/dressing-supervision bed level; min a w/c level; toilet transfers/tub transfers-supervision with slide boartd  supervision overall  activity tolerance; functional tranfsers, family education   Mobility     min guard transfers using slideboard, WC mobility, tolerates <5 sec WBing LLE  modI transfers, Wc mobility, gait TBD d/t pt tolerance in WBing LLE  activity tolerance, prosthetic training per pt  tolerance, WC mobility, pt/family education   Communication               Safety/Cognition/ Behavioral Observations   confused at times  reorient  assess orientation q shift and prn   Pain     oxycontin; oxycodone; tylenol  <4  assess for pain q shift and prn   Skin     scattered excoriation; R AKA; scattered abrasions; calcyphylaxis wounds to LLE; sacral wound  free from further breakdown and/or  injury  apply lidocaine; dressing changes as ordered for LLE and sacrum; assess for changes q shift and prn     Rehab Goals Patient on target to meet rehab goals: Yes *See Care Plan and progress notes for long and short-term goals.      Barriers to Discharge   Current Status/Progress Possible Resolutions Date Resolved   Physician     Wound Care;Medical stability        Ongoing  medical management of the above      Nursing                 PT                    OT                 SLP            SW              Discharge Planning/Teaching Needs:  Plan to d/c home with spouse and son who can provide 24/7 assistance (providing PTA)  Will plan teaching family prior to d/c.   Team Discussion:  Wound care and pain continue to be issues.  Left leg pain is limiting overall function and PT is not planning to set any gait goals.  PT performing AP transfers (was doing this PTA). Making progress and should meet supervision to min assist w/c level.  Need to plan ed with son and wife.  Review of DME and follow up needs.  Revisions to Treatment Plan:  None    Continued Need for Acute Rehabilitation Level of Care: The patient requires daily medical management by a physician with specialized training in physical medicine and rehabilitation for the following conditions: Daily direction of a multidisciplinary physical rehabilitation program to ensure safe treatment while eliciting the highest outcome that is of practical value to the patient.: Yes Daily medical management of patient stability for  increased activity during participation in an intensive rehabilitation regime.: Yes Daily analysis of laboratory values and/or radiology reports with any subsequent need for medication adjustment of medical intervention for : Wound care problems;Renal problems   Hayden [name] 08/13/2017, 12:04 PM  Hayden [name], [place] Social Worker Signed   Patient Care Conference Date of Service: 12/12/2016 11:11 AM      Hide copied text Hover for attribution information Inpatient RehabilitationTeam Conference and Plan of Care Update Date: 12/11/2016   Time: 11:45 AM      Patient Name: ELLISON RIETH      Medical Record Number: [id number]  Date of Birth: 05-Dec-1970 Sex: Male         Room/Bed: 4W02C/4W02C-01 Payor Info: Payor: MEDICARE / Plan: MEDICARE PART A AND B / Product Type: *No Product type* /     Admitting Diagnosis: R AKA  esrd  Admit Date/Time:  12/09/2016  2:14 PM Admission Comments: No comment available    Primary Diagnosis:  <principal problem not specified> Principal Problem: <principal problem not specified>       Patient Active Problem List    Diagnosis Date Noted  . Prediabetes    . Phantom limb pain ([place])    . Hypertensive urgency    . Type 2 diabetes mellitus with peripheral neuropathy (HCC)    . Amputee, above knee, right ([place]) 12/09/2016  . Chronic diastolic heart failure ([place])    . Type 2 diabetes mellitus with diabetic peripheral angiopathy without gangrene, without long-term current use of insulin ([place])    . Unilateral AKA, right ([place])    . ESRD on dialysis ([place])    . 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 Atherosclerosis of native arteries of extremities with gangrene, right leg ([place]) 12/02/2016  . Neuropathic pain    . Hypertensive crisis    . Labile [name] pressure    . Type 2 diabetes mellitus with diabetic peripheral angiopathy and gangrene, without long-term current use of insulin ([place])    . Debility 11/22/2016  .  Anemia of chronic disease    . Diabetes mellitus type 2 in nonobese (HCC)    . Confusion    . S/P femoral-popliteal bypass surgery    . Tobacco abuse    . Benign essential HTN    . History of CVA (cerebrovascular accident)    . Post-operative pain    . Acute respiratory failure with hypoxia ([place])    . Agitation 11/04/2016  . Acute encephalopathy 11/04/2016  . Cellulitis of right lower extremity    . Hypokalemia 11/02/2016  . QT prolongation    . Gangrene of right foot ([place])    . Pressure injury of skin 10/24/2016  . Cellulitis of great toe of right foot 10/23/2016  . Leukocytosis 10/23/2016  . Hyperkalemia 10/23/2016  . Abdominal hematoma 11/15/2015  . HCAP (healthcare-associated pneumonia) 11/15/2015  . Acute [name] loss anemia 11/15/2015  . Absolute anemia    . Right lower quadrant pain    . Pneumonia involving right lung 11/04/2015  . HTN (hypertension) 11/04/2015  . Noninfectious gastroenteritis and colitis 11/04/2015  . Gastroenteritis 11/04/2015  . CVA (cerebral infarction) 06/19/2015  . End stage renal disease on dialysis ([place]) 06/19/2015  . Lacunar infarct, acute ([place]) 06/19/2015  . GERD (gastroesophageal reflux disease) 06/19/2015  . Anxiety and depression 06/19/2015  . Hypertension, well controlled 06/19/2015  . End stage renal disease ([place]) 01/21/2013  . Acute coronary syndrome ([place]) 09/22/2012  . History of partial Right nephrectomy for renal mass (2008) 09/22/2012  . Left ventricular hypertrophy (moderate) per ECHO 2010 09/22/2012  . Chronic systolic congestive heart failure/EF 40% PER echo 2010 09/22/2012  . 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 Acute kidney injury ([place]) 09/21/2012  .  Chest pain 09/21/2012      Expected Discharge Date: Expected Discharge Date: 12/14/16   Team Members Present: Physician leading conference: Dr. Delice Lesch Social Worker Present: Hayden Pall, LCSW Nurse Present: Other (comment) Genene Churn, RN) PT Present: Jorge Mandril, PT OT Present: Simonne Come, OT SLP Present: Windell Moulding, SLP PPS Coordinator present : Daiva Nakayama, RN, CRRN       Current Status/Progress Goal Weekly Team Focus  Medical     Decreased functional mobility secondary to right AKA 12/04/2016 with postop delirium  Improve mobility, safety, pain, HTN  See above   Bowel/Bladder     continent of bladderand bowel ,bm 6/12  stay continent all the time  maintain continence level   Swallow/Nutrition/ Hydration               ADL's     min/steady assist bathing and LB dressing, min-mod assist toilet transfer,   supervision/setup  ADL retraining, transfers, pt/family education   Mobility     min A  mod I transfers, supervision gait short distance  transfers, safety awareness, family ed, d/c plan   Communication               Safety/Cognition/ Behavioral Observations             Pain     pain with activities controlled by Oxy ,Gabapentin  less<2  monitor and adress it as need it   Skin     wound vac removed today,daily dray dressing on the top of stump incision(sutures and staples)  daily incision care,no break down   monitor RBKA incision and  daily wound care     Rehab Goals Patient on target to meet rehab goals: Yes *See Care Plan and progress notes for long and short-term goals.   Barriers to Discharge: Pain, HTN, ABLA, DM, mobility, safety     Possible Resolutions to Barriers:  Therapies, optimize BP meds, adjust HTN meds     Discharge Planning/Teaching Needs:  Home with wife who does work swing shift and two adult children. Wife stays here with pt to assist with PD.      Team Discussion:  Wound VAC to be removed tomorrow.  Pain under good control.  Min assist of eval with superivision to mod inde goals.  Anxiety present and will have TR follow up.    Revisions to Treatment Plan:  None    Continued Need for Acute Rehabilitation Level of Care: The patient requires daily medical management by a physician with  specialized training in physical medicine and rehabilitation for the following conditions: Daily direction of a multidisciplinary physical rehabilitation program to ensure safe treatment while eliciting the highest outcome that is of practical value to the patient.: Yes Daily medical management of patient stability for increased activity during participation in an intensive rehabilitation regime.: Yes Daily analysis of laboratory values and/or radiology reports with any subsequent need for medication adjustment of medical intervention for : Post surgical problems;Wound care problems;[name] pressure problems   Hayden [name] 12/12/2016, 1:33 PM

## 2017-08-13 NOTE — Progress Notes (Signed)
Occupational Therapy Session Note  Patient Details  Name: Hayden [name] MRN: [id number] Date of Birth: 1971-02-27  Today's Date: 08/13/2017 OT Individual Time: 1030-1140 OT Individual Time Calculation (min): 70 min  and Today's Date: 08/13/2017 OT Missed Time: 20 Minutes Missed Time Reason: Patient fatigue   Short Term Goals: Week 1:  OT Short Term Goal 1 (Week 1): Pt will transfer to BSC/ toilet with min A  OT Short Term Goal 2 (Week 1): Pt will thread LB clothing with setup  OT Short Term Goal 3 (Week 1): Pt will tolerate left LE being dependent in prep for transfer/ ADL task OT Short Term Goal 4 (Week 1): Pt will perform bed mobility with mod I in prep for ADL tasks   Skilled Therapeutic Interventions/Progress Updates:    Pt sleeping in w/c upon arrival with mother-in-law present.  Initial focus on washing hair at sink with emphasis on pt using BUE to assist with washing while head bent over sink.  Pt transitioned to ADL tub room and practiced tub bench transfers X 2 at supervision level.  Pt transitioned to Day Room and engaged in BUE therex-10 mins on SciFit, PNF with 2kg ball, and therex with 5# weight bar.  Pt with difficutly keeping eyes open and returned to room.  Pt transferred to bed (slide board) at supervision level.  Pt remained in bed with mother-in-law present.   Therapy Documentation Precautions:  Precautions Precautions: Fall Precaution Comments: pain in left LE Restrictions Weight Bearing Restrictions: No RLE Weight Bearing: Weight bearing as tolerated General: General OT Amount of Missed Time: 20 Minutes  Pain:  Pt c/o increased discomfort when LLE placed in dependent position; repositioned  See Function Navigator for Current Functional Status.   Therapy/Group: Individual Therapy  Leroy Libman 08/13/2017, 12:15 PM
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
## 2017-08-14 ENCOUNTER — Inpatient Hospital Stay (HOSPITAL_COMMUNITY): Payer: Self-pay

## 2017-08-14 LAB — GLUCOSE, CAPILLARY: Glucose-Capillary: 95 mg/dL (ref 65–99)

## 2017-08-14 NOTE — Progress Notes (Signed)
Physical Therapy Session Note  Patient Details  Name: Hayden [name] MRN: [id number] Date of Birth: [birth year]  Today's Date: 08/14/2017 PT Individual Time: 7858-8502 PT Individual Time Calculation (min): 60 min   Short Term Goals: Week 1:  PT Short Term Goal 1 (Week 1): pt will perform lateral scoot transfer with supervision after set-up with slide board PT Short Term Goal 2 (Week 1): pt will propel w/c x 150' with supervision PT Short Term Goal 3 (Week 1): pt will tolerate wt bearing through L foot during assisted sit> stand, using Stedy PRN  Skilled Therapeutic Interventions/Progress Updates:    Patient in supine c/o severe buttock and L LE pain.  RN in to dress wounds.  Pt able to roll mod I in supine with bed rails.  Patient supine to don shorts with son assist for all aspects.  Patient sitting up in bed to don shirt with set up.  Patient transferred ant/post to w/c from bed with son holding chair and close S.  Patient in w/c requested son to push him due to continued L LE pain.  Patient in ortho gym performed car transfer with slide board with son assist.  Mod cues and assist for board repositioning, once with pt using bar overhead to scoot, then to w/c with at least one hand on board after cues.  Practiced second time with at least one hand on board for safety.  Demonstrated SBT to car with both hands on board and weight shifting to unweight side scooting to.  Patient performed 5' on UBE with half time forward and half back.  Education with son and pt about safety with set up for transfers with board, need for cushion for w/c for pressure relief and lying on side in bed, etc.  Discussed donning liner for prosthetic to prep to don later today, but pt declined stating wore it all afternoon yesterday and leg was sore from wearing it.  Declined back to bed to perform LE therex.  Left up in chair with son present missing 15 min due to pain/refusal of above activities.  SW informed need for pressure  relieving cushion for w/c at home.  Therapy Documentation Precautions:  Precautions Precautions: Fall Precaution Comments: pain in left LE Restrictions Weight Bearing Restrictions: Yes RLE Weight Bearing: Weight bearing as tolerated General: PT Amount of Missed Time (min): 15 Minutes PT Missed Treatment Reason: Patient fatigue;Pain Pain: Pain Assessment Pain Assessment: 0-10 Pain Score: 6  Pain Type: Acute pain Pain Location: Leg Pain Orientation: Left Pain Descriptors / Indicators: Aching Pain Frequency: Constant Pain Onset: On-going Patients Stated Pain Goal: 2 Pain Intervention(s): Repositioned;Distraction Multiple Pain Sites: Yes 2nd Pain Site Pain Score: 6 Pain Type: Chronic pain Pain Location: Sacrum Pain Orientation: Mid;Upper Pain Descriptors / Indicators: Aching;Sore Pain Frequency: Intermittent Pain Onset: On-going Patient's Stated Pain Goal: 2 Pain Intervention(s): Repositioned;Distraction   See Function Navigator for Current Functional Status.   Therapy/Group: Individual Therapy  Reginia Naas 08/14/2017, 12:06 PM

## 2017-08-14 NOTE — Progress Notes (Signed)
Occupational Therapy Note  Patient Details  Name: NICOLAUS ANDEL MRN: [id number] Date of Birth: 06-17-1971  Today's Date: 08/14/2017 OT Individual Time: 1345-1415 OT Individual Time Calculation (min): 30 min   Pt c/o increased pain (8/10) in buttocks and LLE; RN aware and repositioned Individual Therapy  Pt resting in w/c upon arrival with son present.  OT intervention with focus on w/c mobility and BUE therex.  Pt c/o increased pain/discomfort but willing to participate in therapy.  Pt propelled w/c to Day Room and engaged in obstacle course tasks before engaging in SciFit exercises (10 min at work load 2).  Pt c/o increased pain in buttocks and returned to room and transferred to bed with slide board.  Pt's son assisted with setup.  Pt missed 15 mins skilled OT services 2/2 pain and fatigue.    Leotis Shames [facility] 08/14/2017, 2:53 PM

## 2017-08-14 NOTE — Progress Notes (Signed)
Physical Therapy Note  Patient Details  Name: Hayden [name] MRN: [id number] Date of Birth: 1971/06/23 Today's Date: 08/14/2017  1300-1330, 30 min individual tx Pain: 5/10 L calf, premeciated  W/c> level mat A-P transfer with supervision.  Therapeutic exercises performed with LEs to increase strength for functional mobility: in supine 5 x 2  L unilateral bridging, bil hip abduction using light green resistance band, R hip ext, modified abdominal crunches.  L side lying: 10 x 1 R hip abduction.    From raised mat, pt able to accept some wt bearing LLE as he transferred wt forward to press up with bil UE and elevate hips slightly, x 5.  A-P transfer to return to room.  Pt left resting in w/c with all needs within reach.  Son in room and understands fall precautions and need to call NSG if he steps out of room.   Adel Neyer 08/14/2017, 12:37 PM

## 2017-08-14 NOTE — Progress Notes (Signed)
Occupational Therapy Session Note  Patient Details  Name: Hayden [name] MRN: [id number] Date of Birth: 06-29-1971  Today's Date: 08/14/2017 OT Individual Time: 1100-1154 OT Individual Time Calculation (min): 54 min    Short Term Goals: Week 1:  OT Short Term Goal 1 (Week 1): Pt will transfer to BSC/ toilet with min A  OT Short Term Goal 2 (Week 1): Pt will thread LB clothing with setup  OT Short Term Goal 3 (Week 1): Pt will tolerate left LE being dependent in prep for transfer/ ADL task OT Short Term Goal 4 (Week 1): Pt will perform bed mobility with mod I in prep for ADL tasks   Skilled Therapeutic Interventions/Progress Updates:    Pt resting in w/c with son present for education.  Pt practiced slide board w/c<>tub bench transfers X 2.  Discussed toilet setup at home.  Pt will not be able to access toilet from w/c with slide board.  Pt owns BSC and recommendation made to use BSC with A/P transfer from/to bed.  Pt and son verbalized understanding and agreement.  Pt also engaged in w/c mobility in home setting.  Pt fatigues quickly and requires multiple rest breaks.  Pt returned to room and remained in w/c with son present.   Therapy Documentation Precautions:  Precautions Precautions: Fall Precaution Comments: pain in left LE Restrictions Weight Bearing Restrictions: Yes RLE Weight Bearing: Weight bearing as tolerated  Pain: Pain Assessment Pain Assessment: 0-10 Pain Score: 6  Pain Type: Acute pain Pain Location: Leg Pain Orientation: Left Pain Descriptors / Indicators: Aching Pain Frequency: Constant Pain Onset: On-going Patients Stated Pain Goal: 2 Pain Intervention(s): Repositioned;Distraction Multiple Pain Sites: Yes 2nd Pain Site Pain Score: 6 Pain Type: Chronic pain Pain Location: Sacrum Pain Orientation: Mid;Upper Pain Descriptors / Indicators: Aching;Sore Pain Frequency: Intermittent Pain Onset: On-going Patient's Stated Pain Goal: 2 Pain Intervention(s):  Repositioned;Distraction  See Function Navigator for Current Functional Status.   Therapy/Group: Individual Therapy  Leroy Libman 08/14/2017, 12:08 PM

## 2017-08-14 NOTE — Progress Notes (Signed)
[place] [facility]ICINE & REHABILITATION     PROGRESS NOTE    Subjective/Complaints: Had a reasonable night.   Pain still an issue at times  ROS: pt denies nausea, vomiting, diarrhea, cough, shortness of breath or chest pain   Objective: Vital Signs: Blood pressure (!) 156/81, pulse 82, temperature 98 F (36.7 C), temperature source Axillary, resp. rate 16, height 5\' 10"  (1.778 m), weight 100.3 kg (221 lb 1.9 oz), SpO2 95 %. No results found. Recent Labs    08/11/17 1650 08/13/17 1315  WBC 12.5* 11.1*  HGB 11.4* 11.2*  HCT 36.0* 36.0*  PLT 388 343   Recent Labs    08/11/17 1620 08/13/17 1315  NA 135 132*  K 4.7 4.5  CL 92* 93*  GLUCOSE 80 99  BUN 53* 55*  CREATININE 8.18* 7.67*  CALCIUM 9.7 9.4   CBG (last 3)  Recent Labs    08/13/17 0612 08/13/17 0632 08/14/17 0650  GLUCAP 69 89 95    Wt Readings from Last 3 Encounters:  08/14/17 100.3 kg (221 lb 1.9 oz)  08/04/17 96 kg (211 lb 10.3 oz)  06/03/17 96.6 kg (213 lb)    Physical Exam:  Constitutional: He appears well-developed and well-nourished.  HENT:  Head: Normocephalic.  Eyes: Pupils are equal, round, and reactive to light.  Neck: Normal range of motion.  Cardiovascular: RRR without murmur. No JVD     Chest: CTA Bilaterally without wheezes or rales. Normal effort   GI: Soft. He exhibits no distension. There is no tenderness. Abdomen a little more protuberant Musculoskeletal: He exhibits tr+ edema.  Neurological: No cranial nerve deficit.  Bilateral UE's 4/5. RLE 4-/5 HF. LLE 3 to 3+/5 with limitations due to pain. Limited insight and awareness.  Skin:  Tattoos, LLE erythema/eschar unchanged. Gluteal wound with dressing Psychiatric:  Cooperative but just waking up   Assessment/Plan: 1. 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 Physiatrist is providing close team supervision  and 24 hour management of active medical problems listed below. Physiatrist and rehab team continue to assess barriers to discharge/monitor patient progress toward functional and medical goals.  Function:  Bathing Bathing position   Position: Bed  Bathing parts Body parts bathed by patient: Right arm, Left arm, Chest, Abdomen, Front perineal area, Right upper leg, Left upper leg, Back Body parts bathed by helper: Back  Bathing assist Assist Level: Supervision or verbal cues, Set up   Set up : To obtain items  Upper Body Dressing/Undressing Upper body dressing   What is the patient wearing?: Pull over shirt/dress     Pull over shirt/dress - Perfomed by patient: Thread/unthread right sleeve, Thread/unthread left sleeve, Put head through opening, Pull shirt over trunk          Upper body assist Assist Level: Supervision or verbal cues      Lower Body Dressing/Undressing Lower body dressing   What is the patient wearing?: Non-skid slipper socks     Pants- Performed by patient: Thread/unthread right pants leg, Thread/unthread left pants leg Pants- Performed by helper: Pull pants up/down Non-skid slipper socks- Performed by patient: Don/doff left sock                    Lower body assist Assist for lower body dressing: Touching or steadying assistance (Pt > 75%)      Toileting Toileting Toileting activity did not occur: No continent bowel/bladder event  Toileting steps completed by helper: Adjust clothing prior to toileting, Performs perineal hygiene, Adjust clothing after toileting    Toileting assist Assist level: Touching or steadying assistance (Pt.75%)   Transfers Chair/bed transfer   Chair/bed transfer method: Anterior/posterior Chair/bed transfer assist level: Touching or steadying assistance (Pt > 75%) Chair/bed transfer assistive device: Armrests     Locomotion Ambulation Ambulation activity did not occur: Safety/medical concerns          Wheelchair   Type: Manual Max wheelchair distance: 100 Assist Level: Supervision or verbal cues  Cognition Comprehension Comprehension assist level: Understands basic 90% of the time/cues < 10% of the time  Expression Expression assist level: Expresses basic needs/ideas: With extra time/assistive device, Expresses basic needs/ideas: With no assist  Social Interaction Social Interaction assist level: Interacts appropriately 90% of the time - Needs monitoring or encouragement for participation or interaction.  Problem Solving Problem solving assist level: Solves basic 90% of the time/requires cueing < 10% of the time  Memory Memory assist level: Recognizes or recalls 90% of the time/requires cueing < 10% of the time   Medical Problem List and Plan:  1. Functional and mobility deficits secondary to debility and cardio-embolic CVA/encephalopathy  -continue therapies -Working towards discharge on Saturday 2. Anticoagulation: Pharmaceutical: Coumadin therapeutic. Should stay on medication for stroke prophylaxis  3. Chronic back pain/Pain Management:   Oxycodone  prn during the day with gabapentin bid.      -Increased Oxy CR to 20 mg every 12 hours-observe closely for tolerance--   -Continue breakthrough oxycodone    -continue with other techniques for pain relief including relaxation, tactile feedback     4. Bipolar disorder/Mood: continue Lexapro and depakote. LCSW to follow for evaluation and support.  5. Neuropsych: This patient is not capable of making decisions on his own behalf.  6. Skin/Wound Care: recent bx with vasculitis c/w calciphylaxis  -renvela/thiosulfate with HDper renal recs  -pain mgt  -cover left leg daily with non-adherent dressing. Can remove at night -local dressings elsewhere, nutrition important 7. Fluids/Electrolytes/Nutrition: strict I/O. Renal diet with 1200 cc FR/day.   8. CAD/Systolic CHF with severe AS, MS/a-flutter: deemed non-operative candidate  -Treated  medically with ASA, Lipitor, Coreg and Isordil  -daily weights, volume mgt ongoing -  9. ESRD: Continue HD MWF. Scheduled at end of the day to help with activity tolerance.     -volume mg per renal Filed Weights   08/13/17 1315 08/13/17 1756 08/14/17 0500  Weight: 103.2 kg (227 lb 8.2 oz) 100.8 kg (222 lb 3.6 oz) 100.3 kg (221 lb 1.9 oz)    10. HTN: Monitor BID. Continue Apresoline, Avapro, Norvasc, Coreg, Catapres and Isordil.  11. Tobacco abuse: Continue nicotine patch.  12 . H/o Seizure disorder: On Depakote.  13. Neuropathy: continue gabapentin bid.  14. Anemia of chronic disease: Stable.  15. T2DM: Diet controlled at present    -Continue to monitor BS ac/hs. --- remains controlled at present   -Use SSI  PRN   LOS (Days) 9 A FACE TO FACE EVALUATION WAS PERFORMED  Meredith Staggers, MD 08/14/2017 7:54 AM

## 2017-08-14 NOTE — Progress Notes (Signed)
[facility] ROUNDING NOTE   Subjective:   End stage renal disease seen with no complaints  MWF dialysis. 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 He has severe Aortic stenosis and not a surgical candidate  He has history of multiple infarcts and also coronary artery disease He also has atrial flutter and is currently on eliquis    More awake and alert  Complains of pain over LLE wound and buttocks  Objective:  Vital signs in last 24 hours:  Temp:  [98 F (36.7 C)-98.2 F (36.8 C)] 98 F (36.7 C) (02/14 0244) Pulse Rate:  [66-87] 82 (02/14 0244) Resp:  [16-18] 16 (02/14 0300) BP: (131-203)/(66-95) 156/81 (02/14 0244) SpO2:  [94 %-95 %] 95 % (02/14 0244) Weight:  [221 lb 1.9 oz (100.3 kg)-227 lb 8.2 oz (103.2 kg)] 221 lb 1.9 oz (100.3 kg) (02/14 0500)  Weight change: -1.9 oz (-0.054 kg) Filed Weights   08/13/17 1315 08/13/17 1756 08/14/17 0500  Weight: 227 lb 8.2 oz (103.2 kg) 222 lb 3.6 oz (100.8 kg) 221 lb 1.9 oz (100.3 kg)    Intake/Output: I/O last 3 completed shifts: In: 480 [P.O.:480] Out: 3000 [Other:3000]   Intake/Output this shift:  No intake/output data recorded.  Alert, pleasant, nad No jvd Chest cta bilat RRR 3/6 holosyst M Abd soft ntnd obese Ext serpentine skin necrosis R shin/ calf LUA AVF NF, ox3     Basic Metabolic Panel: Recent Labs  Lab 08/08/17 1330 08/11/17 1620 08/13/17 1315  NA 134* 135 132*  K 4.6 4.7 4.5  CL 94* 92* 93*  CO2 26 24 22   GLUCOSE 87 80 99  BUN 34* 53* 55*  CREATININE 6.96* 8.18* 7.67*  CALCIUM 9.8 9.7 9.4  PHOS 4.8* 3.6 3.7    Liver Function Tests: Recent Labs  Lab 08/08/17 1330 08/11/17 1620 08/13/17 1315  ALBUMIN 2.5* 2.5* 2.7*   No results for input(s): LIPASE, AMYLASE in the last 168 hours. No results for input(s): AMMONIA in the last 168 hours.  CBC: Recent Labs  Lab 08/08/17 1159 08/11/17 1650 08/13/17 1315  WBC 10.2 12.5* 11.1*  HGB 11.4* 11.4* 11.2*  HCT 37.2* 36.0* 36.0*   MCV 90.5 88.7 89.8  PLT 364 388 343    Cardiac Enzymes: No results for input(s): CKTOTAL, CKMB, CKMBINDEX, TROPONINI in the last 168 hours.  BNP: Invalid input(s): POCBNP  CBG: Recent Labs  Lab 08/11/17 2117 08/12/17 0704 08/13/17 0612 08/13/17 0632 08/14/17 0650  GLUCAP 112* 114* 69 3 95    Microbiology: Results for orders placed or performed during the hospital encounter of 07/19/17  Blood culture (routine x 2)     Status: None   Collection Time: 07/19/17 11:25 PM  Result Value Ref Range Status   Specimen Description BLOOD RIGHT ARM  Final   Special Requests   Final    BOTTLES DRAWN AEROBIC AND ANAEROBIC Blood Culture adequate volume   Culture NO GROWTH 5 DAYS  Final   Report Status 07/25/2017 FINAL  Final  Blood culture (routine x 2)     Status: None   Collection Time: 07/19/17 11:30 PM  Result Value Ref Range Status   Specimen Description BLOOD RIGHT HAND  Final   Special Requests   Final    BOTTLES DRAWN AEROBIC ONLY Blood Culture adequate volume   Culture NO GROWTH 5 DAYS  Final   Report Status 07/25/2017 FINAL  Final  MRSA PCR Screening     Status: None   Collection Time: 08/01/17 11:53  PM  Result Value Ref Range Status   MRSA by PCR NEGATIVE NEGATIVE Final    Comment:        The GeneXpert MRSA Assay (FDA approved for NASAL specimens only), is one component of a comprehensive MRSA colonization surveillance program. It is not intended to diagnose MRSA infection nor to guide or monitor treatment for MRSA infections. Performed at [facility], [place] [address] [place], [place] [address]     Coagulation Studies: No results for input(s): LABPROT, INR in the last 72 hours.  Urinalysis: No results for input(s): COLORURINE, LABSPEC, PHURINE, GLUCOSEU, HGBUR, BILIRUBINUR, KETONESUR, PROTEINUR, UROBILINOGEN, NITRITE, LEUKOCYTESUR in the last 72 hours.  Invalid input(s): APPERANCEUR    Imaging: No results found.   Medications:   . sodium  thiosulfate infusion for calciphylaxis Stopped (08/13/17 2010)   . acetaminophen  650 mg Oral TID WC & HS  . amLODipine  10 mg Oral Daily  . apixaban  5 mg Oral BID  . aspirin  81 mg Oral Daily  . atorvastatin  80 mg Oral q1800  . carvedilol  25 mg Oral BID WC  . cinacalcet  90 mg Oral Q breakfast  . cloNIDine  0.2 mg Transdermal Q Tue  . collagenase   Topical Daily  . divalproex  500 mg Oral Q12H  . escitalopram  10 mg Oral Daily  . ferric citrate  630 mg Oral TID WC  . fluticasone  1 spray Each Nare BID  . gabapentin  100 mg Oral BID  . hydrALAZINE  100 mg Oral Q8H  . hydrocerin   Topical TID  . irbesartan  300 mg Oral Daily  . isosorbide dinitrate  10 mg Oral BID  . lidocaine   Topical QID  . multivitamin  1 tablet Oral QHS  . nicotine  21 mg Transdermal Daily  . oxyCODONE  20 mg Oral Q12H  . sevelamer carbonate  2,400 mg Oral TID WC  . sodium chloride flush  3 mL Intravenous Q12H   acetaminophen, aluminum hydroxide, bisacodyl, camphor-menthol, diphenhydrAMINE, guaiFENesin-dextromethorphan, nitroGLYCERIN, oxyCODONE, polyethylene glycol, prochlorperazine **OR** prochlorperazine **OR** prochlorperazine, senna-docusate, sevelamer carbonate, sodium chloride flush, sodium phosphate  Assessment/ Plan:  Dialysis: MWF NW 4h  98kg (lower at dc) 2/2.25 bath  LUA AVF   Hep 2400 - Hectoral 6mcg IV q HD(D/c'd) - Venofer 100mg  x 10 ordered (4 given) - Mircera 218mcg IV q 2 weeks (last 1/18) - Na thiosulfate 25 gm QHD started this hospitalization -home BP-norvasc 10/ coreg 25 bid/ clon patch 0.2 / hydral 100 tid/ avapro 300      Impression: 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 3  CM/ severe AS, not surg candidate 4  CVA mult, small 5  Aflutter, changing to eliquis ,renal dose per pharm (for #1) 6  HTN mult meds, bp ok 7  Vol under dry, lower edw at dc 8  AMS resolved 9  PVD 10  DM2 11  Bipolar 12  Anemia ckd, Hb good, dc'd Fe load d/t #1 13  CAD sp cath 2V CAD, med Rx 14   EOL - DNR now  Will order dialysis     LOS: 9 Adelle Zachar W @TODAY @11 :16 AM

## 2017-08-15 ENCOUNTER — Inpatient Hospital Stay (HOSPITAL_COMMUNITY): Payer: Self-pay | Admitting: Occupational Therapy

## 2017-08-15 ENCOUNTER — Encounter (HOSPITAL_COMMUNITY): Payer: Self-pay

## 2017-08-15 ENCOUNTER — Encounter (HOSPITAL_COMMUNITY): Payer: Self-pay | Admitting: Physical Therapy

## 2017-08-15 LAB — CBC
HCT: 32.9 % — ABNORMAL LOW (ref 39.0–52.0)
Hemoglobin: 10.1 g/dL — ABNORMAL LOW (ref 13.0–17.0)
MCH: 27.7 pg (ref 26.0–34.0)
MCHC: 30.7 g/dL (ref 30.0–36.0)
MCV: 90.4 fL (ref 78.0–100.0)
PLATELETS: 393 10*3/uL (ref 150–400)
RBC: 3.64 MIL/uL — ABNORMAL LOW (ref 4.22–5.81)
RDW: 18 % — AB (ref 11.5–15.5)
WBC: 10.7 10*3/uL — AB (ref 4.0–10.5)

## 2017-08-15 LAB — RENAL FUNCTION PANEL
Albumin: 2.6 g/dL — ABNORMAL LOW (ref 3.5–5.0)
Anion gap: 15 (ref 5–15)
BUN: 43 mg/dL — AB (ref 6–20)
CALCIUM: 9.9 mg/dL (ref 8.9–10.3)
CO2: 27 mmol/L (ref 22–32)
CREATININE: 6.58 mg/dL — AB (ref 0.61–1.24)
Chloride: 94 mmol/L — ABNORMAL LOW (ref 101–111)
GFR calc Af Amer: 11 mL/min — ABNORMAL LOW (ref >60)
GFR calc non Af Amer: 9 mL/min — ABNORMAL LOW (ref >60)
GLUCOSE: 102 mg/dL — AB (ref 65–99)
Phosphorus: 3.7 mg/dL (ref 2.5–4.6)
Potassium: 4.1 mmol/L (ref 3.5–5.1)
SODIUM: 136 mmol/L (ref 135–145)

## 2017-08-15 LAB — GLUCOSE, CAPILLARY: Glucose-Capillary: 81 mg/dL (ref 65–99)

## 2017-08-15 MED ORDER — DIVALPROEX SODIUM 125 MG PO CSDR: 500.0000 mg | DELAYED_RELEASE_CAPSULE | Freq: Two times a day (BID) | ORAL | 1 refills | Status: AC

## 2017-08-15 MED ORDER — ATORVASTATIN CALCIUM 80 MG PO TABS: 80.0000 mg | ORAL_TABLET | Freq: Every day | ORAL | 0 refills | Status: AC

## 2017-08-15 MED ORDER — IRBESARTAN 300 MG PO TABS: 300.0000 mg | ORAL_TABLET | Freq: Every day | ORAL | 4 refills | Status: AC

## 2017-08-15 MED ORDER — PRAVASTATIN SODIUM 40 MG PO TABS: 40.0000 mg | ORAL_TABLET | Freq: Every evening | ORAL | 3 refills | Status: DC

## 2017-08-15 MED ORDER — HYDRALAZINE HCL 100 MG PO TABS: 100.0000 mg | ORAL_TABLET | Freq: Three times a day (TID) | ORAL | 0 refills | Status: AC

## 2017-08-15 MED ORDER — CLONIDINE 0.2 MG/24HR TD PTWK: 0.2000 mg | MEDICATED_PATCH | TRANSDERMAL | 12 refills | Status: AC

## 2017-08-15 MED ORDER — HEPARIN SODIUM (PORCINE) 1000 UNIT/ML DIALYSIS: 1000.0000 [IU] | INTRAMUSCULAR | Status: DC | PRN

## 2017-08-15 MED ORDER — OXYCODONE HCL 10 MG PO TABS: 10.0000 mg | ORAL_TABLET | ORAL | 0 refills | Status: AC | PRN

## 2017-08-15 MED ORDER — SODIUM CHLORIDE 0.9 % IV SOLN: 100.0000 mL | INTRAVENOUS | Status: DC | PRN

## 2017-08-15 MED ORDER — ALTEPLASE 2 MG IJ SOLR: 2.0000 mg | Freq: Once | INTRAMUSCULAR | Status: DC | PRN

## 2017-08-15 MED ORDER — NICOTINE 21 MG/24HR TD PT24: MEDICATED_PATCH | TRANSDERMAL | 0 refills | Status: DC

## 2017-08-15 MED ORDER — LIDOCAINE-PRILOCAINE 2.5-2.5 % EX CREA: 1.0000 "application " | TOPICAL_CREAM | CUTANEOUS | Status: DC | PRN

## 2017-08-15 MED ORDER — PENTAFLUOROPROP-TETRAFLUOROETH EX AERO: 1.0000 "application " | INHALATION_SPRAY | CUTANEOUS | Status: DC | PRN

## 2017-08-15 MED ORDER — ESCITALOPRAM OXALATE 10 MG PO TABS: 10.0000 mg | ORAL_TABLET | Freq: Every day | ORAL | 0 refills | Status: AC

## 2017-08-15 MED ORDER — APIXABAN 5 MG PO TABS: 5.0000 mg | ORAL_TABLET | Freq: Two times a day (BID) | ORAL | 1 refills | Status: AC

## 2017-08-15 MED ORDER — CARVEDILOL 25 MG PO TABS: 25.0000 mg | ORAL_TABLET | Freq: Two times a day (BID) | ORAL | 3 refills | Status: DC

## 2017-08-15 MED ORDER — LIDOCAINE HCL (PF) 1 % IJ SOLN: 5.0000 mL | INTRAMUSCULAR | Status: DC | PRN

## 2017-08-15 MED ORDER — OXYCODONE HCL 5 MG PO TABS
ORAL_TABLET | ORAL | Status: AC
  Filled 2017-08-15: qty 2

## 2017-08-15 MED ORDER — AMLODIPINE BESYLATE 10 MG PO TABS: 10.0000 mg | ORAL_TABLET | Freq: Every day | ORAL | 0 refills | Status: DC

## 2017-08-15 MED ORDER — CINACALCET HCL 30 MG PO TABS: 90.0000 mg | ORAL_TABLET | Freq: Every day | ORAL | 0 refills | Status: AC

## 2017-08-15 MED ORDER — ISOSORBIDE DINITRATE 10 MG PO TABS: 10.0000 mg | ORAL_TABLET | Freq: Two times a day (BID) | ORAL | 0 refills | Status: AC

## 2017-08-15 MED ORDER — SEVELAMER CARBONATE 800 MG PO TABS: 2400.0000 mg | ORAL_TABLET | Freq: Three times a day (TID) | ORAL | 0 refills | Status: AC

## 2017-08-15 MED ORDER — GABAPENTIN 100 MG PO CAPS: 100.0000 mg | ORAL_CAPSULE | Freq: Two times a day (BID) | ORAL | 0 refills | Status: AC

## 2017-08-15 MED ORDER — OXYCODONE HCL ER 20 MG PO T12A: 20.0000 mg | EXTENDED_RELEASE_TABLET | Freq: Two times a day (BID) | ORAL | 0 refills | Status: AC

## 2017-08-15 NOTE — Progress Notes (Signed)
Social Work  Discharge Note  The overall goal for the admission was met for:   Discharge location: Yes -  Home with wife and family providing 24/7 assistance  Length of Stay: Yes - 11 days (with discharge on 08/16/17)  Discharge activity level: Yes - supervision at w/c level  Home/community participation: Yes  Services provided included: MD, RD, PT, OT, RN, TR, Pharmacy and [place]: Medicare and Private Insurance: [facility]  Follow-up services arranged: Home Health: Therapist, sports, PT, OT via [place], DME: 30" transfer board, ELRs (for existing w/c at home) and drop arm commode via [place] Other: referral also to [facility] and [employer] and Patient/Family has no preference for HH/DME agencies  Comments (or additional information):  Patient/Family verbalized understanding of follow-up arrangements: Yes  Individual responsible for coordination of the follow-up plan: pt/wife  Confirmed correct DME delivered: Julyanna Scholle 08/15/2017    Hiral Lukasiewicz

## 2017-08-15 NOTE — Progress Notes (Signed)
Occupational Therapy Discharge Summary  Patient Details  Name: Hayden [name] MRN: [id number] Date of Birth: [birth year]  Patient has met 11 of 12 long term goals due to improved activity tolerance and ability to compensate for deficits.  Pt made steady progress with BADLs and functional transfers during this admission.  Pt completes bathing/dressing tasks at bed level 2/2 increased LLE pain when placed in dependent position.  Pt currently not showering but practiced slide board transfers to tub bench with son and wife.  Pt utilizes A/P transfer to [facility] from bed.  Pt completes all functional transfers at supervision level.  Pt unable to stand to pull up pants.  Pt's wife and son have been present for therapy sessions. Patient to discharge at overall Supervision level.  Patient's care partner is independent to provide the necessary physical and cognitive assistance at discharge.    Reasons goals not met: Pt is unable to stand at this time due to LLE pain.  Recommendation:  Patient will benefit from ongoing skilled OT services in home health setting to continue to advance functional skills in the area of BADL.  Equipment: No equipment provided Pt owns BSC and tub transfer bench  Reasons for discharge: treatment goals met  Patient/family agrees with progress made and goals achieved: Yes  OT Discharge Vision Baseline Vision/History: Wears glasses Wears Glasses: At all times Patient Visual Report: No change from baseline Vision Assessment?: No apparent visual deficits Perception  Perception: Within Functional Limits Praxis Praxis: Intact Cognition Overall Cognitive Status: History of cognitive impairments - at baseline Arousal/Alertness: Awake/alert Orientation Level: Oriented X4 Attention: Sustained Sustained Attention: Appears intact Memory: Impaired Memory Impairment: Decreased recall of new information Awareness: Impaired Awareness Impairment: Emergent impairment Safety/Judgment:  Impaired Sensation Sensation Light Touch: Appears Intact Stereognosis: Appears Intact Hot/Cold: Appears Intact Proprioception: Appears Intact Motor  Motor Motor: Within Functional Limits Motor - Skilled Clinical Observations: generalized weakness and acute pain     Trunk/Postural Assessment  Cervical Assessment Cervical Assessment: Within Functional Limits Thoracic Assessment Thoracic Assessment: Within Functional Limits Lumbar Assessment Lumbar Assessment: Within Functional Limits Postural Control Postural Control: Within Functional Limits  Balance Static Sitting Balance Static Sitting - Level of Assistance: 6: Modified independent (Device/Increase time) Dynamic Sitting Balance Dynamic Sitting - Level of Assistance: 5: Stand by assistance Extremity/Trunk Assessment RUE Assessment RUE Assessment: Within Functional Limits LUE Assessment LUE Assessment: Within Functional Limits   See Function Navigator for Current Functional Status.  Leotis Shames [facility] 08/15/2017, 6:53 AM

## 2017-08-15 NOTE — Progress Notes (Signed)
Physical Therapy Discharge Summary  Patient Details  Name: Hayden [name] MRN: [id number] Date of Birth: 20-Nov-1970  Today's Date: 08/15/2017 PT Individual Time: 1000-1100 PT Individual Time Calculation (min): 60 min    Patient has met 8 of 8 long term goals due to improved activity tolerance, improved balance, improved postural control, increased strength, decreased pain, ability to compensate for deficits and improved awareness.  Patient to discharge at a wheelchair level Supervision.   Patient's care partner is independent to provide the necessary physical assistance at discharge.  Reasons goals not met: All goals met  Recommendation:  Patient will benefit from ongoing skilled PT services in home health setting to continue to advance safe functional mobility, address ongoing impairments in strength, balance, activity tolerance, ROM, and minimize fall risk.  Equipment: transfer board, elevating leg rests  Reasons for discharge: treatment goals met and discharge from hospital  Patient/family agrees with progress made and goals achieved: Yes  PT Discharge Precautions/Restrictions Precautions Precautions: Fall Precaution Comments: pain in left LE Required Braces or Orthoses: (RLE prosthetic) Restrictions Weight Bearing Restrictions: No Pain Pain Assessment Pain Assessment: 0-10 Pain Score: Asleep Pain Type: Acute pain Pain Location: Leg Pain Orientation: Left Pain Descriptors / Indicators: Aching Pain Frequency: Intermittent Pain Onset: On-going Patients Stated Pain Goal: 2 Pain Intervention(s): Medication (See eMAR);Emotional support Vision/Perception  Perception Perception: Within Functional Limits Praxis Praxis: Intact  Cognition Overall Cognitive Status: History of cognitive impairments - at baseline Arousal/Alertness: Awake/alert Orientation Level: Oriented X4 Attention: Sustained Sustained Attention: Appears intact Memory: Impaired Memory Impairment:  Decreased recall of new information Awareness: Impaired Awareness Impairment: Emergent impairment Safety/Judgment: Impaired Sensation Sensation Light Touch: Appears Intact Stereognosis: Appears Intact Hot/Cold: Appears Intact Proprioception: Appears Intact Motor  Motor Motor: Within Functional Limits Motor - Skilled Clinical Observations: generalized weakness and acute pain  Mobility Bed Mobility Bed Mobility: Sit to Supine Supine to Sit: 6: Modified independent (Device/Increase time) Sit to Supine: 6: Modified independent (Device/Increase time) Transfers Transfers: Yes Sit to Stand: 4: Min assist;Other/comment(in parallel bars) Sit to Stand Details: Verbal cues for technique;Verbal cues for precautions/safety;Tactile cues for weight shifting;Tactile cues for weight beaing Sit to Stand Details (indicate cue type and reason): with RLE prosthetic; unable to demo standing tolerance >5 sec d/t LLE pain Anterior-Posterior Transfer: 5: Supervision Anterior-Posterior Transfer Details: Verbal cues for technique;Verbal cues for precautions/safety Lateral/Scoot Transfers: 5: Supervision;With armrests removed;With slide board Lateral/Scoot Transfer Details: Verbal cues for precautions/safety;Verbal cues for technique Locomotion  Ambulation Ambulation: No(unable to progress standing>gait d/t significant LLE pain) Gait Gait: No Stairs / Additional Locomotion Stairs: No Wheelchair Mobility Wheelchair Mobility: Yes Wheelchair Assistance: 6: Modified independent (Device/Increase time) Environmental health practitioner: Both upper extremities Wheelchair Parts Management: Supervision/cueing Distance: >150'  Trunk/Postural Assessment  Cervical Assessment Cervical Assessment: Within Functional Limits Thoracic Assessment Thoracic Assessment: Within Functional Limits Lumbar Assessment Lumbar Assessment: Within Functional Limits Postural Control Postural Control: Within Functional Limits   Balance Balance Balance Assessed: Yes Static Sitting Balance Static Sitting - Level of Assistance: 6: Modified independent (Device/Increase time) Dynamic Sitting Balance Dynamic Sitting - Level of Assistance: 6: Modified independent (Device/Increase time) Static Standing Balance Static Standing - Level of Assistance: 4: Min assist(sit <>stand in parallel bars <5 sec; limited standing tolerance d/t LLE pain) Extremity Assessment  RUE Assessment RUE Assessment: Within Functional Limits LUE Assessment LUE Assessment: Within Functional Limits RLE Assessment RLE Assessment: Exceptions to [facility] RLE PROM (degrees) RLE Overall PROM Comments: in L sidelying, hip extension approx 5 degrees RLE Strength RLE Overall Strength Comments:  grossly in L side lying: 4/5 hip flex/ext/abd; NT below d/t amputation LLE Assessment LLE Assessment: Exceptions to [facility] LLE Strength LLE Overall Strength Comments: unable to tolerate formal testing d/t pain; grossly 3/5 to 4/5 throughout as observed in functional tasks  Skilled Therapeutic Intervention: Pt received seated in bed with wife present; c/o pain as above and agreeable to treatment. Assessed all mobility as above with S/modI overall at w/c level. Pt very limited throughout session d/t pain, requiring increased time and encouragement to perform mobility tasks. Pt reporting "I'm just over all of this"; offered emotional support and encouragement as pt is to d/c tomorrow and nearly finished with all therapy. Performed prone lying exercises and PROM to hip flexors; educated pt and wife in importance of prone lying and maintaining hip flexor ROM to carryover into prosthetic use. Pt and wife with no further questions or concerns regarding d/c at this time. Remained seated in w/c, all needs in reach at completion of session.   See Function Navigator for Current Functional Status.  [name] 08/15/2017, 7:55 AM

## 2017-08-15 NOTE — Discharge Instructions (Signed)
Inpatient Rehab Discharge Instructions  Hayden [name] Discharge date and time: No discharge date for patient encounter.   Activities/Precautions/ Functional Status: Activity: activity as tolerated Diet: diabetic diet Wound Care: none needed Functional status:  ___ No restrictions     ___ Walk up steps independently ___ 24/7 supervision/assistance   ___ Walk up steps with assistance ___ Intermittent supervision/assistance  ___ Bathe/dress independently ___ Walk with walker     ___ Bathe/dress with assistance ___ Walk Independently    _x STROKE/TIA DISCHARGE INSTRUCTIONS SMOKING Cigarette smoking nearly doubles your risk of having a stroke & is the single most alterable risk factor  If you smoke or have smoked in the last 12 months, you are advised to quit smoking for your health.  Most of the excess cardiovascular risk related to smoking disappears within a year of stopping.  Ask you doctor about anti-smoking medications  [place] Quit Line: 1-800-QUIT NOW  Free Smoking Cessation Classes [phone]  CHOLESTEROL Know your levels; limit fat & cholesterol in your diet  Lipid Panel     Component Value Date/Time   CHOL 144 05/03/2017 0556   TRIG 61 05/03/2017 0556   HDL 53 05/03/2017 0556   CHOLHDL 2.7 05/03/2017 0556   VLDL 12 05/03/2017 0556   LDLCALC 79 05/03/2017 0556      Many patients benefit from treatment even if their cholesterol is at goal.  Goal: Total Cholesterol (CHOL) less than 160  Goal:  Triglycerides (TRIG) less than 150  Goal:  HDL greater than 40  Goal:  LDL (LDLCALC) less than 100   BLOOD PRESSURE American Stroke Association blood pressure target is less that 120/80 mm/Hg  Your discharge blood pressure is:  BP: (!) 162/91  Monitor your blood pressure  Limit your salt and alcohol intake  Many individuals will require more than one medication for high blood pressure  DIABETES (A1c is a blood sugar average for last 3 months) Goal HGBA1c is under 7%  (HBGA1c is blood sugar average for last 3 months)  Diabetes: No known diagnosis of diabetes    Lab Results  Component Value Date   HGBA1C 5.4 06/20/2015     Your HGBA1c can be lowered with medications, healthy diet, and exercise.  Check your blood sugar as directed by your physician  Call your physician if you experience unexplained or low blood sugars.  PHYSICAL ACTIVITY/REHABILITATION Goal is 30 minutes at least 4 days per week  Activity: Increase activity slowly, Therapies: Physical Therapy: Home Health Return to work:   Activity decreases your risk of heart attack and stroke and makes your heart stronger.  It helps control your weight and blood pressure; helps you relax and can improve your mood.  Participate in a regular exercise program.  Talk with your doctor about the best form of exercise for you (dancing, walking, swimming, cycling).  DIET/WEIGHT Goal is to maintain a healthy weight  Your discharge diet is: Diet renal/carb modified with fluid restriction Diet-HS Snack? Nothing; Fluid restriction: 1200 mL Fluid; Room service appropriate? Yes; Fluid consistency: Thin  liquids Your height is:  Height: 5\' 10"  (177.8 cm) Your current weight is: Weight: (Pt will be weighed at the dialysis, the bed scale is broke) Your Body Mass Index (BMI) is:  BMI (Calculated): 31.73  Following the type of diet specifically designed for you will help prevent another stroke.  Your goal weight range is:    Your goal Body Mass Index (BMI) is 19-24.  Healthy food habits can help  reduce 3 risk factors for stroke:  High cholesterol, hypertension, and excess weight.  RESOURCES Stroke/Support Group:  Call [phone]   STROKE EDUCATION PROVIDED/REVIEWED AND GIVEN TO PATIENT Stroke warning signs and symptoms How to activate emergency medical system (call 911). Medications prescribed at discharge. Need for follow-up after discharge. Personal risk factors for stroke. Pneumonia vaccine given:    Flu vaccine given:  My questions have been answered, the writing is legible, and I understand these instructions.  I will adhere to these goals & educational materials that have been provided to me after my discharge from the hospital.   __ Shower independently ___ Walk with assistance    ___ Shower with assistance ___ No alcohol     ___ Return to work/school ________      COMMUNITY REFERRALS UPON DISCHARGE:    Home Health:   PT     OT     RN                      Agency:  [facility] Phone: [phone]   Medical Equipment/Items Ordered:  Transfer board, drop arm commode                                                      Agency/Supplier:  [employer] @ [phone]  Other:  Palliative Care via [facility] and [place] for symptom management @ [phone]        Special Instructions:    My questions have been answered and I understand these instructions. I will adhere to these goals and the provided educational materials after my discharge from the hospital.  Patient/Caregiver Signature _______________________________ Date __________  Clinician Signature _______________________________________ Date __________  Please bring this form and your medication list with you to all your follow-up doctor's appointments. Information on my medicine - ELIQUIS (apixaban)  This medication education was reviewed with me or my healthcare representative as part of my discharge preparation.  The pharmacist that spoke with me during my hospital stay was:  Charlton Haws, [facility]  Why was Eliquis prescribed for you? Eliquis was prescribed for you to reduce the risk of a blood clot forming that can cause a stroke if you have a medical condition called atrial fibrillation (a type of irregular heartbeat).  What do You need to know about Eliquis ? Take your Eliquis TWICE DAILY - one tablet in the morning and one tablet in the evening with or without  food. If you have difficulty swallowing the tablet whole please discuss with your pharmacist how to take the medication safely.  Take Eliquis exactly as prescribed by your doctor and DO NOT stop taking Eliquis without talking to the doctor who prescribed the medication.  Stopping may increase your risk of developing a stroke.  Refill your prescription before you run out.  After discharge, you should have regular check-up appointments with your healthcare provider that is prescribing your Eliquis.  In the future your dose may need to be changed if your kidney function or weight changes by a significant amount or as you get older.  What do you do if you miss a dose? If you miss a dose, take it as soon as you remember on the same day and resume taking twice daily.  Do not take more than one dose  of ELIQUIS at the same time to make up a missed dose.  Important Safety Information A possible side effect of Eliquis is bleeding. You should call your healthcare provider right away if you experience any of the following: ? Bleeding from an injury or your nose that does not stop. ? Unusual colored urine (red or dark brown) or unusual colored stools (red or black). ? Unusual bruising for unknown reasons. ? A serious fall or if you hit your head (even if there is no bleeding).  Some medicines may interact with Eliquis and might increase your risk of bleeding or clotting while on Eliquis. To help avoid this, consult your healthcare provider or pharmacist prior to using any new prescription or non-prescription medications, including herbals, vitamins, non-steroidal anti-inflammatory drugs (NSAIDs) and supplements.  This website has more information on Eliquis (apixaban): http://[email or web address].com/eliquis/home

## 2017-08-15 NOTE — Progress Notes (Signed)
[facility] ROUNDING NOTE   Subjective:   End stage renal disease seen with no complaints  MWF dialysis. 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 He has severe Aortic stenosis and not a surgical candidate  He has history of multiple infarcts and also coronary artery disease He also has atrial flutter and is currently on eliquis    More awake and alert  Complains of pain over LLE wound   Doing a little better    Objective:  Vital signs in last 24 hours:  Temp:  [97.4 F (36.3 C)-98.2 F (36.8 C)] 97.4 F (36.3 C) (02/15 0529) Pulse Rate:  [60-74] 60 (02/15 0529) Resp:  [18-20] 18 (02/15 0529) BP: (153-162)/(76-91) 162/91 (02/15 0529) SpO2:  [94 %-97 %] 94 % (02/15 0529)  Weight change:  Filed Weights   08/13/17 1315 08/13/17 1756 08/14/17 0500  Weight: 227 lb 8.2 oz (103.2 kg) 222 lb 3.6 oz (100.8 kg) 221 lb 1.9 oz (100.3 kg)    Intake/Output: I/O last 3 completed shifts: In: 600 [P.O.:600] Out: 250 [Urine:250]   Intake/Output this shift:  Total I/O In: 240 [P.O.:240] Out: -   Alert, pleasant, nad No jvd Chest cta bilat RRR 3/6 holosyst M Abd soft ntnd obese Ext serpentine skin necrosis R shin/ calf LUA AVF NF, ox3     Basic Metabolic Panel: Recent Labs  Lab 08/08/17 1330 08/11/17 1620 08/13/17 1315  NA 134* 135 132*  K 4.6 4.7 4.5  CL 94* 92* 93*  CO2 26 24 22   GLUCOSE 87 80 99  BUN 34* 53* 55*  CREATININE 6.96* 8.18* 7.67*  CALCIUM 9.8 9.7 9.4  PHOS 4.8* 3.6 3.7    Liver Function Tests: Recent Labs  Lab 08/08/17 1330 08/11/17 1620 08/13/17 1315  ALBUMIN 2.5* 2.5* 2.7*   No results for input(s): LIPASE, AMYLASE in the last 168 hours. No results for input(s): AMMONIA in the last 168 hours.  CBC: Recent Labs  Lab 08/08/17 1159 08/11/17 1650 08/13/17 1315  WBC 10.2 12.5* 11.1*  HGB 11.4* 11.4* 11.2*  HCT 37.2* 36.0* 36.0*  MCV 90.5 88.7 89.8  PLT 364 388 343    Cardiac Enzymes: No results for input(s): CKTOTAL,  CKMB, CKMBINDEX, TROPONINI in the last 168 hours.  BNP: Invalid input(s): POCBNP  CBG: Recent Labs  Lab 08/12/17 0704 08/13/17 0612 08/13/17 0632 08/14/17 0650 08/15/17 0702  GLUCAP 114* 69 89 95 81    Microbiology: Results for orders placed or performed during the hospital encounter of 07/19/17  Blood culture (routine x 2)     Status: None   Collection Time: 07/19/17 11:25 PM  Result Value Ref Range Status   Specimen Description BLOOD RIGHT ARM  Final   Special Requests   Final    BOTTLES DRAWN AEROBIC AND ANAEROBIC Blood Culture adequate volume   Culture NO GROWTH 5 DAYS  Final   Report Status 07/25/2017 FINAL  Final  Blood culture (routine x 2)     Status: None   Collection Time: 07/19/17 11:30 PM  Result Value Ref Range Status   Specimen Description BLOOD RIGHT HAND  Final   Special Requests   Final    BOTTLES DRAWN AEROBIC ONLY Blood Culture adequate volume   Culture NO GROWTH 5 DAYS  Final   Report Status 07/25/2017 FINAL  Final  MRSA PCR Screening     Status: None   Collection Time: 08/01/17 11:53 PM  Result Value Ref Range Status   MRSA by PCR NEGATIVE NEGATIVE  Final    Comment:        The GeneXpert MRSA Assay (FDA approved for NASAL specimens only), is one component of a comprehensive MRSA colonization surveillance program. It is not intended to diagnose MRSA infection nor to guide or monitor treatment for MRSA infections. Performed at [facility], [place] [address] [place], [place] [address]     Coagulation Studies: No results for input(s): LABPROT, INR in the last 72 hours.  Urinalysis: No results for input(s): COLORURINE, LABSPEC, PHURINE, GLUCOSEU, HGBUR, BILIRUBINUR, KETONESUR, PROTEINUR, UROBILINOGEN, NITRITE, LEUKOCYTESUR in the last 72 hours.  Invalid input(s): APPERANCEUR    Imaging: No results found.   Medications:   . sodium thiosulfate infusion for calciphylaxis Stopped (08/13/17 2010)   . acetaminophen  650 mg Oral TID WC  & HS  . amLODipine  10 mg Oral Daily  . apixaban  5 mg Oral BID  . aspirin  81 mg Oral Daily  . atorvastatin  80 mg Oral q1800  . carvedilol  25 mg Oral BID WC  . cinacalcet  90 mg Oral Q breakfast  . cloNIDine  0.2 mg Transdermal Q Tue  . collagenase   Topical Daily  . divalproex  500 mg Oral Q12H  . escitalopram  10 mg Oral Daily  . ferric citrate  630 mg Oral TID WC  . fluticasone  1 spray Each Nare BID  . gabapentin  100 mg Oral BID  . hydrALAZINE  100 mg Oral Q8H  . hydrocerin   Topical TID  . irbesartan  300 mg Oral Daily  . isosorbide dinitrate  10 mg Oral BID  . lidocaine   Topical QID  . multivitamin  1 tablet Oral QHS  . nicotine  21 mg Transdermal Daily  . oxyCODONE  20 mg Oral Q12H  . sevelamer carbonate  2,400 mg Oral TID WC   acetaminophen, aluminum hydroxide, bisacodyl, camphor-menthol, diphenhydrAMINE, guaiFENesin-dextromethorphan, nitroGLYCERIN, oxyCODONE, polyethylene glycol, prochlorperazine **OR** prochlorperazine **OR** prochlorperazine, senna-docusate, sevelamer carbonate, sodium phosphate  Assessment/ Plan:  Dialysis: MWF NW 4h  98kg (lower at dc) 2/2.25 bath  LUA AVF   Hep 2400 - Hectoral 54mcg IV q HD(D/c'd) - Venofer 100mg  x 10 ordered (4 given) - Mircera 28mcg IV q 2 weeks (last 1/18) - Na thiosulfate 25 gm QHD started this hospitalization -home BP-norvasc 10/ coreg 25 bid/ clon patch 0.2 / hydral 100 tid/ avapro 300      Impression: 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 3  CM/ severe AS, not surg candidate 4  CVA mult, small 5  Aflutter, changing to eliquis ,renal dose per pharm (for #1) 6  HTN mult meds, bp ok 7  Vol under dry, lower edw at dc 8  AMS resolved 9  PVD 10  DM2 11  Bipolar 12  Anemia ckd, Hb good, dc'd Fe load d/t #1 13  CAD sp cath 2V CAD, med Rx 14  EOL - DNR now  Plan dialysis today     LOS: 10 Tawney Vanorman W @TODAY @11 :37 AM

## 2017-08-15 NOTE — Progress Notes (Signed)
[place] PHYSICAL MEDICINE & REHABILITATION     PROGRESS NOTE    Subjective/Complaints: Had questions about urine being dark. Pain better controlled. Excited to be going home tomorrow  ROS: pt denies nausea, vomiting, diarrhea, cough, shortness of breath or chest pain   Objective: Vital Signs: Blood pressure (!) 162/91, pulse 60, temperature (!) 97.4 F (36.3 C), temperature source Oral, resp. rate 18, height 5\' 10"  (1.778 m), weight 100.3 kg (221 lb 1.9 oz), SpO2 94 %.    No results found. Recent Labs    08/13/17 1315  WBC 11.1*  HGB 11.2*  HCT 36.0*  PLT 343   Recent Labs    08/13/17 1315  NA 132*  K 4.5  CL 93*  GLUCOSE 99  BUN 55*  CREATININE 7.67*  CALCIUM 9.4   CBG (last 3)  Recent Labs    08/13/17 0632 08/14/17 0650 08/15/17 0702  GLUCAP 89 95 81    Wt Readings from Last 3 Encounters:  08/14/17 100.3 kg (221 lb 1.9 oz)  08/04/17 96 kg (211 lb 10.3 oz)  06/03/17 96.6 kg (213 lb)    Physical Exam:  Constitutional: He appears well-developed and well-nourished.  HENT:  Head: Normocephalic.  Eyes: Pupils are equal, round, and reactive to light.  Neck: Normal range of motion.  Cardiovascular: RRR without murmur. No JVD      Chest: CTA Bilaterally without wheezes or rales. Normal effort   GI: Soft. He exhibits no distension. There is no tenderness. Abdomen a little more protuberant Musculoskeletal: He exhibits tr+ edema.  Neurological: No cranial nerve deficit.  Bilateral UE's 4/5. RLE 4-/5 HF. LLE 3 to 3+/5 with limitations due to pain. Stable exam Skin:  Tattoos, LLE erythema/eschar unchanged. Gluteal wound with dressing Psychiatric:  Cooperative but just waking up   Assessment/Plan: 1. 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 Physiatrist is providing close team supervision and 24 hour management of active medical problems  listed below. Physiatrist and rehab team continue to assess barriers to discharge/monitor patient progress toward functional and medical goals.  Function:  Bathing Bathing position   Position: Bed  Bathing parts Body parts bathed by patient: Right arm, Left arm, Chest, Abdomen, Front perineal area, Right upper leg, Left upper leg, Back Body parts bathed by helper: Back  Bathing assist Assist Level: Supervision or verbal cues, Set up   Set up : To obtain items  Upper Body Dressing/Undressing Upper body dressing   What is the patient wearing?: Pull over shirt/dress     Pull over shirt/dress - Perfomed by patient: Thread/unthread right sleeve, Thread/unthread left sleeve, Put head through opening, Pull shirt over trunk          Upper body assist Assist Level: Supervision or verbal cues      Lower Body Dressing/Undressing Lower body dressing   What is the patient wearing?: Non-skid slipper socks, Pants     Pants- Performed by patient: Thread/unthread right pants leg, Thread/unthread left pants leg Pants- Performed by helper: Thread/unthread right pants leg, Thread/unthread left pants leg, Pull pants up/down(son assisting) Non-skid slipper socks- Performed by patient: Don/doff left sock                    Lower body assist Assist for lower body dressing: Touching or steadying assistance (Pt > 75%)      Toileting Toileting Toileting activity did not occur: No continent bowel/bladder event  Toileting steps completed by helper: Adjust clothing prior to toileting, Performs perineal hygiene, Adjust clothing after toileting    Toileting assist Assist level: Touching or steadying assistance (Pt.75%)   Transfers Chair/bed transfer   Chair/bed transfer method: Anterior/posterior Chair/bed transfer assist level: Supervision or verbal cues(son holding w/c) Chair/bed transfer assistive device: Armrests     Locomotion Ambulation Ambulation activity did not occur:  Safety/medical concerns         Wheelchair   Type: Manual Max wheelchair distance: 100 Assist Level: Supervision or verbal cues  Cognition Comprehension Comprehension assist level: Understands basic 90% of the time/cues < 10% of the time  Expression Expression assist level: Expresses basic needs/ideas: With extra time/assistive device, Expresses basic needs/ideas: With no assist  Social Interaction Social Interaction assist level: Interacts appropriately 90% of the time - Needs monitoring or encouragement for participation or interaction.  Problem Solving Problem solving assist level: Solves basic 90% of the time/requires cueing < 10% of the time  Memory Memory assist level: Recognizes or recalls 90% of the time/requires cueing < 10% of the time   Medical Problem List and Plan:  1. Functional and mobility deficits secondary to debility and cardio-embolic CVA/encephalopathy  -continue therapies -Working towards dc 2/16 -Patient to see Rehab MD in the office for transitional care encounter in 1-2 weeks.  2. Anticoagulation: Pharmaceutical: Coumadin therapeutic. Should stay on medication for stroke prophylaxis  3. Chronic back pain/Pain Management:   Oxycodone  prn during the day with gabapentin bid.      -Continue Oxy CR at 20 mg every 12 hours-observe closely for tolerance--   -Continue breakthrough oxycodone    -continue with other techniques for pain relief including relaxation, tactile feedback     4. Bipolar disorder/Mood: continue Lexapro and depakote. LCSW to follow for evaluation and support.  5. Neuropsych: This patient is not capable of making decisions on his own behalf.  6. Skin/Wound Care: recent bx with vasculitis c/w calciphylaxis  -renvela/thiosulfate with HDper renal recs  -pain mgt  -cover left leg daily with non-adherent dressing. Can remove at night -local dressings elsewhere, nutrition important 7. Fluids/Electrolytes/Nutrition: strict I/O. Renal diet with 1200 cc  FR/day.    -small amount of concentrated urine this morning 8. CAD/Systolic CHF with severe AS, MS/a-flutter: deemed non-operative candidate  -Treated medically with ASA, Lipitor, Coreg and Isordil  -daily weights, volume mgt ongoing -  9. ESRD: Continue HD MWF. Scheduled at end of the day to help with activity tolerance.     -volume mg per renal Filed Weights   08/13/17 1315 08/13/17 1756 08/14/17 0500  Weight: 103.2 kg (227 lb 8.2 oz) 100.8 kg (222 lb 3.6 oz) 100.3 kg (221 lb 1.9 oz)    10. HTN: Monitor BID. Continue Apresoline, Avapro, Norvasc, Coreg, Catapres and Isordil.  11. Tobacco abuse: Continue nicotine patch.  12 . H/o Seizure disorder: On Depakote.  13. Neuropathy: continue gabapentin bid.  14. Anemia of chronic disease: Stable.  15. T2DM: Diet controlled at present    -Continue to monitor BS ac/hs. --- remains controlled at present   -Use SSI  PRN   LOS (Days) 10 A FACE TO FACE EVALUATION WAS PERFORMED  Meredith Staggers, MD 08/15/2017 8:37 AM

## 2017-08-15 NOTE — Progress Notes (Signed)
Occupational Therapy Note  Patient Details  Name: Hayden [name] MRN: [id number] Date of Birth: Dec 09, 1970  Today's Date: 08/15/2017 OT Individual Time: 1100-1200 OT Individual Time Calculation (min): 60 min   Pain: pt c/o occasional discomfort from sitting but was able to adjust position in his chair  Pt seen this session with his wife to practice a HEP for UE strength using equipment pt has at home.  Pt worked on UE strength with propelling his w/c to gym.  Pt worked on a cardio program using fast reps of 15 while moving a basket ball through various planes of movement ( sh press, chest press, tricep extension, bicep flex bring ball to alt shoulders, trunk rotation with lateral reaches and then diagonal reaches, arm circles).  Pt tolerated exercises well.  Pt then worked on strengthening with Level 3 theraband with scapular retraction with double arm pulls, single arm rows.  Educated pt that these were important exercises for postural control.  Pt then used level 2 band for tricep ext, bicep curls.   He then self propelled w/c back to room with all needs met.   [name] 08/15/2017, 12:41 PM

## 2017-08-15 NOTE — Progress Notes (Signed)
Occupational Therapy Session Note  Patient Details  Name: Hayden [name] MRN: [id number] Date of Birth: [birth year]  Today's Date: 08/15/2017 OT Individual Time: 4098-1191 OT Individual Time Calculation (min): 55 min    Short Term Goals: Week 1:  OT Short Term Goal 1 (Week 1): Pt will transfer to BSC/ toilet with min A  OT Short Term Goal 2 (Week 1): Pt will thread LB clothing with setup  OT Short Term Goal 3 (Week 1): Pt will tolerate left LE being dependent in prep for transfer/ ADL task OT Short Term Goal 4 (Week 1): Pt will perform bed mobility with mod I in prep for ADL tasks   Skilled Therapeutic Interventions/Progress Updates:    Pt's wife present for education.  Discussed bathing/dressing at bed level.  Pt completed tasks at supervision level after setup.  Pt practiced A/P transfers to [facility] and w/c.  Pt practiced tub bench transfers with slide board.  Pt's wife steadied slide board and w/c during transfers.  Pt and wife pleased with progress.  Pt and wife verbalized/demonstrated understanding of recommendations. Pt remained in bed with wife present.    Therapy Documentation Precautions:  Precautions Precautions: Fall Precaution Comments: pain in left LE Required Braces or Orthoses: (RLE prosthetic) Restrictions Weight Bearing Restrictions: No RLE Weight Bearing: Weight bearing as tolerated  Pain: Pt stated pain was "tolerable" at beginning of session but c/o increased buttocks pain with activity; repositioned See Function Navigator for Current Functional Status.   Therapy/Group: Individual Therapy  Leroy Libman 08/15/2017, 10:04 AM

## 2017-08-16 NOTE — Progress Notes (Signed)
[place] PHYSICAL MEDICINE & REHABILITATION     PROGRESS NOTE    Subjective/Complaints: Had a good night.  Denies any new issues.  Feels that pain control is better.  ROS: pt denies nausea, vomiting, diarrhea, cough, shortness of breath or chest pain   Objective: Vital Signs: Blood pressure (!) 149/78, pulse 76, temperature 98.4 F (36.9 C), temperature source Oral, resp. rate 18, height 5\' 10"  (1.778 m), weight 110 kg (242 lb 8.1 oz), SpO2 95 %.    No results found. Recent Labs    08/13/17 1315 08/15/17 1200  WBC 11.1* 10.7*  HGB 11.2* 10.1*  HCT 36.0* 32.9*  PLT 343 393   Recent Labs    08/13/17 1315 08/15/17 1243  NA 132* 136  K 4.5 4.1  CL 93* 94*  GLUCOSE 99 102*  BUN 55* 43*  CREATININE 7.67* 6.58*  CALCIUM 9.4 9.9   CBG (last 3)  Recent Labs    08/14/17 0650 08/15/17 0702  GLUCAP 95 81    Wt Readings from Last 3 Encounters:  08/16/17 110 kg (242 lb 8.1 oz)  08/04/17 96 kg (211 lb 10.3 oz)  06/03/17 96.6 kg (213 lb)    Physical Exam:  Constitutional: He appears well-developed and well-nourished.  HENT:  Head: Normocephalic.  Eyes: Pupils are equal, round, and reactive to light.  Neck: Normal range of motion.  Cardiovascular: RRR without murmur. No JVD      Chest: CTA Bilaterally without wheezes or rales. Normal effort    GI: Soft. He exhibits no distension. There is no tenderness. Abdomen a little more protuberant Musculoskeletal: He exhibits tr+ edema.  Neurological: No cranial nerve deficit.  Bilateral UE's 4/5. RLE 4-/5 HF. LLE 3 to 3+/5 with limitations due to pain. Stable exam Skin:  Tattoos, left leg wound stable with eschar.  Other wounds intact/unchanged Psychiatric:  Cooperative but just waking up   Assessment/Plan: 1. 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 Physiatrist is providing close team supervision and 24  hour management of active medical problems listed below. Physiatrist and rehab team continue to assess barriers to discharge/monitor patient progress toward functional and medical goals.  Function:  Bathing Bathing position   Position: Bed  Bathing parts Body parts bathed by patient: Right arm, Left arm, Chest, Abdomen, Front perineal area, Right upper leg, Left upper leg, Back Body parts bathed by helper: Back  Bathing assist Assist Level: Set up   Set up : To obtain items  Upper Body Dressing/Undressing Upper body dressing   What is the patient wearing?: Pull over shirt/dress     Pull over shirt/dress - Perfomed by patient: Thread/unthread right sleeve, Thread/unthread left sleeve, Put head through opening, Pull shirt over trunk          Upper body assist Assist Level: More than reasonable time      Lower Body Dressing/Undressing Lower body dressing   What is the patient wearing?: Non-skid slipper socks, Pants     Pants- Performed by patient: Thread/unthread right pants leg, Thread/unthread left pants leg, Pull pants up/down Pants- Performed by helper: Thread/unthread right pants leg, Thread/unthread left pants leg, Pull pants up/down(son assisting) Non-skid slipper socks- Performed by patient: Don/doff left sock                    Lower body assist Assist for lower body dressing: Supervision or verbal cues  Toileting Toileting Toileting activity did not occur: No continent bowel/bladder event Toileting steps completed by patient: Adjust clothing prior to toileting, Adjust clothing after toileting, Performs perineal hygiene Toileting steps completed by helper: Adjust clothing prior to toileting, Performs perineal hygiene, Adjust clothing after toileting    Toileting assist Assist level: Supervision or verbal cues   Transfers Chair/bed transfer   Chair/bed transfer method: Anterior/posterior Chair/bed transfer assist level: Supervision or verbal  cues Chair/bed transfer assistive device: Armrests     Locomotion Ambulation Ambulation activity did not occur: Safety/medical concerns         Wheelchair   Type: Manual Max wheelchair distance: 150 Assist Level: No help, No cues, assistive device, takes more than reasonable amount of time  Cognition Comprehension Comprehension assist level: Understands complex 90% of the time/cues 10% of the time  Expression Expression assist level: Expresses complex ideas: With extra time/assistive device  Social Interaction Social Interaction assist level: Interacts appropriately 90% of the time - Needs monitoring or encouragement for participation or interaction.  Problem Solving Problem solving assist level: Solves basic 90% of the time/requires cueing < 10% of the time  Memory Memory assist level: Recognizes or recalls 90% of the time/requires cueing < 10% of the time   Medical Problem List and Plan:  1. Functional and mobility deficits secondary to debility and cardio-embolic CVA/encephalopathy  -continue therapies -Discharge home today -Patient to see Rehab MD in the office for transitional care encounter in 1-2 weeks.  2. Anticoagulation: Pharmaceutical: Coumadin therapeutic. Should stay on medication for stroke prophylaxis  3. Chronic back pain/Pain Management:   Oxycodone  prn during the day with gabapentin bid.      -Continue Oxy CR at 20 mg every 12 hours-observe closely for tolerance--   -Continue breakthrough oxycodone    -Improved control on current regimen     4. Bipolar disorder/Mood: continue Lexapro and depakote. LCSW to follow for evaluation and support.  5. Neuropsych: This patient is not capable of making decisions on his own behalf.  6. Skin/Wound Care: recent bx with vasculitis c/w calciphylaxis  -renvela/thiosulfate with HDper renal recs  -pain mgt  -cover left leg daily with non-adherent dressing. Can remove at night -local dressings elsewhere, nutrition  important--reinforced wound care needs with patient and wife 7. Fluids/Electrolytes/Nutrition: strict I/O. Renal diet with 1200 cc FR/day.    -small amount of concentrated urine this morning 8. CAD/Systolic CHF with severe AS, MS/a-flutter: deemed non-operative candidate  -Treated medically with ASA, Lipitor, Coreg and Isordil  -daily weights, volume mgt ongoing -  9. ESRD: Continue HD MWF. Scheduled at end of the day to help with activity tolerance.     -volume mg per renal Filed Weights   08/14/17 0500 08/15/17 1320 08/16/17 0423  Weight: 100.3 kg (221 lb 1.9 oz) 109 kg (240 lb 4.8 oz) 110 kg (242 lb 8.1 oz)    10. HTN: Monitor BID. Continue Apresoline, Avapro, Norvasc, Coreg, Catapres and Isordil.  11. Tobacco abuse: Continue nicotine patch.  12 . H/o Seizure disorder: On Depakote.  13. Neuropathy: continue gabapentin bid.  14. Anemia of chronic disease: Stable.  15. T2DM: Diet controlled at present    -Continue to monitor BS ac/hs. --- remains controlled    -Use SSI  PRN   LOS (Days) 11 A FACE TO FACE EVALUATION WAS PERFORMED  Meredith Staggers, MD 08/16/2017 8:43 AM

## 2017-08-16 NOTE — Progress Notes (Signed)
Patient discharged to home per wheelchair accompanied by NT and wife; Per patient and wife discharge instructions done yesterday. No further questions noted.

## 2017-08-17 NOTE — Discharge Summary (Signed)
Physician Discharge Summary  Patient ID: Hayden [name] MRN: [id number] DOB/AGE: 1971-03-03 47 y.o.  Admit date: 08/05/2017 Discharge date: 08/16/2017  Discharge Diagnoses:  Principal Problem:   Stroke due to embolism [facility]) Active Problems:   End stage renal disease on dialysis [facility])   Neuropathic pain   Above knee amputation of right lower extremity ([place])   Calciphylaxis   Diabetes mellitus type 2 in obese (HCC)   Anemia of chronic disease   Essential hypertension   Chronic combined systolic and diastolic CHF (congestive heart failure) (HCC)   Chronic pain syndrome   Discharged Condition: stable   Significant Diagnostic Studies: N/A   Labs:  Basic Metabolic Panel: Recent Labs  Lab 08/11/17 1620 08/13/17 1315 08/15/17 1243  NA 135 132* 136  K 4.7 4.5 4.1  CL 92* 93* 94*  CO2 24 22 27   GLUCOSE 80 99 102*  BUN 53* 55* 43*  CREATININE 8.18* 7.67* 6.58*  CALCIUM 9.7 9.4 9.9  PHOS 3.6 3.7 3.7    CBC: Recent Labs  Lab 08/11/17 1650 08/13/17 1315 08/15/17 1200  WBC 12.5* 11.1* 10.7*  HGB 11.4* 11.2* 10.1*  HCT 36.0* 36.0* 32.9*  MCV 88.7 89.8 90.4  PLT 388 343 393    CBG: Recent Labs  Lab 08/12/17 0704 08/13/17 0612 08/13/17 0632 08/14/17 0650 08/15/17 0702  GLUCAP 114* 69 89 95 81    Brief HPI:   Hayden [name] is a 47 y.o. male with history of ESRD, A flutter s/p PPM, bipolar disorder, T2DM, CVA with STM deficits, seizure disorder, chronic back pain, PVD- s/p R-AKA (with CIR admissions last year post surgery and for encephalopathy), ongoing tobacco use; who was admitted on 07/20/16 with one week history of LLE swelling with cellulitis due to injury and progressive pain with lethargy. He was started on IV Zosyn and was set for discharge on 01/22 but developed lethargy with decreased LOC. He responded briefly to Narcan but has had waxing and waning of MS with MRI brain revealing multiple small supra and infratentorial diffusion abnormalities consistent  with infarcts in multiple territory.   2D echo done revealing LVEF 35-40% with severe LVH, severe aortic stenosis and severe mitral stenosis. Neurology felt that stroke cardio-embolic from A fib and to continue coumadin.  Skin biopsy due to concerns of skin necrosis due to warfarin but this revealed vasculitis with calciphylaxis. ID consulted for input on cellulitis and recommended discontinuing antibiotics as skin changes felt to be due to calciphylaxis. Cardiology consulted for input on valvular disease as patient reported ongoing issues with chest pain X 2 weeks. He underwent cardiac cath for work up which revealed CAD with moderate pulmonary HTN, EF 55-65% and restricted aortic valve with stenosis. He was not felt to be surgical candidate due to multiple comorbidities and medical therapy recommended.  He was started on Na thiosulfate with HD on 2/1 for treatment of calciphylaxis. He continues to have issues with lethargy as well as continued reports of poor pain control. Therapy ongoing and CIR recommended due to functional deficits.    Hospital Course: Hayden [name] was admitted to rehab 08/05/2017 for inpatient therapies to consist of PT, ST and OT at least three hours five days a week. Past admission physiatrist, therapy team and rehab RN have worked together to provide customized collaborative inpatient rehab. He continued HD on MWF at end of day to help with activity tolerance. He continued to report poor pain control therefore Oxycontin was added with close  monitoring for  SE and this was titrated to 20 mg bid. He was also educated on relaxation techniques and desensitization measures.  He is tolerating this without SE and lethargy has resolved. Anemia of chronic disease was treated with iron load and H/H has been stable.  Patient requested changing coumadin to alternative medication and nephrology felt that he could be transitioned to Eliquis renal dose.  He is tolerating this without signs of  bleeding. Area on left leg was treated with telfa dressing for padding during the day.   Blood pressures were monitored on bid basis and have been reasonably controlled on Apresoline, Avapro, Norvasc, Coreg, Catapres and Isordil. Blood sugars were monitored on ac/hs basis and have been controlled on diet alone.  Weights were monitored daily with new dry weight of 98 kg.  His activity tolerance has improved and no cardiac symptoms reported with increase in activity. He has progressed to supervision at wheelchair level.  He will continue to receive follow up [place], [place] and [place] by [facility]. They have also been referred to [facility] and [place].    Rehab course: During patient's stay in rehab weekly team conferences were held to monitor patient's progress, set goals and discuss barriers to discharge. At admission, patient required min assist with mobility and mod assist with basic self care tasks. Sitting tolerance was limited by pain in sacrum as well as LLE. He  has had improvement in activity tolerance, balance, postural control as well as ability to compensate for deficits.  He is able to complete ADL tasks with set up assist and supervision. He is able to perform transfers with min assist and verbal cues/tactile cues for safety with use of RLE prosthesis. he is able to perform AP tranfers with supervision and cues for technique and safety. Family education was completed regarding all aspects of care and safety.    Disposition: 01-Home or Self Care  Diet: Renal diet. Carb modified. 1200 cc FR/day.   Special Instructions: 1. Needs assistance with mobility and ADLs.    Discharge Instructions    Ambulatory referral to Physical Medicine Rehab   Complete by:  As directed    Moderate complexity follow-up 1-2 weeks cardioembolic CVA     Allergies as of 08/16/2017      Reactions   Dilaudid [hydromorphone Hcl] Other (See Comments)   ABNORMAL BEHAVIOR, "VERBALLY AND  PHYSICALLY ABUSIVE," PSYCHOSIS   Morphine And Related Itching   Adhesive [tape] Other (See Comments)   Redness from adhesive tape if left on too long, paper tape is preferred      Medication List    STOP taking these medications   warfarin 5 MG tablet Commonly known as:  COUMADIN     TAKE these medications   amLODipine 10 MG tablet Commonly known as:  NORVASC Take 1 tablet (10 mg total) by mouth daily.   apixaban 5 MG Tabs tablet Commonly known as:  ELIQUIS Take 1 tablet (5 mg total) by mouth 2 (two) times daily.   aspirin 81 MG chewable tablet Chew 1 tablet (81 mg total) by mouth daily.   atorvastatin 80 MG tablet Commonly known as:  LIPITOR Take 1 tablet (80 mg total) by mouth daily at 6 PM.   carvedilol 25 MG tablet Commonly known as:  COREG Take 1 tablet (25 mg total) by mouth 2 (two) times daily.   cinacalcet 30 MG tablet Commonly known as:  SENSIPAR Take 3 tablets (90 mg total) by mouth daily with breakfast.  cloNIDine 0.2 mg/24hr patch Commonly known as:  CATAPRES - Dosed in mg/24 hr Place 1 patch (0.2 mg total) onto the skin once a week. What changed:  when to take this   cloNIDine 0.2 mg/24hr patch Commonly known as:  CATAPRES - Dosed in mg/24 hr Place 1 patch (0.2 mg total) onto the skin every Tuesday. Start taking on:  08/19/2017   divalproex 125 MG capsule Commonly known as:  DEPAKOTE SPRINKLE Take 4 capsules (500 mg total) by mouth every 12 (twelve) hours.   escitalopram 10 MG tablet Commonly known as:  LEXAPRO Take 1 tablet (10 mg total) by mouth daily.   ferric citrate 1 GM 210 MG(Fe) tablet Commonly known as:  AURYXIA Take 2 tablets (420 mg total) by mouth 3 (three) times daily with meals. What changed:    how much to take  when to take this  additional instructions   gabapentin 100 MG capsule Commonly known as:  NEURONTIN Take 1 capsule (100 mg total) by mouth 2 (two) times daily.   hydrALAZINE 100 MG tablet Commonly known as:   APRESOLINE Take 1 tablet (100 mg total) by mouth every 8 (eight) hours.   irbesartan 300 MG tablet Commonly known as:  AVAPRO Take 1 tablet (300 mg total) by mouth daily.   isosorbide dinitrate 10 MG tablet Commonly known as:  ISORDIL Take 1 tablet (10 mg total) by mouth 2 (two) times daily. What changed:    how much to take  how to take this  when to take this   multivitamin Tabs tablet Take 1 tablet by mouth at bedtime.   nicotine 21 mg/24hr patch Commonly known as:  NICODERM CQ - dosed in mg/24 hours 21 mg patch daily for one week then 14 mg daily for three weeks then 7 mg daily for three weeks and stop What changed:    how much to take  how to take this  when to take this  additional instructions   nitroGLYCERIN 0.4 MG SL tablet Commonly known as:  NITROSTAT Place 0.4 mg under the tongue every 5 (five) minutes as needed for chest pain.   omeprazole 40 MG capsule Commonly known as:  PRILOSEC Take 1 capsule (40 mg total) by mouth daily.   oxyCODONE 20 mg 12 hr tablet--Rx # 14 Commonly known as:  OXYCONTIN Take 1 tablet (20 mg total) by mouth every 12 (twelve) hours. What changed:  Another medication with the same name was changed. Make sure you understand how and when to take each.   Oxycodone HCl 10 MG Tabs--Rx# 30 pills Take 1 tablet (10 mg total) by mouth every 4 (four) hours as needed. What changed:  reasons to take this   pravastatin 40 MG tablet Commonly known as:  PRAVACHOL Take 1 tablet (40 mg total) by mouth every evening. What changed:  when to take this   sevelamer carbonate 800 MG tablet Commonly known as:  RENVELA Take 3 tablets (2,400 mg total) by mouth 3 (three) times daily with meals. What changed:  Another medication with the same name was removed. Continue taking this medication, and follow the directions you see here.      Follow-up Information    Meredith Staggers, MD Follow up.   Specialty:  Physical Medicine and  Rehabilitation Why:  office call for appointment Contact information: [address] [place] [address] [phone]        Sherren Mocha, MD Follow up.   Specialty:  Cardiology Why:  Call for appointment Contact information: [address] [place] [address] [phone]        Gaye Pollack, MD Follow up.   Specialty:  Cardiothoracic Surgery Why:  Call for appointment  Contact information: [place] [address] [phone]        [name], DO Follow up on 08/21/2017.   Specialty:  Family Medicine Why:  @ 2:30 pm (hospital follow up appt) Contact information: [place] [address] [phone]           Signed: Bary Leriche 08/17/2017, 12:46 PM

## 2017-08-18 ENCOUNTER — Telehealth: Payer: Self-pay | Admitting: *Deleted

## 2017-08-18 NOTE — Telephone Encounter (Signed)
Transitional Care call-I spoke with his wife Joelene Millin who called the office for appointment.    1. Are you/is patient experiencing any problems since coming home? Are there any questions regarding any aspect of care? No problems except he is unable to get his Oxycontin from [facility]. They want him to try Morphine Sulfate but he is allergic to it.  We will do a prior auth this morning to get it approved. 2. Are there any questions regarding medications administration/dosing? Are meds being taken as prescribed? Yes Patient should review meds with caller to confirm Has all meds except the Oxycontin 3. Have there been any falls? No 4. Has Home Health been to the house and/or have they contacted you? If not, have you tried to contact them? Can we help you contact them?HHRN has been out to assess his wound. They expect to hear from PT/OT today. 5. Are bowels and bladder emptying properly? Are there any unexpected incontinence issues? If applicable, is patient following bowel/bladder programs? No problems. 6. Any fevers, problems with breathing, unexpected pain? No 7. Are there any skin problems or new areas of breakdown? Has a sacral wound and HHRN is managing this care. 8. Has the patient/family member arranged specialty MD follow up (ie cardiology/neurology/renal/surgical/etc)?  Can we help arrange? Appt given to see NP then Dr Naaman Plummer. 9. Does the patient need any other services or support that we can help arrange? No 10. Are caregivers following through as expected in assisting the patient? Yes 11. Has the patient quit smoking, drinking alcohol, or using drugs as recommended? Nicotine patch  Appointment time Thursday 08/28/17 @1 :20 arrive by 1:00 to see Danella Sensing NP and Tuesday 09/23/17 @12 :20 arrive by 12:00 to see Dr Naaman Plummer. Address reviewed. [place]

## 2017-08-19 ENCOUNTER — Telehealth: Payer: Self-pay | Admitting: *Deleted

## 2017-08-19 MED ORDER — OXYCODONE ER 18 MG PO C12A: 18.0000 mg | EXTENDED_RELEASE_CAPSULE | Freq: Two times a day (BID) | ORAL | 0 refills | Status: AC

## 2017-08-19 NOTE — Telephone Encounter (Signed)
Received fax from [employer] stating that oxycontin is excluded from patient's plan. Alternatives appear to be Xtampza ER (tier 2) or Fentanyl patches (tier 1).  Patient has reactions to dilaudid and morphine sulfate. Please advise

## 2017-08-19 NOTE — Telephone Encounter (Signed)
error 

## 2017-08-19 NOTE — Telephone Encounter (Signed)
Prior authorization submitted for xtampza. Awaiting response. Notified family

## 2017-08-19 NOTE — Telephone Encounter (Signed)
Changed oxy cr to xtampza 18mg  q12 per formulary

## 2017-08-21 NOTE — Telephone Encounter (Signed)
Xtampza DENIED,  Reason (?) 'unless it is contraindicated, your patient has not exhibited an adequate response to at least 6 weeks of treatment with tricyclic antidepressants titrated to the maximum dose (drug, dose, date, and duration of trial must be provided). Perhaps we need a letter of appeal from Dr. Naaman Plummer to overturn this ruling or we need to satisfy their requirement.

## 2017-08-22 ENCOUNTER — Telehealth: Payer: Self-pay | Admitting: *Deleted

## 2017-08-22 NOTE — Telephone Encounter (Signed)
Can have fentanyl 12.mcg patch to start, #10. Let's go with that. Can RF oxycodone 10mg  Q6 prn #90. Could Zella Ball write for these?  thanks

## 2017-08-22 NOTE — Telephone Encounter (Signed)
Patients wife called asking for refill on short acting pain medication.  Also, She is asking for an update on approval for long acting medication.  Please see patients previous phone call. Ginger Organ was denied because we had not tried a six week trial of a tricyclic antidepressant titrated to the maximum dose. How this pertains to pain management, I don't know.  An appeal letter will need to be crafted and sent. Otherwise our only other long acting choice that is not morphine (allergic reaction) based is fentanyl patches.

## 2017-08-22 NOTE — Telephone Encounter (Signed)
Update: spoke with family they have since been able to secure short acting pain medication from PCP: Dr. Truman Hayward at [facility].

## 2017-08-25 ENCOUNTER — Encounter (HOSPITAL_COMMUNITY): Payer: Self-pay | Admitting: *Deleted

## 2017-08-25 ENCOUNTER — Other Ambulatory Visit: Payer: Self-pay

## 2017-08-25 DIAGNOSIS — F319 Bipolar disorder, unspecified: Secondary | ICD-10-CM | POA: Diagnosis present

## 2017-08-25 DIAGNOSIS — Z992 Dependence on renal dialysis: Secondary | ICD-10-CM

## 2017-08-25 DIAGNOSIS — Z79899 Other long term (current) drug therapy: Secondary | ICD-10-CM

## 2017-08-25 DIAGNOSIS — L89150 Pressure ulcer of sacral region, unstageable: Secondary | ICD-10-CM | POA: Diagnosis present

## 2017-08-25 DIAGNOSIS — E669 Obesity, unspecified: Secondary | ICD-10-CM | POA: Diagnosis present

## 2017-08-25 DIAGNOSIS — I5032 Chronic diastolic (congestive) heart failure: Secondary | ICD-10-CM | POA: Diagnosis present

## 2017-08-25 DIAGNOSIS — R4182 Altered mental status, unspecified: Secondary | ICD-10-CM | POA: Diagnosis not present

## 2017-08-25 DIAGNOSIS — G9341 Metabolic encephalopathy: Secondary | ICD-10-CM | POA: Diagnosis present

## 2017-08-25 DIAGNOSIS — I6340 Cerebral infarction due to embolism of unspecified cerebral artery: Principal | ICD-10-CM | POA: Diagnosis present

## 2017-08-25 DIAGNOSIS — Z7982 Long term (current) use of aspirin: Secondary | ICD-10-CM

## 2017-08-25 DIAGNOSIS — E785 Hyperlipidemia, unspecified: Secondary | ICD-10-CM | POA: Diagnosis present

## 2017-08-25 DIAGNOSIS — I255 Ischemic cardiomyopathy: Secondary | ICD-10-CM | POA: Diagnosis present

## 2017-08-25 DIAGNOSIS — N2581 Secondary hyperparathyroidism of renal origin: Secondary | ICD-10-CM | POA: Diagnosis present

## 2017-08-25 DIAGNOSIS — Z950 Presence of cardiac pacemaker: Secondary | ICD-10-CM

## 2017-08-25 DIAGNOSIS — Z66 Do not resuscitate: Secondary | ICD-10-CM | POA: Diagnosis present

## 2017-08-25 DIAGNOSIS — Z85528 Personal history of other malignant neoplasm of kidney: Secondary | ICD-10-CM

## 2017-08-25 DIAGNOSIS — I2510 Atherosclerotic heart disease of native coronary artery without angina pectoris: Secondary | ICD-10-CM | POA: Diagnosis present

## 2017-08-25 DIAGNOSIS — F17210 Nicotine dependence, cigarettes, uncomplicated: Secondary | ICD-10-CM | POA: Diagnosis present

## 2017-08-25 DIAGNOSIS — Z9049 Acquired absence of other specified parts of digestive tract: Secondary | ICD-10-CM

## 2017-08-25 DIAGNOSIS — Z905 Acquired absence of kidney: Secondary | ICD-10-CM

## 2017-08-25 DIAGNOSIS — Z7901 Long term (current) use of anticoagulants: Secondary | ICD-10-CM

## 2017-08-25 DIAGNOSIS — G894 Chronic pain syndrome: Secondary | ICD-10-CM | POA: Diagnosis present

## 2017-08-25 DIAGNOSIS — D631 Anemia in chronic kidney disease: Secondary | ICD-10-CM | POA: Diagnosis present

## 2017-08-25 DIAGNOSIS — Z6830 Body mass index (BMI) 30.0-30.9, adult: Secondary | ICD-10-CM

## 2017-08-25 DIAGNOSIS — Z89511 Acquired absence of right leg below knee: Secondary | ICD-10-CM

## 2017-08-25 DIAGNOSIS — E1122 Type 2 diabetes mellitus with diabetic chronic kidney disease: Secondary | ICD-10-CM | POA: Diagnosis present

## 2017-08-25 DIAGNOSIS — I080 Rheumatic disorders of both mitral and aortic valves: Secondary | ICD-10-CM | POA: Diagnosis present

## 2017-08-25 DIAGNOSIS — Z8673 Personal history of transient ischemic attack (TIA), and cerebral infarction without residual deficits: Secondary | ICD-10-CM

## 2017-08-25 DIAGNOSIS — I4892 Unspecified atrial flutter: Secondary | ICD-10-CM | POA: Diagnosis present

## 2017-08-25 DIAGNOSIS — N186 End stage renal disease: Secondary | ICD-10-CM | POA: Diagnosis present

## 2017-08-25 DIAGNOSIS — I482 Chronic atrial fibrillation: Secondary | ICD-10-CM | POA: Diagnosis present

## 2017-08-25 DIAGNOSIS — I132 Hypertensive heart and chronic kidney disease with heart failure and with stage 5 chronic kidney disease, or end stage renal disease: Secondary | ICD-10-CM | POA: Diagnosis present

## 2017-08-25 DIAGNOSIS — Z89611 Acquired absence of right leg above knee: Secondary | ICD-10-CM

## 2017-08-25 LAB — COMPREHENSIVE METABOLIC PANEL
ALBUMIN: 2.8 g/dL — AB (ref 3.5–5.0)
ALT: 32 U/L (ref 17–63)
ANION GAP: 14 (ref 5–15)
AST: 38 U/L (ref 15–41)
Alkaline Phosphatase: 389 U/L — ABNORMAL HIGH (ref 38–126)
BILIRUBIN TOTAL: 0.9 mg/dL (ref 0.3–1.2)
BUN: 23 mg/dL — ABNORMAL HIGH (ref 6–20)
CO2: 28 mmol/L (ref 22–32)
Calcium: 8.9 mg/dL (ref 8.9–10.3)
Chloride: 95 mmol/L — ABNORMAL LOW (ref 101–111)
Creatinine, Ser: 4.69 mg/dL — ABNORMAL HIGH (ref 0.61–1.24)
GFR calc non Af Amer: 14 mL/min — ABNORMAL LOW (ref >60)
GFR, EST AFRICAN AMERICAN: 16 mL/min — AB (ref >60)
GLUCOSE: 80 mg/dL (ref 65–99)
Potassium: 3.7 mmol/L (ref 3.5–5.1)
Sodium: 137 mmol/L (ref 135–145)
TOTAL PROTEIN: 7.1 g/dL (ref 6.5–8.1)

## 2017-08-25 LAB — CBC
HEMATOCRIT: 38.5 % — AB (ref 39.0–52.0)
HEMOGLOBIN: 12.1 g/dL — AB (ref 13.0–17.0)
MCH: 28.2 pg (ref 26.0–34.0)
MCHC: 31.4 g/dL (ref 30.0–36.0)
MCV: 89.7 fL (ref 78.0–100.0)
Platelets: 329 10*3/uL (ref 150–400)
RBC: 4.29 MIL/uL (ref 4.22–5.81)
RDW: 19.1 % — ABNORMAL HIGH (ref 11.5–15.5)
WBC: 11.8 10*3/uL — ABNORMAL HIGH (ref 4.0–10.5)

## 2017-08-25 LAB — I-STAT CG4 LACTIC ACID, ED: LACTIC ACID, VENOUS: 1.05 mmol/L (ref 0.5–1.9)

## 2017-08-25 NOTE — ED Triage Notes (Signed)
Dialysis pt, last treatment was today. Per family, pt has been altered since Saturday. Pt answering some questions at triage. Would not state his name but states that his leg hurts. Pt appears uncomfortable at triage. Had been admitted recently for cellulitis to his left leg. Having tremors/jerking of his arms for several days.

## 2017-08-25 NOTE — ED Notes (Signed)
Pt's family stated, this started on Saturday. He finished is dialysis today.

## 2017-08-26 ENCOUNTER — Inpatient Hospital Stay (HOSPITAL_COMMUNITY)
Admission: EM | Admit: 2017-08-26 | Discharge: 2017-08-29 | DRG: 064 | Disposition: A | Discharge status: Hospice | Payer: Medicare Other | Attending: Family Medicine | Admitting: Family Medicine

## 2017-08-26 ENCOUNTER — Encounter (HOSPITAL_COMMUNITY): Payer: Self-pay | Admitting: Family Medicine

## 2017-08-26 ENCOUNTER — Emergency Department (HOSPITAL_COMMUNITY): Payer: Medicare Other

## 2017-08-26 DIAGNOSIS — R748 Abnormal levels of other serum enzymes: Secondary | ICD-10-CM

## 2017-08-26 DIAGNOSIS — I4892 Unspecified atrial flutter: Secondary | ICD-10-CM

## 2017-08-26 DIAGNOSIS — Z8673 Personal history of transient ischemic attack (TIA), and cerebral infarction without residual deficits: Secondary | ICD-10-CM

## 2017-08-26 DIAGNOSIS — I442 Atrioventricular block, complete: Secondary | ICD-10-CM | POA: Diagnosis not present

## 2017-08-26 DIAGNOSIS — Z992 Dependence on renal dialysis: Secondary | ICD-10-CM | POA: Diagnosis not present

## 2017-08-26 DIAGNOSIS — N186 End stage renal disease: Secondary | ICD-10-CM | POA: Diagnosis not present

## 2017-08-26 DIAGNOSIS — L8990 Pressure ulcer of unspecified site, unspecified stage: Secondary | ICD-10-CM

## 2017-08-26 DIAGNOSIS — G9340 Encephalopathy, unspecified: Secondary | ICD-10-CM | POA: Diagnosis not present

## 2017-08-26 DIAGNOSIS — G894 Chronic pain syndrome: Secondary | ICD-10-CM | POA: Diagnosis present

## 2017-08-26 DIAGNOSIS — R7989 Other specified abnormal findings of blood chemistry: Secondary | ICD-10-CM

## 2017-08-26 DIAGNOSIS — R4182 Altered mental status, unspecified: Secondary | ICD-10-CM

## 2017-08-26 DIAGNOSIS — R778 Other specified abnormalities of plasma proteins: Secondary | ICD-10-CM | POA: Diagnosis present

## 2017-08-26 LAB — I-STAT CG4 LACTIC ACID, ED: Lactic Acid, Venous: 0.78 mmol/L (ref 0.5–1.9)

## 2017-08-26 LAB — TROPONIN I
TROPONIN I: 0.08 ng/mL — AB (ref <0.03)
TROPONIN I: 0.09 ng/mL — AB (ref <0.03)
TROPONIN I: 0.1 ng/mL — AB (ref <0.03)

## 2017-08-26 LAB — CBG MONITORING, ED: Glucose-Capillary: 85 mg/dL (ref 65–99)

## 2017-08-26 LAB — TSH: TSH: 6.241 u[IU]/mL — ABNORMAL HIGH (ref 0.350–4.500)

## 2017-08-26 LAB — VITAMIN B12: VITAMIN B 12: 1203 pg/mL — AB (ref 180–914)

## 2017-08-26 LAB — AMMONIA: AMMONIA: 33 umol/L (ref 9–35)

## 2017-08-26 LAB — RPR: RPR: NONREACTIVE

## 2017-08-26 LAB — HIV ANTIBODY (ROUTINE TESTING W REFLEX): HIV Screen 4th Generation wRfx: NONREACTIVE

## 2017-08-26 MED ORDER — APIXABAN 5 MG PO TABS
5.0000 mg | ORAL_TABLET | Freq: Two times a day (BID) | ORAL | Status: DC
  Administered 2017-08-26 – 2017-08-29 (×7): 5 mg via ORAL
  Filled 2017-08-26 (×8): qty 1

## 2017-08-26 MED ORDER — ESCITALOPRAM OXALATE 10 MG PO TABS
10.0000 mg | ORAL_TABLET | Freq: Every day | ORAL | Status: DC
  Administered 2017-08-26 – 2017-08-29 (×4): 10 mg via ORAL
  Filled 2017-08-26 (×4): qty 1

## 2017-08-26 MED ORDER — SEVELAMER CARBONATE 800 MG PO TABS
2400.0000 mg | ORAL_TABLET | Freq: Three times a day (TID) | ORAL | Status: DC
  Administered 2017-08-26 – 2017-08-29 (×8): 2400 mg via ORAL
  Filled 2017-08-26 (×12): qty 3

## 2017-08-26 MED ORDER — CLONIDINE HCL 0.2 MG/24HR TD PTWK
0.2000 mg | MEDICATED_PATCH | TRANSDERMAL | Status: DC
  Administered 2017-08-26: 0.2 mg via TRANSDERMAL
  Filled 2017-08-26: qty 1

## 2017-08-26 MED ORDER — OXYCODONE HCL ER 10 MG PO T12A
10.0000 mg | EXTENDED_RELEASE_TABLET | Freq: Two times a day (BID) | ORAL | Status: DC
  Administered 2017-08-26 – 2017-08-29 (×7): 10 mg via ORAL
  Filled 2017-08-26 (×7): qty 1

## 2017-08-26 MED ORDER — ISOSORBIDE DINITRATE 10 MG PO TABS
10.0000 mg | ORAL_TABLET | Freq: Two times a day (BID) | ORAL | Status: DC
  Administered 2017-08-26 – 2017-08-29 (×6): 10 mg via ORAL
  Filled 2017-08-26 (×8): qty 1

## 2017-08-26 MED ORDER — ASPIRIN 81 MG PO CHEW
81.0000 mg | CHEWABLE_TABLET | Freq: Every day | ORAL | Status: DC
  Administered 2017-08-26 – 2017-08-29 (×4): 81 mg via ORAL
  Filled 2017-08-26 (×4): qty 1

## 2017-08-26 MED ORDER — BISACODYL 5 MG PO TBEC: 5.0000 mg | DELAYED_RELEASE_TABLET | Freq: Every day | ORAL | Status: DC | PRN

## 2017-08-26 MED ORDER — SODIUM CHLORIDE 0.9% FLUSH
3.0000 mL | Freq: Two times a day (BID) | INTRAVENOUS | Status: DC
  Administered 2017-08-26 – 2017-08-28 (×5): 3 mL via INTRAVENOUS

## 2017-08-26 MED ORDER — ACETAMINOPHEN 325 MG PO TABS
650.0000 mg | ORAL_TABLET | Freq: Four times a day (QID) | ORAL | Status: DC | PRN
  Administered 2017-08-26: 650 mg via ORAL
  Filled 2017-08-26: qty 2

## 2017-08-26 MED ORDER — ATORVASTATIN CALCIUM 80 MG PO TABS
80.0000 mg | ORAL_TABLET | Freq: Every day | ORAL | Status: DC
  Administered 2017-08-26 – 2017-08-28 (×2): 80 mg via ORAL
  Filled 2017-08-26 (×3): qty 1

## 2017-08-26 MED ORDER — CARVEDILOL 25 MG PO TABS
25.0000 mg | ORAL_TABLET | Freq: Two times a day (BID) | ORAL | Status: DC
  Administered 2017-08-26 – 2017-08-29 (×6): 25 mg via ORAL
  Filled 2017-08-26 (×3): qty 1
  Filled 2017-08-26: qty 2
  Filled 2017-08-26 (×2): qty 1

## 2017-08-26 MED ORDER — PRAVASTATIN SODIUM 40 MG PO TABS: 40.0000 mg | ORAL_TABLET | Freq: Every evening | ORAL | Status: DC

## 2017-08-26 MED ORDER — GABAPENTIN 100 MG PO CAPS
100.0000 mg | ORAL_CAPSULE | Freq: Two times a day (BID) | ORAL | Status: DC
  Administered 2017-08-26 – 2017-08-29 (×7): 100 mg via ORAL
  Filled 2017-08-26 (×7): qty 1

## 2017-08-26 MED ORDER — SODIUM CHLORIDE 0.9% FLUSH: 3.0000 mL | INTRAVENOUS | Status: DC | PRN

## 2017-08-26 MED ORDER — TRAMADOL HCL 50 MG PO TABS
50.0000 mg | ORAL_TABLET | Freq: Four times a day (QID) | ORAL | Status: DC | PRN
  Administered 2017-08-29: 50 mg via ORAL
  Filled 2017-08-26: qty 1

## 2017-08-26 MED ORDER — SENNOSIDES-DOCUSATE SODIUM 8.6-50 MG PO TABS: 1.0000 | ORAL_TABLET | Freq: Every evening | ORAL | Status: DC | PRN

## 2017-08-26 MED ORDER — IRBESARTAN 300 MG PO TABS
300.0000 mg | ORAL_TABLET | Freq: Every day | ORAL | Status: DC
  Administered 2017-08-26 – 2017-08-29 (×3): 300 mg via ORAL
  Filled 2017-08-26 (×4): qty 1

## 2017-08-26 MED ORDER — PANTOPRAZOLE SODIUM 40 MG PO TBEC
40.0000 mg | DELAYED_RELEASE_TABLET | Freq: Every day | ORAL | Status: DC
  Administered 2017-08-26 – 2017-08-29 (×4): 40 mg via ORAL
  Filled 2017-08-26 (×4): qty 1

## 2017-08-26 MED ORDER — ONDANSETRON HCL 4 MG PO TABS: 4.0000 mg | ORAL_TABLET | Freq: Four times a day (QID) | ORAL | Status: DC | PRN

## 2017-08-26 MED ORDER — SODIUM CHLORIDE 0.9 % IV SOLN: 250.0000 mL | INTRAVENOUS | Status: DC | PRN

## 2017-08-26 MED ORDER — RENA-VITE PO TABS
1.0000 | ORAL_TABLET | Freq: Every day | ORAL | Status: DC
  Administered 2017-08-26 – 2017-08-28 (×3): 1 via ORAL
  Filled 2017-08-26 (×4): qty 1

## 2017-08-26 MED ORDER — AMLODIPINE BESYLATE 10 MG PO TABS
10.0000 mg | ORAL_TABLET | Freq: Every day | ORAL | Status: DC
  Administered 2017-08-26 – 2017-08-29 (×3): 10 mg via ORAL
  Filled 2017-08-26: qty 2
  Filled 2017-08-26 (×3): qty 1

## 2017-08-26 MED ORDER — ONDANSETRON HCL 4 MG/2ML IJ SOLN: 4.0000 mg | Freq: Four times a day (QID) | INTRAMUSCULAR | Status: DC | PRN

## 2017-08-26 MED ORDER — HYDRALAZINE HCL 25 MG PO TABS
100.0000 mg | ORAL_TABLET | Freq: Three times a day (TID) | ORAL | Status: DC
  Administered 2017-08-26 – 2017-08-29 (×7): 100 mg via ORAL
  Filled 2017-08-26: qty 4
  Filled 2017-08-26: qty 10
  Filled 2017-08-26 (×3): qty 4
  Filled 2017-08-26: qty 10
  Filled 2017-08-26 (×3): qty 4

## 2017-08-26 MED ORDER — NICOTINE 14 MG/24HR TD PT24
14.0000 mg | MEDICATED_PATCH | Freq: Every day | TRANSDERMAL | Status: DC
  Administered 2017-08-26 – 2017-08-29 (×4): 14 mg via TRANSDERMAL
  Filled 2017-08-26 (×4): qty 1

## 2017-08-26 MED ORDER — OXYCODONE HCL 5 MG PO TABS
5.0000 mg | ORAL_TABLET | ORAL | Status: DC | PRN
  Administered 2017-08-26 – 2017-08-29 (×8): 5 mg via ORAL
  Filled 2017-08-26 (×8): qty 1

## 2017-08-26 MED ORDER — DIVALPROEX SODIUM 125 MG PO CSDR
500.0000 mg | DELAYED_RELEASE_CAPSULE | Freq: Two times a day (BID) | ORAL | Status: DC
  Administered 2017-08-26 – 2017-08-29 (×7): 500 mg via ORAL
  Filled 2017-08-26 (×8): qty 4

## 2017-08-26 MED ORDER — ACETAMINOPHEN 650 MG RE SUPP: 650.0000 mg | Freq: Four times a day (QID) | RECTAL | Status: DC | PRN

## 2017-08-26 MED ORDER — CINACALCET HCL 30 MG PO TABS
90.0000 mg | ORAL_TABLET | Freq: Every day | ORAL | Status: DC
  Administered 2017-08-26 – 2017-08-29 (×4): 90 mg via ORAL
  Filled 2017-08-26 (×5): qty 3

## 2017-08-26 MED ORDER — FERRIC CITRATE 1 GM 210 MG(FE) PO TABS
420.0000 mg | ORAL_TABLET | Freq: Three times a day (TID) | ORAL | Status: DC
  Administered 2017-08-26 – 2017-08-29 (×7): 420 mg via ORAL
  Filled 2017-08-26 (×16): qty 2

## 2017-08-26 NOTE — ED Notes (Signed)
3E RN in pt care, stated she will return call in 5 mins.
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
## 2017-08-26 NOTE — Progress Notes (Signed)
Jorge Retz Castronovo is a 47 y.o. 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  Patient was recently diagnosed with calciphylaxis, has been treated with oxycodone at home, and was noted by family to develop confusion approximately 3 days ago.  He has been complaining of severe pain in the lower left leg, but no other complaints. Admitted for acute metabolic encephalopathy.  08/26/17: CT head: 1. No acute intracranial pathology by CT. 2. Mild mucoperiosteal thickening of the right maxillary sinus with a probable right maxillary retention cyst or polyp.  Troponin 0.10. Denies chest pain. Cycle. EKG ventricular paced rhythm.  Seen and examined with family members at his beside in the ED. No new complaints.  Please refer to H&P dictated by Dr. Myna Hidalgo on 08/26/17 for further assessment and evaluation.

## 2017-08-26 NOTE — ED Notes (Signed)
Requested to I/O cath patient; wife states that patient just voided yesterday and was hesitant about allowing the cath. md aware.

## 2017-08-26 NOTE — ED Notes (Signed)
Phoned pharmacy about dose insufficiency of Hydralazine in pyxis.

## 2017-08-26 NOTE — ED Notes (Signed)
Attempted report x1. 

## 2017-08-26 NOTE — ED Notes (Signed)
Patient continuously removes his cardiac leads due to being uncomfortable.

## 2017-08-26 NOTE — ED Notes (Signed)
Pt picking at lips continuously when awake. Dried Blood noted when this RN was rounding. Bleeding had stopped, dried blood cleaned from pt face.
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
## 2017-08-27 ENCOUNTER — Observation Stay (HOSPITAL_COMMUNITY): Payer: Medicare Other

## 2017-08-27 DIAGNOSIS — I080 Rheumatic disorders of both mitral and aortic valves: Secondary | ICD-10-CM | POA: Diagnosis present

## 2017-08-27 DIAGNOSIS — N186 End stage renal disease: Secondary | ICD-10-CM | POA: Diagnosis present

## 2017-08-27 DIAGNOSIS — I132 Hypertensive heart and chronic kidney disease with heart failure and with stage 5 chronic kidney disease, or end stage renal disease: Secondary | ICD-10-CM | POA: Diagnosis present

## 2017-08-27 DIAGNOSIS — Z992 Dependence on renal dialysis: Secondary | ICD-10-CM | POA: Diagnosis not present

## 2017-08-27 DIAGNOSIS — L8990 Pressure ulcer of unspecified site, unspecified stage: Secondary | ICD-10-CM

## 2017-08-27 DIAGNOSIS — E1122 Type 2 diabetes mellitus with diabetic chronic kidney disease: Secondary | ICD-10-CM | POA: Diagnosis present

## 2017-08-27 DIAGNOSIS — E669 Obesity, unspecified: Secondary | ICD-10-CM | POA: Diagnosis present

## 2017-08-27 DIAGNOSIS — G894 Chronic pain syndrome: Secondary | ICD-10-CM | POA: Diagnosis present

## 2017-08-27 DIAGNOSIS — R4182 Altered mental status, unspecified: Secondary | ICD-10-CM | POA: Diagnosis present

## 2017-08-27 DIAGNOSIS — G9340 Encephalopathy, unspecified: Secondary | ICD-10-CM | POA: Diagnosis not present

## 2017-08-27 DIAGNOSIS — Z950 Presence of cardiac pacemaker: Secondary | ICD-10-CM | POA: Diagnosis not present

## 2017-08-27 DIAGNOSIS — Z89511 Acquired absence of right leg below knee: Secondary | ICD-10-CM | POA: Diagnosis not present

## 2017-08-27 DIAGNOSIS — I4892 Unspecified atrial flutter: Secondary | ICD-10-CM | POA: Diagnosis present

## 2017-08-27 DIAGNOSIS — I6340 Cerebral infarction due to embolism of unspecified cerebral artery: Secondary | ICD-10-CM | POA: Diagnosis present

## 2017-08-27 DIAGNOSIS — Z89611 Acquired absence of right leg above knee: Secondary | ICD-10-CM | POA: Diagnosis not present

## 2017-08-27 DIAGNOSIS — Z85528 Personal history of other malignant neoplasm of kidney: Secondary | ICD-10-CM | POA: Diagnosis not present

## 2017-08-27 DIAGNOSIS — I442 Atrioventricular block, complete: Secondary | ICD-10-CM | POA: Diagnosis not present

## 2017-08-27 DIAGNOSIS — Z66 Do not resuscitate: Secondary | ICD-10-CM | POA: Diagnosis present

## 2017-08-27 DIAGNOSIS — F319 Bipolar disorder, unspecified: Secondary | ICD-10-CM | POA: Diagnosis present

## 2017-08-27 DIAGNOSIS — L89150 Pressure ulcer of sacral region, unstageable: Secondary | ICD-10-CM | POA: Diagnosis present

## 2017-08-27 DIAGNOSIS — I361 Nonrheumatic tricuspid (valve) insufficiency: Secondary | ICD-10-CM | POA: Diagnosis not present

## 2017-08-27 DIAGNOSIS — G9341 Metabolic encephalopathy: Secondary | ICD-10-CM | POA: Diagnosis present

## 2017-08-27 DIAGNOSIS — N2581 Secondary hyperparathyroidism of renal origin: Secondary | ICD-10-CM | POA: Diagnosis present

## 2017-08-27 DIAGNOSIS — Z6830 Body mass index (BMI) 30.0-30.9, adult: Secondary | ICD-10-CM | POA: Diagnosis not present

## 2017-08-27 DIAGNOSIS — I5032 Chronic diastolic (congestive) heart failure: Secondary | ICD-10-CM | POA: Diagnosis present

## 2017-08-27 DIAGNOSIS — E785 Hyperlipidemia, unspecified: Secondary | ICD-10-CM | POA: Diagnosis present

## 2017-08-27 DIAGNOSIS — R748 Abnormal levels of other serum enzymes: Secondary | ICD-10-CM | POA: Diagnosis not present

## 2017-08-27 DIAGNOSIS — I2510 Atherosclerotic heart disease of native coronary artery without angina pectoris: Secondary | ICD-10-CM | POA: Diagnosis present

## 2017-08-27 DIAGNOSIS — Z8673 Personal history of transient ischemic attack (TIA), and cerebral infarction without residual deficits: Secondary | ICD-10-CM | POA: Diagnosis not present

## 2017-08-27 LAB — CBC
HCT: 37.5 % — ABNORMAL LOW (ref 39.0–52.0)
HEMOGLOBIN: 11.6 g/dL — AB (ref 13.0–17.0)
MCH: 27.9 pg (ref 26.0–34.0)
MCHC: 30.9 g/dL (ref 30.0–36.0)
MCV: 90.1 fL (ref 78.0–100.0)
PLATELETS: 290 10*3/uL (ref 150–400)
RBC: 4.16 MIL/uL — AB (ref 4.22–5.81)
RDW: 19.3 % — ABNORMAL HIGH (ref 11.5–15.5)
WBC: 9.4 10*3/uL (ref 4.0–10.5)

## 2017-08-27 LAB — RENAL FUNCTION PANEL
ANION GAP: 13 (ref 5–15)
Albumin: 2.6 g/dL — ABNORMAL LOW (ref 3.5–5.0)
BUN: 30 mg/dL — ABNORMAL HIGH (ref 6–20)
CALCIUM: 9 mg/dL (ref 8.9–10.3)
CHLORIDE: 97 mmol/L — AB (ref 101–111)
CO2: 26 mmol/L (ref 22–32)
CREATININE: 5.81 mg/dL — AB (ref 0.61–1.24)
GFR, EST AFRICAN AMERICAN: 12 mL/min — AB (ref >60)
GFR, EST NON AFRICAN AMERICAN: 11 mL/min — AB (ref >60)
Glucose, Bld: 96 mg/dL (ref 65–99)
Phosphorus: 3.1 mg/dL (ref 2.5–4.6)
Potassium: 3.8 mmol/L (ref 3.5–5.1)
SODIUM: 136 mmol/L (ref 135–145)

## 2017-08-27 LAB — TROPONIN I
TROPONIN I: 0.07 ng/mL — AB (ref <0.03)
Troponin I: 0.07 ng/mL (ref <0.03)

## 2017-08-27 LAB — FOLATE RBC
Folate, RBC: >1667 ng/mL (ref >498)
Hematocrit: 37.2 % — ABNORMAL LOW (ref 37.5–51.0)

## 2017-08-27 LAB — MRSA PCR SCREENING: MRSA by PCR: NEGATIVE

## 2017-08-27 MED ORDER — COLLAGENASE 250 UNIT/GM EX OINT
TOPICAL_OINTMENT | Freq: Every day | CUTANEOUS | Status: DC
  Administered 2017-08-27 – 2017-08-29 (×3): via TOPICAL
  Filled 2017-08-27: qty 30

## 2017-08-27 MED ORDER — SODIUM THIOSULFATE 25 % IV SOLN: 25.0000 g | INTRAVENOUS | Status: DC

## 2017-08-27 MED ORDER — ORAL CARE MOUTH RINSE
15.0000 mL | Freq: Two times a day (BID) | OROMUCOSAL | Status: DC
  Administered 2017-08-27 – 2017-08-28 (×3): 15 mL via OROMUCOSAL
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
## 2017-08-27 NOTE — Progress Notes (Signed)
Patient gone to dialysis

## 2017-08-27 NOTE — Progress Notes (Signed)
HD tx completed @ 2122 w/o problem, UF goal met, blood rinsed back, VSS, will cont to monitor, attempted to call report but prim RN in contact room, will try again in a bit

## 2017-08-27 NOTE — Consult Note (Signed)
[facility] Nurse wound consult note Reason for Consult: sacrum, LE  Wound type: LE calciphylaxis, confirmed diagnosis Unstageable pressure injury: left inner gluteal fold  Pressure Injury POA: Yes Measurement: LLE; medial 12cm x 13cm x 0cm ; lateral 9cm x 6cm x 0cm  Buttock; 3cm x 1.0cm x 0.1cm Wound bed: LLE wound; full thickness ulceration 100% stable eschar Buttock; 100% yellow Drainage (amount, consistency, odor) minimal from each site, no odor Periwound: noted erythema that extends circumferentially at the wound site, LLE. Marked today with marker.  Dressing procedure/placement/frequency: Enzymatic debridement to the buttock wound, cover with saline moist gauze. Change daily. LALM in place for moisture management and pressure redistribution No topical care for the LLE, leave stable and intact. Should the patient become symptomatic from the wounds and the wounds open.   Discussed POC with patient and bedside nurse.  Re consult if needed, will not follow at this time. Thanks  Ieisha Gao [employer], RN,CWOCN, CNS, [place] [phone])

## 2017-08-27 NOTE — Progress Notes (Signed)
Requested WOC consult due to pressure injury located on sacrum and for recommendations how to treat calciphylaxic on left lower leg since there is order for wound care.  Low air mattress ordered per family request.  Will continue to monitor.  Martrice Apt, RN

## 2017-08-27 NOTE — Progress Notes (Signed)
Pt has left the floor for MRI, will be gone for approximately one hour. Call received from dialysis as pt was leaving, states they will call back for him.

## 2017-08-27 NOTE — Progress Notes (Signed)
PROGRESS NOTE    Hayden [name]  VOZ:[id number] DOB: 03-17-1971 DOA: 08/26/2017 PCP: [name], DO      Brief Narrative:  Hayden [name] is a 47 y.o. 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     Assessment & Plan:    Acute encephalopathy  Ammonia normal, TSH, B12, folate, RPR normal.  CT head normal.  Etiologies include worsening/new embolic strokes and/or septic emboli as well as opiate overdose or pain. -Check MR brain Delirium precautions:   -Lights and TV off, minimize interruptions at night  -Blinds open and lights on during day  -Glasses/hearing aid with patient  -Frequent reorientation  -PT/OT when able  -Avoid sedation medications/Beers list medications  ESRD  -Consult Nephrology for maintenance HD  Hx of CVA  -Continue aspirin, statin  Calciphylaxis  -Continue pain control with oxycodone  Atrial flutter  Complete heart block CHADS-VASc at least 4 (CVA x2, CAD, HTN).  Has pacer.  -Continue Eliquis  CAD; elevated troponin  No angina.  Troponin flat, suspect poor clearance from ESRD.   -Continue ASA, statin, beta-blocker, ARB, and nitrates     DVT prophylaxis: Eliquis  Code Status: DNR  Family Communication: Significant other and mother updated at bedside Disposition Plan: Observe on telemetry Consults called: None    Subjective: Still somewhat out of it, but more alert today.  Pain improved.  No new redness or fever, or swelling around the leg.  No vomiting, cough, abdominal pain.  Objective: Vitals:   08/27/17 1722 08/27/17 1730 08/27/17 1800 08/27/17 1830  BP: (!) 152/84 (!) 183/88 (!) 184/83 (!) 173/78  Pulse: 65 67 67 62  Resp: 19 (!) 21 15 13   Temp:      TempSrc:      SpO2:    95%  Weight:      Height:         Intake/Output Summary (Last 24 hours) at 08/27/2017 1855 Last data filed at 08/27/2017 1603 Gross per 24 hour  Intake 240 ml  Output 0 ml  Net 240 ml   Filed Weights   08/25/17 1829 08/26/17 2209 08/27/17 0430  Weight: 96.2 kg (212 lb 1.3 oz) 92.8 kg (204 lb 8 oz) 92.1 kg (203 lb)    Examination: General appearance: Chronically ill appearing adult male, alert and in noa cute distress.   HEENT: Anicteric, conjunctiva pink, lids and lashes normal. No nasal deformity, discharge, epistaxis.  Lips dry.   Skin: Warm and dry.  There is large black ulcers of the left lower leg. Cardiac: RRR, nl Q2-V9, loud systolic murmur. Respiratory: Normal respiratory rate and rhythm.  CTAB without rales or wheezes. Abdomen: Abdomen soft.  No TTP. MSK: No deformities or effusions.  Right AKA. Neuro: Awake but sleepy.  Slightly confused.  Cranial nerves normal.  Strength in upper extremities equal, in LLE normal.  EOMI, moves all extremities. Speech fluent.    Psych: Sensorium intact and responding to questions, attention diminished. Affect blunted.  Judgment and insight appear poor.    Data Reviewed: I have personally reviewed following labs and imaging studies:  CBC: Recent Labs  Lab 08/25/17 1845 08/26/17 0445 08/27/17 1811  WBC 11.8*  --  9.4  HGB 12.1*  --  11.6*  HCT 38.5* 37.2* 37.5*  MCV 89.7  --  90.1  PLT 329  --  619   Basic Metabolic Panel: Recent Labs  Lab 08/25/17 1845 08/27/17 1809  NA 137 136  K 3.7 3.8  CL 95* 97*  CO2 28 26  GLUCOSE 80 96  BUN 23* 30*  CREATININE 4.69* 5.81*  CALCIUM 8.9 9.0  PHOS  --  3.1   GFR: Estimated Creatinine Clearance: 18 mL/min (A) (by C-G formula based on SCr of 5.81 mg/dL (H)). Liver Function Tests: Recent Labs  Lab 08/25/17 1845 08/27/17 1809  AST 38  --   ALT 32  --   ALKPHOS 389*  --   BILITOT 0.9  --   PROT 7.1  --   ALBUMIN 2.8* 2.6*   No results for input(s): LIPASE, AMYLASE in the last 168 hours. Recent Labs    Lab 08/26/17 0140  AMMONIA 33   Coagulation Profile: No results for input(s): INR, PROTIME in the last 168 hours. Cardiac Enzymes: Recent Labs  Lab 08/26/17 0140 08/26/17 0445 08/26/17 1122 08/27/17 0022 08/27/17 0651  TROPONINI 0.08* 0.09* 0.10* 0.07* 0.07*   BNP (last 3 results) No results for input(s): PROBNP in the last 8760 hours. HbA1C: No results for input(s): HGBA1C in the last 72 hours. CBG: Recent Labs  Lab 08/26/17 0529  GLUCAP 85   Lipid Profile: No results for input(s): CHOL, HDL, LDLCALC, TRIG, CHOLHDL, LDLDIRECT in the last 72 hours. Thyroid Function Tests: Recent Labs    08/26/17 0445  TSH 6.241*   Anemia Panel: Recent Labs    08/26/17 0445  VITAMINB12 1,203*   Urine analysis:    Component Value Date/Time   COLORURINE AMBER (A) 11/04/2015 1930   APPEARANCEUR TURBID (A) 11/04/2015 1930   LABSPEC 1.019 11/04/2015 1930   PHURINE 5.5 11/04/2015 1930   GLUCOSEU 100 (A) 11/04/2015 1930   HGBUR NEGATIVE 11/04/2015 1930   BILIRUBINUR NEGATIVE 11/04/2015 1930   KETONESUR NEGATIVE 11/04/2015 1930   PROTEINUR >300 (A) 11/04/2015 1930   UROBILINOGEN 0.2 09/22/2012 1805   NITRITE NEGATIVE 11/04/2015 1930   LEUKOCYTESUR NEGATIVE 11/04/2015 1930   Sepsis Labs: @LABRCNTIP (procalcitonin:4,lacticacidven:4)  ) Recent Results (from the past 240 hour(s))  MRSA PCR Screening     Status: None   Collection Time: 08/26/17 11:11 PM  Result Value Ref Range Status   MRSA by PCR NEGATIVE NEGATIVE Final    Comment:        The GeneXpert MRSA Assay (FDA approved for NASAL specimens only), is one component of a comprehensive MRSA colonization surveillance program. It is not intended to diagnose MRSA infection nor to guide or monitor treatment for MRSA infections. Performed at [facility], [place] [address], [place], [place] [address]          Radiology Studies: Ct Head Wo Contrast  Result Date: 08/26/2017 CLINICAL DATA:  47 year old male with  altered mental status. EXAM: CT HEAD WITHOUT CONTRAST TECHNIQUE: Contiguous axial images were obtained from the base of the skull through the vertex without intravenous contrast. COMPARISON:  None. FINDINGS: Brain: The ventricles and sulci appropriate size for patient's age. The gray-white matter discrimination is preserved. There is no acute intracranial hemorrhage. No mass effect or midline shift. No extra-axial fluid collection. Vascular: No hyperdense vessel or unexpected calcification. Skull: Normal. Negative for fracture or focal lesion. Sinuses/Orbits: A 2 cm partially visualized soft tissue lesion in the right maxillary sinus incompletely characterized, likely a retention cyst or polyp. There is mucoperiosteal thickening of the right maxillary sinus. No air-fluid level. The mastoid air cells are clear. Other: None IMPRESSION: 1.  No acute intracranial pathology by CT. 2. Mild mucoperiosteal thickening of the right maxillary sinus with a probable right maxillary retention cyst or polyp. Electronically Signed   By: Anner Crete M.D.   On: 08/26/2017 02:20   Mr Brain Wo Contrast  Result Date: 08/27/2017 CLINICAL DATA:  Acute presentation with confusion.  Previous stroke. EXAM: MRI HEAD WITHOUT CONTRAST TECHNIQUE: Multiplanar, multiecho pulse sequences of the brain and surrounding structures were obtained without intravenous contrast. COMPARISON:  CT scan done yesterday.  MRI 07/23/2017 FINDINGS: Brain: Diffusion imaging shows approximately 10 subcentimeter foci of acute infarctions scattered within the cerebellum and both cerebral hemispheres, more numerous in the right than the left, consistent with micro embolic infarctions. No large confluent cortical infarction. No mass lesion, acute hemorrhage, hydrocephalus or extra-axial collection. There chronic small-vessel ischemic changes of the pons. There are old small vessel cerebellar infarctions. There are moderate chronic small-vessel ischemic changes of  the cerebral hemispheric white matter. There are foci of hemosiderin deposition within the deep brain consistent with micro hemorrhagic nature of many of the old small vessel insults. Vascular: Major vessels at the base of the brain show flow. Skull and upper cervical spine: Negative Sinuses/Orbits: Mucosal inflammatory changes of the right maxillary sinus. Orbits negative. Other: None IMPRESSION: Approximately 10 subcentimeter foci of acute infarction scattered throughout the brain, most numerous in the right cerebral hemisphere, consistent with micro embolic infarctions from the heart or ascending aorta. No large confluent infarction. Extensive old ischemic changes throughout the brain as outlined above. Electronically Signed   By: Nelson Chimes M.D.   On: 08/27/2017 15:36        Scheduled Meds: . amLODipine  10 mg Oral Daily  . apixaban  5 mg Oral BID  . aspirin  81 mg Oral Daily  . atorvastatin  80 mg Oral q1800  . carvedilol  25 mg Oral BID WC  . cinacalcet  90 mg Oral Q breakfast  . cloNIDine  0.2 mg Transdermal Q Tue  . collagenase   Topical Daily  . divalproex  500 mg Oral Q12H  . escitalopram  10 mg Oral Daily  . ferric citrate  420 mg Oral TID WC  . gabapentin  100 mg Oral BID  . hydrALAZINE  100 mg Oral Q8H  . irbesartan  300 mg Oral Daily  . isosorbide dinitrate  10 mg Oral BID  . mouth rinse  15 mL Mouth Rinse BID  . multivitamin  1 tablet Oral QHS  . nicotine  14 mg Transdermal Daily  . oxyCODONE  10 mg Oral Q12H  . pantoprazole  40 mg Oral Daily  . sevelamer carbonate  2,400 mg Oral TID WC  . sodium chloride flush  3 mL Intravenous Q12H   Continuous Infusions: . sodium chloride       LOS: 0 days    Time spent: 40 minutes    Edwin Dada, MD [name] 08/27/2017, 6:55 PM     Pager [phone] --- please page though AMION:  [email or web address] Password [email or web address] If 7PM-7AM, please contact night-coverage

## 2017-08-27 NOTE — Progress Notes (Signed)
Received pt from [place], [place]. HD tx initiated @ 1722 via 15Gx2 w/o problem per report. Pull/push/flush well per report, VSS, will cont to monitor while on HD tx

## 2017-08-27 NOTE — Progress Notes (Signed)
Patient sleeping during shift report.      

## 2017-08-27 NOTE — Progress Notes (Signed)
Patient transferred to low air mattress.  Alert and oriented x 4, with pain much better controled with scheduled and PRN medications. Family at bedside all night.  Not able to collect urine sample for urine culture due to patient not producing any urine during the night. Patient stated that since he started HD he is almost anuric.   Will continue to monitor.  Gracynn Rajewski, RN

## 2017-08-28 ENCOUNTER — Encounter: Payer: Medicare Other | Admitting: Registered Nurse

## 2017-08-28 LAB — CBC
HCT: 39 % (ref 39.0–52.0)
Hemoglobin: 12.1 g/dL — ABNORMAL LOW (ref 13.0–17.0)
MCH: 28.3 pg (ref 26.0–34.0)
MCHC: 31 g/dL (ref 30.0–36.0)
MCV: 91.3 fL (ref 78.0–100.0)
PLATELETS: 284 10*3/uL (ref 150–400)
RBC: 4.27 MIL/uL (ref 4.22–5.81)
RDW: 19.6 % — ABNORMAL HIGH (ref 11.5–15.5)
WBC: 11.3 10*3/uL — ABNORMAL HIGH (ref 4.0–10.5)

## 2017-08-28 LAB — URINALYSIS, ROUTINE W REFLEX MICROSCOPIC
Bacteria, UA: NONE SEEN
Bilirubin Urine: NEGATIVE
GLUCOSE, UA: 50 mg/dL — AB
HGB URINE DIPSTICK: NEGATIVE
Ketones, ur: NEGATIVE mg/dL
Leukocytes, UA: NEGATIVE
Nitrite: NEGATIVE
Protein, ur: 100 mg/dL — AB
SPECIFIC GRAVITY, URINE: 1.015 (ref 1.005–1.030)
pH: 7 (ref 5.0–8.0)

## 2017-08-28 LAB — BASIC METABOLIC PANEL
Anion gap: 14 (ref 5–15)
BUN: 18 mg/dL (ref 6–20)
CO2: 26 mmol/L (ref 22–32)
CREATININE: 4.55 mg/dL — AB (ref 0.61–1.24)
Calcium: 8.9 mg/dL (ref 8.9–10.3)
Chloride: 99 mmol/L — ABNORMAL LOW (ref 101–111)
GFR calc Af Amer: 17 mL/min — ABNORMAL LOW (ref >60)
GFR, EST NON AFRICAN AMERICAN: 14 mL/min — AB (ref >60)
Glucose, Bld: 102 mg/dL — ABNORMAL HIGH (ref 65–99)
POTASSIUM: 4 mmol/L (ref 3.5–5.1)
SODIUM: 139 mmol/L (ref 135–145)

## 2017-08-28 NOTE — Progress Notes (Signed)
Patient ID: Hayden [name], male   DOB: 09-08-1970, 47 y.o.   MRN: [id number]  [facility] Progress Note   Assessment/ Plan:   1. Acute encephalopathy/altered mental status: Initially suspected to be pharmacologically induced from ongoing narcotic analgesics with a question regarding as to whether there was a contributory effect from sodium thiosulfate. MRI done to follow-up on initial CT scan showed approximately 10 subcentimeter foci of acute infarction scattered throughout the brain, most numerous in the right cerebral hemisphere, consistent with micro embolic infarctions from the heart or ascending aorta. No large confluent infarction. Extensive old ischemic changes throughout the brain -this is likely to involve additional workup with TEE/TTE 2. ESRD: Will continue hemodialysis on his usual schedule of Monday/Wednesday/Friday schedule after uneventful hemodialysis treatment yesterday. Continue efforts to limit intradialytic hypotension in order not to worsen ischemic brain injury. 3. Anemia: Hemoglobin within acceptable range, continue to monitor for overt losses/PSA needs. 4. CKD-MBD: With recent calciphylaxis/leg wounds prompting need for avoidance of calcium containing binder/calcitriol and use of low calcium dialysate. 5. Nutrition: Continue nutritional supplementation/increase protein intake for wound healing 6. Hypertension: Blood pressure marginally elevated, continue to monitor on antihypertensive therapy/ultrafiltration with hemodialysis 7. Bipolar disorder 8. Severe stenosis, coronary artery disease, ischemic cardiomyopathy-not a surgical candidate  Subjective:   Reports to be feeling better today, tolerated dialysis without problems yesterday. "I want to go home, I do not see the reason why I need to continue staying here"-discuss results of MRI.    Objective:   BP (!) 141/62 (BP Location: Right Arm)   Pulse 71   Temp 98.2 F (36.8 C) (Oral)   Resp 18   Ht 5\' 9"   (1.753 m)   Wt 90.7 kg (200 lb)   SpO2 97%   BMI 29.53 kg/m   Physical Exam: Gen: Comfortably resting in bed, brother at bedside CVS: Pulse regular rhythm, 3/6 holosystolic murmur outflow tract Resp: Coarse breath sounds bilaterally, no rales/rhonchi Abd: Soft, obese, nontender Ext: Extensive left pretibial wound consistent with calciphylaxis, status post right above-knee amputation  Labs: BMET Recent Labs  Lab 08/25/17 1845 08/27/17 1809 08/28/17 0417  NA 137 136 139  K 3.7 3.8 4.0  CL 95* 97* 99*  CO2 28 26 26   GLUCOSE 80 96 102*  BUN 23* 30* 18  CREATININE 4.69* 5.81* 4.55*  CALCIUM 8.9 9.0 8.9  PHOS  --  3.1  --    CBC Recent Labs  Lab 08/25/17 1845 08/26/17 0445 08/27/17 1811 08/28/17 0417  WBC 11.8*  --  9.4 11.3*  HGB 12.1*  --  11.6* 12.1*  HCT 38.5* 37.2* 37.5* 39.0  MCV 89.7  --  90.1 91.3  PLT 329  --  290 284   Medications:    . amLODipine  10 mg Oral Daily  . apixaban  5 mg Oral BID  . aspirin  81 mg Oral Daily  . atorvastatin  80 mg Oral q1800  . carvedilol  25 mg Oral BID WC  . cinacalcet  90 mg Oral Q breakfast  . cloNIDine  0.2 mg Transdermal Q Tue  . collagenase   Topical Daily  . divalproex  500 mg Oral Q12H  . escitalopram  10 mg Oral Daily  . ferric citrate  420 mg Oral TID WC  . gabapentin  100 mg Oral BID  . hydrALAZINE  100 mg Oral Q8H  . irbesartan  300 mg Oral Daily  . isosorbide dinitrate  10 mg Oral BID  . mouth  rinse  15 mL Mouth Rinse BID  . multivitamin  1 tablet Oral QHS  . nicotine  14 mg Transdermal Daily  . oxyCODONE  10 mg Oral Q12H  . pantoprazole  40 mg Oral Daily  . sevelamer carbonate  2,400 mg Oral TID WC  . sodium chloride flush  3 mL Intravenous Q12H   Elmarie Shiley, MD 08/28/2017, 8:07 AM
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
## 2017-08-28 NOTE — Progress Notes (Signed)
Patient still refusing to let me change sacral dressing at this time. He says he will have it done this evening and I informed the patient that I leave at 7pm.

## 2017-08-28 NOTE — Progress Notes (Addendum)
PROGRESS NOTE    Hayden [name]  RCV:[id number] DOB: 05/05/1971 DOA: 08/26/2017 PCP: [name], DO      Brief Narrative:  Hayden [name] is a 47 y.o. male with ESRD, CAD, Aflutter on Eliquis, and recent calciphylaxis as well as recent embolic stroke who presents with three days global confusion.     Assessment & Plan:    Acute metabolic encephalopathy  Embolic infarction of the brain Pt initially presented with few days somnolence, confusion, lack of awareness.  In the ER, no focal signs of infection.  Ammonia normal, TSH, B12, folate, RPR normal.  CT head normal.    Thought to be possibly encephalopathy from opiates vs new embolic infarcts vs re-crudescence of emboli from last month.    MRI brain last night showed numerous acute infarcts.  Unclear to me if these are new from last month or not.  He was evaluated by Cardiology and CT surgery last month when he had the initial TTE that showed severe aortic stenosis, severe mitral calcification, no obvious masses/vegetations.  CT surgery ruled him out as a surgical candidate.   -Consult to Neurology for ?: are infarcts on MRI yesterday new or remnants from last month? -Continue aspirin, statin -Consult to Cardiology re: utility of TEE to evaluate for vegetation -Follow blood cultures  ESRD  -Consult Nephrology for maintenance HD  Calciphylaxis  -Continue pain control with oxycodone  Atrial flutter  Complete heart block CHADS-VASc at least 4 (CVA x2, CAD, HTN).  Has pacer.  -Continue Eliquis  CAD; elevated troponin  No angina.  Troponin flat, suspect poor clearance from ESRD.   -Continue ASA, statin, beta-blocker, ARB, and nitrates    Unstageable sacral pressure ulcer, left inner gluteal fold Present on arrival        DVT prophylaxis: Eliquis  Code Status: DNR  Family Communication: Significant other and son updated at bedside Disposition Plan: Observe on telemetry, consult to Neuro and Cardiology.  If  TEE unnecessary, and prognosis provided by Neuro, likely home in next 1-2 days. Consults called: Cardiology, Neurology, Nephrology    Subjective: More alert today.  Oriented to situation.  Depressed.  No focal numbness, weakness, slurred speech.  Leg still hurts.  No fever, cough, sputum production.     Objective: Vitals:   08/27/17 2225 08/28/17 0123 08/28/17 0442 08/28/17 1219  BP: (!) 183/78 (!) 150/68 (!) 141/62 (!) 141/66  Pulse: 75 68 71 65  Resp: 20 18 18 18   Temp: 98.2 F (36.8 C) 97.9 F (36.6 C) 98.2 F (36.8 C) 98.4 F (36.9 C)  TempSrc: Oral Oral Oral Oral  SpO2: 94% 98% 97% 93%  Weight:   90.7 kg (200 lb)   Height:        Intake/Output Summary (Last 24 hours) at 08/28/2017 1542 Last data filed at 08/28/2017 1301 Gross per 24 hour  Intake 840 ml  Output 2800 ml  Net -1960 ml   Filed Weights   08/27/17 0430 08/27/17 2149 08/28/17 0442  Weight: 92.1 kg (203 lb) 89.6 kg (197 lb 8.5 oz) 90.7 kg (200 lb)    Examination: General appearance: Chronically ill appearing adult male, alert and in no acute distress.   HEENT: Anicteric, conjunctiva pink, lids and lashes normal. No nasal deformity, discharge, epistaxis.  Lips normal.   Skin: Warm and dry.  There is large black ulcers of the left lower leg, unchanged, no surrounding redness. Cardiac: RRR, nl Z0-C5, loud systolic murmur.  No change. Respiratory: Normal respiratory rate  and rhythm.  CTAB without rales or wheezes. Abdomen: Abdomen soft.  No TTP. MSK: No deformities or effusions.  Right AKA. Neuro: Awake and interactive.  Oriented x4. Cranial nerves normal.  Strength in upper extremities equal, in LLE normal.  EOMI, moves all extremities. Speech fluent.    Psych: Sensorium intact and responding to questions, attention normal. Affect normal.  Judgment and insight appear good.    Data Reviewed: I have personally reviewed following labs and imaging studies:  CBC: Recent Labs  Lab 08/25/17 1845  08/26/17 0445 08/27/17 1811 08/28/17 0417  WBC 11.8*  --  9.4 11.3*  HGB 12.1*  --  11.6* 12.1*  HCT 38.5* 37.2* 37.5* 39.0  MCV 89.7  --  90.1 91.3  PLT 329  --  290 671   Basic Metabolic Panel: Recent Labs  Lab 08/25/17 1845 08/27/17 1809 08/28/17 0417  NA 137 136 139  K 3.7 3.8 4.0  CL 95* 97* 99*  CO2 28 26 26   GLUCOSE 80 96 102*  BUN 23* 30* 18  CREATININE 4.69* 5.81* 4.55*  CALCIUM 8.9 9.0 8.9  PHOS  --  3.1  --    GFR: Estimated Creatinine Clearance: 22.8 mL/min (A) (by C-G formula based on SCr of 4.55 mg/dL (H)). Liver Function Tests: Recent Labs  Lab 08/25/17 1845 08/27/17 1809  AST 38  --   ALT 32  --   ALKPHOS 389*  --   BILITOT 0.9  --   PROT 7.1  --   ALBUMIN 2.8* 2.6*   No results for input(s): LIPASE, AMYLASE in the last 168 hours. Recent Labs  Lab 08/26/17 0140  AMMONIA 33   Coagulation Profile: No results for input(s): INR, PROTIME in the last 168 hours. Cardiac Enzymes: Recent Labs  Lab 08/26/17 0140 08/26/17 0445 08/26/17 1122 08/27/17 0022 08/27/17 0651  TROPONINI 0.08* 0.09* 0.10* 0.07* 0.07*   BNP (last 3 results) No results for input(s): PROBNP in the last 8760 hours. HbA1C: No results for input(s): HGBA1C in the last 72 hours. CBG: Recent Labs  Lab 08/26/17 0529  GLUCAP 85   Lipid Profile: No results for input(s): CHOL, HDL, LDLCALC, TRIG, CHOLHDL, LDLDIRECT in the last 72 hours. Thyroid Function Tests: Recent Labs    08/26/17 0445  TSH 6.241*   Anemia Panel: Recent Labs    08/26/17 0445  VITAMINB12 1,203*   Urine analysis:    Component Value Date/Time   COLORURINE YELLOW 08/28/2017 0109   APPEARANCEUR CLEAR 08/28/2017 0109   LABSPEC 1.015 08/28/2017 0109   PHURINE 7.0 08/28/2017 0109   GLUCOSEU 50 (A) 08/28/2017 0109   HGBUR NEGATIVE 08/28/2017 0109   BILIRUBINUR NEGATIVE 08/28/2017 0109   KETONESUR NEGATIVE 08/28/2017 0109   PROTEINUR 100 (A) 08/28/2017 0109   UROBILINOGEN 0.2 09/22/2012 1805    NITRITE NEGATIVE 08/28/2017 0109   LEUKOCYTESUR NEGATIVE 08/28/2017 0109   Sepsis Labs: @LABRCNTIP (procalcitonin:4,lacticacidven:4)  ) Recent Results (from the past 240 hour(s))  MRSA PCR Screening     Status: None   Collection Time: 08/26/17 11:11 PM  Result Value Ref Range Status   MRSA by PCR NEGATIVE NEGATIVE Final    Comment:        The GeneXpert MRSA Assay (FDA approved for NASAL specimens only), is one component of a comprehensive MRSA colonization surveillance program. It is not intended to diagnose MRSA infection nor to guide or monitor treatment for MRSA infections. Performed at [facility], [place] [address], [place], [place] [address]   Culture,  blood (routine x 2)     Status: None (Preliminary result)   Collection Time: 08/27/17 11:00 AM  Result Value Ref Range Status   Specimen Description BLOOD RIGHT ANTECUBITAL  Final   Special Requests   Final    BOTTLES DRAWN AEROBIC ONLY Blood Culture adequate volume   Culture   Final    NO GROWTH 1 DAY Performed at [facility], [place] [address] [place], [place] [address]    Report Status PENDING  Incomplete  Culture, blood (routine x 2)     Status: None (Preliminary result)   Collection Time: 08/27/17 11:05 AM  Result Value Ref Range Status   Specimen Description BLOOD RIGHT ANTECUBITAL  Final   Special Requests   Final    BOTTLES DRAWN AEROBIC ONLY Blood Culture adequate volume   Culture   Final    NO GROWTH 1 DAY Performed at [facility], [place] [address] [place], [place] [address]    Report Status PENDING  Incomplete  Culture, blood (single)     Status: None (Preliminary result)   Collection Time: 08/27/17 12:33 PM  Result Value Ref Range Status   Specimen Description BLOOD RIGHT ANTECUBITAL  Final   Special Requests IN PEDIATRIC BOTTLE Blood Culture adequate volume  Final   Culture   Final    NO GROWTH 1 DAY Performed at [facility], [place] [address], [place],  [address]     Report Status PENDING  Incomplete         Radiology Studies: Mr Brain Wo Contrast  Result Date: 08/27/2017 CLINICAL DATA:  Acute presentation with confusion.  Previous stroke. EXAM: MRI HEAD WITHOUT CONTRAST TECHNIQUE: Multiplanar, multiecho pulse sequences of the brain and surrounding structures were obtained without intravenous contrast. COMPARISON:  CT scan done yesterday.  MRI 07/23/2017 FINDINGS: Brain: Diffusion imaging shows approximately 10 subcentimeter foci of acute infarctions scattered within the cerebellum and both cerebral hemispheres, more numerous in the right than the left, consistent with micro embolic infarctions. No large confluent cortical infarction. No mass lesion, acute hemorrhage, hydrocephalus or extra-axial collection. There chronic small-vessel ischemic changes of the pons. There are old small vessel cerebellar infarctions. There are moderate chronic small-vessel ischemic changes of the cerebral hemispheric white matter. There are foci of hemosiderin deposition within the deep brain consistent with micro hemorrhagic nature of many of the old small vessel insults. Vascular: Major vessels at the base of the brain show flow. Skull and upper cervical spine: Negative Sinuses/Orbits: Mucosal inflammatory changes of the right maxillary sinus. Orbits negative. Other: None IMPRESSION: Approximately 10 subcentimeter foci of acute infarction scattered throughout the brain, most numerous in the right cerebral hemisphere, consistent with micro embolic infarctions from the heart or ascending aorta. No large confluent infarction. Extensive old ischemic changes throughout the brain as outlined above. Electronically Signed   By: Nelson Chimes M.D.   On: 08/27/2017 15:36        Scheduled Meds: . amLODipine  10 mg Oral Daily  . apixaban  5 mg Oral BID  . aspirin  81 mg Oral Daily  . atorvastatin  80 mg Oral q1800  . carvedilol  25 mg Oral BID WC  . cinacalcet  90 mg Oral Q breakfast   . cloNIDine  0.2 mg Transdermal Q Tue  . collagenase   Topical Daily  . divalproex  500 mg Oral Q12H  . escitalopram  10 mg Oral Daily  . ferric citrate  420 mg Oral TID  WC  . gabapentin  100 mg Oral BID  . hydrALAZINE  100 mg Oral Q8H  . irbesartan  300 mg Oral Daily  . isosorbide dinitrate  10 mg Oral BID  . mouth rinse  15 mL Mouth Rinse BID  . multivitamin  1 tablet Oral QHS  . nicotine  14 mg Transdermal Daily  . oxyCODONE  10 mg Oral Q12H  . pantoprazole  40 mg Oral Daily  . sevelamer carbonate  2,400 mg Oral TID WC  . sodium chloride flush  3 mL Intravenous Q12H   Continuous Infusions: . sodium chloride       LOS: 1 day    Time spent: 40 minutes    Edwin Dada, MD [name] 08/28/2017, 3:42 PM     Pager [phone] --- please page though AMION:  [email or web address] Password [email or web address] If 7PM-7AM, please contact night-coverage

## 2017-08-29 ENCOUNTER — Inpatient Hospital Stay (HOSPITAL_COMMUNITY): Payer: Medicare Other

## 2017-08-29 DIAGNOSIS — G894 Chronic pain syndrome: Secondary | ICD-10-CM

## 2017-08-29 DIAGNOSIS — G9340 Encephalopathy, unspecified: Secondary | ICD-10-CM

## 2017-08-29 DIAGNOSIS — I361 Nonrheumatic tricuspid (valve) insufficiency: Secondary | ICD-10-CM

## 2017-08-29 LAB — ECHOCARDIOGRAM COMPLETE
AO mean calculated velocity dopler: 325 cm/s
AV Area VTI index: 0.32 cm2/m2
AV Area VTI: 0.71 cm2
AV Area mean vel: 0.63 cm2
AV Mean grad: 48 mmHg
AV Peak grad: 81 mmHg
AV area mean vel ind: 0.3 cm2/m2
AV peak Index: 0.33
AV pk vel: 450 cm/s
AVCELMEANRAT: 0.2
Ao pk vel: 0.23 m/s
Ao-asc: 33 cm
Area-P 1/2: 2.89 cm2
CHL CUP AV VEL: 0.69
CHL CUP DOP CALC LVOT VTI: 23.5 cm
CHL CUP RV SYS PRESS: 50 mmHg
EWDT: 261 ms
FS: 39 % (ref 28–44)
HEIGHTINCHES: 69 in
IV/PV OW: 0.7
LA ID, A-P, ES: 59 mm
LA diam end sys: 59 mm
LA vol index: 52.8 mL/m2
LADIAMINDEX: 2.75 cm/m2
LAVOL: 113 mL
LAVOLA4C: 113 mL
LV PW d: 18.8 mm — AB (ref 0.6–1.1)
LVOT SV: 74 mL
LVOT area: 3.14 cm2
LVOT peak VTI: 0.22 cm
LVOT peak grad rest: 4 mmHg
LVOTD: 20 mm
LVOTPV: 102 cm/s
Lateral S' vel: 11.2 cm/s
MV Dec: 261
MV Peak grad: 16 mmHg
MV pk A vel: 194 m/s
MVPKEVEL: 201 m/s
MVSPHT: 76 ms
P 1/2 time: 388 ms
RV TAPSE: 22.5 mm
Reg peak vel: 296 cm/s
TR max vel: 296 cm/s
VTI: 107 cm
Valve area index: 0.32
Valve area: 0.69 cm2
WEIGHTICAEL: 3152 [oz_av]

## 2017-08-29 LAB — RENAL FUNCTION PANEL
ANION GAP: 14 (ref 5–15)
Albumin: 2.4 g/dL — ABNORMAL LOW (ref 3.5–5.0)
BUN: 33 mg/dL — AB (ref 6–20)
CHLORIDE: 98 mmol/L — AB (ref 101–111)
CO2: 26 mmol/L (ref 22–32)
Calcium: 9.1 mg/dL (ref 8.9–10.3)
Creatinine, Ser: 6.32 mg/dL — ABNORMAL HIGH (ref 0.61–1.24)
GFR calc Af Amer: 11 mL/min — ABNORMAL LOW (ref >60)
GFR calc non Af Amer: 10 mL/min — ABNORMAL LOW (ref >60)
GLUCOSE: 97 mg/dL (ref 65–99)
PHOSPHORUS: 3.9 mg/dL (ref 2.5–4.6)
POTASSIUM: 3.9 mmol/L (ref 3.5–5.1)
Sodium: 138 mmol/L (ref 135–145)

## 2017-08-29 LAB — CBC
HEMATOCRIT: 39.6 % (ref 39.0–52.0)
HEMOGLOBIN: 12.1 g/dL — AB (ref 13.0–17.0)
MCH: 28.2 pg (ref 26.0–34.0)
MCHC: 30.6 g/dL (ref 30.0–36.0)
MCV: 92.3 fL (ref 78.0–100.0)
Platelets: 301 10*3/uL (ref 150–400)
RBC: 4.29 MIL/uL (ref 4.22–5.81)
RDW: 20.1 % — ABNORMAL HIGH (ref 11.5–15.5)
WBC: 11.2 10*3/uL — ABNORMAL HIGH (ref 4.0–10.5)

## 2017-08-29 LAB — URINE CULTURE: CULTURE: NO GROWTH

## 2017-08-29 NOTE — Care Management Note (Signed)
Case Management Note  Patient Details  Name: Hayden [name] MRN: [id number] Date of Birth: 06-25-1971  Subjective/Objective:   Acute Encephalopathy               Action/Plan: Patient lives at home; PCP: [name], DO; has private insurance with Medicare; pharmacy of choice is [facility]; DME - wheelchair at home; He is followed by [facility] and [facility] for Outpatient Palliative Care services; receiving [facility] services provided by [facility]; talked to [place] with [facility], he is receiving HHRN/PT/OT; resumption of service orders faxed  [phone]);  Expected Discharge Date:    possibly 08/29/2017              Expected Discharge Plan:  [place]  Discharge planning Services  CM Consult  Choice offered to:  Patient  HH Arranged:  RN,PT,OT [place] Agency:   [place]  Status of Service:  In process, will continue to follow  Royston Bake, RN,MHA,[phone] 08/29/2017, 3:15 PM

## 2017-08-29 NOTE — Progress Notes (Signed)
Could u order CT head and neck follow-up as an outpt, pt has poor access for another IV, especially in the A/C, again may need to do this as an out pt, Thanks Buckner [place].

## 2017-08-29 NOTE — Progress Notes (Signed)
Patient ready for discharge. Family here to accompany. All personal belongings with patient. Discharge rx and instructions along with follow up appts reviewed with pt. Pt demonstrates no s/sx of distress. Pt agreeable to home care and following plan of care.

## 2017-08-29 NOTE — Progress Notes (Addendum)
NEUROHOSPITALISTS STROKE TEAM - DAILY PROGRESS NOTE   ADMISSION HISTORY: Hayden [name] is an 47 y.o. male past medical history of atrial flutter on Eliquis, end-stage renal disease, hypertension, hyperlipidemia, congestive heart failure, kidney cancer, diabetes mellitus, right leg below the knee amputation being admitted to the hospital on 2-26 for confusion lasting 3 days. MRI was obtained which showed bilateral punctate infarcts. Neurology was consulted for further recommendations.  Date last known well: 12.24.19 tPA Given: outside window  Baseline MRS 3  SUBJECTIVE (INTERVAL HISTORY) Family is at the bedside. Patient is found laying in bed in NAD. Overall he feels his condition is completely resolved. Voices no new complaints. No new events reported overnight. Long discussion with family regarding AC therapy with Eliqus vs Coumadin on HD. Explained risks of further strokes and encouraged Palliative care consultation and coordination of care. Meeting set up for next week. Wife states patient has been very compliant with Eliquis and no missed doses are reported.    OBJECTIVE Lab Results: CBC:  Recent Labs  Lab 08/27/17 1811 08/28/17 0417 08/29/17 0700  WBC 9.4 11.3* 11.2*  HGB 11.6* 12.1* 12.1*  HCT 37.5* 39.0 39.6  MCV 90.1 91.3 92.3  PLT 290 284 301   BMP: Recent Labs  Lab 08/25/17 1845 08/27/17 1809 08/28/17 0417 08/29/17 0700  NA 137 136 139 138  K 3.7 3.8 4.0 3.9  CL 95* 97* 99* 98*  CO2 28 26 26 26   GLUCOSE 80 96 102* 97  BUN 23* 30* 18 33*  CREATININE 4.69* 5.81* 4.55* 6.32*  CALCIUM 8.9 9.0 8.9 9.1  PHOS  --  3.1  --  3.9   Liver Function Tests:  Recent Labs  Lab 08/25/17 1845 08/27/17 1809 08/29/17 0700  AST 38  --   --   ALT 32  --   --   ALKPHOS 389*  --   --   BILITOT 0.9  --   --   PROT 7.1  --   --   ALBUMIN 2.8* 2.6* 2.4*   No results for input(s): LIPASE, AMYLASE in the last 168  hours. Recent Labs  Lab 08/26/17 0140  AMMONIA 33   Thyroid Function Studies: No results for input(s): TSH, T4TOTAL, T3FREE, THYROIDAB in the last 72 hours. Cardiac Enzymes:  Recent Labs  Lab 08/26/17 0140 08/26/17 0445 08/26/17 1122 08/27/17 0022 08/27/17 0651  TROPONINI 0.08* 0.09* 0.10* 0.07* 0.07*   No results for input(s): PROBNP in the last 168 hours. Coagulation Studies: No results for input(s): APTT, INR in the last 72 hours. No results for input(s): DDIMER in the last 72 hours. [place] Work -Up: No results for input(s): VITAMINB12, FOLATE, IRON, RETICCTPCT in the last 72 hours. Microbiology:  Recent Results (from the past 240 hour(s))  MRSA PCR Screening     Status: None   Collection Time: 08/26/17 11:11 PM  Result Value Ref Range Status   MRSA by PCR NEGATIVE NEGATIVE Final    Comment:        The GeneXpert MRSA Assay (FDA approved for NASAL specimens only), is one component of a comprehensive MRSA colonization surveillance program. It is not intended to diagnose MRSA infection nor to guide or monitor treatment for MRSA infections. Performed at [facility], [place] [address] [place], [place] [address]   Culture, blood (routine x 2)     Status: None (Preliminary result)   Collection Time: 08/27/17 11:00 AM  Result Value Ref Range Status   Specimen Description BLOOD  RIGHT ANTECUBITAL  Final   Special Requests   Final    BOTTLES DRAWN AEROBIC ONLY Blood Culture adequate volume   Culture   Final    NO GROWTH 2 DAYS Performed at [facility], [address], [place], [place] [address]    Report Status PENDING  Incomplete  Culture, blood (routine x 2)     Status: None (Preliminary result)   Collection Time: 08/27/17 11:05 AM  Result Value Ref Range Status   Specimen Description BLOOD RIGHT ANTECUBITAL  Final   Special Requests   Final    BOTTLES DRAWN AEROBIC ONLY Blood Culture adequate volume   Culture   Final    NO GROWTH 2 DAYS Performed at  [facility], [place] [address] [place], [place] [address]    Report Status PENDING  Incomplete  Culture, blood (single)     Status: None (Preliminary result)   Collection Time: 08/27/17 12:33 PM  Result Value Ref Range Status   Specimen Description BLOOD RIGHT ANTECUBITAL  Final   Special Requests IN PEDIATRIC BOTTLE Blood Culture adequate volume  Final   Culture   Final    NO GROWTH 2 DAYS Performed at [facility], [place] [address], [place], [place] [address]    Report Status PENDING  Incomplete  Urine Culture     Status: None   Collection Time: 08/27/17 11:47 PM  Result Value Ref Range Status   Specimen Description URINE, CLEAN CATCH  Final   Special Requests NONE  Final   Culture   Final    NO GROWTH Performed at [facility], [place] [address], [place],  [address]    Report Status 08/29/2017 FINAL  Final   Urinalysis:  Recent Labs  Lab 08/28/17 0109  COLORURINE YELLOW  APPEARANCEUR CLEAR  LABSPEC 1.015  PHURINE 7.0  GLUCOSEU 50*  HGBUR NEGATIVE  BILIRUBINUR NEGATIVE  KETONESUR NEGATIVE  PROTEINUR 100*  NITRITE NEGATIVE  LEUKOCYTESUR NEGATIVE   Urine Drug Screen:     Component Value Date/Time   LABOPIA POSITIVE (A) 09/22/2012 1805   COCAINSCRNUR NONE DETECTED 09/22/2012 1805   LABBENZ NONE DETECTED 09/22/2012 1805   AMPHETMU NONE DETECTED 09/22/2012 1805   THCU NONE DETECTED 09/22/2012 1805   LABBARB NONE DETECTED 09/22/2012 1805    Alcohol Level: No results for input(s): ETH in the last 168 hours.  PHYSICAL EXAM Temp:  [97.6 F (36.4 C)-98.8 F (37.1 C)] 98.8 F (37.1 C) (03/01 1257) Pulse Rate:  [61-76] 76 (03/01 1257) Resp:  [18-20] 18 (03/01 1137) BP: (104-166)/(64-79) 146/78 (03/01 1519) SpO2:  [92 %-96 %] 95 % (03/01 1257) Weight:  [89.4 kg (197 lb)-92.1 kg (203 lb)] 89.4 kg (197 lb) (03/01 1137) General - Well nourished, well developed, in no apparent distress HEENT-  Normocephalic,  Cardiovascular - Regular rate and rhythm   Respiratory - Lungs clear bilaterally. No wheezing. Abdomen - soft and non-tender, BS normal Extremities- Right BKA Mental Status: Alert, oriented, thought content appropriate.  Speech fluent without evidence of aphasia. Able to follow 3 step commands without difficulty. Cranial Nerves: II: Visual fields grossly normal,  III,IV, VI: ptosis not present, extra-ocular motions intact bilaterally, pupils equal, round, reactive to light and accommodation V,VII: smile symmetric, facial light touch sensation normal bilaterally VIII: hearing normal bilaterally IX,X: uvula rises symmetrically XI: bilateral shoulder shrug XII: midline tongue extension Motor: Right : Upper extremity   5/5    Left:     Upper extremity  5/5  Lower extremity   BKA                            Lower extremity   5/5 Tone and bulk:normal tone throughout; no atrophy noted Sensory: Pinprick and light touch intact throughout, bilaterally Deep Tendon Reflexes: 2+ and symmetric throughout Plantars: Right: downgoing   Left: downgoing Cerebellar: normal finger-to-nose, normal rapid alternating movements and normal heel-to-shin test Gait: did not assess   IMAGING: I have personally reviewed the radiological images below and agree with the radiology interpretations.  MRI HEAD WITHOUT CONTRAST 08/27/2017 15:36 Approximately 10 subcentimeter foci of acute infarction scattered throughout the brain, most numerous in the right cerebral hemisphere, consistent with micro embolic infarctions from the heart or ascending aorta. No large confluent infarction. Extensive old ischemic changes throughout the brain as outlined above.  Echocardiogram:                    completed and results   PENDING     IMPRESSION: Hayden [name] is a 47 y.o. male with PMH of  atrial flutter on Eliquis, end-stage renal disease, hypertension, hyperlipidemia, congestive heart failure, kidney cancer, diabetes mellitus, right leg below the knee  amputation being admitted to the hospital on 2-26 for confusion lasting 3 days. MRI shows acute embolic bilateral strokes.  Subacute/Acute Ischemic Stroke    Suspected Etiology: likely cardioembolic Resultant Symptoms: AMS Stroke Risk Factors: atrial fibrillation, hyperlipidemia and hypertension, kidney Ca Other Stroke Risk Factors: Cigarette smoker, Hx stroke, ESRD on HD, CHF  Outstanding Stroke Work-up Studies:    Work up completed  PLAN  08/29/2017: Continue Aspirin/ Eliquis/ Statin Frequent neuro checks Telemetry monitoring PT/OT/SLP Ongoing aggressive stroke risk factor management Patient counseled to be compliant with his antithrombotic medications Patient counseled on Lifestyle modifications including, Diet, Exercise, and Stress Follow up with [facility] in 6 weeks  AFIB, CHRONIC: Continue Eliquis for now May consider switching back to Coumadin if Nephrology in agreement  SEIZURES: No seizure activity reported overnight Continue Depakote Follow up with outpatient Neurology  HYPERTENSION: Stable Permissive hypertension (OK if <220/120) for 24-48 hours post stroke and then gradually normalized within 5-7 days. Long term BP goal normotensive. May slowly restart home B/P medications after 48 hours Home Meds: Multiple  HYPERLIPIDEMIA:    Component Value Date/Time   CHOL 144 05/03/2017 0556   TRIG 61 05/03/2017 0556   HDL 53 05/03/2017 0556   CHOLHDL 2.7 05/03/2017 0556   VLDL 12 05/03/2017 0556   LDLCALC 79 05/03/2017 0556  Home Meds:  Lipitor 80 mg LDL  goal < 70 Continued on  Lipitor to 80 mg daily Continue statin at discharge  DIABETES: Lab Results  Component Value Date   HGBA1C 5.4 06/20/2015  HgbA1c goal < 7.0  TOBACCO ABUSE Current smoker Smoking cessation counseling provided Nicotine patch provided  Other Active Problems: Principal Problem:   Acute encephalopathy Active Problems:   History of CVA (cerebrovascular accident)    ESRD on hemodialysis (HCC)   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 Yes; Fluid consistency: Thin Diet - low sodium heart healthy   FAMILY UPDATES:  family at bedside  TEAM UPDATES: Edwin Dada, *   Prior Home Stroke Medications:  aspirin 81 mg daily and Eliquis (apixaban) daily  Discharge Stroke Meds:  Please discharge patient on aspirin 81 mg daily and Eliquis (apixaban) daily   Disposition: 06-Home-Health Care Svc Therapy Recs:               HOME Follow Up:  Follow-up Information    Le, Thao P, DO. Go on 09/03/2017.   Specialty:  Family Medicine Why:  @2 :45pm Contact information: [place] [address] [place] Follow up.   Why:  They will continue to do your home health care at your home Contact information: [facility] [address] [place] [address] tele # [phone]       Garvin Fila, MD. Go on 09/25/2017.   Specialties:  Neurology, Radiology Why:  at 10:30 AM Contact information: [address] [place] [address] [phone]          [name], DO -PCP Follow up in 1-2 weeks      Assessment & plan discussed with with attending physician and they are in agreement.    Mary Sella, ANP-C Stroke Neurology Team 08/29/2017 3:40 PM   08/29/2017 ATTENDING ASSESSMENT:   I reviewed above note and agree with the assessment and plan. I have made any additions or clarifications directly to the above note. Pt was seen and examined.   47 year old male has history of ESRD on hemodialysis, smoker, CHF, CAD/non-STEMI, hypertension, diabetes, seizure on Depakote and renal carcinoma.  He had stroke in 06/2015  with right sided numbness for 3 days.  MRI showed left PCA scattered infarcts including left thalamic and left occipital infarcts.  EF 55-60%.  UDS showed positive for THC.  Carotid Doppler negative.  LDL 125 A1c 5.4.  He was continue on aspirin 81 and pravastatin 20.  In 10/2016, he was admitted for encephalopathy, EEG negative.  Had a PAD status post right AKA.  In 05/2017 he was found to have complete heart block, put on pacemaker.  After that, he was also found to have a flutter, started on Coumadin.  In 07/2017, patient admitted for altered mental status.  MRI showed embolic patter including bilateral cerebellum and right MCA/ACA infarcts.  INR subtherapeutic.  Ejection fraction 35-40%.  Coumadin resumed with goal 2-3.  However, he was found to have left leg ulcer, biopsy showed calciphylaxis, concerning to be related with Coumadin use.  Coumadin stopped and changed to Eliquis.  Continue on aspirin 81.  This time patient admitted for confusion for 3 days.  CT no acute abnormality.  Ammonium level, TSH, B12, RPR all negative.  MRI showed right temporal infarcts as well as right MCA/ACA Infarcts.  EF normal on 2D echo today.  As per wife and patient, his blood pressure tends to be low at home, SBP 110s.  He is already on Eliquis 5 mg twice daily and aspirin 81 at home, and patient stated compliant with medication.  Therefore, he is new stroke likely due to relatively low in the setting of intracranial stenosis.  Will recommend continue Eliquis and aspirin at home, and avoid hypotension, BP goal 120-140.  Neurology has no additions at this time. Neurology will sign off. Please call with questions. Pt will follow up with Dr. Leonie Man at [facility] on 09/25/17. Thanks for the consult.  Rosalin Hawking, MD PhD Stroke Neurology 08/29/2017  7:48 PM   To contact Stroke Continuity provider, please refer to [email or web address] After hours, contact General Neurology

## 2017-08-29 NOTE — Procedures (Signed)
Patient seen on Hemodialysis. QB 400, UF goal 3L Treatment adjusted as needed.  Elmarie Shiley MD [facility]. Office # [phone] Pager # [phone] 9:34 AM

## 2017-08-29 NOTE — Progress Notes (Signed)
  Echocardiogram 2D Echocardiogram has been performed.  Hayden [name] [name] 08/29/2017, 3:11 PM

## 2017-08-29 NOTE — Discharge Summary (Signed)
Physician Discharge Summary  Hayden [name] ZOX:[id number] DOB: 20-Dec-1970 DOA: 08/26/2017  PCP: Hayden Bayley, DO  Admit date: 08/26/2017 Discharge date: 08/29/2017  Admitted From: Home  Disposition:  Home with Hospice to follow   Recommendations for Outpatient Follow-up:  1. Follow up with Nephrology in 1 week 2. Follow up with Neurology in 4 weeks   Home Health: None  Equipment/Devices: None  Discharge Condition: Poor  CODE STATUS: DO NOT RESUSCITATE Diet recommendation: Renal  Brief/Interim Summary: Hayden [name] is a 47 yo M with ESRD on HD, HTN, Aflutter on Eliquis, DM and right BKA who presents with encephalopathy for three days.     Encephalopathy from stroke -MRI showed several new scattered embolic appearing strokes.   -His encephalopathy cleared quickly. -The patient was seen by Neurology who recommended continuation of Eliquis, aspirin and statin.   -Echocardiogram showed no valve vegetation, just his known severe aortic stenosis and mitral stenosis (he was previously seen by CT surgery last month, deemed not a surgical candidate for valve repair).   -There was discussion of converting back to warfarin (due to his stroke last month while subtherapeutic on warfarin, and current stroke after converting to Eliquis), but patient and wife were concerned about previous Nephrology advice to avoid warfarin in setting of calciphylaxis. -Given his severe valve disease, recurrent strokes, and calciphylaxis, it was reasonably estimated that his life expectancy is less than six months, family are aware, and he was discharged to home with Hospice to follow         Discharge Diagnoses:  Principal Problem:   Acute encephalopathy Active Problems:   History of CVA (cerebrovascular accident)   ESRD on hemodialysis ([place])   Complete heart block ([place]) - s/[name] PPM   Atrial flutter ([place]); CHA2DS2Vasc -5, on warfarin   Elevated troponin   Calciphylaxis   Chronic pain syndrome   Pressure  injury of skin    Discharge Instructions  Discharge Instructions    Diet - low sodium heart healthy   Complete by:  As directed    Discharge instructions   Complete by:  As directed    From Hayden [name]: You have two cholesterol medicines on your medicine list (these help reduce risks of stroke, so they are worth paying attention to):  Take atorvastatin 80 mg nightly Stop pravastatin  Continue your Eliquis and aspirin. As you and I discussed, there is a theoretical risk that Eliquis doesn't work as well in patients on Dialysis, but this has to be balanced against the theoretical risk that warfarin caused or worsened your calciphylaxis.  Hayden [name] from Neurology feels the Eliquis and aspirin should be adequate.  You may discuss with Hayden [name], but I think this would put my mind at ease as well.   Increase activity slowly   Complete by:  As directed      Allergies as of 08/29/2017      Reactions   Dilaudid [hydromorphone Hcl] Other (See Comments)   ABNORMAL BEHAVIOR, "VERBALLY AND PHYSICALLY ABUSIVE," PSYCHOSIS   Morphine And Related Itching   Adhesive [tape] Other (See Comments)   Redness from adhesive tape if left on too long, paper tape is preferred      Medication List    STOP taking these medications   pravastatin 40 MG tablet Commonly known as:  PRAVACHOL     TAKE these medications   amLODipine 10 MG tablet Commonly known as:  NORVASC Take 1 tablet (10 mg total) by mouth daily.   apixaban  5 MG Tabs tablet Commonly known as:  ELIQUIS Take 1 tablet (5 mg total) by mouth 2 (two) times daily.   aspirin 81 MG chewable tablet Chew 1 tablet (81 mg total) by mouth daily.   atorvastatin 80 MG tablet Commonly known as:  LIPITOR Take 1 tablet (80 mg total) by mouth daily at 6 PM.   carvedilol 25 MG tablet Commonly known as:  COREG Take 1 tablet (25 mg total) by mouth 2 (two) times daily.   cinacalcet 30 MG tablet Commonly known as:  SENSIPAR Take 3 tablets (90 mg  total) by mouth daily with breakfast.   cloNIDine 0.2 mg/24hr patch Commonly known as:  CATAPRES - Dosed in mg/24 hr Place 1 patch (0.2 mg total) onto the skin every Tuesday.   divalproex 125 MG capsule Commonly known as:  DEPAKOTE SPRINKLE Take 4 capsules (500 mg total) by mouth every 12 (twelve) hours.   escitalopram 10 MG tablet Commonly known as:  LEXAPRO Take 1 tablet (10 mg total) by mouth daily.   ferric citrate 1 GM 210 MG(Fe) tablet Commonly known as:  AURYXIA Take 2 tablets (420 mg total) by mouth 3 (three) times daily with meals. What changed:    how much to take  when to take this  additional instructions   gabapentin 100 MG capsule Commonly known as:  NEURONTIN Take 1 capsule (100 mg total) by mouth 2 (two) times daily.   hydrALAZINE 100 MG tablet Commonly known as:  APRESOLINE Take 1 tablet (100 mg total) by mouth every 8 (eight) hours.   irbesartan 300 MG tablet Commonly known as:  AVAPRO Take 1 tablet (300 mg total) by mouth daily.   isosorbide dinitrate 10 MG tablet Commonly known as:  ISORDIL Take 1 tablet (10 mg total) by mouth 2 (two) times daily.   multivitamin Tabs tablet Take 1 tablet by mouth at bedtime.   nicotine 14 mg/24hr patch Commonly known as:  NICODERM CQ - dosed in mg/24 hours Place 14 mg onto the skin daily.   nitroGLYCERIN 0.4 MG SL tablet Commonly known as:  NITROSTAT Place 0.4 mg under the tongue every 5 (five) minutes as needed for chest pain.   omeprazole 40 MG capsule Commonly known as:  PRILOSEC Take 1 capsule (40 mg total) by mouth daily.   oxyCODONE 20 mg 12 hr tablet Commonly known as:  OXYCONTIN Take 1 tablet (20 mg total) by mouth every 12 (twelve) hours.   Oxycodone HCl 10 MG Tabs Take 1 tablet (10 mg total) by mouth every 4 (four) hours as needed.   oxyCODONE ER 18 MG C12a Commonly known as:  XTAMPZA ER Take 18 mg by mouth every 12 (twelve) hours.   sevelamer carbonate 800 MG tablet Commonly known as:   RENVELA Take 3 tablets (2,400 mg total) by mouth 3 (three) times daily with meals.      Follow-up Information    Le, Thao P, DO. Go on 09/03/2017.   Specialty:  Family Medicine Why:  @2 :45pm Contact information: [place] [address] [facility].   Why:  They will continue to do your home health care at your home Contact information: [address] [place] [address] tele # [phone]       Garvin Fila, MD. Go on 09/25/2017.   Specialties:  Neurology, Radiology Why:  at 10:30 AM Contact information: [place]  [address] [phone]          Allergies  Allergen Reactions  . Dilaudid [Hydromorphone Hcl] Other (See Comments)    ABNORMAL BEHAVIOR, "VERBALLY AND PHYSICALLY ABUSIVE," PSYCHOSIS  . Morphine And Related Itching  . Adhesive [Tape] Other (See Comments)    Redness from adhesive tape if left on too long, paper tape is preferred    Consultations:  Nephrology  Neurology   Procedures/Studies: Ct Head Wo Contrast  Result Date: 08/26/2017 CLINICAL DATA:  47 year old male with altered mental status. EXAM: CT HEAD WITHOUT CONTRAST TECHNIQUE: Contiguous axial images were obtained from the base of the skull through the vertex without intravenous contrast. COMPARISON:  None. FINDINGS: Brain: The ventricles and sulci appropriate size for patient's age. The gray-white matter discrimination is preserved. There is no acute intracranial hemorrhage. No mass effect or midline shift. No extra-axial fluid collection. Vascular: No hyperdense vessel or unexpected calcification. Skull: Normal. Negative for fracture or focal lesion. Sinuses/Orbits: A 2 cm partially visualized soft tissue lesion in the right maxillary sinus incompletely characterized, likely a retention cyst or polyp. There is mucoperiosteal thickening of the right maxillary sinus. No air-fluid level. The mastoid air cells  are clear. Other: None IMPRESSION: 1. No acute intracranial pathology by CT. 2. Mild mucoperiosteal thickening of the right maxillary sinus with a probable right maxillary retention cyst or polyp. Electronically Signed   By: Anner Crete M.D.   On: 08/26/2017 02:20   Mr Brain Wo Contrast  Result Date: 08/27/2017 CLINICAL DATA:  Acute presentation with confusion.  Previous stroke. EXAM: MRI HEAD WITHOUT CONTRAST TECHNIQUE: Multiplanar, multiecho pulse sequences of the brain and surrounding structures were obtained without intravenous contrast. COMPARISON:  CT scan done yesterday.  MRI 07/23/2017 FINDINGS: Brain: Diffusion imaging shows approximately 10 subcentimeter foci of acute infarctions scattered within the cerebellum and both cerebral hemispheres, more numerous in the right than the left, consistent with micro embolic infarctions. No large confluent cortical infarction. No mass lesion, acute hemorrhage, hydrocephalus or extra-axial collection. There chronic small-vessel ischemic changes of the pons. There are old small vessel cerebellar infarctions. There are moderate chronic small-vessel ischemic changes of the cerebral hemispheric white matter. There are foci of hemosiderin deposition within the deep brain consistent with micro hemorrhagic nature of many of the old small vessel insults. Vascular: Major vessels at the base of the brain show flow. Skull and upper cervical spine: Negative Sinuses/Orbits: Mucosal inflammatory changes of the right maxillary sinus. Orbits negative. Other: None IMPRESSION: Approximately 10 subcentimeter foci of acute infarction scattered throughout the brain, most numerous in the right cerebral hemisphere, consistent with micro embolic infarctions from the heart or ascending aorta. No large confluent infarction. Extensive old ischemic changes throughout the brain as outlined above. Electronically Signed   By: Nelson Chimes M.D.   On: 08/27/2017 15:36   Ct Coronary Morph  W/cta Cor W/score W/ca W/cm &/or Wo/cm  Addendum Date: 07/31/2017   ADDENDUM REPORT: 07/31/2017 16:23 ADDENDUM: Please see separate dictation for contemporaneously obtained CTA chest, abdomen and pelvis 07/31/2017 for full description of relevant extracardiac findings. Electronically Signed   By: Vinnie Langton M.D.   On: 07/31/2017 16:23   Result Date: 07/31/2017 CLINICAL DATA:  Aortic Stenosis EXAM: Cardiac TAVR CT TECHNIQUE: The patient was scanned on a Siemens Force 010 slice scanner. A 120 kV retrospective scan was triggered in the ascending thoracic aorta at 140 HU's. Gantry rotation speed was 250 msecs and collimation was .6 mm. No beta blockade or nitro were given.  The 3D data set was reconstructed in 5% intervals of the R-R cycle. Systolic and diastolic phases were analyzed on a dedicated work station using MPR, MIP and VRT modes. The patient received 80 cc of contrast. FINDINGS: Aortic Valve: Calcified tri-leaflet with restricted motion Aorta: Marked calcification of the arch and descending thoracic aorta Sino-tubular Junction: 31 mm Ascending Thoracic Aorta: 36 mm Aortic Arch: 30 mm Descending Thoracic Aorta: 29 mm Sinus of Valsalva Measurements: Non-coronary: 34.5 mm Right - coronary: 32.9 mm Left -   coronary: 32.7 mm Coronary Artery Height above Annulus: Left Main: 12.7 mm above annulus Right Coronary: 15 mm above annulus Virtual Basal Annulus Measurements: Maximum / Minimum Diameter: 24.2 mm x 31 mm Perimeter: 89 mm Area: 613 mm2 Coronary Arteries: Sufficient height above annulus for deployment Optimum Fluoroscopic Angle for Delivery: LAO 13 degrees Caudal 15 degrees IMPRESSION: 1. Calcified Tri-leaflet AV with annular area of 613 mm2 suitable for a 29 mm Sapien 3 valve 2. Marked calcific atherosclerosis of the aortic arch and descending thoracic aorta 3. Optimum angiographic angle for deployment LAO 13 degrees Caudal 15 degrees 4.  Coronary arteries sufficient height above annulus for  deployment Overall poor quality study for measurements due to poor opacification Jenkins Rouge Electronically Signed: By: Jenkins Rouge M.D. On: 07/31/2017 15:51   Ct Angio Chest Aorta W/cm &/or Wo/cm  Result Date: 07/31/2017 CLINICAL DATA:  47 year old male with history of severe aortic stenosis. Preprocedural study prior to potential transcatheter aortic valve replacement (TAVR) procedure. EXAM: CT ANGIOGRAPHY CHEST, ABDOMEN AND PELVIS TECHNIQUE: Multidetector CT imaging through the chest, abdomen and pelvis was performed using the standard protocol during bolus administration of intravenous contrast. Multiplanar reconstructed images and MIPs were obtained and reviewed to evaluate the vascular anatomy. CONTRAST:  17mL ISOVUE-370 IOPAMIDOL (ISOVUE-370) INJECTION 76% COMPARISON:  CT abdomen and pelvis 02/07/2017.  Chest CT 11/15/2015. FINDINGS: CTA CHEST FINDINGS Cardiovascular: Heart size is enlarged with left ventricular hypertrophy. There is no significant pericardial fluid, thickening or pericardial calcification. There is aortic atherosclerosis, as well as atherosclerosis of the great vessels of the mediastinum and the coronary arteries, including calcified atherosclerotic plaque in the left main, left anterior descending, left circumflex and right coronary arteries. Severe calcifications of the aortic valve and mitral annulus right-sided pacemaker device with lead tips terminating in the right atrium and right ventricular apex. Mediastinum/Lymph Nodes: Multiple prominent borderline enlarged mediastinal and hilar lymph nodes are noted, but are nonspecific. Esophagus is unremarkable in appearance. No axillary lymphadenopathy. Lungs/Pleura: Mild diffuse ground-glass attenuation and mild interlobular septal thickening, suggestive of a background of mild interstitial pulmonary edema. Trace right pleural effusion. No left pleural effusion. No consolidative airspace disease. No suspicious appearing pulmonary  nodules or masses. Musculoskeletal/Soft Tissues: There are no aggressive appearing lytic or blastic lesions noted in the visualized portions of the skeleton. CTA ABDOMEN AND PELVIS FINDINGS Hepatobiliary: Liver has a slightly shrunken appearance and nodular contour, suggesting underlying cirrhosis. No definite suspicious appearing cystic or solid hepatic lesions. No intra or extrahepatic biliary ductal dilatation. Status post cholecystectomy. Pancreas: No pancreatic mass. No pancreatic ductal dilatation. No pancreatic or peripancreatic fluid or inflammatory changes. Spleen: Unremarkable. Adrenals/Urinary Tract: 2.0 x 1.5 cm fatty attenuation lesion in the left adrenal gland, compatible with a small adrenal myelolipoma. Right adrenal gland is normal. Mild atrophy of both kidneys. No suspicious appearing renal lesions. No hydroureteronephrosis. Urinary bladder is unremarkable in appearance. Stomach/Bowel: Normal appearance of the stomach. No pathologic dilatation of small bowel or colon. Normal appendix. Vascular/Lymphatic:  Aortic atherosclerosis, with vascular findings and measurements pertinent to potential TAVR procedure, as detailed below. 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 Suboptimal contrast bolus on today's examination limits accurate assessment of other major abdominal vessels for potential hemodynamically significant stenosis, but there is considerable atherosclerotic disease in these vessels, particularly in the proximal celiac axis and superior mesenteric artery, both of which likely demonstrate at least moderate stenosis. No lymphadenopathy noted in the abdomen or pelvis. Reproductive: Prostate gland and seminal vesicles are unremarkable in appearance. Other: Trace volume of ascites.  No pneumoperitoneum. Musculoskeletal: There are no aggressive appearing lytic or blastic lesions noted in the visualized portions of the skeleton. VASCULAR MEASUREMENTS  PERTINENT TO TAVR: AORTA: Minimal Aortic Diameter-10 x 13 mm Severity of Aortic Calcification-severe RIGHT PELVIS: Right Common Iliac Artery - Minimal Diameter-10.6 x 9.1 mm Tortuosity-mild Calcification-moderate to severe Right External Iliac Artery - Minimal Diameter-9.6 x 7.9 mm Tortuosity-mild Calcification-moderate Right Common Femoral Artery - Minimal Diameter-7.9 x 6.2 mm Tortuosity-mild Calcification-moderate LEFT PELVIS: Left Common Iliac Artery - Minimal Diameter-9.3 x 7.6 mm Tortuosity-mild Calcification-moderate to severe Left External Iliac Artery - Minimal Diameter-9.6 x 10.2 mm Tortuosity-mild Calcification-moderate Left Common Femoral Artery - Minimal Diameter-10.3 x 7.9 mm Tortuosity-mild Calcification-moderate Review of the MIP images confirms the above findings. IMPRESSION: 1. Vascular findings and measurements pertinent to potential TAVR procedure, as detailed above. 2. Severe thickening calcification of the aortic valve, compatible with the reported clinical history of severe aortic stenosis. 3. 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 4. Cardiomegaly with left ventricular hypertrophy. 5. 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 6. Aortic atherosclerosis, in addition to left main and 3 vessel coronary artery disease. Please note that although the presence of coronary artery calcium documents the presence of coronary artery disease, the severity of this disease and any potential stenosis cannot be assessed on this non-gated CT examination. Assessment for potential risk factor modification, dietary therapy or pharmacologic therapy may be warranted, if clinically indicated. 7. Additional incidental findings, as above. Aortic Atherosclerosis (ICD10-I70.0). Electronically Signed   By: Vinnie Langton M.D.   On: 07/31/2017 16:59   Ct Angio Abd/pel W/ And/or W/o  Result Date: 07/31/2017 CLINICAL DATA:  47 year old male with history of severe aortic stenosis. Preprocedural study prior  to potential transcatheter aortic valve replacement (TAVR) procedure. EXAM: CT ANGIOGRAPHY CHEST, ABDOMEN AND PELVIS TECHNIQUE: Multidetector CT imaging through the chest, abdomen and pelvis was performed using the standard protocol during bolus administration of intravenous contrast. Multiplanar reconstructed images and MIPs were obtained and reviewed to evaluate the vascular anatomy. CONTRAST:  73mL ISOVUE-370 IOPAMIDOL (ISOVUE-370) INJECTION 76% COMPARISON:  CT abdomen and pelvis 02/07/2017.  Chest CT 11/15/2015. FINDINGS: CTA CHEST FINDINGS Cardiovascular: Heart size is enlarged with left ventricular hypertrophy. There is no significant pericardial fluid, thickening or pericardial calcification. There is aortic atherosclerosis, as well as atherosclerosis of the great vessels of the mediastinum and the coronary arteries, including calcified atherosclerotic plaque in the left main, left anterior descending, left circumflex and right coronary arteries. Severe calcifications of the aortic valve and mitral annulus right-sided pacemaker device with lead tips terminating in the right atrium and right ventricular apex. Mediastinum/Lymph Nodes: Multiple prominent borderline enlarged mediastinal and hilar lymph nodes are noted, but are nonspecific. Esophagus is unremarkable in appearance. No axillary lymphadenopathy. Lungs/Pleura: Mild diffuse ground-glass attenuation and mild interlobular septal thickening, suggestive of a background of mild interstitial pulmonary edema. Trace right pleural effusion. No left  pleural effusion. No consolidative airspace disease. No suspicious appearing pulmonary nodules or masses. Musculoskeletal/Soft Tissues: There are no aggressive appearing lytic or blastic lesions noted in the visualized portions of the skeleton. CTA ABDOMEN AND PELVIS FINDINGS Hepatobiliary: Liver has a slightly shrunken appearance and nodular contour, suggesting underlying cirrhosis. No definite suspicious appearing  cystic or solid hepatic lesions. No intra or extrahepatic biliary ductal dilatation. Status post cholecystectomy. Pancreas: No pancreatic mass. No pancreatic ductal dilatation. No pancreatic or peripancreatic fluid or inflammatory changes. Spleen: Unremarkable. Adrenals/Urinary Tract: 2.0 x 1.5 cm fatty attenuation lesion in the left adrenal gland, compatible with a small adrenal myelolipoma. Right adrenal gland is normal. Mild atrophy of both kidneys. No suspicious appearing renal lesions. No hydroureteronephrosis. Urinary bladder is unremarkable in appearance. Stomach/Bowel: Normal appearance of the stomach. No pathologic dilatation of small bowel or colon. Normal appendix. Vascular/Lymphatic: Aortic atherosclerosis, with vascular findings and measurements pertinent to potential TAVR procedure, as detailed below. 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 Suboptimal contrast bolus on today's examination limits accurate assessment of other major abdominal vessels for potential hemodynamically significant stenosis, but there is considerable atherosclerotic disease in these vessels, particularly in the proximal celiac axis and superior mesenteric artery, both of which likely demonstrate at least moderate stenosis. No lymphadenopathy noted in the abdomen or pelvis. Reproductive: Prostate gland and seminal vesicles are unremarkable in appearance. Other: Trace volume of ascites.  No pneumoperitoneum. Musculoskeletal: There are no aggressive appearing lytic or blastic lesions noted in the visualized portions of the skeleton. VASCULAR MEASUREMENTS PERTINENT TO TAVR: AORTA: Minimal Aortic Diameter-10 x 13 mm Severity of Aortic Calcification-severe RIGHT PELVIS: Right Common Iliac Artery - Minimal Diameter-10.6 x 9.1 mm Tortuosity-mild Calcification-moderate to severe Right External Iliac Artery - Minimal Diameter-9.6 x 7.9 mm Tortuosity-mild Calcification-moderate Right Common  Femoral Artery - Minimal Diameter-7.9 x 6.2 mm Tortuosity-mild Calcification-moderate LEFT PELVIS: Left Common Iliac Artery - Minimal Diameter-9.3 x 7.6 mm Tortuosity-mild Calcification-moderate to severe Left External Iliac Artery - Minimal Diameter-9.6 x 10.2 mm Tortuosity-mild Calcification-moderate Left Common Femoral Artery - Minimal Diameter-10.3 x 7.9 mm Tortuosity-mild Calcification-moderate Review of the MIP images confirms the above findings. IMPRESSION: 1. Vascular findings and measurements pertinent to potential TAVR procedure, as detailed above. 2. Severe thickening calcification of the aortic valve, compatible with the reported clinical history of severe aortic stenosis. 3. 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 4. Cardiomegaly with left ventricular hypertrophy. 5. 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 6. Aortic atherosclerosis, in addition to left main and 3 vessel coronary artery disease. Please note that although the presence of coronary artery calcium documents the presence of coronary artery disease, the severity of this disease and any potential stenosis cannot be assessed on this non-gated CT examination. Assessment for potential risk factor modification, dietary therapy or pharmacologic therapy may be warranted, if clinically indicated. 7. Additional incidental findings, as above. Aortic Atherosclerosis (ICD10-I70.0). Electronically Signed   By: Vinnie Langton M.D.   On: 07/31/2017 16:59       Subjective: Feels well, oriented.  No focal weakness, numbness, slurred speech.  No chest pain, dyspnea, confusion.  Leg pain in left leg, otherwise no change.  Discharge Exam: Vitals:   08/29/17 1257 08/29/17 1519  BP: (!) 166/77 (!) 146/78  Pulse: 76   Resp:    Temp: 98.8 F (37.1 C)   SpO2: 95%    Vitals:   08/29/17 1130 08/29/17 1137 08/29/17 1257 08/29/17 1519  BP: 132/75 136/76 (!) 166/77 (!) 146/78  Pulse: 65 70 76   Resp:  18    Temp:  97.6 F (36.4 C)  98.8 F (37.1 C)   TempSrc:  Oral Oral   SpO2:  94% 95%   Weight:  89.4 kg (197 lb)    Height:        General: Pt is alert, awake, not in acute distress, interactive Cardiovascular: Systolic murmur, L3/T3 +, no rubs, no gallops Respiratory: CTA bilaterally, no wheezing, no rhonchi Abdominal: Soft, NT, ND, bowel sounds + Extremities: no edema, no cyanosis, right BKA, left eschar/calciphylaxis    The results of significant diagnostics from this hospitalization (including imaging, microbiology, ancillary and laboratory) are listed below for reference.     Microbiology: Recent Results (from the past 240 hour(s))  MRSA PCR Screening     Status: None   Collection Time: 08/26/17 11:11 PM  Result Value Ref Range Status   MRSA by PCR NEGATIVE NEGATIVE Final    Comment:        The GeneXpert MRSA Assay (FDA approved for NASAL specimens only), is one component of a comprehensive MRSA colonization surveillance program. It is not intended to diagnose MRSA infection nor to guide or monitor treatment for MRSA infections. Performed at [facility], [place] [address] [place], [place] [address]   Culture, blood (routine x 2)     Status: None (Preliminary result)   Collection Time: 08/27/17 11:00 AM  Result Value Ref Range Status   Specimen Description BLOOD RIGHT ANTECUBITAL  Final   Special Requests   Final    BOTTLES DRAWN AEROBIC ONLY Blood Culture adequate volume   Culture   Final    NO GROWTH 2 DAYS Performed at [facility], [place] [address] [place], [place] [address]    Report Status PENDING  Incomplete  Culture, blood (routine x 2)     Status: None (Preliminary result)   Collection Time: 08/27/17 11:05 AM  Result Value Ref Range Status   Specimen Description BLOOD RIGHT ANTECUBITAL  Final   Special Requests   Final    BOTTLES DRAWN AEROBIC ONLY Blood Culture adequate volume   Culture   Final    NO GROWTH 2 DAYS Performed at [facility], [place] [address] [place], [place] [address]    Report Status PENDING  Incomplete  Culture, blood (single)     Status: None (Preliminary result)   Collection Time: 08/27/17 12:33 PM  Result Value Ref Range Status   Specimen Description BLOOD RIGHT ANTECUBITAL  Final   Special Requests IN PEDIATRIC BOTTLE Blood Culture adequate volume  Final   Culture   Final    NO GROWTH 2 DAYS Performed at [facility], [place] [address] [place], [place] [address]    Report Status PENDING  Incomplete  Urine Culture     Status: None   Collection Time: 08/27/17 11:47 PM  Result Value Ref Range Status   Specimen Description URINE, CLEAN CATCH  Final   Special Requests NONE  Final   Culture   Final    NO GROWTH Performed at [facility], [place] [address] [place], [place] [address]    Report Status 08/29/2017 FINAL  Final     Labs: BNP (last 3 results) No results for input(s): BNP in the last 8760 hours. Basic Metabolic Panel: Recent Labs  Lab 08/25/17 1845 08/27/17 1809 08/28/17 0417 08/29/17 0700  NA 137 136 139 138  K 3.7 3.8 4.0 3.9  CL 95* 97* 99* 98*  CO2 28 26 26 26   GLUCOSE 80 96 102* 97  BUN 23* 30* 18 33*  CREATININE 4.69* 5.81* 4.55* 6.32*  CALCIUM 8.9 9.0 8.9 9.1  PHOS  --  3.1  --  3.9   Liver Function Tests: Recent Labs  Lab 08/25/17 1845 08/27/17 1809 08/29/17 0700  AST 38  --   --   ALT 32  --   --   ALKPHOS 389*  --   --   BILITOT 0.9  --   --   PROT 7.1  --   --   ALBUMIN 2.8* 2.6* 2.4*   No results for input(s): LIPASE, AMYLASE in the last 168 hours. Recent Labs  Lab 08/26/17 0140  AMMONIA 33   CBC: Recent Labs  Lab 08/25/17 1845 08/26/17 0445 08/27/17 1811 08/28/17 0417 08/29/17 0700  WBC 11.8*  --  9.4 11.3* 11.2*  HGB 12.1*  --  11.6* 12.1* 12.1*  HCT 38.5* 37.2* 37.5* 39.0 39.6  MCV 89.7  --  90.1 91.3 92.3  PLT 329  --  290 284 301   Cardiac Enzymes: Recent Labs  Lab 08/26/17 0140 08/26/17 0445 08/26/17 1122 08/27/17 0022 08/27/17 0651   TROPONINI 0.08* 0.09* 0.10* 0.07* 0.07*   BNP: Invalid input(s): POCBNP CBG: Recent Labs  Lab 08/26/17 0529  GLUCAP 85   D-Dimer No results for input(s): DDIMER in the last 72 hours. Hgb A1c No results for input(s): HGBA1C in the last 72 hours. Lipid Profile No results for input(s): CHOL, HDL, LDLCALC, TRIG, CHOLHDL, LDLDIRECT in the last 72 hours. Thyroid function studies No results for input(s): TSH, T4TOTAL, T3FREE, THYROIDAB in the last 72 hours.  Invalid input(s): FREET3 Anemia work up No results for input(s): VITAMINB12, FOLATE, FERRITIN, TIBC, IRON, RETICCTPCT in the last 72 hours. Urinalysis    Component Value Date/Time   COLORURINE YELLOW 08/28/2017 0109   APPEARANCEUR CLEAR 08/28/2017 0109   LABSPEC 1.015 08/28/2017 0109   PHURINE 7.0 08/28/2017 0109   GLUCOSEU 50 (A) 08/28/2017 0109   HGBUR NEGATIVE 08/28/2017 0109   BILIRUBINUR NEGATIVE 08/28/2017 0109   KETONESUR NEGATIVE 08/28/2017 0109   PROTEINUR 100 (A) 08/28/2017 0109   UROBILINOGEN 0.2 09/22/2012 1805   NITRITE NEGATIVE 08/28/2017 0109   LEUKOCYTESUR NEGATIVE 08/28/2017 0109   Sepsis Labs Invalid input(s): PROCALCITONIN,  WBC,  LACTICIDVEN Microbiology Recent Results (from the past 240 hour(s))  MRSA PCR Screening     Status: None   Collection Time: 08/26/17 11:11 PM  Result Value Ref Range Status   MRSA by PCR NEGATIVE NEGATIVE Final    Comment:        The GeneXpert MRSA Assay (FDA approved for NASAL specimens only), is one component of a comprehensive MRSA colonization surveillance program. It is not intended to diagnose MRSA infection nor to guide or monitor treatment for MRSA infections. Performed at [facility], [place] [address], [place], [place] [address]   Culture, blood (routine x 2)     Status: None (Preliminary result)   Collection Time: 08/27/17 11:00 AM  Result Value Ref Range Status   Specimen Description BLOOD RIGHT ANTECUBITAL  Final   Special Requests   Final     BOTTLES DRAWN AEROBIC ONLY Blood Culture adequate volume   Culture   Final    NO GROWTH 2 DAYS Performed at [facility], [place] [address], [place], [place] [address]    Report Status PENDING  Incomplete  Culture, blood (routine x 2)     Status: None (Preliminary result)   Collection Time: 08/27/17 11:05 AM  Result Value Ref Range Status   Specimen Description BLOOD RIGHT ANTECUBITAL  Final   Special Requests   Final    BOTTLES DRAWN AEROBIC ONLY Blood Culture adequate volume   Culture   Final    NO GROWTH 2 DAYS Performed at [facility], [address], [place], [place] [address]    Report Status PENDING  Incomplete  Culture, blood (single)     Status: None (Preliminary result)   Collection Time: 08/27/17 12:33 PM  Result Value Ref Range Status   Specimen Description BLOOD RIGHT ANTECUBITAL  Final   Special Requests IN PEDIATRIC BOTTLE Blood Culture adequate volume  Final   Culture   Final    NO GROWTH 2 DAYS Performed at [facility], [place] [address] [place], [place] [address]    Report Status PENDING  Incomplete  Urine Culture     Status: None   Collection Time: 08/27/17 11:47 PM  Result Value Ref Range Status   Specimen Description URINE, CLEAN CATCH  Final   Special Requests NONE  Final   Culture   Final    NO GROWTH Performed at [facility], [place] [address], [place], [place] [address]    Report Status 08/29/2017 FINAL  Final     Time coordinating discharge: Over 30 minutes  SIGNED:   Edwin Dada, MD  [name] 08/29/2017, 5:57 PM

## 2017-08-29 NOTE — Progress Notes (Signed)
Patient ID: Hayden [name], male   DOB: 08/29/1970, 47 y.o.   MRN: [id number]  [facility] Progress Note   Assessment/ Plan:   1. Acute encephalopathy/altered mental status: Initially suspected to be associated with narcotic analgesics and MRI of the brain showing bilateral diffuse acute infarcts highly suggestive of an embolic pattern-ongoing workup by neurology with CT angiogram of the head and neck planned.  Echocardiogram report pending and started on risk factor modification. 2. ESRD: Will continue hemodialysis on his usual schedule of Monday/Wednesday/Friday schedule with hemodialysis at this time and efforts to avoid episodes of intradialytic hypotension to limit penumbra extension. 3. Anemia: Hemoglobin within acceptable range, continue to monitor for overt losses/PSA needs. 4. CKD-MBD: With recent calciphylaxis/leg wounds prompting need for avoidance of calcium containing binder/calcitriol and use of low calcium dialysate. 5. Nutrition: Continue nutritional supplementation/increase protein intake for wound healing 6. Hypertension: Blood pressure marginally elevated, continue to monitor on antihypertensive therapy/ultrafiltration with hemodialysis 7. Bipolar disorder 8. Severe stenosis, coronary artery disease, ischemic cardiomyopathy-not a surgical candidate  Subjective:   Reports to be feeling well-"I do not think I am dying".   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 Left RCF cannulated.   Labs: BMET Recent Labs   Lab 08/25/17 1845 08/27/17 1809 08/28/17 0417 08/29/17 0700  NA 137 136 139 138  K 3.7 3.8 4.0 3.9  CL 95* 97* 99* 98*  CO2 28 26 26 26   GLUCOSE 80 96 102* 97  BUN 23* 30* 18 33*  CREATININE 4.69* 5.81* 4.55* 6.32*  CALCIUM 8.9 9.0 8.9 9.1  PHOS  --  3.1  --  3.9   CBC Recent Labs  Lab 08/25/17 1845 08/26/17 0445 08/27/17 1811 08/28/17 0417 08/29/17 0700  WBC 11.8*  --  9.4 11.3* 11.2*  HGB 12.1*  --  11.6* 12.1* 12.1*  HCT 38.5* 37.2* 37.5* 39.0 39.6  MCV 89.7  --  90.1 91.3 92.3  PLT 329  --  290 284 301   Medications:    . amLODipine  10 mg Oral Daily  . apixaban  5 mg Oral BID  . aspirin  81 mg Oral Daily  . atorvastatin  80 mg Oral q1800  . carvedilol  25 mg Oral BID WC  . cinacalcet  90 mg Oral Q breakfast  . cloNIDine  0.2 mg Transdermal Q Tue  . collagenase   Topical Daily  . divalproex  500 mg Oral Q12H  . escitalopram  10 mg Oral Daily  . ferric citrate  420 mg Oral TID WC  . gabapentin  100 mg Oral BID  . hydrALAZINE  100 mg Oral Q8H  . irbesartan  300 mg Oral Daily  . isosorbide dinitrate  10 mg Oral BID  . mouth rinse  15 mL Mouth Rinse BID  . multivitamin  1 tablet Oral QHS  . nicotine  14 mg Transdermal Daily  . oxyCODONE  10 mg Oral Q12H  . pantoprazole  40 mg Oral Daily  . sevelamer carbonate  2,400 mg Oral TID WC  . sodium chloride flush  3 mL Intravenous Q12H   Elmarie Shiley, MD 08/29/2017, 9:27 AM

## 2017-09-01 LAB — CULTURE, BLOOD (ROUTINE X 2)
CULTURE: NO GROWTH
Culture: NO GROWTH
SPECIAL REQUESTS: ADEQUATE
Special Requests: ADEQUATE

## 2017-09-01 LAB — CULTURE, BLOOD (SINGLE)
CULTURE: NO GROWTH
SPECIAL REQUESTS: ADEQUATE

## 2017-09-18 ENCOUNTER — Encounter: Payer: Self-pay | Admitting: Thoracic Surgery (Cardiothoracic Vascular Surgery)

## 2017-09-23 ENCOUNTER — Ambulatory Visit: Payer: Self-pay | Admitting: Physical Medicine & Rehabilitation

## 2017-09-25 ENCOUNTER — Encounter: Payer: Self-pay | Admitting: Neurology

## 2017-09-25 ENCOUNTER — Telehealth: Payer: Self-pay

## 2017-09-25 ENCOUNTER — Ambulatory Visit: Payer: Medicare Other | Admitting: Neurology

## 2017-09-25 NOTE — Telephone Encounter (Signed)
Patient no show for appt today. 

## 2017-09-30 ENCOUNTER — Encounter: Payer: Self-pay | Admitting: Cardiology

## 2017-12-19 ENCOUNTER — Other Ambulatory Visit: Payer: Self-pay

## 2017-12-19 DIAGNOSIS — F17210 Nicotine dependence, cigarettes, uncomplicated: Secondary | ICD-10-CM | POA: Diagnosis present

## 2017-12-19 DIAGNOSIS — E8779 Other fluid overload: Secondary | ICD-10-CM | POA: Diagnosis not present

## 2017-12-19 DIAGNOSIS — Z85528 Personal history of other malignant neoplasm of kidney: Secondary | ICD-10-CM

## 2017-12-19 DIAGNOSIS — Z7982 Long term (current) use of aspirin: Secondary | ICD-10-CM

## 2017-12-19 DIAGNOSIS — I4891 Unspecified atrial fibrillation: Secondary | ICD-10-CM | POA: Diagnosis present

## 2017-12-19 DIAGNOSIS — Z7901 Long term (current) use of anticoagulants: Secondary | ICD-10-CM

## 2017-12-19 DIAGNOSIS — N186 End stage renal disease: Secondary | ICD-10-CM | POA: Diagnosis present

## 2017-12-19 DIAGNOSIS — F319 Bipolar disorder, unspecified: Secondary | ICD-10-CM | POA: Diagnosis present

## 2017-12-19 DIAGNOSIS — D649 Anemia, unspecified: Secondary | ICD-10-CM | POA: Diagnosis present

## 2017-12-19 DIAGNOSIS — R0902 Hypoxemia: Secondary | ICD-10-CM | POA: Diagnosis not present

## 2017-12-19 DIAGNOSIS — I132 Hypertensive heart and chronic kidney disease with heart failure and with stage 5 chronic kidney disease, or end stage renal disease: Secondary | ICD-10-CM | POA: Diagnosis present

## 2017-12-19 DIAGNOSIS — I2510 Atherosclerotic heart disease of native coronary artery without angina pectoris: Secondary | ICD-10-CM | POA: Diagnosis present

## 2017-12-19 DIAGNOSIS — E1151 Type 2 diabetes mellitus with diabetic peripheral angiopathy without gangrene: Secondary | ICD-10-CM | POA: Diagnosis present

## 2017-12-19 DIAGNOSIS — E11622 Type 2 diabetes mellitus with other skin ulcer: Secondary | ICD-10-CM | POA: Diagnosis present

## 2017-12-19 DIAGNOSIS — Z89611 Acquired absence of right leg above knee: Secondary | ICD-10-CM

## 2017-12-19 DIAGNOSIS — Z950 Presence of cardiac pacemaker: Secondary | ICD-10-CM

## 2017-12-19 DIAGNOSIS — Z833 Family history of diabetes mellitus: Secondary | ICD-10-CM

## 2017-12-19 DIAGNOSIS — I5042 Chronic combined systolic (congestive) and diastolic (congestive) heart failure: Secondary | ICD-10-CM | POA: Diagnosis present

## 2017-12-19 DIAGNOSIS — Z9114 Patient's other noncompliance with medication regimen: Secondary | ICD-10-CM

## 2017-12-19 DIAGNOSIS — L97929 Non-pressure chronic ulcer of unspecified part of left lower leg with unspecified severity: Secondary | ICD-10-CM | POA: Diagnosis present

## 2017-12-19 DIAGNOSIS — E1122 Type 2 diabetes mellitus with diabetic chronic kidney disease: Secondary | ICD-10-CM | POA: Diagnosis present

## 2017-12-19 DIAGNOSIS — E669 Obesity, unspecified: Secondary | ICD-10-CM | POA: Diagnosis present

## 2017-12-19 DIAGNOSIS — I4892 Unspecified atrial flutter: Secondary | ICD-10-CM | POA: Diagnosis present

## 2017-12-19 DIAGNOSIS — I9589 Other hypotension: Secondary | ICD-10-CM | POA: Diagnosis present

## 2017-12-19 DIAGNOSIS — Z992 Dependence on renal dialysis: Secondary | ICD-10-CM

## 2017-12-19 DIAGNOSIS — D631 Anemia in chronic kidney disease: Secondary | ICD-10-CM | POA: Diagnosis present

## 2017-12-19 DIAGNOSIS — J9601 Acute respiratory failure with hypoxia: Secondary | ICD-10-CM | POA: Diagnosis present

## 2017-12-19 DIAGNOSIS — Z8249 Family history of ischemic heart disease and other diseases of the circulatory system: Secondary | ICD-10-CM

## 2017-12-19 DIAGNOSIS — E785 Hyperlipidemia, unspecified: Secondary | ICD-10-CM | POA: Diagnosis present

## 2017-12-19 DIAGNOSIS — N2581 Secondary hyperparathyroidism of renal origin: Secondary | ICD-10-CM | POA: Diagnosis present

## 2017-12-19 DIAGNOSIS — Z905 Acquired absence of kidney: Secondary | ICD-10-CM

## 2017-12-19 DIAGNOSIS — Z9111 Patient's noncompliance with dietary regimen: Secondary | ICD-10-CM

## 2017-12-19 DIAGNOSIS — Z6831 Body mass index (BMI) 31.0-31.9, adult: Secondary | ICD-10-CM

## 2017-12-19 DIAGNOSIS — Z8673 Personal history of transient ischemic attack (TIA), and cerebral infarction without residual deficits: Secondary | ICD-10-CM

## 2017-12-19 DIAGNOSIS — I080 Rheumatic disorders of both mitral and aortic valves: Secondary | ICD-10-CM | POA: Diagnosis present

## 2017-12-20 ENCOUNTER — Other Ambulatory Visit: Payer: Self-pay

## 2017-12-20 ENCOUNTER — Emergency Department (HOSPITAL_COMMUNITY): Payer: Medicare Other

## 2017-12-20 ENCOUNTER — Inpatient Hospital Stay (HOSPITAL_COMMUNITY)
Admission: EM | Admit: 2017-12-20 | Discharge: 2017-12-23 | DRG: 640 | Disposition: A | Discharge status: Home / Self Care | Payer: Medicare Other | Attending: Family Medicine | Admitting: Family Medicine

## 2017-12-20 ENCOUNTER — Encounter (HOSPITAL_COMMUNITY): Payer: Self-pay | Admitting: Emergency Medicine

## 2017-12-20 DIAGNOSIS — R0602 Shortness of breath: Secondary | ICD-10-CM

## 2017-12-20 DIAGNOSIS — E1151 Type 2 diabetes mellitus with diabetic peripheral angiopathy without gangrene: Secondary | ICD-10-CM | POA: Diagnosis present

## 2017-12-20 DIAGNOSIS — E785 Hyperlipidemia, unspecified: Secondary | ICD-10-CM | POA: Diagnosis present

## 2017-12-20 DIAGNOSIS — I10 Essential (primary) hypertension: Secondary | ICD-10-CM | POA: Diagnosis present

## 2017-12-20 DIAGNOSIS — D638 Anemia in other chronic diseases classified elsewhere: Secondary | ICD-10-CM | POA: Diagnosis present

## 2017-12-20 DIAGNOSIS — E8779 Other fluid overload: Secondary | ICD-10-CM | POA: Diagnosis present

## 2017-12-20 DIAGNOSIS — D631 Anemia in chronic kidney disease: Secondary | ICD-10-CM | POA: Diagnosis present

## 2017-12-20 DIAGNOSIS — Z9111 Patient's noncompliance with dietary regimen: Secondary | ICD-10-CM | POA: Diagnosis not present

## 2017-12-20 DIAGNOSIS — R0902 Hypoxemia: Secondary | ICD-10-CM | POA: Diagnosis present

## 2017-12-20 DIAGNOSIS — I132 Hypertensive heart and chronic kidney disease with heart failure and with stage 5 chronic kidney disease, or end stage renal disease: Secondary | ICD-10-CM | POA: Diagnosis present

## 2017-12-20 DIAGNOSIS — N186 End stage renal disease: Secondary | ICD-10-CM

## 2017-12-20 DIAGNOSIS — I5042 Chronic combined systolic (congestive) and diastolic (congestive) heart failure: Secondary | ICD-10-CM

## 2017-12-20 DIAGNOSIS — J9601 Acute respiratory failure with hypoxia: Secondary | ICD-10-CM | POA: Diagnosis present

## 2017-12-20 DIAGNOSIS — Z8673 Personal history of transient ischemic attack (TIA), and cerebral infarction without residual deficits: Secondary | ICD-10-CM

## 2017-12-20 DIAGNOSIS — E11622 Type 2 diabetes mellitus with other skin ulcer: Secondary | ICD-10-CM | POA: Diagnosis present

## 2017-12-20 DIAGNOSIS — I080 Rheumatic disorders of both mitral and aortic valves: Secondary | ICD-10-CM | POA: Diagnosis present

## 2017-12-20 DIAGNOSIS — N2581 Secondary hyperparathyroidism of renal origin: Secondary | ICD-10-CM | POA: Diagnosis present

## 2017-12-20 DIAGNOSIS — Z89611 Acquired absence of right leg above knee: Secondary | ICD-10-CM | POA: Diagnosis not present

## 2017-12-20 DIAGNOSIS — Z9114 Patient's other noncompliance with medication regimen: Secondary | ICD-10-CM | POA: Diagnosis not present

## 2017-12-20 DIAGNOSIS — L97929 Non-pressure chronic ulcer of unspecified part of left lower leg with unspecified severity: Secondary | ICD-10-CM | POA: Diagnosis present

## 2017-12-20 DIAGNOSIS — F17210 Nicotine dependence, cigarettes, uncomplicated: Secondary | ICD-10-CM | POA: Diagnosis present

## 2017-12-20 DIAGNOSIS — I350 Nonrheumatic aortic (valve) stenosis: Secondary | ICD-10-CM

## 2017-12-20 DIAGNOSIS — Z992 Dependence on renal dialysis: Secondary | ICD-10-CM | POA: Diagnosis not present

## 2017-12-20 DIAGNOSIS — Z905 Acquired absence of kidney: Secondary | ICD-10-CM | POA: Diagnosis not present

## 2017-12-20 DIAGNOSIS — E1122 Type 2 diabetes mellitus with diabetic chronic kidney disease: Secondary | ICD-10-CM | POA: Diagnosis present

## 2017-12-20 DIAGNOSIS — S78111A Complete traumatic amputation at level between right hip and knee, initial encounter: Secondary | ICD-10-CM

## 2017-12-20 DIAGNOSIS — I4892 Unspecified atrial flutter: Secondary | ICD-10-CM | POA: Diagnosis present

## 2017-12-20 DIAGNOSIS — I509 Heart failure, unspecified: Secondary | ICD-10-CM

## 2017-12-20 DIAGNOSIS — I2510 Atherosclerotic heart disease of native coronary artery without angina pectoris: Secondary | ICD-10-CM | POA: Diagnosis present

## 2017-12-20 DIAGNOSIS — I9589 Other hypotension: Secondary | ICD-10-CM | POA: Diagnosis present

## 2017-12-20 DIAGNOSIS — I4891 Unspecified atrial fibrillation: Secondary | ICD-10-CM | POA: Diagnosis present

## 2017-12-20 LAB — CBC
HEMATOCRIT: 32.5 % — AB (ref 39.0–52.0)
HEMOGLOBIN: 10.1 g/dL — AB (ref 13.0–17.0)
MCH: 29.9 pg (ref 26.0–34.0)
MCHC: 31.1 g/dL (ref 30.0–36.0)
MCV: 96.2 fL (ref 78.0–100.0)
Platelets: 241 10*3/uL (ref 150–400)
RBC: 3.38 MIL/uL — ABNORMAL LOW (ref 4.22–5.81)
RDW: 15.9 % — ABNORMAL HIGH (ref 11.5–15.5)
WBC: 7.4 10*3/uL (ref 4.0–10.5)

## 2017-12-20 LAB — LACTIC ACID, PLASMA
LACTIC ACID, VENOUS: 0.8 mmol/L (ref 0.5–1.9)
Lactic Acid, Venous: 0.9 mmol/L (ref 0.5–1.9)

## 2017-12-20 LAB — I-STAT TROPONIN, ED: Troponin i, poc: 0.62 ng/mL (ref 0.00–0.08)

## 2017-12-20 LAB — PROCALCITONIN: PROCALCITONIN: 0.32 ng/mL

## 2017-12-20 LAB — MRSA PCR SCREENING: MRSA by PCR: NEGATIVE

## 2017-12-20 LAB — BASIC METABOLIC PANEL
ANION GAP: 12 (ref 5–15)
BUN: 31 mg/dL — ABNORMAL HIGH (ref 6–20)
CHLORIDE: 98 mmol/L — AB (ref 101–111)
CO2: 30 mmol/L (ref 22–32)
Calcium: 7.5 mg/dL — ABNORMAL LOW (ref 8.9–10.3)
Creatinine, Ser: 4.62 mg/dL — ABNORMAL HIGH (ref 0.61–1.24)
GFR calc Af Amer: 16 mL/min — ABNORMAL LOW (ref >60)
GFR calc non Af Amer: 14 mL/min — ABNORMAL LOW (ref >60)
GLUCOSE: 102 mg/dL — AB (ref 65–99)
Potassium: 4.2 mmol/L (ref 3.5–5.1)
Sodium: 140 mmol/L (ref 135–145)

## 2017-12-20 LAB — PROTIME-INR
INR: 1.39
Prothrombin Time: 17 seconds — ABNORMAL HIGH (ref 11.4–15.2)

## 2017-12-20 LAB — HEPATITIS B SURFACE ANTIGEN: HEP B S AG: NEGATIVE

## 2017-12-20 LAB — TROPONIN I
TROPONIN I: 1.01 ng/mL — AB (ref <0.03)
TROPONIN I: 0.74 ng/mL — AB (ref <0.03)
Troponin I: 0.61 ng/mL (ref <0.03)

## 2017-12-20 LAB — I-STAT CG4 LACTIC ACID, ED: Lactic Acid, Venous: 0.84 mmol/L (ref 0.5–1.9)

## 2017-12-20 MED ORDER — CHLORHEXIDINE GLUCONATE CLOTH 2 % EX PADS
6.0000 | MEDICATED_PAD | Freq: Every day | CUTANEOUS | Status: DC
  Administered 2017-12-20: 6 via TOPICAL

## 2017-12-20 MED ORDER — PENTAFLUOROPROP-TETRAFLUOROETH EX AERO: 1.0000 "application " | INHALATION_SPRAY | CUTANEOUS | Status: DC | PRN

## 2017-12-20 MED ORDER — PIPERACILLIN-TAZOBACTAM 3.375 G IVPB
3.3750 g | Freq: Two times a day (BID) | INTRAVENOUS | Status: DC
  Administered 2017-12-20 – 2017-12-23 (×7): 3.375 g via INTRAVENOUS
  Filled 2017-12-20 (×8): qty 50

## 2017-12-20 MED ORDER — VANCOMYCIN HCL IN DEXTROSE 750-5 MG/150ML-% IV SOLN
750.0000 mg | INTRAVENOUS | Status: DC
  Filled 2017-12-20: qty 150

## 2017-12-20 MED ORDER — FERRIC CITRATE 1 GM 210 MG(FE) PO TABS
840.0000 mg | ORAL_TABLET | Freq: Three times a day (TID) | ORAL | Status: DC
Start: 2017-12-20 — End: 2017-12-23
  Administered 2017-12-20 – 2017-12-21 (×5): 840 mg via ORAL
  Filled 2017-12-20 (×12): qty 4

## 2017-12-20 MED ORDER — ALBUMIN HUMAN 25 % IV SOLN
INTRAVENOUS | Status: AC
  Filled 2017-12-20: qty 100

## 2017-12-20 MED ORDER — FERRIC CITRATE 1 GM 210 MG(FE) PO TABS: 420.0000 mg | ORAL_TABLET | ORAL | Status: DC

## 2017-12-20 MED ORDER — OXYCODONE HCL 5 MG PO TABS
10.0000 mg | ORAL_TABLET | ORAL | Status: DC | PRN
  Administered 2017-12-20 – 2017-12-22 (×9): 10 mg via ORAL
  Filled 2017-12-20 (×10): qty 2

## 2017-12-20 MED ORDER — CLONIDINE HCL 0.2 MG/24HR TD PTWK
0.2000 mg | MEDICATED_PATCH | TRANSDERMAL | Status: DC
  Filled 2017-12-20: qty 1

## 2017-12-20 MED ORDER — PANTOPRAZOLE SODIUM 40 MG PO TBEC
40.0000 mg | DELAYED_RELEASE_TABLET | Freq: Every day | ORAL | Status: DC
  Administered 2017-12-20 – 2017-12-21 (×2): 40 mg via ORAL
  Filled 2017-12-20 (×3): qty 1

## 2017-12-20 MED ORDER — OXYCODONE-ACETAMINOPHEN 5-325 MG PO TABS
1.0000 | ORAL_TABLET | Freq: Once | ORAL | Status: AC
  Administered 2017-12-20: 1 via ORAL
  Filled 2017-12-20: qty 1

## 2017-12-20 MED ORDER — FERRIC CITRATE 1 GM 210 MG(FE) PO TABS
420.0000 mg | ORAL_TABLET | Freq: Two times a day (BID) | ORAL | Status: DC | PRN
  Filled 2017-12-20: qty 2

## 2017-12-20 MED ORDER — HYOSCYAMINE SULFATE 0.125 MG SL SUBL
0.2500 mg | SUBLINGUAL_TABLET | Freq: Once | SUBLINGUAL | Status: AC
  Administered 2017-12-20: 0.25 mg via SUBLINGUAL
  Filled 2017-12-20: qty 2

## 2017-12-20 MED ORDER — VANCOMYCIN HCL 10 G IV SOLR
1500.0000 mg | Freq: Once | INTRAVENOUS | Status: AC
  Administered 2017-12-20: 1500 mg via INTRAVENOUS
  Filled 2017-12-20: qty 1500

## 2017-12-20 MED ORDER — ALBUMIN HUMAN 25 % IV SOLN
25.0000 g | Freq: Once | INTRAVENOUS | Status: AC
  Administered 2017-12-20: 25 g via INTRAVENOUS

## 2017-12-20 MED ORDER — ESCITALOPRAM OXALATE 10 MG PO TABS
10.0000 mg | ORAL_TABLET | Freq: Every day | ORAL | Status: DC
  Administered 2017-12-20 – 2017-12-22 (×3): 10 mg via ORAL
  Filled 2017-12-20 (×4): qty 1

## 2017-12-20 MED ORDER — SEVELAMER CARBONATE 800 MG PO TABS
2400.0000 mg | ORAL_TABLET | Freq: Three times a day (TID) | ORAL | Status: DC
  Administered 2017-12-20 – 2017-12-22 (×7): 2400 mg via ORAL
  Filled 2017-12-20 (×8): qty 3

## 2017-12-20 MED ORDER — ACETAMINOPHEN 650 MG RE SUPP: 650.0000 mg | Freq: Four times a day (QID) | RECTAL | Status: DC | PRN

## 2017-12-20 MED ORDER — SODIUM CHLORIDE 0.9 % IV SOLN: 100.0000 mL | INTRAVENOUS | Status: DC | PRN

## 2017-12-20 MED ORDER — CARVEDILOL 6.25 MG PO TABS
6.2500 mg | ORAL_TABLET | Freq: Two times a day (BID) | ORAL | Status: DC
  Administered 2017-12-21: 6.25 mg via ORAL
  Filled 2017-12-20 (×5): qty 1

## 2017-12-20 MED ORDER — ACETAMINOPHEN 325 MG PO TABS
650.0000 mg | ORAL_TABLET | Freq: Four times a day (QID) | ORAL | Status: DC | PRN
Start: 2017-12-20 — End: 2017-12-23

## 2017-12-20 MED ORDER — FUROSEMIDE 10 MG/ML IJ SOLN
80.0000 mg | Freq: Once | INTRAMUSCULAR | Status: AC
  Administered 2017-12-20: 80 mg via INTRAVENOUS
  Filled 2017-12-20: qty 8

## 2017-12-20 MED ORDER — LIDOCAINE HCL (PF) 1 % IJ SOLN: 5.0000 mL | INTRAMUSCULAR | Status: DC | PRN

## 2017-12-20 MED ORDER — ALBUTEROL SULFATE (2.5 MG/3ML) 0.083% IN NEBU
2.5000 mg | INHALATION_SOLUTION | Freq: Once | RESPIRATORY_TRACT | Status: DC
  Filled 2017-12-20: qty 3

## 2017-12-20 MED ORDER — ASPIRIN 81 MG PO CHEW
81.0000 mg | CHEWABLE_TABLET | Freq: Every day | ORAL | Status: DC
  Administered 2017-12-20 – 2017-12-22 (×3): 81 mg via ORAL
  Filled 2017-12-20 (×4): qty 1

## 2017-12-20 MED ORDER — LIDOCAINE-PRILOCAINE 2.5-2.5 % EX CREA
1.0000 "application " | TOPICAL_CREAM | CUTANEOUS | Status: DC | PRN
  Filled 2017-12-20: qty 5

## 2017-12-20 MED ORDER — APIXABAN 5 MG PO TABS
5.0000 mg | ORAL_TABLET | Freq: Two times a day (BID) | ORAL | Status: DC
  Administered 2017-12-20 – 2017-12-22 (×5): 5 mg via ORAL
  Filled 2017-12-20 (×7): qty 1

## 2017-12-20 MED ORDER — DIVALPROEX SODIUM 125 MG PO CSDR
500.0000 mg | DELAYED_RELEASE_CAPSULE | Freq: Two times a day (BID) | ORAL | Status: DC
  Administered 2017-12-20 – 2017-12-22 (×5): 500 mg via ORAL
  Filled 2017-12-20 (×7): qty 4

## 2017-12-20 MED ORDER — RENA-VITE PO TABS
1.0000 | ORAL_TABLET | Freq: Every day | ORAL | Status: DC
  Administered 2017-12-21 – 2017-12-22 (×2): 1 via ORAL
  Filled 2017-12-20 (×3): qty 1

## 2017-12-20 MED ORDER — PRO-STAT SUGAR FREE PO LIQD
30.0000 mL | Freq: Two times a day (BID) | ORAL | Status: DC
  Administered 2017-12-21: 30 mL via ORAL
  Filled 2017-12-20 (×2): qty 30

## 2017-12-20 MED ORDER — CINACALCET HCL 30 MG PO TABS
90.0000 mg | ORAL_TABLET | Freq: Every day | ORAL | Status: DC
  Administered 2017-12-20 – 2017-12-22 (×3): 90 mg via ORAL
  Filled 2017-12-20 (×3): qty 3

## 2017-12-20 MED ORDER — GABAPENTIN 100 MG PO CAPS
100.0000 mg | ORAL_CAPSULE | Freq: Two times a day (BID) | ORAL | Status: DC
  Administered 2017-12-20 – 2017-12-22 (×5): 100 mg via ORAL
  Filled 2017-12-20 (×8): qty 1

## 2017-12-20 MED ORDER — ATORVASTATIN CALCIUM 80 MG PO TABS
80.0000 mg | ORAL_TABLET | Freq: Every day | ORAL | Status: DC
  Administered 2017-12-20 – 2017-12-22 (×3): 80 mg via ORAL
  Filled 2017-12-20: qty 4
  Filled 2017-12-20 (×3): qty 1

## 2017-12-20 NOTE — ED Notes (Signed)
Iv team consulted

## 2017-12-20 NOTE — ED Notes (Signed)
2nd attempt to give report rn unavailable

## 2017-12-20 NOTE — Progress Notes (Signed)
Pharmacy Antibiotic Note  Hayden [name] is a 47 y.o. male admitted on 12/20/2017 with SOB/sepsis.  Pharmacy has been consulted for Vancomycin and Zosyn  dosing.  Plan: Vancomycin 1500 mg IV now, then 750 mg IV after each HD Zosyn 3.375 g IV q12h      Temp (24hrs), Avg:99.4 F (37.4 C), Min:99.4 F (37.4 C), Max:99.4 F (37.4 C)  Recent Labs  Lab 12/20/17 0016 12/20/17 0445  WBC 7.4  --   CREATININE 4.62*  --   LATICACIDVEN  --  0.84    CrCl cannot be calculated (Unknown ideal weight.).    Allergies  Allergen Reactions  . Dilaudid [Hydromorphone Hcl] Other (See Comments)    ABNORMAL BEHAVIOR, "VERBALLY AND PHYSICALLY ABUSIVE," PSYCHOSIS  . Morphine And Related Itching  . Adhesive [Tape] Other (See Comments)    Redness from adhesive tape if left on too long, paper tape is preferred     Caryl Pina 12/20/2017 5:22 AM

## 2017-12-20 NOTE — ED Notes (Signed)
Pain lt hip ain med given
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
## 2017-12-20 NOTE — ED Notes (Signed)
The admitting doctor is at bedside

## 2017-12-20 NOTE — ED Notes (Signed)
IV attempted x1, unsuccessful.  

## 2017-12-20 NOTE — ED Notes (Signed)
Patient transported to CT 

## 2017-12-20 NOTE — ED Notes (Signed)
One unsuccessful attempt to start iv  Pt does not want his hand stuck

## 2017-12-20 NOTE — ED Notes (Signed)
Pt refused 2nd set blood culture.  Nurse notified.
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
## 2017-12-20 NOTE — Progress Notes (Signed)
Patient seen and evaluated, chart reviewed, please see EMR for updated orders. Please see full H&P dictated by admitting physician Dr Renetta Chalk for same date of service.     47 year old with past medical history relevant for ESRD (M/W/F, HTN, Hx DM, Bipolar disorder, Hx CVA, Hx seizures, PVD s/p R AKA, L leg calciphylaxis, Hx heart block (s/p pacemaker) admitted on 12/20/2017 with volume overload, for hemodialysis on 12/20/2017.Marland Kitchen  Patient apparently has a habit of signing of early from hemodialysis sessions,  Plan of care discussed with patient, his wife and his daughter at bedside, questions answered  Patient is repeatedly threatening to leave AMA despite persuasion to stay for further evaluation and treatment  Elevated troponin noted patient declines further cardiovascular work-up or cardiology consult at this time, chest pain-free  Patient seen and evaluated, chart reviewed, please see EMR for updated orders. Please see full H&P dictated by admitting physician Dr Renetta Chalk for same date of service.

## 2017-12-20 NOTE — ED Notes (Signed)
Pt refused the rectal temp he wants to go home

## 2017-12-20 NOTE — ED Notes (Signed)
The pt refused to get on the stretcher initially  He refuses to take his shirt off  And he refuses to get an iv    Sob  For 3 days  Dialysis pt dialyzed earlier today.  Lt arm and lt leg pain painful this is usual.  He took a xanax 2100.

## 2017-12-20 NOTE — Progress Notes (Signed)
Received pt to ED to 4E11, VSS, pt c/o generalized pain 10/10, PRN med given. Pt refused a gown and CHG bath. Tele placed and verified x2. Wife and pt updated on plan of care. Will continue to monitor.  Jaymes Graff, RN

## 2017-12-20 NOTE — ED Notes (Signed)
unsuccessdful attempt to give report  rn hanging blood  She will cLL BACK

## 2017-12-20 NOTE — ED Notes (Signed)
Report given to rn [id number] 

## 2017-12-20 NOTE — ED Triage Notes (Signed)
C/o increased sob x 3 days.  Reports generalized chronic pain all over.  States last time he was admitted he signed a DNR and no longer wishes to be a DNR.  Pt speaking in complete sentences.
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
## 2017-12-21 LAB — BASIC METABOLIC PANEL
Anion gap: 10 (ref 5–15)
BUN: 24 mg/dL — AB (ref 6–20)
CHLORIDE: 99 mmol/L — AB (ref 101–111)
CO2: 31 mmol/L (ref 22–32)
CREATININE: 4.15 mg/dL — AB (ref 0.61–1.24)
Calcium: 7.7 mg/dL — ABNORMAL LOW (ref 8.9–10.3)
GFR, EST AFRICAN AMERICAN: 18 mL/min — AB (ref >60)
GFR, EST NON AFRICAN AMERICAN: 16 mL/min — AB (ref >60)
Glucose, Bld: 82 mg/dL (ref 65–99)
Potassium: 4.3 mmol/L (ref 3.5–5.1)
SODIUM: 140 mmol/L (ref 135–145)

## 2017-12-21 LAB — CBC
HCT: 35 % — ABNORMAL LOW (ref 39.0–52.0)
Hemoglobin: 10.7 g/dL — ABNORMAL LOW (ref 13.0–17.0)
MCH: 29.6 pg (ref 26.0–34.0)
MCHC: 30.6 g/dL (ref 30.0–36.0)
MCV: 96.7 fL (ref 78.0–100.0)
Platelets: 214 10*3/uL (ref 150–400)
RBC: 3.62 MIL/uL — AB (ref 4.22–5.81)
RDW: 15.9 % — AB (ref 11.5–15.5)
WBC: 6.1 10*3/uL (ref 4.0–10.5)

## 2017-12-21 LAB — TROPONIN I: Troponin I: 0.53 ng/mL (ref <0.03)

## 2017-12-21 LAB — HEPATITIS B CORE ANTIBODY, TOTAL: HEP B C TOTAL AB: NEGATIVE

## 2017-12-21 LAB — HEPATITIS B SURFACE ANTIBODY,QUALITATIVE: Hep B S Ab: REACTIVE

## 2017-12-21 MED ORDER — IPRATROPIUM-ALBUTEROL 0.5-2.5 (3) MG/3ML IN SOLN
3.0000 mL | Freq: Three times a day (TID) | RESPIRATORY_TRACT | Status: DC | PRN
  Administered 2017-12-22: 3 mL via RESPIRATORY_TRACT
  Filled 2017-12-21: qty 3

## 2017-12-21 MED ORDER — NICOTINE 14 MG/24HR TD PT24
14.0000 mg | MEDICATED_PATCH | Freq: Every day | TRANSDERMAL | Status: DC
  Administered 2017-12-21 – 2017-12-22 (×2): 14 mg via TRANSDERMAL
  Filled 2017-12-21 (×3): qty 1

## 2017-12-21 MED ORDER — GUAIFENESIN-DM 100-10 MG/5ML PO SYRP
15.0000 mL | ORAL_SOLUTION | ORAL | Status: DC | PRN
  Administered 2017-12-21: 15 mL via ORAL
  Filled 2017-12-21: qty 15

## 2017-12-21 MED ORDER — VANCOMYCIN HCL IN DEXTROSE 750-5 MG/150ML-% IV SOLN
750.0000 mg | Freq: Once | INTRAVENOUS | Status: AC
  Administered 2017-12-21: 750 mg via INTRAVENOUS
  Filled 2017-12-21: qty 150

## 2017-12-21 MED ORDER — DIPHENHYDRAMINE HCL 25 MG PO CAPS: 25.0000 mg | ORAL_CAPSULE | Freq: Once | ORAL | Status: DC

## 2017-12-21 MED ORDER — IPRATROPIUM-ALBUTEROL 0.5-2.5 (3) MG/3ML IN SOLN
3.0000 mL | Freq: Four times a day (QID) | RESPIRATORY_TRACT | Status: DC
  Administered 2017-12-21 (×2): 3 mL via RESPIRATORY_TRACT
  Filled 2017-12-21 (×3): qty 3

## 2017-12-21 MED ORDER — LEVALBUTEROL HCL 0.63 MG/3ML IN NEBU: 0.6300 mg | INHALATION_SOLUTION | Freq: Three times a day (TID) | RESPIRATORY_TRACT | Status: DC | PRN

## 2017-12-21 NOTE — Progress Notes (Signed)
Pharmacy Antibiotic Note  Hayden [name] is a 47 y.o. male admitted on 12/20/2017 with SOB/sepsis?.  Pharmacy has been consulted for Vancomycin and Zosyn  Dosing.  Extra HD sessions Saturday and Sunday MRSA PCR negative  Plan: Vancomycin 750 mg iv x 1 today to cover extra session - Consider stopping? Continue Zosyn 3.375 g IV q12h   Height: 5\' 10"  (177.8 cm) Weight: 216 lb 14.9 oz (98.4 kg) IBW/kg (Calculated) : 73  Temp (24hrs), Avg:98.5 F (36.9 C), Min:98.1 F (36.7 C), Max:98.7 F (37.1 C)  Recent Labs  Lab 12/20/17 0016 12/20/17 0428 12/20/17 0445 12/20/17 0658 12/21/17 0909  WBC 7.4  --   --   --  6.1  CREATININE 4.62*  --   --   --  4.15*  LATICACIDVEN  --  0.9 0.84 0.8  --     Estimated Creatinine Clearance: 26.5 mL/min (A) (by C-G formula based on SCr of 4.15 mg/dL (H)).    Allergies  Allergen Reactions  . Dilaudid [Hydromorphone Hcl] Other (See Comments)    ABNORMAL BEHAVIOR, "VERBALLY AND PHYSICALLY ABUSIVE," PSYCHOSIS  . Morphine And Related Itching  . Adhesive [Tape] Other (See Comments)    Redness from adhesive tape if left on too long, paper tape is preferred     Tad Moore 12/21/2017 12:17 PM

## 2017-12-21 NOTE — Progress Notes (Addendum)
Patient Demographics:    Hayden [name], is a 47 y.o. male, DOB - Sep 29, 1970, OLM:[id number]  Admit date - 12/20/2017   Admitting Physician Hayden Patience, MD  Outpatient Primary MD for the patient is Hayden Bayley, DO  LOS - 1   Chief Complaint  Patient presents with  . Shortness of Breath        Subjective:    Hayden [name] today has no fevers, no emesis,  No chest pain, at bedside, increased shortness of breath, cough with slight white to yellowish sputum, no chills,  Assessment  & Plan :    Active Problems:   ESRD (end stage renal disease) on dialysis ([place])   HTN (hypertension)   History of CVA (cerebrovascular accident)   Above knee amputation of right lower extremity ([place])   ESRD on hemodialysis ([place])   Atrial flutter ([place]); CHA2DS2Vasc -5, on warfarin   Severe aortic stenosis   Anemia of chronic disease   Chronic combined systolic and diastolic CHF (congestive heart failure) (HCC)   Acute respiratory failure with hypoxia (HCC)  Brief summary 47 year old with past medical history relevant for ESRD (M/W/F, HTN, Hx DM, Bipolar disorder, Hx CVA, Hx seizures, PVD s/p R AKA, L leg calciphylaxis, Hx heart block (s/p pacemaker) admitted on 12/20/2017 with volume overload, had additional hemodialysis session. H/o Calciphylaxis  Patient apparently has a habit of signing of early from hemodialysis sessions, Patient is repeatedly threatening to leave AMA despite persuasion to stay for further evaluation and treatment   Plan:- 1)ESRD with volume overload--- discussed with Dr. Justin Mend from nephrology service patient will have additional hemodialysis session on 12/21/2017, pt has H/o Calciphylaxis   2) acute hypoxic respiratory failure--/resolved as of today, patient continues to have some cough and congestion, diagnosis as above #1, currently being treated with Vanco and Zosyn, may discontinue  vancomycin after today's dose which will be given postdialysis, continue Zosyn for now, repeat chest x-ray on 12/22/2017 after diuresis, bronchodilators, mucolytics as ordered  3)H/o CAD--- LHC 07/2017, obstructive CAD not amenable to revascularization, Elevated troponin noted patient declines further cardiovascular work-up or cardiology consult at this time, chest pain-free, continue aspirin , Lipitor and Coreg  4)H/o severe aortic stenosis and mitral stenosis--per cardiology service previously deemed not amenable to intervention  5)DEpression--patient has history of poor compliance, affect is flat, denies suicidal ideation or plan, continue Lexapro and Depakote  6)DM2--last A1c less than 6, currently managed with diet only  7)H/o renal cell carcinoma, patient is status post previous partial nephrectomy  8)H/o atrial flutter with prior stroke--apixaban for secondary stroke prophylaxis and Coreg for  rate control, continue Lipitor for history of prior stroke  9)HFrEF--- last known EF is 35 to 40%, patient with history of combined diastolic and systolic dysfunction CHF, now with volume overload and exacerbation, volume status to be addressed with hemodialysis as noted above #1  Code Status : Full  Disposition Plan  : home   Consults  :  Neprhology   DVT Prophylaxis  :  Apixaban   Lab Results  Component Value Date   PLT 214 12/21/2017    Inpatient Medications  Scheduled Meds: . albuterol  2.5 mg Nebulization Once  . apixaban  5 mg Oral BID  . aspirin  81 mg Oral Daily  . atorvastatin  80 mg Oral q1800  . carvedilol  6.25 mg Oral BID WC  . Chlorhexidine Gluconate Cloth  6 each Topical Q0600  . cinacalcet  90 mg Oral Q breakfast  . cloNIDine  0.2 mg Transdermal Weekly  . diphenhydrAMINE  25 mg Oral Once  . divalproex  500 mg Oral Q12H  . escitalopram  10 mg Oral Daily  . feeding supplement (PRO-STAT SUGAR FREE 64)  30 mL Oral BID  . ferric citrate  840 mg Oral TID WC  .  gabapentin  100 mg Oral BID  . multivitamin  1 tablet Oral QHS  . nicotine  14 mg Transdermal Daily  . pantoprazole  40 mg Oral Daily  . sevelamer carbonate  2,400 mg Oral TID WC   Continuous Infusions: . piperacillin-tazobactam (ZOSYN)  IV Stopped (12/21/17 1257)  . [START ON 12/22/2017] vancomycin     PRN Meds:.acetaminophen **OR** acetaminophen, ferric citrate, guaiFENesin-dextromethorphan, ipratropium-albuterol, levalbuterol, oxyCODONE    Anti-infectives (From admission, onward)   Start     Dose/Rate Route Frequency Ordered Stop   12/22/17 1200  vancomycin (VANCOCIN) IVPB 750 mg/150 ml premix     750 mg 150 mL/hr over 60 Minutes Intravenous Every [name]-F (Hemodialysis) 12/20/17 0524     12/21/17 1230  vancomycin (VANCOCIN) IVPB 750 mg/150 ml premix     750 mg 150 mL/hr over 60 Minutes Intravenous  Once 12/21/17 1219 12/21/17 1425   12/20/17 0600  vancomycin (VANCOCIN) 1,500 mg in sodium chloride 0.9 % 500 mL IVPB     1,500 mg 250 mL/hr over 120 Minutes Intravenous  Once 12/20/17 0524 12/20/17 0828   12/20/17 0600  piperacillin-tazobactam (ZOSYN) IVPB 3.375 g     3.375 g 12.5 mL/hr over 240 Minutes Intravenous Every 12 hours 12/20/17 0524          Objective:   Vitals:   12/21/17 0905 12/21/17 0923 12/21/17 1130 12/21/17 1441  BP: 122/74  118/69   Pulse: 72  77 73  Resp: 18  19 (!) 22  Temp: 98.3 F (36.8 C)     TempSrc: Oral     SpO2: 90% 93% 99% 100%  Weight:      Height:        Wt Readings from Last 3 Encounters:  12/20/17 98.4 kg (216 lb 14.9 oz)  08/29/17 89.4 kg (197 lb)  08/16/17 110 kg (242 lb 8.1 oz)     Intake/Output Summary (Last 24 hours) at 12/21/2017 1742 Last data filed at 12/21/2017 1328 Gross per 24 hour  Intake 113.5 ml  Output -  Net 113.5 ml     Physical Exam  Gen:- Awake Alert,  In no apparent distress  HEENT:- [place].AT, No sclera icterus Neck-Supple Neck,No JVD, Lungs-patient sounds rhonchorous, very few scattered wheezes CV- S1, S2  normal, right-sided pacemaker Abd-  +ve B.Sounds, Abd Soft, No tenderness,    Extremity/Skin:-Right AKA, left leg chronic Calciphylaxis ulcers without secondary infection symptoms,, left forearm hemodialysis AV fistula site to possible [employer] memory    Psych-affect is flat, oriented x3 Neuro-no new focal deficits, no tremors   Data Review:   Micro Results Recent Results (from the past 240 hour(s))  Blood culture (routine x 2)     Status: None (Preliminary result)   Collection Time: 12/20/17  4:30 AM  Result Value Ref Range Status   Specimen Description BLOOD RIGHT HAND  Final   Special Requests   Final    BOTTLES DRAWN AEROBIC  AND ANAEROBIC Blood Culture adequate volume   Culture   Final    NO GROWTH 1 DAY Performed at [facility], [place] [address], [place], [place] [address]    Report Status PENDING  Incomplete  MRSA PCR Screening     Status: None   Collection Time: 12/20/17  9:48 AM  Result Value Ref Range Status   MRSA by PCR NEGATIVE NEGATIVE Final    Comment:        The GeneXpert MRSA Assay (FDA approved for NASAL specimens only), is one component of a comprehensive MRSA colonization surveillance program. It is not intended to diagnose MRSA infection nor to guide or monitor treatment for MRSA infections. Performed at [facility], [place] [address] [place], [place] [address]   Blood culture (routine x 2)     Status: None (Preliminary result)   Collection Time: 12/20/17 10:27 AM  Result Value Ref Range Status   Specimen Description BLOOD RIGHT HAND  Final   Special Requests   Final    BOTTLES DRAWN AEROBIC AND ANAEROBIC Blood Culture adequate volume   Culture   Final    NO GROWTH 1 DAY Performed at [facility], [place] [address] [place], [place] [address]    Report Status PENDING  Incomplete    Radiology Reports Dg Chest 2 View  Result Date: 12/20/2017 CLINICAL DATA:  Short of breath EXAM: CHEST - 2 VIEW COMPARISON:  12/19/2017, 07/31/2017  FINDINGS: Right-sided pacing device as before. Elevation of the right diaphragm, chronic. Cardiomegaly with vascular congestion and mild pulmonary edema. Atelectasis or infiltrate at the right base. Aortic atherosclerosis. No pneumothorax. IMPRESSION: Cardiomegaly with vascular congestion and mild pulmonary edema. Patchy atelectasis or minimal infiltrate at the right base Electronically Signed   By: Donavan Foil M.D.   On: 12/20/2017 01:11     CBC Recent Labs  Lab 12/20/17 0016 12/21/17 0909  WBC 7.4 6.1  HGB 10.1* 10.7*  HCT 32.5* 35.0*  PLT 241 214  MCV 96.2 96.7  MCH 29.9 29.6  MCHC 31.1 30.6  RDW 15.9* 15.9*    Chemistries  Recent Labs  Lab 12/20/17 0016 12/21/17 0909  NA 140 140  K 4.2 4.3  CL 98* 99*  CO2 30 31  GLUCOSE 102* 82  BUN 31* 24*  CREATININE 4.62* 4.15*  CALCIUM 7.5* 7.7*   ------------------------------------------------------------------------------------------------------------------ No results for input(s): CHOL, HDL, LDLCALC, TRIG, CHOLHDL, LDLDIRECT in the last 72 hours.  Lab Results  Component Value Date   HGBA1C 5.4 06/20/2015   ------------------------------------------------------------------------------------------------------------------ No results for input(s): TSH, T4TOTAL, T3FREE, THYROIDAB in the last 72 hours.  Invalid input(s): FREET3 ------------------------------------------------------------------------------------------------------------------ No results for input(s): VITAMINB12, FOLATE, FERRITIN, TIBC, IRON, RETICCTPCT in the last 72 hours.  Coagulation profile Recent Labs  Lab 12/20/17 0658  INR 1.39    No results for input(s): DDIMER in the last 72 hours.  Cardiac Enzymes Recent Labs  Lab 12/20/17 1027 12/20/17 1759 12/21/17 0909  TROPONINI 0.74* 0.61* 0.53*   ------------------------------------------------------------------------------------------------------------------    Component Value Date/Time   BNP  192.0 (H) 11/04/2015 1941     Roxan Hockey M.D on 12/21/2017 at 5:42 PM  Between 7am to 7pm - Pager - [phone]  After 7pm go to [email or web address] - password [email or web address]  [name] -  Office  [phone]   Voice Recognition Viviann Spare dictation system was used to create this note, attempts have been made to correct errors. Please contact the author with questions and/or clarifications.

## 2017-12-21 NOTE — Progress Notes (Signed)
Pt complaining of inability to swallow meds because of drainage/fluid in mouth/throat. Paged on call MD. New orders given. Will continue to follow.

## 2017-12-21 NOTE — Progress Notes (Signed)
Pt refused bath with both NT and RN. Will continue to follow.

## 2017-12-21 NOTE — Progress Notes (Addendum)
[facility] Progress Note   Subjective:  Seen in room. S/p HD yesterday with 4L UF, still dyspneic with wheezing today - remains overloaded. Wife reports he has been coughing up yellow mucus. Disc w/ hospitalist, will start duoneb and plan for HD later today (unfortunately it will probably be early evening d/t other priority HD cases). Agree in the assessment  Most likely the dyspnea is multifactorial although dialysis certainly could help with additional fluid removal   Objective Vitals:   12/20/17 2312 12/21/17 0502 12/21/17 0905 12/21/17 0923  BP: 119/69 (!) 149/73 122/74   Pulse: 78 73 72   Resp: 17 (!) 27 18   Temp: 98.7 F (37.1 C) 98.6 F (37 C) 98.3 F (36.8 C)   TempSrc: Oral Oral Oral   SpO2: 97% 95% 90% 93%  Weight:      Height:       Physical Exam General: Obese, chronically ill-appearing. + nasal oxygen.  Heart: RRR; 2/6 murmur Lungs: Diffuse wheezing/rhonchi throughout all lung fields Abdomen: soft, non-tender Extremities: R AKA, LLE + edema with calciphylaxis wound on calf Dialysis Access: L AVF + thrill  Additional Objective Labs: Basic Metabolic Panel: Recent Labs  Lab 12/20/17 0016  NA 140  K 4.2  CL 98*  CO2 30  GLUCOSE 102*  BUN 31*  CREATININE 4.62*  CALCIUM 7.5*   CBC: Recent Labs  Lab 12/20/17 0016 12/21/17 0909  WBC 7.4 6.1  HGB 10.1* 10.7*  HCT 32.5* 35.0*  MCV 96.2 96.7  PLT 241 214   Cardiac Enzymes: Recent Labs  Lab 12/20/17 0428 12/20/17 1027 12/20/17 1759  TROPONINI 1.01* 0.74* 0.61*   Studies/Results: Dg Chest 2 View  Result Date: 12/20/2017 CLINICAL DATA:  Short of breath EXAM: CHEST - 2 VIEW COMPARISON:  12/19/2017, 07/31/2017 FINDINGS: Right-sided pacing device as before. Elevation of the right diaphragm, chronic. Cardiomegaly with vascular congestion and mild pulmonary edema. Atelectasis or infiltrate at the right base. Aortic atherosclerosis. No pneumothorax. IMPRESSION: Cardiomegaly with vascular  congestion and mild pulmonary edema. Patchy atelectasis or minimal infiltrate at the right base Electronically Signed   By: Donavan Foil M.D.   On: 12/20/2017 01:11   Medications: . piperacillin-tazobactam (ZOSYN)  IV 12.5 mL/hr at 12/21/17 0910  . [START ON 12/22/2017] vancomycin     . albuterol  2.5 mg Nebulization Once  . apixaban  5 mg Oral BID  . aspirin  81 mg Oral Daily  . atorvastatin  80 mg Oral q1800  . carvedilol  6.25 mg Oral BID WC  . Chlorhexidine Gluconate Cloth  6 each Topical Q0600  . cinacalcet  90 mg Oral Q breakfast  . cloNIDine  0.2 mg Transdermal Weekly  . diphenhydrAMINE  25 mg Oral Once  . divalproex  500 mg Oral Q12H  . escitalopram  10 mg Oral Daily  . feeding supplement (PRO-STAT SUGAR FREE 64)  30 mL Oral BID  . ferric citrate  840 mg Oral TID WC  . gabapentin  100 mg Oral BID  . ipratropium-albuterol  3 mL Nebulization Q6H  . multivitamin  1 tablet Oral QHS  . pantoprazole  40 mg Oral Daily  . sevelamer carbonate  2,400 mg Oral TID WC    Dialysis Orders: MWF at [facility] 4hr, 450/800, EDW 90kg, 2K/2Ca, AVF, no heparin - Mircera 50mcg IV q 2 weeks (last 6/14) - Na Thiosulfate 25g q HD  Assessment/Plan: 1. Dyspnea/volume overload: S/p extra HD 6/22 for volume - 4L removed. Remains 8kg above EDW,  cuts outpatient HD every time despite adequate counseling. Hasn't got below 97kg in a while - will try to work on getting him down. Another dialysis treatment this evening may be needed and will need reassessment this afternoon 2.  ?Pneumonia: CXR findings likely just edema, but with productive cough and rhonchi on exam. BCx collected, continue Vanc/Zosyn. 3.  ESRD: See above, extra HD 6/22 for volume and prob needs another session today. Will tack on at end of day (after urgent cases), but reassess after neb treatment to see if helps. His usual HD days are MWF. 4.  Hypotension/volume: Chronic low BP, IV albumin given w/ HD for BP support. 5.  Anemia: Hgb 10.7. Due  for ESA next week. 6.  Metabolic bone disease: Ca low side, current calciphylaxis wound, keep there. 7.  Nutrition: Alb low, will add supps. 8.  Hx calciphylaxis (L calf area): Will continue Na thiosulfate with usual HD. 9.  CAD: ^ trop on admit, trending down. Per primary. 10.  Hx pacemaker 11.  Hx seizures 12.  Bipolar disorder 13.  Hx A-fib/flutter: On Eliquis.  I have seen and examined this patient and agree with the plan of care , the dyspnea this morning is most likely multifactorial   Tracheobronchitis and volume overload are most likely contributing causes  We may need more dialysis this pm although will reassess   [facility] W 12/21/2017, 1:54 PM    Veneta Penton, PA-C 12/21/2017, 10:01 AM  [facility] Pager: [phone]

## 2017-12-22 ENCOUNTER — Inpatient Hospital Stay (HOSPITAL_COMMUNITY): Payer: Medicare Other

## 2017-12-22 MED ORDER — VANCOMYCIN HCL IN DEXTROSE 750-5 MG/150ML-% IV SOLN
750.0000 mg | INTRAVENOUS | Status: DC
  Administered 2017-12-23: 750 mg via INTRAVENOUS
  Filled 2017-12-22: qty 150

## 2017-12-22 MED ORDER — CHLORHEXIDINE GLUCONATE CLOTH 2 % EX PADS: 6.0000 | MEDICATED_PAD | Freq: Every day | CUTANEOUS | Status: DC

## 2017-12-22 NOTE — Progress Notes (Signed)
Pt refused to go to hemodialysis tonight because he said he will be going today anyway. HD was made aware. Will continue to follow.

## 2017-12-22 NOTE — Progress Notes (Signed)
Pt reported partial vision loss in L eye. Pt stated that he doesn't want to have any additional test to diagnose vision loss. This RN spoke with pt about goals, needs and safety. Pt denies suicidal ideation. Pt states "I want to live as long as my natural life goes". Pt also states "I want to see the doctor about getting oxygen and then I want to go home. I don't want to stay for dialysis today." pt agreed to stay to receive antibiotics and speak with someone about possibility of getting oxygen for home use. Will continue to follow.

## 2017-12-22 NOTE — Progress Notes (Addendum)
Patient Demographics:    Hayden [name], is a 47 y.o. male, DOB - [birth year], BPZ:[id number]  Admit date - 12/20/2017   Admitting Physician Rise Patience, MD  Outpatient Primary MD for the patient is [name], DO  LOS - 2   Chief Complaint  Patient presents with  . Shortness of Breath        Subjective:    Hayden [name] today has no fevers, no emesis,  No chest pain, at bedside, son at bedside, questions answered, patient is threatening to leave AMA, episodes of shortness of breath persist  Assessment  & Plan :    Principal Problem:   Chronic combined systolic and diastolic CHF (congestive heart failure) (HCC) Active Problems:   ESRD (end stage renal disease) on dialysis ([place])   HTN (hypertension)   History of CVA (cerebrovascular accident)   Above knee amputation of right lower extremity ([place])   ESRD on hemodialysis ([place])   Atrial flutter ([place]); CHA2DS2Vasc -5, on warfarin   Severe aortic stenosis   Anemia of chronic disease   Acute respiratory failure with hypoxia ([place])  Brief summary 47 year old with past medical history relevant for ESRD (M/W/F, HTN, Hx DM, Bipolar disorder, Hx CVA, Hx seizures, PVD s/p R AKA, L leg calciphylaxis, Hx heart block (s/p pacemaker) admitted on 12/20/2017 with volume overload, had additional hemodialysis session. H/o Calciphylaxis  Patient apparently has a habit of signing of early from hemodialysis sessions, Patient is repeatedly threatening to leave AMA despite persuasion to stay for further evaluation and treatment   Plan:- 1)ESRD with volume overload--- appears to be still somewhat volume overloaded, continue hemodialysis per nephrology team, pt has H/o Calciphylaxis   2) acute hypoxic respiratory failure--resolved hypoxia, patient continues to have some cough and congestion, diagnosis as above #1, treated with Vanco and Zosyn, no fevers, repeat  chest x-ray from 12/22/2017 with vascular congestion, possible right infrahilar atelectasis versus infiltrates, I favor atelectasis , discontinue vancomycin , continue Zosyn for now, c/n bronchodilators, mucolytics as ordered  3)H/o CAD--- LHC 07/2017, obstructive CAD not amenable to revascularization, Elevated troponin noted patient declines further cardiovascular work-up or cardiology consult at this time, chest pain-free, continue aspirin , Lipitor and Coreg  4)H/o severe aortic stenosis and mitral stenosis--per cardiology service previously deemed not amenable to intervention  5)DEpression--patient has history of poor compliance, affect is flat, denies suicidal ideation or plan, continue Lexapro and Depakote  6)DM2--last A1c less than 6, currently managed with diet only  7)H/o renal cell carcinoma, patient is status post previous partial nephrectomy  8)H/o atrial flutter with prior stroke--apixaban for secondary stroke prophylaxis and Coreg for  rate control, continue Lipitor for history of prior stroke  9)HFrEF--- last known EF is 35 to 40%, patient with history of combined diastolic and systolic dysfunction CHF, now with volume overload and exacerbation, volume status to be addressed with hemodialysis as noted above #1  10)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 albuterol  2.5 mg Nebulization Once  . apixaban  5 mg Oral BID  . aspirin  81 mg Oral Daily  . atorvastatin  80 mg Oral q1800  . carvedilol  6.25 mg Oral BID WC  . Chlorhexidine Gluconate Cloth  6 each Topical Q0600  . [START ON 12/23/2017] Chlorhexidine Gluconate Cloth  6 each Topical Q0600  . cinacalcet  90 mg Oral Q breakfast  . cloNIDine  0.2 mg  Transdermal Weekly  . diphenhydrAMINE  25 mg Oral Once  . divalproex  500 mg Oral Q12H  . escitalopram  10 mg Oral Daily  . feeding supplement (PRO-STAT SUGAR FREE 64)  30 mL Oral BID  . ferric citrate  840 mg Oral TID WC  . gabapentin  100 mg Oral BID  . multivitamin  1 tablet Oral QHS  . nicotine  14 mg Transdermal Daily  . pantoprazole  40 mg Oral Daily  . sevelamer carbonate  2,400 mg Oral TID WC   Continuous Infusions: . piperacillin-tazobactam (ZOSYN)  IV Stopped (12/22/17 0908)  . vancomycin     PRN Meds:.acetaminophen **OR** acetaminophen, ferric citrate, guaiFENesin-dextromethorphan, ipratropium-albuterol, levalbuterol, oxyCODONE    Anti-infectives (From admission, onward)   Start     Dose/Rate Route Frequency Ordered Stop   12/22/17 1200  vancomycin (VANCOCIN) IVPB 750 mg/150 ml premix     750 mg 150 mL/hr over 60 Minutes Intravenous Every [name]-F (Hemodialysis) 12/20/17 0524     12/21/17 1230  vancomycin (VANCOCIN) IVPB 750 mg/150 ml premix     750 mg 150 mL/hr over 60 Minutes Intravenous  Once 12/21/17 1219 12/21/17 1425   12/20/17 0600  vancomycin (VANCOCIN) 1,500 mg in sodium chloride 0.9 % 500 mL IVPB     1,500 mg 250 mL/hr over 120 Minutes Intravenous  Once 12/20/17 0524 12/20/17 0828   12/20/17 0600  piperacillin-tazobactam (ZOSYN) IVPB 3.375 g     3.375 g 12.5 mL/hr over 240 Minutes Intravenous Every 12 hours 12/20/17 0524          Objective:   Vitals:   12/22/17 1500 12/22/17 1546 12/22/17 1600 12/22/17 1633  BP:  113/70    Pulse: 75 74 62 76  Resp: (!) 21 19 18 20   Temp:  98.2 F (36.8 C)    TempSrc:  Oral    SpO2: 95% 95% (!) 84% 91%  Weight:      Height:        Wt Readings from Last 3 Encounters:  12/22/17 100 kg (220 lb 7.4 oz)  08/29/17 89.4 kg (197 lb)  08/16/17 110 kg (242 lb 8.1 oz)     Intake/Output Summary (Last 24 hours) at 12/22/2017 1712 Last data filed at 12/22/2017 1500 Gross per 24 hour  Intake 580 ml  Output -  Net 580 ml      Physical Exam  Gen:- Awake Alert,  In no apparent distress  HEENT:- [place].AT, No sclera icterus Neck-Supple Neck,No JVD, Lungs-bibasilar rales, air movement is fair CV- S1, S2 normal, right-sided pacemaker Abd-  +ve B.Sounds, Abd Soft, No tenderness,    Extremity/Skin:-Right AKA, left leg chronic Calciphylaxis ulcers without secondary infection symptoms,, left forearm hemodialysis AV fistula site with Thrill/Bruit Psych-affect is flat, oriented x3 Neuro-no new focal deficits, no tremors   Data Review:   Micro Results Recent Results (from the past 240 hour(s))  Blood culture (routine x 2)     Status: None (Preliminary result)   Collection Time: 12/20/17  4:30 AM  Result Value  Ref Range Status   Specimen Description BLOOD RIGHT HAND  Final   Special Requests   Final    BOTTLES DRAWN AEROBIC AND ANAEROBIC Blood Culture adequate volume   Culture   Final    NO GROWTH 2 DAYS Performed at [facility], [address], [place], [place] [address]    Report Status PENDING  Incomplete  MRSA PCR Screening     Status: None   Collection Time: 12/20/17  9:48 AM  Result Value Ref Range Status   MRSA by PCR NEGATIVE NEGATIVE Final    Comment:        The GeneXpert MRSA Assay (FDA approved for NASAL specimens only), is one component of a comprehensive MRSA colonization surveillance program. It is not intended to diagnose MRSA infection nor to guide or monitor treatment for MRSA infections. Performed at [facility], [place] [address], [place], [place] [address]   Blood culture (routine x 2)     Status: None (Preliminary result)   Collection Time: 12/20/17 10:27 AM  Result Value Ref Range Status   Specimen Description BLOOD RIGHT HAND  Final   Special Requests   Final    BOTTLES DRAWN AEROBIC AND ANAEROBIC Blood Culture adequate volume   Culture   Final    NO GROWTH 2 DAYS Performed at [facility], [place] [address], [place], [place] [address]    Report Status  PENDING  Incomplete    Radiology Reports Dg Chest 2 View  Result Date: 12/22/2017 CLINICAL DATA:  Shortness of breath. EXAM: CHEST - 2 VIEW COMPARISON:  Chest x-ray dated December 20, 2017. FINDINGS: Unchanged right chest wall pacer device. Stable cardiomegaly and pulmonary vascular congestion. Resolved interstitial edema. Unchanged elevation of the right hemidiaphragm with right infrahilar atelectasis versus infiltrate. No pleural effusion or pneumothorax. No acute osseous abnormality. IMPRESSION: 1. Unchanged right infrahilar atelectasis versus infiltrate. 2. Persistent mild pulmonary vascular congestion. Interstitial edema appears improved. Electronically Signed   By: Titus Dubin M.D.   On: 12/22/2017 15:14   Dg Chest 2 View  Result Date: 12/20/2017 CLINICAL DATA:  Short of breath EXAM: CHEST - 2 VIEW COMPARISON:  12/19/2017, 07/31/2017 FINDINGS: Right-sided pacing device as before. Elevation of the right diaphragm, chronic. Cardiomegaly with vascular congestion and mild pulmonary edema. Atelectasis or infiltrate at the right base. Aortic atherosclerosis. No pneumothorax. IMPRESSION: Cardiomegaly with vascular congestion and mild pulmonary edema. Patchy atelectasis or minimal infiltrate at the right base Electronically Signed   By: Donavan Foil M.D.   On: 12/20/2017 01:11     CBC Recent Labs  Lab 12/20/17 0016 12/21/17 0909  WBC 7.4 6.1  HGB 10.1* 10.7*  HCT 32.5* 35.0*  PLT 241 214  MCV 96.2 96.7  MCH 29.9 29.6  MCHC 31.1 30.6  RDW 15.9* 15.9*    Chemistries  Recent Labs  Lab 12/20/17 0016 12/21/17 0909  NA 140 140  K 4.2 4.3  CL 98* 99*  CO2 30 31  GLUCOSE 102* 82  BUN 31* 24*  CREATININE 4.62* 4.15*  CALCIUM 7.5* 7.7*   ------------------------------------------------------------------------------------------------------------------ No results for input(s): CHOL, HDL, LDLCALC, TRIG, CHOLHDL, LDLDIRECT in the last 72 hours.  Lab Results  Component Value Date    HGBA1C 5.4 06/20/2015   ------------------------------------------------------------------------------------------------------------------ No results for input(s): TSH, T4TOTAL, T3FREE, THYROIDAB in the last 72 hours.  Invalid input(s): FREET3 ------------------------------------------------------------------------------------------------------------------ No results for input(s): VITAMINB12, FOLATE, FERRITIN, TIBC, IRON, RETICCTPCT in the last 72 hours.  Coagulation profile Recent Labs  Lab 12/20/17 0658  INR 1.39    No results for input(s): DDIMER in the last 72 hours.  Cardiac Enzymes Recent Labs  Lab 12/20/17 1027 12/20/17 1759 12/21/17 0909  TROPONINI 0.74* 0.61* 0.53*   ------------------------------------------------------------------------------------------------------------------    Component Value Date/Time   BNP 192.0 (H) 11/04/2015 1443     Roxan Hockey M.D on 12/22/2017 at 5:12 PM  Between 7am to 7pm - Pager - [phone]  After 7pm go to [email or web address] - password [email or web address]  [name] -  Office  [phone]   Voice Recognition Viviann Spare dictation system was used to create this note, attempts have been made to correct errors. Please contact the author with questions and/or clarifications.

## 2017-12-22 NOTE — Progress Notes (Signed)
[facility] Progress Note   Subjective:  Seen in room. S/p HD yesterday with 4L UF 6/22.  Doesn't have orders for today. SOB better, thinks weights aren't accurate.   Objective Vitals:   12/22/17 1100 12/22/17 1300 12/22/17 1312 12/22/17 1316  BP:    121/73  Pulse: 76 76 77 77  Resp: 17 13 15 20   Temp:    98.3 F (36.8 C)  TempSrc:    Oral  SpO2: 90% 93% (!) 87% 95%  Weight:      Height:       Physical Exam General: Obese, chronically ill-appearing. + nasal oxygen.  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 Dialysis Access: L AVF + thrill  Additional Objective Labs: Basic Metabolic Panel: Recent Labs  Lab 12/20/17 0016 12/21/17 0909  NA 140 140  K 4.2 4.3  CL 98* 99*  CO2 30 31  GLUCOSE 102* 82  BUN 31* 24*  CREATININE 4.62* 4.15*  CALCIUM 7.5* 7.7*   CBC: Recent Labs  Lab 12/20/17 0016 12/21/17 0909  WBC 7.4 6.1  HGB 10.1* 10.7*  HCT 32.5* 35.0*  MCV 96.2 96.7  PLT 241 214   Cardiac Enzymes: Recent Labs  Lab 12/20/17 0428 12/20/17 1027 12/20/17 1759 12/21/17 0909  TROPONINI 1.01* 0.74* 0.61* 0.53*   Studies/Results: No results found. Medications: . piperacillin-tazobactam (ZOSYN)  IV 3.375 g (12/22/17 0508)  . vancomycin     . albuterol  2.5 mg Nebulization Once  . apixaban  5 mg Oral BID  . aspirin  81 mg Oral Daily  . atorvastatin  80 mg Oral q1800  . carvedilol  6.25 mg Oral BID WC  . Chlorhexidine Gluconate Cloth  6 each Topical Q0600  . cinacalcet  90 mg Oral Q breakfast  . cloNIDine  0.2 mg Transdermal Weekly  . diphenhydrAMINE  25 mg Oral Once  . divalproex  500 mg Oral Q12H  . escitalopram  10 mg Oral Daily  . feeding supplement (PRO-STAT SUGAR FREE 64)  30 mL Oral BID  . ferric citrate  840 mg Oral TID WC  . gabapentin  100 mg Oral BID  .  multivitamin  1 tablet Oral QHS  . nicotine  14 mg Transdermal Daily  . pantoprazole  40 mg Oral Daily  . sevelamer carbonate  2,400 mg Oral TID WC    Dialysis Orders: MWF at [facility] 4hr, 450/800, EDW 90kg, 2K/2Ca, AVF, no heparin - Mircera 13mcg IV q 2 weeks (last 6/14) - Na Thiosulfate 25g q HD  Assessment/Plan: 1. Dyspnea/volume overload: S/p extra HD 6/22 for volume. Should be on for HD today but no orders in.  Will plan HD tonight or possibly in am tomorrow depending on staffing. Likely that pt will be able to go home Tuesday.  2. Pneumonia: questionable, CXR findings likely just edema, but with productive cough and rhonchi on exam. BCx neg on IV abx.  3.  ESRD: See above, extra HD 6/22 for volume. As above will need HD today or tomorrow am.  4.  Hypotension/volume: Chronic low BP, max UF with next HD 5.  Anemia: Hgb 10.7. Due for ESA next week. 6.  Metabolic bone disease: Ca low side, current calciphylaxis wound, keep there. 7.  Nutrition: Alb low, will add supps. 8.  Hx calciphylaxis (L calf area): Will continue Na  thiosulfate with usual HD. 9.  CAD: ^ trop on admit, trending down. Per primary. 10.  Hx pacemaker 11.  Hx seizures 12.  Bipolar disorder 13.  Hx A-fib/flutter: On Eliquis.   Kelly Splinter MD [employer] pgr [phone]   12/22/2017, 1:30 PM

## 2017-12-23 LAB — CBC
HEMATOCRIT: 34.3 % — AB (ref 39.0–52.0)
HEMOGLOBIN: 10.6 g/dL — AB (ref 13.0–17.0)
MCH: 29.8 pg (ref 26.0–34.0)
MCHC: 30.9 g/dL (ref 30.0–36.0)
MCV: 96.3 fL (ref 78.0–100.0)
PLATELETS: 208 10*3/uL (ref 150–400)
RBC: 3.56 MIL/uL — ABNORMAL LOW (ref 4.22–5.81)
RDW: 15.5 % (ref 11.5–15.5)
WBC: 7.1 10*3/uL (ref 4.0–10.5)

## 2017-12-23 LAB — RENAL FUNCTION PANEL
Albumin: 2.5 g/dL — ABNORMAL LOW (ref 3.5–5.0)
Anion gap: 14 (ref 5–15)
BUN: 43 mg/dL — ABNORMAL HIGH (ref 6–20)
CHLORIDE: 98 mmol/L (ref 98–111)
CO2: 26 mmol/L (ref 22–32)
CREATININE: 6.64 mg/dL — AB (ref 0.61–1.24)
Calcium: 7.1 mg/dL — ABNORMAL LOW (ref 8.9–10.3)
GFR calc Af Amer: 10 mL/min — ABNORMAL LOW (ref >60)
GFR, EST NON AFRICAN AMERICAN: 9 mL/min — AB (ref >60)
Glucose, Bld: 71 mg/dL (ref 70–99)
POTASSIUM: 4.5 mmol/L (ref 3.5–5.1)
Phosphorus: 7 mg/dL — ABNORMAL HIGH (ref 2.5–4.6)
Sodium: 138 mmol/L (ref 135–145)

## 2017-12-23 MED ORDER — ALBUMIN HUMAN 25 % IV SOLN: 25.0000 g | Freq: Once | INTRAVENOUS | Status: DC

## 2017-12-23 MED ORDER — GUAIFENESIN ER 600 MG PO TB12: 600.0000 mg | ORAL_TABLET | Freq: Two times a day (BID) | ORAL | 0 refills | Status: AC

## 2017-12-23 MED ORDER — CEPHALEXIN 500 MG PO CAPS: 500.0000 mg | ORAL_CAPSULE | Freq: Two times a day (BID) | ORAL | 0 refills | Status: AC

## 2017-12-23 MED ORDER — VANCOMYCIN HCL IN DEXTROSE 750-5 MG/150ML-% IV SOLN
INTRAVENOUS | Status: AC
  Filled 2017-12-23: qty 150

## 2017-12-23 MED ORDER — MIDODRINE HCL 5 MG PO TABS
ORAL_TABLET | ORAL | Status: AC
  Filled 2017-12-23: qty 2

## 2017-12-23 MED ORDER — DOXYCYCLINE HYCLATE 100 MG PO TABS: 100.0000 mg | ORAL_TABLET | Freq: Two times a day (BID) | ORAL | 0 refills | Status: AC

## 2017-12-23 MED ORDER — CARVEDILOL 6.25 MG PO TABS: 6.2500 mg | ORAL_TABLET | Freq: Two times a day (BID) | ORAL | 2 refills | Status: AC

## 2017-12-23 MED ORDER — ALBUMIN HUMAN 25 % IV SOLN
INTRAVENOUS | Status: AC
  Administered 2017-12-23: 09:00:00
  Filled 2017-12-23: qty 100

## 2017-12-23 NOTE — Discharge Instructions (Signed)
1)Very low-salt diet advised 2)Weigh yourself daily, call if you gain more than 3 pounds in 1 day or more than 5 pounds in 1 week as your diuretic medications may need to be adjusted 3)Limit your Fluid  intake to no more than 60 ounces (1.8 Liters) per day 4) continue outpatient hemodialysis on Mondays Wednesdays and Fridays,  5) talk to your nephrologist about possibly extending your hemodialysis to 4 times a week

## 2017-12-23 NOTE — Progress Notes (Signed)
Pt to discharge home. After returning from dialysis, pt refused all morning and noon medications.   Ara Kussmaul BSN, RN
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
## 2017-12-23 NOTE — Progress Notes (Signed)
Pt returned from dialysis. Refused all meds. IV removed with dressing in place. Vitals stable. Reviewed discharge instructions and all questions answered. Prescriptions given and instructed to get filled at pharmacy. Pt verbalized understanding. Pt has all belongings including phone and glasses. Son at bedside transferred pt to wheelchair and escorted him off 4E. Jerald Kief, RN

## 2017-12-23 NOTE — Progress Notes (Signed)
[facility] Progress Note   Subjective:  Seen in room. S/p HD yesterday with 4L UF 6/22.  Doesn't have orders for today. SOB better, thinks weights aren't accurate.   Objective Vitals:   12/23/17 1000 12/23/17 1030 12/23/17 1100 12/23/17 1130  BP: (!) 149/80 135/78 (!) 108/55 (!) 144/80  Pulse: 82 85 82 84  Resp: 16 16 16 19   Temp:      TempSrc:      SpO2:      Weight:      Height:       Physical Exam General: Obese, chronically ill-appearing. + nasal oxygen.  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 Dialysis Access: L AVF + thrill  Additional Objective Labs: Basic Metabolic Panel: Recent Labs  Lab 12/20/17 0016 12/21/17 0909 12/23/17 0852  NA 140 140 138  K 4.2 4.3 4.5  CL 98* 99* 98  CO2 30 31 26   GLUCOSE 102* 82 71  BUN 31* 24* 43*  CREATININE 4.62* 4.15* 6.64*  CALCIUM 7.5* 7.7* 7.1*  PHOS  --   --  7.0*   CBC: Recent Labs  Lab 12/20/17 0016 12/21/17 0909 12/23/17 0852  WBC 7.4 6.1 7.1  HGB 10.1* 10.7* 10.6*  HCT 32.5* 35.0* 34.3*  MCV 96.2 96.7 96.3  PLT 241 214 208   Cardiac Enzymes: Recent Labs  Lab 12/20/17 0428 12/20/17 1027 12/20/17 1759 12/21/17 0909  TROPONINI 1.01* 0.74* 0.61* 0.53*   Studies/Results: Dg Chest 2 View  Result Date: 12/22/2017 CLINICAL DATA:  Shortness of breath. EXAM: CHEST - 2 VIEW COMPARISON:  Chest x-ray dated December 20, 2017. FINDINGS: Unchanged right chest wall pacer device. Stable cardiomegaly and pulmonary vascular congestion. Resolved interstitial edema. Unchanged elevation of the right hemidiaphragm with right infrahilar atelectasis versus infiltrate. No pleural effusion or pneumothorax. No acute osseous abnormality. IMPRESSION: 1. Unchanged right infrahilar atelectasis versus infiltrate. 2. Persistent mild pulmonary vascular  congestion. Interstitial edema appears improved. Electronically Signed   By: Titus Dubin M.D.   On: 12/22/2017 15:14   Medications: . albumin human    . piperacillin-tazobactam (ZOSYN)  IV 3.375 g (12/23/17 0517)   . albuterol  2.5 mg Nebulization Once  . apixaban  5 mg Oral BID  . aspirin  81 mg Oral Daily  . atorvastatin  80 mg Oral q1800  . carvedilol  6.25 mg Oral BID WC  . Chlorhexidine Gluconate Cloth  6 each Topical Q0600  . Chlorhexidine Gluconate Cloth  6 each Topical Q0600  . cinacalcet  90 mg Oral Q breakfast  . cloNIDine  0.2 mg Transdermal Weekly  . diphenhydrAMINE  25 mg Oral Once  . divalproex  500 mg Oral Q12H  . escitalopram  10 mg Oral Daily  . feeding supplement (PRO-STAT SUGAR FREE 64)  30 mL Oral BID  . ferric citrate  840 mg Oral TID WC  . gabapentin  100 mg Oral BID  . midodrine      . multivitamin  1 tablet Oral QHS  . nicotine  14 mg Transdermal Daily  . pantoprazole  40 mg Oral Daily  . sevelamer carbonate  2,400 mg Oral TID WC    Dialysis Orders: MWF at [facility] 4hr, 450/800, EDW 90kg, 2K/2Ca, AVF, no heparin - Mircera 60mcg IV q 2 weeks (last 6/14) - Na Thiosulfate 25g q HD  Assessment/Plan: 1. Dyspnea/volume overload: SOB resolved, still w/ some extra volume on exam.  Plan HD today w/ max UF. Should be ok for dc after HD if remains stable.  2. Pneumonia: questionable, CXR findings likely just edema, but with productive cough and rhonchi on exam. BCx neg on IV abx.  3.  ESRD: See above, extra HD 6/22 for volume. HD today postponed from yesterday. May consider raising dry wt but difficult to do w/ large wt gains.  4. Hypotension/volume: Chronic low BP, max UF with next HD 5. Anemia: Hgb 10.7. Due for ESA next week. 6. Metabolic bone disease: Ca low side, current calciphylaxis wound, keep there. 7. Nutrition: Alb low, will add supps. 8. Hx calciphylaxis (L calf area): Will continue Na thiosulfate with usual HD.  This is improving which is very  good.  9.  CAD: ^ trop on admit, trending down. Per primary. 10.  Hx pacemaker 11.  Hx seizures 12.  Bipolar disorder 13.  Hx A-fib/flutter: On Eliquis.   Kelly Splinter MD [employer] pgr [phone]   12/23/2017, 12:36 PM

## 2017-12-25 LAB — CULTURE, BLOOD (ROUTINE X 2)
CULTURE: NO GROWTH
CULTURE: NO GROWTH
Special Requests: ADEQUATE
Special Requests: ADEQUATE

## 2018-01-20 ENCOUNTER — Ambulatory Visit (INDEPENDENT_AMBULATORY_CARE_PROVIDER_SITE_OTHER): Payer: Medicare Other | Admitting: Ophthalmology

## 2018-01-20 ENCOUNTER — Encounter (INDEPENDENT_AMBULATORY_CARE_PROVIDER_SITE_OTHER): Payer: Self-pay | Admitting: Ophthalmology

## 2018-01-20 DIAGNOSIS — H25813 Combined forms of age-related cataract, bilateral: Secondary | ICD-10-CM

## 2018-01-20 DIAGNOSIS — H3589 Other specified retinal disorders: Secondary | ICD-10-CM | POA: Diagnosis not present

## 2018-01-20 DIAGNOSIS — H3581 Retinal edema: Secondary | ICD-10-CM

## 2018-01-20 DIAGNOSIS — H34232 Retinal artery branch occlusion, left eye: Secondary | ICD-10-CM | POA: Diagnosis not present

## 2018-01-20 DIAGNOSIS — H3582 Retinal ischemia: Secondary | ICD-10-CM | POA: Diagnosis not present

## 2018-01-20 DIAGNOSIS — I10 Essential (primary) hypertension: Secondary | ICD-10-CM

## 2018-01-20 DIAGNOSIS — H35033 Hypertensive retinopathy, bilateral: Secondary | ICD-10-CM
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
## 2018-02-03 ENCOUNTER — Encounter (INDEPENDENT_AMBULATORY_CARE_PROVIDER_SITE_OTHER): Payer: Medicare Other | Admitting: Ophthalmology

## 2018-03-31 DEATH — deceased

## 2018-07-26 IMAGING — CR DG FOOT COMPLETE 3+V*R*
3 series · 3 of 3 positions shown · non-contrast
Comparison: None.

CLINICAL DATA: Recent foot injury 2 weeks ago with persistent pain
and discoloration

EXAM:
RIGHT FOOT COMPLETE - 3+ VIEW

[foot ap]
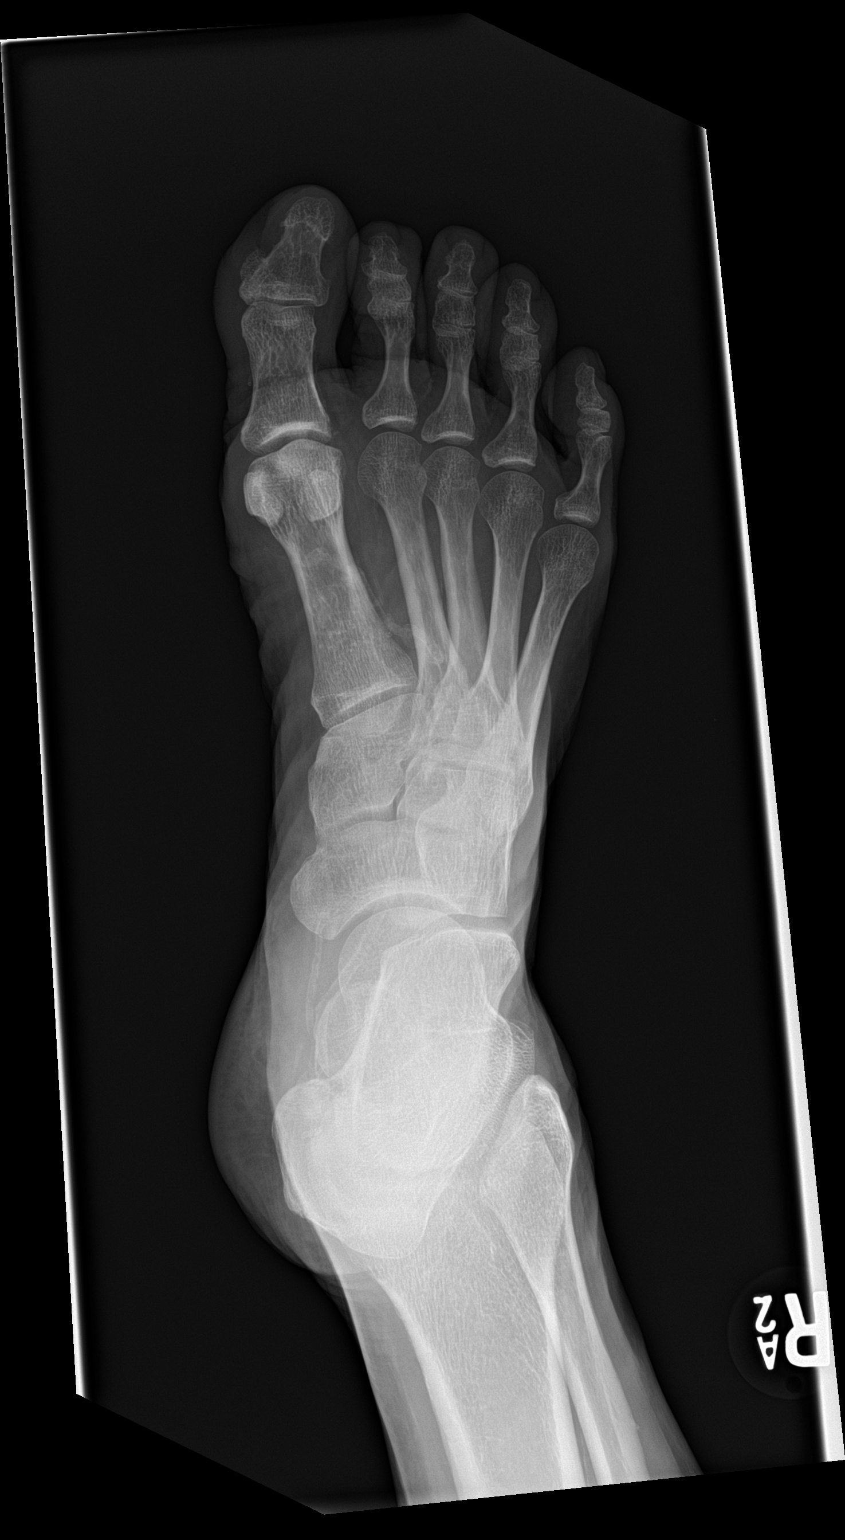

[foot obl]
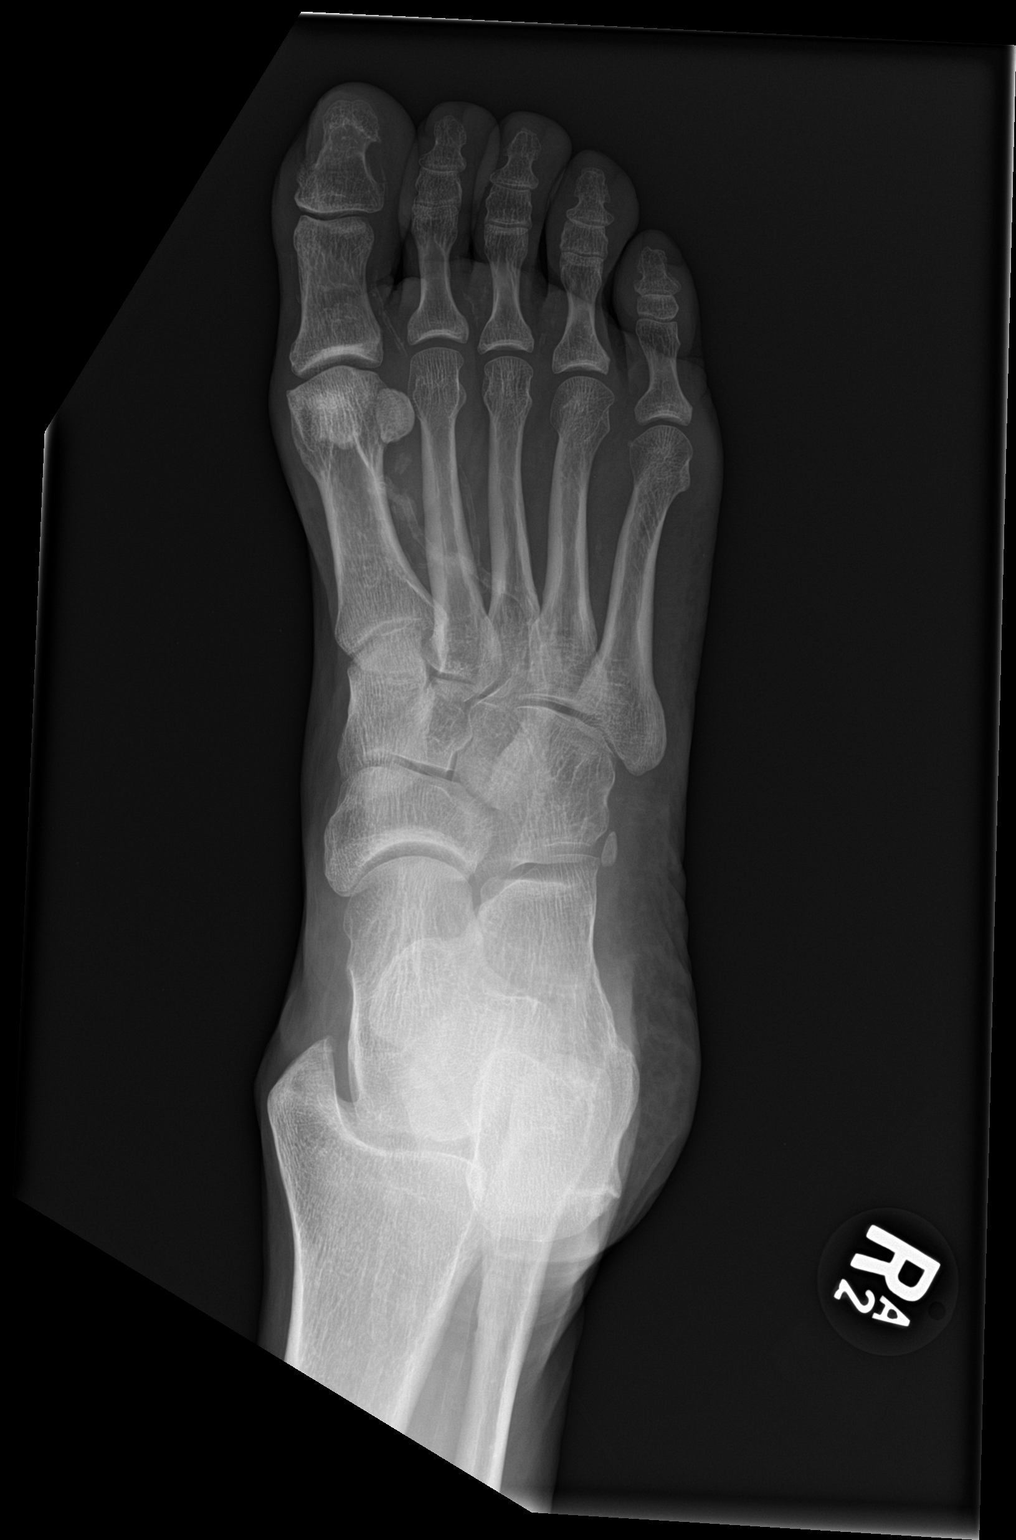

[foot lat]
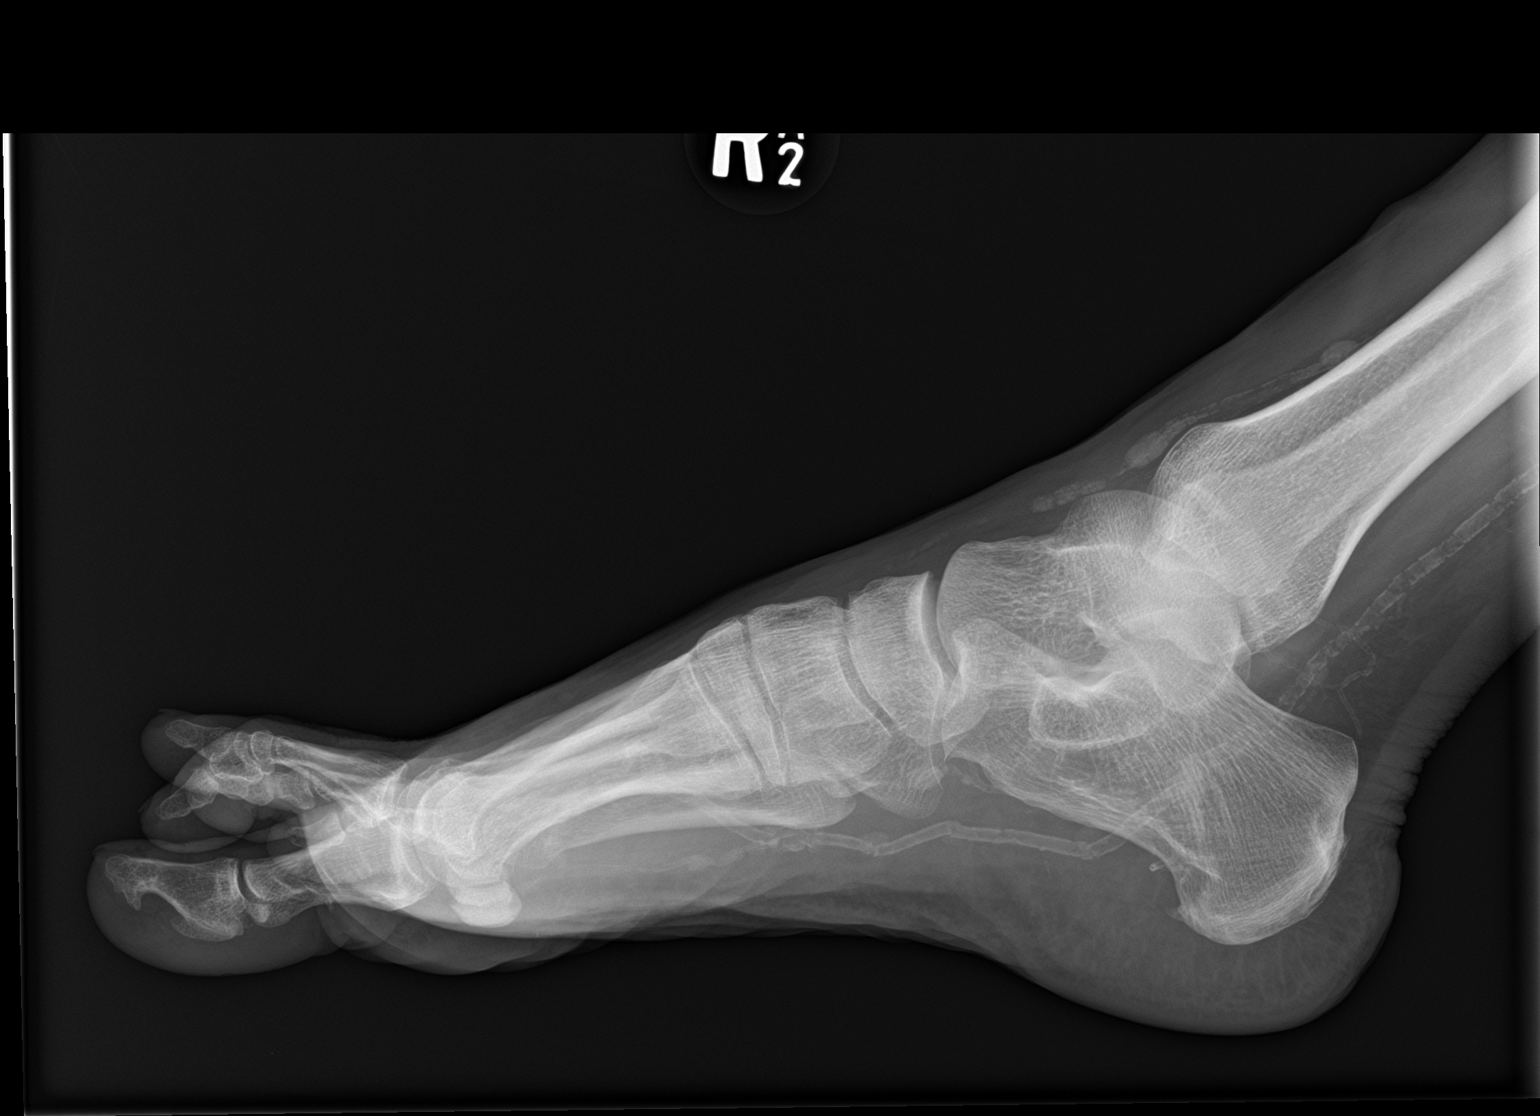

[3 of 3 positions shown; findings below may reference images not displayed]

FINDINGS: No acute fracture or dislocation is noted. Diffuse vascular
calcifications are seen.
IMPRESSION: No acute abnormality noted.

## 2018-10-23 ENCOUNTER — Telehealth: Payer: Self-pay | Admitting: Physician Assistant

## 2018-10-23 NOTE — Telephone Encounter (Signed)
Called [phone] regarding overdue device check.  Went straihght to [facility], of [employer] (on [place]), left a message with DC # to call and schedule pacer check up.  Tommye Standard, PA-C

## 2019-04-23 IMAGING — XA IR DAILY SHUNT INTRO NEEDLE/INTRACATH INITIAL W/IMG*L*
1 series · 14 of 24 positions shown · IV contrast (IODINE)
Comparison: none

INDICATION: Prolonged bleeding after dialysis.

[Series 300: dsa body · 14 of 56 slices shown]
[im 1/56]
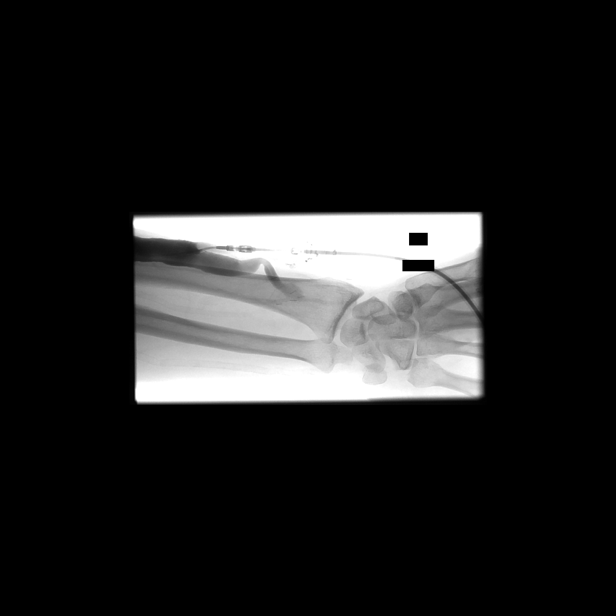
[im 5/56]
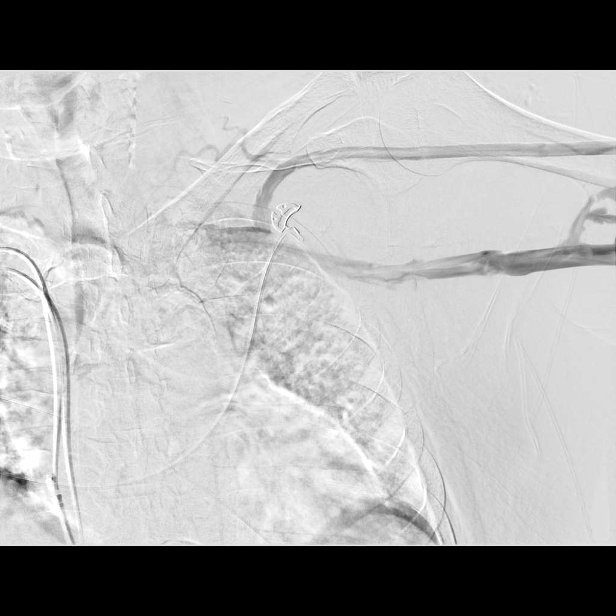
[im 10/56]
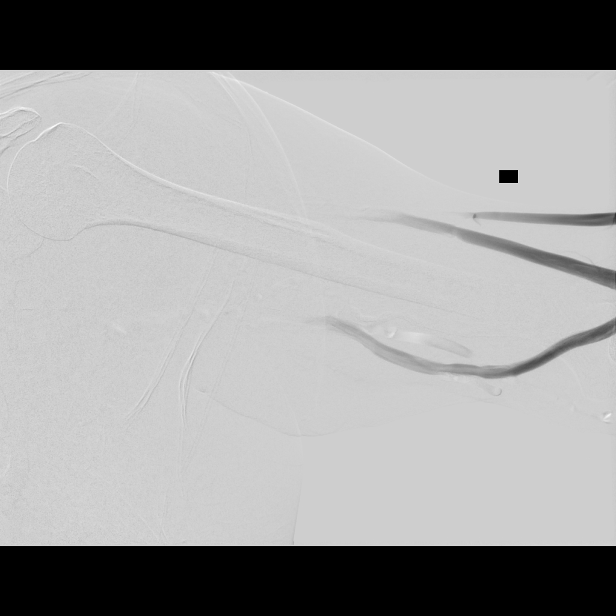
[im 15/56]
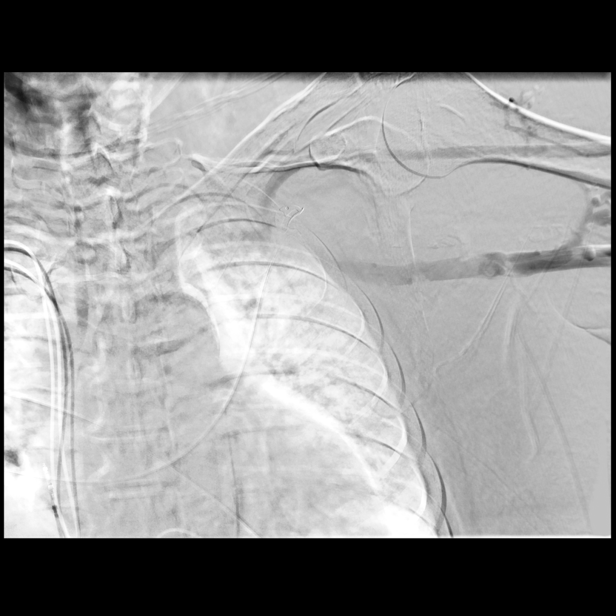
[im 17/56]
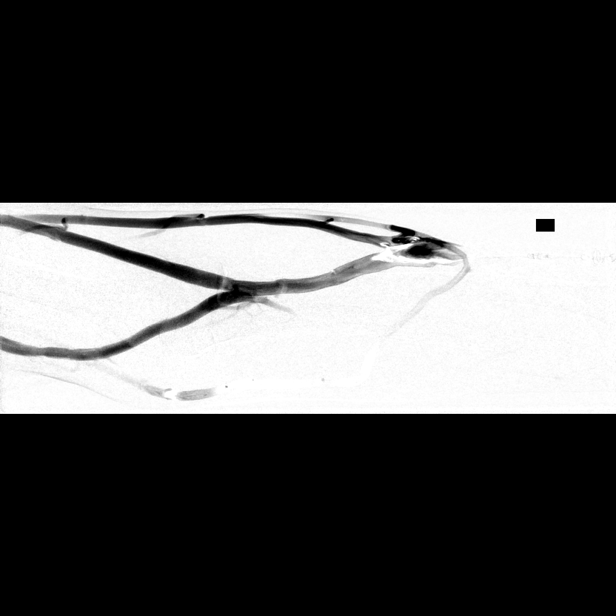
[im 22/56]
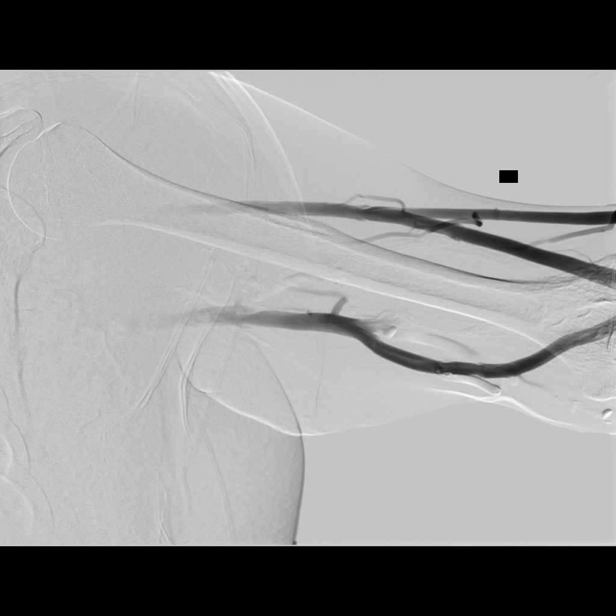
[im 27/56]
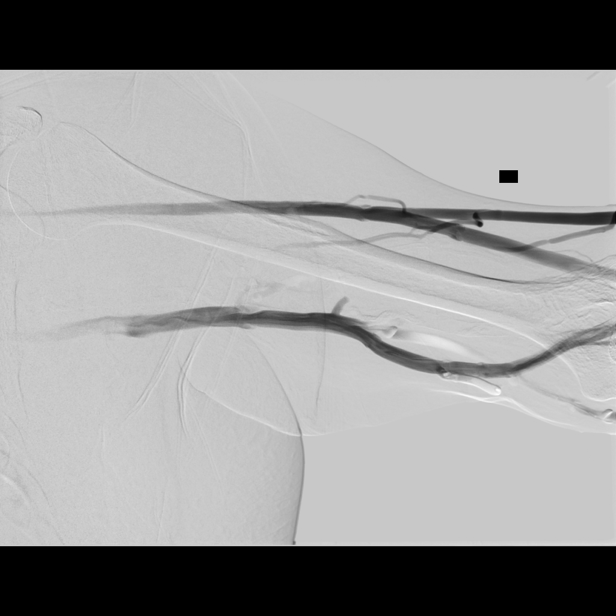
[im 29/56]
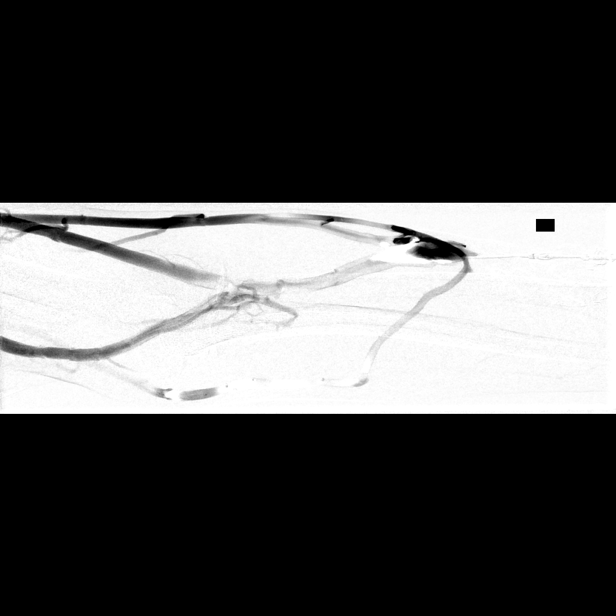
[im 34/56]
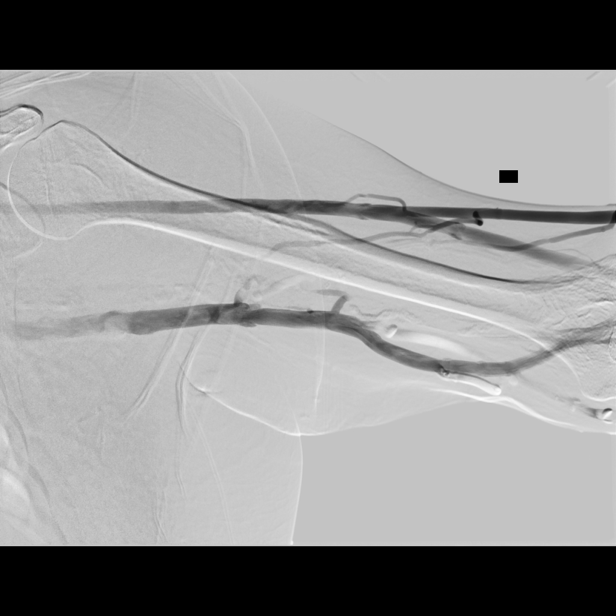
[im 39/56]
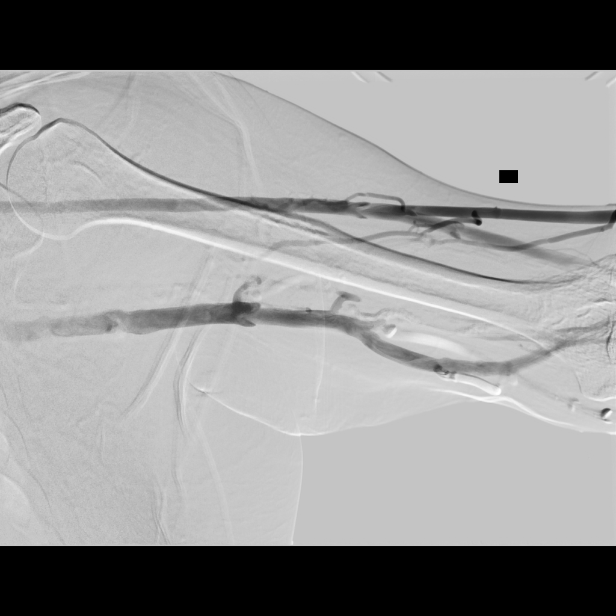
[im 44/56]
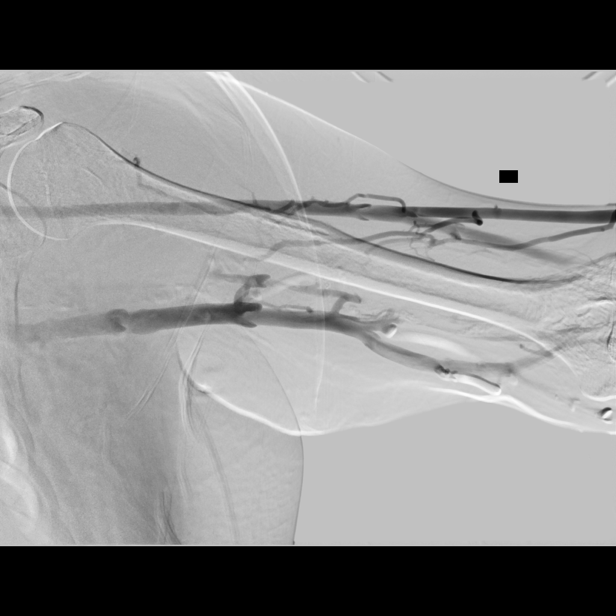
[im 46/56]
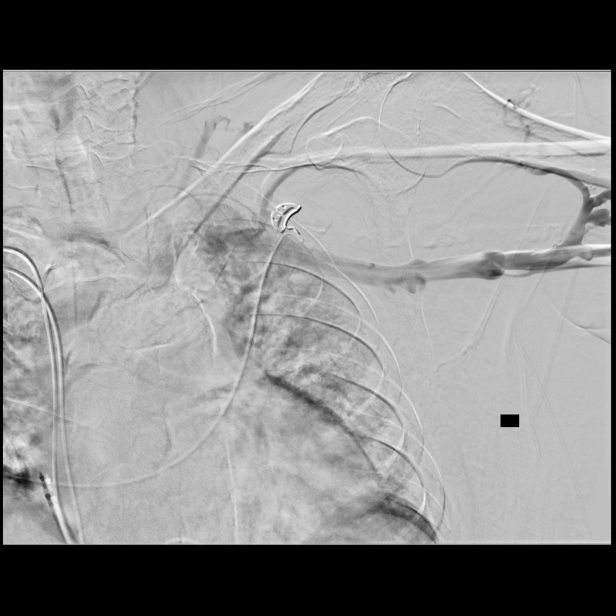
[im 51/56]
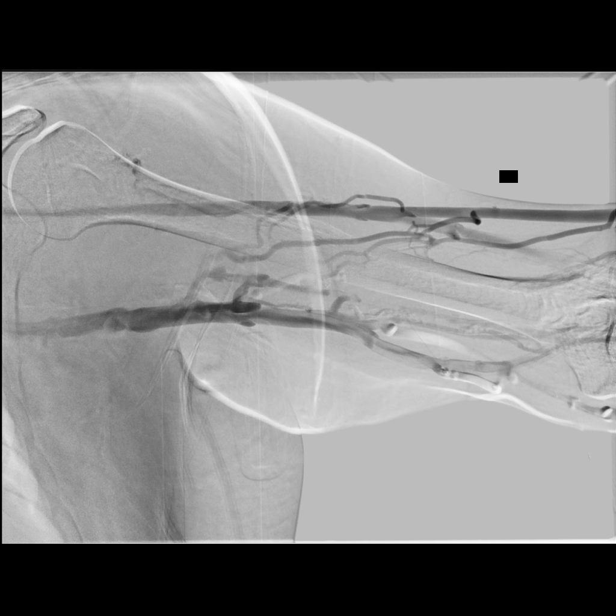
[im 56/56]
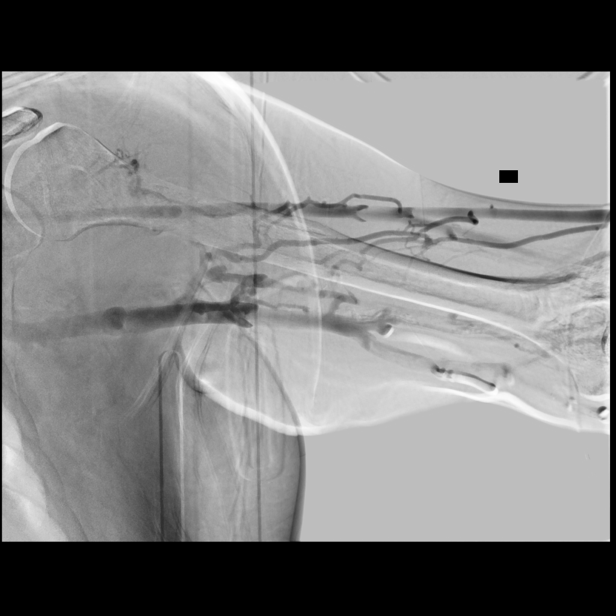

[14 of 24 positions shown; findings below may reference images not displayed]

EXAM:
LEFT WRIST AV FISTULOGRAM

MEDICATIONS:
None.  75 cc Isovue 300.

ANESTHESIA/SEDATION:
None

FLUOROSCOPY TIME:  Fluoroscopy Time:  minutes 12 seconds (102 mGy).

COMPLICATIONS:
None immediate.

PROCEDURE:
Informed written consent was obtained from the patient after a
thorough discussion of the procedural risks, benefits and
alternatives. All questions were addressed. Maximal Sterile Barrier
Technique was utilized including caps, mask, sterile gowns, sterile
gloves, sterile drape, hand hygiene and skin antiseptic. A timeout
was performed prior to the initiation of the procedure.

The left wrist was prepped and draped in a sterile fashion. An 18
gauge Angiocath was inserted into the outflow forearm vein. Contrast
was injected for imaging. The Angiocath was removed and hemostasis
was achieved with direct pressure.
FINDINGS: The wrist arteriovenous anastomosis, outflow forearm veins, upper
arm veins, and central venous structures are all widely patent.
There are multiple competing venous structures in the forearm.
IMPRESSION: Left wrist AV fistula circuit is widely patent.

ACCESS:
This access remains amenable to future percutaneous interventions as
clinically indicated.

## 2021-06-15 ENCOUNTER — Telehealth: Payer: Self-pay

## 2021-06-15 NOTE — Telephone Encounter (Signed)
LVM for patient to call device clinic back as he is lost to follow up also attempted to contact patiens daughter and son unable to LVM.
